# Patient Record
Sex: Female | Born: 1955 | Hispanic: No | Smoking: Never smoker
Health system: Southern US, Community
[De-identification: ages and names within clinical notes are randomized; demographics above are authoritative.]

## PROBLEM LIST (undated history)

## (undated) DIAGNOSIS — I671 Cerebral aneurysm, nonruptured: Secondary | ICD-10-CM

## (undated) DIAGNOSIS — Z86718 Personal history of other venous thrombosis and embolism: Secondary | ICD-10-CM

## (undated) DIAGNOSIS — I10 Essential (primary) hypertension: Secondary | ICD-10-CM

## (undated) DIAGNOSIS — Z7901 Long term (current) use of anticoagulants: Principal | ICD-10-CM

## (undated) DIAGNOSIS — D6859 Other primary thrombophilia: Secondary | ICD-10-CM

## (undated) DIAGNOSIS — E039 Hypothyroidism, unspecified: Secondary | ICD-10-CM

## (undated) DIAGNOSIS — G930 Cerebral cysts: Secondary | ICD-10-CM

## (undated) DIAGNOSIS — I639 Cerebral infarction, unspecified: Secondary | ICD-10-CM

## (undated) DIAGNOSIS — Z993 Dependence on wheelchair: Secondary | ICD-10-CM

## (undated) DIAGNOSIS — F32A Depression, unspecified: Secondary | ICD-10-CM

## (undated) DIAGNOSIS — F329 Major depressive disorder, single episode, unspecified: Secondary | ICD-10-CM

## (undated) HISTORY — PX: ABDOMINAL HYSTERECTOMY: SUR658

## (undated) HISTORY — DX: Other primary thrombophilia: D68.59

## (undated) HISTORY — DX: Long term (current) use of anticoagulants: Z79.01

## (undated) HISTORY — PX: BRAIN SURGERY: SHX531

## (undated) HISTORY — PX: TUBAL LIGATION: SHX77

## (undated) HISTORY — PX: OTHER SURGICAL HISTORY: SHX169

## (undated) HISTORY — DX: Hypothyroidism, unspecified: E03.9

## (undated) HISTORY — DX: Personal history of other venous thrombosis and embolism: Z86.718

## (undated) HISTORY — PX: THYROIDECTOMY: SHX17

---

## 1998-09-01 ENCOUNTER — Emergency Department (HOSPITAL_COMMUNITY): Admission: EM | Admit: 1998-09-01 | Discharge: 1998-09-01 | Payer: Self-pay | Admitting: Emergency Medicine

## 2003-09-05 ENCOUNTER — Inpatient Hospital Stay (HOSPITAL_COMMUNITY): Admission: RE | Admit: 2003-09-05 | Discharge: 2003-09-11 | Payer: Self-pay | Admitting: Family Medicine

## 2014-07-01 DIAGNOSIS — I639 Cerebral infarction, unspecified: Secondary | ICD-10-CM

## 2014-07-01 HISTORY — DX: Cerebral infarction, unspecified: I63.9

## 2014-11-28 ENCOUNTER — Emergency Department
Admit: 2014-11-28 | Payer: BLUE CROSS/BLUE SHIELD | Primary: Student in an Organized Health Care Education/Training Program

## 2014-11-28 ENCOUNTER — Inpatient Hospital Stay
Admit: 2014-11-28 | Payer: BLUE CROSS/BLUE SHIELD | Primary: Student in an Organized Health Care Education/Training Program

## 2014-11-28 ENCOUNTER — Inpatient Hospital Stay
Admit: 2014-11-28 | Discharge: 2014-12-12 | Disposition: A | Discharge status: Rehabilitation Facility | Payer: BLUE CROSS/BLUE SHIELD | Attending: Vascular Neurology | Admitting: Vascular Neurology

## 2014-11-28 ENCOUNTER — Inpatient Hospital Stay
Admit: 2014-11-28 | Discharge: 2014-11-28 | Disposition: A | Discharge status: Other Acute Inpatient Hospital | Payer: BLUE CROSS/BLUE SHIELD | Attending: Emergency Medicine

## 2014-11-28 DIAGNOSIS — I609 Nontraumatic subarachnoid hemorrhage, unspecified: Secondary | ICD-10-CM

## 2014-11-28 DIAGNOSIS — I6052 Nontraumatic subarachnoid hemorrhage from left vertebral artery: Principal | ICD-10-CM

## 2014-11-28 LAB — BLOOD GAS, ARTERIAL POC
Base deficit (POC): 1 mmol/L
Base deficit (POC): 6 mmol/L
Base deficit (POC): 6 mmol/L
FIO2 (POC): 100 %
FIO2 (POC): 100 %
FIO2 (POC): 60 %
HCO3 (POC): 24.7 MMOL/L (ref 22–26)
HCO3 (POC): 18.8 MMOL/L — ABNORMAL LOW (ref 22–26)
HCO3 (POC): 19.6 MMOL/L — ABNORMAL LOW (ref 22–26)
Inspiratory Time: 1 s
Inspiratory Time: 0.8 s
Inspiratory Time: 1 s
PEEP/CPAP (POC): 5 cmH2O
PEEP/CPAP (POC): 10 cmH2O
PEEP/CPAP (POC): 12 cmH2O
PIP (POC): 21
Patient temp.: 37.2
Patient temp.: 38.1
Pressure support: 10 cmH2O
Pressure support: 15 cmH2O
Set Rate: 12 {beats}/min
Set Rate: 13 {beats}/min
Set Rate: 30 {beats}/min
Tidal volume: 500 ml
Tidal volume: 500 ml
Tidal volume: 500 ml
Total resp. rate: 16
Total resp. rate: 28
Total resp. rate: 29
pCO2 (POC): 45.7 MMHG — ABNORMAL HIGH (ref 35.0–45.0)
pCO2 (POC): 31.7 MMHG — ABNORMAL LOW (ref 35.0–45.0)
pCO2 (POC): 34.6 MMHG — ABNORMAL LOW (ref 35.0–45.0)
pH (POC): 7.341 — ABNORMAL LOW (ref 7.35–7.45)
pH (POC): 7.362 (ref 7.35–7.45)
pH (POC): 7.385 (ref 7.35–7.45)
pO2 (POC): 139 MMHG — ABNORMAL HIGH (ref 80–100)
pO2 (POC): 204 MMHG — ABNORMAL HIGH (ref 80–100)
pO2 (POC): 67 MMHG — ABNORMAL LOW (ref 80–100)
sO2 (POC): 99 % — ABNORMAL HIGH (ref 92–97)
sO2 (POC): 100 % — ABNORMAL HIGH (ref 92–97)
sO2 (POC): 92 % (ref 92–97)

## 2014-11-28 LAB — URINE MICROSCOPIC ONLY
RBC: 0 /hpf (ref 0–5)
WBC: 21 /hpf (ref 0–4)

## 2014-11-28 LAB — CBC WITH AUTOMATED DIFF
ABS. BASOPHILS: 0 10*3/uL (ref 0.0–0.06)
ABS. BASOPHILS: 0 10*3/uL (ref 0.0–0.06)
ABS. EOSINOPHILS: 0.1 10*3/uL (ref 0.0–0.4)
ABS. EOSINOPHILS: 0 10*3/uL (ref 0.0–0.4)
ABS. LYMPHOCYTES: 3 10*3/uL (ref 0.9–3.6)
ABS. LYMPHOCYTES: 1.4 10*3/uL (ref 0.9–3.6)
ABS. MONOCYTES: 1.1 10*3/uL (ref 0.05–1.2)
ABS. MONOCYTES: 0.6 10*3/uL (ref 0.05–1.2)
ABS. NEUTROPHILS: 7.3 10*3/uL (ref 1.8–8.0)
ABS. NEUTROPHILS: 11.8 10*3/uL — ABNORMAL HIGH (ref 1.8–8.0)
BASOPHILS: 0 % (ref 0–2)
BASOPHILS: 0 % (ref 0–2)
EOSINOPHILS: 1 % (ref 0–5)
EOSINOPHILS: 0 % (ref 0–5)
HCT: 35.4 % (ref 35.0–45.0)
HCT: 34.3 % — ABNORMAL LOW (ref 35.0–45.0)
HGB: 11.5 g/dL — ABNORMAL LOW (ref 12.0–16.0)
HGB: 11 g/dL — ABNORMAL LOW (ref 12.0–16.0)
LYMPHOCYTES: 26 % (ref 21–52)
LYMPHOCYTES: 10 % — ABNORMAL LOW (ref 21–52)
MCH: 27.9 PG (ref 24.0–34.0)
MCH: 27.9 PG (ref 24.0–34.0)
MCHC: 32.5 g/dL (ref 31.0–37.0)
MCHC: 32.1 g/dL (ref 31.0–37.0)
MCV: 85.9 FL (ref 74.0–97.0)
MCV: 87.1 FL (ref 74.0–97.0)
MONOCYTES: 10 % (ref 3–10)
MONOCYTES: 4 % (ref 3–10)
MPV: 10.4 FL (ref 9.2–11.8)
MPV: 9.8 FL (ref 9.2–11.8)
NEUTROPHILS: 63 % (ref 40–73)
NEUTROPHILS: 86 % — ABNORMAL HIGH (ref 40–73)
PLATELET: 222 10*3/uL (ref 135–420)
PLATELET: 193 10*3/uL (ref 135–420)
RBC: 4.12 M/uL — ABNORMAL LOW (ref 4.20–5.30)
RBC: 3.94 M/uL — ABNORMAL LOW (ref 4.20–5.30)
RDW: 14.2 % (ref 11.6–14.5)
RDW: 14.6 % — ABNORMAL HIGH (ref 11.6–14.5)
WBC: 11.5 10*3/uL (ref 4.6–13.2)
WBC: 13.9 10*3/uL — ABNORMAL HIGH (ref 4.6–13.2)

## 2014-11-28 LAB — NT-PRO BNP: NT pro-BNP: 369 PG/ML (ref 0–900)

## 2014-11-28 LAB — RPR
RPR: NONREACTIVE
RPR: NONREACTIVE

## 2014-11-28 LAB — URINALYSIS W/ RFLX MICROSCOPIC
Bilirubin: NEGATIVE
Glucose: 250 mg/dL — AB
Ketone: NEGATIVE mg/dL
Nitrites: NEGATIVE
Protein: NEGATIVE mg/dL
Specific gravity: 1.03 (ref 1.005–1.030)
Urobilinogen: 0.2 EU/dL (ref 0.2–1.0)
pH (UA): 5 (ref 5.0–8.0)

## 2014-11-28 LAB — METABOLIC PANEL, COMPREHENSIVE
A-G Ratio: 0.7 — ABNORMAL LOW (ref 0.8–1.7)
ALT (SGPT): 26 U/L (ref 13–56)
AST (SGOT): 31 U/L (ref 15–37)
Albumin: 2.9 g/dL — ABNORMAL LOW (ref 3.4–5.0)
Alk. phosphatase: 120 U/L — ABNORMAL HIGH (ref 45–117)
Anion gap: 9 mmol/L (ref 3.0–18)
BUN/Creatinine ratio: 17 (ref 12–20)
BUN: 15 MG/DL (ref 7.0–18)
Bilirubin, total: 0.2 MG/DL (ref 0.2–1.0)
CO2: 23 mmol/L (ref 21–32)
Calcium: 7.2 MG/DL — ABNORMAL LOW (ref 8.5–10.1)
Chloride: 108 mmol/L (ref 100–108)
Creatinine: 0.89 MG/DL (ref 0.6–1.3)
GFR est AA: >60 mL/min/{1.73_m2} (ref >60)
GFR est non-AA: >60 mL/min/{1.73_m2} (ref >60)
Globulin: 4 g/dL (ref 2.0–4.0)
Glucose: 207 mg/dL — ABNORMAL HIGH (ref 74–99)
Potassium: 2.6 mmol/L — CL (ref 3.5–5.5)
Protein, total: 6.9 g/dL (ref 6.4–8.2)
Sodium: 140 mmol/L (ref 136–145)

## 2014-11-28 LAB — METABOLIC PANEL, BASIC
Anion gap: 10 mmol/L (ref 3.0–18)
BUN/Creatinine ratio: 18 (ref 12–20)
BUN: 21 MG/DL — ABNORMAL HIGH (ref 7.0–18)
CO2: 27 mmol/L (ref 21–32)
Calcium: 7.8 MG/DL — ABNORMAL LOW (ref 8.5–10.1)
Chloride: 106 mmol/L (ref 100–108)
Creatinine: 1.18 MG/DL (ref 0.6–1.3)
GFR est AA: 56 mL/min/{1.73_m2} — ABNORMAL LOW (ref >60)
GFR est non-AA: 47 mL/min/{1.73_m2} — ABNORMAL LOW (ref >60)
Glucose: 184 mg/dL — ABNORMAL HIGH (ref 74–99)
Potassium: 2.9 mmol/L — CL (ref 3.5–5.5)
Sodium: 143 mmol/L (ref 136–145)

## 2014-11-28 LAB — T3, FREE: Triiodothyronine (T3), free: 3.1 PG/ML (ref 2.3–4.2)

## 2014-11-28 LAB — CARDIAC PANEL,(CK, CKMB & TROPONIN)
CK - MB: 1.6 ng/ml (ref 0.5–3.6)
CK - MB: 5.4 ng/ml — ABNORMAL HIGH (ref 0.5–3.6)
CK - MB: 9.9 ng/ml — ABNORMAL HIGH (ref 0.5–3.6)
CK-MB Index: 0.8 % (ref 0.0–4.0)
CK-MB Index: 2.2 % (ref 0.0–4.0)
CK-MB Index: 2.7 % (ref 0.0–4.0)
CK: 191 U/L (ref 26–192)
CK: 244 U/L — ABNORMAL HIGH (ref 26–192)
CK: 368 U/L — ABNORMAL HIGH (ref 26–192)
Troponin-I, QT: <0.02 NG/ML (ref 0.00–0.06)
Troponin-I, QT: 0.1 NG/ML — ABNORMAL HIGH (ref 0.0–0.045)
Troponin-I, QT: 0.34 NG/ML — ABNORMAL HIGH (ref 0.0–0.045)

## 2014-11-28 LAB — PTT: aPTT: 39.9 s — ABNORMAL HIGH (ref 24.6–37.7)

## 2014-11-28 LAB — DRUG SCREEN, URINE
AMPHETAMINES: NEGATIVE
BARBITURATES: NEGATIVE
BENZODIAZEPINES: POSITIVE — AB
COCAINE: NEGATIVE
METHADONE: NEGATIVE
OPIATES: NEGATIVE
PCP(PHENCYCLIDINE): NEGATIVE
THC (TH-CANNABINOL): NEGATIVE

## 2014-11-28 LAB — AMYLASE: Amylase: 53 U/L (ref 25–115)

## 2014-11-28 LAB — AMMONIA: Ammonia, plasma: 31 umol/L (ref 11–32)

## 2014-11-28 LAB — LIPID PANEL
CHOL/HDL Ratio: 3.1 (ref 0–5.0)
Cholesterol, total: 110 MG/DL (ref <200)
HDL Cholesterol: 35 MG/DL — ABNORMAL LOW (ref 40–60)
LDL, calculated: 55.8 MG/DL (ref 0–100)
Triglyceride: 96 MG/DL (ref <150)
VLDL, calculated: 19.2 MG/DL

## 2014-11-28 LAB — CORTISOL
Cortisol, random: 16 ug/dL (ref 3.09–22.40)
Cortisol, random: 22.1 ug/dL (ref 3.09–22.40)
Cortisol, random: 21.6 ug/dL (ref 3.09–22.40)
Cortisol, random: 24 ug/dL — ABNORMAL HIGH (ref 3.09–22.40)

## 2014-11-28 LAB — HGB & HCT
HCT: 34.1 % — ABNORMAL LOW (ref 35.0–45.0)
HGB: 11.2 g/dL — ABNORMAL LOW (ref 12.0–16.0)

## 2014-11-28 LAB — HEPATIC FUNCTION PANEL
A-G Ratio: 0.7 — ABNORMAL LOW (ref 0.8–1.7)
ALT (SGPT): 25 U/L (ref 13–56)
AST (SGOT): 33 U/L (ref 15–37)
Albumin: 2.8 g/dL — ABNORMAL LOW (ref 3.4–5.0)
Alk. phosphatase: 122 U/L — ABNORMAL HIGH (ref 45–117)
Bilirubin, direct: 0.2 MG/DL (ref 0.0–0.2)
Bilirubin, total: 0.2 MG/DL (ref 0.2–1.0)
Globulin: 4.3 g/dL — ABNORMAL HIGH (ref 2.0–4.0)
Protein, total: 7.1 g/dL (ref 6.4–8.2)

## 2014-11-28 LAB — EKG, 12 LEAD, INITIAL
Atrial Rate: 77 {beats}/min
Calculated P Axis: 53 degrees
Calculated R Axis: 62 degrees
Calculated T Axis: 68 degrees
Diagnosis: NORMAL
P-R Interval: 208 ms
Q-T Interval: 450 ms
QRS Duration: 110 ms
QTC Calculation (Bezet): 509 ms
Ventricular Rate: 77 {beats}/min

## 2014-11-28 LAB — PROTHROMBIN TIME + INR
INR: 2.3 — ABNORMAL HIGH (ref 0.8–1.2)
INR: 1 (ref 0.8–1.2)
INR: 1.1 (ref 0.8–1.2)
Prothrombin time: 25.6 s — ABNORMAL HIGH (ref 11.5–15.2)
Prothrombin time: 13.4 s (ref 11.5–15.2)
Prothrombin time: 14.6 s (ref 11.5–15.2)

## 2014-11-28 LAB — POC ACTIVATED CLOTTING TIME: Activated Clotting Time (POC): 116 SECS (ref 79–138)

## 2014-11-28 LAB — PHOSPHORUS: Phosphorus: 2.6 MG/DL (ref 2.5–4.9)

## 2014-11-28 LAB — MAGNESIUM: Magnesium: 2.2 mg/dL (ref 1.8–2.4)

## 2014-11-28 LAB — T4, FREE: T4, Free: 1.2 NG/DL (ref 0.7–1.5)

## 2014-11-28 LAB — SED RATE (ESR): Sed rate, automated: 60 mm/hr — ABNORMAL HIGH (ref 0–30)

## 2014-11-28 LAB — HEMOGLOBIN A1C WITH EAG
Est. average glucose: 137 mg/dL
Hemoglobin A1c: 6.4 % — ABNORMAL HIGH (ref 4.2–5.6)

## 2014-11-28 LAB — LIPASE: Lipase: 154 U/L (ref 73–393)

## 2014-11-28 LAB — GLUCOSE, POC: Glucose (POC): 143 mg/dL — ABNORMAL HIGH (ref 70–110)

## 2014-11-28 LAB — ASPIRIN TEST: Aspirin test: 658 {ARU}

## 2014-11-28 LAB — TSH 3RD GENERATION: TSH: 0.04 u[IU]/mL — ABNORMAL LOW (ref 0.36–3.74)

## 2014-11-28 MED ORDER — LEVETIRACETAM 500 MG/5 ML IV SOLN
500 mg/5 mL | INTRAVENOUS | Status: DC
Start: 2014-11-28 — End: 2014-11-28
  Administered 2014-11-28: 08:00:00

## 2014-11-28 MED ORDER — ALBUTEROL SULFATE 0.083 % (0.83 MG/ML) SOLN FOR INHALATION
2.5 mg /3 mL (0.083 %) | RESPIRATORY_TRACT | Status: DC | PRN
Start: 2014-11-28 — End: 2014-12-12
  Administered 2014-11-28: 21:00:00 via RESPIRATORY_TRACT

## 2014-11-28 MED ORDER — PHYTONADIONE 10 MG/ML INJECTION
10 mg/mL | Freq: Once | INTRAMUSCULAR | Status: DC
Start: 2014-11-28 — End: 2014-11-28

## 2014-11-28 MED ORDER — SODIUM CHLORIDE 0.9 % IV
10 mg/mL | Freq: Once | INTRAVENOUS | Status: AC
Start: 2014-11-28 — End: 2014-11-28
  Administered 2014-11-28: 08:00:00 via INTRAVENOUS

## 2014-11-28 MED ORDER — SODIUM CHLORIDE 0.9 % IV
INTRAVENOUS | Status: DC | PRN
Start: 2014-11-28 — End: 2014-11-28
  Administered 2014-11-28: 09:00:00 via INTRAVENOUS

## 2014-11-28 MED ORDER — VECURONIUM BROMIDE 20 MG IV SOLUTION
20 mg | INTRAVENOUS | Status: AC
Start: 2014-11-28 — End: 2014-11-28
  Administered 2014-11-28: 07:00:00 via INTRAVENOUS

## 2014-11-28 MED ORDER — ACETAMINOPHEN 325 MG TABLET
325 mg | ORAL | Status: DC | PRN
Start: 2014-11-28 — End: 2014-11-30
  Administered 2014-11-29: 04:00:00 via ORAL

## 2014-11-28 MED ORDER — LABETALOL 5 MG/ML IV SYRINGE
20 mg/4 mL (5 mg/mL) | INTRAVENOUS | Status: DC | PRN
Start: 2014-11-28 — End: 2014-11-28

## 2014-11-28 MED ORDER — PHYTONADIONE 10 MG/ML INJECTION
10 mg/mL | INTRAMUSCULAR | Status: DC
Start: 2014-11-28 — End: 2014-11-28
  Administered 2014-11-28: 08:00:00

## 2014-11-28 MED ORDER — PHYTONADIONE 10 MG/ML INJECTION
10 mg/mL | INTRAMUSCULAR | Status: DC
Start: 2014-11-28 — End: 2014-11-28

## 2014-11-28 MED ORDER — ELECTROLYTE REPLACEMENT PROTOCOL
Freq: Three times a day (TID) | Status: DC
Start: 2014-11-28 — End: 2014-12-09
  Administered 2014-11-29 – 2014-12-08 (×26)

## 2014-11-28 MED ORDER — NICARDIPINE 2.5 MG/ML IV
25 mg/10 mL | INTRAVENOUS | Status: DC
Start: 2014-11-28 — End: 2014-11-28

## 2014-11-28 MED ORDER — MIDAZOLAM 1 MG/ML IN NS IV INFUSION
1 mg/mL | INTRAVENOUS | Status: DC
Start: 2014-11-28 — End: 2014-11-28
  Administered 2014-11-28: 06:00:00 via INTRAVENOUS

## 2014-11-28 MED ORDER — HEPARIN 3,000 UNITS IN NORMAL SALINE 1000 ML
3000 units/1000 | INTRAVENOUS | Status: DC
Start: 2014-11-28 — End: 2014-11-28

## 2014-11-28 MED ORDER — BACITRACIN 500 UNIT/G TOPICAL PACKET
500 unit/gram | CUTANEOUS | Status: DC | PRN
Start: 2014-11-28 — End: 2014-12-12

## 2014-11-28 MED ORDER — HEPARIN 3,000 UNITS IN NORMAL SALINE 1000 ML
3000 units/1000 | Freq: Once | INTRAVENOUS | Status: AC
Start: 2014-11-28 — End: 2014-11-28
  Administered 2014-11-28: 08:00:00

## 2014-11-28 MED ORDER — BISACODYL 5 MG TAB, DELAYED RELEASE
5 mg | Freq: Every day | ORAL | Status: DC | PRN
Start: 2014-11-28 — End: 2014-12-07

## 2014-11-28 MED ORDER — BACITRACIN 500 UNIT/G TOPICAL PACKET
500 unit/gram | CUTANEOUS | Status: AC
Start: 2014-11-28 — End: 2014-11-28
  Administered 2014-11-28: 15:00:00

## 2014-11-28 MED ORDER — ALBUTEROL SULFATE HFA 90 MCG/ACTUATION AEROSOL INHALER
90 mcg/actuation | RESPIRATORY_TRACT | Status: DC | PRN
Start: 2014-11-28 — End: 2014-11-28
  Administered 2014-11-28: 10:00:00 via RESPIRATORY_TRACT

## 2014-11-28 MED ORDER — ACETAMINOPHEN 650 MG RECTAL SUPPOSITORY
650 mg | RECTAL | Status: DC | PRN
Start: 2014-11-28 — End: 2014-12-12
  Administered 2014-11-28 – 2014-12-02 (×4): via RECTAL

## 2014-11-28 MED ORDER — DEXMEDETOMIDINE 100 MCG/ML IV SOLN
100 mcg/mL | INTRAVENOUS | Status: DC
Start: 2014-11-28 — End: 2014-12-02
  Administered 2014-11-28 – 2014-11-30 (×10): via INTRAVENOUS

## 2014-11-28 MED ORDER — SODIUM CHLORIDE 0.9 % IV
INTRAVENOUS | Status: DC
Start: 2014-11-28 — End: 2014-12-08
  Administered 2014-11-28: 16:00:00 via INTRAVENOUS

## 2014-11-28 MED ORDER — ONDANSETRON (PF) 4 MG/2 ML INJECTION
4 mg/2 mL | Freq: Four times a day (QID) | INTRAMUSCULAR | Status: DC | PRN
Start: 2014-11-28 — End: 2014-12-12

## 2014-11-28 MED ORDER — ALBUTEROL SULFATE 0.083 % (0.83 MG/ML) SOLN FOR INHALATION
2.5 mg /3 mL (0.083 %) | RESPIRATORY_TRACT | Status: DC
Start: 2014-11-28 — End: 2014-11-28

## 2014-11-28 MED ORDER — SENNOSIDES-DOCUSATE SODIUM 8.6 MG-50 MG TAB
Freq: Every evening | ORAL | Status: DC
Start: 2014-11-28 — End: 2014-12-07
  Administered 2014-11-29 – 2014-12-07 (×9): via ORAL

## 2014-11-28 MED ORDER — IODIXANOL 270 MG/ML IV SOLN
270 mg iodine/mL | Freq: Once | INTRAVENOUS | Status: AC
Start: 2014-11-28 — End: 2014-11-28
  Administered 2014-11-28: 08:00:00 via INTRA_ARTERIAL

## 2014-11-28 MED ORDER — LEVETIRACETAM IN SOD CHLORIDE (ISO-OSM) 500 MG/100 ML IV PIGGY BACK
500 mg/100 mL | Freq: Two times a day (BID) | INTRAVENOUS | Status: DC
Start: 2014-11-28 — End: 2014-11-28
  Administered 2014-11-28: 06:00:00 via INTRAVENOUS

## 2014-11-28 MED ORDER — SODIUM CHLORIDE 0.9 % IV
100 mg/mL | Freq: Every day | INTRAVENOUS | Status: DC
Start: 2014-11-28 — End: 2014-12-02
  Administered 2014-11-28 – 2014-12-02 (×5): via INTRAVENOUS

## 2014-11-28 MED ORDER — POTASSIUM CHLORIDE 20 MEQ/50 ML IV PIGGY BACK
20 mEq/50 mL | INTRAVENOUS | Status: DC
Start: 2014-11-28 — End: 2014-11-28
  Administered 2014-11-28: 12:00:00

## 2014-11-28 MED ORDER — MAGNESIUM SULFATE 2 GRAM/50 ML IVPB
2 gram/50 mL (4 %) | Freq: Two times a day (BID) | INTRAVENOUS | Status: DC
Start: 2014-11-28 — End: 2014-12-12
  Administered 2014-11-28 – 2014-12-12 (×18): via INTRAVENOUS

## 2014-11-28 MED ORDER — NIMODIPINE 30 MG CAP
30 mg | ORAL | Status: DC
Start: 2014-11-28 — End: 2014-11-29
  Administered 2014-11-29 (×2): via ORAL

## 2014-11-28 MED ORDER — IOPAMIDOL 61 % IV SOLN
300 mg iodine /mL (61 %) | Freq: Once | INTRAVENOUS | Status: AC
Start: 2014-11-28 — End: 2014-11-28
  Administered 2014-11-28: 08:00:00 via INTRAVENOUS

## 2014-11-28 MED ORDER — LIDOCAINE HCL 2 % (20 MG/ML) IJ SOLN
20 mg/mL (2 %) | INTRAMUSCULAR | Status: DC | PRN
Start: 2014-11-28 — End: 2014-11-28
  Administered 2014-11-28: 10:00:00 via SUBCUTANEOUS

## 2014-11-28 MED ORDER — VERAPAMIL 2.5 MG/ML IV
2.5 mg/mL | INTRAVENOUS | Status: DC | PRN
Start: 2014-11-28 — End: 2014-11-28

## 2014-11-28 MED ORDER — SODIUM CHLORIDE 0.9 % INJECTION
40 mg | Freq: Two times a day (BID) | INTRAMUSCULAR | Status: DC
Start: 2014-11-28 — End: 2014-12-02
  Administered 2014-11-28 – 2014-12-02 (×8): via INTRAVENOUS

## 2014-11-28 MED ORDER — SODIUM CHLORIDE 0.9 % IV
500 mg/5 mL | Freq: Two times a day (BID) | INTRAVENOUS | Status: DC
Start: 2014-11-28 — End: 2014-11-29
  Administered 2014-11-28 – 2014-11-29 (×3): via INTRAVENOUS

## 2014-11-28 MED ORDER — MAGNESIUM SULFATE 2 GRAM/50 ML IVPB
2 gram/50 mL (4 %) | INTRAVENOUS | Status: DC | PRN
Start: 2014-11-28 — End: 2014-11-28
  Administered 2014-11-28: 12:00:00 via INTRAVENOUS

## 2014-11-28 MED ORDER — PHARMACY INFORMATION NOTE
Freq: Every day | Status: DC
Start: 2014-11-28 — End: 2014-12-10
  Administered 2014-11-28 – 2014-12-08 (×11)

## 2014-11-28 MED ORDER — FUROSEMIDE 10 MG/ML IJ SOLN
10 mg/mL | INTRAMUSCULAR | Status: DC | PRN
Start: 2014-11-28 — End: 2014-11-28
  Administered 2014-11-28: 10:00:00 via INTRAVENOUS

## 2014-11-28 MED ORDER — LEVETIRACETAM IN SOD CHLORIDE (ISO-OSM) 500 MG/100 ML IV PIGGY BACK
500 mg/100 mL | INTRAVENOUS | Status: DC
Start: 2014-11-28 — End: 2014-11-28

## 2014-11-28 MED ORDER — ROCURONIUM 10 MG/ML IV
10 mg/mL | INTRAVENOUS | Status: DC | PRN
Start: 2014-11-28 — End: 2014-11-28
  Administered 2014-11-28 (×5): via INTRAVENOUS

## 2014-11-28 MED ORDER — IODIXANOL 270 MG/ML IV SOLN
270 mg iodine/mL | Freq: Once | INTRAVENOUS | Status: AC
Start: 2014-11-28 — End: 2014-11-28
  Administered 2014-11-28: 13:00:00 via INTRA_ARTERIAL

## 2014-11-28 MED ORDER — D5-NS WITH POTASSIUM CHLORIDE 40 MEQ/L IV
40 mEq/L | INTRAVENOUS | Status: DC
Start: 2014-11-28 — End: 2014-11-29
  Administered 2014-11-28 – 2014-11-29 (×2): via INTRAVENOUS

## 2014-11-28 MED ORDER — ELECTROLYTE REPLACEMENT PROTOCOL
Freq: Three times a day (TID) | Status: DC
Start: 2014-11-28 — End: 2014-12-09
  Administered 2014-11-29 – 2014-12-08 (×26)

## 2014-11-28 MED ORDER — MIDAZOLAM 1 MG/ML IJ SOLN
1 mg/mL | INTRAMUSCULAR | Status: DC
Start: 2014-11-28 — End: 2014-11-28

## 2014-11-28 MED ORDER — DEXTROSE 5% IN NORMAL SALINE IV
INTRAVENOUS | Status: DC
Start: 2014-11-28 — End: 2014-11-28
  Administered 2014-11-28: 16:00:00 via INTRAVENOUS

## 2014-11-28 MED ORDER — SUCCINYLCHOLINE CHLORIDE 20 MG/ML INJECTION
20 mg/mL | INTRAMUSCULAR | Status: DC
Start: 2014-11-28 — End: 2014-11-28

## 2014-11-28 MED ORDER — IOPAMIDOL 51 % IV SOLN
250 mg iodine /mL (51 %) | Freq: Once | INTRAVENOUS | Status: DC
Start: 2014-11-28 — End: 2014-11-28

## 2014-11-28 MED ORDER — HEPARIN 3,000 UNITS IN NORMAL SALINE 1000 ML
3000 units/1000 | Freq: Once | INTRAVENOUS | Status: AC
Start: 2014-11-28 — End: 2014-11-28
  Administered 2014-11-28: 08:00:00 via INTRAVENOUS

## 2014-11-28 MED ORDER — SODIUM CHLORIDE 0.9 % IJ SYRG
INTRAMUSCULAR | Status: DC | PRN
Start: 2014-11-28 — End: 2014-12-12
  Administered 2014-12-10: 15:00:00

## 2014-11-28 MED ORDER — SODIUM CHLORIDE 0.9 % IV
INTRAVENOUS | Status: DC | PRN
Start: 2014-11-28 — End: 2014-11-28
  Administered 2014-11-28: 12:00:00 via INTRAVENOUS

## 2014-11-28 MED ORDER — ELECTROLYTE REPLACEMENT PROTOCOL
Freq: Three times a day (TID) | Status: DC
Start: 2014-11-28 — End: 2014-12-09
  Administered 2014-11-29 – 2014-12-08 (×26)

## 2014-11-28 MED ORDER — HUM PROTHROMBIN CPLX(PCC)4FACT 500 UNIT (400-620 UNIT) INTRAVENOUS KIT
500 unit (400-620 unit) | Freq: Once | INTRAVENOUS | Status: AC
Start: 2014-11-28 — End: 2014-11-28
  Administered 2014-11-28: 07:00:00 via INTRAVENOUS

## 2014-11-28 MED ORDER — NICARDIPINE IN SODIUM CHLORIDE(ISO-OSMOTIC) 20 MG/200 ML IV PIGGY BACK
INTRAVENOUS | Status: DC | PRN
Start: 2014-11-28 — End: 2014-11-28
  Administered 2014-11-28 (×4): via INTRAVENOUS

## 2014-11-28 MED ORDER — HYDRALAZINE 20 MG/ML IJ SOLN
20 mg/mL | INTRAMUSCULAR | Status: DC
Start: 2014-11-28 — End: 2014-11-28

## 2014-11-28 MED ORDER — CEFAZOLIN 1 GRAM SOLUTION FOR INJECTION
1 gram | INTRAMUSCULAR | Status: DC | PRN
Start: 2014-11-28 — End: 2014-11-28
  Administered 2014-11-28 (×2): via INTRAVENOUS

## 2014-11-28 MED ORDER — CHLORHEXIDINE GLUCONATE 0.12 % MOUTHWASH
0.12 % | Freq: Two times a day (BID) | Status: DC
Start: 2014-11-28 — End: 2014-12-02
  Administered 2014-11-28 – 2014-12-01 (×7): via ORAL

## 2014-11-28 MED ORDER — MAGNESIUM SULFATE 50 % (4 MEQ/ML) INJECTION
4 mEq/mL (50 %) | INTRAMUSCULAR | Status: DC
Start: 2014-11-28 — End: 2014-11-28
  Administered 2014-11-28: 12:00:00

## 2014-11-28 MED ORDER — HYDRALAZINE 20 MG/ML IJ SOLN
20 mg/mL | Freq: Once | INTRAMUSCULAR | Status: AC
Start: 2014-11-28 — End: 2014-11-28
  Administered 2014-11-28: 05:00:00 via INTRAVENOUS

## 2014-11-28 MED ORDER — VECURONIUM BROMIDE 20 MG IV SOLUTION
20 mg | Freq: Once | INTRAVENOUS | Status: AC
Start: 2014-11-28 — End: 2014-11-28

## 2014-11-28 MED ORDER — LEVETIRACETAM IN SOD CHLORIDE (ISO-OSM) 1,000 MG/100 ML IV INFUSION
1000 mg/100 mL | Freq: Once | INTRAVENOUS | Status: AC
Start: 2014-11-28 — End: 2014-11-28
  Administered 2014-11-28: 08:00:00 via INTRAVENOUS

## 2014-11-28 MED ORDER — BISACODYL 10 MG RECTAL SUPPOSITORY
10 mg | Freq: Every day | RECTAL | Status: DC | PRN
Start: 2014-11-28 — End: 2014-12-07

## 2014-11-28 MED ORDER — ETOMIDATE 2 MG/ML IV SOLN
2 mg/mL | Freq: Once | INTRAVENOUS | Status: DC
Start: 2014-11-28 — End: 2014-11-28

## 2014-11-28 MED ORDER — POTASSIUM CHLORIDE 20 MEQ/50 ML IV PIGGY BACK
20 mEq/50 mL | INTRAVENOUS | Status: DC
Start: 2014-11-28 — End: 2014-11-28
  Administered 2014-11-28 (×4): via INTRAVENOUS

## 2014-11-28 MED ORDER — NICARDIPINE 2.5 MG/ML IV
2510 mg/10 mL | INTRAVENOUS | Status: DC
Start: 2014-11-28 — End: 2014-11-29
  Administered 2014-11-28: 16:00:00 via INTRAVENOUS

## 2014-11-28 MED ORDER — PROPOFOL INFUSION
10 mg/mL | INTRAVENOUS | Status: DC
Start: 2014-11-28 — End: 2014-11-28
  Administered 2014-11-28: 04:00:00 via INTRAVENOUS

## 2014-11-28 MED ORDER — NICARDIPINE 2.5 MG/ML IV
25 mg/10 mL | INTRAVENOUS | Status: DC
Start: 2014-11-28 — End: 2014-11-28
  Administered 2014-11-28 (×2): via INTRAVENOUS

## 2014-11-28 MED ORDER — HEPARIN 3,000 UNITS IN NORMAL SALINE 1000 ML
3000 units/1000 | INTRAVENOUS | Status: DC
Start: 2014-11-28 — End: 2014-11-28
  Administered 2014-11-28: 08:00:00 via INTRA_ARTERIAL

## 2014-11-28 MED ORDER — BACITRACIN 500 UNIT/G TOPICAL PACKET
500 unit/gram | CUTANEOUS | Status: DC
Start: 2014-11-28 — End: 2014-11-28

## 2014-11-28 MED ORDER — HYDRALAZINE 20 MG/ML IJ SOLN
20 mg/mL | Freq: Once | INTRAMUSCULAR | Status: AC
Start: 2014-11-28 — End: 2014-11-28
  Administered 2014-11-28: 08:00:00 via INTRAVENOUS

## 2014-11-28 MED ORDER — LABETALOL 5 MG/ML IV SOLN
5 mg/mL | INTRAVENOUS | Status: AC
Start: 2014-11-28 — End: 2014-11-28
  Administered 2014-11-28: 16:00:00

## 2014-11-28 MED ORDER — CALCIUM GLUCONATE 100 MG/ML (10%) IV SOLN
100 mg/mL (10%) | INTRAVENOUS | Status: DC
Start: 2014-11-28 — End: 2014-11-28

## 2014-11-28 MED ORDER — SODIUM CHLORIDE 0.9 % IJ SYRG
INTRAMUSCULAR | Status: DC | PRN
Start: 2014-11-28 — End: 2014-12-12

## 2014-11-28 MED ORDER — COSYNTROPIN 0.25 MG SOLUTION FOR INJECTION
0.25 mg | INTRAMUSCULAR | Status: AC
Start: 2014-11-28 — End: 2014-11-28
  Administered 2014-11-28: 21:00:00 via INTRAVENOUS

## 2014-11-28 MED ORDER — MIDAZOLAM 1 MG/ML IJ SOLN
1 mg/mL | INTRAMUSCULAR | Status: AC
Start: 2014-11-28 — End: 2014-11-28
  Administered 2014-11-28: 07:00:00 via INTRAVENOUS

## 2014-11-28 MED ORDER — LABETALOL 5 MG/ML IV SOLN
5 mg/mL | INTRAVENOUS | Status: DC | PRN
Start: 2014-11-28 — End: 2014-11-29

## 2014-11-28 MED ORDER — POTASSIUM & SODIUM PHOSPHATES 280 MG-160 MG-250 MG ORAL POWDER PACKET
280-160-250 mg | Freq: Once | ORAL | Status: AC
Start: 2014-11-28 — End: 2014-11-29

## 2014-11-28 MED ORDER — PROPOFOL INFUSION
10 mg/mL | INTRAVENOUS | Status: DC | PRN
Start: 2014-11-28 — End: 2014-11-28
  Administered 2014-11-28 (×2): via INTRAVENOUS

## 2014-11-28 MED ORDER — PHENYLEPHRINE 90 MG/250ML INFUSION
90 mg/ 250 ml | INTRAVENOUS | Status: DC
Start: 2014-11-28 — End: 2014-11-29
  Administered 2014-11-28 – 2014-11-29 (×21): via INTRAVENOUS

## 2014-11-28 MED ORDER — MAGNESIUM SULFATE 2 GRAM/50 ML IVPB
2 gram/50 mL (4 %) | Freq: Once | INTRAVENOUS | Status: DC
Start: 2014-11-28 — End: 2014-11-28
  Administered 2014-11-28: 12:00:00 via INTRAVENOUS

## 2014-11-28 MED ORDER — SODIUM CHLORIDE 0.9 % IJ SYRG
Freq: Three times a day (TID) | INTRAMUSCULAR | Status: DC
Start: 2014-11-28 — End: 2014-12-12
  Administered 2014-11-28 – 2014-12-12 (×41)

## 2014-11-28 MED ORDER — SODIUM CHLORIDE 0.9 % IV
10 mg/mL | INTRAVENOUS | Status: DC | PRN
Start: 2014-11-28 — End: 2014-11-28
  Administered 2014-11-28: 10:00:00 via INTRAVENOUS

## 2014-11-28 MED FILL — FOLIC ACID 5 MG/ML IJ SOLN: 5 mg/mL | INTRAMUSCULAR | Qty: 0.2

## 2014-11-28 MED FILL — LEVETIRACETAM 500 MG/5 ML IV SOLN: 500 mg/5 mL | INTRAVENOUS | Qty: 10

## 2014-11-28 MED FILL — PHARMACY INFORMATION NOTE: Qty: 1

## 2014-11-28 MED FILL — HYDRALAZINE 20 MG/ML IJ SOLN: 20 mg/mL | INTRAMUSCULAR | Qty: 1

## 2014-11-28 MED FILL — ELECTROLYTE REPLACEMENT PROTOCOL: Qty: 1

## 2014-11-28 MED FILL — CALCIUM GLUCONATE 100 MG/ML (10%) IV SOLN: 100 mg/mL (10%) | INTRAVENOUS | Qty: 20

## 2014-11-28 MED FILL — PROPOFOL 10 MG/ML IV EMUL: 10 mg/mL | INTRAVENOUS | Qty: 100

## 2014-11-28 MED FILL — ISOVUE-250  51 % INTRAVENOUS SOLUTION: 250 mg iodine /mL (51 %) | INTRAVENOUS | Qty: 50

## 2014-11-28 MED FILL — NICARDIPINE 2.5 MG/ML IV: 25 mg/10 mL | INTRAVENOUS | Qty: 10

## 2014-11-28 MED FILL — BACITRACIN 500 UNIT/G TOPICAL PACKET: 500 unit/gram | CUTANEOUS | Qty: 2

## 2014-11-28 MED FILL — MIDAZOLAM 1 MG/ML IN NS IV INFUSION: 1 mg/mL | INTRAVENOUS | Qty: 100

## 2014-11-28 MED FILL — HEPARIN 3,000 UNITS IN NORMAL SALINE 1000 ML: 3000 units/1000 | INTRAVENOUS | Qty: 1000

## 2014-11-28 MED FILL — SODIUM CHLORIDE 0.9 % IV: INTRAVENOUS | Qty: 1000

## 2014-11-28 MED FILL — ALBUTEROL SULFATE 0.083 % (0.83 MG/ML) SOLN FOR INHALATION: 2.5 mg /3 mL (0.083 %) | RESPIRATORY_TRACT | Qty: 1

## 2014-11-28 MED FILL — VITAMIN K1 10 MG/ML INJECTION SOLUTION: 10 mg/mL | INTRAMUSCULAR | Qty: 1

## 2014-11-28 MED FILL — LEVETIRACETAM 500 MG/5 ML IV SOLN: 500 mg/5 mL | INTRAVENOUS | Qty: 7.5

## 2014-11-28 MED FILL — POTASSIUM CHLORIDE 20 MEQ/50 ML IV PIGGY BACK: 20 mEq/50 mL | INTRAVENOUS | Qty: 150

## 2014-11-28 MED FILL — CORTROSYN 0.25 MG SOLUTION FOR INJECTION: 0.25 mg | INTRAMUSCULAR | Qty: 1

## 2014-11-28 MED FILL — SODIUM CHLORIDE 0.9 % INJECTION: INTRAMUSCULAR | Qty: 10

## 2014-11-28 MED FILL — ETOMIDATE 2 MG/ML IV SOLN: 2 mg/mL | INTRAVENOUS | Qty: 20

## 2014-11-28 MED FILL — D5-NS WITH POTASSIUM CHLORIDE 40 MEQ/L IV: 40 mEq/L | INTRAVENOUS | Qty: 1000

## 2014-11-28 MED FILL — BACITRACIN 500 UNIT/G TOPICAL PACKET: 500 unit/gram | CUTANEOUS | Qty: 1

## 2014-11-28 MED FILL — VISIPAQUE 270 MG IODINE/ML INTRAVENOUS SOLUTION: 270 mg iodine/mL | INTRAVENOUS | Qty: 200

## 2014-11-28 MED FILL — XYLOCAINE 20 MG/ML (2 %) INJECTION SOLUTION: 20 mg/mL (2 %) | INTRAMUSCULAR | Qty: 20

## 2014-11-28 MED FILL — CHLORHEXIDINE GLUCONATE 0.12 % MOUTHWASH: 0.12 % | Qty: 15

## 2014-11-28 MED FILL — LEVETIRACETAM IN SOD CHLORIDE (ISO-OSM) 500 MG/100 ML IV PIGGY BACK: 500 mg/100 mL | INTRAVENOUS | Qty: 100

## 2014-11-28 MED FILL — MAGNESIUM SULFATE 50 % (4 MEQ/ML) INJECTION: 4 mEq/mL (50 %) | INTRAMUSCULAR | Qty: 4

## 2014-11-28 MED FILL — ACEPHEN 650 MG RECTAL SUPPOSITORY: 650 mg | RECTAL | Qty: 1

## 2014-11-28 MED FILL — PRECEDEX 100 MCG/ML INTRAVENOUS SOLUTION: 100 mcg/mL | INTRAVENOUS | Qty: 4

## 2014-11-28 MED FILL — VISIPAQUE 270 MG IODINE/ML INTRAVENOUS SOLUTION: 270 mg iodine/mL | INTRAVENOUS | Qty: 100

## 2014-11-28 MED FILL — LABETALOL 5 MG/ML IV SOLN: 5 mg/mL | INTRAVENOUS | Qty: 20

## 2014-11-28 MED FILL — MIDAZOLAM 1 MG/ML IJ SOLN: 1 mg/mL | INTRAMUSCULAR | Qty: 2

## 2014-11-28 MED FILL — BD POSIFLUSH NORMAL SALINE 0.9 % INJECTION SYRINGE: INTRAMUSCULAR | Qty: 10

## 2014-11-28 MED FILL — VERAPAMIL 2.5 MG/ML IV: 2.5 mg/mL | INTRAVENOUS | Qty: 4

## 2014-11-28 MED FILL — MAGNESIUM SULFATE 2 GRAM/50 ML IVPB: 2 gram/50 mL (4 %) | INTRAVENOUS | Qty: 50

## 2014-11-28 MED FILL — VECURONIUM BROMIDE 20 MG IV SOLUTION: 20 mg | INTRAVENOUS | Qty: 20

## 2014-11-28 MED FILL — NIMODIPINE 30 MG CAP: 30 mg | ORAL | Qty: 2

## 2014-11-28 MED FILL — BD POSIFLUSH NORMAL SALINE 0.9 % INJECTION SYRINGE: INTRAMUSCULAR | Qty: 40

## 2014-11-28 MED FILL — LEVETIRACETAM IN SOD CHLORIDE (ISO-OSM) 1,000 MG/100 ML IV INFUSION: 1000 mg/100 mL | INTRAVENOUS | Qty: 100

## 2014-11-28 MED FILL — PHENYLEPHRINE 90 MG/250ML INFUSION: 90 mg/ 250 ml | INTRAVENOUS | Qty: 250

## 2014-11-28 MED FILL — BD POSIFLUSH NORMAL SALINE 0.9 % INJECTION SYRINGE: INTRAMUSCULAR | Qty: 30

## 2014-11-28 MED FILL — ISOVUE-300  61 % INTRAVENOUS SOLUTION: 300 mg iodine /mL (61 %) | INTRAVENOUS | Qty: 100

## 2014-11-28 MED FILL — SODIUM CHLORIDE 0.9 % IV: INTRAVENOUS | Qty: 250

## 2014-11-28 MED FILL — LABETALOL 5 MG/ML IV SYRINGE: 20 mg/4 mL (5 mg/mL) | INTRAVENOUS | Qty: 4

## 2014-11-28 MED FILL — MIDAZOLAM 1 MG/ML IJ SOLN: 1 mg/mL | INTRAMUSCULAR | Qty: 50

## 2014-11-28 MED FILL — DEXTROSE 5% IN NORMAL SALINE IV: INTRAVENOUS | Qty: 1000

## 2014-11-28 MED FILL — ANECTINE 20 MG/ML INJECTION SOLUTION: 20 mg/mL | INTRAMUSCULAR | Qty: 10

## 2014-11-28 MED FILL — PHOS-NAK 280 MG-160 MG-250 MG ORAL POWDER PACKET: 280-160-250 mg | ORAL | Qty: 2

## 2014-11-28 NOTE — Anesthesia Procedure Notes (Signed)
Arterial Line Placement    Start time: 11/28/2014 5:00 AM  End time: 11/28/2014 5:20 AM    Staffing  Performed by: anesthesiologist   Pre-procedure Checklist  7-step sterility protocol achieved  Timeout performed  Arterial Line Insertion  Attempts: 3 or more  Size: 20G  Location: right radialNo  Procedure Note  Arterial Line insertion    Operator:Simonne Boulos Mancel ParsonsM Arrianna Catala, MD    A time was out performed. The correct patient side, site, and procedure were verified. The left wrist was prepped with chlorhexidine.  The radial artery was punctured at 3 locations under ultrasound guidance and a catheter was unable to be threaded. After discussion of necessity with the surgeon, the right wrist was then prepped and a 20g catheter was placed over a wire into the radial artery on the first attempt.  A dressing was applied and the catheter was transduced.  There were no complications.      Kert Shackett Mancel ParsonsM Leia Coletti, MD

## 2014-11-28 NOTE — ED Notes (Signed)
Pt transported to neuro interventional suite.

## 2014-11-28 NOTE — Progress Notes (Signed)
OT order received and chart reviewed.  Talked with nursing and patient on vent and has LEs immobilized.  Will hold today and see patient tomorrow morning as appropriate.  Thank you for the referral.  Peggye FormAshley DaVanzo, MS OTR/L

## 2014-11-28 NOTE — ED Notes (Signed)
Pt moving bilateral arms and bilateral legs. Provider made aware and another dose of versed ordered to improve sedation.

## 2014-11-28 NOTE — Anesthesia Pre-Procedure Evaluation (Addendum)
Anesthetic History   No history of anesthetic complications            Review of Systems / Medical History  Patient summary reviewed, nursing notes reviewed and pertinent labs reviewed    Pulmonary        Sleep apnea           Neuro/Psych       CVA       Cardiovascular    Hypertension              Exercise tolerance: >4 METS  Comments: On coumadin for DVT   GI/Hepatic/Renal  Within defined limits              Endo/Other      Hypothyroidism  Morbid obesity     Other Findings            Physical Exam    Airway          Intubated   Cardiovascular  Regular rate and rhythm,  S1 and S2 normal,  no murmur, click, rub, or gallop  Rhythm: regular  Rate: normal         Dental         Pulmonary  Breath sounds clear to auscultation               Abdominal  GI exam deferred       Other Findings            Anesthetic Plan    ASA: 4, emergent  Anesthesia type: general    Monitoring Plan: Arterial line    Post procedure ventilation   Induction: Intravenous      No separate anesthesia consent obtained.  Emergency procedure.

## 2014-11-28 NOTE — ED Notes (Signed)
Pt to CT w/ RN

## 2014-11-28 NOTE — Progress Notes (Signed)
Speech Therapy Note:  SLP orders received and chart reviewed. Discussed with RN, Connye BurkittAlly. Patient currently on vent and has just returned from procedure. Patient is inappropriate for swallowing evaluation at this time. SLP will complete as appropriate following day.   Thank you,  Dana Allanebecca Zens M.S. CFY-SLP  Ph: [phone]

## 2014-11-28 NOTE — Progress Notes (Addendum)
2015  assumed care of [facility] from [facility] Calmer, RN. patient moving all four extremities, bilateral wrist restraints in place.  ICP 32. EVD zeroed. DHT placement verified via x-ray    8:57 PM  patient vomited a large amount of emesis, chunky undigested food. oral suction and mouth care performed.    2100  ICP now 20. paged Dr. Excell SeltzerBaker to notify of emesis. no new orders at this time    2145  called pharmacy to request phenylephrine    2200  phenylephrine now at max dose of 200 mcg    2300  paged Dr. Excell SeltzerBaker. patient is requiring increased stimulation to respond.    0000  patient increasingly restless, increased precedex will continue to monitor.  continuing to titrate phenylephrine as ordered. 650 mg tylenol given for temp 38.5.    0100  EKG Results     Procedure 720 Value Units Date/Time    EKG, 12 LEAD, SUBSEQUENT [[id number]][[id number]] Collected:  11/28/14 1701    Order Status:  Completed Updated:  11/28/14 1703     Ventricular Rate 103 BPM      Atrial Rate 103 BPM      P-R Interval 136 ms      QRS Duration 84 ms      Q-T Interval 416 ms      QTC Calculation (Bezet) 544 ms      Calculated P Axis 36 degrees      Calculated R Axis 37 degrees      Calculated T Axis 38 degrees      Diagnosis --      Result:        Sinus tachycardia  Otherwise normal ECG  When compared with ECG of 28-Nov-2014 01:09,  QRS duration has decreased      EKG, 12 LEAD, SUBSEQUENT [[id number]][[id number]]     Order Status:  Sent     EKG, 12 LEAD, SUBSEQUENT [[id number]][[id number]]     Order Status:  Sent           0400  patient became very combative in CT. precedex increased which did not have any effect. patient was moving her arms and legs and trying to sit up.   returned to room at 0448    Bedside and Verbal shift change report given to UkraineKara, Charity fundraiserN (Cabin crewoncoming nurse) by Leanne LovelyJudonne D Baxter, RN   (offgoing nurse). Report included the following information SBAR, Intake/Output, MAR and Recent Results.

## 2014-11-28 NOTE — Progress Notes (Signed)
Pt to NSICU at 1130, on monitor with RNs.  Pt as assessed unresponsive no corneals or gag, pupils equal and non-reactive.  At 1145 pupils were reactive sluggishly and by 1330  they were briskly reactive and equal.  Pt began moving right arm nonpurposly at 1215 and moving both arms weakly left < right by 1400.  Pt is now opening eyes to name not following commands at 1830.    Dr. Excell SeltzerBaker in to see patient shortly after arrival, I spoke with him by phone twice due to concerns of low b/p. Pt was on cardene for sbp > 150 from 1220 to `1300 at max of 10 then bp started to fall and cardene off. Dr. Excell SeltzerBaker aware of this.  I called him a second time due to continuing falling bp and was given order for neo which I did start at 1830 to keep sbp > 90.  Pt at 40 mcgs as ordered.  uop is excellent and all labs sent,     Reporting off to oncoming shift.

## 2014-11-28 NOTE — ED Provider Notes (Signed)
HPI Comments: 2:26 AM  Renee West is a 59 y.o. female who presents to the ED with complaints of unresponsiveness. As per EMS, Pt was found unresponsive in home. En route to [facility] pt noted to be biting tongue. At [facility], pt was intubated, ct head showing acute SAH. Pt on coumadin.   Pt given keppra  IV.     Pt transferred for NSGY management. Case discussed with Dr. Leanne Chang.       The history is provided by the EMS personnel. The history is limited by the condition of the patient.        Past Medical History:   Diagnosis Date   ??? Hypothyroid    ??? DVT (deep venous thrombosis) (HCC)    ??? Warfarin anticoagulation        Past Surgical History:   Procedure Laterality Date   ??? Hx hysterectomy     ??? Hx thyroidectomy           History reviewed. No pertinent family history.    History     Social History   ??? Marital Status: WIDOWED     Spouse Name: N/A   ??? Number of Children: N/A   ??? Years of Education: N/A     Occupational History   ??? Not on file.     Social History Main Topics   ??? Smoking status: Never Smoker    ??? Smokeless tobacco: Not on file   ??? Alcohol Use: No   ??? Drug Use: No   ??? Sexual Activity: Not on file     Other Topics Concern   ??? Not on file     Social History Narrative   ??? No narrative on file           ALLERGIES: Review of patient's allergies indicates no known allergies.      Review of Systems   Unable to perform ROS: Patient unresponsive       Filed Vitals:    11/28/14 0242 11/28/14 0325 11/28/14 0355 11/28/14 0430   BP:  130/87 150/89 139/89   Pulse:  57  65   Resp: 16   30   SpO2: 100% 100%  92%            Physical Exam   Constitutional: She appears well-developed and well-nourished.   HENT:   Head: Normocephalic and atraumatic.   Eyes:   Pupils bilateral 5:3   Neck: Neck supple. No JVD present.   Cardiovascular: Normal rate.    DP 2+ bilaterally.    Pulmonary/Chest: Effort normal. No respiratory distress.   Equal bilateral breath sounds.     Abdominal: Soft. She exhibits no distension. There is no tenderness. There is no rebound.   Musculoskeletal: She exhibits no edema.   Neurological: She is unresponsive.   Lower extremity withdraw to pain bilaterally.    Skin: Skin is warm and dry. No erythema.        MDM  Number of Diagnoses or Management Options  SAH (subarachnoid hemorrhage) [facility]):   Diagnosis management comments: Pt arrives on versed gtt at /hr    INR 2.3, given 5000u of kcentra.     Pt did not receive vit k at [facility] so will give here.   Had  of keppra, will give  here.     Reviewed labs from OSH, no indication for repeat  Check type and screen.     Started on nicardipene for htn, after bolus of hydralazine.  Ct head and cta head per Dr. Excell Seltzer.       Procedures      Medications ordered:   Medications   levETIRAcetam (KEPPRA) 500 mg/5 mL injection (1,000 mg  Canceled Entry 11/28/14 0356)   phytonadione (vitamin K1) (AQUA-MEPHYTON) 10 mg/mL injection (  Canceled Entry 11/28/14 0340)   heparinized saline 3 units/mL infusion 3,000 Units (not administered)   heparinized saline 3 units/mL infusion 3,000 Units (not administered)   heparinized saline 3 units/mL infusion (not administered)   heparinized saline 3 units/mL infusion (not administered)   iodixanol (VISIPAQUE) 270 mg iodine/mL contrast injection 50-100 mL (not administered)   iodixanol (VISIPAQUE) 270 mg iodine/mL contrast injection 100-150 mL (not administered)   heparinized saline 3 units/mL infusion (not administered)   heparinized saline 3 units/mL infusion (not administered)   iopamidol (ISOVUE 250) 51 % contrast injection 1-50 mL (not administered)   lidocaine (XYLOCAINE) 20 mg/mL (2 %) injection 20-1,000 mg (not administered)   verapamil (ISOPTIN) 2.5 mg/mL injection 2.5-5 mg (not administered)   niCARdipine (CARDENE) 25 mg in 0.9% sodium chloride 250 mL infusion (10 mg/hr IntraVENous New Bag 11/28/14 0420)    hum prothrombin cplx(PCC)29fact (KCENTRA) injection 5,000 Int'l Units (0 Int'l Units IntraVENous IV Completed 11/28/14 0340)   midazolam (VERSED) injection 2 mg (2 mg IntraVENous Given 11/28/14 0245)   phytonadione (vitamin K1) (AQUA-MEPHYTON) 10 mg in 0.9% sodium chloride 50 mL IVPB (10 mg IntraVENous New Bag 11/28/14 0340)   levETIRAcetam (KEPPRA) 1,000 mg in 100 ml IVPB (1,000 mg IntraVENous New Bag 11/28/14 0357)   vecuronium (NORCURON) injection 10 mg (0 mg IntraVENous IV Completed 11/28/14 0304)   iopamidol (ISOVUE 300) 61 % contrast injection 100 mL (75 mL IntraVENous Given 11/28/14 0348)   hydrALAZINE (APRESOLINE) 20 mg/mL injection 20 mg (20 mg IntraVENous Given 11/28/14 0412)         Lab findings:  Labs Reviewed   POC G3 - Abnormal; Notable for the following:     pH (POC) 7.341 (*)     pCO2 (POC) 45.7 (*)     pO2 (POC) 139 (*)     sO2 (POC) 99 (*)     All other components within normal limits   CULTURE, URINE   BLOOD GAS, ARTERIAL   ASPIRIN TEST   PROTHROMBIN TIME + INR   DRUG SCREEN, URINE   CARDIAC PANEL,(CK, CKMB & TROPONIN)   METABOLIC PANEL, COMPREHENSIVE   URINALYSIS W/ RFLX MICROSCOPIC   TYPE & SCREEN       EKG interpretation:    X-Ray, CT or other radiology findings or impressions:  CT HEAD WO CONT    (Results Pending)   CTA HEAD    (Results Pending)   IR UNLISTED PROCEDURE    (Results Pending)   XR CHEST PORT    (Results Pending)     cxr   ETT in appropriate position. NGT in esophagus.     Progress notes, Consult notes or additional Procedure notes:   Dr. Excell Seltzer at bedside, updated family on condition, plan for intervention as pt with [facility], active bleeding and aneurysm.     Reevaluation of patient:   I have reevaluated patient. Patient condition unchanged.     Dispo:  Patient was admitted.     Scribe Attestation:   I, F. Fermin Schwab, am scribing for and in the presence of Laurin Coder, DO on this day 11/28/2014 at 2:29 AM   Fermin Schwab, scribe    Provider Attestation:   I personally  performed the services described in the documentation, reviewed the documentation, as recorded by the scribe in my presence, and it accurately and completely records my words and actions.  Laurin CoderAlina M Grier Czerwinski, DO. 2:29 AM

## 2014-11-28 NOTE — Progress Notes (Signed)
Order received and chart reviewed patient just admitted and has both legs immobilized at this time will see tomorrow.

## 2014-11-28 NOTE — ED Notes (Addendum)
Patient arrived via EMS due to patient transitioning from unresponsive to combative, biting her tongue causing bleeding in her mouth, Coughing blood.  Patient arrived with a 18ga IV to Right AC.  Eyes open, patient not following commands.    00:20 - Patient arrived into [facility] ED Bed 6, Dr Batts, RN Lurlean Leyden, RN Anola Gurney and [facility].  Patient Very combative with significant bleeding in mouth, coughing blood.  Unable to perform NIH due to immediate Sedation/intubation to protect airway.  Placed on Monitor, pulse oximeter.  Suctioned oral secretions/blood.    00:20 - Patient given  Succinylcholine IV Push and  Etomidate IV Push as ordered per Dr. Alva Garnet, adequate sedation achieved.    00:21 - Patient intubated by Dr. Danne Harbor with 7.5 ETT, 23 cm at the lip, verified with ETCO2 detector and later with CXR, Secured with Holister ETT holder.    00:27 - Started patient on Propofol 14mcg/kg(125kg)/min per Dr. Danne Harbor  via Guardrails pump    00:30 - while prepping patient for transport to CT, patient's heartrate decreased to 30BPM, Propofol decreased to 41mcg/kg(125kg)/min, noting heart rate increase to WNL.    00:33 - heartrate increased to 69 BPM.  Patient Transported to CT with 2 RN's, Cardiac Monitor, Patient intubated being Ventilated with Ambubag at 16/min.    00:47 - Returned patient to [facility] ER Bed 6.    01:09 -  Hydralazine administered IVpush by Precious Gilding RN, for elevated BP.    01:20- OG Tube placed 36fr with no external markings. Placement confirmed with air bolus and secured to ETT by Anola Gurney RN.    01:25 - Patient Cleaned by Anola Gurney RN due to Bowel Movement.  Soft Brown Stool.      TRANSFER - OUT REPORT:    Verbal report given to Scherrie Merritts RN (name) on Renee West  being transferred to [facility] Emergency Department (unit) for urgent transfer       Report consisted of patient???s Situation, Background, Assessment and   Recommendations(SBAR).      Information from the following report(s) SBAR, MAR and Recent Results was reviewed with the receiving nurse.    Lines:   Peripheral IV 11/28/14 Left Forearm (Active)    18ga IV Right AC  18ga IV Left AC    Opportunity for questions and clarification was provided.      Patient transported with:  Portable Ventilator as patient is intubated with 7.5 ETT/23cm at Lip.      01:30 - 16Fr Foley Placed per Dr. Danne Harbor by Anola Gurney RN.  Midazolam  IV Push administered by Dorthula Rue RN per Dr. Alva Garnet.  01:34 - Patient placed on Midazolam /43ml infusion at /hr per Dr. Alva Garnet using guardrail pump.    01:35 -  of Keppra administered IVPB Via pump by Anola Gurney RN, unable to scan due to pharmacist being in chart.    01:45 - 16 Fr Oral Gastric tube advanced 8cm and secured per Dr Alva Garnet.  Report given to [facility]    02:00 - PatAlva Garnetnt Departs for [facility] Via ALS Transport with 7.5 ETT 23Cm at lip, 16 Fr Foley, 16 Fr OG Tube - Secured, 18ga IV to Left AC, 18ga IV to Right AC and a Midazolam Drip at /hr.    02:12 - Provided [facility] with updated on patient    02:20 - updated [facility] ED Charge Nurse Scherrie Merritts RN  with Critical Results of Potassium 2.9 and that transport was not delayed to give Vitamin K and K-Centra.

## 2014-11-28 NOTE — Anesthesia Procedure Notes (Signed)
Arterial Line Placement    Start time: 11/28/2014 5:00 AM  End time: 11/28/2014 5:20 AM    Staffing  Performed by: anesthesiologist   Pre-procedure Checklist  7-step sterility protocol achieved  Timeout performed  Arterial Line Insertion  Attempts: 3 or more  Size: 20G  Location: right radialNo  Procedure Note  Arterial Line insertion    Operator:Talmadge Ganas M Caylan Schifano, MD    A time was out performed. The correct patient side, site, and procedure were verified. The left wrist was prepped with chlorhexidine.  The radial artery was punctured at 3 locations under ultrasound guidance and a catheter was unable to be threaded. After discussion of necessity with the surgeon, the right wrist was then prepped and a 20g catheter was placed over a wire into the radial artery on the first attempt.  A dressing was applied and the catheter was transduced.  There were no complications.      Ishmael Berkovich M Shae Augello, MD

## 2014-11-28 NOTE — ED Notes (Signed)
Patient was found unresponsive by family.  [facility]  Patient was intubated and found to have a head bleed.

## 2014-11-28 NOTE — Progress Notes (Signed)
Chaplain conducted an initial consultation for Renee West, who is a 59 y.o.,female. However, patient was on ventilator support, with no family at bedside.  Patient???s primary language is: [place].   According to the patient???s EMR, her religious affiliation is: [employer].     The chaplain provided the following interventions:  Initiated a relationship of care and support, leaving patient's family a card with a personal note.   Provided information about Spiritual Care Services.  Offered silent prayer and, in the note, assurance of continued prayers on patient's behalf.   Chart reviewed.    Plan:  Chaplains will continue our attempts to connect with patient's family and then provide pastoral care on an as-needed/requested basis.    Chaplain Sadie Haberobyn Ruth  Board Certified Chaplain   [facility]   [phone]

## 2014-11-28 NOTE — Brief Op Note (Signed)
NEUROVASCULAR BRIEF OPERATIVE NOTE    Renee West    Date of Procedure:  11/28/2014    Surgeon:  Andrey CotaJOHN R [name], MD    Preoperative Diagnosis: SAH, hydrocephalus    Postoperative Diagnosis: same     Procedure: cerebral angiography, right frontal ventriculostomy, LCFV CVL     Anesthesia: geta    Specimens: none    Implants: none    Estimated Blood Loss: minimal    Findings:    LVA dissecting 3mm fusiform pseudoaneurysm.    LM3 dissecting 2mm pseudoaneurysm.    Blood tinged CSF with opening pressure > 32cm. ICP down to 20-25cm after draining small amount of CSF. Right frontal EVD catheter cross midline to frontal horn of right lateral ventricle.    Complications: none immediate

## 2014-11-28 NOTE — Progress Notes (Signed)
Received patient from [facility], intubated with 7.5 ETT, secure at 24 at the lip - BBS equal, End tidal CO2 reading 38 - ABG obtqained, results shown to ER physician - patient going to CT now

## 2014-11-28 NOTE — Consults (Signed)
NEUROSURGERY CONSULT NOTE    Patient: Renee West MRN: [id number]  SSN: [id number]    Date of Birth: 1956/01/25  Age: 59 y.o.  Sex: female      CONSULT DATE: 11/28/2014    REQUESTING PHYSICIAN: No referring provider defined for this encounter. .ref    CONSULTING PHYSICAIN: Ramiro Harvest, MD    REASON FOR CONSULT: SAH    HPI: This patient is a 59 y.o.  handed  African American female who presents visiting from [place] and in the middle of conversation became unresponsive at hotel. Family called 80 and she was taken to [facility]. I was called by Dr. Littie Deeds. Became combative and was intubated. CT showed diffuse SAH predominantly in posterior fossa on L.  bp controlled, keppra given, on coumadin INR 2.4, reversal agents not given until arrival here.       At this time Dr. 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 Anticoagulant causing adverse effect in therapeutic use T45.515A   ??? Malignant hypertension requiring acute intensive management I10   ??? Respiratory center failure requring intubation G93.89   ??? SAH (subarachnoid hemorrhage) (HCC) I60.9   ??? Cerebral edema (HCC) G93.6   ??? Pulmonary edema J81.1   ??? Hypoxemia R09.02   ??? Hydrocephalus G91.9   ??? 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 EVD right frontal Z98.89       No Known Allergies    Past Medical History   Diagnosis Date   ??? Hypothyroid    ??? DVT (deep venous thrombosis) ([place])    ??? Warfarin anticoagulation    ??? 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 Hydrocephalus 11/28/2014   ??? EVD right frontal 11/28/2014       Past Surgical History   Procedure Laterality Date   ??? Hx hysterectomy     ??? Hx thyroidectomy         History     Social History   ??? Marital Status: WIDOWED     Spouse Name: N/A   ??? Number of Children: N/A   ??? Years of Education: N/A     Occupational History   ??? Not on file.      Social History Main Topics   ??? Smoking status: Never Smoker    ??? Smokeless tobacco: Not on file   ??? Alcohol Use: No   ??? Drug Use: No   ??? Sexual Activity: Not on file     Other Topics Concern   ??? Not on file     Social History Narrative   ??? No narrative on file       History reviewed. No pertinent family history.    Current Facility-Administered Medications   Medication Dose Route Frequency   ??? bacitracin 500 unit/gram packet       ??? ELECTROLYTE REPLACEMENT PROTOCOL  1 Each Other Q8H   ??? ELECTROLYTE REPLACEMENT PROTOCOL  1 Each Other Q8H   ??? ELECTROLYTE REPLACEMENT PROTOCOL  1 Each Other Q8H   ??? PHARMACY INFORMATION NOTE  1 Each Other DAILY   ??? dextrose 5% and 0.9% NaCl infusion  0.64 mL/kg/hr IntraVENous CONTINUOUS   ??? chlorhexidine (PERIDEX) 0.12 % mouthwash 15 mL  15 mL Oral Q12H   ??? levETIRAcetam (KEPPRA) 750 mg in 0.9% sodium chloride IVPB  750 mg IntraVENous Q12H   ??? ondansetron (ZOFRAN)  injection 1 mg  1 mg IntraVENous Q6H PRN   ??? labetalol (NORMODYNE;TRANDATE) 20 mg/4 mL (5 mg/mL) injection 5 mg  5 mg IntraVENous Q15MIN PRN   ??? niMODipine (NIMOTOP) capsule 60 mg  60 mg Oral Q4H   ??? acetaminophen (TYLENOL) tablet 650 mg  650 mg Oral Q4H PRN   ??? acetaminophen (TYLENOL) suppository 650 mg  650 mg Rectal Q4H PRN   ??? senna-docusate (PERICOLACE) 8.6-50 mg per tablet 2 Tab  2 Tab Oral QHS   ??? bisacodyl (DULCOLAX) tablet 5 mg  5 mg Oral DAILY PRN   ??? bisacodyl (DULCOLAX) suppository 10 mg  10 mg Rectal DAILY PRN   ??? magnesium sulfate 2 g/50 ml IVPB (premix or compounded)  2 g IntraVENous Q12H   ??? pantoprazole (PROTONIX) 40 mg in sodium chloride 0.9 % 10 mL injection  40 mg IntraVENous Q12H   ??? albuterol (PROVENTIL VENTOLIN) nebulizer solution 2.5 mg  2.5 mg Nebulization V0J PRN   ??? folic acid (FOLVITE) 1 mg, thiamine (B-1) 100 mg in 0.9% sodium chloride 50 mL ivpb   IntraVENous DAILY   ??? 0.9% sodium chloride infusion  5 mL/hr IntraVENous CONTINUOUS   ??? dexmedetomidine (PRECEDEX) 400 mcg in 0.9% sodium chloride 100 mL  infusion  0-0.7 mcg/kg/hr IntraVENous TITRATE   ??? niCARdipine (CARDENE) 125 mg in 0.9% sodium chloride 250 mL infusion   IntraVENous CONTINUOUS       EXAM:      Visit Vitals   Item Reading   ??? BP 171/93 mmHg   ??? Pulse 78   ??? Resp 12   ??? SpO2 99%         Labs Reviewed:  Recent Results (from the past 24 hour(s))   CBC WITH AUTOMATED DIFF    Collection Time: 11/28/14 12:30 AM   Result Value Ref Range    WBC 11.5 4.6 - 13.2 K/uL    RBC 4.12 (L) 4.20 - 5.30 M/uL    HGB 11.5 (L) 12.0 - 16.0 g/dL    HCT 35.4 35.0 - 45.0 %    MCV 85.9 74.0 - 97.0 FL    MCH 27.9 24.0 - 34.0 PG    MCHC 32.5 31.0 - 37.0 g/dL    RDW 14.2 11.6 - 14.5 %    PLATELET 222 135 - 420 K/uL    MPV 10.4 9.2 - 11.8 FL    NEUTROPHILS 63 40 - 73 %    LYMPHOCYTES 26 21 - 52 %    MONOCYTES 10 3 - 10 %    EOSINOPHILS 1 0 - 5 %    BASOPHILS 0 0 - 2 %    ABS. NEUTROPHILS 7.3 1.8 - 8.0 K/UL    ABS. LYMPHOCYTES 3.0 0.9 - 3.6 K/UL    ABS. MONOCYTES 1.1 0.05 - 1.2 K/UL    ABS. EOSINOPHILS 0.1 0.0 - 0.4 K/UL    ABS. BASOPHILS 0.0 0.0 - 0.06 K/UL    DF AUTOMATED     PROTHROMBIN TIME + INR    Collection Time: 11/28/14 12:30 AM   Result Value Ref Range    Prothrombin time 25.6 (H) 11.5 - 15.2 sec    INR 2.3 (H) 0.8 - 1.2     PTT    Collection Time: 11/28/14 12:30 AM   Result Value Ref Range    aPTT 39.9 (H) 24.6 - 50.0 SEC   METABOLIC PANEL, BASIC    Collection Time: 11/28/14 12:30 AM   Result Value Ref Range  Sodium 143 136 - 145 mmol/L    Potassium 2.9 (LL) 3.5 - 5.5 mmol/L    Chloride 106 100 - 108 mmol/L    CO2 27 21 - 32 mmol/L    Anion gap 10 3.0 - 18 mmol/L    Glucose 184 (H) 74 - 99 mg/dL    BUN 21 (H) 7.0 - 18 MG/DL    Creatinine 1.18 0.6 - 1.3 MG/DL    BUN/Creatinine ratio 18 12 - 20      GFR est AA 56 (L) >60 ml/min/1.64m    GFR est non-AA 47 (L) >60 ml/min/1.771m   Calcium 7.8 (L) 8.5 - 10.1 MG/DL   CARDIAC PANEL,(CK, CKMB & TROPONIN)    Collection Time: 11/28/14 12:30 AM   Result Value Ref Range    CK 191 26 - 192 U/L    CK - MB 1.6 0.5 - 3.6 ng/ml     CK-MB Index 0.8 0.0 - 4.0 %    Troponin-I, Qt. <0.02 0.00 - 0.06 NG/ML   EKG, 12 LEAD, INITIAL    Collection Time: 11/28/14  1:09 AM   Result Value Ref Range    Ventricular Rate 77 BPM    Atrial Rate 77 BPM    P-R Interval 208 ms    QRS Duration 110 ms    Q-T Interval 450 ms    QTC Calculation (Bezet) 509 ms    Calculated P Axis 53 degrees    Calculated R Axis 62 degrees    Calculated T Axis 68 degrees    Diagnosis       Normal sinus rhythm  Prolonged QT  Abnormal ECG  No previous ECGs available     POC G3    Collection Time: 11/28/14  2:34 AM   Result Value Ref Range    Device: VENT      FIO2 (POC) 100 %    pH (POC) 7.341 (L) 7.35 - 7.45      pCO2 (POC) 45.7 (H) 35.0 - 45.0 MMHG    pO2 (POC) 139 (H) 80 - 100 MMHG    HCO3 (POC) 24.7 22 - 26 MMOL/L    sO2 (POC) 99 (H) 92 - 97 %    Base deficit (POC) 1 mmol/L    Mode ASSIST CONTROL      Tidal volume 500 ml    Set Rate 12 bpm    PEEP/CPAP (POC) 5 cmH2O    PIP (POC) 21      Allens test (POC) YES      Inspiratory Time 1 sec    Total resp. rate 16      Site RIGHT RADIAL      Specimen type (POC) ARTERIAL      Performed by MaGilford Raid   Volume control plus YES     TYPE & SCREEN    Collection Time: 11/28/14  4:15 AM   Result Value Ref Range    Crossmatch Expiration 12/01/2014     ABO/Rh(D) AB POSITIVE     Antibody screen NEG    DRUG SCREEN, URINE    Collection Time: 11/28/14  5:20 AM   Result Value Ref Range    BENZODIAZEPINE POSITIVE (A) NEG      BARBITURATES NEGATIVE  NEG      THC (TH-CANNABINOL) NEGATIVE  NEG      OPIATES NEGATIVE  NEG      PCP(PHENCYCLIDINE) NEGATIVE  NEG      COCAINE NEGATIVE  NEG  AMPHETAMINE NEGATIVE  NEG      METHADONE NEGATIVE       HDSCOM (NOTE)    URINALYSIS W/ RFLX MICROSCOPIC    Collection Time: 11/28/14  5:20 AM   Result Value Ref Range    Color YELLOW      Appearance CLEAR      Specific gravity 1.030 1.005 - 1.030      pH (UA) 5.0 5.0 - 8.0      Protein NEGATIVE  NEG mg/dL    Glucose 250 (A) NEG mg/dL    Ketone NEGATIVE  NEG mg/dL     Bilirubin NEGATIVE  NEG      Blood TRACE (A) NEG      Urobilinogen 0.2 0.2 - 1.0 EU/dL    Nitrites NEGATIVE  NEG      Leukocyte Esterase MODERATE (A) NEG     URINE MICROSCOPIC ONLY    Collection Time: 11/28/14  5:20 AM   Result Value Ref Range    WBC 21 to 35 0 - 4 /hpf    RBC 0 to 3 0 - 5 /hpf    Epithelial cells 2+ 0 - 5 /lpf    Bacteria 4+ (A) NEG /hpf   ASPIRIN TEST    Collection Time: 11/28/14  5:30 AM   Result Value Ref Range    Aspirin test 658 ARU   PROTHROMBIN TIME + INR    Collection Time: 11/28/14  5:30 AM   Result Value Ref Range    Prothrombin time 13.4 11.5 - 15.2 sec    INR 1.0 0.8 - 1.2     CARDIAC PANEL,(CK, CKMB & TROPONIN)    Collection Time: 11/28/14  5:30 AM   Result Value Ref Range    CK 244 (H) 26 - 192 U/L    CK - MB 5.4 (H) 0.5 - 3.6 ng/ml    CK-MB Index 2.2 0.0 - 4.0 %    Troponin-I, Qt. 0.10 (H) 0.0 - 3.086 NG/ML   METABOLIC PANEL, COMPREHENSIVE    Collection Time: 11/28/14  5:30 AM   Result Value Ref Range    Sodium 140 136 - 145 mmol/L    Potassium 2.6 (LL) 3.5 - 5.5 mmol/L    Chloride 108 100 - 108 mmol/L    CO2 23 21 - 32 mmol/L    Anion gap 9 3.0 - 18 mmol/L    Glucose 207 (H) 74 - 99 mg/dL    BUN 15 7.0 - 18 MG/DL    Creatinine 0.89 0.6 - 1.3 MG/DL    BUN/Creatinine ratio 17 12 - 20      GFR est AA >60 >60 ml/min/1.58m    GFR est non-AA >60 >60 ml/min/1.769m   Calcium 7.2 (L) 8.5 - 10.1 MG/DL    Bilirubin, total 0.2 0.2 - 1.0 MG/DL    ALT 26 13 - 56 U/L    AST 31 15 - 37 U/L    Alk. phosphatase 120 (H) 45 - 117 U/L    Protein, total 6.9 6.4 - 8.2 g/dL    Albumin 2.9 (L) 3.4 - 5.0 g/dL    Globulin 4.0 2.0 - 4.0 g/dL    A-G Ratio 0.7 (L) 0.8 - 1.7     CBC WITH AUTOMATED DIFF    Collection Time: 11/28/14  5:30 AM   Result Value Ref Range    WBC 13.9 (H) 4.6 - 13.2 K/uL    RBC 3.94 (L) 4.20 - 5.30 M/uL    HGB 11.0 (  L) 12.0 - 16.0 g/dL    HCT 34.3 (L) 35.0 - 45.0 %    MCV 87.1 74.0 - 97.0 FL    MCH 27.9 24.0 - 34.0 PG    MCHC 32.1 31.0 - 37.0 g/dL    RDW 14.6 (H) 11.6 - 14.5 %     PLATELET 193 135 - 420 K/uL    MPV 9.8 9.2 - 11.8 FL    NEUTROPHILS 86 (H) 40 - 73 %    LYMPHOCYTES 10 (L) 21 - 52 %    MONOCYTES 4 3 - 10 %    EOSINOPHILS 0 0 - 5 %    BASOPHILS 0 0 - 2 %    ABS. NEUTROPHILS 11.8 (H) 1.8 - 8.0 K/UL    ABS. LYMPHOCYTES 1.4 0.9 - 3.6 K/UL    ABS. MONOCYTES 0.6 0.05 - 1.2 K/UL    ABS. EOSINOPHILS 0.0 0.0 - 0.4 K/UL    ABS. BASOPHILS 0.0 0.0 - 0.06 K/UL    DF AUTOMATED     PRO-BNP    Collection Time: 11/28/14  5:30 AM   Result Value Ref Range    NT pro-BNP 369 0 - 900 PG/ML   POC ACTIVATED CLOTTING TIME    Collection Time: 11/28/14  5:38 AM   Result Value Ref Range    Activated Clotting Time (POC) 116 79 - 138 SECS   POC G3    Collection Time: 11/28/14  5:44 AM   Result Value Ref Range    Flow rate (POC) PENDING L/M    pH (POC) 7.234 (LL) 7.35 - 7.45      pCO2 (POC) 50.2 (H) 35.0 - 45.0 MMHG    pO2 (POC) 63 (L) 80 - 100 MMHG    HCO3 (POC) 21.2 (L) 22 - 26 MMOL/L    sO2 (POC) 87 (L) 92 - 97 %    Base deficit (POC) 6 mmol/L    Specimen type (POC) ARTERIAL      Performed by Sharmon Leyden    POC G3    Collection Time: 11/28/14  9:14 AM   Result Value Ref Range    Flow rate (POC) PENDING L/M    pH (POC) 7.235 (LL) 7.35 - 7.45      pCO2 (POC) 46.7 (H) 35.0 - 45.0 MMHG    pO2 (POC) 116 (H) 80 - 100 MMHG    HCO3 (POC) 19.8 (L) 22 - 26 MMOL/L    sO2 (POC) 98 (H) 92 - 97 %    Base deficit (POC) 8 mmol/L    Specimen type (POC) ARTERIAL      Performed by Arcelia Jew Amy    PROTHROMBIN TIME + INR    Collection Time: 11/28/14 10:12 AM   Result Value Ref Range    Prothrombin time 14.6 11.5 - 15.2 sec    INR 1.1 0.8 - 1.2         Mental Status and language function:  Intubated, sedated post procedure      LABS:    CBC   Lab Results   Component Value Date/Time    WBC 13.9* 11/28/2014 05:30 AM    RBC 3.94* 11/28/2014 05:30 AM    HCT 34.3* 11/28/2014 05:30 AM    MCV 87.1 11/28/2014 05:30 AM    MCH 27.9 11/28/2014 05:30 AM    MCHC 32.1 11/28/2014 05:30 AM    RDW 14.6* 11/28/2014 05:30 AM       Basic Metabolic Profile   Lab Results   Component  Value Date    NA 140 11/28/2014    CO2 23 11/28/2014    BUN 15 11/28/2014         Coagulation   Lab Results   Component Value Date    INR 1.1 11/28/2014    APTT 39.9* 11/28/2014           STUDIES:      IMPRESSION:   59 y.o.  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

## 2014-11-28 NOTE — ED Provider Notes (Signed)
HPI Comments: 12:14 AM Renee West is a 59 y.o. female with a history of DVT in the leg, Thyroidectomy, and Hysterectomy who presents to ED via EMS very combative with altered mental status.    Per EMS from the pt's family, the pt talking with family members while laying down the bed when she suddenly stopped talking and became unresponsive which prompted the family to contact EMS, to have the pt brought into the ED for further evaluation.     No reports of seizure activity     The pt currently takes Coumadin, Paxil and HTN medication.     PCP: No primary care provider on file.    PMHx and medications obtained after initial assessment and resuscitation from family members.    On arrival, patient in immediate need for advanced airway intervention.  RSI initiated.    The history is provided by the EMS personnel and a relative.        No past medical history on file.    No past surgical history on file.      No family history on file.    History     Social History   ??? Marital Status: WIDOWED     Spouse Name: N/A   ??? Number of Children: N/A   ??? Years of Education: N/A     Occupational History   ??? Not on file.     Social History Main Topics   ??? Smoking status: Not on file   ??? Smokeless tobacco: Not on file   ??? Alcohol Use: Not on file   ??? Drug Use: Not on file   ??? Sexual Activity: Not on file     Other Topics Concern   ??? Not on file     Social History Narrative   ??? No narrative on file           ALLERGIES: Review of patient's allergies indicates not on file.      Review of Systems   Unable to perform ROS: Mental status change       Filed Vitals:    11/28/14 0104 11/28/14 0109 11/28/14 0115 11/28/14 0130   BP: 152/94 152/97 113/98 123/78   Pulse: 67 78 80 63   Resp: 24  16 14    Weight:       SpO2: 98%  99% 100%            Physical Exam   Constitutional: She appears well-developed and well-nourished. No distress.   opverweight   HENT:   Head: Normocephalic.   Mouth/Throat: No oropharyngeal exudate.    Blood from mouth, bit tongue.       Eyes:   Pupils sluggish  exophthalmus   Neck: Normal range of motion. No JVD present. No tracheal deviation present.   Cardiovascular: Normal rate and regular rhythm.  Exam reveals no gallop and no friction rub.    No murmur heard.  Pulmonary/Chest: Effort normal and breath sounds normal. No respiratory distress. She has no wheezes. She has no rales. She exhibits no tenderness.   Abdominal: Soft. Bowel sounds are normal. She exhibits no distension.   Musculoskeletal:   Moving all extremities, combative, flailing   Neurological:   Combative, flailing extremities, not oriented   Skin: Skin is warm. No erythema.   Nursing note and vitals reviewed.       MDM  Number of Diagnoses or Management Options     Amount and/or Complexity of Data Reviewed  Clinical lab tests:  ordered  Tests in the radiology section of CPT??: reviewed  Tests in the medicine section of CPT??: ordered  Obtain history from someone other than the patient: yes  Review and summarize past medical records: yes  Discuss the patient with other providers: yes  Independent visualization of images, tracings, or specimens: yes    Risk of Complications, Morbidity, and/or Mortality  Presenting problems: high  Diagnostic procedures: high  Management options: high    Critical Care  Total time providing critical care: 75-105 minutes    Patient Progress  Patient progress: (Critical, improved)      Intubation  Consent: The procedure was performed in an emergent situation.  Date/Time: 11/28/2014 1:32 AM  Performed by: attendingIndications: airway protection  Pre-procedure re-eval: Immediately prior to the procedure, the patient was reevaluated and found suitable for the planned procedure and any planned medications.  Time out: Immediately prior to the procedure a time out was called to verify the correct patient, procedure, equipment, staff and marking as appropriate..  Intubation method: fiberoptic oral and video-assisted (glidescope)   Patient status: paralyzed (RSI)  Preoxygenation: BVM  Sedatives: etomidate (AMIDATE)  Paralytic: succinylcholine  Laryngoscope size: glidescope 4.  Tube size: 7.5 mm  Tube type: cuffed  Number of attempts: 1  Cricoid pressure: no  Cords visualized: yes  Post-procedure assessment: chest rise and CO2 detector  Breath sounds: equal and absent over the epigastrium  Cuff inflated: yes  ETT to lip: 23 cm  Chest x-ray interpreted by me.  Chest x-ray findings: endotracheal tube in appropriate position  Patient tolerance: Patient tolerated the procedure well with no immediate complications  My total time at bedside, performing this procedure was 1-15 minutes.        Pulse Oximetry:   Intubated 100%    Medications ordered:       Medications   levETIRAcetam (KEPPRA) 500 mg in 100 ml IVPB (not administered)   phytonadione (vitamin K1) (AQUA-MEPHYTON) 10 mg in 0.9% sodium chloride 50 mL IVPB (not administered)   propofol (DIPRIVAN) infusion (0 mcg/kg/min ?? 125 kg IntraVENous IV Completed 11/28/14 0135)   midazolam (VERSED) 1 mg/mL injection (not administered)   hydrALAZINE (APRESOLINE) 20 mg/mL injection (not administered)   levETIRAcetam in NaCl (iso-os) (KEPPRA) 500 mg/100 mL IVPB premix pgbk (not administered)   phytonadione (vitamin K1) (AQUA-MEPHYTON) 10 mg/mL injection (not administered)   midazolam in normal saline (VERSED) 1 mg/mL infusion (2 mg/hr IntraVENous New Bag 11/28/14 0134)   hydrALAZINE (APRESOLINE) 20 mg/mL injection 10 mg (10 mg IntraVENous Given 11/28/14 0109)         Lab findings:    Recent Results (from the past 12 hour(s))   CBC WITH AUTOMATED DIFF    Collection Time: 11/28/14 12:30 AM   Result Value Ref Range    WBC 11.5 4.6 - 13.2 K/uL    RBC 4.12 (L) 4.20 - 5.30 M/uL    HGB 11.5 (L) 12.0 - 16.0 g/dL    HCT 16.135.4 09.635.0 - 04.545.0 %    MCV 85.9 74.0 - 97.0 FL    MCH 27.9 24.0 - 34.0 PG    MCHC 32.5 31.0 - 37.0 g/dL    RDW 40.914.2 81.111.6 - 91.414.5 %    PLATELET 222 135 - 420 K/uL    MPV 10.4 9.2 - 11.8 FL     NEUTROPHILS 63 40 - 73 %    LYMPHOCYTES 26 21 - 52 %    MONOCYTES 10 3 - 10 %    EOSINOPHILS 1  0 - 5 %    BASOPHILS 0 0 - 2 %    ABS. NEUTROPHILS 7.3 1.8 - 8.0 K/UL    ABS. LYMPHOCYTES 3.0 0.9 - 3.6 K/UL    ABS. MONOCYTES 1.1 0.05 - 1.2 K/UL    ABS. EOSINOPHILS 0.1 0.0 - 0.4 K/UL    ABS. BASOPHILS 0.0 0.0 - 0.06 K/UL    DF AUTOMATED     PROTHROMBIN TIME + INR    Collection Time: 11/28/14 12:30 AM   Result Value Ref Range    Prothrombin time 25.6 (H) 11.5 - 15.2 sec    INR 2.3 (H) 0.8 - 1.2     PTT    Collection Time: 11/28/14 12:30 AM   Result Value Ref Range    aPTT 39.9 (H) 24.6 - 37.7 SEC   METABOLIC PANEL, BASIC    Collection Time: 11/28/14 12:30 AM   Result Value Ref Range    Sodium 143 136 - 145 mmol/L    Potassium 2.9 (LL) 3.5 - 5.5 mmol/L    Chloride 106 100 - 108 mmol/L    CO2 27 21 - 32 mmol/L    Anion gap 10 3.0 - 18 mmol/L    Glucose 184 (H) 74 - 99 mg/dL    BUN 21 (H) 7.0 - 18 MG/DL    Creatinine 2.44 0.6 - 1.3 MG/DL    BUN/Creatinine ratio 18 12 - 20      GFR est AA 56 (L) >60 ml/min/1.82m2    GFR est non-AA 47 (L) >60 ml/min/1.68m2    Calcium 7.8 (L) 8.5 - 10.1 MG/DL   CARDIAC PANEL,(CK, CKMB & TROPONIN)    Collection Time: 11/28/14 12:30 AM   Result Value Ref Range    CK 191 26 - 192 U/L    CK - MB 1.6 0.5 - 3.6 ng/ml    CK-MB Index 0.8 0.0 - 4.0 %    Troponin-I, Qt. <0.02 0.00 - 0.06 NG/ML   EKG, 12 LEAD, INITIAL    Collection Time: 11/28/14  1:09 AM   Result Value Ref Range    Ventricular Rate 77 BPM    Atrial Rate 77 BPM    P-R Interval 208 ms    QRS Duration 110 ms    Q-T Interval 450 ms    QTC Calculation (Bezet) 509 ms    Calculated P Axis 53 degrees    Calculated R Axis 62 degrees    Calculated T Axis 68 degrees    Diagnosis       Normal sinus rhythm  Prolonged QT  Abnormal ECG  No previous ECGs available         EKG interpretation:    :  NSR@77 , prolonged QT, no stemi.    X-Ray, CT or other radiology findings or impressions:        CT HEAD WO CONT    Final Result   Per radiology, IMPRESSION: 1. Diffuse acute subarachnoid hemorrhage. 2. Diffuse effacement of sulci involving both cerebral hemispheres. Suggestive of developing diffuse brain edema.      XR CHEST SNGL V    (Results Pending)   ET tube in place.  OG tube short,  Advanced  8cm.    Consult notes or additional Procedure notes:   1:02 AM.Consult: Discussed care with Dr. Su Ley, Neurosurgery at [facility] Standard discussion; including history of patient???s chief complaint, available diagnostic results, and treatment course. Transfer to ER.  BP control with systolic target <120.      1:05 AM.  Consult:  Discussed care with Dr. Roetta Sessions, ER Physician at [facility] Standard discussion; including history of patient???s chief complaint, available diagnostic results, and treatment course. Will accept the pt.  1:20 AM. Consult: Discussed care with Dr. Su Ley, Neurosurgery at [facility]. He is aware the pt is currently on Coumadin.   1:22 AM. Consult: Discussed care with Dr. Noreene Filbert, ER Physician at [facility]. Will get 2 units of FFP ready for the pt.  1:27 AM. From chest XR, tube in place, OG tube is being advanced approximately 7cm from position as seen on the chest XR.    Hydralazine IV given.    Patient experienced episodes of bradycardia down to mid 30's on propofol, changed to Versed.  Because of bradycardia, labetalol not used.      KEPPRA IVPB given.    Vitamin K ordered but NOT GIVEN because all IV lines currently in use.    This center does NOT have Kcentra available.    Reevaluation of patient:     Diagnosis:   1. Subarachnoid hemorrhage (HCC)    2. Altered mental status, unspecified altered mental status type    3. Hypokalemia    4. Elevated international normalized ratio (INR)          Discharge/Disposition:  Patient was  Emergently transferred to [facility] ER to ER transfer, Dr. Roetta Sessions accepting.  Dr. Cyndie Chime, neurosurgeon involved in tranfer and aware of patient.        Scribe Attestation:    [employer] as a Neurosurgeon for and in the presence of Dr. Rona Ravens, MD Nov 28, 2014 at 12:14 AM       Provider Attestation:   I personally performed the services described in the documentation, reviewed the documentation, as recorded by the scribe in my presence, and it accurately and completely records my words and actions.     Reviewed and signed by:  Dr. Rona Ravens, MD

## 2014-11-28 NOTE — Other (Signed)
ICU Multidisciplinary Rounds  Events noted; needs restraint for safety. Ordered.   FiO2 100 PEEP 12 -> spo2 99%. CXR probably neurogenic pulm edema.         Should be able to wean some now. I will initiate some of this.  PCCM available as needed. Please call for questions or concerns.   -dar [phone]

## 2014-11-28 NOTE — Anesthesia Post-Procedure Evaluation (Signed)
Post-Anesthesia Evaluation & Assessment    Visit Vitals   Item Reading   ??? BP 93/65 mmHg   ??? Pulse 102   ??? Temp 35.2 ??C (95.4 ??F)   ??? Resp 13   ??? SpO2 100%       Nausea/Vomiting controlled    Post-operative hydration adequate.    Mental status & Level of consciousness: opens eyes to voice    Neurological status: moves left upper extremity nonpurposefully    Pulmonary status: on vent    Complications related to anesthesia: none    Patient has met all discharge requirements.    Additional comments:        Tunisia Landgrebe Dois Davenport, CRNA

## 2014-11-28 NOTE — H&P (Signed)
Neurovascular    Inpatient History & Physical        Patient: Renee West MRN: [id number]  SSN: [id number]    Date of Birth: 03/15/1956  Age: 59 y.o.  Sex: female        ID: 59 year-old right-handed female on warfarin anticoagulation for DVT presents with  CC: unresponsive then combative with respiratory distress requiring intubation found to have SAH.    HPI:  Dr. Su Ley called me about this patient in the [facility] ED.      The patient and her family were visiting this area from [place] for the holiday weekend.  They were staying together in a suite.  Her niece was talking with the patient through a divider between the sleeping areas.  The patient suddenly stopped participating in the conversation.  When the family went to see her she was sitting on the edge of the bed double forwarded at the waist..  She did not respond to verbal or tactile stimulation.  There was blood coming from her mouth.  They called 911.  The patient was brought to the [facility] ED.    The patient became more aroused and was combative but not following commands and had respiratory distress.  She was intubated.  CT showed diffuse SAH especially in the posterior fossa. The ventricles were small with sulcal crowding consistent with cerebral edema.  Blood pressure control was initiated.  Dr. Su Ley was contacted.  INR 2.3 on warfarin for DVT.  Sedation maintained with midazolam. Levetiracetam was initiated.  Reversal agents were not available prior to her transfer to the [facility] ED.    She was given [facility] in the [facility] ED as well as vitamin k iv.  She was not following commands but moving during attempted CT.  Chemical paralytics given in addition to sedation.  Repeat CT did not reveal any definitive recurrent hemorrhage but the temporal horns of the lateral ventricles, third ventricle, aqueduct of Sylvius, and fourth ventricle were larger.  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          Past Medical History    Diagnosis Date   ??? Hypothyroid    ??? DVT (deep venous thrombosis) (HCC)    ??? Warfarin anticoagulation         Past Surgical History   Procedure Laterality Date   ??? Hx hysterectomy     ??? Hx thyroidectomy         History     Social History   ??? Marital Status: WIDOWED     Spouse Name: N/A   ??? Number of Children: N/A   ??? Years of Education: N/A     Occupational History   ??? Not on file.     Social History Main Topics   ??? Smoking status: Never Smoker    ??? Smokeless tobacco: Not on file   ??? Alcohol Use: No   ??? Drug Use: No   ??? Sexual Activity: Not on file     Other Topics Concern   ??? Not on file     Social History Narrative   ??? No narrative on file        History reviewed. No pertinent family history.     No Known Allergies     Current Facility-Administered Medications   Medication Dose Route Frequency   ??? levETIRAcetam (KEPPRA) 500 mg/5 mL injection       ??? phytonadione (vitamin K1) (AQUA-MEPHYTON) 10 mg/mL injection       ???  heparinized saline 3 units/mL infusion 3,000 Units  1,000 mL Irrigation ONCE   ??? heparinized saline 3 units/mL infusion 3,000 Units  1,000 mL IntraVENous ONCE   ??? heparinized saline 3 units/mL infusion  20 mL/hr IntraarTERial CONTINUOUS   ??? heparinized saline 3 units/mL infusion  20 mL/hr IntraarTERial CONTINUOUS   ??? iodixanol (VISIPAQUE) 270 mg iodine/mL contrast injection 50-100 mL  50-100 mL IntraarTERial ONCE   ??? iodixanol (VISIPAQUE) 270 mg iodine/mL contrast injection 100-150 mL  100-150 mL IntraarTERial ONCE   ??? heparinized saline 3 units/mL infusion  20 mL/hr IntraarTERial CONTINUOUS   ??? heparinized saline 3 units/mL infusion  20 mL/hr IntraarTERial CONTINUOUS   ??? iopamidol (ISOVUE 250) 51 % contrast injection 1-50 mL  1-50 mL IntraarTERial ONCE   ??? lidocaine (XYLOCAINE) 20 mg/mL (2 %) injection 20-1,000 mg  1-50 mL SubCUTAneous Multiple   ??? verapamil (ISOPTIN) 2.5 mg/mL injection 2.5-5 mg  2.5-5 mg IntraarTERial Multiple   ??? niCARdipine (CARDENE) 25 mg in 0.9% sodium chloride 250 mL infusion   0-15 mg/hr IntraVENous TITRATE     Current Outpatient Prescriptions   Medication Sig   ??? WARFARIN SODIUM (COUMADIN PO) Take  by mouth.   ??? PAROXETINE HCL (PAXIL PO) Take  by mouth.        Review of Systems:   Unable to obtain due to coma.    Data:  CT, CTA as above.  PCXR increased pulmonary edema compared to 4 hours prior.    Recent Results (from the past 24 hour(s))   CBC WITH AUTOMATED DIFF    Collection Time: 11/28/14 12:30 AM   Result Value Ref Range    WBC 11.5 4.6 - 13.2 K/uL    RBC 4.12 (L) 4.20 - 5.30 M/uL    HGB 11.5 (L) 12.0 - 16.0 g/dL    HCT 78.2 95.6 - 21.3 %    MCV 85.9 74.0 - 97.0 FL    MCH 27.9 24.0 - 34.0 PG    MCHC 32.5 31.0 - 37.0 g/dL    RDW 08.6 57.8 - 46.9 %    PLATELET 222 135 - 420 K/uL    MPV 10.4 9.2 - 11.8 FL    NEUTROPHILS 63 40 - 73 %    LYMPHOCYTES 26 21 - 52 %    MONOCYTES 10 3 - 10 %    EOSINOPHILS 1 0 - 5 %    BASOPHILS 0 0 - 2 %    ABS. NEUTROPHILS 7.3 1.8 - 8.0 K/UL    ABS. LYMPHOCYTES 3.0 0.9 - 3.6 K/UL    ABS. MONOCYTES 1.1 0.05 - 1.2 K/UL    ABS. EOSINOPHILS 0.1 0.0 - 0.4 K/UL    ABS. BASOPHILS 0.0 0.0 - 0.06 K/UL    DF AUTOMATED     PROTHROMBIN TIME + INR    Collection Time: 11/28/14 12:30 AM   Result Value Ref Range    Prothrombin time 25.6 (H) 11.5 - 15.2 sec    INR 2.3 (H) 0.8 - 1.2     PTT    Collection Time: 11/28/14 12:30 AM   Result Value Ref Range    aPTT 39.9 (H) 24.6 - 37.7 SEC   METABOLIC PANEL, BASIC    Collection Time: 11/28/14 12:30 AM   Result Value Ref Range    Sodium 143 136 - 145 mmol/L    Potassium 2.9 (LL) 3.5 - 5.5 mmol/L    Chloride 106 100 - 108 mmol/L    CO2 27 21 - 32  mmol/L    Anion gap 10 3.0 - 18 mmol/L    Glucose 184 (H) 74 - 99 mg/dL    BUN 21 (H) 7.0 - 18 MG/DL    Creatinine 1.091.18 0.6 - 1.3 MG/DL    BUN/Creatinine ratio 18 12 - 20      GFR est AA 56 (L) >60 ml/min/1.3473m2    GFR est non-AA 47 (L) >60 ml/min/1.4173m2    Calcium 7.8 (L) 8.5 - 10.1 MG/DL   CARDIAC PANEL,(CK, CKMB & TROPONIN)    Collection Time: 11/28/14 12:30 AM    Result Value Ref Range    CK 191 26 - 192 U/L    CK - MB 1.6 0.5 - 3.6 ng/ml    CK-MB Index 0.8 0.0 - 4.0 %    Troponin-I, Qt. <0.02 0.00 - 0.06 NG/ML   EKG, 12 LEAD, INITIAL    Collection Time: 11/28/14  1:09 AM   Result Value Ref Range    Ventricular Rate 77 BPM    Atrial Rate 77 BPM    P-R Interval 208 ms    QRS Duration 110 ms    Q-T Interval 450 ms    QTC Calculation (Bezet) 509 ms    Calculated P Axis 53 degrees    Calculated R Axis 62 degrees    Calculated T Axis 68 degrees    Diagnosis       Normal sinus rhythm  Prolonged QT  Abnormal ECG  No previous ECGs available     POC G3    Collection Time: 11/28/14  2:34 AM   Result Value Ref Range    Device: VENT      FIO2 (POC) 100 %    pH (POC) 7.341 (L) 7.35 - 7.45      pCO2 (POC) 45.7 (H) 35.0 - 45.0 MMHG    pO2 (POC) 139 (H) 80 - 100 MMHG    HCO3 (POC) 24.7 22 - 26 MMOL/L    sO2 (POC) 99 (H) 92 - 97 %    Base deficit (POC) 1 mmol/L    Mode ASSIST CONTROL      Tidal volume 500 ml    Set Rate 12 bpm    PEEP/CPAP (POC) 5 cmH2O    PIP (POC) 21      Allens test (POC) YES      Inspiratory Time 1 sec    Total resp. rate 16      Site RIGHT RADIAL      Specimen type (POC) ARTERIAL      Performed by Spero GeraldsMann Cash Leesa     Volume control plus YES          Vent:  Vent Settings  FIO2 (%): 100 %  CMV Rate Set: 12  Back-Up Rate: 12  Vt Set (ml): 500 ml  PEEP/VENT (cm H2O): 5 cm H20  Insp Time (sec): 1 sec  Insp Rise Time %: 50 %  Flow Trigger: 3      Vital Signs:  Patient Vitals for the past 24 hrs:   Pulse Resp BP SpO2   11/28/14 0430 65 30 139/89 mmHg 92 %   11/28/14 0355 - - 150/89 mmHg -   11/28/14 0325 (!) 57 - 130/87 mmHg 100 %   11/28/14 0242 - 16 - 100 %   11/28/14 0230 (!) 59 16 117/71 mmHg 99 %         Physical Exam:  Supine, HOB 30 degrees.  Eyelids closed; did not open to stimulation. No  commands. Pupils 3mm not reactive. 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  No corneal. No gag. Spontaneous respiration 24 over rate of 12  on ventilator. Initially no movement of extremities then started to have non-purposeful movement of right more than left arm / leg along bed.  NGT coiled in mouth so removed. .Abd protruberant, soft.  Distal pulses 1+.      Assessment/Plan:    59 year-old right-handed female with SAH HH5 F3.      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  Cerebral angiography will better determine the source of bleeding and potential treatment options of which endovascular would be preferred given her overall status.  She has developed hydrocephalus and CSF diversion with an right frontal EVD can be considered once anticoagulation has been reversed.      Blood pressure control.  AED given until source of bleeding secured. Nimodipine for 21 days.  Minimize sedation to maintain safety on ventilator.    The critical nature of her illness as well as the benefits, risks, and alternatives to ventriculostomy placement, and cerebral angiography and endovascular treatment of bleeding source were discussed with her sister and niece.  Written information was provided.  All immediate questions were answered.  Her sister consents to proceed.  Her son is coming from [place] and her daughter from [place].    2.)  Cardiovascular:  Normalize BP.  Check enzymes and BNP with developing CHF.  Diuresis as needed.  TTE to assess RWMA and valvular function after procedure.    3.)  Pulmonary:  Adjust vent to maintain normoxia and eucapnia.  Nebs standing and as needed.  Diuresis as above.    4.)  Endocrine:  Check TFTs; replete as needed.  Check A1c and cortisol.    5.)  Hematology:  Recheck INR; further reversal if INR > 1.4 prior to procedures.  Check ART as well; platelets if ART < 550.  Try to find old records for DVT.  No SCDs for now.  No pharmacologic DVT prophylaxis or treatment for now.    6.)  Fluids, Electrolytes, and Nutrition: Recheck serum K after  ventilation.  Normotonic hydration; may need hypertonic if cerebral edema is problematic.     7.)  Gastrointestinal:  PPI. Hemoccult stools.  Antimetics.    8.)  LTD: Temp foley. Radial a-line.  LCFV CVL until PICC verified.  DHT post procedure.    9.)  ID: Periprocedure cefazolin.  Dual antimicrobial EVD catheter.  Antipyretics to maintain euthermia.   Studies of T > 101.5.      110 minutes of neurocritical care time was provided independent of procedures.      Andrey Cota, MD

## 2014-11-28 NOTE — Progress Notes (Addendum)
Problem: Ventilator Management  Goal: *Adequate oxygenation and ventilation  Outcome: Progressing Towards Goal  Received patient intubated from neurosurgerical unit    Concerns: High FIO2 and peep to maintain SPO2    Plan: Settle patient on vent , check x-ray , check ABG    Goal : Reduce FIO2, peep to maintain PO2      1640 Progressing to goals  - ABG 7.365/34.3 PCO2 / 203 PO2 / 19.6 HCO3   - Peep to 8 / FIO2 to 60 following ABG and pressure concerns    Plan: Repeat ABG, reposition ET per x-ray , give prn albuterol ( aspiration concerns )  -

## 2014-11-29 ENCOUNTER — Inpatient Hospital Stay: Payer: BLUE CROSS/BLUE SHIELD | Primary: Student in an Organized Health Care Education/Training Program

## 2014-11-29 ENCOUNTER — Inpatient Hospital Stay
Admit: 2014-11-29 | Payer: BLUE CROSS/BLUE SHIELD | Primary: Student in an Organized Health Care Education/Training Program

## 2014-11-29 ENCOUNTER — Inpatient Hospital Stay
Admit: 2014-11-30 | Payer: BLUE CROSS/BLUE SHIELD | Primary: Student in an Organized Health Care Education/Training Program

## 2014-11-29 ENCOUNTER — Encounter: Primary: Student in an Organized Health Care Education/Training Program

## 2014-11-29 LAB — BLOOD GAS, ARTERIAL POC
Base deficit (POC): 4 mmol/L
Base deficit (POC): 6 mmol/L
Base deficit (POC): 8 mmol/L
Base deficit (POC): 5 mmol/L
FIO2 (POC): 65 %
FIO2 (POC): 65 %
HCO3 (POC): 21.8 MMOL/L — ABNORMAL LOW (ref 22–26)
HCO3 (POC): 19.8 MMOL/L — ABNORMAL LOW (ref 22–26)
HCO3 (POC): 21.2 MMOL/L — ABNORMAL LOW (ref 22–26)
HCO3 (POC): 20.1 MMOL/L — ABNORMAL LOW (ref 22–26)
Inspiratory Time: 0.8 s
Inspiratory Time: 0.8 s
PEEP/CPAP (POC): 11 cmH2O
PEEP/CPAP (POC): 11 cmH2O
Patient temp.: 99
Patient temp.: 37.5
Pressure support: 10 cmH2O
Pressure support: 10 cmH2O
Set Rate: 10 {beats}/min
Set Rate: 10 {beats}/min
Tidal volume: 500 ml
Tidal volume: 500 ml
Total resp. rate: 18
Total resp. rate: 19
pCO2 (POC): 39.5 MMHG (ref 35.0–45.0)
pCO2 (POC): 46.7 MMHG — ABNORMAL HIGH (ref 35.0–45.0)
pCO2 (POC): 50.2 MMHG — ABNORMAL HIGH (ref 35.0–45.0)
pCO2 (POC): 34.1 MMHG — ABNORMAL LOW (ref 35.0–45.0)
pH (POC): 7.351 (ref 7.35–7.45)
pH (POC): 7.234 — CL (ref 7.35–7.45)
pH (POC): 7.235 — CL (ref 7.35–7.45)
pH (POC): 7.381 (ref 7.35–7.45)
pO2 (POC): 91 MMHG (ref 80–100)
pO2 (POC): 116 MMHG — ABNORMAL HIGH (ref 80–100)
pO2 (POC): 63 MMHG — ABNORMAL LOW (ref 80–100)
pO2 (POC): 76 MMHG — ABNORMAL LOW (ref 80–100)
sO2 (POC): 97 % (ref 92–97)
sO2 (POC): 87 % — ABNORMAL LOW (ref 92–97)
sO2 (POC): 98 % — ABNORMAL HIGH (ref 92–97)
sO2 (POC): 95 % (ref 92–97)

## 2014-11-29 LAB — URINALYSIS W/ RFLX MICROSCOPIC
Bilirubin: NEGATIVE
Glucose: NEGATIVE mg/dL
Ketone: NEGATIVE mg/dL
Nitrites: NEGATIVE
Protein: 30 mg/dL — AB
Specific gravity: 1.026 (ref 1.005–1.030)
Urobilinogen: 1 EU/dL (ref 0.2–1.0)
pH (UA): 5.5 (ref 5.0–8.0)

## 2014-11-29 LAB — METABOLIC PANEL, BASIC
Anion gap: 11 mmol/L (ref 3.0–18)
BUN/Creatinine ratio: 11 — ABNORMAL LOW (ref 12–20)
BUN: 11 MG/DL (ref 7.0–18)
CO2: 21 mmol/L (ref 21–32)
Calcium: 7 MG/DL — ABNORMAL LOW (ref 8.5–10.1)
Chloride: 113 mmol/L — ABNORMAL HIGH (ref 100–108)
Creatinine: 0.99 MG/DL (ref 0.6–1.3)
GFR est AA: >60 mL/min/{1.73_m2} (ref >60)
GFR est non-AA: 57 mL/min/{1.73_m2} — ABNORMAL LOW (ref >60)
Glucose: 99 mg/dL (ref 74–99)
Potassium: 4.6 mmol/L (ref 3.5–5.5)
Sodium: 145 mmol/L (ref 136–145)

## 2014-11-29 LAB — CBC W/O DIFF
HCT: 33.6 % — ABNORMAL LOW (ref 35.0–45.0)
HGB: 10.9 g/dL — ABNORMAL LOW (ref 12.0–16.0)
MCH: 27.8 PG (ref 24.0–34.0)
MCHC: 32.4 g/dL (ref 31.0–37.0)
MCV: 85.7 FL (ref 74.0–97.0)
MPV: 10.4 FL (ref 9.2–11.8)
PLATELET: 160 10*3/uL (ref 135–420)
RBC: 3.92 M/uL — ABNORMAL LOW (ref 4.20–5.30)
RDW: 14.7 % — ABNORMAL HIGH (ref 11.6–14.5)
WBC: 12.7 10*3/uL (ref 4.6–13.2)

## 2014-11-29 LAB — EKG, 12 LEAD, SUBSEQUENT
Atrial Rate: 103 {beats}/min
Calculated P Axis: 36 degrees
Calculated R Axis: 37 degrees
Calculated T Axis: 38 degrees
P-R Interval: 136 ms
Q-T Interval: 416 ms
QRS Duration: 84 ms
QTC Calculation (Bezet): 544 ms
Ventricular Rate: 103 {beats}/min

## 2014-11-29 LAB — PTT: aPTT: 32.5 s (ref 24.6–37.7)

## 2014-11-29 LAB — LACTIC ACID
Lactic acid: 2.8 MMOL/L — CR (ref 0.4–2.0)
Lactic acid: 1.2 MMOL/L (ref 0.4–2.0)

## 2014-11-29 LAB — POC ACTIVATED CLOTTING TIME
Activated Clotting Time (POC): 147 SECS — ABNORMAL HIGH (ref 79–138)
Activated Clotting Time (POC): 214 SECS — ABNORMAL HIGH (ref 79–138)
Activated Clotting Time (POC): 233 SECS — ABNORMAL HIGH (ref 79–138)
Activated Clotting Time (POC): 239 SECS — ABNORMAL HIGH (ref 79–138)

## 2014-11-29 LAB — RETICULOCYTE COUNT: Reticulocyte count: 1.5 % (ref 0.5–2.3)

## 2014-11-29 LAB — URINE MICROSCOPIC ONLY
Hyaline cast: 0 /lpf (ref 0–2)
RBC: 11 /hpf (ref 0–5)
WBC: 0 /hpf (ref 0–4)

## 2014-11-29 LAB — PROTHROMBIN TIME + INR
INR: 1.2 (ref 0.8–1.2)
Prothrombin time: 15.7 s — ABNORMAL HIGH (ref 11.5–15.2)

## 2014-11-29 LAB — GLUCOSE, CSF: Glucose,CSF: 76 MG/DL — ABNORMAL HIGH (ref 40–70)

## 2014-11-29 LAB — GLUCOSE, POC
Glucose (POC): 125 mg/dL — ABNORMAL HIGH (ref 70–110)
Glucose (POC): 94 mg/dL (ref 70–110)
Glucose (POC): 81 mg/dL (ref 70–110)

## 2014-11-29 LAB — CARDIAC PANEL,(CK, CKMB & TROPONIN)
CK - MB: 9.5 ng/ml — ABNORMAL HIGH (ref 0.5–3.6)
CK - MB: 8.4 ng/ml — ABNORMAL HIGH (ref 0.5–3.6)
CK-MB Index: 1.5 % (ref 0.0–4.0)
CK-MB Index: 0.7 % (ref 0.0–4.0)
CK: 642 U/L — ABNORMAL HIGH (ref 26–192)
CK: 1125 U/L — ABNORMAL HIGH (ref 26–192)
Troponin-I, QT: 0.49 NG/ML — ABNORMAL HIGH (ref 0.0–0.045)
Troponin-I, QT: 0.6 NG/ML — ABNORMAL HIGH (ref 0.0–0.045)

## 2014-11-29 LAB — CELL COUNT, CSF

## 2014-11-29 LAB — MAGNESIUM: Magnesium: 3 mg/dL — ABNORMAL HIGH (ref 1.8–2.4)

## 2014-11-29 LAB — IRON PROFILE
Iron % saturation: 6 %
Iron: 15 ug/dL — ABNORMAL LOW (ref 50–175)
TIBC: 257 ug/dL (ref 250–450)

## 2014-11-29 LAB — FERRITIN: Ferritin: 155 NG/ML (ref 8–388)

## 2014-11-29 LAB — FOLATE: Folate: 5.9 ng/mL (ref 5.38–24.0)

## 2014-11-29 LAB — PHOSPHORUS
Phosphorus: 0.7 MG/DL — ABNORMAL LOW (ref 2.5–4.9)
Phosphorus: 1.4 MG/DL — ABNORMAL LOW (ref 2.5–4.9)

## 2014-11-29 LAB — POTASSIUM: Potassium: 3.8 mmol/L (ref 3.5–5.5)

## 2014-11-29 LAB — CALCIUM, IONIZED: Ionized Calcium: 1.04 MMOL/L — ABNORMAL LOW (ref 1.12–1.32)

## 2014-11-29 LAB — VITAMIN B12: Vitamin B12: 493 pg/mL (ref 211–911)

## 2014-11-29 MED ORDER — SODIUM CHLORIDE 0.9 % IV PIGGY BACK
1000 mg | Freq: Two times a day (BID) | INTRAVENOUS | Status: DC
Start: 2014-11-29 — End: 2014-12-01
  Administered 2014-11-30 – 2014-12-01 (×3): via INTRAVENOUS

## 2014-11-29 MED ORDER — PHARMACY INFORMATION NOTE
Freq: Once | Status: AC
Start: 2014-11-29 — End: 2014-11-29
  Administered 2014-11-29: 14:00:00

## 2014-11-29 MED ORDER — SODIUM CHLORIDE 0.9 % IV PIGGY BACK
1000 mg | Freq: Two times a day (BID) | INTRAVENOUS | Status: DC
Start: 2014-11-29 — End: 2014-11-29

## 2014-11-29 MED ORDER — SODIUM CHLORIDE 0.9 % IV
INTRAVENOUS | Status: DC | PRN
Start: 2014-11-29 — End: 2014-11-29
  Administered 2014-11-29: 18:00:00 via INTRAVENOUS

## 2014-11-29 MED ORDER — HEPARIN 3,000 UNITS IN NORMAL SALINE 1000 ML
3000 units/1000 | INTRAVENOUS | Status: DC
Start: 2014-11-29 — End: 2014-11-29
  Administered 2014-11-29: 17:00:00 via INTRA_ARTERIAL

## 2014-11-29 MED ORDER — ASPIRIN 300 MG RECTAL SUPPOSITORY
300 mg | Freq: Every day | RECTAL | Status: DC
Start: 2014-11-29 — End: 2014-12-11
  Administered 2014-11-29: 23:00:00 via RECTAL

## 2014-11-29 MED ORDER — FENTANYL CITRATE (PF) 50 MCG/ML IJ SOLN
50 mcg/mL | INTRAMUSCULAR | Status: DC | PRN
Start: 2014-11-29 — End: 2014-11-29
  Administered 2014-11-29: 22:00:00 via INTRAVENOUS

## 2014-11-29 MED ORDER — PHENYLEPHRINE 10 MG/ML INJECTION
10 mg/mL | INTRAMUSCULAR | Status: DC | PRN
Start: 2014-11-29 — End: 2014-11-29
  Administered 2014-11-29: 17:00:00 via INTRAVENOUS

## 2014-11-29 MED ORDER — IODIXANOL 270 MG/ML IV SOLN
270 mg iodine/mL | Freq: Once | INTRAVENOUS | Status: DC
Start: 2014-11-29 — End: 2014-11-29
  Administered 2014-11-29: 16:00:00 via INTRA_ARTERIAL

## 2014-11-29 MED ORDER — VERAPAMIL 2.5 MG/ML IV
2.5 mg/mL | INTRAVENOUS | Status: DC | PRN
Start: 2014-11-29 — End: 2014-11-29
  Administered 2014-11-29: 19:00:00 via INTRA_ARTERIAL

## 2014-11-29 MED ORDER — PHARMACY INFORMATION NOTE
Freq: Once | Status: DC
Start: 2014-11-29 — End: 2014-11-29

## 2014-11-29 MED ORDER — HEPARIN 3,000 UNITS IN NORMAL SALINE 1000 ML
3000 units/1000 | Freq: Once | INTRAVENOUS | Status: DC
Start: 2014-11-29 — End: 2014-11-29
  Administered 2014-11-29: 16:00:00 via INTRAVENOUS

## 2014-11-29 MED ORDER — METHOHEXITAL 500 MG IV SOLR
500 mg | INTRAMUSCULAR | Status: DC
Start: 2014-11-29 — End: 2014-11-29
  Administered 2014-11-29: 18:00:00

## 2014-11-29 MED ORDER — SODIUM CHLORIDE 0.9 % IV
10 mg/mL | INTRAVENOUS | Status: DC | PRN
Start: 2014-11-29 — End: 2014-11-29
  Administered 2014-11-29: 17:00:00 via INTRAVENOUS

## 2014-11-29 MED ORDER — SODIUM CHLORIDE 0.9 % IV
INTRAVENOUS | Status: DC
Start: 2014-11-29 — End: 2014-11-29
  Administered 2014-11-29: 14:00:00 via INTRAVENOUS

## 2014-11-29 MED ORDER — IOPAMIDOL 51 % IV SOLN
250 mg iodine /mL (51 %) | Freq: Once | INTRAVENOUS | Status: DC
Start: 2014-11-29 — End: 2014-11-29
  Administered 2014-11-29: 17:00:00 via INTRA_ARTERIAL

## 2014-11-29 MED ORDER — MEROPENEM 1 GRAM IV SOLR
1 gram | Freq: Three times a day (TID) | INTRAVENOUS | Status: DC
Start: 2014-11-29 — End: 2014-12-06
  Administered 2014-11-29 – 2014-12-06 (×21): via INTRAVENOUS

## 2014-11-29 MED ORDER — HEPARIN 3,000 UNITS IN NORMAL SALINE 1000 ML
3000 units/1000 | INTRAVENOUS | Status: DC
Start: 2014-11-29 — End: 2014-11-29
  Administered 2014-11-29: 16:00:00 via INTRA_ARTERIAL

## 2014-11-29 MED ORDER — HEPARIN (PORCINE) 1,000 UNIT/ML IJ SOLN
1000 unit/mL | INTRAMUSCULAR | Status: DC | PRN
Start: 2014-11-29 — End: 2014-11-29
  Administered 2014-11-29: 19:00:00 via INTRAVENOUS

## 2014-11-29 MED ORDER — HEPARIN 3,000 UNITS IN NORMAL SALINE 1000 ML
3000 units/1000 | INTRAVENOUS | Status: DC
Start: 2014-11-29 — End: 2014-11-29
  Administered 2014-11-29: 20:00:00 via INTRA_ARTERIAL

## 2014-11-29 MED ORDER — PHENYLEPHRINE 90 MG/250ML INFUSION
90 mg/ 250 ml | INTRAVENOUS | Status: DC
Start: 2014-11-29 — End: 2014-12-02
  Administered 2014-11-29 – 2014-12-01 (×32): via INTRAVENOUS

## 2014-11-29 MED ORDER — SODIUM CHLORIDE 0.9 % IV
2510 mg/10 mL | INTRAVENOUS | Status: DC
Start: 2014-11-29 — End: 2014-12-01

## 2014-11-29 MED ORDER — LIDOCAINE (PF) 10 MG/ML (1 %) IJ SOLN
10 mg/mL (1 %) | INTRAMUSCULAR | Status: AC
Start: 2014-11-29 — End: 2014-11-30
  Administered 2014-11-29: 18:00:00

## 2014-11-29 MED ORDER — POTASSIUM & SODIUM PHOSPHATES 280 MG-160 MG-250 MG ORAL POWDER PACKET
280-160-250 mg | ORAL | Status: AC
Start: 2014-11-29 — End: 2014-11-29
  Administered 2014-11-29 (×5): via ORAL

## 2014-11-29 MED ORDER — HEPARIN 3,000 UNITS IN NORMAL SALINE 1000 ML
3000 units/1000 | Freq: Once | INTRAVENOUS | Status: DC
Start: 2014-11-29 — End: 2014-11-29
  Administered 2014-11-29: 16:00:00

## 2014-11-29 MED ORDER — CALCIUM GLUCONATE 100 MG/ML (10%) IV SOLN
100 mg/mL (10%) | INTRAVENOUS | Status: DC
Start: 2014-11-29 — End: 2014-11-29
  Administered 2014-11-29: 18:00:00

## 2014-11-29 MED ORDER — NICARDIPINE IN SODIUM CHLORIDE(ISO-OSMOTIC) 20 MG/200 ML IV PIGGY BACK
INTRAVENOUS | Status: DC
Start: 2014-11-29 — End: 2014-11-29
  Administered 2014-11-29: 18:00:00

## 2014-11-29 MED ORDER — DEXTROSE 5% IN NORMAL SALINE IV
INTRAVENOUS | Status: DC
Start: 2014-11-29 — End: 2014-12-01
  Administered 2014-11-29 – 2014-12-01 (×4): via INTRAVENOUS

## 2014-11-29 MED ORDER — REMIFENTANIL 1 MG IV SOLR
1 mg | INTRAVENOUS | Status: DC | PRN
Start: 2014-11-29 — End: 2014-11-29
  Administered 2014-11-29 (×7): via INTRAVENOUS

## 2014-11-29 MED ORDER — ASPIRIN 81 MG CHEWABLE TAB
81 mg | Freq: Every day | ORAL | Status: DC
Start: 2014-11-29 — End: 2014-12-12
  Administered 2014-11-30 – 2014-12-12 (×13): via ORAL

## 2014-11-29 MED ORDER — NIMODIPINE 30 MG CAP
30 mg | ORAL | Status: DC
Start: 2014-11-29 — End: 2014-12-12
  Administered 2014-11-29 – 2014-12-12 (×148): via ORAL

## 2014-11-29 MED ORDER — POTASSIUM CHLORIDE 20 MEQ/50 ML IV PIGGY BACK
20 mEq/50 mL | Freq: Once | INTRAVENOUS | Status: AC
Start: 2014-11-29 — End: 2014-11-29
  Administered 2014-11-29: 03:00:00 via INTRAVENOUS

## 2014-11-29 MED ORDER — SODIUM CHLORIDE 0.9 % IV
10 mg/mL | INTRAVENOUS | Status: DC | PRN
Start: 2014-11-29 — End: 2014-11-29
  Administered 2014-11-29: 19:00:00 via INTRAVENOUS

## 2014-11-29 MED ORDER — SODIUM CHLORIDE 0.9 % IV
3 mmol/mL | Freq: Once | INTRAVENOUS | Status: AC
Start: 2014-11-29 — End: 2014-11-29
  Administered 2014-11-29: 12:00:00 via INTRAVENOUS

## 2014-11-29 MED ORDER — SODIUM CHLORIDE 0.9 % IV
10 gram | Freq: Once | INTRAVENOUS | Status: AC
Start: 2014-11-29 — End: 2014-11-29
  Administered 2014-11-29: 15:00:00 via INTRAVENOUS

## 2014-11-29 MED ORDER — REMIFENTANIL 1 MG IV SOLR
1 mg | INTRAVENOUS | Status: AC
Start: 2014-11-29 — End: ?

## 2014-11-29 MED ORDER — PROPOFOL INFUSION
10 mg/mL | INTRAVENOUS | Status: DC | PRN
Start: 2014-11-29 — End: 2014-11-29
  Administered 2014-11-29 (×2): via INTRAVENOUS

## 2014-11-29 MED ORDER — PHENYLEPHRINE 10 MG/ML INJECTION
10 mg/mL | INTRAMUSCULAR | Status: AC
Start: 2014-11-29 — End: 2014-11-29
  Administered 2014-11-29: 06:00:00

## 2014-11-29 MED ORDER — LABETALOL 5 MG/ML IV SOLN
5 mg/mL | INTRAVENOUS | Status: DC | PRN
Start: 2014-11-29 — End: 2014-12-02

## 2014-11-29 MED ORDER — IODIXANOL 270 MG/ML IV SOLN
270 mg iodine/mL | Freq: Once | INTRAVENOUS | Status: DC
Start: 2014-11-29 — End: 2014-11-29
  Administered 2014-11-29: 20:00:00 via INTRA_ARTERIAL

## 2014-11-29 MED ORDER — PHARMACY VANCOMYCIN NOTE
Freq: Once | Status: AC
Start: 2014-11-29 — End: 2014-11-30
  Administered 2014-12-01: 02:00:00

## 2014-11-29 MED FILL — SODIUM CHLORIDE 0.9 % INJECTION: INTRAMUSCULAR | Qty: 10

## 2014-11-29 MED FILL — PRECEDEX 100 MCG/ML INTRAVENOUS SOLUTION: 100 mcg/mL | INTRAVENOUS | Qty: 4

## 2014-11-29 MED FILL — POTASSIUM CHLORIDE 20 MEQ/50 ML IV PIGGY BACK: 20 mEq/50 mL | INTRAVENOUS | Qty: 50

## 2014-11-29 MED FILL — LEVETIRACETAM 500 MG/5 ML IV SOLN: 500 mg/5 mL | INTRAVENOUS | Qty: 7.5

## 2014-11-29 MED FILL — PHARMACY INFORMATION NOTE: Qty: 1

## 2014-11-29 MED FILL — HEPARIN 3,000 UNITS IN NORMAL SALINE 1000 ML: 3000 units/1000 | INTRAVENOUS | Qty: 1000

## 2014-11-29 MED FILL — PHOS-NAK 280 MG-160 MG-250 MG ORAL POWDER PACKET: 280-160-250 mg | ORAL | Qty: 2

## 2014-11-29 MED FILL — PHENYLEPHRINE 90 MG/250ML INFUSION: 90 mg/ 250 ml | INTRAVENOUS | Qty: 250

## 2014-11-29 MED FILL — PHENYLEPHRINE 10 MG/ML INJECTION: 10 mg/mL | INTRAMUSCULAR | Qty: 1

## 2014-11-29 MED FILL — ACEPHEN 650 MG RECTAL SUPPOSITORY: 650 mg | RECTAL | Qty: 1

## 2014-11-29 MED FILL — DEXTROSE 5% IN NORMAL SALINE IV: INTRAVENOUS | Qty: 1000

## 2014-11-29 MED FILL — VISIPAQUE 270 MG IODINE/ML INTRAVENOUS SOLUTION: 270 mg iodine/mL | INTRAVENOUS | Qty: 100

## 2014-11-29 MED FILL — BREVITAL 500 MG SOLUTION FOR INJECTION: 500 mg | INTRAMUSCULAR | Qty: 500

## 2014-11-29 MED FILL — PHARMACY VANCOMYCIN NOTE: Qty: 1

## 2014-11-29 MED FILL — VISIPAQUE 270 MG IODINE/ML INTRAVENOUS SOLUTION: 270 mg iodine/mL | INTRAVENOUS | Qty: 200

## 2014-11-29 MED FILL — SODIUM CHLORIDE 0.9 % IV PIGGY BACK: INTRAVENOUS | Qty: 50

## 2014-11-29 MED FILL — TYLENOL 325 MG TABLET: 325 mg | ORAL | Qty: 2

## 2014-11-29 MED FILL — SENNA PLUS 8.6 MG-50 MG TABLET: ORAL | Qty: 2

## 2014-11-29 MED FILL — VANCOMYCIN 10 GRAM IV SOLR: 10 gram | INTRAVENOUS | Qty: 2000

## 2014-11-29 MED FILL — MAGNESIUM SULFATE 2 GRAM/50 ML IVPB: 2 gram/50 mL (4 %) | INTRAVENOUS | Qty: 50

## 2014-11-29 MED FILL — FOLIC ACID 5 MG/ML IJ SOLN: 5 mg/mL | INTRAMUSCULAR | Qty: 0.2

## 2014-11-29 MED FILL — ULTIVA 1 MG INTRAVENOUS SOLUTION: 1 mg | INTRAVENOUS | Qty: 1

## 2014-11-29 MED FILL — NIMODIPINE 30 MG CAP: 30 mg | ORAL | Qty: 2

## 2014-11-29 MED FILL — SODIUM PHOSPHATE 3 MMOLE/ML IV: 3 mmol/mL | INTRAVENOUS | Qty: 3

## 2014-11-29 MED FILL — CALCIUM GLUCONATE 100 MG/ML (10%) IV SOLN: 100 mg/mL (10%) | INTRAVENOUS | Qty: 20

## 2014-11-29 MED FILL — SODIUM CHLORIDE 0.9 % IV: INTRAVENOUS | Qty: 250

## 2014-11-29 MED FILL — CARDENE 20 MG/200 ML(0.1 MG/ML) IN SOD CHLOR(ISO-OSM) INTRAVENOUS SOLN: 20 mg/0 mL (0.1 mg/mL) | INTRAVENOUS | Qty: 200

## 2014-11-29 MED FILL — LIDOCAINE (PF) 10 MG/ML (1 %) IJ SOLN: 10 mg/mL (1 %) | INTRAMUSCULAR | Qty: 30

## 2014-11-29 MED FILL — SODIUM CHLORIDE 0.9 % IV: INTRAVENOUS | Qty: 1000

## 2014-11-29 MED FILL — ISOVUE-250  51 % INTRAVENOUS SOLUTION: 250 mg iodine /mL (51 %) | INTRAVENOUS | Qty: 50

## 2014-11-29 MED FILL — VERAPAMIL 2.5 MG/ML IV: 2.5 mg/mL | INTRAVENOUS | Qty: 4

## 2014-11-29 MED FILL — CHLORHEXIDINE GLUCONATE 0.12 % MOUTHWASH: 0.12 % | Qty: 15

## 2014-11-29 NOTE — Procedures (Signed)
[facility]  *** FINAL REPORT ***    Name: Ellison CarwinURCELL, Nayelli  MRN: [id number]    Inpatient  DOB: 10 Nov 1955  HIS Order #: [id number]  TRAKnet Visit #: [id number]  Date: 29 Nov 2014    TYPE OF TEST: Peripheral Venous Testing    REASON FOR TEST  H/O DVT    Right Leg:-  Deep venous thrombosis:           No  Superficial venous thrombosis:    No  Deep venous insufficiency:        Not examined  Superficial venous insufficiency: Not examined    Left Leg:-  Deep venous thrombosis:           No  Superficial venous thrombosis:    No  Deep venous insufficiency:        Not examined  Superficial venous insufficiency: Not examined      INTERPRETATION/FINDINGS  Duplex images were obtained using 2-D gray scale, color flow, and  spectral Doppler analysis.  Right leg :  1. Deep vein(s) visualized include the common femoral, deep femoral,  proximal femoral, mid femoral, distal femoral, popliteal(above knee),  popliteal(fossa), popliteal(below knee), posterior tibial and peroneal   veins.  2. No evidence of deep venous thrombosis detected in the veins  visualized.  3. No evidence of superficial thrombosis detected in the proximal  greater saphenous vein.  4. Unable to clearly visualize the peroneal vein.  Left leg :  1. Deep vein(s) visualized include the common femoral, deep femoral,  proximal femoral, mid femoral, distal femoral, popliteal(above knee),  popliteal(fossa), popliteal(below knee), posterior tibial and peroneal   veins.  2. No evidence of deep venous thrombosis detected in the veins  visualized.  3. No evidence of superficial thrombosis detected in the proximal  greater saphenous vein.    ADDITIONAL COMMENTS    I have personally reviewed the data relevant to the interpretation of  this  study.    TECHNOLOGIST: Pablo LawrenceKelly L. Chin, RVT  Signed: 11/29/2014 10:40 AM    PHYSICIAN: Quitman Livingsichard S. Dareen PianoAnderson MD  Signed: 11/29/2014 01:01 PM

## 2014-11-29 NOTE — Anesthesia Procedure Notes (Signed)
Arterial Line Placement    Start time: 11/29/2014 1:00 PM  End time: 11/29/2014 1:05 PM  Indication(s): pressure monitoring    Staffing  Performed by: anesthesiologist and personally     Timeout performed, 13:00  Arterial Line Insertion  Attempts: 1  Size: 20G  Location: left radial  Procedure Note  Arterial Line Procedure Note    Patient: Renee West MRN: [id number]  SSN: [id number]   Date of Birth: 13-Oct-1955  Age: 59 y.o.  Sex: female   Cardiovascular Function/Vital Signs  There were no vitals taken for this visit.  Referring physician:   Dorian Furnaceperator:Zi Sek L Sheryl Saintil, MD  Indication: Pressure management    Risks, benefits, alternatives explained and patient agrees to proceed.    left wrist placed on armboard.    Time out performed, correct patient, side, site, and procedure verified.   7-Step Sterility Protocol followed.  (cap, mask sterile gown, sterile gloves, large sterile sheet, hand hygiene, 2% chlorhexidine for cutaneous antisepsis)  5 mL 1% Lidocaine placed at insertion site.      Left radial artery cannulated x 1 attempt(s) 20 gauge cathter  arterial cannulation confirmed with arterial waveform  Sterile Tegaderm placed.           Abelina Bacheloravid L Efstathios Sawin, MD  11/29/2014  1:19 PM

## 2014-11-29 NOTE — Progress Notes (Addendum)
Problem: Mobility Impaired (Adult and Pediatric)  Goal: *Acute Goals and Plan of Care (Insert Text)  STG for 5.31.16 to be completed by 6.7.16 (7 days).  1. Pt will complete supine therex [facility] in order to decrease risk of contractures and maintain current strength.   PHYSICAL THERAPY EVALUATION    Patient: Renee West (21(59 y.o. female)  Date: 11/29/2014  Primary Diagnosis: SAH (subarachnoid hemorrhage) (HCC)  Precautions: bedlevel, EVD         ASSESSMENT : 59 yo female seen at bed-level for PROM BLE presents with impaired functional mobility, decreased activity tolerance, and generalized weakness s/p SAH. Unable to provide pt education at this time due to pt intubated and not responding, minimally sedated, recommend continued education when awake and to family. PROM performed to BLE, occasional spontaneous DF occurring (B), likely flexor response. Pt withdrawal to noxious stimuli in L4 dermatome (B). BLE placed in prevalon boots, SCDs returned to BLE following session, Diannia RuderKara RN aware. Pt  Recommend continued therapy during acute stay.    Patient will benefit from skilled intervention to address the above impairments.  Patient???s rehabilitation potential is considered to be Guarded  Factors which may influence rehabilitation potential include:   [ ]          None noted  [ ]          Mental ability/status  [X]          Medical condition  [ ]          Home/family situation and support systems  [ ]          Safety awareness  [ ]          Pain tolerance/management  [ ]          Other:        PLAN :Recommend continued therapy during acute stay.  Recommendations and Planned Interventions:  [X]            Bed Mobility Training             [X]     Neuromuscular Re-Education  [X]            Transfer Training                   [ ]     Orthotic/Prosthetic Training  [X]            Gait Training                          [ ]     Modalities  [X]            Therapeutic Exercises          [ ]     Edema Management/Control   [X]            Therapeutic Activities            [X]     Patient and Family Training/Education  [ ]            Other (comment):    Frequency/Duration: Patient will be followed by physical therapy 3-5 times a week on Functional Maintenance Program by rehab tech, 1-2x/wk by PT to address goals.  Discharge Recommendations: Inpatient Rehab vs Skilled Nursing Facility  Further Equipment Recommendations for Discharge: TBD when OOB       SUBJECTIVE:   Patient did not verbalize.      OBJECTIVE DATA SUMMARY:       Past Medical History   Diagnosis Date   ???  Hypothyroid     ??? DVT (deep venous thrombosis) (HCC)     ??? Warfarin anticoagulation     ??? 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 Hydrocephalus 11/28/2014   ??? EVD right frontal 11/28/2014     Past Surgical History   Procedure Laterality Date   ??? Hx hysterectomy       ??? Hx thyroidectomy         Barriers to Learning/Limitations: yes;  sensory deficits-vision/hearing/speech and altered mental status (i.e.Sedation, Confusion)  Compensate with: visual, verbal, tactile, kinesthetic cues/model  GCODES(GP):Mobility E4540 Current  CN= 100%  G8979 Goal  CK= 40-59%.  The severity rating is based on the Other clinical judgment  Prior Level of Function/Home Situation: Pt is unable to verbalize at this time, recommend follow up with family for PLOF  Critical Behavior:  Neurologic State: Sleeping  Orientation Level: Unable to verbalize  Cognition: Unable to assess (comment)  Psychosocial  Patient Behaviors: Not interactive  Purposeful Interaction: No (comment)  Skin Condition/Temp: Dry;Cool  Skin Integrity: Incision (comment)  Skin Integumentary  Skin Color: Appropriate for ethnicity  Skin Condition/Temp: Dry;Cool  Skin Integrity: Incision (comment)  Turgor: Non-tenting  Hair Growth: Present  Varicosities: Absent  Strength:    Strength: Grossly decreased, non-functional  Tone & Sensation:   Sensation:  (unable to accurately assess)   Coordination: unable to accurately assess due to mental status  Range Of Motion:  AROM: Grossly decreased, non-functional  PROM: Generally decreased, functional  Functional Mobility:  Bed Mobility:  Rolling:  (did not assess)  Supine to Sit:  (did not assess)  Transfers:  Sit to Stand:  (did not assess)  Balance:   Sitting:  (did not assess)  Standing:  (did not assess)  Ambulation/Gait Training:  Gait Description (WDL):  (did not assess)  Therapeutic Exercises:   PROM (B) x 10: DF, hip/knee flexion, hip abduction  Pain:  Pre-treatment level: did not verbalize  Location: n/a  Post-treatment level: did not verbalize  Activity Tolerance:   Poor  Please refer to the flowsheet for vital signs taken during this treatment.  After treatment:            Patient left in no apparent distress sitting up in chair           Patient left in no apparent distress in bed           Call bell left within reach           Nursing notified           Caregiver present           Bed alarm activated      COMMUNICATION/EDUCATION:            Fall prevention education was provided and the patient/caregiver indicated understanding.           Patient/family have participated as able in goal setting and plan of care.           Patient/family agree to work toward stated goals and plan of care.           Patient understands intent and goals of therapy, but is neutral about his/her participation.           Patient is unable to participate in goal setting and plan of care.    Thank you for this referral.  Luz Brazen PT, DPT   Time Calculation: 12 mins

## 2014-11-29 NOTE — Anesthesia Post-Procedure Evaluation (Signed)
Post-Anesthesia Evaluation and Assessment    Patient: Renee West MRN: [id number]  SSN: [id number]    Date of Birth: 06-Aug-1955  Age: 59 y.o.  Sex: female      Data from PACU flowsheet    Cardiovascular Function/Vital Signs  Visit Vitals   Item Reading   ??? BP 109/60 mmHg   ??? Pulse 69   ??? Temp 1.9 ??C (35.5 ??F)   ??? Resp 21   ??? Ht 5\' 6"  (1.676 m)   ??? Wt 107.5 kg (236 lb 15.9 oz)   ??? BMI 38.27 kg/m2   ??? SpO2 100%       Patient is status post general anesthesia for * No procedures listed *.    Nausea/Vomiting: controlled    Postoperative hydration reviewed and adequate.    Pain:unable to access  Pain Scale 1: Adult Nonverbal Pain Scale (11/29/14 0800)  Pain Intensity 1: 0 (11/29/14 0400)   Managed      Mental Status and Level of Consciousness:intubated    Pulmonary Status:   O2 Device: Other (comment) (11/29/14 1805)   Adequate oxygenation and airway patent    Complications related to anesthesia: None    Post-anesthesia assessment completed. No concerns    Signed By: Varney BilesANNE  Derran Sear, CRNA     Nov 29, 2014

## 2014-11-29 NOTE — Progress Notes (Signed)
[facility]     Pharmacy dosing Vancomycin IV empirically for treatment of sepsis     Neurovascular Dx: LPICA aneurysm associated SAH, hydrocephalus    Started/Was on a regimen of 2 gm IV load    Day of Therapy: 0    Other Antibiotics: Meropenem IV 1 gm q8h.    Labs:   Bun/Serum Creatinine - 11 mg/dl / 0.980.99 mg/dl  CrCl - 11.972.7 ml/min  WBC - 12.7    Cultures:    pending    Neurovascular management goals:   Fluid goal (IV and TF) 80 ml/hr.  SBP goals: 20%<initial presentation, 100-130 for antihypertensives.    Vancomycin Kinetic model:  Ke 0.0647, 1/2 life 10.7 hr, Volume of Distribution 51.1 L.  Steady state equilibrium in 30-36 hours.    Plan: Vancomycin trough goal 15-20.  Maximal concentrated (for CIV) Vancomycin 1 gm IV q12h.  Ordered Vanco-trough for 6/1 PM.  Pharmacy to follow and will make changes to dose and/or frequency based on clinical status.    Thanks for the consult.

## 2014-11-29 NOTE — Procedures (Signed)
[facility]  *** FINAL REPORT ***    Name: Renee West, Renee West  MRN: [id number]    Inpatient  DOB: 10 Nov 1955  HIS Order #: [id number]  TRAKnet Visit #: [id number]  Date: 29 Nov 2014    TYPE OF TEST: Peripheral Venous Testing    REASON FOR TEST  H/O DVT    Right Leg:-  Deep venous thrombosis:           No  Superficial venous thrombosis:    No  Deep venous insufficiency:        Not examined  Superficial venous insufficiency: Not examined    Left Leg:-  Deep venous thrombosis:           No  Superficial venous thrombosis:    No  Deep venous insufficiency:        Not examined  Superficial venous insufficiency: Not examined      INTERPRETATION/FINDINGS  Duplex images were obtained using 2-D gray scale, color flow, and  spectral Doppler analysis.  Right leg :  1. Deep vein(s) visualized include the common femoral, deep femoral,  proximal femoral, mid femoral, distal femoral, popliteal(above knee),  popliteal(fossa), popliteal(below knee), posterior tibial and peroneal   veins.  2. No evidence of deep venous thrombosis detected in the veins  visualized.  3. No evidence of superficial thrombosis detected in the proximal  greater saphenous vein.  4. Unable to clearly visualize the peroneal vein.  Left leg :  1. Deep vein(s) visualized include the common femoral, deep femoral,  proximal femoral, mid femoral, distal femoral, popliteal(above knee),  popliteal(fossa), popliteal(below knee), posterior tibial and peroneal   veins.  2. No evidence of deep venous thrombosis detected in the veins  visualized.  3. No evidence of superficial thrombosis detected in the proximal  greater saphenous vein.    ADDITIONAL COMMENTS    I have personally reviewed the data relevant to the interpretation of  this  study.    TECHNOLOGIST: Kelly L. Chin, RVT  Signed: 11/29/2014 10:40 AM    PHYSICIAN: Brice Potteiger S. Levan Aloia MD  Signed: 11/29/2014 01:01 PM

## 2014-11-29 NOTE — Progress Notes (Signed)
[facility]   Discharge Planning/Social Services Assessment    Reasons for Intervention: Patient is intubated and unable to respond to questions.  Will conduct interview once condition stabilizes and family available.  Discharge assessment per chart review.  Case management to follow for discharge needs.      High Risk Criteria   Yes  No   Physician Referral   Yes  No        Date    Nursing Referral   Yes  No        Date    Patient/Family Request   Yes  No        Date       Resources:    Medicare   Yes  No   Medicaid   Yes  No   No Resources   Yes  No   Private Insurance   Yes  No   Case Manager Name/Phone Number    Other   Yes  No        (i.e. Workman's Comp)         Prior Services:    Prior Services   Yes  No   Home Health   Yes  No   Agency    Private Home Care   Yes  No        Number of Hours    Home Care Program   Yes  No   Case Manager    Meals on Wheels   Yes  No   Office on Aging   Yes  No   Transportation Services   Yes  No   Nursing Home   Yes  No        Nursing Home Name    Rehab/VA Hospital   Yes  No        Rehab/VA Name    Other       Information Source:      Information obtained from   Patient   Parent    Guardian   Child   Spouse    Significant Other/Partner    Friend       EMS     Nursing Home Chart           Other:chart   Chart Review   Yes  No     Family/Support System:    Patient lives with   Alone     Spouse    Significant Other   Children   Caretaker    Parent   Sibling      Other       Other Support System:    Is the patient responsible for care of others   Yes  No   Information of person caring for patient on  discharge    Managers financial affairs independently   Yes  No   If no, explain:      Status Prior to Admission:    Mental Status   Awake   Alert   Oriented   Quiet/Calm  Lethargic/Sedated    Disoriented   Restless/Anxious   Combative   Personal Care   Dependent   Independent Personal Care   Requires Assistance    Meal Preparation Ability   Independent    Standby Assistance    Minimal Assistance    Moderate Assistance   Maximum Assistance      Total Assistance   Chores   Independent with Chores    N/A Nursing Home Resident  Requires Assistance   Bowel/Bladder   Continent   Catheter   Incontinent   Ostomy Self-Care     Urine Diversion Self-Care   Maximum Assistance      Total Assistance   Number of Persons needed for assistance    DME at home   [place], [place], Straight    Commode     Bathroom/Grab [facility] Bed   Nebulizer   Oxygen            Raised Toilet Seat   Shower Chair   Side Rails for Bed    Tub Transfer Bench    Walker, Building surveyor, Standard       Other:   Vendor      Treatment Presently Receiving:    Current Treatments   Chemotherapy   Dialysis   Insulin   IVAB  IVF    O2   PCA    PT    RT    Tube Feedings    Wound Care     Psychosocial Evaluation:    Verbalized Knowledge of Disease Process   Patient  Family   Coping with Disease Process   Patient  Family   Requires Further Counseling Coping with Disease Process   Patient  Family     Identified Projected Needs:    Home Health Aid   Yes  No   Transportation   Yes  No   Education   Yes  No        Specific Education     Financial Counseling   Yes  No   Inability to Care for Self/Will Require 24 hour care   Yes  No   Pain Management   Yes  No   Home Infusion Therapy   Yes  No   Oxygen Therapy   Yes  No   DME   Yes  No   Long Term Care Placement   Yes  No   Rehab   Yes  No   Physical Therapy   Yes  No   Needs Anticipated At This Time   Yes  No     Intra-Hospital Referral:    [place]   Yes  No   Life Coach   Yes  No   Patient Representative   Yes  No   Staff for Teaching Needs   Yes  No   Specialty Teaching Needs     Diabetic Educator   Yes  No   Referral for Diabetic Educator Needed   Yes  No  If Yes, place order for Nutritionist or Diabetic Consult     Tentative Discharge Plan:    Home with No Services   Yes  No    Home with Home Health Follow-up   Yes  No        If Yes, specify type    Home Care Program   Yes  No        If Yes, specify type    Meals on Wheels   Yes  No   Office of Aging   Yes  No   NHP   Yes  No   Return to the Nursing Home   Yes  No   Rehab Therapy   Yes  No   Acute Rehab   Yes  No   Subacute Rehab   Yes  No   Private Care   Yes  No   Substance Abuse Referral  Yes  No   Transportation   Yes  No   Chore Service   Yes  No   Inpatient Hospice   Yes  No   OP RT   Yes   No   OP Hemo   Yes   No   OP PT   Yes  No   Support Group   Yes  No   Reach to Recovery   Yes  No   OP Oncology Clinic   Yes  No   Clinic Appointment   Yes  No   DME   Yes  No   Comments    Name of D/C Planner or Social Worker Given to Patient or Family Lurena Nida, RN   Phone Number [phone]        Extension [phone]   Date Nov 29, 2014   Time 958 am   If you are discharged home, whom do you designate to participate in your discharge plan and receive any information needed?     Enter name of designee Renee West  sister          Phone # of designee [phone]        Address of designee         Updated Nov 29, 2014        Patient refused to designate any           individual

## 2014-11-29 NOTE — Progress Notes (Signed)
Neurovascular Progress Note      Patient: Renee LeffDorothy R Diloreto MRN: [id number]  SSN: [id number]    Date of Birth: [birth year]  Age: 59 y.o.  Sex: female      Events  PCC 4 factor Theodoro ParmaKCentra was given with INR remeasured 1.0.  EVD was placed showing elevated intracranial pressure which was brought down to the upper limits of normal range with a small amount of CSF drainage.  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  There was also an LM3 pseudoaneurysm.  The patient was having some cardiopulmonary issues and aneurysm repair was deferred.    With low normal CPP she was moving her extremities purposefully trying to pull on her ETT but not following commands.  Continuous sedation was required to maintain her safety and comfort.  Repeat CT shows stable ventricular size with right frontal ventriculostomy catheter just crossing midline with tip in the frontal horn of the left lateral ventricle.  There was interval hematoma in the right middle frontal sulcus as well as the scalp adjacent to the ventriculostomy site.       Her Aa gradient improved some but still remained significant.  Blood pressure was labile requiring iv antihypertensives at times and iv antihypertensives at other to maintain low normal CPP.    Febrile.  Cultures acquired.  Broad spectrum antimicrobials initiated. Fluid bolus given when lactic acid elevated which subsequently improved.      Vital Signs  Patient Vitals for the past 24 hrs:   Temp Pulse Resp BP SpO2   11/29/14 1200 99.5 ??F (37.5 ??C) 69 21 109/60 mmHg 100 %   11/29/14 1159 - 66 21 - 100 %   11/29/14 1130 - 64 - 111/62 mmHg 100 %   11/29/14 1100 99.5 ??F (37.5 ??C) - - 108/64 mmHg -   11/29/14 1030 - (!) 57 - 118/60 mmHg 100 %   11/29/14 1000 99.5 ??F (37.5 ??C) 63 - 111/65 mmHg 99 %   11/29/14 0930 - 68 - 100/57 mmHg 95 %   11/29/14 0900 99.5 ??F (37.5 ??C) 72 - 95/55 mmHg 95 %   11/29/14 0848 - 71 21 - 96 %    11/29/14 0800 99.3 ??F (37.4 ??C) 71 18 115/71 mmHg 99 %   11/29/14 0700 - 68 - 144/69 mmHg 98 %   11/29/14 0600 98.8 ??F (37.1 ??C) 61 - 139/79 mmHg 97 %   11/29/14 0523 - 61 18 - 97 %   11/29/14 0500 - 65 - 133/68 mmHg 98 %   11/29/14 0448 99 ??F (37.2 ??C) 60 - 118/83 mmHg 96 %   11/29/14 0300 - 67 - 133/65 mmHg 99 %   11/29/14 0215 - 65 - - 98 %   11/29/14 0200 100.4 ??F (38 ??C) 66 - 131/90 mmHg 98 %   11/29/14 0130 - 67 - - 98 %   11/29/14 0115 - 66 - - 98 %   11/29/14 0100 (!) 100.6 ??F (38.1 ??C) 73 - 101/55 mmHg 98 %   11/29/14 0049 - 70 25 - 97 %   11/29/14 0022 - 79 - 117/77 mmHg 98 %   11/29/14 0001 - 68 - 109/75 mmHg 97 %   11/29/14 0000 (!) 101.3 ??F (38.5 ??C) 68 - - 97 %   11/28/14 2300 - 69 - 109/63 mmHg 96 %   11/28/14 2200 - 70 - (!) 82/59 mmHg 94 %   11/28/14 2115 - 69 - - 99 %  11/28/14 2100 - 83 - (!) 80/48 mmHg 98 %   11/28/14 2031 - 74 26 - 99 %   11/28/14 2000 (!) 101.5 ??F (38.6 ??C) 76 - 117/61 mmHg 99 %   11/28/14 1928 - (!) 107 - (!) 82/55 mmHg -   11/28/14 1900 (!) 101.5 ??F (38.6 ??C) 86 26 108/89 mmHg 98 %   11/28/14 1845 - 86 - - 98 %   11/28/14 1830 - 99 - (!) 86/52 mmHg -   11/28/14 1815 - 98 - - 97 %   11/28/14 1800 (!) 101.3 ??F (38.5 ??C) 97 26 90/51 mmHg 97 %   11/28/14 1745 - 98 - - 98 %   11/28/14 1730 - (!) 104 - - 96 %   11/28/14 1715 - (!) 103 - - 96 %   11/28/14 1714 - (!) 106 30 - 95 %   11/28/14 1702 - - - - 93 %   11/28/14 1700 (!) 100.8 ??F (38.2 ??C) 97 - 105/62 mmHg 96 %   11/28/14 1645 - 82 - - 94 %   11/28/14 1630 - 83 - - 93 %   11/28/14 1626 - 83 27 - 94 %   11/28/14 1615 - 69 - - 97 %   11/28/14 1600 100.4 ??F (38 ??C) 91 26 (!) 80/55 mmHg 100 %   11/28/14 1545 - 95 - - 100 %   11/28/14 1530 - 99 - - 100 %   11/28/14 1515 - (!) 102 - - 100 %   11/28/14 1500 97.4 ??F (36.3 ??C) (!) 102 - - 100 %   11/28/14 1445 - (!) 103 - - 100 %   11/28/14 1430 - (!) 101 - - 99 %   11/28/14 1415 - 97 - - 98 %   11/28/14 1400 97.2 ??F (36.2 ??C) 92 - 93/65 mmHg 99 %       NIHSS Flow Sheet   Total: 21 (11/29/14 1200)     Vent  Vent Settings  FIO2 (%): 65 %  CMV Rate Set: 13  SIMV Rate Set: 10  Back-Up Rate: 10  Vt Set (ml): 500 ml  Pressure Support (cm H2O): 10 cm H2O  PEEP/VENT (cm H2O): 11 cm H20  Insp Time (sec): 0.8 sec  Insp Rise Time %: 60 %  Pressure Trigger: 2  Flow Trigger: 3  Expiratory Sensitivity: 15 %      Intake and Output    Intake/Output Summary (Last 24 hours) at 11/29/14 1358  Last data filed at 11/29/14 1345   Gross per 24 hour   Intake 4347.5 ml   Output 2126.5 ml   Net   2221 ml       Data    Recent Results (from the past 24 hour(s))   POC G3    Collection Time: 11/28/14  4:10 PM   Result Value Ref Range    Device: VENT      FIO2 (POC) 100 %    pH (POC) 7.362 7.35 - 7.45      pCO2 (POC) 34.6 (L) 35.0 - 45.0 MMHG    pO2 (POC) 204 (H) 80 - 100 MMHG    HCO3 (POC) 19.6 (L) 22 - 26 MMOL/L    sO2 (POC) 100 (H) 92 - 97 %    Base deficit (POC) 6 mmol/L    Mode SIMV      Tidal volume 500 ml    Set Rate 13 bpm    PEEP/CPAP (POC) 12  cmH2O    Pressure support 15 cmH2O    Allens test (POC) N/A      Inspiratory Time 1.00 sec    Total resp. rate 28      Site DRAWN FROM ARTERIAL LINE      Patient temp. 37.2      Specimen type (POC) ARTERIAL      Performed by Glean Salen     Volume control plus YES     HGB & HCT    Collection Time: 11/28/14  4:40 PM   Result Value Ref Range    HGB 11.2 (L) 12.0 - 16.0 g/dL    HCT 16.1 (L) 09.6 - 45.0 %   CORTISOL    Collection Time: 11/28/14  4:40 PM   Result Value Ref Range    Cortisol, random 22.1 3.09 - 22.40 ug/dL   EKG, 12 LEAD, SUBSEQUENT    Collection Time: 11/28/14  5:01 PM   Result Value Ref Range    Ventricular Rate 103 BPM    Atrial Rate 103 BPM    P-R Interval 136 ms    QRS Duration 84 ms    Q-T Interval 416 ms    QTC Calculation (Bezet) 544 ms    Calculated P Axis 36 degrees    Calculated R Axis 37 degrees    Calculated T Axis 38 degrees    Diagnosis       Sinus tachycardia  Nonspecific ST abnormality  When compared with ECG of 28-Nov-2014 01:09,   QRS duration has decreased  Confirmed by Earl Lites [phone]) on 11/29/2014 12:42:58 PM     POC G3    Collection Time: 11/28/14  5:15 PM   Result Value Ref Range    Device: VENT      FIO2 (POC) 60 %    pH (POC) 7.385 7.35 - 7.45      pCO2 (POC) 31.7 (L) 35.0 - 45.0 MMHG    pO2 (POC) 67 (L) 80 - 100 MMHG    HCO3 (POC) 18.8 (L) 22 - 26 MMOL/L    sO2 (POC) 92 92 - 97 %    Base deficit (POC) 6 mmol/L    Mode SIMV      Tidal volume 500 ml    Set Rate 30 bpm    PEEP/CPAP (POC) 10 cmH2O    Pressure support 10 cmH2O    Allens test (POC) N/A      Inspiratory Time 0.8 sec    Total resp. rate 29      Site DRAWN FROM ARTERIAL LINE      Patient temp. 38.1      Specimen type (POC) ARTERIAL      Performed by Glean Salen     Volume control plus YES     CORTISOL    Collection Time: 11/28/14  5:20 PM   Result Value Ref Range    Cortisol, random 21.6 3.09 - 22.40 ug/dL   GLUCOSE, POC    Collection Time: 11/28/14  5:23 PM   Result Value Ref Range    Glucose (POC) 143 (H) 70 - 110 mg/dL   CORTISOL    Collection Time: 11/28/14  5:50 PM   Result Value Ref Range    Cortisol, random 24.0 (H) 3.09 - 22.40 ug/dL   CARDIAC PANEL,(CK, CKMB & TROPONIN)    Collection Time: 11/28/14  8:00 PM   Result Value Ref Range    CK 642 (H) 26 - 192 U/L    CK - MB 9.5 (  H) 0.5 - 3.6 ng/ml    CK-MB Index 1.5 0.0 - 4.0 %    Troponin-I, Qt. 0.49 (H) 0.0 - 0.045 NG/ML   POTASSIUM    Collection Time: 11/28/14  8:00 PM   Result Value Ref Range    Potassium 3.8 3.5 - 5.5 mmol/L   PHOSPHORUS    Collection Time: 11/28/14  8:00 PM   Result Value Ref Range    Phosphorus 0.7 (L) 2.5 - 4.9 MG/DL   GLUCOSE, POC    Collection Time: 11/29/14  1:16 AM   Result Value Ref Range    Glucose (POC) 125 (H) 70 - 110 mg/dL   CBC W/O DIFF    Collection Time: 11/29/14  3:15 AM   Result Value Ref Range    WBC 12.7 4.6 - 13.2 K/uL    RBC 3.92 (L) 4.20 - 5.30 M/uL    HGB 10.9 (L) 12.0 - 16.0 g/dL    HCT 96.0 (L) 45.4 - 45.0 %    MCV 85.7 74.0 - 97.0 FL    MCH 27.8 24.0 - 34.0 PG     MCHC 32.4 31.0 - 37.0 g/dL    RDW 09.8 (H) 11.9 - 14.5 %    PLATELET 160 135 - 420 K/uL    MPV 10.4 9.2 - 11.8 FL   PROTHROMBIN TIME + INR    Collection Time: 11/29/14  3:15 AM   Result Value Ref Range    Prothrombin time 15.7 (H) 11.5 - 15.2 sec    INR 1.2 0.8 - 1.2     PTT    Collection Time: 11/29/14  3:15 AM   Result Value Ref Range    aPTT 32.5 24.6 - 37.7 SEC   METABOLIC PANEL, BASIC    Collection Time: 11/29/14  3:15 AM   Result Value Ref Range    Sodium 145 136 - 145 mmol/L    Potassium 4.6 3.5 - 5.5 mmol/L    Chloride 113 (H) 100 - 108 mmol/L    CO2 21 21 - 32 mmol/L    Anion gap 11 3.0 - 18 mmol/L    Glucose 99 74 - 99 mg/dL    BUN 11 7.0 - 18 MG/DL    Creatinine 1.47 0.6 - 1.3 MG/DL    BUN/Creatinine ratio 11 (L) 12 - 20      GFR est AA >60 >60 ml/min/1.61m2    GFR est non-AA 57 (L) >60 ml/min/1.58m2    Calcium 7.0 (L) 8.5 - 10.1 MG/DL   CALCIUM, IONIZED    Collection Time: 11/29/14  3:15 AM   Result Value Ref Range    Ionized Calcium 1.04 (L) 1.12 - 1.32 MMOL/L   MAGNESIUM    Collection Time: 11/29/14  3:15 AM   Result Value Ref Range    Magnesium 3.0 (H) 1.8 - 2.4 mg/dL   PHOSPHORUS    Collection Time: 11/29/14  3:15 AM   Result Value Ref Range    Phosphorus 1.4 (L) 2.5 - 4.9 MG/DL   CARDIAC PANEL,(CK, CKMB & TROPONIN)    Collection Time: 11/29/14  3:15 AM   Result Value Ref Range    CK 1125 (H) 26 - 192 U/L    CK - MB 8.4 (H) 0.5 - 3.6 ng/ml    CK-MB Index 0.7 0.0 - 4.0 %    Troponin-I, Qt. 0.60 (H) 0.0 - 0.045 NG/ML   RETICULOCYTE COUNT    Collection Time: 11/29/14  3:15 AM   Result Value Ref Range  Reticulocyte count 1.5 0.5 - 2.3 %   IRON PROFILE    Collection Time: 11/29/14  3:15 AM   Result Value Ref Range    Iron 15 (L) 50 - 175 ug/dL    TIBC 284 132 - 440 ug/dL    Iron % saturation 6 %   FERRITIN    Collection Time: 11/29/14  3:15 AM   Result Value Ref Range    Ferritin 155 8 - 388 NG/ML   VITAMIN B12    Collection Time: 11/29/14  3:15 AM   Result Value Ref Range     Vitamin B12 493 211 - 911 pg/mL   FOLATE    Collection Time: 11/29/14  3:15 AM   Result Value Ref Range    Folate 5.9 5.38 - 24.0 ng/mL   LACTIC ACID, PLASMA    Collection Time: 11/29/14  3:15 AM   Result Value Ref Range    Lactic acid 2.8 (HH) 0.4 - 2.0 MMOL/L   CULTURE, BLOOD    Collection Time: 11/29/14  3:15 AM   Result Value Ref Range    Special Requests: NO SPECIAL REQUESTS      Culture result: NO GROWTH AFTER 3 HOURS     CULTURE, BLOOD    Collection Time: 11/29/14  3:15 AM   Result Value Ref Range    Special Requests: NO SPECIAL REQUESTS      Culture result: NO GROWTH AFTER 4 HOURS     POC G3    Collection Time: 11/29/14  6:01 AM   Result Value Ref Range    Device: VENT      FIO2 (POC) 65 %    pH (POC) 7.351 7.35 - 7.45      pCO2 (POC) 39.5 35.0 - 45.0 MMHG    pO2 (POC) 91 80 - 100 MMHG    HCO3 (POC) 21.8 (L) 22 - 26 MMOL/L    sO2 (POC) 97 92 - 97 %    Base deficit (POC) 4 mmol/L    Mode SIMV      Tidal volume 500 ml    Set Rate 10 bpm    PEEP/CPAP (POC) 11 cmH2O    Pressure support 10 cmH2O    Allens test (POC) YES      Inspiratory Time 0.8 sec    Total resp. rate 18      Site LEFT RADIAL      Patient temp. 99.0      Specimen type (POC) ARTERIAL      Performed by [place]     Volume control plus YES     GLUCOSE, POC    Collection Time: 11/29/14  6:16 AM   Result Value Ref Range    Glucose (POC) 94 70 - 110 mg/dL   GLUCOSE, CSF    Collection Time: 11/29/14  8:00 AM   Result Value Ref Range    Tube No. BAGGED SPECIMEN     Glucose,CSF 76 (H) 40 - 70 MG/DL   URINALYSIS W/ RFLX MICROSCOPIC    Collection Time: 11/29/14  9:18 AM   Result Value Ref Range    Color DARK YELLOW      Appearance CLEAR      Specific gravity 1.026 1.005 - 1.030      pH (UA) 5.5 5.0 - 8.0      Protein 30 (A) NEG mg/dL    Glucose NEGATIVE  NEG mg/dL    Ketone NEGATIVE  NEG mg/dL    Bilirubin NEGATIVE  NEG  Blood LARGE (A) NEG      Urobilinogen 1.0 0.2 - 1.0 EU/dL    Nitrites NEGATIVE  NEG      Leukocyte Esterase TRACE (A) NEG      URINE MICROSCOPIC ONLY    Collection Time: 11/29/14  9:18 AM   Result Value Ref Range    WBC 0 to 3 0 - 4 /hpf    RBC 11 to 20 0 - 5 /hpf    Epithelial cells FEW 0 - 5 /lpf    Bacteria FEW (A) NEG /hpf    Mucus 1+ (A) NEG /lpf    Hyaline Cast 0 to 1 0 - 2 /lpf   POC G3    Collection Time: 11/29/14  9:28 AM   Result Value Ref Range    Device: VENT      FIO2 (POC) 65 %    pH (POC) 7.381 7.35 - 7.45      pCO2 (POC) 34.1 (L) 35.0 - 45.0 MMHG    pO2 (POC) 76 (L) 80 - 100 MMHG    HCO3 (POC) 20.1 (L) 22 - 26 MMOL/L    sO2 (POC) 95 92 - 97 %    Base deficit (POC) 5 mmol/L    Mode SIMV      Tidal volume 500 ml    Set Rate 10 bpm    PEEP/CPAP (POC) 11 cmH2O    Pressure support 10 cmH2O    Allens test (POC) YES      Inspiratory Time 0.8 sec    Total resp. rate 19      Site LEFT RADIAL      Patient temp. 37.5      Specimen type (POC) ARTERIAL      Performed by Dennie Maizes     Volume control plus YES     EKG, 12 LEAD, SUBSEQUENT    Collection Time: 11/29/14  9:31 AM   Result Value Ref Range    Ventricular Rate 63 BPM    Atrial Rate 63 BPM    P-R Interval 178 ms    QRS Duration 94 ms    Q-T Interval 514 ms    QTC Calculation (Bezet) 525 ms    Calculated P Axis 16 degrees    Calculated R Axis 39 degrees    Calculated T Axis 54 degrees    Diagnosis       Normal sinus rhythm  Prolonged QT  Abnormal ECG  When compared with ECG of 28-Nov-2014 17:01,  Vent. rate has decreased BY  40 BPM     LACTIC ACID, PLASMA    Collection Time: 11/29/14 11:16 AM   Result Value Ref Range    Lactic acid 1.2 0.4 - 2.0 MMOL/L   GLUCOSE, POC    Collection Time: 11/29/14 12:30 PM   Result Value Ref Range    Glucose (POC) 81 70 - 110 mg/dL        Physical Exam  Supine, intubated. Sedated.  Pupils small reaction. Spontaneous movement on right more than left extremities.  No commands. Right groin without hematoma.   Distal pulses 1+.      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     1.) Neurologic: The high risk for recurrent hemorrhage and permanent disability and death from the ruptured LVA dissecting pseudoaneurysm was discussed with her son and sister.  We also discussed the benefits, risks, and alternatives of test occlusion  to help guide deconstruction of that LVA segment versus reconstruction if there were concerns for ischemia during test occlusion.  Written information was provided.  All immediate questions were answered.  Her son consented to proceed.    Continue mild CSF drainage until pseudoaneurysm secured then additional drainage see if exam improves.  If reconstructive technique using [employer] is needed the antiplatelet regimen will need to be carefully evaluated relative to the need for temporal and/or permanent CSF diversion.      Continue blood pressure control to maintain lowe normal CPP. AED until source of bleeding secured. Nimodipine for 21 days; dose split every 2 hours. Minimize sedation to maintain safety on ventilator.    2.) Cardiovascular:  Significant fluctuations in blood pressure may be neurogenic in nature but also concern for SIRS/sepsis.  TTE no RWMA with initially normal cardiac enzymes which have elevate slightly in pressors. BNP not elevated for age. Maintain euvolemia. Control HR first before peripheral antihypertensives.     3.) Pulmonary: PCXR improving edema.  LLL atelectasis versus aspiration pneumonitis also improved.  Adjust vent to maintain normoxia and eucapnia. Nebs standing and as needed.    4.) Endocrine: Insulin scale.     5.) Hematology: LE [place] no acute DVT.  SCDs.  No pharmacologic DVT prophylaxis or treatment for now.  Iron deficiency anemia. Maintain Hgb about 10 with cerebral ischemia and myocardial ischemia. Replete iron iv when hemodynamics more stable.    6.) Fluids, Electrolytes, and Nutrition: Recheck serum K after ventilation. Normotonic hydration; may need hypertonic if cerebral edema  is problematic.     7.) Gastrointestinal: Some vomiting overnight.  Antiemtics and PPI. Hemoccult stools as iron deficiency anemia while on warfarin.    8.) LTD: Replace temp foley. Radial a-line. PICC placed.  DHT.    9.) ID: Fever of unclear etiology. PCXR improving possible LLL aspiration pneumonitis.  UA positive but possible contaminant; replace foley and recheck.   BCx pending.  CSF studies pending. Could be central but remain vigilant for infection.  Broad spectrum antibiotics to penetrate CNS with EVD in place until culture data can guide adjustement.  Antipyretics and external cooling systems if needed to maintain euthermia.  Repeat studies       80 minutes of neurocritical care time was provided independent of procedures.      Andrey Cota, MD

## 2014-11-29 NOTE — Other (Addendum)
0700: Bedside shift change report given to Dorie Rankecilia E Bardy-Gagner, RN (oncoming nurse) (with Diannia RuderKara, Pharmacist, hospitalN precepting) by Marijean BravoBaxter, J RN (off going nurse). Report included the following information SBAR, Kardex, Intake/Output and MAR.     A-line correlating within 10 of cuff pressure. Very positional, flushing sluggish, dressing wet. Will titrate pressers and antihypertensives to cuff pressure.    0900: Radiology reported NG tube coiled around hernia, needs to be removed and replaced.    1145: Pt off unit to neuro suite.    1600: Pt returned to floor. New A-line placed.    1630: New Dobhoff placed by [employer]rystal Baker.    1900: Bedside shift change report given to Enterprise ProductsBaxter, J, RN (Cabin crewoncoming nurse) by Dorie Rankecilia E Bardy-Gagner, RN   (offgoing nurse). Report included the following information SBAR, Kardex, Intake/Output, MAR and Cardiac Rhythm NSR.

## 2014-11-29 NOTE — Anesthesia Pre-Procedure Evaluation (Signed)
Anesthetic History   No history of anesthetic complications            Review of Systems / Medical History  Patient summary reviewed and pertinent labs reviewed    Pulmonary  Within defined limits                 Neuro/Psych   Within defined limits           Cardiovascular  Within defined limits                Exercise tolerance: >4 METS     GI/Hepatic/Renal  Within defined limits              Endo/Other      Hypothyroidism       Other Findings              Physical Exam    Airway    TM Distance: 4 - 6 cm  Neck ROM: normal range of motion   Mouth opening: Normal  Intubated   Cardiovascular  Regular rate and rhythm,  S1 and S2 normal,  no murmur, click, rub, or gallop  Rhythm: regular  Rate: normal         Dental    Dentition: Lower dentition intact and Upper dentition intact  Comments: Incomplete exam   Pulmonary  Breath sounds clear to auscultation               Abdominal  GI exam deferred       Other Findings            Anesthetic Plan    ASA: 4  Anesthesia type: general    Monitoring Plan: Arterial line      Induction: Intravenous  Anesthetic plan and risks discussed with: Patient

## 2014-11-29 NOTE — Progress Notes (Signed)
Transported patient to and from CT.  Transport went ok. Patient was placed back on the vent. HR 65 RR 19  SPO2 97

## 2014-11-29 NOTE — Progress Notes (Signed)
Problem: Discharge Planning  Goal: *Discharge to safe environment  Outcome: Progressing Towards Goal  Plan is to discharge to a safe environment

## 2014-11-29 NOTE — Progress Notes (Addendum)
Problem: Ventilator Management  Goal: *Adequate oxygenation and ventilation  Outcome: Progressing Towards Goal    Received pt on ventilator. Settings: SIMV 10, 500/65%/+11. CASS tube cleared, ETT patent and secure. AMBU bag and mask at bedside.     Results for Renee West, Renee West (MRN [id number]) as of 11/29/2014 10:14   Ref. Range 11/29/2014 09:28   pH (POC) Latest Ref Range: 7.35-7.45   7.381   pCO2 (POC) Latest Ref Range: 35.0-45.0 MMHG 34.1 (L)   pO2 (POC) Latest Ref Range: 80-100 MMHG 76 (L)   HCO3 (POC) Latest Ref Range: 22-26 MMOL/L 20.1 (L)   sO2 (POC) Latest Ref Range: 92-97 % 95   Base deficit (POC) Latest Units: mmol/L 5   FIO2 (POC) Latest Units: % 65   Patient temp. Latest Units:   37.5   Specimen type (POC) Latest Units:   ARTERIAL   Set Rate Latest Units: bpm 10   Site Latest Units:   LEFT RADIAL   Device: Latest Units:   VENT   Total resp. rate Latest Units:   19   Mode Latest Units:   SIMV   Tidal volume Latest Units: ml 500   Volume control plus Latest Units:   YES   PEEP/CPAP (POC) Latest Units: cmH2O 11   Pressure support Latest Units: cmH2O 10   Allens test (POC) Latest Units:   YES   Inspiratory Time Latest Units: sec 0.8     X-ray:  Impression:  ??  1. Endotracheal tube and right peripherally inserted central catheter in  position as above.  2. Nasoenteric feeding tube looped within a large hiatal hernia, weighted tip  near the GE junction. This finding discussed with Diannia RuderKara, neuro ICU nurse caring  for the patient by Dr. Karl LukeSlat at 8:51 AM on 11/29/2014.  3. Mild persistent but improved perihilar opacities, which may reflect improved  atelectasis or pulmonary edema.    1800  Pt back from surgery. Placed on previous vent settings. Respiratory will continue to monitor.    ??

## 2014-11-29 NOTE — Progress Notes (Signed)
-  1238-Pt. Transported to OR via manual ventilation by anesthesia at this time.-DMH

## 2014-11-29 NOTE — Progress Notes (Signed)
Received pt on ventilator with 7.5 ETT and 26 at the lip. Settings: SIMV 10, 500, 65%, +11.  CASS tube cleared, ETT patent and secure. Ambu bag and mask at bedside. Pt Coarsed/diminished SpO2 100%. Will continue to monitor patient closely.

## 2014-11-29 NOTE — Brief Op Note (Addendum)
NEUROVASCULAR BRIEF OPERATIVE NOTE    Renee West    Date of Procedure:  11/29/2014    Surgeon:  Andrey CotaJOHN R Rahshawn Remo, MD    Preoperative Diagnosis: SAH, LVA ruptured dissecting pseudoaneurysm    Postoperative Diagnosis: same     Procedure: LVA pseudoaneurysm endovascular test occlusion, LVA pseudoaneurysm endovascular sacrifice deconstruction, cerebral angiography    Anesthesia: tiva    Specimens: none    Implants: MVP 5mm, 7 detachable platinum coils    Estimated Blood Loss: minimal    Findings:    No neurophysiologic monitoring changes during test occlusion.  LVA dissecting 3.156mm fusiform pseudoaneurysm with antegrade stasis after embolization. LVA fills retrograde from VBJ into LPICA during RVA injection but no pseudoaneurysm filling.    Complications: none immediate

## 2014-11-29 NOTE — Progress Notes (Signed)
Speech Therapy Note:    Attempted to see pt this am, however, pt continues on ventilator, and not appropriate for evaluation. No family at bedside for education; education ongoing with interdisciplinary staff.     Hawley Pavia A. Yates DecampCardoso, M.S., CCC-SLP  Office: [phone]  Pager: [phone]

## 2014-11-29 NOTE — Anesthesia Procedure Notes (Addendum)
Arterial Line Placement    Start time: 11/29/2014 1:00 PM  End time: 11/29/2014 1:05 PM  Indication(s): pressure monitoring    Staffing  Performed by: anesthesiologist and personally     Timeout performed, 13:00  Arterial Line Insertion  Attempts: 1  Size: 20G  Location: left radial  Procedure Note  Arterial Line Procedure Note    Patient: Renee West MRN: [id number]  SSN: [id number]   Date of Birth: 11/19/1955  Age: 59 y.o.  Sex: female   Cardiovascular Function/Vital Signs  There were no vitals taken for this visit.  Referring physician:   Operator:Zaniyah Wernette L Giavana Rooke, MD  Indication: Pressure management    Risks, benefits, alternatives explained and patient agrees to proceed.    left wrist placed on armboard.    Time out performed, correct patient, side, site, and procedure verified.   7-Step Sterility Protocol followed.  (cap, mask sterile gown, sterile gloves, large sterile sheet, hand hygiene, 2% chlorhexidine for cutaneous antisepsis)  5 mL 1% Lidocaine placed at insertion site.      Left radial artery cannulated x 1 attempt(s) 20 gauge cathter  arterial cannulation confirmed with arterial waveform  Sterile Tegaderm placed.           Kensington Rios L Jediah Horger, MD  11/29/2014  1:19 PM

## 2014-11-29 NOTE — Consults (Signed)
INFECTIOUS DISEASE CONSULT NOTE       Requested by: Dr Excell Seltzer    Reason for consult: Fever    Date of admission: 11/28/2014    Date of consult: Nov 29, 2014      ABX:     Current abx Prior abx    Vanco, Meropenem 5/30-1      ASSESSMENT: -- RECOMMENDATIONS      Fever- starting 5/30  - Source is unclear  - Blcx are negative. UA initially was neg but now with pyuria. CSF g/s neg. CXR appear more fluid overload rather than infiltrates  - Lines and EVD very new   - Agree with CNS dose Abx for now while waiting on cx results  - This can be central too with blood in ventricles but need to r/o infection  - will streamline with more cx data and clinical response   Mild leucocytosis - monitor   SAH, hydrocephalus- s/p cerebral angiography, right frontal ventriculostomy, LCFV CVL 5/30 by Dr Excell Seltzer  - findings of LVA dissecting 3mm fusiform pseudoaneurysm and LM3 dissecting 2mm pseudoaneurysm  - s/p EVD placement   - Mx per Dr Excell Seltzer and Dr Su Ley   Resp failure- s/p intubation -5/30 - per others   H/o DVT on coumadin  - currently on hold - PVL not s/o acute thrombosis  - Mx per otehrs   Hypothyroiod              MICROBIOLOGY:   5/30 ucx ntd  5/31 Blcx x2 NTD, CSF g/s neg    LINES/DRAINS:   EVD 5/30  PICC 5/30    HPI:     The patient is a 59 year WF from [place] who was visiting this area with her family. The patient suddenly stopped talking and did not respond to verbal or tactile stimulation.??There was blood coming from her mouth.??family called 911 and she was brought to the [facility]. The patient became more aroused and was combative. She didn't follow any commands and had respiratory distress.?? She was intubated.?? CT showed diffuse SAH especially in the posterior fossa. The ventricles were small with sulcal crowding consistent with cerebral edema.??Dr. Su Ley was contacted.?? She has INR of 2.3 as she was on warfarin for DVT.?? She was brought to [facility] ED.   Her anti- coagulation was reversed. Repeat CT did not reveal any definitive recurrent hemorrhage but the temporal horns of the lateral ventricles, third ventricle, aqueduct of Sylvius, and fourth ventricle were larger.?? 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 Pt also had EVD placement. She satrted spiking fever and Abx were started. We were asked for further eval and Mx.       Past medical history:     Past Medical History   Diagnosis Date   ??? Hypothyroid    ??? DVT (deep venous thrombosis) (HCC)    ??? Warfarin anticoagulation    ??? 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 Hydrocephalus 11/28/2014   ??? EVD right frontal 11/28/2014       Social History:     History     Social History   ??? Marital Status: WIDOWED     Spouse Name: N/A   ??? Number of Children: N/A   ??? Years of Education: N/A     Occupational History   ??? Not on file.     Social History Main Topics   ??? Smoking status: Never Smoker    ??? Smokeless tobacco: Not on file   ??? Alcohol Use:  No   ??? Drug Use: No   ??? Sexual Activity: Not on file     Other Topics Concern   ??? Not on file     Social History Narrative   ??? No narrative on file       Family History:   History reviewed. No pertinent family history.    Allergies:   No Known Allergies      Home Medications:     Prescriptions prior to admission   Medication Sig   ??? WARFARIN SODIUM (COUMADIN PO) Take  by mouth.   ??? PAROXETINE HCL (PAXIL PO) Take  by mouth.       Current Medications:     Current Facility-Administered Medications   Medication Dose Route Frequency   ??? dextrose 5% and 0.9% NaCl infusion  0-80 mL/hr IntraVENous CONTINUOUS   ??? 0.9% sodium chloride infusion 1,000 mL  1,000 mL IntraVENous CONTINUOUS   ??? vancomycin (VANCOCIN) 2,000 mg in 0.9% sodium chloride 500 mL IVPB  2,000 mg IntraVENous ONCE   ??? meropenem (MERREM) 1 g in 0.9% sodium chloride (MBP/ADV) 50 mL MBP  1 g IntraVENous Q8H   ??? niMODipine (NIMOTOP) capsule 30 mg  30 mg Oral Q2H    ??? heparinized saline 3 units/mL infusion 3,000 Units  1,000 mL Irrigation ONCE   ??? heparinized saline 3 units/mL infusion 3,000 Units  1,000 mL IntraVENous ONCE   ??? heparinized saline 3 units/mL infusion  20 mL/hr IntraarTERial CONTINUOUS   ??? heparinized saline 3 units/mL infusion  20 mL/hr IntraarTERial CONTINUOUS   ??? iodixanol (VISIPAQUE) 270 mg iodine/mL contrast injection 50-100 mL  50-100 mL IntraarTERial ONCE   ??? iodixanol (VISIPAQUE) 270 mg iodine/mL contrast injection 100-150 mL  100-150 mL IntraarTERial ONCE   ??? heparinized saline 3 units/mL infusion  20 mL/hr IntraarTERial CONTINUOUS   ??? heparinized saline 3 units/mL infusion  20 mL/hr IntraarTERial CONTINUOUS   ??? iopamidol (ISOVUE 250) 51 % contrast injection 1-50 mL  1-50 mL IntraarTERial ONCE   ??? verapamil (ISOPTIN) 2.5 mg/mL injection 2.5-5 mg  2.5-5 mg IntraarTERial Multiple   ??? [START ON 11/30/2014] VANCOMYCIN INFORMATION NOTE   Other ONCE   ??? vancomycin (VANCOCIN) 1,000 mg in 0.9% sodium chloride (MBP/ADV) 100 mL adv  1,000 mg IntraVENous Q12H   ??? calcium gluconate 100 mg/mL (10%) injection       ??? ELECTROLYTE REPLACEMENT PROTOCOL  1 Each Other Q8H   ??? ELECTROLYTE REPLACEMENT PROTOCOL  1 Each Other Q8H   ??? ELECTROLYTE REPLACEMENT PROTOCOL  1 Each Other Q8H   ??? PHARMACY INFORMATION NOTE  1 Each Other DAILY   ??? chlorhexidine (PERIDEX) 0.12 % mouthwash 15 mL  15 mL Oral Q12H   ??? levETIRAcetam (KEPPRA) 750 mg in 0.9% sodium chloride IVPB  750 mg IntraVENous Q12H   ??? ondansetron (ZOFRAN) injection 1 mg  1 mg IntraVENous Q6H PRN   ??? acetaminophen (TYLENOL) tablet 650 mg  650 mg Oral Q4H PRN   ??? acetaminophen (TYLENOL) suppository 650 mg  650 mg Rectal Q4H PRN   ??? senna-docusate (PERICOLACE) 8.6-50 mg per tablet 2 Tab  2 Tab Oral QHS   ??? bisacodyl (DULCOLAX) tablet 5 mg  5 mg Oral DAILY PRN   ??? bisacodyl (DULCOLAX) suppository 10 mg  10 mg Rectal DAILY PRN   ??? magnesium sulfate 2 g/50 ml IVPB (premix or compounded)  2 g IntraVENous Q12H    ??? pantoprazole (PROTONIX) 40 mg in sodium chloride 0.9 % 10 mL injection  40 mg IntraVENous Q12H   ??? albuterol (PROVENTIL VENTOLIN) nebulizer solution 2.5 mg  2.5 mg Nebulization Q4H PRN   ??? folic acid (FOLVITE) 1 mg, thiamine (B-1) 100 mg in 0.9% sodium chloride 50 mL ivpb   IntraVENous DAILY   ??? 0.9% sodium chloride infusion  5 mL/hr IntraVENous CONTINUOUS   ??? dexmedetomidine (PRECEDEX) 400 mcg in 0.9% sodium chloride 100 mL infusion  0-0.7 mcg/kg/hr IntraVENous TITRATE   ??? niCARdipine (CARDENE) 125 mg in 0.9% sodium chloride 250 mL infusion   IntraVENous CONTINUOUS   ??? labetalol (NORMODYNE;TRANDATE) injection 5 mg  5 mg IntraVENous Q15MIN PRN   ??? sodium chloride (NS) flush 10 mL  10 mL InterCATHeter PRN   ??? sodium chloride (NS) flush 10-40 mL  10-40 mL InterCATHeter Q8H   ??? sodium chloride (NS) flush 10-30 mL  10-30 mL InterCATHeter PRN   ??? bacitracin 500 unit/gram packet 1 Packet  1 Packet Topical PRN   ??? PHENYLephrine 90,000 mcg in 0.9% sodium chloride 250 ml (NEOSYNEPHRINE) (premix or compounded) infusion  0-200 mcg/min IntraVENous TITRATE       Review of Systems:   12 points ROS attempted but unable to get as pt sedated, on vent.    Physical Exam:  Vitals  Temp (24hrs), Avg:99.9 ??F (37.7 ??C), Min:97.2 ??F (36.2 ??C), Max:101.5 ??F (38.6 ??C)    BP 109/60 mmHg   Pulse 69   Temp(Src) 99.5 ??F (37.5 ??C)   Resp 21   Ht 5\' 6"  (1.676 m)   Wt 107.5 kg (236 lb 15.9 oz)   BMI 38.27 kg/m2   SpO2 100%    General: Well-developed, 59 y.o. year-old, female, in no acute distress  HEENT: Normocephalic,?proptosis, Pupils equal, no oropharyngeal lesions. No sinus tenderness.EVD+  Neck:  Supple, no lymphadenopathy, masses or thyromegaly  Chest:  Symmetrical expansion  Lungs:  Clear to auscultation bilaterally, no dullness  Heart:  Regular rhythm, no murmurs, rubs or gallops, No JVD  Abdomen: Soft, non-tender,non distended, no organomegaly, BS+  Musculoskeletal: Unable to examine, moving LE spontaneously. No edema. No  clubbing or cyanosis  CNS:               Sedated   SKIN:               No skin lesion or rash. Dry, warm, intact          Labs: Results:   Chemistry Recent Labs      11/29/14   0315  11/28/14   2000  11/28/14   1145  11/28/14   0530  11/28/14   0030   GLU  99   --    --   207*  184*   NA  145   --    --   140  143   K  4.6  3.8   --   2.6*  2.9*   CL  113*   --    --   108  106   CO2  21   --    --   23  27   BUN  11   --    --   15  21*   CREA  0.99   --    --   0.89  1.18   CA  7.0*   --    --   7.2*  7.8*   AGAP  11   --    --   9  10   BUCR  11*   --    --   17  18   AP   --    --   122*  120*   --    TP   --    --   7.1  6.9   --    ALB   --    --   2.8*  2.9*   --    GLOB   --    --   4.3*  4.0   --  AGRAT   --    --   0.7*  0.7*   --       CBC w/Diff Recent Labs      11/29/14   0315  11/28/14   1640  11/28/14   0530  11/28/14   0030   WBC  12.7   --   13.9*  11.5   RBC  3.92*   --   3.94*  4.12*   HGB  10.9*  11.2*  11.0*  11.5*   HCT  33.6*  34.1*  34.3*  35.4   PLT  160   --   193  222   GRANS   --    --   86*  63   LYMPH   --    --   10*  26   EOS   --    --   0  1      Microbiology Recent Labs      11/29/14   0315  11/28/14   0520   CULT  NO GROWTH AFTER 4 HOURS   NO GROWTH AFTER 3 HOURS  NO GROWTH 1 DAY          Imaging-      Results from Hospital Encounter encounter on 11/28/14   XR CHEST PORT   Narrative Chest, single view    Indication: Respiratory failure, intubation, follow-up examination.    Comparison: Several prior exams, most recently Nov 28, 2014.    Findings:  Portable upright AP view of the chest was obtained. There is an  endotracheal tube present approximately 4 cm above the carina. A right  peripherally inserted central venous catheter projects over the superior  cavoatrial junction. A nasoenteric feeding tube is looped within the patient's  large hiatal hernia, with the weighted tip present just below the  gastroesophageal junction.     Mild perihilar opacities are present, improved from the prior series of  examinations. No pneumothorax or large pleural effusion. Cardiac size and  mediastinal contours are stable. No acute osseous abnormality.         Impression Impression:    1. Endotracheal tube and right peripherally inserted central catheter in  position as above.  2. Nasoenteric feeding tube looped within a large hiatal hernia, weighted tip  near the GE junction. This finding discussed with Diannia Ruder, neuro ICU nurse caring  for the patient by Dr. Karl Luke at 8:51 AM on 11/29/2014.  3. Mild persistent but improved perihilar opacities, which may reflect improved  atelectasis or pulmonary edema.            Results from Hospital Encounter encounter on 11/28/14   CT HEAD WO CONT   Narrative CT Head    History: Subarachnoid hemorrhage follow-up, status post EVD placement    Comparison: CT head 11/28/2014    Technique: Multiple axial CT images of the head were obtained extending from the  skull base to the vertex. Additional coronal and sagittal reformations  were  performed. Dose reduction techniques used: Automated exposure control,  adjustment of the mAs and/or kVp according to patient's size, standardized low  dose protocol, and/or iterative reconstruction techniques.    Findings:     Motion artifact is present which limits evaluation.    During the interval, there is placement of a ventricular shunt catheter which  enters from a right frontal approach. Tip of this catheter minimally crosses  midline with its tip abutting the septum. New right frontal scalp mildly  increased attenuation, maximally 9 mm in thickness is present at the convexity,  adjacent to the catheter and extending posteriorly. The ventricles remain  prominent in size particularly the frontal horns and temporal horns in relation  to the cortical sulci which are mildly effaced diffusely.    There is new localized hyperdensity seen in the lateral aspect of the right   frontal lobe, posterior to the site of the ventricular catheter. This hemorrhage  predominantly has an elongated horizontal configuration on sagittal and coronal  images, likely representing a distended, blood filled sulcus. The adjacent  parenchyma is mildly hypodense suggesting localized edema.    There continues to be diffuse scattered hyperdense subarachnoid hemorrhage.  Intraventricular hemorrhage is again identified, slightly increased in amount  layering posteriorly in the lateral ventricles. The overall diffuse subarachnoid  hemorrhage is relatively unchanged, perhaps slightly decreased within the  perimesencephalic cisterns.    No definite CT evidence of acute cortical infarct is seen. Orbital proptosis is  present based on imaging criteria. No retro-orbital lesion is seen. Nasogastric  and endotracheal tubes are present. Paranasal sinuses and mastoid air cells are  clear.                   Impression IMPRESSION:    Motion degraded study.    1.  New right frontal external ventricular drainage catheter. Tip is located  just to the left of midline abutting the septum. No significant change in mild  ventricular dilatation, out of proportion to cerebral sulci which are effaced.  Right frontal scalp hemorrhage is seen adjacent to and posterior to the  catheter.  2. Continued diffuse subarachnoid hemorrhage is present. Intraventricular  hemorrhage is again seen, mildly increased in amount of layering in the lateral  ventricles posteriorly. Mild diffuse cerebral edema again suggested.  3. New additional localized hemorrhage in the right frontal region, likely  representing blood filled sulcus with adjacent parenchymal edema. This  hemorrhage is located posterior to the site of drainage catheter; no definite  vascular abnormality seen in this region on review of earlier CTA head.  4. No definite CT evidence of acute cortical infarct is seen.  Please note that   noncontrast head CT may be normal in early acute infarct.   5. Continued CT follow-up is recommended.    These critical results of new right frontal hemorrhage were directly  communicated to Marigene Ehlers, NP at the time of dictation.  Read back was  performed.                ---------------------------------------------------------------------------------------------------------------  I have independently examined the patient and reviewed all lab studies and imgaing as well as review of nursing notes and physican notes from the past 24 hours. The plan of care has been discussed with the patient/relative and all questions are answered.       Sherran Needs, MD  11/29/2014    [facility]  [phone]

## 2014-11-29 NOTE — Progress Notes (Addendum)
8:22 PM  radiology called that dht needs to be advanced, tip is pointing upward, hiatal hernia present    8:54 PM  dht replaced, order placed for stat x-ray. patient moving all 4 extremities during tube placement    2120  DHT placement confirmed via radiologist. will initiate tube feeding once patient able to sit up    0200  tube feeding initiated at 10 mL/hr    0530  went in to reposition patient when explaining to the patient what I was going to do she nodded her head in agreement, nodded appropriately when questioned, squeezed my hand when prompted and wiggled toes when prompted bilaterally    Bedside and Verbal shift change report given to UkraineKara, Charity fundraiserN and Cygnetecilia, Charity fundraiserN (Cabin crewoncoming nurse) by Leanne LovelyJudonne D Baxter, RN   (offgoing nurse). Report included the following information SBAR, Intake/Output, MAR and Recent Results.

## 2014-11-29 NOTE — Other (Signed)
NUTRITIONAL ASSESSMENT AND PLAN OF CARE     Shela LeffDorothy R Clingerman           59 y.o.           11/28/2014                 1. SAH (subarachnoid hemorrhage) (HCC)           No Cultural, religious or ethnic dietary need identified.     Cultural, religious and ethnic food preferences identified and addressed      Participated in discharge planning/Interdisciplinary rounds   Food allergies:   No          Yes-  ASSESSMENT:   Pt is 182% ideal wt; BMI (calculated): 38.3 kg/m2 (obese classification).  Pt appears well nourished from available data.    A1C in prediabetes range.    INTERVENTIONS/PLAN:   Will monitor feeding status, labs and weights.       SUBJECTIVE/OBJECTIVE:   Information obtained from: chart review - pt off unit at this time.  Diet: NPO   No data found.    Medications:                 Reviewed     Most Recent POC Glucose:   Recent Labs      11/29/14   0315  11/28/14   0530  11/28/14   0030   GLU  99  207*  184*         Labs:   Lab Results   Component Value Date/Time    HEMOGLOBIN A1C 6.4 11/28/2014 11:45 AM     Lab Results   Component Value Date/Time    SODIUM 145 11/29/2014 03:15 AM    POTASSIUM 4.6 11/29/2014 03:15 AM    CHLORIDE 113 11/29/2014 03:15 AM    CO2 21 11/29/2014 03:15 AM    ANION GAP 11 11/29/2014 03:15 AM    GLUCOSE 99 11/29/2014 03:15 AM    BUN 11 11/29/2014 03:15 AM    CREATININE 0.99 11/29/2014 03:15 AM    CALCIUM 7.0 11/29/2014 03:15 AM    MAGNESIUM 3.0 11/29/2014 03:15 AM    PHOSPHORUS 1.4 11/29/2014 03:15 AM    ALBUMIN 2.8 11/28/2014 11:45 AM       Anthropometrics: IBW : 58.968 kg (130 lb), % IBW (Calculated): 182.3 %, BMI (calculated): 38.3  Wt Readings from Last 1 Encounters:   11/29/14 107.5 kg (236 lb 15.9 oz)      Ht Readings from Last 1 Encounters:   11/29/14 5\' 6"  (1.676 m)       Estimated Nutrition Needs: 2198 Kcals/day, Protein (g): 71 g Fluid (ml): 1800 ml  Based on:             Actual BW              IBW               Adjusted BW        Nutrition Diagnoses:    Obesity r/t excessive energy inake AEB BMI of 38.3 kg/m2.         Nutrition Interventions:  None at this time - NPO for procedure  Goal:  Po intake meets >75% estimated energy and protein needs when diet advanced by 12/04/14.  Weight maintenance (+/- 1-2 kg) by 12/09/14.        Nutrition Monitoring and Evaluation           Monitor po intake on meal rounds  Continue inpatient monitoring and intervention       Other:      Nutrition Risk:     High       Moderate      Minimal/Uncompromised    Rhea Pink, RD   LR pager [phone]

## 2014-11-29 NOTE — Progress Notes (Signed)
OT orders received and chart reviewed. Spoke with Marigene Ehlersrystal Baker, NP, who stated to hold OT evaluation for today. OT to follow up with patient tomorrow.    Lora PaulaHeather Wolf, MS OTR/L

## 2014-11-30 ENCOUNTER — Inpatient Hospital Stay
Admit: 2014-12-01 | Payer: BLUE CROSS/BLUE SHIELD | Primary: Student in an Organized Health Care Education/Training Program

## 2014-11-30 ENCOUNTER — Inpatient Hospital Stay
Admit: 2014-11-30 | Payer: BLUE CROSS/BLUE SHIELD | Primary: Student in an Organized Health Care Education/Training Program

## 2014-11-30 LAB — BLOOD GAS, ARTERIAL POC
Base deficit (POC): 7 mmol/L
Base deficit (POC): 7 mmol/L
Base deficit (POC): 5 mmol/L
FIO2 (POC): 0.55 %
FIO2 (POC): 0.8 %
FIO2 (POC): 80 %
HCO3 (POC): 17.7 MMOL/L — ABNORMAL LOW (ref 22–26)
HCO3 (POC): 18.9 MMOL/L — ABNORMAL LOW (ref 22–26)
HCO3 (POC): 19.8 MMOL/L — ABNORMAL LOW (ref 22–26)
Inspiratory Time: 0.8 s
Inspiratory Time: 0.8 s
Inspiratory Time: 0.8 s
PEEP/CPAP (POC): 11 cmH2O
PEEP/CPAP (POC): 11 cmH2O
PEEP/CPAP (POC): 11 cmH2O
Patient temp.: 95.9
Patient temp.: 97
Pressure support: 10 cmH2O
Pressure support: 10 cmH2O
Pressure support: 10 cmH2O
Set Rate: 10 {beats}/min
Set Rate: 10 {beats}/min
Set Rate: 10 {beats}/min
Tidal volume: 500 ml
Tidal volume: 500 ml
Tidal volume: 500 ml
Total resp. rate: 19
Total resp. rate: 17
Total resp. rate: 17
pCO2 (POC): 28.2 MMHG — ABNORMAL LOW (ref 35.0–45.0)
pCO2 (POC): 34.4 MMHG — ABNORMAL LOW (ref 35.0–45.0)
pCO2 (POC): 31.1 MMHG — ABNORMAL LOW (ref 35.0–45.0)
pH (POC): 7.399 (ref 7.35–7.45)
pH (POC): 7.347 — ABNORMAL LOW (ref 7.35–7.45)
pH (POC): 7.408 (ref 7.35–7.45)
pO2 (POC): 52 MMHG — ABNORMAL LOW (ref 80–100)
pO2 (POC): 95 MMHG (ref 80–100)
pO2 (POC): 129 MMHG — ABNORMAL HIGH (ref 80–100)
sO2 (POC): 89 % — ABNORMAL LOW (ref 92–97)
sO2 (POC): 97 % (ref 92–97)
sO2 (POC): 99 % — ABNORMAL HIGH (ref 92–97)

## 2014-11-30 LAB — CBC W/O DIFF
HCT: 25.9 % — ABNORMAL LOW (ref 35.0–45.0)
HGB: 8.3 g/dL — ABNORMAL LOW (ref 12.0–16.0)
MCH: 27.7 PG (ref 24.0–34.0)
MCHC: 32 g/dL (ref 31.0–37.0)
MCV: 86.3 FL (ref 74.0–97.0)
MPV: 10.7 FL (ref 9.2–11.8)
PLATELET: 124 10*3/uL — ABNORMAL LOW (ref 135–420)
RBC: 3 M/uL — ABNORMAL LOW (ref 4.20–5.30)
RDW: 15.6 % — ABNORMAL HIGH (ref 11.6–14.5)
WBC: 8 10*3/uL (ref 4.6–13.2)

## 2014-11-30 LAB — METABOLIC PANEL, BASIC
Anion gap: 9 mmol/L (ref 3.0–18)
BUN/Creatinine ratio: 16 (ref 12–20)
BUN: 10 MG/DL (ref 7.0–18)
CO2: 20 mmol/L — ABNORMAL LOW (ref 21–32)
Calcium: 6.2 MG/DL — ABNORMAL LOW (ref 8.5–10.1)
Chloride: 121 mmol/L — ABNORMAL HIGH (ref 100–108)
Creatinine: 0.61 MG/DL (ref 0.6–1.3)
GFR est AA: >60 mL/min/{1.73_m2} (ref >60)
GFR est non-AA: >60 mL/min/{1.73_m2} (ref >60)
Glucose: 122 mg/dL — ABNORMAL HIGH (ref 74–99)
Potassium: 3.7 mmol/L (ref 3.5–5.5)
Sodium: 150 mmol/L — ABNORMAL HIGH (ref 136–145)

## 2014-11-30 LAB — GLUCOSE, POC
Glucose (POC): 106 mg/dL (ref 70–110)
Glucose (POC): 151 mg/dL — ABNORMAL HIGH (ref 70–110)
Glucose (POC): 123 mg/dL — ABNORMAL HIGH (ref 70–110)

## 2014-11-30 LAB — PHOSPHORUS
Phosphorus: 2.1 MG/DL — ABNORMAL LOW (ref 2.5–4.9)
Phosphorus: 1.3 MG/DL — ABNORMAL LOW (ref 2.5–4.9)

## 2014-11-30 LAB — PTT: aPTT: 40 s — ABNORMAL HIGH (ref 24.6–37.7)

## 2014-11-30 LAB — VDRL, CSF: VDRL, CSF: NONREACTIVE

## 2014-11-30 LAB — MAGNESIUM
Magnesium: 2.4 mg/dL (ref 1.8–2.4)
Magnesium: 2.6 mg/dL — ABNORMAL HIGH (ref 1.8–2.4)

## 2014-11-30 LAB — CRP, HIGH SENSITIVITY: C-Reactive Protein, Cardiac: 41.07 mg/L — ABNORMAL HIGH (ref 0.00–3.00)

## 2014-11-30 LAB — HGB & HCT
HCT: 25.2 % — ABNORMAL LOW (ref 35.0–45.0)
HGB: 8.2 g/dL — ABNORMAL LOW (ref 12.0–16.0)

## 2014-11-30 LAB — POC ACTIVATED CLOTTING TIME
Activated Clotting Time (POC): 159 SECS — ABNORMAL HIGH (ref 79–138)
Activated Clotting Time (POC): 171 SECS — ABNORMAL HIGH (ref 79–138)
Activated Clotting Time (POC): 177 SECS — ABNORMAL HIGH (ref 79–138)

## 2014-11-30 LAB — POTASSIUM: Potassium: 3.3 mmol/L — ABNORMAL LOW (ref 3.5–5.5)

## 2014-11-30 LAB — HOMOCYSTEINE, PLASMA: Homocysteine, plasma: 8.4 umol/L (ref 0.0–15.0)

## 2014-11-30 LAB — CULTURE, URINE
Culture result:: NO GROWTH
Culture: NO GROWTH

## 2014-11-30 LAB — CARDIAC PANEL,(CK, CKMB & TROPONIN)
CK - MB: 5.7 ng/ml — ABNORMAL HIGH (ref 0.5–3.6)
CK-MB Index: 0.6 % (ref 0.0–4.0)
CK: 930 U/L — ABNORMAL HIGH (ref 26–192)
Troponin-I, QT: 0.17 NG/ML — ABNORMAL HIGH (ref 0.0–0.045)

## 2014-11-30 LAB — PROTHROMBIN TIME + INR
INR: 1.4 — ABNORMAL HIGH (ref 0.8–1.2)
Prothrombin time: 16.3 s — ABNORMAL HIGH (ref 11.5–15.2)

## 2014-11-30 LAB — CALCIUM, IONIZED: Ionized Calcium: 0.99 MMOL/L — ABNORMAL LOW (ref 1.12–1.32)

## 2014-11-30 MED ORDER — POTASSIUM CHLORIDE 20 MEQ/50 ML IV PIGGY BACK
20 mEq/50 mL | Freq: Once | INTRAVENOUS | Status: AC
Start: 2014-11-30 — End: 2014-11-30
  Administered 2014-11-30: 12:00:00 via INTRAVENOUS

## 2014-11-30 MED ORDER — POTASSIUM CHLORIDE 10 MEQ/50 ML IV PIGGY BACK
10 mEq/50 mL | Freq: Once | INTRAVENOUS | Status: DC
Start: 2014-11-30 — End: 2014-11-30
  Administered 2014-11-30: 20:00:00 via INTRAVENOUS

## 2014-11-30 MED ORDER — IPRATROPIUM-ALBUTEROL 2.5 MG-0.5 MG/3 ML NEB SOLUTION
2.5 mg-0.5 mg/3 ml | RESPIRATORY_TRACT | Status: DC
Start: 2014-11-30 — End: 2014-12-04
  Administered 2014-11-30 – 2014-12-04 (×22): via RESPIRATORY_TRACT

## 2014-11-30 MED ORDER — POTASSIUM CHLORIDE 20 MEQ/50 ML IV PIGGY BACK
20 mEq/50 mL | INTRAVENOUS | Status: AC
Start: 2014-11-30 — End: 2014-12-01
  Administered 2014-11-30 – 2014-12-01 (×2): via INTRAVENOUS

## 2014-11-30 MED ORDER — ACETAMINOPHEN 325 MG TABLET
325 mg | ORAL | Status: DC | PRN
Start: 2014-11-30 — End: 2014-11-30

## 2014-11-30 MED ORDER — SODIUM CHLORIDE 0.9 % IV PIGGY BACK
INTRAVENOUS | Status: AC
Start: 2014-11-30 — End: 2014-11-30
  Administered 2014-11-30: 16:00:00

## 2014-11-30 MED ORDER — SODIUM CHLORIDE 0.9 % IV PIGGY BACK
INTRAVENOUS | Status: AC
Start: 2014-11-30 — End: 2014-11-30

## 2014-11-30 MED ORDER — SODIUM CHLORIDE 0.9 % IV
INTRAVENOUS | Status: DC | PRN
Start: 2014-11-30 — End: 2014-12-05

## 2014-11-30 MED ORDER — FUROSEMIDE 10 MG/ML IJ SOLN
10 mg/mL | Freq: Once | INTRAMUSCULAR | Status: AC
Start: 2014-11-30 — End: 2014-11-30
  Administered 2014-11-30: 14:00:00 via INTRAVENOUS

## 2014-11-30 MED ORDER — ACETAMINOPHEN 325 MG TABLET
325 mg | ORAL | Status: DC | PRN
Start: 2014-11-30 — End: 2014-12-12
  Administered 2014-11-30 – 2014-12-11 (×25): via ORAL

## 2014-11-30 MED ORDER — POTASSIUM CHLORIDE 10 MEQ/50 ML IV PIGGY BACK
10 mEq/50 mL | Freq: Once | INTRAVENOUS | Status: AC
Start: 2014-11-30 — End: 2014-11-30
  Administered 2014-11-30: 22:00:00 via INTRAVENOUS

## 2014-11-30 MED ORDER — SODIUM PHOSPHATE 3 MMOLE/ML IV
3 mmol/mL | Freq: Once | INTRAVENOUS | Status: AC
Start: 2014-11-30 — End: 2014-11-30
  Administered 2014-11-30: 22:00:00 via INTRAVENOUS

## 2014-11-30 MED ORDER — POTASSIUM CHLORIDE 20 MEQ/50 ML IV PIGGY BACK
20 mEq/50 mL | INTRAVENOUS | Status: DC
Start: 2014-11-30 — End: 2014-11-30
  Administered 2014-11-30: 20:00:00 via INTRAVENOUS

## 2014-11-30 MED ORDER — PHYTONADIONE 5 MG TAB
5 mg | ORAL | Status: AC
Start: 2014-11-30 — End: 2014-11-30
  Administered 2014-11-30: 16:00:00 via ORAL

## 2014-11-30 MED ORDER — IPRATROPIUM-ALBUTEROL 2.5 MG-0.5 MG/3 ML NEB SOLUTION
2.5 mg-0.5 mg/3 ml | Freq: Four times a day (QID) | RESPIRATORY_TRACT | Status: DC
Start: 2014-11-30 — End: 2014-11-30
  Administered 2014-11-30 (×2): via RESPIRATORY_TRACT

## 2014-11-30 MED FILL — DIPRIVAN 10 MG/ML INTRAVENOUS EMULSION: 10 mg/mL | INTRAVENOUS | Qty: 140

## 2014-11-30 MED FILL — SODIUM CHLORIDE 0.9 % IV PIGGY BACK: INTRAVENOUS | Qty: 50

## 2014-11-30 MED FILL — NIMODIPINE 30 MG CAP: 30 mg | ORAL | Qty: 1

## 2014-11-30 MED FILL — SODIUM CHLORIDE 0.9 % INJECTION: INTRAMUSCULAR | Qty: 10

## 2014-11-30 MED FILL — CHLORHEXIDINE GLUCONATE 0.12 % MOUTHWASH: 0.12 % | Qty: 15

## 2014-11-30 MED FILL — POTASSIUM CHLORIDE 20 MEQ/50 ML IV PIGGY BACK: 20 mEq/50 mL | INTRAVENOUS | Qty: 50

## 2014-11-30 MED FILL — IPRATROPIUM-ALBUTEROL 2.5 MG-0.5 MG/3 ML NEB SOLUTION: 2.5 mg-0.5 mg/3 ml | RESPIRATORY_TRACT | Qty: 3

## 2014-11-30 MED FILL — SODIUM CHLORIDE 0.9 % IV PIGGY BACK: INTRAVENOUS | Qty: 100

## 2014-11-30 MED FILL — FOLIC ACID 5 MG/ML IJ SOLN: 5 mg/mL | INTRAMUSCULAR | Qty: 0.2

## 2014-11-30 MED FILL — TYLENOL 325 MG TABLET: 325 mg | ORAL | Qty: 2

## 2014-11-30 MED FILL — LIDOCAINE (PF) 20 MG/ML (2 %) IJ SOLN: 20 mg/mL (2 %) | INTRAMUSCULAR | Qty: 100

## 2014-11-30 MED FILL — SODIUM PHOSPHATE 3 MMOLE/ML IV: 3 mmol/mL | INTRAVENOUS | Qty: 5

## 2014-11-30 MED FILL — MAGNESIUM SULFATE 2 GRAM/50 ML IVPB: 2 gram/50 mL (4 %) | INTRAVENOUS | Qty: 50

## 2014-11-30 MED FILL — POTASSIUM CHLORIDE 10 MEQ/50 ML IV PIGGY BACK: 10 mEq/50 mL | INTRAVENOUS | Qty: 50

## 2014-11-30 MED FILL — PRECEDEX 100 MCG/ML INTRAVENOUS SOLUTION: 100 mcg/mL | INTRAVENOUS | Qty: 4

## 2014-11-30 MED FILL — SENNA PLUS 8.6 MG-50 MG TABLET: ORAL | Qty: 2

## 2014-11-30 MED FILL — FUROSEMIDE 10 MG/ML IJ SOLN: 10 mg/mL | INTRAMUSCULAR | Qty: 2

## 2014-11-30 MED FILL — SODIUM CHLORIDE 0.9 % IV: INTRAVENOUS | Qty: 250

## 2014-11-30 MED FILL — ONDANSETRON (PF) 4 MG/2 ML INJECTION: 4 mg/2 mL | INTRAMUSCULAR | Qty: 4

## 2014-11-30 MED FILL — ASPIRIN 81 MG CHEWABLE TAB: 81 mg | ORAL | Qty: 1

## 2014-11-30 MED FILL — LACTATED RINGERS IV: INTRAVENOUS | Qty: 500

## 2014-11-30 MED FILL — MEPHYTON 5 MG TABLET: 5 mg | ORAL | Qty: 1

## 2014-11-30 MED FILL — VANCOMYCIN 1,000 MG IV SOLR: 1000 mg | INTRAVENOUS | Qty: 1000

## 2014-11-30 NOTE — Other (Addendum)
0700: Bedside shift change report given to Dorie Rankecilia E Bardy-Gagner, RN with Diannia RuderKara, RN as preceptor (oncoming nurse) by Marijean BravoBaxter, J, RN (offgoing nurse). Report included the following information SBAR, Kardex, Intake/Output, MAR and Recent Results.     0800: Sedation paused. Pt able to nod appropriately. Based on nodding, pt seems aware that she is in the hospital and knows her name. Able to follow commands with all four limbs. Reoriented pt to situation. Pt is calm and nodded after explanation.    Soon after Nimodepine is given, SBP drops to low 80's. This seems to be consistent with every dose. [facility] about 10 minutes of dropping. NEO increased briefly during these periods.    1000: Children at bedside, pt able to write with a pen and paper. Not very legible, but helps with communication.    1100: BP continues to drop, even as low as SBP in the 70s when Nimodepine is given. Continue to adjust Neo frequently to acomodate.     1400: Temperature creeping up, applied ice pack to back of neck, temp remains < 100.5    1800: Pt complained of pain in her head. Indicated that it was not a "bad" pain but a "medium" pain by nodding when asked. Marigene Ehlersrystal Baker, NP notified. Verbal order for tylenol for headache.

## 2014-11-30 NOTE — Progress Notes (Addendum)
2000> Patient assessed. Patient is lying in bed, resting. Patient is alert, opens eyes spontaneously, follows commands, nods approprietly on questions. EVD is 15 above and open, draining bright-red CSF. Restraints in place. Right and left groin area WNL.  Patient does not show any signs of pain or discomfort. No further changes or complaints. Call bell within reach. Bed is locked in low position. Side rails up x 3. Will continue to monitor. Required documentation is not completed because patient is intubated and no family at bedside.   2035> NP Baker called asked to put order to recheck H+ H  4 hours after infusion. Notified NP Baker of color of CSF and neuro exam. Received order for STAT CT. Order placed.  0000> No changes in patient's condition. Patient is sleeping. No signs of pain or distress. Will continue to monitor.  0400> No changes.  0700> Bedside and Verbal shift change report given to Linford ArnoldJerilynn, Charity fundraiserN (oncoming nurse) by Donald Sivalena Yermak, RN (offgoing nurse). Report included the following information SBAR, Kardex, Intake/Output, MAR and Recent Results.

## 2014-11-30 NOTE — Progress Notes (Addendum)
Neurovascular Progress Note      Patient: Renee West MRN: [id number]  SSN: [id number]    Date of Birth: Jul 26, 1955  Age: 59 y.o.  Sex: female      Events  Patient underwent embolization of LVA pseudoaneurysm on yesterday. No neurophysiologic monitoring changes during test occlusion.?? LVA dissecting 3.17mm fusiform pseudoaneurysm with antegrade stasis after embolization. LVA fills retrograde from VBJ into LPICA during RVA injection but no pseudoaneurysm filling.    EVD decreased from 25 cm to 15 cm.    She remains ventilated. Sedative weaned off.     Vital Signs  Patient Vitals for the past 24 hrs:   Temp Pulse Resp BP SpO2   11/30/14 1222 - 70 22 - 97 %   11/30/14 1200 99.1 ??F (37.3 ??C) 72 19 99/51 mmHg 99 %   11/30/14 1100 99 ??F (37.2 ??C) 79 18 90/52 mmHg 100 %   11/30/14 1000 98.6 ??F (37 ??C) 75 17 108/62 mmHg 100 %   11/30/14 0943 - - - - 100 %   11/30/14 0942 - - - - 99 %   11/30/14 0900 98.6 ??F (37 ??C) 70 16 102/62 mmHg 100 %   11/30/14 0853 - 69 16 - 100 %   11/30/14 0800 98.4 ??F (36.9 ??C) 67 20 123/70 mmHg 100 %   11/30/14 0700 - 68 - 117/68 mmHg 100 %   11/30/14 0600 - 68 - 104/61 mmHg 100 %   11/30/14 0500 - 67 - 110/66 mmHg 100 %   11/30/14 0400 97.3 ??F (36.3 ??C) 67 - 100/60 mmHg 100 %   11/30/14 0324 - 64 19 - 100 %   11/30/14 0300 - 62 - 109/70 mmHg 100 %   11/30/14 0200 - 65 - 108/65 mmHg 100 %   11/30/14 0100 - 63 - 109/68 mmHg 100 %   11/30/14 0001 - 63 - 97/63 mmHg 98 %   11/30/14 0000 96.6 ??F (35.9 ??C) 62 - - 98 %   11/29/14 2358 - 62 19 - 98 %   11/29/14 2300 96.3 ??F (35.7 ??C) 63 - 105/65 mmHg 100 %   11/29/14 2200 - 65 - 91/59 mmHg 95 %   11/29/14 2130 - 65 - 122/72 mmHg 100 %   11/29/14 2100 - 63 - 120/70 mmHg 100 %   11/29/14 2030 - 62 16 123/75 mmHg 100 %   11/29/14 2015 - 63 - - 100 %   11/29/14 2000 95.9 ??F (35.5 ??C) 64 - 122/71 mmHg 100 %   11/29/14 1945 - 64 - - 100 %   11/29/14 1930 - 65 - 115/69 mmHg 100 %   11/29/14 1915 - 66 - - 100 %    11/29/14 1900 95.9 ??F (35.5 ??C) 71 - (!) 87/56 mmHg 100 %   11/29/14 1845 - 65 - 106/65 mmHg 99 %   11/29/14 1830 - 64 - 116/66 mmHg 98 %   11/29/14 1815 95.9 ??F (35.5 ??C) 63 18 116/66 mmHg -   11/29/14 1805 (!) 35.5 ??F (1.9 ??C) - - - -   11/29/14 1800 95.9 ??F (35.5 ??C) 63 18 115/68 mmHg -       NIHSS Flow Sheet  Total: 12 (11/30/14 1200)     ICP:  9-12    Vent  Vent Settings  FIO2 (%): 60 %  CMV Rate Set: 13  SIMV Rate Set: 10  Back-Up Rate: 10  Vt Set (ml): 500 ml  Pressure Support (cm H2O):  7 cm H2O  PEEP/VENT (cm H2O): 8 cm H20  Insp Time (sec): 0.8 sec  Insp Rise Time %: 60 %  Pressure Trigger: 2  Flow Trigger: 3  Expiratory Sensitivity: 15 %      Intake and Output    Intake/Output Summary (Last 24 hours) at 11/30/14 1326  Last data filed at 11/30/14 1300   Gross per 24 hour   Intake 2374.34 ml   Output   3241 ml   Net -866.66 ml     CSF drainage:  156 ml over 24 hours    Data    Recent Results (from the past 24 hour(s))   POC ACTIVATED CLOTTING TIME    Collection Time: 11/29/14  1:47 PM   Result Value Ref Range    Activated Clotting Time (POC) 147 (H) 79 - 138 SECS   POC ACTIVATED CLOTTING TIME    Collection Time: 11/29/14  3:12 PM   Result Value Ref Range    Activated Clotting Time (POC) 214 (H) 79 - 138 SECS   POC ACTIVATED CLOTTING TIME    Collection Time: 11/29/14  3:45 PM   Result Value Ref Range    Activated Clotting Time (POC) 233 (H) 79 - 138 SECS   POC ACTIVATED CLOTTING TIME    Collection Time: 11/29/14  4:24 PM   Result Value Ref Range    Activated Clotting Time (POC) 239 (H) 79 - 138 SECS   POC G3    Collection Time: 11/29/14 10:20 PM   Result Value Ref Range    Device: VENT      FIO2 (POC) 0.55 %    pH (POC) 7.399 7.35 - 7.45      pCO2 (POC) 28.2 (L) 35.0 - 45.0 MMHG    pO2 (POC) 52 (L) 80 - 100 MMHG    HCO3 (POC) 17.7 (L) 22 - 26 MMOL/L    sO2 (POC) 89 (L) 92 - 97 %    Base deficit (POC) 7 mmol/L    Mode SIMV      Tidal volume 500 ml    Set Rate 10 bpm    PEEP/CPAP (POC) 11 cmH2O     Pressure support 10 cmH2O    Allens test (POC) N/A      Inspiratory Time 0.8 sec    Total resp. rate 19      Site DRAWN FROM ARTERIAL LINE      Patient temp. 95.9      Specimen type (POC) ARTERIAL      Performed by De Nurse     Volume control plus YES     POC G3    Collection Time: 11/29/14 11:03 PM   Result Value Ref Range    Device: VENT      FIO2 (POC) 0.80 %    pH (POC) 7.347 (L) 7.35 - 7.45      pCO2 (POC) 34.4 (L) 35.0 - 45.0 MMHG    pO2 (POC) 95 80 - 100 MMHG    HCO3 (POC) 18.9 (L) 22 - 26 MMOL/L    sO2 (POC) 97 92 - 97 %    Base deficit (POC) 7 mmol/L    Mode SIMV      Tidal volume 500 ml    Set Rate 10 bpm    PEEP/CPAP (POC) 11 cmH2O    Pressure support 10 cmH2O    Allens test (POC) N/A      Inspiratory Time 0.8 sec    Total resp. rate 17  Site DRAWN FROM ARTERIAL LINE      Specimen type (POC) ARTERIAL      Performed by De Nurse     Volume control plus YES     GLUCOSE, POC    Collection Time: 11/30/14 12:47 AM   Result Value Ref Range    Glucose (POC) 106 70 - 110 mg/dL   CBC W/O DIFF    Collection Time: 11/30/14  3:55 AM   Result Value Ref Range    WBC 8.0 4.6 - 13.2 K/uL    RBC 3.00 (L) 4.20 - 5.30 M/uL    HGB 8.3 (L) 12.0 - 16.0 g/dL    HCT 14.7 (L) 82.9 - 45.0 %    MCV 86.3 74.0 - 97.0 FL    MCH 27.7 24.0 - 34.0 PG    MCHC 32.0 31.0 - 37.0 g/dL    RDW 56.2 (H) 13.0 - 14.5 %    PLATELET 124 (L) 135 - 420 K/uL    MPV 10.7 9.2 - 11.8 FL   PROTHROMBIN TIME + INR    Collection Time: 11/30/14  3:55 AM   Result Value Ref Range    Prothrombin time 16.3 (H) 11.5 - 15.2 sec    INR 1.4 (H) 0.8 - 1.2     PTT    Collection Time: 11/30/14  3:55 AM   Result Value Ref Range    aPTT 40.0 (H) 24.6 - 37.7 SEC   METABOLIC PANEL, BASIC    Collection Time: 11/30/14  3:55 AM   Result Value Ref Range    Sodium 150 (H) 136 - 145 mmol/L    Potassium 3.7 3.5 - 5.5 mmol/L    Chloride 121 (H) 100 - 108 mmol/L    CO2 20 (L) 21 - 32 mmol/L    Anion gap 9 3.0 - 18 mmol/L    Glucose 122 (H) 74 - 99 mg/dL     BUN 10 7.0 - 18 MG/DL    Creatinine 8.65 0.6 - 1.3 MG/DL    BUN/Creatinine ratio 16 12 - 20      GFR est AA >60 >60 ml/min/1.54m2    GFR est non-AA >60 >60 ml/min/1.22m2    Calcium 6.2 (L) 8.5 - 10.1 MG/DL   CALCIUM, IONIZED    Collection Time: 11/30/14  3:55 AM   Result Value Ref Range    Ionized Calcium 0.99 (L) 1.12 - 1.32 MMOL/L   MAGNESIUM    Collection Time: 11/30/14  3:55 AM   Result Value Ref Range    Magnesium 2.4 1.8 - 2.4 mg/dL   PHOSPHORUS    Collection Time: 11/30/14  3:55 AM   Result Value Ref Range    Phosphorus 2.1 (L) 2.5 - 4.9 MG/DL   CARDIAC PANEL,(CK, CKMB & TROPONIN)    Collection Time: 11/30/14  3:55 AM   Result Value Ref Range    CK 930 (H) 26 - 192 U/L    CK - MB 5.7 (H) 0.5 - 3.6 ng/ml    CK-MB Index 0.6 0.0 - 4.0 %    Troponin-I, Qt. 0.17 (H) 0.0 - 0.045 NG/ML   POC G3    Collection Time: 11/30/14  4:02 AM   Result Value Ref Range    Device: VENT      FIO2 (POC) 80 %    pH (POC) 7.408 7.35 - 7.45      pCO2 (POC) 31.1 (L) 35.0 - 45.0 MMHG    pO2 (POC) 129 (H) 80 - 100 MMHG  HCO3 (POC) 19.8 (L) 22 - 26 MMOL/L    sO2 (POC) 99 (H) 92 - 97 %    Base deficit (POC) 5 mmol/L    Mode SIMV      Tidal volume 500 ml    Set Rate 10 bpm    PEEP/CPAP (POC) 11 cmH2O    Pressure support 10 cmH2O    Allens test (POC) N/A      Inspiratory Time 0.8 sec    Total resp. rate 17      Site DRAWN FROM ARTERIAL LINE      Patient temp. 97.0      Specimen type (POC) ARTERIAL      Performed by [place]     Volume control plus YES     POC ACTIVATED CLOTTING TIME    Collection Time: 11/30/14 11:09 AM   Result Value Ref Range    Activated Clotting Time (POC) 171 (H) 79 - 138 SECS   POC ACTIVATED CLOTTING TIME    Collection Time: 11/30/14 11:34 AM   Result Value Ref Range    Activated Clotting Time (POC) 159 (H) 79 - 138 SECS   POC ACTIVATED CLOTTING TIME    Collection Time: 11/30/14 11:57 AM   Result Value Ref Range    Activated Clotting Time (POC) 177 (H) 79 - 138 SECS   GLUCOSE, POC     Collection Time: 11/30/14 12:08 PM   Result Value Ref Range    Glucose (POC) 151 (H) 70 - 110 mg/dL        Physical Exam  General: Supine, HOB; 30 degrees.  Neuro: Patient is opens eyes to verbal stimulation. Bilateral proptosis. Pupils 2 and reactive. She does follow simple fingers by holding fingers up when asked to do so. She lifts all extremities off bed spontaneously.    Wound: Right frontal EVD in place with serosanguinous.   Pulmonary: Decrease breath sounds throughout.  CV: S1 and S2 heard.  Abd: Obese, NT, ND, NG. DHT in place.  Extremities: Right groin without hematoma. Distal pulses 1+.      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     1.) Neurologic: Neuro exam improved after draining some CSF when EVD was decrease to 15 cm in the setting of LVA fusiform pseudoaneurysm being secured. Aspirin therapy started to help prevent embolic formation along the coils. SBP goal for pressors 90-120 mmHg and 120-150 mmHg for antihypertensives.    AED discontinue. Nimodipine for 21 days; dose split every 2 hours. Minimize sedation to maintain safety on ventilator.    PT/OT at bed level.     2.) Cardiovascular: Patient remains on pressors to keep SBP within goal parameters. Significant fluctuations in blood pressure may be neurogenic in nature but also concern for SIRS/sepsis. TTE no RWMA with initially normal cardiac enzymes which have elevate slightly in pressors. Patient diuresed with furosemide. Control HR first before peripheral antihypertensives.     3.) Pulmonary: PCXR increase in edema.  LLL atelectasis versus aspiration pneumonitis also improved.  Adjust vent to maintain normoxia and eucapnia. Nebs standing and as needed.    4.) Endocrine: Insulin scale.     5.) Hematology: Hgb lower than ideal goal of 10 for cerebral ischemia and  myocardial ischemia. LE US no acute DVT. SCDs. No pharmacologic DVT prophylaxis or treatment for now.  Iron deficiency anemia. Replete iron  iv when hemodynamics more stable. Phytonadione given for increased INR.     6.) Fluids, Electrolytes, and Nutrition: Normotonic hydration; may need hypertonic if cerebral edema is problematic. Vitamins given.     7.) Gastrointestinal: Antiemtics and PPI. Hemoccult stools as iron deficiency anemia while on warfarin.    8.) LTD: Foley in place. A-line in place. PICC placed.  DHT.    9.) ID: Fever of unclear etiology. PCXR with increase in edema possible LLL aspiration pneumonitis.  Mini BAL revealed rare gram - rods and rare candida albicans. BCx and CSF studies no growth thus far. Could be central but remain vigilant for infection.  Broad spectrum antibiotics to penetrate CNS with EVD in place until culture data can guide adjustment.  Antipyretics and external cooling systems if needed to maintain euthermia. Repeat studies     40 minutes of neurocritical care time was provided independent of procedures.      Bing Plume, NP

## 2014-11-30 NOTE — Progress Notes (Signed)
Patient completed ROM as follows.  RLE X 5 (dorsi/plantarflexion,  knee flex/extension, hip ab/adduction, hip flex/extension)   LLE X 5 (dorsi/plantarflexion,  knee flex/extension, hip ab/adduction, hip flex/extension)      Pt participation was:  (X) AROM/ AAROM      Pain Level Before FMP: Unable to verbalize  Pain Level After FMP: Unable to verbalize   Non Verbal Pain Scale : None    (X) Alerted Nursing.  (X) Call bell within patient reach.  (X) Pt resting in no apparent distress in bed.    NOTE:   Pt presented supine with HOB at 30 degrees, EVD placed, and on vent. Despite being intubated, pt was able to verify name and DOB by nodding head "yes or no" appropriately.   Pt was agreeable to bedlevel exercises and demonstrated bilateral AROM. Prevlons, SCDs and wrist restraints were placed after FMP. HOB did not change and EVD remained stationary.   OT educated the appropriate exercises for FMP during evaluation.      Curnel Cassie FreerHall Jr, Rehab [employer]

## 2014-11-30 NOTE — Progress Notes (Signed)
2115 Transported patient to CT and back with transport vent. No problems transporting patient. Patient back on ventilator. HR 86 and SpO2 100%. Will continue to monitor patient closely.

## 2014-11-30 NOTE — Progress Notes (Signed)
Problem: Self Care Deficits Care Plan (Adult)  Goal: *Acute Goals and Plan of Care (Insert Text)  Occupational Therapy Goals  Initiated 11/30/2014 within 7 day(s).    1. Patient will perform AAROM to BUEs for maintenance of ROM and strength.  OCCUPATIONAL THERAPY EVALUATION    Patient: Renee West (59 y.o. female)  Date: 11/30/2014  Primary Diagnosis: SAH (subarachnoid hemorrhage) (HCC)        Precautions:  Fall (bedrest)      ASSESSMENT :  Based on the objective data described below, the patient presents with decreased functional strength, impaired coordination, decreased overall activity tolerance limiting independence with ADLs.  Pt currently with EVD, therefore pt seen at bed level. Per Marigene Ehlers, NP, ok to see patient at bed level. Pt demonstrates with  UE AROM and strength WFL in bed. At this time, pt would benefit from functional maintenance program to maintain/increase UE ROM and strength for ADL tasks. Rehab tech educated on and verbalized appropriate UE exercises for patient.    Education: Role of OT in acute care and plan of care; importance of continued exercises    Patient will benefit from skilled intervention to address the above impairments.  Patient???s rehabilitation potential is considered to be Good  Factors which may influence rehabilitation potential include:                None noted               Mental ability/status               Medical condition               Home/family situation and support systems               Safety awareness               Pain tolerance/management               Other:        PLAN :  Recommendations and Planned Interventions:                 Self Care Training                          Therapeutic Activities                 Functional Mobility Training            Cognitive Retraining                 Therapeutic Exercises                   Endurance Activities                 Balance Training                            Neuromuscular Re-Education                 Visual/Perceptual Training        Home Safety Training                 Patient Education                   Family Training/Education  [ ]                Other (comment):    Frequency/Duration: Patient will be followed by rehab tech for FMP 3-5 times a week to address goals, weekly by OT  Discharge Recommendations: To Be Determined when further OOB transfers and ADLs are assessed  Further Equipment Recommendations for Discharge: To Be Determined when further OOB transfers and ADLs are assessed       SUBJECTIVE:   Patient stated ???.???      OBJECTIVE DATA SUMMARY:       Past Medical History   Diagnosis Date   ??? Hypothyroid     ??? DVT (deep venous thrombosis) (HCC)     ??? Warfarin anticoagulation     ??? 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 Hydrocephalus 11/28/2014   ??? EVD right frontal 11/28/2014     Past Surgical History   Procedure Laterality Date   ??? Hx hysterectomy       ??? Hx thyroidectomy         Barriers to Learning/Limitations: None  Compensate with: visual, verbal, tactile, kinesthetic cues/model  GCODES (GO)n/a  Prior Level of Function/Home Situation: I with ADLs   Home Situation  Home Environment: Private residence  One/Two Story Residence: Two story  Living Alone: Yes  Support Systems: Family member(s)  Current DME Used/Available at Home: None    Cognitive/Behavioral Status:  Neurologic State: Eyes open spontaneously;Alert  Orientation Level: Oriented to person;Oriented to place  Cognition: Follows commands  Safety/Judgement: Fall prevention     Coordination:  Coordination: Generally decreased, functional  Fine Motor Skills-Upper: Left Intact;Right Intact    Gross Motor Skills-Upper: Left Intact;Right Intact  Strength:  Strength: Generally decreased, functional     Range of Motion:  AROM: Within functional limits  PROM: Within functional limits     Functional Mobility and Transfers for ADLs:  Bed Mobility:    Not assessed due to EVD     Transfers:                Not assessed due to EVD    ADL Intervention:  Cognitive Retraining  Safety/Judgement: Fall prevention    Therapeutic Exercise:  AROM x5 reps (shoulder flexion, elbow flexion/extension, wrist flexion, extension)    Pain:  Pre-treatment: no pain reported  Post-treatment: no pain reported  Activity Tolerance:   fair  Please refer to the flowsheet for vital signs taken during this treatment.  After treatment:   [ ]  Patient left in no apparent distress sitting up in chair  [X]  Patient left in no apparent distress in bed  [X]  Call bell left within reach  [X]  Nursing notified  [ ]  Caregiver present  [ ]  Bed alarm activated      COMMUNICATION/EDUCATION:   [ ]  Home safety education was provided and the patient/caregiver indicated understanding.  [X]  Patient/family have participated as able in goal setting and plan of care.  [X]  Patient/family agree to work toward stated goals and plan of care.  [ ]  Patient understands intent and goals of therapy, but is neutral about his/her participation.  [ ]  Patient is unable to participate in goal setting and plan of care.    Thank you for this referral.  Lora PaulaHeather Wolf, MS OTR/L  Time Calculation: 18 mins

## 2014-11-30 NOTE — Progress Notes (Addendum)
Problem: Ventilator Management  Goal: *Adequate oxygenation and ventilation    Received pt on ventilator. Settings: SIMV 10, 500/80%/+11. CASS tube cleared, ETT patent and secure. AMBU bag and mask at bedside.     ABG:  Results for Renee West, Renee West (MRN [id number]) as of 11/30/2014 11:22    Ref. Range 11/30/2014 04:02   pH (POC) Latest Ref Range: 7.35-7.45   7.408   pCO2 (POC) Latest Ref Range: 35.0-45.0 MMHG 31.1 (L)   pO2 (POC) Latest Ref Range: 80-100 MMHG 129 (H)   HCO3 (POC) Latest Ref Range: 22-26 MMOL/L 19.8 (L)   sO2 (POC) Latest Ref Range: 92-97 % 99 (H)   Base deficit (POC) Latest Units: mmol/L 5   FIO2 (POC) Latest Units: % 80   Patient temp. Latest Units:   97.0   Specimen type (POC) Latest Units:   ARTERIAL   Set Rate Latest Units: bpm 10   Site Latest Units:   DRAWN FROM ARTERI...   Device: Latest Units:   VENT   Total resp. rate Latest Units:   17   Mode Latest Units:   SIMV   Tidal volume Latest Units: ml 500   Volume control plus Latest Units:   YES   PEEP/CPAP (POC) Latest Units: cmH2O 11   Pressure support Latest Units: cmH2O 10   Allens test (POC) Latest Units:   N/A   Inspiratory Time Latest Units: sec 0.8     X-ray:  Indication: Respiratory failure, intubation, follow-up examination.     Comparison: Several prior exams, most recently Nov 28, 2014.     Findings:  Portable upright AP view of the chest was obtained. There is an  endotracheal tube present approximately 4 cm above the carina. A right  peripherally inserted central venous catheter projects over the superior  cavoatrial junction. A nasoenteric feeding tube is looped within the patient's  large hiatal hernia, with the weighted tip present just below the  gastroesophageal junction.     Mild perihilar opacities are present, improved from the prior series of  examinations. No pneumothorax or large pleural effusion. Cardiac size and  mediastinal contours are stable. No acute osseous abnormality.     Impression:      1. Endotracheal tube and right peripherally inserted central catheter in  position as above.  2. Nasoenteric feeding tube looped within a large hiatal hernia, weighted tip  near the GE junction. This finding discussed with Diannia RuderKara, neuro ICU nurse caring  for the patient by Dr. Karl LukeSlat at 8:51 AM on 11/29/2014.  3. Mild persistent but improved perihilar opacities, which may reflect improved  atelectasis or pulmonary edema.      0853 FiO2 decreased to 70%, PEEP decreased to 10.    0942 Aerosol rx given, pt tolerated passively.

## 2014-11-30 NOTE — Progress Notes (Signed)
INFECTIOUS DISEASE FOLLOW UP NOTE :-     Admit Date: 11/28/2014    ABX;      Current  Prior    Vanco, Meropenem 5/30-2      ASSESSMENT: -> RECS     SIRS starting 5/30 ? Infectious vs non infectious like central fever  - Source is unclear  - Blcx are negative. UA initially was neg but now with pyuria. CSF g/s neg. CXR appear more fluid overload rather than infiltrates  - Lines and EVD very new  - on pressors   ?? - cont broad spectrum CNS dosed abx for now   - will streamline with more cx data and clinical response??  - check procalcitonin in am  Mild leucocytosis?? - monitor??   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 - Mx per Dr Excell Seltzer and Dr Su Ley??   Resp failure- s/p intubation -5/30?? - per others??   H/o DVT on coumadin  - currently on hold?? 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   Hypothyroiod?? ??           MICROBIOLOGY:   5/30 ucx ntd  5/31 Blcx x2 NTD,           CSF g/s neg          Ucx neg           resp cx -IP     LINES AND CATHETERS:   EVD 5/30  PICC 5/30    SUBJECTIVE :     Interval notes reviewed. Pt fever coming down, remains intubated. On pressors     OBJECTIVE     BP 123/70 mmHg   Pulse 69   Temp(Src) 98.4 ??F (36.9 ??C)   Resp 16   Ht  (1.676 m)   Wt 107.5 kg (236 lb 15.9 oz)   BMI 38.27 kg/m2   SpO2 100%    Temp (24hrs), Avg:91.5 ??F (33.1 ??C), Min:35.5 ??F (1.9 ??C), Max:99.5 ??F (37.5 ??C)    GEN - 59yr old female not in any distress, intubated, not sedated currently  RESP- ctab   CVS- s1s2 normal, no murmur   ABD- soft, NT, BS present  EXT- no edema    SKIN - no rash   NEURO - EVD present, b/l upper ext moving on command , working with PT    Labs: Results:   Chemistry Recent Labs      11/30/14   0355  11/29/14   0315  11/28/14   2000  11/28/14   1145  11/28/14   0530   GLU  122*  99   --    --   207*   NA  150*  145   --    --   140   K  3.7  4.6  3.8   --   2.6*   CL  121*  113*   --    --   108   CO2  20*  21   --    --   23   BUN  10  11   --    --   15   CREA  0.61  0.99   --    --  0.89   CA  6.2*  7.0*   --    --   7.2*   AGAP  9  11   --    --   9   BUCR  16  11*   --    --   17   AP   --    --    --   122*  120*   TP   --    --    --   7.1  6.9   ALB   --    --    --   2.8*  2.9*   GLOB   --    --    --   4.3*  4.0   AGRAT   --    --    --   0.7*  0.7*      CBC w/Diff Recent Labs      11/30/14   0355  11/29/14   0315  11/28/14   1640  11/28/14   0530  11/28/14   0030   WBC  8.0  12.7   --   13.9*  11.5 RBC  3.00*  3.92*   --   3.94*  4.12*   HGB  8.3*  10.9*  11.2*  11.0*  11.5*   HCT  25.9*  33.6*  34.1*  34.3*  35.4   PLT  124*  160   --   193  222   GRANS   --    --    --   86*  63   LYMPH   --    --    --   10*  26   EOS   --    --    --   0  1          RADIOLOGY :    CXR 6/1 - pending      Radford PaxSushma Taysen Bushart, MD  November 30, 2014  [facility]  [phone]

## 2014-11-30 NOTE — Progress Notes (Signed)
NEUROSURGERY PROGRESS NOTE     SURGERY DATE: 11/28/2014              SAH# 3  Dx:  SAH (subarachnoid hemorrhage) (HCC)        SUBJECTIVE:  S/p LVA sacrifice yesterday after passing BTO  EVD lowered to 15 with improvement in exam    EXAM:    Filed Vitals:    11/30/14 0600 11/30/14 0700 11/30/14 0800 11/30/14 0853   BP: 104/61 117/68 123/70    Pulse: 68 68 67 69   Temp:   98.4 ??F (36.9 ??C)    Resp:   20 16   Height:       Weight:       SpO2: 100% 100% 100% 100%       Intubated, opens eyes, PERRL, FC now bilat    LABS:   Recent Results (from the past 24 hour(s))   EKG, 12 LEAD, SUBSEQUENT    Collection Time: 11/29/14  9:31 AM   Result Value Ref Range    Ventricular Rate 63 BPM    Atrial Rate 63 BPM    P-R Interval 178 ms    QRS Duration 94 ms    Q-T Interval 514 ms    QTC Calculation (Bezet) 525 ms    Calculated P Axis 16 degrees    Calculated R Axis 39 degrees    Calculated T Axis 54 degrees    Diagnosis       Normal sinus rhythm  Prolonged QT  Abnormal ECG  When compared with ECG of 28-Nov-2014 17:01,  Vent. rate has decreased BY  40 BPM     LACTIC ACID, PLASMA    Collection Time: 11/29/14 11:16 AM   Result Value Ref Range    Lactic acid 1.2 0.4 - 2.0 MMOL/L   CULTURE, RESPIRATORY/SPUTUM/BRONCH W GRAM STAIN    Collection Time: 11/29/14 12:00 PM   Result Value Ref Range    Special Requests: NO SPECIAL REQUESTS      GRAM STAIN MANY  WBC'S        GRAM STAIN NO ORGANISMS SEEN      Culture result: PENDING    GLUCOSE, POC    Collection Time: 11/29/14 12:30 PM   Result Value Ref Range    Glucose (POC) 81 70 - 110 mg/dL   POC ACTIVATED CLOTTING TIME    Collection Time: 11/29/14  1:47 PM   Result Value Ref Range    Activated Clotting Time (POC) 147 (H) 79 - 138 SECS   POC ACTIVATED CLOTTING TIME    Collection Time: 11/29/14  3:12 PM   Result Value Ref Range    Activated Clotting Time (POC) 214 (H) 79 - 138 SECS   POC ACTIVATED CLOTTING TIME    Collection Time: 11/29/14  3:45 PM   Result Value Ref Range     Activated Clotting Time (POC) 233 (H) 79 - 138 SECS   POC ACTIVATED CLOTTING TIME    Collection Time: 11/29/14  4:24 PM   Result Value Ref Range    Activated Clotting Time (POC) 239 (H) 79 - 138 SECS   POC G3    Collection Time: 11/29/14 10:20 PM   Result Value Ref Range    Device: VENT      FIO2 (POC) 0.55 %    pH (POC) 7.399 7.35 - 7.45      pCO2 (POC) 28.2 (L) 35.0 - 45.0 MMHG    pO2 (POC) 52 (L) 80 - 100 MMHG  HCO3 (POC) 17.7 (L) 22 - 26 MMOL/L    sO2 (POC) 89 (L) 92 - 97 %    Base deficit (POC) 7 mmol/L    Mode SIMV      Tidal volume 500 ml    Set Rate 10 bpm    PEEP/CPAP (POC) 11 cmH2O    Pressure support 10 cmH2O    Allens test (POC) N/A      Inspiratory Time 0.8 sec    Total resp. rate 19      Site DRAWN FROM ARTERIAL LINE      Patient temp. 95.9      Specimen type (POC) ARTERIAL      Performed by De Nurse     Volume control plus YES     POC G3    Collection Time: 11/29/14 11:03 PM   Result Value Ref Range    Device: VENT      FIO2 (POC) 0.80 %    pH (POC) 7.347 (L) 7.35 - 7.45      pCO2 (POC) 34.4 (L) 35.0 - 45.0 MMHG    pO2 (POC) 95 80 - 100 MMHG    HCO3 (POC) 18.9 (L) 22 - 26 MMOL/L    sO2 (POC) 97 92 - 97 %    Base deficit (POC) 7 mmol/L    Mode SIMV      Tidal volume 500 ml    Set Rate 10 bpm    PEEP/CPAP (POC) 11 cmH2O    Pressure support 10 cmH2O    Allens test (POC) N/A      Inspiratory Time 0.8 sec    Total resp. rate 17      Site DRAWN FROM ARTERIAL LINE      Specimen type (POC) ARTERIAL      Performed by De Nurse     Volume control plus YES     GLUCOSE, POC    Collection Time: 11/30/14 12:47 AM   Result Value Ref Range    Glucose (POC) 106 70 - 110 mg/dL   CBC W/O DIFF    Collection Time: 11/30/14  3:55 AM   Result Value Ref Range    WBC 8.0 4.6 - 13.2 K/uL    RBC 3.00 (L) 4.20 - 5.30 M/uL    HGB 8.3 (L) 12.0 - 16.0 g/dL    HCT 16.1 (L) 09.6 - 45.0 %    MCV 86.3 74.0 - 97.0 FL    MCH 27.7 24.0 - 34.0 PG    MCHC 32.0 31.0 - 37.0 g/dL    RDW 04.5 (H) 40.9 - 14.5 %     PLATELET 124 (L) 135 - 420 K/uL    MPV 10.7 9.2 - 11.8 FL   PROTHROMBIN TIME + INR    Collection Time: 11/30/14  3:55 AM   Result Value Ref Range    Prothrombin time 16.3 (H) 11.5 - 15.2 sec    INR 1.4 (H) 0.8 - 1.2     PTT    Collection Time: 11/30/14  3:55 AM   Result Value Ref Range    aPTT 40.0 (H) 24.6 - 37.7 SEC   METABOLIC PANEL, BASIC    Collection Time: 11/30/14  3:55 AM   Result Value Ref Range    Sodium 150 (H) 136 - 145 mmol/L    Potassium 3.7 3.5 - 5.5 mmol/L    Chloride 121 (H) 100 - 108 mmol/L    CO2 20 (L) 21 - 32 mmol/L    Anion gap  9 3.0 - 18 mmol/L    Glucose 122 (H) 74 - 99 mg/dL    BUN 10 7.0 - 18 MG/DL    Creatinine 0.980.61 0.6 - 1.3 MG/DL    BUN/Creatinine ratio 16 12 - 20      GFR est AA >60 >60 ml/min/1.2073m2    GFR est non-AA >60 >60 ml/min/1.6873m2    Calcium 6.2 (L) 8.5 - 10.1 MG/DL   CALCIUM, IONIZED    Collection Time: 11/30/14  3:55 AM   Result Value Ref Range    Ionized Calcium 0.99 (L) 1.12 - 1.32 MMOL/L   MAGNESIUM    Collection Time: 11/30/14  3:55 AM   Result Value Ref Range    Magnesium 2.4 1.8 - 2.4 mg/dL   PHOSPHORUS    Collection Time: 11/30/14  3:55 AM   Result Value Ref Range    Phosphorus 2.1 (L) 2.5 - 4.9 MG/DL   CARDIAC PANEL,(CK, CKMB & TROPONIN)    Collection Time: 11/30/14  3:55 AM   Result Value Ref Range    CK 930 (H) 26 - 192 U/L    CK - MB 5.7 (H) 0.5 - 3.6 ng/ml    CK-MB Index 0.6 0.0 - 4.0 %    Troponin-I, Qt. 0.17 (H) 0.0 - 0.045 NG/ML   POC G3    Collection Time: 11/30/14  4:02 AM   Result Value Ref Range    Device: VENT      FIO2 (POC) 80 %    pH (POC) 7.408 7.35 - 7.45      pCO2 (POC) 31.1 (L) 35.0 - 45.0 MMHG    pO2 (POC) 129 (H) 80 - 100 MMHG    HCO3 (POC) 19.8 (L) 22 - 26 MMOL/L    sO2 (POC) 99 (H) 92 - 97 %    Base deficit (POC) 5 mmol/L    Mode SIMV      Tidal volume 500 ml    Set Rate 10 bpm    PEEP/CPAP (POC) 11 cmH2O    Pressure support 10 cmH2O    Allens test (POC) N/A      Inspiratory Time 0.8 sec    Total resp. rate 17       Site DRAWN FROM ARTERIAL LINE      Patient temp. 97.0      Specimen type (POC) ARTERIAL      Performed by [place]     Volume control plus YES              IMPRESSION: SAH, s/p L VA sacrifice for dissecting aneurysm    PLAN:    EVD at 15  Nimodipine  bp management      Bethann GooJOSEPH L Nikkol Pai, MD  November 30, 2014  9:28 AM

## 2014-12-01 ENCOUNTER — Inpatient Hospital Stay
Admit: 2014-12-01 | Payer: BLUE CROSS/BLUE SHIELD | Primary: Student in an Organized Health Care Education/Training Program

## 2014-12-01 LAB — EKG, 12 LEAD, SUBSEQUENT
Atrial Rate: 63 {beats}/min
Calculated P Axis: 16 degrees
Calculated R Axis: 39 degrees
Calculated T Axis: 54 degrees
Diagnosis: NORMAL
P-R Interval: 178 ms
Q-T Interval: 514 ms
QRS Duration: 94 ms
QTC Calculation (Bezet): 525 ms
Ventricular Rate: 63 {beats}/min

## 2014-12-01 LAB — TYPE & SCREEN
ABO/Rh(D): AB POS
Antibody screen: NEGATIVE
Status of unit: TRANSFUSED
Unit division: 0

## 2014-12-01 LAB — BLOOD GAS, ARTERIAL POC
Base excess (POC): 1 mmol/L
FIO2 (POC): 60 %
HCO3 (POC): 24.8 MMOL/L (ref 22–26)
Inspiratory Time: 0.8 s
PEEP/CPAP (POC): 8 cmH2O
Patient temp.: 99.9
Pressure support: 7 cmH2O
Set Rate: 10 {beats}/min
Tidal volume: 500 ml
Total resp. rate: 15
pCO2 (POC): 35.8 MMHG (ref 35.0–45.0)
pH (POC): 7.452 — ABNORMAL HIGH (ref 7.35–7.45)
pO2 (POC): 128 MMHG — ABNORMAL HIGH (ref 80–100)
sO2 (POC): 99 % — ABNORMAL HIGH (ref 92–97)

## 2014-12-01 LAB — PHOSPHORUS
Phosphorus: 2.4 MG/DL — ABNORMAL LOW (ref 2.5–4.9)
Phosphorus: 2.4 MG/DL — ABNORMAL LOW (ref 2.5–4.9)
Phosphorus: 2.8 MG/DL (ref 2.5–4.9)

## 2014-12-01 LAB — METABOLIC PANEL, BASIC
Anion gap: 9 mmol/L (ref 3.0–18)
BUN/Creatinine ratio: 15 (ref 12–20)
BUN: 10 MG/DL (ref 7.0–18)
CO2: 22 mmol/L (ref 21–32)
Calcium: 6.6 MG/DL — ABNORMAL LOW (ref 8.5–10.1)
Chloride: 121 mmol/L — ABNORMAL HIGH (ref 100–108)
Creatinine: 0.67 MG/DL (ref 0.6–1.3)
GFR est AA: >60 mL/min/{1.73_m2} (ref >60)
GFR est non-AA: >60 mL/min/{1.73_m2} (ref >60)
Glucose: 113 mg/dL — ABNORMAL HIGH (ref 74–99)
Potassium: 3.6 mmol/L (ref 3.5–5.5)
Sodium: 152 mmol/L — ABNORMAL HIGH (ref 136–145)

## 2014-12-01 LAB — CELL COUNT, CSF
CSF EOS: 2 %
CSF LYMPH: 47 %
CSF MONO: 29 %
CSF Neutrophils: 22 % — ABNORMAL HIGH (ref 0–6)
CSF WBCs: 160 /mm3 — ABNORMAL HIGH (ref <5)

## 2014-12-01 LAB — CBC W/O DIFF
HCT: 24.6 % — ABNORMAL LOW (ref 35.0–45.0)
HGB: 8.1 g/dL — ABNORMAL LOW (ref 12.0–16.0)
MCH: 27.7 PG (ref 24.0–34.0)
MCHC: 32.9 g/dL (ref 31.0–37.0)
MCV: 84.2 FL (ref 74.0–97.0)
MPV: 10.7 FL (ref 9.2–11.8)
PLATELET: 116 10*3/uL — ABNORMAL LOW (ref 135–420)
RBC: 2.92 M/uL — ABNORMAL LOW (ref 4.20–5.30)
RDW: 15 % — ABNORMAL HIGH (ref 11.6–14.5)
WBC: 7.9 10*3/uL (ref 4.6–13.2)

## 2014-12-01 LAB — GLUCOSE, POC
Glucose (POC): 114 mg/dL — ABNORMAL HIGH (ref 70–110)
Glucose (POC): 101 mg/dL (ref 70–110)

## 2014-12-01 LAB — CARDIAC PANEL,(CK, CKMB & TROPONIN)
CK - MB: 3.6 ng/ml (ref 0.5–3.6)
CK-MB Index: 0.4 % (ref 0.0–4.0)
CK: 826 U/L — ABNORMAL HIGH (ref 26–192)
Troponin-I, QT: 0.06 NG/ML — ABNORMAL HIGH (ref 0.0–0.045)

## 2014-12-01 LAB — VANCOMYCIN, TROUGH
Reported dose date: 20160601
Reported dose time:: 1100
Vancomycin,trough: 8.9 ug/mL — ABNORMAL LOW (ref 10.0–20.0)

## 2014-12-01 LAB — HEPATIC FUNCTION PANEL
A-G Ratio: 0.6 — ABNORMAL LOW (ref 0.8–1.7)
ALT (SGPT): 22 U/L (ref 13–56)
AST (SGOT): 37 U/L (ref 15–37)
Albumin: 2.2 g/dL — ABNORMAL LOW (ref 3.4–5.0)
Alk. phosphatase: 87 U/L (ref 45–117)
Bilirubin, direct: 0.1 MG/DL (ref 0.0–0.2)
Bilirubin, total: 0.7 MG/DL (ref 0.2–1.0)
Globulin: 4 g/dL (ref 2.0–4.0)
Protein, total: 6.2 g/dL — ABNORMAL LOW (ref 6.4–8.2)

## 2014-12-01 LAB — HGB & HCT
HCT: 24.7 % — ABNORMAL LOW (ref 35.0–45.0)
HCT: 28.9 % — ABNORMAL LOW (ref 35.0–45.0)
HGB: 8.1 g/dL — ABNORMAL LOW (ref 12.0–16.0)
HGB: 9.7 g/dL — ABNORMAL LOW (ref 12.0–16.0)

## 2014-12-01 LAB — PROTHROMBIN TIME + INR
INR: 1.2 (ref 0.8–1.2)
Prothrombin time: 15 s (ref 11.5–15.2)

## 2014-12-01 LAB — NT-PRO BNP: NT pro-BNP: 1197 PG/ML — ABNORMAL HIGH (ref 0–900)

## 2014-12-01 LAB — CULTURE, URINE
Culture result:: NO GROWTH
Culture: NO GROWTH

## 2014-12-01 LAB — CALCIUM, IONIZED: Ionized Calcium: 1.05 MMOL/L — ABNORMAL LOW (ref 1.12–1.32)

## 2014-12-01 LAB — POTASSIUM
Potassium: 3.9 mmol/L (ref 3.5–5.5)
Potassium: 3.7 mmol/L (ref 3.5–5.5)

## 2014-12-01 LAB — MAGNESIUM
Magnesium: 2.5 mg/dL — ABNORMAL HIGH (ref 1.8–2.4)
Magnesium: 2.5 mg/dL — ABNORMAL HIGH (ref 1.8–2.4)

## 2014-12-01 LAB — GLUCOSE, CSF: Glucose,CSF: 82 MG/DL — ABNORMAL HIGH (ref 40–70)

## 2014-12-01 LAB — PTT: aPTT: 32.8 s (ref 24.6–37.7)

## 2014-12-01 LAB — AMMONIA: Ammonia, plasma: 31 umol/L (ref 11–32)

## 2014-12-01 LAB — LIPASE: Lipase: 999 U/L — ABNORMAL HIGH (ref 73–393)

## 2014-12-01 LAB — LACTIC ACID: Lactic acid: 0.9 MMOL/L (ref 0.4–2.0)

## 2014-12-01 LAB — AMYLASE: Amylase: 219 U/L — ABNORMAL HIGH (ref 25–115)

## 2014-12-01 LAB — TYPE AND SCREEN
ABO/Rh: AB POS
Antibody Screen: NEGATIVE
Status: TRANSFUSED
Unit Divison: 0

## 2014-12-01 MED ORDER — POTASSIUM CHLORIDE 10 % ORAL LIQUID
20 mEq/15 mL | ORAL | Status: AC
Start: 2014-12-01 — End: 2014-12-01
  Administered 2014-12-01: 10:00:00 via ORAL

## 2014-12-01 MED ORDER — FUROSEMIDE 10 MG/ML IJ SOLN
10 mg/mL | Freq: Once | INTRAMUSCULAR | Status: AC
Start: 2014-12-01 — End: 2014-12-01
  Administered 2014-12-01: 14:00:00 via INTRAVENOUS

## 2014-12-01 MED ORDER — D5-NS WITH POTASSIUM CHLORIDE 40 MEQ/L IV
40 mEq/L | INTRAVENOUS | Status: DC
Start: 2014-12-01 — End: 2014-12-02
  Administered 2014-12-01: 10:00:00 via INTRAVENOUS

## 2014-12-01 MED ORDER — SODIUM CHLORIDE 0.9 % IV
INTRAVENOUS | Status: DC | PRN
Start: 2014-12-01 — End: 2014-12-05

## 2014-12-01 MED ORDER — POTASSIUM CHLORIDE SR 10 MEQ TAB, PARTICLES/CRYSTALS
10 mEq | Freq: Once | ORAL | Status: AC
Start: 2014-12-01 — End: 2014-12-01
  Administered 2014-12-01: 18:00:00 via ORAL

## 2014-12-01 MED ORDER — POTASSIUM CHLORIDE SR 20 MEQ TAB, PARTICLES/CRYSTALS
20 mEq | Freq: Once | ORAL | Status: DC
Start: 2014-12-01 — End: 2014-12-01
  Administered 2014-12-02: via ORAL

## 2014-12-01 MED ORDER — POTASSIUM & SODIUM PHOSPHATES 280 MG-160 MG-250 MG ORAL POWDER PACKET
280-160-250 mg | ORAL | Status: AC
Start: 2014-12-01 — End: 2014-12-01
  Administered 2014-12-01 (×2): via ORAL

## 2014-12-01 MED ORDER — SODIUM CHLORIDE 0.9 % IV
10 gram | Freq: Three times a day (TID) | INTRAVENOUS | Status: DC
Start: 2014-12-01 — End: 2014-12-04
  Administered 2014-12-01 – 2014-12-04 (×10): via INTRAVENOUS

## 2014-12-01 MED ORDER — PHARMACY VANCOMYCIN NOTE
Freq: Once | Status: AC
Start: 2014-12-01 — End: 2014-12-02
  Administered 2014-12-02: 12:00:00

## 2014-12-01 MED ORDER — SODIUM CHLORIDE 0.9 % IV PIGGY BACK
INTRAVENOUS | Status: AC
Start: 2014-12-01 — End: 2014-11-30
  Administered 2014-12-01: 04:00:00

## 2014-12-01 MED FILL — ACEPHEN 650 MG RECTAL SUPPOSITORY: 650 mg | RECTAL | Qty: 1

## 2014-12-01 MED FILL — IPRATROPIUM-ALBUTEROL 2.5 MG-0.5 MG/3 ML NEB SOLUTION: 2.5 mg-0.5 mg/3 ml | RESPIRATORY_TRACT | Qty: 3

## 2014-12-01 MED FILL — PHOS-NAK 280 MG-160 MG-250 MG ORAL POWDER PACKET: 280-160-250 mg | ORAL | Qty: 2

## 2014-12-01 MED FILL — SODIUM CHLORIDE 0.9 % IV PIGGY BACK: INTRAVENOUS | Qty: 50

## 2014-12-01 MED FILL — SODIUM CHLORIDE 0.9 % INJECTION: INTRAMUSCULAR | Qty: 10

## 2014-12-01 MED FILL — POTASSIUM CHLORIDE SR 10 MEQ TAB, PARTICLES/CRYSTALS: 10 mEq | ORAL | Qty: 1

## 2014-12-01 MED FILL — POTASSIUM CHLORIDE 20 MEQ/50 ML IV PIGGY BACK: 20 mEq/50 mL | INTRAVENOUS | Qty: 50

## 2014-12-01 MED FILL — NIMODIPINE 30 MG CAP: 30 mg | ORAL | Qty: 1

## 2014-12-01 MED FILL — CHLORHEXIDINE GLUCONATE 0.12 % MOUTHWASH: 0.12 % | Qty: 15

## 2014-12-01 MED FILL — VANCOMYCIN 10 GRAM IV SOLR: 10 gram | INTRAVENOUS | Qty: 1500

## 2014-12-01 MED FILL — PRECEDEX 100 MCG/ML INTRAVENOUS SOLUTION: 100 mcg/mL | INTRAVENOUS | Qty: 4

## 2014-12-01 MED FILL — PHENYLEPHRINE 90 MG/250ML INFUSION: 90 mg/ 250 ml | INTRAVENOUS | Qty: 250

## 2014-12-01 MED FILL — ASPIRIN 81 MG CHEWABLE TAB: 81 mg | ORAL | Qty: 1

## 2014-12-01 MED FILL — VAZCULEP 10 MG/ML INJECTION SOLUTION: 10 mg/mL | INTRAMUSCULAR | Qty: 200

## 2014-12-01 MED FILL — MAGNESIUM SULFATE 2 GRAM/50 ML IVPB: 2 gram/50 mL (4 %) | INTRAVENOUS | Qty: 50

## 2014-12-01 MED FILL — FOLIC ACID 5 MG/ML IJ SOLN: 5 mg/mL | INTRAMUSCULAR | Qty: 0.2

## 2014-12-01 MED FILL — POTASSIUM CHLORIDE 10 % ORAL LIQUID: 20 mEq/15 mL | ORAL | Qty: 30

## 2014-12-01 MED FILL — CEFAZOLIN 1 GRAM SOLUTION FOR INJECTION: 1 gram | INTRAMUSCULAR | Qty: 4

## 2014-12-01 MED FILL — TYLENOL 325 MG TABLET: 325 mg | ORAL | Qty: 2

## 2014-12-01 MED FILL — D5-NS WITH POTASSIUM CHLORIDE 40 MEQ/L IV: 40 mEq/L | INTRAVENOUS | Qty: 1000

## 2014-12-01 MED FILL — VENTOLIN HFA 90 MCG/ACTUATION AEROSOL INHALER: 90 mcg/actuation | RESPIRATORY_TRACT | Qty: 3

## 2014-12-01 MED FILL — VANCOMYCIN 1,000 MG IV SOLR: 1000 mg | INTRAVENOUS | Qty: 1000

## 2014-12-01 MED FILL — PHARMACY VANCOMYCIN NOTE: Qty: 1

## 2014-12-01 MED FILL — SODIUM CHLORIDE 0.9 % IV: INTRAVENOUS | Qty: 250

## 2014-12-01 MED FILL — SENNA PLUS 8.6 MG-50 MG TABLET: ORAL | Qty: 2

## 2014-12-01 MED FILL — CARDENE 20 MG/200 ML(0.1 MG/ML) IN SOD CHLOR(ISO-OSM) INTRAVENOUS SOLN: 20 mg/0 mL (0.1 mg/mL) | INTRAVENOUS | Qty: 13.83

## 2014-12-01 MED FILL — ROCURONIUM 10 MG/ML IV: 10 mg/mL | INTRAVENOUS | Qty: 150

## 2014-12-01 MED FILL — DIPRIVAN 10 MG/ML INTRAVENOUS EMULSION: 10 mg/mL | INTRAVENOUS | Qty: 2293.75

## 2014-12-01 MED FILL — FUROSEMIDE 10 MG/ML IJ SOLN: 10 mg/mL | INTRAMUSCULAR | Qty: 4

## 2014-12-01 MED FILL — MAGNESIUM SULFATE 2 GRAM/50 ML IVPB: 2 gram/50 mL (4 %) | INTRAVENOUS | Qty: 2

## 2014-12-01 MED FILL — FUROSEMIDE 10 MG/ML IJ SOLN: 10 mg/mL | INTRAMUSCULAR | Qty: 10

## 2014-12-01 MED FILL — SODIUM CHLORIDE 0.9 % IV PIGGY BACK: INTRAVENOUS | Qty: 100

## 2014-12-01 NOTE — Progress Notes (Addendum)
Neurovascular Progress Note      Patient: Renee West MRN: [id number]  SSN: [id number]    Date of Birth: 01-28-1956  Age: 59 y.o.  Sex: female      Events  Wean vent setting as patient tolerates.    EVD 15 cm.    Hgb remains low 8.1. Patient transfused another unit of PRBC    Vital Signs  Patient Vitals for the past 24 hrs:   Temp Pulse Resp BP SpO2   12/01/14 1650 - - - - 95 %   12/01/14 1630 - 80 - 123/63 mmHg 96 %   12/01/14 1615 - 81 - - 96 %   12/01/14 1600 (!) 100.8 ??F (38.2 ??C) 78 20 119/62 mmHg 96 %   12/01/14 1545 - 75 - - 95 %   12/01/14 1530 - 89 - 127/67 mmHg 100 %   12/01/14 1515 - 77 - - 96 %   12/01/14 1510 - 80 18 118/78 mmHg 96 %   12/01/14 1500 - 81 - 117/64 mmHg 98 %   12/01/14 1445 - 77 - - 98 %   12/01/14 1430 - 79 - 118/62 mmHg 98 %   12/01/14 1415 - 83 - - 98 %   12/01/14 1400 - 81 - 116/64 mmHg 98 %   12/01/14 1345 - 77 - - 98 %   12/01/14 1330 - 79 - 114/60 mmHg 97 %   12/01/14 1315 - 81 - - 98 %   12/01/14 1300 - 82 - 113/59 mmHg 98 %   12/01/14 1257 - 83 - 117/59 mmHg -   12/01/14 1245 - 82 - - 97 %   12/01/14 1230 - 84 - 117/59 mmHg 100 %   12/01/14 1215 - 86 - - 99 %   12/01/14 1200 100 ??F (37.8 ??C) 86 18 112/63 mmHg 99 %   12/01/14 1130 - 79 - 105/57 mmHg 99 %   12/01/14 1127 - 83 18 - 99 %   12/01/14 1115 - 80 - 112/58 mmHg -   12/01/14 1100 100 ??F (37.8 ??C) 90 - 112/58 mmHg 98 %   12/01/14 1030 - 90 - 113/66 mmHg 98 %   12/01/14 1000 99 ??F (37.2 ??C) 87 - 118/68 mmHg 98 %   12/01/14 0930 - 89 - 106/60 mmHg 99 %   12/01/14 0915 - 92 - - 99 %   12/01/14 0900 99.3 ??F (37.4 ??C) 89 - 102/56 mmHg 100 %   12/01/14 0845 - 82 - 100/54 mmHg 99 %   12/01/14 0843 - 82 15 - 100 %   12/01/14 0840 - - - - 100 %   12/01/14 0830 - 88 - 103/60 mmHg 100 %   12/01/14 0815 - 78 - - 99 %   12/01/14 0800 99.9 ??F (37.7 ??C) 77 16 106/58 mmHg 100 %   12/01/14 0745 - 79 - - 100 %   12/01/14 0730 - 82 - 98/59 mmHg 100 %   12/01/14 0700 - 87 14 95/58 mmHg 100 %   12/01/14 0630 - 89 - 95/61 mmHg 100 %    12/01/14 0600 - 84 13 101/56 mmHg 100 %   12/01/14 0500 - 81 20 98/53 mmHg 100 %   12/01/14 0430 - 86 - 105/57 mmHg 100 %   12/01/14 0400 99.7 ??F (37.6 ??C) 82 21 94/54 mmHg 100 %   12/01/14 0345 - 78 17 - 100 %   12/01/14 0330 - 85 -  94/51 mmHg 99 %   12/01/14 0300 - 85 13 107/60 mmHg 100 %   12/01/14 0230 - 86 - 107/56 mmHg 100 %   12/01/14 0200 - 85 13 105/54 mmHg 100 %   12/01/14 0130 - 85 - 99/53 mmHg 100 %   12/01/14 0100 - 92 14 108/59 mmHg 100 %   12/01/14 0030 - 95 - 106/59 mmHg 100 %   12/01/14 0000 99.9 ??F (37.7 ??C) 81 17 111/64 mmHg 100 %   11/30/14 2355 - 81 17 - 100 %   11/30/14 2349 - - - - 100 %   11/30/14 2330 - 81 - 99/56 mmHg 100 %   11/30/14 2300 - 73 20 107/62 mmHg 100 %   11/30/14 2230 - 81 - 124/69 mmHg 100 %   11/30/14 2215 - 77 - 110/62 mmHg 99 %   11/30/14 2200 - 84 22 106/57 mmHg 99 %   11/30/14 2145 - 73 - 134/71 mmHg 100 %   11/30/14 2030 - 90 - 141/87 mmHg 100 %   11/30/14 2000 99.9 ??F (37.7 ??C) 74 22 138/71 mmHg 100 %   11/30/14 1943 - 83 16 - 100 %   11/30/14 1900 99.9 ??F (37.7 ??C) 88 - 106/67 mmHg 100 %   11/30/14 1848 99.9 ??F (37.7 ??C) - - 104/70 mmHg -   11/30/14 1800 - 90 - 129/67 mmHg 100 %   11/30/14 1700 99.9 ??F (37.7 ??C) 83 - 107/57 mmHg 100 %       NIHSS Flow Sheet  Total: 0 (12/01/14 1600)     ICP:  9-12    Vent  Vent Settings  FIO2 (%): 35 %  CMV Rate Set: 13  SIMV Rate Set: 10  Back-Up Rate: 10  Vt Set (ml): 500 ml (back up Vt)  Pressure Support (cm H2O): 7 cm H2O  PEEP/VENT (cm H2O): 5 cm H20  Insp Time (sec): 0.8 sec  Insp Flow (l/min): 68 l/min  Insp Rise Time %: 30 %  Pressure Trigger: 2  Flow Trigger: 3  Expiratory Sensitivity: 15 %      Intake and Output    Intake/Output Summary (Last 24 hours) at 12/01/14 1657  Last data filed at 12/01/14 1500   Gross per 24 hour   Intake 1862.99 ml   Output   3144 ml   Net -1281.01 ml     CSF drainage:  241 ml over 24 hours    Data    Recent Results (from the past 24 hour(s))   VANCOMYCIN, TROUGH     Collection Time: 11/30/14 10:20 PM   Result Value Ref Range    Vancomycin,trough 8.9 (L) 10.0 - 20.0 ug/mL    Reported dose date: 25852778      Reported dose time: 1100      Reported dose: 1G UNITS   HGB & HCT    Collection Time: 12/01/14  1:00 AM   Result Value Ref Range    HGB 8.1 (L) 12.0 - 16.0 g/dL    HCT 24.7 (L) 35.0 - 45.0 %   PHOSPHORUS    Collection Time: 12/01/14  1:00 AM   Result Value Ref Range    Phosphorus 2.4 (L) 2.5 - 4.9 MG/DL   MAGNESIUM    Collection Time: 12/01/14  1:00 AM   Result Value Ref Range    Magnesium 2.5 (H) 1.8 - 2.4 mg/dL   POC G3    Collection Time: 12/01/14  3:47 AM  Result Value Ref Range    Device: VENT      FIO2 (POC) 60 %    pH (POC) 7.452 (H) 7.35 - 7.45      pCO2 (POC) 35.8 35.0 - 45.0 MMHG    pO2 (POC) 128 (H) 80 - 100 MMHG    HCO3 (POC) 24.8 22 - 26 MMOL/L    sO2 (POC) 99 (H) 92 - 97 %    Base excess (POC) 1 mmol/L    Mode SIMV      Tidal volume 500 ml    Set Rate 10 bpm    PEEP/CPAP (POC) 8.0 cmH2O    Pressure support 7 cmH2O    Allens test (POC) N/A      Inspiratory Time 0.8 sec    Total resp. rate 15      Site DRAWN FROM ARTERIAL LINE      Patient temp. 99.9      Specimen type (POC) ARTERIAL      Performed by Royal Piedra     Volume control plus YES     CBC W/O DIFF    Collection Time: 12/01/14  4:00 AM   Result Value Ref Range    WBC 7.9 4.6 - 13.2 K/uL    RBC 2.92 (L) 4.20 - 5.30 M/uL    HGB 8.1 (L) 12.0 - 16.0 g/dL    HCT 24.6 (L) 35.0 - 45.0 %    MCV 84.2 74.0 - 97.0 FL    MCH 27.7 24.0 - 34.0 PG    MCHC 32.9 31.0 - 37.0 g/dL    RDW 15.0 (H) 11.6 - 14.5 %    PLATELET 116 (L) 135 - 420 K/uL    MPV 10.7 9.2 - 11.8 FL   PROTHROMBIN TIME + INR    Collection Time: 12/01/14  4:00 AM   Result Value Ref Range    Prothrombin time 15.0 11.5 - 15.2 sec    INR 1.2 0.8 - 1.2     PTT    Collection Time: 12/01/14  4:00 AM   Result Value Ref Range    aPTT 32.8 24.6 - 23.7 SEC   METABOLIC PANEL, BASIC    Collection Time: 12/01/14  4:00 AM   Result Value Ref Range     Sodium 152 (H) 136 - 145 mmol/L    Potassium 3.6 3.5 - 5.5 mmol/L    Chloride 121 (H) 100 - 108 mmol/L    CO2 22 21 - 32 mmol/L    Anion gap 9 3.0 - 18 mmol/L    Glucose 113 (H) 74 - 99 mg/dL    BUN 10 7.0 - 18 MG/DL    Creatinine 0.67 0.6 - 1.3 MG/DL    BUN/Creatinine ratio 15 12 - 20      GFR est AA >60 >60 ml/min/1.63m    GFR est non-AA >60 >60 ml/min/1.729m   Calcium 6.6 (L) 8.5 - 10.1 MG/DL   CALCIUM, IONIZED    Collection Time: 12/01/14  4:00 AM   Result Value Ref Range    Ionized Calcium 1.05 (L) 1.12 - 1.32 MMOL/L   MAGNESIUM    Collection Time: 12/01/14  4:00 AM   Result Value Ref Range    Magnesium 2.5 (H) 1.8 - 2.4 mg/dL   PHOSPHORUS    Collection Time: 12/01/14  4:00 AM   Result Value Ref Range    Phosphorus 2.4 (L) 2.5 - 4.9 MG/DL   CARDIAC PANEL,(CK, CKMB & TROPONIN)    Collection  Time: 12/01/14  4:00 AM   Result Value Ref Range    CK 826 (H) 26 - 192 U/L    CK - MB 3.6 0.5 - 3.6 ng/ml    CK-MB Index 0.4 0.0 - 4.0 %    Troponin-I, Qt. 0.06 (H) 0.0 - 0.045 NG/ML   CELL COUNT, CSF    Collection Time: 12/01/14  4:00 AM   Result Value Ref Range    CSF TUBE NO. BAGGED SPECIMEN     CSF COLOR XANTHOCHROMIA      CSF APPEARANCE BLOODY      CSF RBCS CANNOT BE CALCULATED 0 /cu mm    CSF WBCS 160 (H) <5 /cu mm    CSF SEGS 22 (H) 0 - 6 %    CSF LYMPH 47 %    CSF MONO 29 %    CSF EOS 2 %   GLUCOSE, CSF    Collection Time: 12/01/14  4:00 AM   Result Value Ref Range    Tube No. BAGGED SPECIMEN     Glucose,CSF 82 (H) 40 - 70 MG/DL   LACTIC ACID, PLASMA    Collection Time: 12/01/14  4:00 AM   Result Value Ref Range    Lactic acid 0.9 0.4 - 2.0 MMOL/L   TYPE & SCREEN    Collection Time: 12/01/14  4:00 AM   Result Value Ref Range    Crossmatch Expiration 12/04/2014     ABO/Rh(D) AB POSITIVE     Antibody screen NEG     CALLED TO: JACKIE, 2600, ON 12/01/2014 AT 0931 BY TLG     Unit number [id number]     Blood component type RC LR AS1     Unit division 00     Status of unit ISSUED     Crossmatch result Compatible     CULTURE, CSF W GRAM STAIN    Collection Time: 12/01/14  4:00 AM   Result Value Ref Range    Special Requests: BAGGED SPECIMEN     GRAM STAIN FEW  WBC'S        GRAM STAIN NO ORGANISMS SEEN      Culture result: PENDING    GLUCOSE, POC    Collection Time: 12/01/14  6:37 AM   Result Value Ref Range    Glucose (POC) 114 (H) 70 - 110 mg/dL   PRO-BNP    Collection Time: 12/01/14  7:00 AM   Result Value Ref Range    NT pro-BNP 1197 (H) 0 - 900 PG/ML   HEPATIC FUNCTION PANEL    Collection Time: 12/01/14 12:20 PM   Result Value Ref Range    Protein, total 6.2 (L) 6.4 - 8.2 g/dL    Albumin 2.2 (L) 3.4 - 5.0 g/dL    Globulin 4.0 2.0 - 4.0 g/dL    A-G Ratio 0.6 (L) 0.8 - 1.7      Bilirubin, total 0.7 0.2 - 1.0 MG/DL    Bilirubin, direct 0.1 0.0 - 0.2 MG/DL    Alk. phosphatase 87 45 - 117 U/L    AST 37 15 - 37 U/L    ALT 22 13 - 56 U/L   AMYLASE    Collection Time: 12/01/14 12:20 PM   Result Value Ref Range    Amylase 219 (H) 25 - 115 U/L   LIPASE    Collection Time: 12/01/14 12:20 PM   Result Value Ref Range    Lipase 999 (H) 73 - 393 U/L   AMMONIA  Collection Time: 12/01/14 12:20 PM   Result Value Ref Range    Ammonia 31 11 - 32 UMOL/L   POTASSIUM    Collection Time: 12/01/14 12:20 PM   Result Value Ref Range    Potassium 3.9 3.5 - 5.5 mmol/L   PHOSPHORUS    Collection Time: 12/01/14 12:20 PM   Result Value Ref Range    Phosphorus 2.8 2.5 - 4.9 MG/DL   GLUCOSE, POC    Collection Time: 12/01/14 12:41 PM   Result Value Ref Range    Glucose (POC) 101 70 - 110 mg/dL        Physical Exam  General: Supine, HOB; 30 degrees.  Neuro: Patient is opens eyes to verbal stimulation. Bilateral proptosis. Pupils 2 and reactive. She does follow simple fingers by holding fingers up when asked to do so. She lifts all extremities off bed spontaneously.    Wound: Right frontal EVD in place with serosanguinous.   Pulmonary: Decrease breath sounds throughout.  CV: S1 and S2 heard.  Abd: Obese, NT, ND, NG. DHT in place.   Extremities: Distal pulses 1+.      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     1.) Neurologic: Neuro exam stable. EVD 15 cm. Aspirin therapy started to help prevent embolic formation along coils. Repeat cerebral angiography likely Monday.  CT for neuro changes.SBP goal for pressors 90-120 mmHg and 120-150 mmHg for antihypertensives.    Nimodipine for 21 days; dose split every 2 hours. Minimize sedation to maintain safety on ventilator.    PT/OT at bed level.     2.) Cardiovascular: Pressors weaned off. TTE no RWMA. Troponin trending downward. Patient diuresed with furosemide again today. BNP: 1197. Control HR first before peripheral antihypertensives.     3.) Pulmonary: PCXR with improvement in perihilar opacities.  LLL atelectasis versus aspiration pneumonitis also improved.  Adjust vent to maintain normoxia and eucapnia. Nebs standing and as needed.    4.) Endocrine: Insulin scale.     5.) Hematology: Hgb lower than ideal goal of 10 for cerebral ischemia and myocardial ischemia. Transfused 1 unit of PRBC on yesterday without much improvement in hgb. Patient transfused today 1 unit of PRBC. Repeat Hgb for later this evening. LE [place] no acute DVT. SCDs. No pharmacologic DVT prophylaxis or treatment for now.  Iron deficiency anemia. Replete iron iv when hemodynamics more stable.     6.) Fluids, Electrolytes, and Nutrition: Normotonic hydration; may need hypertonic if cerebral edema is problematic. Vitamins given.     7.) Gastrointestinal: Lipase: 999 and amylase: 219. RUQ ultrasound to assess gallbladder. Antiemtics and PPI. Hemoccult stools as iron deficiency anemia while on warfarin.    8.) LTD: Foley in place. A-line in place. PICC placed.  DHT.    9.) ID: Fever of unclear etiology. PCXR with improvement in perihilar  opacities.  Mini BAL revealed rare gram - rods and rare candida albicans. BCx and CSF studies no growth thus far. Could be central but remain vigilant for infection.  Broad spectrum antibiotics to penetrate CNS with EVD in place until culture data can guide adjustment.  Antipyretics and external cooling systems if needed to maintain euthermia. Repeat studies if temp > 100.5 every 24 hours.     40 minutes of neurocritical care time was provided independent of procedures.      Vira Blanco, MD

## 2014-12-01 NOTE — Progress Notes (Addendum)
Neurological exam reveals alert, oriented, normal speech, no focal findings or movement disorder noted.      2315  dht not flushing, repositioned as it had pulled out a few centimeters, still will not flush. unable to administer nimotop and tylenol    0100  dht now flushing. will continue to monitor. tylenol suppository given    protein packet administered.     0300  patient had a 7 beat run of v-tach    arterial line removed from L arm, hemostasis achieved after applying manual pressure for 10 minutes. will continue to monitor    Bedside and Verbal shift change report given to Spero CurbJerilyn C, RN (oncoming nurse) by Leanne LovelyJudonne D Baxter, RN   (offgoing nurse). Report included the following information SBAR, Intake/Output, MAR and Recent Results.

## 2014-12-01 NOTE — Progress Notes (Signed)
Pt as assessed, VSS No pressors or antihypertensives on at present.    Ventric remains in place at 15. S/S drainage.    Weaning patient all shift and extubated per Dr. Bernette RedbirdBaker's order at 1827. Doing well.     Pt with NIHSS 0, Speech is clear and succinct after extubation though somewhat horse.     Report given to Renee West. RN

## 2014-12-01 NOTE — Progress Notes (Signed)
Patient completed ROM as follows.  RUE X 10 (Wrist flex/extension, elbow flex/extension, shoulder int/external rotation)  LUE X 10 (Wrist flex/extension, elbow flex/extension,  shoulder int/external rotation)          Pt participation was:  (X) AROM/ AAROM  () PROM    Pain Level Before FMP: 0  Pain Level After FMP: 0  Non Verbal Pain Scale :     (X) Alerted Nursing.  (X) Call bell within patient reach.  (X) Pt resting in no apparent distress in bed.    NOTE:     Pt presented supine with HOB around 30 degrees, EVD placed, and intubated.?? ??  Pt was agreeable to perform ROM exercises in bed. ??  Pt actively executed exercises with minimal verbal cueing. ??  Pt was able to write with pen and paper?? - reported to have a "dry throat". Notified RN. ??  Pillows were placed bilaterally to support arms and shoulders - mild edema localized around dorsal region of hands.  Wrist restraints were replaced after FMP.     Renee West, [facility] [employer]

## 2014-12-01 NOTE — Progress Notes (Addendum)
Patient completed ROM as follows.    RLE X 10 (dorsi/plantarflexion, knee flex/extension, hip ab/adduction, hip flex/extension)  LLE X 10 (dorsi/plantarflexion, knee flex/extension, hip ab/adduction, hip flex/extension)    Pt participation was:  (X) AROM/ AAROM  () PROM    Pain Level Before FMP: 0  Pain Level After FMP: 0  Non Verbal Pain Scale :     (X) Alerted Nursing.  (X) Call bell within patient reach.  (X) Pt resting in no apparent distress in bed.    NOTE:     Pt presented supine with HOB around 30 degrees, EVD placed, and intubated.    Pt was agreeable to perform ROM exercises in bed.   Pt actively executed exercises with minimal verbal cueing.   Pt was able to write with pen and paper  - reported to have a "dry throat". Notified RN.   Pillows were placed bilaterally to support arms and shoulders - mild edema localized around dorsal region of hands.   Wrist restraints were replaced after FMP.     Renee West, Rehab [employer]

## 2014-12-01 NOTE — Progress Notes (Signed)
[facility]     Pharmacy dosing Vancomycin IV empirically for treatment of sepsis     Neurovascular Dx: [facility]    Was on a regimen of 1 gm iv q12h    Day of Therapy: 2    Other Antibiotics: Meropenem    Labs:   Level: 8.9 mcg/dl (8.5 hours post dose)  Bun/Serum Creatinine - 10 mg/dl / 5.640.67 mg/dl  CrCl - 332.9110.5 ml/min  WBC - 7.9  Tmax - 100  I/Os (liter) - 2.4/3.1    Cultures:    negative    Neurovascular management goals:   Fluid goal (IV and TF) 40 ml/hr.  SBP goals: 90-120, 120-150 for antihypertensives.    Vancomycin Kinetic model:  Doesn't fit standard model.  Adjusted Ke 0.137, 1/2 life 5.1 hr, Volume of Distribution 54.8 L.     Plan: Vancomycin trough goal 15-20.  Increase to 1.5 gm iv q8h and maximally concentrated (for CIV).  Ordered Vanco-trough for 6/3.  Pharmacy to follow and will make changes to dose and/or frequency based on clinical status.    Thanks for the consult.

## 2014-12-01 NOTE — Other (Addendum)
NUTRITIONAL ASSESSMENT AND PLAN OF CARE     Renee LeffDorothy R West           59 y.o.           11/28/2014                 1. SAH (subarachnoid hemorrhage) (HCC)           No Cultural, religious or ethnic dietary need identified.     Cultural, religious and ethnic food preferences identified and addressed      Participated in discharge planning/Interdisciplinary rounds   Food allergies:   No          Yes-  ASSESSMENT:   Pt is 182% ideal wt; BMI (calculated): 38.3 kg/m2 (obese classification).  Pt appears well nourished from available data.  A1C in prediabetes range.  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   Tube feeding is meeting ~ 70% estimated energy needs.  Pt appears to be tolerating tube feeding.    INTERVENTIONS/PLAN:   Will monitor tube feeding,  labs and weights.       SUBJECTIVE/OBJECTIVE:   Information obtained from: chart review.    Diet: NPO with Two Cal HN tube feeding currently infusing (via NGT) at 30 ml/hr with Beneprotein, 2 packets BID providing 1540 calories, 84 grams protein, 504 ml free water/d (from tube feeding)  Residual: none per I and O's  Last BM - none recorded.    No data found.    Medications:                 Reviewed     Most Recent POC Glucose:   Recent Labs      12/01/14   0400  11/30/14   0355  11/29/14   0315   GLU  113*  122*  99         Labs:   Lab Results   Component Value Date/Time    HEMOGLOBIN A1C 6.4 11/28/2014 11:45 AM     Lab Results   Component Value Date/Time    SODIUM 152 12/01/2014 04:00 AM    POTASSIUM 3.6 12/01/2014 04:00 AM    CHLORIDE 121 12/01/2014 04:00 AM    CO2 22 12/01/2014 04:00 AM    ANION GAP 9 12/01/2014 04:00 AM    GLUCOSE 113 12/01/2014 04:00 AM    BUN 10 12/01/2014 04:00 AM    CREATININE 0.67 12/01/2014 04:00 AM    CALCIUM 6.6 12/01/2014 04:00 AM    MAGNESIUM 2.5 12/01/2014 04:00 AM    PHOSPHORUS 2.4 12/01/2014 04:00 AM    ALBUMIN 2.8 11/28/2014 11:45 AM        Anthropometrics: IBW : 58.968 kg (130 lb), % IBW (Calculated): 182.3 %, BMI (calculated): 38.3  Wt Readings from Last 1 Encounters:   12/01/14 109.5 kg (241 lb 6.5 oz)      Ht Readings from Last 1 Encounters:   11/29/14 5\' 6"  (1.676 m)       Estimated Nutrition Needs: 2198 Kcals/day, Protein (g): 71 g Fluid (ml): 1800 ml  Based on:             Actual BW              IBW               Adjusted BW        Nutrition Diagnoses:   Obesity r/t excessive energy inake AEB BMI  of 38.3 kg/m2.         Nutrition Interventions:  Tube feeding  Goal:  Tube feeding meets >75% estimated energy and protein needs.  Weight maintenance (+/- 1-2 kg) by 12/09/14.        Nutrition Monitoring and Evaluation           Monitor po intake on meal rounds       Continue inpatient monitoring and intervention       Other:      Nutrition Risk:     High       Moderate      Minimal/Uncompromised    Shyrl Numbers, RD   LR pager [phone]

## 2014-12-01 NOTE — Progress Notes (Signed)
Patient is unable to communicate at this time due to the fact that she is till on total life support.   Chaplain offered prayer and left Spiritual Care brochure. No family seen at time of this visit.  Chaplains will continue to follow and will provide pastoral care on an as needed/requested basis.    Dewaine Oatshaplain Ernie Etheridge   Board Certified Chaplain   Spiritual Care   [phone]

## 2014-12-01 NOTE — Progress Notes (Addendum)
1800-Rec'd verbal order from Dr. Excell SeltzerBaker to extubate pt  -Extubate to 6 lpm NC- coached pt on slow, deep breathing & coughing to bring up secretions    1500-Rec'd pt on vent-Discussed with Dr. Excell SeltzerBaker & he gave ok for wean & SBT  -Placed pt on SBT with PSV=7, peep=5, fio2=30%  -On SBT pt has Vt=500-600 range, RR 18, HR 80, Sat 96%

## 2014-12-01 NOTE — Progress Notes (Addendum)
Problem: Ventilator Management  Goal: *Adequate oxygenation and ventilation  Problem: Ventilator Management  Goal: *Adequate oxygenation and ventilation    Received pt on ventilator. Settings: SIMV 10, 500/60%/+8. CASS tube cleared, ETT patent and secure. Aerosol rx given, pt tolerated passively.  AMBU bag and mask at bedside.     ABG results:  Results for Renee West, Renee R (MRN [id number]) as of 12/01/2014 09:18    Ref. Range 12/01/2014 03:47   pH (POC) Latest Ref Range: 7.35-7.45   7.452 (H)   pCO2 (POC) Latest Ref Range: 35.0-45.0 MMHG 35.8   pO2 (POC) Latest Ref Range: 80-100 MMHG 128 (H)   HCO3 (POC) Latest Ref Range: 22-26 MMOL/L 24.8   sO2 (POC) Latest Ref Range: 92-97 % 99 (H)   Base excess (POC) Latest Units: mmol/L 1   FIO2 (POC) Latest Units: % 60   Patient temp. Latest Units:   99.9   Specimen type (POC) Latest Units:   ARTERIAL   Set Rate Latest Units: bpm 10   Site Latest Units:   DRAWN FROM ARTERI...   Device: Latest Units:   VENT   Total resp. rate Latest Units:   15   Mode Latest Units:   SIMV   Tidal volume Latest Units: ml 500   Volume control plus Latest Units:   YES   PEEP/CPAP (POC) Latest Units: cmH2O 8.0   Pressure support Latest Units: cmH2O 7   Allens test (POC) Latest Units:   N/A   Inspiratory Time Latest Units: sec 0.8     X-ray:  IMPRESSION:     1. Satisfactory position endotracheal tube, feeding tube now extending below the  diaphragm tip not included.     2. Likely hiatal hernia.     3. Suggestion of perihilar edema with interval increased basilar atelectasis  edema or infiltrates. Cannot exclude small effusions.    Plan: Maintain current vent settings for now.     Goal: Maintain optimal oxygenation and ventilation    0843 Vent check done. FiO2 decreased to 50%, PEEP-7.

## 2014-12-01 NOTE — Progress Notes (Signed)
INFECTIOUS DISEASE FOLLOW UP NOTE :-     Admit Date: 11/28/2014    ABX;      Current  Prior    Vanco, Meropenem 5/30-3      ASSESSMENT: -> RECS     SIRS starting 5/30 ? Infectious vs non infectious like central fever  - Source is unclear  - Blcx are negative. UA initially was neg but now with pyuria. CSF g/s neg. CXR appear more fluid overload rather than infiltrates  - Lines and EVD very new  - on pressors   ?? - cont broad spectrum CNS dosed abx for now   - concern for LLL aspiration PNA - sputum cx with GNR and candida [colonizer]  - will streamline with more cx data and clinical response??  - f/u procalcitonin from today    Mild leucocytosis?? - monitor??   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 - Mx per Dr Excell Seltzer and Dr Su Ley??   Thrombocytopenia  ? Drug related vs infection, monitor for now   Resp failure- s/p intubation -5/30?? - per others??   H/o DVT on coumadin  - currently on hold?? 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   Hypothyroiod?? ??           MICROBIOLOGY:   5/30 ucx ntd  5/31 Blcx x2 NTD,           CSF g/s neg          Ucx neg           resp cx - GNR, candida   6/2   CSF cx - IP     LINES AND CATHETERS:   EVD 5/30  PICC 5/30    SUBJECTIVE :     Interval notes reviewed. Low grade fever, remains intubated. Not on pressors anymore, following commands.     OBJECTIVE     BP 106/60 mmHg   Pulse 89   Temp(Src) 99.9 ??F (37.7 ??C)   Resp 15   Ht  (1.676 m)   Wt 109.5 kg (241 lb 6.5 oz)   BMI 38.98 kg/m2   SpO2 99%     Temp (24hrs), Avg:99.7 ??F (37.6 ??C), Min:99 ??F (37.2 ??C), Max:100 ??F (37.8 ??C)    GEN - 59yr old female not in any distress, intubated, not sedated currently  RESP- ctab   CVS- s1s2 normal, no murmur   ABD- soft, NT, BS present  EXT- no edema   SKIN - no rash   NEURO - EVD present with bloody CSF, b/l upper ext and lower ext moving on command , not sedated    Labs: Results:   Chemistry Recent Labs      12/01/14   0400  11/30/14   1421  11/30/14   0355  11/29/14   0315   11/28/14   1145   GLU  113*   --   122*  99   --    --    NA  152*   --   150*  145   --    --    K  3.6  3.3*  3.7  4.6   < >   --    CL  121*   --   121*  113*   --    --    CO2  22   --   20*  21   --    --    BUN  10   --   10  11   --    --    CREA  0.67   --   0.61  0.99   --    --    CA  6.6*   --   6.2*  7.0*   --    --    AGAP  9   --   9  11   --    --    BUCR  15   --   16  11*   --    --    AP   --    --    --    --    --   122*   TP   --    --    --    --    --   7.1   ALB   --    --    --    --    --   2.8*   GLOB   --    --    --    --    --   4.3*   AGRAT   --    --    --    --    --   0.7*    < > = values in this interval not displayed.      CBC w/Diff Recent Labs      12/01/14   0400  12/01/14   0100  11/30/14   1524  11/30/14   0355  11/29/14   0315   WBC  7.9   --    --   8.0  12.7   RBC  2.92*   --    --   3.00*  3.92*   HGB  8.1*  8.1*  8.2*  8.3*  10.9*   HCT  24.6*  24.7*  25.2*  25.9*  33.6*   PLT  116*   --    --   124*  160          RADIOLOGY :    CXR 6/1 - . Satisfactory position endotracheal tube, feeding tube now extending below the  diaphragm tip not included.  2. Likely hiatal hernia.  3. Suggestion of perihilar edema with interval increased basilar atelectasis  edema or infiltrates. Cannot exclude small effusions.    CT head 6/1 - 1. Artifact from interval embolization intradural left vertebral artery.  ??  2. Stable and satisfactory position ventriculostomy catheter with slight   interval decreased size of ventricular system, no hydrocephalus.  ??  3. Stable degree of supratentorial and infratentorial subarachnoid hemorrhage  and intraventricular hemorrhage.  ??  4. Stable serpiginous band of likely mostly subarachnoid hemorrhage but with  possible adjacent cortical hemorrhage involving superior right frontal lobe with  adjacent local edema and mass effect.  ??  5. Interval development of likely new small acute infarct superior right  cerebellum and possible new additional tiny foci of acute infarct left  cerebellum.  ??  6. Small fluid level sphenoid sinus likely related to intubation.    Radford Pax, MD  December 01, 2014  [facility]  [phone]

## 2014-12-02 ENCOUNTER — Inpatient Hospital Stay: Payer: BLUE CROSS/BLUE SHIELD | Primary: Student in an Organized Health Care Education/Training Program

## 2014-12-02 ENCOUNTER — Inpatient Hospital Stay
Admit: 2014-12-02 | Payer: BLUE CROSS/BLUE SHIELD | Primary: Student in an Organized Health Care Education/Training Program

## 2014-12-02 LAB — PROTHROMBIN TIME + INR
INR: 1.2 (ref 0.8–1.2)
Prothrombin time: 14.7 s (ref 11.5–15.2)

## 2014-12-02 LAB — VANCOMYCIN, TROUGH
Reported dose date: 20160603
Reported dose time:: 100
Vancomycin,trough: 18.8 ug/mL (ref 10.0–20.0)

## 2014-12-02 LAB — URINALYSIS W/ RFLX MICROSCOPIC
Bilirubin: NEGATIVE
Glucose: NEGATIVE mg/dL
Ketone: 15 mg/dL — AB
Leukocyte Esterase: NEGATIVE
Nitrites: NEGATIVE
Protein: 30 mg/dL — AB
Specific gravity: 1.016 (ref 1.005–1.030)
Urobilinogen: 1 EU/dL (ref 0.2–1.0)
pH (UA): 7.5 (ref 5.0–8.0)

## 2014-12-02 LAB — PHOSPHORUS: Phosphorus: 2.2 MG/DL — ABNORMAL LOW (ref 2.5–4.9)

## 2014-12-02 LAB — HEPATIC FUNCTION PANEL
A-G Ratio: 0.6 — ABNORMAL LOW (ref 0.8–1.7)
ALT (SGPT): 24 U/L (ref 13–56)
AST (SGOT): 31 U/L (ref 15–37)
Albumin: 2.5 g/dL — ABNORMAL LOW (ref 3.4–5.0)
Alk. phosphatase: 107 U/L (ref 45–117)
Bilirubin, direct: 0.3 MG/DL — ABNORMAL HIGH (ref 0.0–0.2)
Bilirubin, total: 0.8 MG/DL (ref 0.2–1.0)
Globulin: 4.4 g/dL — ABNORMAL HIGH (ref 2.0–4.0)
Protein, total: 6.9 g/dL (ref 6.4–8.2)

## 2014-12-02 LAB — METABOLIC PANEL, BASIC
Anion gap: 10 mmol/L (ref 3.0–18)
BUN/Creatinine ratio: 14 (ref 12–20)
BUN: 10 MG/DL (ref 7.0–18)
CO2: 23 mmol/L (ref 21–32)
Calcium: 8.1 MG/DL — ABNORMAL LOW (ref 8.5–10.1)
Chloride: 115 mmol/L — ABNORMAL HIGH (ref 100–108)
Creatinine: 0.69 MG/DL (ref 0.6–1.3)
GFR est AA: >60 mL/min/{1.73_m2} (ref >60)
GFR est non-AA: >60 mL/min/{1.73_m2} (ref >60)
Glucose: 93 mg/dL (ref 74–99)
Potassium: 4.3 mmol/L (ref 3.5–5.5)
Sodium: 148 mmol/L — ABNORMAL HIGH (ref 136–145)

## 2014-12-02 LAB — TYPE & SCREEN
ABO/Rh(D): AB POS
Antibody screen: NEGATIVE
Status of unit: TRANSFUSED
Unit division: 0

## 2014-12-02 LAB — CELL COUNT, CSF
CSF LYMPH: 13 %
CSF MONO: 4 %
CSF Neutrophils: 83 % — ABNORMAL HIGH (ref 0–6)
CSF WBCs: 5 /mm3 — ABNORMAL HIGH (ref <5)

## 2014-12-02 LAB — MAGNESIUM: Magnesium: 2.3 mg/dL (ref 1.8–2.4)

## 2014-12-02 LAB — CBC W/O DIFF
HCT: 30.7 % — ABNORMAL LOW (ref 35.0–45.0)
HGB: 10.3 g/dL — ABNORMAL LOW (ref 12.0–16.0)
MCH: 28.5 PG (ref 24.0–34.0)
MCHC: 33.6 g/dL (ref 31.0–37.0)
MCV: 85 FL (ref 74.0–97.0)
MPV: 10.6 FL (ref 9.2–11.8)
PLATELET: 128 10*3/uL — ABNORMAL LOW (ref 135–420)
RBC: 3.61 M/uL — ABNORMAL LOW (ref 4.20–5.30)
RDW: 14.7 % — ABNORMAL HIGH (ref 11.6–14.5)
WBC: 12.6 10*3/uL (ref 4.6–13.2)

## 2014-12-02 LAB — GLUCOSE, CSF: Glucose,CSF: 74 MG/DL — ABNORMAL HIGH (ref 40–70)

## 2014-12-02 LAB — AMYLASE: Amylase: 289 U/L — ABNORMAL HIGH (ref 25–115)

## 2014-12-02 LAB — LIPASE: Lipase: 2008 U/L — ABNORMAL HIGH (ref 73–393)

## 2014-12-02 LAB — URINE MICROSCOPIC ONLY
Bacteria: NEGATIVE /hpf
Epithelial cells: NEGATIVE /lpf (ref 0–5)
RBC: 4 /hpf (ref 0–5)
WBC: 0 /hpf (ref 0–4)

## 2014-12-02 LAB — CULTURE, RESPIRATORY/SPUTUM/BRONCH W GRAM STAIN: GRAM STAIN: NONE SEEN

## 2014-12-02 LAB — GLUCOSE, POC
Glucose (POC): 95 mg/dL (ref 70–110)
Glucose (POC): 124 mg/dL — ABNORMAL HIGH (ref 70–110)

## 2014-12-02 LAB — PROCALCITONIN: Procalcitonin: 0.96 ng/mL — ABNORMAL HIGH (ref 0.00–0.50)

## 2014-12-02 LAB — AMMONIA: Ammonia, plasma: 32 umol/L (ref 11–32)

## 2014-12-02 LAB — PTT: aPTT: 31.3 s (ref 24.6–37.7)

## 2014-12-02 LAB — CALCIUM, IONIZED: Ionized Calcium: 1.12 MMOL/L (ref 1.12–1.32)

## 2014-12-02 LAB — TYPE AND SCREEN
ABO/Rh: AB POS
Antibody Screen: NEGATIVE
Status: TRANSFUSED
Unit Divison: 0

## 2014-12-02 MED ORDER — SODIUM CHLORIDE 0.9 % INJECTION
20 mg/2 mL | Freq: Two times a day (BID) | INTRAMUSCULAR | Status: DC
Start: 2014-12-02 — End: 2014-12-02
  Administered 2014-12-02: 13:00:00 via INTRAVENOUS

## 2014-12-02 MED ORDER — LABETALOL 5 MG/ML IV SOLN
5 mg/mL | INTRAVENOUS | Status: AC
Start: 2014-12-02 — End: 2014-12-02
  Administered 2014-12-02: 13:00:00

## 2014-12-02 MED ORDER — D5-NS WITH POTASSIUM CHLORIDE 20 MEQ/L IV
20 mEq/L | INTRAVENOUS | Status: DC
Start: 2014-12-02 — End: 2014-12-03
  Administered 2014-12-02 (×2): via INTRAVENOUS

## 2014-12-02 MED ORDER — SODIUM CHLORIDE 0.9 % IV
100 mg iron/5 mL | INTRAVENOUS | Status: DC
Start: 2014-12-02 — End: 2014-12-06
  Administered 2014-12-02 – 2014-12-05 (×4): via INTRAVENOUS

## 2014-12-02 MED ORDER — POTASSIUM CHLORIDE 20 MEQ/50 ML IV PIGGY BACK
20 mEq/50 mL | INTRAVENOUS | Status: AC
Start: 2014-12-02 — End: 2014-12-01
  Administered 2014-12-02: 01:00:00 via INTRAVENOUS

## 2014-12-02 MED ORDER — NICARDIPINE 2.5 MG/ML IV
2510 mg/10 mL | INTRAVENOUS | Status: DC
Start: 2014-12-02 — End: 2014-12-02

## 2014-12-02 MED ORDER — PHARMACY VANCOMYCIN NOTE
Freq: Once | Status: AC
Start: 2014-12-02 — End: 2014-12-03
  Administered 2014-12-03: 12:00:00

## 2014-12-02 MED ORDER — LABETALOL 5 MG/ML IV SOLN
5 mg/mL | INTRAVENOUS | Status: DC | PRN
Start: 2014-12-02 — End: 2014-12-02
  Administered 2014-12-02: 13:00:00 via INTRAVENOUS

## 2014-12-02 MED ORDER — POTASSIUM CHLORIDE 20 MEQ/50 ML IV PIGGY BACK
20 mEq/50 mL | Freq: Once | INTRAVENOUS | Status: AC
Start: 2014-12-02 — End: 2014-12-02

## 2014-12-02 MED FILL — VAZCULEP 10 MG/ML INJECTION SOLUTION: 10 mg/mL | INTRAMUSCULAR | Qty: 9750

## 2014-12-02 MED FILL — IPRATROPIUM-ALBUTEROL 2.5 MG-0.5 MG/3 ML NEB SOLUTION: 2.5 mg-0.5 mg/3 ml | RESPIRATORY_TRACT | Qty: 3

## 2014-12-02 MED FILL — TYLENOL 325 MG TABLET: 325 mg | ORAL | Qty: 2

## 2014-12-02 MED FILL — SODIUM CHLORIDE 0.9 % IV: INTRAVENOUS | Qty: 250

## 2014-12-02 MED FILL — VENOFER 100 MG IRON/5 ML INTRAVENOUS SOLUTION: 100 mg iron/5 mL | INTRAVENOUS | Qty: 10

## 2014-12-02 MED FILL — FOLIC ACID 5 MG/ML IJ SOLN: 5 mg/mL | INTRAMUSCULAR | Qty: 0.2

## 2014-12-02 MED FILL — NIMODIPINE 30 MG CAP: 30 mg | ORAL | Qty: 1

## 2014-12-02 MED FILL — SODIUM CHLORIDE 0.9 % IV: INTRAVENOUS | Qty: 650

## 2014-12-02 MED FILL — D5-NS WITH POTASSIUM CHLORIDE 20 MEQ/L IV: 20 mEq/L | INTRAVENOUS | Qty: 1000

## 2014-12-02 MED FILL — SODIUM CHLORIDE 0.9 % IV PIGGY BACK: INTRAVENOUS | Qty: 50

## 2014-12-02 MED FILL — ASPIRIN 81 MG CHEWABLE TAB: 81 mg | ORAL | Qty: 1

## 2014-12-02 MED FILL — SODIUM CHLORIDE 0.9 % INJECTION: INTRAMUSCULAR | Qty: 10

## 2014-12-02 MED FILL — PHARMACY VANCOMYCIN NOTE: Qty: 1

## 2014-12-02 MED FILL — SENNA PLUS 8.6 MG-50 MG TABLET: ORAL | Qty: 2

## 2014-12-02 MED FILL — DIPRIVAN 10 MG/ML INTRAVENOUS EMULSION: 10 mg/mL | INTRAVENOUS | Qty: 3635.65

## 2014-12-02 MED FILL — ACEPHEN 650 MG RECTAL SUPPOSITORY: 650 mg | RECTAL | Qty: 1

## 2014-12-02 MED FILL — VANCOMYCIN 10 GRAM IV SOLR: 10 gram | INTRAVENOUS | Qty: 1500

## 2014-12-02 MED FILL — POTASSIUM CHLORIDE 20 MEQ/50 ML IV PIGGY BACK: 20 mEq/50 mL | INTRAVENOUS | Qty: 50

## 2014-12-02 MED FILL — LABETALOL 5 MG/ML IV SOLN: 5 mg/mL | INTRAVENOUS | Qty: 20

## 2014-12-02 MED FILL — MAGNESIUM SULFATE 2 GRAM/50 ML IVPB: 2 gram/50 mL (4 %) | INTRAVENOUS | Qty: 50

## 2014-12-02 MED FILL — HEPARIN (PORCINE) 1,000 UNIT/ML IJ SOLN: 1000 unit/mL | INTRAMUSCULAR | Qty: 4

## 2014-12-02 NOTE — Progress Notes (Addendum)
2000> Patient assessed. Patient is lying in bed, resting. Patient is A&O x 4. Patient denies having any pain or discomfort at this time. EVD at 15 above and open. No further changes or complaints. Call bell within reach. Bed is locked in low position. Side rails up x 3. Will continue to monitor.  2120> Patient temperature 38.9 bladder. Tylenol given. Cooling blanket applied. Ice packs applied. Will continue to monitor.   0000> No changes.  0400> No changes in patient's condition. Patient is sleeping. No signs of pain or distress. Will continue to monitor.  0700> Bedside and Verbal shift change report given to Karie MainlandAli, Charity fundraiserN (oncoming nurse) by Donald Sivalena Yermak, RN (offgoing nurse). Report included the following information SBAR, Kardex, Intake/Output, MAR and Recent Results.

## 2014-12-02 NOTE — Progress Notes (Signed)
Patient completed ROM as follows.  RLE X 10 (dorsi/plantarflexion, knee int/external rotation, knee flex/extension, hip ab/adduction, hip flex/extension)   LLE X 10 (dorsi/plantarflexion, knee int/external rotation, knee flex/extension, hip ab/adduction, hip flex/extension)      Pt participation was:  (X) AROM/ AAROM  () PROM    Pain Level Before FMP: 0  Pain Level After FMP: 0  Non Verbal Pain Scale :     (X) Alerted Nursing.  (X) Call bell within patient reach.  (X) Pt resting in no apparent distress in bed.    NOTE:   Pt presented awake and supine in bed with EVD placed. Pt has been extubated since last FMP. ??  AROM exercises were performed with minimal verbal cueing.?? ??  Pt requested ice chips - RN made aware.  Curnel Cassie FreerHall Jr, Rehab [employer]

## 2014-12-02 NOTE — Other (Signed)
NUTRITIONAL ASSESSMENT AND PLAN OF CARE     Renee West           59 y.o.           11/28/2014                 1. SAH (subarachnoid hemorrhage) (HCC)           No Cultural, religious or ethnic dietary need identified.     Cultural, religious and ethnic food preferences identified and addressed      Participated in discharge planning/Interdisciplinary rounds   Food allergies:   No          Yes-  ASSESSMENT:   Pt is 182% ideal wt; BMI (calculated): 38.3 kg/m2 (obese classification).  Pt appears well nourished from available data.  A1C in prediabetes range.  Pt is  s/p embolization of LVA pseudoaneurysm (11/30/14) and was previously intubated with tube feeding via NGT.    Pt now extubated, SLP note reviewed and orders written for pureed diet, cardiac with honey thick liquids for dysphagia.    INTERVENTIONS/PLAN:   Will monitor po intake, labs and weights.       SUBJECTIVE/OBJECTIVE:   Information obtained from: chart review, RN.    Diet: Orders written for pureed diet, honey thick liquids, cardiac per SLP assessment;  (pt was previously NPO with Two Cal HN tube feeding via NGT at 30 ml/hr with Beneprotein, 2 packets BID)       No data found.    Medications:                 Reviewed     Most Recent POC Glucose:   Recent Labs      12/02/14   0405  12/01/14   0400  11/30/14   0355   GLU  93  113*  122*         Labs:   Lab Results   Component Value Date/Time    HEMOGLOBIN A1C 6.4 11/28/2014 11:45 AM     Lab Results   Component Value Date/Time    SODIUM 148 12/02/2014 04:05 AM    POTASSIUM 4.3 12/02/2014 04:05 AM    CHLORIDE 115 12/02/2014 04:05 AM    CO2 23 12/02/2014 04:05 AM    ANION GAP 10 12/02/2014 04:05 AM    GLUCOSE 93 12/02/2014 04:05 AM    BUN 10 12/02/2014 04:05 AM    CREATININE 0.69 12/02/2014 04:05 AM    CALCIUM 8.1 12/02/2014 04:05 AM    MAGNESIUM 2.3 12/02/2014 04:05 AM    PHOSPHORUS 2.2 12/02/2014 04:05 AM    ALBUMIN 2.5 12/02/2014 04:05 AM        Anthropometrics: IBW : 58.968 kg (130 lb), % IBW (Calculated): 182.3 %, BMI (calculated): 38.3  Wt Readings from Last 1 Encounters:   12/01/14 109.5 kg (241 lb 6.5 oz)      Ht Readings from Last 1 Encounters:   11/29/14  (1.676 m)       Estimated Nutrition Needs: 2198 Kcals/day, Protein (g): 71 g Fluid (ml): 1800 ml  Based on:             Actual BW              IBW               Adjusted BW        Nutrition Diagnoses:   Obesity r/t excessive energy inake AEB BMI of 38.3 kg/m2.  Difficulty swallowing  r/t dysphagia AEB pureed diet/HTL.         Nutrition Interventions:  Texture alteration - pureed with honey thick liquids  Goal:  Po intake meets >75% estimated energy and protein needs.  Weight maintenance (+/- 1-2 kg) by 12/09/14.        Nutrition Monitoring and Evaluation           Monitor po intake on meal rounds       Continue inpatient monitoring and intervention       Other:      Nutrition Risk:     High       Moderate      Minimal/Uncompromised    Renee West, RD   LR pager [phone]

## 2014-12-02 NOTE — Progress Notes (Signed)
INFECTIOUS DISEASE FOLLOW UP NOTE :-     Admit Date: 11/28/2014     Will plan to check back on Monday 12/05/2014. Dr.Schwab is on call for ID this weekend and can be  reached at [phone] if needed.    ABX;      Current  Prior    Vanco, Meropenem 5/30-2      ASSESSMENT: -> RECS     Fever - since admission 5/30  - ?etiology: ?SIRS suspect from [facility], w/ elevated lipase 2008 ?pancreatitis  - blcx 5/31, urcx, CSF cx NGTD  - pyuria but urcx 5/30 NG, no pneumonia  -  CXR appear more fluid overload rather than infiltrates  - Lines and EVD very new  - on pressors  -> cont broad spectrum CNS dosed abx for now   -> will streamline with more cx data and clinical response??  - check procalcitonin in am    Mild leukocytosis??  - stable 12.6 today 6/3 -> monitor??   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   Resp failure- s/p intubation -5/30?? - per others??   H/o DVT on coumadin  - currently on hold?? 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   Hypothyroiod?? ??           MICROBIOLOGY:   5/30 ucx ntd  5/31 Blcx x2 NTD,           CSF g/s neg          Ucx neg           resp cx -IP     LINES AND CATHETERS:   EVD 5/30  PICC 5/30    SUBJECTIVE :     Interval notes reviewed.Fever persists but mental status good.  Awake, alert, conversing appropriately.  Denies headache.  Says she has cough.  No throat pain.     OBJECTIVE     BP 181/83 mmHg   Pulse 88   Temp(Src) 102.4 ??F (39.1 ??C)   Resp 24   Ht  (1.676 m)   Wt 109.5 kg (241 lb 6.5 oz)   BMI 38.98 kg/m2   SpO2 95%     Temp (24hrs), Avg:101.5 ??F (38.6 ??C), Min:100 ??F (37.8 ??C), Max:102.4 ??F (39.1 ??C)    GEN - pleasant 59yr old [place] American female awake and conversing, no tin respiratory distress (extubated yesterday)  HEENT: bilateral conjunctival injection  RESP- no crackles or wheezes  CVS- s1s2 normal, no murmur   ABD- soft, NT, BS present  EXT- no edema   SKIN - no rash   NEURO - EVD w/ sero sanguinous drainage, awake, answers appropriately   Labs: Results:   Chemistry Recent Labs      12/02/14   0405  12/01/14   1740  12/01/14   1220  12/01/14   0400   11/30/14   0355   GLU  93   --    --   113*   --   122*   NA  148*   --    --   152*   --   150*   K  4.3  3.7  3.9  3.6   < >  3.7   CL  115*   --    --   121*   --   121*   CO2  23   --    --   22   --   20*   BUN  10   --    --   10   --   10   CREA  0.69   --    --   0.67   --   0.61   CA  8.1*   --    --   6.6*   --   6.2*   AGAP  10   --    --   9   --   9   BUCR  14   --    --   15   --   16   AP  107   --   87   --    --    --    TP  6.9   --   6.2*   --    --    --    ALB  2.5*   --   2.2*   --    --    --    GLOB  4.4*   --   4.0   --    --    --    AGRAT  0.6*   --   0.6*   --    --    --     < > = values in this interval not displayed.      CBC w/Diff Recent Labs      12/02/14   0405  12/01/14   1740  12/01/14   0400   11/30/14   0355   WBC  12.6   --   7.9   --   8.0   RBC  3.61*   --   2.92*   --   3.00*   HGB  10.3*  9.7*  8.1*   < >  8.3*   HCT  30.7*  28.9*  24.6*   < >  25.9*   PLT  128*   --   116*   --   124*    < > = values in this interval not displayed.          RADIOLOGY :    CXR 6/1 - pending      Nolon LennertODRIGO L Sareena Odeh, MD  December 02, 2014  [facility]  [phone]

## 2014-12-02 NOTE — Progress Notes (Signed)
Patient completed ROM as follows.  RUE X 10 (Wrist flex/extension, elbow flex/extension, shoulder int/external rotation, shoulder ab/adduction, counter/clockwise arm circles)  LUE X 10 (Wrist flex/extension, elbow flex/extension,  shoulder int/external rotation, shoulder ab/adduction, counter/clockwise arm circles)    Pt participation was:  (X) AROM/ AAROM  () PROM    Pain Level Before FMP: 0  Pain Level After FMP: 0  Non Verbal Pain Scale :     (X) Alerted Nursing.  (X) Call bell within patient reach.  (X) Pt resting in no apparent distress in bed.    NOTE:   Pt presented awake and supine in bed with EVD placed. Pt has been extubated since last FMP.   AROM exercises were performed with minimal verbal cueing.    Pt requested ice chips - RN made aware.      Curnel Cassie FreerHall Jr, Rehab [employer]

## 2014-12-02 NOTE — Progress Notes (Addendum)
Neurovascular Progress Note      Patient: HADJA HARRAL MRN: [id number]  SSN: [id number]    Date of Birth: Mar 29, 1956  Age: 59 y.o.  Sex: female      Events  Patient tolerating being extubated.    EVD 15 cm.    SBP parameters increased.     Febrile. Cultures repeated.     Vital Signs  Patient Vitals for the past 24 hrs:   Temp Pulse Resp BP SpO2   12/02/14 1056 (!) 102.4 ??F (39.1 ??C) - - - -   12/02/14 1053 - 79 - 182/88 mmHg -   12/02/14 0945 - 89 25 - 94 %   12/02/14 0935 - - - - 93 %   12/02/14 0930 - 89 23 182/87 mmHg 92 %   12/02/14 0929 - 88 - 181/88 mmHg -   12/02/14 0915 - 84 22 188/90 mmHg 92 %   12/02/14 0900 - 85 24 169/82 mmHg 91 %   12/02/14 0845 - 94 24 - 93 %   12/02/14 0830 - 81 23 (!) 190/110 mmHg 96 %   12/02/14 0815 - 86 27 - 94 %   12/02/14 0800 (!) 101.8 ??F (38.8 ??C) 79 26 166/78 mmHg 93 %   12/02/14 0745 - 88 25 - 96 %   12/02/14 0740 - 79 - 175/84 mmHg -   12/02/14 0730 - 84 23 158/74 mmHg 94 %   12/02/14 0720 - 81 25 - 95 %   12/02/14 0715 - 80 24 - 96 %   12/02/14 0700 - 89 23 158/75 mmHg 94 %   12/02/14 0630 - - - 152/76 mmHg -   12/02/14 0614 - - - - 96 %   12/02/14 0600 (!) 101.5 ??F (38.6 ??C) 92 21 150/74 mmHg 95 %   12/02/14 0530 - - - 151/69 mmHg -   12/02/14 0510 - 78 23 - 95 %   12/02/14 0500 - 86 21 160/75 mmHg 95 %   12/02/14 0430 - - - 151/76 mmHg -   12/02/14 0400 (!) 101.5 ??F (38.6 ??C) 86 23 148/76 mmHg 93 %   12/02/14 0330 - - - 135/61 mmHg -   12/02/14 0300 (!) 101.5 ??F (38.6 ??C) 76 27 151/72 mmHg 95 %   12/02/14 0230 - - - 145/68 mmHg -   12/02/14 0200 (!) 101.7 ??F (38.7 ??C) 86 22 148/69 mmHg 95 %   12/02/14 0130 - 98 21 148/73 mmHg 92 %   12/02/14 0100 (!) 101.8 ??F (38.8 ??C) (!) 106 19 (!) 156/104 mmHg 95 %   12/02/14 0035 - - - - 95 %   12/02/14 0000 (!) 102 ??F (38.9 ??C) 86 23 - 94 %   12/01/14 2300 - 83 22 143/65 mmHg 93 %   12/01/14 2200 - 83 22 149/71 mmHg 93 %   12/01/14 2100 - 74 22 148/71 mmHg 96 %   12/01/14 2022 - - - - 93 %    12/01/14 2000 (!) 101.7 ??F (38.7 ??C) 81 22 146/66 mmHg 96 %   12/01/14 1908 - 86 - 131/69 mmHg -   12/01/14 1900 (!) 101.5 ??F (38.6 ??C) 84 20 131/69 mmHg 96 %   12/01/14 1845 - 91 19 - 96 %   12/01/14 1837 - - - - 95 %   12/01/14 1834 - 81 - - 95 %   12/01/14 1830 - 83 19 129/67 mmHg 95 %   12/01/14 1815 -  98 20 - 93 %   12/01/14 1800 - 74 - 131/75 mmHg 94 %   12/01/14 1745 - 93 - - 95 %   12/01/14 1730 - 75 - 132/66 mmHg 97 %   12/01/14 1727 - 75 - 130/65 mmHg -   12/01/14 1715 - 94 - - 96 %   12/01/14 1700 (!) 101.1 ??F (38.4 ??C) 83 18 130/65 mmHg 96 %   12/01/14 1650 - - - - 95 %   12/01/14 1645 - 81 - - 96 %   12/01/14 1630 - 80 - 123/63 mmHg 96 %   12/01/14 1615 - 81 - - 96 %   12/01/14 1600 (!) 100.8 ??F (38.2 ??C) 78 20 119/62 mmHg 96 %   12/01/14 1545 - 75 - - 95 %   12/01/14 1530 - 89 - 127/67 mmHg 100 %   12/01/14 1515 - 77 - - 96 %   12/01/14 1510 - 80 18 118/78 mmHg 96 %   12/01/14 1500 - 81 - 117/64 mmHg 98 %   12/01/14 1445 - 77 - - 98 %   12/01/14 1430 - 79 - 118/62 mmHg 98 %   12/01/14 1415 - 83 - - 98 %   12/01/14 1400 - 81 - 116/64 mmHg 98 %   12/01/14 1345 - 77 - - 98 %   12/01/14 1330 - 79 - 114/60 mmHg 97 %   12/01/14 1315 - 81 - - 98 %   12/01/14 1300 - 82 - 113/59 mmHg 98 %   12/01/14 1257 - 83 - 117/59 mmHg -   12/01/14 1245 - 82 - - 97 %   12/01/14 1230 - 84 - 117/59 mmHg 100 %   12/01/14 1215 - 86 - - 99 %   12/01/14 1200 100 ??F (37.8 ??C) 86 18 112/63 mmHg 99 %   12/01/14 1130 - 79 - 105/57 mmHg 99 %   12/01/14 1127 - 83 18 - 99 %   12/01/14 1115 - 80 - 112/58 mmHg -       NIHSS Flow Sheet  Total: 0 (12/02/14 1000)     ICP:  10-11    Intake and Output    Intake/Output Summary (Last 24 hours) at 12/02/14 1100  Last data filed at 12/02/14 1006   Gross per 24 hour   Intake 1339.67 ml   Output   4100 ml   Net -2760.33 ml     CSF drainage:  205 ml over 24 hours    Data    Recent Results (from the past 24 hour(s))   HEPATIC FUNCTION PANEL    Collection Time: 12/01/14 12:20 PM    Result Value Ref Range    Protein, total 6.2 (L) 6.4 - 8.2 g/dL    Albumin 2.2 (L) 3.4 - 5.0 g/dL    Globulin 4.0 2.0 - 4.0 g/dL    A-G Ratio 0.6 (L) 0.8 - 1.7      Bilirubin, total 0.7 0.2 - 1.0 MG/DL    Bilirubin, direct 0.1 0.0 - 0.2 MG/DL    Alk. phosphatase 87 45 - 117 U/L    AST 37 15 - 37 U/L    ALT 22 13 - 56 U/L   AMYLASE    Collection Time: 12/01/14 12:20 PM   Result Value Ref Range    Amylase 219 (H) 25 - 115 U/L   LIPASE    Collection Time: 12/01/14 12:20 PM   Result Value Ref Range  Lipase 999 (H) 73 - 393 U/L   AMMONIA    Collection Time: 12/01/14 12:20 PM   Result Value Ref Range    Ammonia 31 11 - 32 UMOL/L   POTASSIUM    Collection Time: 12/01/14 12:20 PM   Result Value Ref Range    Potassium 3.9 3.5 - 5.5 mmol/L   PHOSPHORUS    Collection Time: 12/01/14 12:20 PM   Result Value Ref Range    Phosphorus 2.8 2.5 - 4.9 MG/DL   GLUCOSE, POC    Collection Time: 12/01/14 12:41 PM   Result Value Ref Range    Glucose (POC) 101 70 - 110 mg/dL   HGB & HCT    Collection Time: 12/01/14  5:40 PM   Result Value Ref Range    HGB 9.7 (L) 12.0 - 16.0 g/dL    HCT 28.9 (L) 35.0 - 45.0 %   POTASSIUM    Collection Time: 12/01/14  5:40 PM   Result Value Ref Range    Potassium 3.7 3.5 - 5.5 mmol/L   CELL COUNT, CSF    Collection Time: 12/02/14  4:00 AM   Result Value Ref Range    CSF TUBE NO. BAGGED SPECIMEN     CSF COLOR YELLOW     CSF APPEARANCE BLOODY      CSF RBCS  0 /cu mm     SPECIMEN GROSSLY BLOODY. RBC'S TOO NUMEROUS TO COUNT.    CSF WBCS 5 (H) <5 /cu mm    CSF SEGS 83 (H) 0 - 6 %    CSF LYMPH 13 %    CSF MONO 4 %   GLUCOSE, CSF    Collection Time: 12/02/14  4:00 AM   Result Value Ref Range    Tube No. BAGGED SPECIMEN     Glucose,CSF 74 (H) 40 - 70 MG/DL   CULTURE, CSF W GRAM STAIN    Collection Time: 12/02/14  4:00 AM   Result Value Ref Range    Special Requests: BAGGED SPECIMEN     GRAM STAIN FEW  WBC'S        GRAM STAIN NO ORGANISMS SEEN      Culture result: PENDING    CBC W/O DIFF     Collection Time: 12/02/14  4:05 AM   Result Value Ref Range    WBC 12.6 4.6 - 13.2 K/uL    RBC 3.61 (L) 4.20 - 5.30 M/uL    HGB 10.3 (L) 12.0 - 16.0 g/dL    HCT 30.7 (L) 35.0 - 45.0 %    MCV 85.0 74.0 - 97.0 FL    MCH 28.5 24.0 - 34.0 PG    MCHC 33.6 31.0 - 37.0 g/dL    RDW 14.7 (H) 11.6 - 14.5 %    PLATELET 128 (L) 135 - 420 K/uL    MPV 10.6 9.2 - 11.8 FL   PROTHROMBIN TIME + INR    Collection Time: 12/02/14  4:05 AM   Result Value Ref Range    Prothrombin time 14.7 11.5 - 15.2 sec    INR 1.2 0.8 - 1.2     PTT    Collection Time: 12/02/14  4:05 AM   Result Value Ref Range    aPTT 31.3 24.6 - 51.7 SEC   METABOLIC PANEL, BASIC    Collection Time: 12/02/14  4:05 AM   Result Value Ref Range    Sodium 148 (H) 136 - 145 mmol/L    Potassium 4.3 3.5 - 5.5 mmol/L  Chloride 115 (H) 100 - 108 mmol/L    CO2 23 21 - 32 mmol/L    Anion gap 10 3.0 - 18 mmol/L    Glucose 93 74 - 99 mg/dL    BUN 10 7.0 - 18 MG/DL    Creatinine 0.69 0.6 - 1.3 MG/DL    BUN/Creatinine ratio 14 12 - 20      GFR est AA >60 >60 ml/min/1.33m    GFR est non-AA >60 >60 ml/min/1.735m   Calcium 8.1 (L) 8.5 - 10.1 MG/DL   CALCIUM, IONIZED    Collection Time: 12/02/14  4:05 AM   Result Value Ref Range    Ionized Calcium 1.12 1.12 - 1.32 MMOL/L   MAGNESIUM    Collection Time: 12/02/14  4:05 AM   Result Value Ref Range    Magnesium 2.3 1.8 - 2.4 mg/dL   PHOSPHORUS    Collection Time: 12/02/14  4:05 AM   Result Value Ref Range    Phosphorus 2.2 (L) 2.5 - 4.9 MG/DL   AMMONIA    Collection Time: 12/02/14  4:05 AM   Result Value Ref Range    Ammonia 32 11 - 32 UMOL/L   AMYLASE    Collection Time: 12/02/14  4:05 AM   Result Value Ref Range    Amylase 289 (H) 25 - 115 U/L   LIPASE    Collection Time: 12/02/14  4:05 AM   Result Value Ref Range    Lipase 2008 (H) 73 - 393 U/L   HEPATIC FUNCTION PANEL    Collection Time: 12/02/14  4:05 AM   Result Value Ref Range    Protein, total 6.9 6.4 - 8.2 g/dL    Albumin 2.5 (L) 3.4 - 5.0 g/dL     Globulin 4.4 (H) 2.0 - 4.0 g/dL    A-G Ratio 0.6 (L) 0.8 - 1.7      Bilirubin, total 0.8 0.2 - 1.0 MG/DL    Bilirubin, direct 0.3 (H) 0.0 - 0.2 MG/DL    Alk. phosphatase 107 45 - 117 U/L    AST 31 15 - 37 U/L    ALT 24 13 - 56 U/L   URINALYSIS W/ RFLX MICROSCOPIC    Collection Time: 12/02/14  8:20 AM   Result Value Ref Range    Color YELLOW      Appearance CLEAR      Specific gravity 1.016 1.005 - 1.030      pH (UA) 7.5 5.0 - 8.0      Protein 30 (A) NEG mg/dL    Glucose NEGATIVE  NEG mg/dL    Ketone 15 (A) NEG mg/dL    Bilirubin NEGATIVE  NEG      Blood SMALL (A) NEG      Urobilinogen 1.0 0.2 - 1.0 EU/dL    Nitrites NEGATIVE  NEG      Leukocyte Esterase NEGATIVE  NEG     CULTURE, BLOOD    Collection Time: 12/02/14  8:20 AM   Result Value Ref Range    Special Requests: PICC     Culture result: PENDING    CULTURE, BLOOD    Collection Time: 12/02/14  8:20 AM   Result Value Ref Range    Special Requests: PERIPHERAL      Culture result: PENDING    VANCOMYCIN, TROUGH    Collection Time: 12/02/14  8:20 AM   Result Value Ref Range    Vancomycin,trough 18.8 10.0 - 20.0 ug/mL    Reported dose date: [id number]  Reported dose time: 100      Reported dose: 1.5G UNITS   URINE MICROSCOPIC ONLY    Collection Time: 12/02/14  8:20 AM   Result Value Ref Range    WBC 0 to 3 0 - 4 /hpf    RBC 4 to 6 0 - 5 /hpf    Epithelial cells NEGATIVE  0 - 5 /lpf    Bacteria NEGATIVE  NEG /hpf        Physical Exam  General: Supine, HOB; 30 degrees.  Neuro: Patient opens eyes to verbal stimulation. Patient is not oriented to place and time. She does know that she had bleeding in the brain. Bilateral proptosis. Pupils 2 and reactive. She does follow simple fingers by holding fingers up when asked to do so. She lifts all extremities off bed spontaneously.    Wound: Right frontal EVD in place with serosanguinous.   Pulmonary: Decrease breath sounds throughout.  CV: S1 and S2 heard.  Abd: Obese, NT, ND, NG. DHT in place.   Extremities: Distal pulses 1+.      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     1.) Neurologic: Neuro exam stable. EVD 15 cm. Aspirin therapy started to help prevent embolic formation along coils. Repeat cerebral angiography likely Monday. CT for neuro changes. SBP goal parameters increased to < 180 mmHg sustained to notify neurovascular team.    Nimodipine for 21 days; dose split every 2 hours. PT/OT at bed level. ST to assess swallow. Patient may need a peg tube placement. This has been discussed with patient's family. Patient is unable to go for MBS. Patient will need to have peg tube placed the early part of next week in relation to patient may need ventriculoperitoneal shunt.      2.) Cardiovascular: SBP goal parameters increased to < 180 mmHg sustained to notify neurovascular team. TTE no RWMA. Control HR first before peripheral antihypertensives.     3.) Pulmonary: Patient tolerating being off vent. Nebs standing and as needed.    4.) Endocrine: Insulin scale.     5.) Hematology: Hgb at goal of 10 after transfusion. Keep greater than 10 for risk of cerebral ischemia and myocardial ischemia. LE [place] no acute DVT. SCDs. No pharmacologic DVT prophylaxis or treatment for now.  Iron deficiency anemia. Replete iron iv when hemodynamics more stable.     6.) Fluids, Electrolytes, and Nutrition: Normotonic hydration; may need hypertonic if cerebral edema is problematic. Vitamins given.     7.) Gastrointestinal: Lipase: 999, 06/02 and today 2008 and amylase: 219, 06/02 and today 289. RUQ ultrasound to assess gallbladder about noontime today. Antiemtics and PPI. Hemoccult stools as iron deficiency anemia while on warfarin.    8.) LTD: Foley in place. PICC placed.  DHT.    9.) ID: Fever of unclear etiology. Mini BAL revealed rare gram - rods and  rare candida albicans. Patient re cultured. Could be central but remain vigilant for infection.  Broad spectrum antibiotics to penetrate CNS with EVD in place until culture data can guide adjustment.  ID adjusting antibiotic as cultures are resulted. Antipyretics and external cooling systems if needed to maintain euthermia. Repeat studies if temp > 100.5 every 24 hours.     30 minutes of neurocritical care time was provided independent of procedures.      Creta Levin, NP

## 2014-12-02 NOTE — Progress Notes (Addendum)
Patient is extubated.  Spoke to her brother, Dereck LeepJohn (Roy) Purudd [phone]. He stated the patient was visiting from [place] when she became ill. He states the patient was independent prior to admission. She lived alone and has a PCP, her brother does not know her PCP's name. The patient lives in [place], [place], near her brother.  Her daughter and Danna HeftyOK, Grace  lives in [place] , [place], [place] [id number] [phone].  The patients brother has stated he would like for his sister to attend rehab in [place]. I spoke to the patient daughter, she agrees with her uncle.  The daughter agreed to matching the patient to rehab facilities in the [place] area [place].  Placed in e discharge and matched. The daughter stated the patients PCP is aware of the patients condition, she see Dr Gaylene BrooksYvonne Son [facility] in [place], [place].  I explained that the patient insurance may not cover the cost of [place] [facility] [place]. The patient will need  PASR prior to transport. Case management following.  Lurena NidaAngela L Baker, RN

## 2014-12-02 NOTE — Progress Notes (Signed)
Pt as assessed, VS with SBP higher, Dr. Excell SeltzerBaker has changed parameters to call for > 200.  SR.  Ventric intact and at 15 cm h20.  Drainage still S/S.  More  serous.      Pt on 4l NC and sats wnl no sob noted,     temp is high, >102 Dr. Excell SeltzerBaker aware, Dr. Brayton Marsomulo (id) IN TO SEE PATIENT, bcX2 pICC AND pERIPHERAL STICK SENT ALONG WITH URINE FOR c&s.  TYLENOL GIVEN AND COOL BATH GIVEN.      NIHSS 0 this am by 12 noon I noted there is a minor facial droop to the right (you need to count the teeth to see the difference.)    Foley remains in and uop is QS    Speech in for swallow eval, see orders.    Pt much more animated, engaging with visitors and staff, ate about 50% of pureed meal, no problems, Dr. Excell SeltzerBaker in.     Report given to [facility] Palena RN

## 2014-12-02 NOTE — Progress Notes (Addendum)
[facility] Dosing Services     Pharmacy dosing Vancomycin IV empirically for treatment of sepsis     Neurovascular Dx: 3 days post embolization of LVA pseudoaneurysm for SAH (4 days)    Currently on a regimen of 1.5 gm iv q8h    Day of Therapy: 3    Other Antibiotics: Meropenem    Labs:   Level: 18.8 mcg/dl (7 hours post dose)  Bun/Serum Creatinine - 10 mg/dl / 1.610.69 mg/dl  CrCl - 096.0107.3 ml/min  WBC - 12.6  Tmax - 101.7  I/Os (liter) - 1.3/4.4    Cultures:    Repeat culture for temp.    Neurovascular:   Tolerating extubation. Neurovascular status stable, continue fluid goal (IV and TF) 40 ml/hr.      Other considerations that may impact the kinetic model:  Increased SBP parameter to <180.  Off IV vasoactives, on nimodipine 30 mg q2h, furosemide prn.  Febrile.  Polyuria (UO 4.4 L), not hyponatremic, BG under control.     Vancomycin Kinetic model:  Doesn't fit standard model.  Adjusted Ke 0.137 (estimated CrCl as high as 160 ml/min), 1/2 life 5.1 hr, Volume of Distribution 54.8 L.     Plan:  Vancomycin trough within targeted range of 15-20 today. Continue same dose of 1.5 gm iv q8h and maximally concentrated (for CIV).  Close monitoring and recheck Vanco-trough tomorrow.  Pharmacy to follow and will make changes to dose and/or frequency based on clinical status.    Thanks for the consult.

## 2014-12-02 NOTE — Progress Notes (Signed)
Problem: Dysphagia (Adult)  Goal: *Acute Goals and Plan of Care (Insert Text)  Dysphagia Present: Yes     Ratings of Dysphagia: Mild (pharyngeal) Moderate (oral)    Aspiration: Yes (on thin)    Recommendations:  Diet: puree/HTL  Meds: whole one at a time, in pudding  Aspiration Precautions  Oral Care TID  Other: MBS when medically appropriate    Goals: Patient will:  1. Tolerate PO trials with 0 s/s overt distress in 4/5 trials  2. Utilize compensatory swallow strategies/maneuvers (decrease bite/sip, size/rate, alt. liq/sol) with min cues in 4/5 trials  3. Perform oral-motor/laryngeal exercises to increase oropharyngeal swallow function with min cues  4. Complete an objective swallow study (i.e., MBSS) to assess swallow integrity, r/o aspiration, and determine of safest LRD, min A  Outcome: Progressing Towards Goal  SPEECH LANGUAGE PATHOLOGY BEDSIDE SWALLOW EVALUATION    Patient: Renee West (59 y.o. female)  Date: 12/02/2014  Primary Diagnosis: SAH (subarachnoid hemorrhage) (HCC)        Precautions: aspiration  Fall (bedrest)      ASSESSMENT :  Based on the objective data described below, the patient presents with mild pharyngeal/moderate oral phase in the setting of SAH. Pt alert Ox4; basic cognitive-linguistic function appears intact. Pt accepted tsp and straw sips of thin liquids with immediate s/s aspiration (wet vocal quality, audible swallow, throat clears). Nectar-thick liquids with throat clear x 1. Honey-thick WNL. Pudding accepted WNL. Cracker accepted with prolonged mastication with significant lingual spread; required liquid wash. Secondary to [facility] restriction and decreased endurance, SLP recommending initiation of puree/HTL diet with SLP to follow for diet tolerance and advancement.     Educated pt on aspiration precautions and importance of compensatory swallow techniques to decrease aspiration risk (decrease rate of intake & sip/bite size, upright  for all po intake and ~30 minutes after po);  verbalized comprehension. Results d/w RN, Janyth Contes.    Patient will benefit from skilled intervention to address the above impairments.  Patient???s rehabilitation potential is considered to be Good  Factors which may influence rehabilitation potential include:               None noted              Mental ability/status              Medical condition              Home/family situation and support systems              Safety awareness              Pain tolerance/management              Other:        PLAN :  Recommendations and Planned Interventions:  Puree/HTL  Frequency/Duration: Patient will be followed by speech-language pathology 1-2 times per day/4-7 days per week to address goals.  Discharge Recommendations: In-Patient acute       SUBJECTIVE:   Patient stated ???thank you???.      OBJECTIVE:       Past Medical History   Diagnosis Date   ??? Hypothyroid     ??? DVT (deep venous thrombosis) (HCC)     ??? Warfarin anticoagulation     ??? 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 Hydrocephalus 11/28/2014   ??? 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 LVA intracranial ruptured dissecting pseudoaneurysm endovascular sacrifice 11/29/2014  Past Surgical History   Procedure Laterality Date   ??? Hx hysterectomy       ??? Hx thyroidectomy       ??? 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 Hx csf shunt   11/28/2014       Right frontal ventriculostomy     Prior Level of Function/Home Situation: lives alone   Home Situation  Home Environment: Private residence  One/Two Story Residence: Two story  Living Alone: Yes  Support Systems: Family member(s)  Current DME Used/Available at Home: None  Diet prior to admission: regular  Current Diet:  Puree/HTL   Cognitive and Communication Status:  Neurologic State: Alert  Orientation Level: Oriented X4  Cognition: Appropriate for age attention/concentration  Perception: Appears intact   Perseveration: No perseveration noted  Safety/Judgement: Fall prevention  Oral Assessment:  Oral Assessment  Labial: Left droop (minimal)  Dentition: Natural  Oral Hygiene: good  Lingual: No impairment  Velum: No impairment  Mandible: No impairment  P.O. Trials:  Patient Position: HOB 40  Vocal quality prior to P.O.: Low volume  Consistency Presented: Thin liquid  How Presented: SLP-fed/presented;Cup/sip;Spoon;Straw     Bolus Acceptance: No impairment  Bolus Formation/Control: Impaired  Type of Impairment: Mastication  Propulsion: Delayed (# of seconds)  Oral Residue: 10-50% of bolus;Lingual  Initiation of Swallow: Delayed (# of seconds)  Laryngeal Elevation: Decreased  Aspiration Signs/Symptoms: Watery eyes;Clear throat;Change vocal quality  Pharyngeal Phase Characteristics: Poor endurance;Audible swallow;Altered vocal quality  Effective Modifications: Small sips and bites  Cues for Modifications: Minimal-moderate       Oral Phase Severity: Moderate  Pharyngeal Phase Severity : Mild    PAIN:  Start of Eval: 0  End of Eval: 0     After treatment:   [ ]             Patient left in no apparent distress sitting up in chair  [X]             Patient left in no apparent distress in bed  [X]             Call bell left within reach  [X]             Nursing notified  [ ]             Caregiver present  [ ]             Bed alarm activated      COMMUNICATION/EDUCATION:   [ ]             Posted safety precautions in patient's room.  [X]             Patient/family have participated as able in goal setting and plan of care.  [ ]             Patient/family agree to work toward stated goals and plan of care.  [ ]             Patient understands intent and goals of therapy; neutral about participation.  [ ]             Patient is unable to participate in goal setting and plan of care.    Thank you for this referral.  Felecia Janossana A Cardoso, SLP  Time Calculation: 45 mins

## 2014-12-03 ENCOUNTER — Inpatient Hospital Stay
Admit: 2014-12-03 | Payer: BLUE CROSS/BLUE SHIELD | Primary: Student in an Organized Health Care Education/Training Program

## 2014-12-03 LAB — CBC WITH AUTOMATED DIFF
ABS. BASOPHILS: 0 10*3/uL (ref 0.0–0.06)
ABS. EOSINOPHILS: 0.1 10*3/uL (ref 0.0–0.4)
ABS. LYMPHOCYTES: 1.5 10*3/uL (ref 0.9–3.6)
ABS. MONOCYTES: 1.6 10*3/uL — ABNORMAL HIGH (ref 0.05–1.2)
ABS. NEUTROPHILS: 12.2 10*3/uL — ABNORMAL HIGH (ref 1.8–8.0)
BASOPHILS: 0 % (ref 0–2)
EOSINOPHILS: 1 % (ref 0–5)
HCT: 33.1 % — ABNORMAL LOW (ref 35.0–45.0)
HGB: 11 g/dL — ABNORMAL LOW (ref 12.0–16.0)
LYMPHOCYTES: 10 % — ABNORMAL LOW (ref 21–52)
MCH: 28.5 PG (ref 24.0–34.0)
MCHC: 33.2 g/dL (ref 31.0–37.0)
MCV: 85.8 FL (ref 74.0–97.0)
MONOCYTES: 10 % (ref 3–10)
MPV: 10.6 FL (ref 9.2–11.8)
NEUTROPHILS: 79 % — ABNORMAL HIGH (ref 40–73)
PLATELET: 137 10*3/uL (ref 135–420)
RBC: 3.86 M/uL — ABNORMAL LOW (ref 4.20–5.30)
RDW: 14.6 % — ABNORMAL HIGH (ref 11.6–14.5)
WBC: 15.4 10*3/uL — ABNORMAL HIGH (ref 4.6–13.2)

## 2014-12-03 LAB — CELL COUNT, CSF
CSF LYMPH: 25 %
CSF MONO: 1 %
CSF Neutrophils: 74 % — ABNORMAL HIGH (ref 0–6)
CSF WBCs: 15 /mm3 — ABNORMAL HIGH (ref <5)

## 2014-12-03 LAB — VANCOMYCIN, TROUGH
Reported dose date: 20160604
Reported dose time:: 100
Vancomycin,trough: 17.5 ug/mL (ref 10.0–20.0)

## 2014-12-03 LAB — HEPATIC FUNCTION PANEL
A-G Ratio: 0.5 — ABNORMAL LOW (ref 0.8–1.7)
ALT (SGPT): 26 U/L (ref 13–56)
AST (SGOT): 25 U/L (ref 15–37)
Albumin: 2.6 g/dL — ABNORMAL LOW (ref 3.4–5.0)
Alk. phosphatase: 104 U/L (ref 45–117)
Bilirubin, direct: 0.3 MG/DL — ABNORMAL HIGH (ref 0.0–0.2)
Bilirubin, total: 0.9 MG/DL (ref 0.2–1.0)
Globulin: 4.8 g/dL — ABNORMAL HIGH (ref 2.0–4.0)
Protein, total: 7.4 g/dL (ref 6.4–8.2)

## 2014-12-03 LAB — METABOLIC PANEL, BASIC
Anion gap: 10 mmol/L (ref 3.0–18)
BUN/Creatinine ratio: 18 (ref 12–20)
BUN: 12 MG/DL (ref 7.0–18)
CO2: 25 mmol/L (ref 21–32)
Calcium: 8.1 MG/DL — ABNORMAL LOW (ref 8.5–10.1)
Chloride: 108 mmol/L (ref 100–108)
Creatinine: 0.67 MG/DL (ref 0.6–1.3)
GFR est AA: >60 mL/min/{1.73_m2} (ref >60)
GFR est non-AA: >60 mL/min/{1.73_m2} (ref >60)
Glucose: 112 mg/dL — ABNORMAL HIGH (ref 74–99)
Potassium: 3.7 mmol/L (ref 3.5–5.5)
Sodium: 143 mmol/L (ref 136–145)

## 2014-12-03 LAB — FIBRINOGEN: Fibrinogen: 592 mg/dL — ABNORMAL HIGH (ref 210–451)

## 2014-12-03 LAB — GLUCOSE, CSF: Glucose,CSF: 78 MG/DL — ABNORMAL HIGH (ref 40–70)

## 2014-12-03 LAB — CALCIUM, IONIZED: Ionized Calcium: 1.15 MMOL/L (ref 1.12–1.32)

## 2014-12-03 LAB — AMYLASE: Amylase: 188 U/L — ABNORMAL HIGH (ref 25–115)

## 2014-12-03 LAB — PROTHROMBIN TIME + INR
INR: 1.2 (ref 0.8–1.2)
Prothrombin time: 15.1 s (ref 11.5–15.2)

## 2014-12-03 LAB — EOSINOPHILS, URINE

## 2014-12-03 LAB — MAGNESIUM: Magnesium: 2.6 mg/dL — ABNORMAL HIGH (ref 1.8–2.4)

## 2014-12-03 LAB — TSH 3RD GENERATION: TSH: 0.19 u[IU]/mL — ABNORMAL LOW (ref 0.36–3.74)

## 2014-12-03 LAB — POTASSIUM: Potassium: 3.9 mmol/L (ref 3.5–5.5)

## 2014-12-03 LAB — PTT: aPTT: 33.1 s (ref 24.6–37.7)

## 2014-12-03 LAB — PHOSPHORUS: Phosphorus: 2.3 MG/DL — ABNORMAL LOW (ref 2.5–4.9)

## 2014-12-03 LAB — T4, FREE: T4, Free: 0.9 NG/DL (ref 0.7–1.5)

## 2014-12-03 LAB — LIPASE: Lipase: 2067 U/L — ABNORMAL HIGH (ref 73–393)

## 2014-12-03 LAB — T3, FREE: Triiodothyronine (T3), free: 2.7 PG/ML (ref 2.3–4.2)

## 2014-12-03 LAB — AMMONIA: Ammonia, plasma: 44 umol/L — ABNORMAL HIGH (ref 11–32)

## 2014-12-03 LAB — D DIMER: D DIMER: >20 ug/ml(FEU) — ABNORMAL HIGH (ref <0.46)

## 2014-12-03 LAB — D-DIMER, QUANTITATIVE: D-Dimer, Quant: >20 ug/ml(FEU) — ABNORMAL HIGH (ref <0.46)

## 2014-12-03 MED ORDER — SODIUM CHLORIDE 0.9 % INJECTION
20 mg/2 mL | Freq: Two times a day (BID) | INTRAMUSCULAR | Status: DC
Start: 2014-12-03 — End: 2014-12-06
  Administered 2014-12-03 – 2014-12-06 (×3): via INTRAVENOUS

## 2014-12-03 MED ORDER — POTASSIUM CHLORIDE 20 MEQ/50 ML IV PIGGY BACK
20 mEq/50 mL | Freq: Once | INTRAVENOUS | Status: AC
Start: 2014-12-03 — End: 2014-12-03
  Administered 2014-12-03: 10:00:00 via INTRAVENOUS

## 2014-12-03 MED ORDER — FAMOTIDINE 20 MG TAB
20 mg | Freq: Two times a day (BID) | ORAL | Status: DC
Start: 2014-12-03 — End: 2014-12-06
  Administered 2014-12-03 – 2014-12-06 (×7): via ORAL

## 2014-12-03 MED ORDER — SODIUM CHLORIDE 0.9 % INJECTION
202 mg/2 mL | Freq: Two times a day (BID) | INTRAMUSCULAR | Status: DC
Start: 2014-12-03 — End: 2014-12-02

## 2014-12-03 MED ORDER — IBUPROFEN 600 MG TAB
600 mg | Freq: Four times a day (QID) | ORAL | Status: DC
Start: 2014-12-03 — End: 2014-12-06
  Administered 2014-12-03 – 2014-12-06 (×13): via ORAL

## 2014-12-03 MED ORDER — FOLIC ACID 1 MG TAB
1 mg | Freq: Every day | ORAL | Status: DC
Start: 2014-12-03 — End: 2014-12-12
  Administered 2014-12-03 – 2014-12-12 (×10): via ORAL

## 2014-12-03 MED ORDER — PHARMACY VANCOMYCIN NOTE
Status: DC | PRN
Start: 2014-12-03 — End: 2014-12-04

## 2014-12-03 MED ORDER — THIAMINE MONONITRATE 100 MG TABLET
100 mg | Freq: Every day | ORAL | Status: DC
Start: 2014-12-03 — End: 2014-12-12
  Administered 2014-12-03 – 2014-12-12 (×9): via ORAL

## 2014-12-03 MED ORDER — IOPAMIDOL 61 % IV SOLN
300 mg iodine /mL (61 %) | Freq: Once | INTRAVENOUS | Status: AC
Start: 2014-12-03 — End: 2014-12-03
  Administered 2014-12-03: 20:00:00 via INTRAVENOUS

## 2014-12-03 MED ORDER — THERAPEUTIC MULTIVITAMIN TAB
Freq: Every day | ORAL | Status: DC
Start: 2014-12-03 — End: 2014-12-12
  Administered 2014-12-03 – 2014-12-12 (×10): via ORAL

## 2014-12-03 MED ORDER — POTASSIUM CHLORIDE 10 MEQ/50 ML IV PIGGY BACK
10 mEq/50 mL | Freq: Once | INTRAVENOUS | Status: AC
Start: 2014-12-03 — End: 2014-12-03
  Administered 2014-12-03: 20:00:00 via INTRAVENOUS

## 2014-12-03 MED ORDER — POTASSIUM & SODIUM PHOSPHATES 280 MG-160 MG-250 MG ORAL POWDER PACKET
280-160-250 mg | ORAL | Status: AC
Start: 2014-12-03 — End: 2014-12-03
  Administered 2014-12-03 (×2): via ORAL

## 2014-12-03 MED ORDER — D5-NS WITH POTASSIUM CHLORIDE 40 MEQ/L IV
40 mEq/L | INTRAVENOUS | Status: DC
Start: 2014-12-03 — End: 2014-12-04
  Administered 2014-12-03 (×2): via INTRAVENOUS

## 2014-12-03 MED ORDER — THIAMINE 50 MG TAB
50 mg | Freq: Every day | ORAL | Status: DC
Start: 2014-12-03 — End: 2014-12-03

## 2014-12-03 MED FILL — TYLENOL 325 MG TABLET: 325 mg | ORAL | Qty: 2

## 2014-12-03 MED FILL — PHOS-NAK 280 MG-160 MG-250 MG ORAL POWDER PACKET: 280-160-250 mg | ORAL | Qty: 2

## 2014-12-03 MED FILL — THIAMINE MONONITRATE 100 MG TABLET: 100 mg | ORAL | Qty: 1

## 2014-12-03 MED FILL — IPRATROPIUM-ALBUTEROL 2.5 MG-0.5 MG/3 ML NEB SOLUTION: 2.5 mg-0.5 mg/3 ml | RESPIRATORY_TRACT | Qty: 3

## 2014-12-03 MED FILL — MAGNESIUM SULFATE 2 GRAM/50 ML IVPB: 2 gram/50 mL (4 %) | INTRAVENOUS | Qty: 50

## 2014-12-03 MED FILL — NIMODIPINE 30 MG CAP: 30 mg | ORAL | Qty: 1

## 2014-12-03 MED FILL — SODIUM CHLORIDE 0.9 % IV PIGGY BACK: INTRAVENOUS | Qty: 50

## 2014-12-03 MED FILL — IBUPROFEN 600 MG TAB: 600 mg | ORAL | Qty: 1

## 2014-12-03 MED FILL — THERA 400 MCG TABLET: 400 mcg | ORAL | Qty: 1

## 2014-12-03 MED FILL — VENOFER 100 MG IRON/5 ML INTRAVENOUS SOLUTION: 100 mg iron/5 mL | INTRAVENOUS | Qty: 10

## 2014-12-03 MED FILL — FOLIC ACID 1 MG TAB: 1 mg | ORAL | Qty: 1

## 2014-12-03 MED FILL — VANCOMYCIN 10 GRAM IV SOLR: 10 gram | INTRAVENOUS | Qty: 1500

## 2014-12-03 MED FILL — SENNA PLUS 8.6 MG-50 MG TABLET: ORAL | Qty: 2

## 2014-12-03 MED FILL — PHARMACY VANCOMYCIN NOTE: Qty: 1

## 2014-12-03 MED FILL — ASPIRIN 81 MG CHEWABLE TAB: 81 mg | ORAL | Qty: 1

## 2014-12-03 MED FILL — FAMOTIDINE 20 MG TAB: 20 mg | ORAL | Qty: 1

## 2014-12-03 MED FILL — D5-NS WITH POTASSIUM CHLORIDE 40 MEQ/L IV: 40 mEq/L | INTRAVENOUS | Qty: 1000

## 2014-12-03 MED FILL — POTASSIUM CHLORIDE 20 MEQ/50 ML IV PIGGY BACK: 20 mEq/50 mL | INTRAVENOUS | Qty: 50

## 2014-12-03 MED FILL — ISOVUE-300  61 % INTRAVENOUS SOLUTION: 300 mg iodine /mL (61 %) | INTRAVENOUS | Qty: 97

## 2014-12-03 MED FILL — POTASSIUM CHLORIDE 10 MEQ/50 ML IV PIGGY BACK: 10 mEq/50 mL | INTRAVENOUS | Qty: 50

## 2014-12-03 MED FILL — VITAMIN B-1 50 MG TABLET: 50 mg | ORAL | Qty: 2

## 2014-12-03 NOTE — Progress Notes (Signed)
Neurovascular Progress Note      Patient: Renee West MRN: [id number]  SSN: [id number]    Date of Birth: Nov 01, 1955  Age: 59 y.o.  Sex: female      Events  Neurologically continued improvement with ability to pass swallow evaluation.    Blood pressure autoregulating upward going into peak cerebral vasospasm period.    Febrile for prolonged period requiring two antipyretics and external cooling systems.  Blood culture from PICC distal port has GPR; already on broad spectrum antimicrobials.    Vital Signs  Patient Vitals for the past 24 hrs:   Temp Pulse Resp BP SpO2   12/03/14 0900 99.7 ??F (37.6 ??C) 86 29 150/85 mmHg 96 %   12/03/14 0830 99.5 ??F (37.5 ??C) 85 26 (!) 165/98 mmHg 97 %   12/03/14 0800 99.5 ??F (37.5 ??C) 78 27 152/86 mmHg 99 %   12/03/14 0730 99.7 ??F (37.6 ??C) 86 23 160/86 mmHg 97 %   12/03/14 0700 99.9 ??F (37.7 ??C) 77 26 158/82 mmHg 98 %   12/03/14 0630 - 87 22 153/88 mmHg 96 %   12/03/14 0600 100 ??F (37.8 ??C) 86 24 (!) 151/93 mmHg 95 %   12/03/14 0530 - 84 29 153/83 mmHg 97 %   12/03/14 0500 (!) 100.6 ??F (38.1 ??C) 78 29 (!) 162/96 mmHg 97 %   12/03/14 0430 - 80 22 153/77 mmHg 96 %   12/03/14 0428 - - - - 96 %   12/03/14 0400 (!) 101.3 ??F (38.5 ??C) 76 28 (!) 133/113 mmHg 95 %   12/03/14 0330 - 84 29 158/72 mmHg 93 %   12/03/14 0300 (!) 101.8 ??F (38.8 ??C) 96 28 174/86 mmHg 94 %   12/03/14 0230 - 97 (!) 31 (!) 184/94 mmHg 92 %   12/03/14 0200 (!) 102 ??F (38.9 ??C) 93 28 (!) 180/94 mmHg 93 %   12/03/14 0130 - 89 30 168/84 mmHg 93 %   12/03/14 0100 (!) 101.7 ??F (38.7 ??C) 89 27 (!) 195/100 mmHg 95 %   12/03/14 0032 - - - - 100 %   12/03/14 0030 - 85 (!) 31 (!) 191/94 mmHg 95 %   12/03/14 0000 (!) 101.5 ??F (38.6 ??C) (!) 105 23 - 94 %   12/02/14 2330 - 91 27 166/83 mmHg 96 %   12/02/14 2300 (!) 101.8 ??F (38.8 ??C) 96 25 171/82 mmHg 98 %   12/02/14 2200 (!) 102.2 ??F (39 ??C) 96 27 159/79 mmHg 94 %   12/02/14 2130 - (!) 104 26 (!) 189/97 mmHg 95 %   12/02/14 2100 (!) 102 ??F (38.9 ??C) 93 29 (!) 178/91 mmHg 95 %    12/02/14 2030 - 85 27 (!) 181/99 mmHg 94 %   12/02/14 2000 (!) 101.5 ??F (38.6 ??C) 88 30 (!) 175/93 mmHg 94 %   12/02/14 1930 - 96 30 164/85 mmHg 93 %   12/02/14 1900 - 83 (!) 34 159/88 mmHg 94 %   12/02/14 1848 - 85 - 159/82 mmHg -   12/02/14 1845 - 80 (!) 34 - 93 %   12/02/14 1830 - 88 (!) 34 159/82 mmHg 93 %   12/02/14 1815 - 82 (!) 35 - 92 %   12/02/14 1800 (!) 101.5 ??F (38.6 ??C) 86 (!) 34 (!) 177/94 mmHg 94 %   12/02/14 1756 - - - (!) 178/105 mmHg -   12/02/14 1745 - 99 29 - 94 %   12/02/14 1730 - 82 28 (!)  189/124 mmHg 96 %   12/02/14 1715 - 82 29 - 94 %   12/02/14 1700 - 86 (!) 32 (!) 183/102 mmHg 95 %   12/02/14 1645 - 87 28 - 94 %   12/02/14 1642 - - - - 95 %   12/02/14 1641 - (!) 102 19 - 95 %   12/02/14 1630 - 93 27 (!) 177/98 mmHg 94 %   12/02/14 1615 - 89 (!) 31 - 94 %   12/02/14 1600 (!) 101.8 ??F (38.8 ??C) 94 25 (!) 172/92 mmHg 94 %   12/02/14 1545 - 86 29 - 93 %   12/02/14 1530 - 93 29 (!) 173/113 mmHg 93 %   12/02/14 1515 - 92 (!) 31 - 95 %   12/02/14 1504 - 83 - (!) 183/93 mmHg -   12/02/14 1500 (!) 101.7 ??F (38.7 ??C) 91 25 (!) 183/93 mmHg 95 %   12/02/14 1445 - 79 27 - 94 %   12/02/14 1430 - 81 29 171/86 mmHg 94 %   12/02/14 1415 - 90 29 - 95 %   12/02/14 1400 (!) 101.1 ??F (38.4 ??C) 83 23 167/85 mmHg 95 %   12/02/14 1345 - 89 26 - 96 %   12/02/14 1330 - 74 25 167/81 mmHg 96 %   12/02/14 1319 - - - - 96 %   12/02/14 1318 - 65 - 165/85 mmHg -   12/02/14 1315 - 79 24 - 96 %   12/02/14 1300 (!) 100.9 ??F (38.3 ??C) 71 24 165/85 mmHg 96 %   12/02/14 1245 - 75 29 - 95 %   12/02/14 1230 - 79 25 164/77 mmHg 95 %   12/02/14 1215 - 86 25 - 96 %   12/02/14 1200 (!) 101.8 ??F (38.8 ??C) 88 22 159/77 mmHg (!) 89 %   12/02/14 1145 - 80 24 - 94 %   12/02/14 1130 - 82 24 159/87 mmHg 93 %   12/02/14 1115 - 86 26 - 93 %   12/02/14 1100 - 88 24 181/83 mmHg 95 %   12/02/14 1056 (!) 102.4 ??F (39.1 ??C) - - - -   12/02/14 1053 - 79 - 182/88 mmHg -   12/02/14 1045 - 84 24 - 95 %   12/02/14 1030 - 81 25 182/81 mmHg 94 %    12/02/14 1015 - 80 25 - 94 %     ICP  10-15 with EVD 15cm open    NIHSS Flow Sheet  Total: 1 (12/03/14 0900)       Intake and Output    Intake/Output Summary (Last 24 hours) at 12/03/14 1004  Last data filed at 12/03/14 0900   Gross per 24 hour   Intake 1856.17 ml   Output   2729 ml   Net -872.83 ml     CSF drainage:  211 ml over 24 hours    Data  PCXR resolving LLL process. PICC in good position.  Recent Results (from the past 24 hour(s))   GLUCOSE, POC    Collection Time: 12/02/14 11:55 AM   Result Value Ref Range    Glucose (POC) 95 70 - 110 mg/dL   GLUCOSE, POC    Collection Time: 12/02/14  4:12 PM   Result Value Ref Range    Glucose (POC) 124 (H) 70 - 110 mg/dL   PROTHROMBIN TIME + INR    Collection Time: 12/03/14  3:30 AM   Result Value Ref Range  Prothrombin time 15.1 11.5 - 15.2 sec    INR 1.2 0.8 - 1.2     PTT    Collection Time: 12/03/14  3:30 AM   Result Value Ref Range    aPTT 33.1 24.6 - 54.0 SEC   METABOLIC PANEL, BASIC    Collection Time: 12/03/14  3:30 AM   Result Value Ref Range    Sodium 143 136 - 145 mmol/L    Potassium 3.7 3.5 - 5.5 mmol/L    Chloride 108 100 - 108 mmol/L    CO2 25 21 - 32 mmol/L    Anion gap 10 3.0 - 18 mmol/L    Glucose 112 (H) 74 - 99 mg/dL    BUN 12 7.0 - 18 MG/DL    Creatinine 0.67 0.6 - 1.3 MG/DL    BUN/Creatinine ratio 18 12 - 20      GFR est AA >60 >60 ml/min/1.49m    GFR est non-AA >60 >60 ml/min/1.72m   Calcium 8.1 (L) 8.5 - 10.1 MG/DL   CALCIUM, IONIZED    Collection Time: 12/03/14  3:30 AM   Result Value Ref Range    Ionized Calcium 1.15 1.12 - 1.32 MMOL/L   MAGNESIUM    Collection Time: 12/03/14  3:30 AM   Result Value Ref Range    Magnesium 2.6 (H) 1.8 - 2.4 mg/dL   PHOSPHORUS    Collection Time: 12/03/14  3:30 AM   Result Value Ref Range    Phosphorus 2.3 (L) 2.5 - 4.9 MG/DL   AMMONIA    Collection Time: 12/03/14  3:30 AM   Result Value Ref Range    Ammonia 44 (H) 11 - 32 UMOL/L   AMYLASE    Collection Time: 12/03/14  3:30 AM   Result Value Ref Range     Amylase 188 (H) 25 - 115 U/L   LIPASE    Collection Time: 12/03/14  3:30 AM   Result Value Ref Range    Lipase 2067 (H) 73 - 393 U/L   HEPATIC FUNCTION PANEL    Collection Time: 12/03/14  3:30 AM   Result Value Ref Range    Protein, total 7.4 6.4 - 8.2 g/dL    Albumin 2.6 (L) 3.4 - 5.0 g/dL    Globulin 4.8 (H) 2.0 - 4.0 g/dL    A-G Ratio 0.5 (L) 0.8 - 1.7      Bilirubin, total 0.9 0.2 - 1.0 MG/DL    Bilirubin, direct 0.3 (H) 0.0 - 0.2 MG/DL    Alk. phosphatase 104 45 - 117 U/L    AST 25 15 - 37 U/L    ALT 26 13 - 56 U/L   D DIMER    Collection Time: 12/03/14  3:30 AM   Result Value Ref Range    D DIMER >20.00 (H) <0.46 ug/ml(FEU)   FIBRINOGEN    Collection Time: 12/03/14  3:30 AM   Result Value Ref Range    Fibrinogen 592 (H) 210 - 451 mg/dL   TSH 3RD GENERATION    Collection Time: 12/03/14  3:30 AM   Result Value Ref Range    TSH 0.19 (L) 0.36 - 3.74 uIU/mL   T4, FREE    Collection Time: 12/03/14  3:30 AM   Result Value Ref Range    T4, Free 0.9 0.7 - 1.5 NG/DL   T3, FREE    Collection Time: 12/03/14  3:30 AM   Result Value Ref Range    Triiodothyronine (T3), free 2.7 2.3 -  4.2 PG/ML   CBC WITH AUTOMATED DIFF    Collection Time: 12/03/14  3:30 AM   Result Value Ref Range    WBC 15.4 (H) 4.6 - 13.2 K/uL    RBC 3.86 (L) 4.20 - 5.30 M/uL    HGB 11.0 (L) 12.0 - 16.0 g/dL    HCT 33.1 (L) 35.0 - 45.0 %    MCV 85.8 74.0 - 97.0 FL    MCH 28.5 24.0 - 34.0 PG    MCHC 33.2 31.0 - 37.0 g/dL    RDW 14.6 (H) 11.6 - 14.5 %    PLATELET 137 135 - 420 K/uL    MPV 10.6 9.2 - 11.8 FL    NEUTROPHILS 79 (H) 40 - 73 %    LYMPHOCYTES 10 (L) 21 - 52 %    MONOCYTES 10 3 - 10 %    EOSINOPHILS 1 0 - 5 %    BASOPHILS 0 0 - 2 %    ABS. NEUTROPHILS 12.2 (H) 1.8 - 8.0 K/UL    ABS. LYMPHOCYTES 1.5 0.9 - 3.6 K/UL    ABS. MONOCYTES 1.6 (H) 0.05 - 1.2 K/UL    ABS. EOSINOPHILS 0.1 0.0 - 0.4 K/UL    ABS. BASOPHILS 0.0 0.0 - 0.06 K/UL    DF AUTOMATED     EOSINOPHILS, URINE    Collection Time: 12/03/14  4:25 AM   Result Value Ref Range     Eosinophils,urine PRESENT. 0.5%    CELL COUNT, CSF    Collection Time: 12/03/14  4:40 AM   Result Value Ref Range    CSF TUBE NO. BAGGED SPECIMEN     CSF COLOR XANTHOCHROMIC     CSF APPEARANCE BLOODY     CSF RBCS  0 /cu mm     SPECIMEN GROSSLY BLOODY. RBC'S TOO NUMEROUS TO COUNT.    CSF WBCS 15 (H) <5 /cu mm    CSF SEGS 74 (H) 0 - 6 %    CSF LYMPH 25 %    CSF MONO 1 %   GLUCOSE, CSF    Collection Time: 12/03/14  4:40 AM   Result Value Ref Range    Tube No. 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     1.) Neurologic: Neuro exam steady improvement as hydrocephalus decreased by additional CSF diversion after pseudoaneurysm secured.  CSF diversion continues going into peak cerebral vasospasm period then will wean after.  Maintain euthermia in vasospasm period as outcomes worse when febrile.   Aspirin to decrease risk of thromboemboli from devices used to sacrifice LVA.  There may have already been some emboli to [facility] territory on CT.  Repeat cerebral angiography to assess durability of LVA sacrifice and assess/treat vasospasm on Monday or sooner if symptoms.  CT preprocedure or for neuro changes.    SBP autoregulate.  Nimodipine for 21 days; dose split every 2 hours.  Maintin euvolemia. Watch for cerebral salt wasting.    PT/OT at bed level.      2.) Cardiovascular: SBP autoregulate and maintain euvolemia from HHE.     3.) Pulmonary: Fair oxygenation on nasal canula.  Nebs standing and as needed.    4.) Endocrine: Insulin scale none given; so stopped.  Hypothyroidism therapy held as initial TFT suggested hyperthyroidism.  Repeat TFT also low TSH with adequate fT3 and fT4.  Recheck TFTs in 3 days and restart levothyroxine as indicated.    5.) Hematology: Hgb now at goal after transfuse and iv iron.  D-dimer elevated above assay.  Platelets increasing after nadir two days ago; fibrinogen elevated. INR normalized after additional enteral vitamin k three days ago  Previous LE [place] no DVT but will recheck on Monday given fever and high risk.  SCDs. No pharmacologic DVT prophylaxis or treatment for now as may need manipulation of CSF diversion hardware.      6.) Fluids, Electrolytes, and Nutrition: Normotonic hydration; may need hypertonic if cerebral edema is problematic. Vitamins given.     7.) Gastrointestinal: Lipase above 2000 again with LFT changes.  RUQ [place] unrevealing. Discussed with Dr. Lu Duffel.  Consider CT.  PPI switched to H2 blocker as some reports of pancreatitis from PPI.  Minimize other medications that might be problematic as indicated.  Bowel regimen. Hemoccult stools as iron deficiency anemia while on warfarin.    8.) LTD: Foley continues for close I/O.  Art line out. PICC continues but may need to be replaced as below.    9.) ID: Significant and prolonged fever overnight even with multiple antipyretics and external cooling system.  Discussed with Dr. Kellie Moor.  The timing is consistent with central fever but must be vigilant for infection and possible drug reaction.  Procalcitonin less than 1 also consistent with non-infectious source.        BCx from PICC distal port has GPR; repeat sent.  If positive and fever persistsj on antimicrobials may need to replace PICC.  CSF studies unrevealing so far. U/A UCx unrevealing so far.    PCXR improving while  mini BAL had Enterobacter aerogenes and Candida albicans. Broad spectrum antibiotics to penetrate CNS with EVD in place until culture data can guide adjustment.      Lipase elevated.  Workup for pancreatitis discussed with GI. Consider CT.  Follow enzymes.    Urine EOS positive consistent with drug reaction or cholesterol emboli syndrome.  No changes in eGFR, urine sediment, or peripheral stigmata of emboli. Consider possible penicillin sensitivity on meropenem.    D-dimer greatly elevated.  Recheck for DVT.    Antipyretics and external cooling systems to maintain euthermia. Consider endovascular cooling system if refractory.  Repeat studies if temp > 100.5 every 48-72 hours.     Vira Blanco, MD

## 2014-12-03 NOTE — Progress Notes (Signed)
Respiratory Care Note:    Decreased FIO2 to 3 LPM. Sats 98%.

## 2014-12-03 NOTE — Progress Notes (Signed)
INFECTIOUS DISEASE FOLLOW UP NOTE :-     Admit Date: 11/28/2014     Current  Prior    Vanco, Meropenem 5/30-5      ASSESSMENT: -> RECS     Fever - since admission 5/30  - ?etiology: ?SIRS suspect from [facility], w/ elevated lipase 2008 ?pancreatitis  - blcx 5/31, urcx, CSF cx NGTD  - pyuria but urcx 5/30 NG, no pneumonia  -  CXR appear more fluid overload rather than infiltrates  - Lines and EVD very new, GPR periph bctx poss contam  -ua 0-3 wbc 0.5% eos, significance unclear without rash or aki   -6/1 CTH sphenoid sinus AFL likely d/t intubation now extubated and on bsabx   -rare enterobacter in sputum cxr neg and on meropenem  -pvl neg dvt LE 5/31  -complicated, d/w Dr. Excell Seltzer this am - RARE urine eos, single periph bctx with GPR not from picc may not be significant; lipase remains up biochem pancreatitis but pancreas grossly nl on CT  w/T 102.4 yesterday, wbc sl higher, Central fever probably dx of exclusion but possible here   ->monitor on current abx- streamline or stop in 1-2 days based on cultures/ response (or lack of)   Mild leukocytosis??  - 15k today  -> monitor??   NEW (+) BCTX GPR 1 of 2 6/3- ? contam (bacillus) ->continue abx pending ID and further cultures   SAH, hydrocephalus- s/p cerebral angiography, right frontal ventriculostomy, LCFV CVL 5/30 by Dr Excell Seltzer  - findings of LVA dissecting 3mm fusiform pseudoaneurysm and LM3 dissecting 2mm pseudoaneurysm  - s/p EVD placement  - s/p LVA pseudoaneurysm endovascular test occlusion, LVA pseudoaneurysm endovascular sacrifice deconstruction, cerebral angiography on 5/31 by Dr Excell Seltzer - Mx per Dr Excell Seltzer and Dr Su Ley??   Resp failure- s/p intubation -5/30?? - per others??   H/o DVT on coumadin  - currently on hold?? 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   Hypothyroiod?? ??           MICROBIOLOGY:   CSF 5/31, 6/2-5 ngtd  5/30  ucx ntd   rpr nr  5/31  Blcx x2 NTD,            Ucx neg           resp cx rare Enterobacter, CF and C albicans  6/3 bctx 1 of 2 GPR   uctx ngtd  6/4 bctx x 2 ngtd    LINES AND CATHETERS:   EVD 5/30  PICC 5/30    SUBJECTIVE :     Interval notes reviewed. D/w Dr. Excell Seltzer in detail this am, T up yesterday, periph bctx with GPR may be contam, rare urine eos but no rash or aki to suggest AIN, lipase up and GI to CT, pancreas grossly normal by CT report.      Pt says feels hot, denied h/a sinus drainage, cough sob n/v/d or abd pain or rash.  D/w nurse at bedside tonight.    Current Facility-Administered Medications   Medication Dose Route Frequency   ??? ibuprofen (MOTRIN) tablet 600 mg  600 mg Oral Q6H   ??? dextrose 5% - 0.9% NaCl with KCl 40 mEq/L infusion   IntraVENous CONTINUOUS   ??? Thiamine Mononitrate (B-1) tablet 100 mg  100 mg Oral DAILY   ??? VANCOMYCIN INFORMATION NOTE   Other PRN   ??? iron sucrose (VENOFER) 200 mg in 0.9% sodium chloride 100 mL IVPB  200 mg IntraVENous Q24H   ??? therapeutic  multivitamin (THERAGRAN) tablet 1 Tab  1 Tab Oral DAILY   ??? folic acid (FOLVITE) tablet 1 mg  1 mg Oral DAILY   ??? famotidine (PEPCID) tablet 20 mg  20 mg Oral Q12H   ??? famotidine (PF) (PEPCID) 20 mg in sodium chloride 0.9 % 10 mL injection  20 mg IntraVENous Q12H   ??? vancomycin (VANCOCIN) 1,500 mg in 0.9% sodium chloride 150 mL IVPB  1,500 mg IntraVENous Q8H   ??? 0.9% sodium chloride infusion 250 mL  250 mL IntraVENous PRN   ??? albuterol-ipratropium (DUO-NEB) 2.5 MG-0.5 MG/3 ML  3 mL Nebulization Q4H RT   ??? 0.9% sodium chloride infusion 250 mL  250 mL IntraVENous PRN   ??? acetaminophen (TYLENOL) tablet 650 mg  650 mg Oral Q4H PRN   ??? meropenem (MERREM) 1 g in 0.9% sodium chloride (MBP/ADV) 50 mL MBP  1 g IntraVENous Q8H   ??? niMODipine (NIMOTOP) capsule 30 mg  30 mg Oral Q2H   ??? aspirin chewable tablet 81 mg  81 mg Oral DAILY    Or   ??? aspirin (ASA) suppository 300 mg  300 mg Rectal DAILY   ??? ELECTROLYTE REPLACEMENT PROTOCOL  1 Each Other Q8H    ??? ELECTROLYTE REPLACEMENT PROTOCOL  1 Each Other Q8H   ??? ELECTROLYTE REPLACEMENT PROTOCOL  1 Each Other Q8H   ??? PHARMACY INFORMATION NOTE  1 Each Other DAILY   ??? ondansetron (ZOFRAN) injection 1 mg  1 mg IntraVENous Q6H PRN   ??? acetaminophen (TYLENOL) suppository 650 mg  650 mg Rectal Q4H PRN   ??? senna-docusate (PERICOLACE) 8.6-50 mg per tablet 2 Tab  2 Tab Oral QHS   ??? bisacodyl (DULCOLAX) tablet 5 mg  5 mg Oral DAILY PRN   ??? bisacodyl (DULCOLAX) suppository 10 mg  10 mg Rectal DAILY PRN   ??? magnesium sulfate 2 g/50 ml IVPB (premix or compounded)  2 g IntraVENous Q12H   ??? albuterol (PROVENTIL VENTOLIN) nebulizer solution 2.5 mg  2.5 mg Nebulization Q4H PRN   ??? 0.9% sodium chloride infusion  5 mL/hr IntraVENous CONTINUOUS   ??? sodium chloride (NS) flush 10 mL  10 mL InterCATHeter PRN   ??? sodium chloride (NS) flush 10-40 mL  10-40 mL InterCATHeter Q8H   ??? sodium chloride (NS) flush 10-30 mL  10-30 mL InterCATHeter PRN   ??? bacitracin 500 unit/gram packet 1 Packet  1 Packet Topical PRN          OBJECTIVE     BP 139/86 mmHg   Pulse 99   Temp(Src) 100 ??F (37.8 ??C)   Resp 22   Ht 5\' 6"  (1.676 m)   Wt 104 kg (229 lb 4.5 oz)   BMI 37.02 kg/m2   SpO2 96%    Temp (24hrs), Avg:100.5 ??F (38.1 ??C), Min:99.1 ??F (37.3 ??C), Max:102.2 ??F (39 ??C)    GEN - pleasant 59yr old [place] American female awake and conversing in nicu a bit shaky on cooling blanket  HEENT: bilateral conjunctival injection.  R ventric site dry serosang fluid in drain, no thrush  RESP- no crackles or wheezes  CVS- s1s2 normal, no murmur   ABD- soft, NT, BS present  EXT- no edema   SKIN - no rash   NEURO - , awake, answers appropriately   Labs: Results:   Chemistry Recent Labs      12/03/14   1230  12/03/14   0330  12/02/14   0405   12/01/14   1220  12/01/14  0400   GLU   --   112*  93   --    --   113*   NA   --   143  148*   --    --   152*   K  3.9  3.7  4.3   < >  3.9  3.6   CL   --   108  115*   --    --   121*   CO2   --   25  23   --    --   22    BUN   --   12  10   --    --   10   CREA   --   0.67  0.69   --    --   0.67   CA   --   8.1*  8.1*   --    --   6.6*   AGAP   --   10  10   --    --   9   BUCR   --   18  14   --    --   15   AP   --   104  107   --   87   --    TP   --   7.4  6.9   --   6.2*   --    ALB   --   2.6*  2.5*   --   2.2*   --    GLOB   --   4.8*  4.4*   --   4.0   --    AGRAT   --   0.5*  0.6*   --   0.6*   --     < > = values in this interval not displayed.      CBC w/Diff Recent Labs      12/03/14   0330  12/02/14   0405  12/01/14   1740  12/01/14   0400   WBC  15.4*  12.6   --   7.9   RBC  3.86*  3.61*   --   2.92*   HGB  11.0*  10.3*  9.7*  8.1*   HCT  33.1*  30.7*  28.9*  24.6*   PLT  137  128*   --   116*   GRANS  79*   --    --    --    LYMPH  10*   --    --    --    EOS  1   --    --    --       5/30 echo IMPRESSIONS:  There was no significant valvular heart disease.    RADIOLOGY :    CXR 6/1 -    US 6/3 IMPRESSION:  ??  ??  1. Increased echogenicity of the liver which can be seen with hepatic steatosis  or hepatocellular disease. Exam is otherwise unremarkable.    6/4 CT IMPRESSION:  PANCREAS: Grossly normal  ??  1.?? Streak artifact degrades images. No definite evidence of pancreatitis.  2. Large hiatal hernia with bilateral lower lobe areas of scarring versus  atelectasis.  3. Probable gallbladder sludge without obvious stones. No evidence of acute  cholecystitis.  4. Nonobstructing left nephrolithiasis.  5. Fibroid uterus.  6. No evidence of bowel obstruction. Normal appendix.  7. Induration of the subcutaneous fat bilateral inguinal regions. This could  represent scarring however hematomas from recent vascular procedures could cause  similar appearance. No large hematoma.      Berenice Bouton, MD  December 03, 2014  [facility]  [phone]

## 2014-12-03 NOTE — Other (Signed)
Bedside and Verbal shift change report given to Olena Yermak, RN (oncoming nurse) by Grace A Dawang, RN   (offgoing nurse). Report included the following information SBAR, Kardex, Intake/Output, MAR and Recent Results.

## 2014-12-03 NOTE — Progress Notes (Addendum)
1016 Spoke to RedmondKeith from CT who asked for patient to be NPO for the next 6H in preparation for CT of abdomen with contrast. Patient was ordered oral contrast which Mellody DanceKeith confirmed with Dr. Frederik PearMendu that it would be all right for nursing to thicken contrast for patient due to patient's swallowing status (on honey thickened fluids). CT tech stated ok to mix contrast with NS. Will call Dr. Excell SeltzerBaker to ask about volume of contrast.    1329 Spoke to Dr. Excell SeltzerBaker and Dr. Frederik PearMendu about PO contrast for CT. CT of pelvis to be done with IV contrast only. CT notified.

## 2014-12-03 NOTE — Progress Notes (Addendum)
Assumed care. Initial assessment done.Pt a/o x 4,  Denies any pain or discomfort. T = 99.5, cooling blanket on pt. EVD at 15, open. Foley, PICC clean, dry and intact. Foley care done. Bed locked and in low position, side rails up x3.   1000> Kept pt NPO at this time pending CT  1200> Foley care done. Tolerated well.   1300> Turned off cooling blanket at this time. Will continue to monitor.  1441> T = 100, motrin given, cooling blanket turned on.  1518> T = 100.6, tylenol PRN given  1530> Unhooked from cooling blanket, pt transported to CT at this time.   1611> Pt back from CT, T = 38.0C, connected back to cooling pad.  1630> T = 37.9C, will monitor.  1645> CT wet read called in by Dr. Aloha GellLazarow, Resident radiologist. Re: Mellody DrownWet read suggests pancreatitis. There is inflammatory stranding but no necrosis or anything to suggest immediate complication.   1700> Called and talked to Dr. Frederik PearMendu informing of the result. MD verbalized to just continue what we are doing.

## 2014-12-03 NOTE — Progress Notes (Addendum)
2000> Patient assessed. Patient is lying in bed, resting, family at bedside. Patient is A&O x 4. Patient denies having any pain or discomfort at this time. EVD at 15 above and open. Patient on cooling blanket. Foley and PICC in place. NC at 3 L. No further changes or complaints. Call bell within reach. Bed is locked in low position. Side rails up x 3. Will continue to monitor.  0000> No changes in patient's condition. Patient is sleeping. No signs of pain or distress. Will continue to monitor.  0400> No changes.  0700> Bedside and Verbal shift change report given to Pascal LuxLaura Garner, RN (oncoming nurse) by Donald Sivalena Yermak, RN (offgoing nurse). Report included the following information SBAR, Kardex, Intake/Output, MAR and Recent Results.

## 2014-12-04 LAB — METABOLIC PANEL, BASIC
Anion gap: 10 mmol/L (ref 3.0–18)
BUN/Creatinine ratio: 21 — ABNORMAL HIGH (ref 12–20)
BUN: 12 MG/DL (ref 7.0–18)
CO2: 24 mmol/L (ref 21–32)
Calcium: 8.1 MG/DL — ABNORMAL LOW (ref 8.5–10.1)
Chloride: 112 mmol/L — ABNORMAL HIGH (ref 100–108)
Creatinine: 0.58 MG/DL — ABNORMAL LOW (ref 0.6–1.3)
GFR est AA: >60 mL/min/{1.73_m2} (ref >60)
GFR est non-AA: >60 mL/min/{1.73_m2} (ref >60)
Glucose: 106 mg/dL — ABNORMAL HIGH (ref 74–99)
Potassium: 4.2 mmol/L (ref 3.5–5.5)
Sodium: 146 mmol/L — ABNORMAL HIGH (ref 136–145)

## 2014-12-04 LAB — CULTURE, BLOOD

## 2014-12-04 LAB — HEPATIC FUNCTION PANEL
A-G Ratio: 0.6 — ABNORMAL LOW (ref 0.8–1.7)
ALT (SGPT): 24 U/L (ref 13–56)
AST (SGOT): 23 U/L (ref 15–37)
Albumin: 2.7 g/dL — ABNORMAL LOW (ref 3.4–5.0)
Alk. phosphatase: 105 U/L (ref 45–117)
Bilirubin, direct: 0.3 MG/DL — ABNORMAL HIGH (ref 0.0–0.2)
Bilirubin, total: 0.8 MG/DL (ref 0.2–1.0)
Globulin: 4.6 g/dL — ABNORMAL HIGH (ref 2.0–4.0)
Protein, total: 7.3 g/dL (ref 6.4–8.2)

## 2014-12-04 LAB — VANCOMYCIN, TROUGH
Reported dose:: 1.5 UNITS
Vancomycin,trough: 24.2 ug/mL — CR (ref 10.0–20.0)

## 2014-12-04 LAB — CBC W/O DIFF
HCT: 33.9 % — ABNORMAL LOW (ref 35.0–45.0)
HGB: 11.2 g/dL — ABNORMAL LOW (ref 12.0–16.0)
MCH: 28.5 PG (ref 24.0–34.0)
MCHC: 33 g/dL (ref 31.0–37.0)
MCV: 86.3 FL (ref 74.0–97.0)
MPV: 10.9 FL (ref 9.2–11.8)
PLATELET: 157 10*3/uL (ref 135–420)
RBC: 3.93 M/uL — ABNORMAL LOW (ref 4.20–5.30)
RDW: 14.7 % — ABNORMAL HIGH (ref 11.6–14.5)
WBC: 14 10*3/uL — ABNORMAL HIGH (ref 4.6–13.2)

## 2014-12-04 LAB — CULTURE, CSF W GRAM STAIN
Culture result:: NO GROWTH
GRAM STAIN: NONE SEEN

## 2014-12-04 LAB — CELL COUNT, CSF
CSF EOS: 1 %
CSF LYMPH: 26 %
CSF MONO: 3 %
CSF Neutrophils: 70 % — ABNORMAL HIGH (ref 0–6)
CSF WBCs: 18 /mm3 — ABNORMAL HIGH (ref <5)

## 2014-12-04 LAB — CULTURE, URINE
Culture result:: NO GROWTH
Culture: NO GROWTH

## 2014-12-04 LAB — MAGNESIUM: Magnesium: 2.2 mg/dL (ref 1.8–2.4)

## 2014-12-04 LAB — PHOSPHORUS
Phosphorus: 2.6 MG/DL (ref 2.5–4.9)
Phosphorus: 2.5 MG/DL (ref 2.5–4.9)
Phosphorus: 2.8 MG/DL (ref 2.5–4.9)

## 2014-12-04 LAB — AMMONIA: Ammonia, plasma: 29 umol/L (ref 11–32)

## 2014-12-04 LAB — PTT: aPTT: 31.2 s (ref 24.6–37.7)

## 2014-12-04 LAB — TYPE & SCREEN
ABO/Rh(D): AB POS
Antibody screen: NEGATIVE

## 2014-12-04 LAB — POTASSIUM: Potassium: 3.6 mmol/L (ref 3.5–5.5)

## 2014-12-04 LAB — PROTHROMBIN TIME + INR
INR: 1.1 (ref 0.8–1.2)
Prothrombin time: 14.1 s (ref 11.5–15.2)

## 2014-12-04 LAB — OCCULT BLOOD, STOOL: Occult blood, stool: NEGATIVE

## 2014-12-04 LAB — GLUCOSE, CSF: Glucose,CSF: 78 MG/DL — ABNORMAL HIGH (ref 40–70)

## 2014-12-04 LAB — AMYLASE: Amylase: 145 U/L — ABNORMAL HIGH (ref 25–115)

## 2014-12-04 LAB — CALCIUM, IONIZED: Ionized Calcium: 1.14 MMOL/L (ref 1.12–1.32)

## 2014-12-04 LAB — LIPASE: Lipase: 1475 U/L — ABNORMAL HIGH (ref 73–393)

## 2014-12-04 LAB — TYPE AND SCREEN
ABO/Rh: AB POS
Antibody Screen: NEGATIVE

## 2014-12-04 MED ORDER — IPRATROPIUM-ALBUTEROL 2.5 MG-0.5 MG/3 ML NEB SOLUTION
2.5 mg-0.5 mg/3 ml | Freq: Three times a day (TID) | RESPIRATORY_TRACT | Status: DC
Start: 2014-12-04 — End: 2014-12-08
  Administered 2014-12-04 – 2014-12-08 (×14): via RESPIRATORY_TRACT

## 2014-12-04 MED ORDER — POTASSIUM & SODIUM PHOSPHATES 280 MG-160 MG-250 MG ORAL POWDER PACKET
280-160-250 mg | Freq: Once | ORAL | Status: AC
Start: 2014-12-04 — End: 2014-12-04
  Administered 2014-12-04: 10:00:00 via ORAL

## 2014-12-04 MED ORDER — POTASSIUM CHLORIDE 20 MEQ/50 ML IV PIGGY BACK
20 mEq/50 mL | INTRAVENOUS | Status: AC
Start: 2014-12-04 — End: 2014-12-04
  Administered 2014-12-04 (×2): via INTRAVENOUS

## 2014-12-04 MED ORDER — POTASSIUM & SODIUM PHOSPHATES 280 MG-160 MG-250 MG ORAL POWDER PACKET
280-160-250 mg | Freq: Once | ORAL | Status: AC
Start: 2014-12-04 — End: 2014-12-03
  Administered 2014-12-04: 04:00:00 via ORAL

## 2014-12-04 MED ORDER — PHARMACY VANCOMYCIN NOTE
Freq: Once | Status: DC
Start: 2014-12-04 — End: 2014-12-05

## 2014-12-04 MED ORDER — D5-NS WITH POTASSIUM CHLORIDE 20 MEQ/L IV
20 mEq/L | INTRAVENOUS | Status: DC
Start: 2014-12-04 — End: 2014-12-05
  Administered 2014-12-04: 10:00:00 via INTRAVENOUS

## 2014-12-04 MED ORDER — PHARMACY VANCOMYCIN NOTE
Freq: Once | Status: DC
Start: 2014-12-04 — End: 2014-12-04
  Administered 2014-12-04: 20:00:00

## 2014-12-04 MED ORDER — SODIUM CHLORIDE 0.9 % IV
10 gram | Freq: Two times a day (BID) | INTRAVENOUS | Status: DC
Start: 2014-12-04 — End: 2014-12-06
  Administered 2014-12-05 – 2014-12-06 (×3): via INTRAVENOUS

## 2014-12-04 MED FILL — TYLENOL 325 MG TABLET: 325 mg | ORAL | Qty: 2

## 2014-12-04 MED FILL — NIMODIPINE 30 MG CAP: 30 mg | ORAL | Qty: 1

## 2014-12-04 MED FILL — MAGNESIUM SULFATE 2 GRAM/50 ML IVPB: 2 gram/50 mL (4 %) | INTRAVENOUS | Qty: 50

## 2014-12-04 MED FILL — IBUPROFEN 600 MG TAB: 600 mg | ORAL | Qty: 1

## 2014-12-04 MED FILL — FAMOTIDINE 20 MG TAB: 20 mg | ORAL | Qty: 1

## 2014-12-04 MED FILL — VANCOMYCIN 10 GRAM IV SOLR: 10 gram | INTRAVENOUS | Qty: 1500

## 2014-12-04 MED FILL — IPRATROPIUM-ALBUTEROL 2.5 MG-0.5 MG/3 ML NEB SOLUTION: 2.5 mg-0.5 mg/3 ml | RESPIRATORY_TRACT | Qty: 3

## 2014-12-04 MED FILL — PHOS-NAK 280 MG-160 MG-250 MG ORAL POWDER PACKET: 280-160-250 mg | ORAL | Qty: 2

## 2014-12-04 MED FILL — POTASSIUM CHLORIDE 20 MEQ/50 ML IV PIGGY BACK: 20 mEq/50 mL | INTRAVENOUS | Qty: 50

## 2014-12-04 MED FILL — ASPIRIN 81 MG CHEWABLE TAB: 81 mg | ORAL | Qty: 1

## 2014-12-04 MED FILL — PHARMACY VANCOMYCIN NOTE: Qty: 1

## 2014-12-04 MED FILL — SODIUM CHLORIDE 0.9 % IV PIGGY BACK: INTRAVENOUS | Qty: 50

## 2014-12-04 MED FILL — D5-NS WITH POTASSIUM CHLORIDE 20 MEQ/L IV: 20 mEq/L | INTRAVENOUS | Qty: 1000

## 2014-12-04 MED FILL — FOLIC ACID 1 MG TAB: 1 mg | ORAL | Qty: 1

## 2014-12-04 MED FILL — VENOFER 100 MG IRON/5 ML INTRAVENOUS SOLUTION: 100 mg iron/5 mL | INTRAVENOUS | Qty: 10

## 2014-12-04 MED FILL — SENNA PLUS 8.6 MG-50 MG TABLET: ORAL | Qty: 2

## 2014-12-04 MED FILL — THIAMINE MONONITRATE 100 MG TABLET: 100 mg | ORAL | Qty: 1

## 2014-12-04 MED FILL — THERA 400 MCG TABLET: 400 mcg | ORAL | Qty: 1

## 2014-12-04 NOTE — Progress Notes (Signed)
PROGRESS NOTE   PATIENT:  Renee West           MRN: [id number]           [facility], 2604/01           12/04/2014, 9:36 AM      I  Renee West is a 59 y.o. female who is being seen for pancreatitis     SUBJECTIVE:  Pt denies abdominal pain. Tolerating soft diet. Normal BM. No complaints.     OBJECTIVE:  Patient Vitals for the past 24 hrs:   Temp Pulse Resp BP SpO2   12/04/14 0800 (!) 101.2 ??F (38.4 ??C) (!) 107 (!) 31 (!) 190/103 mmHg 98 %   12/04/14 0700 (!) 100.9 ??F (38.3 ??C) (!) 112 29 (!) 181/94 mmHg 95 %   12/04/14 0600 (!) 100.7 ??F (38.2 ??C) (!) 107 29 (!) 176/91 mmHg 99 %   12/04/14 0500 100.1 ??F (37.8 ??C) 100 25 (!) 172/108 mmHg 98 %   12/04/14 0450 - - - - 99 %   12/04/14 0400 99.8 ??F (37.7 ??C) 90 29 (!) 165/92 mmHg 99 %   12/04/14 0300 99.2 ??F (37.3 ??C) 100 25 (!) 156/94 mmHg 99 %   12/04/14 0200 99.6 ??F (37.6 ??C) 92 27 168/81 mmHg 95 %   12/04/14 0100 99.9 ??F (37.7 ??C) 92 27 (!) 173/93 mmHg 98 %   12/04/14 0018 98.2 ??F (36.8 ??C) - - - 98 %   12/04/14 0000 99.8 ??F (37.7 ??C) 93 25 162/81 mmHg 99 %   12/03/14 2300 99.6 ??F (37.6 ??C) 95 24 157/81 mmHg 100 %   12/03/14 2200 99.6 ??F (37.6 ??C) 88 25 148/88 mmHg 95 %   12/03/14 2105 - - - - 95 %   12/03/14 2100 99.8 ??F (37.7 ??C) 96 24 134/59 mmHg 97 %   12/03/14 2030 - (!) 102 23 (!) 126/97 mmHg 98 %   12/03/14 2001 98.4 ??F (36.9 ??C) - - - -   12/03/14 2000 100 ??F (37.8 ??C) 95 26 148/90 mmHg 94 %   12/03/14 1930 - 99 30 147/84 mmHg 93 %   12/03/14 1900 100 ??F (37.8 ??C) 99 22 139/86 mmHg 96 %   12/03/14 1830 - 93 29 150/85 mmHg 95 %   12/03/14 1800 100.4 ??F (38 ??C) 95 22 (!) 134/97 mmHg 96 %   12/03/14 1730 100 ??F (37.8 ??C) 90 (!) 31 155/87 mmHg 97 %   12/03/14 1700 100.2 ??F (37.9 ??C) 96 24 152/83 mmHg 96 %   12/03/14 1630 100.2 ??F (37.9 ??C) 100 20 143/84 mmHg 96 %   12/03/14 1518 (!) 100.6 ??F (38.1 ??C) - - - -   12/03/14 1500 - 90 24 (!) 149/117 mmHg 96 %   12/03/14 1446 100 ??F (37.8 ??C) - - - -   12/03/14 1400 - 91 25 148/86 mmHg 96 %    12/03/14 1330 99.7 ??F (37.6 ??C) 84 24 161/82 mmHg 96 %   12/03/14 1300 99.3 ??F (37.4 ??C) 82 24 154/83 mmHg 97 %   12/03/14 1230 - 83 26 142/79 mmHg 96 %   12/03/14 1200 99.1 ??F (37.3 ??C) 96 23 (!) 142/98 mmHg 98 %   12/03/14 1130 - 78 23 143/85 mmHg 98 %   12/03/14 1100 99.3 ??F (37.4 ??C) (!) 101 26 132/86 mmHg 96 %   12/03/14 1000 99.5 ??F (37.5 ??C) 86 (!) 34 143/87 mmHg 94 %  Intake/Output Summary (Last 24 hours) at 12/04/14 0936  Last data filed at 12/04/14 0800   Gross per 24 hour   Intake 1568.5 ml   Output   2656 ml   Net -1087.5 ml       Gen: NAD, ventriculostomy on the right side   Heent: No pallor, icterus  Abd  : Soft, non tender, BS +, No masses felt.       Labs: Results:   Chemistry Recent Labs      12/04/14   0345  12/03/14   2200  12/03/14   1230  12/03/14   0330  12/02/14   0405   GLU  106*   --    --   112*  93   NA  146*   --    --   143  148*   K  4.2  3.6  3.9  3.7  4.3   CL  112*   --    --   108  115* CO2  24   --    --   25  23   BUN  12   --    --   12  10   CREA  0.58*   --    --   0.67  0.69   CA  8.1*   --    --   8.1*  8.1*   AGAP  10   --    --   10  10   BUCR  21*   --    --   18  14   AP  105   --    --   104  107   TP  7.3   --    --   7.4  6.9   ALB  2.7*   --    --   2.6*  2.5*   GLOB  4.6*   --    --   4.8*  4.4*   AGRAT  0.6*   --    --   0.5*  0.6*    Estimated Creatinine Clearance: 124.5 mL/min (based on Cr of 0.58).   CBC w/Diff Recent Labs      12/04/14   0345  12/03/14   0330  12/02/14   0405   WBC  14.0*  15.4*  12.6   RBC  3.93*  3.86*  3.61*   HGB  11.2*  11.0*  10.3*   HCT  33.9*  33.1*  30.7*   PLT  157  137  128*   GRANS   --   79*   --    LYMPH   --   10*   --    EOS   --   1   --       Cardiac Enzymes No results for input(s): CPK, CKND1, MYO in the last 72 hours.    Invalid input(s): CKRMB, TROIP   Coagulation Recent Labs      12/04/14   0345  12/03/14   0330   PTP  14.1  15.1   INR  1.1  1.2   APTT  31.2  33.1        Hepatitis Panel No results found for: HAMAT, HAAB, HABT, HAAT, XHBAGT, HBSAG, HBAGT, HBSB, HBSAT, HBABN, HBCM, HBCAB, HBCAT, XBCABS, HBEAB, HBEAG, HCAB, XHEPCS, [id number], HBEGLT, HBCMLT, HBCLT, HBEBLT, [id number], [id number], HAVMLT, [id number], HBCMLT, [id number], HCGAT   Amylase Lipase    Liver Enzymes  Recent Labs      12/04/14   0345  12/03/14   0330  12/02/14   0405   TP  7.3  7.4  6.9   ALB  2.7*  2.6*  2.5*   TBILI  0.8  0.9  0.8   AP  105  104  107   SGOT  23  25  31    ALT  24  26  24       Thyroid Studies Recent Labs      12/03/14   0330   TSH  0.19*        Pathology pathology         No Known Allergies    Current Facility-Administered Medications   Medication Dose Route Frequency   ??? dextrose 5% - 0.9% NaCl with KCl 20 mEq/L infusion  0-40 mL/hr IntraVENous CONTINUOUS   ??? albuterol-ipratropium (DUO-NEB) 2.5 MG-0.5 MG/3 ML  3 mL Nebulization Q8H RT   ??? ibuprofen (MOTRIN) tablet 600 mg  600 mg Oral Q6H   ??? Thiamine Mononitrate (B-1) tablet 100 mg  100 mg Oral DAILY   ??? VANCOMYCIN INFORMATION NOTE   Other PRN   ??? iron sucrose (VENOFER) 200 mg in 0.9% sodium chloride 100 mL IVPB  200 mg IntraVENous Q24H   ??? therapeutic multivitamin (THERAGRAN) tablet 1 Tab  1 Tab Oral DAILY   ??? folic acid (FOLVITE) tablet 1 mg  1 mg Oral DAILY   ??? famotidine (PEPCID) tablet 20 mg  20 mg Oral Q12H   ??? famotidine (PF) (PEPCID) 20 mg in sodium chloride 0.9 % 10 mL injection  20 mg IntraVENous Q12H   ??? vancomycin (VANCOCIN) 1,500 mg in 0.9% sodium chloride 150 mL IVPB  1,500 mg IntraVENous Q8H   ??? 0.9% sodium chloride infusion 250 mL  250 mL IntraVENous PRN   ??? 0.9% sodium chloride infusion 250 mL  250 mL IntraVENous PRN   ??? acetaminophen (TYLENOL) tablet 650 mg  650 mg Oral Q4H PRN   ??? meropenem (MERREM) 1 g in 0.9% sodium chloride (MBP/ADV) 50 mL MBP  1 g IntraVENous Q8H   ??? niMODipine (NIMOTOP) capsule 30 mg  30 mg Oral Q2H   ??? aspirin chewable tablet 81 mg  81 mg Oral DAILY    Or    ??? aspirin (ASA) suppository 300 mg  300 mg Rectal DAILY   ??? ELECTROLYTE REPLACEMENT PROTOCOL  1 Each Other Q8H   ??? ELECTROLYTE REPLACEMENT PROTOCOL  1 Each Other Q8H   ??? ELECTROLYTE REPLACEMENT PROTOCOL  1 Each Other Q8H   ??? PHARMACY INFORMATION NOTE  1 Each Other DAILY   ??? ondansetron (ZOFRAN) injection 1 mg  1 mg IntraVENous Q6H PRN   ??? acetaminophen (TYLENOL) suppository 650 mg  650 mg Rectal Q4H PRN   ??? senna-docusate (PERICOLACE) 8.6-50 mg per tablet 2 Tab  2 Tab Oral QHS   ??? bisacodyl (DULCOLAX) tablet 5 mg  5 mg Oral DAILY PRN   ??? bisacodyl (DULCOLAX) suppository 10 mg  10 mg Rectal DAILY PRN   ??? magnesium sulfate 2 g/50 ml IVPB (premix or compounded)  2 g IntraVENous Q12H   ??? albuterol (PROVENTIL VENTOLIN) nebulizer solution 2.5 mg  2.5 mg Nebulization Q4H PRN   ??? 0.9% sodium chloride infusion  5 mL/hr IntraVENous CONTINUOUS   ??? sodium chloride (NS) flush 10 mL  10 mL InterCATHeter PRN   ??? sodium chloride (NS) flush 10-40 mL  10-40 mL InterCATHeter Q8H   ??? sodium  chloride (NS) flush 10-30 mL  10-30 mL InterCATHeter PRN   ??? bacitracin 500 unit/gram packet 1 Packet  1 Packet Topical PRN       ASSESSMENT:  1. Pancreatitis : Suspect this is medication induced or secondary to mild ischemia. CT shows no evidence of pancreatitis, Small amount of GB sludge. LFT's normal. Pt is asymptomatic. Tolerating soft diet.  2. SAH s/p embolization   3. Fever On broad spectrum antibiotics. ? secondary to [facility] versus drug induced fever  RECOMMENDATIONS:   1. Continue current diet   2. Avoid NSAID's if possible   3. Cont antibiotics per ID   4. Monitor Lipase       Elberta Spaniel, MD

## 2014-12-04 NOTE — Progress Notes (Signed)
INFECTIOUS DISEASE FOLLOW UP NOTE :-     Admit Date: 11/28/2014     Current  Prior    Vanco, Meropenem 5/30-6      ASSESSMENT: -> RECS     Fever - since admission 5/30  - ?etiology: ?SIRS suspect from [facility], w/ elevated lipase 2008 ?pancreatitis, less likely drug fever  - blcx 5/31, urcx, CSF cx NGTD  - pyuria but urcx 5/30 NG, no pneumonia  -  CXR appear more fluid overload rather than infiltrates  - Lines and EVD very new, GPR periph bctx likely contam  -ua 0-3 wbc 0.5% eos, significance unclear without rash or aki   -6/1 CTH sphenoid sinus AFL likely d/t intubation now extubated and on bsabx   -rare enterobacter in sputum cxr neg and on meropenem  -pvl neg dvt LE 5/31 and NEW 6/5 also neg -complicated  ->monitor on current abx- streamline or stop in 1-2 days based on cultures/ response (or lack of)   biochem pancreatitis pancreas grossly nl on CT   -lipase sl better 2000->1500  -T and wbc sl lower today also ->appreciate GI input   Mild leukocytosis??  - 15->14k  today  -> monitor??   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   Resp failure- s/p intubation -5/30?? - per others??   H/o DVT on coumadin  - currently on hold?? 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   Hypothyroiod?? ??           MICROBIOLOGY:   CSF 5/31 ng, 6/2-5 ngtd  5/30  ucx ntd   rpr nr   5/31  Blcx x2 NTD,           Ucx neg           resp cx rare Enterobacter, CF and C albicans  6/3 bctx 1 of 2 bacillus sp    uctx ngtd  6/4 bctx x 2 ngtd    LINES AND CATHETERS:   EVD 5/30  PICC 5/30    SUBJECTIVE :     Interval notes reviewed. Appreciate GI input, pancreatitis but lipase better; bctx was bacillus sp as suspected, consider not significant unless multiple other cultures positive, not the case so far       Pt denied h/a says T better on cooling blanket no rash sob n/v/d reported. D/w her monitor on current abx pending repeat cultures.  - csf remains neg.     Current Facility-Administered Medications   Medication Dose Route Frequency   ??? dextrose 5% - 0.9% NaCl with KCl 20 mEq/L infusion  0-40 mL/hr IntraVENous CONTINUOUS   ??? albuterol-ipratropium (DUO-NEB) 2.5 MG-0.5 MG/3 ML  3 mL Nebulization Q8H RT   ??? VANCOMYCIN INFORMATION NOTE   Other ONCE   ??? ibuprofen (MOTRIN) tablet 600 mg  600 mg Oral Q6H   ??? Thiamine Mononitrate (B-1) tablet 100 mg  100 mg Oral DAILY   ??? iron sucrose (VENOFER) 200 mg in 0.9% sodium chloride 100  mL IVPB  200 mg IntraVENous Q24H   ??? therapeutic multivitamin (THERAGRAN) tablet 1 Tab  1 Tab Oral DAILY   ??? folic acid (FOLVITE) tablet 1 mg  1 mg Oral DAILY   ??? famotidine (PEPCID) tablet 20 mg  20 mg Oral Q12H   ??? famotidine (PF) (PEPCID) 20 mg in sodium chloride 0.9 % 10 mL injection  20 mg IntraVENous Q12H   ??? vancomycin (VANCOCIN) 1,500 mg in 0.9% sodium chloride 150 mL IVPB  1,500 mg IntraVENous Q8H   ??? 0.9% sodium chloride infusion 250 mL  250 mL IntraVENous PRN   ??? 0.9% sodium chloride infusion 250 mL  250 mL IntraVENous PRN   ??? acetaminophen (TYLENOL) tablet 650 mg  650 mg Oral Q4H PRN   ??? meropenem (MERREM) 1 g in 0.9% sodium chloride (MBP/ADV) 50 mL MBP  1 g IntraVENous Q8H   ??? niMODipine (NIMOTOP) capsule 30 mg  30 mg Oral Q2H   ??? aspirin chewable tablet 81 mg  81 mg Oral DAILY    Or   ??? aspirin (ASA) suppository 300 mg  300 mg Rectal DAILY    ??? ELECTROLYTE REPLACEMENT PROTOCOL  1 Each Other Q8H   ??? ELECTROLYTE REPLACEMENT PROTOCOL  1 Each Other Q8H   ??? ELECTROLYTE REPLACEMENT PROTOCOL  1 Each Other Q8H   ??? PHARMACY INFORMATION NOTE  1 Each Other DAILY   ??? ondansetron (ZOFRAN) injection 1 mg  1 mg IntraVENous Q6H PRN   ??? acetaminophen (TYLENOL) suppository 650 mg  650 mg Rectal Q4H PRN   ??? senna-docusate (PERICOLACE) 8.6-50 mg per tablet 2 Tab  2 Tab Oral QHS   ??? bisacodyl (DULCOLAX) tablet 5 mg  5 mg Oral DAILY PRN   ??? bisacodyl (DULCOLAX) suppository 10 mg  10 mg Rectal DAILY PRN   ??? magnesium sulfate 2 g/50 ml IVPB (premix or compounded)  2 g IntraVENous Q12H   ??? albuterol (PROVENTIL VENTOLIN) nebulizer solution 2.5 mg  2.5 mg Nebulization Q4H PRN   ??? 0.9% sodium chloride infusion  5 mL/hr IntraVENous CONTINUOUS   ??? sodium chloride (NS) flush 10 mL  10 mL InterCATHeter PRN   ??? sodium chloride (NS) flush 10-40 mL  10-40 mL InterCATHeter Q8H   ??? sodium chloride (NS) flush 10-30 mL  10-30 mL InterCATHeter PRN   ??? bacitracin 500 unit/gram packet 1 Packet  1 Packet Topical PRN          OBJECTIVE     BP 162/100 mmHg   Pulse 111   Temp(Src) 99.3 ??F (37.4 ??C)   Resp 27   Ht 5\' 6"  (1.676 m)   Wt 104.5 kg (230 lb 6.1 oz)   BMI 37.20 kg/m2   SpO2 99%    Temp (24hrs), Avg:100 ??F (37.8 ??C), Min:98.2 ??F (36.8 ??C), Max:101.2 ??F (38.4 ??C)    GEN - pleasant [age]yr old [place] female awake and conversing in nicu a bit shaky on cooling blanket  HEENT: R ventric site dry serosang fluid in drain, no thrush  RESP- no crackles or wheezes  CVS- s1s2 normal, no murmur   ABD- soft, NT, BS present  EXT- no edema   SKIN - no rash   NEURO - , awake, answers appropriately     Labs: Results:   Chemistry Recent Labs      12/04/14   0345  12/03/14   2200  12/03/14   1230  12/03/14   0330  12/02/14   0405   GLU  106*   --    --   112*  93   NA  146*   --    --   143  148*   K  4.2  3.6  3.9  3.7  4.3   CL  112*   --    --   108  115*   CO2  24   --    --   25  23    BUN  12   --    --   12  10   CREA  0.58*   --    --   0.67  0.69   CA  8.1*   --    --   8.1*  8.1*   AGAP  10   --    --   10  10   BUCR  21*   --    --   18  14   AP  105   --    --   104  107   TP  7.3   --    --   7.4  6.9   ALB  2.7*   --    --   2.6*  2.5*   GLOB  4.6*   --    --   4.8*  4.4*   AGRAT  0.6*   --    --   0.5*  0.6*      CBC w/Diff Recent Labs      12/04/14   0345  12/03/14   0330  12/02/14   0405   WBC  14.0*  15.4*  12.6   RBC  3.93*  3.86*  3.61*   HGB  11.2*  11.0*  10.3*   HCT  33.9*  33.1*  30.7*   PLT  157  137  128*   GRANS   --   79*   --    LYMPH   --   10*   --    EOS   --   1   --       5/30 echo IMPRESSIONS:  There was no significant valvular heart disease.    6/5 pvl Right Leg:-  Deep venous thrombosis:???????????????????? No  Superficial venous thrombosis:?????? No  Deep venous insufficiency:?????????????? No  Superficial venous insufficiency: No  ??  Left Leg:-  Deep venous thrombosis:???????????????????? No  Superficial venous thrombosis:?????? No  Deep venous insufficiency:?????????????? No  Superficial venous insufficiency: No    RADIOLOGY :    CXR 6/1 -    [place] 6/3 IMPRESSION:  ??  ??  1. Increased echogenicity of the liver which can be seen with hepatic steatosis  or hepatocellular disease. Exam is otherwise unremarkable.    6/4 CT IMPRESSION:  PANCREAS: Grossly normal  ??  1.?? Streak artifact degrades images. No definite evidence of pancreatitis.  2. Large hiatal hernia with bilateral lower lobe areas of scarring versus  atelectasis.  3. Probable gallbladder sludge without obvious stones. No evidence of acute  cholecystitis.  4. Nonobstructing left nephrolithiasis.  5. Fibroid uterus.  6. No evidence of bowel obstruction. Normal appendix.  7. Induration of the subcutaneous fat bilateral inguinal regions. This could  represent scarring however hematomas from recent vascular procedures could cause  similar appearance. No large hematoma.      Berenice Bouton, MD  December 04, 2014  [facility]   [phone]

## 2014-12-04 NOTE — Progress Notes (Signed)
Neurovascular Progress Note      Patient: Renee West MRN: [id number]  SSN: [id number]    Date of Birth: 02/25/1956  Age: 59 y.o.  Sex: female      Events  Neurologically stable.    Blood pressure autoregulating.    Fever lower while on external cooling system and two antipyretics.    Vital Signs  Patient Vitals for the past 24 hrs:   Temp Pulse Resp BP SpO2   12/04/14 0600 (!) 100.7 ??F (38.2 ??C) (!) 107 29 (!) 176/91 mmHg 99 %   12/04/14 0500 100.1 ??F (37.8 ??C) 100 25 (!) 172/108 mmHg 98 %   12/04/14 0450 - - - - 99 %   12/04/14 0400 99.8 ??F (37.7 ??C) 90 29 (!) 165/92 mmHg 99 %   12/04/14 0300 99.2 ??F (37.3 ??C) 100 25 (!) 156/94 mmHg 99 %   12/04/14 0200 99.6 ??F (37.6 ??C) 92 27 168/81 mmHg 95 %   12/04/14 0100 99.9 ??F (37.7 ??C) 92 27 (!) 173/93 mmHg 98 %   12/04/14 0018 98.2 ??F (36.8 ??C) - - - 98 %   12/04/14 0000 99.8 ??F (37.7 ??C) 93 25 162/81 mmHg 99 %   12/03/14 2300 99.6 ??F (37.6 ??C) 95 24 157/81 mmHg 100 %   12/03/14 2200 99.6 ??F (37.6 ??C) 88 25 148/88 mmHg 95 %   12/03/14 2105 - - - - 95 %   12/03/14 2100 99.8 ??F (37.7 ??C) 96 24 134/59 mmHg 97 %   12/03/14 2030 - (!) 102 23 (!) 126/97 mmHg 98 %   12/03/14 2001 98.4 ??F (36.9 ??C) - - - -   12/03/14 2000 100 ??F (37.8 ??C) 95 26 148/90 mmHg 94 %   12/03/14 1930 - 99 30 147/84 mmHg 93 %   12/03/14 1900 100 ??F (37.8 ??C) 99 22 139/86 mmHg 96 %   12/03/14 1830 - 93 29 150/85 mmHg 95 %   12/03/14 1800 100.4 ??F (38 ??C) 95 22 (!) 134/97 mmHg 96 %   12/03/14 1730 100 ??F (37.8 ??C) 90 (!) 31 155/87 mmHg 97 %   12/03/14 1700 100.2 ??F (37.9 ??C) 96 24 152/83 mmHg 96 %   12/03/14 1630 100.2 ??F (37.9 ??C) 100 20 143/84 mmHg 96 %   12/03/14 1518 (!) 100.6 ??F (38.1 ??C) - - - -   12/03/14 1500 - 90 24 (!) 149/117 mmHg 96 %   12/03/14 1446 100 ??F (37.8 ??C) - - - -   12/03/14 1400 - 91 25 148/86 mmHg 96 %   12/03/14 1330 99.7 ??F (37.6 ??C) 84 24 161/82 mmHg 96 %   12/03/14 1300 99.3 ??F (37.4 ??C) 82 24 154/83 mmHg 97 %   12/03/14 1230 - 83 26 142/79 mmHg 96 %    12/03/14 1200 99.1 ??F (37.3 ??C) 96 23 (!) 142/98 mmHg 98 %   12/03/14 1130 - 78 23 143/85 mmHg 98 %   12/03/14 1100 99.3 ??F (37.4 ??C) (!) 101 26 132/86 mmHg 96 %   12/03/14 1000 99.5 ??F (37.5 ??C) 86 (!) 34 143/87 mmHg 94 %   12/03/14 0900 99.7 ??F (37.6 ??C) 86 29 150/85 mmHg 96 %   12/03/14 0830 99.5 ??F (37.5 ??C) 85 26 (!) 165/98 mmHg 97 %   12/03/14 0800 99.5 ??F (37.5 ??C) 78 27 152/86 mmHg 99 %   12/03/14 0730 99.7 ??F (37.6 ??C) 86 23 160/86 mmHg 97 %     ICP  10-15  with EVD 15cm open    NIHSS Flow Sheet  Total: 1 (12/04/14 0600)       Intake and Output    Intake/Output Summary (Last 24 hours) at 12/04/14 0704  Last data filed at 12/04/14 0620   Gross per 24 hour   Intake   1840 ml   Output   2522 ml   Net   -682 ml     CSF drainage:  232 ml over 24 hours    Data  CT abd no evidence of pancreatitis.   Left non-obstructive uroloithiasis.  Recent Results (from the past 24 hour(s))   VANCOMYCIN, TROUGH    Collection Time: 12/03/14  7:55 AM   Result Value Ref Range    Vancomycin,trough 17.5 10.0 - 20.0 ug/mL    Reported dose date: [id number]      Reported dose time: 100      Reported dose: 1.5G UNITS   POTASSIUM    Collection Time: 12/03/14 12:30 PM   Result Value Ref Range    Potassium 3.9 3.5 - 5.5 mmol/L   OCCULT BLOOD, STOOL    Collection Time: 12/03/14  7:10 PM   Result Value Ref Range    Occult blood, stool NEGATIVE  NEG     POTASSIUM    Collection Time: 12/03/14 10:00 PM   Result Value Ref Range    Potassium 3.6 3.5 - 5.5 mmol/L   PHOSPHORUS    Collection Time: 12/03/14 10:00 PM   Result Value Ref Range    Phosphorus 2.6 2.5 - 4.9 MG/DL   CBC W/O DIFF    Collection Time: 12/04/14  3:45 AM   Result Value Ref Range    WBC 14.0 (H) 4.6 - 13.2 K/uL    RBC 3.93 (L) 4.20 - 5.30 M/uL    HGB 11.2 (L) 12.0 - 16.0 g/dL    HCT 33.9 (L) 35.0 - 45.0 %    MCV 86.3 74.0 - 97.0 FL    MCH 28.5 24.0 - 34.0 PG    MCHC 33.0 31.0 - 37.0 g/dL    RDW 14.7 (H) 11.6 - 14.5 %    PLATELET 157 135 - 420 K/uL    MPV 10.9 9.2 - 11.8 FL    PROTHROMBIN TIME + INR    Collection Time: 12/04/14  3:45 AM   Result Value Ref Range    Prothrombin time 14.1 11.5 - 15.2 sec    INR 1.1 0.8 - 1.2     PTT    Collection Time: 12/04/14  3:45 AM   Result Value Ref Range    aPTT 31.2 24.6 - 47.4 SEC   METABOLIC PANEL, BASIC    Collection Time: 12/04/14  3:45 AM   Result Value Ref Range    Sodium 146 (H) 136 - 145 mmol/L    Potassium 4.2 3.5 - 5.5 mmol/L    Chloride 112 (H) 100 - 108 mmol/L    CO2 24 21 - 32 mmol/L    Anion gap 10 3.0 - 18 mmol/L    Glucose 106 (H) 74 - 99 mg/dL    BUN 12 7.0 - 18 MG/DL    Creatinine 0.58 (L) 0.6 - 1.3 MG/DL    BUN/Creatinine ratio 21 (H) 12 - 20      GFR est AA >60 >60 ml/min/1.10m    GFR est non-AA >60 >60 ml/min/1.737m   Calcium 8.1 (L) 8.5 - 10.1 MG/DL   CALCIUM, IONIZED    Collection Time: 12/04/14  3:45  AM   Result Value Ref Range    Ionized Calcium 1.14 1.12 - 1.32 MMOL/L   MAGNESIUM    Collection Time: 12/04/14  3:45 AM   Result Value Ref Range    Magnesium 2.2 1.8 - 2.4 mg/dL   PHOSPHORUS    Collection Time: 12/04/14  3:45 AM   Result Value Ref Range    Phosphorus 2.5 2.5 - 4.9 MG/DL   AMMONIA    Collection Time: 12/04/14  3:45 AM   Result Value Ref Range    Ammonia 29 11 - 32 UMOL/L   AMYLASE    Collection Time: 12/04/14  3:45 AM   Result Value Ref Range    Amylase 145 (H) 25 - 115 U/L   LIPASE    Collection Time: 12/04/14  3:45 AM   Result Value Ref Range    Lipase 1475 (H) 73 - 393 U/L   HEPATIC FUNCTION PANEL    Collection Time: 12/04/14  3:45 AM   Result Value Ref Range    Protein, total 7.3 6.4 - 8.2 g/dL    Albumin 2.7 (L) 3.4 - 5.0 g/dL    Globulin 4.6 (H) 2.0 - 4.0 g/dL    A-G Ratio 0.6 (L) 0.8 - 1.7      Bilirubin, total 0.8 0.2 - 1.0 MG/DL    Bilirubin, direct 0.3 (H) 0.0 - 0.2 MG/DL    Alk. phosphatase 105 45 - 117 U/L    AST 23 15 - 37 U/L    ALT 24 13 - 56 U/L        Physical Exam  Sitting comfortably in bed. Alerts to voice. Conversant. VFFTC. Full EOM  without nystagmus. Left face slightly slower than right. Normal tone. Near full strength. Mild shivering but no drift or gross dysmetria.  Anicteric. Mild meningismus.  CV regular. Abd soft, NT. Right groin mild tenderness but no hematoma. SCDs. Right arm PICC. Distal pulses 1+.     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     1.) Neurologic: Neuro exam stable without clinical signs of symptomatic vasospasm. CSF diversion continues going into peak cerebral vasospasm period then will wean after.  Maintain euthermia in vasospasm period as outcomes worse when febrile.       Aspirin to decrease risk of thromboemboli from devices used to sacrifice LVA.  There may have already been some emboli to [facility] territory on CT.      Repeat cerebral angiography to assess durability of LVA sacrifice and assess/treat vasospasm on Monday or sooner if symptoms.  CT preprocedure or for neuro changes.    SBP autoregulate.  Nimodipine for 21 days; dose split every 2 hours.  Maintin euvolemia. Watch for cerebral salt wasting.    PT/OT at bed level.     2.) Cardiovascular: SBP autoregulate and maintain euvolemia from HHE.     3.) Pulmonary: Fair oxygenation on nasal cannula.  Decrease neb frequency and as needed.    4.) Endocrine: Insulin scale none given; so stopped.  Hypothyroidism therapy held as initial TFT suggested hyperthyroidism.  Repeat TFT also low TSH with adequate fT3 and fT4.  Recheck TFTs in 2 days and restart levothyroxine as indicated.    5.) Hematology: Hgb now at goal after transfuse and iv iron.  D-dimer elevated above assay.  Platelets continue to improve after nadir 3 days ago; fibrinogen elevated.  INR normalized.   Previous LE [place] no DVT but will recheck on Monday given fever and high risk.  SCDs. No pharmacologic DVT  prophylaxis or treatment for now as may need manipulation of CSF diversion hardware.      6.) Fluids, Electrolytes, and Nutrition: Normotonic hydration; may need hypertonic if cerebral edema is problematic. Vitamins given.     7.) Gastrointestinal: Lipase decreasing but still elevatedn without LFT changes.  RUQ [place] and CT unrevealing. Appreciate input from Dr. Lu Duffel.   PPI switched to H2 blocker as some reports of pancreatitis from PPI.  Minimize other medications that might be problematic as indicated.  Bowel regimen. Stool hemoccult negative so far; continue to check as iron deficiency anemia while on warfarin.    8.) LTD: Foley continues for close I/O.  PICC continues but may need to be replaced as below.    9.) ID: Fever better on two antipyretics and external cooling system.  Input from Dr. Kellie Moor appreciated.          The BCx with GPR is labeled peripheral under special requests but also labeled PICC distal port under comments; thus, it is unclear from where it was acquired.  It is more concerning if from PICC. Likewise, the concomitant sample drawn at the same time is labeled PICC under special requests and peripheral under comments but it has remained no growth as have repeat BCx sent 24 hours later. If other BCx positive and fever persists on antimicrobials may need to replace PICC.  CSF studies unrevealing so far. U/A UCx unrevealing so far.    PCXR improving while mini BAL had Enterobacter aerogenes and Candida albicans. Broad spectrum antibiotics to penetrate CNS with EVD in place until culture data and response to therapy can guide adjustment.      Lipase improving without CT evidence of pancreatitis.  If infectious meropenem would be appropriate.  Chemical causes trying to be minimized.    Urine EOS positive consistent with drug reaction or cholesterol emboli syndrome.  No changes in eGFR, urine sediment, or peripheral stigmata of emboli. Consider possible penicillin sensitivity on meropenem.     D-dimer greatly elevated.  Recheck for DVT.    Antipyretics and external cooling systems to maintain euthermia. Consider endovascular cooling system if refractory.  Repeat studies if temp > 100.5 every 48-72 hours.     35 minutes of neurocritical care provided.    Vira Blanco, MD

## 2014-12-04 NOTE — Progress Notes (Addendum)
2000> Patient assessed. Patient is lying in bed, resting. Patient is A&O x 4. EVD at 15 and open. Patient denies having any pain or discomfort at this time.  No further changes or complaints. Call bell within reach. Bed is locked in low position. Side rails up x 3. Will continue to monitor.  0000> Patient is having episodes of Sick Sinus Syndrome ( tachy-brady syndrome). Rhythm recorded and placed to chart. Will notify AM nurse.  0157> Patient off floor to CT. Had episode of VT (19 beats) during transportation. Patient was alert, feeling well. Returned to ST.   0400> No changes in patient's condition. Patient is sleeping. No signs of pain or distress. Will continue to monitor.  0700> Bedside and Verbal shift change report given to [place]boroJeryl[place], Charity fundraiserN (oncoming nurse) by Donald Sivalena Yermak, RN (offgoing nurse). Report included the following information SBAR, Kardex, Intake/Output, MAR and Recent Results.

## 2014-12-04 NOTE — Progress Notes (Signed)
1800 shift note- pt has been pain free, in good spirits, alert. Tylenol and ibuprofen given on schedule for temperature mgmt, goal < 99.5. Cooling blanket on intermittently.

## 2014-12-04 NOTE — Progress Notes (Signed)
[facility]     Pharmacy dosing Vancomycin for treatment of sepsis     Day of Therapy: 5    Other Antibiotics: Meropenem    Labs:   Last Level - 24.2 mcg/ml - drawn @ 7.5 hours after last dose  Bun/Serum Creatinine - 12/0.58  CrCl - > 120 ml/min  WBC - 14  Tmax - 102  I/Os - 1866/2627      Plan: Change Vancomycin to 1500mg  IV q12h for goal trough of 15-20 mcg/ml.  Trough ordered for 12/05/14 at 2000.  Pharmacy to follow and will make changes to dose and/or frequency based on clinical status.    Additional recommendations regarding antibiotic therapy: None

## 2014-12-04 NOTE — Consults (Deleted)
[facility]                  [address], [place], [place]  [id number]                                CONSULTATION REPORT    PATIENT:       Renee West, Renee R  MRN:               [id number]     ADMITTED:   11/28/2014  CSN:               [id number]    CONSULTED:  12/03/2014  LOCATION:  ATTENDING:     Orlena SheldonJOHN ROBERT BAKER, M.D.  CONSULTING:    Elberta SpanielSHOBA P Xiomara Sevillano, MD        GASTROENTEROLOGY CONSULTATION    REQUESTING PHYSICIAN: Dr. Valrie HartJohn Baker.    CONSULTING PHYSICIAN: Elberta SpanielShoba P. Demetri Goshert, MD.    REASON FOR CONSULTATION: Pancreatitis.    HISTORY OF PRESENT ILLNESS: The patient is a pleasant 59 year old African  American female with past medical history of hypothyroidism, DVT,  hypercoagulable state, maintained on warfarin. Was admitted on 11/28/2014  the patient was found unresponsive and combative and was admitted for  subarachnoid hemorrhage.    CT of the head showed diffuse subarachnoid hemorrhage, especially in the  posterior fossa. She had an INR of 2.3 at the time of admission. 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                      Elberta SpanielSHOBA P Rayfield Beem, MD      SPM:wmx  D: 12/04/2014 08:53 A T: 12/04/2014 09:43 A  Job #:  [id number]  CScriptDoc #:  [id number]  cc:   Orlena SheldonJOHN ROBERT BAKER, M.D.        [facility] Nolene EbbsP Xiara Knisley, MD

## 2014-12-05 ENCOUNTER — Inpatient Hospital Stay
Admit: 2014-12-05 | Payer: BLUE CROSS/BLUE SHIELD | Primary: Student in an Organized Health Care Education/Training Program

## 2014-12-05 LAB — METABOLIC PANEL, BASIC
Anion gap: 9 mmol/L (ref 3.0–18)
BUN/Creatinine ratio: 22 — ABNORMAL HIGH (ref 12–20)
BUN: 14 MG/DL (ref 7.0–18)
CO2: 24 mmol/L (ref 21–32)
Calcium: 8.2 MG/DL — ABNORMAL LOW (ref 8.5–10.1)
Chloride: 110 mmol/L — ABNORMAL HIGH (ref 100–108)
Creatinine: 0.64 MG/DL (ref 0.6–1.3)
GFR est AA: >60 mL/min/{1.73_m2} (ref >60)
GFR est non-AA: >60 mL/min/{1.73_m2} (ref >60)
Glucose: 113 mg/dL — ABNORMAL HIGH (ref 74–99)
Potassium: 3.8 mmol/L (ref 3.5–5.5)
Sodium: 143 mmol/L (ref 136–145)

## 2014-12-05 LAB — CELL COUNT, CSF
CSF LYMPH: 19 %
CSF MONO: 8 %
CSF Neutrophils: 73 % — ABNORMAL HIGH (ref 0–6)
CSF WBCs: 90 /mm3 — ABNORMAL HIGH (ref <5)

## 2014-12-05 LAB — CBC W/O DIFF
HCT: 33.5 % — ABNORMAL LOW (ref 35.0–45.0)
HGB: 11.2 g/dL — ABNORMAL LOW (ref 12.0–16.0)
MCH: 28.9 PG (ref 24.0–34.0)
MCHC: 33.4 g/dL (ref 31.0–37.0)
MCV: 86.6 FL (ref 74.0–97.0)
MPV: 10.9 FL (ref 9.2–11.8)
PLATELET: 194 10*3/uL (ref 135–420)
RBC: 3.87 M/uL — ABNORMAL LOW (ref 4.20–5.30)
RDW: 14.5 % (ref 11.6–14.5)
WBC: 12.8 10*3/uL (ref 4.6–13.2)

## 2014-12-05 LAB — HEPATIC FUNCTION PANEL
A-G Ratio: 0.6 — ABNORMAL LOW (ref 0.8–1.7)
ALT (SGPT): 26 U/L (ref 13–56)
AST (SGOT): 28 U/L (ref 15–37)
Albumin: 2.6 g/dL — ABNORMAL LOW (ref 3.4–5.0)
Alk. phosphatase: 104 U/L (ref 45–117)
Bilirubin, direct: 0.3 MG/DL — ABNORMAL HIGH (ref 0.0–0.2)
Bilirubin, total: 0.7 MG/DL (ref 0.2–1.0)
Globulin: 4.5 g/dL — ABNORMAL HIGH (ref 2.0–4.0)
Protein, total: 7.1 g/dL (ref 6.4–8.2)

## 2014-12-05 LAB — CULTURE, BLOOD
Culture result:: NO GROWTH
Culture result:: NO GROWTH

## 2014-12-05 LAB — PHOSPHORUS: Phosphorus: 2.3 MG/DL — ABNORMAL LOW (ref 2.5–4.9)

## 2014-12-05 LAB — PROTHROMBIN TIME + INR
INR: 1.1 (ref 0.8–1.2)
Prothrombin time: 13.8 s (ref 11.5–15.2)

## 2014-12-05 LAB — ANA BY MULTIPLEX FLOW IA, QL
ANA, Direct: POSITIVE — AB
ANA: POSITIVE — AB

## 2014-12-05 LAB — AMYLASE: Amylase: 172 U/L — ABNORMAL HIGH (ref 25–115)

## 2014-12-05 LAB — VANCOMYCIN, TROUGH
Reported dose date: 20160606
Reported dose time:: 900
Vancomycin,trough: 20.9 ug/mL — CR (ref 10.0–20.0)

## 2014-12-05 LAB — GLUCOSE, CSF: Glucose,CSF: 76 MG/DL — ABNORMAL HIGH (ref 40–70)

## 2014-12-05 LAB — GLUCOSE, POC: Glucose (POC): 99 mg/dL (ref 70–110)

## 2014-12-05 LAB — LIPASE: Lipase: 2021 U/L — ABNORMAL HIGH (ref 73–393)

## 2014-12-05 LAB — MAGNESIUM: Magnesium: 2.6 mg/dL — ABNORMAL HIGH (ref 1.8–2.4)

## 2014-12-05 LAB — PTT: aPTT: 33.2 s (ref 24.6–37.7)

## 2014-12-05 LAB — AMMONIA: Ammonia, plasma: 26 umol/L (ref 11–32)

## 2014-12-05 LAB — CALCIUM, IONIZED: Ionized Calcium: 1.13 MMOL/L (ref 1.12–1.32)

## 2014-12-05 MED ORDER — HEPARIN 3,000 UNITS IN NORMAL SALINE 1000 ML
3000 units/1000 | Freq: Once | INTRAVENOUS | Status: AC
Start: 2014-12-05 — End: 2014-12-05
  Administered 2014-12-05: 12:00:00

## 2014-12-05 MED ORDER — VERAPAMIL 2.5 MG/ML IV
2.5 mg/mL | INTRAVENOUS | Status: DC | PRN
Start: 2014-12-05 — End: 2014-12-05
  Administered 2014-12-05 (×2): via INTRA_ARTERIAL

## 2014-12-05 MED ORDER — IODIXANOL 270 MG/ML IV SOLN
270 mg iodine/mL | Freq: Once | INTRAVENOUS | Status: AC
Start: 2014-12-05 — End: 2014-12-05
  Administered 2014-12-05: 12:00:00 via INTRA_ARTERIAL

## 2014-12-05 MED ORDER — D5-NS WITH POTASSIUM CHLORIDE 40 MEQ/L IV
40 mEq/L | INTRAVENOUS | Status: DC
Start: 2014-12-05 — End: 2014-12-05
  Administered 2014-12-05: 10:00:00 via INTRAVENOUS

## 2014-12-05 MED ORDER — POTASSIUM CHLORIDE 20 MEQ/50 ML IV PIGGY BACK
20 mEq/50 mL | Freq: Once | INTRAVENOUS | Status: AC
Start: 2014-12-05 — End: 2014-12-05
  Administered 2014-12-05: 09:00:00 via INTRAVENOUS

## 2014-12-05 MED ORDER — MIDAZOLAM 1 MG/ML IJ SOLN
1 mg/mL | INTRAMUSCULAR | Status: DC | PRN
Start: 2014-12-05 — End: 2014-12-05
  Administered 2014-12-05 (×4): via INTRAVENOUS

## 2014-12-05 MED ORDER — LIDOCAINE HCL 2 % (20 MG/ML) IJ SOLN
20 mg/mL (2 %) | INTRAMUSCULAR | Status: DC | PRN
Start: 2014-12-05 — End: 2014-12-05
  Administered 2014-12-05: 16:00:00 via SUBCUTANEOUS

## 2014-12-05 MED ORDER — D5-NS WITH POTASSIUM CHLORIDE 40 MEQ/L IV
40 mEq/L | INTRAVENOUS | Status: DC
Start: 2014-12-05 — End: 2014-12-08
  Administered 2014-12-05 – 2014-12-08 (×7): via INTRAVENOUS

## 2014-12-05 MED ORDER — POTASSIUM & SODIUM PHOSPHATES 280 MG-160 MG-250 MG ORAL POWDER PACKET
280-160-250 mg | ORAL | Status: AC
Start: 2014-12-05 — End: 2014-12-05
  Administered 2014-12-05 (×2): via ORAL

## 2014-12-05 MED ORDER — PHARMACY VANCOMYCIN NOTE
Freq: Once | Status: DC
Start: 2014-12-05 — End: 2014-12-06

## 2014-12-05 MED ORDER — FENTANYL CITRATE (PF) 50 MCG/ML IJ SOLN
50 mcg/mL | INTRAMUSCULAR | Status: DC | PRN
Start: 2014-12-05 — End: 2014-12-05
  Administered 2014-12-05 (×4): via INTRAVENOUS

## 2014-12-05 MED ORDER — HEPARIN 3,000 UNITS IN NORMAL SALINE 1000 ML
3000 units/1000 | INTRAVENOUS | Status: DC
Start: 2014-12-05 — End: 2014-12-05
  Administered 2014-12-05: 12:00:00 via INTRA_ARTERIAL

## 2014-12-05 MED ORDER — MEROPENEM 1 GRAM IV SOLR
1 gram | INTRAVENOUS | Status: AC
Start: 2014-12-05 — End: 2014-12-05
  Administered 2014-12-05: 15:00:00

## 2014-12-05 MED ORDER — HEPARIN 3,000 UNITS IN NORMAL SALINE 1000 ML
3000 units/1000 | Freq: Once | INTRAVENOUS | Status: AC
Start: 2014-12-05 — End: 2014-12-05
  Administered 2014-12-05: 12:00:00 via INTRAVENOUS

## 2014-12-05 MED ORDER — CALCIUM GLUCONATE 100 MG/ML (10%) IV SOLN
100 mg/mL (10%) | INTRAVENOUS | Status: AC
Start: 2014-12-05 — End: 2014-12-05
  Administered 2014-12-05: 13:00:00

## 2014-12-05 MED FILL — PHOS-NAK 280 MG-160 MG-250 MG ORAL POWDER PACKET: 280-160-250 mg | ORAL | Qty: 2

## 2014-12-05 MED FILL — NIMODIPINE 30 MG CAP: 30 mg | ORAL | Qty: 1

## 2014-12-05 MED FILL — VANCOMYCIN 10 GRAM IV SOLR: 10 gram | INTRAVENOUS | Qty: 1500

## 2014-12-05 MED FILL — MEROPENEM 1 GRAM IV SOLR: 1 gram | INTRAVENOUS | Qty: 1

## 2014-12-05 MED FILL — VISIPAQUE 270 MG IODINE/ML INTRAVENOUS SOLUTION: 270 mg iodine/mL | INTRAVENOUS | Qty: 100

## 2014-12-05 MED FILL — D5-NS WITH POTASSIUM CHLORIDE 40 MEQ/L IV: 40 mEq/L | INTRAVENOUS | Qty: 1000

## 2014-12-05 MED FILL — VERAPAMIL 2.5 MG/ML IV: 2.5 mg/mL | INTRAVENOUS | Qty: 4

## 2014-12-05 MED FILL — IPRATROPIUM-ALBUTEROL 2.5 MG-0.5 MG/3 ML NEB SOLUTION: 2.5 mg-0.5 mg/3 ml | RESPIRATORY_TRACT | Qty: 3

## 2014-12-05 MED FILL — FENTANYL CITRATE (PF) 50 MCG/ML IJ SOLN: 50 mcg/mL | INTRAMUSCULAR | Qty: 4

## 2014-12-05 MED FILL — HEPARIN 3,000 UNITS IN NORMAL SALINE 1000 ML: 3000 units/1000 | INTRAVENOUS | Qty: 1000

## 2014-12-05 MED FILL — THERA 400 MCG TABLET: 400 mcg | ORAL | Qty: 1

## 2014-12-05 MED FILL — MIDAZOLAM 1 MG/ML IJ SOLN: 1 mg/mL | INTRAMUSCULAR | Qty: 2

## 2014-12-05 MED FILL — ASPIRIN 81 MG CHEWABLE TAB: 81 mg | ORAL | Qty: 1

## 2014-12-05 MED FILL — FAMOTIDINE 20 MG TAB: 20 mg | ORAL | Qty: 1

## 2014-12-05 MED FILL — SODIUM CHLORIDE 0.9 % IV PIGGY BACK: INTRAVENOUS | Qty: 50

## 2014-12-05 MED FILL — IBUPROFEN 600 MG TAB: 600 mg | ORAL | Qty: 1

## 2014-12-05 MED FILL — XYLOCAINE 20 MG/ML (2 %) INJECTION SOLUTION: 20 mg/mL (2 %) | INTRAMUSCULAR | Qty: 20

## 2014-12-05 MED FILL — VENOFER 100 MG IRON/5 ML INTRAVENOUS SOLUTION: 100 mg iron/5 mL | INTRAVENOUS | Qty: 10

## 2014-12-05 MED FILL — THIAMINE MONONITRATE 100 MG TABLET: 100 mg | ORAL | Qty: 1

## 2014-12-05 MED FILL — MAGNESIUM SULFATE 2 GRAM/50 ML IVPB: 2 gram/50 mL (4 %) | INTRAVENOUS | Qty: 50

## 2014-12-05 MED FILL — CALCIUM GLUCONATE 100 MG/ML (10%) IV SOLN: 100 mg/mL (10%) | INTRAVENOUS | Qty: 20

## 2014-12-05 MED FILL — VISIPAQUE 270 MG IODINE/ML INTRAVENOUS SOLUTION: 270 mg iodine/mL | INTRAVENOUS | Qty: 200

## 2014-12-05 MED FILL — POTASSIUM CHLORIDE 20 MEQ/50 ML IV PIGGY BACK: 20 mEq/50 mL | INTRAVENOUS | Qty: 50

## 2014-12-05 MED FILL — PHARMACY VANCOMYCIN NOTE: Qty: 1

## 2014-12-05 MED FILL — SENNA PLUS 8.6 MG-50 MG TABLET: ORAL | Qty: 2

## 2014-12-05 MED FILL — TYLENOL 325 MG TABLET: 325 mg | ORAL | Qty: 2

## 2014-12-05 MED FILL — FOLIC ACID 1 MG TAB: 1 mg | ORAL | Qty: 1

## 2014-12-05 NOTE — Procedures (Signed)
[facility]  *** FINAL REPORT ***    Name: Renee West, Renee West  MRN: [id number]    Inpatient  DOB: 10 Nov 1955  HIS Order #: [id number]  TRAKnet Visit #: [id number]  Date: 04 Dec 2014    TYPE OF TEST: Peripheral Venous Testing    REASON FOR TEST  Elevated D-Dimer    Right Leg:-  Deep venous thrombosis:           No  Superficial venous thrombosis:    No  Deep venous insufficiency:        No  Superficial venous insufficiency: No    Left Leg:-  Deep venous thrombosis:           No  Superficial venous thrombosis:    No  Deep venous insufficiency:        No  Superficial venous insufficiency: No      INTERPRETATION/FINDINGS  Duplex images were obtained using 2-D gray scale, color flow, and  spectral Doppler analysis.  Right leg :  1. Deep vein(s) visualized include the common femoral, deep femoral,  proximal femoral, mid femoral, distal femoral, popliteal(above knee),  popliteal(fossa), popliteal(below knee) and posterior tibial veins.  2. No evidence of deep venous thrombosis detected in the veins  visualized.  3. No evidence of superficial thrombosis detected.  4. No evidence of reflux detected in the deep veins visualized.  5. No evidence of reflux detected in the superficial veins visualized.  Left leg :  1. Deep vein(s) visualized include the common femoral, deep femoral,  proximal femoral, mid femoral, distal femoral, popliteal(above knee),  popliteal(fossa), popliteal(below knee) and posterior tibial veins.  2. No evidence of deep venous thrombosis detected in the veins  visualized.  3. No evidence of superficial thrombosis detected.  4. No evidence of reflux detected in the deep veins visualized.  5. No evidence of reflux detected in the superficial veins visualized.    ADDITIONAL COMMENTS    I have personally reviewed the data relevant to the interpretation of  this  study.    TECHNOLOGIST: Francee Piccoloonna Blanchard, RVS  Signed: 12/04/2014 08:39 AM    PHYSICIAN: Modena SlaterFrederick N. Xavier Fournier, MD  Signed: 12/05/2014 01:15  AM

## 2014-12-05 NOTE — Progress Notes (Signed)
Pt as assessed, no problems with swallowing including the large nimotopp, will talk with speech for re-evaluation of diet.    Pt with NIHSS of 1 which has not changed, Pt to Neuro suite at 0845 with 3 RNs on monitor,     I relayed information to Robin and Angie Banker(RN) to share with Dr. Excell SeltzerBaker,  about patients cardiac status, Pt having tachy brady runs as evidenced by strips included in chart and shown to aforementioned RN's.    Pt back from Neuro Suite at 1245, Awake alert with EVD remaining at 15.  Right groin access point without any hematoma or bleeding and pedal pulses +      Pt with short run <20 sec of svt 160s, b/p stable but pt c/o of being dizzy, I spoke with Dr. Excell SeltzerBaker and told him. He wanted me to check cxr for picc placement and I did.      Pt with two more episodes of rapid H/R 130-150's, I spoke with Dr. Excell SeltzerBaker and he ordered a stat cxr with stat read for PiCC placement. Done at 1850    Report given to next shift.

## 2014-12-05 NOTE — Progress Notes (Addendum)
Problem: Dysphagia (Adult)  Goal: *Acute Goals and Plan of Care (Insert Text)  Dysphagia Present: No     Aspiration: No    Recommendations:  Diet: mech soft, chopped/thin liquid  Meds: whole one at a time, with liquid  Aspiration Precautions  Oral Care TID  Other: MBS when medically appropriate    Goals: Patient will:  1. Tolerate PO trials with 0 s/s overt distress in 4/5 trials  2. Utilize compensatory swallow strategies/maneuvers (decrease bite/sip, size/rate, alt. liq/sol) with min cues in 4/5 trials  3. Perform oral-motor/laryngeal exercises to increase oropharyngeal swallow function with min cues  4. Complete an objective swallow study (i.e., MBSS) to assess swallow integrity, r/o aspiration, and determine of safest LRD, min A  Outcome: Progressing Towards Goal  SPEECH LANGUAGE PATHOLOGY DYSPHAGIA TREATMENT    Patient: Renee West ([age] y.o. female)  Date: 12/05/2014  Diagnosis: SAH (subarachnoid hemorrhage) (HCC) SAH (subarachnoid hemorrhage) (HCC)       Precautions: aspiration risk,  Fall (bedrest)      ASSESSMENT:  Pt seen at bedside this pm for [facility]phagia tx[facility] orders. Pt accepted thin liquid trials (+) straw and solid cracker trials with 0 s/s of aspiration. No s/s of aspiration when liquid used as solid wash. Pt required use of straw and SLP feeding due to restricted positioning in bed. SLP recommending diet advancement to mech-soft, chopped with thin liquids. Educated pt on aspiration precautions and importance of compensatory swallow techniques to decrease aspiration risk (decrease rate of intake & sip/bite size, upright @HOB  for all po intake and ~30 minutes after po); verbalized comprehension. Results of tx shared with RN, Linford ArnoldJerilynn    Progression toward goals:  [ ]          Improving appropriately and progressing toward goals  [X]          Improving slowly and progressing toward goals  [ ]          Not making progress toward goals and plan of care will be  adjusted       PLAN:  Recommendations and Planned Interventions:  Mech-soft, chopped/thin liquid diet  Patient continues to benefit from skilled intervention to address the above impairments. Continue treatment per established plan of care.  Discharge Recommendations:  Rehab       SUBJECTIVE:   Patient stated ???That's so good???.      OBJECTIVE:   Cognitive and Communication Status:  Neurologic State: Alert  Orientation Level: Oriented X4  Cognition: Follows commands  Perception: Appears intact  Perseveration: No perseveration noted  Safety/Judgement: Awareness of environment  Dysphagia Treatment:  Oral Assessment:  Oral Assessment  Labial: No impairment  Dentition: Natural  Oral Hygiene: good  Lingual: No impairment  Velum: No impairment  Mandible: No impairment  P.O. Trials:              Patient Position: HOB 30 restriction              Vocal quality prior to P.O.: Low volume              Consistency Presented: Thin liquid, Solid              How Presented: Straw, Successive swallows, SLP-fed/presented                             Bolus Acceptance: No impairment  Bolus Formation/Control: Impaired              Type of Impairment: Spillage, Anterior (most likely due to pt restricted position in bed (only 1 x))              Propulsion: No impairment              Oral Residue: None              Initiation of Swallow: No impairment              Laryngeal Elevation: Decreased              Aspiration Signs/Symptoms: None              Pharyngeal Phase Characteristics: Easily fatigued , Poor endurance              Effective Modifications: Small sips and bites              Cues for Modifications: Minimal                               Oral Phase Severity: No impairment              Pharyngeal Phase Severity : No impairment                PAIN:  Start of Tx: 0  End of Tx: 0     After treatment:                 Patient left in no apparent distress sitting up in chair                 Patient left in no apparent distress in bed                Call bell left within reach                Nursing notified                Caregiver present                Bed alarm activated        COMMUNICATION/EDUCATION:                 Aspiration precautions; compensatory swallow techniques    Courtney Heys  Time Calculation: 30 mins

## 2014-12-05 NOTE — Other (Signed)
Patient Name HAR Sex DOB SSN Address Phone     Renee West, [name] [id number] Female 23-Jan-1956 [phone] [address]  [place] [phone] (Home)        CSN:     [id number]        Admit Date: Admit Time Room Bed     Nov 28, 2014 2:25 AM NL [[id number]][[id number]] PL [[id number]]        Attending Providers      Provider Pager From To     Laurin CoderAlina M Schmidt, DO  11/28/14 11/28/14     Andrey CotaJohn R Baker, MD  11/28/14         Emergency Contact(s)      Name Relation Home Work PalmerMobile     Scales,Mary R Sister [phone]         Utilization Review          Neurology [facility] - Care Day 8 (12/05/2014) by Stan Headarole Rountree      Review Entered Review Status     12/05/2014 Completed     Details          Care Day: 8 Care Date: 12/05/2014 Level of Care:      Guideline Day 2      Clinical Status     ( ) * No ICU or intermediate care needs          Interventions     (X) Inpatient interventions continue          12/05/2014 11:10 AM EDT by Stan Headountree, Carole     Subject: Additional Clinical Information     Remains in ICU    99.5-109-20, 161/91, sats 93% ra    H&H 11.2/33.5, Cl 110, Gluc 113, Bun/Cr ratio 22, Ca 8.2, Phos 2.3, Mg 2.6, D Bili 0.3, Alb 2.6, Glob 4.5, Amylase 172, Lipase 2021, CSF cx no growth in 2 days, Blood cx no growth in 2 days          Pureed diet w/honey thick liquids, daily labs, Pulse ox continuous, OT/PT/SLP, cooling blanket for temp >99.5, Elevate HOB, Neuro checks q1h, SCD'S, Ventric drain 15cm        D5NS w.40 Kcl 40/hr, DuoNeb q8, ASA, Ca Gluc IV x1, Pepcid, Folvite, Motrin po q6, Venofer 200mg  IV q24h, MgSO4 2gms IV q12, Merrem IV q8, Nimotop po q2, Pericolace, B-1, Vancomycin IV q12, Kcl 20meq IV x1, Na Phos 2 packets x2, Tylenol 650mg  po q4 prn (x1 so far)      - repeat cerebral angiography today to assess durability of LVA sacrifice and assess vasospasm                         * Milestone

## 2014-12-05 NOTE — Progress Notes (Addendum)
2000  assumed care of patient EVD at 15 and open.  alert and oriented, SBP elevated but less than 220 will continue to monitor.  patient is restless and asking to get out of bed    2200  patient had an episode of SVT    2330  cooling blanket turned off. will continue to monitor    0000  skin tear present at top of gluteal fold.  patient had a small soft bowel movement. specimen sent to lab for occult blood. patient is becoming increasingly restless has removed her blood pressure cuff and pulse ox multiple times    0200  patient continues to make frequent position changes without calling first for assistance. bed controls locked will continue to monitor. no output from EVD for the shift so far.    0400  temp 99.3 cooling blanket turned back on.  patient continues to have episodes of SVT, asymptomatic states she does not feel any different during the episodes    stool specimen positive for occult blood.  patient had three consecutive SBP readings above 200 (224/110, 218/106, 206/108)   6:22 AM  paged Dr. Excell SeltzerBaker to notify of blood pressure  6:34 AM  blood pressure now 168/93 will continue to monitor    6:43 AM  called lab to inquire about cardiac enzymes, T3, T4, TSH    EKG Results     Procedure 720 Value Units Date/Time    EKG, 12 LEAD, SUBSEQUENT [[id number]][[id number]] Collected:  12/06/14 0656    Order Status:  Completed Updated:  12/06/14 0658     Ventricular Rate 118 BPM      Atrial Rate 118 BPM      P-R Interval 126 ms      QRS Duration 82 ms      Q-T Interval 362 ms      QTC Calculation (Bezet) 507 ms      Calculated P Axis 40 degrees      Calculated R Axis 28 degrees      Calculated T Axis 39 degrees      Diagnosis --      Result:        Poor data quality, interpretation may be adversely affected  Sinus tachycardia  Possible Left atrial enlargement  Left ventricular hypertrophy  Abnormal ECG  When compared with ECG of 29-Nov-2014 09:31,  Vent. rate has increased BY  55 BPM   Nonspecific T wave abnormality now evident in Inferior leads  Nonspecific T wave abnormality now evident in Anterolateral leads      SCANNED CARDIAC RHYTHM STRIP [[id number]] Collected:  11/28/14 1635    Order Status:  Completed Updated:  12/04/14 1635    SCANNED CARDIAC RHYTHM STRIP [[id number]] Collected:  11/28/14 1003    Order Status:  Completed Updated:  12/02/14 1003    SCANNED CARDIAC RHYTHM STRIP [[id number]] Collected:  11/28/14 1023    Order Status:  Completed Updated:  12/01/14 1023    EKG, 12 LEAD, SUBSEQUENT [[id number]][[id number]] Collected:  11/29/14 0931    Order Status:  Completed Updated:  12/01/14 0844     Ventricular Rate 63 BPM      Atrial Rate 63 BPM      P-R Interval 178 ms      QRS Duration 94 ms      Q-T Interval 514 ms      QTC Calculation (Bezet) 525 ms      Calculated P Axis 16 degrees      Calculated R Axis 39 degrees  Calculated T Axis 54 degrees      Diagnosis --      Result:        Normal sinus rhythm  Prolonged QT  Abnormal ECG  When compared with ECG of 28-Nov-2014 17:01,  Vent. rate has decreased BY  40 BPM  Confirmed by Merdis Delay ([id number]) on 12/01/2014 8:44:21 AM      SCANNED CARDIAC RHYTHM STRIP [[id number]] Collected:  11/28/14 1117    Order Status:  Completed Updated:  11/30/14 1117    EKG, 12 LEAD, SUBSEQUENT [[id number]] Collected:  11/28/14 1701    Order Status:  Completed Updated:  11/29/14 1243     Ventricular Rate 103 BPM      Atrial Rate 103 BPM      P-R Interval 136 ms      QRS Duration 84 ms      Q-T Interval 416 ms      QTC Calculation (Bezet) 544 ms      Calculated P Axis 36 degrees      Calculated R Axis 37 degrees      Calculated T Axis 38 degrees      Diagnosis --      Result:        Sinus tachycardia  Nonspecific ST abnormality  When compared with ECG of 28-Nov-2014 01:09,  QRS duration has decreased  Confirmed by Earl Lites [id number]) on 11/29/2014 12:42:58 PM      SCANNED CARDIAC RHYTHM STRIP [[id number]] Collected:  11/28/14 0945     Order Status:  Completed Updated:  11/29/14 0945    EKG, 12 LEAD, SUBSEQUENT [[id number]]     Order Status:  Canceled           Bedside and Verbal shift change report given to Marcie Bal, RN (oncoming nurse) by Leanne Lovely, RN   (offgoing nurse). Report included the following information SBAR, Intake/Output, MAR and Recent Results.

## 2014-12-05 NOTE — Progress Notes (Signed)
Problem: Discharge Planning  Goal: *Discharge to safe environment  Outcome: Progressing Towards Goal  No plans for discharge at this juncture, Case manager involved.

## 2014-12-05 NOTE — Progress Notes (Signed)
Holding FMP today due to procedure. Will notify PT and OT if reassessment is appropriate.     [facility] [employer]   Rehab Tech

## 2014-12-05 NOTE — Brief Op Note (Signed)
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    Multifocal moderate vasospasm improved after the intra-arterial administration of vasodilator.    Complications: none immediate

## 2014-12-05 NOTE — Consults (Signed)
[facility]                  [address], [place], [place]  [id number]                                CONSULTATION REPORT    PATIENT:       Renee West, Renee West  MRN:               [id number]     ADMITTED:   11/28/2014  CSN:               [id number]    CONSULTED:  12/03/2014  LOCATION:  ATTENDING:     Orlena SheldonJOHN ROBERT BAKER, M.D.  CONSULTING:    Elberta SpanielSHOBA P Zaley Talley, MD        GASTROENTEROLOGY CONSULTATION    REQUESTING PHYSICIAN: Dr. Valrie HartJohn Baker.    CONSULTING PHYSICIAN: Elberta SpanielShoba P. Jennilee Demarco, MD.    REASON FOR CONSULTATION: Pancreatitis.    HISTORY OF PRESENT ILLNESS: The patient is a pleasant 59 year old African  American female with past medical history of hypothyroidism, DVT,  hypercoagulable state, maintained on warfarin. Was admitted on 11/28/2014  the patient was found unresponsive and combative and was admitted for  subarachnoid hemorrhage.    CT of the head showed diffuse subarachnoid hemorrhage, especially in the  posterior fossa. She had an INR of 2.3 at the time of admission. 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    The patient had an embolization of the aneurysm performed by Dr. Excell SeltzerBaker, and  Dr. Su LeyKoen and then had a ventriculostomy placed. She was noted to have  increased lipase of 2000, and we are consulted because of that. The patient  is currently alert and awake, and is able to provide some history. She  denies any abdominal pain, nausea, vomiting, constipation or diarrhea. She  never had any prior episodes of pancreatitis that she knows about. Does not  have any history of gallstones, never had any peptic ulcer disease. Never  had an endoscopy evaluation before. Currently, she is on a soft diet and is  tolerating it well without any obvious symptoms.    REVIEW OF SYSTEMS  As above, all systems are negative.    PAST MEDICAL HISTORY: Significant for hypothyroidism, DVT, and  hypercoagulable state.     PAST SURGICAL HISTORY: Hysterectomy, thyroidectomy.    ALLERGIES: NO KNOWN DRUG ALLERGIES.    MEDICATIONS  In hospital included:  1. Iron supplements.  2. Folic acid.  3. Vancomycin.  4. Meropenem.  5. Ibuprofen.  6. Famotidine.    She was on PPI, which was discontinued because of the pancreatitis.    FAMILY HISTORY: Noncontributory.    SOCIAL HISTORY: Negative for drinking, smoking or alcohol. She is a retired  Runner, broadcasting/film/videoteacher, [place].    PHYSICAL EXAMINATION  GENERAL: The patient is well-built, well-nourished, appears to be in no  acute distress at the time.  VITALS: On 12/03/2014 at the time of the consultation, she was spiking  low-grade temperatures from 100.9-101.2. Blood pressure was stable,  saturating 99% on 2 liters of nasal cannula, pulse rate was between  90-100.  HEENT: Exam reveals no pallor, no icterus.  NECK: No JVD.  LUNGS: Clear to auscultation anteriorly bilaterally.  HEART: Exam revealed S1, S2 heard normally, regular rate and rhythm.  ABDOMEN: Soft, obese, nontender, nondistended. Bowel sounds are present  in  all the quadrants. No hepatosplenomegaly. No masses palpated.  EXTREMITIES: Reveal no pedal edema.    LABORATORY DATA: On 12/03/2014 included WBC 15.4, hemoglobin is 11.0, MCV  is 85.8, platelets 137. Urinalysis on 12/02/2014 was unremarkable. Sodium  143, potassium 3.7, chloride is 108, bicarbonate is 25, BUN is 12,  creatinine 0.67. ALT 26, AST 25, alkaline phosphatase is 188, lipase 2067.  Ammonia is 44. TSH was 0.19. Free T4 0.9, PT was 2.7. CSF cultures and  blood cultures were negative at the time. Stool occult blood was negative  on 12/03/2014.    IMPRESSION  1. Pancreatitis which is most likely acute. This could be secondary to  medication induced pancreatitis secondary to gallbladder sludge, secondary  to infection or hypotension. The patient is currently asymptomatic. We will  have to rule out any intrinsic pancreatic lesions, as well. Her LFTs are   normal. Ultrasound showed no gallstones or gallbladder abnormalities. No  biliary ductal dilatation.  2. 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  3. History of hypothyroidism.    RECOMMENDATIONS  1. Monitor lipase and amylase.  2. Continue current diet.  3. Recommend CT of the abdomen and pelvis.  4. Recommend to check fasting triglyceride levels.  5. Further recommendations will be made pending the CT results and the  patient's clinical status.  6. Avoid any medications that could potentially cause pancreatitis and  avoid NSAID use if possible.                Added job# [id number]                      Elberta Spaniel, MD      SPM:wmx  D: 12/05/2014 12:36 P T: 12/05/2014 01:12 P  Job #:  [id number]  CScriptDoc #:  [id number]  cc:   Orlena Sheldon, M.D.        [facility] Nolene Ebbs, MD

## 2014-12-05 NOTE — Progress Notes (Signed)
INFECTIOUS DISEASE FOLLOW UP NOTE :-     Admit Date: 11/28/2014     Current  Prior    Vanco/Meropenem 5/30-7      ASSESSMENT: -> RECS     Fever - since admission 5/30  - ?etiology: ?SIRS suspect from [facility], w/ elevated lipase 2008 ?pancreatitis, less likely drug fever  --Lines and EVD new, 1 of mult bctx's bacillus sp, likely contam, serial csf cultures neg so far  -some UA pyuria but uctx 5/30  -CXR 6/4 NAD, rare enterobacter in sputum   -ua 0-3 wbc 0.5% eos, significance unclear without rash or aki   -lipase remains 2000, GI seeing  -6/1 CTH sphenoid sinus AFL likely d/t intubation now extubated and on bsabx  -pvl neg dvt LE 5/31 and 6/5 -no new pos cultures, T better and remain suspicious of central fever, cannot exclude drug fever on same abx now 7 days   ->suggest stop iv abx and monitor, or narrow coverage to NSGY routine if Dr. Excell Seltzer feels systemic abx prophylaxis needed with ventriculostomy        biochem pancreatitis pancreas grossly nl on CT   -lipase little-changed 2000 today  -T and wbc sl lower today  ->appreciate GI input   Mild leukocytosis??  - 15->14k->13k today  -> monitor??   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  -from peripheral, picc same day, prior and subsequent bctx's remain neg ->doubt significant unless multiple positive isolates- typically a skin contaminent  ->monitor repeat bctx's - ngtd   SAH, hydrocephalus- s/p cerebral angiography, right frontal ventriculostomy, LCFV CVL 5/30 by Dr Excell Seltzer  - findings of LVA dissecting 3mm fusiform pseudoaneurysm and LM3 dissecting 2mm pseudoaneurysm  - s/p EVD placement  - s/p LVA pseudoaneurysm endovascular test occlusion, LVA pseudoaneurysm endovascular sacrifice deconstruction, cerebral angiography on 5/31 by Dr Excell Seltzer  -NEW sp 6/6 intra-arterial vasodilator, cerebral angiography - Mx per Dr Excell Seltzer and Dr Su Ley??   Resp failure-resolved   s/p intubation 5/30, extubated 6/3 - per others??   H/o DVT on coumadin- currently on hold  - PVL's neg s/o acute thrombosis??   - Mx per others??   Hypothyroid?? ??     MICROBIOLOGY:   CSF 5/31 ng, 6/2-6 ngtd  5/30  ucx ng   rpr nr  5/31  Blcx x2 ng           Ucx neg           resp cx rare Enterobacter, CF and C albicans  6/3 bctx 1 of 2 bacillus sp    uctx ngtd  6/4 bctx x 2 ngtd    LINES AND CATHETERS:   EVD 5/30  PICC 5/30    SUBJECTIVE :     Interval notes reviewed. Just back from angio and intraarterial therapy for vasospasm.  She says feels better, off cooling blanket and d/w her likely streamline or stop abx depending on routine ventric management of Dr. Excell Seltzer.  She's asking for a coke and her dinner, denied h/a sob has rare cough no abd pain and denied rash.       Current Facility-Administered Medications   Medication Dose Route Frequency   ??? meropenem (MERREM) 1 gram injection       ??? calcium gluconate 100 mg/mL (10%) injection       ??? [START ON 12/06/2014] VANCOMYCIN INFORMATION NOTE   Other ONCE   ??? dextrose 5% - 0.9% NaCl with KCl 40 mEq/L infusion   IntraVENous CONTINUOUS   ??? albuterol-ipratropium (  DUO-NEB) 2.5 MG-0.5 MG/3 ML  3 mL Nebulization Q8H RT   ??? vancomycin (VANCOCIN) 1,500 mg in 0.9% sodium chloride 150 mL IVPB  1,500 mg IntraVENous Q12H   ??? ibuprofen (MOTRIN) tablet 600 mg  600 mg Oral Q6H   ??? Thiamine Mononitrate (B-1) tablet 100 mg  100 mg Oral DAILY   ??? iron sucrose (VENOFER) 200 mg in 0.9% sodium chloride 100 mL IVPB  200 mg IntraVENous Q24H   ??? therapeutic multivitamin (THERAGRAN) tablet 1 Tab  1 Tab Oral DAILY   ??? folic acid (FOLVITE) tablet 1 mg  1 mg Oral DAILY   ??? famotidine (PEPCID) tablet 20 mg  20 mg Oral Q12H   ??? famotidine (PF) (PEPCID) 20 mg in sodium chloride 0.9 % 10 mL injection  20 mg IntraVENous Q12H   ??? acetaminophen (TYLENOL) tablet 650 mg  650 mg Oral Q4H PRN   ??? meropenem (MERREM) 1 g in 0.9% sodium chloride (MBP/ADV) 50 mL MBP  1 g IntraVENous Q8H    ??? niMODipine (NIMOTOP) capsule 30 mg  30 mg Oral Q2H   ??? aspirin chewable tablet 81 mg  81 mg Oral DAILY    Or   ??? aspirin (ASA) suppository 300 mg  300 mg Rectal DAILY   ??? ELECTROLYTE REPLACEMENT PROTOCOL  1 Each Other Q8H   ??? ELECTROLYTE REPLACEMENT PROTOCOL  1 Each Other Q8H   ??? ELECTROLYTE REPLACEMENT PROTOCOL  1 Each Other Q8H   ??? PHARMACY INFORMATION NOTE  1 Each Other DAILY   ??? ondansetron (ZOFRAN) injection 1 mg  1 mg IntraVENous Q6H PRN   ??? acetaminophen (TYLENOL) suppository 650 mg  650 mg Rectal Q4H PRN   ??? senna-docusate (PERICOLACE) 8.6-50 mg per tablet 2 Tab  2 Tab Oral QHS   ??? bisacodyl (DULCOLAX) tablet 5 mg  5 mg Oral DAILY PRN   ??? bisacodyl (DULCOLAX) suppository 10 mg  10 mg Rectal DAILY PRN   ??? magnesium sulfate 2 g/50 ml IVPB (premix or compounded)  2 g IntraVENous Q12H   ??? albuterol (PROVENTIL VENTOLIN) nebulizer solution 2.5 mg  2.5 mg Nebulization Q4H PRN   ??? 0.9% sodium chloride infusion  5 mL/hr IntraVENous CONTINUOUS   ??? sodium chloride (NS) flush 10 mL  10 mL InterCATHeter PRN   ??? sodium chloride (NS) flush 10-40 mL  10-40 mL InterCATHeter Q8H   ??? sodium chloride (NS) flush 10-30 mL  10-30 mL InterCATHeter PRN   ??? bacitracin 500 unit/gram packet 1 Packet  1 Packet Topical PRN          OBJECTIVE     BP 153/99 mmHg   Pulse 114   Temp(Src) 99.7 ??F (37.6 ??C)   Resp 27   Ht  (1.676 m)   Wt 100.9 kg (222 lb 7.1 oz)   BMI 35.92 kg/m2   SpO2 97%    Temp (24hrs), Avg:99.7 ??F (37.6 ??C), Min:98.3 ??F (36.8 ??C), Max:100.2 ??F (37.9 ??C)    GEN - pleasant BF awake and conversing in nicu off cooling blanket currently, less shaky  HEENT: R ventric site dry serosang fluid in drain, no thrush  RESP- no crackles or wheezes  CVS- s1s2 normal, no murmur   ABD- soft, NT, BS present  EXT- no edema   SKIN - no rash   NEURO - awake, answers appropriately, sl euphoric and ~sl confused.      Labs: Results:   Chemistry Recent Labs      12/05/14   0330  12/04/14   0345  12/03/14   2200   12/03/14   0330    GLU  113*  106*   --    --   112*   NA  143  146*   --    --   143   K  3.8  4.2  3.6   < >  3.7   CL  110*  112*   --    --   108   CO2  24  24   --    --   25   BUN  14  12   --    --   12   CREA  0.64  0.58*   --    --   0.67   CA  8.2*  8.1*   --    --   8.1*   AGAP  9  10   --    --   10   BUCR  22*  21*   --    --   18   AP  104  105   --    --   104   TP  7.1  7.3   --    --   7.4   ALB  2.6*  2.7*   --    --   2.6*   GLOB  4.5*  4.6*   --    --   4.8*   AGRAT  0.6*  0.6*   --    --   0.5*    < > = values in this interval not displayed.      CBC w/Diff Recent Labs      12/05/14   0330  12/04/14   0345  12/03/14   0330   WBC  12.8  14.0*  15.4*   RBC  3.87*  3.93*  3.86*   HGB  11.2*  11.2*  11.0*   HCT  33.5*  33.9*  33.1*   PLT  194  157  137   GRANS   --    --   79*   LYMPH   --    --   10*   EOS   --    --   1        cxr 6/4 IMPRESSION:  ??  ??  1. No focal infiltrates.  ??  2. Borderline heart size without CHF.  ??  ??  3. Stable large hiatal hernia.      6/6 CTH IMPRESSION:  ??  1. Decreasing subarachnoid hemorrhage and near interval resolution of  intraventricular hemorrhage with stable ventricular size and configuration. ??  ??  2. Probable intraparenchymal hemorrhage in the superior aspect of the right  frontal lobe without significant interval change.  ??  3. 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  ??  4. No new intracranial abnormalities are identified.    [place] 6/3 IMPRESSION:  ??  ??  1. Increased echogenicity of the liver which can be seen with hepatic steatosis  or hepatocellular disease. Exam is otherwise unremarkable.    6/4 CT IMPRESSION:  PANCREAS: Grossly normal  ??  1.?? Streak artifact degrades images. No definite evidence of pancreatitis.  2. Large hiatal hernia with bilateral lower lobe areas of scarring versus  atelectasis.  3. Probable gallbladder sludge without obvious stones. No evidence of acute  cholecystitis.  4. Nonobstructing left nephrolithiasis.  5. Fibroid uterus.   6.  No evidence of bowel obstruction. Normal appendix.  7. Induration of the subcutaneous fat bilateral inguinal regions. This could  represent scarring however hematomas from recent vascular procedures could cause  similar appearance. No large hematoma.      Berenice BoutonJ. CONRAD Daytona Retana, MD  December 05, 2014  [facility]  [phone]

## 2014-12-05 NOTE — Progress Notes (Signed)
Speech Therapy Note:    Attempted follow-up dysphagia tx this day; however, pt NPO and off floor for procedure. Will follow-up later this day as time permits.     Jeshawn Melucci A. Yates DecampCardoso, M.S., CCC-SLP  Office: [phone]  Pager: [phone]

## 2014-12-05 NOTE — Procedures (Signed)
[facility]  *** FINAL REPORT ***    Name: Renee West, Renee West  MRN: [id number]    Inpatient  DOB: 10 Nov 1955  HIS Order #: [id number]  TRAKnet Visit #: [id number]  Date: 04 Dec 2014    TYPE OF TEST: Peripheral Venous Testing    REASON FOR TEST  Elevated D-Dimer    Right Leg:-  Deep venous thrombosis:           No  Superficial venous thrombosis:    No  Deep venous insufficiency:        No  Superficial venous insufficiency: No    Left Leg:-  Deep venous thrombosis:           No  Superficial venous thrombosis:    No  Deep venous insufficiency:        No  Superficial venous insufficiency: No      INTERPRETATION/FINDINGS  Duplex images were obtained using 2-D gray scale, color flow, and  spectral Doppler analysis.  Right leg :  1. Deep vein(s) visualized include the common femoral, deep femoral,  proximal femoral, mid femoral, distal femoral, popliteal(above knee),  popliteal(fossa), popliteal(below knee) and posterior tibial veins.  2. No evidence of deep venous thrombosis detected in the veins  visualized.  3. No evidence of superficial thrombosis detected.  4. No evidence of reflux detected in the deep veins visualized.  5. No evidence of reflux detected in the superficial veins visualized.  Left leg :  1. Deep vein(s) visualized include the common femoral, deep femoral,  proximal femoral, mid femoral, distal femoral, popliteal(above knee),  popliteal(fossa), popliteal(below knee) and posterior tibial veins.  2. No evidence of deep venous thrombosis detected in the veins  visualized.  3. No evidence of superficial thrombosis detected.  4. No evidence of reflux detected in the deep veins visualized.  5. No evidence of reflux detected in the superficial veins visualized.    ADDITIONAL COMMENTS    I have personally reviewed the data relevant to the interpretation of  this  study.    TECHNOLOGIST: Francee Piccoloonna Blanchard, RVS  Signed: 12/04/2014 08:39 AM    PHYSICIAN: Modena SlaterFrederick N. Isaic Syler, MD   Signed: 12/05/2014 01:15 AM

## 2014-12-06 ENCOUNTER — Inpatient Hospital Stay
Admit: 2014-12-06 | Payer: BLUE CROSS/BLUE SHIELD | Primary: Student in an Organized Health Care Education/Training Program

## 2014-12-06 LAB — LIPASE: Lipase: 1616 U/L — ABNORMAL HIGH (ref 73–393)

## 2014-12-06 LAB — CBC W/O DIFF
HCT: 33.3 % — ABNORMAL LOW (ref 35.0–45.0)
HGB: 11 g/dL — ABNORMAL LOW (ref 12.0–16.0)
MCH: 28.6 PG (ref 24.0–34.0)
MCHC: 33 g/dL (ref 31.0–37.0)
MCV: 86.7 FL (ref 74.0–97.0)
MPV: 10.8 FL (ref 9.2–11.8)
PLATELET: 239 10*3/uL (ref 135–420)
RBC: 3.84 M/uL — ABNORMAL LOW (ref 4.20–5.30)
RDW: 14.7 % — ABNORMAL HIGH (ref 11.6–14.5)
WBC: 18.1 10*3/uL — ABNORMAL HIGH (ref 4.6–13.2)

## 2014-12-06 LAB — METABOLIC PANEL, BASIC
Anion gap: 12 mmol/L (ref 3.0–18)
BUN/Creatinine ratio: 17 (ref 12–20)
BUN: 10 MG/DL (ref 7.0–18)
CO2: 21 mmol/L (ref 21–32)
Calcium: 8.5 MG/DL (ref 8.5–10.1)
Chloride: 105 mmol/L (ref 100–108)
Creatinine: 0.6 MG/DL (ref 0.6–1.3)
GFR est AA: >60 mL/min/{1.73_m2} (ref >60)
GFR est non-AA: >60 mL/min/{1.73_m2} (ref >60)
Glucose: 130 mg/dL — ABNORMAL HIGH (ref 74–99)
Potassium: 3.7 mmol/L (ref 3.5–5.5)
Sodium: 138 mmol/L (ref 136–145)

## 2014-12-06 LAB — HEPATIC FUNCTION PANEL
A-G Ratio: 0.6 — ABNORMAL LOW (ref 0.8–1.7)
ALT (SGPT): 39 U/L (ref 13–56)
AST (SGOT): 43 U/L — ABNORMAL HIGH (ref 15–37)
Albumin: 2.8 g/dL — ABNORMAL LOW (ref 3.4–5.0)
Alk. phosphatase: 120 U/L — ABNORMAL HIGH (ref 45–117)
Bilirubin, direct: 0.4 MG/DL — ABNORMAL HIGH (ref 0.0–0.2)
Bilirubin, total: 0.8 MG/DL (ref 0.2–1.0)
Globulin: 4.9 g/dL — ABNORMAL HIGH (ref 2.0–4.0)
Protein, total: 7.7 g/dL (ref 6.4–8.2)

## 2014-12-06 LAB — AMYLASE: Amylase: 164 U/L — ABNORMAL HIGH (ref 25–115)

## 2014-12-06 LAB — CARDIAC PANEL,(CK, CKMB & TROPONIN)
CK - MB: 2.4 ng/ml (ref 0.5–3.6)
CK-MB Index: 1.7 % (ref 0.0–4.0)
CK: 138 U/L (ref 26–192)
Troponin-I, QT: <0.02 NG/ML (ref 0.0–0.045)

## 2014-12-06 LAB — CALCIUM, IONIZED: Ionized Calcium: 1.14 MMOL/L (ref 1.12–1.32)

## 2014-12-06 LAB — OCCULT BLOOD, STOOL: Occult blood, stool: POSITIVE — AB

## 2014-12-06 LAB — PTT: aPTT: 31.8 s (ref 24.6–37.7)

## 2014-12-06 LAB — SODIUM
Sodium: 140 mmol/L (ref 136–145)
Sodium: 141 mmol/L (ref 136–145)
Sodium: 140 mmol/L (ref 136–145)

## 2014-12-06 LAB — PROTHROMBIN TIME + INR
INR: 1.1 (ref 0.8–1.2)
Prothrombin time: 13.7 s (ref 11.5–15.2)

## 2014-12-06 LAB — PHOSPHORUS
Phosphorus: 1.8 MG/DL — ABNORMAL LOW (ref 2.5–4.9)
Phosphorus: 2.4 MG/DL — ABNORMAL LOW (ref 2.5–4.9)

## 2014-12-06 LAB — CULTURE, CSF W GRAM STAIN
Culture result:: NO GROWTH
GRAM STAIN: NONE SEEN

## 2014-12-06 LAB — POTASSIUM
Potassium: 3.7 mmol/L (ref 3.5–5.5)
Potassium: 3.9 mmol/L (ref 3.5–5.5)

## 2014-12-06 LAB — AMMONIA: Ammonia, plasma: 39 umol/L — ABNORMAL HIGH (ref 11–32)

## 2014-12-06 LAB — MAGNESIUM: Magnesium: 2.1 mg/dL (ref 1.8–2.4)

## 2014-12-06 MED ORDER — SODIUM CHLORIDE 0.9 % IV
INTRAVENOUS | Status: DC | PRN
Start: 2014-12-06 — End: 2014-12-08

## 2014-12-06 MED ORDER — HYPERTONIC SALINE 3 % (NEUROSCIENCE USE ONLY) INFUSION
3 % | INTRAVENOUS | Status: DC
Start: 2014-12-06 — End: 2014-12-08
  Administered 2014-12-06 – 2014-12-07 (×4): via INTRAVENOUS

## 2014-12-06 MED ORDER — PANTOPRAZOLE 40 MG TAB, DELAYED RELEASE
40 mg | Freq: Two times a day (BID) | ORAL | Status: DC
Start: 2014-12-06 — End: 2014-12-12
  Administered 2014-12-06 – 2014-12-12 (×13): via ORAL

## 2014-12-06 MED ORDER — SODIUM CHLORIDE 0.9 % IV
3 mmol/mL | Freq: Once | INTRAVENOUS | Status: AC
Start: 2014-12-06 — End: 2014-12-06
  Administered 2014-12-06: 03:00:00 via INTRAVENOUS

## 2014-12-06 MED ORDER — SODIUM CHLORIDE 0.65 % NASAL SPRAY AEROSOL
0.65 % | Freq: Two times a day (BID) | NASAL | Status: DC
Start: 2014-12-06 — End: 2014-12-12
  Administered 2014-12-06 – 2014-12-12 (×11): via NASAL

## 2014-12-06 MED ORDER — POTASSIUM CHLORIDE SR 10 MEQ TAB, PARTICLES/CRYSTALS
10 mEq | Freq: Once | ORAL | Status: AC
Start: 2014-12-06 — End: 2014-12-06
  Administered 2014-12-06: 19:00:00 via ORAL

## 2014-12-06 MED ORDER — POTASSIUM CHLORIDE SR 20 MEQ TAB, PARTICLES/CRYSTALS
20 mEq | Freq: Once | ORAL | Status: AC
Start: 2014-12-06 — End: 2014-12-06
  Administered 2014-12-06: 12:00:00 via ORAL

## 2014-12-06 MED ORDER — HYPERTONIC SALINE 3 % (NEUROSCIENCE USE ONLY) INFUSION
3 % | INTRAVENOUS | Status: DC
Start: 2014-12-06 — End: 2014-12-06
  Administered 2014-12-06: 13:00:00 via INTRAVENOUS

## 2014-12-06 MED ORDER — POTASSIUM CHLORIDE SR 20 MEQ TAB, PARTICLES/CRYSTALS
20 mEq | Freq: Once | ORAL | Status: AC
Start: 2014-12-06 — End: 2014-12-05
  Administered 2014-12-06: 03:00:00 via ORAL

## 2014-12-06 MED ORDER — LIDOCAINE HCL 2 % (20 MG/ML) IJ SOLN
20 mg/mL (2 %) | Freq: Once | INTRAMUSCULAR | Status: AC
Start: 2014-12-06 — End: 2014-12-07
  Administered 2014-12-06: 18:00:00 via INTRADERMAL

## 2014-12-06 MED ORDER — FENTANYL CITRATE (PF) 50 MCG/ML IJ SOLN
50 mcg/mL | Freq: Once | INTRAMUSCULAR | Status: AC
Start: 2014-12-06 — End: 2014-12-06
  Administered 2014-12-06: 18:00:00 via INTRAVENOUS

## 2014-12-06 MED ORDER — LIDOCAINE HCL 1 % (10 MG/ML) IJ SOLN
10 mg/mL (1 %) | INTRAMUSCULAR | Status: AC
Start: 2014-12-06 — End: 2014-12-07
  Administered 2014-12-06: 18:00:00

## 2014-12-06 MED ORDER — LIDOCAINE (PF) 20 MG/ML (2 %) IJ SOLN
20 mg/mL (2 %) | INTRAMUSCULAR | Status: DC
Start: 2014-12-06 — End: 2014-12-06
  Administered 2014-12-06: 18:00:00

## 2014-12-06 MED ORDER — SODIUM CHLORIDE 0.9 % INJECTION
40 mg | Freq: Two times a day (BID) | INTRAMUSCULAR | Status: DC
Start: 2014-12-06 — End: 2014-12-09

## 2014-12-06 MED FILL — IBUPROFEN 600 MG TAB: 600 mg | ORAL | Qty: 1

## 2014-12-06 MED FILL — NIMODIPINE 30 MG CAP: 30 mg | ORAL | Qty: 1

## 2014-12-06 MED FILL — THIAMINE MONONITRATE 100 MG TABLET: 100 mg | ORAL | Qty: 1

## 2014-12-06 MED FILL — SODIUM PHOSPHATE 3 MMOLE/ML IV: 3 mmol/mL | INTRAVENOUS | Qty: 1

## 2014-12-06 MED FILL — FAMOTIDINE 20 MG TAB: 20 mg | ORAL | Qty: 1

## 2014-12-06 MED FILL — SODIUM CHLORIDE 3 % HYPERTONIC INTRAVENOUS INJECTION SOLUTION: 3 % | INTRAVENOUS | Qty: 500

## 2014-12-06 MED FILL — IPRATROPIUM-ALBUTEROL 2.5 MG-0.5 MG/3 ML NEB SOLUTION: 2.5 mg-0.5 mg/3 ml | RESPIRATORY_TRACT | Qty: 3

## 2014-12-06 MED FILL — PANTOPRAZOLE 40 MG TAB, DELAYED RELEASE: 40 mg | ORAL | Qty: 1

## 2014-12-06 MED FILL — ASPIRIN 81 MG CHEWABLE TAB: 81 mg | ORAL | Qty: 1

## 2014-12-06 MED FILL — SODIUM CHLORIDE 0.9 % IV: INTRAVENOUS | Qty: 250

## 2014-12-06 MED FILL — KLOR-CON M20 MEQ TABLET,EXTENDED RELEASE: 20 mEq | ORAL | Qty: 1

## 2014-12-06 MED FILL — LIDOCAINE (PF) 20 MG/ML (2 %) IJ SOLN: 20 mg/mL (2 %) | INTRAMUSCULAR | Qty: 5

## 2014-12-06 MED FILL — FENTANYL CITRATE (PF) 50 MCG/ML IJ SOLN: 50 mcg/mL | INTRAMUSCULAR | Qty: 2

## 2014-12-06 MED FILL — FOLIC ACID 1 MG TAB: 1 mg | ORAL | Qty: 1

## 2014-12-06 MED FILL — MAGNESIUM SULFATE 2 GRAM/50 ML IVPB: 2 gram/50 mL (4 %) | INTRAVENOUS | Qty: 50

## 2014-12-06 MED FILL — THERA 400 MCG TABLET: 400 mcg | ORAL | Qty: 1

## 2014-12-06 MED FILL — XYLOCAINE 20 MG/ML (2 %) INJECTION SOLUTION: 20 mg/mL (2 %) | INTRAMUSCULAR | Qty: 0.1

## 2014-12-06 MED FILL — DEEP SEA NASAL 0.65 % SPRAY AEROSOL: 0.65 % | NASAL | Qty: 44

## 2014-12-06 MED FILL — LIDOCAINE HCL 1 % (10 MG/ML) IJ SOLN: 10 mg/mL (1 %) | INTRAMUSCULAR | Qty: 20

## 2014-12-06 MED FILL — SODIUM CHLORIDE 0.9 % IV PIGGY BACK: INTRAVENOUS | Qty: 50

## 2014-12-06 MED FILL — SENNA PLUS 8.6 MG-50 MG TABLET: ORAL | Qty: 2

## 2014-12-06 MED FILL — POTASSIUM CHLORIDE SR 10 MEQ TAB, PARTICLES/CRYSTALS: 10 mEq | ORAL | Qty: 1

## 2014-12-06 MED FILL — D5-NS WITH POTASSIUM CHLORIDE 40 MEQ/L IV: 40 mEq/L | INTRAVENOUS | Qty: 1000

## 2014-12-06 NOTE — Other (Signed)
NUTRITION       Pt currently NPO for procedure.    Will follow up 12/07/14 to check feeding status.      Shyrl NumbersMARILOU  COLGAN, RD   LR pager [phone]

## 2014-12-06 NOTE — Progress Notes (Signed)
PT attempted x 2 for re-evaluation. At first time of attempt, pt off floor for testing. At second attempt, pt having EVD removed. Pt to follow up with re-assessment later today pending time or first thing in the AM.    Please clarify orders and d/c bedrest when appropriate.    Thank you.  Philemon KingdomJennifer Strack PT, DPT  [phone]

## 2014-12-06 NOTE — Progress Notes (Addendum)
Speech Therapy Note:    Attempted to see pt this am for dysphagia tx, however after d/w RN, Renee West, pt is NPO for scheduled procedure this afternoon. Will follow up next day as indicated.      Thank you,   Darletta MollAlexandra West, SLP Intern for  Renee West,SLP       2nd attempt made to see pt; however, pt continues NPO for possible procedure. SLP will follow-up as indicated next day. D/w RN, Renee West.  Renee West, M.S., CCC-SLP  Office: [phone]  Pager: [phone]

## 2014-12-06 NOTE — Progress Notes (Signed)
2000 - Assumed care of patient. Assessment in progress. NIH of 1. Pupils equal reactive.   2100 - Bedside and Verbal shift change report given to Rayburn GoJess Bethea (oncoming nurse) by Maury DusL Kenny (offgoing nurse). Report included the following information SBAR and Kardex.

## 2014-12-06 NOTE — Progress Notes (Signed)
Pt as assessed, According to RN on night shift she was quite restless, She is more calm and restful now. Pt NPO for possible procedure today.  '  At 12noon Dr. Excell SeltzerBaker and his team here, Dr. Excell SeltzerBaker changed the  ventric and re-zero'd it, no drainage at this point.    Crystal  Baker in and discontinued Ventriculostomy.  Site clean with opsite  on it. At 1400    At 1600 reassessed, no changes in Neuro status pt oriented and cooperative.    1800 pt again reasessed hourly, no changes, pt very happy to be able to lie on her side and is sleeping when undisturbed, but wakes easily to voice.

## 2014-12-06 NOTE — Progress Notes (Addendum)
Patient was off unit yesterday and was unable to discuss discharge plan.   She is extubated and much moe alert as compared to her initial presentation. Her family has requested she attend rehab in the [place] area.  The [place] when [place] became ill. I have explained to the family that the patients insurance may not cover transportation to an [place] rehab facility. If she is accepted to an [place] rehab facility she will require PASR.  [place]. Lurena NidaAngela L Baker, RN

## 2014-12-06 NOTE — Progress Notes (Signed)
Two attempts made to complete OT re-evaluation. At time of first attempt, patient off floor for testing. At time of second attempt, pt have EVD removed. OT to follow up with patient tomorrow.    Lora PaulaHeather Wolf, MS OTR/L

## 2014-12-06 NOTE — Progress Notes (Signed)
INFECTIOUS DISEASE FOLLOW UP NOTE :-     Admit Date: 11/28/2014     Current  Prior    Off abx 6/6-1 Vanco/Meropenem 5/30-7     ASSESSMENT: -> RECS     Fever - since admission 5/30  - ?etiology: ?[facility], w/ elevated lipase 2008 ?pancreatitis, less likely drug fever  --Lines and EVD new, 1 of mult bctx's bacillus sp, likely contam, serial csf cultures neg so far  -some UA pyuria but uctx 5/30  -CXR 6/4 NAD, rare enterobacter in sputum   -ua 0-3 wbc 0.5% eos, significance unclear without rash or aki   -lipase remains 2000, GI seeing  -6/1 CTH sphenoid sinus AFL likely d/t intubation now extubated and on bsabx  -pvl neg dvt LE 5/31 and 6/5, NEW ANA positive -no new pos cultures, T better (on cooling blanket) and remain suspicious of central fever, cannot exclude drug fever - abx stopped at 7 days   ->monitor off abx         biochem pancreatitis pancreas grossly nl on CT   -lipase little-changed today ->appreciate GI input   Mild leukocytosis??  - 15->14k->13->18k today  -> monitor??   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 EVD out today??   Resp failure-resolved  s/p intubation 5/30, extubated 6/3 - per others??   H/o DVT on coumadin- currently on hold   - PVL's neg for acute thrombosis??   - Mx per others??   H/o hypothryoidism, history of thyroid surgery  -exophthalmos, free T4 nl, TSH low- ?on thryoid replacement PTA, ?? Euthyroid Graves  -NEW ANA positive, ? Underlying autoimmune disorder ->check for TSH-receptor Ab   ->await ANA titer/reflex     MICROBIOLOGY:   CSF 5/31, 6/2 ng, 6/3-6 ngtd  5/30  ucx ng   rpr nr  5/31  Blcx x2 ng           Ucx neg           resp cx rare Enterobacter, CF and C albicans  6/3 bctx 1 of 2 bacillus sp    uctx ngtd  6/4 bctx x 2 ngtd    LINES AND CATHETERS:   EVD 5/30  PICC 6/6    SUBJECTIVE :     Interval notes reviewed. Pt remains pleasantly confused, says going for CT  (done this am), and asking if I would be the one going in her groin again.  Re-explained role and d/w nurse understand concern for vasospasm poss repeat angio today but EVD may be removed this afternoon.  D/w patient no new pos cultures and will monitor now off bsabx.  .       Current Facility-Administered Medications   Medication Dose Route Frequency   ??? sodium chloride (OCEAN) 0.65 % nasal spray 2 Spray  2 Spray Both Nostrils Q12H   ??? pantoprazole (PROTONIX) tablet 40 mg  40 mg Oral Q12H  Or   ??? pantoprazole (PROTONIX) 40 mg in sodium chloride 0.9 % 10 mL injection  40 mg IntraVENous Q12H   ??? hypertonic saline 3 % (NEURO SCIENCE USE ONLY) infusion  0-1 mL/kg/hr IntraVENous TITRATE   ??? 0.9% sodium chloride infusion 250 mL  250 mL IntraVENous PRN   ??? potassium chloride (K-DUR, KLOR-CON) tablet 10 mEq  10 mEq Oral ONCE   ??? lidocaine (XYLOCAINE) 20 mg/mL (2 %) injection 2 mg  0.1 mL IntraDERMal ONCE   ??? fentaNYL citrate (PF) injection 25 mcg  25 mcg IntraVENous ONCE   ??? dextrose 5% - 0.9% NaCl with KCl 40 mEq/L infusion   IntraVENous CONTINUOUS   ??? albuterol-ipratropium (DUO-NEB) 2.5 MG-0.5 MG/3 ML  3 mL Nebulization Q8H RT   ??? Thiamine Mononitrate (B-1) tablet 100 mg  100 mg Oral DAILY   ??? therapeutic multivitamin (THERAGRAN) tablet 1 Tab  1 Tab Oral DAILY    ??? folic acid (FOLVITE) tablet 1 mg  1 mg Oral DAILY   ??? acetaminophen (TYLENOL) tablet 650 mg  650 mg Oral Q4H PRN   ??? niMODipine (NIMOTOP) capsule 30 mg  30 mg Oral Q2H   ??? aspirin chewable tablet 81 mg  81 mg Oral DAILY    Or   ??? aspirin (ASA) suppository 300 mg  300 mg Rectal DAILY   ??? ELECTROLYTE REPLACEMENT PROTOCOL  1 Each Other Q8H   ??? ELECTROLYTE REPLACEMENT PROTOCOL  1 Each Other Q8H   ??? ELECTROLYTE REPLACEMENT PROTOCOL  1 Each Other Q8H   ??? PHARMACY INFORMATION NOTE  1 Each Other DAILY   ??? ondansetron (ZOFRAN) injection 1 mg  1 mg IntraVENous Q6H PRN   ??? acetaminophen (TYLENOL) suppository 650 mg  650 mg Rectal Q4H PRN   ??? senna-docusate (PERICOLACE) 8.6-50 mg per tablet 2 Tab  2 Tab Oral QHS   ??? bisacodyl (DULCOLAX) tablet 5 mg  5 mg Oral DAILY PRN   ??? bisacodyl (DULCOLAX) suppository 10 mg  10 mg Rectal DAILY PRN   ??? magnesium sulfate 2 g/50 ml IVPB (premix or compounded)  2 g IntraVENous Q12H   ??? albuterol (PROVENTIL VENTOLIN) nebulizer solution 2.5 mg  2.5 mg Nebulization Q4H PRN   ??? 0.9% sodium chloride infusion  5 mL/hr IntraVENous CONTINUOUS   ??? sodium chloride (NS) flush 10 mL  10 mL InterCATHeter PRN   ??? sodium chloride (NS) flush 10-40 mL  10-40 mL InterCATHeter Q8H   ??? sodium chloride (NS) flush 10-30 mL  10-30 mL InterCATHeter PRN   ??? bacitracin 500 unit/gram packet 1 Packet  1 Packet Topical PRN          OBJECTIVE     BP 180/112 mmHg   Pulse 88   Temp(Src) 99.4 ??F (37.4 ??C)   Resp 19   Ht 5\' 6"  (1.676 m)   Wt 100.9 kg (222 lb 7.1 oz)   BMI 35.92 kg/m2   SpO2 96%    Temp (24hrs), Avg:99 ??F (37.2 ??C), Min:98.4 ??F (36.9 ??C), Max:100.4 ??F (38 ??C)    GEN - pleasant BF awake and mildly confused in NAD in nicu on cooling blanket, less shaky  HEENT: R ventric site dry serosang fluid in drain, no thrush.  OU exophthalmos  RESP- no crackles or wheezes  CVS- s1s2 normal, no murmur   ABD- soft, NT, BS present  EXT- no edema   SKIN - no rash   NEURO - awake, answers mostly appropriate, sl confused.  Labs: Results:   Chemistry Recent Labs      12/06/14   1135  12/06/14   0925  12/06/14   0405  12/05/14   1815  12/05/14   0330  12/04/14   0345   GLU   --    --   130*   --   113*  106*   NA   --   140  138   --   143  146*   K  3.9   --   3.7  3.7  3.8  4.2   CL   --    --   105   --   110*  112*   CO2   --    --   21   --   24  24   BUN   --    --   10   --   14  12   CREA   --    --   0.60   --   0.64  0.58*   CA   --    --   8.5   --   8.2*  8.1*   AGAP   --    --   12   --   9  10   BUCR   --    --   17   --   22*  21*   AP   --    --   120*   --   104  105   TP   --    --   7.7   --   7.1  7.3   ALB   --    --   2.8*   --   2.6*  2.7*   GLOB   --    --   4.9*   --   4.5*  4.6*   AGRAT   --    --   0.6*   --   0.6*  0.6*      CBC w/Diff Recent Labs      12/06/14   0405  12/05/14   0330  12/04/14   0345   WBC  18.1*  12.8  14.0*   RBC  3.84*  3.87*  3.93*   HGB  11.0*  11.2*  11.2*   HCT  33.3*  33.5*  33.9*   PLT  239  194  157        cxr 6/6 IMPRESSION:  1. Right peripherally inserted central venous catheter present at the level of  the distal superior vena cava.  2. Moderate pulmonary hypoinflation with associated bronchovascular crowding.      6/7 CTH Stable appearance of the right frontal parenchymal hemorrhage and areas of  subarachnoid hemorrhage.  ??  The midportion of the lateral ventricles measure 9 mm. This area measured  between 8 and 9 mm on the prior study. The temporal horns are minimally more  prominent on the current study. The third ventricle measures 3 mm, prior 2 mm. ??  Mild increase in size of the ventricles (about 1 mm) is suggested in comparison  to the prior study.  ??  A small amount of intraventricular blood is present layering in the posterior  horn of the left lateral ventricle.    [place] 6/3 IMPRESSION:  ??  ??  1. Increased echogenicity of the liver which can be seen with hepatic steatosis  or hepatocellular disease. Exam  is otherwise unremarkable.     6/4 CT IMPRESSION:  PANCREAS: Grossly normal  ??  1.?? Streak artifact degrades images. No definite evidence of pancreatitis.  2. Large hiatal hernia with bilateral lower lobe areas of scarring versus  atelectasis.  3. Probable gallbladder sludge without obvious stones. No evidence of acute  cholecystitis.  4. Nonobstructing left nephrolithiasis.  5. Fibroid uterus.  6. No evidence of bowel obstruction. Normal appendix.  7. Induration of the subcutaneous fat bilateral inguinal regions. This could  represent scarring however hematomas from recent vascular procedures could cause  similar appearance. No large hematoma.      Berenice Bouton, MD  December 06, 2014  [facility]  [phone]

## 2014-12-06 NOTE — Progress Notes (Signed)
Chaplain completed follow up visit with patient  And a Spiritual assessment of patient was done  and Pastoral care was restated.  Patient says she feels some better.  Chaplains will continue to follow and will provide pastoral care on an as needed/requested basis    Chaplain Environmental managerrnie Etheridge   Board Certified Chaplain   Spiritual Care   [phone]

## 2014-12-06 NOTE — Progress Notes (Signed)
Neurovascular Progress Note      Patient: Renee West MRN: [id number]  SSN: [id number]    Date of Birth: 1955-07-08  Age: 60 y.o.  Sex: female        Events  Patient more fidgety overnight. SBP autoregulated to 180-220 mmHg. EVD did not drain. Patient restarted on hypertonic saline.     Patient underwent CT of head this morning which revealed stable appearance of the right frontal hemorrhage and areas of  subarachnoid hemorrhage.?? The ventricular system is stable in size and midline. The subacute infarction in the right cerebral hemisphere is unchanged.    Attempted to manipulate drain to promote drainage and it was unsuccessful. EVD removed.??    Stool heme positive.     Vital Signs  Patient Vitals for the past 24 hrs:   Temp Pulse Resp BP SpO2   12/06/14 1314 - 88 - (!) 180/112 mmHg -   12/06/14 1215 - (!) 105 19 - -   12/06/14 1200 99.4 ??F (37.4 ??C) (!) 101 28 175/83 mmHg 96 %   12/06/14 1145 - 96 21 - (!) 80 %   12/06/14 1130 - 100 27 - (!) 76 %   12/06/14 1115 - (!) 101 30 - 96 %   12/06/14 1100 99 ??F (37.2 ??C) 98 23 173/90 mmHg 96 %   12/06/14 1054 - 89 - 180/79 mmHg -   12/06/14 1045 - (!) 102 23 - -   12/06/14 1030 - (!) 103 (!) 32 - 96 %   12/06/14 1000 98.8 ??F (37.1 ??C) - - - -   12/06/14 0945 - (!) 108 26 - 98 %   12/06/14 0930 - (!) 104 27 - 98 %   12/06/14 0915 - (!) 105 26 - 98 %   12/06/14 0911 - (!) 110 - (!) 185/109 mmHg -   12/06/14 0900 98.8 ??F (37.1 ??C) (!) 114 26 (!) 185/109 mmHg 96 %   12/06/14 0845 - (!) 106 30 - 97 %   12/06/14 0830 - (!) 110 29 - 95 %   12/06/14 0815 - (!) 109 25 - 96 %   12/06/14 0800 98.4 ??F (36.9 ??C) (!) 101 (!) 35 (!) 186/92 mmHg 99 %   12/06/14 0745 - 99 30 - 98 %   12/06/14 0736 - 85 - (!) 186/96 mmHg 98 %   12/06/14 0730 - (!) 112 21 - 90 %   12/06/14 0715 - 100 17 - 98 %   12/06/14 0700 - 96 19 (!) 183/96 mmHg -   12/06/14 0631 - - - (!) 168/93 mmHg -   12/06/14 0602 - - - (!) 218/106 mmHg -   12/06/14 0600 - (!) 101 26 (!) 224/110 mmHg 95 %    12/06/14 0500 - 96 29 (!) 185/94 mmHg -   12/06/14 0423 - - - (!) 181/92 mmHg -   12/06/14 0400 99.3 ??F (37.4 ??C) (!) 135 30 (!) 194/108 mmHg -   12/06/14 0300 99 ??F (37.2 ??C) (!) 103 24 184/89 mmHg -   12/06/14 0200 98.6 ??F (37 ??C) 95 27 (!) 191/97 mmHg -   12/06/14 0100 98.6 ??F (37 ??C) 99 (!) 31 - 98 %   12/06/14 0005 - (!) 124 26 - 91 %   12/06/14 0000 98.7 ??F (37.1 ??C) (!) 121 19 - (!) 85 %   12/05/14 2316 - - - - 94 %   12/05/14 2300 - 95 25 172/89 mmHg -  12/05/14 2200 98.8 ??F (37.1 ??C) 95 (!) 33 166/84 mmHg -   12/05/14 2100 99.1 ??F (37.3 ??C) (!) 104 26 173/80 mmHg -   12/05/14 2005 - 99 28 (!) 190/150 mmHg -   12/05/14 2000 99.4 ??F (37.4 ??C) 99 29 - -   12/05/14 1900 - 99 (!) 32 (!) 185/91 mmHg -   12/05/14 1845 - (!) 104 28 - 96 %   12/05/14 1830 - (!) 105 27 - 96 %   12/05/14 1815 - (!) 107 29 - -   12/05/14 1800 - (!) 129 27 (!) 157/133 mmHg 98 %   12/05/14 1745 - (!) 103 (!) 31 - 96 %   12/05/14 1730 - (!) 114 25 - 95 %   12/05/14 1715 - (!) 109 28 - 96 %   12/05/14 1700 - (!) 115 29 (!) 214/92 mmHg 95 %   12/05/14 1645 - (!) 117 26 - 93 %   12/05/14 1630 - (!) 110 25 - 95 %   12/05/14 1622 - - - - 93 %   12/05/14 1615 - (!) 110 28 - 94 %   12/05/14 1600 100.4 ??F (38 ??C) (!) 105 30 (!) 173/107 mmHg 91 %   12/05/14 1545 - 97 24 - 96 %   12/05/14 1530 - 99 26 - 98 %   12/05/14 1515 - 95 23 - 94 %   12/05/14 1501 - 90 - (!) 199/110 mmHg -   12/05/14 1500 - 97 24 (!) 197/110 mmHg 97 %   12/05/14 1445 - 97 24 - 97 %       NIHSS Flow Sheet  Total: 1 (12/06/14 1300)       Intake and Output    Intake/Output Summary (Last 24 hours) at 12/06/14 1436  Last data filed at 12/06/14 1300   Gross per 24 hour   Intake 2786.27 ml   Output   3624 ml   Net -837.73 ml       Data    Recent Results (from the past 24 hour(s))   GLUCOSE, POC    Collection Time: 12/05/14  4:43 PM   Result Value Ref Range    Glucose (POC) 99 70 - 110 mg/dL   POTASSIUM    Collection Time: 12/05/14  6:15 PM   Result Value Ref Range     Potassium 3.7 3.5 - 5.5 mmol/L   PHOSPHORUS    Collection Time: 12/05/14  6:15 PM   Result Value Ref Range    Phosphorus 1.8 (L) 2.5 - 4.9 MG/DL   OCCULT BLOOD, STOOL    Collection Time: 12/06/14  1:00 AM   Result Value Ref Range    Occult blood, stool POSITIVE (A) NEG     CBC W/O DIFF    Collection Time: 12/06/14  4:05 AM   Result Value Ref Range    WBC 18.1 (H) 4.6 - 13.2 K/uL    RBC 3.84 (L) 4.20 - 5.30 M/uL    HGB 11.0 (L) 12.0 - 16.0 g/dL    HCT 33.3 (L) 35.0 - 45.0 %    MCV 86.7 74.0 - 97.0 FL    MCH 28.6 24.0 - 34.0 PG    MCHC 33.0 31.0 - 37.0 g/dL    RDW 14.7 (H) 11.6 - 14.5 %    PLATELET 239 135 - 420 K/uL    MPV 10.8 9.2 - 11.8 FL   PROTHROMBIN TIME + INR    Collection Time: 12/06/14  4:05 AM   Result Value Ref Range    Prothrombin time 13.7 11.5 - 15.2 sec    INR 1.1 0.8 - 1.2     PTT    Collection Time: 12/06/14  4:05 AM   Result Value Ref Range    aPTT 31.8 24.6 - 41.3 SEC   METABOLIC PANEL, BASIC    Collection Time: 12/06/14  4:05 AM   Result Value Ref Range    Sodium 138 136 - 145 mmol/L    Potassium 3.7 3.5 - 5.5 mmol/L    Chloride 105 100 - 108 mmol/L    CO2 21 21 - 32 mmol/L    Anion gap 12 3.0 - 18 mmol/L    Glucose 130 (H) 74 - 99 mg/dL    BUN 10 7.0 - 18 MG/DL    Creatinine 0.60 0.6 - 1.3 MG/DL    BUN/Creatinine ratio 17 12 - 20      GFR est AA >60 >60 ml/min/1.51m    GFR est non-AA >60 >60 ml/min/1.793m   Calcium 8.5 8.5 - 10.1 MG/DL   CALCIUM, IONIZED    Collection Time: 12/06/14  4:05 AM   Result Value Ref Range    Ionized Calcium 1.14 1.12 - 1.32 MMOL/L   MAGNESIUM    Collection Time: 12/06/14  4:05 AM   Result Value Ref Range    Magnesium 2.1 1.8 - 2.4 mg/dL   PHOSPHORUS    Collection Time: 12/06/14  4:05 AM   Result Value Ref Range    Phosphorus 2.4 (L) 2.5 - 4.9 MG/DL   AMMONIA    Collection Time: 12/06/14  4:05 AM   Result Value Ref Range    Ammonia 39 (H) 11 - 32 UMOL/L   AMYLASE    Collection Time: 12/06/14  4:05 AM   Result Value Ref Range    Amylase 164 (H) 25 - 115 U/L   LIPASE     Collection Time: 12/06/14  4:05 AM   Result Value Ref Range    Lipase 1616 (H) 73 - 393 U/L   HEPATIC FUNCTION PANEL    Collection Time: 12/06/14  4:05 AM   Result Value Ref Range    Protein, total 7.7 6.4 - 8.2 g/dL    Albumin 2.8 (L) 3.4 - 5.0 g/dL    Globulin 4.9 (H) 2.0 - 4.0 g/dL    A-G Ratio 0.6 (L) 0.8 - 1.7      Bilirubin, total 0.8 0.2 - 1.0 MG/DL    Bilirubin, direct 0.4 (H) 0.0 - 0.2 MG/DL    Alk. phosphatase 120 (H) 45 - 117 U/L    AST 43 (H) 15 - 37 U/L    ALT 39 13 - 56 U/L   CARDIAC PANEL,(CK, CKMB & TROPONIN)    Collection Time: 12/06/14  4:05 AM   Result Value Ref Range    CK 138 26 - 192 U/L    CK - MB 2.4 0.5 - 3.6 ng/ml    CK-MB Index 1.7 0.0 - 4.0 %    Troponin-I, Qt. <0.02 0.0 - 0.045 NG/ML   EKG, 12 LEAD, SUBSEQUENT    Collection Time: 12/06/14  6:56 AM   Result Value Ref Range    Ventricular Rate 118 BPM    Atrial Rate 118 BPM    P-R Interval 126 ms    QRS Duration 82 ms    Q-T Interval 362 ms    QTC Calculation (Bezet) 507 ms    Calculated P  Axis 40 degrees    Calculated R Axis 28 degrees    Calculated T Axis 39 degrees    Diagnosis       Poor data quality, interpretation may be adversely affected  Sinus tachycardia  Possible Left atrial enlargement  Left ventricular hypertrophy  Abnormal ECG  When compared with ECG of 29-Nov-2014 09:31,  Vent. rate has increased BY  55 BPM  Nonspecific T wave abnormality now evident in Inferior leads  Nonspecific T wave abnormality now evident in Anterolateral leads     SODIUM    Collection Time: 12/06/14  9:25 AM   Result Value Ref Range    Sodium 140 136 - 145 mmol/L   PLATELETS, ALLOCATE    Collection Time: 12/06/14 10:00 AM   Result Value Ref Range    Unit number [id number]     Blood component type PLTPH LR,1     Unit division 00     Status of unit ALLOCATED    POTASSIUM    Collection Time: 12/06/14 11:35 AM   Result Value Ref Range    Potassium 3.9 3.5 - 5.5 mmol/L   SODIUM    Collection Time: 12/06/14  1:00 PM   Result Value Ref Range     Sodium 141 136 - 145 mmol/L      Physical Exam  General: Supine, HOB; 30 degrees.  Neuro: Patient opens eyes to verbal stimulation. Patient is not oriented to place and time. Bilateral proptosis. Pupils 2 and reactive. She does follow simple fingers by holding fingers up when asked to do so. She lifts all extremities off bed spontaneously. Right lower extremity more rigidity than left.  Wound: Right drain removed with suture placed.   Pulmonary: Decrease breath sounds throughout.  CV: S1 and S2 heard.  Abd: Obese, NT, ND, NG.   Extremities: Distal pulses 1+.    Assessment/Plan  59 year-old right-handed female who is 8 days s/p SAH HH5 F3 from a ruptured LVA dissecting pseudoaneurysm and 7 days s/p embolization of LVA dissecting fusiform pseudoaneurysm.     1.) Neurologic: Patient underwent cerebral angiography with vasospasm treatment on yesterday. It revealed LVA dissecting fusiform pseudoaneurysm no residual filling. Multifocal moderate vasospasm improved after the intra-arterial administration of vasodilator. Patient more fidgety overnight. SBP autoregulated to 180-220 mmHg. EVD did not drain. Patient restarted on hypertonic saline.     Patient underwent CT of head this morning which revealed stable appearance of the right frontal hemorrhage and areas of subarachnoid hemorrhage.??The ventricular system is stable in size and midline. The subacute infarction in the right cerebral hemisphere is unchanged. Maintain euthermia in vasospasm period as outcomes worse when febrile.       Aspirin to decrease risk of thromboemboli from devices used to sacrifice LVA.      SBP autoregulate. Nimodipine for 21 days; dose split every 2 hours. Maintin euvolemia. Watch for cerebral salt wasting.    PT/OT at bed level.     2.) Cardiovascular: SBP autoregulate and maintain euvolemia from HHE.     3.) Pulmonary: Fair oxygenation on nasal cannula.  Decrease neb frequency and as needed.     4.) Endocrine: Hypothyroidism therapy held as initial TFT suggested hyperthyroidism.  Repeat TFT also low TSH with adequate fT3 and fT4.  Recheck TFTs June 9 and restart levothyroxine as indicated.    5.) Hematology: Hgb stable. LE [place] with no DVT noted.  SCDs. No pharmacologic DVT prophylaxis or treatment for now as may need manipulation of CSF diversion hardware/replacement.  6.) Fluids, Electrolytes, and Nutrition: NPO except meds for potential CSF diversion. Normotonic hydration. Hypertonic saline started. Vitamins given.     7.) Gastrointestinal: GI input appreciated for increase in lipase and amylase.  Amylase and Lipase remain elevated but are trending downward. RUQ [place] and CT unrevealing. PPI switched to intravenous for + hemoccult.  Minimize other medications that might be problematic as indicated. Bowel regimen.     8.) LTD: Foley continues for close I/O.  PICC continues but may need to be replaced as below.     9.) ID: Febrile with increase in WBC count. Input from Dr. Kellie Moor appreciated. Not on antibiotic therapy. Stopped after 7 days. Monitor while off. Antipyretics and external cooling systems to maintain euthermia. Consider endovascular cooling system if refractory.  Repeat studies if temp > 100.5 every 48-72 hours.    Total time spent on neurocritical care was 30 minutes.     Creta Levin, NP

## 2014-12-06 NOTE — Progress Notes (Signed)
Gastrointestinal Progress Note    Patient Name: Renee West    [id number] Date: 12/06/2014    Admit Date: 11/28/2014    Subjective:     Renee West is a 59 y.o. Female who we are seeing b/o elevated serum lipase. The patient continues to be asymptomatic from a GI standpoint. She denies nausea, vomiting, abdominal pain. She is thirsty and hungry. She had two formed BMs yesterday per patient and nurse. She is NPO right now for neurovascular procedure later today.      Current Facility-Administered Medications   Medication Dose Route Frequency   ??? sodium chloride (OCEAN) 0.65 % nasal spray 2 Spray  2 Spray Both Nostrils Q12H   ??? pantoprazole (PROTONIX) tablet 40 mg  40 mg Oral Q12H    Or   ??? pantoprazole (PROTONIX) 40 mg in sodium chloride 0.9 % 10 mL injection  40 mg IntraVENous Q12H   ??? hypertonic saline 3 % (NEURO SCIENCE USE ONLY) infusion  0-1 mL/kg/hr IntraVENous TITRATE   ??? hypertonic saline 3 % (NEURO SCIENCE USE ONLY) infusion  0.25-1 mL/kg/hr IntraVENous TITRATE   ??? dextrose 5% - 0.9% NaCl with KCl 40 mEq/L infusion   IntraVENous CONTINUOUS   ??? albuterol-ipratropium (DUO-NEB) 2.5 MG-0.5 MG/3 ML  3 mL Nebulization Q8H RT   ??? Thiamine Mononitrate (B-1) tablet 100 mg  100 mg Oral DAILY   ??? therapeutic multivitamin (THERAGRAN) tablet 1 Tab  1 Tab Oral DAILY   ??? folic acid (FOLVITE) tablet 1 mg  1 mg Oral DAILY   ??? acetaminophen (TYLENOL) tablet 650 mg  650 mg Oral Q4H PRN   ??? niMODipine (NIMOTOP) capsule 30 mg  30 mg Oral Q2H   ??? aspirin chewable tablet 81 mg  81 mg Oral DAILY    Or   ??? aspirin (ASA) suppository 300 mg  300 mg Rectal DAILY   ??? ELECTROLYTE REPLACEMENT PROTOCOL  1 Each Other Q8H   ??? ELECTROLYTE REPLACEMENT PROTOCOL  1 Each Other Q8H   ??? ELECTROLYTE REPLACEMENT PROTOCOL  1 Each Other Q8H   ??? PHARMACY INFORMATION NOTE  1 Each Other DAILY   ??? ondansetron (ZOFRAN) injection 1 mg  1 mg IntraVENous Q6H PRN   ??? acetaminophen (TYLENOL) suppository 650 mg  650 mg Rectal Q4H PRN    ??? senna-docusate (PERICOLACE) 8.6-50 mg per tablet 2 Tab  2 Tab Oral QHS   ??? bisacodyl (DULCOLAX) tablet 5 mg  5 mg Oral DAILY PRN   ??? bisacodyl (DULCOLAX) suppository 10 mg  10 mg Rectal DAILY PRN   ??? magnesium sulfate 2 g/50 ml IVPB (premix or compounded)  2 g IntraVENous Q12H   ??? albuterol (PROVENTIL VENTOLIN) nebulizer solution 2.5 mg  2.5 mg Nebulization Q4H PRN   ??? 0.9% sodium chloride infusion  5 mL/hr IntraVENous CONTINUOUS   ??? sodium chloride (NS) flush 10 mL  10 mL InterCATHeter PRN   ??? sodium chloride (NS) flush 10-40 mL  10-40 mL InterCATHeter Q8H   ??? sodium chloride (NS) flush 10-30 mL  10-30 mL InterCATHeter PRN   ??? bacitracin 500 unit/gram packet 1 Packet  1 Packet Topical PRN          Objective:     Visit Vitals   Item Reading   ??? BP 185/109 mmHg   ??? Pulse 110   ??? Temp 99.3 ??F (37.4 ??C)   ??? Resp 30   ??? Ht _0  (1.676 m)   ??? Wt 100.9 kg (222 lb  7.1 oz)   ??? BMI 35.92 kg/m2   ??? SpO2 97%       General appearance: alert, cooperative, no distress, appears stated age  Abdomen: soft, non-tender. Bowel sounds normal. No masses,  no organomegaly    Data Review:      Labs: Results:       Chemistry Recent Labs      12/06/14   0405  12/05/14   1815  12/05/14   0330  12/04/14   0345   GLU  130*   --   113*  106*   NA  138   --   143  146*   K  3.7  3.7  3.8  4.2   CL  105   --   110*  112*   CO2  21   --   24  24   BUN  10   --   14  12   CREA  0.60   --   0.64  0.58* CA  8.5   --   8.2*  8.1*   AGAP  12   --   9  10   BUCR  17   --   22*  21*      CBC w/Diff Recent Labs      12/06/14   0405  12/05/14   0330  12/04/14   0345   WBC  18.1*  12.8  14.0*   RBC  3.84*  3.87*  3.93*   HGB  11.0*  11.2*  11.2*   HCT  33.3*  33.5*  33.9*   PLT  239  194  157      Coagulation Recent Labs      12/06/14   0405  12/05/14   0330   PTP  13.7  13.8   INR  1.1  1.1   APTT  31.8  33.2       Liver Enzymes Recent Labs      12/06/14   0405  12/05/14   0330  12/04/14   0345   TP  7.7  7.1  7.3   ALB  2.8*  2.6*  2.7*    TBILI  0.8  0.7  0.8   CBIL  0.4*  0.3*  0.3*   AP  120*  104  105   SGOT  43*  28  23   ALT  39  26  24      Lipase Recent Labs      12/06/14   0405  12/05/14   0330  12/04/14   0345   LPSE  1616*  2021*  1475*               Assessment:   1. Biochemical pancreatitis-----imaging of pancreas normal and patient is without GI symptoms.  ?? significance  2. Elevated Alk Phos and AST today----?? Biliary sludge in the setting of normal biliary tree on imaging, vs meds, IM injections, bone trauma from ventriculostomy    Recommendation:   1. Continue to monitor GI symptoms and exam  2. Monitor LFTS and Lipase  3.  Low fat, regular diet after today's Neuro procedure  4. Further plans depend on clinical course          Wenda Low. Tollie Eth, MD, Quentin Ore, AGAF  December 06, 2014  [facility]ell Level

## 2014-12-07 ENCOUNTER — Inpatient Hospital Stay
Admit: 2014-12-07 | Payer: BLUE CROSS/BLUE SHIELD | Primary: Student in an Organized Health Care Education/Training Program

## 2014-12-07 LAB — POC CHEM8
Anion gap, POC: 20 (ref 10–20)
BUN, POC: 8 MG/DL (ref 7–18)
CO2, POC: 19 MMOL/L (ref 19–24)
Calcium, ionized (POC): 1.15 MMOL/L (ref 1.12–1.32)
Chloride, POC: 109 MMOL/L — ABNORMAL HIGH (ref 100–108)
Creatinine, POC: 0.6 MG/DL (ref 0.6–1.3)
GFRAA, POC: >60 mL/min/{1.73_m2} (ref >60)
GFRNA, POC: >60 mL/min/{1.73_m2} (ref >60)
Glucose, POC: 150 MG/DL — ABNORMAL HIGH (ref 74–106)
Hematocrit, POC: 33 % — ABNORMAL LOW (ref 36–49)
Hemoglobin, POC: 11.2 G/DL — ABNORMAL LOW (ref 12–16)
Potassium, POC: 3.9 MMOL/L (ref 3.5–5.5)
Sodium, POC: 143 MMOL/L (ref 136–145)

## 2014-12-07 LAB — HEPATIC FUNCTION PANEL
A-G Ratio: 0.6 — ABNORMAL LOW (ref 0.8–1.7)
ALT (SGPT): 59 U/L — ABNORMAL HIGH (ref 13–56)
AST (SGOT): 69 U/L — ABNORMAL HIGH (ref 15–37)
Albumin: 2.7 g/dL — ABNORMAL LOW (ref 3.4–5.0)
Alk. phosphatase: 116 U/L (ref 45–117)
Bilirubin, direct: 0.5 MG/DL — ABNORMAL HIGH (ref 0.0–0.2)
Bilirubin, total: 0.9 MG/DL (ref 0.2–1.0)
Globulin: 4.7 g/dL — ABNORMAL HIGH (ref 2.0–4.0)
Protein, total: 7.4 g/dL (ref 6.4–8.2)

## 2014-12-07 LAB — METABOLIC PANEL, BASIC
Anion gap: 10 mmol/L (ref 3.0–18)
BUN/Creatinine ratio: 18 (ref 12–20)
BUN: 11 MG/DL (ref 7.0–18)
CO2: 22 mmol/L (ref 21–32)
Calcium: 8.5 MG/DL (ref 8.5–10.1)
Chloride: 107 mmol/L (ref 100–108)
Creatinine: 0.61 MG/DL (ref 0.6–1.3)
GFR est AA: >60 mL/min/{1.73_m2} (ref >60)
GFR est non-AA: >60 mL/min/{1.73_m2} (ref >60)
Glucose: 118 mg/dL — ABNORMAL HIGH (ref 74–99)
Potassium: 3.8 mmol/L (ref 3.5–5.5)
Sodium: 139 mmol/L (ref 136–145)

## 2014-12-07 LAB — GLUCOSE, POC
Glucose (POC): 129 mg/dL — ABNORMAL HIGH (ref 70–110)
Glucose (POC): 133 mg/dL — ABNORMAL HIGH (ref 70–110)
Glucose (POC): 133 mg/dL — ABNORMAL HIGH (ref 70–110)

## 2014-12-07 LAB — MAGNESIUM
Magnesium: 2.6 mg/dL — ABNORMAL HIGH (ref 1.8–2.4)
Magnesium: 2.2 mg/dL (ref 1.8–2.4)

## 2014-12-07 LAB — CBC W/O DIFF
HCT: 31.5 % — ABNORMAL LOW (ref 35.0–45.0)
HGB: 10.5 g/dL — ABNORMAL LOW (ref 12.0–16.0)
MCH: 28.8 PG (ref 24.0–34.0)
MCHC: 33.3 g/dL (ref 31.0–37.0)
MCV: 86.5 FL (ref 74.0–97.0)
MPV: 10.6 FL (ref 9.2–11.8)
PLATELET: 279 10*3/uL (ref 135–420)
RBC: 3.64 M/uL — ABNORMAL LOW (ref 4.20–5.30)
RDW: 15.2 % — ABNORMAL HIGH (ref 11.6–14.5)
WBC: 20.5 10*3/uL — ABNORMAL HIGH (ref 4.6–13.2)

## 2014-12-07 LAB — PROTHROMBIN TIME + INR
INR: 1.1 (ref 0.8–1.2)
Prothrombin time: 14.1 s (ref 11.5–15.2)

## 2014-12-07 LAB — PTT: aPTT: 23.4 s — ABNORMAL LOW (ref 24.6–37.7)

## 2014-12-07 LAB — CULTURE, CSF W GRAM STAIN
Culture result:: NO GROWTH
GRAM STAIN: NONE SEEN

## 2014-12-07 LAB — POTASSIUM
Potassium: 4.2 mmol/L (ref 3.5–5.5)
Potassium: 4.1 mmol/L (ref 3.5–5.5)

## 2014-12-07 LAB — CALCIUM, IONIZED: Ionized Calcium: 1.1 MMOL/L — ABNORMAL LOW (ref 1.12–1.32)

## 2014-12-07 LAB — PHOSPHORUS
Phosphorus: 1.6 MG/DL — ABNORMAL LOW (ref 2.5–4.9)
Phosphorus: 2.1 MG/DL — ABNORMAL LOW (ref 2.5–4.9)
Phosphorus: 2 MG/DL — ABNORMAL LOW (ref 2.5–4.9)

## 2014-12-07 LAB — LIPASE: Lipase: 1343 U/L — ABNORMAL HIGH (ref 73–393)

## 2014-12-07 LAB — SODIUM
Sodium: 143 mmol/L (ref 136–145)
Sodium: 140 mmol/L (ref 136–145)

## 2014-12-07 LAB — AMMONIA: Ammonia, plasma: 25 umol/L (ref 11–32)

## 2014-12-07 LAB — AMYLASE: Amylase: 154 U/L — ABNORMAL HIGH (ref 25–115)

## 2014-12-07 LAB — OCCULT BLOOD, STOOL: Occult blood, stool: NEGATIVE

## 2014-12-07 MED ORDER — POTASSIUM & SODIUM PHOSPHATES 280 MG-160 MG-250 MG ORAL POWDER PACKET
280-160-250 mg | ORAL | Status: AC
Start: 2014-12-07 — End: 2014-12-07
  Administered 2014-12-07 (×3): via ORAL

## 2014-12-07 MED ORDER — POTASSIUM CHLORIDE SR 20 MEQ TAB, PARTICLES/CRYSTALS
20 mEq | Freq: Once | ORAL | Status: AC
Start: 2014-12-07 — End: 2014-12-07
  Administered 2014-12-07: 12:00:00 via ORAL

## 2014-12-07 MED ORDER — POTASSIUM & SODIUM PHOSPHATES 280 MG-160 MG-250 MG ORAL POWDER PACKET
280-160-250 mg | ORAL | Status: AC
Start: 2014-12-07 — End: 2014-12-07
  Administered 2014-12-07 – 2014-12-08 (×4): via ORAL

## 2014-12-07 MED ORDER — MAGNESIUM HYDROXIDE 400 MG/5 ML ORAL SUSP
400 mg/5 mL | Freq: Every day | ORAL | Status: DC
Start: 2014-12-07 — End: 2014-12-11
  Administered 2014-12-07 – 2014-12-10 (×4): via ORAL

## 2014-12-07 MED ORDER — LIDOCAINE HCL 1 % (10 MG/ML) IJ SOLN
10 mg/mL (1 %) | Freq: Once | INTRAMUSCULAR | Status: AC
Start: 2014-12-07 — End: 2014-12-07
  Administered 2014-12-07: 15:00:00 via SUBCUTANEOUS

## 2014-12-07 MED FILL — MILK OF MAGNESIA 400 MG/5 ML ORAL SUSPENSION: 400 mg/5 mL | ORAL | Qty: 30

## 2014-12-07 MED FILL — PANTOPRAZOLE 40 MG TAB, DELAYED RELEASE: 40 mg | ORAL | Qty: 1

## 2014-12-07 MED FILL — PHOS-NAK 280 MG-160 MG-250 MG ORAL POWDER PACKET: 280-160-250 mg | ORAL | Qty: 2

## 2014-12-07 MED FILL — SENNA PLUS 8.6 MG-50 MG TABLET: ORAL | Qty: 2

## 2014-12-07 MED FILL — NIMODIPINE 30 MG CAP: 30 mg | ORAL | Qty: 1

## 2014-12-07 MED FILL — TYLENOL 325 MG TABLET: 325 mg | ORAL | Qty: 2

## 2014-12-07 MED FILL — THIAMINE MONONITRATE 100 MG TABLET: 100 mg | ORAL | Qty: 1

## 2014-12-07 MED FILL — IPRATROPIUM-ALBUTEROL 2.5 MG-0.5 MG/3 ML NEB SOLUTION: 2.5 mg-0.5 mg/3 ml | RESPIRATORY_TRACT | Qty: 3

## 2014-12-07 MED FILL — FOLIC ACID 1 MG TAB: 1 mg | ORAL | Qty: 1

## 2014-12-07 MED FILL — MAGNESIUM SULFATE 2 GRAM/50 ML IVPB: 2 gram/50 mL (4 %) | INTRAVENOUS | Qty: 50

## 2014-12-07 MED FILL — KLOR-CON M20 MEQ TABLET,EXTENDED RELEASE: 20 mEq | ORAL | Qty: 1

## 2014-12-07 MED FILL — D5-NS WITH POTASSIUM CHLORIDE 40 MEQ/L IV: 40 mEq/L | INTRAVENOUS | Qty: 1000

## 2014-12-07 MED FILL — THERA 400 MCG TABLET: 400 mcg | ORAL | Qty: 1

## 2014-12-07 MED FILL — ASPIRIN 81 MG CHEWABLE TAB: 81 mg | ORAL | Qty: 1

## 2014-12-07 MED FILL — SODIUM CHLORIDE 3 % HYPERTONIC INTRAVENOUS INJECTION SOLUTION: 3 % | INTRAVENOUS | Qty: 500

## 2014-12-07 NOTE — Progress Notes (Signed)
Matched to rehab facilities in the [place] area of [place] per family request. The patients insurance carrier will require an authorization for admission to a rehab facility. Called and left a message for the admission director for [facility] : [facility] [phone])  per family request. The patient has also been matched to [facility] in the event she is not accepted to the rehab facilities in [place]. Lurena Nida, RN

## 2014-12-07 NOTE — Progress Notes (Signed)
INFECTIOUS DISEASE FOLLOW UP NOTE :    Admit Date: 11/28/2014     Current  Prior    Off abx 6/6-2 Vanco/Meropenem 5/30-7     ASSESSMENT: -> RECS     Fever - since admission 5/30  - ?etiology: ?SIRS suspect from [facility], w/ elevated lipase 2008 ?pancreatitis, less likely drug fever  --Lines and EVD new, 1 of mult bctx's bacillus sp, likely contam, serial csf cultures neg so far  -some UA pyuria but uctx 5/30  -CXR 6/4 NAD, rare enterobacter in sputum   -ua 0-3 wbc 0.5% eos, significance unclear without rash or aki   -lipase remains 2000, GI seeing  -6/1 CTH sphenoid sinus AFL likely d/t intubation now extubated and on bsabx  -pvl neg dvt LE 5/31 and 6/5, NEW ANA positive  - EVD out 6/7, PICC dislodged 6/8  - CSF cx's 6/3, 6/4, 6/5, 6/6 NGTD, blcx 6/3 x 2 NGTD  - fever pattern unchanged on cooling blanket -> continue to monitor off abx         biochem pancreatitis pancreas grossly nl on CT   -lipase little-changed today ->appreciate GI input   Mild leukocytosis??  - 15->14k->13->18k today  -> monitor??   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 EVD out today??   Resp failure-resolved  s/p intubation 5/30, extubated 6/3 - per others??   H/o DVT on coumadin- currently on hold  - PVL's neg for acute thrombosis??   - Mx per others??   H/o hypothryoidism, history of thyroid surgery   -exophthalmos, free T4 nl, TSH low- ?on thryoid replacement PTA, ?? Euthyroid Graves  -NEW ANA positive, ? Underlying autoimmune disorder ->check for TSH-receptor Ab   ->await ANA titer/reflex     MICROBIOLOGY:   CSF 5/31, 6/2 ng, 6/3-6 ngtd  5/30  ucx ng   rpr nr  5/31  Blcx x2 ng           Ucx neg           resp cx rare Enterobacter, CF and C albicans  6/3 bctx 1 of 2 bacillus sp    uctx ngtd  6/4 bctx x 2 ngtd    LINES AND CATHETERS:   EVD 5/30  PICC 6/6    SUBJECTIVE :     Interval notes reviewed. Awake, conversing, asking for coffee.  Shivering with cooling blanket.  Low grade fever pattern with cooling blanket unchanged.  Denies tooth aches, cough or joint pains.      Current Facility-Administered Medications   Medication Dose Route Frequency   ??? potassium, sodium phosphates (NEUTRA-PHOS) packet 2 Packet  2 Packet Oral Q2H   ??? potassium chloride (K-DUR, KLOR-CON) SR tablet 20 mEq  20 mEq Oral ONCE   ??? sodium chloride (OCEAN) 0.65 % nasal spray 2 Spray  2 Spray Both Nostrils Q12H   ??? pantoprazole (PROTONIX) tablet 40 mg  40 mg Oral Q12H  Or   ??? pantoprazole (PROTONIX) 40 mg in sodium chloride 0.9 % 10 mL injection  40 mg IntraVENous Q12H   ??? hypertonic saline 3 % (NEURO SCIENCE USE ONLY) infusion  0-1 mL/kg/hr IntraVENous TITRATE   ??? 0.9% sodium chloride infusion 250 mL  250 mL IntraVENous PRN   ??? dextrose 5% - 0.9% NaCl with KCl 40 mEq/L infusion   IntraVENous CONTINUOUS   ??? albuterol-ipratropium (DUO-NEB) 2.5 MG-0.5 MG/3 ML  3 mL Nebulization Q8H RT   ??? Thiamine Mononitrate (B-1) tablet 100 mg  100 mg Oral DAILY   ??? therapeutic multivitamin (THERAGRAN) tablet 1 Tab  1 Tab Oral DAILY   ??? folic acid (FOLVITE) tablet 1 mg  1 mg Oral DAILY   ??? acetaminophen (TYLENOL) tablet 650 mg  650 mg Oral Q4H PRN   ??? niMODipine (NIMOTOP) capsule 30 mg  30 mg Oral Q2H   ??? aspirin chewable tablet 81 mg  81 mg Oral DAILY    Or   ??? aspirin (ASA) suppository 300 mg  300 mg Rectal DAILY    ??? ELECTROLYTE REPLACEMENT PROTOCOL  1 Each Other Q8H   ??? ELECTROLYTE REPLACEMENT PROTOCOL  1 Each Other Q8H   ??? ELECTROLYTE REPLACEMENT PROTOCOL  1 Each Other Q8H   ??? PHARMACY INFORMATION NOTE  1 Each Other DAILY   ??? ondansetron (ZOFRAN) injection 1 mg  1 mg IntraVENous Q6H PRN   ??? acetaminophen (TYLENOL) suppository 650 mg  650 mg Rectal Q4H PRN   ??? senna-docusate (PERICOLACE) 8.6-50 mg per tablet 2 Tab  2 Tab Oral QHS   ??? bisacodyl (DULCOLAX) tablet 5 mg  5 mg Oral DAILY PRN   ??? bisacodyl (DULCOLAX) suppository 10 mg  10 mg Rectal DAILY PRN   ??? magnesium sulfate 2 g/50 ml IVPB (premix or compounded)  2 g IntraVENous Q12H   ??? albuterol (PROVENTIL VENTOLIN) nebulizer solution 2.5 mg  2.5 mg Nebulization Q4H PRN   ??? 0.9% sodium chloride infusion  5 mL/hr IntraVENous CONTINUOUS   ??? sodium chloride (NS) flush 10 mL  10 mL InterCATHeter PRN   ??? sodium chloride (NS) flush 10-40 mL  10-40 mL InterCATHeter Q8H   ??? sodium chloride (NS) flush 10-30 mL  10-30 mL InterCATHeter PRN   ??? bacitracin 500 unit/gram packet 1 Packet  1 Packet Topical PRN          OBJECTIVE     BP 160/88 mmHg   Pulse 107   Temp(Src) 99.9 ??F (37.7 ??C)   Resp 28   Ht 5\' 6"  (1.676 m)   Wt 100 kg (220 lb 7.4 oz)   BMI 35.60 kg/m2   SpO2 92%    Temp (24hrs), Avg:100 ??F (37.8 ??C), Min:98.4 ??F (36.9 ??C), Max:101.6 ??F (38.7 ??C)    GEN - pleasant BF awake and mildly confused in NAD in nicu on cooling blanket, shivering  HEENT: R ventric out (6/7), no thrush.  OU exophthalmos  RESP- no crackles or wheezes  CVS- s1s2 normal, no murmur   ABD- soft, NT, BS present  EXT- no edema , PICC pulled out by pt  SKIN - no rash   NEURO - awake, answers mostly appropriate, sl confused.      Labs: Results:   Chemistry Recent Labs      12/07/14   0348  12/06/14   2215  12/06/14   1540   12/06/14   1135   12/06/14   0405   12/05/14   0330  GLU  118*   --    --    --    --    --   130*   --   113*   NA  139  143  140   < >   --    < >  138   --   143    K  3.8  4.2   --    --   3.9   --   3.7   < >  3.8   CL  107   --    --    --    --    --   105   --   110*   CO2  22   --    --    --    --    --   21   --   24   BUN  11   --    --    --    --    --   10   --   14   CREA  0.61   --    --    --    --    --   0.60   --   0.64 CA  8.5   --    --    --    --    --   8.5   --   8.2*   AGAP  10   --    --    --    --    --   12   --   9   BUCR  18   --    --    --    --    --   17   --   22*   AP  116   --    --    --    --    --   120*   --   104   TP  7.4   --    --    --    --    --   7.7   --   7.1   ALB  2.7*   --    --    --    --    --   2.8*   --   2.6*   GLOB  4.7*   --    --    --    --    --   4.9*   --   4.5*   AGRAT  0.6*   --    --    --    --    --   0.6*   --   0.6*    < > = values in this interval not displayed.      CBC w/Diff Recent Labs      12/07/14   0348  12/06/14   0405  12/05/14   0330   WBC  20.5*  18.1*  12.8   RBC  3.64*  3.84*  3.87*   HGB  10.5*  11.0*  11.2*   HCT  31.5*  33.3*  33.5*   PLT  279  239  194        cxr 6/6 IMPRESSION:  1. Right peripherally inserted central venous catheter present at the level of  the distal superior vena cava.  2.  Moderate pulmonary hypoinflation with associated bronchovascular crowding.      6/7 CTH Stable appearance of the right frontal parenchymal hemorrhage and areas of  subarachnoid hemorrhage.  ??  The midportion of the lateral ventricles measure 9 mm. This area measured  between 8 and 9 mm on the prior study. The temporal horns are minimally more  prominent on the current study. The third ventricle measures 3 mm, prior 2 mm. ??  Mild increase in size of the ventricles (about 1 mm) is suggested in comparison  to the prior study.  ??  A small amount of intraventricular blood is present layering in the posterior  horn of the left lateral ventricle.    [place] 6/3 IMPRESSION:  ??  ??  1. Increased echogenicity of the liver which can be seen with hepatic steatosis   or hepatocellular disease. Exam is otherwise unremarkable.    6/4 CT IMPRESSION:  PANCREAS: Grossly normal  ??  1.?? Streak artifact degrades images. No definite evidence of pancreatitis.  2. Large hiatal hernia with bilateral lower lobe areas of scarring versus  atelectasis.  3. Probable gallbladder sludge without obvious stones. No evidence of acute  cholecystitis.  4. Nonobstructing left nephrolithiasis.  5. Fibroid uterus.  6. No evidence of bowel obstruction. Normal appendix.  7. Induration of the subcutaneous fat bilateral inguinal regions. This could  represent scarring however hematomas from recent vascular procedures could cause  similar appearance. No large hematoma.      Nolon Lennert, MD  December 07, 2014  [facility]  [phone]

## 2014-12-07 NOTE — Progress Notes (Addendum)
Problem: Dysphagia (Adult)  Goal: *Acute Goals and Plan of Care (Insert Text)  Dysphagia Present: Minimal oropharygneal dysphagia     Aspiration: No    Recommendations:  Diet: regular/thin  Meds: whole one at a time, with liquid  Aspiration Precautions  Oral Care TID    Goals: Patient will:  1. Tolerate PO trials with 0 s/s overt distress in 4/5 trials-goal met  2. Utilize compensatory swallow strategies/maneuvers (decrease bite/sip, size/rate, alt. liq/sol) with min cues in 4/5 trials-goal met  3. Perform oral-motor/laryngeal exercises to increase oropharyngeal swallow function with min cues-not indicated  4. Complete an objective swallow study (i.e., MBSS) to assess swallow integrity, r/o aspiration, and determine of safest LRD, min A-not indicated  Outcome: Met/resolved 12/07/14  SPEECH LANGUAGE PATHOLOGY DYSPHAGIA TREATMENT and Discharge    Patient: Renee West (59 y.o. female)  Date: 12/07/2014  Diagnosis: SAH (subarachnoid hemorrhage) (HCC) SAH (subarachnoid hemorrhage) (HCC)       Precautions: aspiration,  Fall (bedrest)      ASSESSMENT:  Pt seen this pm for dysphagia tx with lunch tray. Repositioned at [facility] for adequate po intake prior to tx. Pt req min cues to self-feed, preferring to be fed by SLP.  Accepted mech-soft and thin liquids with no s/s aspiration, both in insolation and with thin used as wash for solids.  Min cues req for pt to decrease rate of po intake &  swallow each bite before next. It should be noted that pt fatigues quickly, however, she remains safe for regular diet.  Re-educated pt on aspiration precautions and importance of compensatory swallow techniques to decrease aspiration risk (decrease rate of intake & sip/bite size, upright @HOB  for all po intake and ~30 minutes after po); verbalized comprehension. Maximum therapeutic gains met; safest, least restrictive diet achieved in current in-patient/acute setting. Accordingly, SLP to d/c intervention at this  time. Results discussed with RN, Janett Billow.     Progression toward goals:  [X]          Improving appropriately and progressing toward goals  [ ]          Improving slowly and progressing toward goals  [ ]          Not making progress toward goals and plan of care will be adjusted       PLAN:  Recommendations and Planned Interventions:  Maximum therapeutic gains met; safest, least restrictive diet achieved. D/C ST intervention at this time.   Discharge Recommendations:  Inpatient Rehab       SUBJECTIVE:   Patient stated ???See you later???.      OBJECTIVE:   Cognitive and Communication Status:  Neurologic State: Alert  Orientation Level: Oriented to place, Oriented to person, Oriented to situation  Cognition: Follows commands  Perception: Appears intact  Perseveration: No perseveration noted  Safety/Judgement: Fall prevention  Dysphagia Treatment:  Oral Assessment:  Oral Assessment  Labial: Decreased seal  Dentition: Natural  Oral Hygiene: good  Lingual: No impairment  Velum: Unable to visualize  Mandible: No impairment  P.O. Trials:              Patient Position: hob 90              Vocal quality prior to P.O.: No impairment              Consistency Presented: Mechanical soft, Thin liquid              How Presented: Self-fed/presented, SLP-fed/presented, Spoon, Straw  Bolus Acceptance: No impairment              Bolus Formation/Control: Impaired              Type of Impairment: Mastication              Propulsion: No impairment              Oral Residue: None              Initiation of Swallow: No impairment              Laryngeal Elevation: Decreased              Aspiration Signs/Symptoms: None              Pharyngeal Phase Characteristics: Poor endurance, Easily fatigued               Effective Modifications: Small sips and bites              Cues for Modifications: Minimal                               Oral Phase Severity: Mild              Pharyngeal Phase Severity : No impairment                  PAIN:  Start of Tx: 0  End of Tx: 0     After treatment:   [ ]               Patient left in no apparent distress sitting up in chair  [X]               Patient left in no apparent distress in bed  [ ]               Call bell left within reach  [X]               Nursing notified  [ ]               Caregiver present  [ ]               Bed alarm activated        COMMUNICATION/EDUCATION:   [X]               Aspiration precautions; compensatory swallow techniques    Eligah East  Time Calculation: 30 mins

## 2014-12-07 NOTE — Other (Addendum)
NUTRITIONAL ASSESSMENT AND PLAN OF CARE     Renee West           59 y.o.           11/28/2014                 1. SAH (subarachnoid hemorrhage) (HCC)           No Cultural, religious or ethnic dietary need identified.     Cultural, religious and ethnic food preferences identified and addressed      Participated in discharge planning/Interdisciplinary rounds   Food allergies:   No          Yes- shrimp  ASSESSMENT:   Pt is 182% ideal wt; BMI (calculated): 38.3 kg/m2 (obese classification).  Pt appears well nourished from available data.    A1C in prediabetes range.  Pt is s/p embolization of LVA pseudoaneurysm (11/30/14), was previously intubated with tube feeding via NGT and following extubation was receiving pureed diet with HTL.  Today's SLP note reviewed with pt now receiving regular food texture and thin liquids. Noted 10 kg weight loss since admission (may be partly related to fluid fluctuations) and pt initially had irregular po intake after tube feeding was discontinued.  Po intake improving and today's po intake appears adequate.  Per chart review, pt with skin tear, inner gluteal fold.    INTERVENTIONS/PLAN:   Will monitor po intake, labs and weights.   Note shrimp allergy.      SUBJECTIVE/OBJECTIVE:   Information obtained from: chart review,pt  Pt reports her weight was stable PTA.  Denies problems chewing or swallowing PTA.  States she is allergic to shrimp ("I break out").    Diet:Regular, 50 gram fat, liberalize salt; no free water' may have Gatorade.  pureed diet, honey thick liquids, cardiac per SLP assessment.      Patient Vitals for the past 100 hrs:   % Diet Eaten   12/07/14 1200 100 %   12/05/14 1800 50 %   12/04/14 1800 25 %   12/04/14 0900 30 %     Medications:                 Reviewed     Most Recent POC Glucose:   Recent Labs      12/07/14   0348  12/06/14   0405  12/05/14   0330   GLU  118*  130*  113*         Labs:   Lab Results   Component Value Date/Time     HEMOGLOBIN A1C 6.4 11/28/2014 11:45 AM     Lab Results   Component Value Date/Time    SODIUM 140 12/07/2014 03:30 PM    POTASSIUM 4.1 12/07/2014 03:30 PM    CHLORIDE 107 12/07/2014 03:48 AM    CO2 22 12/07/2014 03:48 AM    ANION GAP 10 12/07/2014 03:48 AM    GLUCOSE 118 12/07/2014 03:48 AM    BUN 11 12/07/2014 03:48 AM    CREATININE 0.61 12/07/2014 03:48 AM    CALCIUM 8.5 12/07/2014 03:48 AM    MAGNESIUM 2.2 12/07/2014 03:48 AM    PHOSPHORUS 2.0 12/07/2014 03:30 PM    ALBUMIN 2.7 12/07/2014 03:48 AM       Anthropometrics: IBW : 58.968 kg (130 lb), % IBW (Calculated): 182.3 %, BMI (calculated): 38.3  Wt Readings from Last 1 Encounters:   12/07/14 100 kg (220 lb 7.4 oz)     12/01/14 weight - 109.5 kg  Ht Readings from Last 1 Encounters:   11/29/14 5\' 6"  (1.676 m)       Estimated Nutrition Needs: 2198 Kcals/day, Protein (g): 71 g Fluid (ml): 1800 ml  Based on:             Actual BW              IBW               Adjusted BW        Nutrition Diagnoses:   Obesity r/t excessive energy inake AEB BMI of 38.3 kg/m2.  Difficulty swallowing r/t dysphagia AEB pureed diet/HTL.         Nutrition Interventions:  Regular, 50 gram fat, liberalize salt; no free water - may have Gatorade.  Goal:  Po intake meets >75% estimated energy and protein needs.  Weight maintenance (+/- 1-2 kg) by 12/09/14.        Nutrition Monitoring and Evaluation           Monitor po intake on meal rounds       Continue inpatient monitoring and intervention       Other:      Nutrition Risk:     High       Moderate      Minimal/Uncompromised    Renee West, RD   LR pager [phone]

## 2014-12-07 NOTE — Progress Notes (Signed)
Gastrointestinal Progress Note    Patient Name: Renee West    INOMV'E Date: 12/07/2014    Admit Date: 11/28/2014    Subjective:     Renee West is a 59 y.o. Female home we are seeing because of elevated serum lipase.  The patient continues to manifest no active GI symptoms.  She does have mild constipation, nausea, vomiting, or trouble swallowing.  She continues to be NPO for unclear reasons.  She is requesting a laxative.  She denies abdominal pain and states that she is hungry.      Current Facility-Administered Medications   Medication Dose Route Frequency   ??? magnesium hydroxide (MILK OF MAGNESIA) oral suspension 30 mL  30 mL Oral DAILY   ??? potassium, sodium phosphates (NEUTRA-PHOS) packet 2 Packet  2 Packet Oral Q2H   ??? sodium chloride (OCEAN) 0.65 % nasal spray 2 Spray  2 Spray Both Nostrils Q12H   ??? pantoprazole (PROTONIX) tablet 40 mg  40 mg Oral Q12H    Or   ??? pantoprazole (PROTONIX) 40 mg in sodium chloride 0.9 % 10 mL injection  40 mg IntraVENous Q12H   ??? hypertonic saline 3 % (NEURO SCIENCE USE ONLY) infusion  0-1 mL/kg/hr IntraVENous TITRATE   ??? 0.9% sodium chloride infusion 250 mL  250 mL IntraVENous PRN   ??? dextrose 5% - 0.9% NaCl with KCl 40 mEq/L infusion   IntraVENous CONTINUOUS   ??? albuterol-ipratropium (DUO-NEB) 2.5 MG-0.5 MG/3 ML  3 mL Nebulization Q8H RT   ??? Thiamine Mononitrate (B-1) tablet 100 mg  100 mg Oral DAILY   ??? therapeutic multivitamin (THERAGRAN) tablet 1 Tab  1 Tab Oral DAILY   ??? folic acid (FOLVITE) tablet 1 mg  1 mg Oral DAILY   ??? acetaminophen (TYLENOL) tablet 650 mg  650 mg Oral Q4H PRN   ??? niMODipine (NIMOTOP) capsule 30 mg  30 mg Oral Q2H   ??? aspirin chewable tablet 81 mg  81 mg Oral DAILY    Or   ??? aspirin (ASA) suppository 300 mg  300 mg Rectal DAILY   ??? ELECTROLYTE REPLACEMENT PROTOCOL  1 Each Other Q8H   ??? ELECTROLYTE REPLACEMENT PROTOCOL  1 Each Other Q8H   ??? ELECTROLYTE REPLACEMENT PROTOCOL  1 Each Other Q8H    ??? PHARMACY INFORMATION NOTE  1 Each Other DAILY   ??? ondansetron (ZOFRAN) injection 1 mg  1 mg IntraVENous Q6H PRN   ??? acetaminophen (TYLENOL) suppository 650 mg  650 mg Rectal Q4H PRN   ??? magnesium sulfate 2 g/50 ml IVPB (premix or compounded)  2 g IntraVENous Q12H   ??? albuterol (PROVENTIL VENTOLIN) nebulizer solution 2.5 mg  2.5 mg Nebulization Q4H PRN   ??? 0.9% sodium chloride infusion  5 mL/hr IntraVENous CONTINUOUS   ??? sodium chloride (NS) flush 10 mL  10 mL InterCATHeter PRN   ??? sodium chloride (NS) flush 10-40 mL  10-40 mL InterCATHeter Q8H   ??? sodium chloride (NS) flush 10-30 mL  10-30 mL InterCATHeter PRN   ??? bacitracin 500 unit/gram packet 1 Packet  1 Packet Topical PRN          Objective:     Visit Vitals   Item Reading   ??? BP 179/104 mmHg   ??? Pulse 117   ??? Temp 100 ??F (37.8 ??C)   ??? Resp 28   ??? Ht 5\' 6"  (1.676 m)   ??? Wt 100 kg (220 lb 7.4 oz)   ??? BMI 35.60 kg/m2   ???  SpO2 96%       General appearance: alert, cooperative, no distress, appears stated age  Abdomen: soft, non-tender. Bowel sounds normal. No masses,  no organomegaly    Data Review:      Labs: Results:       Chemistry Recent Labs      12/07/14   1530  12/07/14   0348  12/06/14   2215   12/06/14   0405   12/05/14   0330   GLU   --   118*   --    --   130*   --   113*   NA  140  139  143   < >  138   --   143   K  4.1  3.8  4.2   < >  3.7   < >  3.8   CL   --   107   --    --   105   --   110*   CO2   --   22   --    --   21   --   24   BUN   --   11   --    --   10   --   14   CREA   --   0.61   --    --   0.60   --   0.64   CA   --   8.5   --    --   8.5   --   8.2*   AGAP   --   10   --    --   12   --   9   BUCR   --   18   --    --   17   --   22*    < > = values in this interval not displayed.      CBC w/Diff Recent Labs      12/07/14   0348  12/06/14   0405  12/05/14   0330   WBC  20.5*  18.1*  12.8   RBC  3.64*  3.84*  3.87*   HGB  10.5*  11.0*  11.2*   HCT  31.5*  33.3*  33.5*   PLT  279  239  194      Coagulation Recent Labs      12/07/14    0348  12/06/14   0405   PTP  14.1  13.7   INR  1.1  1.1   APTT  23.4*  31.8       Liver Enzymes Recent Labs      12/07/14   0348  12/06/14   0405  12/05/14   0330   TP  7.4  7.7  7.1   ALB  2.7*  2.8*  2.6*   TBILI  0.9  0.8  0.7   CBIL  0.5*  0.4*  0.3*   AP  116  120*  104   SGOT  69*  43*  28   ALT  59*  39  26      Lipase Recent Labs      12/07/14   0348  12/06/14   0405  12/05/14   0330   LPSE  1343*  1616*  2021*               Assessment:   1.  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 The patient should be fed and monitored for symptoms.  ?? Low fat diet necessary but there is no downside at this time  2. Constipation secondary to meds, NPO and inactivity. She needs a gentle oral laxative with dose adjusted to effectiveness  3.  flucutating LFTS----?? Related to meds.  No liver lesions or bile duct lesions on imaging    Recommendation:   1. Low fat diet  2. MOM and adjust the dose to give a soft, solid BM daily  3. Monitor symptoms, exam  4.  Monitor LFTs and avoid potentially hepatotoxic meds  5.  She will need outpatient monitoring of her liver        Jari Favre. Precious Gilding, MD, [facility], AGAF  December 07, 2014  Reece Agar

## 2014-12-07 NOTE — Progress Notes (Addendum)
Neurovascular Progress Note      Patient: Renee West MRN: [id number]  SSN: [id number]    Date of Birth: 19-Sep-1955  Age: 59 y.o.  Sex: female        Event:  Patient's EVD removed yesterday after malfunctioning.     She underwent follow up CT of head this morning. CT of head revealed interval removal right frontal ventriculostomy catheter with no evidence of new hydrocephalus. No new acute intracranial finding or significant interval change. Stable intraparenchymal hematoma and edema right superior frontal lobe, small areas of hypodensity possible areas of infarct right cerebellum, stable small intraventricular hemorrhage and bilateral subarachnoid hemorrhage.    Agitated this morning resulting in PICC line being dislodged.     Vital Signs  Patient Vitals for the past 24 hrs:   Temp Pulse Resp BP SpO2   12/07/14 1200 97.5 ??F (36.4 ??C) - - - -   12/07/14 1100 99.4 ??F (37.4 ??C) - - - -   12/07/14 1000 100 ??F (37.8 ??C) (!) 105 (!) 33 (!) 164/91 mmHg -   12/07/14 0900 (!) 100.8 ??F (38.2 ??C) (!) 117 28 158/89 mmHg -   12/07/14 0801 - - - - 100 %   12/07/14 0800 100.4 ??F (38 ??C) (!) 128 26 173/80 mmHg -   12/07/14 0700 99.9 ??F (37.7 ??C) (!) 107 28 160/88 mmHg -   12/07/14 0600 99 ??F (37.2 ??C) 100 - 178/86 mmHg 92 %   12/07/14 0500 100 ??F (37.8 ??C) (!) 103 23 (!) 182/99 mmHg 95 %   12/07/14 0401 (!) 100.8 ??F (38.2 ??C) - - - -   12/07/14 0400 99.9 ??F (37.7 ??C) (!) 104 24 (!) 172/95 mmHg 95 %   12/07/14 0342 - (!) 120 18 169/72 mmHg 96 %   12/07/14 0300 (!) 101.1 ??F (38.4 ??C) (!) 115 27 (!) 171/95 mmHg -   12/07/14 0200 - (!) 121 24 (!) 177/139 mmHg 99 %   12/07/14 0100 100.1 ??F (37.8 ??C) (!) 106 21 163/85 mmHg 94 %   12/07/14 0001 100.3 ??F (37.9 ??C) - - - -   12/07/14 0000 98.9 ??F (37.2 ??C) (!) 102 28 166/89 mmHg 98 %   12/06/14 2300 (!) 101.2 ??F (38.4 ??C) (!) 114 25 162/73 mmHg 96 %   12/06/14 2205 99.6 ??F (37.6 ??C) - - - -   12/06/14 2204 99 ??F (37.2 ??C) - - - -    12/06/14 2200 (!) 101.6 ??F (38.7 ??C) (!) 116 25 165/87 mmHg 98 %   12/06/14 2100 (!) 101.6 ??F (38.7 ??C) (!) 127 21 (!) 167/94 mmHg 98 %   12/06/14 2030 - (!) 137 21 - 99 %   12/06/14 2015 - (!) 110 26 - 98 %   12/06/14 2000 100.2 ??F (37.9 ??C) (!) 120 23 (!) 171/111 mmHg -   12/06/14 1945 - (!) 116 19 - 95 %   12/06/14 1930 - (!) 121 24 - -   12/06/14 1915 - (!) 110 28 - -   12/06/14 1900 - (!) 107 27 161/86 mmHg -   12/06/14 1830 - (!) 119 22 - 94 %   12/06/14 1800 100.4 ??F (38 ??C) (!) 113 25 (!) 127/104 mmHg 95 %   12/06/14 1745 - (!) 120 22 - 96 %   12/06/14 1730 - (!) 138 29 - -   12/06/14 1727 - 99 - (!) 180/112 mmHg -   12/06/14 1715 - (!) 113 25 - -  12/06/14 1700 (!) 100.8 ??F (38.2 ??C) (!) 114 27 (!) 173/100 mmHg -   12/06/14 1645 - (!) 115 26 - 98 %   12/06/14 1630 (!) 101 ??F (38.3 ??C) (!) 122 23 - 99 %   12/06/14 1615 - (!) 119 26 - 99 %   12/06/14 1600 99.4 ??F (37.4 ??C) (!) 108 (!) 33 171/83 mmHg 98 %   12/06/14 1545 - (!) 101 21 - -   12/06/14 1530 - (!) 110 (!) 32 - -   12/06/14 1515 - (!) 107 26 - -   12/06/14 1500 - (!) 102 (!) 35 (!) 164/98 mmHg -   12/06/14 1445 - 99 (!) 32 - -   12/06/14 1444 - 90 - 146/75 mmHg -   12/06/14 1430 - (!) 111 27 - -   12/06/14 1400 - 94 (!) 37 146/82 mmHg -   12/06/14 1330 - (!) 110 (!) 31 - -   12/06/14 1314 - 88 - (!) 180/112 mmHg -   12/06/14 1300 - (!) 108 23 (!) 172/93 mmHg -       NIHSS Flow Sheet  Total: 0 (12/07/14 1100)     Intake and Output    Intake/Output Summary (Last 24 hours) at 12/07/14 1240  Last data filed at 12/07/14 1000   Gross per 24 hour   Intake 2594.27 ml   Output   3250 ml   Net -655.73 ml     Data    Recent Results (from the past 24 hour(s))   SODIUM    Collection Time: 12/06/14  1:00 PM   Result Value Ref Range    Sodium 141 136 - 145 mmol/L   SODIUM    Collection Time: 12/06/14  3:40 PM   Result Value Ref Range    Sodium 140 136 - 145 mmol/L   SODIUM    Collection Time: 12/06/14 10:15 PM   Result Value Ref Range     Sodium 143 136 - 145 mmol/L   POTASSIUM    Collection Time: 12/06/14 10:15 PM   Result Value Ref Range    Potassium 4.2 3.5 - 5.5 mmol/L   MAGNESIUM    Collection Time: 12/06/14 10:15 PM   Result Value Ref Range    Magnesium 2.6 (H) 1.8 - 2.4 mg/dL   PHOSPHORUS    Collection Time: 12/06/14 10:15 PM   Result Value Ref Range    Phosphorus 1.6 (L) 2.5 - 4.9 MG/DL   GLUCOSE, POC    Collection Time: 12/06/14 11:45 PM   Result Value Ref Range    Glucose (POC) 129 (H) 70 - 110 mg/dL   POC CHEM8    Collection Time: 12/07/14  1:14 AM   Result Value Ref Range    CO2 (POC) 19 19 - 24 MMOL/L    Glucose (POC) 150 (H) 74 - 106 MG/DL    BUN (POC) 8 7 - 18 MG/DL    Creatinine (POC) 0.6 0.6 - 1.3 MG/DL    GFR-AA (POC) >60 >60 ml/min/1.62m    GFR, non-AA (POC) >60 >60 ml/min/1.734m   Sodium (POC) 143 136 - 145 MMOL/L    Potassium (POC) 3.9 3.5 - 5.5 MMOL/L    Calcium, ionized (POC) 1.15 1.12 - 1.32 MMOL/L    Chloride (POC) 109 (H) 100 - 108 MMOL/L    Anion gap (POC) 20 10 - 20      Hematocrit (POC) 33 (L) 36 - 49 %    Hemoglobin (POC)  11.2 (L) 12 - 16 G/DL   CBC W/O DIFF    Collection Time: 12/07/14  3:48 AM   Result Value Ref Range    WBC 20.5 (H) 4.6 - 13.2 K/uL    RBC 3.64 (L) 4.20 - 5.30 M/uL    HGB 10.5 (L) 12.0 - 16.0 g/dL    HCT 31.5 (L) 35.0 - 45.0 %    MCV 86.5 74.0 - 97.0 FL    MCH 28.8 24.0 - 34.0 PG    MCHC 33.3 31.0 - 37.0 g/dL    RDW 15.2 (H) 11.6 - 14.5 %    PLATELET 279 135 - 420 K/uL    MPV 10.6 9.2 - 11.8 FL   PROTHROMBIN TIME + INR    Collection Time: 12/07/14  3:48 AM   Result Value Ref Range    Prothrombin time 14.1 11.5 - 15.2 sec    INR 1.1 0.8 - 1.2     PTT    Collection Time: 12/07/14  3:48 AM   Result Value Ref Range    aPTT 23.4 (L) 24.6 - 62.6 SEC   METABOLIC PANEL, BASIC    Collection Time: 12/07/14  3:48 AM   Result Value Ref Range    Sodium 139 136 - 145 mmol/L    Potassium 3.8 3.5 - 5.5 mmol/L    Chloride 107 100 - 108 mmol/L    CO2 22 21 - 32 mmol/L    Anion gap 10 3.0 - 18 mmol/L     Glucose 118 (H) 74 - 99 mg/dL    BUN 11 7.0 - 18 MG/DL    Creatinine 0.61 0.6 - 1.3 MG/DL    BUN/Creatinine ratio 18 12 - 20      GFR est AA >60 >60 ml/min/1.11m    GFR est non-AA >60 >60 ml/min/1.76m   Calcium 8.5 8.5 - 10.1 MG/DL   CALCIUM, IONIZED    Collection Time: 12/07/14  3:48 AM   Result Value Ref Range    Ionized Calcium 1.10 (L) 1.12 - 1.32 MMOL/L   MAGNESIUM    Collection Time: 12/07/14  3:48 AM   Result Value Ref Range    Magnesium 2.2 1.8 - 2.4 mg/dL   PHOSPHORUS    Collection Time: 12/07/14  3:48 AM   Result Value Ref Range    Phosphorus 2.1 (L) 2.5 - 4.9 MG/DL   AMMONIA    Collection Time: 12/07/14  3:48 AM   Result Value Ref Range    Ammonia 25 11 - 32 UMOL/L   AMYLASE    Collection Time: 12/07/14  3:48 AM   Result Value Ref Range    Amylase 154 (H) 25 - 115 U/L   LIPASE    Collection Time: 12/07/14  3:48 AM   Result Value Ref Range    Lipase 1343 (H) 73 - 393 U/L   HEPATIC FUNCTION PANEL    Collection Time: 12/07/14  3:48 AM   Result Value Ref Range    Protein, total 7.4 6.4 - 8.2 g/dL    Albumin 2.7 (L) 3.4 - 5.0 g/dL    Globulin 4.7 (H) 2.0 - 4.0 g/dL    A-G Ratio 0.6 (L) 0.8 - 1.7      Bilirubin, total 0.9 0.2 - 1.0 MG/DL    Bilirubin, direct 0.5 (H) 0.0 - 0.2 MG/DL    Alk. phosphatase 116 45 - 117 U/L    AST 69 (H) 15 - 37 U/L    ALT 59 (H)  13 - 56 U/L   GLUCOSE, POC    Collection Time: 12/07/14 11:59 AM   Result Value Ref Range    Glucose (POC) 133 (H) 70 - 110 mg/dL      Physical Exam  General: Supine, HOB; 65 degrees.  Neuro: Patient eyelids open. Bilateral proptosis. Pupils 2 and reactive. She is able to count fingers in all four visual fields. She lifts all extremities off bed spontaneously. Left lower extremity more rigidity than right.  Wound: Suture at old EVD entrance site.   Pulmonary: Decrease breath sounds throughout.  CV: S1 and S2 heard.  Abd: Obese, NT, ND, NG.   Extremities: Distal pulses 1+.    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     1.) Neurologic: Patient agitated this morning resulting in PICC dislodged. Patient's EVD removed yesterday after malfunctioning.   She underwent follow up CT of head this morning. CT of head revealed interval removal right frontal ventriculostomy catheter with no evidence of new hydrocephalus. No new acute intracranial finding or significant interval change. Stable intraparenchymal hematoma and edema right superior frontal lobe, small areas of hypodensity possible areas of infarct right cerebellum, stable small intraventricular hemorrhage and bilateral subarachnoid hemorrhage.    Multifocal moderate vasospasm improved after the intra-arterial administration of vasodilator two days ago. SBP autoregulated. EVD did not drain. Patient restarted on hypertonic saline after PICC replaced.     Aspirin to decrease risk of thromboemboli from devices used to sacrifice LVA.      Nimodipine for 21 days; dose split every 2 hours. Maintin euvolemia. Watch for cerebral salt wasting.    PT/OT at bed level.     2.) Cardiovascular: SBP autoregulate and maintain euvolemia from HHE.     3.) Pulmonary: Fair oxygenation on nasal cannula.  Decrease neb frequency and as needed.    4.) Endocrine: Hypothyroidism therapy held as initial TFT suggested hyperthyroidism.  Repeat TFT also low TSH with adequate fT3 and fT4.  Recheck TFTs tomorrow and restart levothyroxine as indicated.    5.) Hematology: Hgb stable. LE [place] with no DVT noted.  SCDs. No pharmacologic DVT prophylaxis or treatment for now as may need manipulation of CSF diversion hardware/replacement.      6.) Fluids, Electrolytes, and Nutrition: Patient restarted on PO intake. Normotonic hydration. Hypertonic saline started. Vitamins given.      7.) Gastrointestinal: GI input appreciated for increase in lipase and amylase.  Amylase and Lipase remain elevated but are trending downward. RUQ [place] and CT unrevealing. PPI switched to intravenous for + hemoccult.  Minimize other medications that might be problematic as indicated. Bowel regimen.     8.) LTD: Foley continues for close I/O.  PICC replaced this morning.    9.) ID: Febrile with increase in WBC count. Input from ID appreciated. Not on antibiotic therapy. Stopped after 7 days. Monitor while off. Antipyretics and external cooling systems to maintain euthermia. Consider endovascular cooling system if refractory.  Repeat studies if temp > 100.5 every 48-72 hours.    Total time spent on neurocritical care was 30 minutes.     Creta Levin, NP

## 2014-12-07 NOTE — Progress Notes (Signed)
Problem: Mobility Impaired (Adult and Pediatric)  Goal: *Acute Goals and Plan of Care (Insert Text)  PHYSICAL THERAPY SHORT TERM GOALS : while on bedrest   1. Patient will perform/completeroll to both sides with supervision/set-up within 1 week(s)12-14-14  2. Patient will perform/completescoot up in bed with supervision/set-up within 1 week(s).12-14-14  3. Patient will participate in lower extremity therapeutic exercise/activities with supervision/set-up for 10 minutes within 1 week(s)12-14-14  PHYSICAL THERAPY SHORT TERM GOALS :when off bedrest   1. Patient will perform/complete supine to sitting with minimal assistance/contact guard assist within 1 week(s6-15-16  2. Patient will perform/complete sit to stand with moderate assistance within 1 week(s).12-14-14  3. Patient will perform/complete bed to chair with moderate assistance within 1 week(s).12-14-14  4. Patient will perform/completeambulate for 25 feet and least restrictive device with moderate assistance within 1 week(s).12-14-14  5. Patient will participate in lower extremity therapeutic exercise/activities with modified independence for 15 minutes within 1 week(s).12-14-14  Therapist Burnett HarryJanice K Trista Ciocca, PT 12/07/2014  Time Calculation: 13 mins  STG for 5.31.16 to be completed by 6.7.16 (7 days).  1. Pt will complete supine therex PROM[facility]AROM [facility] in order to decrease risk of contractures and maintain current strength.   Outcome: Progressing Towards Goal  PHYSICAL THERAPY RE-EVALUATION AND TREATMENT    Patient: Renee West (59 y.o. female)  Date: 12/07/2014  Diagnosis: SAH (subarachnoid hemorrhage) (HCC) SAH (subarachnoid hemorrhage) (HCC)       Precautions: Fall      ASSESSMENT:  Patient re-evaluated following one week of treatment and evd removal  Patient is to stay on bed rest per [employer]rystal Baker note..    During re-evaluation patient  With  Active movement of all extremities. Rolling bed with cga to supervision.  Left in bed with call light in reach and no  pain reported during session she did report earlier pain was in  right hip  And is gone now.  Education on goals of therapy while in hospital verbalized understanding.   .  Patient demonstrates fair rehab potential due to higher previous functional level.Marland Kitchen.            PLAN:  Goals have been updated based on progression since last assessment.  Patient continues to benefit from skilled intervention to address the above impairments.  Continue to follow the patient 1-2 times per day/4-7 days per week to address goals.  Planned Interventions:  [X]      Bed Mobility Training          [X]      Neuromuscular Re-Education  [X]      Transfer Training                [ ]     Orthotic/Prosthetic Training  [X]      Gait Training                       [ ]      Modalities  [X]      Therapeutic Exercises       [ ]      Edema Management/Control  [X]      Therapeutic Activities         [X]      Patient and Family Training/Education  [ ]      Other (comment):  Discharge Recommendations: Rehab and Skilled Nursing Facility  Further Equipment Recommendations for Discharge: rolling walker and tbd prior to going home.        SUBJECTIVE:   Patient stated ???.???      OBJECTIVE  DATA SUMMARY:   GCODES(GP):n/a  Critical Behavior:  Neurologic State: Alert  Orientation Level: Oriented X4  Cognition: Follows commands  Safety/Judgement: Fall prevention  Tone & Sensation:   Tone: Normal  Sensation: Intact  Range Of Motion:  AROM: Within functional limits  PROM: Within functional limits  Strength:    Strength: Generally decreased, functional  Functional Mobility Training:  Bed Mobility:  Rolling: Contact guard assistance;Stand-by asssistance;Additional time;Assist x1  Supine to Sit:  (bedrest)  Neuro Re-Education and Therapeutic Exercises:   Arom/aarom to all extremities.   Pain:  Pain Scale 1: Numeric (0 - 10)  Pain Intensity 1: 0  Activity Tolerance:   Fair   Please refer to the flowsheet for vital signs taken during this treatment.  After treatment:      Patient left in no apparent distress sitting up in chair    Patient left in no apparent distress in bed    Call bell left within reach    Nursing notified    Caregiver present    Bed alarm activated    Patient education:  Continuing to provide education to increase safety awareness and increased mobility verbalized understanding. Freddie Breech Kezia Benevides, PT   Time Calculation: 13 mins

## 2014-12-07 NOTE — Other (Addendum)
0700: Assumed care of patient. AOx3, NIH 0, Restless and talkative, Left leg rigidity. Skin tear on inner gluteal fold with mepilex in place.   0830: Temperature elevated >100.5. Tylenol administered. Patient remains on cooling blanket. Will continue to monitor.  1000: PICC nurse at the bedside.   1400: BMx1, occult stool sample sent to lab  1600: Sodium is 140. Hypertonic Saline rate remains unchanged.   1800: BMx1  Bedside shift change report given to Shanda Bumps, Charity fundraiser (oncoming nurse) by Shanda Bumps, RN (offgoing nurse). Report included the following information SBAR, Kardex, ED Summary, Procedure Summary, Intake/Output, MAR, Accordion, Recent Results, Med Rec Status and Cardiac Rhythm Sinus Tach.

## 2014-12-07 NOTE — Progress Notes (Signed)
[facility] [place] [place].  She requested additional note which were faxed to here via e discharge.  She stated the [facility].  Lurena NidaAngela  L Baker, RN

## 2014-12-07 NOTE — Other (Signed)
Patient Name [place] Sex DOB SSN Address Phone     Renee West, Renee West [id number] Female 1956/06/14 [id number] [address]  [place] [address] [phone] (Home)        CSN:     [id number]        [place] Date: Admit Time Room Bed     Nov 28, 2014 2:25 AM 2604 [[id number]] [phone] [[id number]]        Attending Providers      Provider Pager From To     Lamonte Sakai, DO  11/28/14 11/28/14     Vira Blanco, MD  11/28/14         Emergency Contact(s)      Name Relation Home Work [place] R Sister [phone]         Utilization Review          Neurology [place] - Care Day 10 (12/07/2014) by Camelia Eng, RN      Review Entered Review Status     12/07/2014 Completed     Details          Care Day: 10 Care Date: 12/07/2014 Level of Care:      Guideline Day 2      Clinical Status     ( ) * No ICU or intermediate care needs          Interventions     (X) Inpatient interventions continue          12/07/2014 9:55 AM EDT by Janean Sark     Subject: Additional Clinical Information     T 101.1 P 115 R 27 BP 171/95 sat 96 RA    Awake, conversing, asking for coffee. Shivering with cooling blanket. Low grade fever pattern with cooling blanket unchanged. Denies tooth aches, cough or joint pains.  Blcx x2 ngUcx negresp cx rare Enterobacter, CF and C albicans    glucose 150 chl 109 hgb 10.5 hct 31.5 wbc 20.5 ALT 59 AST 69 lipase 1343     ICU, regular 50gm fat diet, sodium q6hrs, continuous pulse oximetry, ST//PT/OT, VS continuous, neurochecks continuous, Duonebs q8hrs, D5NS with 36mq KCL @ 80cc/hr, Hypertonic saline 3% IV, mag sulfate 2g IV q12hrs,                          * Milestone             Neurology [facility] - Care Day 9 (12/06/2014) by HCamelia Eng RN      Review Entered Review Status     12/06/2014 Completed     Details          Care Day: 9 Care Date: 12/06/2014 Level of Care:      Guideline Day 2      Clinical Status     ( ) * No ICU or intermediate care needs          Interventions     (X) Inpatient interventions continue           12/06/2014 2:46 PM EDT by MJanean Sark    Subject: Additional Clinical Information     T 99.4 P 101 R 28 BP 175/83 sat 96 RA    ICU, NPO, sodium q6hrs, continuous pulse oximetry, PT/OT/OT, VS continuous, neurochecks hourly, bedrest, Duonebs q8hrs, D5NS with 453m KCL @ 80cc/hr, Hypertonic saline 3% IV, mag sulfate 2g IV q12hrs, Fentanyl 2536mIV     wbc 18.1 hgb  11.0 hct 33.3 glucose 130 AST 43 alk phos 120 amylase 164 lipase 1616 ammonia 39     CT head-Stable appearance of the right frontal rectal hemorrhage and areas ofsubarachnoid hemorrhage. The ventricular system is stable in size and midline.The subacute infarction in the right cerebral hemisphere is unchangedpt with episodes of SVT over last 12 hrs  stool specimen blood this am  awaiting MD rounds                           * Milestone

## 2014-12-07 NOTE — Other (Signed)
PICC tip seen in lower SVC, released for use, RN notified.

## 2014-12-07 NOTE — Progress Notes (Addendum)
1900- Assumed care of pt. Report received at bedside by Gwenlyn Found RN. Dual NIHSS completed at this time.    2000- Assessment complete. Pt awake alert able to state name/place/time/situation, MAE, FC, +3 PERRL, NIHSS 0, ST on the monitor, pulses palpable, SCDs to BLE, lungs clear to ascultation RA, ab obese soft non tender, + bowel sounds, foley draining amber urine, pt has no complaints of pain or discomfort. Skin tear in gluteal folds- mepilex replaced. Old EVD site to R skull OTA.    T 100.7, PRN tylenol given.    2200- T 99, Cooling blanket off at this time.    2322- Na 143, HTS held at this time    0000- Reassessment done. No neuro changes noted. No complaints of pain or discomfort. T 99.9, cooling blanket turned back on at this time.    0030-  Pt found in bed with PICC line at side. PIV placed to R Ac 22G    0032- Spoke to Dr. Excell Seltzer in regards to pt condition as PICC line has been removed again. Neuro status remains intact.     0400- Reassessment done. No changes noted. No complaints of pain or discomfort.     0535- T 101.5, PRN tylenol given    0700- Bedside and Verbal shift change report given to Vernona Rieger RN (oncoming nurse) by Marcy Siren, RN  (offgoing nurse). Report included the following information  SBAR, Kardex, Intake/Output, MAR, Recent Results and Cardiac Rhythm ST.   Dual NIHSS completed at this time.    Bland Span at beside assessing pt as well.

## 2014-12-07 NOTE — Progress Notes (Signed)
Problem: Self Care Deficits Care Plan (Adult)  Goal: *Acute Goals and Plan of Care (Insert Text)  Occupational Therapy Goals  Initiated 11/30/2014 within 7 day(s), re-evaluated 12/07/14 goals updated.    While bed level:  1. Patient will perform grooming with supervision/set-up   2. Patient will perform upper body dressing with supervision/set-up.  3. Patient will participate in upper extremity therapeutic exercise/activities with supervision/set-up for 10 minutes.   4. Patient will utilize energy conservation techniques during functional activities with verbal cues.    Once bedrest orders removed:    5. Patient will perform lower body dressing with minimal assistance/contact guard assist.  6. Patient will perform toilet transfers with minimal assistance/contact guard assist.  7. Patient will perform all aspects of toileting with minimal assistance/contact guard assist.  Outcome: Progressing Towards Goal  OCCUPATIONAL THERAPY REEVALUATION    Patient: Renee West (59 y.o. female)  Date: 12/07/2014  Diagnosis: SAH (subarachnoid hemorrhage) (HCC) SAH (subarachnoid hemorrhage) (HCC)       Precautions: Fall (bedrest)      ASSESSMENT :  Based on the objective data described below, the patient presents with good upper extremity ROM, coordination, and strength at bed level. Pt now with EVD removed, however per Marigene Ehlers, NP, notes continue to see patient at bed level. Pt performed UE exercises at bed level. Pt would benefit from skilled OT services to increase independence and safety with ADL tasks/transfers.     Education: Reviewed plan of care and importance of exercises.      Patient will benefit from skilled intervention to address the above impairments.  Patient???s rehabilitation potential is considered to be Good  Factors which may influence rehabilitation potential include:                   None noted                  Mental ability/status                  Medical condition                   Home/family situation and support systems                  Safety awareness                  Pain tolerance/management                  Other:        PLAN :  Recommendations and Planned Interventions:                    Self Care Training                             Therapeutic Activities                    Functional Mobility Training               Cognitive Retraining                    Therapeutic Exercises                      Endurance Activities    Balance Training                   [X]            Neuromuscular Re-Education  [ ]                   Visual/Perceptual Training     [X]       Home Safety Training  [X]                   Patient Education                 [X]            Family Training/Education  [ ]                   Other (comment):    Frequency/Duration: Patient will be followed by occupational therapy 1-2 times per day/4-7 days per week to address goals.  Discharge Recommendations: TBD when OOB ADLs and transfers are assessed  Further Equipment Recommendations for Discharge: TBD when OOB ADLs and transfers are assessed       SUBJECTIVE:   Patient stated ???.???      OBJECTIVE DATA SUMMARY:   Hospital course since last seen and reason for reevaluation: Patient has been on OT caseload x1 week now requiring weekly re-evaluation. Since initial evaluation, pt has had EVD removed and has been extubated.   GCODES(GO):n/a  Cognitive/Behavioral Status:  Neurologic State: Alert  Orientation Level: Oriented to person, Oriented to place, Oriented to situation  Cognition: Follows commands  Safety/Judgement: Fall prevention  Coordination:  Coordination: Within functional limits  Fine Motor Skills-Upper: Left Intact;Right Intact    Gross Motor Skills-Upper: Left Intact;Right Intact  Strength:  Strength: Generally decreased, functional     Range of Motion:  AROM: Within functional limits  PROM: Within functional limits  ADL Assessment:   Oral Facial Hygiene/Grooming: Maximum assistance (for chapstick)        ADL Intervention:  Cognitive Retraining  Safety/Judgement: Fall prevention    Therapeutic Exercise:  BUE AROM x10 reps (shoulder flexion, elbow flexion/extension, wrist flexion/extension, grip)  Pain:    Pre treatment: 0/10  Post treatment: 0/10  Activity Tolerance:   fair  Please refer to the flowsheet for vital signs taken during this treatment.  After treatment:   [ ]  Patient left in no apparent distress sitting up in chair  [X]  Patient left in no apparent distress in bed  [X]  Call bell left within reach  [X]  Nursing notified  [ ]  Caregiver present  [ ]  Bed alarm activated      COMMUNICATION/EDUCATION:   [ ]     Home safety education was provided and the patient/caregiver indicated understanding.  [X]     Patient/family have participated as able in goal setting and plan of care.  [X]     Patient/family agree to work toward stated goals and plan of care.  [ ]     Patient understands intent and goals of therapy, but is neutral about his/her participation.  [ ]     Patient is unable to participate in goal setting and plan of care.  This patient???s plan of care is appropriate for delegation to OTA.    Thank you for this referral.  Lora Paula, MS OTR/L  Time Calculation: 12 mins

## 2014-12-07 NOTE — Progress Notes (Addendum)
2100- Assumed care of pt. Report received at bedside by Lauren RN. Dual NIHSS completed at this time.     Assessment complete. Pt awake alert able to state name/place/time/situation, MAE, FC, +3 PERRL, NIHSS 0, ST on the monitor, pulses palpable, SCDs to BLE, lungs clear to ascultation RA, ab obese soft non tender, + bowel sounds, foley draining amber urine, pt states back pain currently- repositioned for comfort. Skin tear in gluteal folds- mepilex replaced. Old EVD site to R skull dressed with tegaderm.     2158- Bladder T 101.6, Oral T 99, Axillary T 99.6. PRN tylenol given     0000- Lab has notified RNs that system is down in regards to chemistry results, 2200 labs have not been resulted due to the system being down. iSTAT Chem8 ordered for Na result for HTS.    Reassessment done. No changes noted. Pt has no complaints of pain or discomfort.     0015- Cartridge expired on iSTAT.     0116- iSTAT Na 143, HTS held at this time    0306- T 101.1, PRN tylenol given.     1607-3710 Pt taken to CT and back with RNx2 in stable condition     0400- Reassessment done. No changes noted. No complaints of pain or discomfort. Pt has not slept all night.     0545- Pt pulled out R upper PICC, all leads removed as well. Pt heard tossing and turning in bed as well as mumbling. When asked about PICC removal "I dont remember pulling it out. I was dreaming" Catheter tip still intact. NIHSS 0. Neuro remains unchanged. Pupils +3 PERRL bil. Successful placement of 22G to R Ac after RNx3 with 8 attempts total     Dr. Excell Seltzer paged    [phone]- Na 139, HTS unable to be restarted with 22G PIV    0653- Spoke to Dr. Excell Seltzer in regards to pt not sleeping/restlessness, pt removing PICC line, Na level, T - pt remains on cooling blanket throughout shift, WBC increasing   Orders received: STAT PICC line insertion     0700- Bedside and Verbal shift change report given to Fisher C. (oncoming  nurse) by Marcy Siren, RN  (offgoing nurse). Report included the following information SBAR, Kardex, Procedure Summary, Intake/Output, MAR, Recent Results and Cardiac Rhythm ST.   Dual NIHSS completed at this time.    Marigene Ehlers NP at bedside.

## 2014-12-08 LAB — CBC W/O DIFF
HCT: 31.9 % — ABNORMAL LOW (ref 35.0–45.0)
HGB: 10.5 g/dL — ABNORMAL LOW (ref 12.0–16.0)
MCH: 28.6 PG (ref 24.0–34.0)
MCHC: 32.9 g/dL (ref 31.0–37.0)
MCV: 86.9 FL (ref 74.0–97.0)
MPV: 10.8 FL (ref 9.2–11.8)
PLATELET: 375 10*3/uL (ref 135–420)
RBC: 3.67 M/uL — ABNORMAL LOW (ref 4.20–5.30)
RDW: 15.5 % — ABNORMAL HIGH (ref 11.6–14.5)
WBC: 21.6 10*3/uL — ABNORMAL HIGH (ref 4.6–13.2)

## 2014-12-08 LAB — TSH 3RD GENERATION: TSH: 0.49 u[IU]/mL (ref 0.36–3.74)

## 2014-12-08 LAB — HEPATIC FUNCTION PANEL
A-G Ratio: 0.6 — ABNORMAL LOW (ref 0.8–1.7)
ALT (SGPT): 129 U/L — ABNORMAL HIGH (ref 13–56)
AST (SGOT): 135 U/L — ABNORMAL HIGH (ref 15–37)
Albumin: 2.8 g/dL — ABNORMAL LOW (ref 3.4–5.0)
Alk. phosphatase: 134 U/L — ABNORMAL HIGH (ref 45–117)
Bilirubin, direct: 0.4 MG/DL — ABNORMAL HIGH (ref 0.0–0.2)
Bilirubin, total: 0.8 MG/DL (ref 0.2–1.0)
Globulin: 5 g/dL — ABNORMAL HIGH (ref 2.0–4.0)
Protein, total: 7.8 g/dL (ref 6.4–8.2)

## 2014-12-08 LAB — METABOLIC PANEL, BASIC
Anion gap: 10 mmol/L (ref 3.0–18)
BUN/Creatinine ratio: 17 (ref 12–20)
BUN: 11 MG/DL (ref 7.0–18)
CO2: 24 mmol/L (ref 21–32)
Calcium: 7.7 MG/DL — ABNORMAL LOW (ref 8.5–10.1)
Chloride: 108 mmol/L (ref 100–108)
Creatinine: 0.66 MG/DL (ref 0.6–1.3)
GFR est AA: >60 mL/min/{1.73_m2} (ref >60)
GFR est non-AA: >60 mL/min/{1.73_m2} (ref >60)
Glucose: 111 mg/dL — ABNORMAL HIGH (ref 74–99)
Potassium: 4.1 mmol/L (ref 3.5–5.5)
Sodium: 142 mmol/L (ref 136–145)

## 2014-12-08 LAB — TYPE & SCREEN
ABO/Rh(D): AB POS
Antibody screen: NEGATIVE

## 2014-12-08 LAB — CULTURE, CSF W GRAM STAIN
Culture result:: NO GROWTH
GRAM STAIN: NONE SEEN

## 2014-12-08 LAB — CULTURE, BLOOD: Culture result:: NO GROWTH

## 2014-12-08 LAB — PLATELETS, ALLOCATE: Unit division: 0

## 2014-12-08 LAB — AMYLASE: Amylase: 141 U/L — ABNORMAL HIGH (ref 25–115)

## 2014-12-08 LAB — PROTHROMBIN TIME + INR
INR: 1.1 (ref 0.8–1.2)
Prothrombin time: 14 s (ref 11.5–15.2)

## 2014-12-08 LAB — T4, FREE: T4, Free: 1 NG/DL (ref 0.7–1.5)

## 2014-12-08 LAB — LIPASE: Lipase: 1121 U/L — ABNORMAL HIGH (ref 73–393)

## 2014-12-08 LAB — SODIUM: Sodium: 143 mmol/L (ref 136–145)

## 2014-12-08 LAB — AMMONIA: Ammonia, plasma: 19 umol/L (ref 11–32)

## 2014-12-08 LAB — TSH RECEPTOR AB: Thyrotropin Receptor Ab, serum: 4.13 IU/L — ABNORMAL HIGH (ref 0.00–1.75)

## 2014-12-08 LAB — EKG, 12 LEAD, SUBSEQUENT
Atrial Rate: 118 {beats}/min
Calculated P Axis: 40 degrees
Calculated R Axis: 28 degrees
Calculated T Axis: 39 degrees
P-R Interval: 126 ms
Q-T Interval: 362 ms
QRS Duration: 82 ms
QTC Calculation (Bezet): 507 ms
Ventricular Rate: 118 {beats}/min

## 2014-12-08 LAB — T3, FREE: Triiodothyronine (T3), free: 2.4 PG/ML (ref 2.3–4.2)

## 2014-12-08 LAB — PHOSPHORUS: Phosphorus: 2.9 MG/DL (ref 2.5–4.9)

## 2014-12-08 LAB — GLUCOSE, POC: Glucose (POC): 107 mg/dL (ref 70–110)

## 2014-12-08 LAB — CALCIUM, IONIZED: Ionized Calcium: 1.1 MMOL/L — ABNORMAL LOW (ref 1.12–1.32)

## 2014-12-08 LAB — MAGNESIUM: Magnesium: 2.6 mg/dL — ABNORMAL HIGH (ref 1.8–2.4)

## 2014-12-08 LAB — PTT: aPTT: 28.1 s (ref 24.6–37.7)

## 2014-12-08 LAB — TYPE AND SCREEN
ABO/Rh: AB POS
Antibody Screen: NEGATIVE

## 2014-12-08 MED ORDER — LEVOTHYROXINE 50 MCG TAB
50 mcg | Freq: Every day | ORAL | Status: DC
Start: 2014-12-08 — End: 2014-12-12
  Administered 2014-12-09 – 2014-12-12 (×4): via ORAL

## 2014-12-08 MED FILL — PHOS-NAK 280 MG-160 MG-250 MG ORAL POWDER PACKET: 280-160-250 mg | ORAL | Qty: 2

## 2014-12-08 MED FILL — NIMODIPINE 30 MG CAP: 30 mg | ORAL | Qty: 1

## 2014-12-08 MED FILL — TYLENOL 325 MG TABLET: 325 mg | ORAL | Qty: 2

## 2014-12-08 MED FILL — IPRATROPIUM-ALBUTEROL 2.5 MG-0.5 MG/3 ML NEB SOLUTION: 2.5 mg-0.5 mg/3 ml | RESPIRATORY_TRACT | Qty: 3

## 2014-12-08 MED FILL — MILK OF MAGNESIA 400 MG/5 ML ORAL SUSPENSION: 400 mg/5 mL | ORAL | Qty: 30

## 2014-12-08 MED FILL — THIAMINE MONONITRATE 100 MG TABLET: 100 mg | ORAL | Qty: 1

## 2014-12-08 MED FILL — MAGNESIUM SULFATE 2 GRAM/50 ML IVPB: 2 gram/50 mL (4 %) | INTRAVENOUS | Qty: 50

## 2014-12-08 MED FILL — ASPIRIN 81 MG CHEWABLE TAB: 81 mg | ORAL | Qty: 1

## 2014-12-08 MED FILL — PANTOPRAZOLE 40 MG TAB, DELAYED RELEASE: 40 mg | ORAL | Qty: 1

## 2014-12-08 MED FILL — THERA 400 MCG TABLET: 400 mcg | ORAL | Qty: 1

## 2014-12-08 MED FILL — FOLIC ACID 1 MG TAB: 1 mg | ORAL | Qty: 1

## 2014-12-08 MED FILL — NIMODIPINE 30 MG CAP: 30 mg | ORAL | Qty: 2

## 2014-12-08 MED FILL — D5-NS WITH POTASSIUM CHLORIDE 40 MEQ/L IV: 40 mEq/L | INTRAVENOUS | Qty: 1000

## 2014-12-08 NOTE — Other (Signed)
Patient Name Philo Sex DOB SSN Address Phone     Cella, Cappello [id number] Female Jan 10, 1956 [id number] [address]  [place] [address] [phone] (Home)        CSN:     [id number]        [place] Date: Admit Time Room Bed     Nov 28, 2014 2:25 AM 2604 [[id number]] [phone] [[id number]]        Attending Providers      Provider Pager From To     Lamonte Sakai, DO  11/28/14 11/28/14     Vira Blanco, MD  11/28/14         Emergency Contact(s)      Name Relation Home Work McCall R Sister [phone]         Utilization Review          [facility] [place] - Care Day 11 (12/08/2014) by Camelia Eng, RN      Review Entered Review Status     12/08/2014 Completed     Details          Care Day: 11 Care Date: 12/08/2014 Level of Care:      Guideline Day 2      Clinical Status     ( ) * No ICU or intermediate care needs          Interventions     (X) Inpatient interventions continue          12/08/2014 1:49 PM EDT by Janean Sark     Subject: Additional Clinical Information     T 101.5 P 130 R 31 BP 153/102 sat 100 RA    Agitated overnight. Patient again dislodged her PICC for the second time. Febrile. Lower extremities less rigid today    wbc 21.6 hgb 10.5 hct 31.9 glucose 111 calcium 7.7 mag 2.6 ALT 129 AST 135 alk phos 134 amylase 141 lipase 1121     ICU, regular 50gm NA diet, sodium q6hrs, continuous pulse oximetry, spot oximetry prn, PT/OT, sodium q6hrs, neurochecks hourly, VS continuous, DC Foley, Duonebs q8hrs, Hypertonic saline 3% IV, mag sulfate 2g IV q12hrs                         * Milestone

## 2014-12-08 NOTE — Progress Notes (Signed)
INFECTIOUS DISEASE FOLLOW UP NOTE :    Admit Date: 11/28/2014     Current  Prior    Off abx 6/7-2 Vanco/Meropenem 5/30-7     ASSESSMENT: -> RECS     Fever - since admission 5/30  - ?etiology: ?SIRS suspect from [facility], w/ elevated lipase 2008 ?pancreatitis, less likely drug fever  --picc and EVD out, 1 of mult bctx's bacillus sp, likely contam, serial csf cultures remain neg  -some UA pyuria but uctx 5/30  -CXR NAD, rare enterobacter in sputum   -ua 0-3 wbc 0.5% eos, significance unclear without rash or aki   -GI doubt clinical signifance of biochemical pancreatitis  -6/1 CTH sphenoid sinus AFL likely d/t intubation now extubated and sp 7 day bsabx  -pvl neg dvt LE 5/31 and 6/5, ANA positive  - EVD out 6/7, PICC dislodged 6/8, replaced LUE dislodged again -> continue to monitor for infectious etiol off abx - off cooling blanket today     ->reculture if septic appearing     biochem pancreatitis pancreas grossly nl on CT   -lipase little-changed today ->appreciate GI input   Mild leukocytosis??  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   H/o DVT on coumadin- currently on hold   - PVL's neg for acute thrombosis??   - Mx per others??   H/o hypothryoidism, history of thyroid surgery  -exophthalmos, free T4 nl, TSH low- ?on thryoid replacement PTA, ?? Euthyroid Graves  -ANA positive, ? 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 Awake, off cooling blanket, EVD, picc out (replaced again dislodged) denied chills, denied arthritis, denied cough sob or cp, denied diarrhea and GI indicates constipation.  D/w nurse at bedside, foley has been removed. Pt confirms h/o prior thyroid surgery for goiter, says on some med 2.5/5 daily, d/w her pos TSH R-AB suspect Graves.      Current Facility-Administered Medications   Medication Dose Route Frequency   ??? [START ON 12/09/2014] levothyroxine (SYNTHROID) tablet 50 mcg  50 mcg Oral ACB   ??? magnesium hydroxide (MILK OF  MAGNESIA) oral suspension 30 mL  30 mL Oral DAILY   ??? sodium chloride (OCEAN) 0.65 % nasal spray 2 Spray  2 Spray Both Nostrils Q12H   ??? pantoprazole (PROTONIX) tablet 40 mg  40 mg Oral Q12H    Or   ??? pantoprazole (PROTONIX) 40 mg in sodium chloride 0.9 % 10 mL injection  40 mg IntraVENous Q12H   ??? hypertonic saline 3 % (NEURO SCIENCE USE ONLY) infusion  0-1 mL/kg/hr IntraVENous TITRATE   ??? albuterol-ipratropium (DUO-NEB) 2.5 MG-0.5 MG/3 ML  3 mL Nebulization Q8H RT   ??? Thiamine Mononitrate (B-1) tablet 100 mg  100 mg Oral DAILY   ??? therapeutic multivitamin (THERAGRAN) tablet 1 Tab  1 Tab Oral DAILY   ??? folic acid (FOLVITE) tablet 1 mg  1 mg Oral DAILY   ??? acetaminophen (TYLENOL) tablet 650 mg  650 mg Oral Q4H PRN    ??? niMODipine (NIMOTOP) capsule 30 mg  30 mg Oral Q2H   ??? aspirin chewable tablet 81 mg  81 mg Oral DAILY    Or   ??? aspirin (ASA) suppository 300 mg  300 mg Rectal DAILY   ??? ELECTROLYTE REPLACEMENT PROTOCOL  1 Each Other Q8H   ??? ELECTROLYTE REPLACEMENT PROTOCOL  1 Each Other Q8H   ??? ELECTROLYTE REPLACEMENT PROTOCOL  1 Each Other Q8H   ??? PHARMACY INFORMATION NOTE  1 Each Other DAILY   ??? ondansetron (ZOFRAN) injection 1 mg  1 mg IntraVENous Q6H PRN   ??? acetaminophen (TYLENOL) suppository 650 mg  650 mg Rectal Q4H PRN   ??? magnesium sulfate 2 g/50 ml IVPB (premix or compounded)  2 g IntraVENous Q12H   ??? albuterol (PROVENTIL VENTOLIN) nebulizer solution 2.5 mg  2.5 mg Nebulization Q4H PRN   ??? sodium chloride (NS) flush 10 mL  10 mL InterCATHeter PRN   ??? sodium chloride (NS) flush 10-40 mL  10-40 mL InterCATHeter Q8H   ??? sodium chloride (NS) flush 10-30 mL  10-30 mL InterCATHeter PRN   ??? bacitracin 500 unit/gram packet 1 Packet  1 Packet Topical PRN          OBJECTIVE     BP 134/85 mmHg   Pulse 124   Temp(Src) 97 ??F (36.1 ??C)   Resp 25   Ht  (1.676 m)   Wt 100 kg (220 lb 7.4 oz)   BMI 35.60 kg/m2   SpO2 99%    Temp (24hrs), Avg:99.6 ??F (37.6 ??C), Min:97 ??F (36.1 ??C), Max:101.5 ??F (38.6 ??C)    GEN - pleasant BF awake and mildly confused in NAD in NICU  HEENT: R ventric out, no thrush.  OU exophthalmos  RESP- no crackles or wheezes  CVS-tachycardic S1'S2' no murmur appreciated  ABD- soft, NT, BS present  EXT- no edema , PIV RUE  SKIN - no rash   NEURO - awake, answers mostly appropriate, sl confused.      Labs: Results:   Chemistry Recent Labs      12/08/14   0315  12/07/14   2239  12/07/14   1530  12/07/14   0348   12/06/14   0405   GLU  111*   --    --   118*   --   130*   NA  142  143  140  139   < >  138   K  4.1   --   4.1  3.8   < >  3.7   CL  108   --    --   107   --   105   CO2  24   --    --   22   --   21   BUN  11   --    --   11   --   10   CREA  0.66   --    --   0.61   --   0.60    CA  7.7*   --    --   8.5   --   8.5   AGAP  10   --    --   10   --   12   BUCR  17   --    --   18   --   17   AP  134*   --    --   116   --   120*   TP  7.8   --    --   7.4   --   7.7   ALB  2.8*   --    --   2.7*   --   2.8*   GLOB  5.0*   --    --   4.7*   --   4.9*   AGRAT  0.6*   --    --   0.6*   --   0.6*    < > = values in this interval not displayed.      CBC w/Diff Recent Labs      12/08/14   0315  12/07/14   0348  12/06/14   0405   WBC  21.6*  20.5*  18.1*   RBC  3.67*  3.64*  3.84*   HGB  10.5*  10.5*  11.0*   HCT  31.9*  31.5*  33.3*   PLT  375  279  239        Thyrotropin Receptor Ab, serum 4.13 (H) 0.00 - 1.75  IU/L Final       cxr 6/8 IMPRESSION:  ??  CTH 6/8 1. Interval removal right frontal ventriculostomy catheter with no evidence of  new hydrocephalus.  ??  2. No new acute intracranial finding or significant interval change.  ??  3. Stable intraparenchymal hematoma and edema right superior frontal lobe, small  areas of hypodensity possible areas of infarct right cerebellum, stable small  intraventricular hemorrhage and bilateral subarachnoid hemorrhage.    Berenice Bouton, MD  December 08, 2014  [facility]  [phone]

## 2014-12-08 NOTE — Progress Notes (Signed)
Gastrointestinal Progress Note    Patient Name: Renee West    ZOXWR'U Date: 12/08/2014    Admit Date: 11/28/2014    Subjective:     Renee West is a 59 y.o. Female with subarachnoid hemorrhage.  She was noted to have elevated lipase and liver enzymes.  She had normal liver enzymes on admission.  She has had no abdominal pain or vomiting.  Labs have mild decrease with lipase to 1100 today.  CT and ultrasound noted no biliary abnormality and no gall stones.  Fatty liver noted.  No new problems.           Current Facility-Administered Medications   Medication Dose Route Frequency   ??? [START ON 12/09/2014] levothyroxine (SYNTHROID) tablet 50 mcg  50 mcg Oral ACB   ??? magnesium hydroxide (MILK OF MAGNESIA) oral suspension 30 mL  30 mL Oral DAILY   ??? sodium chloride (OCEAN) 0.65 % nasal spray 2 Spray  2 Spray Both Nostrils Q12H   ??? pantoprazole (PROTONIX) tablet 40 mg  40 mg Oral Q12H    Or   ??? pantoprazole (PROTONIX) 40 mg in sodium chloride 0.9 % 10 mL injection  40 mg IntraVENous Q12H   ??? hypertonic saline 3 % (NEURO SCIENCE USE ONLY) infusion  0-1 mL/kg/hr IntraVENous TITRATE   ??? albuterol-ipratropium (DUO-NEB) 2.5 MG-0.5 MG/3 ML  3 mL Nebulization Q8H RT   ??? Thiamine Mononitrate (B-1) tablet 100 mg  100 mg Oral DAILY   ??? therapeutic multivitamin (THERAGRAN) tablet 1 Tab  1 Tab Oral DAILY   ??? folic acid (FOLVITE) tablet 1 mg  1 mg Oral DAILY   ??? acetaminophen (TYLENOL) tablet 650 mg  650 mg Oral Q4H PRN   ??? niMODipine (NIMOTOP) capsule 30 mg  30 mg Oral Q2H   ??? aspirin chewable tablet 81 mg  81 mg Oral DAILY    Or   ??? aspirin (ASA) suppository 300 mg  300 mg Rectal DAILY   ??? ELECTROLYTE REPLACEMENT PROTOCOL  1 Each Other Q8H   ??? ELECTROLYTE REPLACEMENT PROTOCOL  1 Each Other Q8H   ??? ELECTROLYTE REPLACEMENT PROTOCOL  1 Each Other Q8H   ??? PHARMACY INFORMATION NOTE  1 Each Other DAILY   ??? ondansetron (ZOFRAN) injection 1 mg  1 mg IntraVENous Q6H PRN    ??? acetaminophen (TYLENOL) suppository 650 mg  650 mg Rectal Q4H PRN   ??? magnesium sulfate 2 g/50 ml IVPB (premix or compounded)  2 g IntraVENous Q12H   ??? albuterol (PROVENTIL VENTOLIN) nebulizer solution 2.5 mg  2.5 mg Nebulization Q4H PRN   ??? sodium chloride (NS) flush 10 mL  10 mL InterCATHeter PRN   ??? sodium chloride (NS) flush 10-40 mL  10-40 mL InterCATHeter Q8H   ??? sodium chloride (NS) flush 10-30 mL  10-30 mL InterCATHeter PRN   ??? bacitracin 500 unit/gram packet 1 Packet  1 Packet Topical PRN          Objective:     Physical Exam:    BP 138/81 mmHg   Pulse 113   Temp(Src) 97.6 ??F (36.4 ??C)   Resp 23   Ht  (1.676 m)   Wt 100 kg (220 lb 7.4 oz)   BMI 35.60 kg/m2   SpO2 100%  General appearance: alert, cooperative, no distress, appears stated age  Abdomen: soft, non-tender. Bowel sounds normal. No masses,  no organomegaly    Data Review:    Labs: Results:  Chemistry Recent Labs      12/08/14   0315  12/07/14   2239  12/07/14   1530  12/07/14   0348   12/06/14   0405   GLU  111*   --    --   118*   --   130*   NA  142  143  140  139   < >  138   K  4.1   --   4.1  3.8   < >  3.7   CL  108   --    --   107   --   105   CO2  24   --    --   22   --   21   BUN  11   --    --   11   --   10   CREA  0.66   --    --   0.61   --   0.60   CA  7.7*   --    --   8.5   --   8.5   AGAP  10   --    --   10   --   12   BUCR  17   --    --   18   --   17   AP  134*   --    --   116   --   120*   TP  7.8   --    --   7.4   --   7.7   ALB  2.8*   --    --   2.7*   --   2.8*   GLOB  5.0*   --    --   4.7*   --   4.9*   AGRAT  0.6*   --    --   0.6*   --   0.6*    < > = values in this interval not displayed.      CBC w/Diff Recent Labs      12/08/14   0315  12/07/14   0348  12/06/14   0405   WBC  21.6*  20.5*  18.1*   RBC  3.67*  3.64*  3.84*   HGB  10.5*  10.5*  11.0*   HCT  31.9*  31.5*  33.3*   PLT  375  279  239      Coagulation Recent Labs      12/08/14   0315  12/07/14   0348   PTP  14.0  14.1   INR  1.1  1.1    APTT  28.1  23.4*       Liver Enzymes Recent Labs      12/08/14   0315   TP  7.8   ALB  2.8*   AP  134*   SGOT  135*   ALT  129*          Assessment:   1. Abnormal lipase: She has elevated lipase but no pancreatitis on imaging and no symptoms to suggest ongoing pancreatitis.  No other source noted including no obvious medication as a source.  Will continue to follow lipase and observe clinically.   2. Abnormal liver enzymes: She has elevated liver enzymes in a hepatocellular pattern.  Normal liver enzymes on admission.  Most likely due to underlying fatty liver and due to there  other ongoing medical problems.  Will follow LFTS.  Will order hepatitis panel to be complete.     Recommendation:   1. Follow Lipase. Continue diet as tolerated.   2. Follow Liver enzymes.  Will order hepatitis panel.         Juliane Lack, MD  December 08, 2014

## 2014-12-08 NOTE — Progress Notes (Signed)
2000 Assumed care of the Pt, assessments completed and charted. Pt resting in the bed, restless and moving all over the bed, very difficult to get comfortable. Pt states that she doesn't know why she is restless.    0700 Pt continued to be restless throughout the evening, very little sleep. Pt does complain of some lower back/sacral discomfort but does not call it pain. Bedside and Verbal shift change report given to Fara Chute RN (oncoming nurse) by Talbert Nan, RN (offgoing nurse). Report included the following information SBAR, Kardex, Intake/Output, MAR, Recent Results and Cardiac Rhythm NSR.

## 2014-12-08 NOTE — Progress Notes (Signed)
Chart review for [place] PASRR [place] process    Karl Ito [facility] for [facility] of [place]  [phone]  [phone]

## 2014-12-08 NOTE — Progress Notes (Signed)
PASRR Number: [id number]. Notified A.Gearlean Alf [facility] for [employer] of [place]  [phone]  [phone]

## 2014-12-08 NOTE — Progress Notes (Signed)
Problem: Self Care Deficits Care Plan (Adult)  Goal: *Acute Goals and Plan of Care (Insert Text)  Occupational Therapy Goals  Initiated 11/30/2014 within 7 day(s), re-evaluated 12/07/14 goals updated.    While bed level:  1. Patient will perform grooming with supervision/set-up   2. Patient will perform upper body dressing with supervision/set-up.  3. Patient will participate in upper extremity therapeutic exercise/activities with supervision/set-up for 10 minutes.   4. Patient will utilize energy conservation techniques during functional activities with verbal cues.    Once bedrest orders removed:    5. Patient will perform lower body dressing with minimal assistance/contact guard assist.  6. Patient will perform toilet transfers with minimal assistance/contact guard assist.  7. Patient will perform all aspects of toileting with minimal assistance/contact guard assist.   Outcome: Progressing Towards Goal  OCCUPATIONAL THERAPY TREATMENT    Patient: Renee West (59 y.o. female)  Date: 12/08/2014  Diagnosis: SAH (subarachnoid hemorrhage) (HCC) SAH (subarachnoid hemorrhage) (HCC)       Precautions: Fall  Chart, occupational therapy assessment, plan of care, and goals were reviewed.      ASSESSMENT:  Pt now w/OOB orders, seen w/PT to maximize safety and activity tolerance. Pt impulsivity requires max verbal/tactile cues for safety w/EOB activity. Pt tolerates ~ 20 minutes sitting EOB performing ADL grooming tasks w/SBA. Pt stands for peri-care w/support of RW and side steps along EOB w/Min Assist. BP 168/111 at rest, 120/82 standing, 134/85 after activity HR 140s w/activity/standing  EDUCATION Pt educated on pacing ADLs and energy conservation techniques.  Progression toward goals:            Improving appropriately and progressing toward goals            Improving slowly and progressing toward goals            Not making progress toward goals and plan of care will be adjusted       PLAN:   Patient continues to benefit from skilled intervention to address the above impairments. Continue treatment per established plan of care.  Discharge Recommendations:  Inpatient Rehab  Further Equipment Recommendations for Discharge:   TBD by next level of care       SUBJECTIVE:   Patient stated ???I was suppose to go on a cruise.???      OBJECTIVE DATA SUMMARY:        Cognitive/Behavioral Status:  Neurologic State: Alert  Orientation Level: Oriented X4  Cognition: Follows commands  Safety/Judgement: Fall prevention  Functional Mobility and Transfers for ADLs:              Bed Mobility:  Rolling: Contact guard assistance  Supine to Sit: Contact guard assistance  Sit to Supine: Minimum assistance              Transfers:  Sit to Stand: Contact guard assistance  Balance:  Sitting: Intact  Sitting - Static: Good (unsupported)  Sitting - Dynamic: Good (unsupported)  Standing: Impaired;With support  Standing - Static: Fair  Standing - Dynamic : Fair  ADL Intervention:  Grooming  Washing Face: Stand-by assistance  Washing Hands: Stand-by assistance  Brushing Teeth: Stand-by assistance    Lower Body Dressing Assistance  Socks: Total assistance (dependent)    Pain:  Pre Treatment:0  Post Treatment:0  Pain Scale 1: Numeric (0 - 10)  Pain Intensity 1: 0    Activity Tolerance:    Good    Please refer to the flowsheet for vital signs taken during this  treatment.  After treatment:   [ ]   Patient left in no apparent distress sitting up in chair  [X]   Patient left in no apparent distress in bed  [ ]   Call bell left within reach  [X]   Nursing notified  [ ]   Caregiver present  [ ]   Bed alarm activated    Leann  Dextradeur, COTA  Time Calculation: 40 mins

## 2014-12-08 NOTE — Progress Notes (Addendum)
Neurovascular Progress Note      Patient: Renee West MRN: [id number]  SSN: [id number]    Date of Birth: 11/06/1955  Age: 59 y.o.  Sex: female        Event:  Agitated overnight. Patient again dislodged her PICC for the second time.     Febrile.     Lower extremities less rigid today.    Vital Signs  Patient Vitals for the past 24 hrs:   Temp Pulse Resp BP SpO2   12/08/14 0800 98.3 ??F (36.8 ??C) (!) 115 28 (!) 173/117 mmHg 100 %   12/08/14 0700 99.9 ??F (37.7 ??C) (!) 119 23 (!) 152/102 mmHg 99 %   12/08/14 0600 (!) 100.9 ??F (38.3 ??C) (!) 133 26 (!) 171/100 mmHg 99 %   12/08/14 0500 (!) 101.5 ??F (38.6 ??C) (!) 130 27 (!) 159/101 mmHg 99 %   12/08/14 0400 100.3 ??F (37.9 ??C) (!) 122 (!) 31 (!) 154/95 mmHg 95 %   12/08/14 0300 99.2 ??F (37.3 ??C) (!) 131 26 113/88 mmHg 95 %   12/08/14 0200 100.2 ??F (37.9 ??C) (!) 117 27 (!) 174/96 mmHg 95 %   12/08/14 0100 100.3 ??F (37.9 ??C) (!) 119 30 (!) 180/117 mmHg 97 %   12/08/14 0000 99.9 ??F (37.7 ??C) (!) 110 26 (!) 146/127 mmHg 97 %   12/07/14 2300 98.8 ??F (37.1 ??C) (!) 111 28 (!) 169/98 mmHg 96 %   12/07/14 2200 99.2 ??F (37.3 ??C) (!) 107 25 166/86 mmHg 96 %   12/07/14 2107 98.2 ??F (36.8 ??C) - - - -   12/07/14 2106 98.1 ??F (36.7 ??C) - - - -   12/07/14 2100 100.3 ??F (37.9 ??C) (!) 115 22 177/85 mmHg 98 %   12/07/14 2000 (!) 100.7 ??F (38.2 ??C) (!) 123 30 (!) 176/113 mmHg 93 %   12/07/14 1900 100.2 ??F (37.9 ??C) (!) 119 30 158/69 mmHg 98 %   12/07/14 1800 100 ??F (37.8 ??C) (!) 127 26 (!) 155/99 mmHg 99 %   12/07/14 1730 - (!) 123 22 - 94 %   12/07/14 1700 100 ??F (37.8 ??C) (!) 116 29 180/55 mmHg -   12/07/14 1600 98.6 ??F (37 ??C) - - - -   12/07/14 1524 - - - - 96 %   12/07/14 1500 (!) 100.8 ??F (38.2 ??C) (!) 117 28 (!) 179/104 mmHg -   12/07/14 1400 100 ??F (37.8 ??C) (!) 119 21 175/87 mmHg -   12/07/14 1300 99.2 ??F (37.3 ??C) (!) 110 (!) 31 (!) 143/103 mmHg -   12/07/14 1200 97.5 ??F (36.4 ??C) - - - -   12/07/14 1100 99.4 ??F (37.4 ??C) - - - -    12/07/14 1000 100 ??F (37.8 ??C) (!) 105 (!) 33 (!) 164/91 mmHg -       NIHSS Flow Sheet  Total: 0 (12/08/14 0800)     Intake and Output    Intake/Output Summary (Last 24 hours) at 12/08/14 0944  Last data filed at 12/08/14 0800   Gross per 24 hour   Intake 3074.5 ml   Output   2925 ml   Net  149.5 ml     Data    Recent Results (from the past 24 hour(s))   GLUCOSE, POC    Collection Time: 12/07/14 11:59 AM   Result Value Ref Range    Glucose (POC) 133 (H) 70 - 110 mg/dL   OCCULT BLOOD, STOOL    Collection Time:  12/07/14  1:34 PM   Result Value Ref Range    Occult blood, stool NEGATIVE  NEG     SODIUM    Collection Time: 12/07/14  3:30 PM   Result Value Ref Range    Sodium 140 136 - 145 mmol/L   POTASSIUM    Collection Time: 12/07/14  3:30 PM   Result Value Ref Range    Potassium 4.1 3.5 - 5.5 mmol/L   PHOSPHORUS    Collection Time: 12/07/14  3:30 PM   Result Value Ref Range    Phosphorus 2.0 (L) 2.5 - 4.9 MG/DL   GLUCOSE, POC    Collection Time: 12/07/14  5:08 PM   Result Value Ref Range    Glucose (POC) 133 (H) 70 - 110 mg/dL   TYPE & SCREEN    Collection Time: 12/07/14 10:39 PM   Result Value Ref Range    Crossmatch Expiration 12/10/2014     ABO/Rh(D) AB POSITIVE     Antibody screen NEG    SODIUM    Collection Time: 12/07/14 10:39 PM   Result Value Ref Range    Sodium 143 136 - 145 mmol/L   CBC W/O DIFF    Collection Time: 12/08/14  3:15 AM   Result Value Ref Range    WBC 21.6 (H) 4.6 - 13.2 K/uL    RBC 3.67 (L) 4.20 - 5.30 M/uL    HGB 10.5 (L) 12.0 - 16.0 g/dL    HCT 31.9 (L) 35.0 - 45.0 %    MCV 86.9 74.0 - 97.0 FL    MCH 28.6 24.0 - 34.0 PG    MCHC 32.9 31.0 - 37.0 g/dL    RDW 15.5 (H) 11.6 - 14.5 %    PLATELET 375 135 - 420 K/uL    MPV 10.8 9.2 - 11.8 FL   PROTHROMBIN TIME + INR    Collection Time: 12/08/14  3:15 AM   Result Value Ref Range    Prothrombin time 14.0 11.5 - 15.2 sec    INR 1.1 0.8 - 1.2     PTT    Collection Time: 12/08/14  3:15 AM   Result Value Ref Range    aPTT 28.1 24.6 - 32.9 SEC    METABOLIC PANEL, BASIC    Collection Time: 12/08/14  3:15 AM   Result Value Ref Range    Sodium 142 136 - 145 mmol/L    Potassium 4.1 3.5 - 5.5 mmol/L    Chloride 108 100 - 108 mmol/L    CO2 24 21 - 32 mmol/L    Anion gap 10 3.0 - 18 mmol/L    Glucose 111 (H) 74 - 99 mg/dL    BUN 11 7.0 - 18 MG/DL    Creatinine 0.66 0.6 - 1.3 MG/DL    BUN/Creatinine ratio 17 12 - 20      GFR est AA >60 >60 ml/min/1.90m    GFR est non-AA >60 >60 ml/min/1.720m   Calcium 7.7 (L) 8.5 - 10.1 MG/DL   CALCIUM, IONIZED    Collection Time: 12/08/14  3:15 AM   Result Value Ref Range    Ionized Calcium 1.10 (L) 1.12 - 1.32 MMOL/L   MAGNESIUM    Collection Time: 12/08/14  3:15 AM   Result Value Ref Range    Magnesium 2.6 (H) 1.8 - 2.4 mg/dL   PHOSPHORUS    Collection Time: 12/08/14  3:15 AM   Result Value Ref Range    Phosphorus 2.9 2.5 - 4.9  MG/DL   AMMONIA    Collection Time: 12/08/14  3:15 AM   Result Value Ref Range    Ammonia 19 11 - 32 UMOL/L   AMYLASE    Collection Time: 12/08/14  3:15 AM   Result Value Ref Range    Amylase 141 (H) 25 - 115 U/L   LIPASE    Collection Time: 12/08/14  3:15 AM   Result Value Ref Range    Lipase 1121 (H) 73 - 393 U/L   HEPATIC FUNCTION PANEL    Collection Time: 12/08/14  3:15 AM   Result Value Ref Range    Protein, total 7.8 6.4 - 8.2 g/dL    Albumin 2.8 (L) 3.4 - 5.0 g/dL    Globulin 5.0 (H) 2.0 - 4.0 g/dL    A-G Ratio 0.6 (L) 0.8 - 1.7      Bilirubin, total 0.8 0.2 - 1.0 MG/DL    Bilirubin, direct 0.4 (H) 0.0 - 0.2 MG/DL    Alk. phosphatase 134 (H) 45 - 117 U/L    AST 135 (H) 15 - 37 U/L    ALT 129 (H) 13 - 56 U/L   TSH 3RD GENERATION    Collection Time: 12/08/14  3:15 AM   Result Value Ref Range    TSH 0.49 0.36 - 3.74 uIU/mL   T4, FREE    Collection Time: 12/08/14  3:15 AM   Result Value Ref Range    T4, Free 1.0 0.7 - 1.5 NG/DL   T3, FREE    Collection Time: 12/08/14  3:15 AM   Result Value Ref Range    Triiodothyronine (T3), free 2.4 2.3 - 4.2 PG/ML   GLUCOSE, POC     Collection Time: 12/08/14  7:49 AM   Result Value Ref Range    Glucose (POC) 107 70 - 110 mg/dL      Physical Exam  General: Supine, HOB; 65 degrees.  Neuro: Patient eyelids open. Bilateral proptosis. Pupils 2 and reactive. She is able to count fingers in all four visual fields. She lifts all extremities off bed spontaneously. Lower extremities less rigid today.  Wound: Suture at old EVD entrance site.   Pulmonary: Decrease breath sounds throughout.  CV: S1 and S2 heard.  Abd: Obese, NT, ND, NG.   Extremities: Distal pulses 1+.    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     1.) Neurologic: Patient more agitated at night time it seems. Multifocal moderate vasospasm improved after the intra-arterial administration of vasodilator treated on Monday. EVD removed the following day. SBP autoregulated. Let see how patient does without being on hypertonic saline.     Aspirin to decrease risk of thromboemboli from devices used to sacrifice LVA.  Nimodipine for 21 days; dose split every 2 hours. Maintin euvolemia. Watch for cerebral salt wasting.    Patient up ad lib today. If changes occur in neuro place patient back in bed. If patient tolerating increase activity level hopefully patient may be transferred to acute rehab tomorrow or Saturday.    2.) Cardiovascular: SBP autoregulate.    3.) Pulmonary: Fair oxygenation on nasal cannula.  Decrease neb frequency and as needed.    4.) Endocrine: Hypothyroidism therapy held as initial TFT suggested hyperthyroidism. Repeat TFT in normal limits without therapy. Restarted patient on low dose levothyroxine. Recheck TFT levels at 1 month.  5.) Hematology: Hgb stable. LE [place] with no DVT noted.  SCDs. No pharmacologic DVT prophylaxis or treatment for now.      6.) Fluids, Electrolytes, and Nutrition: Patient restarted on PO intake.  Patient heplock. Vitamins given.     7.) Gastrointestinal: GI input appreciated for increase in lipase and amylase.  Amylase and Lipase remain elevated but are trending downward. RUQ [place] and CT unrevealing. PPI switched to intravenous for + hemoccult. Most recent hemoccult negative.  Minimize other medications that might be problematic as indicated. Bowel regimen.     8.) LTD: D/C foley. PIV.     9.) ID: Febrile with increase in WBC count. Input from ID appreciated. Not on antibiotic therapy. Stopped after 7 days. Monitor while off. Antipyretics and external cooling systems to maintain euthermia. Repeat studies if temp > 100.5 every 48-72 hours.    Total time spent on neurocritical care was 25 minutes.     Creta Levin, NP

## 2014-12-08 NOTE — Progress Notes (Signed)
Spoke to [place] with admission at [facility] and rehab in [place] , [place].  She stated she has submitted therapy notes for pre certification to the patients insurance carrier.  She will need OOB note from physical therapy note.  Jasmine December will need to submit this information to [facility] before they will begin authorization it is  at this point [facility] will evaluate for admission. Jasmine December stated she would not be able to obtain authorization by  Friday and they do not admit over the weekends. Jasmine December sated they can accept the patient on Monday December 12, 2014 if [facility] authorizes admission.  I have also call transportation to cost out the price of transport. [facility] has quoted me a price $ 2010.76 for one way.  There is a base price of $ 222.48 and then $ 7.24 per mile.  The patient [employer] covers all but 30%. Medical transport stated that the patient will be billed and she did not need to have the 30% upfront. Lurena Nida, RN

## 2014-12-08 NOTE — Other (Signed)
0700: Bedside shift change report given to Dorie Rank, RN (oncoming nurse) with Lanae Boast, RN as preceptor by Eduard Clos (offgoing nurse). Report included the following information SBAR, Kardex, Intake/Output, MAR and Recent Results.     0800: IVPB Mag held d/t am. lab 2.6    Na 142, unable to start hypertonic d/t PICC line pulled out overnight. Informed Marigene Ehlers, NP.

## 2014-12-08 NOTE — Progress Notes (Signed)
Problem: Mobility Impaired (Adult and Pediatric)  Goal: *Acute Goals and Plan of Care (Insert Text)  PHYSICAL THERAPY SHORT TERM GOALS : while on bedrest   1. Patient will perform/completeroll to both sides with supervision/set-up within 1 week(s)12-14-14  2. Patient will perform/completescoot up in bed with supervision/set-up within 1 week(s).12-14-14  3. Patient will participate in lower extremity therapeutic exercise/activities with supervision/set-up for 10 minutes within 1 week(s)12-14-14  PHYSICAL THERAPY SHORT TERM GOALS :when off bedrest   1. Patient will perform/complete supine to sitting with minimal assistance/contact guard assist within 1 week(s6-15-16  2. Patient will perform/complete sit to stand with moderate assistance within 1 week(s).12-14-14  3. Patient will perform/complete bed to chair with moderate assistance within 1 week(s).12-14-14  4. Patient will perform/completeambulate for 25 feet and least restrictive device with moderate assistance within 1 week(s).12-14-14  5. Patient will participate in lower extremity therapeutic exercise/activities with modified independence for 15 minutes within 1 week(s).12-14-14  Therapist Burnett Harry, PT 12/07/2014  Time Calculation: 13 mins  STG for 5.31.16 to be completed by 6.7.16 (7 days).  1. Pt will complete supine therex PROM/AAROM to [facility] (B) in order to decrease risk of contractures and maintain current strength.   Outcome: Progressing Towards Goal  PHYSICAL THERAPY TREATMENT    Patient: Renee West (59 y.o. female)  Date: 12/08/2014  Diagnosis: SAH (subarachnoid hemorrhage) (HCC) SAH (subarachnoid hemorrhage) (HCC)  Precautions: Fall   Chart, physical therapy assessment, plan of care and goals were reviewed.      ASSESSMENT:  Co-treated with OT for safety due to pt is now off bed rest.  SBP 168 upon beginning session.  Elevated HOB slowly in increments to prevent orthostatic hypotension.  Pt sat at EOB without assistance and use of bed  rail.  Pt with good sitting balance without UE support.  C/o buttock pain in sitting, pt very squirmy at EOB and unable to obtain seated BP.  No difficulty with sit to stand, knees buckling mildly in standing.  SBP 120 in standing.  Pt performed side steps up to [facility], sliding R foot, picking L foot up.  Pt sat EOB and performed ADLs with OT.  Assisted back to supine after sitting up for 20 minutes.  Supine SBP 134.  Deferred leaving in a chair due to impulsivity and fatigue.  Notified nurse.    Education:moving slowly to prevent dizziness  Progression toward goals:        Improving appropriately and progressing toward goals        Improving slowly and progressing toward goals        Not making progress toward goals and plan of care will be adjusted       PLAN:  Patient continues to benefit from skilled intervention to address the above impairments. Continue treatment per established plan of care.  Discharge Recommendations:  Inpatient Acute Rehab vs Skilled Nursing Facility, TBD  Further Equipment Recommendations for Discharge:  rolling walker, TBD       SUBJECTIVE:   Patient stated ???I was going on a cruise when this happened.???      OBJECTIVE DATA SUMMARY:   Critical Behavior:  Neurologic State: Alert  Orientation Level: Oriented X4  Cognition: Follows commands  Safety/Judgement: Fall prevention  Functional Mobility Training:  Bed Mobility:  Rolling: Contact guard assistance  Supine to Sit: Contact guard assistance  Sit to Supine: Minimum assistance  Transfers:  Sit to Stand: Contact guard assistance  Stand to Sit: Contact guard assistance  Balance:  Sitting: Intact  Sitting - Static: Good (unsupported)  Sitting - Dynamic: Good (unsupported)  Standing: Impaired;With support  Standing - Static: Fair  Standing - Dynamic : Fair  Ambulation/Gait Training:  Distance (ft): 2 Feet (ft)  Assistive Device: Gait belt;Walker, rolling  Ambulation - Level of Assistance: Minimal assistance   Gait Abnormalities: Decreased step clearance  Pain:  Pre 0  Post 0  Pain Scale 1: Numeric (0 - 10)  Activity Tolerance:   Good.  Falling asleep upon ending session.  Please refer to the flowsheet for vital signs taken during this treatment.  After treatment:   [ ]  Patient left in no apparent distress sitting up in chair  [X]  Patient left in no apparent distress in bed  [X]  Call bell left within reach  [X]  Nursing notified  [ ]  Caregiver present  [ ]  Bed alarm activated      Doreen Beam, PTA   Time Calculation: 40 mins

## 2014-12-09 LAB — METABOLIC PANEL, BASIC
Anion gap: 10 mmol/L (ref 3.0–18)
BUN/Creatinine ratio: 16 (ref 12–20)
BUN: 11 MG/DL (ref 7.0–18)
CO2: 24 mmol/L (ref 21–32)
Calcium: 8.4 MG/DL — ABNORMAL LOW (ref 8.5–10.1)
Chloride: 102 mmol/L (ref 100–108)
Creatinine: 0.67 MG/DL (ref 0.6–1.3)
GFR est AA: >60 mL/min/{1.73_m2} (ref >60)
GFR est non-AA: >60 mL/min/{1.73_m2} (ref >60)
Glucose: 97 mg/dL (ref 74–99)
Potassium: 3.9 mmol/L (ref 3.5–5.5)
Sodium: 136 mmol/L (ref 136–145)

## 2014-12-09 LAB — HEPATITIS PANEL, ACUTE
Hep B surface Ag Interp.: NEGATIVE
Hep C virus Ab Interp.: NEGATIVE
Hepatitis A, IgM: NEGATIVE
Hepatitis B core, IgM: NEGATIVE
Hepatitis B surface Ag: <0.1 Index (ref <1.00)
Hepatitis C virus Ab: 0.42 Index (ref <0.80)

## 2014-12-09 LAB — HEPATIC FUNCTION PANEL
A-G Ratio: 0.6 — ABNORMAL LOW (ref 0.8–1.7)
ALT (SGPT): 112 U/L — ABNORMAL HIGH (ref 13–56)
AST (SGOT): 76 U/L — ABNORMAL HIGH (ref 15–37)
Albumin: 2.8 g/dL — ABNORMAL LOW (ref 3.4–5.0)
Alk. phosphatase: 129 U/L — ABNORMAL HIGH (ref 45–117)
Bilirubin, direct: 0.4 MG/DL — ABNORMAL HIGH (ref 0.0–0.2)
Bilirubin, total: 0.8 MG/DL (ref 0.2–1.0)
Globulin: 4.8 g/dL — ABNORMAL HIGH (ref 2.0–4.0)
Protein, total: 7.6 g/dL (ref 6.4–8.2)

## 2014-12-09 LAB — AMYLASE: Amylase: 122 U/L — ABNORMAL HIGH (ref 25–115)

## 2014-12-09 LAB — PTT: aPTT: 26.9 s (ref 23.0–36.4)

## 2014-12-09 LAB — CULTURE, BLOOD
Culture result:: NO GROWTH
Culture result:: NO GROWTH

## 2014-12-09 LAB — AMMONIA: Ammonia, plasma: 46 umol/L — ABNORMAL HIGH (ref 11–32)

## 2014-12-09 LAB — CULTURE, CSF W GRAM STAIN
Culture result:: NO GROWTH
GRAM STAIN: NONE SEEN

## 2014-12-09 LAB — CBC W/O DIFF
HCT: 29.9 % — ABNORMAL LOW (ref 35.0–45.0)
HGB: 10 g/dL — ABNORMAL LOW (ref 12.0–16.0)
MCH: 29.1 PG (ref 24.0–34.0)
MCHC: 33.4 g/dL (ref 31.0–37.0)
MCV: 86.9 FL (ref 74.0–97.0)
MPV: 10.5 FL (ref 9.2–11.8)
PLATELET: 410 10*3/uL (ref 135–420)
RBC: 3.44 M/uL — ABNORMAL LOW (ref 4.20–5.30)
RDW: 15.7 % — ABNORMAL HIGH (ref 11.6–14.5)
WBC: 20.5 10*3/uL — ABNORMAL HIGH (ref 4.6–13.2)

## 2014-12-09 LAB — LIPASE: Lipase: 973 U/L — ABNORMAL HIGH (ref 73–393)

## 2014-12-09 LAB — CALCIUM, IONIZED: Ionized Calcium: 1.1 MMOL/L — ABNORMAL LOW (ref 1.12–1.32)

## 2014-12-09 LAB — PROTHROMBIN TIME + INR
INR: 1.1 (ref 0.8–1.2)
Prothrombin time: 13.6 s (ref 11.5–15.2)

## 2014-12-09 LAB — POTASSIUM: Potassium: 4.2 mmol/L (ref 3.5–5.5)

## 2014-12-09 LAB — MAGNESIUM: Magnesium: 2.1 mg/dL (ref 1.8–2.4)

## 2014-12-09 LAB — OCCULT BLOOD, STOOL: Occult blood, stool: POSITIVE — AB

## 2014-12-09 LAB — GLUCOSE, POC: Glucose (POC): 112 mg/dL — ABNORMAL HIGH (ref 70–110)

## 2014-12-09 LAB — PHOSPHORUS: Phosphorus: 3.1 MG/DL (ref 2.5–4.9)

## 2014-12-09 MED ORDER — POTASSIUM CHLORIDE SR 10 MEQ TAB, PARTICLES/CRYSTALS
10 mEq | Freq: Once | ORAL | Status: AC
Start: 2014-12-09 — End: 2014-12-09
  Administered 2014-12-09: 13:00:00 via ORAL

## 2014-12-09 MED FILL — THIAMINE MONONITRATE 100 MG TABLET: 100 mg | ORAL | Qty: 1

## 2014-12-09 MED FILL — NIMODIPINE 30 MG CAP: 30 mg | ORAL | Qty: 1

## 2014-12-09 MED FILL — MILK OF MAGNESIA 400 MG/5 ML ORAL SUSPENSION: 400 mg/5 mL | ORAL | Qty: 30

## 2014-12-09 MED FILL — THERA 400 MCG TABLET: 400 mcg | ORAL | Qty: 1

## 2014-12-09 MED FILL — SYNTHROID 25 MCG TABLET: 25 mcg | ORAL | Qty: 2

## 2014-12-09 MED FILL — TYLENOL 325 MG TABLET: 325 mg | ORAL | Qty: 2

## 2014-12-09 MED FILL — NIMODIPINE 30 MG CAP: 30 mg | ORAL | Qty: 2

## 2014-12-09 MED FILL — PANTOPRAZOLE 40 MG TAB, DELAYED RELEASE: 40 mg | ORAL | Qty: 1

## 2014-12-09 MED FILL — MAGNESIUM SULFATE 2 GRAM/50 ML IVPB: 2 gram/50 mL (4 %) | INTRAVENOUS | Qty: 50

## 2014-12-09 MED FILL — ASPIRIN 81 MG CHEWABLE TAB: 81 mg | ORAL | Qty: 1

## 2014-12-09 MED FILL — POTASSIUM CHLORIDE SR 10 MEQ TAB, PARTICLES/CRYSTALS: 10 mEq | ORAL | Qty: 1

## 2014-12-09 MED FILL — FOLIC ACID 1 MG TAB: 1 mg | ORAL | Qty: 1

## 2014-12-09 NOTE — Other (Signed)
Bedside and Verbal shift change report given to [place] Benzeneb RN by Robert Koon RN. Report included the following information SBAR, Kardex, Intake/Output and MAR.

## 2014-12-09 NOTE — Progress Notes (Signed)
1150  Pt arrived on floor from ICU.  Pt AOx4.  Pt oriented to room, pt verbalizes understanding.  Call light in reach, bed in low position.    1408  Pt up in chair.  Pt removed clothing.  Pt redressed and reoriented to room. Tabs bed alarm set.     1648  Pt reoriented to room; call light in reach

## 2014-12-09 NOTE — Progress Notes (Signed)
Problem: Self Care Deficits Care Plan (Adult)  Goal: *Acute Goals and Plan of Care (Insert Text)  Occupational Therapy Goals  Initiated 11/30/2014 within 7 day(s), re-evaluated 12/07/14 goals updated.    While bed level:  1. Patient will perform grooming with supervision/set-up   2. Patient will perform upper body dressing with supervision/set-up.  3. Patient will participate in upper extremity therapeutic exercise/activities with supervision/set-up for 10 minutes.   4. Patient will utilize energy conservation techniques during functional activities with verbal cues.    Once bedrest orders removed:    5. Patient will perform lower body dressing with minimal assistance/contact guard assist.  6. Patient will perform toilet transfers with minimal assistance/contact guard assist.  7. Patient will perform all aspects of toileting with minimal assistance/contact guard assist.   Outcome: Progressing Towards Goal  OCCUPATIONAL THERAPY TREATMENT    Patient: Renee West (59 y.o. female)  Date: 12/09/2014  Diagnosis: SAH (subarachnoid hemorrhage) (HCC) SAH (subarachnoid hemorrhage) (HCC)       Precautions: Fall  Chart, occupational therapy assessment, plan of care, and goals were reviewed.      ASSESSMENT:  Pt requesting use bathroom upon entry. Pt cont w/impulsivity and requires mod verbal/tactile cues for safety w/functional transfer to [facility] w/RW and Min Assist. Pt decrease standing balance requires Max Assist w/toileting hygiene and clothing mgt. Pt returns to supine w/CGA. Pt cont w/tachycardia 140s w/standing activity. BP 174/88 after activity  EDUCATION Pt educated on importance OOB for increase activity tolerance w/ADLs  Progression toward goals:            Improving appropriately and progressing toward goals            Improving slowly and progressing toward goals            Not making progress toward goals and plan of care will be adjusted       PLAN:   Patient continues to benefit from skilled intervention to address the above impairments. Continue treatment per established plan of care.  Discharge Recommendations:  Inpatient Rehab vs Skilled Nursing Facility  Further Equipment Recommendations for Discharge:   TBD by next level of care       SUBJECTIVE:   Patient stated ???I'm going to a rehab center on my same street.???      OBJECTIVE DATA SUMMARY:        Cognitive/Behavioral Status:  Neurologic State: Alert  Orientation Level: Oriented X4  Cognition: Follows commands  Safety/Judgement: Fall prevention  Functional Mobility and Transfers for ADLs:              Bed Mobility:  Supine to Sit: Minimum assistance  Sit to Supine: Contact guard assistance              Transfers:  Sit to Stand: Minimum assistance              Toilet Transfer : Minimum assistance (EOB <--> BSC xfer w/RW)              Balance:  Sitting: Intact  Sitting - Static: Good (unsupported)  Sitting - Dynamic: Fair (occasional)  Standing: Impaired;With support  Standing - Static: Fair  Standing - Dynamic : Fair  ADL Intervention:  Grooming  Washing Face: Stand-by assistance  Washing Hands: Stand-by assistance    Toileting  Toileting Assistance: Maximum assistance    Pain:  Pre Treatment:0  Post Treatment:0    Activity Tolerance:    Fair, pt fatigues easily    Please refer  to the flowsheet for vital signs taken during this treatment.  After treatment:   [ ]   Patient left in no apparent distress sitting up in chair  [X]   Patient left in no apparent distress in bed  [X]   Call bell left within reach  [X]   Nursing notified  [ ]   Caregiver present  [ ]   Bed alarm activated    Leann  Dextradeur, COTA  Time Calculation: 25 mins

## 2014-12-09 NOTE — Progress Notes (Signed)
Problem: Mobility Impaired (Adult and Pediatric)  Goal: *Acute Goals and Plan of Care (Insert Text)  PHYSICAL THERAPY SHORT TERM GOALS : while on bedrest   1. Patient will perform/completeroll to both sides with supervision/set-up within 1 week(s)12-14-14  2. Patient will perform/completescoot up in bed with supervision/set-up within 1 week(s).12-14-14  3. Patient will participate in lower extremity therapeutic exercise/activities with supervision/set-up for 10 minutes within 1 week(s)12-14-14  PHYSICAL THERAPY SHORT TERM GOALS :when off bedrest   1. Patient will perform/complete supine to sitting with minimal assistance/contact guard assist within 1 week(s6-15-16  2. Patient will perform/complete sit to stand with moderate assistance within 1 week(s).12-14-14  3. Patient will perform/complete bed to chair with moderate assistance within 1 week(s).12-14-14  4. Patient will perform/completeambulate for 25 feet and least restrictive device with moderate assistance within 1 week(s).12-14-14  5. Patient will participate in lower extremity therapeutic exercise/activities with modified independence for 15 minutes within 1 week(s).12-14-14  Therapist Burnett Harry, PT 12/07/2014  Time Calculation: 13 mins  STG for 5.31.16 to be completed by 6.7.16 (7 days).  1. Pt will complete supine therex PROM/AAROM to [facility] (B) in order to decrease risk of contractures and maintain current strength.   Outcome: Progressing Towards Goal  PHYSICAL THERAPY TREATMENT    Patient: Renee West (59 y.o. female)  Date: 12/09/2014  Diagnosis: SAH (subarachnoid hemorrhage) (HCC) SAH (subarachnoid hemorrhage) (HCC)  Precautions: Fall   Chart, physical therapy assessment, plan of care and goals were reviewed.      ASSESSMENT:  Pt with gown half off in bed upon entering room.  Pt performed log roll to the R with minA and VC, modA to sit up.  Pt c/o buttock pain when sitting up.  Pt stood and stepped over to chair.  Placed pillow on seat and pt  left sitting in recliner comfortable, 2 visitors arrived.  Notified nurse.    Education:Instructed pt to call for assistance and not to get up without staff.  Progression toward goals:        Improving appropriately and progressing toward goals        Improving slowly and progressing toward goals        Not making progress toward goals and plan of care will be adjusted       PLAN:  Patient continues to benefit from skilled intervention to address the above impairments. Continue treatment per established plan of care.  Discharge Recommendations:  Skilled Nursing Facility  Further Equipment Recommendations for Discharge:  rolling walker possibly       SUBJECTIVE:   Patient stated ???I was hot.???      OBJECTIVE DATA SUMMARY:   Critical Behavior:  Neurologic State: Alert  Orientation Level: Oriented X4  Cognition: Follows commands  Safety/Judgement: Fall prevention  Functional Mobility Training:  Bed Mobility:    Supine to Sit: Minimum assistance  Sit to Supine: Contact guard assistance  Scooting: Minimum assistance  Transfers:  Sit to Stand: Contact guard assistance  Stand to Sit: Contact guard assistance  Bed to Chair: Contact guard assistance  Balance:  Sitting: Intact  Sitting - Static: Good (unsupported)  Sitting - Dynamic: Fair (occasional)  Standing: Intact;With support  Standing - Static: Fair  Standing - Dynamic : Fair  Ambulation/Gait Training:  Distance (ft): 3 Feet (ft)  Assistive Device: Gait belt;Walker, rolling  Ambulation - Level of Assistance: Contact guard assistance  Pain:  Pre 0  Post 10 sitting EOB  Pain Scale 1: Numeric (0 -  10)  Activity Tolerance:   Fair  Please refer to the flowsheet for vital signs taken during this treatment.  After treatment:   [X]  Patient left in no apparent distress sitting up in chair  [ ]  Patient left in no apparent distress in bed  [X]  Call bell left within reach  [X]  Nursing notified  [ ]  Caregiver present  [ ]  Bed alarm activated      Doreen Beam, PTA    Time Calculation: 17 mins

## 2014-12-09 NOTE — Progress Notes (Signed)
2992- assumed care of patient.  0800- assessment complete, NIH at 0. Pt alert x4. No distress noted or pain stated. See doc flow sheets for complete assessment  1050- report called to 2W RN

## 2014-12-09 NOTE — Progress Notes (Signed)
I spoke to Carolan Clines the [employer], [phone], she stated the patient was ok for admission to [facility] in [place], [place].  I spoke to [place],  [phone] or [phone],  admission director with [facility] and she stated she will be ready to accept this patient Monday.  Lurena Nida, RN

## 2014-12-09 NOTE — Progress Notes (Signed)
Problem: Falls - Risk of  Goal: *Absence of falls  Outcome: Progressing Towards Goal  Pt calls for help appropriately.  Tabs unit on for periodic confusion    Problem: Pain  Goal: *Control of Pain  Outcome: Progressing Towards Goal  Pt denies pain    Problem: Discharge Planning  Goal: *Discharge to safe environment  Outcome: Progressing Towards Goal  Pt to discharge on Monday

## 2014-12-09 NOTE — Other (Signed)
TRANSFER - IN REPORT:    Verbal report received from Montez Hageman RN on ARBELLA KROLIK  being received from PACU for routine post - op      Report consisted of patient???s Situation, Background, Assessment and   Recommendations(SBAR).     Information from the following report(s) SBAR, Kardex, Intake/Output and MAR was reviewed with the receiving nurse.    Opportunity for questions and clarification was provided.      Assessment completed upon patient???s arrival to unit and care assumed.

## 2014-12-09 NOTE — Progress Notes (Signed)
Gastrointestinal Progress Note    Patient Name: Renee West    SMOLM'B Date: 12/09/2014    Admit Date: 11/28/2014    Subjective:     Renee West is a 59 y.o. Female with subarachnoid hemorrhage.  She was noted to have elevated lipase and liver enzymes.  She had normal liver enzymes on admission.  She has had no abdominal pain or vomiting.  CT and ultrasound noted no biliary abnormality and no gall stones.  Fatty liver noted.  Liver enzymes continue to improve with AST 76 ALT 112.  Lipase continues to improve to 973.  Hepatitis panel pending. No new problems.           Current Facility-Administered Medications   Medication Dose Route Frequency   ??? levothyroxine (SYNTHROID) tablet 50 mcg  50 mcg Oral ACB   ??? magnesium hydroxide (MILK OF MAGNESIA) oral suspension 30 mL  30 mL Oral DAILY   ??? sodium chloride (OCEAN) 0.65 % nasal spray 2 Spray  2 Spray Both Nostrils Q12H   ??? pantoprazole (PROTONIX) tablet 40 mg  40 mg Oral Q12H    Or   ??? pantoprazole (PROTONIX) 40 mg in sodium chloride 0.9 % 10 mL injection  40 mg IntraVENous Q12H   ??? Thiamine Mononitrate (B-1) tablet 100 mg  100 mg Oral DAILY   ??? therapeutic multivitamin (THERAGRAN) tablet 1 Tab  1 Tab Oral DAILY   ??? folic acid (FOLVITE) tablet 1 mg  1 mg Oral DAILY   ??? acetaminophen (TYLENOL) tablet 650 mg  650 mg Oral Q4H PRN   ??? niMODipine (NIMOTOP) capsule 30 mg  30 mg Oral Q2H   ??? aspirin chewable tablet 81 mg  81 mg Oral DAILY    Or   ??? aspirin (ASA) suppository 300 mg  300 mg Rectal DAILY   ??? ELECTROLYTE REPLACEMENT PROTOCOL  1 Each Other Q8H   ??? ELECTROLYTE REPLACEMENT PROTOCOL  1 Each Other Q8H   ??? ELECTROLYTE REPLACEMENT PROTOCOL  1 Each Other Q8H   ??? PHARMACY INFORMATION NOTE  1 Each Other DAILY   ??? ondansetron (ZOFRAN) injection 1 mg  1 mg IntraVENous Q6H PRN   ??? acetaminophen (TYLENOL) suppository 650 mg  650 mg Rectal Q4H PRN   ??? magnesium sulfate 2 g/50 ml IVPB (premix or compounded)  2 g IntraVENous Q12H    ??? albuterol (PROVENTIL VENTOLIN) nebulizer solution 2.5 mg  2.5 mg Nebulization Q4H PRN   ??? sodium chloride (NS) flush 10 mL  10 mL InterCATHeter PRN   ??? sodium chloride (NS) flush 10-40 mL  10-40 mL InterCATHeter Q8H   ??? sodium chloride (NS) flush 10-30 mL  10-30 mL InterCATHeter PRN   ??? bacitracin 500 unit/gram packet 1 Packet  1 Packet Topical PRN          Objective:     Physical Exam:    BP 174/88 mmHg   Pulse 132   Temp(Src) 97.9 ??F (36.6 ??C)   Resp 20   Ht 5\' 6"  (1.676 m)   Wt 100 kg (220 lb 7.4 oz)   BMI 35.60 kg/m2   SpO2 100%  General appearance: alert, cooperative, no distress, appears stated age  Abdomen: soft, non-tender. Bowel sounds normal. No masses,  no organomegaly, no rebound or guarding, no ascites    Data Review:    Labs: Results:       Chemistry Recent Labs      12/09/14   0446  12/08/14   0315  12/07/14   2239  12/07/14   1530  12/07/14   0348   GLU  97  111*   --    --   118*   NA  136  142  143  140  139   K  3.9  4.1   --   4.1  3.8   CL  102  108   --    --   107   CO2  24  24   --    --   22   BUN  11  11   --    --   11   CREA  0.67  0.66   --    --   0.61   CA  8.4*  7.7*   --    --   8.5   AGAP  10  10   --    --   10   BUCR  16  17   --    --   18   AP  129*  134*   --    --   116   TP  7.6  7.8   --    --   7.4   ALB  2.8*  2.8*   --    --   2.7*   GLOB  4.8*  5.0*   --    --   4.7*   AGRAT  0.6*  0.6*   --    --   0.6*      CBC w/Diff Recent Labs      12/09/14   0446  12/08/14   0315  12/07/14   0348   WBC  20.5*  21.6*  20.5*   RBC  3.44*  3.67*  3.64*   HGB  10.0*  10.5*  10.5*   HCT  29.9*  31.9*  31.5*   PLT  410  375  279      Coagulation Recent Labs      12/09/14   0446  12/08/14   0315   PTP  13.6  14.0   INR  1.1  1.1   APTT  26.9  28.1       Liver Enzymes Recent Labs      12/09/14   0446   TP  7.6   ALB  2.8*   AP  129*   SGOT  76*   ALT  112*          Assessment:   1. Abnormal lipase: She has elevated lipase but no pancreatitis on imaging  and no symptoms to suggest ongoing pancreatitis.  No other source noted including no obvious medication as a source.  Lipase improving nicely with her improved clinical status.  Will continue to follow lipase and observe clinically.   2. Abnormal liver enzymes: She has elevated liver enzymes in a hepatocellular pattern.  Normal liver enzymes on admission.  Most likely due to underlying fatty liver and due to her other ongoing medical problems.  Liver enzymes improving with her overall clinical status. Will follow LFTS.  Hepatitis panel pending.     Recommendation:   1. Follow Lipase. Continue diet as tolerated.   2. Follow Liver enzymes.  Await hepatitis panel.         Juliane Lack, MD  December 09, 2014

## 2014-12-09 NOTE — Progress Notes (Addendum)
1930- Pt received in bed awake. She is alert and oriented x 4 with periods of confusion. No s/s of distress noted. No complaints voiced at this time. Tabs unit in place for fall prevention. Bed locked in low position. Call bell in reach.    1945- Noted telemetry and NIH orders. Orders initiated. Pt placed on telemetry box 56.    2315- Incontinence care provided. Pt bathed and repositioned. Pt tolerated well. Resting quietly in bed.     0130- Pt has removed gown and telemetry box. Both replaced. Pt repositioned. Offered bedpan. 400cc yellow urine.     36- Given bedpan. 300cc yellow urine.     0510- Pt awake in bed. Has taken off gown, removed TABS unit and IV site. Pt states, "I didn't do it. It was ya'll." Attempt to restart IV, unsuccessful. Call to nsg supervisor for assistance.     2595- Return call from nsg supervisor, Kriste Basque. States to give pt's arms a rest and reattempt IV.

## 2014-12-09 NOTE — Progress Notes (Addendum)
Medical transportation has been arranged for 10 am pickup, she will travel BLS with Medical Transport to [facility] in [place], [place]. She will need a discharge summary prior to her discharge and any prescription for Narcotics will need to be faxed to the facility prior to her departure. I have spoke to the patients sister who is the spokes person for the family and she is aware of the discharge arrangenments.  The patient has agreed to these arrangement. PSAR has been completed and has been entered into E discharge and faxed to the facility. Lurena Nida, RN

## 2014-12-09 NOTE — Progress Notes (Signed)
Neurovascular Progress Note      Patient: Renee West MRN: [id number]  SSN: [id number]    Date of Birth: 04-30-1956  Age: 59 y.o.  Sex: female        Event:  Tachycardiac.    Afebrile.    Transfer to neuro/stroke.     Vital Signs  Patient Vitals for the past 24 hrs:   Temp Pulse Resp BP SpO2   12/09/14 1000 - (!) 132 20 174/88 mmHg 100 %   12/09/14 0800 97.9 ??F (36.6 ??C) (!) 115 26 (!) 173/97 mmHg 100 %   12/09/14 0700 - (!) 104 (!) 33 - -   12/09/14 0600 - (!) 113 27 (!) 158/101 mmHg -   12/09/14 0500 - (!) 110 29 - 100 %   12/09/14 0400 98.3 ??F (36.8 ??C) (!) 109 19 149/88 mmHg 100 %   12/09/14 0300 - (!) 117 23 - 100 %   12/09/14 0200 - (!) 104 (!) 35 (!) 134/97 mmHg 100 %   12/09/14 0100 - (!) 114 23 - 100 %   12/09/14 0000 98.4 ??F (36.9 ??C) (!) 111 22 (!) 150/91 mmHg 100 %   12/08/14 2300 - (!) 118 24 - 100 %   12/08/14 2200 - (!) 114 20 (!) 146/106 mmHg 100 %   12/08/14 2100 - (!) 120 19 - 100 %   12/08/14 2000 99.2 ??F (37.3 ??C) (!) 118 24 (!) 158/99 mmHg 100 %   12/08/14 1900 - (!) 117 28 151/79 mmHg 100 %   12/08/14 1800 - (!) 124 27 (!) 174/106 mmHg 98 %   12/08/14 1700 - (!) 124 24 (!) 180/98 mmHg 100 %   12/08/14 1600 98.1 ??F (36.7 ??C) (!) 112 25 (!) 174/98 mmHg 99 %   12/08/14 1500 98.4 ??F (36.9 ??C) (!) 118 27 (!) 156/95 mmHg (!) 10 %   12/08/14 1400 98.2 ??F (36.8 ??C) (!) 113 23 138/81 mmHg 100 %   12/08/14 1300 97.6 ??F (36.4 ??C) (!) 117 26 (!) 137/108 mmHg 97 %   12/08/14 1230 - - - 134/85 mmHg -   12/08/14 1200 97 ??F (36.1 ??C) (!) 124 25 134/85 mmHg 99 %   12/08/14 1100 98.6 ??F (37 ??C) (!) 119 30 158/76 mmHg -       NIHSS Flow Sheet  Total: 0 (12/09/14 1000)     Intake and Output    Intake/Output Summary (Last 24 hours) at 12/09/14 1043  Last data filed at 12/09/14 1000   Gross per 24 hour   Intake   2510 ml   Output   1125 ml   Net   1385 ml     Data    Recent Results (from the past 24 hour(s))   OCCULT BLOOD, STOOL    Collection Time: 12/08/14  4:08 PM   Result Value Ref Range     Occult blood, stool POSITIVE (A) NEG     AMMONIA    Collection Time: 12/09/14  4:40 AM   Result Value Ref Range    Ammonia 46 (H) 11 - 32 UMOL/L   HEPATITIS PANEL, ACUTE    Collection Time: 12/09/14  4:40 AM   Result Value Ref Range    Hepatitis A, IgM PENDING     __        ---------------------------------------------------    Hepatitis B surface Ag <0.10 <1.00 Index    Hep B surface Ag Interp. NEGATIVE  NEG  __        ---------------------------------------------------    Hepatitis B core, IgM PENDING     __        ---------------------------------------------------    Hepatitis C virus Ab PENDING Index    Hep C  virus Ab Interp. PENDING     Hep C  virus Ab comment         CBC W/O DIFF    Collection Time: 12/09/14  4:46 AM   Result Value Ref Range    WBC 20.5 (H) 4.6 - 13.2 K/uL    RBC 3.44 (L) 4.20 - 5.30 M/uL    HGB 10.0 (L) 12.0 - 16.0 g/dL    HCT 29.9 (L) 35.0 - 45.0 %    MCV 86.9 74.0 - 97.0 FL    MCH 29.1 24.0 - 34.0 PG    MCHC 33.4 31.0 - 37.0 g/dL    RDW 15.7 (H) 11.6 - 14.5 %    PLATELET 410 135 - 420 K/uL    MPV 10.5 9.2 - 11.8 FL   PROTHROMBIN TIME + INR    Collection Time: 12/09/14  4:46 AM   Result Value Ref Range    Prothrombin time 13.6 11.5 - 15.2 sec    INR 1.1 0.8 - 1.2     PTT    Collection Time: 12/09/14  4:46 AM   Result Value Ref Range    aPTT 26.9 23.0 - 95.2 SEC   METABOLIC PANEL, BASIC    Collection Time: 12/09/14  4:46 AM   Result Value Ref Range    Sodium 136 136 - 145 mmol/L    Potassium 3.9 3.5 - 5.5 mmol/L    Chloride 102 100 - 108 mmol/L    CO2 24 21 - 32 mmol/L    Anion gap 10 3.0 - 18 mmol/L    Glucose 97 74 - 99 mg/dL    BUN 11 7.0 - 18 MG/DL    Creatinine 0.67 0.6 - 1.3 MG/DL    BUN/Creatinine ratio 16 12 - 20      GFR est AA >60 >60 ml/min/1.25m    GFR est non-AA >60 >60 ml/min/1.752m   Calcium 8.4 (L) 8.5 - 10.1 MG/DL   CALCIUM, IONIZED    Collection Time: 12/09/14  4:46 AM   Result Value Ref Range    Ionized Calcium 1.10 (L) 1.12 - 1.32 MMOL/L   MAGNESIUM     Collection Time: 12/09/14  4:46 AM   Result Value Ref Range    Magnesium 2.1 1.8 - 2.4 mg/dL   PHOSPHORUS    Collection Time: 12/09/14  4:46 AM   Result Value Ref Range    Phosphorus 3.1 2.5 - 4.9 MG/DL   AMYLASE    Collection Time: 12/09/14  4:46 AM   Result Value Ref Range    Amylase 122 (H) 25 - 115 U/L   LIPASE    Collection Time: 12/09/14  4:46 AM   Result Value Ref Range    Lipase 973 (H) 73 - 393 U/L   HEPATIC FUNCTION PANEL    Collection Time: 12/09/14  4:46 AM   Result Value Ref Range    Protein, total 7.6 6.4 - 8.2 g/dL    Albumin 2.8 (L) 3.4 - 5.0 g/dL    Globulin 4.8 (H) 2.0 - 4.0 g/dL    A-G Ratio 0.6 (L) 0.8 - 1.7      Bilirubin, total 0.8 0.2 - 1.0 MG/DL    Bilirubin, direct 0.4 (H)  0.0 - 0.2 MG/DL    Alk. phosphatase 129 (H) 45 - 117 U/L    AST 76 (H) 15 - 37 U/L    ALT 112 (H) 13 - 56 U/L   GLUCOSE, POC    Collection Time: 12/09/14  6:03 AM   Result Value Ref Range    Glucose (POC) 112 (H) 70 - 110 mg/dL      Physical Exam  General: Supine, HOB: 65 degrees.  Neuro: Patient eyelids open. Bilateral proptosis. Pupils 2 and reactive. She is able to count fingers in all four visual fields. She lifts all extremities off bed spontaneously.   Wound: Suture at old EVD entrance site.   Pulmonary: Decrease breath sounds throughout.  CV: S1 and S2 heard.  Abd: Obese, NT, ND, NG.   Extremities: Distal pulses 1+.    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     1.) Neurologic: Patient less agitated today. SBP autoregulated. Aspirin to decrease risk of thromboemboli from devices used to sacrifice LVA.  Nimodipine for 21 days; dose split every 2 hours. Maintin euvolemia.     Up ad lib today. Waiting on authorization for patient to go to rehab in [place]. Patient to be transferred to neuro/stroke floor.     2.) Cardiovascular: SBP autoregulate.     3.) Pulmonary: Fair oxygenation on nasal cannula.  Decrease neb frequency and as needed.    4.) Endocrine: Hypothyroidism therapy held as initial TFT suggested hyperthyroidism. Repeat TFT in normal limits without therapy. Restarted patient on low dose levothyroxine. Recheck TFT levels at 1 month.     5.) Hematology: Hgb stable. LE [place] with no DVT noted.  SCDs. No pharmacologic DVT prophylaxis or treatment for now.      6.) Fluids, Electrolytes, and Nutrition: Tolerating PO intake. Patient heplock. Vitamins given.     7.) Gastrointestinal: GI input appreciated for increase in lipase and amylase.  Amylase and Lipase remain elevated but are trending downward. RUQ [place] and CT unrevealing. PPI switched to intravenous for + hemoccult. Most recent hemoccult negative.  Minimize other medications that might be problematic as indicated. Bowel regimen.     8.) LTD: PIV.     9.) ID: Afebrile with increase in WBC count. Input from ID appreciated. Not on antibiotic therapy. Stopped after 7 days. Monitor while off. Antipyretic usage to keep patient euthermic. Repeat studies if temp > 100.5 every 48-72 hours.    Total time spent on neuro care was 25 minutes.     Creta Levin, NP

## 2014-12-09 NOTE — Progress Notes (Signed)
INFECTIOUS DISEASE FOLLOW UP NOTE :    Admit Date: 11/28/2014     N.B.  We will plan to be available over weekend and to f/u Monday, 6/13    ->please call Dr. Brayton Mars (on-call) at [phone] if any problem or question for ID prior.    Current  Prior    Off abx 6/7-3 Vanco/Meropenem 5/30-7     ASSESSMENT: -> RECS     Fever - since admission 5/30, improving  - ?etiology: ?SIRS suspect from [facility], w/ elevated lipase 2008 ?pancreatitis, less likely drug fever  --picc and EVD out, 1 of mult bctx's bacillus sp, likely contam, serial csf cultures remain neg  -some UA pyuria but uctx 5/30  -CXR NAD, rare enterobacter in sputum   -ua 0-3 wbc 0.5% eos, significance unclear without rash or aki   -GI doubt clinical signifance of biochemical pancreatitis  -6/1 CTH sphenoid sinus AFL likely d/t intubation now extubated and sp 7 day bsabx  -pvl neg dvt LE 5/31 and 6/5, ANA positive  - EVD out 6/7, PICC dislodged 6/8, replaced LUE dislodged again -> continue to monitor for infectious etiol off abx - T and  wbc sl lower, remains tachycardic  ->reculture if septic appearing     Leukocytosis- sl better 20.5K today  ->monitor with others off abx    biochem pancreatitis pancreas grossly nl on CT   -lipase lower 970 today ->appreciate GI input   Mild leukocytosis??  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   H/o DVT on coumadin- currently on hold  - PVL's neg for acute thrombosis??   - Mx per others??   H/o hypothryoidism, history of thyroid surgery  -exophthalmos, free T4 nl, TSH low  -d/w Dr. Excell Seltzer, understand on thyroid replacement PTA  -Pos TSH receptor AB - c/w Graves, residual Ophthalmopathy  -ANA positive, ? Underlying autoimmune disorder ->need for thryoid replacement deferred to others  ->await ANA titer/reflex  ->per others      MICROBIOLOGY:   CSF 5/31, 6/2-5 ng, 6/6 ngtd  5/30  ucx ng   rpr nr  5/31  Blcx x2 ng           Ucx neg           resp cx rare Enterobacter, CF and C albicans  6/3 bctx 1 of 2 bacillus sp    uctx ngtd  6/4 bctx x 2 ng    LINES AND CATHETERS:   PIV    SUBJECTIVE :     Interval notes reviewed. Transferred out of NICU this am, thyroid bloodwork d/w Dr. Excell Seltzer yesterday, understand on replacement PTA.  Pt denied fever chill sob cough n/v/d or abd pain.  D/w her off abx and recent  cultures neg.       Current Facility-Administered Medications   Medication Dose Route Frequency   ??? levothyroxine (SYNTHROID) tablet 50 mcg  50 mcg Oral ACB   ??? magnesium hydroxide (MILK OF MAGNESIA) oral suspension 30 mL  30 mL Oral DAILY   ??? sodium chloride (OCEAN) 0.65 % nasal spray 2 Spray  2 Spray Both Nostrils Q12H   ??? pantoprazole (PROTONIX) tablet 40 mg  40 mg Oral Q12H    Or   ??? pantoprazole (PROTONIX) 40 mg in sodium chloride 0.9 % 10 mL injection  40 mg IntraVENous Q12H   ??? Thiamine Mononitrate (B-1) tablet 100 mg  100 mg Oral DAILY   ??? therapeutic multivitamin (THERAGRAN) tablet 1 Tab  1 Tab Oral DAILY   ??? folic acid (FOLVITE) tablet 1 mg  1 mg Oral DAILY   ??? acetaminophen (TYLENOL) tablet 650 mg  650 mg Oral Q4H PRN   ??? niMODipine (NIMOTOP) capsule 30 mg  30 mg Oral Q2H   ??? aspirin chewable tablet 81 mg  81 mg Oral DAILY    Or    ??? aspirin (ASA) suppository 300 mg  300 mg Rectal DAILY   ??? ELECTROLYTE REPLACEMENT PROTOCOL  1 Each Other Q8H   ??? ELECTROLYTE REPLACEMENT PROTOCOL  1 Each Other Q8H   ??? ELECTROLYTE REPLACEMENT PROTOCOL  1 Each Other Q8H   ??? PHARMACY INFORMATION NOTE  1 Each Other DAILY   ??? ondansetron (ZOFRAN) injection 1 mg  1 mg IntraVENous Q6H PRN   ??? acetaminophen (TYLENOL) suppository 650 mg  650 mg Rectal Q4H PRN   ??? magnesium sulfate 2 g/50 ml IVPB (premix or compounded)  2 g IntraVENous Q12H   ??? albuterol (PROVENTIL VENTOLIN) nebulizer solution 2.5 mg  2.5 mg Nebulization Q4H PRN   ??? sodium chloride (NS) flush 10 mL  10 mL InterCATHeter PRN   ??? sodium chloride (NS) flush 10-40 mL  10-40 mL InterCATHeter Q8H   ??? sodium chloride (NS) flush 10-30 mL  10-30 mL InterCATHeter PRN   ??? bacitracin 500 unit/gram packet 1 Packet  1 Packet Topical PRN          OBJECTIVE     BP 174/88 mmHg   Pulse 132   Temp(Src) 97.9 ??F (36.6 ??C)   Resp 20   Ht  (1.676 m)   Wt 100 kg (220 lb 7.4 oz)   BMI 35.60 kg/m2   SpO2 100%    Temp (24hrs), Avg:98.1 ??F (36.7 ??C), Min:97 ??F (36.1 ??C), Max:99.2 ??F (37.3 ??C)    GEN - pleasant BF out of icu  HEENT: R ventric site closed dry, no thrush.  OU exophthalmos  RESP- no crackles or wheezes  CVS-tachycardic S1'S2' no murmur appreciated  ABD- soft, NT, BS present  EXT- no edema , PIV RUE  SKIN - no rash   NEURO - awake, answers mostly appropriate      Labs: Results:   Chemistry Recent Labs      12/09/14   0446  12/08/14   0315  12/07/14   2239  12/07/14   1530  12/07/14   0348   GLU  97  111*   --    --   118*   NA  136  142  143  140  139   K  3.9  4.1   --   4.1  3.8   CL  102  108   --    --   107  CO2  24  24   --    --   22   BUN  11  11   --    --   11   CREA  0.67  0.66   --    --   0.61   CA  8.4*  7.7*   --    --   8.5   AGAP  10  10   --    --   10   BUCR  16  17   --    --   18   AP  129*  134*   --    --   116 TP  7.6  7.8   --    --   7.4   ALB  2.8*  2.8*   --    --   2.7*    GLOB  4.8*  5.0*   --    --   4.7*   AGRAT  0.6*  0.6*   --    --   0.6*      CBC w/Diff Recent Labs      12/09/14   0446  12/08/14   0315  12/07/14   0348   WBC  20.5*  21.6*  20.5*   RBC  3.44*  3.67*  3.64*   HGB  10.0*  10.5*  10.5*   HCT  29.9*  31.9*  31.5*   PLT  410  375  279        Thyrotropin Receptor Ab, serum 4.13 (H) 0.00 - 1.75  IU/L Final       cxr 6/8 IMPRESSION:  ??  CTH 6/8 1. Interval removal right frontal ventriculostomy catheter with no evidence of  new hydrocephalus.  ??  2. No new acute intracranial finding or significant interval change.  ??  3. Stable intraparenchymal hematoma and edema right superior frontal lobe, small  areas of hypodensity possible areas of infarct right cerebellum, stable small  intraventricular hemorrhage and bilateral subarachnoid hemorrhage.    Berenice Bouton, MD  December 09, 2014  [facility]  [phone]

## 2014-12-10 LAB — METABOLIC PANEL, BASIC
Anion gap: 11 mmol/L (ref 3.0–18)
BUN/Creatinine ratio: 18 (ref 12–20)
BUN: 10 MG/DL (ref 7.0–18)
CO2: 23 mmol/L (ref 21–32)
Calcium: 8 MG/DL — ABNORMAL LOW (ref 8.5–10.1)
Chloride: 103 mmol/L (ref 100–108)
Creatinine: 0.55 MG/DL — ABNORMAL LOW (ref 0.6–1.3)
GFR est AA: >60 mL/min/{1.73_m2} (ref >60)
GFR est non-AA: >60 mL/min/{1.73_m2} (ref >60)
Glucose: 113 mg/dL — ABNORMAL HIGH (ref 74–99)
Potassium: 3.9 mmol/L (ref 3.5–5.5)
Sodium: 137 mmol/L (ref 136–145)

## 2014-12-10 LAB — CULTURE, CSF W GRAM STAIN
Culture result:: NO GROWTH
GRAM STAIN: NONE SEEN

## 2014-12-10 LAB — CARDIAC PANEL,(CK, CKMB & TROPONIN)
CK - MB: 2.9 ng/ml (ref 0.5–3.6)
CK-MB Index: 2.5 % (ref 0.0–4.0)
CK: 118 U/L (ref 26–192)
Troponin-I, QT: <0.02 NG/ML (ref 0.0–0.045)

## 2014-12-10 LAB — AMYLASE: Amylase: 97 U/L (ref 25–115)

## 2014-12-10 LAB — AMMONIA: Ammonia, plasma: 31 umol/L (ref 11–32)

## 2014-12-10 LAB — LIPASE: Lipase: 792 U/L — ABNORMAL HIGH (ref 73–393)

## 2014-12-10 MED FILL — MAGNESIUM SULFATE 2 GRAM/50 ML IVPB: 2 gram/50 mL (4 %) | INTRAVENOUS | Qty: 50

## 2014-12-10 MED FILL — NIMODIPINE 30 MG CAP: 30 mg | ORAL | Qty: 1

## 2014-12-10 MED FILL — ASPIRIN 81 MG CHEWABLE TAB: 81 mg | ORAL | Qty: 1

## 2014-12-10 MED FILL — MILK OF MAGNESIA 400 MG/5 ML ORAL SUSPENSION: 400 mg/5 mL | ORAL | Qty: 30

## 2014-12-10 MED FILL — SYNTHROID 50 MCG TABLET: 50 mcg | ORAL | Qty: 1

## 2014-12-10 MED FILL — TYLENOL 325 MG TABLET: 325 mg | ORAL | Qty: 2

## 2014-12-10 MED FILL — THERA 400 MCG TABLET: 400 mcg | ORAL | Qty: 1

## 2014-12-10 MED FILL — THIAMINE MONONITRATE 100 MG TABLET: 100 mg | ORAL | Qty: 1

## 2014-12-10 MED FILL — FOLIC ACID 1 MG TAB: 1 mg | ORAL | Qty: 1

## 2014-12-10 MED FILL — PANTOPRAZOLE 40 MG TAB, DELAYED RELEASE: 40 mg | ORAL | Qty: 1

## 2014-12-10 NOTE — Progress Notes (Addendum)
Assumed pt care; pt resting in bed; no distress noted; Dual NIH with off going nurse; Incontinence care completed; Bed in low position; Side rails up x 3; Personal belonging within reach.Will continue to monitor.    12/11/14    0010: Dr Excell Seltzer at bedside. Inspected Rash, to be seen by ID Monday.    0700: Bedside and Verbal shift change report given to April (oncoming nurse) by Rudean Haskell, RN   (offgoing nurse). Report included the following information SBAR, Kardex, MAR and Recent Results.

## 2014-12-10 NOTE — Other (Signed)
Bedside shift change report given to April Elam, RN (oncoming nurse) by Ponshewaing A Benzineb, RN   (offgoing nurse). Report included the following information SBAR and Kardex.

## 2014-12-10 NOTE — Progress Notes (Signed)
Neurovascular Progress Note      Patient: Renee West MRN: [id number]  SSN: [id number]    Date of Birth: [birth year]  Age: 59 y.o.  Sex: female        Event:  Tachycardia increases with fever with slight decrease in BP.        Vital Signs  Patient Vitals for the past 24 hrs:   Temp Pulse Resp BP SpO2   12/11/14 0000 97.2 ??F (36.2 ??C) (!) 110 20 128/65 mmHg 100 %   12/10/14 2306 - (!) 112 - 126/67 mmHg -   12/10/14 2100 - (!) 106 - 123/78 mmHg -   12/10/14 2000 98.3 ??F (36.8 ??C) (!) 107 20 116/79 mmHg 96 %   12/10/14 1823 - (!) 118 - 116/69 mmHg -   12/10/14 1628 98.4 ??F (36.9 ??C) (!) 126 20 134/87 mmHg 97 %   12/10/14 1500 - (!) 124 - 108/79 mmHg -   12/10/14 1315 100.1 ??F (37.8 ??C) (!) 120 20 122/86 mmHg 97 %   12/10/14 1300 - (!) 120 - 122/86 mmHg -   12/10/14 1100 - (!) 133 - 109/80 mmHg -   12/10/14 1018 - (!) 123 - 103/78 mmHg -   12/10/14 0915 - 98 - (!) 166/98 mmHg -   12/10/14 0740 99 ??F (37.2 ??C) 98 18 164/90 mmHg 98 %   12/10/14 0424 98.7 ??F (37.1 ??C) (!) 114 20 (!) 169/100 mmHg -       NIHSS Flow Sheet  Total: 0 (12/11/14 0000)     Intake and Output    Intake/Output Summary (Last 24 hours) at 12/11/14 0140  Last data filed at 12/11/14 0110   Gross per 24 hour   Intake    450 ml   Output    800 ml   Net   -350 ml     Data    Recent Results (from the past 24 hour(s))   AMMONIA    Collection Time: 12/10/14  5:25 AM   Result Value Ref Range    Ammonia 31 11 - 32 UMOL/L   AMYLASE    Collection Time: 12/10/14  5:25 AM   Result Value Ref Range    Amylase 97 25 - 115 U/L   LIPASE    Collection Time: 12/10/14  5:25 AM   Result Value Ref Range    Lipase 792 (H) 73 - 393 U/L   METABOLIC PANEL, BASIC    Collection Time: 12/10/14  5:25 AM   Result Value Ref Range    Sodium 137 136 - 145 mmol/L    Potassium 3.9 3.5 - 5.5 mmol/L    Chloride 103 100 - 108 mmol/L    CO2 23 21 - 32 mmol/L    Anion gap 11 3.0 - 18 mmol/L    Glucose 113 (H) 74 - 99 mg/dL    BUN 10 7.0 - 18 MG/DL    Creatinine 7.82 (L) 0.6 - 1.3 MG/DL     BUN/Creatinine ratio 18 12 - 20      GFR est AA >60 >60 ml/min/1.8m2    GFR est non-AA >60 >60 ml/min/1.66m2    Calcium 8.0 (L) 8.5 - 10.1 MG/DL   EKG, 12 LEAD, SUBSEQUENT    Collection Time: 12/10/14 12:09 PM   Result Value Ref Range    Ventricular Rate 131 BPM    Atrial Rate 131 BPM    P-R Interval 122 ms    QRS Duration 76 ms  Q-T Interval 326 ms    QTC Calculation (Bezet) 481 ms    Calculated P Axis 8 degrees    Calculated R Axis 24 degrees    Calculated T Axis 43 degrees    Diagnosis       Sinus tachycardia  Minimal voltage criteria for LVH, may be normal variant  Borderline ECG  When compared with ECG of 06-Dec-2014 06:56,  No significant change was found     CARDIAC PANEL,(CK, CKMB & TROPONIN)    Collection Time: 12/10/14  2:22 PM   Result Value Ref Range    CK 118 26 - 192 U/L    CK - MB 2.9 0.5 - 3.6 ng/ml    CK-MB Index 2.5 0.0 - 4.0 %    Troponin-I, Qt. <0.02 0.0 - 0.045 NG/ML      Physical Exam  Supine, HOB low.   Alert.  Jovial and deflects questions about details.  VFFTC. Full versions. Tongue midline. Left arm 4+/5 others 5/5. LT symmetric. Proptosis.  CV regular, tachycardic. Abs soft, NT. No edema.    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     1.) Neurologic:  Continues to do well compared to less than 2 weeks ago when in coma.  Preparing for rehab on Monday.  Allow SBP to autoregulate as cardiopulmonary status tolerates.  Aspirin continues.  Continue nimodipine for 21 days.  No AED.    Follow up cerebral angiography in 6 months.  CT for changes.    2.) Cardiovascular: Tachycardic continues with increases during mildly febrile periods.  Start low dose beta blocker if SBP will tolerate.    3.) Pulmonary:  Good oxygenation on air.  Saline spray for air sinuses.      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 Repeat TFT in normal limits without therapy. Restarted patient on low dose levothyroxine. Recheck TFT levels at 1 month.     5.) Hematology: Hgb stable. LE [place] with no DVT noted.  SCDs.  Can start LMWH DVT prophylaxis.    6.) Fluids, Electrolytes, and Nutrition: Tolerating PO intake. Patient heplock. Vitamins given.     7.) Gastrointestinal: GI input appreciated for increase in lipase and amylase which are trending down.  Suspect this may have been from iron sucrose administration as well a stools becoming heme positive. Nonetheless continue PPI for now.  Minimize other medications that might be problematic as indicated. Bowel regimen.     8.) LTD: PIV.     9.) ID: Afebrile with increase in WBC count. Input from ID appreciated. Not on antibiotic therapy. Stopped after 7 days. Monitor while off. Antipyretic usage to keep patient euthermic. Repeat studies if temp > 100.5 every 48-72 hours.    15 minutes of neurologic care provided.    Andrey Cota, MD

## 2014-12-10 NOTE — Progress Notes (Addendum)
Received care of pt from [place]. Pt restless and talking nonsensical when report being giving, but when assessment questions asked pt answers appropriately .    0930 pt inc then voided 150 on bedpan. oob with assist of 1 and walker to recliner. Had co of itchy rash and burning on buttock, when I went to cleanse with wipe and apply lotion noted pt has several (6) clusters of small vesicular rash(?shingles like  from top of buttock crease  Wrapping around rt buttock to hip contact precautions.     1010 notified by tele hr elevated to 130. Aware pt had just been active and had just finished eating. Pulse 120-133. bp 103/78.    1030 pt asleep but arouses easily bp 101/76/ hr remains unchanged.paged  C Baker NP  1110 pt still asleep temp 100.9 pulse goes as high as 140. bp 109/80. Pulse 135. Dr Excell Seltzer aware and orders recieved    1210 Ekg completed . Pt getting drowsier. Will return to bed after eating lunch.    1330 in bed .

## 2014-12-10 NOTE — Progress Notes (Signed)
Renee West is a 59 y.o. Female with subarachnoid hemorrhage.?? She was noted to have elevated lipase and liver enzymes.?? She had normal liver enzymes on admission.?? She has had no abdominal pain or vomiting.?? CT and ultrasound noted no biliary abnormality and no gall stones.?? Fatty liver noted.?? Liver enzymes continue to improve with AST 76 ALT 112.?? Lipase continues to improve to 973.?? Hepatitis panel pending. No new problems.     Plan:1. Follow Lipase. Continue diet as tolerated. ??  2. Follow Liver enzymes.?? Await hepatitis panel.

## 2014-12-10 NOTE — Progress Notes (Signed)
Bedside shift change report given to Temi (oncoming nurse) by April (offgoing nurse). Report included the following information SBAR, Kardex, Intake/Output, MAR, Recent Results, Med Rec Status and Cardiac Rhythm ST.

## 2014-12-10 NOTE — Progress Notes (Signed)
1430:  Pt falling asleep mid sentence.  Will allow pt to rest.  Will attempt PT again later as schedule permits.

## 2014-12-11 LAB — METABOLIC PANEL, BASIC
Anion gap: 11 mmol/L (ref 3.0–18)
BUN/Creatinine ratio: 17 (ref 12–20)
BUN: 12 MG/DL (ref 7.0–18)
CO2: 24 mmol/L (ref 21–32)
Calcium: 7.8 MG/DL — ABNORMAL LOW (ref 8.5–10.1)
Chloride: 103 mmol/L (ref 100–108)
Creatinine: 0.71 MG/DL (ref 0.6–1.3)
GFR est AA: >60 mL/min/{1.73_m2} (ref >60)
GFR est non-AA: >60 mL/min/{1.73_m2} (ref >60)
Glucose: 111 mg/dL — ABNORMAL HIGH (ref 74–99)
Potassium: 4.1 mmol/L (ref 3.5–5.5)
Sodium: 138 mmol/L (ref 136–145)

## 2014-12-11 LAB — TYPE & SCREEN
ABO/Rh(D): AB POS
Antibody screen: NEGATIVE

## 2014-12-11 LAB — LIPASE: Lipase: 938 U/L — ABNORMAL HIGH (ref 73–393)

## 2014-12-11 LAB — EKG, 12 LEAD, SUBSEQUENT
Atrial Rate: 131 {beats}/min
Calculated P Axis: 8 degrees
Calculated R Axis: 24 degrees
Calculated T Axis: 43 degrees
P-R Interval: 122 ms
Q-T Interval: 326 ms
QRS Duration: 76 ms
QTC Calculation (Bezet): 481 ms
Ventricular Rate: 131 {beats}/min

## 2014-12-11 LAB — AMYLASE: Amylase: 102 U/L (ref 25–115)

## 2014-12-11 LAB — AMMONIA: Ammonia, plasma: 42 umol/L — ABNORMAL HIGH (ref 11–32)

## 2014-12-11 LAB — TYPE AND SCREEN
ABO/Rh: AB POS
Antibody Screen: NEGATIVE

## 2014-12-11 MED ORDER — ENOXAPARIN 40 MG/0.4 ML SUB-Q SYRINGE
40 mg/0.4 mL | SUBCUTANEOUS | Status: DC
Start: 2014-12-11 — End: 2014-12-12
  Administered 2014-12-11 – 2014-12-12 (×2): via SUBCUTANEOUS

## 2014-12-11 MED ORDER — METOPROLOL TARTRATE 25 MG TAB
25 mg | Freq: Once | ORAL | Status: AC
Start: 2014-12-11 — End: 2014-12-11

## 2014-12-11 MED ORDER — METOPROLOL TARTRATE 25 MG TAB
25 mg | Freq: Four times a day (QID) | ORAL | Status: DC
Start: 2014-12-11 — End: 2014-12-11
  Administered 2014-12-11 (×2): via ORAL

## 2014-12-11 MED ORDER — METOPROLOL TARTRATE 25 MG TAB
25 mg | Freq: Four times a day (QID) | ORAL | Status: DC
Start: 2014-12-11 — End: 2014-12-12
  Administered 2014-12-11 – 2014-12-12 (×3): via ORAL

## 2014-12-11 MED FILL — PANTOPRAZOLE 40 MG TAB, DELAYED RELEASE: 40 mg | ORAL | Qty: 1

## 2014-12-11 MED FILL — NIMODIPINE 30 MG CAP: 30 mg | ORAL | Qty: 1

## 2014-12-11 MED FILL — FOLIC ACID 1 MG TAB: 1 mg | ORAL | Qty: 1

## 2014-12-11 MED FILL — THERA 400 MCG TABLET: 400 mcg | ORAL | Qty: 1

## 2014-12-11 MED FILL — MAGNESIUM SULFATE 2 GRAM/50 ML IVPB: 2 gram/50 mL (4 %) | INTRAVENOUS | Qty: 50

## 2014-12-11 MED FILL — METOPROLOL TARTRATE 25 MG TAB: 25 mg | ORAL | Qty: 1

## 2014-12-11 MED FILL — LOVENOX 40 MG/0.4 ML SUBCUTANEOUS SYRINGE: 40 mg/0.4 mL | SUBCUTANEOUS | Qty: 0.4

## 2014-12-11 MED FILL — TYLENOL 325 MG TABLET: 325 mg | ORAL | Qty: 2

## 2014-12-11 MED FILL — SYNTHROID 50 MCG TABLET: 50 mcg | ORAL | Qty: 1

## 2014-12-11 MED FILL — NIMODIPINE 30 MG CAP: 30 mg | ORAL | Qty: 2

## 2014-12-11 MED FILL — ASPIRIN 81 MG CHEWABLE TAB: 81 mg | ORAL | Qty: 1

## 2014-12-11 MED FILL — DEEP SEA NASAL 0.65 % SPRAY AEROSOL: 0.65 % | NASAL | Qty: 44

## 2014-12-11 NOTE — Progress Notes (Addendum)
Received care of pt from [facility]. Pt restess and removing clothes.    0830 inc care given then up to recliner for bkft.    0930. Pt states she needs to void walked to BR with 1 assist and walker needs verbal cues to get up and use walker correctly then needs less assistance. BM and void in BR then to recliner . Significant increase in buttock rash since last night.Clearence Cheek at bedside.    1032 PT/OT worked with pt and returned her to recliner as I asked . Pt aware I plan to keep her OOb as much as posssible to day. Appears to stay awake for longer periods and on topic longer.    1200 will return to bed after lunch and bath.     1330 pt assisted with partial bath in recliner would start to wash then fall asleep .  To br to try to void before bed and only a few drops . Walked well with less cues. Rash is increasing to lt side now has 13 clusters that are getting redder and vessicles are larger and redder.    1530 sleeps intermittantly not pulling clothes off as much.when i was putting her to bed earlier I noted  rash has gotten worse since am so placed on full shingles precautions (contact with airborne- after verifying with nursing supervisor)    1645 awake to void to BR with walker and minimal cues to void. To recliner for dinner. Talking with sister on phone.    1930 Bedside shift change report given to Temi (oncoming nurse) byAPril (offgoing nurse). Report included the following information SBAR, Kardex, Intake/Output, MAR, Recent Results, Med Rec Status and Cardiac Rhythm ST.

## 2014-12-11 NOTE — Progress Notes (Signed)
Problem: Self Care Deficits Care Plan (Adult)  Goal: *Acute Goals and Plan of Care (Insert Text)  Occupational Therapy Goals  Initiated 11/30/2014 within 7 day(s), re-evaluated 12/07/14 goals updated.    While bed level:  1. Patient will perform grooming with supervision/set-up   2. Patient will perform upper body dressing with supervision/set-up.  3. Patient will participate in upper extremity therapeutic exercise/activities with supervision/set-up for 10 minutes.   4. Patient will utilize energy conservation techniques during functional activities with verbal cues.    Once bedrest orders removed:    5. Patient will perform lower body dressing with minimal assistance/contact guard assist.  6. Patient will perform toilet transfers with minimal assistance/contact guard assist.  7. Patient will perform all aspects of toileting with minimal assistance/contact guard assist.   OCCUPATIONAL THERAPY TREATMENT    Patient: Renee West (59 y.o. female)  Date: 12/11/2014  Diagnosis: SAH (subarachnoid hemorrhage) (HCC) SAH (subarachnoid hemorrhage) (HCC)       Precautions: Fall  Chart, occupational therapy assessment, plan of care, and goals were reviewed.      ASSESSMENT:  Pt demo toileting, UB dressing and grooming. See functional levels below. Functional mobility from toilet to chair with min A/ CGA for RW mgmt. Pt is seated in chair during grooming task dozing off in midst of task and needs verbal cues for reorientation to task. Pt seems she is awake with eyes open during task, however verbalizes inappropriate events.  Notified nursing.  Progression toward goals:            Improving appropriately and progressing toward goals            Improving slowly and progressing toward goals            Not making progress toward goals and plan of care will be adjusted       PLAN:  Patient continues to benefit from skilled intervention to address the above impairments. Continue treatment per established plan of care.   Discharge Recommendations:  Skilled Nursing Facility  Further Equipment Recommendations for Discharge:TBD       SUBJECTIVE:   Patient stated ???In the movie theater, I'm dreaming.??? (pt's response to "Where do you think you are?")      OBJECTIVE DATA SUMMARY:   Cognitive/Behavioral Status:  Neurologic State: Restless  Orientation Level: Oriented to person, Oriented to situation, Oriented to place  Cognition: Follows commands, Decreased attention/concentration  Safety/Judgement: Fall prevention  Functional Mobility and Transfers for ADLs:              Transfers:  Sit to Stand: Contact guard assistance                             Toilet Transfer : Contact guard assistance (using RW and grab bar)              Balance:  Sitting - Static: Good (unsupported)  Sitting - Dynamic: Fair (occasional)  Standing: With support  Standing - Static: Fair  Standing - Dynamic : Fair  ADL Intervention:  Grooming  Washing Hands: Stand-by assistance (2/2 pt dozing off during task) and washing face    Upper Body Dressing Assistance  Hospital Gown: Minimum  assistance (verbal and visual cues for donning / doffing)    Toileting  Toileting Assistance: Minimum assistance (For thoroughness after BM and vc's for sequencing)    Pain:  No c/o p!  Activity Tolerance:    Fair-  Please refer to the flowsheet for vital signs taken during this treatment.  After treatment:   [X]   Patient left in no apparent distress sitting up in chair  [ ]   Patient left in no apparent distress in bed  [X]   Call bell left within reach  [X]   Nursing notified  [ ]   Caregiver present  [X]   Bed alarm activated (Tabs)      Hershal Coria, COTA  Time Calculation: 23 mins

## 2014-12-11 NOTE — Progress Notes (Signed)
Neurovascular Progress Note      Patient: Renee West MRN: [id number]  SSN: [id number]    Date of Birth: [birth year]  Age: 59 y.o.  Sex: female        Event:  Tachycardia continues.  Beta blocker increasing gradually.    Low grade fever.       Vital Signs  Patient Vitals for the past 24 hrs:   Temp Pulse Resp BP SpO2   12/11/14 0638 - 97 - 127/74 mmHg -   12/11/14 0528 - (!) 101 - 128/79 mmHg -   12/11/14 0400 99.7 ??F (37.6 ??C) (!) 118 20 146/81 mmHg 100 %   12/11/14 0257 - (!) 116 - 143/81 mmHg -   12/11/14 0000 97.2 ??F (36.2 ??C) (!) 110 20 128/65 mmHg 100 %   12/10/14 2306 - (!) 112 - 126/67 mmHg -   12/10/14 2100 - (!) 106 - 123/78 mmHg -   12/10/14 2000 98.3 ??F (36.8 ??C) (!) 107 20 116/79 mmHg 96 %   12/10/14 1823 - (!) 118 - 116/69 mmHg -   12/10/14 1628 98.4 ??F (36.9 ??C) (!) 126 20 134/87 mmHg 97 %   12/10/14 1500 - (!) 124 - 108/79 mmHg -   12/10/14 1315 100.1 ??F (37.8 ??C) (!) 120 20 122/86 mmHg 97 %   12/10/14 1300 - (!) 120 - 122/86 mmHg -   12/10/14 1100 - (!) 133 - 109/80 mmHg -   12/10/14 1018 - (!) 123 - 103/78 mmHg -   12/10/14 0915 - 98 - (!) 166/98 mmHg -       NIHSS Flow Sheet  Total: 0 (12/11/14 0400)     Intake and Output    Intake/Output Summary (Last 24 hours) at 12/11/14 0846  Last data filed at 12/11/14 0110   Gross per 24 hour   Intake    450 ml   Output    800 ml   Net   -350 ml     Data    Recent Results (from the past 24 hour(s))   EKG, 12 LEAD, SUBSEQUENT    Collection Time: 12/10/14 12:09 PM   Result Value Ref Range    Ventricular Rate 131 BPM    Atrial Rate 131 BPM    P-R Interval 122 ms    QRS Duration 76 ms    Q-T Interval 326 ms    QTC Calculation (Bezet) 481 ms    Calculated P Axis 8 degrees    Calculated R Axis 24 degrees    Calculated T Axis 43 degrees    Diagnosis       Sinus tachycardia  Minimal voltage criteria for LVH, may be normal variant  Borderline ECG  When compared with ECG of 06-Dec-2014 06:56,  No significant change was found      CARDIAC PANEL,(CK, CKMB & TROPONIN)    Collection Time: 12/10/14  2:22 PM   Result Value Ref Range    CK 118 26 - 192 U/L    CK - MB 2.9 0.5 - 3.6 ng/ml    CK-MB Index 2.5 0.0 - 4.0 %    Troponin-I, Qt. <0.02 0.0 - 0.045 NG/ML   TYPE & SCREEN    Collection Time: 12/11/14  5:59 AM   Result Value Ref Range    Crossmatch Expiration 12/14/2014     ABO/Rh(D) AB POSITIVE     Antibody screen NEG    AMMONIA    Collection Time: 12/11/14  5:59 AM  Result Value Ref Range    Ammonia 42 (H) 11 - 32 UMOL/L   AMYLASE    Collection Time: 12/11/14  5:59 AM   Result Value Ref Range    Amylase 102 25 - 115 U/L   LIPASE    Collection Time: 12/11/14  5:59 AM   Result Value Ref Range    Lipase 938 (H) 73 - 393 U/L   METABOLIC PANEL, BASIC    Collection Time: 12/11/14  5:59 AM   Result Value Ref Range    Sodium 138 136 - 145 mmol/L    Potassium 4.1 3.5 - 5.5 mmol/L    Chloride 103 100 - 108 mmol/L    CO2 24 21 - 32 mmol/L    Anion gap 11 3.0 - 18 mmol/L    Glucose 111 (H) 74 - 99 mg/dL    BUN 12 7.0 - 18 MG/DL    Creatinine 1.61 0.6 - 1.3 MG/DL    BUN/Creatinine ratio 17 12 - 20      GFR est AA >60 >60 ml/min/1.52m2    GFR est non-AA >60 >60 ml/min/1.86m2    Calcium 7.8 (L) 8.5 - 10.1 MG/DL      Physical Exam  Lying in bed. Examined with nurse.  Crusty dry lesions on right buttock with about 2cm with irregular border. These cross typical upper S1 and S2 dermatome without going down back of leg.  No discomfort when touched but she says these do itch.  She remains jovial and prefers not to have clothes or sheets touching her body.  EVD site intact with sutures in place.       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     1.) Neurologic:  Continues to do well compared to less than 2 weeks ago when in coma.  Preparing for rehab on Monday.  So far tolerating BP  lowering slightly as BB given to help control HR.  Aspirin continues.  Continue nimodipine for 21 days.  No AED.    Follow up cerebral angiography in 6 months.  CT for changes.    2.) Cardiovascular:  Increase beta blocker slowly as SBP will tolerate.    3.) Pulmonary:  Good oxygenation on air.  Saline spray for air sinuses.      4.) Endocrine: Continue levothyroxine.    5.) Hematology: SCDs, LMWH.    6.) Fluids, Electrolytes, and Nutrition: Her fluid balance is negative but she describes eating significant amounts of food from her family that I suspect is not being included in her intake total.  Please follow intake and output carefully.      7.) Gastrointestinal: Lipase slowly decreasing. Suspect this may have been from iron sucrose administration as well a stools becoming heme positive. Nonetheless continue PPI for now. Bowel regimen.     8.) LTD: PIV.     9.) ID: Discussed rash with ID.  Will observe for now.  If progression with dermatomal pattern then consider antiviral therapy. Antipyretics as needed.  Reculture if consider for sepsis. Recheck WBC tomorrow.    10 minutes of neurologic care provided.    Andrey Cota, MD

## 2014-12-11 NOTE — Progress Notes (Signed)
Renee West is a 59 y.o. Female with subarachnoid hemorrhage.?? She was noted to have elevated lipase and liver enzymes.?? She had normal liver enzymes on admission.?? She has had no abdominal pain or vomiting.?? CT and ultrasound noted no biliary abnormality and no gall stones.?? Fatty liver noted.?? Liver enzymes continue to improve with AST 76 ALT 112.?? Lipase continues to improve to 938.?? Hepatitis panel negative. No new problems.     Plan:1. Follow Lipase. Continue diet as tolerated. ??          2. Follow Liver enzymes.??

## 2014-12-11 NOTE — Progress Notes (Addendum)
Assumed pt care; pt awake; no distress noted; Dual NIH with off going nurse; Airborne isolation maintained; Bed in low position; Side rails up x 3; Personal belonging within reach.Will continue to monitor.    1945: Bedside and Verbal shift change report given to Megan (Cabin crew) by Rudean Haskell, RN  ??(offgoing nurse). Report included the following information SBAR, Kardex, MAR and Recent Results.

## 2014-12-12 LAB — CBC W/O DIFF
HCT: 28.3 % — ABNORMAL LOW (ref 35.0–45.0)
HGB: 9.4 g/dL — ABNORMAL LOW (ref 12.0–16.0)
MCH: 29.2 PG (ref 24.0–34.0)
MCHC: 33.2 g/dL (ref 31.0–37.0)
MCV: 87.9 FL (ref 74.0–97.0)
MPV: 10 FL (ref 9.2–11.8)
PLATELET: 512 10*3/uL — ABNORMAL HIGH (ref 135–420)
RBC: 3.22 M/uL — ABNORMAL LOW (ref 4.20–5.30)
RDW: 15.9 % — ABNORMAL HIGH (ref 11.6–14.5)
WBC: 9.7 10*3/uL (ref 4.6–13.2)

## 2014-12-12 LAB — METABOLIC PANEL, BASIC
Anion gap: 10 mmol/L (ref 3.0–18)
BUN/Creatinine ratio: 14 (ref 12–20)
BUN: 10 MG/DL (ref 7.0–18)
CO2: 25 mmol/L (ref 21–32)
Calcium: 8 MG/DL — ABNORMAL LOW (ref 8.5–10.1)
Chloride: 102 mmol/L (ref 100–108)
Creatinine: 0.73 MG/DL (ref 0.6–1.3)
GFR est AA: >60 mL/min/{1.73_m2} (ref >60)
GFR est non-AA: >60 mL/min/{1.73_m2} (ref >60)
Glucose: 99 mg/dL (ref 74–99)
Potassium: 4.2 mmol/L (ref 3.5–5.5)
Sodium: 137 mmol/L (ref 136–145)

## 2014-12-12 LAB — PHOSPHORUS: Phosphorus: 2.9 MG/DL (ref 2.5–4.9)

## 2014-12-12 LAB — PROTHROMBIN TIME + INR
INR: 1.1 (ref 0.8–1.2)
Prothrombin time: 13.6 s (ref 11.5–15.2)

## 2014-12-12 LAB — CALCIUM, IONIZED: Ionized Calcium: 1.02 MMOL/L — ABNORMAL LOW (ref 1.12–1.32)

## 2014-12-12 LAB — HEPATIC FUNCTION PANEL
A-G Ratio: 0.5 — ABNORMAL LOW (ref 0.8–1.7)
ALT (SGPT): 85 U/L — ABNORMAL HIGH (ref 13–56)
AST (SGOT): 48 U/L — ABNORMAL HIGH (ref 15–37)
Albumin: 2.5 g/dL — ABNORMAL LOW (ref 3.4–5.0)
Alk. phosphatase: 128 U/L — ABNORMAL HIGH (ref 45–117)
Bilirubin, direct: 0.2 MG/DL (ref 0.0–0.2)
Bilirubin, total: 0.4 MG/DL (ref 0.2–1.0)
Globulin: 4.7 g/dL — ABNORMAL HIGH (ref 2.0–4.0)
Protein, total: 7.2 g/dL (ref 6.4–8.2)

## 2014-12-12 LAB — PTT: aPTT: 28.7 s (ref 23.0–36.4)

## 2014-12-12 LAB — LIPASE: Lipase: 922 U/L — ABNORMAL HIGH (ref 73–393)

## 2014-12-12 LAB — AMMONIA: Ammonia, plasma: 51 umol/L — ABNORMAL HIGH (ref 11–32)

## 2014-12-12 LAB — AMYLASE: Amylase: 109 U/L (ref 25–115)

## 2014-12-12 LAB — MAGNESIUM: Magnesium: 2.8 mg/dL — ABNORMAL HIGH (ref 1.8–2.4)

## 2014-12-12 MED ORDER — ACETAMINOPHEN 650 MG RECTAL SUPPOSITORY
650 mg | RECTAL | Status: AC | PRN
Start: 2014-12-12 — End: ?

## 2014-12-12 MED ORDER — THIAMINE MONONITRATE 100 MG TABLET
100 mg | ORAL_TABLET | Freq: Every day | ORAL | Status: AC
Start: 2014-12-12 — End: ?

## 2014-12-12 MED ORDER — METOPROLOL TARTRATE 25 MG TAB
25 mg | ORAL_TABLET | Freq: Two times a day (BID) | ORAL | Status: AC
Start: 2014-12-12 — End: ?

## 2014-12-12 MED ORDER — FOLIC ACID 1 MG TAB
1 mg | ORAL_TABLET | Freq: Every day | ORAL | Status: AC
Start: 2014-12-12 — End: ?

## 2014-12-12 MED ORDER — LEVOTHYROXINE 50 MCG TAB
50 mcg | ORAL_TABLET | Freq: Every day | ORAL | Status: AC
Start: 2014-12-12 — End: ?

## 2014-12-12 MED ORDER — ALBUTEROL SULFATE 0.083 % (0.83 MG/ML) SOLN FOR INHALATION
2.5 mg /3 mL (0.083 %) | RESPIRATORY_TRACT | Status: AC | PRN
Start: 2014-12-12 — End: ?

## 2014-12-12 MED ORDER — ENOXAPARIN 40 MG/0.4 ML SUB-Q SYRINGE
40 mg/0.4 mL | INJECTION | SUBCUTANEOUS | Status: AC
Start: 2014-12-12 — End: ?

## 2014-12-12 MED ORDER — ASPIRIN 81 MG CHEWABLE TAB
81 mg | ORAL_TABLET | Freq: Every day | ORAL | Status: AC
Start: 2014-12-12 — End: ?

## 2014-12-12 MED ORDER — ACETAMINOPHEN 325 MG TABLET
325 mg | ORAL_TABLET | ORAL | Status: AC | PRN
Start: 2014-12-12 — End: ?

## 2014-12-12 MED ORDER — THERAPEUTIC MULTIVITAMIN TAB
ORAL_TABLET | Freq: Every day | ORAL | Status: AC
Start: 2014-12-12 — End: ?

## 2014-12-12 MED FILL — THERA 400 MCG TABLET: 400 mcg | ORAL | Qty: 1

## 2014-12-12 MED FILL — NIMODIPINE 30 MG CAP: 30 mg | ORAL | Qty: 1

## 2014-12-12 MED FILL — PANTOPRAZOLE 40 MG TAB, DELAYED RELEASE: 40 mg | ORAL | Qty: 1

## 2014-12-12 MED FILL — LOVENOX 40 MG/0.4 ML SUBCUTANEOUS SYRINGE: 40 mg/0.4 mL | SUBCUTANEOUS | Qty: 0.4

## 2014-12-12 MED FILL — METOPROLOL TARTRATE 25 MG TAB: 25 mg | ORAL | Qty: 1

## 2014-12-12 MED FILL — ASPIRIN 81 MG CHEWABLE TAB: 81 mg | ORAL | Qty: 1

## 2014-12-12 MED FILL — THIAMINE MONONITRATE 100 MG TABLET: 100 mg | ORAL | Qty: 1

## 2014-12-12 MED FILL — SYNTHROID 50 MCG TABLET: 50 mcg | ORAL | Qty: 1

## 2014-12-12 MED FILL — MAGNESIUM SULFATE 2 GRAM/50 ML IVPB: 2 gram/50 mL (4 %) | INTRAVENOUS | Qty: 50

## 2014-12-12 MED FILL — FOLIC ACID 1 MG TAB: 1 mg | ORAL | Qty: 1

## 2014-12-12 NOTE — Progress Notes (Signed)
Gastrointestinal Progress Note    Patient Name: Renee West    ZOXWR'U Date: 12/12/2014    Admit Date: 11/28/2014    Subjective:     Renee West is a 59 y.o. Female with subarachnoid hemorrhage.  She was noted to have elevated lipase and liver enzymes.  She had normal liver enzymes on admission.  She has had no abdominal pain or vomiting.  CT and ultrasound noted no biliary abnormality and no gall stones.  Fatty liver noted.  Liver enzymes continue to improve with AST 48  ALT 85.  Lipase continues to improve to 922.  Hepatitis panel negative. No new problems.           Current Facility-Administered Medications   Medication Dose Route Frequency   ??? enoxaparin (LOVENOX) injection 40 mg  40 mg SubCUTAneous Q24H   ??? metoprolol tartrate (LOPRESSOR) tablet 25 mg  25 mg Oral Q6H   ??? levothyroxine (SYNTHROID) tablet 50 mcg  50 mcg Oral ACB   ??? sodium chloride (OCEAN) 0.65 % nasal spray 2 Spray  2 Spray Both Nostrils Q12H   ??? pantoprazole (PROTONIX) tablet 40 mg  40 mg Oral Q12H   ??? Thiamine Mononitrate (B-1) tablet 100 mg  100 mg Oral DAILY   ??? therapeutic multivitamin (THERAGRAN) tablet 1 Tab  1 Tab Oral DAILY   ??? folic acid (FOLVITE) tablet 1 mg  1 mg Oral DAILY   ??? acetaminophen (TYLENOL) tablet 650 mg  650 mg Oral Q4H PRN   ??? niMODipine (NIMOTOP) capsule 30 mg  30 mg Oral Q2H   ??? aspirin chewable tablet 81 mg  81 mg Oral DAILY   ??? ondansetron (ZOFRAN) injection 1 mg  1 mg IntraVENous Q6H PRN   ??? acetaminophen (TYLENOL) suppository 650 mg  650 mg Rectal Q4H PRN   ??? magnesium sulfate 2 g/50 ml IVPB (premix or compounded)  2 g IntraVENous Q12H   ??? albuterol (PROVENTIL VENTOLIN) nebulizer solution 2.5 mg  2.5 mg Nebulization Q4H PRN   ??? sodium chloride (NS) flush 10 mL  10 mL InterCATHeter PRN   ??? sodium chloride (NS) flush 10-40 mL  10-40 mL InterCATHeter Q8H   ??? sodium chloride (NS) flush 10-30 mL  10-30 mL InterCATHeter PRN   ??? bacitracin 500 unit/gram packet 1 Packet  1 Packet Topical PRN           Objective:     Physical Exam:    BP 129/62 mmHg   Pulse 87   Temp(Src) 98.1 ??F (36.7 ??C)   Resp 18   Ht  (1.676 m)   Wt 100 kg (220 lb 7.4 oz)   BMI 35.60 kg/m2   SpO2 100%  General appearance: alert, cooperative, no distress, appears stated age  Abdomen: soft, non-tender. Bowel sounds normal. No masses,  no organomegaly, no rebound or guarding, no ascites    Data Review:    Labs: Results:       Chemistry Recent Labs      12/12/14   0448  12/11/14   0559  12/10/14   0525   GLU  99  111*  113*   NA  137  138  137   K  4.2  4.1  3.9   CL  102  103  103   CO2  BUN  CREA  0.73  0.71  0.55*   CA  8.0*  7.8*  8.0*   AGAP  10  11  11    BUCR  14  17  18    AP  128*   --    --    TP  7.2   --    --    ALB  2.5*   --    --    GLOB  4.7*   --    --    AGRAT  0.5*   --    --       CBC w/Diff Recent Labs      12/12/14   0448   WBC  9.7   RBC  3.22*   HGB  9.4*   HCT  28.3*   PLT  512*      Coagulation Recent Labs      12/12/14   0448   PTP  13.6   INR  1.1   APTT  28.7       Liver Enzymes Recent Labs      12/12/14   0448   TP  7.2   ALB  2.5*   AP  128*   SGOT  48*   ALT  85*          Assessment:   1. Abnormal lipase: She has elevated lipase but no pancreatitis on imaging and no symptoms to suggest ongoing pancreatitis.  No other source noted including no obvious medication as a source.  Lipase improving nicely with her improved clinical status.    2. Abnormal liver enzymes: She has elevated liver enzymes in a hepatocellular pattern.  Normal liver enzymes on admission.  Most likely due to underlying fatty liver and due to her other ongoing medical problems.  Liver enzymes improving with her overall clinical status. Negative hepatitis panel.    Recommendation:   1. Continue diet as tolerated.   2. No further recommendations at this time. Will sign off. Please call with any questions.         Juliane Lack, MD  December 12, 2014

## 2014-12-12 NOTE — Progress Notes (Addendum)
Hospital to SNF SBAR Handoff - Renee West                                                                        59 y.o.   female    [facility]   Room: 2114/01    [facility] 2W NEURO MED  Unit Phone# :  [phone]      [facility]  [facility] 2W NEURO MED  [address]  [place] [address]  Dept: [phone]  Loc: [phone]                    SITUATION     Admitted:  11/28/2014         Attending Provider:  Andrey Cota, MD       Consultations:  None    PCP:  PROVIDER UNKNOWN   None    Treatment Team: Attending Provider: Andrey Cota, MD; Scribe: Isla Pence; Consulting Provider: Andrey Cota, MD; Consulting Provider: Bethann Goo, MD; Care Manager: Lurena Nida; Consulting Provider: Sherran Needs, MD; Care Manager: Stan Head; Care Manager: Barbarann Ehlers, RN; Occupational Therapy Assistant: Meyer Russel, COTA; Physical Therapy Assistant: Joseph Berkshire, PTA    Admitting Dx:  [facility] (subarachnoid hemorrhage) [facility])       Principal Problem: SAH (subarachnoid hemorrhage) (HCC)    * No surgery found * of      BY: * Surgery not found *             ON: * No surgery found *                  Code Status: Full Code                Advance Directives:   Advance Care Planning 12/02/2014   Patient's Healthcare Decision Maker is: Legal Next of Kin   Confirm Advance Directive None    (Send w/patient)   No Doesnt Have       Isolation:  There are currently no Active Isolations       MDRO: No current active infections    Special Equipment needed: no  Type of equipment:         BACKGROUND     Allergies:  Allergies   Allergen Reactions   ??? Other Food Other (comments)     shrimp       Past Medical History   Diagnosis Date   ??? Hypothyroid    ??? DVT (deep venous thrombosis) (HCC)    ??? Warfarin anticoagulation    ??? 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 Hydrocephalus 11/28/2014   ??? 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 Hx hysterectomy     ??? Hx thyroidectomy   ??? 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 Hx csf shunt  11/28/2014     Right frontal ventriculostomy       Prescriptions prior to admission   Medication Sig   ??? WARFARIN  SODIUM (COUMADIN PO) Take  by mouth.   ??? PAROXETINE HCL (PAXIL PO) Take  by mouth.       Hard scripts included in transfer packet no    Vaccinations:    There is no immunization history on file for this patient.    Readmission Risks:    Known Risks:             The [employer] Index tool is an evidenced based tool that has more automatic generated information. The tool looks at many different items such as the age of the patient, how many times they were admitted in the last calendar year, current length of stay in the hospital and their diagnosis. All of these items are pulled automatically from information documented in the chart from various places and will generate a score that predicts whether a patient is at low (less than 13), medium (13-20) or high (21 or greater) risk of being readmitted.        ASSESSMENT                Temp: 98.1 ??F (36.7 ??C) (12/12/14 0725) Pulse (Heart Rate): 87 (12/12/14 0725)     Resp Rate: 18 (12/12/14 0725)           BP: 129/62 mmHg (12/12/14 0725)     O2 Sat (%): 100 % (12/12/14 0725)     Weight: 100 kg (220 lb 7.4 oz)    Height: 5\' 6"  (167.6 cm) (11/29/14 1225)       If above not within 1 hour of discharge:    BP:_____  P:____  R:____ T:_____ O2 Sat: ___%  O2: ______    Active Orders   Diet    DIET REGULAR 50GM Fat         Orientation: oriented to time, place, person and situation     Active Behaviors: None                                   Active Lines/Drains:  (Peg Tube / Foley / CL or S/L?): no    Urinary Status: Voiding     Last BM: Last Bowel Movement Date: 12/11/14     Skin Integrity: Incision (comment)    Wound Sacral/coccyx Posterior-DRESSING STATUS: Clean, dry, and intact    Wound Sacral/coccyx Posterior-DRESSING TYPE:  (Mepilex)    Mobility: Slightly limited   Weight Bearing Status: WBAT (Weight Bearing as Tolerated)      Gait Training  Assistive Device: Gait belt, Walker, rolling  Ambulation - Level of Assistance: Contact guard assistance  Distance (ft): 3 Feet (ft)         Lab Results   Component Value Date/Time    GLUCOSE 99 12/12/2014 04:48 AM    HEMOGLOBIN A1C 6.4 11/28/2014 11:45 AM    INR 1.1 12/12/2014 04:48 AM    INR 1.1 12/09/2014 04:46 AM    HGB 9.4 12/12/2014 04:48 AM    HGB 10.0 12/09/2014 04:46 AM        RECOMMENDATION     See After Visit Summary (AVS) for:  ?? Discharge instructions  ?? After Hospital Care Plan   ?? Special equipment needed (entered pre-discharge by Care Management)  ?? Medication Reconciliation    ?? Follow up Appointment(s)         Report given/sent by:  Darrel Hoover, RN  Verbal report given to: Wende Mott  FAXED to:  [phone]         Estimated discharge time:  12/12/2014 at 1015

## 2014-12-12 NOTE — Discharge Summary (Addendum)
Neurovascular Discharge Summary     Patient ID:  Renee West  [id number]  59 y.o.  August 25, 1955    Admit Date: 11/28/2014    Discharge Date: 12/12/2014      Principal Diagnosis: SAH, LVA ruptured dissecting pseudoaneurysm    Principal Procedure:   1. Cerebral angiography    2.) Right frontal ventriculostomy  3.) LCFV CVL  4.) LVA pseudoaneurysm endovascular test occlusion, LVA pseudoaneurysm endovascular sacrifice deconstruction, cerebral angiography    Secondary Diagnoses:   Patient Active Problem List   Diagnosis Code   ??? Anticoagulant causing adverse effect in therapeutic use T45.515A   ??? Malignant hypertension requiring acute intensive management I10   ??? Respiratory center failure requring intubation G93.89   ??? SAH (subarachnoid hemorrhage) (HCC) I60.9   ??? Cerebral edema (HCC) G93.6   ??? Pulmonary edema J81.1   ??? Hypoxemia R09.02   ??? Hydrocephalus G91.9   ??? 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 EVD right frontal Z98.89   ??? Prediabetes R73.09   ??? Hypothyroid E03.9   ??? Anemia D64.9   ??? Iron deficiency E61.1   ??? SIRS (systemic inflammatory response syndrome) (HCC) R65.10   ??? LVA intracranial ruptured dissecting pseudoaneurysm endovascular sacrifice Z98.89, Z86.79   ??? 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 Elevated pancreatic enzyme R74.8   ??? Hyperpyrexia R50.9   ??? Cerebral vasospasm I67.848     Hospital Course:  This is a 59 year-old right-handed female on warfarin anticoagulation for DVT presented with unresponsive then combative with respiratory distress requiring intubation found to have SAH. Dr. Su Ley called Dr. Excell Seltzer about this patient in the [facility] ED.  The patient suddenly stopped participating in the conversation.  When the family went to see her she was sitting on the edge of the bed double forwarded at the waist. She did not respond to verbal or tactile stimulation.  She was bleeding from her mouth.  They called 911.  The patient was brought to the [facility] ED.     The patient became more aroused and was combative but not following commands and had respiratory distress.  She was intubated.  CT showed diffuse SAH especially in the posterior fossa. The ventricles were small with sulcal crowding consistent with cerebral edema.  Blood pressure control was initiated.  Dr. Su Ley was contacted.  INR 2.3 on warfarin for DVT.  Sedation maintained with midazolam. Levetiracetam was initiated.  Reversal agents were not available prior to her transfer to the [facility] ED.    She was given [name] in the [facility] ED as well as vitamin k iv. She was not following commands but moving during attempted CT.  Chemical paralytics given in addition to sedation.  Repeat CT did not reveal any definitive recurrent hemorrhage but the temporal horns of the lateral ventricles, third ventricle, aqueduct of Sylvius, and fourth ventricle were larger.  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      PCC 4 factor KCentra was given with INR remeasured 1.0.  EVD was placed showing elevated intracranial pressure which was brought down to the upper limits of normal range with a small amount of CSF drainage.  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  There was also an LM3 pseudoaneurysm.  The patient was having some cardiopulmonary issues and aneurysm repair was deferred.    With low normal CPP she was moving her extremities purposefully trying to pull on  her ETT but not following commands.  Continuous sedation was required to maintain her safety and comfort.  Repeat CT showed stable ventricular size with right frontal ventriculostomy catheter just crossing midline with tip in the frontal horn of the left lateral ventricle.  There was interval hematoma in the right middle frontal sulcus as well as the scalp adjacent to the ventriculostomy site.      1.) Neurologic: Patient underwent embolization of LVA pseudoaneurysm  hospital day 2. No neurophysiologic monitoring changes during test occlusion.  LVA dissecting 3.40mm fusiform pseudoaneurysm with antegrade stasis after embolization. LVA fills retrograde from VBJ into LPICA during RVA injection but no pseudoaneurysm filling. EVD decreased from 25 cm to 15 cm. Neuro exam improved after draining some CSF when EVD was decrease to 15 cm in the setting of LVA fusiform pseudoaneurysm being secured. Aspirin therapy started to help prevent embolic formation along the coils. SBP goal for pressors 90-120 mmHg and 120-150 mmHg for antihypertensives.     On day 8 patient underwent cerebral angiography with vasospasm treatment. It revealed LVA dissecting fusiform pseudoaneurysm no residual filling. Multifocal moderate vasospasm improved after the intra-arterial administration of vasodilator. Patient more fidgety that night. SBP autoregulated to 180-220 mmHg. EVD did not drain and removed on day 8.    Continues to do well compared to less than 2 weeks ago when in coma.  So far tolerating BP lowering slightly as BB given to help control HR.  Aspirin continued. No AED.    Follow up cerebral angiography in 6 months. CT for changes.    2.) Cardiovascular:  Patient was started on pressors to keep SBP within goal parameters initially. Significant fluctuations in blood pressure was concerning for SIRS/sepsis. TTE no RWMA with initially normal cardiac enzymes which have elevate slightly in pressors. Patient diuresed with furosemide during hospitalization. Currently increasing beta blocker slowly as SBP will tolerate.    3.) Pulmonary:  Patient was intubated upon arrival. LLL atelectasis versus aspiration pneumonitis was thought of. Vent was maintained normoxia and eucapnia. She was extubated on day 4. Nebs standing and as needed was used. Good oxygenation on air now. Saline spray for air sinuses.      4.) Endocrine: Continue levothyroxine as it was restarted during  hospitalization. TFT upon admission revealed hyperthyroidism. Repeated revealed decrease in TSH. Levothyroxine restarted at lower amount.      5.) Hematology: Hgb lower than ideal goal of 10 for cerebral ischemia and myocardial ischemia. She was transfused 1 unit of PRBC on the second day without much improvement in hgb. Patient transfused again 1 unit of PRBC. Repeat Hgb stablized LE [place] no acute DVT. SCDs. Iron deficiency anemia. Replete iron iv given during hospitalization. LMWH continued.    6.) Fluids, Electrolytes, and Nutrition: Tolerating PO intake.     7.) Gastrointestinal: Lipase slowly decreasing after being elevated. Suspect this may have been from iron sucrose administration as well a stools becoming heme positive. Nonetheless continue PPI for now. Bowel regimen.     8.) ID: Discussed rash with ID.  Will observe for now.  If progression with dermatomal pattern then consider antiviral therapy. WBC: 9.7.    Diet: Cardiac Diet    Disposition: To rehab facility. Patient is to follow up with PCP and Dr. Excell Seltzer 2 weeks after being discharge from rehab facility.     Discharged Condition: Stable.    Patient Instructions:  Current Discharge Medication List      START taking these medications    Details  acetaminophen (TYLENOL) 650 mg suppository Insert 1 Suppository into rectum every four (4) hours as needed (Temperature greater than 100.5 degrees Fahrenheit,  if NPO).  Qty: 1 Suppository, Refills: 0      acetaminophen (TYLENOL) 325 mg tablet Take 2 Tabs by mouth every four (4) hours as needed (Temperature greater than 100.5 degrees Fahrenheit,  if taking PO).  Qty: 1 Tab, Refills: 0      albuterol (PROVENTIL VENTOLIN) 2.5 mg /3 mL (0.083 %) nebulizer solution 3 mL by Nebulization route every four (4) hours as needed for Wheezing.  Qty: 24 Each, Refills: 0      aspirin 81 mg chewable tablet Take 1 Tab by mouth daily.  Qty: 1 Tab, Refills: 0      enoxaparin (LOVENOX) 40 mg/0.4 mL 0.4 mL by SubCUTAneous route every  twenty-four (24) hours.  Qty: 1 Syringe, Refills: 0      folic acid (FOLVITE) 1 mg tablet Take 1 Tab by mouth daily.  Qty: 1 Tab, Refills: 0      levothyroxine (SYNTHROID) 50 mcg tablet Take 1 Tab by mouth Daily (before breakfast).  Qty: 1 Tab, Refills: 0      metoprolol tartrate (LOPRESSOR) 25 mg tablet Take 2 Tabs by mouth every twelve (12) hours.  Qty: 1 Tab, Refills: 0      therapeutic multivitamin (THERAGRAN) tablet Take 1 Tab by mouth daily.  Qty: 1 Tab, Refills: 0      Thiamine Mononitrate (B-1) 100 mg tablet Take 1 Tab by mouth daily.  Qty: 1 Tab, Refills: 0      Lansoprazole 30 mg                                  Take 1 Tab by mouth daily. Qty: 1 Tab, Refills: 0   STOP taking these medications       WARFARIN SODIUM (COUMADIN PO) Comments:   Reason for Stopping:         PAROXETINE HCL (PAXIL PO) Comments:   Reason for Stopping:               Vital Signs:  Patient Vitals for the past 4 hrs:   Temp Pulse Resp BP SpO2   12/12/14 0725 98.1 ??F (36.7 ??C) 87 18 129/62 mmHg 100 %   12/12/14 0615 - (!) 102 - 135/76 mmHg -       Physical Examination:  General: Sitting on commode.  Neuro: Patient eyelids open. Bilateral proptosis. Pupils 2 and reactive. She is able to count fingers in all four visual fields. She lifts all extremities spontaneously.    Wound: Suture at old EVD entrance site.   Pulmonary: Decrease breath sounds throughout.  CV: S1 and S2 heard.  Abd: Obese, NT, ND, NG.    Extremities: Distal pulses 1+.  Skin: Lower back area with wheals with blisters and drainage noted.     Signed:  Bing Plume, NP  12/12/2014  8:25 AM

## 2014-12-12 NOTE — Other (Signed)
Patient Name [place] Sex DOB SSN Address Phone     Renee West, Renee West [id number] Female August 27, 1955 [id number] [place] [phone] (Home)        CSN:     [id number]        [place] Date: Admit Time Room Bed     Nov 28, 2014 2:25 AM 2114 [[id number]] 01 [[id number]]        Attending Providers      Provider Pager From To     Lamonte Sakai, DO  11/28/14 11/28/14     Vira Blanco, MD  11/28/14 12/12/14        Emergency Contact(s)      Name Relation Home Work Mobile     Edroy R Sister [phone]         Utilization Review          Neurology [facility] - Care Day 14 (12/11/2014) by Cy Blamer      Review Entered Review Status     12/12/2014 Completed     Details          Care Day: 14 Care Date: 12/11/2014 Level of Care: Telemetry     Guideline Day 3      Level Of Care     (X) * Activity level acceptable     ( ) * Discharge     ( ) * Complete discharge planning          Clinical Status     (X) * No infection, or status acceptable     (X) * Isolation not needed, or status acceptable     (X) * Respiratory status acceptable     (X) * Pain and nausea absent or adequately managed     (X) * Ventilatory status acceptable     (X) * Neurologic problems absent or stabilized     (X) * Muscle or nerve damage absent or stable     ( ) * General Discharge Criteria met          Interventions     (X) * Intake acceptable     ( ) * No inpatient interventions needed          12/12/2014 3:05 PM EDT by Harriett Sine     Subject: Additional Clinical Information     June 12; vs--98.8-101-20, bp 131/76, sat 100 on RA.     labs--gluc 111, Ca 7.8 , Lipase 938, amylase 102. Ammonia 42.     Tachycardia continues. Beta blocker increasing gradually.    Physical ExamLying in bed.. Crusty dry lesions onR buttock w about 2cm with irregular border. These cross typical upper S1 and S2 dermatome without going down back of leg. No discomfort when touched but she says  these do itch. remains jovial and prefers not to have clothes or sheets touching body. EVD site intact w sutures in place.    ID: Discussed rash w ID. observe for now. If progres with dermatomal pattern then cons antiviral tx. Antipyretics PRN. Reculture if consider for sepsis. Recheck WBC tom.    Meds; Tylenol 650 mg po x 1, asa 81 mg po daily, Lovenox 40 mg sc q 27P,   Folic acid 1 mg po daily, synthroid 50 mcg po daily, Mag Sulfate 2 g iv q 12h, Lopressor 12.5 mg po q 6h, lopressor 25 mg po q 6h, Nimotop 30 mg po q 2h, Protonix 40 mg po q 12h, MVI w thiamine po daily.                            *  Milestone          Additional Notes     Neuro exam - Oriented x 4 at 2015 w decreased attention /concentration. Follows commands.           Orders; Reg diet, PT, OT. Tele, neuro checks with NIH stroke scale assessment per Neuro unit routine.           Per Nursing ;      When i was putting her to bed earlier I noted rash has gotten worse since am so placed on full shingles precautions (contact with airborne- after verifying with nursing supervisor).                       Neu[facility] - Care Day 13 (12/10/2014) by Cy Blamer      Review Entered Review Status     12/12/2014 Completed     Details          Care Day: 13 Care Date: 12/10/2014 Level of Care: Telemetry     Guideline Day 3      Level Of Care     (X) * Activity level acceptable     12/12/2014 2:40 PM EDT by Harriett Sine     AMBULATED 3 FEET ON JUNE 10 WITH PT.          ( ) * Discharge     ( ) * Complete discharge planning          Clinical Status     (X) * No infection, or status acceptable     (X) * Isolation not needed, or status acceptable     (X) * Respiratory status acceptable     (X) * Pain and nausea absent or adequately managed     (X) * Ventilatory status acceptable     ( ) * Neurologic problems absent or stabilized     (X) * Muscle or nerve damage absent or stable     ( ) * General Discharge Criteria met          Interventions      (X) * Intake acceptable     ( ) * No inpatient interventions needed          12/12/2014 2:49 PM EDT by Harriett Sine     Subject: Additional Clinical Information     Per Neurology; Tachycardia increases with fever with slight decrease in BP.PER Neuro;   12 days s/p [facility] HH5 F3 from a ruptured LVA dissecting pseudoaneurysm and 11 days s/p embolization of LVA dissecting fusiform pseudoaneurysm. 1.) Neurologic: Continues to do well compared to less than 2 weeks ago when in coma. Preparing for rehab on Monday. Allow SBP to autoregulate as cardiopulmonary status tolerates. Aspirin continues. Continue nimodipine for 21 days. No AED.Follow up cerebral angiography in 6 months. CT for changes.2.) Cardiovascular: Tachycardic continues with increases during mildly febrile periods. Start low dose beta blocker if SBP will tolerate.3) Pulm ; Saline spray for air sinuses          12/12/2014 2:49 PM EDT by Harriett Sine     Subject: Additional Clinical Information     4.) Endocrine: Hypothyroidism therapy held as initial TFT suggested hyperthyroidism from exogenous hormone use. Repeat TFT in normal limits without therapy. Restarted patient on low dose levothyroxine. Recheck TFT levels at 1 month. 5.) Hematology: Hgb stable. LE [place] with no DVT noted. SCDs. Can start LMWH DVT prophylaxis.6.) Fluids, Electrolytes, and Nutrition: Tolerating PO intake. Patient heplock.  Vitamins given. 7.) Gastrointestinal: GI input appreciated for increase in lipase and amylase which are trending down. Suspect this may have been from iron sucrose administration as well a stools becoming heme positive. Nonetheless continue PPI for now. Minimize other medications that might be problematic as indicated. Bowel regimen. .          12/12/2014 2:40 PM EDT by Harriett Sine     Subject: Additional Clinical Information     June 11;     vs--100.1-120-20, 122/86, sat 97 on RA.     No new labs or imaging .      Per GI; SAH is Dx. Marland Kitchen She was noted to have elevated lipase and liver enzymes. She had normal liver enzymes on admission. She has had no abdominal pain or vomiting. CT and ultrasound noted no biliary abnormality and no gall stones. Fatty liver noted. Liver enzymes continue to improve with AST 76 ALT 112. Lipase continues to improve to 973. Hepatitis panel pending. No new problems. Plan:1. Follow Lipase. Continue diet as tolerated. 2. Follow Liver enzymes. Await hepatitis panel.     Meds; asa 81 mg po daily, folvite 1 mg po daily, tylenol 650 mg po x 2, synthroid 50 mcg po daily, MOM po 30 ml, Mag sulfate 2 g iv x 2 runs today. Nimotop 30 mg po q 2h. , protonix 40 mg po q 12h, MVI and thiamine po daily.     Reg diet, PT, OT. Tele, neuro checks with NIH stroke scale assessment per Neuro unit routine.                            * Milestone

## 2014-12-12 NOTE — Other (Addendum)
0830: Notified by Marijo File, RN Crae manager pt transporting to [facility] in [place], [place] today at 10am. Given transport form with PCS filled out in entirety. Awaiting discharge summary  0845: Public affairs consultant Np stated she was completing discharge summary  1000: Faxed discharge summary to [facility]      Orson Gear RN BSN  Outcomes Manager  Pager # [phone]

## 2014-12-12 NOTE — Progress Notes (Addendum)
Bedside shift report received from [facility], [place]. Patient resting, airborne and contact isolation in place, dual NIH completed. No signs of distress,  Call bell in reach. Side rails X 3  1004 Report given to Wende Mott at [facility] facility.  1026 Patient discharged.

## 2014-12-13 ENCOUNTER — Non-Acute Institutional Stay (SKILLED_NURSING_FACILITY): Payer: BC Managed Care – PPO | Admitting: Adult Health

## 2014-12-13 ENCOUNTER — Encounter: Payer: Self-pay | Admitting: Adult Health

## 2014-12-13 DIAGNOSIS — D509 Iron deficiency anemia, unspecified: Secondary | ICD-10-CM

## 2014-12-13 DIAGNOSIS — I609 Nontraumatic subarachnoid hemorrhage, unspecified: Secondary | ICD-10-CM

## 2014-12-13 DIAGNOSIS — I10 Essential (primary) hypertension: Secondary | ICD-10-CM | POA: Diagnosis not present

## 2014-12-13 DIAGNOSIS — R7303 Prediabetes: Secondary | ICD-10-CM

## 2014-12-13 DIAGNOSIS — E43 Unspecified severe protein-calorie malnutrition: Secondary | ICD-10-CM

## 2014-12-13 DIAGNOSIS — E039 Hypothyroidism, unspecified: Secondary | ICD-10-CM | POA: Diagnosis not present

## 2014-12-13 DIAGNOSIS — B029 Zoster without complications: Secondary | ICD-10-CM | POA: Diagnosis not present

## 2014-12-13 DIAGNOSIS — R7309 Other abnormal glucose: Secondary | ICD-10-CM

## 2014-12-13 NOTE — Progress Notes (Signed)
Patient ID: Katrina Donaldson, female   DOB: 1955/08/27, 59 y.o.   MRN: [id number]   12/13/2014  Facility:  Nursing Home Location:  [place] Room Number: 336-1 LEVEL OF CARE:  SNF (31)   Chief Complaint  Patient presents with  . Hospitalization Follow-up    SAH, LVA ruptured dissecting pseudoaneurysm, hypertension, hypothyroidism, anemia, prediabetes and protein-calorie malnutrition    HISTORY OF PRESENT ILLNESS:  This is a 59 year old female who has been admitted to [facility] on 12/12/14 from [facility]. She was visiting out of town with her family. She suddenly stopped talking and was unresponsive. There was blood coming from her mouth so her family called 43 and was brought to hospital. In the hospital she was more aroused and was combative. She did not follow any commands and had respiratory distress. She was intubated. CT showed diffuse SAH especially in the posterior fossa. The ventricles were small with sulcal crowding consistent with cerebral edema. She has INR of 2.3 as she was on warfarin for DVT. Her anticoagulation was reversed. Repeat CT did not reveal any definitive recurrent hemorrhage but the temporal horns of the lateral ventricles , third ventricle, aqueduct of Sylvius, and fourth ventricle were larger. 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 She had EVD placement.  She has been admitted for a short-term rehabilitation.  PAST MEDICAL HISTORY:   Hypertension, hypothyroidism, anemia and prediabetes   CURRENT MEDICATIONS: Reviewed per MAR/see medication list  Allergies  Allergen Reactions  . Shrimp [Shellfish Allergy]     REVIEW OF SYSTEMS: unable to obtain due to confusion  PHYSICAL EXAMINATION  GENERAL: no acute distress  SKIN:  Multiple pustules noted on right buttocks; noted a single suture on right scalp EYES: conjunctivae normal, sclerae normal, normal eye lids, looks to her right NECK: supple, trachea midline, no neck  masses, no thyroid tenderness, no thyromegaly LYMPHATICS: no LAN in the neck, no supraclavicular LAN RESPIRATORY: breathing is even & unlabored, BS CTAB CARDIAC: RRR, no murmur,no extra heart sounds, no edema GI: abdomen soft, normal BS, no masses, no tenderness, no hepatomegaly, no splenomegaly EXTREMITIES: able to move X 4 extrmities PSYCHIATRIC: the patient is alert & oriented to person, affect & behavior appropriate   LABS/RADIOLOGY: 12/07/14  CO2 19 glucose 150 BUN 8 creatinine 0.6 potassium 3.9 hemoglobin 11.2 hematocrit 33 12/06/14  total protein 7.7 albumin 2.8 total bilirubin 0.8 SGOT 43 ALT 39 lipase 1616 AP 120  ASSESSMENT/PLAN:  SAH, LVA ruptured dissecting pseudoaneurysm - for rehabilitation; continue Lovenox 40 mg subcutaneous daily for DVT prophylaxis Anemia of iron deficiency - hemoglobin 11.0;  S/P transfusion of 1 unit packed RBC;  will monitor Protein calorie malnutrition, severe - albumin 2.8; RD consultation Hypertension - continue metoprolol tartrate 25 mg 2 tabs = 50 mg by mouth every 12 hours; BP/heart rate every shift 1 week Hypothyroidism - continue levothyroxine 50 g 1 tab by mouth daily Prediabetes - CBG daily 1 week Shingles - start Acyclovir 800 mg 1 tab by mouth 5 times/day 7 days   Goals of care:  Short-term rehabilitation   Labs/test ordered:  CMP, CBC, tsh and hgbA1c  Spent 50 minutes in patient care.    [facility], NP [employer] [phone]

## 2014-12-14 LAB — HEPATIC FUNCTION PANEL
ALT: 53 U/L — AB (ref 7–35)
AST: 37 U/L — AB (ref 13–35)
Alkaline Phosphatase: 111 U/L (ref 25–125)
Bilirubin, Total: 0.5 mg/dL

## 2014-12-14 LAB — CBC AND DIFFERENTIAL
HEMATOCRIT: 32 % — AB (ref 36–46)
Hemoglobin: 11.2 g/dL — AB (ref 12.0–16.0)
Platelets: 569 10*3/uL — AB (ref 150–399)
WBC: 10.3 10^3/mL

## 2014-12-14 LAB — BASIC METABOLIC PANEL
BUN: 8 mg/dL (ref 4–21)
CREATININE: 0.7 mg/dL (ref 0.5–1.1)
Glucose: 94 mg/dL
POTASSIUM: 4.4 mmol/L (ref 3.4–5.3)
Sodium: 133 mmol/L — AB (ref 137–147)

## 2014-12-14 LAB — HEMOGLOBIN A1C: Hgb A1c MFr Bld: 5.8 % (ref 4.0–6.0)

## 2014-12-16 ENCOUNTER — Non-Acute Institutional Stay (SKILLED_NURSING_FACILITY): Payer: BC Managed Care – PPO | Admitting: Internal Medicine

## 2014-12-16 ENCOUNTER — Encounter: Payer: Self-pay | Admitting: Internal Medicine

## 2014-12-16 DIAGNOSIS — E46 Unspecified protein-calorie malnutrition: Secondary | ICD-10-CM

## 2014-12-16 DIAGNOSIS — I609 Nontraumatic subarachnoid hemorrhage, unspecified: Secondary | ICD-10-CM

## 2014-12-16 DIAGNOSIS — R5381 Other malaise: Secondary | ICD-10-CM

## 2014-12-16 DIAGNOSIS — B029 Zoster without complications: Secondary | ICD-10-CM

## 2014-12-16 DIAGNOSIS — I10 Essential (primary) hypertension: Secondary | ICD-10-CM

## 2014-12-16 DIAGNOSIS — D509 Iron deficiency anemia, unspecified: Secondary | ICD-10-CM | POA: Diagnosis not present

## 2014-12-16 DIAGNOSIS — R410 Disorientation, unspecified: Secondary | ICD-10-CM

## 2014-12-16 DIAGNOSIS — E039 Hypothyroidism, unspecified: Secondary | ICD-10-CM

## 2014-12-16 NOTE — Progress Notes (Signed)
Patient ID: Katrina Donaldson, female   DOB: Aug 09, 1955, 59 y.o.   MRN: [id number]     [place]  PCP: No primary care provider on file.  Code Status: Full Code   Allergies  Allergen Reactions  . Shrimp [Shellfish Allergy]     Chief Complaint  Patient presents with  . New Admit To SNF    New Admission      HPI:  59 year old patient is here for short term rehabilitation post hospital admission from 11/28/14-12/12/14 at [facility] unresponsive. She was arousable and combative by the time she reached the hospital and was also in respiratory distress. She required intubation. CT scan of the head showed diffuse SAH mainly in the posterior fossa. The ventricles were small with sulcal crowding consistent with cerebral edema. She was on coumadin and required kccentra and iv vitamin k for anticoagulation reversal. 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 She had EVD placement showing increased intracranial pressure. She required csf drainage. She then underwent cerebral angiography showing LVA dissecting pseudoaneurysm. She underwent embolization of LVA aneurysm and coiling. She was started on aspirin. She received 2 u prbc in hospital and was put on lovenox for her dvt.  She is seen in her room today. She is oriented to person, place and time but has flights of thoughts and some of her conversation is not making sense.  As per staff she has been removing her undergarments, commenting that people are after her body, tells me she does not want to wear undergarments as they are touching her at wrong places.   Review of Systems:  Constitutional: Negative for fever, chills, diaphoresis.  HENT: Negative for headache, congestion, sore throat, difficulty swallowing.   Eyes: Negative for eye pain, blurred vision, double vision and discharge.  Respiratory: Negative for cough, shortness of breath and wheezing.   Cardiovascular: Negative for chest pain, palpitations, leg swelling.    Gastrointestinal: Negative for heartburn, nausea, vomiting, abdominal pain Genitourinary: Negative for dysuria Musculoskeletal: Negative for back pain, falls Skin: Negative for itching, rash.  Neurological: Negative for dizziness Psychiatric/Behavioral: Negative for depression.    Past Medical History  Diagnosis Date  . Hypothyroidism   . History of DVT (deep vein thrombosis)   . Hypercoagulable state    Past Surgical History  Procedure Laterality Date  . Abdominal hysterectomy    . Thyroidectomy     Social History:   reports that she has never smoked. She does not have any smokeless tobacco history on file. She reports that she does not drink alcohol or use illicit drugs.  No family history on file.  Medications: Patient's Medications  New Prescriptions   No medications on file  Previous Medications   ACETAMINOPHEN (TYLENOL) 325 MG TABLET    Take 650 mg by mouth every 4 (four) hours as needed.   ACETAMINOPHEN (TYLENOL) 650 MG SUPPOSITORY    Place 650 mg rectally every 4 (four) hours as needed.   ACYCLOVIR (ZOVIRAX) 800 MG TABLET    Take 800 mg by mouth 5 (five) times daily. X 7 days as of 12/13/14   ALBUTEROL (PROVENTIL) (2.5 MG/3ML) 0.083% NEBULIZER SOLUTION    Take 2.5 mg by nebulization every 4 (four) hours as needed for wheezing or shortness of breath.   ASPIRIN EC 81 MG TABLET    Take 81 mg by mouth daily.   ENOXAPARIN (LOVENOX) 40 MG/0.4ML INJECTION    Inject 40 mg into the skin daily.   FOLIC ACID (FOLVITE)  1 MG TABLET    Take 1 mg by mouth daily.   LANSOPRAZOLE (PREVACID SOLUTAB) 30 MG DISINTEGRATING TABLET    Take 30 mg by mouth daily.   LEVOTHYROXINE (SYNTHROID, LEVOTHROID) 50 MCG TABLET    Take 50 mcg by mouth daily before breakfast.   METOPROLOL TARTRATE (LOPRESSOR) 25 MG TABLET    Take 25 mg by mouth 2 (two) times daily. Take 2 tabs of 25 mg = 50 mg PO Q 12 hours   MULTIPLE VITAMINS-MINERALS (MULTIVITAMIN ADULT PO)    Take 1 tablet by mouth daily.   PROTEIN  (PROCEL) POWD    Take 2 scoop by mouth 2 (two) times daily. For Nutritional Support   THIAMINE MONONITRATE 100 MG TABS    Take 1 tablet by mouth daily.  Modified Medications   No medications on file  Discontinued Medications   No medications on file     Physical Exam: Filed Vitals:   12/16/14 1016  BP: 101/61  Pulse: 95  Temp: 98.7 F (37.1 C)  TempSrc: Oral  Resp: 16  Weight: 210 lb (95.255 kg)  SpO2: 94%    General- 59 elderly female, in no acute distress Head- normocephalic, atraumatic Nose- no maxillary or frontal sinus tenderness, no nasal discharge Throat- moist mucus membrane Neck- no cervical lymphadenopathy, no jugular vein distension Cardiovascular- normal s1,s2, no murmurs, palpable dorsalis pedis and radial pulses,, no leg edema Respiratory- bilateral clear to auscultation, no wheeze, no rhonchi, no crackles, no use of accessory muscles Abdomen- bowel sounds present, soft, non tender Musculoskeletal- able to move all 4 extremities, generalized weakness Neurological- alert and oriented to person only Skin- warm and dry, buttock area has few active and few crusting pustules, no signs of infection   Labs reviewed: Basic Metabolic Panel:  Recent Labs  12/14/14  NA 133*  K 4.4  BUN 8  CREATININE 0.7   Liver Function Tests:  Recent Labs  12/14/14  AST 37*  ALT 53*  ALKPHOS 111   No results for input(s): LIPASE, AMYLASE in the last 8760 hours. No results for input(s): AMMONIA in the last 8760 hours. CBC:  Recent Labs  12/14/14  WBC 10.3  HGB 11.2*  HCT 32*  PLT 569*     Assessment/Plan  Generalized weakness Will have her work with physical therapy and occupational therapy team to help with gait training and muscle strengthening exercises.fall precautions. Skin care. Encourage to be out of bed. 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 Will need follow  up with neurosurgery  Shingles Continue and complete a week course of acyclovir 800 mg five times a day, continue skin care and droplet precautions  Intermittent confusion Likely post intracranial bleed. Monitor clinically, close neurochecks, will need follow up with neurosurgery locally. Check cbc with diff, cmp and u/a with c/s to rule out infection. Assistance with ADLs for now  Iron deficiency anemia Likely from the bleed. S/p blood transfusion in hospital. Monitor h&h  HTN Stable bp, continue metoprolol tartrate 50 mg bid  Protein calorie malnutrition Monitor po intake, weight, assist with feed if needed, continue procel supplement  Hypothyroidism continue levothyroxine 50 mcg daily   Goals of care: short term rehabilitation   Labs/tests ordered: cbc, cmp, u/a with c/s  Family/ staff Communication: reviewed care plan with patient and nursing supervisor    Blanchie Serve, MD  [facility] [phone] (Monday-Friday 8 am - 5 pm) [phone] (afterhours)

## 2014-12-21 DIAGNOSIS — E039 Hypothyroidism, unspecified: Secondary | ICD-10-CM | POA: Insufficient documentation

## 2014-12-21 DIAGNOSIS — E46 Unspecified protein-calorie malnutrition: Secondary | ICD-10-CM | POA: Insufficient documentation

## 2014-12-30 ENCOUNTER — Non-Acute Institutional Stay (SKILLED_NURSING_FACILITY): Payer: BC Managed Care – PPO | Admitting: Adult Health

## 2014-12-30 ENCOUNTER — Encounter: Payer: Self-pay | Admitting: Adult Health

## 2014-12-30 DIAGNOSIS — E43 Unspecified severe protein-calorie malnutrition: Secondary | ICD-10-CM

## 2014-12-30 DIAGNOSIS — R7309 Other abnormal glucose: Secondary | ICD-10-CM

## 2014-12-30 DIAGNOSIS — R7303 Prediabetes: Secondary | ICD-10-CM

## 2014-12-30 DIAGNOSIS — E039 Hypothyroidism, unspecified: Secondary | ICD-10-CM | POA: Diagnosis not present

## 2014-12-30 DIAGNOSIS — R5381 Other malaise: Secondary | ICD-10-CM

## 2014-12-30 DIAGNOSIS — D509 Iron deficiency anemia, unspecified: Secondary | ICD-10-CM | POA: Diagnosis not present

## 2014-12-30 DIAGNOSIS — I10 Essential (primary) hypertension: Secondary | ICD-10-CM | POA: Diagnosis not present

## 2014-12-30 DIAGNOSIS — I609 Nontraumatic subarachnoid hemorrhage, unspecified: Secondary | ICD-10-CM | POA: Diagnosis not present

## 2014-12-30 DIAGNOSIS — E46 Unspecified protein-calorie malnutrition: Secondary | ICD-10-CM | POA: Diagnosis not present

## 2015-01-02 NOTE — Progress Notes (Signed)
Patient ID: Katrina Donaldson, female   DOB: 1956-02-16, 59 y.o.   MRN: [id number]   12/30/14  Facility:  Nursing Home Location:  [place] Room Number: 703-P LEVEL OF CARE:  SNF (31)   Chief Complaint  Patient presents with  . Discharge Note    SAH, LVA ruptured dissecting pseudoaneurysm, hypertension, hypothyroidism, anemia, prediabetes, neuropathy, allergic rhinitis and protein-calorie malnutrition    HISTORY OF PRESENT ILLNESS:  This is a 59 year old female who has been admitted to [facility] on 12/12/14 from [facility]. She was visiting out of town with her family. She suddenly stopped talking and was unresponsive. There was blood coming from her mouth so her family called 35 and was brought to hospital. In the hospital she was more aroused and was combative. She did not follow any commands and had respiratory distress. She was intubated. CT showed diffuse SAH especially in the posterior fossa. The ventricles were small with sulcal crowding consistent with cerebral edema. She has INR of 2.3 as she was on warfarin for DVT. Her anticoagulation was reversed. Repeat CT did not reveal any definitive recurrent hemorrhage but the temporal horns of the lateral ventricles , third ventricle, aqueduct of Sylvius, and fourth ventricle were larger. 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 She had EVD placement showing increased intracranial pressure. She had CSF drainage. She then had cerebral angiography showing LVA dissecting pseudoaneurysm. She then had embolization of LVA aneurysm and coiling. She had transfusion of 2 units PRBC.  Patient was admitted to this facility for short-term rehabilitation after the patient's recent hospitalization.  Patient has completed SNF rehabilitation and therapy has cleared the patient for discharge.  PAST MEDICAL HISTORY:   Hypertension, hypothyroidism, anemia and prediabetes   CURRENT MEDICATIONS: Reviewed per MAR/see  medication list  Allergies  Allergen Reactions  . Shrimp [Shellfish Allergy]     REVIEW OF SYSTEMS: unable to obtain due to confusion  PHYSICAL EXAMINATION  GENERAL: no acute distress  EYES: conjunctivae normal, sclerae normal, normal eye lids, looks to her right NECK: supple, trachea midline, no neck masses, no thyroid tenderness, no thyromegaly LYMPHATICS: no LAN in the neck, no supraclavicular LAN RESPIRATORY: breathing is even & unlabored, BS CTAB CARDIAC: RRR, no murmur,no extra heart sounds, no edema GI: abdomen soft, normal BS, no masses, no tenderness, no hepatomegaly, no splenomegaly EXTREMITIES: able to move X 4 extrmities PSYCHIATRIC: the patient is alert & oriented to person, affect & behavior appropriate   LABS/RADIOLOGY: Labs reviewed: 12/14/14  WBC 10.3 hemoglobin 11.2 hematocrit 32.3 MCV 86.1 platelet 569 sodium 133 potassium 4.4 glucose 94 BUN 8 creatinine 0.67 total bilirubin 0.5 alkaline phosphatase 111 SGOT 37 SGPT 53 total protein 7.4 albumin 2.9 calcium 8.4 TSH 0.123 hemoglobin A1c 5.8 12/07/14  CO2 19 glucose 150 BUN 8 creatinine 0.6 potassium 3.9 hemoglobin 11.2 hematocrit 33 12/06/14  total protein 7.7 albumin 2.8 total bilirubin 0.8 SGOT 43 ALT 39 lipase 1616 AP 120  ASSESSMENT/PLAN:  SAH, LVA ruptured dissecting pseudoaneurysm S/P embolization and coiling - for Home health PT and OT; discontinue Lovenox upon discharge since patient is fully ambulatory; follow-up with neuro-surgeon Anemia of iron deficiency - hemoglobin 11.2;  S/P transfusion of 2 unit packed RBC;  stable Protein calorie malnutrition, severe - albumin 2.8; RD continue supplementation Hypertension - continue metoprolol tartrate 25 mg 2 tabs = 50 mg by mouth every 12 hours Hypothyroidism - tsh 0.123 decrease levothyroxine 25 g 1 tab by mouth daily and tsh in 1  month Prediabetes - hgbA1c 5.8; diet-controlled Neuropathy - recently started on Neurontin 100 mg PO daily Allergic rhinitis -  recently started on Flonase 50 mcg/ACT 1 nasal spray into each nostril daily   I have filled out patient's discharge paperwork and written prescriptions.  Patient will receive home health PT and OT.     DME provided:  3-in-1 bedside commode  Total discharge time: Greater than 30 minutes  Discharge time involved coordination of the discharge process with social worker, nursing staff and therapy department. Medical justification for home health services/DME verified.   [facility], NP [employer] [phone]

## 2015-01-26 ENCOUNTER — Other Ambulatory Visit: Payer: Self-pay | Admitting: Adult Health

## 2015-02-26 ENCOUNTER — Other Ambulatory Visit: Payer: Self-pay | Admitting: Adult Health

## 2015-03-10 ENCOUNTER — Other Ambulatory Visit: Payer: Self-pay | Admitting: Adult Health

## 2015-03-14 ENCOUNTER — Other Ambulatory Visit: Payer: Self-pay | Admitting: Adult Health

## 2015-05-16 ENCOUNTER — Other Ambulatory Visit: Payer: Self-pay | Admitting: Neurosurgery

## 2015-05-16 ENCOUNTER — Other Ambulatory Visit (HOSPITAL_COMMUNITY): Payer: Self-pay | Admitting: Neurosurgery

## 2015-05-16 DIAGNOSIS — I609 Nontraumatic subarachnoid hemorrhage, unspecified: Secondary | ICD-10-CM

## 2015-06-09 ENCOUNTER — Ambulatory Visit (HOSPITAL_COMMUNITY): Admission: RE | Admit: 2015-06-09 | Payer: Self-pay | Source: Ambulatory Visit

## 2015-06-12 ENCOUNTER — Other Ambulatory Visit: Payer: Self-pay | Admitting: Neurosurgery

## 2015-07-21 ENCOUNTER — Other Ambulatory Visit (HOSPITAL_COMMUNITY): Payer: Self-pay | Admitting: Neurosurgery

## 2015-07-21 ENCOUNTER — Ambulatory Visit (HOSPITAL_COMMUNITY)
Admission: RE | Admit: 2015-07-21 | Discharge: 2015-07-21 | Disposition: A | Discharge status: Home / Self Care | Payer: BC Managed Care – PPO | Source: Ambulatory Visit | Attending: Neurosurgery | Admitting: Neurosurgery

## 2015-07-21 DIAGNOSIS — Z86718 Personal history of other venous thrombosis and embolism: Secondary | ICD-10-CM | POA: Diagnosis not present

## 2015-07-21 DIAGNOSIS — I609 Nontraumatic subarachnoid hemorrhage, unspecified: Secondary | ICD-10-CM

## 2015-07-21 DIAGNOSIS — I6502 Occlusion and stenosis of left vertebral artery: Secondary | ICD-10-CM | POA: Diagnosis not present

## 2015-07-21 DIAGNOSIS — Z91013 Allergy to seafood: Secondary | ICD-10-CM | POA: Diagnosis not present

## 2015-07-21 DIAGNOSIS — E039 Hypothyroidism, unspecified: Secondary | ICD-10-CM | POA: Insufficient documentation

## 2015-07-21 DIAGNOSIS — Z48812 Encounter for surgical aftercare following surgery on the circulatory system: Secondary | ICD-10-CM | POA: Insufficient documentation

## 2015-07-21 DIAGNOSIS — Z7982 Long term (current) use of aspirin: Secondary | ICD-10-CM | POA: Insufficient documentation

## 2015-07-21 DIAGNOSIS — I10 Essential (primary) hypertension: Secondary | ICD-10-CM | POA: Diagnosis not present

## 2015-07-21 DIAGNOSIS — Z823 Family history of stroke: Secondary | ICD-10-CM | POA: Diagnosis not present

## 2015-07-21 DIAGNOSIS — D6859 Other primary thrombophilia: Secondary | ICD-10-CM | POA: Diagnosis not present

## 2015-07-21 LAB — CBC WITH DIFFERENTIAL/PLATELET
BASOS ABS: 0 10*3/uL (ref 0.0–0.1)
Basophils Relative: 0 %
Eosinophils Absolute: 0.1 10*3/uL (ref 0.0–0.7)
Eosinophils Relative: 1 %
HEMATOCRIT: 41.4 % (ref 36.0–46.0)
HEMOGLOBIN: 13.6 g/dL (ref 12.0–15.0)
LYMPHS PCT: 18 %
Lymphs Abs: 1 10*3/uL (ref 0.7–4.0)
MCH: 28.8 pg (ref 26.0–34.0)
MCHC: 32.9 g/dL (ref 30.0–36.0)
MCV: 87.7 fL (ref 78.0–100.0)
MONO ABS: 0.7 10*3/uL (ref 0.1–1.0)
MONOS PCT: 12 %
NEUTROS ABS: 3.7 10*3/uL (ref 1.7–7.7)
NEUTROS PCT: 69 %
Platelets: 183 10*3/uL (ref 150–400)
RBC: 4.72 MIL/uL (ref 3.87–5.11)
RDW: 14.3 % (ref 11.5–15.5)
WBC: 5.4 10*3/uL (ref 4.0–10.5)

## 2015-07-21 LAB — BASIC METABOLIC PANEL
ANION GAP: 11 (ref 5–15)
BUN: 11 mg/dL (ref 6–20)
CO2: 26 mmol/L (ref 22–32)
Calcium: 9 mg/dL (ref 8.9–10.3)
Chloride: 104 mmol/L (ref 101–111)
Creatinine, Ser: 0.93 mg/dL (ref 0.44–1.00)
GLUCOSE: 98 mg/dL (ref 65–99)
POTASSIUM: 4.7 mmol/L (ref 3.5–5.1)
Sodium: 141 mmol/L (ref 135–145)

## 2015-07-21 LAB — PROTIME-INR
INR: 1.09 (ref 0.00–1.49)
Prothrombin Time: 14.3 seconds (ref 11.6–15.2)

## 2015-07-21 LAB — URINALYSIS, ROUTINE W REFLEX MICROSCOPIC
Bilirubin Urine: NEGATIVE
Glucose, UA: NEGATIVE mg/dL
HGB URINE DIPSTICK: NEGATIVE
Ketones, ur: NEGATIVE mg/dL
LEUKOCYTES UA: NEGATIVE
Nitrite: NEGATIVE
PROTEIN: NEGATIVE mg/dL
SPECIFIC GRAVITY, URINE: 1.017 (ref 1.005–1.030)
pH: 6.5 (ref 5.0–8.0)

## 2015-07-21 LAB — APTT: aPTT: 28 seconds (ref 24–37)

## 2015-07-21 MED ORDER — SODIUM CHLORIDE 0.9 % IV SOLN: INTRAVENOUS | Status: DC

## 2015-07-21 MED ORDER — FENTANYL CITRATE (PF) 100 MCG/2ML IJ SOLN
INTRAMUSCULAR | Status: AC | PRN
  Administered 2015-07-21: 25 ug via INTRAVENOUS

## 2015-07-21 MED ORDER — MIDAZOLAM HCL 2 MG/2ML IJ SOLN
INTRAMUSCULAR | Status: AC | PRN
  Administered 2015-07-21: 1 mg via INTRAVENOUS

## 2015-07-21 MED ORDER — HYDROCODONE-ACETAMINOPHEN 5-325 MG PO TABS: 1.0000 | ORAL_TABLET | ORAL | Status: DC | PRN

## 2015-07-21 MED ORDER — LIDOCAINE HCL 1 % IJ SOLN
INTRAMUSCULAR | Status: AC
  Filled 2015-07-21: qty 20

## 2015-07-21 MED ORDER — HEPARIN SOD (PORK) LOCK FLUSH 100 UNIT/ML IV SOLN
INTRAVENOUS | Status: AC
  Filled 2015-07-21: qty 20

## 2015-07-21 MED ORDER — HEPARIN SOD (PORK) LOCK FLUSH 100 UNIT/ML IV SOLN
INTRAVENOUS | Status: AC | PRN
  Administered 2015-07-21: 2000 [IU] via INTRAVENOUS

## 2015-07-21 MED ORDER — IOHEXOL 300 MG/ML  SOLN
100.0000 mL | Freq: Once | INTRAMUSCULAR | Status: AC | PRN
  Administered 2015-07-21: 60 mL via INTRAVENOUS

## 2015-07-21 MED ORDER — FENTANYL CITRATE (PF) 100 MCG/2ML IJ SOLN
INTRAMUSCULAR | Status: AC
  Filled 2015-07-21: qty 4

## 2015-07-21 MED ORDER — SODIUM CHLORIDE 0.9 % IV SOLN
INTRAVENOUS | Status: AC | PRN
  Administered 2015-07-21: 10 mL/h via INTRAVENOUS

## 2015-07-21 MED ORDER — MIDAZOLAM HCL 2 MG/2ML IJ SOLN
INTRAMUSCULAR | Status: AC
  Filled 2015-07-21: qty 4

## 2015-07-21 NOTE — Progress Notes (Signed)
Patients tongue "feels funny" on the side when she eats shellfish.  Dr Kathyrn Sheriff advised.  No further orders

## 2015-07-21 NOTE — Sedation Documentation (Signed)
Patient is resting comfortably. 

## 2015-07-21 NOTE — Sedation Documentation (Signed)
Patient denies pain and is resting comfortably.  

## 2015-07-21 NOTE — H&P (Signed)
CC:  Aneurysms  HPI: Katrina Donaldson is a 60 y.o. female I'm seeing for follow-up after she was on vacation at the end of May and into June in [place], when she suffered a subarachnoid hemorrhage. Per review of outside medical records, she initially presented as a Hunt Hess grade 5, Fisher 3. She was found to have a ruptured left vertebral artery dissecting pseudoaneurysm which was treated with coil sacrifice of the distal left vertebral artery. She also did require ventriculostomy. During her hospital stay, she made a remarkable recovery, was discharged to rehabilitation, and ultimately back home. She comes in today stating that she feels very well, and is essentially back to normal. She has no significant headaches, visual changes, numbness tingling or weakness.   Of note, the patient does have a history of hypertension, although she says this is controlled with medications. She has a very strong family history of intracranial aneurysms in her mother, 3 of her aunts and at least one or 2 uncles, as well as multiple cousins.   PMH: Past Medical History  Diagnosis Date  . Hypothyroidism   . History of DVT (deep vein thrombosis)   . Hypercoagulable state     PSH: Past Surgical History  Procedure Laterality Date  . Abdominal hysterectomy    . Thyroidectomy      SH: Social History  Substance Use Topics  . Smoking status: Never Smoker   . Smokeless tobacco: Not on file  . Alcohol Use: No    MEDS: Prior to Admission medications   Medication Sig Start Date End Date Taking? Authorizing Provider  aspirin EC 81 MG tablet Take 81 mg by mouth daily.   Yes Historical Provider, MD  fluticasone (FLONASE) 50 MCG/ACT nasal spray Place 1 spray into both nostrils daily.   Yes Historical Provider, MD  levothyroxine (SYNTHROID, LEVOTHROID) 25 MCG tablet Take 25 mcg by mouth daily before breakfast.   Yes Historical Provider, MD  metoprolol (LOPRESSOR) 50 MG tablet Take 50 mg by mouth 2  (two) times daily.   Yes Historical Provider, MD  omeprazole (PRILOSEC) 20 MG capsule Take 20 mg by mouth daily.   Yes Historical Provider, MD  PARoxetine (PAXIL) 20 MG tablet Take 20 mg by mouth daily.   Yes Historical Provider, MD  sodium chloride (MURO 128) 5 % ophthalmic solution Place 1 drop into both eyes daily as needed for irritation.   Yes Historical Provider, MD  thiamine 250 MG tablet Take 125 mg by mouth daily.   Yes Historical Provider, MD  acetaminophen (TYLENOL) 325 MG tablet Take 650 mg by mouth every 4 (four) hours as needed.    Historical Provider, MD  acetaminophen (TYLENOL) 650 MG suppository Place 650 mg rectally every 4 (four) hours as needed.    Historical Provider, MD  acyclovir (ZOVIRAX) 800 MG tablet Take 800 mg by mouth 5 (five) times daily. X 7 days as of 12/13/14    Historical Provider, MD  albuterol (PROVENTIL) (2.5 MG/3ML) 0.083% nebulizer solution Take 2.5 mg by nebulization every 4 (four) hours as needed for wheezing or shortness of breath.    Historical Provider, MD  enoxaparin (LOVENOX) 40 MG/0.4ML injection Inject 40 mg into the skin daily.    Historical Provider, MD  folic acid (FOLVITE) 1 MG tablet Take 1 mg by mouth daily.    Historical Provider, MD  lansoprazole (PREVACID SOLUTAB) 30 MG disintegrating tablet Take 30 mg by mouth daily.    Historical Provider, MD  levothyroxine (SYNTHROID, LEVOTHROID) 50  MCG tablet Take 50 mcg by mouth daily before breakfast.    Historical Provider, MD  metoprolol tartrate (LOPRESSOR) 25 MG tablet Take 25 mg by mouth 2 (two) times daily. Take 2 tabs of 25 mg = 50 mg PO Q 12 hours    Historical Provider, MD  Multiple Vitamins-Minerals (MULTIVITAMIN ADULT PO) Take 1 tablet by mouth daily.    Historical Provider, MD  Protein (PROCEL) POWD Take 2 scoop by mouth 2 (two) times daily. For Nutritional Support    Historical Provider, MD  Thiamine Mononitrate 100 MG TABS Take 1 tablet by mouth daily.    Historical Provider, MD     ALLERGY: Allergies  Allergen Reactions  . Shrimp [Shellfish Allergy]     ROS: ROS  NEUROLOGIC EXAM: Awake, alert, oriented Memory and concentration grossly intact Speech fluent, appropriate CN grossly intact Motor exam: Upper Extremities Deltoid Bicep Tricep Grip  Right 5/5 5/5 5/5 5/5  Left 5/5 5/5 5/5 5/5   Lower Extremity IP Quad PF DF EHL  Right 5/5 5/5 5/5 5/5 5/5  Left 5/5 5/5 5/5 5/5 5/5   Sensation grossly intact to LT  [facility]: Outside diagnostic cerebral angiogram was reviewed. This demonstrates occlusion of the left vertebral artery in the V4 segment. There is filling of the distal V4 segment from the contralateral vertebral artery across the vertebrobasilar junction. 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  IMPRESSION: 60 year old woman 2 months status post subarachnoid hemorrhage and coil sacrifice of a dissecting left vertebral artery pseudoaneurysm. She has made an excellent recovery.   PLAN: We will plan on follow-up diagnostic cerebral angiogram  We have discussed the rationale, risks, benefits, and alternatives to angiogram with the patient in the office. All questions were answered.

## 2015-07-21 NOTE — Discharge Instructions (Signed)

## 2015-07-21 NOTE — Op Note (Signed)
DIAGNOSTIC CEREBRAL ANGIOGRAM    OPERATOR:   Dr. Consuella Lose, MD  HISTORY:   The patient is a 60 y.o. yo female with a history of SAH and left vertebral artery sacrifice at another institution in May of last year. She presents today for angiographic f/u.  APPROACH:   The technical aspects of the procedure as well as its potential risks and benefits were reviewed with the patient. These risks included but were not limited bleeding, infection, allergic reaction, damage to organs/vital structures, stroke, non-diagnostic procedure, and the catastrophic outcomes of heart attack, coma, and death. With an understanding of these risks, informed consent was obtained and witnessed.    The patient was placed in the supine position on the angiography table and the skin of right groin prepped in the usual sterile fashion. The procedure was performed under local anesthesia (1%-solution of bicarbonate-bufferred Lidoacaine) and conscious sedation with Versed and fentanyl monitored by the in-suite nurse.    A 5- French sheath was introduced in the right common femoral artery using Seldinger technique.  A fluorophase sequence was used to document the sheath position.    HEPARIN: 2000 Units total.   CONTRAST AGENT: 80cc, Omnipaque 300   FLUOROSCOPY TIME: See IR records    CATHETER(S) AND WIRE(S):    5-French JB-1 glidecatheter   0.035" glidewire    VESSELS CATHETERIZED:   Right internal carotid   Left common carotid   Right subclavian Left vertebral   Right common femoral  VESSELS STUDIED:   Right internal carotid, head Right vertebral Left common carotid, head Left vertebral Right femoral  PROCEDURAL NARRATIVE:   A 5-Fr JB-1 terumo glide catheter was advanced over a 0.035 glidewire into the aortic arch. The above vessels were then sequentially catheterized and cervical/cerebral angiograms taken. After review of images, the catheter was removed without incident.    INTERPRETATION:   Right  internal carotid: head:   Injection reveals the presence of a widely patent ICA, M1, and A1 segments and their branches. Note is made of a fetal-type PCA. There is no significant stenosis, occlusion, aneurysm or high flow vascular malformation visualized.  The parenchymal and venous phases are normal. The venous sinuses are widely patent.    Left common carotid: head:   Injection reveals the presence of a widely patent ICA, A1, and M1 segments and their branches. Note is made of a fetal-type PCA.  There is no significant stenosis, occlusion, aneurysm, or high flow vascular malformation visualized. The parenchymal and venous phases are normal. The venous sinuses are widely patent.    Right vertebral:   Injection reveals the presence of a widely patent vertebral artery. This leads to a widely patent basilar artery that terminates in bilateral SCA, while the PCAs are likely hypoplastic and not well visualized. The basilar apex is normal. The left PICA is seen filling from this injection. There is no significant stenosis, occlusion, aneurysm, or vascular malformation visualized. The parenchymal and venous phases are normal, including the left inferior cerebellum. The venous sinuses are widely patent.    Right vertebral:    The right vertebral artery terminates at the level of  C3-4. No filling of the distal V2 or intracranial segments is seen. There is coil mass seen distal to the occlusion.  Right femoral:    Normal vessel. No significant atherosclerotic disease. Arterial sheath in adequate position.   DISPOSITION:  Upon completion of the study, the femoral sheath was removed and hemostasis obtained using a 5-Fr ExoSeal closure device. Good  proximal and distal lower extremity pulses were documented upon achievement of hemostasis.    The procedure was well tolerated and no early complications were observed.       The patient was transferred back to the holding area to be positioned flat in bed for 3  hours of observation.    IMPRESSION:  1. 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 No aneurysms are identified.  The preliminary results of this procedure were shared with the patient and the patient's family.

## 2015-07-24 ENCOUNTER — Other Ambulatory Visit (HOSPITAL_COMMUNITY): Payer: Self-pay | Admitting: Neurosurgery

## 2015-07-24 DIAGNOSIS — I609 Nontraumatic subarachnoid hemorrhage, unspecified: Secondary | ICD-10-CM

## 2016-05-20 ENCOUNTER — Other Ambulatory Visit (HOSPITAL_COMMUNITY): Payer: Self-pay | Admitting: Endocrinology

## 2016-05-20 DIAGNOSIS — E0590 Thyrotoxicosis, unspecified without thyrotoxic crisis or storm: Secondary | ICD-10-CM

## 2016-05-28 ENCOUNTER — Encounter (HOSPITAL_COMMUNITY)
Admission: RE | Admit: 2016-05-28 | Discharge: 2016-05-28 | Disposition: A | Discharge status: Home / Self Care | Payer: BC Managed Care – PPO | Source: Ambulatory Visit | Attending: Endocrinology | Admitting: Endocrinology

## 2016-05-28 DIAGNOSIS — E0590 Thyrotoxicosis, unspecified without thyrotoxic crisis or storm: Secondary | ICD-10-CM | POA: Insufficient documentation

## 2016-05-28 MED ORDER — SODIUM IODIDE I 131 CAPSULE
7.5000 | Freq: Once | INTRAVENOUS | Status: AC | PRN
  Administered 2016-05-28: 7.5 via ORAL

## 2016-05-29 ENCOUNTER — Encounter (HOSPITAL_COMMUNITY)
Admission: RE | Admit: 2016-05-29 | Discharge: 2016-05-29 | Disposition: A | Discharge status: Home / Self Care | Payer: BC Managed Care – PPO | Source: Ambulatory Visit | Attending: Endocrinology | Admitting: Endocrinology

## 2016-05-29 DIAGNOSIS — E0590 Thyrotoxicosis, unspecified without thyrotoxic crisis or storm: Secondary | ICD-10-CM | POA: Diagnosis present

## 2016-05-29 MED ORDER — SODIUM PERTECHNETATE TC 99M INJECTION
10.0000 | Freq: Once | INTRAVENOUS | Status: AC | PRN
  Administered 2016-05-29: 10 via INTRAVENOUS

## 2016-06-11 ENCOUNTER — Other Ambulatory Visit (HOSPITAL_COMMUNITY): Payer: Self-pay | Admitting: Endocrinology

## 2016-06-11 DIAGNOSIS — E0590 Thyrotoxicosis, unspecified without thyrotoxic crisis or storm: Secondary | ICD-10-CM

## 2016-06-14 ENCOUNTER — Encounter (HOSPITAL_COMMUNITY)
Admission: RE | Admit: 2016-06-14 | Discharge: 2016-06-14 | Disposition: A | Discharge status: Home / Self Care | Payer: BC Managed Care – PPO | Source: Ambulatory Visit | Attending: Endocrinology | Admitting: Endocrinology

## 2016-06-14 DIAGNOSIS — E0590 Thyrotoxicosis, unspecified without thyrotoxic crisis or storm: Secondary | ICD-10-CM | POA: Diagnosis present

## 2016-06-14 MED ORDER — SODIUM IODIDE I 131 CAPSULE
17.5700 | Freq: Once | INTRAVENOUS | Status: AC | PRN
  Administered 2016-06-14: 17.57 via ORAL

## 2016-06-17 ENCOUNTER — Other Ambulatory Visit (HOSPITAL_COMMUNITY): Payer: Self-pay | Admitting: Neurosurgery

## 2016-06-17 DIAGNOSIS — G9520 Unspecified cord compression: Secondary | ICD-10-CM

## 2016-07-09 ENCOUNTER — Other Ambulatory Visit: Payer: Self-pay | Admitting: Neurosurgery

## 2016-07-10 NOTE — Pre-Procedure Instructions (Signed)
Katrina Donaldson  07/10/2016      [place], [address] [place] [address] Phone: [phone] Fax: [phone]    Your procedure is scheduled on January 19  Report to [place] at 0830 A.M.  Call this number if you have problems the morning of surgery:  [phone]   Remember:  Do not eat food or drink liquids after midnight.   Take these medicines the morning of surgery with A SIP OF WATER acetaminophen (TYLENOL) if needed, amLODipine (NORVASC), fluticasone (FLONASE), methocarbamol (ROBAXIN) if needed, metoprolol (LOPRESSOR), omeprazole (PRILOSEC), PARoxetine (PAXIL)   7 days prior to surgery STOP taking any Aspirin, Aleve, Naproxen, Ibuprofen, Motrin, Advil, Goody's, BC's, all herbal medications, fish oil, and all vitamins    Do not wear jewelry, make-up or nail polish.  Do not wear lotions, powders, or perfumes, or deoderant.  Do not shave 48 hours prior to surgery.   Do not bring valuables to the hospital.  [facility] is not responsible for any belongings or valuables.  Contacts, dentures or bridgework may not be worn into surgery.  Leave your suitcase in the car.  After surgery it may be brought to your room.  For patients admitted to the hospital, discharge time will be determined by your treatment team.  Patients discharged the day of surgery will not be allowed to drive home.    Special instructions:   [facility]- Preparing For Surgery  Before surgery, you can play an important role. Because skin is not sterile, your skin needs to be as free of germs as possible. You can reduce the number of germs on your skin by washing with CHG (chlorahexidine gluconate) Soap before surgery.  CHG is an antiseptic cleaner which kills germs and bonds with the skin to continue killing germs even after washing.  Please do not use if you have an allergy to CHG or antibacterial soaps. If  your skin becomes reddened/irritated stop using the CHG.  Do not shave (including legs and underarms) for at least 48 hours prior to first CHG shower. It is OK to shave your face.  Please follow these instructions carefully.   1. Shower the NIGHT BEFORE SURGERY and the MORNING OF SURGERY with CHG.   2. If you chose to wash your hair, wash your hair first as usual with your normal shampoo.  3. After you shampoo, rinse your hair and body thoroughly to remove the shampoo.  4. Use CHG as you would any other liquid soap. You can apply CHG directly to the skin and wash gently with a scrungie or a clean washcloth.   5. Apply the CHG Soap to your body ONLY FROM THE NECK DOWN.  Do not use on open wounds or open sores. Avoid contact with your eyes, ears, mouth and genitals (private parts). Wash genitals (private parts) with your normal soap.  6. Wash thoroughly, paying special attention to the area where your surgery will be performed.  7. Thoroughly rinse your body with warm water from the neck down.  8. DO NOT shower/wash with your normal soap after using and rinsing off the CHG Soap.  9. Pat yourself dry with a CLEAN TOWEL.   10. Wear CLEAN PAJAMAS   11. Place CLEAN SHEETS on your bed the night of your first shower and DO NOT SLEEP WITH PETS.    Day of Surgery: Do not apply any deodorants/lotions. Please wear clean clothes  to the hospital/surgery center.      Please read over the following fact sheets that you were given.

## 2016-07-11 ENCOUNTER — Encounter (HOSPITAL_COMMUNITY): Payer: Self-pay

## 2016-07-11 ENCOUNTER — Encounter (HOSPITAL_COMMUNITY)
Admission: RE | Admit: 2016-07-11 | Discharge: 2016-07-11 | Disposition: A | Discharge status: Home / Self Care | Payer: BC Managed Care – PPO | Source: Ambulatory Visit | Attending: Neurosurgery | Admitting: Neurosurgery

## 2016-07-11 DIAGNOSIS — Z79899 Other long term (current) drug therapy: Secondary | ICD-10-CM | POA: Diagnosis not present

## 2016-07-11 DIAGNOSIS — Z8673 Personal history of transient ischemic attack (TIA), and cerebral infarction without residual deficits: Secondary | ICD-10-CM | POA: Diagnosis not present

## 2016-07-11 DIAGNOSIS — E890 Postprocedural hypothyroidism: Secondary | ICD-10-CM | POA: Diagnosis not present

## 2016-07-11 DIAGNOSIS — Z01812 Encounter for preprocedural laboratory examination: Secondary | ICD-10-CM | POA: Insufficient documentation

## 2016-07-11 DIAGNOSIS — Z86718 Personal history of other venous thrombosis and embolism: Secondary | ICD-10-CM | POA: Diagnosis not present

## 2016-07-11 DIAGNOSIS — G930 Cerebral cysts: Secondary | ICD-10-CM | POA: Diagnosis not present

## 2016-07-11 DIAGNOSIS — I10 Essential (primary) hypertension: Secondary | ICD-10-CM | POA: Insufficient documentation

## 2016-07-11 DIAGNOSIS — Z01818 Encounter for other preprocedural examination: Secondary | ICD-10-CM | POA: Diagnosis present

## 2016-07-11 DIAGNOSIS — Z7982 Long term (current) use of aspirin: Secondary | ICD-10-CM | POA: Insufficient documentation

## 2016-07-11 DIAGNOSIS — R001 Bradycardia, unspecified: Secondary | ICD-10-CM | POA: Insufficient documentation

## 2016-07-11 HISTORY — DX: Cerebral infarction, unspecified: I63.9

## 2016-07-11 HISTORY — DX: Cerebral aneurysm, nonruptured: I67.1

## 2016-07-11 HISTORY — DX: Major depressive disorder, single episode, unspecified: F32.9

## 2016-07-11 HISTORY — DX: Depression, unspecified: F32.A

## 2016-07-11 HISTORY — DX: Essential (primary) hypertension: I10

## 2016-07-11 LAB — BASIC METABOLIC PANEL
ANION GAP: 6 (ref 5–15)
BUN: 11 mg/dL (ref 6–20)
CO2: 25 mmol/L (ref 22–32)
Calcium: 8.8 mg/dL — ABNORMAL LOW (ref 8.9–10.3)
Chloride: 109 mmol/L (ref 101–111)
Creatinine, Ser: 0.8 mg/dL (ref 0.44–1.00)
GFR calc Af Amer: >60 mL/min (ref >60)
GFR calc non Af Amer: >60 mL/min (ref >60)
GLUCOSE: 86 mg/dL (ref 65–99)
POTASSIUM: 3.8 mmol/L (ref 3.5–5.1)
Sodium: 140 mmol/L (ref 135–145)

## 2016-07-11 LAB — CBC
HEMATOCRIT: 40.5 % (ref 36.0–46.0)
HEMOGLOBIN: 12.8 g/dL (ref 12.0–15.0)
MCH: 26.7 pg (ref 26.0–34.0)
MCHC: 31.6 g/dL (ref 30.0–36.0)
MCV: 84.6 fL (ref 78.0–100.0)
Platelets: 234 10*3/uL (ref 150–400)
RBC: 4.79 MIL/uL (ref 3.87–5.11)
RDW: 14.3 % (ref 11.5–15.5)
WBC: 5.3 10*3/uL (ref 4.0–10.5)

## 2016-07-11 LAB — SURGICAL PCR SCREEN
MRSA, PCR: NEGATIVE
Staphylococcus aureus: NEGATIVE

## 2016-07-11 NOTE — Progress Notes (Signed)
PCP - Vivvian Son - [place] Cardiologist - denies  Chest x-ray - not needed EKG - 07/11/16 Stress Test - denies ECHO - 11/28/14 Cardiac Cath - denies Sleep Study - denies   Fasting Blood Sugar - n/a Checks Blood Sugar ___n/a__ times a day  CPAP - n/a  Patient had a stroke in May 2016 in [place], [place] ([place]) - records are in care everywhere.  She had a brain anyursm that she had surgery on - anesthesia records in care everywhere.  Dr Era Bumpers also has records from the stroke.  She currently is not on any blood thinners but does take a baby aspirin and Dr. Era Bumpers has instructed her to stop it 5-7 days prior to surgery.    Release has been signed in case anything needs to be requested  Patient denies shortness of breath, fever, cough and chest pain at PAT appointment   Patient verbalized understanding of instructions that was given to them at the PAT appointment. Patient expressed that there were no further questions.  Patient was also instructed that they will need to review over the PAT instructions again at home before the surgery.

## 2016-07-12 ENCOUNTER — Encounter (HOSPITAL_COMMUNITY): Payer: Self-pay

## 2016-07-12 NOTE — Progress Notes (Addendum)
Anesthesia Chart Review: Patient is a 61 year old female scheduled for laminectomy T4-5, T6-8, T10-L1, fenestration of arachnoid cysts on 07/19/16 by Dr. Kathyrn Sheriff.  History includes non-smoker, goiter s/p partial thyroidectomy '77 with subsequent hypothyroidism, hyperthyroidism (Graves' disease) s/p radioactive iodine therapy 05/2016, hypercoagulable state, RLE DVT, [facility] 11/28/14 s/p embolization of left vertebral artery pseudoaneurysm, HTN, depression, hysterectomy. In May 2016, she was out of town visiting family. On 11/28/14 she presented to [facility] ED via EMS unresponsive with respiratory distress requiring intubation. INR 2.3 (on warfarin then for history of DVT). CT showed SAH. She was transferred to [facility]. Four factor [place] and vitamin K were given. Ventriculostomy drain placed. CTA showed left vertebral artery small dissecting pseudoaneurysm rupture consistent with a pattern of SAH, left MCA M3 small dissecting pseudoaneurysm, right frontal ventriculostomy in adequate position revealing intracranial hypertension. On 11/29/14 she underwent embolization of left intradural vertebral artery pseudoaneurysm (had very small residual antegrade filling after arterial sacrifice; right vertebral artery provides retrograde filling of the distal left vertebral artery into the left posterior cerebellar artery but not to the pseudoaneurysm). She was extubated on day 4. On day 8, she underwent cerebral angiography with vasospasm treatment that revealed LVA dissecting fusiform pseudoaneurysm no residual filling. Multifocal moderate vasospasm improved after the intra-arterial administration of vasodilator. EVD discontinued. BLE venous duplex was negative for DVT. She was discharged to a rehab facility [facility]) on 12/12/14. Follow-up cerebral angiogram recommended in 6 months (see below).  - PCP is Dr. Donald Prose, records requested but are pending.  - Endocrinologist  is Dr. Jacelyn Pi. Will request records.  Meds include amlodipine, aspirin 81 mg, Flonase, Robaxin, Lopressor, Prilosec, Paxil, thiamine. She is no longer on warfarin due to [facility]. Dr. Kathyrn Sheriff has advised that she hold ASA 5-7 days prior to surgery.   BP (!) (P) 148/91   Pulse (P) 66   Temp (P) 36.7 C   Resp (P) 20   Ht (P) 5' 5.5" (1.664 m)   SpO2 (P) 97%   EKG 07/11/16: SB at 59 bpm, minimal voltage criteria for LVH, may be normal variant.  Echo 11/28/14 ([place]; Care Everywhere): SUMMARY: - Procedure information: Image quality was fair. - Left ventricle: Systolic function was normal by visual assessment. Ejection fraction was estimated in the range of 60 % to 65 %. There were no regional wall motion abnormalities. - Right ventricle: Systolic pressure was mildly increased. Estimated peak pressure was 40 mmHg.   BLE venous duplex 12/05/14 ([place]; Care Everywhere): Right Leg:- Deep venous thrombosis:           No Superficial venous thrombosis:    No Deep venous insufficiency:        No Superficial venous insufficiency: No Left Leg:- Deep venous thrombosis:           No Superficial venous thrombosis:    No Deep venous insufficiency:        No Superficial venous insufficiency: No  Diagnostic cerebral angiogram 07/21/15: IMPRESSION:  1. 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 No aneurysms are identified.  Preoperative labs noted.   If any additional records received then I've asked nursing staff to bring to me for review, but based on currently available records I would anticipate that she can proceed as planned if no acute changes. (Update 07/17/16 9:45 AM: Dr. Nancy Fetter and Dr. Chalmers Cater records reviewed. As above, patient  received prednisone followed by radioactive iodine therapy 05/2016 for treatment of hyperthyroidism. She is scheduled for endocrinology follow-up on 08/07/16.)  George Hugh [facility] Short Stay Center/Anesthesiology Phone [phone] 07/12/2016 6:06 PM

## 2016-07-19 ENCOUNTER — Inpatient Hospital Stay (HOSPITAL_COMMUNITY)
Admission: RE | Admit: 2016-07-19 | Discharge: 2016-07-24 | DRG: 029 | Disposition: A | Discharge status: Rehabilitation Facility | Payer: BC Managed Care – PPO | Source: Ambulatory Visit | Attending: Neurosurgery | Admitting: Neurosurgery

## 2016-07-19 ENCOUNTER — Ambulatory Visit (HOSPITAL_COMMUNITY): Payer: BC Managed Care – PPO | Admitting: Certified Registered Nurse Anesthetist

## 2016-07-19 ENCOUNTER — Ambulatory Visit (HOSPITAL_COMMUNITY): Payer: BC Managed Care – PPO

## 2016-07-19 ENCOUNTER — Ambulatory Visit (HOSPITAL_COMMUNITY): Payer: BC Managed Care – PPO | Admitting: Vascular Surgery

## 2016-07-19 ENCOUNTER — Encounter (HOSPITAL_COMMUNITY): Payer: Self-pay | Admitting: *Deleted

## 2016-07-19 ENCOUNTER — Encounter (HOSPITAL_COMMUNITY)
Admission: RE | Disposition: A | Discharge status: Rehabilitation Facility | Payer: Self-pay | Source: Ambulatory Visit | Attending: Neurosurgery

## 2016-07-19 ENCOUNTER — Ambulatory Visit (HOSPITAL_COMMUNITY)
Admission: RE | Admit: 2016-07-19 | Discharge: 2016-07-19 | Disposition: A | Discharge status: Home / Self Care | Payer: BC Managed Care – PPO | Source: Ambulatory Visit | Attending: Neurosurgery | Admitting: Neurosurgery

## 2016-07-19 DIAGNOSIS — K5903 Drug induced constipation: Secondary | ICD-10-CM | POA: Diagnosis not present

## 2016-07-19 DIAGNOSIS — E876 Hypokalemia: Secondary | ICD-10-CM | POA: Diagnosis not present

## 2016-07-19 DIAGNOSIS — Z8679 Personal history of other diseases of the circulatory system: Secondary | ICD-10-CM

## 2016-07-19 DIAGNOSIS — Z91013 Allergy to seafood: Secondary | ICD-10-CM

## 2016-07-19 DIAGNOSIS — M7989 Other specified soft tissue disorders: Secondary | ICD-10-CM | POA: Diagnosis not present

## 2016-07-19 DIAGNOSIS — I10 Essential (primary) hypertension: Secondary | ICD-10-CM | POA: Diagnosis not present

## 2016-07-19 DIAGNOSIS — M25561 Pain in right knee: Secondary | ICD-10-CM | POA: Diagnosis not present

## 2016-07-19 DIAGNOSIS — G8220 Paraplegia, unspecified: Secondary | ICD-10-CM | POA: Diagnosis not present

## 2016-07-19 DIAGNOSIS — G9529 Other cord compression: Secondary | ICD-10-CM | POA: Diagnosis present

## 2016-07-19 DIAGNOSIS — T50995A Adverse effect of other drugs, medicaments and biological substances, initial encounter: Secondary | ICD-10-CM | POA: Diagnosis not present

## 2016-07-19 DIAGNOSIS — F329 Major depressive disorder, single episode, unspecified: Secondary | ICD-10-CM | POA: Diagnosis present

## 2016-07-19 DIAGNOSIS — Y92230 Patient room in hospital as the place of occurrence of the external cause: Secondary | ICD-10-CM | POA: Diagnosis not present

## 2016-07-19 DIAGNOSIS — R269 Unspecified abnormalities of gait and mobility: Secondary | ICD-10-CM | POA: Diagnosis not present

## 2016-07-19 DIAGNOSIS — G919 Hydrocephalus, unspecified: Secondary | ICD-10-CM | POA: Diagnosis not present

## 2016-07-19 DIAGNOSIS — I82412 Acute embolism and thrombosis of left femoral vein: Secondary | ICD-10-CM | POA: Diagnosis not present

## 2016-07-19 DIAGNOSIS — G96198 Other disorders of meninges, not elsewhere classified: Secondary | ICD-10-CM | POA: Diagnosis present

## 2016-07-19 DIAGNOSIS — Z8673 Personal history of transient ischemic attack (TIA), and cerebral infarction without residual deficits: Secondary | ICD-10-CM

## 2016-07-19 DIAGNOSIS — D6859 Other primary thrombophilia: Secondary | ICD-10-CM | POA: Diagnosis present

## 2016-07-19 DIAGNOSIS — I739 Peripheral vascular disease, unspecified: Secondary | ICD-10-CM | POA: Diagnosis present

## 2016-07-19 DIAGNOSIS — G959 Disease of spinal cord, unspecified: Secondary | ICD-10-CM | POA: Diagnosis not present

## 2016-07-19 DIAGNOSIS — G9520 Unspecified cord compression: Secondary | ICD-10-CM

## 2016-07-19 DIAGNOSIS — R29898 Other symptoms and signs involving the musculoskeletal system: Secondary | ICD-10-CM | POA: Diagnosis not present

## 2016-07-19 DIAGNOSIS — D62 Acute posthemorrhagic anemia: Secondary | ICD-10-CM | POA: Diagnosis not present

## 2016-07-19 DIAGNOSIS — G9619 Other disorders of meninges, not elsewhere classified: Secondary | ICD-10-CM | POA: Diagnosis not present

## 2016-07-19 DIAGNOSIS — R630 Anorexia: Secondary | ICD-10-CM | POA: Diagnosis not present

## 2016-07-19 DIAGNOSIS — Z86718 Personal history of other venous thrombosis and embolism: Secondary | ICD-10-CM

## 2016-07-19 DIAGNOSIS — N390 Urinary tract infection, site not specified: Secondary | ICD-10-CM | POA: Diagnosis not present

## 2016-07-19 DIAGNOSIS — Z419 Encounter for procedure for purposes other than remedying health state, unspecified: Secondary | ICD-10-CM

## 2016-07-19 DIAGNOSIS — M4714 Other spondylosis with myelopathy, thoracic region: Secondary | ICD-10-CM

## 2016-07-19 DIAGNOSIS — R339 Retention of urine, unspecified: Secondary | ICD-10-CM | POA: Diagnosis not present

## 2016-07-19 DIAGNOSIS — F32A Depression, unspecified: Secondary | ICD-10-CM

## 2016-07-19 DIAGNOSIS — R1110 Vomiting, unspecified: Secondary | ICD-10-CM

## 2016-07-19 DIAGNOSIS — G8918 Other acute postprocedural pain: Secondary | ICD-10-CM | POA: Diagnosis not present

## 2016-07-19 DIAGNOSIS — E890 Postprocedural hypothyroidism: Secondary | ICD-10-CM | POA: Diagnosis present

## 2016-07-19 DIAGNOSIS — D72829 Elevated white blood cell count, unspecified: Secondary | ICD-10-CM | POA: Diagnosis not present

## 2016-07-19 DIAGNOSIS — R410 Disorientation, unspecified: Secondary | ICD-10-CM | POA: Diagnosis not present

## 2016-07-19 HISTORY — PX: LAMINECTOMY: SHX219

## 2016-07-19 SURGERY — THORACIC LAMINECTOMY FOR TUMOR
Anesthesia: General

## 2016-07-19 MED ORDER — CHLORHEXIDINE GLUCONATE CLOTH 2 % EX PADS: 6.0000 | MEDICATED_PAD | Freq: Once | CUTANEOUS | Status: DC

## 2016-07-19 MED ORDER — METHOCARBAMOL 500 MG PO TABS
500.0000 mg | ORAL_TABLET | Freq: Four times a day (QID) | ORAL | Status: DC | PRN
  Administered 2016-07-19 – 2016-07-23 (×5): 500 mg via ORAL
  Filled 2016-07-19 (×5): qty 1

## 2016-07-19 MED ORDER — DOCUSATE SODIUM 100 MG PO CAPS
100.0000 mg | ORAL_CAPSULE | Freq: Two times a day (BID) | ORAL | Status: DC
  Administered 2016-07-19 – 2016-07-24 (×10): 100 mg via ORAL
  Filled 2016-07-19 (×10): qty 1

## 2016-07-19 MED ORDER — BUPIVACAINE HCL (PF) 0.25 % IJ SOLN
INTRAMUSCULAR | Status: AC
  Filled 2016-07-19: qty 30

## 2016-07-19 MED ORDER — AMLODIPINE BESYLATE 10 MG PO TABS
10.0000 mg | ORAL_TABLET | Freq: Every day | ORAL | Status: DC
  Administered 2016-07-20 – 2016-07-24 (×5): 10 mg via ORAL
  Filled 2016-07-19 (×5): qty 1

## 2016-07-19 MED ORDER — ACETAMINOPHEN 325 MG PO TABS
650.0000 mg | ORAL_TABLET | ORAL | Status: DC | PRN
  Administered 2016-07-20 (×2): 650 mg via ORAL
  Filled 2016-07-19 (×2): qty 2

## 2016-07-19 MED ORDER — ACETAMINOPHEN 650 MG RE SUPP
650.0000 mg | RECTAL | Status: DC | PRN
  Administered 2016-07-21: 650 mg via RECTAL
  Filled 2016-07-19: qty 1

## 2016-07-19 MED ORDER — ONDANSETRON HCL 4 MG/2ML IJ SOLN
4.0000 mg | INTRAMUSCULAR | Status: DC | PRN
  Administered 2016-07-21: 4 mg via INTRAVENOUS
  Filled 2016-07-19 (×2): qty 2

## 2016-07-19 MED ORDER — LIDOCAINE-EPINEPHRINE (PF) 2 %-1:200000 IJ SOLN
INTRAMUSCULAR | Status: AC
  Filled 2016-07-19: qty 20

## 2016-07-19 MED ORDER — HYDROMORPHONE HCL 1 MG/ML IJ SOLN: 0.2500 mg | INTRAMUSCULAR | Status: DC | PRN

## 2016-07-19 MED ORDER — SENNA 8.6 MG PO TABS
1.0000 | ORAL_TABLET | Freq: Two times a day (BID) | ORAL | Status: DC
  Administered 2016-07-20 – 2016-07-24 (×9): 8.6 mg via ORAL
  Filled 2016-07-19 (×9): qty 1

## 2016-07-19 MED ORDER — MEPERIDINE HCL 25 MG/ML IJ SOLN: 6.2500 mg | INTRAMUSCULAR | Status: DC | PRN

## 2016-07-19 MED ORDER — FLUTICASONE PROPIONATE 50 MCG/ACT NA SUSP
1.0000 | Freq: Every day | NASAL | Status: DC
  Administered 2016-07-20 – 2016-07-23 (×3): 1 via NASAL
  Filled 2016-07-19: qty 16

## 2016-07-19 MED ORDER — PHENOL 1.4 % MT LIQD: 1.0000 | OROMUCOSAL | Status: DC | PRN

## 2016-07-19 MED ORDER — HYDROMORPHONE HCL 1 MG/ML IJ SOLN
INTRAMUSCULAR | Status: AC
  Administered 2016-07-19: 0.5 mg via INTRAVENOUS
  Filled 2016-07-19: qty 0.5

## 2016-07-19 MED ORDER — FENTANYL CITRATE (PF) 100 MCG/2ML IJ SOLN
INTRAMUSCULAR | Status: DC | PRN
  Administered 2016-07-19 (×2): 50 ug via INTRAVENOUS
  Administered 2016-07-19 (×3): 100 ug via INTRAVENOUS

## 2016-07-19 MED ORDER — MIDAZOLAM HCL 2 MG/2ML IJ SOLN
INTRAMUSCULAR | Status: AC
  Filled 2016-07-19: qty 2

## 2016-07-19 MED ORDER — SODIUM CHLORIDE 0.9% FLUSH: 3.0000 mL | INTRAVENOUS | Status: DC | PRN

## 2016-07-19 MED ORDER — LACTATED RINGERS IV SOLN
Freq: Once | INTRAVENOUS | Status: AC
  Administered 2016-07-19: 10:00:00 via INTRAVENOUS

## 2016-07-19 MED ORDER — FENTANYL CITRATE (PF) 100 MCG/2ML IJ SOLN
INTRAMUSCULAR | Status: AC
  Filled 2016-07-19: qty 2

## 2016-07-19 MED ORDER — HEMOSTATIC AGENTS (NO CHARGE) OPTIME
TOPICAL | Status: DC | PRN
  Administered 2016-07-19: 1 via TOPICAL

## 2016-07-19 MED ORDER — DEXTROSE 5 % IV SOLN
INTRAVENOUS | Status: DC | PRN
  Administered 2016-07-19: 20 ug/min via INTRAVENOUS

## 2016-07-19 MED ORDER — EPHEDRINE SULFATE-NACL 50-0.9 MG/10ML-% IV SOSY
PREFILLED_SYRINGE | INTRAVENOUS | Status: DC | PRN
  Administered 2016-07-19: 5 mg via INTRAVENOUS

## 2016-07-19 MED ORDER — LACTATED RINGERS IV SOLN
INTRAVENOUS | Status: DC | PRN
  Administered 2016-07-19 (×2): via INTRAVENOUS

## 2016-07-19 MED ORDER — CEFAZOLIN SODIUM-DEXTROSE 2-4 GM/100ML-% IV SOLN
2.0000 g | INTRAVENOUS | Status: AC
  Administered 2016-07-19 (×2): 2 g via INTRAVENOUS
  Filled 2016-07-19: qty 100

## 2016-07-19 MED ORDER — 0.9 % SODIUM CHLORIDE (POUR BTL) OPTIME
TOPICAL | Status: DC | PRN
  Administered 2016-07-19: 1000 mL

## 2016-07-19 MED ORDER — ROCURONIUM BROMIDE 50 MG/5ML IV SOSY
PREFILLED_SYRINGE | INTRAVENOUS | Status: AC
  Filled 2016-07-19: qty 15

## 2016-07-19 MED ORDER — BUPIVACAINE HCL (PF) 0.25 % IJ SOLN
INTRAMUSCULAR | Status: DC | PRN
  Administered 2016-07-19: 9 mL

## 2016-07-19 MED ORDER — SODIUM CHLORIDE 0.9 % IR SOLN
Status: DC | PRN
  Administered 2016-07-19: 500 mL
  Administered 2016-07-19: 13:00:00

## 2016-07-19 MED ORDER — SODIUM CHLORIDE 0.9% FLUSH
3.0000 mL | Freq: Two times a day (BID) | INTRAVENOUS | Status: DC
  Administered 2016-07-19 – 2016-07-24 (×8): 3 mL via INTRAVENOUS

## 2016-07-19 MED ORDER — MENTHOL 3 MG MT LOZG: 1.0000 | LOZENGE | OROMUCOSAL | Status: DC | PRN

## 2016-07-19 MED ORDER — THROMBIN 20000 UNITS EX SOLR
CUTANEOUS | Status: DC | PRN
  Administered 2016-07-19: 13:00:00 via TOPICAL

## 2016-07-19 MED ORDER — BACITRACIN ZINC 500 UNIT/GM EX OINT
TOPICAL_OINTMENT | CUTANEOUS | Status: AC
  Filled 2016-07-19: qty 28.35

## 2016-07-19 MED ORDER — LACTATED RINGERS IV SOLN
INTRAVENOUS | Status: DC | PRN
  Administered 2016-07-19 (×2): via INTRAVENOUS

## 2016-07-19 MED ORDER — THROMBIN 5000 UNITS EX SOLR
CUTANEOUS | Status: AC
  Filled 2016-07-19: qty 5000

## 2016-07-19 MED ORDER — OXYCODONE-ACETAMINOPHEN 5-325 MG PO TABS
1.0000 | ORAL_TABLET | ORAL | Status: DC | PRN
  Administered 2016-07-20 – 2016-07-24 (×6): 2 via ORAL
  Filled 2016-07-19 (×6): qty 2

## 2016-07-19 MED ORDER — PHENYLEPHRINE 40 MCG/ML (10ML) SYRINGE FOR IV PUSH (FOR BLOOD PRESSURE SUPPORT)
PREFILLED_SYRINGE | INTRAVENOUS | Status: DC | PRN
  Administered 2016-07-19: 120 ug via INTRAVENOUS

## 2016-07-19 MED ORDER — SIMETHICONE 80 MG PO CHEW
80.0000 mg | CHEWABLE_TABLET | Freq: Four times a day (QID) | ORAL | Status: DC | PRN
  Administered 2016-07-20: 160 mg via ORAL
  Filled 2016-07-19: qty 2

## 2016-07-19 MED ORDER — PROMETHAZINE HCL 25 MG/ML IJ SOLN: 6.2500 mg | INTRAMUSCULAR | Status: DC | PRN

## 2016-07-19 MED ORDER — CEFAZOLIN SODIUM-DEXTROSE 2-4 GM/100ML-% IV SOLN
2.0000 g | Freq: Three times a day (TID) | INTRAVENOUS | Status: AC
  Administered 2016-07-19 – 2016-07-20 (×2): 2 g via INTRAVENOUS
  Filled 2016-07-19 (×2): qty 100

## 2016-07-19 MED ORDER — CEFAZOLIN SODIUM 1 G IJ SOLR
INTRAMUSCULAR | Status: AC
  Filled 2016-07-19: qty 20

## 2016-07-19 MED ORDER — METOPROLOL TARTRATE 50 MG PO TABS
100.0000 mg | ORAL_TABLET | Freq: Two times a day (BID) | ORAL | Status: DC
Start: 2016-07-19 — End: 2016-07-24
  Administered 2016-07-19 – 2016-07-24 (×9): 100 mg via ORAL
  Filled 2016-07-19 (×9): qty 2

## 2016-07-19 MED ORDER — ONDANSETRON HCL 4 MG/2ML IJ SOLN: 4.0000 mg | Freq: Once | INTRAMUSCULAR | Status: DC | PRN

## 2016-07-19 MED ORDER — PANTOPRAZOLE SODIUM 40 MG PO TBEC: 40.0000 mg | DELAYED_RELEASE_TABLET | Freq: Every day | ORAL | Status: DC

## 2016-07-19 MED ORDER — LABETALOL HCL 5 MG/ML IV SOLN
INTRAVENOUS | Status: DC | PRN
  Administered 2016-07-19 (×2): 5 mg via INTRAVENOUS

## 2016-07-19 MED ORDER — PANTOPRAZOLE SODIUM 40 MG IV SOLR
40.0000 mg | Freq: Every day | INTRAVENOUS | Status: DC
  Administered 2016-07-19: 40 mg via INTRAVENOUS
  Filled 2016-07-19: qty 40

## 2016-07-19 MED ORDER — BISACODYL 10 MG RE SUPP: 10.0000 mg | Freq: Every day | RECTAL | Status: DC | PRN

## 2016-07-19 MED ORDER — VITAMIN B-1 50 MG PO TABS
125.0000 mg | ORAL_TABLET | Freq: Every day | ORAL | Status: DC
  Administered 2016-07-20: 08:00:00 125 mg via ORAL
  Filled 2016-07-19 (×2): qty 1

## 2016-07-19 MED ORDER — ROCURONIUM BROMIDE 100 MG/10ML IV SOLN
INTRAVENOUS | Status: DC | PRN
  Administered 2016-07-19 (×2): 20 mg via INTRAVENOUS
  Administered 2016-07-19: 10 mg via INTRAVENOUS
  Administered 2016-07-19 (×2): 40 mg via INTRAVENOUS
  Administered 2016-07-19: 10 mg via INTRAVENOUS

## 2016-07-19 MED ORDER — METHOCARBAMOL 1000 MG/10ML IJ SOLN
500.0000 mg | Freq: Four times a day (QID) | INTRAVENOUS | Status: DC | PRN
  Filled 2016-07-19: qty 5

## 2016-07-19 MED ORDER — LIDOCAINE-EPINEPHRINE (PF) 2 %-1:200000 IJ SOLN
INTRAMUSCULAR | Status: DC | PRN
  Administered 2016-07-19: 9 mL via INTRADERMAL

## 2016-07-19 MED ORDER — BACITRACIN ZINC 500 UNIT/GM EX OINT
TOPICAL_OINTMENT | CUTANEOUS | Status: DC | PRN
  Administered 2016-07-19: 1 via TOPICAL

## 2016-07-19 MED ORDER — LIDOCAINE 2% (20 MG/ML) 5 ML SYRINGE
INTRAMUSCULAR | Status: DC | PRN
  Administered 2016-07-19: 60 mg via INTRAVENOUS

## 2016-07-19 MED ORDER — SODIUM CHLORIDE 0.9 % IV SOLN
INTRAVENOUS | Status: DC
  Administered 2016-07-19 – 2016-07-21 (×3): via INTRAVENOUS

## 2016-07-19 MED ORDER — FENTANYL CITRATE (PF) 100 MCG/2ML IJ SOLN
INTRAMUSCULAR | Status: AC
Start: 2016-07-19 — End: 2016-07-19
  Filled 2016-07-19: qty 4

## 2016-07-19 MED ORDER — THROMBIN 5000 UNITS EX SOLR
OROMUCOSAL | Status: DC | PRN
  Administered 2016-07-19: 5 mL via TOPICAL
  Administered 2016-07-19: 13:00:00 via TOPICAL

## 2016-07-19 MED ORDER — HYDROMORPHONE HCL 1 MG/ML IJ SOLN
0.5000 mg | INTRAMUSCULAR | Status: DC | PRN
  Administered 2016-07-19: 1 mg via INTRAVENOUS
  Administered 2016-07-19: 0.5 mg via INTRAVENOUS
  Administered 2016-07-21: 1 mg via INTRAVENOUS
  Filled 2016-07-19 (×2): qty 1

## 2016-07-19 MED ORDER — ARTIFICIAL TEARS OP OINT
TOPICAL_OINTMENT | OPHTHALMIC | Status: DC | PRN
  Administered 2016-07-19: 1 via OPHTHALMIC

## 2016-07-19 MED ORDER — PAROXETINE HCL 20 MG PO TABS
20.0000 mg | ORAL_TABLET | Freq: Every day | ORAL | Status: DC
  Administered 2016-07-20 – 2016-07-24 (×5): 20 mg via ORAL
  Filled 2016-07-19 (×5): qty 1

## 2016-07-19 MED ORDER — PROPOFOL 10 MG/ML IV BOLUS
INTRAVENOUS | Status: DC | PRN
Start: 2016-07-19 — End: 2016-07-19
  Administered 2016-07-19: 130 mg via INTRAVENOUS

## 2016-07-19 MED ORDER — SODIUM CHLORIDE 0.9 % IV SOLN: 250.0000 mL | INTRAVENOUS | Status: DC

## 2016-07-19 MED ORDER — THROMBIN 20000 UNITS EX SOLR
CUTANEOUS | Status: AC
  Filled 2016-07-19: qty 20000

## 2016-07-19 SURGICAL SUPPLY — 72 items
BAG DECANTER FOR FLEXI CONT (MISCELLANEOUS) ×4 IMPLANT
BENZOIN TINCTURE PRP APPL 2/3 (GAUZE/BANDAGES/DRESSINGS) ×2 IMPLANT
BLADE SURG 11 STRL SS (BLADE) ×4 IMPLANT
BLADE ULTRA TIP 2M (BLADE) IMPLANT
BUR ACRON 5.0MM COATED (BURR) IMPLANT
BUR MATCHSTICK NEURO 3.0 LAGG (BURR) ×2 IMPLANT
BUR PRECISION FLUTE 5.0 (BURR) ×2 IMPLANT
CANISTER SUCT 3000ML PPV (MISCELLANEOUS) ×2 IMPLANT
CARTRIDGE OIL MAESTRO DRILL (MISCELLANEOUS) ×1 IMPLANT
CLIP TI MEDIUM 6 (CLIP) IMPLANT
COVER MAYO STAND STRL (DRAPES) IMPLANT
DIFFUSER DRILL AIR PNEUMATIC (MISCELLANEOUS) ×2 IMPLANT
DRAPE LAPAROTOMY 100X72 PEDS (DRAPES) IMPLANT
DRAPE LAPAROTOMY 100X72X124 (DRAPES) IMPLANT
DRAPE MICROSCOPE LEICA (MISCELLANEOUS) ×2 IMPLANT
DRAPE POUCH INSTRU U-SHP 10X18 (DRAPES) ×2 IMPLANT
DRSG OPSITE POSTOP 4X6 (GAUZE/BANDAGES/DRESSINGS) ×4 IMPLANT
ELECT REM PT RETURN 9FT ADLT (ELECTROSURGICAL) ×2
ELECTRODE REM PT RTRN 9FT ADLT (ELECTROSURGICAL) ×1 IMPLANT
GAUZE SPONGE 4X4 12PLY STRL (GAUZE/BANDAGES/DRESSINGS) IMPLANT
GAUZE SPONGE 4X4 16PLY XRAY LF (GAUZE/BANDAGES/DRESSINGS) ×2 IMPLANT
GLOVE BIO SURGEON STRL SZ8 (GLOVE) ×2 IMPLANT
GLOVE BIOGEL PI IND STRL 7.5 (GLOVE) ×1 IMPLANT
GLOVE BIOGEL PI INDICATOR 7.5 (GLOVE) ×1
GLOVE ECLIPSE 7.0 STRL STRAW (GLOVE) ×2 IMPLANT
GLOVE EXAM NITRILE LRG STRL (GLOVE) IMPLANT
GLOVE EXAM NITRILE XL STR (GLOVE) IMPLANT
GLOVE EXAM NITRILE XS STR PU (GLOVE) IMPLANT
GLOVE INDICATOR 7.5 STRL GRN (GLOVE) ×4 IMPLANT
GLOVE INDICATOR 8.5 STRL (GLOVE) ×2 IMPLANT
GLOVE SURG SS PI 7.0 STRL IVOR (GLOVE) ×8 IMPLANT
GOWN STRL REUS W/ TWL LRG LVL3 (GOWN DISPOSABLE) ×2 IMPLANT
GOWN STRL REUS W/ TWL XL LVL3 (GOWN DISPOSABLE) ×1 IMPLANT
GOWN STRL REUS W/TWL 2XL LVL3 (GOWN DISPOSABLE) IMPLANT
GOWN STRL REUS W/TWL LRG LVL3 (GOWN DISPOSABLE) ×2
GOWN STRL REUS W/TWL XL LVL3 (GOWN DISPOSABLE) ×1
HEMOSTAT POWDER KIT SURGIFOAM (HEMOSTASIS) ×4 IMPLANT
HEMOSTAT SURGICEL 2X14 (HEMOSTASIS) IMPLANT
KIT BASIN OR (CUSTOM PROCEDURE TRAY) ×2 IMPLANT
KIT ROOM TURNOVER OR (KITS) ×2 IMPLANT
NEEDLE HYPO 22GX1.5 SAFETY (NEEDLE) ×2 IMPLANT
NEEDLE SPNL 18GX3.5 QUINCKE PK (NEEDLE) ×6 IMPLANT
NS IRRIG 1000ML POUR BTL (IV SOLUTION) ×2 IMPLANT
OIL CARTRIDGE MAESTRO DRILL (MISCELLANEOUS) ×2
PACK LAMINECTOMY NEURO (CUSTOM PROCEDURE TRAY) ×2 IMPLANT
PACK UNIVERSAL I (CUSTOM PROCEDURE TRAY) ×2 IMPLANT
PAD ARMBOARD 7.5X6 YLW CONV (MISCELLANEOUS) ×6 IMPLANT
PATTIES SURGICAL .25X.25 (GAUZE/BANDAGES/DRESSINGS) IMPLANT
PATTIES SURGICAL .5 X.5 (GAUZE/BANDAGES/DRESSINGS) ×2 IMPLANT
PATTIES SURGICAL .5 X3 (DISPOSABLE) ×2 IMPLANT
PATTIES SURGICAL 1/4 X 3 (GAUZE/BANDAGES/DRESSINGS) ×2 IMPLANT
RUBBERBAND STERILE (MISCELLANEOUS) ×4 IMPLANT
SEALANT ADHERUS EXTEND TIP (MISCELLANEOUS) ×4 IMPLANT
SPECIMEN JAR SMALL (MISCELLANEOUS) ×2 IMPLANT
SPONGE LAP 4X18 X RAY DECT (DISPOSABLE) IMPLANT
SPONGE NEURO XRAY DETECT 1X3 (DISPOSABLE) IMPLANT
SPONGE SURGIFOAM ABS GEL 100 (HEMOSTASIS) ×2 IMPLANT
STAPLER VISISTAT 35W (STAPLE) ×4 IMPLANT
STRIP CLOSURE SKIN 1/4X4 (GAUZE/BANDAGES/DRESSINGS) ×2 IMPLANT
SUT BONE WAX W31G (SUTURE) ×2 IMPLANT
SUT NURALON 4 0 TF (SUTURE) ×2 IMPLANT
SUT NURALON 4 0 TR CR/8 (SUTURE) ×6 IMPLANT
SUT PROLENE 6 0 BV (SUTURE) IMPLANT
SUT VIC AB 0 CT1 18XCR BRD8 (SUTURE) ×6 IMPLANT
SUT VIC AB 0 CT1 8-18 (SUTURE) ×6
SUT VIC AB 2-0 CP2 18 (SUTURE) ×2 IMPLANT
TIP NONSTICK .5MMX23CM (INSTRUMENTS) ×1
TIP NONSTICK .5X23 (INSTRUMENTS) ×1 IMPLANT
TOWEL OR 17X24 6PK STRL BLUE (TOWEL DISPOSABLE) ×2 IMPLANT
TOWEL OR 17X26 10 PK STRL BLUE (TOWEL DISPOSABLE) ×2 IMPLANT
TRAY FOLEY W/METER SILVER 16FR (SET/KITS/TRAYS/PACK) ×2 IMPLANT
WATER STERILE IRR 1000ML POUR (IV SOLUTION) ×2 IMPLANT

## 2016-07-19 NOTE — H&P (Signed)
CC:  Imbalance, difficulty walking  HPI: Katrina Donaldson is a 61 year old woman I'm seeing in follow-up. Our last visit, we reviewed her spine MRIs which demonstrated multiple loculated arachnoid cysts. I did order repeat spinal imaging with contrast at that time to assess for any secondary cause for large arachnoid cyst such as tumor or other pathology. These MRIs have been completed and did not demonstrate any primary pathology to explain the loculated cysts. She continues to have a significant amount of imbalance and lower extremity weakness. She therefore presents today for fenestration of the cysts.   PMH: Past Medical History:  Diagnosis Date  . Brain aneurysm   . Depression   . History of DVT (deep vein thrombosis)   . Hypercoagulable state ([place])   . Hypertension   . Hypothyroidism   . Stroke [facility]) 2016   [facility] 11/29/14    PSH: Past Surgical History:  Procedure Laterality Date  . ABDOMINAL HYSTERECTOMY     patient denies  . BRAIN SURGERY     aneurysm  . goiter    . THYROIDECTOMY      SH: Social History  Substance Use Topics  . Smoking status: Never Smoker  . Smokeless tobacco: Never Used  . Alcohol use No    MEDS: Prior to Admission medications   Medication Sig Start Date End Date Taking? Authorizing Provider  acetaminophen (TYLENOL) 500 MG tablet Take 500-1,000 mg by mouth every 6 (six) hours as needed (for pain).   Yes Historical Provider, MD  amLODipine (NORVASC) 10 MG tablet Take 10 mg by mouth daily. 04/18/16  Yes Historical Provider, MD  aspirin EC 81 MG tablet Take 81 mg by mouth daily.   Yes Historical Provider, MD  fluticasone (FLONASE) 50 MCG/ACT nasal spray Place 1 spray into both nostrils daily.   Yes Historical Provider, MD  metoprolol (LOPRESSOR) 100 MG tablet Take 100 mg by mouth 2 (two) times daily. 04/18/16  Yes Historical Provider, MD  omeprazole (PRILOSEC) 20 MG capsule Take 20 mg by mouth daily.   Yes Historical Provider, MD  PARoxetine (PAXIL) 20  MG tablet Take 20 mg by mouth daily.   Yes Historical Provider, MD  simethicone (MYLICON) 80 MG chewable tablet Chew 80-160 mg by mouth every 6 (six) hours as needed for flatulence.   Yes Historical Provider, MD  thiamine 250 MG tablet Take 125 mg by mouth daily.   Yes Historical Provider, MD  methocarbamol (ROBAXIN) 750 MG tablet Take 750 mg by mouth 3 (three) times daily as needed. For muscle spasms/back pain. 05/28/16   Historical Provider, MD    ALLERGY: Allergies  Allergen Reactions  . Shrimp [Shellfish Allergy] Other (See Comments)    "tongue feels strange"    ROS: ROS  NEUROLOGIC EXAM: Awake, alert, oriented Memory and concentration grossly intact Speech fluent, appropriate CN grossly intact Motor exam: Upper Extremities Deltoid Bicep Tricep Grip  Right 5/5 5/5 5/5 5/5  Left 5/5 5/5 5/5 5/5   Lower Extremity IP Quad PF DF EHL  Right 4/5 -->      Left 4/5 -->       Sensation grossly intact to LT  IMGAING: MRI of the cervical thoracic and lumbar spine with and without contrast was again reviewed. There are no enhancing lesions to suggest spinal tumor. Again demonstrated is a multiloculated thoracolumbar arachnoid cysts with significant amount of spinal cord compression as a result.   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 There are no enhancing lesions to suggest tumor. I suspect that the arachnoiditis is a result of her previous subarachnoid hemorrhage.  PLAN: We will plan on decompression of the spinal cord by fenestration of the arachnoid cysts. This will require laminectomy at around T4 T5, T6 to T8, and T10 to T12 or L1.   I have reviewed the MRI findings again with the patient and her family. The need for spinal cord decompression was discussed, as this is the reason for her symptoms, and decompression offers the best chance for neurologic recovery. We  discussed alternatives including expectant observation, however I do believe that this will only lead to worsening symptoms. We did also discuss fenestration of the cyst and placement of a subarachnoid pleural shunt, however I would prefer not to place a shunt as a first line treatment, but rather try fenestration. Certainly if there were recurrence of the cyst, we could then consider placement of a diversion tube. The surgery and expected recovery was discussed, including the possible need for therapy after surgery. Risks of surgery were discussed including spinal cord injury leading to worsening weakness numbness paralysis, bleeding, infection, CSF leak, and need for future surgeries. We also discussed the general risks of anesthesia including heart attack, stroke, and DVT/PE. The patient understood our discussion and all questions were answered. She is willing to proceed as above.

## 2016-07-19 NOTE — Op Note (Signed)
PREOP DIAGNOSIS:  1. Spinal arachnoid cyst  POSTOP DIAGNOSIS: Same  PROCEDURE: 1. T11-L1 laminectomy, fenestration of arachnoid cyst 2. T4-T7 laminectomy, fenestration of arachnoid cyst 3. Use of intraoperative microscope for microdissection  SURGEON: Dr. Consuella Lose, MD  ASSISTANT: Dr. Kary Kos, MD  ANESTHESIA: General Endotracheal  EBL: 200cc  SPECIMENS: Arachnoid cyst wall  DRAINS: None  COMPLICATIONS: None immediate  CONDITION: Stable to PCAU  HISTORY: Katrina Donaldson is a 61 y.o. female who initially presented to the outpatient clinic after previous subarachnoid hemorrhage. She began to complain of significant difficulty walking, imbalance, and some falls. MRI of the cervical thoracic and lumbar spine demonstrated a large loculated arachnoid cyst with spinal cord compression extending from the top of the thoracic spine to the upper lumbar spine. With her symptoms and MRI findings, surgical fenestration of the cyst was indicated. The risks and benefits of the surgery were explained in detail to the patient and her family. After all questions were answered informed consent was obtained and witnessed.  PROCEDURE IN DETAIL: After informed consent was obtained and witnessed, the patient was brought to the operating room. After induction of general anesthesia, the patient was positioned on the operative table in the prone position with all pressure points meticulously padded. The skin of the back was then prepped and draped in the usual sterile fashion.  Under fluoroscopy, the T12 level was identified and marked out on the skin, and after timeout was conducted, the skin was infiltrated with local anesthetic. Skin incision was then made sharply and Bovie electrocautery was used to dissect the subcutaneous tissue until the lumbodorsal fascia was identified. The fascia was then incised using Bovie electrocautery and the lamina at T11-L1 levels was identified and dissection was  carried out in the subperiosteal plane. Self-retaining retractor was then placed, and intraoperative x-ray was taken to confirm we were at the correct levels.  Using a high-speed drill, laminectomies were completed from T11-L1. The ligamentum flavum was then identified and removed. Hemostasis was achieved on the epidural surface primarily with morcellized Gelfoam and thrombin. At this point the microscope was draped sterilely and brought into the field, and the remainder of this portion was done under the microscope using microdissection.  Linear dural opening was made with the knife, and tack up stitches were placed with 4 Nurolon. The underlying arachnoid was noted to be significantly thickened, and opaque. The arachnoid was incised sharply. The large dorsally located arachnoid cyst was drained, and portions of the cyst wall were removed. A portion of this arachnoid was also dissected away from the spinal cord which was displaced ventrally.  Having fenestrated the dorsally located cyst at this level, hemostasis was secured. The dura was then closed in a watertight fashion with a running 4-0 Nurolon stitch. Suture line was covered with dural sealant. Self-retaining retractors were then removed, and the wound was closed in multiple layers using a combination of interrupted 0 and 3-0 Vicryl stitches.  Attention was then turned to the upper thoracic levels. Using fluoroscopy, the T5 and T6 levels were identified. Skin incision was then made sharply, and Bovie electrocautery was used to dissect the subcutaneous tissue until the thoracodorsal fascia was identified and incised. Subperiosteal dissection was carried out to identify the lamina at T4, T5, T6, and T7. Self-retaining retractors were then placed. Intraoperative fluoroscopy was used to confirm our location at the correct levels. Complete laminectomy was then carried out from T4-T7 using rongeurs and high-speed drill. The ligamentum flavum was identified  and  removed with Kerrison [place].  Once hemostasis was secured with morcellized Gelfoam and thrombin, the microscope was again brought into the field. Dura was then opened in a linear fashion and tack up stitches were placed. At the lower portion of the dural opening around the area of T6 and T7, the arachnoid cyst appeared to be dorsal lateral on the right side, displacing the spinal cord towards the left. Using microdissection, the arachnoid cyst was fenestrated in multiple locations. Dissection was then carried superiorly, where the arachnoid cyst appeared to be located further laterally to the right, and somewhat ventral to the spinal cord. In a similar fashion, using microdissection, the more superior portion of the arachnoid cyst was fenestrated in multiple locations.  At this point, the wound was irrigated with copious amounts of normal saline irrigation. The dura was again closed in watertight fashion with a running 4 Nurolon stitch, and covered with dural sealant. Self-retaining retractors were then removed, and the wound was again closed in layers with a combination of interrupted 0 and 3-0 Vicryl stitches. Skin was then closed for both incisions with staples. Bacitracin ointment and sterile dressings were then applied. The patient was then transferred to the stretcher, extubated, and taken to the postanesthesia care unit in stable hemodynamic condition. At the end of the case all sponge, needle, instrument, and cottonoid counts were correct.

## 2016-07-19 NOTE — Anesthesia Procedure Notes (Signed)
Procedure Name: Intubation Date/Time: 07/19/2016 11:53 AM Performed by: Trixie Deis A Pre-anesthesia Checklist: Patient identified, Emergency Drugs available, Suction available and Patient being monitored Patient Re-evaluated:Patient Re-evaluated prior to inductionOxygen Delivery Method: Circle System Utilized Preoxygenation: Pre-oxygenation with 100% oxygen Intubation Type: IV induction Ventilation: Mask ventilation without difficulty Laryngoscope Size: Mac and 3 Grade View: Grade I Tube type: Oral Tube size: 7.5 mm Number of attempts: 1 Airway Equipment and Method: Stylet and Oral airway Placement Confirmation: ETT inserted through vocal cords under direct vision,  positive ETCO2 and breath sounds checked- equal and bilateral Secured at: 21 cm Tube secured with: Tape Dental Injury: Teeth and Oropharynx as per pre-operative assessment

## 2016-07-19 NOTE — Anesthesia Preprocedure Evaluation (Addendum)
Anesthesia Evaluation  Patient identified by MRN, date of birth, ID band Patient awake    Reviewed: Allergy & Precautions, NPO status , Patient's Chart, lab work & pertinent test results, reviewed documented beta blocker date and time   Airway Mallampati: I  TM Distance: >3 FB Neck ROM: Full    Dental  (+) Dental Advisory Given   Pulmonary neg pulmonary ROS,    breath sounds clear to auscultation       Cardiovascular hypertension, Pt. on medications and Pt. on home beta blockers + Peripheral Vascular Disease   Rhythm:Regular Rate:Normal     Neuro/Psych Depression CVA    GI/Hepatic negative GI ROS, Neg liver ROS,   Endo/Other  Hypothyroidism   Renal/GU negative Renal ROS     Musculoskeletal   Abdominal   Peds  Hematology negative hematology ROS (+)   Anesthesia Other Findings   Reproductive/Obstetrics                            Lab Results  Component Value Date   WBC 5.3 07/11/2016   HGB 12.8 07/11/2016   HCT 40.5 07/11/2016   MCV 84.6 07/11/2016   PLT 234 07/11/2016   Lab Results  Component Value Date   CREATININE 0.80 07/11/2016   BUN 11 07/11/2016   NA 140 07/11/2016   K 3.8 07/11/2016   CL 109 07/11/2016   CO2 25 07/11/2016    Anesthesia Physical Anesthesia Plan  ASA: III  Anesthesia Plan: General   Post-op Pain Management:    Induction: Intravenous  Airway Management Planned: Oral ETT  Additional Equipment: Arterial line  Intra-op Plan:   Post-operative Plan: Extubation in OR  Informed Consent: I have reviewed the patients History and Physical, chart, labs and discussed the procedure including the risks, benefits and alternatives for the proposed anesthesia with the patient or authorized representative who has indicated his/her understanding and acceptance.   Dental advisory given  Plan Discussed with: CRNA  Anesthesia Plan Comments:         Anesthesia Quick Evaluation

## 2016-07-19 NOTE — Anesthesia Postprocedure Evaluation (Signed)
Anesthesia Post Note  Patient: Vergil Gosch  Procedure(s) Performed: Procedure(s) (LRB): LAMINECTOMY THORACIC FOUR THORACIC FIVE, THORACIC SIX TO THORACIC EIGHT,THORACIC TEN TO LUMBAR ONE, FENESTRATION OF ARACHNOID CYSTS (N/A)  Patient location during evaluation: PACU Anesthesia Type: General Level of consciousness: sedated and patient cooperative Pain management: pain level controlled Vital Signs Assessment: post-procedure vital signs reviewed and stable Respiratory status: spontaneous breathing Cardiovascular status: stable Anesthetic complications: no       Last Vitals:  Vitals:   07/19/16 2000 07/19/16 2025  BP: 116/76 122/78  Pulse: 81 80  Resp: 16 16  Temp: 36.5 C 36.7 C    Last Pain:  Vitals:   07/19/16 2317  TempSrc:   PainSc: [place]

## 2016-07-19 NOTE — Transfer of Care (Signed)
Immediate Anesthesia Transfer of Care Note  Patient: Katrina Donaldson  Procedure(s) Performed: Procedure(s): LAMINECTOMY THORACIC FOUR THORACIC FIVE, THORACIC SIX TO THORACIC EIGHT,THORACIC TEN TO LUMBAR ONE, FENESTRATION OF ARACHNOID CYSTS (N/A)  Patient Location: PACU  Anesthesia Type:General  Level of Consciousness: awake, alert  and oriented  Airway & Oxygen Therapy: Patient Spontanous Breathing and Patient connected to face mask oxygen  Post-op Assessment: Report given to RN, Post -op Vital signs reviewed and stable and Patient moving all extremities  Post vital signs: Reviewed and stable  Last Vitals:  Vitals:   07/19/16 0851 07/19/16 0929  BP: (!) 182/92 (!) 176/94  Pulse: 64   Resp: 18   Temp: 36.7 C     Last Pain:  Vitals:   07/19/16 0851  TempSrc: Oral         Complications: No apparent anesthesia complications

## 2016-07-20 ENCOUNTER — Encounter (HOSPITAL_COMMUNITY): Payer: Self-pay | Admitting: Neurosurgery

## 2016-07-20 MED ORDER — PANTOPRAZOLE SODIUM 40 MG IV SOLR: 40.0000 mg | Freq: Every day | INTRAVENOUS | Status: DC

## 2016-07-20 MED ORDER — PANTOPRAZOLE SODIUM 40 MG PO TBEC
40.0000 mg | DELAYED_RELEASE_TABLET | Freq: Every day | ORAL | Status: DC
  Administered 2016-07-20: 40 mg via ORAL
  Filled 2016-07-20: qty 1

## 2016-07-20 NOTE — Progress Notes (Signed)
Pt admitted from PACU post op back surgery, drowsy but easy to arouse, oriented x4, pt settled in room with call bell and family at bedside, was however reassured, v/s stable, will continue to monitor. [place]-Asidi, Juliene Kirsh Efe

## 2016-07-20 NOTE — Progress Notes (Signed)
Subjective: Patient reports sore in back  Objective: Vital signs in last 24 hours: Temp:  [97.7 F (36.5 C)-99.6 F (37.6 C)] 99.2 F (37.3 C) (01/20 0517) Pulse Rate:  [64-102] 102 (01/20 0517) Resp:  [10-18] 16 (01/20 0517) BP: (102-182)/(67-94) 107/71 (01/20 0517) SpO2:  [95 %-100 %] 95 % (01/20 0517) Arterial Line BP: (123)/(77) 123/77 (01/19 1815) Weight:  [84.4 kg (186 lb)-90.8 kg (200 lb 2.8 oz)] 90.8 kg (200 lb 2.8 oz) (01/19 2025)  Intake/Output from previous day: 01/19 0701 - 01/20 0700 In: 2150 [P.O.:50; I.V.:2000; IV Piggyback:100] Out: 2940 [Urine:2740; Blood:200] Intake/Output this shift: No intake/output data recorded.  Physical Exam: Strength good in lower extremities.  Dressing CDI  Lab Results: No results for input(s): WBC, HGB, HCT, PLT in the last 72 hours. BMET No results for input(s): NA, K, CL, CO2, GLUCOSE, BUN, CREATININE, CALCIUM in the last 72 hours.  Studies/Results: [place] Intraoperative  Result Date: 07/19/2016 CLINICAL DATA:  Ultrasound was provided for use by the ordering physician, and a technical charge was applied by the performing facility.  No radiologist interpretation/professional services rendered.    Assessment/Plan: Doing well.  Mobilize with PT today.    LOS: 1 day    Peggyann Shoals, MD 07/20/2016, 7:06 AM

## 2016-07-21 ENCOUNTER — Inpatient Hospital Stay (HOSPITAL_COMMUNITY): Payer: BC Managed Care – PPO

## 2016-07-21 LAB — CBC
HCT: 35.4 % — ABNORMAL LOW (ref 36.0–46.0)
HEMOGLOBIN: 11.4 g/dL — AB (ref 12.0–15.0)
MCH: 27.5 pg (ref 26.0–34.0)
MCHC: 32.2 g/dL (ref 30.0–36.0)
MCV: 85.3 fL (ref 78.0–100.0)
Platelets: 146 10*3/uL — ABNORMAL LOW (ref 150–400)
RBC: 4.15 MIL/uL (ref 3.87–5.11)
RDW: 15.1 % (ref 11.5–15.5)
WBC: 18.9 10*3/uL — ABNORMAL HIGH (ref 4.0–10.5)

## 2016-07-21 MED ORDER — PANTOPRAZOLE SODIUM 40 MG IV SOLR
40.0000 mg | Freq: Two times a day (BID) | INTRAVENOUS | Status: DC
  Administered 2016-07-21 (×2): 40 mg via INTRAVENOUS
  Filled 2016-07-21 (×3): qty 40

## 2016-07-21 MED ORDER — VITAMIN B-1 100 MG PO TABS
100.0000 mg | ORAL_TABLET | Freq: Every day | ORAL | Status: DC
  Administered 2016-07-21 – 2016-07-24 (×4): 100 mg via ORAL
  Filled 2016-07-21 (×4): qty 1

## 2016-07-21 NOTE — Progress Notes (Signed)
Subjective: Patient reports Patient states her legs feel better back is still very sore no headache  Objective: Vital signs in last 24 hours: Temp:  [98.8 F (37.1 C)-100.6 F (38.1 C)] 99.2 F (37.3 C) (01/21 0940) Pulse Rate:  [88-103] 97 (01/21 0940) Resp:  [18-20] 20 (01/21 0940) BP: (101-154)/(56-89) 154/89 (01/21 0940) SpO2:  [93 %-97 %] 93 % (01/21 0940)  Intake/Output from previous day: 01/20 0701 - 01/21 0700 In: 240 [P.O.:240] Out: 800 [Urine:800] Intake/Output this shift: No intake/output data recorded.  Strength seems to be 4-4+ out of 5 incision clean dry and intact neurologically stable  Lab Results: No results for input(s): WBC, HGB, HCT, PLT in the last 72 hours. BMET No results for input(s): NA, K, CL, CO2, GLUCOSE, BUN, CREATININE, CALCIUM in the last 72 hours.  Studies/Results: [place] Intraoperative  Result Date: 07/19/2016 CLINICAL DATA:  Ultrasound was provided for use by the ordering physician, and a technical charge was applied by the performing facility.  No radiologist interpretation/professional services rendered.    Assessment/Plan: Slow mobilization today we'll slowly raise the head of her bed possibly out of bed this afternoon.  LOS: 2 days     Vashawn Ekstein P 07/21/2016, 11:03 AM

## 2016-07-21 NOTE — Progress Notes (Signed)
Results of labs, xray called in to MD. No new orders at this time.

## 2016-07-21 NOTE — Progress Notes (Signed)
Right hand with noted swelling without redness or pain.  IV fluids stopped, catheter removed.   Clear fluid leaking from insertion site.  Hand elevated.  Will continue to monitor.

## 2016-07-21 NOTE — Progress Notes (Signed)
Patient vomited x'1 brown, "grainy" emesis approximately 100cc.  Vital signs obtained, prn medication for nausea/vomiting given per IV.  Call to on call MD for new orders.

## 2016-07-22 MED ORDER — ADULT MULTIVITAMIN W/MINERALS CH
1.0000 | ORAL_TABLET | Freq: Every day | ORAL | Status: DC
  Administered 2016-07-22 – 2016-07-24 (×3): 1 via ORAL
  Filled 2016-07-22 (×3): qty 1

## 2016-07-22 MED ORDER — PANTOPRAZOLE SODIUM 40 MG PO TBEC
40.0000 mg | DELAYED_RELEASE_TABLET | Freq: Two times a day (BID) | ORAL | Status: DC
  Administered 2016-07-22 – 2016-07-24 (×4): 40 mg via ORAL
  Filled 2016-07-22 (×4): qty 1

## 2016-07-22 MED ORDER — ENSURE ENLIVE PO LIQD
237.0000 mL | Freq: Three times a day (TID) | ORAL | Status: DC
  Administered 2016-07-22 – 2016-07-24 (×5): 237 mL via ORAL
  Filled 2016-07-22 (×9): qty 237

## 2016-07-22 MED FILL — Thrombin For Soln 5000 Unit: CUTANEOUS | Qty: 5000 | Status: AC

## 2016-07-22 NOTE — Progress Notes (Signed)
Initial Nutrition Assessment  DOCUMENTATION CODES:   Obesity unspecified  INTERVENTION:  Ensure Enlive po TID, each supplement provides 350 kcal and 20 grams of protein Multivitamin with minerals daily  NUTRITION DIAGNOSIS:   Inadequate oral intake related to poor appetite, other (see comment) (food preferences) as evidenced by meal completion < 25%, per patient/family report.   GOAL:   Patient will meet greater than or equal to 90% of their needs   MONITOR:   PO intake, Supplement acceptance, Skin, Weight trends, Labs  REASON FOR ASSESSMENT:   Malnutrition Screening Tool    ASSESSMENT:   61 year old female with history of HTN, hypothroidism, and anxiety presents to hospital for T4-L1 laminectomy and fenestration of arachnoid cysts.  Pt states that she was eating well PTA, but has not been eating since admission due to disliking the food here in the hospital. Pt's daughter at bedside states that pt was not allowed to eat Friday due to surgery, Saturday pt slept all day and barely ate, Sunday pt ate some toast and oatmeal but vomited, and today pt ate about 25% of breakfast and a few bites of salad and crackers for lunch. Pt denies any nausea today. RD discussed the importance of healthful PO intake and adequate protein intake s/p surgery. Offered pt a variety of nutrition interventions and pt chose to try Ensure Enlive.   Labs: low hemoglobin  Diet Order:  Diet regular Room service appropriate? Yes; Fluid consistency: Thin  Skin:  Wound (see comment) (closed incision on back)  Last BM:  1/18  Height:   Ht Readings from Last 1 Encounters:  07/19/16 5\' 5"  (1.651 m)    Weight:   Wt Readings from Last 1 Encounters:  07/19/16 200 lb 2.8 oz (90.8 kg)    Ideal Body Weight:  56.8 kg  BMI:  Body mass index is 33.31 kg/m.  Estimated Nutritional Needs:   Kcal:  1600-1800  Protein:  80-95 grams  Fluid:  2.5 L/day  EDUCATION NEEDS:   No education needs  identified at this time  [place], CSP, LDN Inpatient Clinical Dietitian Pager: [phone] After Hours Pager: [phone]

## 2016-07-22 NOTE — Care Management Note (Signed)
Case Management Note  Patient Details  Name: Katrina Donaldson MRN: [id number] Date of Birth: Sep 28, 1955  Subjective/Objective:                  Patient was admitted for a T4-L1 laminectomy and fenestration of arachnoid cysts. Lives at home alone. CM will follow for discharge needs pending PT/OT evals and physician orders.   Action/Plan:   Expected Discharge Date:                  Expected Discharge Plan:     In-House Referral:     Discharge planning Services     Post Acute Care Choice:    Choice offered to:     DME Arranged:    DME Agency:     HH Arranged:    HH Agency:     Status of Service:     If discussed at [employer] of Stay Meetings, dates discussed:    Additional Comments:  Rolm Baptise, RN 07/22/2016, 12:14 PM

## 2016-07-23 DIAGNOSIS — G9619 Other disorders of meninges, not elsewhere classified: Principal | ICD-10-CM

## 2016-07-23 DIAGNOSIS — R269 Unspecified abnormalities of gait and mobility: Secondary | ICD-10-CM

## 2016-07-23 NOTE — Progress Notes (Signed)
No issues overnight. Pt able to get OOB with PT today.  EXAM:  BP 114/77 (BP Location: Left Arm)   Pulse 82   Temp 98.1 F (36.7 C) (Oral)   Resp 16   Ht 5\' 5"  (1.651 m)   Wt 90.8 kg (200 lb 2.8 oz)   SpO2 92%   BMI 33.31 kg/m   Awake, alert, oriented  Speech fluent, appropriate  CN grossly intact  5/5 BUE  4 - 4+/5 BLE Wound c/d/i  IMPRESSION:  61 y.o. female s/p fenestration of spinal arachnoid cysts. Recovering as expected.  PLAN: - Will get PMR consult for possible CIR - Cont to mobilize with PT

## 2016-07-23 NOTE — Progress Notes (Signed)
No issues overnight. Pt has no current complaints. Nausea/vomiting has resolved. Does not report any drainage from back wound.  EXAM:  BP 136/75 (BP Location: Right Arm)   Pulse 79   Temp 98.3 F (36.8 C) (Oral)   Resp 16   Ht 5\' 5"  (1.651 m)   Wt 90.8 kg (200 lb 2.8 oz)   SpO2 96%   BMI 33.31 kg/m   Awake, alert, oriented  Speech fluent, appropriate  CN grossly intact  5/5 BUE, 4/5 BLE Wound c/d/i  IMPRESSION:  61 y.o. female s/p fenestration of spinal arachnoid cysts, doing well  PLAN: - Mobilize today with PT/OT

## 2016-07-23 NOTE — Progress Notes (Signed)
Rehab Admissions Coordinator Note:  Patient was screened by Retta Diones for appropriateness for an Inpatient Acute Rehab Consult.  At this time, we are recommending Inpatient Rehab consult.  Retta Diones 07/23/2016, 11:57 AM  I can be reached at [phone].

## 2016-07-23 NOTE — Consult Note (Signed)
Physical Medicine and Rehabilitation Consult Reason for Consult: Spinal arachnoid cyst Referring Physician: Dr. Kathyrn Sheriff   HPI: Katrina Donaldson is a 61 y.o. right handed female with history of hypertension, hypercoagulable state, [facility] 11/29/2014 requiring surgery in [facility]. Per chart review patient independent with a cane prior to admission. Multilevel with 5 steps to entry. She has a sister and brother-in-law all and daughter recently moved into the downstairs of the home. Family in and out and cannot provide 24-hour care. Presented 07/19/2016 with gait disorder with decreased balance and some intermittent falls. 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 Underwent T11-L1 laminectomy, T4-T7 laminectomy, fenestration of arachnoid cyst 07/19/2016 per Dr. Kathyrn Sheriff. Hospital course pain management. No back brace required.   Review of Systems  Constitutional: Negative for chills and fever.  HENT: Negative for ear pain, hearing loss and tinnitus.   Eyes: Negative for blurred vision and double vision.  Respiratory: Negative for cough and shortness of breath.   Cardiovascular: Positive for leg swelling. Negative for chest pain and palpitations.  Gastrointestinal: Positive for constipation. Negative for nausea and vomiting.       GERD  Genitourinary: Positive for urgency. Negative for dysuria and hematuria.  Musculoskeletal: Positive for falls and myalgias.  Skin: Negative for rash.  Neurological: Positive for tingling and weakness. Negative for seizures and loss of consciousness.  Psychiatric/Behavioral: Positive for depression.  All other systems reviewed and are negative.  Past Medical History:  Diagnosis Date  . Brain aneurysm   . Depression   . History of DVT (deep vein thrombosis)   . Hypercoagulable state ([place])   . Hypertension     . Hypothyroidism   . Stroke [facility]) 2016   [facility] 11/29/14   Past Surgical History:  Procedure Laterality Date  . ABDOMINAL HYSTERECTOMY     patient denies  . BRAIN SURGERY     aneurysm  . goiter    . LAMINECTOMY N/A 07/19/2016   Procedure: LAMINECTOMY THORACIC FOUR THORACIC FIVE, THORACIC SIX TO THORACIC EIGHT,THORACIC TEN TO LUMBAR ONE, FENESTRATION OF ARACHNOID CYSTS;  Surgeon: Consuella Lose, MD;  Location: [place];  Service: Neurosurgery;  Laterality: N/A;  . THYROIDECTOMY     History reviewed. No pertinent family history. Social History:  reports that she has never smoked. She has never used smokeless tobacco. She reports that she does not drink alcohol or use drugs. Allergies:  Allergies  Allergen Reactions  . Shrimp [Shellfish Allergy] Other (See Comments)    "tongue feels strange"   Medications Prior to Admission  Medication Sig Dispense Refill  . acetaminophen (TYLENOL) 500 MG tablet Take 500-1,000 mg by mouth every 6 (six) hours as needed (for pain).    Marland Kitchen amLODipine (NORVASC) 10 MG tablet Take 10 mg by mouth daily.    Marland Kitchen aspirin EC 81 MG tablet Take 81 mg by mouth daily.    . fluticasone (FLONASE) 50 MCG/ACT nasal spray Place 1 spray into both nostrils daily.    . metoprolol (LOPRESSOR) 100 MG tablet Take 100 mg by mouth 2 (two) times daily.    Marland Kitchen omeprazole (PRILOSEC) 20 MG capsule Take 20 mg by mouth daily.    Marland Kitchen PARoxetine (PAXIL) 20 MG tablet Take 20 mg by mouth daily.    . simethicone (MYLICON) 80 MG chewable tablet Chew 80-160 mg by mouth every 6 (six) hours as needed for flatulence.    Marland Kitchen  thiamine 250 MG tablet Take 125 mg by mouth daily.    . methocarbamol (ROBAXIN) 750 MG tablet Take 750 mg by mouth 3 (three) times daily as needed. For muscle spasms/back pain.      Home: Home Living Family/patient expects to be discharged to:: Inpatient rehab Living Arrangements: Alone, Other relatives (sister and brother in law and their daughter moved in downstairs) Available  Help at Discharge: Family Home Access: Stairs to enter Entrance Stairs-Number of Steps: 5 Entrance Stairs-Rails: Right, Left Home Layout: Multi-level (split level) Alternate Level Stairs-Number of Steps: has bedroom on entry level Bathroom Shower/Tub: Tub/shower unit Home Equipment: Cane - single point, Grab bars - tub/shower  Functional History: Prior Function Level of Independence: Independent with assistive device(s) Comments: drove and cooked and cleaned,  some falls a few months ago Functional Status:  Mobility: Bed Mobility General bed mobility comments: up in recliner Transfers Overall transfer level: Needs assistance Equipment used: Rolling walker (2 wheeled) Transfers: Sit to/from Stand Sit to Stand: Max assist, +2 physical assistance General transfer comment: cues for anterior weight shift, hand placement, pushing up through UE's and LE's, increased time, use of momentum, heavy posterior bias with lifting and lowering assist needed Ambulation/Gait Ambulation/Gait assistance: Mod assist, +2 safety/equipment Ambulation Distance (Feet): 8 Feet Assistive device: Rolling walker (2 wheeled) Gait Pattern/deviations: Step-to pattern, Shuffle, Decreased stride length, Wide base of support, Trunk flexed General Gait Details: assist to keep hips forward to prevent posterior LOB, assist for weight shift, cues for stepping, assist for walker management    ADL:    Cognition: Cognition Overall Cognitive Status: Impaired/Different from baseline Orientation Level: Oriented X4 Cognition Arousal/Alertness: Awake/alert Behavior During Therapy: Flat affect Overall Cognitive Status: Impaired/Different from baseline Area of Impairment: Following commands, Problem solving Following Commands: Follows one step commands with increased time Problem Solving: Slow processing, Decreased initiation  Blood pressure 114/77, pulse 82, temperature 98.1 F (36.7 C), temperature source Oral, resp.  rate 16, height 5\' 5"  (1.651 m), weight 90.8 kg (200 lb 2.8 oz), SpO2 92 %. Physical Exam  Constitutional: She is oriented to person, place, and time.  HENT:  Head: Normocephalic.  Eyes:  Pupils round and reactive to light  Neck: Normal range of motion. Neck supple. No thyromegaly present.  Cardiovascular: Normal rate, regular rhythm and normal heart sounds.   Respiratory: Effort normal and breath sounds normal. No respiratory distress.  GI: Soft. Bowel sounds are normal. She exhibits no distension.  Neurological: She is alert and oriented to person, place, and time.  Follows full commands. UE 3-4/5 deltoids, 4/5 biceps, triceps, wrist and hands--?pain component. LE: 3/5 HF and KE and 4/5 ADF/PF. Senses pain in both LE's and trunk. DTR's 1+  Skin:  Patient sitting up in chair back incision not examined  Psychiatric: She has a normal mood and affect. Her behavior is normal. Judgment and thought content normal.    No results found for this or any previous visit (from the past 24 hour(s)). Dg Abd Portable 1v  Result Date: 07/21/2016 CLINICAL DATA:  Vomiting.  Postop from thoracic laminectomy. EXAM: PORTABLE ABDOMEN - 1 VIEW COMPARISON:  None. FINDINGS: Generalized gaseous distention of small bowel and colon seen, consistent with mild ileus. No evidence of bowel obstruction. Lower thoracic laminectomies and skin staples noted . IMPRESSION: Mild postop ileus . Electronically Signed   By: Earle Gell M.D.   On: 07/21/2016 14:33    Assessment/Plan: Diagnosis: large thoraco-lumbar spinal cyst with myelopathy 1. Does the need for close, 24  hr/day medical supervision in concert with the patient's rehab needs make it unreasonable for this patient to be served in a less intensive setting? Yes 2. Co-Morbidities requiring supervision/potential complications: hypertension and blood pressure control while mobilizing, wound care, pain mgt, anemia 3. Due to bladder management, bowel management, safety,  skin/wound care, disease management, medication administration, pain management and patient education, does the patient require 24 hr/day rehab nursing? Yes 4. Does the patient require coordinated care of a physician, rehab nurse, PT (1-2 hrs/day, 5 days/week) and OT (1-2 hrs/day, 5 days/week) to address physical and functional deficits in the context of the above medical diagnosis(es)? Yes Addressing deficits in the following areas: balance, endurance, locomotion, strength, transferring, bowel/bladder control, bathing, dressing, feeding, grooming, toileting and psychosocial support 5. Can the patient actively participate in an intensive therapy program of at least 3 hrs of therapy per day at least 5 days per week? Yes 6. The potential for patient to make measurable gains while on inpatient rehab is excellent 7. Anticipated functional outcomes upon discharge from inpatient rehab are modified independent and supervision  with PT, modified independent and supervision with OT, n/a with SLP. 8. Estimated rehab length of stay to reach the above functional goals is: 12-16 days 9. Does the patient have adequate social supports and living environment to accommodate these discharge functional goals? Yes 10. Anticipated D/C setting: Home 11. Anticipated post D/C treatments: HH therapy and Outpatient therapy 12. Overall Rehab/Functional Prognosis: excellent  RECOMMENDATIONS: This patient's condition is appropriate for continued rehabilitative care in the following setting: CIR Patient has agreed to participate in recommended program. Yes Note that insurance prior authorization may be required for reimbursement for recommended care.  Comment: Rehab Admissions Coordinator to follow up.  Thanks,  Meredith Staggers, MD, Mellody Drown    Cathlyn Parsons., PA-C 07/23/2016

## 2016-07-23 NOTE — Evaluation (Signed)
Physical Therapy Evaluation Patient Details Name: Katrina Donaldson MRN: [id number] DOB: 1956-03-26 Today's Date: 07/23/2016   History of Present Illness  Patient is a 61 y/o female admitted with LE weakness and imbalance due to arachnoid spinal cysts, now s/p T11-L1 laminectomy, fenestration of arachnoid cyst and T4-T7 laminectomy, fenestration of arachnoid cyst.  PMH positive for brain aneurysm, stroke [facility] 10/2014), HTN, DVT, hypothyroid  Clinical Impression  Patient presents with decreased independence and safety with mobility due to deficits listed in PT problem list.  Was independent with cane at home, now requiring +2 A for transfers and short distance ambulation in room.  Limited with ambulation due to burning R shoulder and reports feeling hot.  Will benefit from continued skilled PT in the acute setting to address deficits and assist with mobility prior to d/c to CIR level rehab before d/c home.     Follow Up Recommendations CIR;Supervision/Assistance - 24 hour    Equipment Recommendations  Rolling walker with 5" wheels    Recommendations for Other Services Rehab consult     Precautions / Restrictions Precautions Precautions: Fall;Back Required Braces or Orthoses:  (no brace)      Mobility  Bed Mobility               General bed mobility comments: up in recliner  Transfers Overall transfer level: Needs assistance Equipment used: Rolling walker (2 wheeled) Transfers: Sit to/from Stand Sit to Stand: Max assist;+2 physical assistance         General transfer comment: cues for anterior weight shift, hand placement, pushing up through UE's and LE's, increased time, use of momentum, heavy posterior bias with lifting and lowering assist needed  Ambulation/Gait Ambulation/Gait assistance: Mod assist;+2 safety/equipment Ambulation Distance (Feet): 8 Feet Assistive device: Rolling walker (2 wheeled) Gait Pattern/deviations: Step-to pattern;Shuffle;Decreased stride  length;Wide base of support;Trunk flexed     General Gait Details: assist to keep hips forward to prevent posterior LOB, assist for weight shift, cues for stepping, assist for walker management  Stairs            Wheelchair Mobility    Modified Rankin (Stroke Patients Only)       Balance Overall balance assessment: History of Falls;Needs assistance   Sitting balance-Leahy Scale: Poor Sitting balance - Comments: leaning back sitting on chair with UE support for preventing LOB posteriorly Postural control: Posterior lean Standing balance support: Bilateral upper extremity supported Standing balance-Leahy Scale: Poor Standing balance comment: mod support with walker for balance again with posterior bias                             Pertinent Vitals/Pain Pain Assessment: 0-10 Pain Score: 1  Pain Location: back at surgery site Pain Descriptors / Indicators: Discomfort;Operative site guarding Pain Intervention(s): Monitored during session    Home Living Family/patient expects to be discharged to:: Inpatient rehab Living Arrangements: Alone;Other relatives (sister and brother in law and their daughter moved in downstairs) Available Help at Discharge: Family   Home Access: Stairs to enter Entrance Stairs-Rails: Psychiatric nurse of Steps: 5 Home Layout: Multi-level (split level) Home Equipment: Cane - single point;Grab bars - tub/shower      Prior Function Level of Independence: Independent with assistive device(s)         Comments: drove and cooked and cleaned,  some falls a few months ago     Hand Dominance        Extremity/Trunk Assessment   Upper  Extremity Assessment Upper Extremity Assessment: Defer to OT evaluation    Lower Extremity Assessment Lower Extremity Assessment: RLE deficits/detail;LLE deficits/detail RLE Deficits / Details: AAROM WFL, strength hip flexion 2/5, knee extension 4-/5, ankle DF 4+/5, reports  sensation intact to light touch LLE Deficits / Details: AAROM WFL, strength hip flexion 2-/5, knee extension 3+/5, ankle DF 4+/5, reports sensation intact to light touch       Communication   Communication: No difficulties  Cognition Arousal/Alertness: Awake/alert Behavior During Therapy: Flat affect Overall Cognitive Status: Impaired/Different from baseline Area of Impairment: Following commands;Problem solving       Following Commands: Follows one step commands with increased time     Problem Solving: Slow processing;Decreased initiation      General Comments      Exercises     Assessment/Plan    PT Assessment Patient needs continued PT services  PT Problem List            PT Treatment Interventions DME instruction;Therapeutic activities;Therapeutic exercise;Gait training;Patient/family education;Balance training;Functional mobility training    PT Goals (Current goals can be found in the Care Plan section)  Acute Rehab PT Goals Patient Stated Goal: To go to rehab then home PT Goal Formulation: With patient Time For Goal Achievement: 07/30/16 Potential to Achieve Goals: Fair    Frequency Min 5X/week   Barriers to discharge        Co-evaluation               End of Session Equipment Utilized During Treatment: Gait belt Activity Tolerance: Patient limited by pain;Patient limited by fatigue Patient left: in chair;with call bell/phone within reach           Time: 1040-1112 PT Time Calculation (min) (ACUTE ONLY): 32 min   Charges:   PT Evaluation $PT Eval Moderate Complexity: 1 Procedure PT Treatments $Gait Training: 8-22 mins   PT G Codes:        Reginia Naas July 25, 2016, 11:35 AM  Magda Kiel, [place] Jul 25, 2016

## 2016-07-23 NOTE — Evaluation (Signed)
Occupational Therapy Evaluation Patient Details Name: Katrina Donaldson MRN: [id number] DOB: 04/01/1956 Today's Date: 07/23/2016    History of Present Illness Patient is a 61 y/o female admitted with LE weakness and imbalance due to arachnoid spinal cysts, now s/p T11-L1 laminectomy, fenestration of arachnoid cyst and T4-T7 laminectomy, fenestration of arachnoid cyst.  PMH positive for brain aneurysm, stroke [facility] 10/2014), HTN, DVT, hypothyroid   Clinical Impression   Patient presenting with decreased ADL and functional mobility independence. Patient independent to mod I PTA. Patient currently functioning at an overall min to total assist level. Patient will benefit from acute OT to increase overall independence in the areas of ADLs, functional mobility, and overall safety in order to safely discharge to venue listed below.     Follow Up Recommendations  CIR;Supervision/Assistance - 24 hour    Equipment Recommendations  3 in 1 bedside commode;Tub/shower bench    Recommendations for Other Services Rehab consult   Precautions / Restrictions Precautions Precautions: Fall;Back Required Braces or Orthoses:  (no brace per orders) Restrictions Weight Bearing Restrictions: No      Mobility Bed Mobility General bed mobility comments: up in recliner  Transfers Did not occur during OT eval, see PT eval for more information.    Balance Overall balance assessment: History of Falls;Needs assistance Sitting-balance support: Feet supported;Bilateral upper extremity supported Sitting balance-Leahy Scale: Poor Sitting balance - Comments: leaning back sitting on chair with UE support for preventing LOB posteriorly    ADL Overall ADL's : Needs assistance/impaired Eating/Feeding: Set up;Sitting to min assist.   Grooming: Minimal assistance;Sitting   Upper Body Bathing: Maximal assistance;Sitting   Lower Body Bathing: Total assistance;Sit to/from stand;+2 for physical assistance - according  to PT eval, pt is max assist +2 for standing.   Upper Body Dressing : Moderate assistance;Sitting   Lower Body Dressing: Total assistance;Sit to/from stand;+2 for physical assistance - according to PT eval, pt is max assist +2 for standing     Toilet Transfer Details (indicate cue type and reason): did not occur, anticipate pt will require max assist +2 for toilet transfers [place] and Hygiene: Total assistance;Sit to/from stand Toileting - Clothing Manipulation Details (indicate cue type and reason): did not occur, anticipate pt will require max assist +2 for toileting   Tub/Shower Transfer Details (indicate cue type and reason): did not attempt, not appropriate at this time   General ADL Comments: Overall pt takes increased time. According to pt she was "slow" PTA.     Pertinent Vitals/Pain Pain Assessment: No/denies pain     Hand Dominance Right   Extremity/Trunk Assessment Upper Extremity Assessment Upper Extremity Assessment: RUE deficits/detail;LUE deficits/detail RUE Deficits / Details: Shoulder strength=3/5, elbow to distal strength=4/5. Pt with overall decreased coordination in RUE LUE Deficits / Details: Shoulder strength=2/5, elbow to distal strength=3+/5. Pt with overall decreased coordination in LUE   Lower Extremity Assessment Lower Extremity Assessment: Defer to PT evaluation   Cervical / Trunk Assessment Cervical / Trunk Assessment: Normal   Communication Communication Communication: No difficulties   Cognition Arousal/Alertness: Awake/alert Behavior During Therapy: Flat affect Overall Cognitive Status: Impaired/Different from baseline Area of Impairment: Following commands;Problem solving;Awareness       Following Commands: Follows one step commands with increased time   Awareness: Emergent Problem Solving: Slow processing;Decreased initiation General Comments: Pt made commend towards end of session, "is this my room".                Home Living Family/patient expects to be discharged  to:: Inpatient rehab Living Arrangements: Alone;Other relatives (sister, BIL, and their daughter moved in downstairs) Available Help at Discharge: Family   Home Access: Stairs to enter Technical brewer of Steps: 5 Entrance Stairs-Rails: Right;Left Home Layout: Multi-level (split level) Alternate Level Stairs-Number of Steps: has bedroom on entry level   Bathroom Shower/Tub: Tub/shower unit Shower/tub characteristics: Curtain Biochemist, clinical: Handicapped height     Home Equipment: [place] - single point;Grab bars - tub/shower          Prior Functioning/Environment Level of Independence: Independent with assistive device(s)        Comments: drove and cooked and cleaned,  some falls a few months ago        OT Problem List: Decreased strength;Decreased range of motion;Decreased activity tolerance;Impaired balance (sitting and/or standing);Decreased coordination;Decreased safety awareness;Decreased knowledge of use of DME or AE;Decreased knowledge of precautions;Decreased cognition;Pain;Impaired sensation;Impaired UE functional use   OT Treatment/Interventions: Self-care/ADL training;Therapeutic exercise;Energy conservation;DME and/or AE instruction;Therapeutic activities;Patient/family education;Balance training    OT Goals(Current goals can be found in the care plan section) Acute Rehab OT Goals Patient Stated Goal: To go to rehab then home OT Goal Formulation: With patient Time For Goal Achievement: 08/06/16 Potential to Achieve Goals: Good ADL Goals Pt Will Perform Grooming: with set-up;sitting Pt Will Perform Upper Body Bathing: with min assist;sitting Pt Will Perform Lower Body Bathing: with mod assist;sit to/from stand;with adaptive equipment Pt Will Perform Upper Body Dressing: with min assist;sitting Pt Will Perform Lower Body Dressing: with mod assist;sit to/from stand;with adaptive  equipment Pt/caregiver will Perform Home Exercise Program: Increased strength;Both right and left upper extremity;With Supervision;With written HEP provided  OT Frequency: Min 3X/week   Barriers to D/C: None known at this time    End of Session Activity Tolerance: Patient tolerated treatment well Patient left: in chair;with call bell/phone within reach   Time: [id number] OT Time Calculation (min): 26 min Charges:  OT General Charges $OT Visit: 1 Procedure OT Evaluation $OT Eval Moderate Complexity: 1 Procedure OT Treatments $Self Care/Home Management : 8-22 mins  Chrys Racer , MS, OTR/L, CLT Pager: [phone] 07/23/2016, 4:30 PM

## 2016-07-24 ENCOUNTER — Inpatient Hospital Stay (HOSPITAL_COMMUNITY)
Admission: RE | Admit: 2016-07-24 | Discharge: 2016-08-24 | DRG: 560 | Disposition: A | Discharge status: Home Health | Payer: BC Managed Care – PPO | Source: Intra-hospital | Attending: Physical Medicine & Rehabilitation | Admitting: Physical Medicine & Rehabilitation

## 2016-07-24 DIAGNOSIS — Z90710 Acquired absence of both cervix and uterus: Secondary | ICD-10-CM

## 2016-07-24 DIAGNOSIS — Z7982 Long term (current) use of aspirin: Secondary | ICD-10-CM | POA: Diagnosis not present

## 2016-07-24 DIAGNOSIS — B9620 Unspecified Escherichia coli [E. coli] as the cause of diseases classified elsewhere: Secondary | ICD-10-CM | POA: Diagnosis present

## 2016-07-24 DIAGNOSIS — N390 Urinary tract infection, site not specified: Secondary | ICD-10-CM | POA: Diagnosis present

## 2016-07-24 DIAGNOSIS — D62 Acute posthemorrhagic anemia: Secondary | ICD-10-CM | POA: Diagnosis present

## 2016-07-24 DIAGNOSIS — Z8679 Personal history of other diseases of the circulatory system: Secondary | ICD-10-CM

## 2016-07-24 DIAGNOSIS — G8220 Paraplegia, unspecified: Secondary | ICD-10-CM | POA: Diagnosis not present

## 2016-07-24 DIAGNOSIS — Z4789 Encounter for other orthopedic aftercare: Secondary | ICD-10-CM | POA: Diagnosis present

## 2016-07-24 DIAGNOSIS — D6859 Other primary thrombophilia: Secondary | ICD-10-CM

## 2016-07-24 DIAGNOSIS — G8918 Other acute postprocedural pain: Secondary | ICD-10-CM | POA: Diagnosis not present

## 2016-07-24 DIAGNOSIS — R630 Anorexia: Secondary | ICD-10-CM | POA: Diagnosis not present

## 2016-07-24 DIAGNOSIS — I10 Essential (primary) hypertension: Secondary | ICD-10-CM | POA: Diagnosis present

## 2016-07-24 DIAGNOSIS — M25561 Pain in right knee: Secondary | ICD-10-CM | POA: Diagnosis not present

## 2016-07-24 DIAGNOSIS — D696 Thrombocytopenia, unspecified: Secondary | ICD-10-CM

## 2016-07-24 DIAGNOSIS — G919 Hydrocephalus, unspecified: Secondary | ICD-10-CM | POA: Diagnosis not present

## 2016-07-24 DIAGNOSIS — Z86718 Personal history of other venous thrombosis and embolism: Secondary | ICD-10-CM

## 2016-07-24 DIAGNOSIS — F32A Depression, unspecified: Secondary | ICD-10-CM

## 2016-07-24 DIAGNOSIS — I82412 Acute embolism and thrombosis of left femoral vein: Secondary | ICD-10-CM

## 2016-07-24 DIAGNOSIS — Z8673 Personal history of transient ischemic attack (TIA), and cerebral infarction without residual deficits: Secondary | ICD-10-CM | POA: Diagnosis not present

## 2016-07-24 DIAGNOSIS — Z79899 Other long term (current) drug therapy: Secondary | ICD-10-CM | POA: Diagnosis not present

## 2016-07-24 DIAGNOSIS — F329 Major depressive disorder, single episode, unspecified: Secondary | ICD-10-CM | POA: Diagnosis present

## 2016-07-24 DIAGNOSIS — M4714 Other spondylosis with myelopathy, thoracic region: Secondary | ICD-10-CM

## 2016-07-24 DIAGNOSIS — R339 Retention of urine, unspecified: Secondary | ICD-10-CM | POA: Diagnosis not present

## 2016-07-24 DIAGNOSIS — Z91013 Allergy to seafood: Secondary | ICD-10-CM

## 2016-07-24 DIAGNOSIS — G959 Disease of spinal cord, unspecified: Secondary | ICD-10-CM | POA: Diagnosis not present

## 2016-07-24 DIAGNOSIS — D72829 Elevated white blood cell count, unspecified: Secondary | ICD-10-CM

## 2016-07-24 DIAGNOSIS — K5900 Constipation, unspecified: Secondary | ICD-10-CM | POA: Diagnosis present

## 2016-07-24 DIAGNOSIS — M7989 Other specified soft tissue disorders: Secondary | ICD-10-CM | POA: Diagnosis not present

## 2016-07-24 DIAGNOSIS — R410 Disorientation, unspecified: Secondary | ICD-10-CM | POA: Diagnosis not present

## 2016-07-24 DIAGNOSIS — E876 Hypokalemia: Secondary | ICD-10-CM | POA: Diagnosis present

## 2016-07-24 DIAGNOSIS — R29898 Other symptoms and signs involving the musculoskeletal system: Secondary | ICD-10-CM

## 2016-07-24 DIAGNOSIS — K5903 Drug induced constipation: Secondary | ICD-10-CM

## 2016-07-24 MED ORDER — OXYCODONE-ACETAMINOPHEN 5-325 MG PO TABS: 1.0000 | ORAL_TABLET | ORAL | 0 refills | Status: DC | PRN

## 2016-07-24 MED ORDER — NAPHAZOLINE-GLYCERIN 0.012-0.2 % OP SOLN
1.0000 [drp] | Freq: Four times a day (QID) | OPHTHALMIC | Status: DC | PRN
  Filled 2016-07-24: qty 15

## 2016-07-24 MED ORDER — ACETAMINOPHEN 325 MG PO TABS
650.0000 mg | ORAL_TABLET | ORAL | Status: DC | PRN
  Administered 2016-07-25 – 2016-07-31 (×8): 650 mg via ORAL
  Filled 2016-07-24 (×6): qty 2

## 2016-07-24 MED ORDER — METHOCARBAMOL 1000 MG/10ML IJ SOLN
500.0000 mg | Freq: Four times a day (QID) | INTRAVENOUS | Status: DC | PRN
  Filled 2016-07-24: qty 5

## 2016-07-24 MED ORDER — VITAMIN B-1 100 MG PO TABS
100.0000 mg | ORAL_TABLET | Freq: Every day | ORAL | Status: DC
  Administered 2016-07-25 – 2016-08-24 (×30): 100 mg via ORAL
  Filled 2016-07-24 (×30): qty 1

## 2016-07-24 MED ORDER — ONDANSETRON HCL 4 MG/2ML IJ SOLN: 4.0000 mg | Freq: Four times a day (QID) | INTRAMUSCULAR | Status: DC | PRN

## 2016-07-24 MED ORDER — ACETAMINOPHEN 650 MG RE SUPP: 650.0000 mg | RECTAL | Status: DC | PRN

## 2016-07-24 MED ORDER — OXYCODONE-ACETAMINOPHEN 5-325 MG PO TABS
1.0000 | ORAL_TABLET | ORAL | Status: DC | PRN
  Administered 2016-07-25 – 2016-07-28 (×8): 2 via ORAL
  Administered 2016-07-30 – 2016-08-21 (×14): 1 via ORAL
  Filled 2016-07-24 (×2): qty 1
  Filled 2016-07-24 (×3): qty 2
  Filled 2016-07-24 (×3): qty 1
  Filled 2016-07-24: qty 2
  Filled 2016-07-24 (×3): qty 1
  Filled 2016-07-24: qty 2
  Filled 2016-07-24 (×7): qty 1
  Filled 2016-07-24 (×3): qty 2

## 2016-07-24 MED ORDER — NAPHAZOLINE-GLYCERIN 0.012-0.2 % OP SOLN
1.0000 [drp] | Freq: Four times a day (QID) | OPHTHALMIC | Status: DC | PRN
  Administered 2016-07-24: 2 [drp] via OPHTHALMIC
  Filled 2016-07-24: qty 15

## 2016-07-24 MED ORDER — ENSURE ENLIVE PO LIQD
237.0000 mL | Freq: Three times a day (TID) | ORAL | Status: DC
  Administered 2016-07-24 – 2016-08-07 (×19): 237 mL via ORAL

## 2016-07-24 MED ORDER — SIMETHICONE 80 MG PO CHEW
80.0000 mg | CHEWABLE_TABLET | Freq: Four times a day (QID) | ORAL | Status: DC | PRN
  Administered 2016-08-05: 160 mg via ORAL
  Filled 2016-07-24: qty 2

## 2016-07-24 MED ORDER — FLUTICASONE PROPIONATE 50 MCG/ACT NA SUSP
1.0000 | Freq: Every day | NASAL | Status: DC
  Administered 2016-07-29 – 2016-08-24 (×22): 1 via NASAL
  Filled 2016-07-24: qty 16

## 2016-07-24 MED ORDER — AMLODIPINE BESYLATE 10 MG PO TABS
10.0000 mg | ORAL_TABLET | Freq: Every day | ORAL | Status: DC
  Administered 2016-07-25 – 2016-08-24 (×31): 10 mg via ORAL
  Filled 2016-07-24 (×31): qty 1

## 2016-07-24 MED ORDER — BISACODYL 10 MG RE SUPP: 10.0000 mg | Freq: Every day | RECTAL | Status: DC | PRN

## 2016-07-24 MED ORDER — ONDANSETRON HCL 4 MG PO TABS: 4.0000 mg | ORAL_TABLET | Freq: Four times a day (QID) | ORAL | Status: DC | PRN

## 2016-07-24 MED ORDER — SENNA 8.6 MG PO TABS
1.0000 | ORAL_TABLET | Freq: Two times a day (BID) | ORAL | Status: DC
  Administered 2016-07-24 – 2016-08-24 (×56): 8.6 mg via ORAL
  Filled 2016-07-24 (×56): qty 1

## 2016-07-24 MED ORDER — DOCUSATE SODIUM 100 MG PO CAPS
100.0000 mg | ORAL_CAPSULE | Freq: Two times a day (BID) | ORAL | Status: DC
  Administered 2016-07-24 – 2016-08-24 (×57): 100 mg via ORAL
  Filled 2016-07-24 (×59): qty 1

## 2016-07-24 MED ORDER — PAROXETINE HCL 10 MG PO TABS
20.0000 mg | ORAL_TABLET | Freq: Every day | ORAL | Status: DC
  Administered 2016-07-25 – 2016-08-24 (×31): 20 mg via ORAL
  Filled 2016-07-24 (×32): qty 2

## 2016-07-24 MED ORDER — ADULT MULTIVITAMIN W/MINERALS CH
1.0000 | ORAL_TABLET | Freq: Every day | ORAL | Status: DC
  Administered 2016-07-25 – 2016-08-24 (×30): 1 via ORAL
  Filled 2016-07-24 (×30): qty 1

## 2016-07-24 MED ORDER — PANTOPRAZOLE SODIUM 40 MG PO TBEC
40.0000 mg | DELAYED_RELEASE_TABLET | Freq: Two times a day (BID) | ORAL | Status: DC
  Administered 2016-07-24 – 2016-08-24 (×61): 40 mg via ORAL
  Filled 2016-07-24 (×38): qty 1
  Filled 2016-07-24: qty 2
  Filled 2016-07-24 (×22): qty 1

## 2016-07-24 MED ORDER — SORBITOL 70 % SOLN
30.0000 mL | Freq: Every day | Status: DC | PRN
  Administered 2016-07-25 – 2016-08-18 (×4): 30 mL via ORAL
  Filled 2016-07-24 (×4): qty 30

## 2016-07-24 MED ORDER — METHOCARBAMOL 500 MG PO TABS
500.0000 mg | ORAL_TABLET | Freq: Four times a day (QID) | ORAL | Status: DC | PRN
  Administered 2016-07-25 – 2016-08-09 (×10): 500 mg via ORAL
  Filled 2016-07-24 (×10): qty 1

## 2016-07-24 MED ORDER — METOPROLOL TARTRATE 50 MG PO TABS
100.0000 mg | ORAL_TABLET | Freq: Two times a day (BID) | ORAL | Status: DC
  Administered 2016-07-24 – 2016-08-24 (×61): 100 mg via ORAL
  Filled 2016-07-24 (×30): qty 2
  Filled 2016-07-24: qty 4
  Filled 2016-07-24 (×16): qty 2
  Filled 2016-07-24: qty 4
  Filled 2016-07-24 (×16): qty 2

## 2016-07-24 NOTE — Progress Notes (Signed)
Report was called to [facility]. Pt given pain medicine before going. Family updated.

## 2016-07-24 NOTE — H&P (Signed)
Physical Medicine and Rehabilitation Admission H&P    Chief complaint: Weakness  HPI: Katrina Donaldson is a 61 y.o. right handed female with history of hypertension, hypercoagulable state, [facility] 11/29/2014 requiring surgery in [facility]. Per chart review patient independent with a cane prior to admission. Multilevel with 5 steps to entry. She has a sister and brother-in-law all and daughter recently moved into the downstairs of the home. Family in and out and cannot provide 24-hour care. Presented 07/19/2016 with gait disorder with decreased balance and some intermittent falls. MRI cervical thoracic and lumbar spine reviewed, showing large loculated arachnoid cyst with spinal cord compression extending from the top of the thoracic spine to the upper lumbar spine. Underwent T11-L1 laminectomy, T4-T7 laminectomy, fenestration of arachnoid cyst 07/19/2016 per Dr. Kathyrn Sheriff. Hospital course pain management. No back brace required. Physical occupational therapy evaluations completed with recommendations of physical medicine rehabilitation consult. Patient was admitted for a comprehensive rehabilitation program  Review of Systems  Constitutional: Negative for chills and fever.  HENT: Negative for hearing loss and tinnitus.   Eyes: Negative for blurred vision and double vision.  Respiratory: Negative for cough and shortness of breath.   Cardiovascular: Negative for chest pain and palpitations.  Gastrointestinal: Positive for constipation. Negative for nausea and vomiting.       GERD  Genitourinary: Positive for urgency.  Musculoskeletal: Positive for joint pain and myalgias.  Skin: Negative for rash.  Neurological: Positive for tingling, sensory change and weakness. Negative for seizures and loss of consciousness.  All other systems reviewed and are negative.  Past Medical History:  Diagnosis Date  . Brain aneurysm   . Depression   . History of DVT (deep vein thrombosis)   .  Hypercoagulable state ([place])   . Hypertension   . Hypothyroidism   . Stroke [facility]) 2016   [facility] 11/29/14   Past Surgical History:  Procedure Laterality Date  . ABDOMINAL HYSTERECTOMY     patient denies  . BRAIN SURGERY     aneurysm  . goiter    . LAMINECTOMY N/A 07/19/2016   Procedure: LAMINECTOMY THORACIC FOUR THORACIC FIVE, THORACIC SIX TO THORACIC EIGHT,THORACIC TEN TO LUMBAR ONE, FENESTRATION OF ARACHNOID CYSTS;  Surgeon: Consuella Lose, MD;  Location: [place];  Service: Neurosurgery;  Laterality: N/A;  . THYROIDECTOMY     History reviewed. No pertinent family history. Social History:  reports that she has never smoked. She has never used smokeless tobacco. She reports that she does not drink alcohol or use drugs. Allergies:  Allergies  Allergen Reactions  . Shrimp [Shellfish Allergy] Other (See Comments)    "tongue feels strange"   Medications Prior to Admission  Medication Sig Dispense Refill  . acetaminophen (TYLENOL) 500 MG tablet Take 500-1,000 mg by mouth every 6 (six) hours as needed (for pain).    Marland Kitchen amLODipine (NORVASC) 10 MG tablet Take 10 mg by mouth daily.    Marland Kitchen aspirin EC 81 MG tablet Take 81 mg by mouth daily.    . fluticasone (FLONASE) 50 MCG/ACT nasal spray Place 1 spray into both nostrils daily.    . metoprolol (LOPRESSOR) 100 MG tablet Take 100 mg by mouth 2 (two) times daily.    Marland Kitchen omeprazole (PRILOSEC) 20 MG capsule Take 20 mg by mouth daily.    Marland Kitchen PARoxetine (PAXIL) 20 MG tablet Take 20 mg by mouth daily.    . simethicone (MYLICON) 80 MG chewable tablet Chew 80-160 mg by mouth every 6 (six) hours as needed for flatulence.    Marland Kitchen  thiamine 250 MG tablet Take 125 mg by mouth daily.    . methocarbamol (ROBAXIN) 750 MG tablet Take 750 mg by mouth 3 (three) times daily as needed. For muscle spasms/back pain.      Home: Home Living Family/patient expects to be discharged to:: Inpatient rehab Living Arrangements: Alone, Other relatives (sister, BIL, and their daughter  moved in downstairs) Available Help at Discharge: Family Home Access: Stairs to enter Entrance Stairs-Number of Steps: 5 Entrance Stairs-Rails: Right, Left Home Layout: Multi-level (split level) Alternate Level Stairs-Number of Steps: has bedroom on entry level Bathroom Shower/Tub: Chiropodist: Handicapped height Home Equipment: [place] - single point, Grab bars - tub/shower   Functional History: Prior Function Level of Independence: Independent with assistive device(s) Comments: drove and cooked and cleaned,  some falls a few months ago  Functional Status:  Mobility: Bed Mobility Overal bed mobility: Needs Assistance Bed Mobility: Rolling, Sidelying to Sit Rolling: Mod assist Sidelying to sit: Max assist General bed mobility comments:  assist with rail to roll max cues and assist to bend opposite knee; assist for legs off bed and to lift trunk to come up to sitting Transfers Overall transfer level: Needs assistance Equipment used: Rolling walker (2 wheeled) Transfers: Sit to/from Stand Sit to Stand: Max assist, +2 physical assistance General transfer comment: assist and cues for anterior weight shift, cues for hand placement, assist for bringing hips forward and time to reach to walker, leaning posteriorly Ambulation/Gait Ambulation/Gait assistance: Mod assist, +2 safety/equipment Ambulation Distance (Feet): 10 Feet Assistive device: Rolling walker (2 wheeled) Gait Pattern/deviations: Step-to pattern, Shuffle, Decreased stride length, Wide base of support, Trunk flexed General Gait Details: assist for hips forward to prevent LOB posterior, assist for moving walker, weight shift and cues for stepping, c/o UE fatigue and bringing chair closer, pt sat on corner of chair (controlled lowering to prevent falling)    ADL: ADL Overall ADL's : Needs assistance/impaired Eating/Feeding: Set up, Sitting Grooming: Minimal assistance, Sitting Upper Body Bathing: Maximal  assistance, Sitting Lower Body Bathing: Total assistance, Sit to/from stand, +2 for physical assistance Upper Body Dressing : Moderate assistance, Sitting Lower Body Dressing: Total assistance, Sit to/from stand, +2 for physical assistance Toilet Transfer Details (indicate cue type and reason): did not occur, anticipate pt will require max assist +2 for toilet transfers [place] and Hygiene: Total assistance, Sit to/from stand Toileting - Clothing Manipulation Details (indicate cue type and reason): did not occur, anticipate pt will require max assist +2 for toileting Tub/Shower Transfer Details (indicate cue type and reason): did not attempt, not appropriate at this time General ADL Comments: Overall pt takes increased time. According to pt she was "slow" PTA.   Cognition: Cognition Overall Cognitive Status: Impaired/Different from baseline Orientation Level: Oriented X4 Cognition Arousal/Alertness: Awake/alert Behavior During Therapy: Flat affect Overall Cognitive Status: Impaired/Different from baseline Area of Impairment: Following commands, Problem solving Following Commands: Follows one step commands with increased time Awareness: Emergent Problem Solving: Slow processing, Decreased initiation, Requires verbal cues, Requires tactile cues General Comments: slightly more engaging this session making comments and smiling  Physical Exam: Blood pressure 119/81, pulse (!) 108, temperature 98.9 F (37.2 C), temperature source Oral, resp. rate 18, height 5\' 5"  (1.651 m), weight 90.8 kg (200 lb 2.8 oz), SpO2 93 %. Physical Exam  Vitals reviewed. Constitutional: She is oriented to person, place, and time. She appears well-developed.  Obese  HENT:  Head: Normocephalic and atraumatic.  Eyes: Conjunctivae and EOM are normal. Left  eye exhibits no discharge.  Neck: Normal range of motion. Neck supple. No thyromegaly present.  Cardiovascular: Normal rate, regular  rhythm and normal heart sounds.   Respiratory: Effort normal and breath sounds normal. No respiratory distress.  GI: Soft. Bowel sounds are normal. She exhibits no distension. There is no tenderness.  Musculoskeletal: She exhibits no edema or tenderness.  Neurological: She is alert and oriented to person, place, and time.  Follows full commands.  Motor: LUE:4+/5 proximal to distal LLE: 4/5 HF,KE, 4+/5 ADF/PF RUE: 4-/5 proximal to distal RLE: HF, KE 4-/5, ADF/PF 4+/5  DTRs 3+ RUE/RLE  Skin: Skin is warm and dry.  Back dressing intact  Psychiatric: She has a normal mood and affect. Her behavior is normal.    Medical Problem List and Plan: 1.  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 No brace required. Discussed with neurosurgery when to resume aspirin 81 mg daily as prior to admission 2.  DVT Prophylaxis/Anticoagulation: SCDs. Check vascular study. 3. Pain Management: Robaxin and oxycodone as needed. Monitor with increased mobility 4. Mood: Paxil 20 mg daily, Provide emotional support 5. Neuropsych: This patient is capable of making decisions on her own behalf. 6. Skin/Wound Care: Routine skin checks 7. Fluids/Electrolytes/Nutrition: Routine I&O with follow-up chemistries 8. Hypertension. Norvasc 10 mg daily, Lopressor 100 mg twice a day. Monitor with increased mobility 9. SAH requiring surgery 11/29/2014 and also [place]. 10. History hypercoagulable state. INR 1.09. 11. Constipation. Laxative assistive  Post Admission Physician Evaluation: 1. Preadmission assessment reviewed and changes made below. 2. Functional deficits secondary  to large thoracolumbar spinal arachnoid cyst s/p laminectomy. 3. Patient is admitted to receive collaborative, interdisciplinary care between the physiatrist, rehab nursing staff, and therapy team. 4. Patient's level of medical complexity and  substantial therapy needs in context of that medical necessity cannot be provided at a lesser intensity of care such as a SNF. 5. Patient has experienced substantial functional loss from his/her baseline which was documented above under the "Functional History" and "Functional Status" headings.  Judging by the patient's diagnosis, physical exam, and functional history, the patient has potential for functional progress which will result in measurable gains while on inpatient rehab.  These gains will be of substantial and practical use upon discharge  in facilitating mobility and self-care at the household level. 6. Physiatrist will provide 24 hour management of medical needs as well as oversight of the therapy plan/treatment and provide guidance as appropriate regarding the interaction of the two. 7. The Preadmission Screening has been reviewed and patient status is unchanged unless otherwise stated above. 8. 24 hour rehab nursing will assist with bladder management, safety, skin/wound care, disease management, pain management and patient education  and help integrate therapy concepts, techniques,education, etc. 9. PT will assess and treat for/with: Lower extremity strength, range of motion, stamina, balance, functional mobility, safety, adaptive techniques and equipment, woundcare, coping skills, pain control, education.   Goals are: Supervision/Min A. 10. OT will assess and treat for/with: ADL's, functional mobility, safety, upper extremity strength, adaptive techniques and equipment, wound mgt, ego support, and community reintegration.   Goals are: supervision/Min A. Therapy may not proceed with showering this patient. 11. Case Management and Social Worker will assess and treat for psychological issues and discharge planning. 12. Team conference will be held weekly to assess progress toward goals and to determine barriers to discharge. 13. Patient will receive at least 3 hours of therapy per day  at least  5 days per week. 14. ELOS: 14-18 days. 15. Prognosis:  good  Delice Lesch, MD, Mellody Drown Cathlyn Parsons., PA-C 07/24/2016

## 2016-07-24 NOTE — Progress Notes (Signed)
Physical Therapy Treatment Patient Details Name: Katrina Donaldson MRN: [id number] DOB: [birth year] Today's Date: 07/24/2016    History of Present Illness Patient is a 61 y/o female admitted with LE weakness and imbalance due to arachnoid spinal cysts, now s/p T11-L1 laminectomy, fenestration of arachnoid cyst and T4-T7 laminectomy, fenestration of arachnoid cyst.  PMH positive for brain aneurysm, stroke [facility] 10/2014), HTN, DVT, hypothyroid    PT Comments    Patient progressing slowly due to pain, weakness, (noted did not eat anything on breakfast tray.)  Patient fatigued after one bout of ambulation, attempted to engage again, but too weak, fatigued.  Will continue skilled PT during acute stay.  Continue to recommend CIR rehab at d/c.   Follow Up Recommendations  CIR;Supervision/Assistance - 24 hour     Equipment Recommendations  Rolling walker with 5" wheels    Recommendations for Other Services       Precautions / Restrictions Precautions Precautions: Fall;Back    Mobility  Bed Mobility Overal bed mobility: Needs Assistance Bed Mobility: Rolling;Sidelying to Sit Rolling: Mod assist Sidelying to sit: Max assist       General bed mobility comments:  assist with rail to roll max cues and assist to bend opposite knee; assist for legs off bed and to lift trunk to come up to sitting  Transfers Overall transfer level: Needs assistance Equipment used: Rolling walker (2 wheeled) Transfers: Sit to/from Stand Sit to Stand: Max assist;+2 physical assistance         General transfer comment: assist and cues for anterior weight shift, cues for hand placement, assist for bringing hips forward and time to reach to walker, leaning posteriorly  Ambulation/Gait Ambulation/Gait assistance: Mod assist;+2 safety/equipment Ambulation Distance (Feet): 10 Feet Assistive device: Rolling walker (2 wheeled) Gait Pattern/deviations: Step-to pattern;Shuffle;Decreased stride length;Wide base of  support;Trunk flexed     General Gait Details: assist for hips forward to prevent LOB posterior, assist for moving walker, weight shift and cues for stepping, c/o UE fatigue and bringing chair closer, pt sat on corner of chair (controlled lowering to prevent falling)   Stairs            Wheelchair Mobility    Modified Rankin (Stroke Patients Only)       Balance Overall balance assessment: Needs assistance Sitting-balance support: Feet supported;Bilateral upper extremity supported Sitting balance-Leahy Scale: Poor Sitting balance - Comments: minguard with UE support for sitting balance   Standing balance support: Bilateral upper extremity supported Standing balance-Leahy Scale: Poor Standing balance comment: assist for keeping hips forward to prevent postrerior LOB                    Cognition Arousal/Alertness: Awake/alert Behavior During Therapy: Flat affect   Area of Impairment: Following commands;Problem solving       Following Commands: Follows one step commands with increased time     Problem Solving: Slow processing;Decreased initiation;Requires verbal cues;Requires tactile cues General Comments: slightly more engaging this session making comments and smiling    Exercises      General Comments        Pertinent Vitals/Pain Pain Assessment: Faces Faces Pain Scale: Hurts little more Pain Location: R shoulder burning with standing, gait Pain Descriptors / Indicators: Burning Pain Intervention(s): Repositioned;Monitored during session    Home Living                      Prior Function            PT  Goals (current goals can now be found in the care plan section) Progress towards PT goals: Progressing toward goals    Frequency    Min 5X/week      PT Plan Current plan remains appropriate    Co-evaluation             End of Session Equipment Utilized During Treatment: Gait belt Activity Tolerance: Patient limited by  fatigue Patient left: in chair;with call bell/phone within reach     Time: 0942-1010 PT Time Calculation (min) (ACUTE ONLY): 28 min  Charges:  $Gait Training: 8-22 mins $Therapeutic Activity: 8-22 mins                    G Codes:      Reginia Naas 08-06-16, [place], [place] Aug 06, 2016

## 2016-07-24 NOTE — Progress Notes (Signed)
Meredith Staggers, MD Physician Signed Physical Medicine and Rehabilitation  Consult Note Date of Service: 07/23/2016 12:45 PM  Related encounter: Admission (Discharged) from 07/19/2016 in [place] All Collapse All   [] Hide copied text [] Hover for attribution information      Physical Medicine and Rehabilitation Consult Reason for Consult: Spinal arachnoid cyst Referring Physician: Dr. Kathyrn Sheriff   HPI: Katrina Donaldson is a 61 y.o. right handed female with history of hypertension, hypercoagulable state, [facility] 11/29/2014 requiring surgery in [facility]. Per chart review patient independent with a cane prior to admission. Multilevel with 5 steps to entry. She has a sister and brother-in-law all and daughter recently moved into the downstairs of the home. Family in and out and cannot provide 24-hour care. Presented 07/19/2016 with gait disorder with decreased balance and some intermittent falls. 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 Underwent T11-L1 laminectomy, T4-T7 laminectomy, fenestration of arachnoid cyst 07/19/2016 per Dr. Kathyrn Sheriff. Hospital course pain management. No back brace required.   Review of Systems  Constitutional: Negative for chills and fever.  HENT: Negative for ear pain, hearing loss and tinnitus.   Eyes: Negative for blurred vision and double vision.  Respiratory: Negative for cough and shortness of breath.   Cardiovascular: Positive for leg swelling. Negative for chest pain and palpitations.  Gastrointestinal: Positive for constipation. Negative for nausea and vomiting.       GERD  Genitourinary: Positive for urgency. Negative for dysuria and hematuria.  Musculoskeletal: Positive for falls and myalgias.  Skin: Negative for rash.  Neurological: Positive for  tingling and weakness. Negative for seizures and loss of consciousness.  Psychiatric/Behavioral: Positive for depression.  All other systems reviewed and are negative.      Past Medical History:  Diagnosis Date  . Brain aneurysm   . Depression   . History of DVT (deep vein thrombosis)   . Hypercoagulable state ([place])   . Hypertension   . Hypothyroidism   . Stroke [facility] 2016   [facility] 11/29/14        Past Surgical History:  Procedure Laterality Date  . ABDOMINAL HYSTERECTOMY     patient denies  . BRAIN SURGERY     aneurysm  . goiter    . LAMINECTOMY N/A 07/19/2016   Procedure: LAMINECTOMY THORACIC FOUR THORACIC FIVE, THORACIC SIX TO THORACIC EIGHT,THORACIC TEN TO LUMBAR ONE, FENESTRATION OF ARACHNOID CYSTS;  Surgeon: Consuella Lose, MD;  Location: [facility];  Service: Neurosurgery;  Laterality: N/A;  . THYROIDECTOMY     History reviewed. No pertinent family history. Social History:  reports that she has never smoked. She has never used smokeless tobacco. She reports that she does not drink alcohol or use drugs. Allergies:       Allergies  Allergen Reactions  . Shrimp [Shellfish Allergy] Other (See Comments)    "tongue feels strange"         Medications Prior to Admission  Medication Sig Dispense Refill  . acetaminophen (TYLENOL) 500 MG tablet Take 500-1,000 mg by mouth every 6 (six) hours as needed (for pain).    Marland Kitchen amLODipine (NORVASC) 10 MG tablet Take 10 mg by mouth daily.    Marland Kitchen aspirin EC 81 MG tablet Take 81 mg by mouth daily.    . fluticasone (FLONASE) 50 MCG/ACT nasal spray Place 1 spray into both nostrils  daily.    . metoprolol (LOPRESSOR) 100 MG tablet Take 100 mg by mouth 2 (two) times daily.    Marland Kitchen omeprazole (PRILOSEC) 20 MG capsule Take 20 mg by mouth daily.    Marland Kitchen PARoxetine (PAXIL) 20 MG tablet Take 20 mg by mouth daily.    . simethicone (MYLICON) 80 MG chewable tablet Chew 80-160 mg by mouth every 6 (six) hours as needed for  flatulence.    . thiamine 250 MG tablet Take 125 mg by mouth daily.    . methocarbamol (ROBAXIN) 750 MG tablet Take 750 mg by mouth 3 (three) times daily as needed. For muscle spasms/back pain.      Home: Home Living Family/patient expects to be discharged to:: Inpatient rehab Living Arrangements: Alone, Other relatives (sister and brother in law and their daughter moved in downstairs) Available Help at Discharge: Family Home Access: Stairs to enter Entrance Stairs-Number of Steps: 5 Entrance Stairs-Rails: Right, Left Home Layout: Multi-level (split level) Alternate Level Stairs-Number of Steps: has bedroom on entry level Bathroom Shower/Tub: Tub/shower unit Home Equipment: Cane - single point, Grab bars - tub/shower  Functional History: Prior Function Level of Independence: Independent with assistive device(s) Comments: drove and cooked and cleaned,  some falls a few months ago Functional Status:  Mobility: Bed Mobility General bed mobility comments: up in recliner Transfers Overall transfer level: Needs assistance Equipment used: Rolling walker (2 wheeled) Transfers: Sit to/from Stand Sit to Stand: Max assist, +2 physical assistance General transfer comment: cues for anterior weight shift, hand placement, pushing up through UE's and LE's, increased time, use of momentum, heavy posterior bias with lifting and lowering assist needed Ambulation/Gait Ambulation/Gait assistance: Mod assist, +2 safety/equipment Ambulation Distance (Feet): 8 Feet Assistive device: Rolling walker (2 wheeled) Gait Pattern/deviations: Step-to pattern, Shuffle, Decreased stride length, Wide base of support, Trunk flexed General Gait Details: assist to keep hips forward to prevent posterior LOB, assist for weight shift, cues for stepping, assist for walker management  ADL:  Cognition: Cognition Overall Cognitive Status: Impaired/Different from baseline Orientation Level: Oriented  X4 Cognition Arousal/Alertness: Awake/alert Behavior During Therapy: Flat affect Overall Cognitive Status: Impaired/Different from baseline Area of Impairment: Following commands, Problem solving Following Commands: Follows one step commands with increased time Problem Solving: Slow processing, Decreased initiation  Blood pressure 114/77, pulse 82, temperature 98.1 F (36.7 C), temperature source Oral, resp. rate 16, height 5\' 5"  (1.651 m), weight 90.8 kg (200 lb 2.8 oz), SpO2 92 %. Physical Exam  Constitutional: She is oriented to person, place, and time.  HENT:  Head: Normocephalic.  Eyes:  Pupils round and reactive to light  Neck: Normal range of motion. Neck supple. No thyromegaly present.  Cardiovascular: Normal rate, regular rhythm and normal heart sounds.   Respiratory: Effort normal and breath sounds normal. No respiratory distress.  GI: Soft. Bowel sounds are normal. She exhibits no distension.  Neurological: She is alert and oriented to person, place, and time.  Follows full commands. UE 3-4/5 deltoids, 4/5 biceps, triceps, wrist and hands--?pain component. LE: 3/5 HF and KE and 4/5 ADF/PF. Senses pain in both LE's and trunk. DTR's 1+  Skin:  Patient sitting up in chair back incision not examined  Psychiatric: She has a normal mood and affect. Her behavior is normal. Judgment and thought content normal.    Lab Results Last 24 Hours  No results found for this or any previous visit (from the past 24 hour(s)).    Imaging Results (Last 48 hours)  Dg Abd Portable  1v  Result Date: 07/21/2016 CLINICAL DATA:  Vomiting.  Postop from thoracic laminectomy. EXAM: PORTABLE ABDOMEN - 1 VIEW COMPARISON:  None. FINDINGS: Generalized gaseous distention of small bowel and colon seen, consistent with mild ileus. No evidence of bowel obstruction. Lower thoracic laminectomies and skin staples noted . IMPRESSION: Mild postop ileus . Electronically Signed   By: Earle Gell M.D.   On:  07/21/2016 14:33     Assessment/Plan: Diagnosis: large thoraco-lumbar spinal cyst with myelopathy 1. Does the need for close, 24 hr/day medical supervision in concert with the patient's rehab needs make it unreasonable for this patient to be served in a less intensive setting? Yes 2. Co-Morbidities requiring supervision/potential complications: hypertension and blood pressure control while mobilizing, wound care, pain mgt, anemia 3. Due to bladder management, bowel management, safety, skin/wound care, disease management, medication administration, pain management and patient education, does the patient require 24 hr/day rehab nursing? Yes 4. Does the patient require coordinated care of a physician, rehab nurse, PT (1-2 hrs/day, 5 days/week) and OT (1-2 hrs/day, 5 days/week) to address physical and functional deficits in the context of the above medical diagnosis(es)? Yes Addressing deficits in the following areas: balance, endurance, locomotion, strength, transferring, bowel/bladder control, bathing, dressing, feeding, grooming, toileting and psychosocial support 5. Can the patient actively participate in an intensive therapy program of at least 3 hrs of therapy per day at least 5 days per week? Yes 6. The potential for patient to make measurable gains while on inpatient rehab is excellent 7. Anticipated functional outcomes upon discharge from inpatient rehab are modified independent and supervision  with PT, modified independent and supervision with OT, n/a with SLP. 8. Estimated rehab length of stay to reach the above functional goals is: 12-16 days 9. Does the patient have adequate social supports and living environment to accommodate these discharge functional goals? Yes 10. Anticipated D/C setting: Home 11. Anticipated post D/C treatments: HH therapy and Outpatient therapy 12. Overall Rehab/Functional Prognosis: excellent  RECOMMENDATIONS: This patient's condition is appropriate for  continued rehabilitative care in the following setting: CIR Patient has agreed to participate in recommended program. Yes Note that insurance prior authorization may be required for reimbursement for recommended care.  Comment: Rehab Admissions Coordinator to follow up.  Thanks,  Meredith Staggers, MD, Mellody Drown    Cathlyn Parsons., PA-C 07/23/2016    Revision History

## 2016-07-24 NOTE — Discharge Summary (Signed)
Physician Discharge Summary  Patient ID: Katrina Donaldson MRN: [id number] DOB/AGE: [id number] 77 y.o.  Admit date: 07/19/2016 Discharge date: 07/24/2016  Admission Diagnoses:  Spinal arachnoid cyst  Discharge Diagnoses:  Same Active Problems:   Spinal arachnoid cyst   Thoracic myelopathy   Depression   Benign essential HTN   History of subarachnoid hemorrhage   Hypercoagulable state ([place])   Constipation due to pain medication   Discharged Condition: Stable  Hospital Course:  Katrina Donaldson is a 61 y.o. female electively admitted after multilevel laminectomy for fenestration of arachnoid cysts. She was flat for 48 hrs postop and was then mobilized. She had appropriate back pain. No evidence of CSF leak. Eval by PT and OT found her to be a good candidate for CIR. She was therefore transferred in stable condition.  Treatments: Surgery - Multilevel laminectomy T4-7, T11-L1 for fenestration of arachnoid cyst  Discharge Exam: Blood pressure 119/81, pulse (!) 108, temperature 98.9 F (37.2 C), temperature source Oral, resp. rate 18, height 5\' 5"  (1.651 m), weight 90.8 kg (200 lb 2.8 oz), SpO2 93 %. Awake, alert, oriented Speech fluent, appropriate CN grossly intact 5/5 BUE 4/5 BLE Wound c/d/i  Disposition: CIR  Discharge Instructions    Call MD for:  redness, tenderness, or signs of infection (pain, swelling, redness, odor or green/yellow discharge around incision site)    Complete by:  As directed    Call MD for:  temperature >100.4    Complete by:  As directed    Diet - low sodium heart healthy    Complete by:  As directed    Discharge instructions    Complete by:  As directed    Walk at home as much as possible, at least 4 times / day   Increase activity slowly    Complete by:  As directed    Lifting restrictions    Complete by:  As directed    No lifting > 10 lbs   May shower / Bathe    Complete by:  As directed    48 hours after surgery   May walk up steps     Complete by:  As directed    No dressing needed    Complete by:  As directed    Other Restrictions    Complete by:  As directed    No bending/twisting at waist     Allergies as of 07/24/2016      Reactions   Shrimp [shellfish Allergy] Other (See Comments)   "tongue feels strange"      Medication List    TAKE these medications   acetaminophen 500 MG tablet Commonly known as:  TYLENOL Take 500-1,000 mg by mouth every 6 (six) hours as needed (for pain).   amLODipine 10 MG tablet Commonly known as:  NORVASC Take 10 mg by mouth daily.   aspirin EC 81 MG tablet Take 81 mg by mouth daily.   fluticasone 50 MCG/ACT nasal spray Commonly known as:  FLONASE Place 1 spray into both nostrils daily.   methocarbamol 750 MG tablet Commonly known as:  ROBAXIN Take 750 mg by mouth 3 (three) times daily as needed. For muscle spasms/back pain.   metoprolol 100 MG tablet Commonly known as:  LOPRESSOR Take 100 mg by mouth 2 (two) times daily.   omeprazole 20 MG capsule Commonly known as:  PRILOSEC Take 20 mg by mouth daily.   oxyCODONE-acetaminophen 5-325 MG tablet Commonly known as:  PERCOCET/ROXICET Take 1-2 tablets by mouth every 4 (four)  hours as needed for moderate pain.   PARoxetine 20 MG tablet Commonly known as:  PAXIL Take 20 mg by mouth daily.   simethicone 80 MG chewable tablet Commonly known as:  MYLICON Chew 99991111 mg by mouth every 6 (six) hours as needed for flatulence.   thiamine 250 MG tablet Take 125 mg by mouth daily.      Follow-up Information    Dierdre Mccalip, C, MD Follow up in 3 week(s).   Specialty:  Neurosurgery Contact information: [address] [place] [address] [place] [address] [phone]           Signed: Jairo Ben 07/24/2016, 5:26 PM

## 2016-07-24 NOTE — Progress Notes (Signed)
I met with pt at bedside to discuss her options for rehab venues. She prefers an inpt rehab admission rather than SNF if insurance approves. I will initiate [employer] approval process. Rehab venue will be dependent on insurance approval and bed availability. I will follow up with pt and daughter when daughter arrives today. [phone]

## 2016-07-24 NOTE — Progress Notes (Signed)
Pt arrived to unit with family at bedside. Pt alert and oriented and comfortable. Report given to night shift nurse .

## 2016-07-24 NOTE — Progress Notes (Signed)
No issues overnight. No significant complaints this am.  EXAM:  BP 122/79 (BP Location: Right Arm)   Pulse 85   Temp 98.9 F (37.2 C) (Oral)   Resp 18   Ht 5\' 5"  (1.651 m)   Wt 90.8 kg (200 lb 2.8 oz)   SpO2 93%   BMI 33.31 kg/m   Awake, alert, oriented  Speech fluent, appropriate  CN grossly intact  5/5 BUE Mild BLE weakness 4/5 Wound c/d/i  IMPRESSION:  61 y.o. female s/p fenestration of spinal arachnoid cysts. Recovering as expected.  PLAN: - Can transfer to CIR when bed available - Cont to mobilize

## 2016-07-24 NOTE — Progress Notes (Signed)
Cristina Gong, RN Rehab Admission Coordinator Signed Physical Medicine and Rehabilitation  PMR Pre-admission Date of Service: 07/24/2016 3:51 PM  Related encounter: Admission (Discharged) from 07/19/2016 in [place]       [] Hide copied text PMR Admission Coordinator Pre-Admission Assessment  Patient: Katrina Donaldson is an 61 y.o., female MRN: [id number] DOB: 1956-02-06 Height: 5\' 5"  (165.1 cm) Weight: 90.8 kg (200 lb 2.8 oz)                                                                                                                                                  Insurance Information HMO:     PPO:      PCP:      IPA:      80/20:      OTHER:  PRIMARY: [place] [employer] [place]      Policy#: [id number]      Subscriber: pt CM Name: Philis Fendt    Phone#: [phone]     Fax#: [phone] Pre-Cert#: [id number]      Employer: retired updated due 07/31/16 Benefits:  Phone #: [phone]     Name: 07/24/16 Eff. Date: 07/01/16     Deduct: $1080      Out of Pocket Max: $5468      Life Max: none CIR: $337 co pay per admit then covers 70%      SNF: 70% 100 days Outpatient: 70%     Co-Pay: visits per medical neccesity Home Health: 70%      Co-Pay: visits per medical neccesity DME: 70%     Co-Pay: 30% Providers: in network  SECONDARY: none        Medicaid Application Date:       Case Manager:  Disability Application Date:       Case Worker:   Emergency Contact Information        Contact Information    Name Relation Home Work [place] Daughter   [phone]   Robinson,Betty Sister   [phone]     Current Medical History  Patient Admitting Diagnosis: large thoraco-lumbar spinal cyst with myelopathy  History of Present Illness: HPI: Katrina Donaldson a 61 y.o.right handed femalewith history of hypertension, hypercoagulable state, [facility] 11/29/2014 requiring [place]. Presented 07/19/2016 with  gait disorder with decreased balance and some intermittent falls. MRI cervical thoracic and lumbar spine demonstrated large loculated arachnoid cyst with spinal cord compressionextending from the top of the thoracic spine to the upper lumbar spine.Underwent T11-L1 laminectomy, T4-T7 laminectomy, fenestration of arachnoid cyst 07/19/2016 per Dr. Kathyrn Sheriff. Hospital course pain management. No back brace required.   Past Medical History      Past Medical History:  Diagnosis Date  . Brain aneurysm   . Depression   . History of DVT (deep vein thrombosis)   . Hypercoagulable state ([place])   .  Hypertension   . Hypothyroidism   . Stroke [facility]) 2016   [facility] 11/29/14    Family History  family history is not on file.  Prior Rehab/Hospitalizations:  Has the patient had major surgery during 100 days prior to admission? No Was at [facility] for about 3 weeks after CVA 2016  Current Medications   Current Facility-Administered Medications:  .  0.9 %  sodium chloride infusion, , Intravenous, Continuous, Consuella Lose, MD, Stopped at 07/21/16 2003 .  0.9 %  sodium chloride infusion, 250 mL, Intravenous, Continuous, Consuella Lose, MD .  acetaminophen (TYLENOL) tablet 650 mg, 650 mg, Oral, Q4H PRN, 650 mg at 07/20/16 1837 **OR** acetaminophen (TYLENOL) suppository 650 mg, 650 mg, Rectal, Q4H PRN, Consuella Lose, MD, 650 mg at 07/21/16 1214 .  amLODipine (NORVASC) tablet 10 mg, 10 mg, Oral, Daily, Consuella Lose, MD, 10 mg at 07/24/16 1113 .  bisacodyl (DULCOLAX) suppository 10 mg, 10 mg, Rectal, Daily PRN, Consuella Lose, MD .  docusate sodium (COLACE) capsule 100 mg, 100 mg, Oral, BID, Consuella Lose, MD, 100 mg at 07/24/16 1105 .  feeding supplement (ENSURE ENLIVE) (ENSURE ENLIVE) liquid 237 mL, 237 mL, Oral, TID BM, Consuella Lose, MD, 237 mL at 07/24/16 1102 .  fluticasone (FLONASE) 50 MCG/ACT nasal spray 1 spray, 1 spray, Each Nare, Daily, Consuella Lose, MD, 1  spray at 07/23/16 1125 .  HYDROmorphone (DILAUDID) injection 0.5-1 mg, 0.5-1 mg, Intravenous, Q2H PRN, Consuella Lose, MD, 1 mg at 07/21/16 2138 .  menthol-cetylpyridinium (CEPACOL) lozenge 3 mg, 1 lozenge, Oral, PRN **OR** phenol (CHLORASEPTIC) mouth spray 1 spray, 1 spray, Mouth/Throat, PRN, Consuella Lose, MD .  methocarbamol (ROBAXIN) tablet 500 mg, 500 mg, Oral, Q6H PRN, 500 mg at 07/23/16 2136 **OR** methocarbamol (ROBAXIN) 500 mg in dextrose 5 % 50 mL IVPB, 500 mg, Intravenous, Q6H PRN, Consuella Lose, MD .  metoprolol (LOPRESSOR) tablet 100 mg, 100 mg, Oral, BID, Consuella Lose, MD, 100 mg at 07/24/16 1106 .  multivitamin with minerals tablet 1 tablet, 1 tablet, Oral, Daily, Consuella Lose, MD, 1 tablet at 07/24/16 1105 .  naphazoline-glycerin (CLEAR EYES) ophth solution 1-2 drop, 1-2 drop, Both Eyes, QID PRN, Consuella Lose, MD, 2 drop at 07/24/16 1115 .  ondansetron (ZOFRAN) injection 4 mg, 4 mg, Intravenous, Q4H PRN, Consuella Lose, MD, 4 mg at 07/21/16 1157 .  oxyCODONE-acetaminophen (PERCOCET/ROXICET) 5-325 MG per tablet 1-2 tablet, 1-2 tablet, Oral, Q4H PRN, Consuella Lose, MD, 2 tablet at 07/23/16 2136 .  pantoprazole (PROTONIX) EC tablet 40 mg, 40 mg, Oral, BID, Rebecka Apley, RPH, 40 mg at 07/24/16 1113 .  PARoxetine (PAXIL) tablet 20 mg, 20 mg, Oral, Daily, Consuella Lose, MD, 20 mg at 07/24/16 1105 .  senna (SENOKOT) tablet 8.6 mg, 1 tablet, Oral, BID, Consuella Lose, MD, 8.6 mg at 07/24/16 1112 .  simethicone (MYLICON) chewable tablet 80-160 mg, 80-160 mg, Oral, Q6H PRN, Consuella Lose, MD, 160 mg at 07/20/16 2013 .  sodium chloride flush (NS) 0.9 % injection 3 mL, 3 mL, Intravenous, Q12H, Consuella Lose, MD, 3 mL at 07/24/16 1115 .  sodium chloride flush (NS) 0.9 % injection 3 mL, 3 mL, Intravenous, PRN, Consuella Lose, MD .  thiamine (VITAMIN B-1) tablet 100 mg, 100 mg, Oral, Daily, Consuella Lose, MD, 100 mg at 07/24/16 1104  Patients  Current Diet: Diet regular Room service appropriate? Yes; Fluid consistency: Thin  Precautions / Restrictions Precautions Precautions: Fall, Back Restrictions Weight Bearing Restrictions: No   Has the patient had 2 or more falls  or a fall with injury in the past year?No pt reports no falls in last 6 months; reports near misses where she sat down in her chair to prevent a fall  Prior Activity Level Community (5-7x/wk): Mod I with straight cane  Home Assistive Devices / Equipment Home Assistive Devices/Equipment: Cane (specify quad or straight), Walker (specify type) Home Equipment: Cane - single point, Grab bars - tub/shower  Prior Device Use: Indicate devices/aids used by the patient prior to current illness, exacerbation or injury? cane  Prior Functional Level Prior Function Level of Independence: Independent with assistive device(s) Comments: drove and cooked and cleaned,  some falls a few months ago  Self Care: Did the patient need help bathing, dressing, using the toilet or eating?  Independent  Indoor Mobility: Did the patient need assistance with walking from room to room (with or without device)? Independent  Stairs: Did the patient need assistance with internal or external stairs (with or without device)? Independent  Functional Cognition: Did the patient need help planning regular tasks such as shopping or remembering to take medications? Independent  Current Functional Level Cognition  Overall Cognitive Status: Impaired/Different from baseline Orientation Level: Oriented X4 Following Commands: Follows one step commands with increased time General Comments: slightly more engaging this session making comments and smiling    Extremity Assessment (includes Sensation/Coordination)  Upper Extremity Assessment: RUE deficits/detail, LUE deficits/detail RUE Deficits / Details: Shoulder strength=3/5, elbow to distal strength=4/5. Pt with overall decreased  coordination in RUE LUE Deficits / Details: Shoulder strength=2/5, elbow to distal strength=3+/5. Pt with overall decreased coordination in LUE  Lower Extremity Assessment: Defer to PT evaluation RLE Deficits / Details: AAROM WFL, strength hip flexion 2/5, knee extension 4-/5, ankle DF 4+/5, reports sensation intact to light touch LLE Deficits / Details: AAROM WFL, strength hip flexion 2-/5, knee extension 3+/5, ankle DF 4+/5, reports sensation intact to light touch    ADLs  Overall ADL's : Needs assistance/impaired Eating/Feeding: Set up, Sitting Grooming: Minimal assistance, Sitting Upper Body Bathing: Maximal assistance, Sitting Lower Body Bathing: Total assistance, Sit to/from stand, +2 for physical assistance Upper Body Dressing : Moderate assistance, Sitting Lower Body Dressing: Total assistance, Sit to/from stand, +2 for physical assistance Toilet Transfer Details (indicate cue type and reason): did not occur, anticipate pt will require max assist +2 for toilet transfers Gerber and Hygiene: Total assistance, Sit to/from stand Toileting - Clothing Manipulation Details (indicate cue type and reason): did not occur, anticipate pt will require max assist +2 for toileting Tub/Shower Transfer Details (indicate cue type and reason): did not attempt, not appropriate at this time General ADL Comments: Overall pt takes increased time. According to pt she was "slow" PTA.     Mobility  Overal bed mobility: Needs Assistance Bed Mobility: Rolling, Sidelying to Sit Rolling: Mod assist Sidelying to sit: Max assist General bed mobility comments:  assist with rail to roll max cues and assist to bend opposite knee; assist for legs off bed and to lift trunk to come up to sitting    Transfers  Overall transfer level: Needs assistance Equipment used: Rolling walker (2 wheeled) Transfers: Sit to/from Stand Sit to Stand: Max assist, +2 physical assistance General  transfer comment: assist and cues for anterior weight shift, cues for hand placement, assist for bringing hips forward and time to reach to walker, leaning posteriorly    Ambulation / Gait / Stairs / Wheelchair Mobility  Ambulation/Gait Ambulation/Gait assistance: Mod assist, +2 safety/equipment Ambulation Distance (Feet): 10 Feet  Assistive device: Rolling walker (2 wheeled) Gait Pattern/deviations: Step-to pattern, Shuffle, Decreased stride length, Wide base of support, Trunk flexed General Gait Details: assist for hips forward to prevent LOB posterior, assist for moving walker, weight shift and cues for stepping, c/o UE fatigue and bringing chair closer, pt sat on corner of chair (controlled lowering to prevent falling)    Posture / Balance Dynamic Sitting Balance Sitting balance - Comments: minguard with UE support for sitting balance Balance Overall balance assessment: Needs assistance Sitting-balance support: Feet supported, Bilateral upper extremity supported Sitting balance-Leahy Scale: Poor Sitting balance - Comments: minguard with UE support for sitting balance Postural control: Posterior lean Standing balance support: Bilateral upper extremity supported Standing balance-Leahy Scale: Poor Standing balance comment: assist for keeping hips forward to prevent postrerior LOB    Special needs/care consideration BiPAP/CPAP  N/a CPM  N/a  Continuous Drip IV  N/a Dialysis n/a Life Vest n/a Oxygen n/a Special Bed n/a Trach Size n/a Wound Vac (area) n/a Skin intact Bowel mgmt: continent Bladder mgmt: continent; foul odored Diabetic mgmt  N/a   Previous Home Environment Living Arrangements: Other relatives (Pt's sister and brother in law moved in with pt in July)  Lives With: Family (sister and brother in law moved in with pt in July) Available Help at Discharge: Family, Available 24 hours/day (brother in law with pt when sister is at work 1130 am until ) Type of Home:  House Home Layout: Two level, Full bath on main level, Other (Comment) (one level with basement. Sister lives in basement) Alternate Level Stairs-Number of Steps: has bedrrom and full bath on main level Home Access: Stairs to enter Entrance Stairs-Rails: Right, Left Entrance Stairs-Number of Steps: 5 Bathroom Shower/Tub: Tub/shower unit, Architectural technologist: Handicapped height Bathroom Accessibility: Yes How Accessible: Accessible via walker [place]: No  Discharge Living Setting Plans for Discharge Living Setting: Patient's home, Other (Comment) (pt's sister and brother in law moved in with pt in [place]) Type of Home at Discharge: House Discharge Home Layout: Two level, Full bath on main level, Able to live on main level with bedroom/bathroom (basement is where sister and brother in law live) Discharge Home Access: Stairs to enter Entrance Stairs-Rails: Right, Left, Can reach both Entrance Stairs-Number of Steps: 5 Discharge Bathroom Shower/Tub: Tub/shower unit, Curtain Discharge Bathroom Toilet: Handicapped height Discharge Bathroom Accessibility: Yes How Accessible: Accessible via walker Does the patient have any problems obtaining your medications?: No  Social/Family/Support Systems Patient Roles: Parent Anticipated Caregiver: sister and other family members Anticipated Caregiver's Contact Information: see above Ability/Limitations of Caregiver: sister works 86 am until 37 pm. Brother in Sports coach in the home with the pt otherwise Caregiver Availability: 24/7 Discharge Plan Discussed with Primary Caregiver: Yes Is Caregiver In Agreement with Plan?: Yes Does Caregiver/Family have Issues with Lodging/Transportation while Pt is in Rehab?: No  Daughter states she is worried about sister, Inez Catalina, in pt's home. Not reliable. Pt has 11 living siblings. Daughter is in [place] but local during pt's admission. She requests that we keep her updated on pt needs so she can assist  with planning if pt will need supervision in the home.   Goals/Additional Needs Patient/Family Goal for Rehab: Mod I to supervision with PT and OT Expected length of stay: [facility] 12-16 days Pt/Family Agrees to Admission and willing to participate: Yes Program Orientation Provided & Reviewed with Pt/Caregiver Including Roles  & Responsibilities: Yes  Decrease burden of Care through IP rehab admission: n/a  Possible need for  SNF placement upon discharge: I discussed with pt that [facility] will not likely pay for SNF after a CIR admit.She feels family can assist as needed in the home.  Patient Condition: This patient's condition remains as documented in the consult dated 07/23/2016, in which the Rehabilitation Physician determined and documented that the patient's condition is appropriate for intensive rehabilitative care in an inpatient rehabilitation facility. Will admit to inpatient rehab today.  Preadmission Screen Completed By:  Cleatrice Burke, 07/24/2016 4:42 PM ______________________________________________________________________   Discussed status with Dr. Posey Pronto on 07/24/2016 at  1645 and received telephone approval for admission today.  Admission Coordinator:  Cleatrice Burke, time [id number] Date 07/24/2016.       Cosigned by: Ankit Lorie Phenix, MD at 07/24/2016 4:51 PM  Revision History

## 2016-07-24 NOTE — PMR Pre-admission (Signed)
PMR Admission Coordinator Pre-Admission Assessment  Patient: Katrina Donaldson is an 61 y.o., female MRN: [id number] DOB: [birth year] Height: 5\' 5"  (165.1 cm) Weight: 90.8 kg (200 lb 2.8 oz)              Insurance Information HMO:     PPO:      PCP:      IPA:      80/20:      OTHER:  PRIMARY: [place] [employer] [place]      Policy#: [id number]      Subscriber: pt CM Name: Katrina Donaldson    Phone#: [phone]     Fax#: [phone] Pre-Cert#: [id number]      Employer: retired updated due 07/31/16 Benefits:  Phone #: [phone]     Name: 07/24/16 Eff. Date: 07/01/16     Deduct: $1080      Out of Pocket Max: $5468      Life Max: none CIR: $337 co pay per admit then covers 70%      SNF: 70% 100 days Outpatient: 70%     Co-Pay: visits per medical neccesity Home Health: 70%      Co-Pay: visits per medical neccesity DME: 70%     Co-Pay: 30% Providers: in network  SECONDARY: none        Medicaid Application Date:       Case Manager:  Disability Application Date:       Case Worker:   Emergency Contact Information Contact Information    Name Relation Home Work Katrina Donaldson Daughter   [phone]   Katrina Donaldson Sister   [phone]     Current Medical History  Patient Admitting Diagnosis: large thoraco-lumbar spinal cyst with myelopathy  History of Present Illness: HPI: Katrina Donaldson a 61 y.o.right handed femalewith history of hypertension, hypercoagulable state, [facility] 11/29/2014 requiring [place]. Presented 07/19/2016 with gait disorder with decreased balance and some intermittent falls. MRI cervical thoracic and lumbar spine demonstrated large loculated arachnoid cyst with spinal cord compressionextending from the top of the thoracic spine to the upper lumbar spine.Underwent T11-L1 laminectomy, T4-T7 laminectomy, fenestration of arachnoid cyst 07/19/2016 per Dr. Kathyrn Donaldson. Hospital course pain management. No back brace required.    Past Medical History   Past Medical History:  Diagnosis Date  . Brain aneurysm   . Depression   . History of DVT (deep vein thrombosis)   . Hypercoagulable state ([place])   . Hypertension   . Hypothyroidism   . Stroke [facility] 2016   [facility] 11/29/14    Family History  family history is not on file.  Prior Rehab/Hospitalizations:  Has the patient had major surgery during 100 days prior to admission? No Was at [facility] for about 3 weeks after CVA 2016  Current Medications   Current Facility-Administered Medications:  .  0.9 %  sodium chloride infusion, , Intravenous, Continuous, Katrina Lose, MD, Stopped at 07/21/16 2003 .  0.9 %  sodium chloride infusion, 250 mL, Intravenous, Continuous, Katrina Lose, MD .  acetaminophen (TYLENOL) tablet 650 mg, 650 mg, Oral, Q4H PRN, 650 mg at 07/20/16 1837 **OR** acetaminophen (TYLENOL) suppository 650 mg, 650 mg, Rectal, Q4H PRN, Katrina Lose, MD, 650 mg at 07/21/16 1214 .  amLODipine (NORVASC) tablet 10 mg, 10 mg, Oral, Daily, Katrina Lose, MD, 10 mg at 07/24/16 1113 .  bisacodyl (DULCOLAX) suppository 10 mg, 10 mg, Rectal, Daily PRN, Katrina Lose, MD .  docusate sodium (COLACE) capsule 100 mg, 100 mg, Oral, BID, Katrina Lose, MD, 100 mg at  07/24/16 1105 .  feeding supplement (ENSURE ENLIVE) (ENSURE ENLIVE) liquid 237 mL, 237 mL, Oral, TID BM, Katrina Lose, MD, 237 mL at 07/24/16 1102 .  fluticasone (FLONASE) 50 MCG/ACT nasal spray 1 spray, 1 spray, Each Nare, Daily, Katrina Lose, MD, 1 spray at 07/23/16 1125 .  HYDROmorphone (DILAUDID) injection 0.5-1 mg, 0.5-1 mg, Intravenous, Q2H PRN, Katrina Lose, MD, 1 mg at 07/21/16 2138 .  menthol-cetylpyridinium (CEPACOL) lozenge 3 mg, 1 lozenge, Oral, PRN **OR** phenol (CHLORASEPTIC) mouth spray 1 spray, 1 spray, Mouth/Throat, PRN, Katrina Lose, MD .  methocarbamol (ROBAXIN) tablet 500 mg, 500 mg, Oral, Q6H PRN, 500 mg at 07/23/16 2136 **OR** methocarbamol (ROBAXIN) 500 mg in dextrose  5 % 50 mL IVPB, 500 mg, Intravenous, Q6H PRN, Katrina Lose, MD .  metoprolol (LOPRESSOR) tablet 100 mg, 100 mg, Oral, BID, Katrina Lose, MD, 100 mg at 07/24/16 1106 .  multivitamin with minerals tablet 1 tablet, 1 tablet, Oral, Daily, Katrina Lose, MD, 1 tablet at 07/24/16 1105 .  naphazoline-glycerin (CLEAR EYES) ophth solution 1-2 drop, 1-2 drop, Both Eyes, QID PRN, Katrina Lose, MD, 2 drop at 07/24/16 1115 .  ondansetron (ZOFRAN) injection 4 mg, 4 mg, Intravenous, Q4H PRN, Katrina Lose, MD, 4 mg at 07/21/16 1157 .  oxyCODONE-acetaminophen (PERCOCET/ROXICET) 5-325 MG per tablet 1-2 tablet, 1-2 tablet, Oral, Q4H PRN, Katrina Lose, MD, 2 tablet at 07/23/16 2136 .  pantoprazole (PROTONIX) EC tablet 40 mg, 40 mg, Oral, BID, Katrina Donaldson, Katrina Donaldson, 40 mg at 07/24/16 1113 .  PARoxetine (PAXIL) tablet 20 mg, 20 mg, Oral, Daily, Katrina Lose, MD, 20 mg at 07/24/16 1105 .  senna (SENOKOT) tablet 8.6 mg, 1 tablet, Oral, BID, Katrina Lose, MD, 8.6 mg at 07/24/16 1112 .  simethicone (MYLICON) chewable tablet 80-160 mg, 80-160 mg, Oral, Q6H PRN, Katrina Lose, MD, 160 mg at 07/20/16 2013 .  sodium chloride flush (NS) 0.9 % injection 3 mL, 3 mL, Intravenous, Q12H, Katrina Lose, MD, 3 mL at 07/24/16 1115 .  sodium chloride flush (NS) 0.9 % injection 3 mL, 3 mL, Intravenous, PRN, Katrina Lose, MD .  thiamine (VITAMIN B-1) tablet 100 mg, 100 mg, Oral, Daily, Katrina Lose, MD, 100 mg at 07/24/16 1104  Patients Current Diet: Diet regular Room service appropriate? Yes; Fluid consistency: Thin  Precautions / Restrictions Precautions Precautions: Fall, Back Restrictions Weight Bearing Restrictions: No   Has the patient had 2 or more falls or a fall with injury in the past year?No pt reports no falls in last 6 months; reports near misses where she sat down in her chair to prevent a fall  Prior Activity Level Community (5-7x/wk): Mod I with straight  cane  Home Assistive Devices / Equipment Home Assistive Devices/Equipment: Cane (specify quad or straight), Walker (specify type) Home Equipment: Cane - single point, Grab bars - tub/shower  Prior Device Use: Indicate devices/aids used by the patient prior to current illness, exacerbation or injury? cane  Prior Functional Level Prior Function Level of Independence: Independent with assistive device(s) Comments: drove and cooked and cleaned,  some falls a few months ago  Self Care: Did the patient need help bathing, dressing, using the toilet or eating?  Independent  Indoor Mobility: Did the patient need assistance with walking from room to room (with or without device)? Independent  Stairs: Did the patient need assistance with internal or external stairs (with or without device)? Independent  Functional Cognition: Did the patient need help planning regular tasks such as shopping or remembering to take medications?  Independent  Current Functional Level Cognition  Overall Cognitive Status: Impaired/Different from baseline Orientation Level: Oriented X4 Following Commands: Follows one step commands with increased time General Comments: slightly more engaging this session making comments and smiling    Extremity Assessment (includes Sensation/Coordination)  Upper Extremity Assessment: RUE deficits/detail, LUE deficits/detail RUE Deficits / Details: Shoulder strength=3/5, elbow to distal strength=4/5. Pt with overall decreased coordination in RUE LUE Deficits / Details: Shoulder strength=2/5, elbow to distal strength=3+/5. Pt with overall decreased coordination in LUE  Lower Extremity Assessment: Defer to PT evaluation RLE Deficits / Details: AAROM WFL, strength hip flexion 2/5, knee extension 4-/5, ankle DF 4+/5, reports sensation intact to light touch LLE Deficits / Details: AAROM WFL, strength hip flexion 2-/5, knee extension 3+/5, ankle DF 4+/5, reports sensation intact to light  touch    ADLs  Overall ADL's : Needs assistance/impaired Eating/Feeding: Set up, Sitting Grooming: Minimal assistance, Sitting Upper Body Bathing: Maximal assistance, Sitting Lower Body Bathing: Total assistance, Sit to/from stand, +2 for physical assistance Upper Body Dressing : Moderate assistance, Sitting Lower Body Dressing: Total assistance, Sit to/from stand, +2 for physical assistance Toilet Transfer Details (indicate cue type and reason): did not occur, anticipate pt will require max assist +2 for toilet transfers [place] and Hygiene: Total assistance, Sit to/from stand Toileting - Clothing Manipulation Details (indicate cue type and reason): did not occur, anticipate pt will require max assist +2 for toileting Tub/Shower Transfer Details (indicate cue type and reason): did not attempt, not appropriate at this time General ADL Comments: Overall pt takes increased time. According to pt she was "slow" PTA.     Mobility  Overal bed mobility: Needs Assistance Bed Mobility: Rolling, Sidelying to Sit Rolling: Mod assist Sidelying to sit: Max assist General bed mobility comments:  assist with rail to roll max cues and assist to bend opposite knee; assist for legs off bed and to lift trunk to come up to sitting    Transfers  Overall transfer level: Needs assistance Equipment used: Rolling walker (2 wheeled) Transfers: Sit to/from Stand Sit to Stand: Max assist, +2 physical assistance General transfer comment: assist and cues for anterior weight shift, cues for hand placement, assist for bringing hips forward and time to reach to walker, leaning posteriorly    Ambulation / Gait / Stairs / Wheelchair Mobility  Ambulation/Gait Ambulation/Gait assistance: Mod assist, +2 safety/equipment Ambulation Distance (Feet): 10 Feet Assistive device: Rolling walker (2 wheeled) Gait Pattern/deviations: Step-to pattern, Shuffle, Decreased stride length, Wide base of  support, Trunk flexed General Gait Details: assist for hips forward to prevent LOB posterior, assist for moving walker, weight shift and cues for stepping, c/o UE fatigue and bringing chair closer, pt sat on corner of chair (controlled lowering to prevent falling)    Posture / Balance Dynamic Sitting Balance Sitting balance - Comments: minguard with UE support for sitting balance Balance Overall balance assessment: Needs assistance Sitting-balance support: Feet supported, Bilateral upper extremity supported Sitting balance-Leahy Scale: Poor Sitting balance - Comments: minguard with UE support for sitting balance Postural control: Posterior lean Standing balance support: Bilateral upper extremity supported Standing balance-Leahy Scale: Poor Standing balance comment: assist for keeping hips forward to prevent postrerior LOB    Special needs/care consideration BiPAP/CPAP  N/a CPM  N/a  Continuous Drip IV  N/a Dialysis n/a Life Vest n/a Oxygen n/a Special Bed n/a Trach Size n/a Wound Vac (area) n/a Skin intact Bowel mgmt: continent Bladder mgmt: continent; foul odored Diabetic mgmt  N/a  Previous Home Environment Living Arrangements: Other relatives (Pt's sister and brother in law moved in with pt in July)  Lives With: Family (sister and brother in law moved in with pt in July) Available Help at Discharge: Family, Available 24 hours/day (brother in law with pt when sister is at work 1130 am until ) Type of Home: [place]: Two level, Full bath on main level, Other (Comment) (one level with basement. Sister lives in basement) Alternate Level Stairs-Number of Steps: has bedrrom and full bath on main level Home Access: Stairs to enter Entrance Stairs-Rails: Right, Left Entrance Stairs-Number of Steps: 5 Bathroom Shower/Tub: Tub/shower unit, Architectural technologist: Handicapped height Bathroom Accessibility: Yes How Accessible: Accessible via walker Steen:  No  Discharge Living Setting Plans for Discharge Living Setting: Patient's home, Other (Comment) (pt's sister and brother in law moved in with pt in [place]) Type of Home at Discharge: House Discharge Home Layout: Two level, Full bath on main level, Able to live on main level with bedroom/bathroom (basement is where sister and brother in law live) Discharge Home Access: Stairs to enter Entrance Stairs-Rails: Right, Left, Can reach both Entrance Stairs-Number of Steps: 5 Discharge Bathroom Shower/Tub: Tub/shower unit, Curtain Discharge Bathroom Toilet: Handicapped height Discharge Bathroom Accessibility: Yes How Accessible: Accessible via walker Does the patient have any problems obtaining your medications?: No  Social/Family/Support Systems Patient Roles: Parent Anticipated Caregiver: sister and other family members Anticipated Caregiver's Contact Information: see above Ability/Limitations of Caregiver: sister works 1 am until 82 pm. Brother in Sports coach in the home with the pt otherwise Caregiver Availability: 24/7 Discharge Plan Discussed with Primary Caregiver: Yes Is Caregiver In Agreement with Plan?: Yes Does Caregiver/Family have Issues with Lodging/Transportation while Pt is in Rehab?: No  Daughter states she is worried about sister, Katrina Donaldson, in pt's home. Not reliable. Pt has 11 living siblings. Daughter is in [place] but local during pt's admission. She requests that we keep her updated on pt needs so she can assist with planning if pt will need supervision in the home.   Goals/Additional Needs Patient/Family Goal for Rehab: Mod I to supervision with PT and OT Expected length of stay: [facility]OS 12-16 days Pt/Family Agrees to Admission and willing to participate: Yes Program Orientation Provided & Reviewed with Pt/Caregiver Including Roles  & Responsibilities: Yes  Decrease burden of Care through IP rehab admission: n/a  Possible need for SNF placement upon discharge: I discussed with  pt that [facility] will not likely pay for SNF after a CIR admit.She feels family can assist as needed in the home.  Patient Condition: This patient's condition remains as documented in the consult dated 07/23/2016, in which the Rehabilitation Physician determined and documented that the patient's condition is appropriate for intensive rehabilitative care in an inpatient rehabilitation facility. Will admit to inpatient rehab today.  Preadmission Screen Completed By:  Cleatrice Burke, 07/24/2016 4:42 PM ______________________________________________________________________   Discussed status with Dr. Posey Pronto on 07/24/2016 at  1645 and received telephone approval for admission today.  Admission Coordinator:  Cleatrice Burke, time [id number] Date 07/24/2016.

## 2016-07-24 NOTE — NC FL2 (Signed)
[place] LEVEL OF CARE SCREENING TOOL     IDENTIFICATION  Patient Name: Katrina Donaldson Birthdate: [birth year] Sex: female Admission Date (Current Location): 07/19/2016  [facility] and [place] Number:  Herbalist and Address:  [facility] [place], [place] [address] [place], [place] [address]      Provider Number: [id number]  Attending Physician Name and Address:  Consuella Lose, MD  Relative Name and Phone Number:       Current Level of Care: Hospital Recommended Level of Care: [place] Prior Approval Number:    Date Approved/Denied:   PASRR Number: [id number]  Discharge Plan: SNF    Current Diagnoses: Patient Active Problem List   Diagnosis Date Noted  . 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 Protein-calorie malnutrition ([place]) 12/21/2014  . Thyroid activity decreased 12/21/2014  . 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 Essential hypertension 12/13/2014  . Anemia, iron deficiency 12/13/2014  . Hypothyroidism 12/13/2014  . Prediabetes 12/13/2014    Orientation RESPIRATION BLADDER Height & Weight     Self, Time, Situation, Place  Normal Continent Weight: 200 lb 2.8 oz (90.8 kg) Height:  5\' 5"  (165.1 cm)  BEHAVIORAL SYMPTOMS/MOOD NEUROLOGICAL BOWEL NUTRITION STATUS      Continent Diet (Regular Diet, Thin Liquids)  AMBULATORY STATUS COMMUNICATION OF NEEDS Skin   Extensive Assist Verbally Normal                       Personal Care Assistance Level of Assistance  Bathing, Dressing, Feeding Bathing Assistance: Limited assistance Feeding assistance: Independent Dressing Assistance: Limited assistance     Functional Limitations Info  Sight, Hearing, Speech Sight Info: Adequate Hearing Info: Adequate Speech Info: Adequate    SPECIAL CARE FACTORS FREQUENCY  PT (By licensed PT), OT (By licensed OT)     PT Frequency: 5 OT Frequency: 5             Contractures Contractures Info: Not present    Additional Factors Info  Code Status, Allergies, Psychotropic Code Status Info: Full Code Allergies Info: Shrimp (Shellfish Allergy) Psychotropic Info: Meds:  Paxil         Current Medications (07/24/2016):  This is the current hospital active medication list Current Facility-Administered Medications  Medication Dose Route Frequency Provider Last Rate Last Dose  . 0.9 %  sodium chloride infusion   Intravenous Continuous Consuella Lose, MD   Stopped at 07/21/16 2003  . 0.9 %  sodium chloride infusion  250 mL Intravenous Continuous Consuella Lose, MD      . acetaminophen (TYLENOL) tablet 650 mg  650 mg Oral Q4H PRN Consuella Lose, MD   650 mg at 07/20/16 1837   Or  . acetaminophen (TYLENOL) suppository 650 mg  650 mg Rectal Q4H PRN Consuella Lose, MD   650 mg at 07/21/16 1214  . amLODipine (NORVASC) tablet 10 mg  10 mg Oral Daily Consuella Lose, MD   10 mg at 07/23/16 1128  . bisacodyl (DULCOLAX) suppository 10 mg  10 mg Rectal Daily PRN Consuella Lose, MD      . docusate sodium (COLACE) capsule 100 mg  100 mg Oral BID Consuella Lose, MD   100 mg at 07/23/16 2136  . feeding supplement (ENSURE ENLIVE) (ENSURE ENLIVE) liquid 237 mL  237 mL Oral TID BM Consuella Lose, MD   237 mL at 07/23/16 2137  . fluticasone (FLONASE) 50 MCG/ACT nasal spray 1 spray  1 spray Each Nare  Daily Consuella Lose, MD   1 spray at 07/23/16 1125  . HYDROmorphone (DILAUDID) injection 0.5-1 mg  0.5-1 mg Intravenous Q2H PRN Consuella Lose, MD   1 mg at 07/21/16 2138  . menthol-cetylpyridinium (CEPACOL) lozenge 3 mg  1 lozenge Oral PRN Consuella Lose, MD       Or  . phenol (CHLORASEPTIC) mouth spray 1 spray  1 spray Mouth/Throat PRN Consuella Lose, MD      . methocarbamol (ROBAXIN) tablet 500 mg  500 mg Oral Q6H PRN Consuella Lose, MD   500 mg at 07/23/16 2136   Or  . methocarbamol (ROBAXIN) 500 mg in dextrose 5 % 50 mL IVPB  500  mg Intravenous Q6H PRN Consuella Lose, MD      . metoprolol (LOPRESSOR) tablet 100 mg  100 mg Oral BID Consuella Lose, MD   100 mg at 07/23/16 1127  . multivitamin with minerals tablet 1 tablet  1 tablet Oral Daily Consuella Lose, MD   1 tablet at 07/23/16 1127  . naphazoline-glycerin (CLEAR EYES) ophth solution 1-2 drop  1-2 drop Both Eyes QID PRN Consuella Lose, MD      . ondansetron (ZOFRAN) injection 4 mg  4 mg Intravenous Q4H PRN Consuella Lose, MD   4 mg at 07/21/16 1157  . oxyCODONE-acetaminophen (PERCOCET/ROXICET) 5-325 MG per tablet 1-2 tablet  1-2 tablet Oral Q4H PRN Consuella Lose, MD   2 tablet at 07/23/16 2136  . pantoprazole (PROTONIX) EC tablet 40 mg  40 mg Oral BID Rebecka Apley, RPH   40 mg at 07/23/16 2136  . PARoxetine (PAXIL) tablet 20 mg  20 mg Oral Daily Consuella Lose, MD   20 mg at 07/23/16 1126  . senna (SENOKOT) tablet 8.6 mg  1 tablet Oral BID Consuella Lose, MD   8.6 mg at 07/23/16 2136  . simethicone (MYLICON) chewable tablet 80-160 mg  80-160 mg Oral Q6H PRN Consuella Lose, MD   160 mg at 07/20/16 2013  . sodium chloride flush (NS) 0.9 % injection 3 mL  3 mL Intravenous Q12H Consuella Lose, MD   3 mL at 07/23/16 1130  . sodium chloride flush (NS) 0.9 % injection 3 mL  3 mL Intravenous PRN Consuella Lose, MD      . thiamine (VITAMIN B-1) tablet 100 mg  100 mg Oral Daily Consuella Lose, MD   100 mg at 07/23/16 1127     Discharge Medications: Please see discharge summary for a list of discharge medications.  Relevant Imaging Results:  Relevant Lab Results:   Additional Information SSN:  [id number]  Darden Dates, LCSW

## 2016-07-25 ENCOUNTER — Inpatient Hospital Stay (HOSPITAL_COMMUNITY): Payer: BC Managed Care – PPO | Admitting: Occupational Therapy

## 2016-07-25 ENCOUNTER — Inpatient Hospital Stay (HOSPITAL_COMMUNITY): Payer: BC Managed Care – PPO

## 2016-07-25 ENCOUNTER — Inpatient Hospital Stay (HOSPITAL_COMMUNITY): Payer: BC Managed Care – PPO | Admitting: Physical Therapy

## 2016-07-25 DIAGNOSIS — I10 Essential (primary) hypertension: Secondary | ICD-10-CM

## 2016-07-25 DIAGNOSIS — E876 Hypokalemia: Secondary | ICD-10-CM

## 2016-07-25 DIAGNOSIS — G8918 Other acute postprocedural pain: Secondary | ICD-10-CM

## 2016-07-25 DIAGNOSIS — I82412 Acute embolism and thrombosis of left femoral vein: Secondary | ICD-10-CM

## 2016-07-25 DIAGNOSIS — M7989 Other specified soft tissue disorders: Secondary | ICD-10-CM

## 2016-07-25 DIAGNOSIS — G959 Disease of spinal cord, unspecified: Secondary | ICD-10-CM

## 2016-07-25 DIAGNOSIS — D696 Thrombocytopenia, unspecified: Secondary | ICD-10-CM

## 2016-07-25 DIAGNOSIS — D72829 Elevated white blood cell count, unspecified: Secondary | ICD-10-CM

## 2016-07-25 LAB — COMPREHENSIVE METABOLIC PANEL
ALBUMIN: 2.4 g/dL — AB (ref 3.5–5.0)
ALT: 16 U/L (ref 14–54)
AST: 22 U/L (ref 15–41)
Alkaline Phosphatase: 87 U/L (ref 38–126)
Anion gap: 9 (ref 5–15)
BUN: 16 mg/dL (ref 6–20)
CHLORIDE: 106 mmol/L (ref 101–111)
CO2: 28 mmol/L (ref 22–32)
CREATININE: 0.71 mg/dL (ref 0.44–1.00)
Calcium: 8.7 mg/dL — ABNORMAL LOW (ref 8.9–10.3)
GFR calc Af Amer: >60 mL/min (ref >60)
GFR calc non Af Amer: >60 mL/min (ref >60)
GLUCOSE: 145 mg/dL — AB (ref 65–99)
POTASSIUM: 3.3 mmol/L — AB (ref 3.5–5.1)
Sodium: 143 mmol/L (ref 135–145)
Total Bilirubin: 0.7 mg/dL (ref 0.3–1.2)
Total Protein: 6.7 g/dL (ref 6.5–8.1)

## 2016-07-25 LAB — CBC WITH DIFFERENTIAL/PLATELET
BASOS ABS: 0 10*3/uL (ref 0.0–0.1)
BASOS PCT: 0 %
Eosinophils Absolute: 0.1 10*3/uL (ref 0.0–0.7)
Eosinophils Relative: 1 %
HCT: 32.4 % — ABNORMAL LOW (ref 36.0–46.0)
Hemoglobin: 10.5 g/dL — ABNORMAL LOW (ref 12.0–15.0)
Lymphocytes Relative: 9 %
Lymphs Abs: 1.1 10*3/uL (ref 0.7–4.0)
MCH: 27.2 pg (ref 26.0–34.0)
MCHC: 32.4 g/dL (ref 30.0–36.0)
MCV: 83.9 fL (ref 78.0–100.0)
MONO ABS: 1.4 10*3/uL — AB (ref 0.1–1.0)
Monocytes Relative: 12 %
NEUTROS ABS: 9.2 10*3/uL — AB (ref 1.7–7.7)
Neutrophils Relative %: 78 %
PLATELETS: 123 10*3/uL — AB (ref 150–400)
RBC: 3.86 MIL/uL — AB (ref 3.87–5.11)
RDW: 15.5 % (ref 11.5–15.5)
WBC: 11.8 10*3/uL — AB (ref 4.0–10.5)

## 2016-07-25 MED ORDER — POTASSIUM CHLORIDE 20 MEQ PO PACK
20.0000 meq | PACK | Freq: Two times a day (BID) | ORAL | Status: AC
  Administered 2016-07-25 – 2016-07-27 (×4): 20 meq via ORAL
  Filled 2016-07-25 (×5): qty 1

## 2016-07-25 NOTE — Progress Notes (Signed)
**  Preliminary report by tech**  Bilateral lower extremity venous duplex complete. There is evidence of acute deep vein thrombosis involving the common femoral, femoral, profunda femoral, popliteal, posterior tibial, and peroneal veins of the right lower extremity. There is also evidence of acute superficial vein thrombosis involving the lesser saphenous vein of the right lower extremity. There is no evidence of deep or superficial vein thrombosis involving the left lower extremity. There is no evidence of a Baker's cyst bilaterally. Unable to visualize the iliac veins due to shadowing and bowel gas. Patient will need to be NPO at least 8 hours prior to study if IVC and iliac veins are to be observed. Results given to the patient's nurse, Olin Hauser.  07/25/16 4:56 PM Carlos Levering RVT

## 2016-07-25 NOTE — IPOC Note (Signed)
Overall Plan of Care [facility]) Patient Details Name: Katrina Donaldson MRN: [id number] DOB: 1955/11/09  Admitting Diagnosis: Spinal Cyst  Hospital Problems: Active Problems:   Myelopathy ([place])   Post-operative pain   Hypokalemia   Leukocytosis   Thrombocytopenia (HCC)   Acute deep vein thrombosis (DVT) of femoral vein of left lower extremity ([place])     Functional Problem List: Nursing Bowel, Medication Management, Endurance, Motor, Nutrition, Pain, Safety, Skin Integrity, Sensory  PT Balance, Endurance, Motor, Safety, Pain  OT Balance, Cognition, Pain, Motor, Endurance  SLP    TR         Basic ADL's: OT Bathing, Dressing, Toileting     Advanced  ADL's: OT       Transfers: PT Bed Mobility, Bed to Chair, Car, Manufacturing systems engineer, Metallurgist: PT Ambulation, Emergency planning/management officer, Stairs     Additional Impairments: OT None  SLP        TR      Anticipated Outcomes Item Anticipated Outcome  Self Feeding I  Swallowing      Basic self-care  min A LB, set up UB  Toileting  min A   Bathroom Transfers min A  Bowel/Bladder  Pt will manage bowel and bladder with min assist at discharge   Transfers  min A  Locomotion  supervision w/c level  Communication     Cognition     Pain  Pt will rate pain at 5 or less on a scale of 0-10 with min assist   Safety/Judgment  Pt will remain free of falls and injury with min assist    Therapy Plan: PT Intensity: Minimum of 1-2 x/day ,45 to 90 minutes PT Frequency: 5 out of 7 days PT Duration Estimated Length of Stay: 21 days OT Intensity: Minimum of 1-2 x/day, 45 to 90 minutes OT Frequency: 5 out of 7 days OT Duration/Estimated Length of Stay: 21-22 days         Team Interventions: Nursing Interventions Patient/Family Education, Bowel Management, Disease Management/Prevention, Pain Management, Medication Management, Skin Care/Wound Management, Cognitive Remediation/Compensation, Discharge Planning  PT  interventions Ambulation/gait training, Cognitive remediation/compensation, Discharge planning, DME/adaptive equipment instruction, Functional mobility training, Pain management, Splinting/orthotics, Therapeutic Activities, UE/LE Strength taining/ROM, Wheelchair propulsion/positioning, UE/LE Coordination activities, Therapeutic Exercise, Stair training, Patient/family education, Neuromuscular re-education, Functional electrical stimulation, Disease management/prevention, Academic librarian, Training and development officer  OT Interventions Training and development officer, Cognitive remediation/compensation, Discharge planning, DME/adaptive equipment instruction, Functional mobility training, Neuromuscular re-education, Pain management, Patient/family education, Self Care/advanced ADL retraining, Therapeutic Activities, Therapeutic Exercise, UE/LE Strength taining/ROM  SLP Interventions    TR Interventions    SW/CM Interventions Discharge Planning, Psychosocial Support, Patient/Family Education    Team Discharge Planning: Destination: PT-Home ,OT- Home , SLP-  Projected Follow-up: PT-Home health PT, OT-  Home health OT, SLP-  Projected Equipment Needs: PT-To be determined, OT- 3 in 1 bedside comode, Tub/shower bench, SLP-  Equipment Details: PT- , OT-  Patient/family involved in discharge planning: PT- Patient, Family member/caregiver,  OT-Patient unable/family or caregiver not available, Patient (pt very lethargic and able to comprehend very basics of goals), SLP-   MD ELOS: 19-22 days. Medical Rehab Prognosis:  Good Assessment: 61 y.o.right handed femalewith history of hypertension, hypercoagulable state, [facility] 11/29/2014 requiring [place]. Patient independent with a caneprior to admission.  Family in and out and cannot provide 24-hour care. Presented 07/19/2016 with gait disorder with decreased balance and some intermittent falls. MRI cervical thoracic and lumbar spine reviewed,  showing large loculated arachnoid  cyst with spinal cord compressionextending from the top of the thoracic spine to the upper lumbar spine.Underwent T11-L1 laminectomy, T4-T7 laminectomy, fenestration of arachnoid cyst 07/19/2016 per Dr. Kathyrn Sheriff. Hospital course pain management. No back brace required. Now found with extensive DVT.  Pt with resulting functional deficits with weakness, mobility, self care.  Will set goals for Min A with PT/OT.  See Team Conference Notes for weekly updates to the plan of care

## 2016-07-25 NOTE — Evaluation (Signed)
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 Myelopathy ([employer]) 07/24/2016  . Thoracic myelopathy   . Depression   . Benign essential HTN   . History of subarachnoid hemorrhage   . Hypercoagulable state ([place])   . Constipation due to pain medication   . 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 Protein-calorie malnutrition ([place]) 12/21/2014  . Thyroid activity decreased 12/21/2014  . 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 Essential hypertension 12/13/2014  . Anemia, iron deficiency 12/13/2014  . Hypothyroidism 12/13/2014  . Prediabetes 12/13/2014    Past Medical History:  Past Medical History:  Diagnosis Date  . Brain aneurysm   . Depression   . History of DVT (deep vein thrombosis)   . Hypercoagulable state ([place])   . Hypertension   . Hypothyroidism   . Stroke [facility]) 2016   [facility] 11/29/14   Past Surgical History:  Past Surgical History:  Procedure Laterality Date  . ABDOMINAL HYSTERECTOMY     patient denies  . BRAIN SURGERY     aneurysm  . goiter    . LAMINECTOMY N/A 07/19/2016   Procedure: LAMINECTOMY THORACIC FOUR THORACIC FIVE, THORACIC SIX TO THORACIC EIGHT,THORACIC TEN TO LUMBAR ONE, FENESTRATION OF ARACHNOID CYSTS;  Surgeon: Consuella Lose, MD;  Location: [place];  Service: Neurosurgery;  Laterality: N/A;  . THYROIDECTOMY      Assessment & Plan Clinical Impression: Katrina Donaldson is a 61 y.o. right handed female with history of hypertension, hypercoagulable state, [facility] 11/29/2014 requiring  surgery in [facility]. Per chart review patient independent with a cane prior to admission. Multilevel with 5 steps to entry. She has a sister and brother-in-law all and daughter recently moved into the downstairs of the home. Family in and out and cannot provide 24-hour care. Presented 07/19/2016 with gait disorder with decreased balance and some intermittent falls. MRI cervical thoracic and lumbar spine reviewed, showing large loculated arachnoid cyst with spinal cord compression extending from the top of the thoracic spine to the upper lumbar spine. Underwent T11-L1 laminectomy, T4-T7 laminectomy, fenestration of arachnoid cyst 07/19/2016 per Dr. Kathyrn Sheriff. Hospital course pain management. No back brace required. Physical occupational therapy evaluations completed with recommendations of physical medicine rehabilitation consult. Patient was admitted for a comprehensive rehabilitation program Patient transferred to CIR on 07/24/2016 .    Patient currently requires total with lower body basic self-care skills secondary to muscle weakness, decreased cardiorespiratoy endurance, motor apraxia, decreased memory and delayed processing and decreased sitting balance, decreased standing balance and decreased postural control.  Prior to hospitalization, patient was fully independent, ambulated with cane short distances, drove.  Patient will benefit from skilled intervention to increase independence with basic self-care skills prior to discharge home with care partner.  Anticipate patient will require minimal physical assistance and follow up home health.  OT - End of Session Activity Tolerance: Tolerates < 10 min activity, no significant change in vital signs Endurance Deficit: Yes Endurance Deficit Description: pt frequently falling asleep OT Assessment Rehab Potential (ACUTE ONLY): Good OT Patient demonstrates impairments in the following area(s): Balance;Cognition;Pain;Motor;Endurance OT Basic ADL's  Functional Problem(s): Bathing;Dressing;Toileting OT Transfers Functional Problem(s): Toilet;Tub/Shower OT Additional Impairment(s): None OT Plan OT Intensity: Minimum of 1-2 x/day, 45 to 90 minutes OT Frequency: 5 out of 7 days OT Duration/Estimated Length of Stay: 14-16 days OT Treatment/Interventions: Balance/vestibular training;Cognitive remediation/compensation;Discharge planning;DME/adaptive equipment instruction;Functional mobility training;Neuromuscular re-education;Pain management;Patient/family education;Self Care/advanced ADL retraining;Therapeutic Activities;Therapeutic Exercise;UE/LE Strength taining/ROM OT Self Feeding Anticipated Outcome(s): I OT Basic Self-Care Anticipated Outcome(s): min A LB, set up UB OT Toileting Anticipated Outcome(s): min A OT Bathroom Transfers Anticipated Outcome(s): min A OT Recommendation Patient destination: Home Follow Up Recommendations: Home health OT Equipment Recommended: 3 in 1 bedside comode;Tub/shower bench   Skilled Therapeutic Intervention Pt seen for initial evaluation and ADL training. Initially pt was alert, but 10 min into session she was continually nodding off. She was presenting with cognitive deficits of mild confusion (calling for her daughter who was not present), motor planning delays, memory.  Questionable if this was related to fatigue, medications, post anesthesia.  May benefit from SLP eval if cognition does not improve in a few days.  Pt stated she could stand to a walker. With several attempts from EOB, pt was unable to wt shift or elevate hips off bed even with total A.  Sit to stands from bed not safe at this time and pt unable to elevate hips enough to scoot safely to w/c. Used stedy lift to transfer bed to w/c and for sit to stand 2x for LB bathing and dressing with 80-90% assist to stand. By 2nd time pt so fatigued that RN needed to assist with clothing management while therapist assisted pt to stand in stedy. Pt needed  max cues at times to fully bathe UB or with donning shirt. Again this could be attributed to pain meds and/or fatigue.   Reviewed OT POC and goals and pt seemed to be following 25% of the conversation. Pt adjusted in wc with safety belt and all needs met.  OT Evaluation Precautions/Restrictions  Precautions Precautions: Fall;Back Precaution Comments: recalled 2/3 back precautions    Vital Signs Therapy Vitals Pulse Rate: 86 BP: 129/70 Pain Pain Assessment Pain Assessment:  (no c/o pain at rest, stated she has some burning in back with movement) Home Living/Prior [place] expects to be discharged to:: Private residence Living Arrangements: Other relatives Available Help at Discharge: Family, Available 24 hours/day Type of Home: House Home Access: Stairs to enter [employer] of Steps: 5 Entrance Stairs-Rails: Right, Left Home Layout: Two level, Full bath on main level, Other (Comment) Alternate Level Stairs-Number of Steps: has bedrrom and full bath on main level Bathroom Shower/Tub: Tub/shower unit, Architectural technologist: Handicapped height  Lives With: Family Prior Function Level of Independence: Independent with basic ADLs, Requires assistive device for independence, Independent with gait, Independent with transfers, Independent with homemaking with ambulation  Able to Take Stairs?: Yes Driving: Yes Vocation: Retired Comments: drove and cooked and cleaned,  some falls a few months ago ADL ADL ADL Comments: refer to functional navigator Vision/Perception  Vision- History Baseline Vision/History: Wears glasses Wears Glasses: Distance only Patient Visual Report: No change from baseline Vision- Assessment Vision Assessment?: No apparent visual deficits  Cognition Overall Cognitive Status: Impaired/Different from baseline Arousal/Alertness: Suspect due to medications Orientation Level: Place;Situation;Person Person:  Oriented Place: Oriented Situation: Oriented Year: 2018 Month: January Day of Week: Incorrect Memory: Impaired Memory Impairment: Retrieval deficit Immediate Memory Recall: Sock;Blue;Bed Memory Recall: Sock;Blue;Bed Memory Recall Sock: Without Cue Memory Recall Blue: Without Cue Memory Recall Bed: Without Cue  Awareness: Impaired Awareness Impairment: Intellectual impairment (decreased awareness of current deficits (ie lower body strength)) Problem Solving: Impaired Problem Solving Impairment: Functional basic Comments: very slow processing  Sensation Sensation Light Touch: Appears Intact Stereognosis: Not tested Hot/Cold: Not tested Proprioception: Impaired by gross assessment Coordination Gross Motor Movements are Fluid and Coordinated: No Fine Motor Movements are Fluid and Coordinated: Yes Coordination and Movement Description: delayed movement in LE Finger Nose Finger Test: not tested Motor  Motor Motor - Skilled Clinical Observations: AROM in LEs, but severe weakness Mobility    refer to functional navigator Trunk/Postural Assessment  Cervical Assessment Cervical Assessment: Within Functional Limits Lumbar Assessment Lumbar Assessment:  (back precautions) Postural Control Postural Control:  (forward tilt at hips in standing)  Balance Static Sitting Balance Static Sitting - Level of Assistance: 4: Min assist (min guard) Dynamic Sitting Balance Dynamic Sitting - Level of Assistance: 3: Mod assist Sitting balance - Comments: back precautions Static Standing Balance Static Standing - Level of Assistance: 1: +2 Total assist;Patient percentage (comment) (less than 10%) Extremity/Trunk Assessment RUE Assessment RUE Assessment: Within Functional Limits LUE Assessment LUE Assessment: Within Functional Limits   See Function Navigator for Current Functional Status.   Refer to Care Plan for Long Term Goals  Recommendations for other services: None    Discharge  Criteria: Patient will be discharged from OT if patient refuses treatment 3 consecutive times without medical reason, if treatment goals not met, if there is a change in medical status, if patient makes no progress towards goals or if patient is discharged from hospital.  The above assessment, treatment plan, treatment alternatives and goals were discussed and mutually agreed upon: by patient (needs reinforcement, not fully comprehending)  [place] 07/25/2016, 12:56 PM

## 2016-07-25 NOTE — Progress Notes (Signed)
Patient information reviewed and entered into eRehab system by Zackariah Vanderpol, RN, CRRN, PPS Coordinator.  Information including medical coding and functional independence measure will be reviewed and updated through discharge.    

## 2016-07-25 NOTE — Evaluation (Signed)
Physical Therapy Assessment and Plan  Patient Details  Name: Katrina Donaldson MRN: [id number] Date of Birth: Feb 27, 1956  PT Diagnosis: Abnormal posture, Abnormality of gait, Cognitive deficits, Difficulty walking, Impaired cognition and Muscle weakness Rehab Potential: Good ELOS: 21 days   Today's Date: 07/25/2016 PT Individual Time: 1400-1500 PT Individual Time Calculation (min): 60 min    Problem List:  Patient Active Problem List   Diagnosis Date Noted  . Myelopathy ([place]) 07/24/2016  . Thoracic myelopathy   . Depression   . Benign essential HTN   . History of subarachnoid hemorrhage   . Hypercoagulable state ([place])   . Constipation due to pain medication   . 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 Protein-calorie malnutrition ([place]) 12/21/2014  . Thyroid activity decreased 12/21/2014  . 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 Essential hypertension 12/13/2014  . Anemia, iron deficiency 12/13/2014  . Hypothyroidism 12/13/2014  . Prediabetes 12/13/2014    Past Medical History:  Past Medical History:  Diagnosis Date  . Brain aneurysm   . Depression   . History of DVT (deep vein thrombosis)   . Hypercoagulable state ([place])   . Hypertension   . Hypothyroidism   . Stroke [facility]) 2016   [facility] 11/29/14   Past Surgical History:  Past Surgical History:  Procedure Laterality Date  . ABDOMINAL HYSTERECTOMY     patient denies  . BRAIN SURGERY     aneurysm  . goiter    . LAMINECTOMY N/A 07/19/2016   Procedure: LAMINECTOMY THORACIC FOUR THORACIC FIVE, THORACIC SIX TO THORACIC EIGHT,THORACIC TEN TO LUMBAR ONE, FENESTRATION OF ARACHNOID CYSTS;  Surgeon: Consuella Lose, MD;  Location: [place];  Service: Neurosurgery;  Laterality: N/A;  . THYROIDECTOMY      Assessment & Plan Clinical Impression: Patient is a 61 y.o. year old female with recent admission to the hospital on 07/19/2016 with gait disorder with decreased balance and some  intermittent falls. MRI cervical thoracic and lumbar spine reviewed, showing large loculated arachnoid cyst with spinal cord compressionextending from the top of the thoracic spine to the upper lumbar spine.Underwent T11-L1 laminectomy, T4-T7 laminectomy, fenestration of arachnoid cyst 07/19/2016.  Patient transferred to CIR on 07/24/2016 .   Patient currently requires total with mobility secondary to muscle weakness, decreased initiation, decreased problem solving and delayed processing and decreased sitting balance, decreased standing balance, decreased postural control and decreased balance strategies.  Prior to hospitalization, patient was modified independent  with mobility and lived with Family in a House home.  Home access is 10Stairs to enter.  Patient will benefit from skilled PT intervention to maximize safe functional mobility, minimize fall risk and decrease caregiver burden for planned discharge home with 24 hour assist.  Anticipate patient will benefit from follow up [facility] at discharge.  PT - End of Session Activity Tolerance: Tolerates 30+ min activity with multiple rests Endurance Deficit: Yes Endurance Deficit Description: pt frequently falling asleep PT Assessment Rehab Potential (ACUTE/IP ONLY): Good Barriers to Discharge: Inaccessible home environment;Decreased caregiver support PT Patient demonstrates impairments in the following area(s): Balance;Endurance;Motor;Safety;Pain PT Transfers Functional Problem(s): Bed Mobility;Bed to Chair;Car;Furniture PT Locomotion Functional Problem(s): Ambulation;Wheelchair Mobility;Stairs PT Plan PT Intensity: Minimum of 1-2 x/day ,45 to 90 minutes PT Frequency: 5 out of 7 days PT Duration Estimated Length of Stay: 21 days PT Treatment/Interventions: Ambulation/gait training;Cognitive remediation/compensation;Discharge planning;DME/adaptive equipment instruction;Functional mobility training;Pain management;Splinting/orthotics;Therapeutic  Activities;UE/LE Strength taining/ROM;Wheelchair propulsion/positioning;UE/LE Coordination activities;Therapeutic Exercise;Stair training;Patient/family education;Neuromuscular re-education;Functional electrical stimulation;Disease management/prevention;Community reintegration;Balance/vestibular training PT Transfers Anticipated Outcome(s): min A PT  Locomotion Anticipated Outcome(s): supervision w/c level PT Recommendation Follow Up Recommendations: Home health PT Patient destination: Home Equipment Recommended: To be determined  Skilled Therapeutic Intervention Pt rec'd in w/c agreeable to therapy.  Pt performed scooting transfer with max A to mat.  Sitting balance with min A, total A to correct LOB posteriorly. Sit to stand multiple reps for elevated mat with total/max A to stand. Min A for standing balance with bilat UE support. Pt able to stand x 30 seconds before fatigue.  Pt then with need to use bathroom. Used [name] for toilet transfers with max cuing for pt to assist with pulling to stand.  Pt requires increased time for processing all commands.  Total A for bed mobility due to fatigue.  Discussed PT POC with pt and daughter and sister.  PT Evaluation Precautions/Restrictions Precautions Precautions: Fall;Back Precaution Comments: recalled 2/3 back precautions Restrictions Weight Bearing Restrictions: No Pain Pain Assessment Pain Assessment: No/denies pain Home Living/Prior Functioning Home Living Available Help at Discharge: Family;Available 24 hours/day Type of Home: House Home Access: Stairs to enter [employer] of Steps: 10 Entrance Stairs-Rails: Right;Left Home Layout: Two level;Bed/bath upstairs Alternate Level Stairs-Number of Steps: has bedrrom and full bath on main level Bathroom Shower/Tub: Tub/shower unit;Curtain Bathroom Toilet: Handicapped height  Lives With: Family Prior Function Level of Independence: Independent with basic ADLs;Requires assistive  device for independence;Independent with transfers;Independent with homemaking with ambulation  Able to Take Stairs?: Yes Driving: Yes Vocation: Retired Comments: drove and cooked and cleaned,  some falls a few months ago  Cognition Overall Cognitive Status: Impaired/Different from baseline Arousal/Alertness: Suspect due to medications Problem Solving: Impaired Problem Solving Impairment: Functional basic Comments: very slow processing  Sensation Sensation Light Touch: Appears Intact Stereognosis: Not tested Hot/Cold: Not tested Proprioception: Impaired by gross assessment Coordination Gross Motor Movements are Fluid and Coordinated: No Fine Motor Movements are Fluid and Coordinated: Yes Coordination and Movement Description: delayed movement in LE Finger Nose Finger Test: not tested Motor  Motor Motor - Skilled Clinical Observations: generalized weakness   Trunk/Postural Assessment  Cervical Assessment Cervical Assessment: Within Functional Limits Thoracic Assessment Thoracic Assessment:  (back precautions) Lumbar Assessment Lumbar Assessment:  (back precautions) Postural Control Postural Control: Deficits on evaluation Righting Reactions: delayed Protective Responses: delayed  Balance Balance Balance Assessed: Yes Static Sitting Balance Static Sitting - Level of Assistance: 4: Min assist (min guard) Dynamic Sitting Balance Dynamic Sitting - Level of Assistance: 3: Mod assist Sitting balance - Comments: back precautions Static Standing Balance Static Standing - Level of Assistance: 1: +2 Total assist Extremity Assessment  RUE Assessment RUE Assessment: Within Functional Limits LUE Assessment LUE Assessment: Within Functional Limits RLE Assessment RLE Assessment:  (3-/5) LLE Assessment LLE Assessment:  (3-/5)   See Function Navigator for Current Functional Status.   Refer to Care Plan for Long Term Goals  Recommendations for other services: None    Discharge Criteria: Patient will be discharged from PT if patient refuses treatment 3 consecutive times without medical reason, if treatment goals not met, if there is a change in medical status, if patient makes no progress towards goals or if patient is discharged from hospital.  The above assessment, treatment plan, treatment alternatives and goals were discussed and mutually agreed upon: by patient and by family  Tomara Youngberg 07/25/2016, 3:05 PM

## 2016-07-25 NOTE — Progress Notes (Signed)
[place] PHYSICAL MEDICINE & REHABILITATION     PROGRESS NOTE  Subjective/Complaints:  Pt seen laying in bed this AM.  She slept well overnight.  She is worried because she knows she will have to work hard today.   ROS: Denies CP, SOB, N/V/D.  Objective: Vital Signs: Blood pressure 92/65, pulse 87, temperature 98.2 F (36.8 C), temperature source Oral, resp. rate 18, height 5\' 5"  (1.651 m), weight 86 kg (189 lb 9.5 oz), SpO2 96 %. No results found.  Recent Labs  07/25/16 0738  WBC 11.8*  HGB 10.5*  HCT 32.4*  PLT 123*    Recent Labs  07/25/16 0738  NA 143  K 3.3*  CL 106  GLUCOSE 145*  BUN 16  CREATININE 0.71  CALCIUM 8.7*   CBG (last 3)  No results for input(s): GLUCAP in the last 72 hours.  Wt Readings from Last 3 Encounters:  07/24/16 86 kg (189 lb 9.5 oz)  07/19/16 90.8 kg (200 lb 2.8 oz)  07/21/15 85.7 kg (189 lb)    Physical Exam:  BP 92/65 (BP Location: Left Arm)   Pulse 87   Temp 98.2 F (36.8 C) (Oral)   Resp 18   Ht 5\' 5"  (1.651 m)   Wt 86 kg (189 lb 9.5 oz)   SpO2 96%   BMI 31.55 kg/m  Constitutional: She appears well-developed. Obese  HENT: Normocephalic and atraumatic.  Eyes: EOM are normal. No discharge. Cardiovascular: Normal rate, regular rhythm. No JVD. Respiratory: Effort normal and breath sounds normal.  GI: Soft. Bowel sounds are normal.   Musculoskeletal: She exhibits no edema or tenderness.  Neurological: She is alert and oriented.  Follows full commands.  Motor: LUE: 4+/5 proximal to distal LLE: 4/5 HF,KE, 4+/5 ADF/PF RUE: 4-/5 proximal to distal RLE: HF, KE 4-/5, ADF/PF 4+/5  Skin: Skin is warm and dry.  Back dressing intact  Psychiatric: She has a normal mood and affect.   Assessment/Plan: 1. Functional deficits secondary to myelopathy which require 3+ hours per day of interdisciplinary therapy in a comprehensive inpatient rehab setting. Physiatrist is providing close team supervision and 24 hour management of active  medical problems listed below. Physiatrist and rehab team continue to assess barriers to discharge/monitor patient progress toward functional and medical goals.  Function:  Bathing Bathing position   Position: Wheelchair/chair at sink  Bathing parts Body parts bathed by patient: Right arm, Left arm, Chest, Abdomen, Front perineal area (max cues ) Body parts bathed by helper: Buttocks, Right upper leg, Left upper leg, Right lower leg, Left lower leg, Back  Bathing assist Assist Level: Supervision or verbal cues      Upper Body Dressing/Undressing Upper body dressing   What is the patient wearing?: Pull over shirt/dress     Pull over shirt/dress - Perfomed by patient: Thread/unthread right sleeve, Thread/unthread left sleeve, Pull shirt over trunk (max cues) Pull over shirt/dress - Perfomed by helper: Put head through opening        Upper body assist Assist Level: Supervision or verbal cues      Lower Body Dressing/Undressing Lower body dressing   What is the patient wearing?: Pants, Non-skid slipper socks       Pants- Performed by helper: Thread/unthread right pants leg, Thread/unthread left pants leg, Pull pants up/down   Non-skid slipper socks- Performed by helper: Don/doff right sock, Don/doff left sock                  Lower body assist  Toileting Toileting     Toileting steps completed by helper: Adjust clothing prior to toileting, Performs perineal hygiene, Adjust clothing after toileting    Toileting assist Assist level: Touching or steadying assistance (Pt.75%)   Transfers Chair/bed transfer   Chair/bed transfer method: Other Chair/bed transfer assist level: 2 helpers Chair/bed transfer assistive device: Mechanical lift, Other Mechanical lift: Stedy   Locomotion Ambulation Ambulation activity did not occur: Safety/medical concerns         Wheelchair   Type: Manual Max wheelchair distance: 10 Assist Level: Touching or steadying  assistance (Pt > 75%)  Cognition Comprehension Comprehension assist level: Understands basic 75 - 89% of the time/ requires cueing 10 - 24% of the time  Expression Expression assist level: Expresses basic 90% of the time/requires cueing < 10% of the time.  Social Interaction Social Interaction assist level: Interacts appropriately 75 - 89% of the time - Needs redirection for appropriate language or to initiate interaction.  Problem Solving Problem solving assist level: Solves basic 75 - 89% of the time/requires cueing 10 - 24% of the time  Memory Memory assist level: Recognizes or recalls 75 - 89% of the time/requires cueing 10 - 24% of the time    Medical Problem List and Plan: 1.  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 No brace required.   Neurosurgery when to resume aspirin 81 mg daily as prior to admission  Begin CIR. 2.  DVT Prophylaxis/Anticoagulation: SCDs.   Vascular study pending. 3. Pain Management: Robaxin and oxycodone as needed. Monitor with increased mobility 4. Mood: Paxil 20 mg daily, Provide emotional support 5. Neuropsych: This patient is capable of making decisions on her own behalf. 6. Skin/Wound Care: Routine skin checks 7. Fluids/Electrolytes/Nutrition: Routine I&Os 8. Hypertension. Norvasc 10 mg daily, Lopressor 100 mg twice a day.   Monitor with increased mobility 9. 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 10. History hypercoagulable state. INR 1.09. 11. Constipation. Laxative assistive 12. Hypokalemia  K+ 3.3 on 1/25  Supplemented x2 days 13. Leukocytosis  WBCs 11.8 on 1/25  Improving 14. ABLA  Hb 10.5 on 1/25   Cont to monitor  Labs ordered for tomorrow 15. Thrombocytopenia  Plts 123 on 1/25, trending down  Labs ordered for tomorrow  Cont to monitor     LOS (Days) 1 A FACE TO FACE EVALUATION WAS PERFORMED  Katrina Donaldson Lorie Phenix 07/25/2016 3:16 PM

## 2016-07-25 NOTE — Progress Notes (Signed)
Brief Note (please see previous note): DVT report reviewed, will make NPO for repeat study.  Will contact Neurosurg for anticoagulation and Heme/Onc for hypercoagulable state.  Will determine further plans at that point. Bed rest for now.

## 2016-07-25 NOTE — Progress Notes (Signed)
Occupational Therapy Session Note  Patient Details  Name: Katrina Donaldson MRN: [id number] Date of Birth: 1956/06/11  Today's Date: 07/25/2016 OT Individual Time: 1300-1400 OT Individual Time Calculation (min): 60 min    Short Term Goals: Week 1:  OT Short Term Goal 1 (Week 1): To increase I with LB dressing, pt will sit to stand with mod A. OT Short Term Goal 2 (Week 1): Pt will use reacher to don pants over feet.  OT Short Term Goal 3 (Week 1): Pt will complete squat pivot transfer to toilet with mod A. OT Short Term Goal 4 (Week 1): Pt will tolerate standing with mod A for at least 1 min during toileting tasks,  Skilled Therapeutic Interventions/Progress Updates:  Balance/vestibular training;Cognitive remediation/compensation;Discharge planning;DME/adaptive equipment instruction;Functional mobility training;Neuromuscular re-education;Pain management;Patient/family education;Self Care/advanced ADL retraining;Therapeutic Activities;Therapeutic Exercise;UE/LE Strength taining/ROM   1:1 Pt on toilet when arrived and had transferred there via the [place]. Pt required total A for hygiene and clothing management in standing position in the [place]. Focus on anterior weight shift / pelvic tilt. Pt fatigues quickly and requires frequent rest breaks. Pt washed hands at sink with assistance to lean forward and reach with left UE to be able to effectively wash hands with more than reasonable amt of time. Pt declined to brush teeth or eat lunch. Pt self propelled w/c with max A ~30 feet. Pass off to next therapist.   Therapy Documentation Precautions:  Precautions Precautions: Fall, Back Precaution Comments: recalled 2/3 back precautions Restrictions Weight Bearing Restrictions: No Pain: Pain Assessment Pain Assessment:  (no c/o pain at rest, stated she has some burning in back with movement)  See Function Navigator for Current Functional Status.   Therapy/Group: Individual Therapy  Willeen Cass [facility] 07/25/2016, 2:12 PM

## 2016-07-25 NOTE — Progress Notes (Signed)
Initial Nutrition Assessment  DOCUMENTATION CODES:   Obesity unspecified  INTERVENTION:  Continue Ensure Enlive po TID, each supplement provides 350 kcal and 20 grams of protien.  Encourage adequate PO intake.   NUTRITION DIAGNOSIS:   Inadequate oral intake related to  (dislike of food at meals) as evidenced by per patient/family report, meal completion < 50%.  GOAL:   Patient will meet greater than or equal to 90% of their needs  MONITOR:   PO intake, Supplement acceptance, Labs, Weight trends, Skin, I & O's  REASON FOR ASSESSMENT:   Malnutrition Screening Tool    ASSESSMENT:   61 y.o. right handed female with history of hypertension, hypercoagulable state, [facility] 11/29/2014 requiring surgery in [facility].  Presented 07/19/2016 with gait disorder with decreased balance and some intermittent falls. 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 Underwent T11-L1 laminectomy, T4-T7 laminectomy, fenestration of arachnoid cyst 07/19/2016   Meal completion 25% this AM. Pt reports appetite is fine however dislikes most of her food at meals. Food choices were discussed and encouraged pt to ask family to bring in foods she prefers to eat. Pt reports eating well PTA with usual consumption of at least 3 meals a day. Pt currently has Ensure ordered and has been consuming them. RD to continue with current orders to aid in adequate nutrition. Weight history reviewed. Question accuracy of recent recorded weight as pt weight recorded 200 lbs 5 days ago. RD to continue to monitor.   Nutrition-Focused physical exam completed. Findings are no fat depletion, mild to moderate muscle depletion, and no edema.   Labs and medications reviewed.   Diet Order:  Diet regular Room service appropriate? Yes; Fluid consistency: Thin  Skin:   (Incision on back)  Last  BM:  1/25  Height:   Ht Readings from Last 1 Encounters:  07/24/16 5\' 5"  (1.651 m)    Weight:   Wt Readings from Last 1 Encounters:  07/24/16 189 lb 9.5 oz (86 kg)    Ideal Body Weight:  56.8 kg  BMI:  Body mass index is 31.55 kg/m.  Estimated Nutritional Needs:   Kcal:  1700-1900  Protein:  80-95 grams  Fluid:  1.7 - 1.9 L/day  EDUCATION NEEDS:   No education needs identified at this time  Corrin Parker, MS, RD, LDN Pager # [phone] After hours/ weekend pager # [phone]

## 2016-07-26 ENCOUNTER — Inpatient Hospital Stay (HOSPITAL_COMMUNITY): Payer: BC Managed Care – PPO

## 2016-07-26 ENCOUNTER — Inpatient Hospital Stay (HOSPITAL_COMMUNITY): Payer: BC Managed Care – PPO | Admitting: Occupational Therapy

## 2016-07-26 DIAGNOSIS — M7989 Other specified soft tissue disorders: Secondary | ICD-10-CM

## 2016-07-26 DIAGNOSIS — D62 Acute posthemorrhagic anemia: Secondary | ICD-10-CM

## 2016-07-26 LAB — CBC WITH DIFFERENTIAL/PLATELET
BASOS ABS: 0 10*3/uL (ref 0.0–0.1)
BASOS PCT: 0 %
EOS ABS: 0.1 10*3/uL (ref 0.0–0.7)
Eosinophils Relative: 1 %
HCT: 29.7 % — ABNORMAL LOW (ref 36.0–46.0)
HEMOGLOBIN: 9.6 g/dL — AB (ref 12.0–15.0)
LYMPHS PCT: 15 %
Lymphs Abs: 2.1 10*3/uL (ref 0.7–4.0)
MCH: 27.1 pg (ref 26.0–34.0)
MCHC: 32.3 g/dL (ref 30.0–36.0)
MCV: 83.9 fL (ref 78.0–100.0)
MONO ABS: 1.5 10*3/uL — AB (ref 0.1–1.0)
Monocytes Relative: 11 %
NEUTROS ABS: 10.3 10*3/uL — AB (ref 1.7–7.7)
NEUTROS PCT: 73 %
PLATELETS: 142 10*3/uL — AB (ref 150–400)
RBC: 3.54 MIL/uL — ABNORMAL LOW (ref 3.87–5.11)
RDW: 15.3 % (ref 11.5–15.5)
WBC: 14 10*3/uL — ABNORMAL HIGH (ref 4.0–10.5)

## 2016-07-26 MED ORDER — RIVAROXABAN 20 MG PO TABS
20.0000 mg | ORAL_TABLET | Freq: Every day | ORAL | Status: DC
  Administered 2016-08-16 – 2016-08-23 (×7): 20 mg via ORAL
  Filled 2016-07-26 (×9): qty 1

## 2016-07-26 MED ORDER — RIVAROXABAN 15 MG PO TABS
15.0000 mg | ORAL_TABLET | Freq: Two times a day (BID) | ORAL | Status: AC
  Administered 2016-07-26 – 2016-08-15 (×41): 15 mg via ORAL
  Filled 2016-07-26 (×43): qty 1

## 2016-07-26 MED ORDER — RIVAROXABAN 15 MG PO TABS
15.0000 mg | ORAL_TABLET | Freq: Two times a day (BID) | ORAL | Status: DC
  Administered 2016-07-26: 15 mg via ORAL
  Filled 2016-07-26: qty 1

## 2016-07-26 NOTE — Progress Notes (Signed)
Patient with extensive right lower extremity DVT after back surgery with history of hypercoagulable state. Spoke to neurosurgery Dr. Kathyrn Sheriff who agrees with anticoagulation questions Xarelto but request hematology follow-up in regards to hypercoagulable state for appropriate anticoagulation versus IVC filter

## 2016-07-26 NOTE — Progress Notes (Signed)
Physical Therapy Note  Patient Details  Name: Katrina Donaldson MRN: [id number] Date of Birth: 06-Sep-1955 Today's Date: 07/26/2016    Pt missed 60 min of skilled PT due to orders for bedrest until scan can be completed. Pt with +DVT. Will follow up as able and resume therapies once ordered.   Canary Brim Ivory Broad, PT, DPT  07/26/2016, 9:15 AM

## 2016-07-26 NOTE — Discharge Instructions (Addendum)
Inpatient Rehab Discharge Instructions  Katrina Donaldson Discharge date and time: No discharge date for patient encounter.   Activities/Precautions/ Functional Status: Activity: activity as tolerated Diet: regular diet Wound Care: keep wound clean and dry Functional status:  ___ No restrictions     ___ Walk up steps independently ___ 24/7 supervision/assistance   ___ Walk up steps with assistance ___ Intermittent supervision/assistance  ___ Bathe/dress independently ___ Walk with walker     _x__ Bathe/dress with assistance ___ Walk Independently    ___ Shower independently ___ Walk with assistance    ___ Shower with assistance ___ No alcohol     ___ Return to work/school ________    COMMUNITY REFERRALS UPON DISCHARGE:    Home Health:   PT     OT    RN                     Agency:  [facility] Phone: [phone]   Medical Equipment/Items Ordered: wheelchair, cushion, walker, drop arm and transfer board                                                     Agency/Supplier:  [facility] @ [phone]      Special Instructions:  Hold aspirin until cleared by neurosurgery Dr. Kathyrn Sheriff  My questions have been answered and I understand these instructions. I will adhere to these goals and the provided educational materials after my discharge from the hospital.  Patient/Caregiver Signature _______________________________ Date __________  Clinician Signature _______________________________________ Date __________  Please bring this form and your medication list with you to all your follow-up doctor's appointments. Information on my medicine - XARELTO (rivaroxaban)  This medication education was reviewed with me or my healthcare representative as part of my discharge preparation.    WHY WAS XARELTO PRESCRIBED FOR YOU? Xarelto was prescribed to treat blood clots that may have been found in the veins of your legs (deep vein thrombosis) or in your lungs (pulmonary  embolism) and to reduce the risk of them occurring again.  What do you need to know about Xarelto? The starting dose is one 15 mg tablet taken TWICE daily with food for the FIRST 21 DAYS then on 08/16/2016 the dose is changed to one 20 mg tablet taken ONCE A DAY with your evening meal.  DO NOT stop taking Xarelto without talking to the health care provider who prescribed the medication.  Refill your prescription for 20 mg tablets before you run out.  After discharge, you should have regular check-up appointments with your healthcare provider that is prescribing your Xarelto.  In the future your dose may need to be changed if your kidney function changes by a significant amount.  What do you do if you miss a dose? If you are taking Xarelto TWICE DAILY and you miss a dose, take it as soon as you remember. You may take two 15 mg tablets (total 30 mg) at the same time then resume your regularly scheduled 15 mg twice daily the next day.  If you are taking Xarelto ONCE DAILY and you miss a dose, take it as soon as you remember on the same day then continue your regularly scheduled once daily regimen the next day. Do not take two doses of Xarelto at the same time.   Important  Safety Information Xarelto is a blood thinner medicine that can cause bleeding. You should call your healthcare provider right away if you experience any of the following: ? Bleeding from an injury or your nose that does not stop. ? Unusual colored urine (red or dark brown) or unusual colored stools (red or black). ? Unusual bruising for unknown reasons. ? A serious fall or if you hit your head (even if there is no bleeding).  Some medicines may interact with Xarelto and might increase your risk of bleeding while on Xarelto. To help avoid this, consult your healthcare provider or pharmacist prior to using any new prescription or non-prescription medications, including herbals, vitamins, non-steroidal anti-inflammatory  drugs (NSAIDs) and supplements.  This website has more information on Xarelto: [email or web address]

## 2016-07-26 NOTE — Progress Notes (Signed)
[place] PHYSICAL MEDICINE & REHABILITATION     PROGRESS NOTE  Subjective/Complaints:  Pt seen laying in bed this AM.  She slept well overnight, but she states she doesn't remember how therapies were yesterday.  She was found to have extensive DVT and history of hypercoaguability, but does not remember ever seeing Heme/Onc.  ROS: Denies CP, SOB, N/V/D.  Objective: Vital Signs: Blood pressure 132/66, pulse 100, temperature 98 F (36.7 C), temperature source Oral, resp. rate 18, height 5\' 5"  (1.651 m), weight 86 kg (189 lb 9.5 oz), SpO2 97 %. No results found.  Recent Labs  07/25/16 0738 07/26/16 0607  WBC 11.8* 14.0*  HGB 10.5* 9.6*  HCT 32.4* 29.7*  PLT 123* 142*    Recent Labs  07/25/16 0738  NA 143  K 3.3*  CL 106  GLUCOSE 145*  BUN 16  CREATININE 0.71  CALCIUM 8.7*   CBG (last 3)  No results for input(s): GLUCAP in the last 72 hours.  Wt Readings from Last 3 Encounters:  07/24/16 86 kg (189 lb 9.5 oz)  07/19/16 90.8 kg (200 lb 2.8 oz)  07/21/15 85.7 kg (189 lb)    Physical Exam:  BP 132/66 (BP Location: Left Arm)   Pulse 100   Temp 98 F (36.7 C) (Oral)   Resp 18   Ht 5\' 5"  (1.651 m)   Wt 86 kg (189 lb 9.5 oz)   SpO2 97%   BMI 31.55 kg/m  Constitutional: She appears well-developed. Obese  HENT: Normocephalic and atraumatic.  Eyes: EOM are normal. No discharge. Cardiovascular: RRR. No JVD. Respiratory: Effort normal and breath sounds normal.  GI: Soft. Bowel sounds are normal.   Musculoskeletal: She exhibits no edema or tenderness.  Neurological: She is alert and oriented.  Follows full commands.  Motor: LUE: 4+/5 proximal to distal LLE: 4/5 HF,KE, 4+/5 ADF/PF RUE: 4-/5 proximal to distal RLE: HF, KE 4-/5, ADF/PF 4+/5  Skin: Skin is warm and dry.  Back dressing intact  Psychiatric: She has a normal mood and affect.   Assessment/Plan: 1. Functional deficits secondary to myelopathy which require 3+ hours per day of interdisciplinary therapy in  a comprehensive inpatient rehab setting. Physiatrist is providing close team supervision and 24 hour management of active medical problems listed below. Physiatrist and rehab team continue to assess barriers to discharge/monitor patient progress toward functional and medical goals.  Function:  Bathing Bathing position   Position: Wheelchair/chair at sink  Bathing parts Body parts bathed by patient: Right arm, Left arm, Chest, Abdomen, Front perineal area (max cues ) Body parts bathed by helper: Buttocks, Right upper leg, Left upper leg, Right lower leg, Left lower leg, Back  Bathing assist Assist Level: Supervision or verbal cues      Upper Body Dressing/Undressing Upper body dressing   What is the patient wearing?: Pull over shirt/dress     Pull over shirt/dress - Perfomed by patient: Thread/unthread right sleeve, Thread/unthread left sleeve, Pull shirt over trunk (max cues) Pull over shirt/dress - Perfomed by helper: Put head through opening        Upper body assist Assist Level: Supervision or verbal cues      Lower Body Dressing/Undressing Lower body dressing   What is the patient wearing?: Pants, Non-skid slipper socks       Pants- Performed by helper: Thread/unthread right pants leg, Thread/unthread left pants leg, Pull pants up/down   Non-skid slipper socks- Performed by helper: Don/doff right sock, Don/doff left sock  Lower body assist        Toileting Toileting     Toileting steps completed by helper: Adjust clothing prior to toileting, Performs perineal hygiene, Adjust clothing after toileting    Toileting assist Assist level: Touching or steadying assistance (Pt.75%)   Transfers Chair/bed transfer   Chair/bed transfer method: Other Chair/bed transfer assist level: 2 helpers Chair/bed transfer assistive device: Mechanical lift, Other Mechanical lift: Stedy   Locomotion Ambulation Ambulation activity did not occur: Safety/medical  concerns         Wheelchair   Type: Manual Max wheelchair distance: 10 Assist Level: Touching or steadying assistance (Pt > 75%)  Cognition Comprehension Comprehension assist level: Understands basic 75 - 89% of the time/ requires cueing 10 - 24% of the time  Expression Expression assist level: Expresses basic 90% of the time/requires cueing < 10% of the time.  Social Interaction Social Interaction assist level: Interacts appropriately 75 - 89% of the time - Needs redirection for appropriate language or to initiate interaction.  Problem Solving Problem solving assist level: Solves basic 75 - 89% of the time/requires cueing 10 - 24% of the time  Memory Memory assist level: Recognizes or recalls 75 - 89% of the time/requires cueing 10 - 24% of the time    Medical Problem List and Plan: 1.  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 No brace required.   Cont CIR after starting anticoagulation and study complete 2.  DVT Prophylaxis/Anticoagulation:   Vascular study showing extensive DVT.  Pt made NPO and repeat scan order to determine extent of DVT  Spoke with Neurosurg and Heme/Onc Xarelto started 3. Pain Management: Robaxin and oxycodone as needed. Monitor with increased mobility 4. Mood: Paxil 20 mg daily, Provide emotional support 5. Neuropsych: This patient is capable of making decisions on her own behalf. 6. Skin/Wound Care: Routine skin checks 7. Fluids/Electrolytes/Nutrition: Routine I&Os 8. Hypertension. Norvasc 10 mg daily, Lopressor 100 mg twice a day.   Elevated BP, likely erroneous, otherwise relatively controlled 9. 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 10. History hypercoagulable state. INR 1.09. 11. Constipation. Laxative assistive 12. Hypokalemia  K+ 3.3 on 1/25  Supplemented x2 days  Labs ordered for Monday 13. Leukocytosis  WBCs 14.0 on  1/26  Afebrile  Improving 14. ABLA  Hb 9.6 on 1/26   Cont to monitor  Labs ordered for Monday 15. Thrombocytopenia  Plts 142 on 1/26  Labs ordered for Monday  Cont to monitor   LOS (Days) 2 A FACE TO FACE EVALUATION WAS PERFORMED  Jamair Cato Lorie Phenix 07/26/2016 1:27 PM

## 2016-07-26 NOTE — Progress Notes (Signed)
*  PRELIMINARY RESULTS* Vascular Ultrasound IVC/Iliac duplex has been completed.  Preliminary findings: Limited evaluation of IVC and Iliac veins due to large amounts of bowel gas even though patient was NPO prior to exam.  The visualized portions of the IVC are patent.  The right Iliac appears to contain nonocclusive thrombus. The left common iliac vein appears negative for deep vein thrombosis  Katrina Donaldson 07/26/2016, 3:36 PM

## 2016-07-26 NOTE — Progress Notes (Signed)
Occupational Therapy Note  Patient Details  Name: Katrina Donaldson MRN: [id number] Date of Birth: 03/16/1956  Today's Date: 07/26/2016 OT Missed Time: 81 Minutes Missed Time Reason: Patient on bedrest   Pt missed 60 min of skilled OT due to orders for bedrest until scan can be completed. Pt with +DVT. Will follow up as able and resume therapies once ordered.      Chrys Racer , MS, OTR/L, CLT  07/26/2016, 10:47 AM

## 2016-07-26 NOTE — Progress Notes (Signed)
Spoke with Dr. Marin Olp of hematology services who recommends to begin Xarelto for DVT

## 2016-07-26 NOTE — Progress Notes (Signed)
Pt refusing to eat all meals. Very poor intake with food and liquid.

## 2016-07-26 NOTE — Progress Notes (Addendum)
Occupational Therapy Session Note  Patient Details  Name: Katrina Donaldson MRN: [id number] Date of Birth: 01-23-1956  Today's Date: 07/26/2016 OT Individual Time: [id number] OT Individual Time Calculation (min): 90 min    Short Term Goals: Week 1:  OT Short Term Goal 1 (Week 1): To increase I with LB dressing, pt will sit to stand with mod A. OT Short Term Goal 2 (Week 1): Pt will use reacher to don pants over feet.  OT Short Term Goal 3 (Week 1): Pt will complete squat pivot transfer to toilet with mod A. OT Short Term Goal 4 (Week 1): Pt will tolerate standing with mod A for at least 1 min during toileting tasks,:     Skilled Therapeutic Interventions/Progress Updates: Bed rest orders lifted prior to session, RN also consulted with report of no therapy restrictions. Tx focus on ADL retraining, functional transfers, and cognition/attention. Pt was lying in bed at time of arrival and family present. Her lunch was still sitting in front of her, appeared to be untouched. Per RN, lunch had been in front of her for 2 hours. Pants were donned bedlevel with Total Ax2 and max cues for logroll technique and hand placement. Supine<sit completed with Max A, and transfer using [name] completed with 2 helpers to w/c. Pt then engaged in self feeding, requiring max cues for attention due to frequent episodes of staring off. Pt took 5 bites in total in 8 minutes. Afterwards she was taken to dayroom, engaged in cognitive task of flipping over upside down puzzle pieces. Pt completed 50% of task with max A and max cues for attention with bilateral UEs. Afterwards she was returned to room, provided with reacher and practiced simulated LB dressing of threading LEs into pants. Pt unable to coordinate lifting one LE while using reacher to thread leg into pants (though she could do each component separately). Pt required Max A to complete with extra time and max cues on adaptive strategies. Discussed transfer safety with RN,  concerns regarding [name] pad causing increased pressure/discomfort on back incision. Therefore safety plan updated for maximove transfers with nursing. At end of tx pt was left in w/c with all needs within reach and safety belt donned. 2/3 recall of back precautions today.      Therapy Documentation Precautions:  Precautions Precautions: Fall, Back Precaution Comments: recalled 2/3 back precautions Restrictions Weight Bearing Restrictions: No   Vital Signs: Therapy Vitals Temp: 99.1 F (37.3 C) Temp Source: Oral Pulse Rate: 90 Resp: 17 BP: (!) 233/74 Patient Position (if appropriate): Lying Oxygen Therapy SpO2: 98 % O2 Device: Not Delivered Pain: No c/o pain during session  Pain Assessment Faces Pain Scale: Hurts little more Pain Type: Acute pain Pain Location: Head Pain Descriptors / Indicators: Aching Pain Intervention(s): Medication (See eMAR) ADL: ADL ADL Comments: refer to functional navigator    See Function Navigator for Current Functional Status.   Therapy/Group: Individual Therapy  [name] A Kedar Sedano 07/26/2016, 4:27 PM

## 2016-07-27 ENCOUNTER — Inpatient Hospital Stay (HOSPITAL_COMMUNITY): Payer: BC Managed Care – PPO | Admitting: *Deleted

## 2016-07-27 ENCOUNTER — Inpatient Hospital Stay (HOSPITAL_COMMUNITY): Payer: BC Managed Care – PPO | Admitting: Occupational Therapy

## 2016-07-27 ENCOUNTER — Inpatient Hospital Stay (HOSPITAL_COMMUNITY): Payer: BC Managed Care – PPO | Admitting: Physical Therapy

## 2016-07-27 ENCOUNTER — Inpatient Hospital Stay (HOSPITAL_COMMUNITY): Payer: BC Managed Care – PPO

## 2016-07-27 NOTE — Progress Notes (Signed)
Patient ID: Katrina Donaldson, female   DOB: 1956/02/26, 61 y.o.   MRN: [id number]   07/27/16.  Katrina Donaldson is a 61 y.o. 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  Subjective: No new complaints. No new problems. Slept well. Feeling OK.  Past Medical History:  Diagnosis Date  . Brain aneurysm   . Depression   . History of DVT (deep vein thrombosis)   . Hypercoagulable state ([place])   . Hypertension   . Hypothyroidism   . Stroke [facility]) 2016   [facility] 11/29/14     Objective: Vital signs in last 24 hours: Temp:  [97.9 F (36.6 C)-99.1 F (37.3 C)] 97.9 F (36.6 C) (01/27 0508) Pulse Rate:  [90-96] 95 (01/27 0508) Resp:  [17-18] 18 (01/27 0508) BP: (128-233)/(67-81) 134/81 (01/27 0508) SpO2:  [98 %-100 %] 100 % (01/27 0508) Weight change:  Last BM Date: 07/25/16  Intake/Output from previous day: 01/26 0701 - 01/27 0700 In: 180 [P.O.:180] Out: -  Last cbgs: CBG (last 3)  No results for input(s): GLUCAP in the last 72 hours.   Physical Exam General: No apparent distress  obese HEENT: not dry neg Lungs: Normal effort. Lungs clear to auscultation, no crackles or wheezes. Cardiovascular: Regular rate and rhythm, no edema Abdomen: S/NT/ND; BS(+) Musculoskeletal:  unchanged Neurological: No new neurological deficits Skin: clear  Back incision healing  Mental state: Alert, oriented, distracted  BP Readings from Last 3 Encounters:  07/27/16 134/81  07/24/16 116/71  07/11/16 (!) (P) 148/91    Lab Results: BMET    Component Value Date/Time   NA 143 07/25/2016 0738   NA 133 (A) 12/14/2014   K 3.3 (L) 07/25/2016 0738   CL 106 07/25/2016 0738   CO2 28 07/25/2016 0738   GLUCOSE 145 (H) 07/25/2016 0738   BUN 16 07/25/2016 0738   BUN 8 12/14/2014   CREATININE 0.71 07/25/2016 0738   CALCIUM 8.7 (L) 07/25/2016 0738   GFRNONAA >60 07/25/2016  0738   GFRAA >60 07/25/2016 0738   CBC    Component Value Date/Time   WBC 14.0 (H) 07/26/2016 0607   RBC 3.54 (L) 07/26/2016 0607   HGB 9.6 (L) 07/26/2016 0607   HCT 29.7 (L) 07/26/2016 0607   PLT 142 (L) 07/26/2016 0607   MCV 83.9 07/26/2016 0607   MCH 27.1 07/26/2016 0607   MCHC 32.3 07/26/2016 0607   RDW 15.3 07/26/2016 0607   LYMPHSABS 2.1 07/26/2016 0607   MONOABS 1.5 (H) 07/26/2016 0607   EOSABS 0.1 07/26/2016 0607   BASOSABS 0.0 07/26/2016 0607     Medications: I have reviewed the patient's current medications.  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 DVT- on xarelto HTN- controlled ABLA -  Hgh 9.6 on 1/26  Length of stay, days: [place] , MD 07/27/2016, 10:56 AM

## 2016-07-27 NOTE — Progress Notes (Signed)
Occupational Therapy Session Note  Patient Details  Name: Katrina Donaldson MRN: [id number] Date of Birth: 11-Nov-1955  Today's Date: 07/27/2016 OT Individual Time: 1435-1506 OT Individual Time Calculation (min): 31 min    Short Term Goals: Week 1:  OT Short Term Goal 1 (Week 1): To increase I with LB dressing, pt will sit to stand with mod A. OT Short Term Goal 2 (Week 1): Pt will use reacher to don pants over feet.  OT Short Term Goal 3 (Week 1): Pt will complete squat pivot transfer to toilet with mod A. OT Short Term Goal 4 (Week 1): Pt will tolerate standing with mod A for at least 1 min during toileting tasks,    Skilled Therapeutic Interventions/Progress Updates: Pt seen for skilled OT session after PT handoff in dayroom. Tx focus on w/c mobility and parts identification, as well as reacher use. Pt required max cues for initiating w/c mobility, including HOH assistance for placing hands on w/c or brakes as needed. Pt continued to place arms on armrests when asked to locate brakes. Afterwards reacher training completed while seated in room, pt practiced opening/closing claw of reacher and practiced LB dressing with use of theraband for downgrading task demands. Pt still unable to thread 1 LE into pants. Max cues for attention and alertness, with pt often closing eyes during session. She was left in w/c with all needs within reach and safety belt donned at time of departure.      Therapy Documentation Precautions:  Precautions Precautions: Fall, Back Precaution Comments: recalled 2/3 back precautions Restrictions Weight Bearing Restrictions: No General:   Vital Signs: Therapy Vitals Temp: 98 F (36.7 C) Temp Source: Oral Pulse Rate: 86 Resp: 18 BP: 129/75 Patient Position (if appropriate): Sitting Oxygen Therapy SpO2: 95 % O2 Device: Not Delivered Pain: No c/o pain during session  Pain Assessment Pain Assessment: 0-10 Pain Score: 0-No pain Pain Type: Acute pain Pain  Location: Jaw Pain Orientation: Lower Pain Descriptors / Indicators: Aching Pain Frequency: Constant Pain Onset: On-going Patients Stated Pain Goal: 2 Pain Intervention(s): Medication (See eMAR) Multiple Pain Sites: No ADL: ADL ADL Comments: refer to functional navigator Exercises:   Other Treatments:    See Function Navigator for Current Functional Status.   Therapy/Group: Individual Therapy  Katrina Donaldson A [name] 07/27/2016, 4:26 PM

## 2016-07-27 NOTE — Progress Notes (Signed)
Physical Therapy Session Note  Patient Details  Name: Katrina Donaldson MRN: [id number] Date of Birth: [birth year]  Today's Date: 07/27/2016 PT Individual Time: 1120-1205 1345-1430 PT Individual Time Calculation (min): 45 min & 45 min  Short Term Goals: Week 1:  PT Short Term Goal 1 (Week 1): pt will perform functional transfers with max A PT Short Term Goal 2 (Week 1): pt will propel w/c with min A 25' PT Short Term Goal 3 (Week 1): pt will maintain sitting balance for functional task with min A  Skilled Therapeutic Interventions/Progress Updates: Tx1: Pt presented in bed after nsg performed toileting somnolent and orientated to place but not situation or time. Performed rolling L/R with maxA for pad placement. Performed bed to w/c transfer with [facility] for safety. Pt re-orientated to time and situation. Pt propelled to rehab gym for time management. Pt with poor positioning in w/c with an increased sacral positioning. Mirror placed in front of pt to provide visual feedback to re-orient to neutral. Pt with difficulty following commands requiring additional time for processing. PTA provide tactile cues for repositioning. Pt requesting return to room for void. Pt transported back to room instructed in w/c management attempting to lock/unlock breaks while awaiting for assistance.  Pt left in w/c awaiting NT with QRB on and visitor present.  Tx2: Pt presented in w/c with dgt present agreeable to therapy. Transported to rehab gym.  Pt c/o increased pain in R shoulder and jaw, nsg notified and pain med administered during session. Pt attempted LE seated therex  requiring AAROM for LAQ, hip flexion x 10 and tactile cues for move through range for pillow squeezes, and ankle pumps. Pt continued instruction in w/c management with hand over hand instruction for reaching for breaks and hand over hand for w/c propulsion. Pt would demonstrate minimal initiation of LUE for reaching for wheel rim or propulsion.  Session ended with handoff to OT for next session.      Therapy Documentation Precautions:  Precautions Precautions: Fall, Back Precaution Comments: recalled 2/3 back precautions Restrictions Weight Bearing Restrictions: No General: PT Amount of Missed Time (min): 15 Minutes PT Missed Treatment Reason: Toileting   See Function Navigator for Current Functional Status.   Therapy/Group: Individual Therapy  Annise Boran  Monque Haggar, PTA  07/27/2016, 4:33 PM

## 2016-07-27 NOTE — Progress Notes (Signed)
Occupational Therapy Session Note  Patient Details  Name: Katrina Donaldson MRN: [id number] Date of Birth: 06/23/1956  Today's Date: 07/27/2016 OT Individual Time: 0800-0900 OT Individual Time Calculation (min): 60 min   Short Term Goals: Week 1:  OT Short Term Goal 1 (Week 1): To increase I with LB dressing, pt will sit to stand with mod A. OT Short Term Goal 2 (Week 1): Pt will use reacher to don pants over feet.  OT Short Term Goal 3 (Week 1): Pt will complete squat pivot transfer to toilet with mod A. OT Short Term Goal 4 (Week 1): Pt will tolerate standing with mod A for at least 1 min during toileting tasks,  Skilled Therapeutic Interventions/Progress Updates: ADL-retraining at bed level with focus on improved initiation, sequencing, problem-solving, bed mobility, transfers and adapted bathing/dressing skills.   Pt received supine in bed alert and cooperative but disoriented to place and time and requiring continuous vc to progress through session due to delayed processing of instructions.   Pt required extra time and max vc to complete self-feeding, bed mobility, transfer, bathing and dressing with tactile cues to initiate each task .  Pt unable to lift either leg during bed mobility and required max assist to bathe and dress lower body at bed level.   Pt then required mod assist to roll to her left with max assist to lift trunk to rise to sit at EOB.   Pt able to sustain static sitting balance att EOB using bed rail as directed and completed sit-stand and stand-pivot transfer to w/c using RW with bed height elevated to 28."   Transfer required manual facilitation to shift-weight anterior, vc to sequence, and vc to weight-shift to left leg and side step to her right durinerg stand-pivot.  Pt bathed and dressed upper body at sink with max assist d/t delayed processing.   Pt assisted back to bed at end of session d/t fatigue.   All needs placed within reach.     Therapy  Documentation Precautions:  Precautions Precautions: Fall, Back Precaution Comments: recalled 2/3 back precautions Restrictions Weight Bearing Restrictions: No   Pain: Pain Assessment Pain Assessment: 0-10 Pain Score: 0-No pain Pain Type: Acute pain Pain Location: Back Pain Orientation: Lower Pain Descriptors / Indicators: Aching Pain Frequency: Constant Pain Onset: On-going Patients Stated Pain Goal: 2 Pain Intervention(s): Medication (See eMAR) Multiple Pain Sites: No   ADL: ADL ADL Comments: refer to functional navigator   See Function Navigator for Current Functional Status.   Therapy/Group: Individual Therapy  [place] 07/27/2016, 12:31 PM

## 2016-07-28 LAB — URINALYSIS, ROUTINE W REFLEX MICROSCOPIC
Bilirubin Urine: NEGATIVE
Glucose, UA: NEGATIVE mg/dL
HGB URINE DIPSTICK: NEGATIVE
Ketones, ur: 5 mg/dL — AB
Leukocytes, UA: NEGATIVE
NITRITE: NEGATIVE
PROTEIN: NEGATIVE mg/dL
SPECIFIC GRAVITY, URINE: 1.023 (ref 1.005–1.030)
pH: 6 (ref 5.0–8.0)

## 2016-07-28 NOTE — Progress Notes (Signed)
Social Work  Social Work Assessment and Plan  Patient Details  Name: Katrina Donaldson MRN: [id number] Date of Birth: 02/12/1956  Today's Date: 07/26/2016  Problem List:  Patient Active Problem List   Diagnosis Date Noted  . Poor appetite   . Acute blood loss anemia   . Post-operative pain   . Hypokalemia   . Leukocytosis   . Thrombocytopenia ([place])   . Acute deep vein thrombosis (DVT) of femoral vein of left lower extremity ([place])   . Myelopathy ([place]) 07/24/2016  . Thoracic myelopathy   . Depression   . Benign essential HTN   . History of subarachnoid hemorrhage   . Hypercoagulable state ([place])   . Constipation due to pain medication   . 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 Protein-calorie malnutrition ([place]) 12/21/2014  . Thyroid activity decreased 12/21/2014  . 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 Essential hypertension 12/13/2014  . Anemia, iron deficiency 12/13/2014  . Hypothyroidism 12/13/2014  . Prediabetes 12/13/2014   Past Medical History:  Past Medical History:  Diagnosis Date  . Brain aneurysm   . Depression   . History of DVT (deep vein thrombosis)   . Hypercoagulable state ([place])   . Hypertension   . Hypothyroidism   . Stroke [facility]) 2016   [facility] 11/29/14   Past Surgical History:  Past Surgical History:  Procedure Laterality Date  . ABDOMINAL HYSTERECTOMY     patient denies  . BRAIN SURGERY     aneurysm  . goiter    . LAMINECTOMY N/A 07/19/2016   Procedure: LAMINECTOMY THORACIC FOUR THORACIC FIVE, THORACIC SIX TO THORACIC EIGHT,THORACIC TEN TO LUMBAR ONE, FENESTRATION OF ARACHNOID CYSTS;  Surgeon: Consuella Lose, MD;  Location: [place];  Service: Neurosurgery;  Laterality: N/A;  . THYROIDECTOMY     Social History:  reports that she has never smoked. She has never used smokeless tobacco. She reports that she does not drink alcohol or use drugs.  Family / Support Systems Marital Status: Widow/Widower How Long?:  1996 Patient Roles: Parent, Other (Comment) (sister) Children: daughter, [facility]) [phone]; son, Katrina Donaldson) @ [phone] Other Supports: sister, Katrina Donaldson (lives with pt) @ [phone] Anticipated Caregiver: sister and other family members Ability/Limitations of Caregiver: sister works 53 am until 42 pm.  Brother-in-law in the home with the pt otherwise, however, pt reports that she does not feel they are as reliable as she had expected they would be when they moved in. Caregiver Availability: 24/7 Family Dynamics: As noted, pt reports that her sister and brother-in-law have not offered as much assistance as she had expected.  She prefers that her daughter be her primary contact.  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  Occasionally appears to "zone out" mid response and states, "sometimes I need help with this stuff...".  She expresses frustration with her living situation.  Denies any significant emotional distress but could benefit from neuropsychology consult to assess further. Recent Psychosocial Issues: brain aneurysm 2016 and sent to SNF;  sister and brother-in-law moved in with her  in July 2017 Pyschiatric History: Chart notes h/o depression - she reports this began following her husband and mother's deaths which were very close to one another.  Medication but no formal  counseling. Substance Abuse History: None  Patient / Family Perceptions, Expectations & Goals Pt/Family understanding of illness & functional limitations: Pt with basic understanding of the spinal cyst and surgery performed.  Basic understanding of her functional limtations /need for CIR. Premorbid pt/family roles/activities: Pt was independent overall PTA Anticipated changes in roles/activities/participation: Pt with min assist goals overall which indicate family will need to (reliably) assume caregiver roles. Pt/family expectations/goals: "I want to get home but I know I need this (rehab)."  [employer]: Other (Comment) ([place] after aneurysm) Premorbid Home Care/DME Agencies: None Transportation available at discharge: yes Resource referrals recommended: Neuropsychology  Discharge Planning Living Arrangements: Other relatives Support Systems: Children, Other relatives, Friends/neighbors Type of Residence: Private residence Insurance Resources: Multimedia programmer (specify) Printmaker) Financial Screen Referred: No Living Expenses: Motgage Does the patient have any problems obtaining your medications?: No Home Management: Pt notes minimal assistance with this from sister Patient/Family Preliminary Plans: Pt expects to return home with sister and brother-in-law Barriers to Discharge: Family Support (concern they will provide needed assistance) Social Work Anticipated Follow Up Needs: HH/OP Expected length of stay: 21 days  Clinical Impression Unfortunate woman here following finding of spinal cyst and undergoing surgery and now with functional limitations.  Known h/o aneurysm rupture in 2016 with SNF placement following.  Sister and brother-in-law in the home with her, however, pt notes they do not provide as much support as she had expected they would.  Still need to speak with pt's daughter who lives in [place] to discuss d/c planning.  Pt with min  assist goals, therefore, will need a reliable support system at home.  Pt denies any significant emotional distress, however, will monitor and refer for neuropsych as indicated.    Katrina Donaldson 07/26/2016, 12:09 PM

## 2016-07-28 NOTE — Progress Notes (Signed)
Patient ID: Katrina Donaldson, female   DOB: 05/30/1956, 61 y.o.   MRN: [id number]   07/28/2016.  Katrina Donaldson is a 61 y.o. 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   Subjective: No new complaints. No new problems.  Feeling well.  Past Medical History:  Diagnosis Date  . Brain aneurysm   . Depression   . History of DVT (deep vein thrombosis)   . Hypercoagulable state ([place])   . Hypertension   . Hypothyroidism   . Stroke [facility]) 2016   [facility] 11/29/14     Objective: Vital signs in last 24 hours: Temp:  [98 F (36.7 C)-98.4 F (36.9 C)] 98.4 F (36.9 C) (01/28 0606) Pulse Rate:  [86-90] 90 (01/28 0606) Resp:  [16-18] 16 (01/28 0606) BP: (114-129)/(66-75) 114/66 (01/28 0606) SpO2:  [95 %] 95 % (01/27 1559) Weight change:  Last BM Date: 07/25/16  Intake/Output from previous day: 01/27 0701 - 01/28 0700 In: 180 [P.O.:180] Out: 200 [Urine:200] Last cbgs: CBG (last 3)  No results for input(s): GLUCAP in the last 72 hours.  BP Readings from Last 3 Encounters:  07/28/16 114/66  07/24/16 116/71  07/11/16 (!) (P) 148/91    Physical Exam General: No apparent distress  overweight HEENTneg Lungs: Normal effort. Lungs clear to auscultation, no crackles or wheezes. Cardiovascular: Regular rate and rhythm, no edema Abdomen: S/NT/ND; BS(+) Musculoskeletal:  unchanged Neurological: No new neurological deficits Wounds: back incision healed Skin: clear  Mental state: Alert, oriented, cooperative    Lab Results: BMET    Component Value Date/Time   NA 143 07/25/2016 0738   NA 133 (A) 12/14/2014   K 3.3 (L) 07/25/2016 0738   CL 106 07/25/2016 0738   CO2 28 07/25/2016 0738   GLUCOSE 145 (H) 07/25/2016 0738   BUN 16 07/25/2016 0738   BUN 8 12/14/2014   CREATININE 0.71 07/25/2016 0738   CALCIUM 8.7 (L) 07/25/2016 0738   GFRNONAA >60 07/25/2016 0738   GFRAA >60 07/25/2016 0738   CBC    Component Value Date/Time   WBC 14.0 (H) 07/26/2016 0607   RBC 3.54 (L) 07/26/2016 0607   HGB 9.6 (L) 07/26/2016 0607   HCT 29.7 (L) 07/26/2016 0607   PLT 142 (L) 07/26/2016 0607   MCV 83.9 07/26/2016 0607   MCH 27.1 07/26/2016 0607   MCHC 32.3 07/26/2016 0607   RDW 15.3 07/26/2016 0607   LYMPHSABS 2.1 07/26/2016 0607   MONOABS 1.5 (H) 07/26/2016 0607   EOSABS 0.1 07/26/2016 0607   BASOSABS 0.0 07/26/2016 0607    Studies/Results: No results found.  Medications: I have reviewed the patient's current medications.  Assessment/Plan:  Myelopathy secondary to spinal arachnoid cyst, s/p lamenectomy. Continue CIR HTN- stable DVT- on Xarelto ABLA-  Hgh 9.6 on 1/26    Length of stay, days: [place] , MD 07/28/2016, 11:28 AM

## 2016-07-28 NOTE — Plan of Care (Signed)
Problem: RH PAIN MANAGEMENT Goal: RH STG PAIN MANAGED AT OR BELOW PT'S PAIN GOAL Pain < or =3 with min assistance  C/o of pain at levels above 3

## 2016-07-29 ENCOUNTER — Inpatient Hospital Stay (HOSPITAL_COMMUNITY): Payer: BC Managed Care – PPO

## 2016-07-29 ENCOUNTER — Inpatient Hospital Stay (HOSPITAL_COMMUNITY): Payer: BC Managed Care – PPO | Admitting: Physical Therapy

## 2016-07-29 ENCOUNTER — Inpatient Hospital Stay (HOSPITAL_COMMUNITY): Payer: BC Managed Care – PPO | Admitting: Occupational Therapy

## 2016-07-29 DIAGNOSIS — R630 Anorexia: Secondary | ICD-10-CM

## 2016-07-29 LAB — CBC WITH DIFFERENTIAL/PLATELET
BASOS ABS: 0 10*3/uL (ref 0.0–0.1)
BASOS PCT: 0 %
EOS ABS: 0.1 10*3/uL (ref 0.0–0.7)
Eosinophils Relative: 1 %
HCT: 30.8 % — ABNORMAL LOW (ref 36.0–46.0)
Hemoglobin: 10.4 g/dL — ABNORMAL LOW (ref 12.0–15.0)
LYMPHS PCT: 14 %
Lymphs Abs: 1.6 10*3/uL (ref 0.7–4.0)
MCH: 27.7 pg (ref 26.0–34.0)
MCHC: 33.8 g/dL (ref 30.0–36.0)
MCV: 82.1 fL (ref 78.0–100.0)
MONO ABS: 1 10*3/uL (ref 0.1–1.0)
Monocytes Relative: 9 %
NEUTROS ABS: 8.9 10*3/uL — AB (ref 1.7–7.7)
NEUTROS PCT: 76 %
PLATELETS: 380 10*3/uL (ref 150–400)
RBC: 3.75 MIL/uL — ABNORMAL LOW (ref 3.87–5.11)
RDW: 15.4 % (ref 11.5–15.5)
WBC: 11.6 10*3/uL — ABNORMAL HIGH (ref 4.0–10.5)

## 2016-07-29 LAB — BASIC METABOLIC PANEL
ANION GAP: 11 (ref 5–15)
BUN: 13 mg/dL (ref 6–20)
CO2: 23 mmol/L (ref 22–32)
CREATININE: 0.91 mg/dL (ref 0.44–1.00)
Calcium: 8.1 mg/dL — ABNORMAL LOW (ref 8.9–10.3)
Chloride: 102 mmol/L (ref 101–111)
GLUCOSE: 109 mg/dL — AB (ref 65–99)
Potassium: 3.7 mmol/L (ref 3.5–5.1)
Sodium: 136 mmol/L (ref 135–145)

## 2016-07-29 LAB — URINE CULTURE: CULTURE: NO GROWTH

## 2016-07-29 MED ORDER — MIRTAZAPINE 15 MG PO TABS
15.0000 mg | ORAL_TABLET | Freq: Every day | ORAL | Status: DC
  Administered 2016-07-29 – 2016-08-23 (×26): 15 mg via ORAL
  Filled 2016-07-29 (×27): qty 1

## 2016-07-29 NOTE — Progress Notes (Signed)
Occupational Therapy Session Note  Patient Details  Name: Katrina Donaldson MRN: [id number] Date of Birth: [birth year]  Today's Date: 07/29/2016 OT Individual Time: [id number] Individual Time Calculation (min): 77 min    Short Term Goals: Week 1:  OT Short Term Goal 1 (Week 1): To increase I with LB dressing, pt will sit to stand with mod A. OT Short Term Goal 2 (Week 1): Pt will use reacher to don pants over feet.  OT Short Term Goal 3 (Week 1): Pt will complete squat pivot transfer to toilet with mod A. OT Short Term Goal 4 (Week 1): Pt will tolerate standing with mod A for at least 1 min during toileting tasks,  Skilled Therapeutic Interventions/Progress Updates: Pt was lying in bed at time of arrival. Tx focus on ADL retraining, cognitive remediation, initiation, and following 1 step instructions. Pants donned with Total A, pt able to momentarily lift each leg when instructed to do so. Max A for rolling with max cues on logroll technique to lift pants over hips. Pts hands needed to be manually placed on bedrails due to pt hovering hands above rails when directed to assist with bed mobility. Afterwards pt completed supine<sit with Max A. Lateral scoot transfer completed with 2 helpers and Total A. Pts hands manually placed on mattress to assist with transfer instead of holding onto therapist. Afterwards she completed UB dressing at sink with Max A and max cues for attention to task. Lotion and deodorant application utilized for attention and sequencing basic self care tasks. Pt began applying deoderent with cap on and required cues to acknowledge errror. Pt able to dispense lotion into her hands but required cues for maintaining attention instead of staring off or closing her eyes. At end of tx pt was left in w/c with safety belt donned and all needs within reach at time of departure.      Therapy Documentation Precautions:  Precautions Precautions: Fall, Back Precaution Comments: recalled  2/3 back precautions Restrictions Weight Bearing Restrictions: No  Pain: No c/o pain during session    ADL: ADL ADL Comments: refer to functional navigator    See Function Navigator for Current Functional Status.   Therapy/Group: Individual Therapy  Herberth Deharo A Pauline Pegues 07/29/2016, 7:10 PM

## 2016-07-29 NOTE — Progress Notes (Signed)
Occupational Therapy Session Note  Patient Details  Name: Katrina Donaldson MRN: [id number] Date of Birth: 03/20/1956  Today's Date: 07/29/2016 OT Individual Time: 1130-1200 OT Individual Time Calculation (min): 30 min    Short Term Goals: Week 1:  OT Short Term Goal 1 (Week 1): To increase I with LB dressing, pt will sit to stand with mod A. OT Short Term Goal 2 (Week 1): Pt will use reacher to don pants over feet.  OT Short Term Goal 3 (Week 1): Pt will complete squat pivot transfer to toilet with mod A. OT Short Term Goal 4 (Week 1): Pt will tolerate standing with mod A for at least 1 min during toileting tasks,  Skilled Therapeutic Interventions/Progress Updates:    Upon entering the room, pt supine in bed with no c/o pain and reports, "i'm not feeling this today." Pt declined OT intervention and OT providing pt with education for importance of participation. During this conversation, pt incontinent of BM and soiled clothing and bed linen. Pt needing max A for rolling L <> R for hygiene and max multimodal cues for technique as pt continues to close eyes throughout this session. Pt required total A for hygiene and clothing management. Pt remained in wheelchair with call bell and all needed items within reach.   Therapy Documentation Precautions:  Precautions Precautions: Fall, Back Precaution Comments: recalled 2/3 back precautions Restrictions Weight Bearing Restrictions: No General:   Vital Signs: Therapy Vitals Pulse Rate: 98 BP: 130/72 Pain:   ADL: ADL ADL Comments: refer to functional navigator Exercises:   Other Treatments:    See Function Navigator for Current Functional Status.   Therapy/Group: Individual Therapy  Gypsy Decant 07/29/2016, 12:35 PM

## 2016-07-29 NOTE — Progress Notes (Signed)
Occupational Therapy Session Note  Patient Details  Name: Althia Egolf MRN: [id number] Date of Birth: 1955/09/20  Today's Date: 07/29/2016 OT Individual Time: 4232-0094 OT Individual Time Calculation (min): 40 min    Short Term Goals: Week 1:  OT Short Term Goal 1 (Week 1): To increase I with LB dressing, pt will sit to stand with mod A. OT Short Term Goal 2 (Week 1): Pt will use reacher to don pants over feet.  OT Short Term Goal 3 (Week 1): Pt will complete squat pivot transfer to toilet with mod A. OT Short Term Goal 4 (Week 1): Pt will tolerate standing with mod A for at least 1 min during toileting tasks,  Skilled Therapeutic Interventions/Progress Updates:    1:1 OT session focused on LB dressing and bed mobility. Pt asleep upon OT arrival, awoke easily, but lethargic throughout session w/ max multimodal cues + increased time to initiate self-care activity and to maintain alertness.. Pt incontinent of bladder requiring total A for peri-care and brief change.  Total A for bed level LB dressing today and max/total A for rolling side to side 6x. Pt left semi-reclined asleep in bed at end of session with needs met.   Therapy Documentation Precautions:  Precautions Precautions: Fall, Back Precaution Comments: recalled 2/3 back precautions Restrictions Weight Bearing Restrictions: No Pain:  no complaints of pain ADL: ADL ADL Comments: refer to functional navigator  See Function Navigator for Current Functional Status.   Therapy/Group: Individual Therapy  Valma Cava 07/29/2016, 11:32 AM

## 2016-07-29 NOTE — Progress Notes (Signed)
Katrina Phenix, NP to look at results of Ct. She is aware and will talk with Jeannene Patella, MD in the morning.

## 2016-07-29 NOTE — Progress Notes (Signed)
Physical Therapy Session Note  Patient Details  Name: Katrina Donaldson MRN: [id number] Date of Birth: 11/12/1955  Today's Date: 07/29/2016 PT Individual Time: 0800-0900 PT Individual Time Calculation (min): 60 min   Short Term Goals: Week 1:  PT Short Term Goal 1 (Week 1): pt will perform functional transfers with max A PT Short Term Goal 2 (Week 1): pt will propel w/c with min A 25' PT Short Term Goal 3 (Week 1): pt will maintain sitting balance for functional task with min A  Skilled Therapeutic Interventions/Progress Updates: Pt presented in bed finishing breakfast. Pt requesting coffee which PTA provided, encouraged pt to use BUE to prepare coffee. Pt stating thought she was going home yesterday. Provided pt education on role of therapy to facilitate return to home. Encouraged pt to sit at EOB, performed supine to sit at EOB with maxA x1 and max cues for sequencing of hand placement and LE placement. Pt with delayed processing with all commands. Pt able to tolerate sitting at EOB x 6 min and able to perform LAQ x 10 bilaterally with mod cues. Pt required max multimodal cues for engaging abdominals to maintain neutral. Pt required maxA to return to supine. Required x2 for boosting to [facility]. Pt left in bed with alarm on and all current needs met.   Therapy Documentation Precautions:  Precautions Precautions: Fall, Back Precaution Comments: recalled 2/3 back precautions Restrictions Weight Bearing Restrictions: No General:   Vital Signs: Therapy Vitals Pulse Rate: 98 BP: 130/72   See Function Navigator for Current Functional Status.   Therapy/Group: Individual Therapy  Shams Fill  Chazlyn Cude, PTA  07/29/2016, 10:23 AM

## 2016-07-29 NOTE — Progress Notes (Signed)
[facility] PHYSICAL MEDICINE & REHABILITATION     PROGRESS NOTE  Subjective/Complaints:  Pt seen sitting up in bed this AM.  She states she is "not feeling it this morning".  She slept well overnight.  Weekend notes reviewed, no issues.   ROS: Denies CP, SOB, N/V/D.  Objective: Vital Signs: Blood pressure 130/72, pulse 98, temperature 99.3 F (37.4 C), temperature source Oral, resp. rate 18, height 5\' 5"  (1.651 m), weight 86 kg (189 lb 9.5 oz), SpO2 95 %. No results found. No results for input(s): WBC, HGB, HCT, PLT in the last 72 hours. No results for input(s): NA, K, CL, GLUCOSE, BUN, CREATININE, CALCIUM in the last 72 hours.  Invalid input(s): CO CBG (last 3)  No results for input(s): GLUCAP in the last 72 hours.  Wt Readings from Last 3 Encounters:  07/24/16 86 kg (189 lb 9.5 oz)  07/19/16 90.8 kg (200 lb 2.8 oz)  07/21/15 85.7 kg (189 lb)    Physical Exam:  BP 130/72   Pulse 98   Temp 99.3 F (37.4 C) (Oral)   Resp 18   Ht 5\' 5"  (1.651 m)   Wt 86 kg (189 lb 9.5 oz)   SpO2 95%   BMI 31.55 kg/m  Constitutional: She appears well-developed. Obese  HENT: Normocephalic and atraumatic.  Eyes: EOM are normal. No discharge. Cardiovascular: RRR. No JVD. Respiratory: Effort normal and breath sounds normal.  GI: Soft. Bowel sounds are normal.   Musculoskeletal: She exhibits no edema or tenderness.  Neurological: She is alert and oriented x1.  Follows full commands.  Motor: LUE: 4+/5 proximal to distal LLE: 4/5 HF,KE, 4+/5 ADF/PF, previously, today with minimal movement (?participation) RUE: 4+/5 proximal to distal RLE: HF, KE 4-/5, ADF/PF 4+/5 previously, today with minimal movement (?participation) Skin: Skin is warm and dry.  Back dressing intact  Psychiatric: She has a normal mood and affect.   Assessment/Plan: 1. Functional deficits secondary to myelopathy which require 3+ hours per day of interdisciplinary therapy in a comprehensive inpatient rehab  setting. Physiatrist is providing close team supervision and 24 hour management of active medical problems listed below. Physiatrist and rehab team continue to assess barriers to discharge/monitor patient progress toward functional and medical goals.  Function:  Bathing Bathing position   Position: Bed  Bathing parts Body parts bathed by patient: Right upper leg, Left upper leg, Chest, Abdomen Body parts bathed by helper: Left upper leg, Right lower leg, Back, Buttocks, Front perineal area, Right arm, Left arm  Bathing assist Assist Level:  (Max assist)      Upper Body Dressing/Undressing Upper body dressing   What is the patient wearing?: Pull over shirt/dress     Pull over shirt/dress - Perfomed by patient: Thread/unthread right sleeve, Thread/unthread left sleeve Pull over shirt/dress - Perfomed by helper: Put head through opening, Pull shirt over trunk        Upper body assist Assist Level:  (Moderate assist)      Lower Body Dressing/Undressing Lower body dressing   What is the patient wearing?: Underwear, Pants, Non-skid slipper socks   Underwear - Performed by helper: Thread/unthread right underwear leg, Thread/unthread left underwear leg, Pull underwear up/down   Pants- Performed by helper: Thread/unthread right pants leg, Thread/unthread left pants leg, Pull pants up/down, Fasten/unfasten pants   Non-skid slipper socks- Performed by helper: Don/doff right sock, Don/doff left sock                  Lower body assist Assist  for lower body dressing:  (total assist)      Toileting Toileting Toileting activity did not occur: N/A   Toileting steps completed by helper: Adjust clothing prior to toileting, Performs perineal hygiene, Adjust clothing after toileting    Toileting assist Assist level: Touching or steadying assistance (Pt.75%)   Transfers Chair/bed transfer   Chair/bed transfer method: Stand pivot Chair/bed transfer assist level: Moderate assist (Pt  50 - 74%/lift or lower) Chair/bed transfer assistive device: Mechanical lift Mechanical lift: Maximove   Locomotion Ambulation Ambulation activity did not occur: Safety/medical concerns         Wheelchair   Type: Manual Max wheelchair distance: 10 Assist Level: Touching or steadying assistance (Pt > 75%)  Cognition Comprehension Comprehension assist level: Understands basic 75 - 89% of the time/ requires cueing 10 - 24% of the time  Expression Expression assist level: Expresses basic 90% of the time/requires cueing < 10% of the time.  Social Interaction Social Interaction assist level: Interacts appropriately 75 - 89% of the time - Needs redirection for appropriate language or to initiate interaction.  Problem Solving Problem solving assist level: Solves basic 75 - 89% of the time/requires cueing 10 - 24% of the time  Memory Memory assist level: Recognizes or recalls 75 - 89% of the time/requires cueing 10 - 24% of the time    Medical Problem List and Plan: 1.  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 No brace required.   Cont CIR  Will speak with team regarding pt's cognitive and functional status 2.  DVT Prophylaxis/Anticoagulation:   Vascular study showing extensive DVT, including right iliac, but IVC appears to be clean  Xarelto started 3. Pain Management: Robaxin and oxycodone as needed. Monitor with increased mobility 4. Mood: Paxil 20 mg daily, Provide emotional support 5. Neuropsych: This patient is capable of making decisions on her own behalf. 6. Skin/Wound Care: Routine skin checks 7. Fluids/Electrolytes/Nutrition: Routine I&Os  Poor intake, remeron started 1/29 8. Hypertension. Norvasc 10 mg daily, Lopressor 100 mg twice a day.   Controlled 1/29 9. 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 10. History hypercoagulable state. INR 1.09. 11. Constipation.  Laxative assistive 12. Hypokalemia  K+ 3.3 on 1/25  Supplemented x2 days  Labs pending 13. Leukocytosis  WBCs 14.0 on 1/26  Labs pending  Afebrile  Improving 14. ABLA  Hb 9.6 on 1/26   Cont to monitor  Labs pending 15. Thrombocytopenia  Plts 142 on 1/26  Labs pending  Cont to monitor   LOS (Days) 5 A FACE TO FACE EVALUATION WAS PERFORMED  Sentoria Brent Lorie Phenix 07/29/2016 9:55 AM

## 2016-07-29 NOTE — Progress Notes (Signed)
Physical Therapy Session Note  Patient Details  Name: Katrina Donaldson MRN: [id number] Date of Birth: 09-16-1955  Today's Date: 07/29/2016 PT Individual Time: 1420-1450 PT Individual Time Calculation (min): 30 min   Short Term Goals: Week 1:  PT Short Term Goal 1 (Week 1): pt will perform functional transfers with max A PT Short Term Goal 2 (Week 1): pt will propel w/c with min A 25' PT Short Term Goal 3 (Week 1): pt will maintain sitting balance for functional task with min A  Skilled Therapeutic Interventions/Progress Updates:   Session focused on addressing functional tasks to engage in mobility and cognitive remediation to address decreased processing, problem solving, awareness, initiation, and attention. Pt states she won't be here long and is leaving to go home soon. Educated on reason for being here on CIR and reoriented but pt just giggled stating, "I know." pt demonstrated decreased awareness and functional use of LUE during functional task to throw away tissue after blowing her nose (would not turn to the L to locate relationship of tissue and trashcan location) and during attempts at w/c propulsion. Despite hand over hand assist to place LUE on wheel, pt unable to sequence w/c propulsion and would not use LUE functionally to attempt this. Pt continues to giggle throughout session. Attempted sit <> stand in [place] to address strength and postural control in standing, but despite significant amount of extra time and 2 attempts, pt unable to complete full sit <> stand (partially lifted up bottom with max assist). Upon return to room, pt reports needing to have BM. NT and RN present for handoff to attempt [facility] transfer. Recommending maxi move at this time.  Notified MD and PA of increasing difficulty with cognition and mobility today.   Therapy Documentation Precautions:  Precautions Precautions: Fall, Back Precaution Comments: recalled 2/3 back precautions Restrictions Weight Bearing  Restrictions: No   Pain:  No complaints of pain. Reports being cold .   See Function Navigator for Current Functional Status.   Therapy/Group: Individual Therapy  Canary Brim Ivory Broad, PT, DPT  07/29/2016, 3:00 PM

## 2016-07-29 NOTE — Progress Notes (Signed)
Patient participated in therapy but cognition remained un attentive and required multiple questions for patient to answer.Patient reports staff is asking her questions that is "too hard". Staff questions ex: Are you finished using the bedpan? Patient reports "i don't know". Therapy also reported patient is less attentive today. DBurman Riis, PA notified and stat CT ordered. Continue with plan of care. Katrina Donaldson

## 2016-07-29 NOTE — Care Management Note (Signed)
[facility] Individual Statement of Services  Patient Name:  Katrina Donaldson  Date:  07/26/2016  Welcome to the [facility].  Our goal is to provide you with an individualized program based on your diagnosis and situation, designed to meet your specific needs.  With this comprehensive rehabilitation program, you will be expected to participate in at least 3 hours of rehabilitation therapies Monday-Friday, with modified therapy programming on the weekends.  Your rehabilitation program will include the following services:  Physical Therapy (PT), Occupational Therapy (OT), Speech Therapy (ST), 24 hour per day rehabilitation nursing, Therapeutic Recreaction (TR), Neuropsychology, Case Management (Social Worker), Rehabilitation Medicine, Nutrition Services and Pharmacy Services  Weekly team conferences will be held on Wednesdays to discuss your progress.  Your Social Worker will talk with you frequently to get your input and to update you on team discussions.  Team conferences with you and your family in attendance may also be held.  Expected length of stay: 21 days  Overall anticipated outcome: minimal assist  Depending on your progress and recovery, your program may change. Your Social Worker will coordinate services and will keep you informed of any changes. Your Social Worker's name and contact numbers are listed  below.  The following services may also be recommended but are not provided by the [facility] will be made to provide these services after discharge if needed.  Arrangements include referral to agencies that provide these services.  Your insurance has been verified to be:  [employer] Your primary doctor is:  Dr. Nancy Fetter  Pertinent information will be shared with your doctor and your insurance company.  Social  Worker:  [place], [place] or ([phone]   Information discussed with and copy given to patient by: Lennart Pall, 07/26/2016, 12:12 PM

## 2016-07-30 ENCOUNTER — Inpatient Hospital Stay (HOSPITAL_COMMUNITY): Payer: BC Managed Care – PPO | Admitting: Physical Therapy

## 2016-07-30 ENCOUNTER — Inpatient Hospital Stay (HOSPITAL_COMMUNITY): Payer: BC Managed Care – PPO | Admitting: Occupational Therapy

## 2016-07-30 ENCOUNTER — Inpatient Hospital Stay (HOSPITAL_COMMUNITY): Payer: BC Managed Care – PPO | Admitting: Speech Pathology

## 2016-07-30 DIAGNOSIS — G919 Hydrocephalus, unspecified: Secondary | ICD-10-CM

## 2016-07-30 DIAGNOSIS — R410 Disorientation, unspecified: Secondary | ICD-10-CM

## 2016-07-30 NOTE — Progress Notes (Signed)
Occupational Therapy Session Note  Patient Details  Name: Katrina Donaldson MRN: [id number] Date of Birth: [birth year]  Today's Date: 07/30/2016 OT Individual Time: 11:00-12:00    60 min      Short Term Goals: Week 1:  OT Short Term Goal 1 (Week 1): To increase I with LB dressing, pt will sit to stand with mod A. OT Short Term Goal 2 (Week 1): Pt will use reacher to don pants over feet.  OT Short Term Goal 3 (Week 1): Pt will complete squat pivot transfer to toilet with mod A. OT Short Term Goal 4 (Week 1): Pt will tolerate standing with mod A for at least 1 min during toileting tasks,  Skilled Therapeutic Interventions/Progress Updates:    Upon entering room, Pt seated in w/c with eyes closed. Pt demo decreased arousal throughout session with repeated cues and increased time to attend to task. Focus on session on functional mobility and self care retraining.   Functional mobility: Pt propels w/c 10 feet in hallways with VC cues to locate w/c wheels and HOH of LUE to propel w/c. Pt unable to attend to L side despite physical or verbal cues. Pt completes slide board transfer w/c to mat with MAX A x2 with knee blocking, facilitation of trunk flexion. During transfer, OT provided direct VC for head-hip relationship, initiation of slide, and hand placement. Pt sits in front of mirror on edge of mat with MOD A and VC for trunk alignment and righting after LOB. Pt stands with MAX A x2 with B knee block in standing walker for ~15 seconds before requiring rest break. Pt reports feeling like she will have a BM and pt completes squat pivot transfer mat>w/c with similar cues as written previously.  Pt completes squat pivot transfer from w/c<>padded commode chair over the toilet with MAX A x2 for knee blocking and facilitation of trunk flexion with direct VC for hand/foot placement during transfer. Pt stands with RW and grab bars with MAX A x2 for knee block and hip extension while secondary OT facilitates  pants up/down hips and completes peri/posterior hygiene after BM. OT provides simple step by step cueing to open/close reacher and thread legs into pull up pants. Pt demo difficulty sequencing opening/closing reacher onto pant leg to thread over foot. Pt transfers to w/c as written above and left in w/c with call light in reach and safety belt secure.  Therapy Documentation Precautions:  Precautions Precautions: Fall, Back Precaution Comments: recalled 2/3 back precautions Restrictions Weight Bearing Restrictions: No General: General PT Missed Treatment Reason: Patient fatigue;Other (Comment) (lethargy) Vital Signs:   Pain:   ADL: ADL ADL Comments: refer to functional navigator Exercises:   Other Treatments:    See Function Navigator for Current Functional Status.   Therapy/Group: Individual Therapy  Tonny Branch 07/30/2016, 12:29 PM

## 2016-07-30 NOTE — Progress Notes (Signed)
Physical Therapy Session Note  Patient Details  Name: Katrina Donaldson MRN: [id number] Date of Birth: Aug 01, 1955  Today's Date: 07/30/2016 PT Individual Time: 0845-0950 PT Individual Time Calculation (min): 65 min   Short Term Goals: Week 1:  PT Short Term Goal 1 (Week 1): pt will perform functional transfers with max A PT Short Term Goal 2 (Week 1): pt will propel w/c with min A 25' PT Short Term Goal 3 (Week 1): pt will maintain sitting balance for functional task with min A  Skilled Therapeutic Interventions/Progress Updates: Pt presented in bed alert and orientated to time, place, and situation. Pt requesting to use toilet and agreeable to attempt BSC. Pt required maxA for supine to sit transfer with HOB elevated and use of bed rails. Pt able to sit unsupported at EOB x5 min with modA. Pt requiring tactile cues to maintain neutral.  Trial sit to/from stand to [facility] with [name] lift. Pt able to maintain upright with mod verbal cues for engaging LE and glutes for improved upright posture. Pt able to tolerate standing with [name] x1 min donning brief and pants. After continued rest on [employer] pt performed third sit to/from stand with [name] to w/c requiring +2 for safety/guarding. Pt denies increased pain only fatigue and increased lethargy throughout session.  Once pt transferred to w/c PTA providing multimodal cues for achieving neutral in w/c.  Pt encouraged to stay in w/c after therapy session and left with QRB in place, call bell within reach and all current needs met.      Therapy Documentation Precautions:  Precautions Precautions: Fall, Back Precaution Comments: recalled 2/3 back precautions Restrictions Weight Bearing Restrictions: No General: PT Amount of Missed Time (min): 10 Minutes PT Missed Treatment Reason: Patient fatigue;Other (Comment) (lethargy)   See Function Navigator for Current Functional Status.   Therapy/Group: Individual Therapy  Evans Levee  Corvette Orser,  PTA  07/30/2016, 10:39 AM

## 2016-07-30 NOTE — Evaluation (Addendum)
Speech Language Pathology Assessment and Plan  Patient Details  Name: Katrina Donaldson MRN: [id number] Date of Birth: [birth year]  SLP Diagnosis: Cognitive Impairments;Speech and Language deficits  Rehab Potential: Good ELOS: about 20 days     Today's Date: 07/30/2016 SLP Individual Time: 1400-1500 SLP Individual Time Calculation (min): 60 min   Problem List:  Patient Active Problem List   Diagnosis Date Noted  . Confusion   . Hydrocephalus   . Poor appetite   . Acute blood loss anemia   . Post-operative pain   . Hypokalemia   . Leukocytosis   . Thrombocytopenia ([place])   . Acute deep vein thrombosis (DVT) of femoral vein of left lower extremity ([place])   . Myelopathy ([place]) 07/24/2016  . Thoracic myelopathy   . Depression   . Benign essential HTN   . History of subarachnoid hemorrhage   . Hypercoagulable state ([place])   . Constipation due to pain medication   . 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 Protein-calorie malnutrition ([place]) 12/21/2014  . Thyroid activity decreased 12/21/2014  . 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 Essential hypertension 12/13/2014  . Anemia, iron deficiency 12/13/2014  . Hypothyroidism 12/13/2014  . Prediabetes 12/13/2014   Past Medical History:  Past Medical History:  Diagnosis Date  . Brain aneurysm   . Depression   . History of DVT (deep vein thrombosis)   . Hypercoagulable state ([place])   . Hypertension   . Hypothyroidism   . Stroke [facility]) 2016   [facility] 11/29/14   Past Surgical History:  Past Surgical History:  Procedure Laterality Date  . ABDOMINAL HYSTERECTOMY     patient denies  . BRAIN SURGERY     aneurysm  . goiter    . LAMINECTOMY N/A 07/19/2016   Procedure: LAMINECTOMY THORACIC FOUR THORACIC FIVE, THORACIC SIX TO THORACIC EIGHT,THORACIC TEN TO LUMBAR ONE, FENESTRATION OF ARACHNOID CYSTS;  Surgeon: Consuella Lose, MD;  Location: [place];  Service: Neurosurgery;  Laterality: N/A;  .  THYROIDECTOMY      Assessment / Plan / Recommendation Clinical Impression Shakerria Parran a 61 y.o.right handed femalewith history of hypertension, hypercoagulable state, [facility] 11/29/2014 requiring [place]. Per chart review patient independent with a caneprior to admission. Multilevel with 5 steps to entry. She has a sister and brother-in-law and their daughter recently moved into the downstairs of the home.Family in and out and cannot provide 24-hour care.Presented 07/19/2016 with gait disorder with decreased balance and some intermittent falls. MRI cervical thoracic and lumbar spine reviewed, showing large loculated arachnoid cyst with spinal cord compressionextending from the top of the thoracic spine to the upper lumbar spine.Underwent T11-L1 laminectomy, T4-T7 laminectomy, fenestration of arachnoid cyst 07/19/2016 per Dr. Kathyrn Sheriff. Hospital course complicated by pain management. No back brace required post op. Physical occupational therapy evaluations completed with recommendations of physical medicine rehabilitation consult. Patient was admitted for a comprehensive rehabilitation program 07/24/16. Patient was evaluated by OT and PT with note of cognitive deficits as a result, SLE orders were also received.   Cognitive-linguistic evaluation complete.  Patient completed the [facility] Cognitive Assessment, version 8.1 and demonstrates skills consistent with a score of 10/30 with 26 or greater considered to be [facility].  Cognitive impairments characterized by inconsistent orientation, impaired initiation and timely cessation of basic verbal and functional tasks.  Perseveration and impaired selective attention, as well as storage and retrieval of new information are also imapired areas.  These deficits impact the patient's overall safety with basic self-care tasks as  well as her ability to communicate basic needs and wants. Patient would benefit from skilled SLP intervention in order to  maximize their functional independence prior to discharge. Anticipate patient will require 24 hour supervision at home and follow up SLP services.    Skilled Therapeutic Interventions          Skilled treatment initiated with focus on addressing cognition goals. SLP facilitated session by providing Max assist verbal cues for redirection following mild environmental distractions (from hallway with the door closed) during the assessment.  Continue with current plan of care.    SLP Assessment  Patient will need skilled [facility] during CIR admission    Recommendations  Recommendations for Other Services: Neuropsych consult Patient destination: Home Follow up Recommendations: 24 hour supervision/assistance;Outpatient SLP;Home Health SLP Equipment Recommended: None recommended by SLP    SLP Frequency 3 to 5 out of 7 days   SLP Duration  SLP Intensity  SLP Treatment/Interventions about 20 days   Minumum of 1-2 x/day, 30 to 90 minutes  Cognitive remediation/compensation;Cueing hierarchy;Environmental controls;Functional tasks;Internal/external aids;Medication managment;Patient/family education;Speech/Language facilitation;Therapeutic Activities    Pain Pain Assessment Pain Assessment: No/denies pain Faces Pain Scale: Hurts whole lot Pain Type: Acute pain Pain Location: Back Pain Intervention(s): Medication (See eMAR);Repositioned  Prior Functioning Cognitive/Linguistic Baseline: Within functional limits Type of Home: House  Lives With: Family Available Help at Discharge: Family;Available PRN/intermittently (per patient report ) Education: 12th grade, retired Optometrist with [employer] Vocation: Retired  Function:   Cognition Comprehension Comprehension assist level: Understands basic 50 - 74% of the time/ requires cueing 25 - 49% of the time  Expression   Expression assist level: Expresses basic 25 - 49% of the time/requires cueing 50 - 75% of the  time. Uses single words/gestures.  Social Interaction Social Interaction assist level: Interacts appropriately 50 - 74% of the time - May be physically or verbally inappropriate.  Problem Solving Problem solving assist level: Solves basic 25 - 49% of the time - needs direction more than half the time to initiate, plan or complete simple activities  Memory Memory assist level: Recognizes or recalls less than 25% of the time/requires cueing greater than 75% of the time   Short Term Goals: Week 1: SLP Short Term Goal 1 (Week 1): Patient will utilize external aids to reorient herself in 3 consecutive sessions with Mod assist question cues  SLP Short Term Goal 2 (Week 1): Patient will verbally respond to questions with no more than 2 reps in 90% of opportunities.  SLP Short Term Goal 3 (Week 1): Patient will demonstrate initiation and cessation of basic tasks with increased wait time and Mod assist verbal cues to self-monitor errors.  SLP Short Term Goal 4 (Week 1): Patient will select attention to task given mild environmental distractions for 2 minute increments with Mod assist verbal cues for redirection.  SLP Short Term Goal 5 (Week 1): Patient will demonstrate basic problem solving during funcitonal tasks with Mod assist question cues.   SLP Short Term Goal 6 (Week 1): Patient will verbally identify 2 physical and 1 cognitive deficits with Mod question cues.   Refer to Care Plan for Long Term Goals  Recommendations for other services: Neuropsych  Discharge Criteria: Patient will be discharged from SLP if patient refuses treatment 3 consecutive times without medical reason, if treatment goals not met, if there is a change in medical status, if patient makes no progress towards goals or if patient is discharged from hospital.  The above  assessment, treatment plan, treatment alternatives and goals were discussed and mutually agreed upon: by patient  Carmelia Roller.,  CCC-SLP [phone]  [place] 07/30/2016, 4:58 PM

## 2016-07-30 NOTE — Progress Notes (Signed)
Pt unable to void within 8 hrs. Bladder scan 350cc with I&O cath 500cc. Will continue to monitor urine output and need for I&O cath.

## 2016-07-30 NOTE — Progress Notes (Signed)
[facility] PHYSICAL MEDICINE & REHABILITATION     PROGRESS NOTE  Subjective/Complaints:  Pt seen sitting up in bed this AM.  She states she slept well overnight.  CT performed yesterday after discussion with therapies, showing hydrocephalus.   ROS: Denies CP, SOB, N/V/D.  Objective: Vital Signs: Blood pressure 128/83, pulse 79, temperature 98.4 F (36.9 C), temperature source Oral, resp. rate 16, height 5\' 5"  (1.651 m), weight 86 kg (189 lb 9.5 oz), SpO2 95 %. Ct Head Wo Contrast  Result Date: 07/29/2016 CLINICAL DATA:  61 year old hypertensive female with confusion. History of brain aneurysm. Initial encounter. EXAM: CT HEAD WITHOUT CONTRAST TECHNIQUE: Contiguous axial images were obtained from the base of the skull through the vertex without intravenous contrast. COMPARISON:  12/07/2015 head CT. FINDINGS: Brain: Post placement of right frontal shunt catheter which has been removed. Interval development of moderate right-sided hydrocephalus. Aqueduct appears patent. Encephalomalacia right frontal lobe where patient had a prior parenchymal hemorrhage. 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 No acute intracranial hemorrhage. No intracranial mass lesion noted on this unenhanced exam. 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 Skull: No acute abnormality. Sinuses/Orbits: No acute orbital abnormality.  Mild exophthalmos. Visualized sinuses, mastoid air cells and middle ear cavities are clear. Other: Negative. IMPRESSION: Interval development of moderate right-sided hydrocephalus. Aqueduct appears patent. Encephalomalacia right frontal lobe where patient had a prior parenchymal hemorrhage. 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 No acute intracranial hemorrhage. 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 Electronically Signed   By:  Genia Del M.D.   On: 07/29/2016 17:18    Recent Labs  07/29/16 1519  WBC 11.6*  HGB 10.4*  HCT 30.8*  PLT 380    Recent Labs  07/29/16 1519  NA 136  K 3.7  CL 102  GLUCOSE 109*  BUN 13  CREATININE 0.91  CALCIUM 8.1*   CBG (last 3)  No results for input(s): GLUCAP in the last 72 hours.  Wt Readings from Last 3 Encounters:  07/24/16 86 kg (189 lb 9.5 oz)  07/19/16 90.8 kg (200 lb 2.8 oz)  07/21/15 85.7 kg (189 lb)    Physical Exam:  BP 128/83 (BP Location: Right Arm)   Pulse 79   Temp 98.4 F (36.9 C) (Oral)   Resp 16   Ht 5\' 5"  (1.651 m)   Wt 86 kg (189 lb 9.5 oz)   SpO2 95%   BMI 31.55 kg/m  Constitutional: She appears well-developed. Obese  HENT: Normocephalic and atraumatic.  Eyes: EOM are normal. No discharge. Cardiovascular: RRR. No JVD. Respiratory: Effort normal and breath sounds normal.  GI: Soft. Bowel sounds are normal.   Musculoskeletal: She exhibits no edema or tenderness.  Neurological: She is alert and oriented x1.  Follows full commands.  Motor: LUE: 4+/5 proximal to distal LLE: 4/5 HF,KE, 4+/5 ADF/PF, previously, today HF, KE 2/5, ADF/PF 4/5 (?participation) RUE: 4+/5 proximal to distal RLE: HF, KE 4-/5, ADF/PF 4+/5 previously, today HF, KE 2/5, ADF/PF 4/5 (?participation) Skin: Skin is warm and dry.  Back dressing intact  Psychiatric: She has a normal mood and affect.   Assessment/Plan: 1. Functional deficits secondary to myelopathy which require 3+ hours per day of interdisciplinary therapy in a comprehensive inpatient rehab setting. Physiatrist is providing close team supervision and 24 hour management of active medical problems listed below. Physiatrist and rehab team continue to assess barriers  to discharge/monitor patient progress toward functional and medical goals.  Function:  Bathing Bathing position   Position: Bed  Bathing parts Body parts bathed by patient: Right upper leg, Left upper leg, Chest, Abdomen Body parts  bathed by helper: Left upper leg, Right lower leg, Back, Buttocks, Front perineal area, Right arm, Left arm  Bathing assist Assist Level:  (Max assist)      Upper Body Dressing/Undressing Upper body dressing   What is the patient wearing?: Pull over shirt/dress     Pull over shirt/dress - Perfomed by patient: Put head through opening Pull over shirt/dress - Perfomed by helper: Thread/unthread right sleeve, Thread/unthread left sleeve, Pull shirt over trunk        Upper body assist Assist Level:  (Max A)      Lower Body Dressing/Undressing Lower body dressing   What is the patient wearing?: Non-skid slipper socks, Pants   Underwear - Performed by helper: Thread/unthread right underwear leg, Thread/unthread left underwear leg, Pull underwear up/down   Pants- Performed by helper: Thread/unthread right pants leg, Thread/unthread left pants leg, Pull pants up/down   Non-skid slipper socks- Performed by helper: Don/doff right sock, Don/doff left sock                  Lower body assist Assist for lower body dressing:  (Total A)      Toileting Toileting Toileting activity did not occur: N/A   Toileting steps completed by helper: Adjust clothing prior to toileting, Performs perineal hygiene, Adjust clothing after toileting (per Celedonio Miyamoto, NT)    Toileting assist Assist level: Touching or steadying assistance (Pt.75%)   Transfers Chair/bed transfer   Chair/bed transfer method: Stand pivot Chair/bed transfer assist level: Moderate assist (Pt 50 - 74%/lift or lower) Chair/bed transfer assistive device: Mechanical lift Mechanical lift: Maximove   Locomotion Ambulation Ambulation activity did not occur: Safety/medical concerns         Wheelchair   Type: Manual Max wheelchair distance: 5' Assist Level: Total assistance (Pt < 25%)  Cognition Comprehension Comprehension assist level: Understands basic 25 - 49% of the time/ requires cueing 50 - 75% of the time   Expression Expression assist level: Expresses basic 50 - 74% of the time/requires cueing 25 - 49% of the time. Needs to repeat parts of sentences.  Social Interaction Social Interaction assist level: Interacts appropriately 50 - 74% of the time - May be physically or verbally inappropriate.  Problem Solving Problem solving assist level: Solves basic 25 - 49% of the time - needs direction more than half the time to initiate, plan or complete simple activities  Memory Memory assist level: Recognizes or recalls less than 25% of the time/requires cueing greater than 75% of the time    Medical Problem List and Plan: 1.  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 No brace required.   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  Also recommended repeat CT angio in 6 months, unclear if this was performed.  Will speak with Neurosurg. 2.  DVT Prophylaxis/Anticoagulation:   Vascular study showing extensive DVT, including right iliac, but IVC appears to be clean  Xarelto started 3. Pain Management: Robaxin and oxycodone as needed. Monitor with increased mobility 4. Mood: Paxil 20 mg daily, Provide emotional support 5. Neuropsych:  This patient is capable of making decisions on her own behalf. 6. Skin/Wound Care: Routine skin checks 7. Fluids/Electrolytes/Nutrition: Routine I&Os  Poor intake, remeron started 1/29 8. Hypertension. Norvasc 10 mg daily, Lopressor 100 mg twice a day.   Controlled 1/30 9. 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 10. History hypercoagulable state. INR 1.09. 11. Constipation. Laxative assistive 12. Hypokalemia  K+ 3.7 on 1/29  Supplemented x2 days 13. Leukocytosis: Improving  WBCs 11.6 on  1/29  Afebrile  Improving 14. ABLA  Hb 10.4 on 1/29   Cont to monitor 15. Thrombocytopenia: Resolved  Plts 380 on 1/29  Cont to monitor   LOS (Days) 6 A FACE TO FACE EVALUATION WAS PERFORMED  Ankit Lorie Phenix 07/30/2016 9:55 AM

## 2016-07-31 ENCOUNTER — Inpatient Hospital Stay (HOSPITAL_COMMUNITY): Payer: BC Managed Care – PPO | Admitting: Physical Therapy

## 2016-07-31 ENCOUNTER — Inpatient Hospital Stay (HOSPITAL_COMMUNITY): Payer: BC Managed Care – PPO | Admitting: Speech Pathology

## 2016-07-31 ENCOUNTER — Inpatient Hospital Stay (HOSPITAL_COMMUNITY): Payer: BC Managed Care – PPO | Admitting: Occupational Therapy

## 2016-07-31 MED ORDER — ACETAMINOPHEN 650 MG RE SUPP: 650.0000 mg | Freq: Four times a day (QID) | RECTAL | Status: DC

## 2016-07-31 MED ORDER — ACETAMINOPHEN 650 MG RE SUPP
650.0000 mg | Freq: Four times a day (QID) | RECTAL | Status: DC
  Filled 2016-07-31 (×2): qty 1

## 2016-07-31 MED ORDER — ACETAMINOPHEN 325 MG PO TABS: 650.0000 mg | ORAL_TABLET | Freq: Four times a day (QID) | ORAL | Status: DC

## 2016-07-31 MED ORDER — PRO-STAT SUGAR FREE PO LIQD
30.0000 mL | Freq: Every day | ORAL | Status: DC
  Administered 2016-08-01 – 2016-08-07 (×7): 30 mL via ORAL
  Filled 2016-07-31 (×7): qty 30

## 2016-07-31 MED ORDER — SODIUM CHLORIDE 0.45 % IV SOLN
INTRAVENOUS | Status: AC
  Administered 2016-07-31: 16:00:00 via INTRAVENOUS

## 2016-07-31 MED ORDER — SODIUM CHLORIDE 0.45 % IV SOLN: Freq: Once | INTRAVENOUS | Status: DC

## 2016-07-31 MED ORDER — ACETAMINOPHEN 325 MG PO TABS
650.0000 mg | ORAL_TABLET | Freq: Four times a day (QID) | ORAL | Status: DC
  Administered 2016-07-31 – 2016-08-07 (×26): 650 mg via ORAL
  Filled 2016-07-31 (×27): qty 2

## 2016-07-31 NOTE — Progress Notes (Signed)
Pt unable to void. I&O cath 1545 for 300cc dark, foul smell present. BP 89/55 with 99.1 temp. Very poor intake and low output. Marlowe Sh[place], PA notified with new orders to start 1/2 NS 20ml/hr until 7am. BMET ordered for 5am.

## 2016-07-31 NOTE — Progress Notes (Signed)
Spoke with neurosurgery Dr. Kathyrn Sheriff in regards to latest CT showing right sided hydrocephalus. At this time recommendations are to repeat scan in 3 months and patient to follow-up 2 weeks after discharge regarding recent surgery for thoracolumbar spinal arachnoid cyst

## 2016-07-31 NOTE — Progress Notes (Signed)
Occupational Therapy Session Note  Patient Details  Name: Katrina Donaldson MRN: [id number] Date of Birth: [birth year]  Today's Date: 07/31/2016 OT Individual Time: [id number] OT Individual Time Calculation (min): 27 min    Short Term Goals: Week 1:  OT Short Term Goal 1 (Week 1): To increase I with LB dressing, pt will sit to stand with mod A. OT Short Term Goal 2 (Week 1): Pt will use reacher to don pants over feet.  OT Short Term Goal 3 (Week 1): Pt will complete squat pivot transfer to toilet with mod A. OT Short Term Goal 4 (Week 1): Pt will tolerate standing with mod A for at least 1 min during toileting tasks,  Skilled Therapeutic Interventions/Progress Updates:    Upon entering the room, pt seated in wheelchair awaiting therapist. Pt agreeable to grooming tasks at sink. Pt needing mod multimodal cues and increased time to initiate and sequence tasks to brush teeth, wash face, and comb hair this session. Pt did have spontaneous conversation that was appropriate to current situation this session. Pt remained seated in wheelchair with call bell and quick release belt donned.   Therapy Documentation Precautions:  Precautions Precautions: Fall, Back Precaution Comments: recalled 2/3 back precautions Restrictions Weight Bearing Restrictions: No General:   Vital Signs: Therapy Vitals Temp: 99.1 F (37.3 C) Temp Source: Oral Pulse Rate: 81 Resp: 18 BP: (!) 89/55 (RN notified) Patient Position (if appropriate): Sitting Oxygen Therapy SpO2: 97 % O2 Device: Not Delivered Pain: Pain Assessment Faces Pain Scale: Hurts a little bit Pain Type: Acute pain Pain Location: Back Pain Intervention(s): Medication (See eMAR) ADL: ADL ADL Comments: refer to functional navigator Exercises:   Other Treatments:    See Function Navigator for Current Functional Status.   Therapy/Group: Individual Therapy  Gypsy Decant 07/31/2016, 4:52 PM

## 2016-07-31 NOTE — Progress Notes (Signed)
Nutrition Follow-up  DOCUMENTATION CODES:   Obesity unspecified  INTERVENTION:  Continue Ensure Enlive po TID, each supplement provides 350 kcal and 20 grams of protein.  Provide 30 ml Prostat po once daily, each supplement provides 100 kcal and 15 grams of protein.   Encourage adequate PO intake.   NUTRITION DIAGNOSIS:   Inadequate oral intake related to  (dislike of food at meals) as evidenced by per patient/family report, meal completion < 50%; improving  GOAL:   Patient will meet greater than or equal to 90% of their needs; progressing  MONITOR:   PO intake, Supplement acceptance, Labs, Weight trends, Skin, I & O's  REASON FOR ASSESSMENT:   Malnutrition Screening Tool    ASSESSMENT:   61 y.o. right handed female with history of hypertension, hypercoagulable state, [facility] 11/29/2014 requiring surgery in [facility].  Presented 07/19/2016 with gait disorder with decreased balance and some intermittent falls. 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 Underwent T11-L1 laminectomy, T4-T7 laminectomy, fenestration of arachnoid cyst 07/19/2016   Meal completion has been 10-50% with 50% at breakfast this AM. Pt reports her appetite is starting to improving and she has been eating more at her meals. Pt currently has Ensure ordered with varied consuming. Pt does report liking the taste of them. RD to continue with current orders. RD to additionally order Prostat to aid in adequate protein needs. Pt encouraged to eat her foods at meals. Labs and medications reviewed.   Diet Order:  Diet regular Room service appropriate? Yes; Fluid consistency: Thin  Skin:   (Incision on back)  Last BM:  1/30  Height:   Ht Readings from Last 1 Encounters:  07/24/16 5\' 5"  (1.651 m)    Weight:   Wt Readings from Last 1 Encounters:  07/24/16 189 lb 9.5  oz (86 kg)    Ideal Body Weight:  56.8 kg  BMI:  Body mass index is 31.55 kg/m.  Estimated Nutritional Needs:   Kcal:  1700-1900  Protein:  80-95 grams  Fluid:  1.7 - 1.9 L/day  EDUCATION NEEDS:   No education needs identified at this time  Corrin Parker, MS, RD, LDN Pager # [phone] After hours/ weekend pager # [phone]

## 2016-07-31 NOTE — Progress Notes (Signed)
Occupational Therapy Session Note  Patient Details  Name: Katrina Donaldson MRN: [id number] Date of Birth: Jun 11, 1956  Today's Date: 07/31/2016 OT Individual Time:8:00  - 9:00 and 1:45-2:12    60 min and 42 min   Short Term Goals: Week 1:  OT Short Term Goal 1 (Week 1): To increase I with LB dressing, pt will sit to stand with mod A. OT Short Term Goal 2 (Week 1): Pt will use reacher to don pants over feet.  OT Short Term Goal 3 (Week 1): Pt will complete squat pivot transfer to toilet with mod A. OT Short Term Goal 4 (Week 1): Pt will tolerate standing with mod A for at least 1 min during toileting tasks,  Skilled Therapeutic Interventions/Progress Updates:    Session 1: Upon entering room, Pt supine in bed with head elevated and demo improved arousal and orientation.  Pt able to state the day of the week and maintain eye contact during entire conversation. Focus of session on activity tolerance, self care retraining, and bed mobility. With MAX A to facilitate legs in adherance with back precatutions, Pt comes seated to EOB. Pt req MIN A, encouragement and VC to maintain trunk alignment when sitting EOB to self-feed with 1 rest break. Pt reports not liking the food and only takes 2 bites. Educated pt on importance of eating and nutrition to maintain energy req throughout therapy. With washcloth and clothes placed forward and to L to facilitate trunk flexion, Pt searches for items to bathe and dress with VC to attend to L side. Pt req extra time and encouragement to wash UB thoroughly on L side. Pt refuses to bathe LB. Pt reports increased pain with activity and nurse is notified. Pt puts on deodorant and dons pull over shirt seated EOB with MIN A to pull trunk down back and encouragement to complete task as pt reports fatigue. Overall pt seated EOB ~30 min during session with intermittent rest breaks. With MAX A x2 to facilitate trunk and legs in adherance with back precautions pt sitting>supine in  bed. Pt able to state 2/3 back precautions prior to rolling. Pt req TOTAL A to don pants and MOD A to roll R/L to advance pants over hips. OT assists pt to assume figure 4 position in supine, and threads sock over toes and pt completes pulling sock over heel. Pt left in supine with all needs met and call light in reach.   Session 2: Upon arrival pt supine in bed with HOB elevated and food tray in front of her with ~5 bites of food consumed. Pt agreeable to tx this session and awake. Pt req MAX A supine>sitting EOB to move legs over edge and facilitate trunk alignment. Using sarah steady, pt stands with TOTAL A and VC to maintain hand placement on sara arm rests to help pull up to stand. OT advances pants down and transfers onto [facility]. Pt sits on BSC and attempt to void, but is unable. Pt TOTAL A with sara steady with VC for hand placement on arm rests of steady and sits in w/c. Pt educated on importance to eat meals for nutrition and energy to participate in therapy. Pt requests lunch tray and proceeds to eat cake on lunch tray. Pt left in w/c with call light in reach, safety belt in place and all needs met.   Therapy Documentation Precautions:  Precautions Precautions: Fall, Back Precaution Comments: recalled 2/3 back precautions Restrictions Weight Bearing Restrictions: No General:   Vital Signs:  Pain:   ADL: ADL ADL Comments: refer to functional navigator Exercises:   Other Treatments:    See Function Navigator for Current Functional Status.   Therapy/Group: Individual Therapy  Tonny Branch 07/31/2016, 9:13 AM

## 2016-07-31 NOTE — Progress Notes (Signed)
[place] PHYSICAL MEDICINE & REHABILITATION     PROGRESS NOTE  Subjective/Complaints:  Pt seen laying in bed this AM.  She slept well overnight. She is more alert and oriented today.  She was given a task by nursing, which she recalled.  Confirmed with nursing.    ROS: Denies CP, SOB, N/V/D.  Objective: Vital Signs: Blood pressure 113/71, pulse 74, temperature 98.3 F (36.8 C), temperature source Oral, resp. rate 17, height 5\' 5"  (1.651 m), weight 86 kg (189 lb 9.5 oz), SpO2 95 %. Ct Head Wo Contrast  Result Date: 07/29/2016 CLINICAL DATA:  61 year old hypertensive female with confusion. History of brain aneurysm. Initial encounter. EXAM: CT HEAD WITHOUT CONTRAST TECHNIQUE: Contiguous axial images were obtained from the base of the skull through the vertex without intravenous contrast. COMPARISON:  12/07/2015 head CT. FINDINGS: Brain: Post placement of right frontal shunt catheter which has been removed. Interval development of moderate right-sided hydrocephalus. Aqueduct appears patent. Encephalomalacia right frontal lobe where patient had a prior parenchymal hemorrhage. 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 No acute intracranial hemorrhage. No intracranial mass lesion noted on this unenhanced exam. 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 Skull: No acute abnormality. Sinuses/Orbits: No acute orbital abnormality.  Mild exophthalmos. Visualized sinuses, mastoid air cells and middle ear cavities are clear. Other: Negative. IMPRESSION: Interval development of moderate right-sided hydrocephalus. Aqueduct appears patent. Encephalomalacia right frontal lobe where patient had a prior parenchymal hemorrhage. 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 No acute intracranial hemorrhage. 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  Electronically Signed   By: Genia Del M.D.   On: 07/29/2016 17:18    Recent Labs  07/29/16 1519  WBC 11.6*  HGB 10.4*  HCT 30.8*  PLT 380    Recent Labs  07/29/16 1519  NA 136  K 3.7  CL 102  GLUCOSE 109*  BUN 13  CREATININE 0.91  CALCIUM 8.1*   CBG (last 3)  No results for input(s): GLUCAP in the last 72 hours.  Wt Readings from Last 3 Encounters:  07/24/16 86 kg (189 lb 9.5 oz)  07/19/16 90.8 kg (200 lb 2.8 oz)  07/21/15 85.7 kg (189 lb)    Physical Exam:  BP 113/71 (BP Location: Right Arm)   Pulse 74   Temp 98.3 F (36.8 C) (Oral)   Resp 17   Ht 5\' 5"  (1.651 m)   Wt 86 kg (189 lb 9.5 oz)   SpO2 95%   BMI 31.55 kg/m  Constitutional: She appears well-developed. Obese  HENT: Normocephalic and atraumatic.  Eyes: EOM are normal. No discharge. Cardiovascular: RRR. No JVD. Respiratory: Effort normal and breath sounds normal.  GI: Soft. Bowel sounds are normal.   Musculoskeletal: She exhibits tenderness. RLE edema. Neurological: She is alert and oriented x3 with cues for date.  Follows full commands.  Motor: LUE: 4+/5 proximal to distal LLE: 4/5 HF,KE, 4+/5 ADF/PF RUE: 4+/5 proximal to distal RLE: HF, KE 4-/5, ADF/PF 4+/5 previously, today HF, KE 2/5, ADF/PF 4/5 (?participation) Skin: Skin is warm and dry.  Back dressing intact  Psychiatric: She has a normal mood and affect.   Assessment/Plan: 1. Functional deficits secondary to myelopathy which require 3+ hours per day of interdisciplinary therapy in a comprehensive inpatient rehab setting. Physiatrist is providing close team supervision and 24 hour management of active medical problems listed below. Physiatrist and rehab team  continue to assess barriers to discharge/monitor patient progress toward functional and medical goals.  Function:  Bathing Bathing position   Position: Sitting EOB  Bathing parts Body parts bathed by patient: Right upper leg, Left upper leg, Chest, Abdomen Body parts bathed  by helper: Left upper leg, Right lower leg, Back, Buttocks, Front perineal area, Right arm, Left arm  Bathing assist Assist Level: Touching or steadying assistance(Pt > 75%)      Upper Body Dressing/Undressing Upper body dressing   What is the patient wearing?: Pull over shirt/dress     Pull over shirt/dress - Perfomed by patient: Thread/unthread right sleeve, Thread/unthread left sleeve, Put head through opening Pull over shirt/dress - Perfomed by helper: Pull shirt over trunk        Upper body assist Assist Level:  (Max A)      Lower Body Dressing/Undressing Lower body dressing   What is the patient wearing?: Pants   Underwear - Performed by helper: Thread/unthread right underwear leg, Thread/unthread left underwear leg, Pull underwear up/down Pants- Performed by patient: Thread/unthread right pants leg, Thread/unthread left pants leg (using reacher) Pants- Performed by helper: Thread/unthread right pants leg, Thread/unthread left pants leg, Pull pants up/down   Non-skid slipper socks- Performed by helper: Don/doff right sock, Don/doff left sock                  Lower body assist Assist for lower body dressing: 2 Helpers      Toileting Toileting Toileting activity did not occur: N/A   Toileting steps completed by helper: Adjust clothing prior to toileting, Performs perineal hygiene, Adjust clothing after toileting    Toileting assist Assist level: Two helpers   Transfers Chair/bed transfer   Chair/bed transfer method: Stand pivot Chair/bed transfer assist level: 2 helpers Chair/bed transfer assistive device: Mechanical lift Mechanical lift: Teacher, music activity did not occur: Safety/medical concerns         Wheelchair   Type: Manual Max wheelchair distance: 5' Assist Level: Total assistance (Pt < 25%)  Cognition Comprehension Comprehension assist level: Understands basic 50 - 74% of the time/ requires cueing 25 - 49% of the  time  Expression Expression assist level: Expresses basic 25 - 49% of the time/requires cueing 50 - 75% of the time. Uses single words/gestures.  Social Interaction Social Interaction assist level: Interacts appropriately 50 - 74% of the time - May be physically or verbally inappropriate.  Problem Solving Problem solving assist level: Solves basic 25 - 49% of the time - needs direction more than half the time to initiate, plan or complete simple activities  Memory Memory assist level: Recognizes or recalls less than 25% of the time/requires cueing greater than 75% of the time    Medical Problem List and Plan: 1.  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 No brace required.   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  Also recommended repeat CT angio in 6 months, unclear if this was performed.    Per Neurosurg (please see PA note), repeat scan in 3 months.   Appears cognitively improved today 2.  DVT Prophylaxis/Anticoagulation:   Vascular study showing extensive DVT, including right iliac, but IVC appears to be clean  Xarelto started 3. Pain Management: Robaxin and oxycodone as needed.  Monitor with increased mobility 4. Mood: Paxil 20 mg daily, Provide emotional support 5. Neuropsych: This patient is capable of making decisions on her own behalf. 6. Skin/Wound Care: Routine skin checks 7. Fluids/Electrolytes/Nutrition: Routine I&Os  Poor intake, remeron started 1/29 8. Hypertension. Norvasc 10 mg daily, Lopressor 100 mg twice a day.   Controlled 1/31 9. 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 10. History hypercoagulable state. INR 1.09. 11. Constipation. Laxative assistive 12. Hypokalemia  K+ 3.7 on  1/29  Supplemented x2 days  Labs ordered for tomorrow 13. Leukocytosis: Improving  WBCs 11.6 on 1/29  Afebrile  Labs ordered for tomorrow  Improving 14. ABLA  Hb 10.4 on 1/29   Labs ordered for tomorrow  Cont to monitor 15. Thrombocytopenia: Resolved  Plts 380 on 1/29  Cont to monitor   LOS (Days) 7 A FACE TO FACE EVALUATION WAS PERFORMED  Jaquawn Saffran Lorie Phenix 07/31/2016 1:45 PM

## 2016-07-31 NOTE — Progress Notes (Signed)
Speech Language Pathology Daily Session Note  Patient Details  Name: Katrina Donaldson MRN: [id number] Date of Birth: 04-04-1956  Today's Date: 07/31/2016 SLP Individual Time: 1300-1345 SLP Individual Time Calculation (min): 45 min  Short Term Goals: Week 1: SLP Short Term Goal 1 (Week 1): Patient will utilize external aids to reorient herself in 3 consecutive sessions with Mod assist question cues  SLP Short Term Goal 2 (Week 1): Patient will verbally respond to questions with no more than 2 reps in 90% of opportunities.  SLP Short Term Goal 3 (Week 1): Patient will demonstrate initiation and cessation of basic tasks with increased wait time and Mod assist verbal cues to self-monitor errors.  SLP Short Term Goal 4 (Week 1): Patient will select attention to task given mild environmental distractions for 2 minute increments with Mod assist verbal cues for redirection.  SLP Short Term Goal 5 (Week 1): Patient will demonstrate basic problem solving during funcitonal tasks with Mod assist question cues.   SLP Short Term Goal 6 (Week 1): Patient will verbally identify 2 physical and 1 cognitive deficits with Mod question cues.   Skilled Therapeutic Interventions:Skilled treatment session focused on cognition goals. SLP facilitated session by providing Max A multimodal cues to initiate and continuation of simple task (eating lunch). Pt required Max verbal cues to sustain attention to task for ~ 1 minute intervals. With Max A verbal cues to identify 2 physical deficits. Pt left upright in bed with sister present and all needs within reach. Continue per current plan of care.      Function:   Cognition Comprehension Comprehension assist level: Understands basic 50 - 74% of the time/ requires cueing 25 - 49% of the time  Expression   Expression assist level: Expresses basic 75 - 89% of the time/requires cueing 10 - 24% of the time. Needs helper to occlude trach/needs to repeat words.  Social  Interaction Social Interaction assist level: Interacts appropriately 50 - 74% of the time - May be physically or verbally inappropriate.  Problem Solving Problem solving assist level: Solves basic 25 - 49% of the time - needs direction more than half the time to initiate, plan or complete simple activities  Memory Memory assist level: Recognizes or recalls less than 25% of the time/requires cueing greater than 75% of the time    Pain    Therapy/Group: Individual Therapy  Katrina Donaldson 07/31/2016, 2:06 PM

## 2016-07-31 NOTE — Progress Notes (Signed)
Physical Therapy Session Note  Patient Details  Name: Jacee Enerson MRN: [id number] Date of Birth: 03-08-1956  Today's Date: 07/31/2016 PT Individual Time: 1100-1155 PT Individual Time Calculation (min): 55 min   Short Term Goals: Week 1:  PT Short Term Goal 1 (Week 1): pt will perform functional transfers with max A PT Short Term Goal 2 (Week 1): pt will propel w/c with min A 25' PT Short Term Goal 3 (Week 1): pt will maintain sitting balance for functional task with min A  Skilled Therapeutic Interventions/Progress Updates: Pt presented in bed requiring increased encouragement to participate in therapy. Pt required multiple cues and time to respond to PTA re: time/place/situation.  Pt able to appropriately answer time and place, non-responsive to situation despite cues. Pt unable to recall if any previous therapy sessions today however able to recall sitting at EOB. Pt denies pain at time during session and with encouragement agreeable to sitting at EOB. Pt able to follow one step commands to roll to L and maxA supine to sit. Performed LAQ, pillow squeezes, and hip flexion x 10 bilaterally. Pt stating stomach feels like it's "rolling" but denies nausea, nsg aware. Pt tolerated sitting at EOB x 10 min with mod cues for maintaining neutral.  Pt returned to supine via maxA for LE placement and +2 for boosting to [facility]. Pt left in bed with call bell within reach and all current needs met.      Therapy Documentation Precautions:  Precautions Precautions: Fall, Back Precaution Comments: recalled 2/3 back precautions Restrictions Weight Bearing Restrictions: No   See Function Navigator for Current Functional Status.   Therapy/Group: Individual Therapy  Averill Pons  Devontay Celaya, PTA  07/31/2016, 3:55 PM

## 2016-08-01 ENCOUNTER — Inpatient Hospital Stay (HOSPITAL_COMMUNITY): Payer: BC Managed Care – PPO | Admitting: Occupational Therapy

## 2016-08-01 ENCOUNTER — Inpatient Hospital Stay (HOSPITAL_COMMUNITY): Payer: BC Managed Care – PPO | Admitting: Speech Pathology

## 2016-08-01 ENCOUNTER — Inpatient Hospital Stay (HOSPITAL_COMMUNITY): Payer: BC Managed Care – PPO

## 2016-08-01 LAB — BASIC METABOLIC PANEL
ANION GAP: 5 (ref 5–15)
BUN: 9 mg/dL (ref 6–20)
CALCIUM: 7.8 mg/dL — AB (ref 8.9–10.3)
CO2: 25 mmol/L (ref 22–32)
CREATININE: 0.73 mg/dL (ref 0.44–1.00)
Chloride: 108 mmol/L (ref 101–111)
GFR calc non Af Amer: >60 mL/min (ref >60)
Glucose, Bld: 102 mg/dL — ABNORMAL HIGH (ref 65–99)
Potassium: 3.8 mmol/L (ref 3.5–5.1)
SODIUM: 138 mmol/L (ref 135–145)

## 2016-08-01 NOTE — Progress Notes (Signed)
[place] PHYSICAL MEDICINE & REHABILITATION     PROGRESS NOTE  Subjective/Complaints:  Pt laying in bed this AM working with OT.  She slept well overnight. She knows that it is almost groundhog's day.    ROS: Denies CP, SOB, N/V/D.  Objective: Vital Signs: Blood pressure 123/79, pulse 80, temperature 98.6 F (37 C), temperature source Oral, resp. rate 18, height 5\' 5"  (1.651 m), weight 86 kg (189 lb 9.5 oz), SpO2 98 %. No results found. No results for input(s): WBC, HGB, HCT, PLT in the last 72 hours.  Recent Labs  08/01/16 0508  NA 138  K 3.8  CL 108  GLUCOSE 102*  BUN 9  CREATININE 0.73  CALCIUM 7.8*   CBG (last 3)  No results for input(s): GLUCAP in the last 72 hours.  Wt Readings from Last 3 Encounters:  07/24/16 86 kg (189 lb 9.5 oz)  07/19/16 90.8 kg (200 lb 2.8 oz)  07/21/15 85.7 kg (189 lb)    Physical Exam:  BP 123/79 (BP Location: Left Arm)   Pulse 80   Temp 98.6 F (37 C) (Oral)   Resp 18   Ht 5\' 5"  (1.651 m)   Wt 86 kg (189 lb 9.5 oz)   SpO2 98%   BMI 31.55 kg/m  Constitutional: She appears well-developed. Obese  HENT: Normocephalic and atraumatic.  Eyes: EOM are normal. No discharge. Cardiovascular: RRR. No JVD. Respiratory: Effort normal and breath sounds normal.  GI: Soft. Bowel sounds are normal.   Musculoskeletal: She exhibits tenderness. RLE edema. Neurological: She is alert and oriented x3 with cues for date, knows it is almost groundhog's day.  Follows full commands.  Motor: LUE: 4+/5 proximal to distal LLE: 4/5 HF,KE, 4+/5 ADF/PF RUE: 4+/5 proximal to distal RLE: HF, KE 2/5, ADF/PF 4/5 Skin: Skin is warm and dry.  Back dressing intact  Psychiatric: She has a normal mood and affect.   Assessment/Plan: 1. Functional deficits secondary to myelopathy which require 3+ hours per day of interdisciplinary therapy in a comprehensive inpatient rehab setting. Physiatrist is providing close team supervision and 24 hour management of active  medical problems listed below. Physiatrist and rehab team continue to assess barriers to discharge/monitor patient progress toward functional and medical goals.  Function:  Bathing Bathing position   Position: Sitting EOB  Bathing parts Body parts bathed by patient: Right upper leg, Left upper leg, Chest, Abdomen Body parts bathed by helper: Left upper leg, Right lower leg, Back, Buttocks, Front perineal area, Right arm, Left arm  Bathing assist Assist Level: Touching or steadying assistance(Pt > 75%)      Upper Body Dressing/Undressing Upper body dressing   What is the patient wearing?: Pull over shirt/dress     Pull over shirt/dress - Perfomed by patient: Thread/unthread right sleeve, Thread/unthread left sleeve, Put head through opening Pull over shirt/dress - Perfomed by helper: Pull shirt over trunk        Upper body assist Assist Level: Supervision or verbal cues      Lower Body Dressing/Undressing Lower body dressing   What is the patient wearing?: Pants   Underwear - Performed by helper: Thread/unthread right underwear leg, Thread/unthread left underwear leg, Pull underwear up/down Pants- Performed by patient: Thread/unthread right pants leg, Thread/unthread left pants leg (using reacher) Pants- Performed by helper: Thread/unthread right pants leg, Thread/unthread left pants leg, Pull pants up/down   Non-skid slipper socks- Performed by helper: Don/doff right sock, Don/doff left sock  Lower body assist Assist for lower body dressing: 2 Helpers      Toileting Toileting Toileting activity did not occur: N/A   Toileting steps completed by helper: Adjust clothing prior to toileting, Performs perineal hygiene, Adjust clothing after toileting    Toileting assist Assist level: Two helpers   Transfers Chair/bed transfer   Chair/bed transfer method: Squat pivot Chair/bed transfer assist level: Maximal assist (Pt 25 - 49%/lift and  lower) Chair/bed transfer assistive device: Mechanical lift Mechanical lift: Primary school teacher Ambulation activity did not occur: Safety/medical concerns         Wheelchair   Type: Manual Max wheelchair distance: 15' Assist Level: Maximal assistance (Pt 25 - 49%)  Cognition Comprehension Comprehension assist level: Understands basic 50 - 74% of the time/ requires cueing 25 - 49% of the time  Expression Expression assist level: Expresses basic 75 - 89% of the time/requires cueing 10 - 24% of the time. Needs helper to occlude trach/needs to repeat words.  Social Interaction Social Interaction assist level: Interacts appropriately 50 - 74% of the time - May be physically or verbally inappropriate.  Problem Solving Problem solving assist level: Solves basic 25 - 49% of the time - needs direction more than half the time to initiate, plan or complete simple activities  Memory Memory assist level: Recognizes or recalls less than 25% of the time/requires cueing greater than 75% of the time    Medical Problem List and Plan: 1.  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 No brace required.   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  Also recommended repeat CT angio in 6 months, unclear if this was performed.    Per Neurosurg, repeat scan in 3 months.   Will speak with family to gain better understanding of baseline level of cognition 2.  DVT Prophylaxis/Anticoagulation:   Vascular study showing extensive DVT, including right iliac, but IVC appears to be clean  Xarelto started 3. Pain Management: Robaxin and oxycodone as needed. Monitor with increased mobility 4. Mood: Paxil 20 mg daily, Provide emotional  support 5. Neuropsych: This patient is capable of making decisions on her own behalf. 6. Skin/Wound Care: Routine skin checks 7. Fluids/Electrolytes/Nutrition: Routine I&Os  Poor intake, remeron started 1/29 8. Hypertension. Norvasc 10 mg daily, Lopressor 100 mg twice a day.   Controlled 2/1 9. 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 10. History hypercoagulable state. INR 1.09. 11. Constipation. Laxative assistive 12. Hypokalemia: Improved  K+ 3.8 on 2/1 13. Leukocytosis: Improving  WBCs 11.6 on 1/29  Afebrile  Improving 14. ABLA  Hb 10.4 on 1/29   Cont to monitor 15. Thrombocytopenia: Resolved  Plts 380 on 1/29  Cont to monitor   LOS (Days) 8 A FACE TO FACE EVALUATION WAS PERFORMED  Ankit Lorie Phenix 08/01/2016 4:00 PM

## 2016-08-01 NOTE — Patient Care Conference (Signed)
Inpatient RehabilitationTeam Conference and Plan of Care Update Date: 07/31/2016   Time: 2:40 PM    Patient Name: Katrina Donaldson      Medical Record Number: [id number]  Date of Birth: 08-20-1955 Sex: Female         Room/Bed: 4W25C/4W25C-01 Payor Info: Payor: [place] / Plan: [facility] PPO / Product Type: *No Product type* /    Admitting Diagnosis: Spinal Cyst  Admit Date/Time:  07/24/2016  6:34 PM Admission Comments: No comment available   Primary Diagnosis:  <principal problem not specified> Principal Problem: <principal problem not specified>  Patient Active Problem List   Diagnosis Date Noted  . Confusion   . Hydrocephalus   . Poor appetite   . Acute blood loss anemia   . Post-operative pain   . Hypokalemia   . Leukocytosis   . Thrombocytopenia ([place])   . Acute deep vein thrombosis (DVT) of femoral vein of left lower extremity ([place])   . Myelopathy ([place]) 07/24/2016  . Thoracic myelopathy   . Depression   . Benign essential HTN   . History of subarachnoid hemorrhage   . Hypercoagulable state ([place])   . Constipation due to pain medication   . 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 Protein-calorie malnutrition ([place]) 12/21/2014  . Thyroid activity decreased 12/21/2014  . 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 Essential hypertension 12/13/2014  . Anemia, iron deficiency 12/13/2014  . Hypothyroidism 12/13/2014  . Prediabetes 12/13/2014    Expected Discharge Date: Expected Discharge Date: 08/15/16  Team Members Present: Physician leading conference: Dr. Delice Lesch Social Worker Present: Lennart Pall, LCSW Nurse Present: Dorthula Nettles, RN PT Present: Jorge Mandril, PT;Other (comment) (Rosita Dechalus, PTA) OT Present: Benay Pillow, OT SLP Present: Other (comment) Stormy Fabian, SLP) [place] Coordinator present : Daiva Nakayama, RN, CRRN     Current Status/Progress Goal Weekly Team Focus  Medical   Myelopathy  secondary to large thoracolumbar spinal arachnoid cyst status post T11-L1, T4-T7 laminectomy and fenestration of arachnoid cyst 07/19/2016.   Improve cognition, mobility, safety, poor oral intake, leukocytosis  See above   Bowel/Bladder   incontinent of bowel & bladder, LBM 07/30/16  less incontinence episodes with max assist  timed toileting   Swallow/Nutrition/ Hydration             ADL's   max A - total A +2 for functional tasks depending on cognition and level of fatigue  min A overall, supervision/set up for UB dressing  self care retraining, balance, functional transfers, precautions, pt safety   Mobility   MaxA to total dependent bed mobity, transfers, total dependent w/c mobilty   MinA functional mobilty   bed mobility, transfers, sitting tolerance, w/c mobilty    Communication   Mod assist   Sueprvision   increase initiation, self-monitoring and correcting    Safety/Cognition/ Behavioral Observations  Max assist   Min assist   increase orientation, use of external aids and basic problem solving    Pain   patient sometimes cannot tell she's in pain, faces pain scale, has percocet, robaxin, tylenol prn, using atleast once a day  less signs of pain  continue to assess & treat as needed   Skin   honeycomb, staples to back incision, abrasion to left posterior thigh  no new areas of skin breakdown  assess skin q shift    Rehab Goals Patient on target to meet rehab goals: No Rehab Goals Revised: slowed progress over the past couple of  days *See Care Plan and progress notes for long and short-term goals.  Barriers to Discharge: Cogntiion, mobility, extensive DVT, ABLA, DM, poor oral intake, hypokalemia    Possible Resolutions to Barriers:  Therapies, CT ordered- hydrocephalus, Per Neurosurg repeat scan 3 months, DVT treatment, supplement K+    Discharge Planning/Teaching Needs:  Home with family to provide 2/47 assistance vs possible SNF - need to follow up with daughter to  determine plan.  TBD   Team Discussion:  Hydrocephalus on CT - NS says no intervention but to repeat CT in 3 months.  incont of b/b and not aware at all.  Team feels original goals of min assist are likely optimistic.  Pt's ability to participate fluctuates between sessions.  ?behavior ?cognition ?medical cause.  SW still trying to connect with daughter to confirm d/c plan.  Revisions to Treatment Plan:  None   Continued Need for Acute Rehabilitation Level of Care: The patient requires daily medical management by a physician with specialized training in physical medicine and rehabilitation for the following conditions: Daily direction of a multidisciplinary physical rehabilitation program to ensure safe treatment while eliciting the highest outcome that is of practical value to the patient.: Yes Daily medical management of patient stability for increased activity during participation in an intensive rehabilitation regime.: Yes Daily analysis of laboratory values and/or radiology reports with any subsequent need for medication adjustment of medical intervention for : Neurological problems;Diabetes problems;Mood/behavior problems;Urological problems  [name] 08/01/2016, 11:18 AM

## 2016-08-01 NOTE — Progress Notes (Signed)
Physical Therapy Weekly Progress Note  Patient Details  Name: Katrina Donaldson MRN: [id number] Date of Birth: [birth year]  Beginning of progress report period: July 25, 2016 End of progress report period: August 01, 2016  Today's Date: 08/01/2016 PT Individual Time: 1120-1200 PT Individual Time Calculation (min): 40 min  Pt on toilet with nursing staff (used Vertis Kelch)...once pt ready, using Stedy for sit <> stands, total assist for hygiene and donning brief/pants. Pt requires mod vebral cues for weigthshift to get over BOS to maintain upright posture as pt wanting to sink back into the sling and cued throughout to maintain upright posture with 3 attempts due to rest breaks needed in seated position on toilet due to fatigue. Pt able to scoot back in w/c with min assist and verbal cues for sequencing and technique. W/c mobility training with focus on attention and technique with max assist needed due to pt non-use of LUE during this task (demonstrates functional when washing face with washcloth earlier in session). Pt set up on [place] in seated position from w/c to work on reciprocal movement pattern of BLE and strengthening on resistance of 80 cm/sec and cues to maintain attention. PT added basic back for improved upright posture while seated in w/c and increase sitting tolerance. End of session set up in w/c with safety belt donned.   Patient has met 1 of 3 short term goals. Pt is making slow and minimal progress with overall mobility. Pt continues to require max to +2 assist for transfers (either squat/scoot pivot or using lift equipment) due to inconsistencies with ability to participate due to fatigue and impaired cognition. Gait has not been able to be assess yet at this point and standing remains limited. At this time, discharged stair goal due to slow progress and safety risk. Recommending supervision at minimum with w/c level goals set at min assist. Continue to monitor if pt can reach this  pending consistent progress. May benefit from SNF.  Patient continues to demonstrate the following deficits muscle weakness, muscle joint tightness and muscle paralysis, decreased cardiorespiratoy endurance, impaired timing and sequencing, decreased coordination and decreased motor planning, decreased attention to left, decreased initiation, decreased attention, decreased awareness, decreased problem solving, decreased safety awareness, decreased memory and delayed processing and decreased sitting balance, decreased standing balance, decreased postural control, decreased balance strategies and difficulty maintaining precautions and therefore will continue to benefit from skilled PT intervention to increase functional independence with mobility.  Patient progressing toward long term goals..  Plan of care revisions: discharged stair goal..  PT Short Term Goals Week 1:  PT Short Term Goal 1 (Week 1): pt will perform functional transfers with max A PT Short Term Goal 1 - Progress (Week 1): Not met PT Short Term Goal 2 (Week 1): pt will propel w/c with min A 25' PT Short Term Goal 2 - Progress (Week 1): Not met PT Short Term Goal 3 (Week 1): pt will maintain sitting balance for functional task with min A PT Short Term Goal 3 - Progress (Week 1): Met Week 2:  PT Short Term Goal 1 (Week 2): Pt will be able to perform basic transfers with consistent max assist PT Short Term Goal 2 (Week 2): Pt will be able to propel w/c x 25' with min assist PT Short Term Goal 3 (Week 2): Pt will be able to progress sit <> stand with max assist PT Short Term Goal 4 (Week 2): Pt will be able to perform supine <> sit with mod  assist  Skilled Therapeutic Interventions/Progress Updates:  Ambulation/gait training;Cognitive remediation/compensation;Discharge planning;DME/adaptive equipment instruction;Functional mobility training;Pain management;Splinting/orthotics;Therapeutic Activities;UE/LE Strength taining/ROM;Wheelchair  propulsion/positioning;UE/LE Coordination activities;Therapeutic Exercise;Stair training;Patient/family education;Neuromuscular re-education;Functional electrical stimulation;Disease management/prevention;Community reintegration;Balance/vestibular training;Psychosocial support;Visual/perceptual remediation/compensation;Skin care/wound management   Therapy Documentation Precautions:  Precautions Precautions: Fall, Back Precaution Comments: recalled 2/3 back precautions Restrictions Weight Bearing Restrictions: No General: PT Amount of Missed Time (min): 5 Minutes PT Missed Treatment Reason: Toileting Pain: No complaints of pain voiced.   See Function Navigator for Current Functional Status.  Therapy/Group: Individual Therapy  Canary Brim Ivory Broad, PT, DPT  08/01/2016, 12:04 PM

## 2016-08-01 NOTE — Plan of Care (Signed)
Problem: RH Stairs Goal: LTG Patient will ambulate up and down stairs w/assist (PT) LTG: Patient will ambulate up and down # of stairs with assistance (PT)  Outcome: Not Applicable Date Met: 03/21/03 Discharged due to lack of progress at this time for safety ABG

## 2016-08-01 NOTE — Progress Notes (Addendum)
Speech Language Pathology Daily Session Note  Patient Details  Name: Katrina Donaldson MRN: [id number] Date of Birth: [birth year]  Today's Date: 08/01/2016 SLP Individual Time: 1030-1110 SLP Individual Time Calculation (min): 40 min  Short Term Goals: Week 1: SLP Short Term Goal 1 (Week 1): Patient will utilize external aids to reorient herself in 3 consecutive sessions with Mod assist question cues  SLP Short Term Goal 2 (Week 1): Patient will verbally respond to questions with no more than 2 reps in 90% of opportunities.  SLP Short Term Goal 3 (Week 1): Patient will demonstrate initiation and cessation of basic tasks with increased wait time and Mod assist verbal cues to self-monitor errors.  SLP Short Term Goal 4 (Week 1): Patient will select attention to task given mild environmental distractions for 2 minute increments with Mod assist verbal cues for redirection.  SLP Short Term Goal 5 (Week 1): Patient will demonstrate basic problem solving during funcitonal tasks with Mod assist question cues.   SLP Short Term Goal 6 (Week 1): Patient will verbally identify 2 physical and 1 cognitive deficits with Mod question cues.   Skilled Therapeutic Interventions: Skilled treatment session focused on cognitive goals. SLP facilitated session by providing more than a reasonable amount of time and Max A verbal cues for problem solving and sustained attention to task for ~30 second increments. Patient reported difficulty with concentration due to needing to use the commode. Patient transferred to the commode with Max A verbal cues for sequencing. Patient left on commode with RN/NT present. Continue with current plan of care.    Function:  Cognition Comprehension Comprehension assist level: Understands basic 50 - 74% of the time/ requires cueing 25 - 49% of the time  Expression   Expression assist level: Expresses basic 75 - 89% of the time/requires cueing 10 - 24% of the time. Needs helper to occlude  trach/needs to repeat words.  Social Interaction Social Interaction assist level: Interacts appropriately 50 - 74% of the time - May be physically or verbally inappropriate.  Problem Solving Problem solving assist level: Solves basic 25 - 49% of the time - needs direction more than half the time to initiate, plan or complete simple activities  Memory Memory assist level: Recognizes or recalls less than 25% of the time/requires cueing greater than 75% of the time    Pain No/Denies Pain   Therapy/Group: Individual Therapy  Callie Bunyard 08/01/2016, 4:53 PM

## 2016-08-01 NOTE — Progress Notes (Signed)
Occupational Therapy Session Note  Patient Details  Name: Katrina Donaldson MRN: [id number] Date of Birth: [birth year]  Today's Date: 08/01/2016 OT Individual Time:  8:00- 9:00 and 13:30-14:30 60 min and 60 min      Short Term Goals: Week 1:  OT Short Term Goal 1 (Week 1): To increase I with LB dressing, pt will sit to stand with mod A. OT Short Term Goal 2 (Week 1): Pt will use reacher to don pants over feet.  OT Short Term Goal 3 (Week 1): Pt will complete squat pivot transfer to toilet with mod A. OT Short Term Goal 4 (Week 1): Pt will tolerate standing with mod A for at least 1 min during toileting tasks,  Skilled Therapeutic Interventions/Progress Updates:    Session 1: Pt supine in bed upon entering, no pain noted during session and req VC to arouse throughout sessions. Pt unable to recall what she did in sessions yesterday when asked. Pt demo ability to realign trunk in bed while using bedrails after VC to straighten up. Pt attempts to thread pants in figure 4, but unable to reach toes. Pt able to advance pants from ankles to hips after OT threads over feet. Pt rolls MOD A with VC for sequencing and back precautions. Pt MOD-MAX A for squat pivot transfer with VC for sequencing and hand placement. Pt demo improved foot placement during transfer this date. In w/c, nurse distributes meds and pt picks up pea sized pill one at a time to work on fine motor control and coordination. Pt drops 2 pills. Continued encouragement to eat this date and set up food tray for pt to eat. Pt left in room seated in w/c with safety belt secure and call light in reach.   Session 2: Pt seated in w/c upon arrival and no pain noted this session. Pt agreeable to tx with encouragement. Pushed pt w/c to refrigerator and pt reaches for desired flavor of nutrition drink with VC for weight shift and trunk flexion. Pt retrieves cup from R side with cues to scan to identify where needed items are located. Pt fills cup with ice  and hold shoulder in isometric contraction while filling. Pt eats pie and drink at high-low table in day room with encouragement to finish plate and protein drink. Prompted pt to scan L to identify laundry on floor to fold and place on elevated surface to encourage trunk flexion in prep for functional transfers. Pt unable to attend to activity of folding 1 shirt for ~15 min despite verbal and tactile redirection. Pt notably distracted by environmental and internal stimuli. Pt redirected to return clothes folded by OT from table to bag on floor. Pt places folded clothes into dresser with VC to attend to task. Pt requires MIN A to doff shirt for initiation of task and dons shirt with supervision and significantly increased time d/t inattention. Pt squat pivot transfer w/c > bed with MOD A and verbal/tactile cues for hand placement. Pt left supine in bed with call light in reach.  Therapy Documentation Precautions:  Precautions Precautions: Fall, Back Precaution Comments: recalled 2/3 back precautions Restrictions Weight Bearing Restrictions: No General:   Vital Signs: Therapy Vitals Temp: 98.6 F (37 C) Temp Source: Oral Pulse Rate: 80 Resp: 18 BP: 123/79 Patient Position (if appropriate): Lying Oxygen Therapy SpO2: 98 % O2 Device: Not Delivered Pain:   ADL: ADL ADL Comments: refer to functional navigator Exercises:   Other Treatments:    See Function Navigator for Current Functional  Status.   Therapy/Group: Individual Therapy  Tonny Branch 08/01/2016, 9:09 AM

## 2016-08-02 ENCOUNTER — Inpatient Hospital Stay (HOSPITAL_COMMUNITY): Payer: BC Managed Care – PPO | Admitting: Speech Pathology

## 2016-08-02 ENCOUNTER — Inpatient Hospital Stay (HOSPITAL_COMMUNITY): Payer: BC Managed Care – PPO | Admitting: Occupational Therapy

## 2016-08-02 ENCOUNTER — Inpatient Hospital Stay (HOSPITAL_COMMUNITY): Payer: BC Managed Care – PPO

## 2016-08-02 NOTE — Progress Notes (Signed)
Occupational Therapy Weekly Progress Note  Patient Details  Name: Katrina Donaldson MRN: [id number] Date of Birth: 09/17/1955  Beginning of progress report period: July 25, 2016 End of progress report period: August 02, 2016   Patient has met 1 of 4 short term goals.  Pt has made slow progress since time of eval. Pt is able to perform squat pivot transfers to w/c, bed, tub bench/ BSC etc with min to mod A with max VC and more than reasonable amt of time. Pt can perform UB bathing and dressing with extra time with min A to supervision, again with max VC for sequencing and attention to task. Pt currently requires max to total A for LB bathing and dressing at sink level. Pt with low activity tolerance, pain and internal distractions can and are a limiting factor in her progress in ADLs. Pt conitnues to be incontient at time and now requires cathing. Pt can perform sit to stand with max A +2 for safety with RW for clothing management but is not able to come fully erect and maintain position longer than ~10 seconds. Pt d/c plan is now SNF due to family unable to manage her needs. Pt does require max VC for attention, awareness, functional problem solving, etc. LTG have been downgraded to mod A for LB dressing and toileting.   Patient continues to demonstrate the following deficits: muscle weakness and acute pain, decreased cardiorespiratoy endurance, impaired timing and sequencing, unbalanced muscle activation, decreased coordination and decreased motor planning, decreased initiation, decreased attention, decreased awareness, decreased problem solving, decreased safety awareness, decreased memory and delayed processing and decreased standing balance, decreased postural control, decreased balance strategies and difficulty maintaining precautions and therefore will continue to benefit from skilled OT intervention to enhance overall performance with BADL and Reduce care partner burden.  Patient not  progressing toward long term goals.  See goal revision..  Continue plan of care.  OT Short Term Goals Week 1:  OT Short Term Goal 1 (Week 1): To increase I with LB dressing, pt will sit to stand with mod A. OT Short Term Goal 1 - Progress (Week 1): Progressing toward goal OT Short Term Goal 2 (Week 1): Pt will use reacher to don pants over feet.  OT Short Term Goal 2 - Progress (Week 1): Discontinued (comment) OT Short Term Goal 3 (Week 1): Pt will complete squat pivot transfer to toilet with mod A. OT Short Term Goal 3 - Progress (Week 1): Met OT Short Term Goal 4 (Week 1): Pt will tolerate standing with mod A for at least 1 min during toileting tasks, OT Short Term Goal 4 - Progress (Week 1): Not met Week 2:  OT Short Term Goal 1 (Week 2): Pt will sit to stand with MOD A in prep for clothing management. OT Short Term Goal 2 (Week 2): Pt will thread pants over feet with SET UP. OT Short Term Goal 3 (Week 2): Pt will bathe seated to was 10/10 body parts with MIN A and AE PRN.   Skilled Therapeutic Interventions/Progress Updates:    continue with POC  Therapy Documentation Precautions:  Precautions Precautions: Fall, Back Precaution Comments: recalled 2/3 back precautions Restrictions Weight Bearing Restrictions: No Pain: Pain Assessment Pain Assessment: No/denies pain ADL: ADL ADL Comments: refer to functional navigator Exercises:   Other Treatments:    See Function Navigator for Current Functional Status.   Therapy/Group: Individual Therapy  Tonny Branch 08/02/2016, 3:58 PM

## 2016-08-02 NOTE — Progress Notes (Signed)
Physical Therapy Session Note  Patient Details  Name: Katrina Donaldson MRN: [id number] Date of Birth: Nov 03, 1955  Today's Date: 08/02/2016 PT Individual Time: 0800-0900 PT Individual Time Calculation (min): 60 min   Short Term Goals: Week 2:  PT Short Term Goal 1 (Week 2): Pt will be able to perform basic transfers with consistent max assist PT Short Term Goal 2 (Week 2): Pt will be able to propel w/c x 25' with min assist PT Short Term Goal 3 (Week 2): Pt will be able to progress sit <> stand with max assist PT Short Term Goal 4 (Week 2): Pt will be able to perform supine <> sit with mod assist  Skilled Therapeutic Interventions/Progress Updates:   Rolling with mod assist and verbal cues for technique to don and pull up pants to prepare for OOB. Max assist for supine to sit using log roll technique and extra time for sequencing. Pt able to maintain dynamic sitting balance EOB with supervision to start breakfast but then reports discomfort in back in this position and request to have back support. Performed squat pivot transfer with max assist and cues for hand placement from bed -> w/c and pt able to adjust and scoot back in w/c with supervision for cues for technique. While finishing breakfast, engaged in d/c discussion in regards to awareness of deficits, ability to manage at home (ex. 10 stairs for home entry) and reality of current mobility level. Pt demonstrates improved overall awareness this AM into her deficits and able to identify that she would not be able to get into her home and expresses desire to find a one level apartment even before this injury occurred. Neuro re-ed to address sit <> stands and standing tolerance in parallel bars. Required max assist and difficulty achieving and maintaining full upright position. Repeated x 5 reps. End of session set up with rest of breakfast in w/c and safety belt donned.   Therapy Documentation Precautions:  Precautions Precautions: Fall,  Back Precaution Comments: recalled 2/3 back precautions Restrictions Weight Bearing Restrictions: No  Pain: c/o RLE pain due to positioning in bed. Reports rough night last night    See Function Navigator for Current Functional Status.   Therapy/Group: Individual Therapy  Canary Brim Ivory Broad, PT, DPT  08/02/2016, 11:01 AM

## 2016-08-02 NOTE — Plan of Care (Signed)
Problem: RH Balance Goal: LTG: Patient will maintain dynamic sitting balance (OT) LTG:  Patient will maintain dynamic sitting balance with assistance during activities of daily living (OT)  Downgraded due to cognition/ safety  Problem: RH Grooming Goal: LTG Patient will perform grooming w/assist,cues/equip (OT) LTG: Patient will perform grooming with assist, with/without cues using equipment (OT)  Downgraded due to cognition  Problem: RH Dressing Goal: LTG Patient will perform lower body dressing w/assist (OT) LTG: Patient will perform lower body dressing with assist, with/without cues in positioning using equipment (OT)  Downgraded due to progress  Problem: RH Toileting Goal: LTG Patient will perform toileting w/assist, cues/equip (OT) LTG: Patient will perform toiletiing (clothes management/hygiene) with assist, with/without cues using equipment (OT)  Downgraded due to progress and difficulty with prolonged standing  Problem: RH Memory Goal: LTG Patient will demonstrate ability for day to day (OT) LTG:  Patient will demonstrate ability for day to day recall/carryover during activities of daily living with assist  (OT)  Downgraded JLS  Problem: RH Awareness Goal: LTG: Patient will demonstrate intellectual/emergent (OT) LTG: Patient will demonstrate intellectual/emergent/anticipatory awareness with assist during a functional activity  (OT)  Downgraded due to progress and still requiring help with awareness and planning ahead

## 2016-08-02 NOTE — Progress Notes (Signed)
[place] PHYSICAL MEDICINE & REHABILITATION     PROGRESS NOTE  Subjective/Complaints:  Up at EOB . No new complaints. Denies pain.   ROS: pt denies nausea, vomiting, diarrhea, cough, shortness of breath or chest pain  Objective: Vital Signs: Blood pressure 138/77, pulse 72, temperature 98.7 F (37.1 C), temperature source Oral, resp. rate 18, height 5\' 5"  (1.651 m), weight 86 kg (189 lb 9.5 oz), SpO2 99 %. No results found. No results for input(s): WBC, HGB, HCT, PLT in the last 72 hours.  Recent Labs  08/01/16 0508  NA 138  K 3.8  CL 108  GLUCOSE 102*  BUN 9  CREATININE 0.73  CALCIUM 7.8*   CBG (last 3)  No results for input(s): GLUCAP in the last 72 hours.  Wt Readings from Last 3 Encounters:  07/24/16 86 kg (189 lb 9.5 oz)  07/19/16 90.8 kg (200 lb 2.8 oz)  07/21/15 85.7 kg (189 lb)    Physical Exam:  BP 138/77 (BP Location: Right Arm)   Pulse 72   Temp 98.7 F (37.1 C) (Oral)   Resp 18   Ht 5\' 5"  (1.651 m)   Wt 86 kg (189 lb 9.5 oz)   SpO2 99%   BMI 31.55 kg/m  Constitutional: She appears well-developed. Obese  HENT: Normocephalic and atraumatic.  Eyes: EOM are normal. No discharge. Cardiovascular: RRR without JVD. Respiratory: clear bilaterally.  GI: Soft. Bowel sounds are normal.   Musculoskeletal: She exhibits tenderness. RLE edema. Neurological: She is alert and oriented x3 with cues for date, knows it is almost groundhog's day.  Follows full commands.  Motor: LUE: 4+/5 proximal to distal LLE: 4/5 HF,KE, 4+/5 ADF/PF RUE: 4+/5 proximal to distal RLE: HF, KE 2/5, ADF/PF 4/5. Reasonable sitting balance at EOB Skin: Skin is warm and dry.  Back wound dry/intact Psychiatric: She has a normal mood and affect.   Assessment/Plan: 1. Functional deficits secondary to myelopathy which require 3+ hours per day of interdisciplinary therapy in a comprehensive inpatient rehab setting. Physiatrist is providing close team supervision and 24 hour management of  active medical problems listed below. Physiatrist and rehab team continue to assess barriers to discharge/monitor patient progress toward functional and medical goals.  Function:  Bathing Bathing position   Position: Sitting EOB  Bathing parts Body parts bathed by patient: Right upper leg, Left upper leg, Chest, Abdomen Body parts bathed by helper: Left upper leg, Right lower leg, Back, Buttocks, Front perineal area, Right arm, Left arm  Bathing assist Assist Level: Touching or steadying assistance(Pt > 75%)      Upper Body Dressing/Undressing Upper body dressing   What is the patient wearing?: Pull over shirt/dress     Pull over shirt/dress - Perfomed by patient: Thread/unthread right sleeve, Thread/unthread left sleeve, Put head through opening Pull over shirt/dress - Perfomed by helper: Pull shirt over trunk        Upper body assist Assist Level: Supervision or verbal cues      Lower Body Dressing/Undressing Lower body dressing   What is the patient wearing?: Pants   Underwear - Performed by helper: Thread/unthread right underwear leg, Thread/unthread left underwear leg, Pull underwear up/down Pants- Performed by patient: Thread/unthread right pants leg, Thread/unthread left pants leg (using reacher) Pants- Performed by helper: Thread/unthread right pants leg, Thread/unthread left pants leg, Pull pants up/down   Non-skid slipper socks- Performed by helper: Don/doff right sock, Don/doff left sock  Lower body assist Assist for lower body dressing: 2 Helpers      Toileting Toileting Toileting activity did not occur: N/A   Toileting steps completed by helper: Adjust clothing prior to toileting, Performs perineal hygiene, Adjust clothing after toileting    Toileting assist Assist level: Two helpers   Transfers Chair/bed transfer   Chair/bed transfer method: Squat pivot Chair/bed transfer assist level: Maximal assist (Pt 25 - 49%/lift and  lower) Chair/bed transfer assistive device: Armrests Mechanical lift: Primary school teacher Ambulation activity did not occur: Safety/medical concerns         Wheelchair   Type: Manual Max wheelchair distance: 15' Assist Level: Maximal assistance (Pt 25 - 49%)  Cognition Comprehension Comprehension assist level: Understands basic 50 - 74% of the time/ requires cueing 25 - 49% of the time  Expression Expression assist level: Expresses basic 75 - 89% of the time/requires cueing 10 - 24% of the time. Needs helper to occlude trach/needs to repeat words.  Social Interaction Social Interaction assist level: Interacts appropriately 50 - 74% of the time - May be physically or verbally inappropriate.  Problem Solving Problem solving assist level: Solves basic 25 - 49% of the time - needs direction more than half the time to initiate, plan or complete simple activities  Memory Memory assist level: Recognizes or recalls less than 25% of the time/requires cueing greater than 75% of the time    Medical Problem List and Plan: 1.  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 No brace required.   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  Also recommended repeat CT angio in 6 months, unclear if this was performed.    Per Neurosurg, repeat scan in 3 months.   Will speak with family to gain better understanding of baseline level of cognition 2.  DVT Prophylaxis/Anticoagulation:   Vascular study showing extensive DVT, including right iliac, but IVC appears to be clean  Continue Xarelto  3. Pain Management: Robaxin and oxycodone as needed. Monitor with increased mobility 4. Mood: Paxil 20 mg daily, Provide emotional  support 5. Neuropsych: This patient is capable of making decisions on her own behalf. 6. Skin/Wound Care: Routine skin checks 7. Fluids/Electrolytes/Nutrition: Routine I&Os  -remeron started 1/29---still eating little  -appreciate RD assistance 8. Hypertension. Norvasc 10 mg daily, Lopressor 100 mg twice a day.   Controlled 2/1 9. 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 10. History hypercoagulable state. INR 1.09. 11. Constipation. Laxative assistive 12. Hypokalemia: Improved  K+ 3.8 on 2/1 13. Leukocytosis: Improving  WBCs 11.6 on 1/29  Afebrile  Improving 14. ABLA  Hb 10.4 on 1/29   Cont to monitor 15. Thrombocytopenia: Resolved  Plts 380 on 1/29  Cont to monitor   LOS (Days) 9 A FACE TO FACE EVALUATION WAS PERFORMED  SWARTZ,ZACHARY T 08/02/2016 11:01 AM

## 2016-08-02 NOTE — Progress Notes (Signed)
Speech Language Pathology Daily Session Note  Patient Details  Name: Katrina Donaldson MRN: [id number] Date of Birth: 1955-09-03  Today's Date: 08/02/2016 SLP Individual Time: 1105-1130 SLP Individual Time Calculation (min): 25 min  Short Term Goals: Week 1: SLP Short Term Goal 1 (Week 1): Patient will utilize external aids to reorient herself in 3 consecutive sessions with Mod assist question cues  SLP Short Term Goal 2 (Week 1): Patient will verbally respond to questions with no more than 2 reps in 90% of opportunities.  SLP Short Term Goal 3 (Week 1): Patient will demonstrate initiation and cessation of basic tasks with increased wait time and Mod assist verbal cues to self-monitor errors.  SLP Short Term Goal 4 (Week 1): Patient will select attention to task given mild environmental distractions for 2 minute increments with Mod assist verbal cues for redirection.  SLP Short Term Goal 5 (Week 1): Patient will demonstrate basic problem solving during funcitonal tasks with Mod assist question cues.   SLP Short Term Goal 6 (Week 1): Patient will verbally identify 2 physical and 1 cognitive deficits with Mod question cues.   Skilled Therapeutic Interventions: Skilled treatment session focused on cognitive goals. Patient completed basic money management tasks with extra time and Min A verbal cues to self-monitor and correct errors. Patient demonstrated sustained attention to task for 10 minutes with Min A verbal ces for redirection. Patient left upright in wheelchair with family present. Continue with current plan of care.      Function:  Cognition Comprehension Comprehension assist level: Understands basic 50 - 74% of the time/ requires cueing 25 - 49% of the time  Expression   Expression assist level: Expresses basic 75 - 89% of the time/requires cueing 10 - 24% of the time. Needs helper to occlude trach/needs to repeat words.  Social Interaction Social Interaction assist level: Interacts  appropriately 50 - 74% of the time - May be physically or verbally inappropriate.  Problem Solving Problem solving assist level: Solves basic 25 - 49% of the time - needs direction more than half the time to initiate, plan or complete simple activities  Memory Memory assist level: Recognizes or recalls less than 25% of the time/requires cueing greater than 75% of the time    Pain No/Denies Pain   Therapy/Group: Individual Therapy  Franshesca Chipman 08/02/2016, 4:12 PM

## 2016-08-02 NOTE — Progress Notes (Signed)
Occupational Therapy Session Note  Patient Details  Name: Katrina Donaldson MRN: [id number] Date of Birth: Jan 29, 1956  Today's Date: 08/02/2016 OT Individual Time:  9:30- 10:30 and 14:15-15:00   60 min and 45 min   Short Term Goals: Week 1:  OT Short Term Goal 1 (Week 1): To increase I with LB dressing, pt will sit to stand with mod A. OT Short Term Goal 2 (Week 1): Pt will use reacher to don pants over feet.  OT Short Term Goal 3 (Week 1): Pt will complete squat pivot transfer to toilet with mod A. OT Short Term Goal 4 (Week 1): Pt will tolerate standing with mod A for at least 1 min during toileting tasks,  Skilled Therapeutic Interventions/Progress Updates:   Session 1: upon entering room, Pt seated in w/c with breakfast tray. Encouraged pt to continue eating and pt took 3 more bites. Pt unable to recall previous session when asked, and remains easily distracted from external/internal stimuli. Pt reports no pain at beginning of session. Pt doffs shirt with supervision and VC to finish task to completion. At sink, pt washes UB and face with rag while seated in w/c with VC to attend to task. Pt attempts to stand at sink with MAX A +2 to pull down pants, but unable to sequence pushing from armrests and then grabbing onto walker. Pt transfers from w/c<>BSC with MIN A, VC for leg management, and tactile cues for hand placement. Pt demo improved butt clearance this date. Pt sit to stand from [facility] with MAX A and knee blocking while pulling from grab bar as +2 advances pants up/down hips and cleans buttocks. Pt washes hands at sink with VC to attend to task. Pt left in room with all needs met, safety belt secured and call light in place.   Session 2: Upon entering room, pt seated in w/c with food tray. Pt reports pain as pressure in cervical/thoracic spine with activity, but minimal pain at rest. Focus of session on self care retraining, transitional movement and strengthening LE in prep for functional  transfers. In dayroom while seated in w/c, pt assumes seated figure 4 with A from therapist to hold leg in position. Pt doffs/dons bilateral shoes with cues for attention and redirection to tasks. Pt squat pivot transfers w/c<>mat MIN A-CGA and VC to manage feet and hand placement throughout transfer. Pt lies on back and rolls bilaterally on mat with MIN A and VC to log roll to abide by back precautions. While lying on back pt completes 10 bridges with feet secure by OT with ~2" clearance off mat. After log rolling, Pt completes 1x10 rep hip abduction bilaterally to strengthen buttocks in prep for sit to stand. Pt returned to room in w/c with call light in reach, all needs met and safety belt secure.    Therapy Documentation Precautions:  Precautions Precautions: Fall, Back Precaution Comments: recalled 2/3 back precautions Restrictions Weight Bearing Restrictions: No General:   Vital Signs: Therapy Vitals Temp: 98.7 F (37.1 C) Temp Source: Oral Pulse Rate: 72 Resp: 18 BP: 138/77 Patient Position (if appropriate): Lying Oxygen Therapy SpO2: 99 % O2 Device: Not Delivered Pain:   ADL: ADL ADL Comments: refer to functional navigator Exercises:   Other Treatments:    See Function Navigator for Current Functional Status.   Therapy/Group: Individual Therapy  Tonny Branch 08/02/2016, 7:49 AM

## 2016-08-03 ENCOUNTER — Inpatient Hospital Stay (HOSPITAL_COMMUNITY): Payer: BC Managed Care – PPO | Admitting: Occupational Therapy

## 2016-08-03 ENCOUNTER — Inpatient Hospital Stay (HOSPITAL_COMMUNITY): Payer: BC Managed Care – PPO | Admitting: Speech Pathology

## 2016-08-03 NOTE — Progress Notes (Signed)
[place] PHYSICAL MEDICINE & REHABILITATION     PROGRESS NOTE  Subjective/Complaints:   No issues overnite ROS: pt denies nausea, vomiting, diarrhea, cough, shortness of breath or chest pain  Objective: Vital Signs: Blood pressure 120/73, pulse 69, temperature 98.5 F (36.9 C), temperature source Oral, resp. rate 18, height 5\' 5"  (1.651 m), weight 86 kg (189 lb 9.5 oz), SpO2 100 %. No results found. No results for input(s): WBC, HGB, HCT, PLT in the last 72 hours.  Recent Labs  08/01/16 0508  NA 138  K 3.8  CL 108  GLUCOSE 102*  BUN 9  CREATININE 0.73  CALCIUM 7.8*   CBG (last 3)  No results for input(s): GLUCAP in the last 72 hours.  Wt Readings from Last 3 Encounters:  07/24/16 86 kg (189 lb 9.5 oz)  07/19/16 90.8 kg (200 lb 2.8 oz)  07/21/15 85.7 kg (189 lb)    Physical Exam:  BP 120/73 (BP Location: Right Arm)   Pulse 69   Temp 98.5 F (36.9 C) (Oral)   Resp 18   Ht 5\' 5"  (1.651 m)   Wt 86 kg (189 lb 9.5 oz)   SpO2 100%   BMI 31.55 kg/m  Constitutional: She appears well-developed. Obese  HENT: Normocephalic and atraumatic.  Eyes: EOM are normal. No discharge. Cardiovascular: RRR without JVD. Respiratory: clear bilaterally.  GI: Soft. Bowel sounds are normal.   Musculoskeletal: She exhibits tenderness. RLE edema.no pain along joint line or patellar tendon no knee effusion Neurological: She is alert and oriented x3 with cues for date,  Follows full commands.  Motor: LUE: 4+/5 proximal to distal LLE: 4/5 HF,KE, 4+/5 ADF/PF RUE: 4+/5 proximal to distal RLE: HF, KE 2/5, ADF/PF 4/5. Reasonable sitting balance at EOB Skin: Skin is warm and dry.  Back wound dry/intact Psychiatric: She has a normal mood and affect.   Assessment/Plan: 1. Functional deficits secondary to myelopathy which require 3+ hours per day of interdisciplinary therapy in a comprehensive inpatient rehab setting. Physiatrist is providing close team supervision and 24 hour management of  active medical problems listed below. Physiatrist and rehab team continue to assess barriers to discharge/monitor patient progress toward functional and medical goals.  Function:  Bathing Bathing position   Position: Wheelchair/chair at sink  Bathing parts Body parts bathed by patient: Right arm, Left arm, Chest, Abdomen, Right upper leg, Left upper leg Body parts bathed by helper: Buttocks  Bathing assist Assist Level: More than reasonable time, Touching or steadying assistance(Pt > 75%)      Upper Body Dressing/Undressing Upper body dressing   What is the patient wearing?: Pull over shirt/dress     Pull over shirt/dress - Perfomed by patient: Thread/unthread right sleeve, Thread/unthread left sleeve, Put head through opening Pull over shirt/dress - Perfomed by helper: Pull shirt over trunk        Upper body assist Assist Level: Supervision or verbal cues      Lower Body Dressing/Undressing Lower body dressing   What is the patient wearing?: Pants, Socks, Shoes   Underwear - Performed by helper: Thread/unthread right underwear leg, Thread/unthread left underwear leg, Pull underwear up/down Pants- Performed by patient: Thread/unthread right pants leg, Thread/unthread left pants leg (using reacher) Pants- Performed by helper: Thread/unthread right pants leg, Thread/unthread left pants leg, Pull pants up/down   Non-skid slipper socks- Performed by helper: Don/doff right sock, Don/doff left sock Socks - Performed by patient: Don/doff right sock, Don/doff left sock Socks - Performed by helper: Don/doff left sock,  Don/doff right sock Shoes - Performed by patient: Don/doff right shoe, Don/doff left shoe Shoes - Performed by helper: Don/doff right shoe, Don/doff left shoe          Lower body assist Assist for lower body dressing: 2 Helpers      Toileting Toileting Toileting activity did not occur: N/A   Toileting steps completed by helper: Adjust clothing prior to toileting,  Performs perineal hygiene, Adjust clothing after toileting Toileting Assistive Devices: Grab bar or rail  Toileting assist Assist level: Two helpers   Transfers Chair/bed transfer   Chair/bed transfer method: Squat pivot Chair/bed transfer assist level: Maximal assist (Pt 25 - 49%/lift and lower) Chair/bed transfer assistive device: Armrests Mechanical lift: Teacher, music activity did not occur: Safety/medical concerns         Wheelchair   Type: Manual Max wheelchair distance: 15' Assist Level: Maximal assistance (Pt 25 - 49%)  Cognition Comprehension Comprehension assist level: Understands basic 50 - 74% of the time/ requires cueing 25 - 49% of the time  Expression Expression assist level: Expresses basic 75 - 89% of the time/requires cueing 10 - 24% of the time. Needs helper to occlude trach/needs to repeat words.  Social Interaction Social Interaction assist level: Interacts appropriately 50 - 74% of the time - May be physically or verbally inappropriate.  Problem Solving Problem solving assist level: Solves basic 25 - 49% of the time - needs direction more than half the time to initiate, plan or complete simple activities  Memory Memory assist level: Recognizes or recalls less than 25% of the time/requires cueing greater than 75% of the time    Medical Problem List and Plan: 1.  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 No brace required.   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  Per Neurosurg, repeat scan in 3 months.    2.  DVT Prophylaxis/Anticoagulation:   Vascular study showing extensive DVT, including right iliac, but IVC appears to be clean  Continue  Xarelto  3. Pain Management: Robaxin and oxycodone as needed. Right knee pain may be DVT related didn't have PTA 4. Mood: Paxil 20 mg daily, Provide emotional support 5. Neuropsych: This patient is capable of making decisions on her own behalf. 6. Skin/Wound Care: Routine skin checks 7. Fluids/Electrolytes/Nutrition: Routine I&Os  -remeron started 1/29---still eating little  -appreciate RD assistance 8. Hypertension. Norvasc 10 mg daily, Lopressor 100 mg twice a day.   Controlled 2/1 9. 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 10. History hypercoagulable state. INR 1.09. 11. Constipation. Laxative assistive 12. Hypokalemia: Improved  K+ 3.8 on 2/1, will recheck off supplementation on 2/5 13. Leukocytosis: Improving  WBCs 11.6 on 1/29  Afebrile  Improving 14. ABLA  Hb 10.4 on 1/29   Recheck 2/5    LOS (Days) 10 A FACE TO FACE EVALUATION WAS PERFORMED  Charlett Blake 08/03/2016 8:58 AM

## 2016-08-03 NOTE — Progress Notes (Signed)
Speech Language Pathology Daily Session Note  Patient Details  Name: Katrina Donaldson MRN: [id number] Date of Birth: [birth year]  Today's Date: 08/03/2016 SLP Individual Time: 1130-1200 SLP Individual Time Calculation (min): 30 min  Short Term Goals: Week 1: SLP Short Term Goal 1 (Week 1): Patient will utilize external aids to reorient herself in 3 consecutive sessions with Mod assist question cues  SLP Short Term Goal 2 (Week 1): Patient will verbally respond to questions with no more than 2 reps in 90% of opportunities.  SLP Short Term Goal 3 (Week 1): Patient will demonstrate initiation and cessation of basic tasks with increased wait time and Mod assist verbal cues to self-monitor errors.  SLP Short Term Goal 4 (Week 1): Patient will select attention to task given mild environmental distractions for 2 minute increments with Mod assist verbal cues for redirection.  SLP Short Term Goal 5 (Week 1): Patient will demonstrate basic problem solving during funcitonal tasks with Mod assist question cues.   SLP Short Term Goal 6 (Week 1): Patient will verbally identify 2 physical and 1 cognitive deficits with Mod question cues.   Skilled Therapeutic Interventions: Skilled treatment session focused on addressing cognitive-linguistic goals. Patient verbally responded to questions about biographical information and more complex questions about her experiences with extra time and Min verbal cues/repetitions in 90% of opportunities. Patient required Max assist question cues to identify 1 cognitive and 2 physical deficits.  Continue with current plan of care.    Function:  Cognition Comprehension Comprehension assist level: Understands basic 50 - 74% of the time/ requires cueing 25 - 49% of the time  Expression   Expression assist level: Expresses basic 75 - 89% of the time/requires cueing 10 - 24% of the time. Needs helper to occlude trach/needs to repeat words.  Social Interaction Social Interaction  assist level: Interacts appropriately 50 - 74% of the time - May be physically or verbally inappropriate.  Problem Solving Problem solving assist level: Solves basic 25 - 49% of the time - needs direction more than half the time to initiate, plan or complete simple activities  Memory Memory assist level: Recognizes or recalls less than 25% of the time/requires cueing greater than 75% of the time    Pain Pain Assessment Pain Assessment: No/denies pain Pain Score: 0-No pain  Therapy/Group: Individual Therapy  Carmelia Roller., CCC-SLP [id number]  [place] 08/03/2016, 12:20 PM

## 2016-08-03 NOTE — Progress Notes (Signed)
Occupational Therapy Session Note  Patient Details  Name: Katrina Donaldson MRN: [id number] Date of Birth: [birth year]  Today's Date: 08/03/2016 OT Individual Time: [id number] OT Individual Time Calculation (min): 26 min  and Today's Date: 08/03/2016 OT Missed Time: 14 Minutes Missed Time Reason: Patient unwilling/refused to participate without medical reason   Short Term Goals: Week 2:  OT Short Term Goal 1 (Week 2): Pt will sit to stand with MOD A in prep for clothing management. OT Short Term Goal 2 (Week 2): Pt will thread pants over feet with SET UP. OT Short Term Goal 3 (Week 2): Pt will bathe seated to was 10/10 body parts with MIN A and AE PRN.   Skilled Therapeutic Interventions/Progress Updates:  Upon entering the room, pt supine in bed with no c/o pain this session. Pt reports she slept well last night. Pt refused OT intervention. OT providing educating for pt to participate and she closed eyes and turned head away from therapist. Pt handed washcloth to wash face with set up A. Pt is oriented to self, location, situation, and this therapist name. She was unable to state correct month and day of the week. Pt continues to declined OOB tasks or sitting EOB activities. Pt states she is not leaving room today and she is , "not feeling it." Pt remained supine in bed with call bell and all needed items within reach upon exiting the room.      Therapy Documentation Precautions:  Precautions Precautions: Fall, Back Precaution Comments: recalled 2/3 back precautions Restrictions Weight Bearing Restrictions: No General: General OT Amount of Missed Time: 14 Minutes Vital Signs:   Pain:   ADL: ADL ADL Comments: refer to functional navigator Exercises:   Other Treatments:    See Function Navigator for Current Functional Status.   Therapy/Group: Individual Therapy  Gypsy Decant 08/03/2016, 9:43 AM

## 2016-08-04 ENCOUNTER — Inpatient Hospital Stay (HOSPITAL_COMMUNITY): Payer: BC Managed Care – PPO | Admitting: Physical Therapy

## 2016-08-04 DIAGNOSIS — G8220 Paraplegia, unspecified: Secondary | ICD-10-CM

## 2016-08-04 NOTE — Progress Notes (Signed)
Physical Therapy Session Note  Patient Details  Name: Celeste Gong MRN: [id number] Date of Birth: 10-20-1955  Today's Date: 08/04/2016 PT Individual Time: [id number] PT Individual Time Calculation (min): 35 min   Short Term Goals: Week 2:  PT Short Term Goal 1 (Week 2): Pt will be able to perform basic transfers with consistent max assist PT Short Term Goal 2 (Week 2): Pt will be able to propel w/c x 25' with min assist PT Short Term Goal 3 (Week 2): Pt will be able to progress sit <> stand with max assist PT Short Term Goal 4 (Week 2): Pt will be able to perform supine <> sit with mod assist  Skilled Therapeutic Interventions/Progress Updates:  Pt received in bed, noting pain in RLE with movement and reporting need to use bathroom. Pt rolled in bed & found to be incontinent of bowels; pt performed rolling L/R with min assist and max verbal cuing for technique to maintain back precautions. Therapist provided total assist for peri-hygiene and donning new brief. Pt reports she feels she still needs to use bathroom and required mod assist for L sidelying>sitting EOB. Pt able to recall 2/3 back precautions with therapist educating pt on 3/3 and pt requiring max cuing to incorporate precautions into functional mobility. Educated pt on use of Stedy lift & pt completed sit<>stand with mod assist +2  by pulling up on Stedy. Transported pt into bathroom where she required extensive time on toilet. NT in room and notified of pt's location. Pt missed 25 minutes of skilled PT treatment session 2/2 toileting.     Therapy Documentation Precautions:  Precautions Precautions: Fall, Back Precaution Comments: recalled 2/3 back precautions Restrictions Weight Bearing Restrictions: No   General: PT Amount of Missed Time (min): 25 Minutes PT Missed Treatment Reason:  (toileting)   See Function Navigator for Current Functional Status.   Therapy/Group: Individual Therapy  Waunita Schooner 08/04/2016,  2:46 PM

## 2016-08-04 NOTE — Progress Notes (Signed)
[facility] PHYSICAL MEDICINE & REHABILITATION     PROGRESS NOTE  Subjective/Complaints:   Some right knee pain last noc, mild Tolerated therapy doesn't like sara plus lift "hurts my bottom" ROS: pt denies nausea, vomiting, diarrhea, cough, shortness of breath or chest pain  Objective: Vital Signs: Blood pressure (!) 153/76, pulse 63, temperature 97.5 F (36.4 C), temperature source Oral, resp. rate 16, height 5\' 5"  (1.651 m), weight 86 kg (189 lb 9.5 oz), SpO2 100 %. No results found. No results for input(s): WBC, HGB, HCT, PLT in the last 72 hours. No results for input(s): NA, K, CL, GLUCOSE, BUN, CREATININE, CALCIUM in the last 72 hours.  Invalid input(s): CO CBG (last 3)  No results for input(s): GLUCAP in the last 72 hours.  Wt Readings from Last 3 Encounters:  07/24/16 86 kg (189 lb 9.5 oz)  07/19/16 90.8 kg (200 lb 2.8 oz)  07/21/15 85.7 kg (189 lb)    Physical Exam:  BP (!) 153/76 (BP Location: Left Arm)   Pulse 63   Temp 97.5 F (36.4 C) (Oral)   Resp 16   Ht 5\' 5"  (1.651 m)   Wt 86 kg (189 lb 9.5 oz)   SpO2 100%   BMI 31.55 kg/m  Constitutional: She appears well-developed. Obese  HENT: Normocephalic and atraumatic.  Eyes: EOM are normal. No discharge. Cardiovascular: RRR without JVD. Respiratory: clear bilaterally.  GI: Soft. Bowel sounds are normal.   Musculoskeletal: She exhibits tenderness. RLE edema.no pain along joint line or patellar tendon no knee effusion Neurological: She is alert and oriented x3 with cues for date,  Follows full commands.  Motor: LUE: 4+/5 proximal to distal LLE: 4-/5 HF,KE, 4+/5 ADF/PF RUE: 4+/5 proximal to distal RLE: HF, KE 2/5, ADF/PF 4/5. Reasonable sitting balance at EOB Skin: Skin is warm and dry.  Back wound dry/intact Psychiatric: She has a normal mood and affect.   Assessment/Plan: 1. Functional deficits secondary to myelopathy which require 3+ hours per day of interdisciplinary therapy in a comprehensive inpatient  rehab setting. Physiatrist is providing close team supervision and 24 hour management of active medical problems listed below. Physiatrist and rehab team continue to assess barriers to discharge/monitor patient progress toward functional and medical goals.  Function:  Bathing Bathing position   Position: Wheelchair/chair at sink  Bathing parts Body parts bathed by patient: Right arm, Left arm, Chest, Abdomen, Right upper leg, Left upper leg Body parts bathed by helper: Buttocks  Bathing assist Assist Level: More than reasonable time, Touching or steadying assistance(Pt > 75%)      Upper Body Dressing/Undressing Upper body dressing   What is the patient wearing?: Pull over shirt/dress     Pull over shirt/dress - Perfomed by patient: Thread/unthread right sleeve, Thread/unthread left sleeve, Put head through opening Pull over shirt/dress - Perfomed by helper: Pull shirt over trunk        Upper body assist Assist Level: Supervision or verbal cues      Lower Body Dressing/Undressing Lower body dressing   What is the patient wearing?: Pants, Socks, Shoes   Underwear - Performed by helper: Thread/unthread right underwear leg, Thread/unthread left underwear leg, Pull underwear up/down Pants- Performed by patient: Thread/unthread right pants leg, Thread/unthread left pants leg (using reacher) Pants- Performed by helper: Thread/unthread right pants leg, Thread/unthread left pants leg, Pull pants up/down   Non-skid slipper socks- Performed by helper: Don/doff right sock, Don/doff left sock Socks - Performed by patient: Don/doff right sock, Don/doff left sock Socks -  Performed by helper: Don/doff left sock, Don/doff right sock Shoes - Performed by patient: Don/doff right shoe, Don/doff left shoe Shoes - Performed by helper: Don/doff right shoe, Don/doff left shoe          Lower body assist Assist for lower body dressing: 2 Helpers      Toileting Toileting Toileting activity did  not occur: N/A   Toileting steps completed by helper: Adjust clothing prior to toileting, Performs perineal hygiene, Adjust clothing after toileting Toileting Assistive Devices: Grab bar or rail  Toileting assist Assist level: Two helpers   Transfers Chair/bed transfer   Chair/bed transfer method: Squat pivot Chair/bed transfer assist level: Maximal assist (Pt 25 - 49%/lift and lower) Chair/bed transfer assistive device: Armrests Mechanical lift: Teacher, music activity did not occur: Safety/medical concerns         Wheelchair   Type: Manual Max wheelchair distance: 15' Assist Level: Maximal assistance (Pt 25 - 49%)  Cognition Comprehension Comprehension assist level: Understands basic 50 - 74% of the time/ requires cueing 25 - 49% of the time  Expression Expression assist level: Expresses basic 75 - 89% of the time/requires cueing 10 - 24% of the time. Needs helper to occlude trach/needs to repeat words.  Social Interaction Social Interaction assist level: Interacts appropriately 50 - 74% of the time - May be physically or verbally inappropriate.  Problem Solving Problem solving assist level: Solves basic 25 - 49% of the time - needs direction more than half the time to initiate, plan or complete simple activities  Memory Memory assist level: Recognizes or recalls less than 25% of the time/requires cueing greater than 75% of the time    Medical Problem List and Plan: 1.  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 No brace required.   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  Per Neurosurg, repeat scan in 3 months.    2.  DVT  Prophylaxis/Anticoagulation:   Vascular study showing extensive DVT, including right iliac, but IVC appears to be clean  Continue Xarelto , no longer needs IV 3. Pain Management: Robaxin and oxycodone as needed. Right knee pain may be DVT related, mild cont analgesics 4. Mood: Paxil 20 mg daily, Provide emotional support 5. Neuropsych: This patient is capable of making decisions on her own behalf. 6. Skin/Wound Care: Routine skin checks 7. Fluids/Electrolytes/Nutrition: Routine I&Os  -remeron started 1/29---still eating little  -appreciate RD assistance 8. Hypertension. Norvasc 10 mg daily, Lopressor 100 mg twice a day.   Controlled 2/1 9. 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 10. History hypercoagulable state. INR 1.09. 11. Constipation. Laxative assistive 12. Hypokalemia: Improved  K+ 3.8 on 2/1, will recheck off supplementation on 2/5 13. Leukocytosis: Improving  WBCs 11.6 on 1/29  Afebrile  Improving 14. ABLA  Hb 10.4 on 1/29   Recheck 2/5    LOS (Days) 11 A FACE TO FACE EVALUATION WAS PERFORMED  Charlett Blake 08/04/2016 9:26 AM

## 2016-08-05 ENCOUNTER — Inpatient Hospital Stay (HOSPITAL_COMMUNITY): Payer: BC Managed Care – PPO | Admitting: Speech Pathology

## 2016-08-05 ENCOUNTER — Inpatient Hospital Stay (HOSPITAL_COMMUNITY): Payer: BC Managed Care – PPO | Admitting: Physical Therapy

## 2016-08-05 ENCOUNTER — Inpatient Hospital Stay (HOSPITAL_COMMUNITY): Payer: BC Managed Care – PPO | Admitting: *Deleted

## 2016-08-05 ENCOUNTER — Inpatient Hospital Stay (HOSPITAL_COMMUNITY): Payer: BC Managed Care – PPO | Admitting: Occupational Therapy

## 2016-08-05 LAB — BASIC METABOLIC PANEL
Anion gap: 4 — ABNORMAL LOW (ref 5–15)
BUN: 10 mg/dL (ref 6–20)
CO2: 25 mmol/L (ref 22–32)
CREATININE: 0.72 mg/dL (ref 0.44–1.00)
Calcium: 8.3 mg/dL — ABNORMAL LOW (ref 8.9–10.3)
Chloride: 111 mmol/L (ref 101–111)
Glucose, Bld: 95 mg/dL (ref 65–99)
POTASSIUM: 4.2 mmol/L (ref 3.5–5.1)
SODIUM: 140 mmol/L (ref 135–145)

## 2016-08-05 LAB — CBC
HCT: 31.6 % — ABNORMAL LOW (ref 36.0–46.0)
HEMOGLOBIN: 10.1 g/dL — AB (ref 12.0–15.0)
MCH: 26.9 pg (ref 26.0–34.0)
MCHC: 32 g/dL (ref 30.0–36.0)
MCV: 84 fL (ref 78.0–100.0)
PLATELETS: 528 10*3/uL — AB (ref 150–400)
RBC: 3.76 MIL/uL — AB (ref 3.87–5.11)
RDW: 15.5 % (ref 11.5–15.5)
WBC: 6.1 10*3/uL (ref 4.0–10.5)

## 2016-08-05 NOTE — Progress Notes (Signed)
Speech Language Pathology Daily Session Note  Patient Details  Name: Katrina Donaldson MRN: [id number] Date of Birth: [birth year]  Today's Date: 08/05/2016 SLP Individual Time: [id number] SLP Individual Time Calculation (min): 30 min  Short Term Goals: Week 1: SLP Short Term Goal 1 (Week 1): Patient will utilize external aids to reorient herself in 3 consecutive sessions with Mod assist question cues  SLP Short Term Goal 2 (Week 1): Patient will verbally respond to questions with no more than 2 reps in 90% of opportunities.  SLP Short Term Goal 3 (Week 1): Patient will demonstrate initiation and cessation of basic tasks with increased wait time and Mod assist verbal cues to self-monitor errors.  SLP Short Term Goal 4 (Week 1): Patient will select attention to task given mild environmental distractions for 2 minute increments with Mod assist verbal cues for redirection.  SLP Short Term Goal 5 (Week 1): Patient will demonstrate basic problem solving during funcitonal tasks with Mod assist question cues.   SLP Short Term Goal 6 (Week 1): Patient will verbally identify 2 physical and 1 cognitive deficits with Mod question cues.   Skilled Therapeutic Interventions: Skilled treatment session focused on cognitive goals. Upon arrival, patient appeared lethargic. Patient reported she smelled "gas" but was unable to determine if she had been incontinent of bowel. Therefore, SLP facilitated session by standing the patient while utilizing the [facility] with Max A verbal cues needed for sequencing and problem solving with task. Patient had a clean brief and was transferred back to the wheelchair. However, once sitting, patient reported that she did need to use the bathroom. RN made aware. Patient left upright in wheelchair with all needs within reach. Continue with current plan of care.      Function:  Cognition Comprehension Comprehension assist level: Understands basic 50 - 74% of the time/ requires cueing 25 -  49% of the time  Expression   Expression assist level: Expresses basic 75 - 89% of the time/requires cueing 10 - 24% of the time. Needs helper to occlude trach/needs to repeat words.  Social Interaction Social Interaction assist level: Interacts appropriately 50 - 74% of the time - May be physically or verbally inappropriate.  Problem Solving Problem solving assist level: Solves basic 25 - 49% of the time - needs direction more than half the time to initiate, plan or complete simple activities  Memory Memory assist level: Recognizes or recalls less than 25% of the time/requires cueing greater than 75% of the time    Pain No/Denies Pain   Therapy/Group: Individual Therapy  Pasquale Matters 08/05/2016, 3:55 PM

## 2016-08-05 NOTE — Progress Notes (Signed)
Occupational Therapy Session Note  Patient Details  Name: Katrina Donaldson MRN: [id number] Date of Birth: January 28, 1956  Today's Date: 08/05/2016 OT Individual Time: 1001-1059 OT Individual Time Calculation (min): 58 min    Skilled Therapeutic Interventions/Progress Updates: Pt participated in skilled OT session focusing on attention, organization skills, AE use, UB strengthening, and adherence to back precautions. Pt was received via PT handoff, reported that she wanted to redo hair style. Pt unbraided/braided hair and adjusted weave w/c level at sink with mod questioning cues for sequencing and maintaining attention to task. Pt did very well with overall engagement and finishing a task she had initiated herself. Afterwards pt self propelled down hallway, was propelled for remainder of way to dayroom due to time. Once in dayroom reacher training continued with pt navigating around obstacles to retrieve wash cloths off of floor for problem solving, strengthening, and increasing familiarity with AE due to back precautions. Pt 3/3 recall of her precautions today. Afterwards pt ambulated back to room, mod cues for pathfinding with education on visual strategies to locate room during future tx session.      Therapy Documentation Precautions:  Precautions Precautions: Fall, Back Precaution Comments: recalled 2/3 back precautions Restrictions Weight Bearing Restrictions: No General:   Vital Signs: Therapy Vitals Temp: 98.5 F (36.9 C) Temp Source: Oral Pulse Rate: 69 Resp: 17 BP: 92/60 (RN NOTIFIED) Patient Position (if appropriate): Sitting Oxygen Therapy SpO2: 99 % O2 Device: Not Delivered Pain: No c/o pain during session  Pain Assessment Pain Assessment: 0-10 Pain Score: 0-No pain Pain Type: Acute pain Pain Location: Back Pain Orientation: Upper Pain Descriptors / Indicators: Discomfort Pain Frequency: Intermittent Pain Onset: Gradual Patients Stated Pain Goal: 0 Pain  Intervention(s): Medication (See eMAR) ADL: ADL ADL Comments: refer to functional navigator    See Function Navigator for Current Functional Status.   Therapy/Group: Individual Therapy  Fredonia Casalino A Shantee Hayne 08/05/2016, 12:52 PM

## 2016-08-05 NOTE — Progress Notes (Signed)
Physical Therapy Session Note  Patient Details  Name: Katrina Donaldson MRN: [id number] Date of Birth: Jul 17, 1955  Today's Date: 08/05/2016 PT Individual Time: 0835-1000, 1430-1500 PT Individual Time Calculation (min): 85 min and 30 min  Short Term Goals: Week 2:  PT Short Term Goal 1 (Week 2): Pt will be able to perform basic transfers with consistent max assist PT Short Term Goal 2 (Week 2): Pt will be able to propel w/c x 25' with min assist PT Short Term Goal 3 (Week 2): Pt will be able to progress sit <> stand with max assist PT Short Term Goal 4 (Week 2): Pt will be able to perform supine <> sit with mod assist  Skilled Therapeutic Interventions/Progress Updates: Tx1:  Pt presented in bed completing breakfast, agreeable to therapy with encouragement. Pt performed supine to sit with modA with use of log rolling technique to maintain precautions. Pt able to sit at EOB while washing upper body (per pt request). Total assist for donning socks/pants due to pt's increased c/o back pain and time management. Pt able to donn shirt with minA, total unsupported sitting x 62mn. Performed transfer to w/c with use of SClarise Cruz PTA able to pull up pants once pt in standing. Transferred to rehab gym and performed sit to/from stand in parallel bars. Pt required mod/max cues for sequencing, hand placement, and technique. Pt able to perform x 4 sit to/from stands with 10 sec holds. Mod cues for increased glute activation and for erect posture.  Continued w/c mobility training with pt able to propel approx 10 ft then stopping. Pt encouraged increased use of LUE due to frequent drift to L. Pt propelled 131f 15 ft, in straight path requiring maxA for turns. Pt returned to room and hand off to OT for next session.   Tx2: Pt presented in w/c with c/o discomfort and feeling bloated. Nsg notified and provided medication during session.  Pt agreeable to attempt gait activities. Pt able to safely scoot to edge of chair and  perform sit to stand with RW with modA x 1. Pt able to maintain precautions while performing transfer. Pt attempted to initiate one step then c/o increased upper back pain.  Pt returned to seated and stated pain resolved. Pt attempted sit to stand again with same outcome. Continued edu on w/c mobility, pt was able to propel up to [id number] negotiate x 1 turn to L. Pt required moderate cues to stay to task and was able to complete another [id number] returned to room and performed squat pivot transfer with maxA with max cues for sequencing. Log roll sit to supine with HOB elevated maintaining spinal precautions. Pt left in bed with call bell within reach and all current needs met.      Therapy Documentation Precautions:  Precautions Precautions: Fall, Back Precaution Comments: recalled 2/3 back precautions Restrictions Weight Bearing Restrictions: No General:   Vital Signs: Therapy Vitals Temp: 98.5 F (36.9 C) Temp Source: Oral Pulse Rate: 69 Resp: 17 BP: 92/60 (RN NOTIFIED) Patient Position (if appropriate): Sitting Oxygen Therapy SpO2: 99 % O2 Device: Not Delivered Pain: Pain Assessment Pain Assessment: 0-10 Pain Score: 0-No pain Pain Type: Acute pain Pain Location: Back Pain Orientation: Upper Pain Descriptors / Indicators: Discomfort Pain Frequency: Intermittent Pain Onset: Gradual Patients Stated Pain Goal: 0 Pain Intervention(s): Medication (See eMAR)   See Function Navigator for Current Functional Status.   Therapy/Group: Individual Therapy  Gleb Mcguire 08/05/2016, 12:23 PM

## 2016-08-05 NOTE — Progress Notes (Signed)
[facility] PHYSICAL MEDICINE & REHABILITATION     PROGRESS NOTE  Subjective/Complaints:  Pt seen laying in bed this AM.  She states she slept well overnight and had a good weekend.  She intially says she does not know the date, but when prompted she is oriented x3.  ROS: Denies nausea, vomiting, diarrhea, shortness of breath or chest pain  Objective: Vital Signs: Blood pressure 133/83, pulse 67, temperature 98.5 F (36.9 C), temperature source Oral, resp. rate 18, height 5\' 5"  (1.651 m), weight 86 kg (189 lb 9.5 oz), SpO2 100 %. No results found.  Recent Labs  08/05/16 0709  WBC 6.1  HGB 10.1*  HCT 31.6*  PLT 528*    Recent Labs  08/05/16 0709  NA 140  K 4.2  CL 111  GLUCOSE 95  BUN 10  CREATININE 0.72  CALCIUM 8.3*   CBG (last 3)  No results for input(s): GLUCAP in the last 72 hours.  Wt Readings from Last 3 Encounters:  07/24/16 86 kg (189 lb 9.5 oz)  07/19/16 90.8 kg (200 lb 2.8 oz)  07/21/15 85.7 kg (189 lb)    Physical Exam:  BP 133/83 (BP Location: Right Arm)   Pulse 67   Temp 98.5 F (36.9 C) (Oral)   Resp 18   Ht 5\' 5"  (1.651 m)   Wt 86 kg (189 lb 9.5 oz)   SpO2 100%   BMI 31.55 kg/m  Constitutional: She appears well-developed. Obese  HENT: Normocephalic and atraumatic.  Eyes: EOM are normal. No discharge. Cardiovascular: RRR without JVD. Respiratory: clear bilaterally. Unlabored.  GI: Soft. Bowel sounds are normal.   Musculoskeletal: She exhibits no tenderness. RLE edema. Neurological: She is alert and oriented x3 with prompting  Follows full commands.  Motor: LUE: 4+/5 proximal to distal LLE: 4-/5 HF,KE, 4+/5 ADF/PF RUE: 4+/5 proximal to distal RLE: HF, KE 2+/5, ADF/PF 4/5.  Skin: Skin is warm and dry.  Back wound dry/intact Psychiatric: She has a normal mood and affect.   Assessment/Plan: 1. Functional deficits secondary to myelopathy which require 3+ hours per day of interdisciplinary therapy in a comprehensive inpatient rehab  setting. Physiatrist is providing close team supervision and 24 hour management of active medical problems listed below. Physiatrist and rehab team continue to assess barriers to discharge/monitor patient progress toward functional and medical goals.  Function:  Bathing Bathing position   Position: Wheelchair/chair at sink  Bathing parts Body parts bathed by patient: Right arm, Left arm, Chest, Abdomen, Right upper leg, Left upper leg Body parts bathed by helper: Buttocks  Bathing assist Assist Level: More than reasonable time, Touching or steadying assistance(Pt > 75%)      Upper Body Dressing/Undressing Upper body dressing   What is the patient wearing?: Pull over shirt/dress     Pull over shirt/dress - Perfomed by patient: Thread/unthread right sleeve, Thread/unthread left sleeve, Put head through opening Pull over shirt/dress - Perfomed by helper: Pull shirt over trunk        Upper body assist Assist Level: Supervision or verbal cues      Lower Body Dressing/Undressing Lower body dressing   What is the patient wearing?: Pants, Socks, Shoes   Underwear - Performed by helper: Thread/unthread right underwear leg, Thread/unthread left underwear leg, Pull underwear up/down Pants- Performed by patient: Thread/unthread right pants leg, Thread/unthread left pants leg (using reacher) Pants- Performed by helper: Thread/unthread right pants leg, Thread/unthread left pants leg, Pull pants up/down   Non-skid slipper socks- Performed by helper: Don/doff  right sock, Don/doff left sock Socks - Performed by patient: Don/doff right sock, Don/doff left sock Socks - Performed by helper: Don/doff left sock, Don/doff right sock Shoes - Performed by patient: Don/doff right shoe, Don/doff left shoe Shoes - Performed by helper: Don/doff right shoe, Don/doff left shoe          Lower body assist Assist for lower body dressing: 2 Helpers      Toileting Toileting Toileting activity did not  occur: N/A   Toileting steps completed by helper: Adjust clothing prior to toileting, Performs perineal hygiene, Adjust clothing after toileting Toileting Assistive Devices: Grab bar or rail  Toileting assist Assist level: Two helpers   Transfers Chair/bed transfer   Chair/bed transfer method: Squat pivot Chair/bed transfer assist level: Maximal assist (Pt 25 - 49%/lift and lower) Chair/bed transfer assistive device: Armrests Mechanical lift: Teacher, music activity did not occur: Safety/medical concerns         Wheelchair   Type: Manual Max wheelchair distance: 15' Assist Level: Maximal assistance (Pt 25 - 49%)  Cognition Comprehension Comprehension assist level: Understands basic 50 - 74% of the time/ requires cueing 25 - 49% of the time  Expression Expression assist level: Expresses basic 75 - 89% of the time/requires cueing 10 - 24% of the time. Needs helper to occlude trach/needs to repeat words.  Social Interaction Social Interaction assist level: Interacts appropriately 50 - 74% of the time - May be physically or verbally inappropriate.  Problem Solving Problem solving assist level: Solves basic 25 - 49% of the time - needs direction more than half the time to initiate, plan or complete simple activities  Memory Memory assist level: Recognizes or recalls less than 25% of the time/requires cueing greater than 75% of the time    Medical Problem List and Plan: 1.  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 No brace required.   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    Per Neurosurg, repeat scan in 3 months.   Weekend and last week's notes reviewed  No  issues per on-call physician.  2.  DVT Prophylaxis/Anticoagulation:   Vascular study showing extensive DVT, including right iliac, but IVC appears to be clean  Continue Xarelto  3. Pain Management: Robaxin and oxycodone as needed.  4. Mood: Paxil 20 mg daily, Provide emotional support 5. Neuropsych: This patient is capable of making decisions on her own behalf. 6. Skin/Wound Care: Routine skin checks 7. Fluids/Electrolytes/Nutrition: Routine I&Os  -remeron started 1/29---still eating little  -appreciate RD assistance 8. Hypertension. Norvasc 10 mg daily, Lopressor 100 mg twice a day.   Controlled 2/5 9. 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 10. History hypercoagulable state. INR 1.09. 11. Constipation. Laxative assistive 12. Hypokalemia: Improved  K+ 4.2 on 2/5 13. Leukocytosis: Resolved  Afebrile  Improving 14. ABLA  Hb 10.1 on 2/2   Cont to monitor   LOS (Days) 12 A FACE TO FACE EVALUATION WAS PERFORMED  Jessica Checketts Lorie Phenix 08/05/2016 10:06 AM

## 2016-08-06 ENCOUNTER — Inpatient Hospital Stay (HOSPITAL_COMMUNITY): Payer: BC Managed Care – PPO | Admitting: Occupational Therapy

## 2016-08-06 ENCOUNTER — Inpatient Hospital Stay (HOSPITAL_COMMUNITY): Payer: BC Managed Care – PPO | Admitting: Physical Therapy

## 2016-08-06 ENCOUNTER — Inpatient Hospital Stay (HOSPITAL_COMMUNITY): Payer: BC Managed Care – PPO | Admitting: Speech Pathology

## 2016-08-06 NOTE — Progress Notes (Addendum)
Occupational Therapy Session Note  Patient Details  Name: Katrina Donaldson MRN: [id number] Date of Birth: April 21, 1956  Today's Date: 08/06/2016 OT Individual Time: 0705-0800 and [id number] OT Individual Time Calculation (min): 55 min and 38 min    Short Term Goals: Week 2:  OT Short Term Goal 1 (Week 2): Pt will sit to stand with MOD A in prep for clothing management. OT Short Term Goal 2 (Week 2): Pt will thread pants over feet with SET UP. OT Short Term Goal 3 (Week 2): Pt will bathe seated to was 10/10 body parts with MIN A and AE PRN.   Skilled Therapeutic Interventions/Progress Updates:    Session 1: Upon entering the room, pt supine in bed with 6/10 c/o back pain described as stiffness. Pt required coaxing for participation this session. Pt seated on EOB with log roll and assist for trunk and R LE. Pt seated on EOB for 30 minutes with close supervision for sitting balance while eating meal and taking medications. Pt needing a rest break and leaning back against therapist for support for 2 minutes before sitting back upright for 4 additional minutes to finish meal. Pt declined transfer into wheelchair, exercises, and self care tasks at this time. Pt stating, " I just want to get back into bed and sleep." Pt returned to supine with max A for trunk and B LEs with mod verbal cues for technique to protect back. Pt supine in bed with call bell and all needed items within reach upon exiting the room.   Session 2: Upon entering the room, pt supine in bed with no c/o pain. Pt requesting more covers and stating she was cold. Pt refused to get out of bed because she was "cold and just returned to bed". OT provided having discussion with pt in regards to therapy purpose, her goals , and her participation at the moment. Pt verbalizing her goals as, "To do things for myself again." When asked if she felt like she had been putting forth her best effort she stated, "No." OT and pt discussed ways to increase  participation. Pt was active participant in discussion and she admits to feeling "more depressed than before." OT notified SW in order to have neuropsych see pt when available. OT and pt discussed plan for participation starting tomorrow. Pt remained supine in bed with call bell and all needed items within reach.   Therapy Documentation Precautions:  Precautions Precautions: Fall, Back Precaution Comments: recalled 2/3 back precautions Restrictions Weight Bearing Restrictions: No General:   Vital Signs:   Pain: Pain Assessment Pain Assessment: 0-10 Pain Score: 0-No pain Pain Type: Acute pain Pain Location: Back Pain Orientation: Upper Pain Descriptors / Indicators: Aching Pain Frequency: Constant Pain Onset: On-going Patients Stated Pain Goal: 0 Pain Intervention(s): Medication (See eMAR) Multiple Pain Sites: No ADL: ADL ADL Comments: refer to functional navigator Exercises:   Other Treatments:    See Function Navigator for Current Functional Status.   Therapy/Group: Individual Therapy  Gypsy Decant 08/06/2016, 10:11 AM

## 2016-08-06 NOTE — Progress Notes (Signed)
Physical Therapy Session Note  Patient Details  Name: Katrina Donaldson MRN: [id number] Date of Birth: 07-Apr-1956  Today's Date: 08/06/2016 PT Individual Time: 1000-1100 PT Individual Time Calculation (min): 60 min   Short Term Goals: Week 2:  PT Short Term Goal 1 (Week 2): Pt will be able to perform basic transfers with consistent max assist PT Short Term Goal 2 (Week 2): Pt will be able to propel w/c x 25' with min assist PT Short Term Goal 3 (Week 2): Pt will be able to progress sit <> stand with max assist PT Short Term Goal 4 (Week 2): Pt will be able to perform supine <> sit with mod assist  Skilled Therapeutic Interventions/Progress Updates: Pt presented in bed agreeable to therapy. With encouragement pt participated in log rolling supine to sit with cues for LE sequencing and maintaining back precautions. Pt modA donning shirt, total assist for donning pants, shoes for time management. Use of Stedy for sit to/from stand x3 with cues for increased glute activation and erect posture. Pt transferred to w/c and transported to rehab gym.  Use of Kinetron for LE strengthening 70cm/sec 30sec x 5. Pt with mod cues for complete task without distraction. Pt able to propel 12f in w/c with minA however additional time required. Pt encouraged to stay in w/c for lunch Pt remained in w/c with call bell within reach and all current needs met.      Therapy Documentation Precautions:  Precautions Precautions: Fall, Back Precaution Comments: recalled 2/3 back precautions Restrictions Weight Bearing Restrictions: No   See Function Navigator for Current Functional Status.   Therapy/Group: Individual Therapy  Morrissa Shein  Beyounce Dickens, PTA  08/06/2016, 4:12 PM

## 2016-08-06 NOTE — Progress Notes (Signed)
Social Work Patient ID: Katrina Donaldson, female   DOB: 08-22-1955, 61 y.o.   MRN: [id number]   LATE ENTRY:  Met with pt and spoke with daughter (via phone) at the end of last week to review team conference information.  Explainig that goals have been set for min/ mod assist and d/c date targeted for 2/15.  Both report that they do not feel that pt's sister and brother-in-law can provide 24/7 physical assistance and that d/c plan needs to be changed to SNF.  Pt in agreement with this.  I am alerting insurance of change in plan as well.  Bed search begun.  Ciin Brazzel, LCSW

## 2016-08-06 NOTE — Progress Notes (Signed)
Speech Language Pathology Weekly Progress and Session Note  Patient Details  Name: Katrina Donaldson MRN: [id number] Date of Birth: May 08, 1956  Beginning of progress report period: July 30, 2016 End of progress report period: August 06, 2016  Today's Date: 08/06/2016 SLP Individual Time: 1330-1400 SLP Individual Time Calculation (min): 30 min  Short Term Goals: Week 1: SLP Short Term Goal 1 (Week 1): Patient will utilize external aids to reorient herself in 3 consecutive sessions with Mod assist question cues  SLP Short Term Goal 1 - Progress (Week 1): Not met SLP Short Term Goal 2 (Week 1): Patient will verbally respond to questions with no more than 2 reps in 90% of opportunities.  SLP Short Term Goal 2 - Progress (Week 1): Not met SLP Short Term Goal 3 (Week 1): Patient will demonstrate initiation and cessation of basic tasks with increased wait time and Mod assist verbal cues to self-monitor errors.  SLP Short Term Goal 3 - Progress (Week 1): Not met SLP Short Term Goal 4 (Week 1): Patient will demonstrate selective attention in 2 minute increments with Mod assist verbal cues for redirection.  SLP Short Term Goal 4 - Progress (Week 1): Not met SLP Short Term Goal 5 (Week 1): Patient will demonstrate basic problem solving during funcitonal tasks with Mod assist question cues.   SLP Short Term Goal 5 - Progress (Week 1): Not met SLP Short Term Goal 6 (Week 1): Patient will verbally identify 2 physical and 1 cognitive deficits with Mod question cues.  SLP Short Term Goal 6 - Progress (Week 1): Not met    New Short Term Goals: Week 2: SLP Short Term Goal 1 (Week 2): Patient will utilize external aids to reorient herself in 3 consecutive sessions with Mod assist question cues  SLP Short Term Goal 2 (Week 2): Patient will verbally respond to questions with no more than 2 reps in 90% of opportunities.  SLP Short Term Goal 3 (Week 2): Patient will demonstrate initiation and cessation of  basic tasks with increased wait time and Mod assist verbal cues to self-monitor errors.  SLP Short Term Goal 4 (Week 2): Patient will sustained attention to task 5 minute increments with Mod assist verbal cues for redirection.  SLP Short Term Goal 5 (Week 2): Patient will demonstrate basic problem solving during funcitonal tasks with Mod assist question cues.   SLP Short Term Goal 6 (Week 2): Patient will verbally identify 2 physical and 1 cognitive deficits with Mod question cues.   Weekly Progress Updates: Pt has made slow intermittent progress but as a result she has not met any of her STG's. Pt requires Max A verbal support for the above listed goals and continues to required skilled ST to target significant deficits in initiation, awareness, sustained attention and memory to increase functional independence prior to discharge.    Intensity: Minumum of 1-2 x/day, 30 to 90 minutes Frequency: 3 to 5 out of 7 days Duration/Length of Stay: about 20 days  Treatment/Interventions: Cognitive remediation/compensation;Cueing hierarchy;Environmental controls;Functional tasks;Internal/external aids;Medication managment;Patient/family education;Speech/Language facilitation;Therapeutic Activities   Daily Session  Skilled Therapeutic Interventions: Skilled treatment focused on cognition goals. SLP facilitated session by providing Max verbal cues for intellectual awareness of deficits, reviewing current ST plan of care and goals as well as setting new ST goals to address deficits in memory, awareness, attention, and initiation. Pt with poor frustration tolerance when reviewing deficits. Pt was left upright in wheelchair with all needs within reach.  Function:   Eating Eating   Modified Consistency Diet: No Eating Assist Level: Set up assist for   Eating Set Up Assist For: Opening containers       Cognition Comprehension Comprehension assist level: Understands basic 50 - 74% of the time/  requires cueing 25 - 49% of the time  Expression   Expression assist level: Expresses basic 75 - 89% of the time/requires cueing 10 - 24% of the time. Needs helper to occlude trach/needs to repeat words.  Social Interaction Social Interaction assist level: Interacts appropriately 50 - 74% of the time - May be physically or verbally inappropriate.  Problem Solving Problem solving assist level: Solves basic 25 - 49% of the time - needs direction more than half the time to initiate, plan or complete simple activities  Memory Memory assist level: Recognizes or recalls less than 25% of the time/requires cueing greater than 75% of the time   General    Pain    Therapy/Group: Individual Therapy  Magda Muise B. Rutherford Nail, M.S., [place] 08/06/2016, 1:53 PM

## 2016-08-06 NOTE — Progress Notes (Signed)
[facility] PHYSICAL MEDICINE & REHABILITATION     PROGRESS NOTE  Subjective/Complaints:  Pt seen laying in bed this AM.  She states she slept well overnight.  She appears more alert.    ROS: Denies nausea, vomiting, diarrhea, shortness of breath or chest pain  Objective: Vital Signs: Blood pressure 126/78, pulse 63, temperature 98.6 F (37 C), temperature source Oral, resp. rate 18, height 5\' 5"  (1.651 m), weight 86 kg (189 lb 9.5 oz), SpO2 100 %. No results found.  Recent Labs  08/05/16 0709  WBC 6.1  HGB 10.1*  HCT 31.6*  PLT 528*    Recent Labs  08/05/16 0709  NA 140  K 4.2  CL 111  GLUCOSE 95  BUN 10  CREATININE 0.72  CALCIUM 8.3*   CBG (last 3)  No results for input(s): GLUCAP in the last 72 hours.  Wt Readings from Last 3 Encounters:  07/24/16 86 kg (189 lb 9.5 oz)  07/19/16 90.8 kg (200 lb 2.8 oz)  07/21/15 85.7 kg (189 lb)    Physical Exam:  BP 126/78   Pulse 63   Temp 98.6 F (37 C) (Oral)   Resp 18   Ht 5\' 5"  (1.651 m)   Wt 86 kg (189 lb 9.5 oz)   SpO2 100%   BMI 31.55 kg/m  Constitutional: She appears well-developed. Obese  HENT: Normocephalic and atraumatic.  Eyes: EOM are normal. No discharge. Cardiovascular: RRR without JVD. Respiratory: Clear bilaterally. Unlabored.  GI: Soft. Bowel sounds are normal.   Musculoskeletal: She exhibits no tenderness. RLE edema. Neurological: She is alert and oriented x3 with cues for date of month. Follows full commands.  Motor: LUE: 4+/5 proximal to distal LLE: 4-/5 HF,KE, 4+/5 ADF/PF RUE: 4+/5 proximal to distal RLE: HF, KE 3-/5, ADF/PF 4/5 (improved).  Skin: Skin is warm and dry.   Psychiatric: She has a normal mood and affect.   Assessment/Plan: 1. Functional deficits secondary to myelopathy which require 3+ hours per day of interdisciplinary therapy in a comprehensive inpatient rehab setting. Physiatrist is providing close team supervision and 24 hour management of active medical problems listed  below. Physiatrist and rehab team continue to assess barriers to discharge/monitor patient progress toward functional and medical goals.  Function:  Bathing Bathing position   Position: Wheelchair/chair at sink  Bathing parts Body parts bathed by patient: Right arm, Left arm, Chest, Abdomen, Right upper leg, Left upper leg Body parts bathed by helper: Buttocks  Bathing assist Assist Level: More than reasonable time, Touching or steadying assistance(Pt > 75%)      Upper Body Dressing/Undressing Upper body dressing   What is the patient wearing?: Pull over shirt/dress     Pull over shirt/dress - Perfomed by patient: Thread/unthread right sleeve, Thread/unthread left sleeve, Put head through opening Pull over shirt/dress - Perfomed by helper: Pull shirt over trunk        Upper body assist Assist Level: Supervision or verbal cues      Lower Body Dressing/Undressing Lower body dressing   What is the patient wearing?: Pants, Socks, Shoes   Underwear - Performed by helper: Thread/unthread right underwear leg, Thread/unthread left underwear leg, Pull underwear up/down Pants- Performed by patient: Thread/unthread right pants leg, Thread/unthread left pants leg (using reacher) Pants- Performed by helper: Thread/unthread right pants leg, Thread/unthread left pants leg, Pull pants up/down   Non-skid slipper socks- Performed by helper: Don/doff right sock, Don/doff left sock Socks - Performed by patient: Don/doff right sock, Don/doff left sock Socks -  Performed by helper: Don/doff left sock, Don/doff right sock Shoes - Performed by patient: Don/doff right shoe, Don/doff left shoe Shoes - Performed by helper: Don/doff right shoe, Don/doff left shoe          Lower body assist Assist for lower body dressing: 2 Helpers      Toileting Toileting Toileting activity did not occur: N/A   Toileting steps completed by helper: Adjust clothing prior to toileting, Performs perineal hygiene,  Adjust clothing after toileting Toileting Assistive Devices: Grab bar or rail  Toileting assist Assist level: Two helpers   Transfers Chair/bed transfer   Chair/bed transfer method: Squat pivot Chair/bed transfer assist level: Maximal assist (Pt 25 - 49%/lift and lower) Chair/bed transfer assistive device: Armrests Mechanical lift: Stedy   Locomotion Ambulation Ambulation activity did not occur: Safety/medical concerns         Wheelchair   Type: Manual Max wheelchair distance: 15' Assist Level: Maximal assistance (Pt 25 - 49%)  Cognition Comprehension Comprehension assist level: Understands basic 50 - 74% of the time/ requires cueing 25 - 49% of the time  Expression Expression assist level: Expresses basic 75 - 89% of the time/requires cueing 10 - 24% of the time. Needs helper to occlude trach/needs to repeat words.  Social Interaction Social Interaction assist level: Interacts appropriately 50 - 74% of the time - May be physically or verbally inappropriate.  Problem Solving Problem solving assist level: Solves basic 25 - 49% of the time - needs direction more than half the time to initiate, plan or complete simple activities  Memory Memory assist level: Recognizes or recalls less than 25% of the time/requires cueing greater than 75% of the time    Medical Problem List and Plan: 1.  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 No brace required.   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    Per Neurosurg, repeat scan in 3 months.   Weekend and last week's notes reviewed  No issues per on-call physician.  2.  DVT Prophylaxis/Anticoagulation:   Vascular study showing extensive DVT,  including right iliac, but IVC appears to be clean  Continue Xarelto  3. Pain Management: Robaxin and oxycodone as needed.  4. Mood: Paxil 20 mg daily, Provide emotional support 5. Neuropsych: This patient is capable of making decisions on her own behalf. 6. Skin/Wound Care: Routine skin checks 7. Fluids/Electrolytes/Nutrition: Routine I&Os  remeron started 1/29, improving  appreciate RD assistance 8. Hypertension. Norvasc 10 mg daily, Lopressor 100 mg twice a day.   Controlled 2/6 9. 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 10. History hypercoagulable state. INR 1.09. 11. Constipation. Laxative assistive 12. Hypokalemia: Improved  K+ 4.2 on 2/5 13. Leukocytosis: Resolved  Afebrile  Improving 14. ABLA  Hb 10.1 on 2/5   Cont to monitor   LOS (Days) 13 A FACE TO FACE EVALUATION WAS PERFORMED  Ankit Lorie Phenix 08/06/2016 9:22 AM

## 2016-08-07 ENCOUNTER — Inpatient Hospital Stay (HOSPITAL_COMMUNITY): Payer: BC Managed Care – PPO | Admitting: Physical Therapy

## 2016-08-07 ENCOUNTER — Inpatient Hospital Stay (HOSPITAL_COMMUNITY): Payer: BC Managed Care – PPO | Admitting: Speech Pathology

## 2016-08-07 ENCOUNTER — Inpatient Hospital Stay (HOSPITAL_COMMUNITY): Payer: BC Managed Care – PPO | Admitting: Occupational Therapy

## 2016-08-07 MED ORDER — ACETAMINOPHEN 325 MG PO TABS
650.0000 mg | ORAL_TABLET | Freq: Four times a day (QID) | ORAL | Status: DC | PRN
  Administered 2016-08-08 – 2016-08-23 (×10): 650 mg via ORAL
  Filled 2016-08-07 (×10): qty 2

## 2016-08-07 MED ORDER — ACETAMINOPHEN 650 MG RE SUPP: 650.0000 mg | Freq: Four times a day (QID) | RECTAL | Status: DC | PRN

## 2016-08-07 MED ORDER — ENSURE ENLIVE PO LIQD
237.0000 mL | Freq: Two times a day (BID) | ORAL | Status: DC
Start: 2016-08-08 — End: 2016-08-20
  Administered 2016-08-08 – 2016-08-20 (×8): 237 mL via ORAL

## 2016-08-07 NOTE — Progress Notes (Addendum)
[facility] PHYSICAL MEDICINE & REHABILITATION     PROGRESS NOTE  Subjective/Complaints:  Pt seen sitting up in bed, eating breakfast.  She states she slept well overnight.  She denies complaints.   ROS: Denies nausea, vomiting, diarrhea, shortness of breath or chest pain  Objective: Vital Signs: Blood pressure 137/75, pulse 70, temperature 98 F (36.7 C), temperature source Oral, resp. rate 17, height 5\' 5"  (1.651 m), weight 86 kg (189 lb 9.5 oz), SpO2 97 %. No results found.  Recent Labs  08/05/16 0709  WBC 6.1  HGB 10.1*  HCT 31.6*  PLT 528*    Recent Labs  08/05/16 0709  NA 140  K 4.2  CL 111  GLUCOSE 95  BUN 10  CREATININE 0.72  CALCIUM 8.3*   CBG (last 3)  No results for input(s): GLUCAP in the last 72 hours.  Wt Readings from Last 3 Encounters:  07/24/16 86 kg (189 lb 9.5 oz)  07/19/16 90.8 kg (200 lb 2.8 oz)  07/21/15 85.7 kg (189 lb)    Physical Exam:  BP 137/75 (BP Location: Left Arm)   Pulse 70   Temp 98 F (36.7 C) (Oral)   Resp 17   Ht 5\' 5"  (1.651 m)   Wt 86 kg (189 lb 9.5 oz)   SpO2 97%   BMI 31.55 kg/m  Constitutional: She appears well-developed. Obese  HENT: Normocephalic and atraumatic.  Eyes: EOM are normal. No discharge. Cardiovascular: RRR without JVD. Respiratory: Clear bilaterally. Unlabored.  GI: Soft. Bowel sounds are normal.   Musculoskeletal: She exhibits no tenderness. RLE edema. Neurological: She is alert and oriented x3 with cues for date of month. Follows full commands.  Motor: LUE: 4+/5 proximal to distal LLE: 4-/5 HF,KE, 4+/5 ADF/PF RUE: 4+/5 proximal to distal RLE: HF, KE 3-/5, ADF/PF 4/5 (stable).  Skin: Skin is warm and dry.   Psychiatric: She has a normal mood and affect.   Assessment/Plan: 1. Functional deficits secondary to myelopathy which require 3+ hours per day of interdisciplinary therapy in a comprehensive inpatient rehab setting. Physiatrist is providing close team supervision and 24 hour management  of active medical problems listed below. Physiatrist and rehab team continue to assess barriers to discharge/monitor patient progress toward functional and medical goals.  Function:  Bathing Bathing position   Position: Wheelchair/chair at sink  Bathing parts Body parts bathed by patient: Right arm, Left arm, Chest, Abdomen, Right upper leg, Left upper leg Body parts bathed by helper: Buttocks  Bathing assist Assist Level: More than reasonable time, Touching or steadying assistance(Pt > 75%)      Upper Body Dressing/Undressing Upper body dressing   What is the patient wearing?: Pull over shirt/dress     Pull over shirt/dress - Perfomed by patient: Thread/unthread right sleeve, Thread/unthread left sleeve, Put head through opening Pull over shirt/dress - Perfomed by helper: Pull shirt over trunk        Upper body assist Assist Level: Supervision or verbal cues      Lower Body Dressing/Undressing Lower body dressing   What is the patient wearing?: Pants, Socks, Shoes   Underwear - Performed by helper: Thread/unthread right underwear leg, Thread/unthread left underwear leg, Pull underwear up/down Pants- Performed by patient: Thread/unthread right pants leg, Thread/unthread left pants leg (using reacher) Pants- Performed by helper: Thread/unthread right pants leg, Thread/unthread left pants leg, Pull pants up/down   Non-skid slipper socks- Performed by helper: Don/doff right sock, Don/doff left sock Socks - Performed by patient: Don/doff right sock, Don/doff  left sock Socks - Performed by helper: Don/doff left sock, Don/doff right sock Shoes - Performed by patient: Don/doff right shoe, Don/doff left shoe Shoes - Performed by helper: Don/doff right shoe, Don/doff left shoe          Lower body assist Assist for lower body dressing: 2 Helpers      Toileting Toileting Toileting activity did not occur: N/A   Toileting steps completed by helper: Adjust clothing prior to  toileting, Performs perineal hygiene, Adjust clothing after toileting Toileting Assistive Devices: Grab bar or rail  Toileting assist Assist level: Two helpers   Transfers Chair/bed transfer   Chair/bed transfer method: Squat pivot Chair/bed transfer assist level: Maximal assist (Pt 25 - 49%/lift and lower) Chair/bed transfer assistive device: Armrests Mechanical lift: Stedy   Locomotion Ambulation Ambulation activity did not occur: Safety/medical concerns         Wheelchair   Type: Manual Max wheelchair distance: 15' Assist Level: Maximal assistance (Pt 25 - 49%)  Cognition Comprehension Comprehension assist level: Understands basic 50 - 74% of the time/ requires cueing 25 - 49% of the time  Expression Expression assist level: Expresses basic 75 - 89% of the time/requires cueing 10 - 24% of the time. Needs helper to occlude trach/needs to repeat words.  Social Interaction Social Interaction assist level: Interacts appropriately 50 - 74% of the time - May be physically or verbally inappropriate.  Problem Solving Problem solving assist level: Solves basic 25 - 49% of the time - needs direction more than half the time to initiate, plan or complete simple activities  Memory Memory assist level: Recognizes or recalls less than 25% of the time/requires cueing greater than 75% of the time    Medical Problem List and Plan: 1.  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 No brace required.   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    Per Neurosurg, repeat scan in 3 months.  2.  DVT Prophylaxis/Anticoagulation:   Vascular study showing extensive DVT, including right iliac, but IVC appears to  be clean  Continue Xarelto  3. Pain Management: Robaxin and oxycodone as needed.  4. Mood: Paxil 20 mg daily, Provide emotional support 5. Neuropsych: This patient is capable of making decisions on her own behalf. 6. Skin/Wound Care: Routine skin checks 7. Fluids/Electrolytes/Nutrition: Routine I&Os  Remeron started 1/29, improving  appreciate RD assistance 8. Hypertension. Norvasc 10 mg daily, Lopressor 100 mg twice a day.   Controlled 2/7 9. 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 10. History hypercoagulable state. INR 1.09. 11. Constipation. Laxative assistive 12. Hypokalemia: Improved  K+ 4.2 on 2/5 13. Leukocytosis: Resolved  Afebrile  Improving 14. ABLA  Hb 10.1 on 2/5   Cont to monitor   LOS (Days) 14 A FACE TO FACE EVALUATION WAS PERFORMED  Ankit Lorie Phenix 08/07/2016 11:10 AM

## 2016-08-07 NOTE — Progress Notes (Signed)
Occupational Therapy Session Note  Patient Details  Name: Katrina Donaldson MRN: [id number] Date of Birth: 25-Oct-1955  Today's Date: 08/07/2016 OT Individual Time: 1100-1200 OT Individual Time Calculation (min): 60 min    Short Term Goals: Week 2:  OT Short Term Goal 1 (Week 2): Pt will sit to stand with MOD A in prep for clothing management. OT Short Term Goal 2 (Week 2): Pt will thread pants over feet with SET UP. OT Short Term Goal 3 (Week 2): Pt will bathe seated to was 10/10 body parts with MIN A and AE PRN.   Skilled Therapeutic Interventions/Progress Updates:    Upon entering the room, pt seated in wheelchair with c/o 4/0 pain in lower back described as stiffness. Pt agreeable to OT intervention this session. Skilled OT session with focus on back precautions and use of AE for LB dressing. Pt able to verbally report 3/3 back precautions correctly. OT providing functional examples of when precautions are broken and pt able to provide solution for task using AE or modifcations for safety. Pt doffed B socks with use of reacher. OT educating and demonstrating use of sock aide, reacher, and shoe funnel to don B socks and shoes. OT placed elastic laces in shoes in order to increase I with task. Pt unable to don R shoe without assistance this session. Pt returned to room at end of session and remains seated in wheelchair at end of session. Call bell and all needed items within reach upon exiting the room.   Therapy Documentation Precautions:  Precautions Precautions: Fall, Back Precaution Comments: recalled 2/3 back precautions Restrictions Weight Bearing Restrictions: No General:   Vital Signs:   Pain:   ADL: ADL ADL Comments: refer to functional navigator Exercises:   Other Treatments:    See Function Navigator for Current Functional Status.   Therapy/Group: Individual Therapy  Gypsy Decant 08/07/2016, 12:09 PM

## 2016-08-07 NOTE — Progress Notes (Signed)
Nutrition Follow-up  DOCUMENTATION CODES:   Obesity unspecified  INTERVENTION:  Provide Ensure Enlive po BID, each supplement provides 350 kcal and 20 grams of protein.  Discontinue Prostat.  Encourage adequate PO intake.   NUTRITION DIAGNOSIS:   Inadequate oral intake related to  (dislike of food at meals) as evidenced by per patient/family report, meal completion < 50%; improved  GOAL:   Patient will meet greater than or equal to 90% of their needs; met  MONITOR:   PO intake, Supplement acceptance, Labs, Weight trends, Skin, I & O's  REASON FOR ASSESSMENT:   Malnutrition Screening Tool    ASSESSMENT:   61 y.o. right handed female with history of hypertension, hypercoagulable state, [facility] 11/29/2014 requiring surgery in [facility].  Presented 07/19/2016 with gait disorder with decreased balance and some intermittent falls. 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 Underwent T11-L1 laminectomy, T4-T7 laminectomy, fenestration of arachnoid cyst 07/19/2016   Meal completion has been 75-100%. Intake and appetite has improved. Pt currently has Prostat and Ensure ordered and has been consuming them. RD to modify current orders as intake has been adequate.   Diet Order:  Diet regular Room service appropriate? Yes; Fluid consistency: Thin  Skin:  Wound (see comment) (Stg II to coccyx)  Last BM:  2/7  Height:   Ht Readings from Last 1 Encounters:  07/24/16 _0  (1.651 m)    Weight:   Wt Readings from Last 1 Encounters:  07/24/16 189 lb 9.5 oz (86 kg)    Ideal Body Weight:  56.8 kg  BMI:  Body mass index is 31.55 kg/m.  Estimated Nutritional Needs:   Kcal:  1700-1900  Protein:  85-100 grams  Fluid:  1.7 - 1.9 L/day  EDUCATION NEEDS:   No education needs identified at this time  Corrin Parker, MS, RD,  LDN Pager # [phone] After hours/ weekend pager # [phone]

## 2016-08-07 NOTE — Plan of Care (Signed)
Problem: RH SKIN INTEGRITY Goal: RH STG ABLE TO PERFORM INCISION/WOUND CARE W/ASSISTANCE STG Able To Perform Incision/Wound Care With Assistance. min  Outcome: Progressing Pt will be able to perform incision/wound care w/assitance.

## 2016-08-07 NOTE — Progress Notes (Signed)
Physical Therapy Session Note  Patient Details  Name: Katrina Donaldson MRN: [id number] Date of Birth: 26-Jan-1956  Today's Date: 08/07/2016 PT Individual Time: 800-900 and 1510-1535 PT Individual Time Calculation (min): 60 min and 25 min   Short Term Goals: Week 2:  PT Short Term Goal 1 (Week 2): Pt will be able to perform basic transfers with consistent max assist PT Short Term Goal 2 (Week 2): Pt will be able to propel w/c x 25' with min assist PT Short Term Goal 3 (Week 2): Pt will be able to progress sit <> stand with max assist PT Short Term Goal 4 (Week 2): Pt will be able to perform supine <> sit with mod assist  Skilled Therapeutic Interventions/Progress Updates: Tx1:  Pt presented in bed motivated to participate in therapy. Pt performed supine to sit with minA with additional time and no significant increase in pain. Performed lateral scoot transfer to w/c and w/c to mat with modA with cues for hand placement. Performed LAQ and hip flexion x 15 bilaterally with AAROM for complete range on RLE. Performed sit to stand from 23in elevated mat with modA with standing tolerance between 30-45 sec. Pt able to squat pivot transfer back to w/c with minA. Pt returned to room and remained in w/c with call bell within reach and all current needs met.   Tx2: Pt presented in w/c agreeable to therapy. Transported to rehab gym, use of Kinetron for LE stregthening 50cm/min x1 min and 30cm/sec x4 min. With 30-45 breaks between.  Pt able to propel 75f with minA for directional changes. Pt performed squat pivot transfer w/c to bed with minA. Pt performed sit to supine with HOB elevated with modA for LE placement and able to maintain back precautions. Pt [facility] with minA.  Pt left in bed with NT present for I/O cath.      Therapy Documentation Precautions:  Precautions Precautions: Fall, Back Precaution Comments: recalled 2/3 back precautions Restrictions Weight  Bearing Restrictions: No   See Function Navigator for Current Functional Status.   Therapy/Group: Individual Therapy  Kirsti Mcalpine  Eiza Canniff, PTA  08/07/2016, 3:58 PM

## 2016-08-07 NOTE — Plan of Care (Signed)
Problem: SCI BOWEL ELIMINATION Goal: RH STG MANAGE BOWEL WITH ASSISTANCE STG Manage Bowel with Assistance. min  Outcome: Progressing Pt will learn to anticipate her bowel care needs w/modified independence.

## 2016-08-07 NOTE — Progress Notes (Signed)
Speech Language Pathology Daily Session Note  Patient Details  Name: Katrina Donaldson MRN: [id number] Date of Birth: 10-26-1955  Today's Date: 08/07/2016 SLP Individual Time: 1000-1045 SLP Individual Time Calculation (min): 45 min  Short Term Goals: Week 2: SLP Short Term Goal 1 (Week 2): Patient will utilize external aids to reorient herself in 3 consecutive sessions with Mod assist question cues  SLP Short Term Goal 2 (Week 2): Patient will verbally respond to questions with no more than 2 reps in 90% of opportunities.  SLP Short Term Goal 3 (Week 2): Patient will demonstrate initiation and cessation of basic tasks with increased wait time and Mod assist verbal cues to self-monitor errors.  SLP Short Term Goal 4 (Week 2): Patient will sustained attention to task 5 minute increments with Mod assist verbal cues for redirection.  SLP Short Term Goal 5 (Week 2): Patient will demonstrate basic problem solving during funcitonal tasks with Mod assist question cues.   SLP Short Term Goal 6 (Week 2): Patient will verbally identify 2 physical and 1 cognitive deficits with Mod question cues.   Skilled Therapeutic Interventions: Skilled treatment session focused on cognition goals. SLP facilitated session by providing Min A verbal cues to complete basic problem solving task (simple level of Blink). Pt able to initiate tasks with Mod I and responded to questions with Mod I. She independently navigated newspaper for location of orientation information. Pt was returned to room, left upright in room and all needs within reach.      Function:    Cognition Comprehension Comprehension assist level: Understands basic 75 - 89% of the time/ requires cueing 10 - 24% of the time  Expression   Expression assist level: Expresses basic 75 - 89% of the time/requires cueing 10 - 24% of the time. Needs helper to occlude trach/needs to repeat words.  Social Interaction Social Interaction assist level: Interacts  appropriately 50 - 74% of the time - May be physically or verbally inappropriate.  Problem Solving Problem solving assist level: Solves basic 50 - 74% of the time/requires cueing 25 - 49% of the time  Memory Memory assist level: Recognizes or recalls 25 - 49% of the time/requires cueing 50 - 75% of the time    Pain    Therapy/Group: Individual Therapy  [name] B. Rutherford Nail, M.S., CCC-SLP Speech-Language Pathologist  [name] 08/07/2016, 12:07 PM

## 2016-08-07 NOTE — Patient Care Conference (Signed)
Inpatient RehabilitationTeam Conference and Plan of Care Update Date: 08/07/2016   Time: 2:30 PM    Patient Name: Katrina Donaldson      Medical Record Number: [id number]  Date of Birth: 1956/01/12 Sex: Female         Room/Bed: 4W25C/4W25C-01 Payor Info: Payor: [place] / Plan: [facility] PPO / Product Type: *No Product type* /    Admitting Diagnosis: Spinal Cyst  Admit Date/Time:  07/24/2016  6:34 PM Admission Comments: No comment available   Primary Diagnosis:  <principal problem not specified> Principal Problem: <principal problem not specified>  Patient Active Problem List   Diagnosis Date Noted  . Confusion   . Hydrocephalus   . Poor appetite   . Acute blood loss anemia   . Post-operative pain   . Hypokalemia   . Leukocytosis   . Thrombocytopenia ([place])   . Acute deep vein thrombosis (DVT) of femoral vein of left lower extremity ([place])   . Myelopathy ([place]) 07/24/2016  . Thoracic myelopathy   . Depression   . Benign essential HTN   . History of subarachnoid hemorrhage   . Hypercoagulable state ([place])   . Constipation due to pain medication   . 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 Protein-calorie malnutrition ([place]) 12/21/2014  . Thyroid activity decreased 12/21/2014  . 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 Essential hypertension 12/13/2014  . Anemia, iron deficiency 12/13/2014  . Hypothyroidism 12/13/2014  . Prediabetes 12/13/2014    Expected Discharge Date: Expected Discharge Date: 08/21/16  Team Members Present: Physician leading conference: Dr. 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  Improve cognition, mobility, oral intake  See above   Bowel/Bladder   Incontinent of bowel & bladder.Last bm 08/06/16.  Pt unable to void, doing bladder scans w/in&out caths q 8 hours.  Emptied 425 last night.  Regain bladder function and have less incontinence episodes w/bowel.  Timed toileting, monitoring output.   Swallow/Nutrition/ Hydration             ADL's   mod A bed mobility, max A sit <>stand, max A squat pivot functional transfers, UB with min A, LB with max A and use of AE  min A overall, supervision/set up for UB dressing  self care retraining, balance, functional transfers, precautions, pt education   Mobility   mod/maxA bed mobility, maxA sit to/from stand with RW, maxA squat pivot trf, 55ft w/c propulsion  MinA functional mobilty   bed mobilty, transfers, w/c mobilty, standing tolerance   Communication   Mod assist  Supervision  increase initiation, self-monitoring and correcting   Safety/Cognition/ Behavioral Observations  Max assist  Min assist  increase orientation, use of external aids and basic problem solving   Pain             Skin   [place]comb, staples to back incision, abrasion to left posterior thigh beginning to heal.  No new areas of skin breakdown, surgical incision to give evidence of healing, no fever.  Assess skin q shift.      *See Care Plan  and progress notes for long and short-term goals.  Barriers to Discharge: Cogntiion, mobility, DVT, poor oral intake, hydrocephalus    Possible Resolutions to Barriers:  Therapies, DVT treatment, supplement K+    Discharge Planning/Teaching Needs:  Have confirmed with insurance that they will not cover SNF;  Daughter working with family to arrange 24/7 assistance either with daughter in [place] or local family.  TBD   Team Discussion:   Medically improved and  appetite better.  Still with I/O caths;  Can dsh on back be removed? - MD to f/u.  Currently @ mod - max assist with PT/OT.  Beginning to introduce AE for LB management.  MUCH better participation today.  OT had lengthy discussion with pt yesterday.  At current, will keep min assist w/c level goals.  SNF not an option per SW and family to coordinate d/c plan which may be pt moving to [place] with daughter.  Revisions to Treatment Plan:  None   Continued Need for Acute Rehabilitation Level of Care: The patient requires daily medical management by a physician with specialized training in physical medicine and rehabilitation for the following conditions: Daily direction of a multidisciplinary physical rehabilitation program to ensure safe treatment while eliciting the highest outcome that is of practical value to the patient.: Yes Daily medical management of patient stability for increased activity during participation in an intensive rehabilitation regime.: Yes Daily analysis of laboratory values and/or radiology reports with any subsequent need for medication adjustment of medical intervention for : Neurological problems;Diabetes problems;Mood/behavior problems  Katrina Donaldson 08/07/2016, 3:40 PM

## 2016-08-08 ENCOUNTER — Inpatient Hospital Stay (HOSPITAL_COMMUNITY): Payer: BC Managed Care – PPO | Admitting: Occupational Therapy

## 2016-08-08 ENCOUNTER — Inpatient Hospital Stay (HOSPITAL_COMMUNITY): Payer: BC Managed Care – PPO

## 2016-08-08 ENCOUNTER — Inpatient Hospital Stay (HOSPITAL_COMMUNITY): Payer: BC Managed Care – PPO | Admitting: Speech Pathology

## 2016-08-08 NOTE — Progress Notes (Signed)
Social Work Patient ID: Katrina Donaldson, female   DOB: Aug 04, 1955, 61 y.o.   MRN: [id number]   Have reviewed team conference with pt and daughter, Shirlee Limerick (via phone).  Have alerted both that I have been in contact with her insurance CM about pt/fam request to change plan to SNF.  Insurance states that they will not consider covering SNF since pt had come to [facility].  CM states that, if we insisted on pursuing SNF, then she would send information to their medical director and that, "at most we might authorize 7 days to complete family training..."  Daughter understands this and reports that she is going to talk further with ALL family to see if they can coordinate the needed care for pt either locally or at daughter's home in [place].   Have made both aware of targeted d/c date 2/21 and min assist w/c level goals.  Stressing to both that she will need 24/7 physical assistance.  Daughter plans to talk with family over the weekend and to follow up with me a beginning of next week.  Karmine Kauer, LCSW

## 2016-08-08 NOTE — Progress Notes (Signed)
[facility] PHYSICAL MEDICINE & REHABILITATION     PROGRESS NOTE  Subjective/Complaints:  Pt laying in bed this AM.  She slept well overnight.  Encouraged pt to participate in therapies to the best of her abilities, she is in agreement.   ROS: Denies nausea, vomiting, diarrhea, shortness of breath or chest pain  Objective: Vital Signs: Blood pressure 138/86, pulse 67, temperature 98.2 F (36.8 C), temperature source Oral, resp. rate 16, height 5\' 5"  (1.651 m), weight 86 kg (189 lb 9.5 oz), SpO2 99 %. No results found. No results for input(s): WBC, HGB, HCT, PLT in the last 72 hours. No results for input(s): NA, K, CL, GLUCOSE, BUN, CREATININE, CALCIUM in the last 72 hours.  Invalid input(s): CO CBG (last 3)  No results for input(s): GLUCAP in the last 72 hours.  Wt Readings from Last 3 Encounters:  07/24/16 86 kg (189 lb 9.5 oz)  07/19/16 90.8 kg (200 lb 2.8 oz)  07/21/15 85.7 kg (189 lb)    Physical Exam:  BP 138/86 (BP Location: Right Arm)   Pulse 67   Temp 98.2 F (36.8 C) (Oral)   Resp 16   Ht 5\' 5"  (1.651 m)   Wt 86 kg (189 lb 9.5 oz)   SpO2 99%   BMI 31.55 kg/m  Constitutional: She appears well-developed. Obese  HENT: Normocephalic and atraumatic.  Eyes: EOM are normal. No discharge. Cardiovascular: RRR without JVD. Respiratory: Clear bilaterally. Unlabored.  GI: Soft. Bowel sounds are normal.   Musculoskeletal: She exhibits no tenderness. RLE edema. Neurological: She is alert and oriented x3 with cues for date of month. Follows full commands.  Motor: LUE: 4+/5 proximal to distal LLE: 4-/5 HF,KE, 4+/5 ADF/PF RUE: 4+/5 proximal to distal RLE: HF, KE 3-/5, ADF/PF 4/5 (unchanged).  Skin: Skin is warm and dry.   Psychiatric: She has a normal mood and affect.   Assessment/Plan: 1. Functional deficits secondary to myelopathy which require 3+ hours per day of interdisciplinary therapy in a comprehensive inpatient rehab setting. Physiatrist is providing close team  supervision and 24 hour management of active medical problems listed below. Physiatrist and rehab team continue to assess barriers to discharge/monitor patient progress toward functional and medical goals.  Function:  Bathing Bathing position   Position: Wheelchair/chair at sink  Bathing parts Body parts bathed by patient: Right arm, Left arm, Chest, Abdomen, Right upper leg, Left upper leg Body parts bathed by helper: Buttocks  Bathing assist Assist Level: More than reasonable time, Touching or steadying assistance(Pt > 75%)      Upper Body Dressing/Undressing Upper body dressing   What is the patient wearing?: Pull over shirt/dress     Pull over shirt/dress - Perfomed by patient: Thread/unthread right sleeve, Thread/unthread left sleeve, Put head through opening Pull over shirt/dress - Perfomed by helper: Pull shirt over trunk        Upper body assist Assist Level: Supervision or verbal cues      Lower Body Dressing/Undressing Lower body dressing   What is the patient wearing?: Socks, Shoes   Underwear - Performed by helper: Thread/unthread right underwear leg, Thread/unthread left underwear leg, Pull underwear up/down Pants- Performed by patient: Thread/unthread right pants leg, Thread/unthread left pants leg (using reacher) Pants- Performed by helper: Thread/unthread right pants leg, Thread/unthread left pants leg, Pull pants up/down   Non-skid slipper socks- Performed by helper: Don/doff right sock, Don/doff left sock Socks - Performed by patient: Don/doff right sock, Don/doff left sock Socks - Performed by helper: Don/doff  left sock, Don/doff right sock Shoes - Performed by patient: Don/doff left shoe Shoes - Performed by helper: Don/doff right shoe          Lower body assist Assist for lower body dressing: Assistive device (min A) Assistive Device Comment: shoe funnel, reacher, elastic laces, sock aid    Toileting Toileting Toileting activity did not occur: N/A    Toileting steps completed by helper: Adjust clothing prior to toileting, Performs perineal hygiene, Adjust clothing after toileting Toileting Assistive Devices: Grab bar or rail  Toileting assist Assist level: Two helpers   Transfers Chair/bed transfer   Chair/bed transfer method: Squat pivot Chair/bed transfer assist level: Moderate assist (Pt 50 - 74%/lift or lower) Chair/bed transfer assistive device: Armrests Mechanical lift: Stedy   Locomotion Ambulation Ambulation activity did not occur: Safety/medical concerns         Wheelchair   Type: Manual Max wheelchair distance: 15' Assist Level: Maximal assistance (Pt 25 - 49%)  Cognition Comprehension Comprehension assist level: Understands basic 75 - 89% of the time/ requires cueing 10 - 24% of the time  Expression Expression assist level: Expresses basic 75 - 89% of the time/requires cueing 10 - 24% of the time. Needs helper to occlude trach/needs to repeat words.  Social Interaction Social Interaction assist level: Interacts appropriately 50 - 74% of the time - May be physically or verbally inappropriate.  Problem Solving Problem solving assist level: Solves basic 50 - 74% of the time/requires cueing 25 - 49% of the time  Memory Memory assist level: Recognizes or recalls 25 - 49% of the time/requires cueing 50 - 75% of the time    Medical Problem List and Plan: 1.  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 No brace required.   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    Per Neurosurg, repeat scan in 3 months.  2.  DVT Prophylaxis/Anticoagulation:   Vascular study showing extensive DVT, including right iliac, but IVC appears to be  clean  Continue Xarelto  3. Pain Management: Robaxin and oxycodone as needed.  4. Mood: Paxil 20 mg daily, Provide emotional support 5. Neuropsych: This patient is capable of making decisions on her own behalf. 6. Skin/Wound Care: Routine skin checks 7. Fluids/Electrolytes/Nutrition: Routine I&Os  Remeron started 1/29, improving  appreciate RD assistance 8. Hypertension. Norvasc 10 mg daily, Lopressor 100 mg twice a day.   Controlled 2/8 9. 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 10. History hypercoagulable state. INR 1.09. 11. Constipation. Laxative assistive 12. Hypokalemia: Improved  K+ 4.2 on 2/5  Labs ordered for tomorrow 13. Leukocytosis: Resolved  Afebrile  Improving 14. ABLA  Hb 10.1 on 2/5   Labs ordered for tomorrow  Cont to monitor   LOS (Days) 15 A FACE TO FACE EVALUATION WAS PERFORMED  Ankit Lorie Phenix 08/08/2016 9:10 AM

## 2016-08-08 NOTE — Progress Notes (Signed)
Speech Language Pathology Daily Session Note  Patient Details  Name: Katrina Donaldson MRN: [id number] Date of Birth: Feb 04, 1956  Today's Date: 08/08/2016 SLP Individual Time: 0930-1015 SLP Individual Time Calculation (min): 45 min  Short Term Goals: Week 2: SLP Short Term Goal 1 (Week 2): Patient will utilize external aids to reorient herself in 3 consecutive sessions with Mod assist question cues  SLP Short Term Goal 2 (Week 2): Patient will verbally respond to questions with no more than 2 reps in 90% of opportunities.  SLP Short Term Goal 3 (Week 2): Patient will demonstrate initiation and cessation of basic tasks with increased wait time and Mod assist verbal cues to self-monitor errors.  SLP Short Term Goal 4 (Week 2): Patient will sustained attention to task 5 minute increments with Mod assist verbal cues for redirection.  SLP Short Term Goal 5 (Week 2): Patient will demonstrate basic problem solving during funcitonal tasks with Mod assist question cues.   SLP Short Term Goal 6 (Week 2): Patient will verbally identify 2 physical and 1 cognitive deficits with Mod question cues.   Skilled Therapeutic Interventions: Skilled treatment session focused on cognition goals. SLP facilitated session by providing several environment al distractions. PT able to maintain attention to tasks 10 minutes with Min A verbal cues and some increase in self-monitoring evident. Pt demonstrated semi-complex problem solving (creating grocery list from store ad with budgeted amount of money). Pt required Min A verbal cues for completion of task. Pt with decreased initiation times, was Mod I with initiation of tasks and no cessation of basic difficulties. Pt was left upright in wheelchair with all needs within reach. Continue per current plan of care.      Function:    Cognition Comprehension Comprehension assist level: Understands basic 75 - 89% of the time/ requires cueing 10 - 24% of the time  Expression    Expression assist level: Expresses basic 75 - 89% of the time/requires cueing 10 - 24% of the time. Needs helper to occlude trach/needs to repeat words.  Social Interaction Social Interaction assist level: Interacts appropriately 50 - 74% of the time - May be physically or verbally inappropriate.  Problem Solving Problem solving assist level: Solves basic 75 - 89% of the time/requires cueing 10 - 24% of the time  Memory Memory assist level: Recognizes or recalls 50 - 74% of the time/requires cueing 25 - 49% of the time    Pain    Therapy/Group: Individual Therapy   Ellamarie Naeve B. Rutherford Nail, M.S., CCC-SLP Speech-Language Pathologist   Jedrick Hutcherson 08/08/2016, 11:25 AM

## 2016-08-08 NOTE — Progress Notes (Signed)
Occupational Therapy Session Note  Patient Details  Name: Katrina Donaldson MRN: [id number] Date of Birth: [birth year]  Today's Date: 08/08/2016 OT Individual Time:  8:15- 9:30   75 min   Short Term Goals:  week 2  Skilled Therapeutic Interventions/Progress Updates:    Pt seen supine in bed and agreeable to shower this date. Pt supine>sit with A to adjust BLE only. Pt completes STS in stedy with MOD A to lift into standing. For all stands elevated in steady pt requires MIN-CGA with verbal and tactile cues for posture and hip extension. While seated in shower chair Pt bathes 9/10 body parts and uses long handled sponge with A only for buttocks. Pt requires VC to attend to task and increased time to complete shower. While seated, pt attempts to thread BLE into pant leg with reacher, but requires TOTAL A to don pants, VC for functional problem solving and orienting pants, and visual cues for placement of reacher on waistband. Pt demo  understanding of concept of reacher. Pt stands in stedy as OT advances pants over hips. Pt dons shirt seated in w/c and is set up with breakfast tray. Pt left in room with all needs met and call light in reach.   Therapy Documentation Precautions:  Precautions Precautions: Fall, Back Precaution Comments: recalled 2/3 back precautions Restrictions Weight Bearing Restrictions: No General:   Vital Signs:   Pain:   ADL: ADL ADL Comments: refer to functional navigator Exercises:   Other Treatments:    See Function Navigator for Current Functional Status.   Therapy/Group: Individual Therapy  Tonny Branch 08/08/2016, 10:39 AM

## 2016-08-08 NOTE — Progress Notes (Signed)
Staples removed, pt tolerated well 

## 2016-08-08 NOTE — Progress Notes (Addendum)
Physical Therapy Session Note  Patient Details  Name: Katrina Donaldson MRN: [id number] Date of Birth: 19-Jul-1955  Today's Date: 08/08/2016 PT Individual Time: [id number] PT Individual Time Calculation (min): 70 min   Short Term Goals: Week 2:  PT Short Term Goal 1 (Week 2): Pt will be able to perform basic transfers with consistent max assist PT Short Term Goal 2 (Week 2): Pt will be able to propel w/c x 25' with min assist PT Short Term Goal 3 (Week 2): Pt will be able to progress sit <> stand with max assist PT Short Term Goal 4 (Week 2): Pt will be able to perform supine <> sit with mod assist  Skilled Therapeutic Interventions/Progress Updates:    Session focused on addressing sit <> stands, squat pivot transfers, neuro re-ed during gait in [facility], w/c mobility and overall cognition during functional mobility. Seated in w/c while PT adjusted legrests for proper fit and alignment, pt engaged in [place] and marches for functional strengthening. Pt requires cues for sequencing and attention with min assist during functional mobility. Pt required mod assist for scoot pivot transfer with cues for hand placement. Mod assist for sit <> stands with RW with cues and facilitation for anterior weightshift and hip/trunk extension in standing. Pt able to gait x 7' x 2 repetitions (with seated rest break) in Maxi sky with +2 for w/c follow with PT facilitating weightshift and providing assist for postural control. End of session transferred via Stedy back into recliner with pillow positioned for improved back support and posture.   Therapy Documentation Precautions:  Precautions Precautions: Fall, Back Precaution Comments: recalled 2/3 back precautions Restrictions Weight Bearing Restrictions: No   Pain:  Denies pain   See Function Navigator for Current Functional Status.   Therapy/Group: Individual Therapy  Canary Brim Ivory Broad, PT, DPT  08/08/2016, 3:16 PM

## 2016-08-08 NOTE — Progress Notes (Signed)
Pt is still not voiding on her own.  Pt was bladder-scanned three times last night.  After the first scan at approximately 10:45 pm, we did an in-an-out cath and then a post void bladder scan which revealed over 200 mL additional urine which did not drain on catheterization.  This seemed strange, so we repeated only the bladder scan at approximately 0200.  The amount was less than 200 mL at this time.  We did not cath since order was only q 8 hours.  Later at about 0500 we again bladder scanned the pt and found over 200 mL per scan and on catheterizing pt drained close to that amount of urine.  The post cath scan showed 55 mL.  Pt denies any feelings of fullness or need to go to the bathroom.  Rayann Heman

## 2016-08-09 ENCOUNTER — Inpatient Hospital Stay (HOSPITAL_COMMUNITY): Payer: BC Managed Care – PPO | Admitting: Speech Pathology

## 2016-08-09 ENCOUNTER — Inpatient Hospital Stay (HOSPITAL_COMMUNITY): Payer: BC Managed Care – PPO

## 2016-08-09 ENCOUNTER — Inpatient Hospital Stay (HOSPITAL_COMMUNITY): Payer: BC Managed Care – PPO | Admitting: Occupational Therapy

## 2016-08-09 ENCOUNTER — Inpatient Hospital Stay (HOSPITAL_COMMUNITY): Payer: BC Managed Care – PPO | Admitting: Physical Therapy

## 2016-08-09 LAB — CBC WITH DIFFERENTIAL/PLATELET
Basophils Absolute: 0 10*3/uL (ref 0.0–0.1)
Basophils Relative: 0 %
EOS ABS: 0.2 10*3/uL (ref 0.0–0.7)
EOS PCT: 3 %
HCT: 31.2 % — ABNORMAL LOW (ref 36.0–46.0)
Hemoglobin: 9.7 g/dL — ABNORMAL LOW (ref 12.0–15.0)
LYMPHS ABS: 1.7 10*3/uL (ref 0.7–4.0)
Lymphocytes Relative: 33 %
MCH: 26.7 pg (ref 26.0–34.0)
MCHC: 31.1 g/dL (ref 30.0–36.0)
MCV: 86 fL (ref 78.0–100.0)
MONO ABS: 0.6 10*3/uL (ref 0.1–1.0)
Monocytes Relative: 11 %
Neutro Abs: 2.7 10*3/uL (ref 1.7–7.7)
Neutrophils Relative %: 53 %
PLATELETS: 463 10*3/uL — AB (ref 150–400)
RBC: 3.63 MIL/uL — AB (ref 3.87–5.11)
RDW: 16.4 % — ABNORMAL HIGH (ref 11.5–15.5)
WBC: 5.1 10*3/uL (ref 4.0–10.5)

## 2016-08-09 LAB — BASIC METABOLIC PANEL
Anion gap: 8 (ref 5–15)
BUN: 12 mg/dL (ref 6–20)
CHLORIDE: 106 mmol/L (ref 101–111)
CO2: 26 mmol/L (ref 22–32)
CREATININE: 0.89 mg/dL (ref 0.44–1.00)
Calcium: 8.4 mg/dL — ABNORMAL LOW (ref 8.9–10.3)
GFR calc Af Amer: >60 mL/min (ref >60)
GFR calc non Af Amer: >60 mL/min (ref >60)
GLUCOSE: 92 mg/dL (ref 65–99)
Potassium: 4.4 mmol/L (ref 3.5–5.1)
SODIUM: 140 mmol/L (ref 135–145)

## 2016-08-09 MED ORDER — FERROUS SULFATE 325 (65 FE) MG PO TABS
325.0000 mg | ORAL_TABLET | Freq: Every day | ORAL | Status: DC
  Administered 2016-08-09 – 2016-08-24 (×16): 325 mg via ORAL
  Filled 2016-08-09 (×16): qty 1

## 2016-08-09 MED ORDER — VITAMIN C 500 MG PO TABS
250.0000 mg | ORAL_TABLET | Freq: Every day | ORAL | Status: DC
  Administered 2016-08-09 – 2016-08-24 (×16): 250 mg via ORAL
  Filled 2016-08-09 (×16): qty 1

## 2016-08-09 NOTE — Progress Notes (Signed)
Pt had a bladder scan at 0200, charted in flowsheets.  Pt's scans q 8 hours over the past 3 days have averaged about 150 - 300 mL each time.  Pt's brief has stayed dry, pt states she has no sensation of needing to void.   Pt's thoracic/lumbar incision is open to air, steri strips are still intact.  Pt denies pain or needs. No redness or swelling.    BP 129/73 (BP Location: Right Arm)   Pulse 77   Temp 98.2 F (36.8 C) (Oral)   Resp 17   Ht 5\' 5"  (1.651 m)   Wt 86 kg (189 lb 9.5 oz)   SpO2 98%   BMI 31.55 kg/m   Rayann Heman

## 2016-08-09 NOTE — Progress Notes (Signed)
Occupational Therapy Session Note  Patient Details  Name: Katrina Donaldson MRN: [id number] Date of Birth: [birth year]  Today's Date: 08/09/2016 OT Individual Time:  9:00- 10:00   60 min   Short Term Goals: Week 2:  OT Short Term Goal 1 (Week 2): Pt will sit to stand with MOD A in prep for clothing management. OT Short Term Goal 2 (Week 2): Pt will thread pants over feet with SET UP. OT Short Term Goal 3 (Week 2): Pt will bathe seated to was 10/10 body parts with MIN A and AE PRN.   Skilled Therapeutic Interventions/Progress Updates:    Pt supine in bed upon arrival. Pt reports no pain and agreeable to tx. Focus of session on self care retraining.  Pt refuse to take shower but prefer to wash upper body at sink. Pt requires MIN A for supine>EOB to A R leg over edge and VC to maintain back precautions throughout transition. With CGA, pt squat pivot transfer EOB>w/c with VC for trunk flexion. Pt gathers clothes from dresser at w/c level and washes upper body and face at sink. Pt dons shirt with supervision with VC to attend to task as pt is distracted by external and internal stimuli. Pt threads BLE in seated figure 4 with A from OT to lift leg into position. With sock aide and shoe funnel, pt able to don B socks and shoes. Pt require VC for functional problem solving to don shoes. Pt demo difficulty managing reacher and shoe funnel to don shoes and require A to assume/maintain figure 4 to place shoes over toes.Tightness noted in B hipexternal rotation/abduction. Pt STS x2  in stedy with MIN A for lifting, tactile cues for hip extension and VC for posture.  In standing, OT advances pants over hip. After seated rest break Pt STS off stedy to brush teeth. Pt returns to seated in w/c with all needs met and call light in place.   Therapy Documentation Precautions:  Precautions Precautions: Fall, Back Precaution Comments: recalled 2/3 back precautions Restrictions Weight Bearing Restrictions:  No General:   Vital Signs: Therapy Vitals Temp: 98.2 F (36.8 C) Temp Source: Oral Pulse Rate: 77 Resp: 17 BP: 129/73 Patient Position (if appropriate): Lying Oxygen Therapy SpO2: 98 % O2 Device: Not Delivered Pain:   ADL: ADL ADL Comments: refer to functional navigator Exercises:   Other Treatments:    See Function Navigator for Current Functional Status.   Therapy/Group: Individual Therapy  Tonny Branch 08/09/2016, 7:46 AM

## 2016-08-09 NOTE — Plan of Care (Signed)
Problem: RH Ambulation Goal: LTG Patient will ambulate in controlled environment (PT) LTG: Patient will ambulate in a controlled environment, # of feet with assistance (PT).  Downgraded due to slow progress. ABG

## 2016-08-09 NOTE — Progress Notes (Signed)
[facility] PHYSICAL MEDICINE & REHABILITATION     PROGRESS NOTE  Subjective/Complaints:  Pt seen laying in bed this AM.  She slept well overnight.  She states she is trying harder in therapies.   ROS: Denies nausea, vomiting, diarrhea, shortness of breath or chest pain  Objective: Vital Signs: Blood pressure 129/73, pulse 77, temperature 98.2 F (36.8 C), temperature source Oral, resp. rate 17, height 5\' 5"  (1.651 m), weight 86 kg (189 lb 9.5 oz), SpO2 98 %. No results found.  Recent Labs  08/09/16 0553  WBC 5.1  HGB 9.7*  HCT 31.2*  PLT 463*    Recent Labs  08/09/16 0553  NA 140  K 4.4  CL 106  GLUCOSE 92  BUN 12  CREATININE 0.89  CALCIUM 8.4*   CBG (last 3)  No results for input(s): GLUCAP in the last 72 hours.  Wt Readings from Last 3 Encounters:  07/24/16 86 kg (189 lb 9.5 oz)  07/19/16 90.8 kg (200 lb 2.8 oz)  07/21/15 85.7 kg (189 lb)    Physical Exam:  BP 129/73 (BP Location: Right Arm)   Pulse 77   Temp 98.2 F (36.8 C) (Oral)   Resp 17   Ht 5\' 5"  (1.651 m)   Wt 86 kg (189 lb 9.5 oz)   SpO2 98%   BMI 31.55 kg/m  Constitutional: She appears well-developed. Obese  HENT: Normocephalic and atraumatic.  Eyes: EOM are normal. No discharge. Cardiovascular: RRR. No JVD. Respiratory: Clear bilaterally. Unlabored.  GI: Soft. Bowel sounds are normal.   Musculoskeletal: She exhibits no tenderness. RLE edema. Neurological: She is alert and oriented x3 with cues for date of month. Follows full commands.  Motor: LUE: 4+/5 proximal to distal LLE: 4-/5 HF,KE, 4+/5 ADF/PF RUE: 4+/5 proximal to distal RLE: HF, KE 3-/5, ADF/PF 4/5 (stable).  Skin: Skin is warm and dry.  Back incision c/d/i. Psychiatric: She has a normal mood and affect.   Assessment/Plan: 1. Functional deficits secondary to myelopathy which require 3+ hours per day of interdisciplinary therapy in a comprehensive inpatient rehab setting. Physiatrist is providing close team supervision and 24  hour management of active medical problems listed below. Physiatrist and rehab team continue to assess barriers to discharge/monitor patient progress toward functional and medical goals.  Function:  Bathing Bathing position   Position: Shower  Bathing parts Body parts bathed by patient: Right arm, Left arm, Chest, Abdomen, Front perineal area, Right upper leg, Left upper leg, Right lower leg, Left lower leg, Back Body parts bathed by helper: Buttocks  Bathing assist Assist Level: Touching or steadying assistance(Pt > 75%)      Upper Body Dressing/Undressing Upper body dressing   What is the patient wearing?: Pull over shirt/dress     Pull over shirt/dress - Perfomed by patient: Thread/unthread right sleeve, Thread/unthread left sleeve, Put head through opening Pull over shirt/dress - Perfomed by helper: Pull shirt over trunk        Upper body assist Assist Level: Supervision or verbal cues      Lower Body Dressing/Undressing Lower body dressing   What is the patient wearing?: Pants   Underwear - Performed by helper: Thread/unthread right underwear leg, Thread/unthread left underwear leg, Pull underwear up/down Pants- Performed by patient: Thread/unthread right pants leg, Thread/unthread left pants leg (using reacher) Pants- Performed by helper: Thread/unthread right pants leg, Thread/unthread left pants leg, Pull pants up/down   Non-skid slipper socks- Performed by helper: Don/doff right sock, Don/doff left sock Socks - Performed  by patient: Don/doff right sock, Don/doff left sock Socks - Performed by helper: Don/doff left sock, Don/doff right sock Shoes - Performed by patient: Don/doff left shoe Shoes - Performed by helper: Don/doff right shoe          Lower body assist Assist for lower body dressing: Assistive device (TOTAL A; reacher) Assistive Device Comment: shoe funnel, reacher, elastic laces, sock aid    Toileting Toileting Toileting activity did not occur:  (pt  not voiding, doing scans & caths q 8 hours)   Toileting steps completed by helper: Adjust clothing prior to toileting, Performs perineal hygiene, Adjust clothing after toileting Toileting Assistive Devices: Grab bar or rail  Toileting assist Assist level: Two helpers   Transfers Chair/bed transfer   Chair/bed transfer method: Squat pivot Chair/bed transfer assist level: Moderate assist (Pt 50 - 74%/lift or lower) Chair/bed transfer assistive device: Armrests Mechanical lift: Stedy   Locomotion Ambulation Ambulation activity did not occur: Safety/medical concerns   Max distance: 7' (x2 ) Assist level: 2 helpers   Wheelchair   Type: Manual Max wheelchair distance: 50' Assist Level: Touching or steadying assistance (Pt > 75%)  Cognition Comprehension Comprehension assist level: Understands basic 75 - 89% of the time/ requires cueing 10 - 24% of the time  Expression Expression assist level: Expresses basic 75 - 89% of the time/requires cueing 10 - 24% of the time. Needs helper to occlude trach/needs to repeat words.  Social Interaction Social Interaction assist level: Interacts appropriately 90% of the time - Needs monitoring or encouragement for participation or interaction.  Problem Solving Problem solving assist level: Solves basic 75 - 89% of the time/requires cueing 10 - 24% of the time  Memory Memory assist level: Recognizes or recalls 90% of the time/requires cueing < 10% of the time    Medical Problem List and Plan: 1.  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 No brace required.   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    Per Neurosurg, repeat scan in 3 months.  2.  DVT  Prophylaxis/Anticoagulation:   Vascular study showing extensive DVT, including right iliac, but IVC appears to be clean  Continue Xarelto  3. Pain Management: Robaxin and oxycodone as needed.  4. Mood: Paxil 20 mg daily, Provide emotional support 5. Neuropsych: This patient is capable of making decisions on her own behalf. 6. Skin/Wound Care: Routine skin checks 7. Fluids/Electrolytes/Nutrition: Routine I&Os  Remeron started 1/29, improved  appreciate RD assistance 8. Hypertension. Norvasc 10 mg daily, Lopressor 100 mg twice a day.   Controlled 2/9 9. 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 10. History hypercoagulable state. INR 1.09. 11. Constipation. Laxative assistive 12. Hypokalemia: Improved  K+ 4.4 on 2/9 13. Leukocytosis: Resolved  Afebrile  Improving 14. ABLA  Hb 9.7 on 2/9   FeSO4 and Vit C started 2/9  Cont to monitor   LOS (Days) 16 A FACE TO FACE EVALUATION WAS PERFORMED  Mescal Flinchbaugh Lorie Phenix 08/09/2016 9:09 AM

## 2016-08-09 NOTE — Plan of Care (Signed)
Problem: RH SKIN INTEGRITY Goal: RH STG ABLE TO PERFORM INCISION/WOUND CARE W/ASSISTANCE STG Able To Perform Incision/Wound Care With Assistance. min  Outcome: Progressing Pt will be able to keep surgical site dry and clean and monitor for problems with final healing process.

## 2016-08-09 NOTE — Progress Notes (Signed)
Speech Language Pathology Daily Session Note  Patient Details  Name: Tay Clinesmith MRN: [id number] Date of Birth: 28-Jul-1955  Today's Date: 08/09/2016 SLP Individual Time: 1430-1500 SLP Individual Time Calculation (min): 30 min  Short Term Goals: Week 2: SLP Short Term Goal 1 (Week 2): Patient will utilize external aids to reorient herself in 3 consecutive sessions with Mod assist question cues  SLP Short Term Goal 2 (Week 2): Patient will verbally respond to questions with no more than 2 reps in 90% of opportunities.  SLP Short Term Goal 3 (Week 2): Patient will demonstrate initiation and cessation of basic tasks with increased wait time and Mod assist verbal cues to self-monitor errors.  SLP Short Term Goal 4 (Week 2): Patient will sustained attention to task 5 minute increments with Mod assist verbal cues for redirection.  SLP Short Term Goal 5 (Week 2): Patient will demonstrate basic problem solving during funcitonal tasks with Mod assist question cues.   SLP Short Term Goal 6 (Week 2): Patient will verbally identify 2 physical and 1 cognitive deficits with Mod question cues.   Skilled Therapeutic Interventions: Skilled treatment session focused on cognitive goals. SLP facilitated session by providing Min A for recall of her current medications and their functions and supervision verbal cues for organization of a BID pill box. Patient left upright in bed with all needs within reach. Continue with current plan of care.      Function:    Cognition Comprehension Comprehension assist level: Understands basic 75 - 89% of the time/ requires cueing 10 - 24% of the time  Expression   Expression assist level: Expresses basic 75 - 89% of the time/requires cueing 10 - 24% of the time. Needs helper to occlude trach/needs to repeat words.  Social Interaction Social Interaction assist level: Interacts appropriately 90% of the time - Needs monitoring or encouragement for participation or  interaction.  Problem Solving Problem solving assist level: Solves basic 75 - 89% of the time/requires cueing 10 - 24% of the time  Memory Memory assist level: Recognizes or recalls 75 - 89% of the time/requires cueing 10 - 24% of the time    Pain No/Denies Pain   Therapy/Group: Individual Therapy  Madaleine Simmon 08/09/2016, 3:47 PM

## 2016-08-09 NOTE — Progress Notes (Signed)
Physical Therapy Session Note  Patient Details  Name: Katrina Donaldson MRN: [id number] Date of Birth: 1956-04-26  Today's Date: 08/09/2016 PT Individual Time: 1103-1200 PT Individual Time Calculation (min): 57 min   Short Term Goals: Week 3:  PT Short Term Goal 1 (Week 3): Pt will be able to complete squat pivot transfer with mod assist (with full clearance of bottom) PT Short Term Goal 2 (Week 3): Pt will be able to tolerate dynamic standing balance for functional task x 1 min with mod assist PT Short Term Goal 3 (Week 3): Pt will be able to perform car transfer with mod assist  Skilled Therapeutic Interventions/Progress Updates:   Pt received sitting in WC and agreeable to PT PT treatment focused on transfers, WC mobility, and BLE neuromotor control.  WC mobility completed with supervision assist once WC set up for 125 and 142ft with min cues for improved use of R UE to navigate obstacles on the R.  Lateral scoot transfers completed x 8 throughout treatment with mod assist to L and R with min cues for proper LE positioning, improved use of BLE to clear seat, and improved head/hips relationship.  Nustep reciprocal movement pattern training competed x 8 minutes with BUE and BLE. Pt provided min cues for proper speed and improved use of BLE as tolerated. Sit<>supine transfer completed with mod assist and max cues to maintain back precautions. Supine NMR including SAQ, heel slides, hip add/abd with manual resistance, bridges. Following treatment patient returned to room and left sitting in [facility] with call bell in reach.   Therapy Documentation Precautions:  Precautions Precautions: Fall, Back Precaution Comments: recalled 3/3 back precautions Restrictions Weight Bearing Restrictions: No  Pain: 0/10 at rest.    See Function Navigator for Current Functional Status.   Therapy/Group: Individual Therapy  Lorie Phenix 08/09/2016, 1:56 PM

## 2016-08-09 NOTE — Progress Notes (Signed)
Occupational Therapy Session Note  Patient Details  Name: Katrina Donaldson MRN: [id number] Date of Birth: 07-20-1955  Today's Date: 08/09/2016 OT Individual Time: 1230-1300 OT Individual Time Calculation (min): 30 min    Short Term Goals: Week 2:  OT Short Term Goal 1 (Week 2): Pt will sit to stand with MOD A in prep for clothing management. OT Short Term Goal 2 (Week 2): Pt will thread pants over feet with SET UP. OT Short Term Goal 3 (Week 2): Pt will bathe seated to was 10/10 body parts with MIN A and AE PRN.   Skilled Therapeutic Interventions/Progress Updates: Patient was eating lunch upon approach and ate a few bites here and there as she participatedi in light cognitive activities while eating.  She required extra time to process this clinician's statements and questions.  As well, she worked on light endurance activities to increase stamina and ability to increase self care and c/o of "tight upper back"     This clinician applied very light pressure on the tight muscles very lateral to surgical site.  As well, patient was able to recall all BAT back precautions but stated, "No arching whatever that means."  This clinician demonstrated and educated the patient on arching the back.  Patient was handed off to PT who came in at end of this session.     Therapy Documentation Precautions:  Precautions Precautions: Fall, Back Precaution Comments: recalled 2/3 back precautions Restrictions Weight Bearing Restrictions: No    Pain:denied but c/o "upper back is tight"   See Function Navigator for Current Functional Status.   Therapy/Group: Individual Therapy  Alfredia Ferguson [facility] 08/09/2016, 1:43 PM

## 2016-08-09 NOTE — Plan of Care (Signed)
Problem: SCI BOWEL ELIMINATION Goal: RH STG SCI MANAGE BOWEL WITH MEDICATION WITH ASSISTANCE STG SCI Manage bowel with medication with assistance. min  Outcome: Progressing Pt will anticipate toileting needs and communicate in timely manner to staff, pt will realize and verbalize need for medication assistance w/bowel movements.

## 2016-08-09 NOTE — Progress Notes (Signed)
Physical Therapy Weekly Progress Note  Patient Details  Name: Katrina Donaldson MRN: [id number] Date of Birth: 1955/08/18  Beginning of progress report period: August 02, 2016 End of progress report period: August 09, 2016  Today's Date: 08/09/2016 PT Individual Time: 1300-1330 PT Individual Time Calculation (min): 30 min  Focused on w/c mobility training for functional mobility and to address attention and problem solving in distracting environments with pt demonstrating overall improvement at supervision level and extra time. Cues for technique and efficiency. Simulated car transfer training with education on differences in car transfer vs. Bed/mat and discussing available options at d/c. Would recommend a sedan at this time for safety and pt may benefit from use of slideboard if she is unable to safely progress clearing her bottom during transfer. Pt completed a scoot/squat with max assist in and out of car and assist by PT to manage BLE.  End of session left in room with sisters present and safety belt donned.    Patient has met 4 of 4 short term goals.  Pt is making functional but slow progress with mobility. Pt currently requires mod assist for basic transfers (scoot pivot) and supervision to min assist for w/c propulsion. Mod to max assist for limited standing activities and lift equipment for gait trials. Cognition appears to be improving though poor awareness, attention, sequencing, problem solving, awareness, and memory continues to impact mobility. Ultimately continue to recommend 24/7 min assist level from family. Per CSW, insurance will not approve SNF which was the plan, so continue to await family clarification on who will provide assist and to where pt will d/c (her home is not accessible via w/c as it has 10+ steps to enter). Will need extensive hands-on family education prior to discharge.   Patient continues to demonstrate the following deficits muscle weakness and muscle joint  tightness, decreased cardiorespiratoy endurance, impaired timing and sequencing, decreased coordination and decreased motor planning, decreased attention to left, decreased initiation, decreased attention, decreased awareness, decreased problem solving, decreased safety awareness, decreased memory and delayed processing and decreased sitting balance, decreased standing balance, decreased postural control and decreased balance strategies and therefore will continue to benefit from skilled PT intervention to increase functional independence with mobility.  Patient progressing toward long term goals..  Continue plan of care. Gait goal downgraded to mod assist due to slow progress.   PT Short Term Goals Week 2:  PT Short Term Goal 1 (Week 2): Pt will be able to perform basic transfers with consistent max assist PT Short Term Goal 1 - Progress (Week 2): Met PT Short Term Goal 2 (Week 2): Pt will be able to propel w/c x 25' with min assist PT Short Term Goal 2 - Progress (Week 2): Met PT Short Term Goal 3 (Week 2): Pt will be able to progress sit <> stand with max assist PT Short Term Goal 3 - Progress (Week 2): Met PT Short Term Goal 4 (Week 2): Pt will be able to perform supine <> sit with mod assist PT Short Term Goal 4 - Progress (Week 2): Met Week 3:  PT Short Term Goal 1 (Week 3): Pt will be able to complete squat pivot transfer with mod assist (with full clearance of bottom) PT Short Term Goal 2 (Week 3): Pt will be able to tolerate dynamic standing balance for functional task x 1 min with mod assist PT Short Term Goal 3 (Week 3): Pt will be able to perform car transfer with mod assist  Skilled Therapeutic  Interventions/Progress Updates:  Ambulation/gait training;Cognitive remediation/compensation;Discharge planning;DME/adaptive equipment instruction;Functional mobility training;Pain management;Splinting/orthotics;Therapeutic Activities;UE/LE Strength taining/ROM;Wheelchair  propulsion/positioning;UE/LE Coordination activities;Therapeutic Exercise;Stair training;Patient/family education;Neuromuscular re-education;Disease management/prevention;Community reintegration;Balance/vestibular training;Psychosocial support;Visual/perceptual remediation/compensation;Skin care/wound management;Functional electrical stimulation   Therapy Documentation Precautions:  Precautions Precautions: Fall, Back Precaution Comments: recalled 2/3 back precautions Restrictions Weight Bearing Restrictions: No   Pain:  Reports soreness in shoulder blades from sitting up in w/c. OT had addressed in prior session.  See Function Navigator for Current Functional Status.  Therapy/Group: Individual Therapy  Canary Brim Ivory Broad, PT, DPT  08/09/2016, 1:48 PM

## 2016-08-10 ENCOUNTER — Inpatient Hospital Stay (HOSPITAL_COMMUNITY): Payer: BC Managed Care – PPO | Admitting: Physical Therapy

## 2016-08-10 DIAGNOSIS — R29898 Other symptoms and signs involving the musculoskeletal system: Secondary | ICD-10-CM

## 2016-08-10 NOTE — Progress Notes (Signed)
[facility]ICINE & REHABILITATION     PROGRESS NOTE  Subjective/Complaints:  Pt seen laying in bed this AM.  She slept well overnight.  She has questions about improvement of her blood clot.    ROS: Denies nausea, vomiting, diarrhea, shortness of breath or chest pain  Objective: Vital Signs: Blood pressure (!) 146/83, pulse 64, temperature 98.7 F (37.1 C), temperature source Oral, resp. rate 18, height 5\' 5"  (1.651 m), weight 86 kg (189 lb 9.5 oz), SpO2 99 %. No results found.  Recent Labs  08/09/16 0553  WBC 5.1  HGB 9.7*  HCT 31.2*  PLT 463*    Recent Labs  08/09/16 0553  NA 140  K 4.4  CL 106  GLUCOSE 92  BUN 12  CREATININE 0.89  CALCIUM 8.4*   CBG (last 3)  No results for input(s): GLUCAP in the last 72 hours.  Wt Readings from Last 3 Encounters:  07/24/16 86 kg (189 lb 9.5 oz)  07/19/16 90.8 kg (200 lb 2.8 oz)  07/21/15 85.7 kg (189 lb)    Physical Exam:  BP (!) 146/83 (BP Location: Right Arm)   Pulse 64   Temp 98.7 F (37.1 C) (Oral)   Resp 18   Ht 5\' 5"  (1.651 m)   Wt 86 kg (189 lb 9.5 oz)   SpO2 99%   BMI 31.55 kg/m  Constitutional: She appears well-developed. Obese  HENT: Normocephalic and atraumatic.  Eyes: EOM are normal. No discharge. Cardiovascular: RRR. No JVD. Respiratory: Clear bilaterally. Unlabored.  GI: Soft. Bowel sounds are normal.   Musculoskeletal: She exhibits no tenderness or edema.  Neurological: She is alert and oriented x3 with cues for date of month. Follows full commands.  Motor: LUE: 4+/5 proximal to distal LLE: 4-/5 HF,KE, 4+/5 ADF/PF RUE: 4+/5 proximal to distal RLE: HF, KE 2+-3-/5, ADF/PF 4/5.  Skin: Skin is warm and dry.  Back incision c/d/i. Psychiatric: She has a normal mood and affect.   Assessment/Plan: 1. Functional deficits secondary to myelopathy which require 3+ hours per day of interdisciplinary therapy in a comprehensive inpatient rehab setting. Physiatrist is providing close team  supervision and 24 hour management of active medical problems listed below. Physiatrist and rehab team continue to assess barriers to discharge/monitor patient progress toward functional and medical goals.  Function:  Bathing Bathing position   Position: Wheelchair/chair at sink  Bathing parts Body parts bathed by patient: Right arm, Left arm, Chest, Abdomen Body parts bathed by helper: Buttocks  Bathing assist Assist Level: Supervision or verbal cues      Upper Body Dressing/Undressing Upper body dressing   What is the patient wearing?: Pull over shirt/dress     Pull over shirt/dress - Perfomed by patient: Thread/unthread right sleeve, Thread/unthread left sleeve, Put head through opening Pull over shirt/dress - Perfomed by helper: Pull shirt over trunk        Upper body assist Assist Level: Supervision or verbal cues      Lower Body Dressing/Undressing Lower body dressing   What is the patient wearing?: Pants, Socks, Shoes   Underwear - Performed by helper: Thread/unthread right underwear leg, Thread/unthread left underwear leg, Pull underwear up/down Pants- Performed by patient: Thread/unthread right pants leg, Thread/unthread left pants leg Pants- Performed by helper: Pull pants up/down   Non-skid slipper socks- Performed by helper: Don/doff right sock, Don/doff left sock Socks - Performed by patient: Don/doff right sock, Don/doff left sock Socks - Performed by helper: Don/doff left sock, Don/doff right sock Shoes -  Performed by patient: Don/doff right shoe, Don/doff left shoe (elastic laces, shoe funnel) Shoes - Performed by helper: Don/doff right shoe          Lower body assist Assist for lower body dressing: Assistive device, Touching or steadying assistance (Pt > 75%) Assistive Device Comment: shoe funnel, reacher, elastic laces, sock aid    Toileting Toileting Toileting activity did not occur:  (pt not voiding, doing scans & caths q 8 hours)   Toileting steps  completed by helper: Adjust clothing prior to toileting, Performs perineal hygiene, Adjust clothing after toileting Toileting Assistive Devices: Grab bar or rail  Toileting assist Assist level: Two helpers (stedy)   Transfers Chair/bed transfer   Chair/bed transfer method: Lateral scoot Chair/bed transfer assist level: Touching or steadying assistance (Pt > 75%) Chair/bed transfer assistive device: Armrests Mechanical lift: Stedy   Locomotion Ambulation Ambulation activity did not occur: Safety/medical concerns   Max distance: 12 Assist level: Moderate assist (Pt 50 - 74%)   Wheelchair   Type: Manual Max wheelchair distance: 160 Assist Level: Supervision or verbal cues  Cognition Comprehension Comprehension assist level: Understands basic 75 - 89% of the time/ requires cueing 10 - 24% of the time  Expression Expression assist level: Expresses basic 75 - 89% of the time/requires cueing 10 - 24% of the time. Needs helper to occlude trach/needs to repeat words.  Social Interaction Social Interaction assist level: Interacts appropriately 90% of the time - Needs monitoring or encouragement for participation or interaction.  Problem Solving Problem solving assist level: Solves basic 75 - 89% of the time/requires cueing 10 - 24% of the time  Memory Memory assist level: Recognizes or recalls 75 - 89% of the time/requires cueing 10 - 24% of the time    Medical Problem List and Plan: 1.  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 No brace required.   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    Per Neurosurg, repeat scan in 3 months.  2.  DVT Prophylaxis/Anticoagulation:    Vascular study showing extensive DVT, including right iliac, but IVC appears to be clean  Continue Xarelto  3. Pain Management: Robaxin and oxycodone as needed.  4. Mood: Paxil 20 mg daily, Provide emotional support 5. Neuropsych: This patient is capable of making decisions on her own behalf. 6. Skin/Wound Care: Routine skin checks 7. Fluids/Electrolytes/Nutrition: Routine I&Os  Remeron started 1/29, improved  appreciate RD assistance 8. Hypertension. Norvasc 10 mg daily, Lopressor 100 mg twice a day.   Slightly elevated this AM, otherwise controlled 9. 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 10. History hypercoagulable state. INR 1.09. 11. Constipation. Laxative assistive 12. Hypokalemia: Improved  K+ 4.4 on 2/9 13. Leukocytosis: Resolved  Afebrile  Improving 14. ABLA  Hb 9.7 on 2/9   FeSO4 and Vit C started 2/9  Cont to monitor   LOS (Days) 17 A FACE TO FACE EVALUATION WAS PERFORMED  Ankit Lorie Phenix 08/10/2016 12:01 PM

## 2016-08-10 NOTE — Progress Notes (Signed)
Physical Therapy Session Note  Patient Details  Name: Katrina Donaldson MRN: [id number] Date of Birth: April 11, 1956  Today's Date: 08/10/2016 PT Individual Time: 0900-0945 PT Individual Time Calculation (min): 45 min   Short Term Goals: Week 3:  PT Short Term Goal 1 (Week 3): Pt will be able to complete squat pivot transfer with mod assist (with full clearance of bottom) PT Short Term Goal 2 (Week 3): Pt will be able to tolerate dynamic standing balance for functional task x 1 min with mod assist PT Short Term Goal 3 (Week 3): Pt will be able to perform car transfer with mod assist Week 4:     Skilled Therapeutic Interventions/Progress Updates:   Pt received supine in bed and agreeable to PT. Supine>sit transfer with min  Assist to maintain back precautions.  and min cues for safety  Lateal scoot transfer to [facility] with mod assist from PT with min cues for head/hips relationship.  WC mobility to rehab gym x 185f with supervision assist from PT with min cues for improved use of LUE to turn WC to R, .   Sit<>stand in parallel bars with max assist from PT. Gait in parallel bars x 183fforward and backward with PT to provide mod assist to block R knee to prevent buckling PT fit patient for RLE AFO with lateral Upright to prevent pressure on navicular doe to excessive pronation.   Sit<>stand with RW and max assist+2 x 1. and Max assist of 1 x1. Moderate cues for proper UE placement   Gait with RW for 1269fith mod assist x 2 bouts with RAFO. Mod cues for AD management and tactile cues to improved LLE knee stability.   Pt returned to room and [facility] and all needs met.         Therapy Documentation Precautions:  Precautions Precautions: Fall, Back Precaution Comments: recalled 2/3 back precautions Restrictions Weight Bearing Restrictions: No Vital Signs: Therapy Vitals Temp: 98.7 F (37.1 C) Temp Source: Oral Pulse Rate: 64 Resp: 18 BP: (!)  146/83 Patient Position (if appropriate): Lying Oxygen Therapy SpO2: 99 % O2 Device: Not Delivered Pain: 0/10   See Function Navigator for Current Functional Status.   Therapy/Group: Individual Therapy  AusLorie Phenix10/2018, 9:48 AM

## 2016-08-11 ENCOUNTER — Inpatient Hospital Stay (HOSPITAL_COMMUNITY): Payer: BC Managed Care – PPO

## 2016-08-11 NOTE — Progress Notes (Signed)
[facility] PHYSICAL MEDICINE & REHABILITATION     PROGRESS NOTE  Subjective/Complaints:  Pt seen laying in bed this AM.  She slept well overnight. She does not have any complaints this AM.  ROS: Denies nausea, vomiting, diarrhea, shortness of breath or chest pain  Objective: Vital Signs: Blood pressure 120/60, pulse 60, temperature 97.9 F (36.6 C), temperature source Oral, resp. rate 18, height 5\' 5"  (1.651 m), weight 86 kg (189 lb 9.5 oz), SpO2 99 %. No results found.  Recent Labs  08/09/16 0553  WBC 5.1  HGB 9.7*  HCT 31.2*  PLT 463*    Recent Labs  08/09/16 0553  NA 140  K 4.4  CL 106  GLUCOSE 92  BUN 12  CREATININE 0.89  CALCIUM 8.4*   CBG (last 3)  No results for input(s): GLUCAP in the last 72 hours.  Wt Readings from Last 3 Encounters:  07/24/16 86 kg (189 lb 9.5 oz)  07/19/16 90.8 kg (200 lb 2.8 oz)  07/21/15 85.7 kg (189 lb)    Physical Exam:  BP 120/60 (BP Location: Right Arm)   Pulse 60   Temp 97.9 F (36.6 C) (Oral)   Resp 18   Ht 5\' 5"  (1.651 m)   Wt 86 kg (189 lb 9.5 oz)   SpO2 99%   BMI 31.55 kg/m  Constitutional: She appears well-developed. Obese  HENT: Normocephalic and atraumatic.  Eyes: EOM are normal. No discharge. Cardiovascular: RRR. No JVD. Respiratory: Clear bilaterally. Unlabored.  GI: Soft. Bowel sounds are normal.   Musculoskeletal: She exhibits no tenderness or edema.  Neurological: She is alert and oriented x3. Follows full commands.  Motor: LUE: 4+/5 proximal to distal LLE: 4-/5 HF,KE, 4+/5 ADF/PF RUE: 4+/5 proximal to distal RLE: HF 3/5, KE 2+/5, ADF/PF 4/5.  Skin: Skin is warm and dry.  Back incision c/d/i. Psychiatric: She has a normal mood and affect.   Assessment/Plan: 1. Functional deficits secondary to myelopathy which require 3+ hours per day of interdisciplinary therapy in a comprehensive inpatient rehab setting. Physiatrist is providing close team supervision and 24 hour management of active medical  problems listed below. Physiatrist and rehab team continue to assess barriers to discharge/monitor patient progress toward functional and medical goals.  Function:  Bathing Bathing position   Position: Wheelchair/chair at sink  Bathing parts Body parts bathed by patient: Right arm, Left arm, Chest, Abdomen Body parts bathed by helper: Buttocks  Bathing assist Assist Level: Supervision or verbal cues      Upper Body Dressing/Undressing Upper body dressing   What is the patient wearing?: Pull over shirt/dress     Pull over shirt/dress - Perfomed by patient: Thread/unthread right sleeve, Thread/unthread left sleeve, Put head through opening, Pull shirt over trunk Pull over shirt/dress - Perfomed by helper: Pull shirt over trunk        Upper body assist Assist Level: Supervision or verbal cues, Set up   Set up : To obtain clothing/put away  Lower Body Dressing/Undressing Lower body dressing   What is the patient wearing?: Pants, Shoes   Underwear - Performed by helper: Thread/unthread right underwear leg, Thread/unthread left underwear leg, Pull underwear up/down Pants- Performed by patient: Thread/unthread right pants leg, Thread/unthread left pants leg Pants- Performed by helper: Thread/unthread right pants leg, Thread/unthread left pants leg, Pull pants up/down   Non-skid slipper socks- Performed by helper: Don/doff right sock, Don/doff left sock Socks - Performed by patient: Don/doff right sock, Don/doff left sock Socks - Performed by helper:  Don/doff left sock, Don/doff right sock Shoes - Performed by patient: Don/doff right shoe, Don/doff left shoe (elastic laces, shoe funnel) Shoes - Performed by helper: Don/doff right shoe, Don/doff left shoe          Lower body assist Assist for lower body dressing: 2 Helpers Assistive Device Comment: shoe funnel, reacher, elastic laces, sock aid    Toileting Toileting Toileting activity did not occur:  (pt not voiding, doing scans  & caths q 8 hours)   Toileting steps completed by helper: Adjust clothing prior to toileting, Performs perineal hygiene, Adjust clothing after toileting Toileting Assistive Devices: Grab bar or rail  Toileting assist Assist level: Two helpers (stedy)   Transfers Chair/bed transfer   Chair/bed transfer method: Stand pivot Chair/bed transfer assist level: 2 helpers Chair/bed transfer assistive device: Armrests, Environmental manager lift: Architectural technologist activity did not occur: Safety/medical concerns   Max distance: 7' Assist level: 2 helpers (mod/max of 1 with +2 for w/c follow)   Wheelchair   Type: Manual Max wheelchair distance: 100' Assist Level: Supervision or verbal cues  Cognition Comprehension Comprehension assist level: Understands basic 75 - 89% of the time/ requires cueing 10 - 24% of the time  Expression Expression assist level: Expresses basic 75 - 89% of the time/requires cueing 10 - 24% of the time. Needs helper to occlude trach/needs to repeat words.  Social Interaction Social Interaction assist level: Interacts appropriately 90% of the time - Needs monitoring or encouragement for participation or interaction.  Problem Solving Problem solving assist level: Solves basic 75 - 89% of the time/requires cueing 10 - 24% of the time  Memory Memory assist level: Recognizes or recalls 75 - 89% of the time/requires cueing 10 - 24% of the time    Medical Problem List and Plan: 1.  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 No brace required.   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    Per Neurosurg, repeat scan in 3 months.  2.  DVT  Prophylaxis/Anticoagulation:   Vascular study showing extensive DVT, including right iliac, but IVC appears to be clean  Continue Xarelto  3. Pain Management: Robaxin and oxycodone as needed.  4. Mood: Paxil 20 mg daily, Provide emotional support 5. Neuropsych: This patient is capable of making decisions on her own behalf. 6. Skin/Wound Care: Routine skin checks 7. Fluids/Electrolytes/Nutrition: Routine I&Os  Remeron started 1/29,  Eating baseline  appreciate RD assistance 8. Hypertension. Norvasc 10 mg daily, Lopressor 100 mg twice a day.   Controlled 2/11 9. 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 10. History hypercoagulable state. INR 1.09. 11. Constipation. Laxative assistive 12. Hypokalemia: Improved  K+ 4.4 on 2/9 13. Leukocytosis: Resolved  Afebrile  Improving 14. ABLA  Hb 9.7 on 2/9   FeSO4 and Vit C started 2/9  Cont to monitor   LOS (Days) 18 A FACE TO FACE EVALUATION WAS PERFORMED  Dmari Schubring Lorie Phenix 08/11/2016 3:55 PM

## 2016-08-11 NOTE — Progress Notes (Signed)
Physical Therapy Session Note  Patient Details  Name: Katrina Donaldson MRN: [id number] Date of Birth: 21-Sep-1955  Today's Date: 08/11/2016 PT Individual Time: 1100-1200 PT Individual Time Calculation (min): 60 min   Short Term Goals: Week 3:  PT Short Term Goal 1 (Week 3): Pt will be able to complete squat pivot transfer with mod assist (with full clearance of bottom) PT Short Term Goal 2 (Week 3): Pt will be able to tolerate dynamic standing balance for functional task x 1 min with mod assist PT Short Term Goal 3 (Week 3): Pt will be able to perform car transfer with mod assist  Skilled Therapeutic Interventions/Progress Updates:   Dressing EOB to don shirt and pants. Pt able to complete shirt with supervision and maintained balance with 1 episode of LOB posterior with pt able to correct. Mod assist with +2 for safety and assist for pulling up pants in standing. Required cues for technique and hand placement and maintaining upright posture. +2 stand step transfer with RW to w/c with assist for advancing RW and cues for sequencing and foot placement. 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 She states as a child she wore arch support but had stopped wearing any inserts long ago due to discomfort. At this time, she declines addressing any inserts in her shoes. Completed session without AFO but did trial R hinged knee brace (too small) to allow for feedback for R knee instability. During sit <> stands and gait, best results achieved with manual facilitation at R knee. Pt denies notable difference with brace. Pt able to progress sit <> stands from mod to min assist with cues for hand placement. During gait, required mod to max assist for advancement of RW and to facilitate R knee for stability with cues for upright posture and sequencing x 6' before fatigued. Addressed blocked practice squat pivot transfers with emphasis on clearance of bottom and technique  with mod assist demonstrating improvement in carryover. Instructed in w/c parts management set-up for transfer sequencing. Pt making good progress this session.   Therapy Documentation Precautions:  Precautions Precautions: Fall, Back Precaution Comments: recalled 2/3 back precautions Restrictions Weight Bearing Restrictions: No   Pain: No pain.    See Function Navigator for Current Functional Status.   Therapy/Group: Individual Therapy  Canary Brim Ivory Broad, PT, DPT  08/11/2016, 12:16 PM

## 2016-08-12 ENCOUNTER — Inpatient Hospital Stay (HOSPITAL_COMMUNITY): Payer: BC Managed Care – PPO | Admitting: Speech Pathology

## 2016-08-12 ENCOUNTER — Inpatient Hospital Stay (HOSPITAL_COMMUNITY): Payer: BC Managed Care – PPO | Admitting: Occupational Therapy

## 2016-08-12 ENCOUNTER — Ambulatory Visit (HOSPITAL_COMMUNITY): Payer: BC Managed Care – PPO | Admitting: Psychology

## 2016-08-12 ENCOUNTER — Inpatient Hospital Stay (HOSPITAL_COMMUNITY): Payer: BC Managed Care – PPO | Admitting: *Deleted

## 2016-08-12 NOTE — Progress Notes (Signed)
[facility]     PROGRESS NOTE  Subjective/Complaints:  Pt seen laying in bed this AM eating breakfast.  She jokingly asks me, if I would like some.  She slept well overnight and appears to be in better spirits.   ROS: Denies nausea, vomiting, diarrhea, shortness of breath or chest pain  Objective: Vital Signs: Blood pressure (!) 143/80, pulse 67, temperature 98.8 F (37.1 C), temperature source Oral, resp. rate 18, height 5\' 5"  (1.651 m), weight 86 kg (189 lb 9.5 oz), SpO2 98 %. No results found. No results for input(s): WBC, HGB, HCT, PLT in the last 72 hours. No results for input(s): NA, K, CL, GLUCOSE, BUN, CREATININE, CALCIUM in the last 72 hours.  Invalid input(s): CO CBG (last 3)  No results for input(s): GLUCAP in the last 72 hours.  Wt Readings from Last 3 Encounters:  07/24/16 86 kg (189 lb 9.5 oz)  07/19/16 90.8 kg (200 lb 2.8 oz)  07/21/15 85.7 kg (189 lb)    Physical Exam:  BP (!) 143/80   Pulse 67   Temp 98.8 F (37.1 C) (Oral)   Resp 18   Ht 5\' 5"  (1.651 m)   Wt 86 kg (189 lb 9.5 oz)   SpO2 98%   BMI 31.55 kg/m  Constitutional: She appears well-developed. Obese  HENT: Normocephalic and atraumatic.  Eyes: EOM are normal. No discharge. Cardiovascular: RRR. No JVD. Respiratory: Clear bilaterally. Unlabored.  GI: Soft. Bowel sounds are normal.   Musculoskeletal: She exhibits no tenderness or edema.  Neurological: She is alert and oriented x3. Follows full commands.  Motor: LUE: 4+/5 proximal to distal LLE: 4/5 HF,KE, 4+/5 ADF/PF RUE: 4+/5 proximal to distal RLE: HF 3/5, KE 2+/5, ADF/PF 4/5 (stable).  Skin: Skin is warm and dry.  Back incision c/d/i. Psychiatric: She has a normal mood and affect.   Assessment/Plan: 1. Functional deficits secondary to myelopathy which require 3+ hours per day of interdisciplinary therapy in a comprehensive inpatient rehab setting. Physiatrist is providing close team supervision and 24  hour management of active medical problems listed below. Physiatrist and rehab team continue to assess barriers to discharge/monitor patient progress toward functional and medical goals.  Function:  Bathing Bathing position   Position: Wheelchair/chair at sink  Bathing parts Body parts bathed by patient: Right arm, Left arm, Chest, Abdomen Body parts bathed by helper: Buttocks  Bathing assist Assist Level: Supervision or verbal cues      Upper Body Dressing/Undressing Upper body dressing   What is the patient wearing?: Pull over shirt/dress     Pull over shirt/dress - Perfomed by patient: Thread/unthread right sleeve, Thread/unthread left sleeve, Put head through opening, Pull shirt over trunk Pull over shirt/dress - Perfomed by helper: Pull shirt over trunk        Upper body assist Assist Level: Supervision or verbal cues, Set up   Set up : To obtain clothing/put away  Lower Body Dressing/Undressing Lower body dressing   What is the patient wearing?: Pants, Shoes   Underwear - Performed by helper: Thread/unthread right underwear leg, Thread/unthread left underwear leg, Pull underwear up/down Pants- Performed by patient: Thread/unthread right pants leg, Thread/unthread left pants leg Pants- Performed by helper: Thread/unthread right pants leg, Thread/unthread left pants leg, Pull pants up/down   Non-skid slipper socks- Performed by helper: Don/doff right sock, Don/doff left sock Socks - Performed by patient: Don/doff right sock, Don/doff left sock Socks - Performed by helper: Don/doff left sock, Don/doff right  sock Shoes - Performed by patient: Don/doff right shoe, Don/doff left shoe (elastic laces, shoe funnel) Shoes - Performed by helper: Don/doff right shoe, Don/doff left shoe          Lower body assist Assist for lower body dressing: 2 Helpers Assistive Device Comment: shoe funnel, reacher, elastic laces, sock aid    Toileting Toileting Toileting activity did not  occur:  (pt not voiding, doing scans & caths q 8 hours)   Toileting steps completed by helper: Adjust clothing prior to toileting, Performs perineal hygiene, Adjust clothing after toileting Toileting Assistive Devices: Grab bar or rail  Toileting assist Assist level: Two helpers (stedy)   Transfers Chair/bed transfer Chair/bed transfer activity did not occur: Safety/medical concerns Chair/bed transfer method: Stand pivot Chair/bed transfer assist level: 2 helpers Chair/bed transfer assistive device: Armrests, Environmental manager lift: Architectural technologist activity did not occur: Safety/medical concerns   Max distance: 7' Assist level: 2 helpers (mod/max of 1 with +2 for w/c follow)   Wheelchair   Type: Manual Max wheelchair distance: 100' Assist Level: Supervision or verbal cues  Cognition Comprehension Comprehension assist level: Follows complex conversation/direction with extra time/assistive device  Expression Expression assist level: Expresses complex ideas: With extra time/assistive device  Social Interaction Social Interaction assist level: Interacts appropriately 90% of the time - Needs monitoring or encouragement for participation or interaction.  Problem Solving Problem solving assist level: Solves complex problems: With extra time  Memory Memory assist level: Recognizes or recalls 90% of the time/requires cueing < 10% of the time    Medical Problem List and Plan: 1.  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 No brace required.   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    Per Neurosurg, repeat scan in 3 months.  2.  DVT  Prophylaxis/Anticoagulation:   Vascular study showing extensive DVT, including right iliac, but IVC appears to be clean  Continue Xarelto  3. Pain Management: Robaxin and oxycodone as needed.  4. Mood: Paxil 20 mg daily, Provide emotional support 5. Neuropsych: This patient is capable of making decisions on her own behalf. 6. Skin/Wound Care: Routine skin checks 7. Fluids/Electrolytes/Nutrition: Routine I&Os  Remeron started 1/29,  Eating baseline  appreciate RD assistance 8. Hypertension. Norvasc 10 mg daily, Lopressor 100 mg twice a day.   Overall controlled 2/12 9. 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 10. History hypercoagulable state. INR 1.09. 11. Constipation. Laxative assistive 12. Hypokalemia: Improved  K+ 4.4 on 2/9 13. Leukocytosis: Resolved  Afebrile  Improving 14. ABLA  Hb 9.7 on 2/9   FeSO4 and Vit C started 2/9  Labs ordered for tomorrow  Cont to monitor   LOS (Days) 19 A FACE TO FACE EVALUATION WAS PERFORMED  Sonya Gunnoe Lorie Phenix 08/12/2016 10:07 AM

## 2016-08-12 NOTE — Progress Notes (Signed)
Occupational Therapy Session Note  Patient Details  Name: Katrina Donaldson MRN: [id number] Date of Birth: 1956-05-05  Today's Date: 08/12/2016 OT Individual Time: [id number] OT Individual Time Calculation (min): 73 min    Short Term Goals: Week 2:  OT Short Term Goal 1 (Week 2): Pt will sit to stand with MOD A in prep for clothing management. OT Short Term Goal 2 (Week 2): Pt will thread pants over feet with SET UP. OT Short Term Goal 3 (Week 2): Pt will bathe seated to was 10/10 body parts with MIN A and AE PRN.   Skilled Therapeutic Interventions/Progress Updates:     Upon entering the room, pt supine in bed with no c/o pain this session. Pt agreeable to OT intervention and remembers back precautions for supine >sit and pt performs log roll with min A to EOB. Pt declines bathing but requests dressing on EOB. Pt performs UB self care with set up A. LB dressing performed with use of AE for increased independence . Pt utilized reacher to thread pants over B feet. Pt standing with mod A and use of RW while therapist pulled pants over B hips. Pt utilized show funnel and reacher to don B shoes with assistance for L LE. Pt performed stand and step transfer by taking 6 steps to R with use of RW and max A to wheelchair. Pt unable to come to full stand and knees bent when taking steps. Pt propelled wheelchair for cardiovascular endurance and UB strengthening. Pt returned to room at end of session with call bell within reach.    Therapy Documentation Precautions:  Precautions Precautions: Fall, Back Precaution Comments: recalled 2/3 back precautions Restrictions Weight Bearing Restrictions: No General:   Vital Signs: Therapy Vitals Pulse Rate: 67 BP: (!) 143/80 Pain: Pain Assessment Pain Assessment: No/denies pain ADL: ADL ADL Comments: refer to functional navigator Exercises:   Other Treatments:    See Function Navigator for Current Functional Status.   Therapy/Group: Individual  Therapy  Gypsy Decant 08/12/2016, 12:49 PM

## 2016-08-12 NOTE — Progress Notes (Signed)
Physical Therapy Session Note  Patient Details  Name: Katrina Donaldson MRN: [id number] Date of Birth: [birth year]  Today's Date: 08/12/2016 PT Individual Time: [id number] PT Individual Time Calculation (min): 58 min   Short Term Goals: Week 3:  PT Short Term Goal 1 (Week 3): Pt will be able to complete squat pivot transfer with mod assist (with full clearance of bottom) PT Short Term Goal 2 (Week 3): Pt will be able to tolerate dynamic standing balance for functional task x 1 min with mod assist PT Short Term Goal 3 (Week 3): Pt will be able to perform car transfer with mod assist  Skilled Therapeutic Interventions/Progress Updates: Pt presented in w/c completing speech session. Performed sit to/from stand from w/c x4 attempts. Required maxA x1 + 1 for chair follow. Multimodal cues for hand placement and increasing forward wt shift (with maintaining spinal precautions). Gait training 45ft with RW with max progressing to modA +1 chair follow. Pt performed squat pivot transfer to bed with modA and cues for sequencing. Sit to supine with modA for LE placement. Participated in supine LE therex for increased tolerance to standing and gait. Performed SAQ, hip abd/add, AASLR, heel slides, AA HS pulls with yellow therapy ball x 10 bilaterally. Session ended with NT present to perform in/out cath.      Therapy Documentation Precautions:  Precautions Precautions: Fall, Back Precaution Comments: recalled 2/3 back precautions Restrictions Weight Bearing Restrictions: No   See Function Navigator for Current Functional Status.   Therapy/Group: Individual Therapy  Shalina Norfolk  Jorje Vanatta, PTA  08/12/2016, 4:08 PM

## 2016-08-12 NOTE — Progress Notes (Signed)
Patient:   Katrina Donaldson   DOB:   1955-12-21  MR Number:  [id number]  Location:  [facility] [id number] [place] [address] Dept: [phone] Loc: [id number]           Date of Service:   08/12/2016  Start Time:   8:02 AM End Time:   9 AM  Provider/Observer:  Edgardo Roys PSYD       Billing Code/Service: [phone]  Chief Complaint:     Chief Complaint  Patient presents with  . Memory Loss  . Anxiety  . Depression    Reason for Service:  Patient was referred for neuropsychological/psychological consultation. The patient recently had surgery due to a spinal arachnoid cyst removal. And is recovering from that and working on physical therapy and recovering from the surgery. However, prior to this event the patient had a subarachnoid hemorrhage a couple of years ago.  The patient reportrs that she had a short rehab stay after that event.  She reports that she returned to baseline and went back home.  She reports that she started having balance issues and saw her primary physician who had her do some physical therapy. However, after she was not getting better from this intervention the patient's daughter had her go see a neurologist. Concerns about work because of her problems led to an MRI which identified the spinal arachnoid cyst. The patient had surgery to remove this cyst and was transferred here for rehabilitation and recovery.  The patient reports that she is feeling well and that she has not been having cognitive issues.  She reports that she is recovering well.  However, review with staff suggest that the patient has not always been fully willing to due all of the therapeutic interventions needed.  Some providers have seen variations in cognitive functioning and concerns about neuropsychological functioning lead to referral.  Curent Status:  The patient appeared in good spirits during my  visit. However, she did tend to downplay some of the things that have happened and attributed much of the recovery to external factors that she was not in control over.  Reliability of Information: Information was provided by the patient as well as consultation with staff members and review of available medical records.  Behavioral Observation: Katrina Donaldson  presents as a 61 y.o.-year-old Right African American Female who appeared her stated age. her dress was Appropriate and she was Well Groomed and her manners were Appropriate to the situation.  her participation was indicative of Appropriate behaviors.  There were physical limitations noted.  she displayed an appropriate level of cooperation and motivation.     Interactions:    Active Appropriate and Sharing  Attention:   The patient appeared to be somewhat distracted by internal preoccupations but otherwise attentive. and attention span appeared shorter than expected for age  Memory:   within normal limits; There have been concerns about variability in memory and cognitive functioning.  Visuo-spatial:  within normal limits  Speech (Volume):  normal  Speech:   normal;   Thought Process:  Coherent and Relevant  Though Content:  WNL;   Orientation:   person, place, time/date and situation  Judgment:   Fair  Planning:   Fair  Affect:    Depressed  Mood:    Depressed  Insight:   Fair  Intelligence:   normal  Current Employment: The patient is not working and is retired.  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  She retired in 2005 after a blood clot in her leg.  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    Substance Use:  No concerns of substance abuse are reported.    Education:   Engineering geologist History:   Past Medical History:  Diagnosis Date  . Brain aneurysm   . Depression   . History of DVT (deep vein thrombosis)   .  Hypercoagulable state ([place])   . Hypertension   . Hypothyroidism   . Stroke [facility]) 2016   [facility] 11/29/14        Facility-Administered Encounter Medications as of 08/12/2016  Medication  . acetaminophen (TYLENOL) tablet 650 mg   Or  . acetaminophen (TYLENOL) suppository 650 mg  . amLODipine (NORVASC) tablet 10 mg  . bisacodyl (DULCOLAX) suppository 10 mg  . docusate sodium (COLACE) capsule 100 mg  . feeding supplement (ENSURE ENLIVE) (ENSURE ENLIVE) liquid 237 mL  . ferrous sulfate tablet 325 mg  . fluticasone (FLONASE) 50 MCG/ACT nasal spray 1 spray  . methocarbamol (ROBAXIN) tablet 500 mg   Or  . methocarbamol (ROBAXIN) 500 mg in dextrose 5 % 50 mL IVPB  . metoprolol (LOPRESSOR) tablet 100 mg  . mirtazapine (REMERON) tablet 15 mg  . multivitamin with minerals tablet 1 tablet  . naphazoline-glycerin (CLEAR EYES) ophth solution 1-2 drop  . ondansetron (ZOFRAN) tablet 4 mg   Or  . ondansetron (ZOFRAN) injection 4 mg  . oxyCODONE-acetaminophen (PERCOCET/ROXICET) 5-325 MG per tablet 1-2 tablet  . pantoprazole (PROTONIX) EC tablet 40 mg  . PARoxetine (PAXIL) tablet 20 mg  . Rivaroxaban (XARELTO) tablet 15 mg   Followed by  . [START ON 08/16/2016] rivaroxaban (XARELTO) tablet 20 mg  . senna (SENOKOT) tablet 8.6 mg  . simethicone (MYLICON) chewable tablet 80-160 mg  . sorbitol 70 % solution 30 mL  . thiamine (VITAMIN B-1) tablet 100 mg  . vitamin C (ASCORBIC ACID) tablet 250 mg   Outpatient Encounter Prescriptions as of 08/12/2016  Medication Sig  . acetaminophen (TYLENOL) 500 MG tablet Take 500-1,000 mg by mouth every 6 (six) hours as needed (for pain).  Marland Kitchen amLODipine (NORVASC) 10 MG tablet Take 10 mg by mouth daily.  Marland Kitchen aspirin EC 81 MG tablet Take 81 mg by mouth daily.  . fluticasone (FLONASE) 50 MCG/ACT nasal spray Place 1 spray into both nostrils daily.  . methocarbamol (ROBAXIN) 750 MG tablet Take 750 mg by mouth 3 (three) times daily as needed. For muscle spasms/back pain.  .  metoprolol (LOPRESSOR) 100 MG tablet Take 100 mg by mouth 2 (two) times daily.  Marland Kitchen omeprazole (PRILOSEC) 20 MG capsule Take 20 mg by mouth daily.  Marland Kitchen oxyCODONE-acetaminophen (PERCOCET/ROXICET) 5-325 MG tablet Take 1-2 tablets by mouth every 4 (four) hours as needed for moderate pain.  Marland Kitchen PARoxetine (PAXIL) 20 MG tablet Take 20 mg by mouth daily.  . simethicone (MYLICON) 80 MG chewable tablet Chew 80-160 mg by mouth every 6 (six) hours as needed for flatulence.  . thiamine 250 MG tablet Take 125 mg by mouth daily.          Sexual History:   History  Sexual Activity  . Sexual activity: Not on file    Abuse/Trauma History: Patient denies any significant history of abuse or trauma.  Psychiatric History:  No significant prior psych history.  Family Med/Psych History: No family history on file.  Risk of Suicide/Violence:  virtually non-existent Pt denies SI or HI ideations  Impression/DX:  The patient has had a past subarachnoid hemorrhage that she reports she recovered well from.  However, some indications of cognitive disorder at times and some variation cognitive functioning noted.    Disposition/Plan:  Will do some formal testing either Tuesday afternoon or Wed 2/14.    Diagnosis:    Cognitive disorder  Spinal arachnoid cyst  Moderate episode of recurrent major depressive disorder (HCC)  SAH (subarachnoid hemorrhage) ([place])         Electronically Signed   _______________________ Ilean Skill, Psy.D.

## 2016-08-12 NOTE — Progress Notes (Signed)
Speech Language Pathology Daily Session Note  Patient Details  Name: Katrina Donaldson MRN: [id number] Date of Birth: Jun 21, 1956  Today's Date: 08/12/2016 SLP Individual Time: [id number] SLP Individual Time Calculation (min): 37 min  Short Term Goals: Week 2: SLP Short Term Goal 1 (Week 2): Patient will utilize external aids to reorient herself in 3 consecutive sessions with Mod assist question cues  SLP Short Term Goal 2 (Week 2): Patient will verbally respond to questions with no more than 2 reps in 90% of opportunities.  SLP Short Term Goal 3 (Week 2): Patient will demonstrate initiation and cessation of basic tasks with increased wait time and Mod assist verbal cues to self-monitor errors.  SLP Short Term Goal 4 (Week 2): Patient will sustained attention to task 5 minute increments with Mod assist verbal cues for redirection.  SLP Short Term Goal 5 (Week 2): Patient will demonstrate basic problem solving during funcitonal tasks with Mod assist question cues.   SLP Short Term Goal 6 (Week 2): Patient will verbally identify 2 physical and 1 cognitive deficits with Mod question cues.   Skilled Therapeutic Interventions: Skilled treatment session focused on cognitive goals. SLP facilitated session by re-administering the MoCA (version 7.3). Patient scored 21/30 points with a score of 26 or above considered normal. Patient continues to demonstrate deficits in the areas of attention, short-term recall and executive functioning. Patient also recalled events from morning therapy sessions with supervision verbal cues. Overall, patient is making significant cognitive gains but continues to demonstrate mild impairments that can impact her safety with functional tasks. Patient handed off to PT. Continue with current plan of care.     Function:   Cognition Comprehension Comprehension assist level: Understands complex 90% of the time/cues 10% of the time  Expression   Expression assist level: Expresses  complex ideas: With extra time/assistive device  Social Interaction Social Interaction assist level: Interacts appropriately with others with medication or extra time (anti-anxiety, antidepressant).  Problem Solving Problem solving assist level: Solves complex 90% of the time/cues < 10% of the time  Memory Memory assist level: Recognizes or recalls 90% of the time/requires cueing < 10% of the time    Pain Pain Assessment Pain Assessment: No/denies pain  Therapy/Group: Individual Therapy  Correy Weidner 08/12/2016, 2:41 PM

## 2016-08-12 NOTE — Progress Notes (Signed)
Speech Language Pathology Daily Session Note  Patient Details  Name: Katrina Donaldson MRN: [id number] Date of Birth: 1956-06-08  Today's Date: 08/12/2016 SLP Individual Time: 1100-1130 SLP Individual Time Calculation (min): 30 min  Short Term Goals: Week 2: SLP Short Term Goal 1 (Week 2): Patient will utilize external aids to reorient herself in 3 consecutive sessions with Mod assist question cues  SLP Short Term Goal 2 (Week 2): Patient will verbally respond to questions with no more than 2 reps in 90% of opportunities.  SLP Short Term Goal 3 (Week 2): Patient will demonstrate initiation and cessation of basic tasks with increased wait time and Mod assist verbal cues to self-monitor errors.  SLP Short Term Goal 4 (Week 2): Patient will sustained attention to task 5 minute increments with Mod assist verbal cues for redirection.  SLP Short Term Goal 5 (Week 2): Patient will demonstrate basic problem solving during funcitonal tasks with Mod assist question cues.   SLP Short Term Goal 6 (Week 2): Patient will verbally identify 2 physical and 1 cognitive deficits with Mod question cues.   Skilled Therapeutic Intervention:  Skilled treatment session focused on addressing cognition goals. Patient upright in chair and eager to participate today.  She re-oriented self to minor date error with Supervision, question cues to utilize external aid.  Patient with timely verbal responses throughout session and required Supervision level question cues to initiate functional problem solving task by doing not just talking SLP through it.  Additionally, patient selected attention to task for ~20 minutes with no cues for redirection in a mildly distracting environment.  Patient required Supervision level question cues to recognize and correct errors during a deductive reasoning task.  Continue with current plan of care.       Function:  Cognition Comprehension Comprehension assist level: Understands complex 90%  of the time/cues 10% of the time  Expression   Expression assist level: Expresses complex ideas: With extra time/assistive device  Social Interaction Social Interaction assist level: Interacts appropriately with others with medication or extra time (anti-anxiety, antidepressant).  Problem Solving Problem solving assist level: Solves complex 90% of the time/cues < 10% of the time  Memory Memory assist level: Recognizes or recalls 90% of the time/requires cueing < 10% of the time    Pain Pain Assessment Pain Assessment: No/denies pain  Therapy/Group: Individual Therapy  Shantae Vantol 08/12/2016, 11:42 AM

## 2016-08-13 ENCOUNTER — Inpatient Hospital Stay (HOSPITAL_COMMUNITY): Payer: BC Managed Care – PPO | Admitting: Speech Pathology

## 2016-08-13 ENCOUNTER — Inpatient Hospital Stay (HOSPITAL_COMMUNITY): Payer: BC Managed Care – PPO

## 2016-08-13 ENCOUNTER — Inpatient Hospital Stay (HOSPITAL_COMMUNITY): Payer: BC Managed Care – PPO | Admitting: Physical Therapy

## 2016-08-13 LAB — URINALYSIS, ROUTINE W REFLEX MICROSCOPIC
Bilirubin Urine: NEGATIVE
Glucose, UA: NEGATIVE mg/dL
Hgb urine dipstick: NEGATIVE
Ketones, ur: NEGATIVE mg/dL
Leukocytes, UA: NEGATIVE
Nitrite: NEGATIVE
Protein, ur: NEGATIVE mg/dL
Specific Gravity, Urine: 1.015 (ref 1.005–1.030)
pH: 5 (ref 5.0–8.0)

## 2016-08-13 NOTE — Progress Notes (Signed)
Speech Language Pathology Weekly Progress and Session Note  Patient Details  Name: Katrina Donaldson MRN: [id number] Date of Birth: 07-30-1955  Beginning of progress report period: August 06, 2016 End of progress report period: August 13, 2016  Today's Date: 08/13/2016 SLP Individual Time: 0935-1000 SLP Individual Time Calculation (min): 25 min  Short Term Goals: Week 2: SLP Short Term Goal 1 (Week 2): Patient will utilize external aids to reorient herself in 3 consecutive sessions with Mod assist question cues  SLP Short Term Goal 1 - Progress (Week 2): Met SLP Short Term Goal 2 (Week 2): Patient will verbally respond to questions with no more than 2 reps in 90% of opportunities.  SLP Short Term Goal 2 - Progress (Week 2): Met SLP Short Term Goal 3 (Week 2): Patient will demonstrate initiation and cessation of basic tasks with increased wait time and Mod assist verbal cues to self-monitor errors.  SLP Short Term Goal 3 - Progress (Week 2): Met SLP Short Term Goal 4 (Week 2): Patient will sustained attention to task 5 minute increments with Mod assist verbal cues for redirection.  SLP Short Term Goal 4 - Progress (Week 2): Met SLP Short Term Goal 5 (Week 2): Patient will demonstrate basic problem solving during funcitonal tasks with Mod assist question cues.   SLP Short Term Goal 5 - Progress (Week 2): Met SLP Short Term Goal 6 (Week 2): Patient will verbally identify 2 physical and 1 cognitive deficits with Mod question cues.  SLP Short Term Goal 6 - Progress (Week 2): Met    New Short Term Goals: Week 3: SLP Short Term Goal 1 (Week 3): Patient will recall new, daily information with supervision verbal cues.  SLP Short Term Goal 2 (Week 3): Patient will demonstrate functional problem solving for mildly complex tasks with supervision verbal cues.  SLP Short Term Goal 3 (Week 3): Patient will demosntrate selective attention to a functional task in a moderately distracting enviornment for  30 minutes with supervision verbal cues.   Weekly Progress Updates: Patient has made excellent gains and has met 6 of 6 STG's this reporting period. Currently, patient requires overall Min A to complete functional and familiar tasks safely in regards to problem solving, recall and selective attention. Patient and family education is ongoing. Patient would benefit from continued skilled SLP intervention to maximize her cognitive function and overall functional independence prior to discharge home.      Intensity: Minumum of 1-2 x/day, 30 to 90 minutes Frequency: 3 to 5 out of 7 days Duration/Length of Stay: 2/21 Treatment/Interventions: Cognitive remediation/compensation;Cueing hierarchy;Environmental controls;Functional tasks;Internal/external aids;Patient/family education;Therapeutic Activities   Daily Session  Skilled Therapeutic Interventions:  Skilled treatment session focused on cognitive goals. SLP facilitated session by providing Min A verbal cues for patient to utilize the memory compensatory strategy of association to maximize recall of information during a novel memory task. Patient demonstrated selective attention to task in a mildly distracting environment for 20 minutes with Min A verbal cues for redirection. Patient also recalled events from previous therapy sessions with supervision verbal cues. Patient left upright in wheelchair with all needs within reach. Continue with current plan of care.        Function:   Cognition Comprehension Comprehension assist level: Understands complex 90% of the time/cues 10% of the time  Expression   Expression assist level: Expresses complex ideas: With extra time/assistive device  Social Interaction Social Interaction assist level: Interacts appropriately with others with medication or extra time (anti-anxiety, antidepressant).  Problem Solving Problem solving assist level: Solves complex 90% of the time/cues < 10% of the time  Memory Memory  assist level: Recognizes or recalls 90% of the time/requires cueing < 10% of the time   Pain No/Denies Pain   Therapy/Group: Individual Therapy  Makale Pindell 08/13/2016, 3:44 PM

## 2016-08-13 NOTE — Progress Notes (Signed)
Physical Therapy Session Note  Patient Details  Name: Katrina Donaldson MRN: [id number] Date of Birth: 1955-11-18  Today's Date: 08/13/2016 PT Individual Time: 1330-1430 PT Individual Time Calculation (min): 60 min   Short Term Goals: Week 3:  PT Short Term Goal 1 (Week 3): Pt will be able to complete squat pivot transfer with mod assist (with full clearance of bottom) PT Short Term Goal 2 (Week 3): Pt will be able to tolerate dynamic standing balance for functional task x 1 min with mod assist PT Short Term Goal 3 (Week 3): Pt will be able to perform car transfer with mod assist  Skilled Therapeutic Interventions/Progress Updates: Pt presented in w/c agreeable to therapy.  Propelled room to rehab gym requiring minA for negotiating objects and turns, required x2 rest breaks due to fatigue. Performed squat pivot transfer to NuStep with min guard, performed NuStep L2 x 10 minutes for LE strengthening and endurance. Trial standing tolerance at parallel bars x 3 trials. Cues for pushing through legs to achieve full extension. Pt requiring modA then maxA sit to stand due to fatigue. Pt returned to room at end of session and left in w/c with call bell within reach and all current needs met.      Therapy Documentation Precautions:  Precautions Precautions: Fall, Back Precaution Comments: recalled 2/3 back precautions Restrictions Weight Bearing Restrictions: No General:   Vital Signs: Therapy Vitals Temp: 98.6 F (37 C) Temp Source: Oral Pulse Rate: 60 Resp: 18 BP: 139/82 Patient Position (if appropriate): Sitting Oxygen Therapy SpO2: 100 % O2 Device: Not Delivered  See Function Navigator for Current Functional Status.   Therapy/Group: Individual Therapy  Kerstie Agent  Gudelia Eugene, PTA  08/13/2016, 4:01 PM

## 2016-08-13 NOTE — Progress Notes (Signed)
Occupational Therapy Session Note  Patient Details  Name: Katrina Donaldson MRN: [id number] Date of Birth: 08/10/1955  Today's Date: 08/13/2016 OT Individual Time:  10:45- 11:55   70 min   Short Term Goals: Week 2:  OT Short Term Goal 1 (Week 2): Pt will sit to stand with MOD A in prep for clothing management. OT Short Term Goal 2 (Week 2): Pt will thread pants over feet with SET UP. OT Short Term Goal 3 (Week 2): Pt will bathe seated to was 10/10 body parts with MIN A and AE PRN.   Skilled Therapeutic Interventions/Progress Updates:    Upon arrival, Pt seated in w/c with pain reported after activity at 3/10, and pt agreeable to tx. Pt refused showering, but wanted to groom at sink level. Discussed d/c planning and showering options at tub level and pt request to practice tub transfer at another date. Pt req increased time to groom at sink with SET UP. Pt reports voiding urine ealier in the day and agreeable to practice transfer to [facility]. Pt squat pivot transfer w/c>BSC with CGA and VC for safety awareness and trunk flexion. Pt stands in steady and advances pants down hips and completes 2 STS from [facility] surface with CGA and VC for upright posture/hip extension. Pt req increased time and rest breaks throughout toileting simulation. Pt req A to advance pants up hips while standing in stedy. Pt dons shoes with shoe funnel and reacher requiring A to steady shoe while placing toes into shoe. Pt propels w/c with BUE/LE to day room with increased time to increase functional endurance with VC to avoid obstacles on R side. While in dayroom pt completes 2 STS from w/c with walker with MIN A-CGA for standing 75 seconds per trial to improve standing endurance in prep for ADLs. Pt returned to room in w/c wth call light in place and all needs met.  Therapy Documentation Precautions:  Precautions Precautions: Fall, Back Precaution Comments: recalled 2/3 back precautions Restrictions Weight Bearing Restrictions:  No General: General PT Missed Treatment Reason: Other (Comment) (breakfast) Vital Signs:   Pain:   with movement, pt reports pain at 3/10. Pt denies wanting medication from nurse ADL: ADL ADL Comments: refer to functional navigator Exercises:   Other Treatments:    See Function Navigator for Current Functional Status.   Therapy/Group: Individual Therapy  Tonny Branch 08/13/2016, 12:16 PM

## 2016-08-13 NOTE — Progress Notes (Signed)
[facility] PHYSICAL MEDICINE & REHABILITATION     PROGRESS NOTE  Subjective/Complaints:  Pt seen laying in bed this AM.  She is excited about the fact that she felt bladder fullness and was able to urinate.  She is quicker to respond this AM.   ROS: Denies nausea, vomiting, diarrhea, shortness of breath or chest pain  Objective: Vital Signs: Blood pressure 140/78, pulse 65, temperature 98.5 F (36.9 C), temperature source Oral, resp. rate 18, height 5\' 5"  (1.651 m), weight 86 kg (189 lb 9.5 oz), SpO2 100 %. No results found. No results for input(s): WBC, HGB, HCT, PLT in the last 72 hours. No results for input(s): NA, K, CL, GLUCOSE, BUN, CREATININE, CALCIUM in the last 72 hours.  Invalid input(s): CO CBG (last 3)  No results for input(s): GLUCAP in the last 72 hours.  Wt Readings from Last 3 Encounters:  07/24/16 86 kg (189 lb 9.5 oz)  07/19/16 90.8 kg (200 lb 2.8 oz)  07/21/15 85.7 kg (189 lb)    Physical Exam:  BP 140/78 (BP Location: Right Arm)   Pulse 65   Temp 98.5 F (36.9 C) (Oral)   Resp 18   Ht 5\' 5"  (1.651 m)   Wt 86 kg (189 lb 9.5 oz)   SpO2 100%   BMI 31.55 kg/m  Constitutional: She appears well-developed. NAD. Obese  HENT: Normocephalic and atraumatic.  Eyes: EOM are normal. No discharge. Cardiovascular: RRR. No JVD. Respiratory: Clear bilaterally. Unlabored.  GI: Soft. Bowel sounds are normal.   Musculoskeletal: She exhibits no tenderness or edema.  Neurological: She is alert and oriented x3, quicker today. Follows full commands.  Motor: LUE: 4+/5 proximal to distal LLE: 4/5 HF,KE, 4+/5 ADF/PF RUE: 4+/5 proximal to distal RLE: HF 3/5, KE 2+/5, ADF/PF 4/5 (unchanged).  Skin: Skin is warm and dry.  Back incision c/d/i. Psychiatric: She has a normal mood and affect.   Assessment/Plan: 1. Functional deficits secondary to myelopathy which require 3+ hours per day of interdisciplinary therapy in a comprehensive inpatient rehab setting. Physiatrist is  providing close team supervision and 24 hour management of active medical problems listed below. Physiatrist and rehab team continue to assess barriers to discharge/monitor patient progress toward functional and medical goals.  Function:  Bathing Bathing position Bathing activity did not occur: Refused Position: Wheelchair/chair at sink  Bathing parts Body parts bathed by patient: Right arm, Left arm, Chest, Abdomen Body parts bathed by helper: Buttocks  Bathing assist Assist Level: Supervision or verbal cues      Upper Body Dressing/Undressing Upper body dressing   What is the patient wearing?: Pull over shirt/dress     Pull over shirt/dress - Perfomed by patient: Thread/unthread right sleeve, Thread/unthread left sleeve, Put head through opening, Pull shirt over trunk Pull over shirt/dress - Perfomed by helper: Pull shirt over trunk        Upper body assist Assist Level: Supervision or verbal cues, Set up   Set up : To obtain clothing/put away  Lower Body Dressing/Undressing Lower body dressing   What is the patient wearing?: Pants, [employer], Shoes   Underwear - Performed by helper: Thread/unthread right underwear leg, Thread/unthread left underwear leg, Pull underwear up/down Pants- Performed by patient: Thread/unthread right pants leg, Thread/unthread left pants leg Pants- Performed by helper: Pull pants up/down   Non-skid slipper socks- Performed by helper: Don/doff right sock, Don/doff left sock Socks - Performed by patient: Don/doff right sock, Don/doff left sock Socks - Performed by helper: Don/doff  left sock, Don/doff right sock Shoes - Performed by patient: Don/doff right shoe Shoes - Performed by helper: Don/doff left shoe, Fasten right, Fasten left       TED Hose - Performed by helper: Don/doff left TED hose, Don/doff right TED hose  Lower body assist Assist for lower body dressing:  (modA) Assistive Device Comment: shoe funnel, reacher, sock aid     Toileting Toileting Toileting activity did not occur:  (pt not voiding, doing scans & caths q 8 hours)   Toileting steps completed by helper: Adjust clothing prior to toileting, Performs perineal hygiene, Adjust clothing after toileting Toileting Assistive Devices: Grab bar or rail  Toileting assist Assist level: Two helpers (stedy)   Transfers Chair/bed transfer Chair/bed transfer activity did not occur: Safety/medical concerns Chair/bed transfer method: Squat pivot Chair/bed transfer assist level: Touching or steadying assistance (Pt > 75%) Chair/bed transfer assistive device: Armrests Mechanical lift: Stedy   Locomotion Ambulation Ambulation activity did not occur: Safety/medical concerns   Max distance: 12 Assist level: 2 helpers (+1 for chair follow )   Wheelchair   Type: Manual Max wheelchair distance: 200 Assist Level: Supervision or verbal cues  Cognition Comprehension Comprehension assist level: Understands complex 90% of the time/cues 10% of the time  Expression Expression assist level: Expresses complex ideas: With extra time/assistive device  Social Interaction Social Interaction assist level: Interacts appropriately with others with medication or extra time (anti-anxiety, antidepressant).  Problem Solving Problem solving assist level: Solves complex 90% of the time/cues < 10% of the time  Memory Memory assist level: Recognizes or recalls 90% of the time/requires cueing < 10% of the time    Medical Problem List and Plan: 1.  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 No brace required.   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    Per Neurosurg, repeat scan in 3 months.    Improving 2.  DVT Prophylaxis/Anticoagulation:   Vascular study showing extensive DVT, including right iliac, but IVC appears to be clean  Continue Xarelto  3. Pain Management: Robaxin and oxycodone as needed.  4. Mood: Paxil 20 mg daily, Provide emotional support 5. Neuropsych: This patient is capable of making decisions on her own behalf. 6. Skin/Wound Care: Routine skin checks 7. Fluids/Electrolytes/Nutrition: Routine I&Os  Remeron started 1/29,  Eating baseline  appreciate RD assistance 8. Hypertension. Norvasc 10 mg daily, Lopressor 100 mg twice a day.   Overall controlled 2/13 9. 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 10. History hypercoagulable state. INR 1.09. 11. Constipation. Laxative assistive 12. Hypokalemia: Improved  K+ 4.4 on 2/9 13. Leukocytosis: Resolved  Afebrile  Improving 14. ABLA  Hb 9.7 on 2/9   FeSO4 and Vit C started 2/9  Cont to monitor   LOS (Days) 20 A FACE TO FACE EVALUATION WAS PERFORMED  Macenzie Burford Lorie Phenix 08/13/2016 11:04 AM

## 2016-08-13 NOTE — Progress Notes (Signed)
Physical Therapy Session Note  Patient Details  Name: Katrina Donaldson MRN: [id number] Date of Birth: [birth year]  Today's Date: 08/13/2016 PT Individual Time: 0815-0900 PT Individual Time Calculation (min): 45 min   Short Term Goals: Week 3:  PT Short Term Goal 1 (Week 3): Pt will be able to complete squat pivot transfer with mod assist (with full clearance of bottom) PT Short Term Goal 2 (Week 3): Pt will be able to tolerate dynamic standing balance for functional task x 1 min with mod assist PT Short Term Goal 3 (Week 3): Pt will be able to perform car transfer with mod assist  Skilled Therapeutic Interventions/Progress Updates: Pt received seated in bed, denies pain and agreeable to treatment. Pt c/o swelling in RLE, states she wears compression stockings at home. Cleared with RN to don TEDs; donned totalA. Supine>sit on EOB with HOB elevated and bedrails. Pt threads legs of pants on EOB with reacher and increased time, shoes donned with minA for pushing heel down and use of AE. Pt attempted to thread both pants and first shoe into wrong foot; required cues to correct. Sit <>stand x2 trials from EOB with maxA, facilitation at R knee to prevent buckling. Standing balance with min guard at R knee and heavy reliance of LEs on bed while therapist pulled up pants totalA. Squat pivot transfer bed>w/c minA. Pt setup with breakfast as she reports she would like to eat her food while it is still warm; missed 15 min of therapy. W/c propulsion x200' on level surface, tile and carpet with BUE and supervision for UE strengthening and endurance. Pt additionally navigated through doors with increased time and min cues for problem solving. Remained seated in w/c at end of session, all needs in reach.      Therapy Documentation Precautions:  Precautions Precautions: Fall, Back Precaution Comments: recalled 2/3 back precautions Restrictions Weight Bearing Restrictions: No General: PT Amount of Missed Time  (min): 15 Minutes PT Missed Treatment Reason: Other (Comment) (breakfast)   See Function Navigator for Current Functional Status.   Therapy/Group: Individual Therapy  Luberta Mutter 08/13/2016, 8:52 AM

## 2016-08-14 ENCOUNTER — Inpatient Hospital Stay (HOSPITAL_COMMUNITY): Payer: BC Managed Care – PPO

## 2016-08-14 ENCOUNTER — Inpatient Hospital Stay (HOSPITAL_COMMUNITY): Payer: BC Managed Care – PPO | Admitting: Occupational Therapy

## 2016-08-14 ENCOUNTER — Inpatient Hospital Stay (HOSPITAL_COMMUNITY): Payer: BC Managed Care – PPO | Admitting: Physical Therapy

## 2016-08-14 ENCOUNTER — Inpatient Hospital Stay (HOSPITAL_COMMUNITY): Payer: BC Managed Care – PPO | Admitting: Speech Pathology

## 2016-08-14 DIAGNOSIS — N390 Urinary tract infection, site not specified: Secondary | ICD-10-CM

## 2016-08-14 MED ORDER — NITROFURANTOIN MONOHYD MACRO 100 MG PO CAPS
100.0000 mg | ORAL_CAPSULE | Freq: Two times a day (BID) | ORAL | Status: AC
Start: 2016-08-14 — End: 2016-08-20
  Administered 2016-08-14 – 2016-08-20 (×14): 100 mg via ORAL
  Filled 2016-08-14 (×14): qty 1

## 2016-08-14 NOTE — Progress Notes (Signed)
[place] PHYSICAL MEDICINE & REHABILITATION     PROGRESS NOTE  Subjective/Complaints:  Pt seen laying in bed this AM. She states she slept well overnight. She complains of achy right knee.   ROS: +Right knee pain. Denies nausea, vomiting, diarrhea, shortness of breath or chest pain  Objective: Vital Signs: Blood pressure 116/62, pulse 62, temperature 98.2 F (36.8 C), temperature source Oral, resp. rate 18, height 5\' 5"  (1.651 m), weight 86 kg (189 lb 9.5 oz), SpO2 99 %. No results found. No results for input(s): WBC, HGB, HCT, PLT in the last 72 hours. No results for input(s): NA, K, CL, GLUCOSE, BUN, CREATININE, CALCIUM in the last 72 hours.  Invalid input(s): CO CBG (last 3)  No results for input(s): GLUCAP in the last 72 hours.  Wt Readings from Last 3 Encounters:  07/24/16 86 kg (189 lb 9.5 oz)  07/19/16 90.8 kg (200 lb 2.8 oz)  07/21/15 85.7 kg (189 lb)    Physical Exam:  BP 116/62 (BP Location: Right Arm)   Pulse 62   Temp 98.2 F (36.8 C) (Oral)   Resp 18   Ht 5\' 5"  (1.651 m)   Wt 86 kg (189 lb 9.5 oz)   SpO2 99%   BMI 31.55 kg/m  Constitutional: She appears well-developed. NAD. Obese  HENT: Normocephalic and atraumatic.  Eyes: EOM are normal. No discharge. Cardiovascular: RRR. No JVD. Respiratory: Clear bilaterally. Unlabored.  GI: Soft. Bowel sounds are normal.   Musculoskeletal: She exhibits no tenderness or edema.  Neurological: She is alert and oriented x3. Follows full commands.  Motor: LUE: 4+/5 proximal to distal LLE: 4/5 HF,KE, 4+/5 ADF/PF RUE: 4+/5 proximal to distal RLE: HF 4/5, KE 2+/5, ADF/PF 3/5 (unchanged).  Skin: Skin is warm and dry.  Back incision c/d/i. Psychiatric: She has a normal mood and affect.   Assessment/Plan: 1. Functional deficits secondary to myelopathy which require 3+ hours per day of interdisciplinary therapy in a comprehensive inpatient rehab setting. Physiatrist is providing close team supervision and 24 hour  management of active medical problems listed below. Physiatrist and rehab team continue to assess barriers to discharge/monitor patient progress toward functional and medical goals.  Function:  Bathing Bathing position Bathing activity did not occur: Refused Position: Wheelchair/chair at sink  Bathing parts Body parts bathed by patient: Right arm, Left arm, Chest, Abdomen Body parts bathed by helper: Buttocks  Bathing assist Assist Level: Supervision or verbal cues      Upper Body Dressing/Undressing Upper body dressing   What is the patient wearing?: Pull over shirt/dress     Pull over shirt/dress - Perfomed by patient: Thread/unthread right sleeve, Thread/unthread left sleeve, Put head through opening, Pull shirt over trunk Pull over shirt/dress - Perfomed by helper: Pull shirt over trunk        Upper body assist Assist Level: Supervision or verbal cues, Set up   Set up : To obtain clothing/put away  Lower Body Dressing/Undressing Lower body dressing   What is the patient wearing?: Pants, [employer], Shoes   Underwear - Performed by helper: Thread/unthread right underwear leg, Thread/unthread left underwear leg, Pull underwear up/down Pants- Performed by patient: Thread/unthread right pants leg, Thread/unthread left pants leg Pants- Performed by helper: Pull pants up/down   Non-skid slipper socks- Performed by helper: Don/doff right sock, Don/doff left sock Socks - Performed by patient: Don/doff right sock, Don/doff left sock Socks - Performed by helper: Don/doff left sock, Don/doff right sock Shoes - Performed by patient: Don/doff right  shoe Shoes - Performed by helper: Don/doff left shoe       TED Hose - Performed by helper: Don/doff left TED hose, Don/doff right TED hose  Lower body assist Assist for lower body dressing:  (mod A) Assistive Device Comment: Tour manager activity did not occur:  (pt not voiding, doing scans & caths q 8  hours)   Toileting steps completed by helper: Adjust clothing prior to toileting, Performs perineal hygiene, Adjust clothing after toileting Toileting Assistive Devices: Grab bar or rail  Toileting assist Assist level: Two helpers (stedy)   Transfers Chair/bed transfer Chair/bed transfer activity did not occur: Safety/medical concerns Chair/bed transfer method: Squat pivot Chair/bed transfer assist level: Moderate assist (Pt 50 - 74%/lift or lower) Chair/bed transfer assistive device: Armrests Mechanical lift: Stedy   Locomotion Ambulation Ambulation activity did not occur: Safety/medical concerns   Max distance: 12 Assist level: 2 helpers (+1 for chair follow )   Wheelchair   Type: Manual Max wheelchair distance: 200 Assist Level: Supervision or verbal cues  Cognition Comprehension Comprehension assist level: Understands complex 90% of the time/cues 10% of the time  Expression Expression assist level: Expresses complex ideas: With extra time/assistive device  Social Interaction Social Interaction assist level: Interacts appropriately with others with medication or extra time (anti-anxiety, antidepressant).  Problem Solving Problem solving assist level: Solves complex 90% of the time/cues < 10% of the time  Memory Memory assist level: Recognizes or recalls 90% of the time/requires cueing < 10% of the time    Medical Problem List and Plan: 1.  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 No brace required.   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    Per Neurosurg, repeat scan in 3 months.   Making progress 2.  DVT Prophylaxis/Anticoagulation:   Vascular study showing extensive DVT,  including right iliac, but IVC appears to be clean  Continue Xarelto  3. Pain Management: Robaxin and oxycodone as needed.  4. Mood: Paxil 20 mg daily, Provide emotional support 5. Neuropsych: This patient is capable of making decisions on her own behalf. 6. Skin/Wound Care: Routine skin checks 7. Fluids/Electrolytes/Nutrition: Routine I&Os  Remeron started 1/29,  Eating baseline  appreciate RD assistance 8. Hypertension. Norvasc 10 mg daily, Lopressor 100 mg twice a day.   Overall controlled 2/14 9. 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 10. History hypercoagulable state. INR 1.09. 11. Constipation. Laxative assistive 12. Hypokalemia: Improved  K+ 4.4 on 2/9 13. Leukocytosis: Resolved  Afebrile  Improving 14. ABLA  Hb 9.7 on 2/9   FeSO4 and Vit C started 2/9  Cont to monitor 15. Acute lower UTI, E.Coli  Sensitivities pending  Empiric macrobid started 2/14-2/20   LOS (Days) 21 A FACE TO FACE EVALUATION WAS PERFORMED  Calab Sachse Lorie Phenix 08/14/2016 11:21 AM

## 2016-08-14 NOTE — Progress Notes (Signed)
Occupational Therapy Session Note  Patient Details  Name: Katrina Donaldson MRN: [id number] Date of Birth: 1955/07/26  Today's Date: 08/14/2016 OT Individual Time: 9022-8406 OT Individual Time Calculation (min): 62 min    Short Term Goals: Week 2:  OT Short Term Goal 1 (Week 2): Pt will sit to stand with MOD A in prep for clothing management. OT Short Term Goal 2 (Week 2): Pt will thread pants over feet with SET UP. OT Short Term Goal 3 (Week 2): Pt will bathe seated to was 10/10 body parts with MIN A and AE PRN.   Skilled Therapeutic Interventions/Progress Updates:    Upon arrival, pt seen seated in w/c, reporting no pain and agreeable to tx. Throughout session pt squat ivot transfer w/c to mat/bed with CGA and VC for trunk flexion. While seated unsupported on mat and looking into mirror for visual feedback, Pt don/doff socks with reacher with supervision and VC for tool use. Pt takes seated rest break in w/c to rest back. Pt dons shoes with reacher and shoe funnel with A to stabilize L shoe. Pt internally rotates heel while attempting to push heel into funnel. OT provides manual facilitation of heel into shoe funnel. In unsupported EOMt, Pt addresses, assembles and seals valentines with VC for sequencing activity. Pt STS x2  EOM with RW with MAX A for lift and lower and VC for upright posture. Standing trials lasted ~25 seconds. Pt returned to bed at end of session with call light in place and all needs met.      Therapy Documentation Precautions:  Precautions Precautions: Fall, Back Precaution Comments: recalled 2/3 back precautions Restrictions Weight Bearing Restrictions: No General:   Vital Signs: Therapy Vitals Temp: 98.3 F (36.8 C) Temp Source: Oral Pulse Rate: (!) 55 (RN notified) Resp: 18 BP: 108/67 Patient Position (if appropriate): Sitting Oxygen Therapy SpO2: 100 % O2 Device: Not Delivered Pain:   ADL: ADL ADL Comments: refer to functional navigator Exercises:   Other Treatments:    See Function Navigator for Current Functional Status.   Therapy/Group: Individual Therapy  Tonny Branch 08/14/2016, 4:06 PM

## 2016-08-14 NOTE — Patient Care Conference (Signed)
Inpatient RehabilitationTeam Conference and Plan of Care Update Date: 08/14/2016   Time: 11:30 AM    Patient Name: Katrina Donaldson      Medical Record Number: [id number]  Date of Birth: May 04, 1956 Sex: Female         Room/Bed: 4W25C/4W25C-01 Payor Info: Payor: [place] / Plan: [employer] [facility] PPO / Product Type: *No Product type* /    Admitting Diagnosis: Spinal Cyst  Admit Date/Time:  07/24/2016  6:34 PM Admission Comments: No comment available   Primary Diagnosis:  <principal problem not specified> Principal Problem: <principal problem not specified>  Patient Active Problem List   Diagnosis Date Noted  . Acute lower UTI   . Weakness of right lower extremity   . Confusion   . Hydrocephalus   . Poor appetite   . Acute blood loss anemia   . Post-operative pain   . Hypokalemia   . Leukocytosis   . Thrombocytopenia ([place])   . Acute deep vein thrombosis (DVT) of femoral vein of left lower extremity ([place])   . Myelopathy ([place]) 07/24/2016  . Thoracic myelopathy   . Depression   . Benign essential HTN   . History of subarachnoid hemorrhage   . Hypercoagulable state ([place])   . Constipation due to pain medication   . 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 Protein-calorie malnutrition ([place]) 12/21/2014  . Thyroid activity decreased 12/21/2014  . 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 Essential hypertension 12/13/2014  . Anemia, iron deficiency 12/13/2014  . Hypothyroidism 12/13/2014  . Prediabetes 12/13/2014    Expected Discharge Date: Expected Discharge Date: 08/21/16  Team Members Present: Physician leading conference: Dr. 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   Improve mobility, trasfers, UTI  See above   Bowel/Bladder   Continent of bowel, LBM 08/11/16, In & out cath q 8hrs w/ no void, was incontinent of bladder times 1 yesterday  continue catheterizations until able to void, max assist  continued trials of voiding, PVR, catheterizatiins   Swallow/Nutrition/ Hydration             ADL's   min A bed mobility, mod A squat pivot transfer, min - mod standing balance, UB self care set up A, LB self care mod A with use of AE  min A overall, supervision/set up for UB dressing  self care retraining with AE, balance, functional transfers, precautions, pt education   Mobility   minA bed mobility, maxA sit/stand, minA squat pivot transfer, gait 25f maxA  MinA functional mobilty   functional transfers, standing tolerance, LE strengthening    Communication   Supervision-Mod I   Supervision  Goals Met   Safety/Cognition/ Behavioral Observations  Min A  Min A  recall with use of strategies, complex problem solving    Pain   no c/o pain since 2/11, has tylenol, percocet & robaxin ordered prn  pain scale less than 4  continue to assess & treat as needed   Skin   steristrips to back incision, approximated & dry, itchy, sacral drsg on, areas underneath healed  no new  areas of skin breakdown  assess skin q shift      *See Care Plan and progress notes for long and short-term goals.  Barriers to Discharge: mobility, DVT, UTI    Possible Resolutions to Barriers:  Therapies, DVT treatment, abx    Discharge Planning/Teaching Needs:  Need to follow up with daughter about d/c plans either locally or in [place]  TBD   Team Discussion:  Medically improved;  UA sent 2/12;  Stage 2 on bottom is healing.  MUCH BETTER this week and more engaged!  Min - mod assist w/c  level overall but actually took a few steps today (12 steps with max assist).  On track to meet min assist goals.  Good cognitive gains this week as well.  SW following up with daughter to confirm plans.  Revisions to Treatment Plan:  None   Continued Need for Acute Rehabilitation Level of Care: The patient requires daily medical management by a physician with specialized training in physical medicine and rehabilitation for the following conditions: Daily direction of a multidisciplinary physical rehabilitation program to ensure safe treatment while eliciting the highest outcome that is of practical value to the patient.: Yes Daily medical management of patient stability for increased activity during participation in an intensive rehabilitation regime.: Yes Daily analysis of laboratory values and/or radiology reports with any subsequent need for medication adjustment of medical intervention for : Neurological problems;Diabetes problems;Urological problems  Katrina Donaldson 08/14/2016, 2:27 PM

## 2016-08-14 NOTE — Progress Notes (Signed)
Occupational Therapy Session Note  Patient Details  Name: Katrina Donaldson MRN: [id number] Date of Birth: [birth year]  Today's Date: 08/14/2016 OT Individual Time: [id number] OT Individual Time Calculation (min): 44 min    Short Term Goals: Week 2:  OT Short Term Goal 1 (Week 2): Pt will sit to stand with MOD A in prep for clothing management. OT Short Term Goal 2 (Week 2): Pt will thread pants over feet with SET UP. OT Short Term Goal 3 (Week 2): Pt will bathe seated to was 10/10 body parts with MIN A and AE PRN.    Skilled Therapeutic Interventions/Progress Updates:    Upon entering the room, pt supine in bed with no c/o pain this session and motivated for OT intervention. Pt performing log roll to sit onto EOB with steady assistance. Pt declined bathing but requesting to change clothing. UB self care with set up A. Pt donned pants with use of reacher to thread clothing and leaning laterally with assistance from therapist to pull over B hips. Pt attempted to stand for stand pivot transfer but unable to stand with RW this session. Pt did stand from bed with therapist in front and max A. Squat pivot transfer from bed >wheelchair with mod A. Pt set up for breakfast tray with call bell and all needed items within reach upon exiting the room.   Therapy Documentation Precautions:  Precautions Precautions: Fall, Back Precaution Comments: recalled 2/3 back precautions Restrictions Weight Bearing Restrictions: No General:   Vital Signs:   Pain:   ADL: ADL ADL Comments: refer to functional navigator Exercises:   Other Treatments:    See Function Navigator for Current Functional Status.   Therapy/Group: Individual Therapy  Gypsy Decant 08/14/2016, 9:28 AM

## 2016-08-14 NOTE — Progress Notes (Signed)
Physical Therapy Session Note  Patient Details  Name: Katrina Donaldson MRN: [id number] Date of Birth: [birth year]  Today's Date: 08/14/2016 PT Individual Time: 1045-1130 PT Individual Time Calculation (min): 45 min   Short Term Goals: Week 3:  PT Short Term Goal 1 (Week 3): Pt will be able to complete squat pivot transfer with mod assist (with full clearance of bottom) PT Short Term Goal 2 (Week 3): Pt will be able to tolerate dynamic standing balance for functional task x 1 min with mod assist PT Short Term Goal 3 (Week 3): Pt will be able to perform car transfer with mod assist  Skilled Therapeutic Interventions/Progress Updates: Pt presented in w/c agreeable to therapy. Performed squat pivot transfer w/c to mat with min guard requiring multiple scoots to complete transfer. Sit to/from stand x 4 with mod/maxA cues for increased quad activation to achieve full ext. Performed gait training with w/c follow +1, maxA with cues for knee extension and erect posture. Pt ambulated 60f before fatigue. Performed squat pivot transfer with min guard to NuStep. Performed L3 LE only x8 min L2 x 2 for LE strengthening and endurance. Cues to avoid R hip ER with activitiy. Pt with improved response with tactile feedback by use of resistance band around upper legs. Return squat pivot transfer to w/c and wheeled back for time management. Pt remained in chair at end of session with call bell within reach and needs met.      Therapy Documentation Precautions:  Precautions Precautions: Fall, Back Precaution Comments: recalled 2/3 back precautions Restrictions Weight Bearing Restrictions: No     See Function Navigator for Current Functional Status.   Therapy/Group: Individual Therapy  Colm Lyford  Joda Braatz, PTA  08/14/2016, 4:16 PM

## 2016-08-14 NOTE — Progress Notes (Signed)
Nutrition Follow-up  DOCUMENTATION CODES:   Obesity unspecified  INTERVENTION:  Continue Ensure Enlive po BID, each supplement provides 350 kcal and 20 grams of protein.  Encourage adequate PO intake.   NUTRITION DIAGNOSIS:   Inadequate oral intake related to  (dislike of food at meals) as evidenced by per patient/family report, meal completion < 50%; improved  GOAL:   Patient will meet greater than or equal to 90% of their needs; met  MONITOR:   PO intake, Supplement acceptance, Labs, Weight trends, Skin, I & O's  REASON FOR ASSESSMENT:   Malnutrition Screening Tool    ASSESSMENT:   61 y.o. right handed female with history of hypertension, hypercoagulable state, [facility] 11/29/2014 requiring surgery in [facility].  Presented 07/19/2016 with gait disorder with decreased balance and some intermittent falls. 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 Underwent T11-L1 laminectomy, T4-T7 laminectomy, fenestration of arachnoid cyst 07/19/2016   Pt reports appetite has improved and she has been getting food choices she likes at meals. Meal completion has been 40-100% with most intake at 100%. Pt currently has Ensure ordered. RD to continue with current orders to aid in adequate nutrition.   Diet Order:  Diet regular Room service appropriate? Yes; Fluid consistency: Thin  Skin:  Reviewed, no issues  Last BM:  2/14  Height:   Ht Readings from Last 1 Encounters:  07/24/16 _0  (1.651 m)    Weight:   Wt Readings from Last 1 Encounters:  07/24/16 189 lb 9.5 oz (86 kg)    Ideal Body Weight:  56.8 kg  BMI:  Body mass index is 31.55 kg/m.  Estimated Nutritional Needs:   Kcal:  1700-1900  Protein:  85-100 grams  Fluid:  1.7 - 1.9 L/day  EDUCATION NEEDS:   No education needs identified at this time  Corrin Parker, MS, RD,  LDN Pager # [phone] After hours/ weekend pager # [phone]

## 2016-08-14 NOTE — Progress Notes (Addendum)
Speech Language Pathology Daily Session Note  Patient Details  Name: Katrina Donaldson MRN: [id number] Date of Birth: 12-27-1955  Today's Date: 08/14/2016 SLP Individual Time: 1000-1030 SLP Individual Time Calculation (min): 30 min  Missed Time: 15 minutes, Toileting, RN care  Short Term Goals: Week 3: SLP Short Term Goal 1 (Week 3): Patient will recall new, daily information with supervision verbal cues.  SLP Short Term Goal 2 (Week 3): Patient will demonstrate functional problem solving for mildly complex tasks with supervision verbal cues.  SLP Short Term Goal 3 (Week 3): Patient will demosntrate selective attention to a functional task in a moderately distracting enviornment for 30 minutes with supervision verbal cues.   Skilled Therapeutic Interventions: Skilled treatment session focused on cognitive goals. SLP facilitated session by providing supervision-Min A verbal cues for utilization of external memory aids to maximize recall of new and functional information. Patient utilized external aid in a mildly distracting environment for ~20 minutes with supervision verbal cues for redirection. Patient left upright in wheelchair with all needs within reach. Continue with current plan of care.      Function:  Cognition Comprehension Comprehension assist level: Understands complex 90% of the time/cues 10% of the time  Expression   Expression assist level: Expresses complex ideas: With extra time/assistive device  Social Interaction Social Interaction assist level: Interacts appropriately with others with medication or extra time (anti-anxiety, antidepressant).  Problem Solving Problem solving assist level: Solves complex 90% of the time/cues < 10% of the time  Memory Memory assist level: Recognizes or recalls 90% of the time/requires cueing < 10% of the time    Pain No/Denies Pain   Therapy/Group: Individual Therapy  Bhavana Kady 08/14/2016, 11:50 AM

## 2016-08-15 ENCOUNTER — Inpatient Hospital Stay (HOSPITAL_COMMUNITY): Payer: BC Managed Care – PPO

## 2016-08-15 ENCOUNTER — Inpatient Hospital Stay (HOSPITAL_COMMUNITY): Payer: BC Managed Care – PPO | Admitting: Speech Pathology

## 2016-08-15 DIAGNOSIS — M25561 Pain in right knee: Secondary | ICD-10-CM

## 2016-08-15 LAB — URINE CULTURE

## 2016-08-15 MED ORDER — LIDOCAINE 5 % EX PTCH
1.0000 | MEDICATED_PATCH | CUTANEOUS | Status: DC
  Administered 2016-08-15 – 2016-08-19 (×5): 1 via TRANSDERMAL
  Filled 2016-08-15 (×4): qty 1

## 2016-08-15 MED ORDER — DICLOFENAC SODIUM 1 % TD GEL
2.0000 g | Freq: Four times a day (QID) | TRANSDERMAL | Status: DC
  Filled 2016-08-15: qty 100

## 2016-08-15 NOTE — Progress Notes (Signed)
Occupational Therapy Session Note  Patient Details  Name: Katrina Donaldson MRN: [id number] Date of Birth: Apr 23, 1956  Today's Date: 08/15/2016 OT Individual Time:  8:58- 9:43 and 14:00-14:30   45 min and 30 min   Short Term Goals: Week 3:     Skilled Therapeutic Interventions/Progress Updates:    Session 1: Pt seen in room seated EOB with PT hand off. No pain reported and focus of session on self care retraining and functional mobility. Pt completes STS from elevated bed level with MAX A for lift and lower in order to have . Pt required VC for safety awareness and blocking of L foot. Pt completes SPT with MAX A from elevated bed >w/c. Pt breakfast try delivered, and pt requests to eat part of meal and is able to manage all containers with SET Up. Pt washes face, puts on deodorant and brushes teeth with w/c set up at sink to facilitate forward trunk flexion. Pt  Squat pivot transfers from w/c>BSC with CGA and stands in the steady with MIN A for balance as OT advances pants up/down hips. Pt unable to void urine during session and returns to w/c with all needs met and call light in place.   Session 2: Direct hand off from PT in day room. Pt requests to continue participating in recreational therapy activities. Pt participates in winter olympic bingo activity with supervision for  [facility] for scanning/identifying correct spaces to cover with tiles. Pt propels w/c around obstacles to obtain snacks and drink from snack table and participates in trivia game, by reading questions to be answered by other participants. Pt returned to room seated in w/c with call light in place and all needs met.   Therapy Documentation Precautions:  Precautions Precautions: Fall, Back Precaution Comments: recalled 2/3 back precautions Restrictions Weight Bearing Restrictions: No General:   Vital Signs: Therapy Vitals Temp: 98.4 F (36.9 C) Temp Source: Oral Pulse Rate: (!) 58 Resp: 16 BP: 118/68 Patient Position  (if appropriate): Lying Oxygen Therapy SpO2: 100 % O2 Device: Not Delivered Pain:   ADL: ADL ADL Comments: refer to functional navigator Exercises:   Other Treatments:    See Function Navigator for Current Functional Status.   Therapy/Group: Individual Therapy  Tonny Branch 08/15/2016, 7:43 AM

## 2016-08-15 NOTE — Progress Notes (Signed)
PHYSICAL MEDICINE & REHABILITATION     PROGRESS NOTE  Subjective/Complaints:  Pt seen laying in bed this AM.  Pt states she had a restless night. Per nursing, pt with persistent right knee pain.     ROS: +Right knee pain. Denies nausea, vomiting, diarrhea, shortness of breath or chest pain  Objective: Vital Signs: Blood pressure 118/68, pulse (!) 58, temperature 98.4 F (36.9 C), temperature source Oral, resp. rate 16, height 5\' 5"  (1.651 m), weight 86 kg (189 lb 9.5 oz), SpO2 100 %. No results found. No results for input(s): WBC, HGB, HCT, PLT in the last 72 hours. No results for input(s): NA, K, CL, GLUCOSE, BUN, CREATININE, CALCIUM in the last 72 hours.  Invalid input(s): CO CBG (last 3)  No results for input(s): GLUCAP in the last 72 hours.  Wt Readings from Last 3 Encounters:  07/24/16 86 kg (189 lb 9.5 oz)  07/19/16 90.8 kg (200 lb 2.8 oz)  07/21/15 85.7 kg (189 lb)    Physical Exam:  BP 118/68 (BP Location: Right Arm)   Pulse (!) 58   Temp 98.4 F (36.9 C) (Oral)   Resp 16   Ht 5\' 5"  (1.651 m)   Wt 86 kg (189 lb 9.5 oz)   SpO2 100%   BMI 31.55 kg/m  Constitutional: She appears well-developed. NAD. Obese  HENT: Normocephalic and atraumatic.  Eyes: EOM are normal. No discharge. Cardiovascular: RRR. No JVD. Respiratory: Clear bilaterally. Unlabored.  GI: Soft. Bowel sounds are normal.   Musculoskeletal: She exhibits no tenderness or edema, including right knee.  Neurological: She is alert and oriented x3. Follows full commands.  Motor: LUE: 4+/5 proximal to distal LLE: 4/5 HF,KE, 4+/5 ADF/PF RUE: 4+/5 proximal to distal RLE: HF 4/5, KE 2+/5, ADF/PF 3/5 (stable).  Skin: Skin is warm and dry.  Back incision c/d/i. Psychiatric: She has a normal mood and affect.   Assessment/Plan: 1. Functional deficits secondary to myelopathy which require 3+ hours per day of interdisciplinary therapy in a comprehensive inpatient rehab setting. Physiatrist is  providing close team supervision and 24 hour management of active medical problems listed below. Physiatrist and rehab team continue to assess barriers to discharge/monitor patient progress toward functional and medical goals.  Function:  Bathing Bathing position Bathing activity did not occur: Refused Position: Wheelchair/chair at sink  Bathing parts Body parts bathed by patient: Right arm, Left arm, Chest, Abdomen Body parts bathed by helper: Buttocks  Bathing assist Assist Level: Supervision or verbal cues      Upper Body Dressing/Undressing Upper body dressing   What is the patient wearing?: Pull over shirt/dress     Pull over shirt/dress - Perfomed by patient: Thread/unthread right sleeve, Thread/unthread left sleeve, Put head through opening, Pull shirt over trunk Pull over shirt/dress - Perfomed by helper: Pull shirt over trunk        Upper body assist Assist Level: Supervision or verbal cues, Set up   Set up : To obtain clothing/put away  Lower Body Dressing/Undressing Lower body dressing   What is the patient wearing?: Pants, [employer], Shoes   Underwear - Performed by helper: Thread/unthread right underwear leg, Thread/unthread left underwear leg, Pull underwear up/down Pants- Performed by patient: Thread/unthread right pants leg, Thread/unthread left pants leg Pants- Performed by helper: Pull pants up/down   Non-skid slipper socks- Performed by helper: Don/doff right sock, Don/doff left sock Socks - Performed by patient: Don/doff right sock, Don/doff left sock Socks - Performed by helper: Don/doff left  sock, Don/doff right sock Shoes - Performed by patient: Don/doff right shoe Shoes - Performed by helper: Don/doff left shoe       TED Hose - Performed by helper: Don/doff left TED hose, Don/doff right TED hose  Lower body assist Assist for lower body dressing:  (mod A) Assistive Device Comment: Tour manager activity did not occur:   (pt not voiding, doing scans & caths q 8 hours)   Toileting steps completed by helper: Adjust clothing prior to toileting, Performs perineal hygiene, Adjust clothing after toileting Toileting Assistive Devices: Grab bar or rail, Other (comment) (Stedy)  Toileting assist Assist level: Two helpers (stedy)   Transfers Chair/bed transfer Chair/bed transfer activity did not occur: Safety/medical concerns Chair/bed transfer method: Squat pivot Chair/bed transfer assist level: Touching or steadying assistance (Pt > 75%) Chair/bed transfer assistive device: Armrests Mechanical lift: Stedy   Locomotion Ambulation Ambulation activity did not occur: Safety/medical concerns   Max distance: 12 Assist level: 2 helpers (+1 for chair follow )   Wheelchair   Type: Manual Max wheelchair distance: 200 Assist Level: Supervision or verbal cues  Cognition Comprehension Comprehension assist level: Understands complex 90% of the time/cues 10% of the time  Expression Expression assist level: Expresses complex 90% of the time/cues < 10% of the time  Social Interaction Social Interaction assist level: Interacts appropriately with others with medication or extra time (anti-anxiety, antidepressant).  Problem Solving Problem solving assist level: Solves complex 90% of the time/cues < 10% of the time  Memory Memory assist level: Recognizes or recalls 90% of the time/requires cueing < 10% of the time    Medical Problem List and Plan: 1.  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 No brace required.   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    Per Neurosurg, repeat scan in 3 months.   Making progress 2.  DVT  Prophylaxis/Anticoagulation:   Vascular study showing extensive DVT, including right iliac, but IVC appears to be clean  Continue Xarelto  3. Pain Management: Robaxin and oxycodone as needed.   Will add lidoderm patch to right knee 4. Mood: Paxil 20 mg daily, Provide emotional support 5. Neuropsych: This patient is capable of making decisions on her own behalf. 6. Skin/Wound Care: Routine skin checks 7. Fluids/Electrolytes/Nutrition: Routine I&Os  Remeron started 1/29,  Eating baseline  appreciate RD assistance 8. Hypertension. Norvasc 10 mg daily, Lopressor 100 mg twice a day.   Controlled 2/15 9. 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 10. History hypercoagulable state. INR 1.09. 11. Constipation. Laxative assistive 12. Hypokalemia: Resolved  K+ 4.4 on 2/9 13. Leukocytosis: Resolved  Afebrile  Improving 14. ABLA  Hb 9.7 on 2/9   FeSO4 and Vit C started 2/9  Labs ordered for tomorrow  Cont to monitor 15. Acute lower UTI, E.Coli  Macrobid started 2/14-2/20   LOS (Days) 22 A FACE TO FACE EVALUATION WAS PERFORMED  Ankit Lorie Phenix 08/15/2016 12:22 PM

## 2016-08-15 NOTE — Progress Notes (Signed)
Physical Therapy Session Note  Patient Details  Name: Katrina Donaldson MRN: [id number] Date of Birth: [birth year]  Today's Date: 08/15/2016 PT Individual Time: 0830-0900 PT Individual Time Calculation (min): 30 min   Short Term Goals: Week 3:  PT Short Term Goal 1 (Week 3): Pt will be able to complete squat pivot transfer with mod assist (with full clearance of bottom) PT Short Term Goal 2 (Week 3): Pt will be able to tolerate dynamic standing balance for functional task x 1 min with mod assist PT Short Term Goal 3 (Week 3): Pt will be able to perform car transfer with mod assist  Skilled Therapeutic Interventions/Progress Updates:   D/c planning and discussion in regards to home set-up at daughters home in [place], if this is where she ends up discharging too. Discussed options for sleeping arrangements and if hospital bed vs regular bed would be beneficial for positioning as pt having difficulty maintaining flat position. Pt able to identify that all other family members homes are a barrier their accessibility via w/c level without cues or prompting by PT demonstrating insight and awareness of deficits. Practiced bed mobility from flat surface but difficulty tolerating this position and pt ended up using rails to get to EOB due to back discomfort to maintain precautions. Initiated donning pants and attempted sit <> stand to pull up pants but unable on first 2 attempts. OT arrived for 9am session and hand off EOB.  Therapy Documentation Precautions:  Precautions Precautions: Fall, Back Precaution Comments: recalled 2/3 back precautions Restrictions Weight Bearing Restrictions: No  Pain:  Denies pain.    See Function Navigator for Current Functional Status.   Therapy/Group: Individual Therapy  Canary Brim Ivory Broad, PT, DPT  08/15/2016, 10:02 AM

## 2016-08-15 NOTE — Progress Notes (Signed)
Physical Therapy Weekly Progress Note  Patient Details  Name: Katrina Donaldson MRN: [id number] Date of Birth: 02-Sep-1955  Beginning of progress report period: August 09, 2016 End of progress report period: August 16, 2016  Today's Date: 08/16/2016  Session #1: PT Individual Time: 9381-0175 PT Individual Time Calculation (min): 25 min  Denies pain just reports legs are heavy today. Pt with flat affect this session. Focused on sit <> stands with RW with cues for technique and assist for anterior weightshift. Pt requires several attempts and max assist to complete this session. Final trial pt able to gait x 14' with mod progressing to min assist with RW with cues for forward gaze and hip/trunk extension with +2 needed for close w/c follow. Decreased need for PT to block RLE in stance.   Session #2: Time: 1415-1445 (30 min) Denies pain. Session focused on w/c propulsion for functional strengthening and endurance and simulated car transfer. Pt continues to demonstrate decreased clearance of bottom during transfer in and out of car. Required min assist into the car but lifting assist for uphill out of the car. Recommend trial with slideboard transfer in future session especially if patient continues to demonstrate inconsistent ability to clear bottom.     Patient has met 2 of 3 short term goals. Pt is continuing to make progress with mobility as cognition is improving as well. Pt continues to require overall mod assist for transfers and supervision for w/c mobility. Pt continues to require mod to max assist for sit <> stands and inconsistent with progression of gait. Pt has been able to do max of 12' with RW with max assist for facilitation of weightshift and blocking of R knee. Plan is to potentially d/c to daughters home in [place] but this is still ongoing to determine who caregivers will be.   Patient continues to demonstrate the following deficits muscle weakness, muscle joint tightness and  muscle paralysis, decreased cardiorespiratoy endurance, impaired timing and sequencing, decreased coordination and decreased motor planning, decreased attention to left, decreased initiation, decreased attention, decreased awareness, decreased problem solving, decreased memory and delayed processing and decreased sitting balance, decreased standing balance, decreased postural control, decreased balance strategies and difficulty maintaining precautions and therefore will continue to benefit from skilled PT intervention to increase functional independence with mobility.  Patient progressing toward long term goals..  Continue plan of care.  PT Short Term Goals Week 3:  PT Short Term Goal 1 (Week 3): Pt will be able to complete squat pivot transfer with mod assist (with full clearance of bottom) PT Short Term Goal 1 - Progress (Week 3): Met PT Short Term Goal 2 (Week 3): Pt will be able to tolerate dynamic standing balance for functional task x 1 min with mod assist PT Short Term Goal 2 - Progress (Week 3): Progressing toward goal PT Short Term Goal 3 (Week 3): Pt will be able to perform car transfer with mod assist PT Short Term Goal 3 - Progress (Week 3): Met Week 4:  PT Short Term Goal 1 (Week 4): = LTGS   Skilled Therapeutic Interventions/Progress Updates:  Ambulation/gait training;Cognitive remediation/compensation;Discharge planning;DME/adaptive equipment instruction;Functional mobility training;Pain management;Splinting/orthotics;Therapeutic Activities;UE/LE Strength taining/ROM;Wheelchair propulsion/positioning;UE/LE Coordination activities;Therapeutic Exercise;Stair training;Patient/family education;Neuromuscular re-education;Disease management/prevention;Community reintegration;Balance/vestibular training;Psychosocial support;Visual/perceptual remediation/compensation;Skin care/wound management;Functional electrical stimulation   Therapy Documentation Precautions:  Precautions Precautions:  Fall, Back Precaution Comments: recalled 2/3 back precautions Restrictions Weight Bearing Restrictions: No   See Function Navigator for Current Functional Status.  Therapy/Group: Individual Therapy  Canary Brim Skeet Simmer  Atlee Abide, PT, DPT  08/16/2016, 2:52 PM

## 2016-08-15 NOTE — Progress Notes (Signed)
Speech Language Pathology Daily Session Note  Patient Details  Name: Katrina Donaldson MRN: [id number] Date of Birth: [birth year]  Today's Date: 08/15/2016 SLP Individual Time: 0950-1030 SLP Individual Time Calculation (min): 40 min  Short Term Goals: Week 3: SLP Short Term Goal 1 (Week 3): Patient will recall new, daily information with supervision verbal cues.  SLP Short Term Goal 2 (Week 3): Patient will demonstrate functional problem solving for mildly complex tasks with supervision verbal cues.  SLP Short Term Goal 3 (Week 3): Patient will demosntrate selective attention to a functional task in a moderately distracting enviornment for 30 minutes with supervision verbal cues.   Skilled Therapeutic Interventions: Skilled treatment session focused on cognitive goals. SLP facilitated session by providing supervision verbal cues for problem solving during a mildly complex calendar organization task. Patient participated in task for ~20 minutes in a moderately distracting environment with Mod I. Patient requested to use the bathroom at end of session and was transferred to the commode via the [place] with +2 assist and Min A verbal cues for safety with task. Patient left on commode and NT aware. Continue with current plan of care.      Function:  Cognition Comprehension Comprehension assist level: Understands complex 90% of the time/cues 10% of the time  Expression   Expression assist level: Expresses complex 90% of the time/cues < 10% of the time  Social Interaction Social Interaction assist level: Interacts appropriately with others with medication or extra time (anti-anxiety, antidepressant).  Problem Solving Problem solving assist level: Solves complex 90% of the time/cues < 10% of the time  Memory Memory assist level: Recognizes or recalls 90% of the time/requires cueing < 10% of the time    Pain No/Denies Pain   Therapy/Group: Individual Therapy  [name] 08/15/2016, 12:47  PM

## 2016-08-15 NOTE — Progress Notes (Signed)
Physical Therapy Session Note  Patient Details  Name: [name] MRN: [id number] Date of Birth: 1956-05-30  Today's Date: 08/15/2016 PT Individual Time: [id number] PT Individual Time Calculation (min): 45 min   Short Term Goals: Week 3:  PT Short Term Goal 1 (Week 3): Pt will be able to complete squat pivot transfer with mod assist (with full clearance of bottom) PT Short Term Goal 2 (Week 3): Pt will be able to tolerate dynamic standing balance for functional task x 1 min with mod assist PT Short Term Goal 3 (Week 3): Pt will be able to perform car transfer with mod assist  Skilled Therapeutic Interventions/Progress Updates:    Session focused on functional bed mobility, scoot pivot transfers with min assist, w/c propulsion on unit and through obstacle course during Olympic Party with supervision, and dynamic sitting balance activity to play shuffledboard against another player from w/c at supervision level. Pt left at party awaiting next OT.  Therapy Documentation Precautions:  Precautions Precautions: Fall, Back Precaution Comments: recalled 2/3 back precautions Restrictions Weight Bearing Restrictions: No  Pain:  Denies pain.   See Function Navigator for Current Functional Status.   Therapy/Group: Individual Therapy  Canary Brim Ivory Broad, PT, DPT  08/15/2016, 2:41 PM

## 2016-08-15 NOTE — Progress Notes (Signed)
Social Work Patient ID: Mardene Sayer, female   DOB: Nov 22, 1955, 61 y.o.   MRN: [id number]  Have reviewed team conference with both pt and daughter, Shirlee Limerick (via phone - living in [place]).  Both understand that we need to confirm d/c location and plan.  Pt making excellent progress this week, however, still expected to need 24/7 physical assist at d/c.  At this point, daughter plans to come to [facility] when mother discharges and to provide/ arrange needed assistance.  She is talking and coordinating this with her brother.  Have discussed with tx team, MD and insurance company about a slight extension of LOS with d/c date changed to 2/24 to allow for family education.  All agreed. Will talk further with daughter to confirm a day that she will be in for education prior to d/c.  Continue to follow.  Yuriana Gaal, LCSW

## 2016-08-16 ENCOUNTER — Inpatient Hospital Stay (HOSPITAL_COMMUNITY): Payer: BC Managed Care – PPO | Admitting: Speech Pathology

## 2016-08-16 ENCOUNTER — Inpatient Hospital Stay (HOSPITAL_COMMUNITY): Payer: BC Managed Care – PPO

## 2016-08-16 LAB — CBC WITH DIFFERENTIAL/PLATELET
Basophils Absolute: 0 10*3/uL (ref 0.0–0.1)
Basophils Relative: 0 %
EOS ABS: 0.2 10*3/uL (ref 0.0–0.7)
EOS PCT: 5 %
HCT: 32.9 % — ABNORMAL LOW (ref 36.0–46.0)
Hemoglobin: 10.3 g/dL — ABNORMAL LOW (ref 12.0–15.0)
LYMPHS ABS: 1.7 10*3/uL (ref 0.7–4.0)
Lymphocytes Relative: 46 %
MCH: 27.3 pg (ref 26.0–34.0)
MCHC: 31.3 g/dL (ref 30.0–36.0)
MCV: 87.3 fL (ref 78.0–100.0)
MONO ABS: 0.4 10*3/uL (ref 0.1–1.0)
MONOS PCT: 9 %
Neutro Abs: 1.6 10*3/uL — ABNORMAL LOW (ref 1.7–7.7)
Neutrophils Relative %: 40 %
PLATELETS: 260 10*3/uL (ref 150–400)
RBC: 3.77 MIL/uL — ABNORMAL LOW (ref 3.87–5.11)
RDW: 17.6 % — ABNORMAL HIGH (ref 11.5–15.5)
WBC: 3.9 10*3/uL — ABNORMAL LOW (ref 4.0–10.5)

## 2016-08-16 LAB — BASIC METABOLIC PANEL
Anion gap: 7 (ref 5–15)
BUN: 10 mg/dL (ref 6–20)
CHLORIDE: 106 mmol/L (ref 101–111)
CO2: 26 mmol/L (ref 22–32)
CREATININE: 0.89 mg/dL (ref 0.44–1.00)
Calcium: 8.4 mg/dL — ABNORMAL LOW (ref 8.9–10.3)
GFR calc Af Amer: >60 mL/min (ref >60)
GFR calc non Af Amer: >60 mL/min (ref >60)
Glucose, Bld: 84 mg/dL (ref 65–99)
Potassium: 4.4 mmol/L (ref 3.5–5.1)
Sodium: 139 mmol/L (ref 135–145)

## 2016-08-16 NOTE — Progress Notes (Signed)
Speech Language Pathology Daily Session Note  Patient Details  Name: Katrina Donaldson MRN: [id number] Date of Birth: 1956/06/14  Today's Date: 08/16/2016 SLP Individual Time: [id number] SLP Individual Time Calculation (min): 30 min  Short Term Goals: Week 3: SLP Short Term Goal 1 (Week 3): Patient will recall new, daily information with supervision verbal cues.  SLP Short Term Goal 2 (Week 3): Patient will demonstrate functional problem solving for mildly complex tasks with supervision verbal cues.  SLP Short Term Goal 3 (Week 3): Patient will demosntrate selective attention to a functional task in a moderately distracting enviornment for 30 minutes with supervision verbal cues.   Skilled Therapeutic Interventions: Skilled treatment session focused on cognitive goals. Patient recalled and completed mildly complex calendar making task that was initiated during yesterday's session with Mod I for problem solving and organization. Patient also demonstrated selective attention to task for 30 minutes in a mildly distracting environment with Mod I. Patient left upright in wheelchair with all needs within reach. Continue with current plan of care.      Function:   Cognition Comprehension Comprehension assist level: Understands complex 90% of the time/cues 10% of the time  Expression   Expression assist level: Expresses complex 90% of the time/cues < 10% of the time  Social Interaction Social Interaction assist level: Interacts appropriately with others with medication or extra time (anti-anxiety, antidepressant).  Problem Solving Problem solving assist level: Solves complex 90% of the time/cues < 10% of the time  Memory Memory assist level: Recognizes or recalls 90% of the time/requires cueing < 10% of the time    Pain Pain in upper back, RN aware and administered medications   Therapy/Group: Individual Therapy  Katrina Donaldson 08/16/2016, 3:16 PM

## 2016-08-16 NOTE — Progress Notes (Signed)
[facility] PHYSICAL MEDICINE & REHABILITATION     PROGRESS NOTE  Subjective/Complaints:  Pt seen laying in bed this AM.  She slept well overnight, but is still sleepy this AM.   ROS: Denies nausea, vomiting, diarrhea, shortness of breath or chest pain  Objective: Vital Signs: Blood pressure 134/81, pulse 60, temperature 98.2 F (36.8 C), temperature source Oral, resp. rate 16, height 5\' 5"  (1.651 m), weight 86 kg (189 lb 9.5 oz), SpO2 97 %. No results found.  Recent Labs  08/16/16 0516  WBC 3.9*  HGB 10.3*  HCT 32.9*  PLT 260    Recent Labs  08/16/16 0516  NA 139  K 4.4  CL 106  GLUCOSE 84  BUN 10  CREATININE 0.89  CALCIUM 8.4*   CBG (last 3)  No results for input(s): GLUCAP in the last 72 hours.  Wt Readings from Last 3 Encounters:  07/24/16 86 kg (189 lb 9.5 oz)  07/19/16 90.8 kg (200 lb 2.8 oz)  07/21/15 85.7 kg (189 lb)    Physical Exam:  BP 134/81 (BP Location: Right Arm)   Pulse 60   Temp 98.2 F (36.8 C) (Oral)   Resp 16   Ht 5\' 5"  (1.651 m)   Wt 86 kg (189 lb 9.5 oz)   SpO2 97%   BMI 31.55 kg/m  Constitutional: She appears well-developed. NAD. Obese  HENT: Normocephalic and atraumatic.  Eyes: EOM are normal. No discharge. Cardiovascular: RRR. No JVD. Respiratory: Clear bilaterally. Unlabored.  GI: Soft. Bowel sounds are normal.   Musculoskeletal: She exhibits no tenderness or edema, including right knee.  Neurological: She is alert and oriented x3. Follows full commands.  Motor: LUE: 4+/5 proximal to distal LLE: 4/5 HF,KE, 4+/5 ADF/PF RUE: 4+/5 proximal to distal RLE: HF 4/5, KE 2+/5, ADF/PF 3/5 (unchanged).  Skin: Skin is warm and dry.  Back incision c/d/i. Psychiatric: She has a normal mood and affect.   Assessment/Plan: 1. Functional deficits secondary to myelopathy which require 3+ hours per day of interdisciplinary therapy in a comprehensive inpatient rehab setting. Physiatrist is providing close team supervision and 24 hour  management of active medical problems listed below. Physiatrist and rehab team continue to assess barriers to discharge/monitor patient progress toward functional and medical goals.  Function:  Bathing Bathing position Bathing activity did not occur: Refused Position: Wheelchair/chair at sink  Bathing parts Body parts bathed by patient: Right arm, Left arm, Chest, Abdomen Body parts bathed by helper: Buttocks  Bathing assist Assist Level: Supervision or verbal cues      Upper Body Dressing/Undressing Upper body dressing   What is the patient wearing?: Pull over shirt/dress     Pull over shirt/dress - Perfomed by patient: Thread/unthread right sleeve, Thread/unthread left sleeve, Put head through opening, Pull shirt over trunk Pull over shirt/dress - Perfomed by helper: Pull shirt over trunk        Upper body assist Assist Level: Supervision or verbal cues, Set up   Set up : To obtain clothing/put away  Lower Body Dressing/Undressing Lower body dressing   What is the patient wearing?: Pants, [employer], Shoes   Underwear - Performed by helper: Thread/unthread right underwear leg, Thread/unthread left underwear leg, Pull underwear up/down Pants- Performed by patient: Thread/unthread right pants leg, Thread/unthread left pants leg Pants- Performed by helper: Pull pants up/down   Non-skid slipper socks- Performed by helper: Don/doff right sock, Don/doff left sock Socks - Performed by patient: Don/doff right sock, Don/doff left sock Socks - Performed  by helper: Don/doff left sock, Don/doff right sock Shoes - Performed by patient: Don/doff right shoe Shoes - Performed by helper: Don/doff left shoe       TED Hose - Performed by helper: Don/doff left TED hose, Don/doff right TED hose  Lower body assist Assist for lower body dressing:  (mod A) Assistive Device Comment: Tour manager activity did not occur:  (pt not voiding, doing scans & caths q 8  hours)   Toileting steps completed by helper: Adjust clothing prior to toileting, Performs perineal hygiene, Adjust clothing after toileting Toileting Assistive Devices: Grab bar or rail, Other (comment) (Stedy)  Toileting assist Assist level: Two helpers (stedy)   Transfers Chair/bed transfer Chair/bed transfer activity did not occur: Safety/medical concerns Chair/bed transfer method: Stand pivot Chair/bed transfer assist level: Maximal assist (Pt 25 - 49%/lift and lower) Chair/bed transfer assistive device: Armrests Mechanical lift: Stedy   Locomotion Ambulation Ambulation activity did not occur: Safety/medical concerns   Max distance: 12 Assist level: 2 helpers (+1 for chair follow )   Wheelchair   Type: Manual Max wheelchair distance: 200' Assist Level: Supervision or verbal cues  Cognition Comprehension Comprehension assist level: Understands complex 90% of the time/cues 10% of the time  Expression Expression assist level: Expresses complex 90% of the time/cues < 10% of the time  Social Interaction Social Interaction assist level: Interacts appropriately with others with medication or extra time (anti-anxiety, antidepressant).  Problem Solving Problem solving assist level: Solves complex 90% of the time/cues < 10% of the time  Memory Memory assist level: Recognizes or recalls 90% of the time/requires cueing < 10% of the time    Medical Problem List and Plan: 1.  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 No brace required.   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    Per Neurosurg, repeat scan in 3 months.   Making progress 2.  DVT Prophylaxis/Anticoagulation:   Vascular study  showing extensive DVT, including right iliac, but IVC appears to be clean  Continue Xarelto  3. Pain Management: Robaxin and oxycodone as needed.   Added lidoderm patch to right knee with benefit 4. Mood: Paxil 20 mg daily, Provide emotional support 5. Neuropsych: This patient is capable of making decisions on her own behalf. 6. Skin/Wound Care: Routine skin checks 7. Fluids/Electrolytes/Nutrition: Routine I&Os  Remeron started 1/29,  Eating baseline  appreciate RD assistance 8. Hypertension. Norvasc 10 mg daily, Lopressor 100 mg twice a day.   Controlled 2/16 9. 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 10. History hypercoagulable state. INR 1.09. 11. Constipation. Laxative assistive 12. Hypokalemia: Resolved  K+ 4.4 on 2/16 13. Leukocytosis: Resolved  Afebrile  Improving 14. ABLA  Hb 10.3 on 2/16   FeSO4 and Vit C started 2/9  Cont to monitor 15. Acute lower UTI, E.Coli  Macrobid started 2/14-2/20   LOS (Days) 23 A FACE TO FACE EVALUATION WAS PERFORMED  Kalena Mander Lorie Phenix 08/16/2016 9:28 AM

## 2016-08-16 NOTE — Progress Notes (Signed)
Occupational Therapy Session Note  Patient Details  Name: Katrina Donaldson MRN: [id number] Date of Birth: 11-30-1955  Today's Date: 08/16/2016 OT Individual Time:  9:00- 10:00 and 1:00-1;45   60 min and 45 min   Short Term Goals: Week 2:  OT Short Term Goal 1 (Week 2): Pt will sit to stand with MOD A in prep for clothing management. OT Short Term Goal 2 (Week 2): Pt will thread pants over feet with SET UP. OT Short Term Goal 3 (Week 2): Pt will bathe seated to was 10/10 body parts with MIN A and AE PRN.   Skilled Therapeutic Interventions/Progress Updates:   Session 1: Upon arrival pt supine in bed reporting no pain and agreeable to tx. Pt supine with HOB elevated>sit EOB with CGA for balance and Vc for back precautions. For all STS in stedy pt req MOD A to lift with VC for posture and hip extension. Pt transferred to TTB with stedy with A to advance pants past hips in prep for bathing. Pt showers on TTB with long handled shower sponge and A to wash bottom. Pt able to thread BLE into pants with reacher with VC for dressing technique and requires A to advance pants past hips while standing on stedy. Pt trf with stedy to w/c to don B socks using sock aide. Pt requires VC to orient bottom of sock on sock aide. Pt left in room with call light in place and all needs met.   Session 2: Upon entering room, pt seated in w/c having just received lunch tray. Pt agreeable to tx, however requests to eat first and refuses to eat EOB d/t 5/10 back pain. RN notified and asked for pain medication. Focus of session on transitional movement and functional mobility in prep for use for ADLs. Pt squat pivot trf w/c>EOM with supervision and Vc for forward trunk flexion. Pt laterally scoots with VC to clear buttocks to end of mat. Pt completes 2 STS trials lasts ~45 seconds with MOD A for lifting and VC for safety awareness. Pt scoots back to w/c and squat pivot trf as stated before. Pt propels w/c to high/low table and  deals out solitaire game with VC for set up. Pt STS x2 from w/c with MAX A, VC for posture and L knee blocking. Pt refuses use unilateral support on table to play card game in standing. Pt stands ~75 seconds per trial. Exited session with direct hand off to SLP.   Therapy Documentation Precautions:  Precautions Precautions: Fall, Back Precaution Comments: recalled 2/3 back precautions Restrictions Weight Bearing Restrictions: No General:     Other Treatments:    See Function Navigator for Current Functional Status.   Therapy/Group: Individual Therapy  Tonny Branch 08/16/2016, 12:21 PM

## 2016-08-17 ENCOUNTER — Inpatient Hospital Stay (HOSPITAL_COMMUNITY): Payer: BC Managed Care – PPO | Admitting: Occupational Therapy

## 2016-08-17 NOTE — Progress Notes (Signed)
[place] PHYSICAL MEDICINE & REHABILITATION     PROGRESS NOTE  Subjective/Complaints:  No complaints. Slept well. Denies pain.  ROS: pt denies nausea, vomiting, diarrhea, cough, shortness of breath or chest pain  Objective: Vital Signs: Blood pressure 102/65, pulse 62, temperature 98.6 F (37 C), temperature source Oral, resp. rate 16, height 5\' 5"  (1.651 m), weight 86 kg (189 lb 9.5 oz), SpO2 100 %. No results found.  Recent Labs  08/16/16 0516  WBC 3.9*  HGB 10.3*  HCT 32.9*  PLT 260    Recent Labs  08/16/16 0516  NA 139  K 4.4  CL 106  GLUCOSE 84  BUN 10  CREATININE 0.89  CALCIUM 8.4*   CBG (last 3)  No results for input(s): GLUCAP in the last 72 hours.  Wt Readings from Last 3 Encounters:  07/24/16 86 kg (189 lb 9.5 oz)  07/19/16 90.8 kg (200 lb 2.8 oz)  07/21/15 85.7 kg (189 lb)    Physical Exam:  BP 102/65 (BP Location: Right Arm)   Pulse 62   Temp 98.6 F (37 C) (Oral)   Resp 16   Ht 5\' 5"  (1.651 m)   Wt 86 kg (189 lb 9.5 oz)   SpO2 100%   BMI 31.55 kg/m  Constitutional: She appears well-developed. NAD. Obese  HENT: Normocephalic and atraumatic.  Eyes: EOM are normal. No discharge. Cardiovascular: RRR. Respiratory: Clear bilaterally. Unlabored.  GI: Soft. Bowel sounds are normal.   Musculoskeletal: She exhibits no tenderness or edema, including right knee.  Neurological: She is alert and oriented x3. Follows full commands.  Motor: LUE: 4+/5 proximal to distal LLE: 4/5 HF,KE, 4+/5 ADF/PF RUE: 4+/5 proximal to distal RLE: HF 4/5, KE 2+/5, ADF/PF 3/5 (unchanged).  Skin: Skin is warm and dry.  Back incision c/d/i. Psychiatric: She has a normal mood and affect.   Assessment/Plan: 1. Functional deficits secondary to myelopathy which require 3+ hours per day of interdisciplinary therapy in a comprehensive inpatient rehab setting. Physiatrist is providing close team supervision and 24 hour management of active medical problems listed  below. Physiatrist and rehab team continue to assess barriers to discharge/monitor patient progress toward functional and medical goals.  Function:  Bathing Bathing position Bathing activity did not occur: Refused Position: Production manager parts bathed by patient: Right arm, Left arm, Chest, Front perineal area, Abdomen, Right upper leg, Left upper leg, Right lower leg, Left lower leg, Back Body parts bathed by helper: Buttocks  Bathing assist Assist Level: Supervision or verbal cues      Upper Body Dressing/Undressing Upper body dressing   What is the patient wearing?: Pull over shirt/dress     Pull over shirt/dress - Perfomed by patient: Thread/unthread right sleeve, Thread/unthread left sleeve, Put head through opening, Pull shirt over trunk Pull over shirt/dress - Perfomed by helper: Pull shirt over trunk        Upper body assist Assist Level: Supervision or verbal cues, Set up   Set up : To obtain clothing/put away  Lower Body Dressing/Undressing Lower body dressing   What is the patient wearing?: Pants, Socks, Shoes   Underwear - Performed by helper: Thread/unthread right underwear leg, Thread/unthread left underwear leg, Pull underwear up/down Pants- Performed by patient: Thread/unthread right pants leg, Thread/unthread left pants leg Pants- Performed by helper: Pull pants up/down   Non-skid slipper socks- Performed by helper: Don/doff right sock, Don/doff left sock Socks - Performed by patient: Don/doff right sock, Don/doff left sock Socks - Performed  by helper: Don/doff left sock, Don/doff right sock Shoes - Performed by patient: Don/doff right shoe Shoes - Performed by helper: Don/doff right shoe, Don/doff left shoe       TED Hose - Performed by helper: Don/doff left TED hose, Don/doff right TED hose  Lower body assist Assist for lower body dressing:  (mod A) Assistive Device Comment: Tour manager activity did not occur:   (pt not voiding, doing scans & caths q 8 hours)   Toileting steps completed by helper: Adjust clothing prior to toileting, Performs perineal hygiene, Adjust clothing after toileting Toileting Assistive Devices: Grab bar or rail, Other (comment) (Stedy)  Toileting assist Assist level: Two helpers (stedy)   Transfers Chair/bed transfer Chair/bed transfer activity did not occur: Safety/medical concerns Chair/bed transfer method: Squat pivot Chair/bed transfer assist level: Supervision or verbal cues Chair/bed transfer assistive device: Armrests Mechanical lift: Stedy   Locomotion Ambulation Ambulation activity did not occur: Safety/medical concerns   Max distance: 10' Assist level: 2 helpers (min to mod assist with +2 for w/c follow)   Wheelchair   Type: Manual Max wheelchair distance: 200' Assist Level: Supervision or verbal cues  Cognition Comprehension Comprehension assist level: Understands complex 90% of the time/cues 10% of the time  Expression Expression assist level: Expresses complex 90% of the time/cues < 10% of the time  Social Interaction Social Interaction assist level: Interacts appropriately with others with medication or extra time (anti-anxiety, antidepressant).  Problem Solving Problem solving assist level: Solves complex 90% of the time/cues < 10% of the time  Memory Memory assist level: Recognizes or recalls 90% of the time/requires cueing < 10% of the time    Medical Problem List and Plan: 1.  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 No brace required.   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    Per Neurosurg, repeat scan in 3 months.   Making progress 2.  DVT  Prophylaxis/Anticoagulation:   Vascular study showing extensive DVT, including right iliac, but IVC appears to be clean  Continue Xarelto  3. Pain Management: Robaxin and oxycodone as needed.   Added lidoderm patch to right knee with benefit--she likes this a lot  -ice prn also 4. Mood: Paxil 20 mg daily, Provide emotional support 5. Neuropsych: This patient is capable of making decisions on her own behalf. 6. Skin/Wound Care: Routine skin checks 7. Fluids/Electrolytes/Nutrition: Routine I&Os  Remeron started 1/29,  Eating baseline  appreciate RD assistance 8. Hypertension. Norvasc 10 mg daily, Lopressor 100 mg twice a day.   Controlled 2/16 9. 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 10. History hypercoagulable state. INR 1.09. 11. Constipation. Laxative assistive 12. Hypokalemia: Resolved  K+ 4.4 on 2/16 13. Leukocytosis: Resolved  Afebrile  Improving 14. ABLA  Hb 10.3 on 2/16   FeSO4 and Vit C started 2/9  Cont to monitor 15. Acute lower UTI, E.Coli  Macrobid started 2/14-2/20   LOS (Days) 24 A FACE TO FACE EVALUATION WAS PERFORMED  SWARTZ,ZACHARY T 08/17/2016 10:20 AM

## 2016-08-17 NOTE — Progress Notes (Signed)
Occupational Therapy Weekly Progress Note  Patient Details  Name: Katrina Donaldson MRN: [id number] Date of Birth: [birth year]  Beginning of progress report period: August 02, 2016 End of progress report period: August 17, 2016  Today's Date: 08/17/2016 OT Individual Time: 0930-1030 OT Individual Time Calculation (min): 60 min    Patient has met 2 of 3 short term goals.  Pt is making slow progress towards goals.  Pt currently requires max assist sit > stand and continues to require increased time, cues, and assistance for self-care tasks.  Pt continues to report stiffness and pain in back with all mobility.  Pt will require physical assistance upon d/c.  Extensive hands on family education will need to be completed prior to d/c.  Patient continues to demonstrate the following deficits: muscle weakness and acute pain, decreased cardiorespiratoy endurance, impaired timing and sequencing, unbalanced muscle activation, decreased coordination and decreased motor planning, decreased initiation, decreased attention, decreased awareness, decreased problem solving, decreased safety awareness, decreased memory and delayed processing and decreased standing balance, decreased postural control, decreased balance strategies and difficulty maintaining precautions  and therefore will continue to benefit from skilled OT intervention to enhance overall performance with BADL and Reduce care partner burden.  Patient progressing toward long term goals..  Continue plan of care.  OT Short Term Goals Week 2:  OT Short Term Goal 1 (Week 2): Pt will sit to stand with MOD A in prep for clothing management. OT Short Term Goal 1 - Progress (Week 2): Progressing toward goal OT Short Term Goal 2 (Week 2): Pt will thread pants over feet with SET UP. OT Short Term Goal 2 - Progress (Week 2): Met OT Short Term Goal 3 (Week 2): Pt will bathe seated to was 10/10 body parts with MIN A and AE PRN.  OT Short Term Goal 3 - Progress  (Week 2): Met Week 3:  OT Short Term Goal 1 (Week 3): STG = LTGs due to remaining LOS  Skilled Therapeutic Interventions/Progress Updates:    Treatment session with focus on LB dressing.  Pt received supine in bed willing to participate in treatment session.  Performed bed mobility with supervision.  Pt able to thread pants with increased time and use of reacher.  Provided small plastic bag to decrease friction when donning TEDS, therapist still donning secondary to back precautions.  Pt required max assist for sit > stand to pull up pants, requiring assist to pull pants fully over hips.  Educated on possible lateral leans to complete LB dressing with pt willing to attempt next session.  Pt donned Rt shoe with increased time and use of shoe funnel.  Pt attempted Lt shoe with shoe funnel and sock aid, however still required assistance.  Completed squat pivot transfers min assist Rt and Lt bed > w/c <>therapy mat.  Engaged in lateral leans on mat with focus on maintaining back precautions while pulling theraband over hips to simulate LB dressing, pt will benefit from additional practice.  Therapy Documentation Precautions:  Precautions Precautions: Fall, Back Precaution Comments: recalled 2/3 back precautions Restrictions Weight Bearing Restrictions: No General:   Vital Signs:   Pain: Pain Assessment Pain Assessment: 0-10 Pain Score: 0-No pain Pain Type: Acute pain Pain Location: Back Pain Orientation: Lower Pain Descriptors / Indicators: Aching Pain Frequency: Constant Pain Onset: On-going Patients Stated Pain Goal: 0 Pain Intervention(s): Medication (See eMAR) Multiple Pain Sites: No ADL: ADL ADL Comments: refer to functional navigator Exercises:   Other Treatments:    See Function  Navigator for Current Functional Status.   Therapy/Group: Individual Therapy  Simonne Come 08/17/2016, 12:26 PM

## 2016-08-18 MED ORDER — SORBITOL 70 % SOLN: 60.0000 mL | Status: AC

## 2016-08-18 NOTE — Plan of Care (Signed)
Problem: SCI BOWEL ELIMINATION Goal: RH STG MANAGE BOWEL WITH ASSISTANCE STG Manage Bowel with Assistance. min  Outcome: Not Progressing No BM in 4 days. Goal: RH STG SCI MANAGE BOWEL WITH MEDICATION WITH ASSISTANCE STG SCI Manage bowel with medication with assistance. min  Outcome: Not Progressing No BM 4 days, declining PRN laxatives

## 2016-08-18 NOTE — Progress Notes (Signed)
[place] PHYSICAL MEDICINE & REHABILITATION     PROGRESS NOTE  Subjective/Complaints:  No new complaints. Hasn't had bm for a few days.   ROS: pt denies nausea, vomiting, diarrhea, cough, shortness of breath or chest pain   Objective: Vital Signs: Blood pressure 118/68, pulse (!) 58, temperature 98.7 F (37.1 C), temperature source Oral, resp. rate 16, height 5\' 5"  (1.651 m), weight 86 kg (189 lb 9.5 oz), SpO2 98 %. No results found.  Recent Labs  08/16/16 0516  WBC 3.9*  HGB 10.3*  HCT 32.9*  PLT 260    Recent Labs  08/16/16 0516  NA 139  K 4.4  CL 106  GLUCOSE 84  BUN 10  CREATININE 0.89  CALCIUM 8.4*   CBG (last 3)  No results for input(s): GLUCAP in the last 72 hours.  Wt Readings from Last 3 Encounters:  07/24/16 86 kg (189 lb 9.5 oz)  07/19/16 90.8 kg (200 lb 2.8 oz)  07/21/15 85.7 kg (189 lb)    Physical Exam:  BP 118/68 (BP Location: Left Arm)   Pulse (!) 58   Temp 98.7 F (37.1 C) (Oral)   Resp 16   Ht 5\' 5"  (1.651 m)   Wt 86 kg (189 lb 9.5 oz)   SpO2 98%   BMI 31.55 kg/m  Constitutional: She appears well-developed. NAD. Obese  HENT: Normocephalic and atraumatic.  Eyes: EOM are normal. No discharge. Cardiovascular: RRR. Respiratory: Clear bilaterally. Unlabored.  GI: Soft. Bowel sounds are normal.   Musculoskeletal: She exhibits no tenderness or edema, including right knee.  Neurological: She is alert and oriented x3. Follows full commands.  Motor: LUE: 4+/5 proximal to distal LLE: 4/5 HF,KE, 4+/5 ADF/PF RUE: 4+/5 proximal to distal RLE: HF 4/5, KE 2+/5, ADF/PF 3/5 (unchanged).  Skin: Skin is warm and dry.  Back incision intact Psychiatric: She has a normal mood and affect.   Assessment/Plan: 1. Functional deficits secondary to myelopathy which require 3+ hours per day of interdisciplinary therapy in a comprehensive inpatient rehab setting. Physiatrist is providing close team supervision and 24 hour management of active medical  problems listed below. Physiatrist and rehab team continue to assess barriers to discharge/monitor patient progress toward functional and medical goals.  Function:  Bathing Bathing position Bathing activity did not occur: Refused Position: Production manager parts bathed by patient: Right arm, Left arm, Chest, Front perineal area, Abdomen, Right upper leg, Left upper leg, Right lower leg, Left lower leg, Back Body parts bathed by helper: Buttocks  Bathing assist Assist Level: Supervision or verbal cues      Upper Body Dressing/Undressing Upper body dressing   What is the patient wearing?: Pull over shirt/dress     Pull over shirt/dress - Perfomed by patient: Thread/unthread right sleeve, Thread/unthread left sleeve, Put head through opening, Pull shirt over trunk Pull over shirt/dress - Perfomed by helper: Pull shirt over trunk        Upper body assist Assist Level: Supervision or verbal cues, Set up   Set up : To obtain clothing/put away  Lower Body Dressing/Undressing Lower body dressing   What is the patient wearing?: Pants, Shoes, [employer] - Performed by helper: Thread/unthread right underwear leg, Thread/unthread left underwear leg, Pull underwear up/down Pants- Performed by patient: Thread/unthread right pants leg, Thread/unthread left pants leg Pants- Performed by helper: Pull pants up/down   Non-skid slipper socks- Performed by helper: Don/doff right sock, Don/doff left sock Socks - Performed by patient:  Don/doff right sock, Don/doff left sock Socks - Performed by helper: Don/doff left sock, Don/doff right sock Shoes - Performed by patient: Don/doff right shoe Shoes - Performed by helper: Don/doff left shoe       TED Hose - Performed by helper: Don/doff left TED hose, Don/doff right TED hose  Lower body assist Assist for lower body dressing:  (Mod assist) Assistive Device Comment: reacher and shoe funnel    Toileting Toileting Toileting  activity did not occur: No continent bowel/bladder event   Toileting steps completed by helper: Adjust clothing prior to toileting, Performs perineal hygiene, Adjust clothing after toileting Toileting Assistive Devices: Grab bar or rail, Other (comment) (Stedy)  Toileting assist Assist level: Two helpers (stedy)   Transfers Chair/bed transfer Chair/bed transfer activity did not occur: Safety/medical concerns Chair/bed transfer method: Squat pivot Chair/bed transfer assist level: Touching or steadying assistance (Pt > 75%) Chair/bed transfer assistive device: Armrests Mechanical lift: Stedy   Locomotion Ambulation Ambulation activity did not occur: Safety/medical concerns   Max distance: 25' Assist level: 2 helpers (min to mod assist with +2 for w/c follow)   Wheelchair   Type: Manual Max wheelchair distance: 200' Assist Level: Supervision or verbal cues  Cognition Comprehension Comprehension assist level: Understands complex 90% of the time/cues 10% of the time  Expression Expression assist level: Expresses complex 90% of the time/cues < 10% of the time  Social Interaction Social Interaction assist level: Interacts appropriately with others with medication or extra time (anti-anxiety, antidepressant).  Problem Solving Problem solving assist level: Solves complex 90% of the time/cues < 10% of the time  Memory Memory assist level: Recognizes or recalls 90% of the time/requires cueing < 10% of the time    Medical Problem List and Plan: 1.  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 No brace required.   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    Per Neurosurg, repeat scan in 3 months.    Making progress toward goals 2.  DVT Prophylaxis/Anticoagulation:   Vascular study showing extensive DVT, including right iliac, but IVC appears to be clean  Continue Xarelto  3. Pain Management: Robaxin and oxycodone as needed.   Added lidoderm patch to right knee with benefit--she likes this a lot  -ice prn also is helpful 4. Mood: Paxil 20 mg daily, Provide emotional support 5. Neuropsych: This patient is capable of making decisions on her own behalf. 6. Skin/Wound Care: Routine skin checks 7. Fluids/Electrolytes/Nutrition: Routine I&Os  Remeron started 1/29,  Eating baseline  appreciate RD assistance 8. Hypertension. Norvasc 10 mg daily, Lopressor 100 mg twice a day.   Controlled 2/16 9. 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 10. History hypercoagulable state. INR 1.09. 11. Constipation. Laxative assistive  -sorbitol/supp/enema today 12. Hypokalemia: Resolved  K+ 4.4 on 2/16 13. Leukocytosis: Resolved  Afebrile  Improving 14. ABLA  Hb 10.3 on 2/16   FeSO4 and Vit C started 2/9  Cont to monitor 15. Acute lower UTI, E.Coli  Macrobid started 2/14-2/20   LOS (Days) 25 A FACE TO FACE EVALUATION WAS PERFORMED  SWARTZ,ZACHARY T 08/18/2016 9:55 AM

## 2016-08-18 NOTE — Plan of Care (Signed)
Problem: SCI BOWEL ELIMINATION Goal: RH STG MANAGE BOWEL WITH ASSISTANCE STG Manage Bowel with Assistance. min  Outcome: Not Progressing No BM in four days. Did consume sorbitol this am, however, refused ordered soaps suds enema.

## 2016-08-18 NOTE — Plan of Care (Signed)
Problem: SCI BOWEL ELIMINATION Goal: RH STG SCI MANAGE BOWEL WITH MEDICATION WITH ASSISTANCE STG SCI Manage bowel with medication with assistance. min  Outcome: Not Progressing No bm in 4 days. Took Sorbitol this am but refused soap suds enema.

## 2016-08-19 ENCOUNTER — Inpatient Hospital Stay (HOSPITAL_COMMUNITY): Payer: BC Managed Care – PPO | Admitting: *Deleted

## 2016-08-19 ENCOUNTER — Inpatient Hospital Stay (HOSPITAL_COMMUNITY): Payer: BC Managed Care – PPO

## 2016-08-19 ENCOUNTER — Inpatient Hospital Stay (HOSPITAL_COMMUNITY): Payer: BC Managed Care – PPO | Admitting: Speech Pathology

## 2016-08-19 MED ORDER — LIDOCAINE 5 % EX PTCH
1.0000 | MEDICATED_PATCH | CUTANEOUS | Status: DC
  Administered 2016-08-20 – 2016-08-24 (×5): 1 via TRANSDERMAL
  Filled 2016-08-19 (×6): qty 1

## 2016-08-19 NOTE — Progress Notes (Signed)
Speech Language Pathology Daily Session Note  Patient Details  Name: Katrina Donaldson MRN: [id number] Date of Birth: 10-01-1955  Today's Date: 08/19/2016 SLP Individual Time: 1445-1515 SLP Individual Time Calculation (min): 30 min  Short Term Goals: Week 3: SLP Short Term Goal 1 (Week 3): Patient will recall new, daily information with supervision verbal cues.  SLP Short Term Goal 2 (Week 3): Patient will demonstrate functional problem solving for mildly complex tasks with supervision verbal cues.  SLP Short Term Goal 3 (Week 3): Patient will demosntrate selective attention to a functional task in a moderately distracting enviornment for 30 minutes with supervision verbal cues.   Skilled Therapeutic Interventions: Skilled treatment session focused on addressing cognition goals. SLP facilitated session by providing a new learning task with 2 task rules to recall how to clear cards to complete task.  SLP facilciated session with Min assist faded to Supervision recall and carryout procedures with Min assist verbal cues to recognize and correct errors.  Continue with current plan of care.    Function:  Cognition Comprehension Comprehension assist level: Understands complex 90% of the time/cues 10% of the time  Expression   Expression assist level: Expresses complex 90% of the time/cues < 10% of the time  Social Interaction Social Interaction assist level: Interacts appropriately with others with medication or extra time (anti-anxiety, antidepressant).  Problem Solving Problem solving assist level: Solves complex 90% of the time/cues < 10% of the time  Memory Memory assist level: Recognizes or recalls 90% of the time/requires cueing < 10% of the time    Pain Pain Assessment Pain Assessment: No/denies pain  Therapy/Group: Individual Therapy  Carmelia Roller., CCC-SLP [id number]  [place] 08/19/2016, 2:33 PM

## 2016-08-19 NOTE — Progress Notes (Signed)
2/19. Pt has not required I/O caths this shift; has been incontinent (no continent voids). Scans low volume.  Timed toileting attempts ineffective. Pt reports urgency.

## 2016-08-19 NOTE — Progress Notes (Signed)
Occupational Therapy Session Note  Patient Details  Name: Katrina Donaldson MRN: [id number] Date of Birth: 30-Sep-1955  Today's Date: 08/19/2016 OT Individual Time:  9:59- 10:45 and 15:30-16:28   46 min and 58 min   Short Term Goals: Week 2:  OT Short Term Goal 1 (Week 2): Pt will sit to stand with MOD A in prep for clothing management. OT Short Term Goal 1 - Progress (Week 2): Progressing toward goal OT Short Term Goal 2 (Week 2): Pt will thread pants over feet with SET UP. OT Short Term Goal 2 - Progress (Week 2): Met OT Short Term Goal 3 (Week 2): Pt will bathe seated to was 10/10 body parts with MIN A and AE PRN.  OT Short Term Goal 3 - Progress (Week 2): Met  Skilled Therapeutic Interventions/Progress Updates:    Pt seen supine in bed upon arrival, not reporting pain, and agreeable to tx. Pt supine>EOB>w/c with supervision for safety awareness. Pt squat pivot transfer from w/c to [facility] over toilet and has BM with significantly increased time to void. Pt STS in steady and OT cleans buttocks. Pt dons shirt and pants with reacher while seated in w/c with VC for dressing techniques for reacher use. Pt STS in stedy and advances pants over hips with VC for upright posture. OT dons B ted hose and shoes d/t time constraints and Pt brushes teeth at sink with SET UP Pt left in room seated in w/c, with all needs met and call light in reach.  Session 2: Pt seen in w/c upon entering room, with no c/o pain and agreeable to tx. Focus of session on ADL retraining and functional mobility in prep for clothing management for toileting. Pt STS from w/c with RW with MIN A and VC for posture and safety awareness. Pt squat pivot trf w/c<>BSC to practice toileting with supervision and VC for safety awareness. Pt STS with CGA-MIN A from [facility] with RW and advances pants up/down hips with CGA for balance, VC for posture and cues to not pull on Rw. Pt propels w/c to/from therapuetic destinations with increased time. With Vc,  Pt assumes seated figure 4 to don/doff shoes with A to bring leg into postition. Pt reports tightness in figure 4 and remains in position ~3 min per leg to stretch hamstring and hip. Pt returns to room in w/c with all needs met and call light in reach.   Therapy Documentation Precautions:  Precautions Precautions: Fall, Back Precaution Comments: recalled 2/3 back precautions Restrictions Weight Bearing Restrictions: No General:   Vital Signs:   Pain:   ADL: ADL ADL Comments: refer to functional navigator Exercises:   Other Treatments:    See Function Navigator for Current Functional Status.   Therapy/Group: Individual Therapy  Tonny Branch 08/19/2016, 11:09 AM

## 2016-08-19 NOTE — Progress Notes (Signed)
PHYSICAL MEDICINE & REHABILITATION     PROGRESS NOTE  Subjective/Complaints:  Pt seen laying in bed this AM.  Katrina Donaldson slept well overnight.  Katrina Donaldson notes improvement in strength.   ROS: Denies nausea, vomiting, diarrhea, shortness of breath or chest pain   Objective: Vital Signs: Blood pressure 120/76, pulse 67, temperature 98.3 F (36.8 C), temperature source Oral, resp. rate 16, height 5\' 5"  (1.651 m), weight 86 kg (189 lb 9.5 oz), SpO2 99 %. No results found. No results for input(s): WBC, HGB, HCT, PLT in the last 72 hours. No results for input(s): NA, K, CL, GLUCOSE, BUN, CREATININE, CALCIUM in the last 72 hours.  Invalid input(s): CO CBG (last 3)  No results for input(s): GLUCAP in the last 72 hours.  Wt Readings from Last 3 Encounters:  07/24/16 86 kg (189 lb 9.5 oz)  07/19/16 90.8 kg (200 lb 2.8 oz)  07/21/15 85.7 kg (189 lb)    Physical Exam:  BP 120/76 (BP Location: Left Arm)   Pulse 67   Temp 98.3 F (36.8 C) (Oral)   Resp 16   Ht 5\' 5"  (1.651 m)   Wt 86 kg (189 lb 9.5 oz)   SpO2 99%   BMI 31.55 kg/m  Constitutional: Katrina Donaldson appears well-developed. NAD. Obese  HENT: Normocephalic and atraumatic.  Eyes: EOM are normal. No discharge. Cardiovascular: RRR.No JVD. Respiratory: Clear bilaterally. unlabored.  GI: Soft. Bowel sounds are normal.   Musculoskeletal: Katrina Donaldson exhibits no tenderness or edema, including right knee.  Neurological: Katrina Donaldson is alert and oriented x3. Follows full commands.  Motor: LUE: 4+/5 proximal to distal LLE: 4+/5 HF,KE, 4+/5 ADF/PF RUE: 4+/5 proximal to distal RLE: HF 4/5, KE 4-/5, ADF/PF 4+/5.  Skin: Skin is warm and dry.  Back incision intact Psychiatric: Katrina Donaldson has a normal mood and affect.   Assessment/Plan: 1. Functional deficits secondary to myelopathy which require 3+ hours per day of interdisciplinary therapy in a comprehensive inpatient rehab setting. Physiatrist is providing close team supervision and 24 hour management of active  medical problems listed below. Physiatrist and rehab team continue to assess barriers to discharge/monitor patient progress toward functional and medical goals.  Function:  Bathing Bathing position Bathing activity did not occur: Refused Position: Production manager parts bathed by patient: Right arm, Left arm, Chest, Front perineal area, Abdomen, Right upper leg, Left upper leg, Right lower leg, Left lower leg, Back Body parts bathed by helper: Buttocks  Bathing assist Assist Level: Supervision or verbal cues      Upper Body Dressing/Undressing Upper body dressing   What is the patient wearing?: Pull over shirt/dress     Pull over shirt/dress - Perfomed by patient: Thread/unthread right sleeve, Thread/unthread left sleeve, Put head through opening, Pull shirt over trunk Pull over shirt/dress - Perfomed by helper: Pull shirt over trunk        Upper body assist Assist Level: Supervision or verbal cues, Set up   Set up : To obtain clothing/put away  Lower Body Dressing/Undressing Lower body dressing   What is the patient wearing?: Pants, Shoes, [employer] - Performed by helper: Thread/unthread right underwear leg, Thread/unthread left underwear leg, Pull underwear up/down Pants- Performed by patient: Thread/unthread right pants leg, Thread/unthread left pants leg Pants- Performed by helper: Pull pants up/down   Non-skid slipper socks- Performed by helper: Don/doff right sock, Don/doff left sock Socks - Performed by patient: Don/doff right sock, Don/doff left sock Socks - Performed by helper: Don/doff  left sock, Don/doff right sock Shoes - Performed by patient: Don/doff right shoe Shoes - Performed by helper: Don/doff left shoe       TED Hose - Performed by helper: Don/doff left TED hose, Don/doff right TED hose  Lower body assist Assist for lower body dressing:  (Mod assist) Assistive Device Comment: reacher and shoe funnel    Toileting Toileting  Toileting activity did not occur: No continent bowel/bladder event   Toileting steps completed by helper: Adjust clothing prior to toileting, Performs perineal hygiene, Adjust clothing after toileting Toileting Assistive Devices: Grab bar or rail, Other (comment) (Stedy)  Toileting assist Assist level: Two helpers (stedy)   Transfers Chair/bed transfer Chair/bed transfer activity did not occur: Safety/medical concerns Chair/bed transfer method: Squat pivot Chair/bed transfer assist level: Touching or steadying assistance (Pt > 75%) Chair/bed transfer assistive device: Armrests Mechanical lift: Stedy   Locomotion Ambulation Ambulation activity did not occur: Safety/medical concerns   Max distance: 39' Assist level: 2 helpers (min to mod assist with +2 for w/c follow)   Wheelchair   Type: Manual Max wheelchair distance: 200' Assist Level: Supervision or verbal cues  Cognition Comprehension Comprehension assist level: Understands complex 90% of the time/cues 10% of the time  Expression Expression assist level: Expresses complex 90% of the time/cues < 10% of the time  Social Interaction Social Interaction assist level: Interacts appropriately with others with medication or extra time (anti-anxiety, antidepressant).  Problem Solving Problem solving assist level: Solves complex 90% of the time/cues < 10% of the time  Memory Memory assist level: Recognizes or recalls 90% of the time/requires cueing < 10% of the time    Medical Problem List and Plan: 1.  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 No brace required.   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    Per Neurosurg, repeat scan in 3 months.  2.  DVT Prophylaxis/Anticoagulation  Vascular study showing extensive DVT, including right iliac, but IVC appears to be clean  Continue Xarelto  3. Pain Management: Robaxin and oxycodone as needed.   Added lidoderm patch to right knee with benefit  -ice prn also is helpful 4. Mood: Paxil 20 mg daily, Provide emotional support 5. Neuropsych: This patient is capable of making decisions on her own behalf. 6. Skin/Wound Care: Routine skin checks 7. Fluids/Electrolytes/Nutrition: Routine I&Os  Remeron started 1/29,  Eating baseline  appreciate RD assistance 8. Hypertension. Norvasc 10 mg daily, Lopressor 100 mg twice a day.   Controlled 2/19 9. 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 10. History hypercoagulable state. INR 1.09. 11. Constipation. Laxative assistive 12. Hypokalemia: Resolved  K+ 4.4 on 2/16 13. Leukocytosis: Resolved  Afebrile 14. ABLA  Hb 10.3 on 2/16   FeSO4 and Vit C started 2/9  Cont to monitor 15. Acute lower UTI, E.Coli  Macrobid 2/14-2/20   LOS (Days) 26 A FACE TO FACE EVALUATION WAS PERFORMED  Katrina Donaldson 08/19/2016 10:43 AM

## 2016-08-19 NOTE — Progress Notes (Signed)
Physical Therapy Session Note  Patient Details  Name: Katrina Donaldson MRN: [id number] Date of Birth: Jul 13, 1955  Today's Date: 08/19/2016 PT Individual Time: 1300-1400 PT Individual Time Calculation (min): 60 min   Short Term Goals: Week 4:  PT Short Term Goal 1 (Week 4): = LTGS   Skilled Therapeutic Interventions/Progress Updates: Pt presented in w/c. Transported to ortho gym performed car transfer w/c to elevated car seat as pt may be transported home in SUV. Pt required modA for w/c to car transfer with mod cues for hand placement and sequencing. MinA car to w/c "downhill". Gait training distance 68f and 16 ft with blocking of RLE and verbal cues for activation of R quad to achieve full extension. Pt able to improve quad activation with verbal cues during gait training session. Pt performed w/c propulsion around unit for endurance and UE strengthening. Pt returned to room and remained in w/c after session with needs met.      Therapy Documentation Precautions:  Precautions Precautions: Fall, Back Precaution Comments: recalled 2/3 back precautions Restrictions Weight Bearing Restrictions: No  See Function Navigator for Current Functional Status.   Therapy/Group: Individual Therapy  Katrina Donaldson  Katrina Donaldson, PTA  08/19/2016, 4:49 PM

## 2016-08-20 ENCOUNTER — Inpatient Hospital Stay (HOSPITAL_COMMUNITY): Payer: BC Managed Care – PPO

## 2016-08-20 ENCOUNTER — Inpatient Hospital Stay (HOSPITAL_COMMUNITY): Payer: BC Managed Care – PPO | Admitting: Speech Pathology

## 2016-08-20 DIAGNOSIS — R339 Retention of urine, unspecified: Secondary | ICD-10-CM

## 2016-08-20 MED ORDER — ENSURE ENLIVE PO LIQD
237.0000 mL | Freq: Every day | ORAL | Status: DC
  Administered 2016-08-22 – 2016-08-24 (×2): 237 mL via ORAL

## 2016-08-20 MED ORDER — BETHANECHOL CHLORIDE 10 MG PO TABS
10.0000 mg | ORAL_TABLET | Freq: Three times a day (TID) | ORAL | Status: DC
  Administered 2016-08-20 – 2016-08-21 (×4): 10 mg via ORAL
  Filled 2016-08-20 (×4): qty 1

## 2016-08-20 NOTE — Progress Notes (Signed)
[facility] PHYSICAL MEDICINE & REHABILITATION     PROGRESS NOTE  Subjective/Complaints:  Pt seen laying in bed this AM.  She slept well overnight and states the only thing she needs this AM is orange juice.    ROS: Denies nausea, vomiting, diarrhea, shortness of breath or chest pain   Objective: Vital Signs: Blood pressure 118/78, pulse 61, temperature 98.2 F (36.8 C), temperature source Oral, resp. rate 18, height 5\' 5"  (1.651 m), weight 86 kg (189 lb 9.5 oz), SpO2 97 %. No results found. No results for input(s): WBC, HGB, HCT, PLT in the last 72 hours. No results for input(s): NA, K, CL, GLUCOSE, BUN, CREATININE, CALCIUM in the last 72 hours.  Invalid input(s): CO CBG (last 3)  No results for input(s): GLUCAP in the last 72 hours.  Wt Readings from Last 3 Encounters:  07/24/16 86 kg (189 lb 9.5 oz)  07/19/16 90.8 kg (200 lb 2.8 oz)  07/21/15 85.7 kg (189 lb)    Physical Exam:  BP 118/78 (BP Location: Right Arm)   Pulse 61   Temp 98.2 F (36.8 C) (Oral)   Resp 18   Ht 5\' 5"  (1.651 m)   Wt 86 kg (189 lb 9.5 oz)   SpO2 97%   BMI 31.55 kg/m  Constitutional: She appears well-developed. NAD. Obese  HENT: Normocephalic and atraumatic.  Eyes: EOM are normal. No discharge. Cardiovascular: RRR.No JVD. Respiratory: Clear bilaterally. unlabored.  GI: Soft. Bowel sounds are normal.   Musculoskeletal: She exhibits no tenderness. +Mild Edema, right foot.  Neurological: She is alert and oriented x3. Follows full commands.  Motor: LUE: 4+/5 proximal to distal LLE: 4+/5 HF,KE, 4+/5 ADF/PF RUE: 4+/5 proximal to distal RLE: HF 4-/5, KE 3+/5, ADF/PF 4-/5.  Skin: Skin is warm and dry.  Back incision intact Psychiatric: She has a normal mood and affect.   Assessment/Plan: 1. Functional deficits secondary to myelopathy which require 3+ hours per day of interdisciplinary therapy in a comprehensive inpatient rehab setting. Physiatrist is providing close team supervision and 24 hour  management of active medical problems listed below. Physiatrist and rehab team continue to assess barriers to discharge/monitor patient progress toward functional and medical goals.  Function:  Bathing Bathing position Bathing activity did not occur: Refused Position: Production manager parts bathed by patient: Right arm, Left arm, Chest, Front perineal area, Abdomen, Right upper leg, Left upper leg, Right lower leg, Left lower leg, Back Body parts bathed by helper: Buttocks  Bathing assist Assist Level: Supervision or verbal cues      Upper Body Dressing/Undressing Upper body dressing   What is the patient wearing?: Pull over shirt/dress     Pull over shirt/dress - Perfomed by patient: Thread/unthread right sleeve, Thread/unthread left sleeve, Put head through opening, Pull shirt over trunk Pull over shirt/dress - Perfomed by helper: Pull shirt over trunk        Upper body assist Assist Level: Supervision or verbal cues, Set up   Set up : To obtain clothing/put away  Lower Body Dressing/Undressing Lower body dressing   What is the patient wearing?: Pants, Shoes, [employer] - Performed by helper: Thread/unthread right underwear leg, Thread/unthread left underwear leg, Pull underwear up/down Pants- Performed by patient: Thread/unthread right pants leg, Thread/unthread left pants leg Pants- Performed by helper: Pull pants up/down   Non-skid slipper socks- Performed by helper: Don/doff right sock, Don/doff left sock Socks - Performed by patient: Don/doff right sock, Don/doff left sock  Socks - Performed by helper: Don/doff left sock, Don/doff right sock Shoes - Performed by patient: Don/doff right shoe Shoes - Performed by helper: Don/doff left shoe       TED Hose - Performed by helper: Don/doff left TED hose, Don/doff right TED hose  Lower body assist Assist for lower body dressing:  (Mod assist) Assistive Device Comment: reacher and shoe funnel     Toileting Toileting Toileting activity did not occur: No continent bowel/bladder event Toileting steps completed by patient: Adjust clothing prior to toileting, Adjust clothing after toileting (toileting simulation activity) Toileting steps completed by helper: Adjust clothing prior to toileting, Performs perineal hygiene, Adjust clothing after toileting Toileting Assistive Devices:  (RW; BSC)  Toileting assist Assist level: Touching or steadying assistance (Pt.75%)   Transfers Chair/bed transfer Chair/bed transfer activity did not occur: Safety/medical concerns Chair/bed transfer method: Squat pivot Chair/bed transfer assist level: Supervision or verbal cues Chair/bed transfer assistive device: Armrests Mechanical lift: Stedy   Locomotion Ambulation Ambulation activity did not occur: Safety/medical concerns   Max distance: 20' Assist level: 2 helpers (min to mod assist with +2 for w/c follow)   Wheelchair   Type: Manual Max wheelchair distance: 200' Assist Level: Supervision or verbal cues  Cognition Comprehension Comprehension assist level: Understands complex 90% of the time/cues 10% of the time  Expression Expression assist level: Expresses complex 90% of the time/cues < 10% of the time  Social Interaction Social Interaction assist level: Interacts appropriately with others with medication or extra time (anti-anxiety, antidepressant).  Problem Solving Problem solving assist level: Solves complex 90% of the time/cues < 10% of the time  Memory Memory assist level: Recognizes or recalls 90% of the time/requires cueing < 10% of the time    Medical Problem List and Plan: 1.  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 No brace required.   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    Per Neurosurg, repeat scan in 3 months.  2.  DVT Prophylaxis/Anticoagulation  Vascular study showing extensive DVT, including right iliac, but IVC appears to be clean  Continue Xarelto  3. Pain Management: Robaxin and oxycodone as needed.   Added lidoderm patch to right knee with benefit  -ice prn also is helpful 4. Mood: Paxil 20 mg daily, Provide emotional support 5. Neuropsych: This patient is capable of making decisions on her own behalf. 6. Skin/Wound Care: Routine skin checks 7. Fluids/Electrolytes/Nutrition: Routine I&Os  Remeron started 1/29,  Eating baseline, plan to wean as outpt  appreciate RD assistance 8. Hypertension. Norvasc 10 mg daily, Lopressor 100 mg twice a day.   Controlled 2/20 9. 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 10. History hypercoagulable state. INR 1.09. 11. Constipation. Laxative assistive 12. Hypokalemia: Resolved  K+ 4.4 on 2/16 13. Leukocytosis: Resolved  Afebrile 14. ABLA  Hb 10.3 on 2/16   FeSO4 and Vit C started 2/9  Cont to monitor 15. Acute lower UTI, E.Coli  Macrobid 2/14-2/20 16. Urinary retention  Trial Bethanechol 10 TID   LOS (Days) 27 A FACE TO FACE EVALUATION WAS PERFORMED  Ankit Lorie Phenix 08/20/2016 9:29 AM

## 2016-08-20 NOTE — Progress Notes (Signed)
Physical Therapy Session Note  Patient Details  Name: Katrina Donaldson MRN: [id number] Date of Birth: May 06, 1956  Today's Date: 08/20/2016 PT Individual Time: 1300-1415 PT Individual Time Calculation (min): 75 min   Short Term Goals: Week 4:  PT Short Term Goal 1 (Week 4): = LTGS   Skilled Therapeutic Interventions/Progress Updates: Pt presented in w/c agreeable to therapy. Pt stating feel urgency for void performed squat pivot transfer with modA, modA for doffing pants (- void). Pt attempted squat pivot transfer to w/c with maxA for clothes management. Pt with difficulty maintaining knee ext with total assist x2 transfer to w/c.  Attempted gait training with pt performed sit to stand from w/c with modA x2 and max cues for knee extension.  Pt required mod tactile max verbal cues for erect posture and decreased push through BUE. Pt able to performed sit to stand x3 however unable to initiate advancement of LE despite cues. Performed squat pivot transfer w/c to NuStep for LE strengthening and general conditioning. Performed NuStep L2 x 6 min 2 UE/2LE and x 6 min UE only. Pt propelled back to room with supervision and performed squat pivot transfer returning to bed with modA. Sit to supine with modA for LE placement. Pt left in bed with call bell within reach and needs met.      Therapy Documentation Precautions:  Precautions Precautions: Fall, Back Precaution Comments: recalled 2/3 back precautions Restrictions Weight Bearing Restrictions: No   See Function Navigator for Current Functional Status.   Therapy/Group: Individual Therapy  Harlee Pursifull  Aviyah Swetz, PTA  08/20/2016, 4:09 PM

## 2016-08-20 NOTE — Progress Notes (Signed)
Nutrition Follow-up  DOCUMENTATION CODES:   Obesity unspecified  INTERVENTION:  Provide Ensure Enlive po once daily, each supplement provides 350 kcal and 20 grams of protein.  Encourage adequate PO intake.   NUTRITION DIAGNOSIS:   Inadequate oral intake related to  (dislike of food at meals) as evidenced by per patient/family report, meal completion < 50%; improving  GOAL:   Patient will meet greater than or equal to 90% of their needs; met  MONITOR:   PO intake, Supplement acceptance, Labs, Weight trends, Skin, I & O's  REASON FOR ASSESSMENT:   Malnutrition Screening Tool    ASSESSMENT:   61 y.o. right handed female with history of hypertension, hypercoagulable state, [facility] 11/29/2014 requiring surgery in [facility].  Presented 07/19/2016 with gait disorder with decreased balance and some intermittent falls. 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 Underwent T11-L1 laminectomy, T4-T7 laminectomy, fenestration of arachnoid cyst 07/19/2016   Meal completion has been 75-100% with most intake recently at 100%. Intake has been adequate. Pt currently has Ensure ordered. RD to modify orders to once daily as intake has improved.   Diet Order:  Diet regular Room service appropriate? Yes; Fluid consistency: Thin  Skin:  Reviewed, no issues  Last BM:  2/19  Height:   Ht Readings from Last 1 Encounters:  07/24/16 _0  (1.651 m)    Weight:   Wt Readings from Last 1 Encounters:  07/24/16 189 lb 9.5 oz (86 kg)    Ideal Body Weight:  56.8 kg  BMI:  Body mass index is 31.55 kg/m.  Estimated Nutritional Needs:   Kcal:  1700-1900  Protein:  85-100 grams  Fluid:  1.7 - 1.9 L/day  EDUCATION NEEDS:   No education needs identified at this time  Katrina Parker, MS, RD, LDN Pager # [phone] After hours/ weekend pager #  [phone]

## 2016-08-20 NOTE — Progress Notes (Signed)
Speech Language Pathology Discharge Summary  Patient Details  Name: Katrina Donaldson MRN: [id number] Date of Birth: [birth year]  Today's Date: 08/20/2016 SLP Individual Time: 0174-9449 SLP Individual Time Calculation (min): 30 min   Skilled Therapeutic Interventions:   Skilled treatment session focused on addressing diagnostic treatment of cognition goals. Patient completed portions of the ALFA, Assessment of Language-Related Functional Activities, that she identified as activities that she did prior to admission.  Patient required Supervision level assist via question cues to accurately complete counting money, solving daily math problems, balancing a checkbook, and medication management tasks.  Patient able to identify that she will discharge home with one of her children, who will be assisting her as needed and that will include medication and money management.  Goals met at a Supervision level for complex tasks and education complete with patient.     Patient has met 6 of 6 long term goals.  Patient to discharge at overall Supervision level.  Reasons goals not met: n/a patient exceeded goals set and is overall Supervision    Clinical Impression/Discharge Summary:    Patient has made functional gains during this rehab admission and has met 6 out of 6 long term goals due to improved functional abilities.  Patient is currently an overall Supervision assist for complex  cognitive-linguistic tasks.  Patient education has been completed and patient will discharge home with assist with complex problem solving via son or daughter per her report.  If patient would like to return to managing her own finances and medications then outpatient SLP services are warranted for follow up.    Care Partner:  Caregiver Able to Provide Assistance: Yes  Type of Caregiver Assistance: Cognitive  Recommendation:  24 hour supervision/assistance;Outpatient SLP;Home Health SLP  Rationale for SLP Follow Up: Maximize  cognitive function and independence;Reduce caregiver burden   Equipment: none   Reasons for discharge: Treatment goals met   Patient/Family Agrees with Progress Made and Goals Achieved: Yes   Function:  Cognition Comprehension Comprehension assist level: Understands complex 90% of the time/cues 10% of the time  Expression   Expression assist level: Expresses complex 90% of the time/cues < 10% of the time  Social Interaction Social Interaction assist level: Interacts appropriately with others with medication or extra time (anti-anxiety, antidepressant).  Problem Solving Problem solving assist level: Solves complex 90% of the time/cues < 10% of the time  Memory Memory assist level: Recognizes or recalls 90% of the time/requires cueing < 10% of the time   Carmelia Roller., CCC-SLP [phone]  Duvall Comes 08/20/2016, 4:36 PM

## 2016-08-20 NOTE — Progress Notes (Signed)
Occupational Therapy Session Note  Patient Details  Name: Katrina Donaldson MRN: [id number] Date of Birth: 01/25/1956  Today's Date: 08/20/2016 OT Individual Time: 0900-1001 OT Individual Time Calculation (min): 61 min    Short Term Goals: Week 3:  OT Short Term Goal 1 (Week 3): STG = LTGs due to remaining LOS  Skilled Therapeutic Interventions/Progress Updates:    Pt supine in bed upon arrival and agreeable to tx. Pt reporting no pain this session. From flat surface, Pt supine> sitting EOB with supervision and VC to maintain back precations. Pt don pull over shirt at EOB with set up and thread BLE into pants with reacher with VC for technique. Pt don B non slip socks with sock aide with supervision and VC to persist pulling to advance sock over heel. Pt squat pivot transfer EOB>w/c with supervision and VC for hand placement throughout transfer. Pt STS from w/c with RW x8 with MOD A for lifting and VC for posture, pushing from armrests and anterior weight shifting. Pt attempts to use unilateral support on walker to advance pants over hips, however unable to maintain standing long enough. In steady pt STS with supervision and OT advances pants over hips. Pt returns to w/c and has all needs met and call light in reach upon exiting session.  Therapy Documentation Precautions:  Precautions Precautions: Fall, Back Precaution Comments: recalled 2/3 back precautions Restrictions Weight Bearing Restrictions: No General:   Vital Signs:   Pain:   ADL: ADL ADL Comments: refer to functional navigator Exercises:   Other Treatments:    See Function Navigator for Current Functional Status.   Therapy/Group: Individual Therapy  Tonny Branch 08/20/2016, 2:17 PM

## 2016-08-20 NOTE — Progress Notes (Signed)
Speech Language Pathology Daily Session Note  Patient Details  Name: Nyeasha Volkert MRN: [id number] Date of Birth: February 12, 1956  Today's Date: 08/20/2016 SLP Individual Time: 1100-1130 SLP Individual Time Calculation (min): 30 min  Short Term Goals: Week 3: SLP Short Term Goal 1 (Week 3): Patient will recall new, daily information with supervision verbal cues.  SLP Short Term Goal 2 (Week 3): Patient will demonstrate functional problem solving for mildly complex tasks with supervision verbal cues.  SLP Short Term Goal 3 (Week 3): Patient will demosntrate selective attention to a functional task in a moderately distracting enviornment for 30 minutes with supervision verbal cues.   Skilled Therapeutic Interventions: Skilled treatment session focused on cognitive goals. Patient was Mod I for selective attention in a mildly distracting environment for 30 minutes and for recall of functional information. Patient also required supervision verbal cues for complex problem solving in regards to d/c planning. Patient left upright in wheelchair with all needs within reach. Continue with current plan of care.      Function:  Cognition Comprehension Comprehension assist level: Understands complex 90% of the time/cues 10% of the time  Expression   Expression assist level: Expresses complex 90% of the time/cues < 10% of the time  Social Interaction Social Interaction assist level: Interacts appropriately with others with medication or extra time (anti-anxiety, antidepressant).  Problem Solving Problem solving assist level: Solves complex 90% of the time/cues < 10% of the time  Memory Memory assist level: Recognizes or recalls 90% of the time/requires cueing < 10% of the time    Pain No/Denies Pain   Therapy/Group: Individual Therapy  Kylil Swopes 08/20/2016, 3:45 PM

## 2016-08-20 NOTE — Progress Notes (Signed)
Unable to void. Up twice since shift to attempt to void at approximately 2030 and at 2330 with no success. Bladder scan and I/O cath performed.

## 2016-08-21 ENCOUNTER — Inpatient Hospital Stay (HOSPITAL_COMMUNITY): Payer: BC Managed Care – PPO | Admitting: Physical Therapy

## 2016-08-21 ENCOUNTER — Inpatient Hospital Stay (HOSPITAL_COMMUNITY): Payer: BC Managed Care – PPO

## 2016-08-21 MED ORDER — BETHANECHOL CHLORIDE 25 MG PO TABS
25.0000 mg | ORAL_TABLET | Freq: Three times a day (TID) | ORAL | Status: DC
  Administered 2016-08-21 – 2016-08-22 (×3): 25 mg via ORAL
  Filled 2016-08-21 (×3): qty 1

## 2016-08-21 NOTE — Progress Notes (Signed)
Physical Therapy Session Note  Patient Details  Name: Katrina Donaldson MRN: [id number] Date of Birth: 1956/05/06  Today's Date: 08/21/2016 PT Individual Time: 1605-1701 PT Individual Time Calculation (min): 56 min   Skilled Therapeutic Interventions/Progress Updates:    Pt received in bed & agreeable to tx. Pt reports ache in L shoulder but already has hot pack applied & reports being premedicated. Pt transferred supine>sitting EOB with supervision assist, use of bed rails & HOB elevated. Therapist retrieved shoes & reacher & pt then able to don shoes. Pt attempts to scoot bed>w/c but unable to clear buttocks and pushes w/c farther away from her; with therapist instruction pt transferred bed>w/c with mod assist via squat pivot. Pt propelled w/c room<>gym with BUE & supervision assist for endurance training. In gym activities focused on BLE strengthening and overall activity tolerance. Pt transferred sit<>stand with mod assist and able to stand 1 minute 40 seconds + 30 seconds with BUE support. Pt fearful to attempt standing without BUE support. Pt then performed mini squats with cuing for technique; pt experienced BLE buckling and returned to sitting in w/c but after resting pt able to continue activity. Pt then performed BLE long arc quads with 3 second hold with 5 pound weights on BLE; therapist provided cuing for technique. Back in room pt attempting to transfer w/c>toilet with use of grab bars but unable to clear buttocks from w/c seat 2/2 BLE fatigue therefore stedy lift used to transfer pt w/c>elevated toilet. At end of session pt left sitting on toilet with call bell in reach & instructions to call for nursing when finished; RN made aware.  Pt requires cuing for head/hips relationship to increase ease of sit>stand transfer as pt tends to extend posteriorly. Pt able to recall 3/3 back precautions without cuing during session.  Therapy Documentation Precautions:  Precautions Precautions: Fall,  Back Precaution Comments: recalled 2/3 back precautions Restrictions Weight Bearing Restrictions: No   See Function Navigator for Current Functional Status.   Therapy/Group: Individual Therapy  Waunita Schooner 08/21/2016, 5:08 PM

## 2016-08-21 NOTE — Progress Notes (Signed)
[facility] PHYSICAL MEDICINE & REHABILITATION     PROGRESS NOTE  Subjective/Complaints:  Pt seen laying in bed this AM.  She slept well overnight.  She has concerns about her urinary retention.   ROS: +Urinary retention. Denies nausea, vomiting, diarrhea, shortness of breath or chest pain   Objective: Vital Signs: Blood pressure 128/68, pulse 62, temperature 98.2 F (36.8 C), temperature source Oral, resp. rate 18, height 5\' 5"  (1.651 m), weight 86 kg (189 lb 9.5 oz), SpO2 99 %. No results found. No results for input(s): WBC, HGB, HCT, PLT in the last 72 hours. No results for input(s): NA, K, CL, GLUCOSE, BUN, CREATININE, CALCIUM in the last 72 hours.  Invalid input(s): CO CBG (last 3)  No results for input(s): GLUCAP in the last 72 hours.  Wt Readings from Last 3 Encounters:  07/24/16 86 kg (189 lb 9.5 oz)  07/19/16 90.8 kg (200 lb 2.8 oz)  07/21/15 85.7 kg (189 lb)    Physical Exam:  BP 128/68 (BP Location: Left Arm)   Pulse 62   Temp 98.2 F (36.8 C) (Oral)   Resp 18   Ht 5\' 5"  (1.651 m)   Wt 86 kg (189 lb 9.5 oz)   SpO2 99%   BMI 31.55 kg/m  Constitutional: She appears well-developed. NAD. Obese  HENT: Normocephalic and atraumatic.  Eyes: EOM are normal. No discharge. Cardiovascular: RRR. No JVD. Respiratory: Clear bilaterally. Unlabored.  GI: Soft. Bowel sounds are normal.   Musculoskeletal: She exhibits no tenderness. +Mild Edema, right foot.  Neurological: She is alert and oriented x3. Follows full commands.  Motor: LUE: 4+/5 proximal to distal LLE: 4+/5 HF,KE, 4+/5 ADF/PF RUE: 4+/5 proximal to distal RLE: HF 4-/5, KE 3+/5, ADF/PF 4-/5. (stable) Skin: Skin is warm and dry.  Back incision intact Psychiatric: She has a normal mood and affect.   Assessment/Plan: 1. Functional deficits secondary to myelopathy which require 3+ hours per day of interdisciplinary therapy in a comprehensive inpatient rehab setting. Physiatrist is providing close team supervision  and 24 hour management of active medical problems listed below. Physiatrist and rehab team continue to assess barriers to discharge/monitor patient progress toward functional and medical goals.  Function:  Bathing Bathing position Bathing activity did not occur: Refused Position: Wheelchair/chair at sink  Bathing parts Body parts bathed by patient: Right arm, Left arm, Chest, Abdomen Body parts bathed by helper: Buttocks  Bathing assist Assist Level: Set up   Set up : To obtain items  Upper Body Dressing/Undressing Upper body dressing   What is the patient wearing?: Pull over shirt/dress     Pull over shirt/dress - Perfomed by patient: Thread/unthread right sleeve, Thread/unthread left sleeve, Put head through opening, Pull shirt over trunk Pull over shirt/dress - Perfomed by helper: Pull shirt over trunk        Upper body assist Assist Level: Set up   Set up : To obtain clothing/put away  Lower Body Dressing/Undressing Lower body dressing   What is the patient wearing?: Pants, [employer], Socks   Underwear - Performed by helper: Thread/unthread right underwear leg, Thread/unthread left underwear leg, Pull underwear up/down Pants- Performed by patient: Thread/unthread right pants leg, Thread/unthread left pants leg, Pull pants up/down Pants- Performed by helper: Pull pants up/down   Non-skid slipper socks- Performed by helper: Don/doff right sock, Don/doff left sock Socks - Performed by patient: Don/doff right sock, Don/doff left sock Socks - Performed by helper: Don/doff left sock, Don/doff right sock Shoes - Performed by  patient: Don/doff right shoe, Don/doff left shoe Shoes - Performed by helper: Don/doff left shoe       TED Hose - Performed by helper: Don/doff right TED hose, Don/doff left TED hose  Lower body assist Assist for lower body dressing: Touching or steadying assistance (Pt > 75%) Assistive Device Comment: sock aide and reacher    Toileting Toileting  Toileting activity did not occur: No continent bowel/bladder event Toileting steps completed by patient: Adjust clothing prior to toileting, Adjust clothing after toileting (toileting simulation activity) Toileting steps completed by helper: Adjust clothing prior to toileting, Performs perineal hygiene, Adjust clothing after toileting Toileting Assistive Devices:  (RW; BSC)  Toileting assist Assist level: Touching or steadying assistance (Pt.75%)   Transfers Chair/bed transfer Chair/bed transfer activity did not occur: Safety/medical concerns Chair/bed transfer method: Squat pivot Chair/bed transfer assist level: Set up only Chair/bed transfer assistive device: Armrests Mechanical lift: Stedy   Locomotion Ambulation Ambulation activity did not occur: Safety/medical concerns   Max distance: 13' Assist level: 2 helpers (min to mod assist with +2 for w/c follow)   Wheelchair   Type: Manual Max wheelchair distance: 200' Assist Level: Supervision or verbal cues  Cognition Comprehension Comprehension assist level: Understands complex 90% of the time/cues 10% of the time  Expression Expression assist level: Expresses complex 90% of the time/cues < 10% of the time  Social Interaction Social Interaction assist level: Interacts appropriately 90% of the time - Needs monitoring or encouragement for participation or interaction.  Problem Solving Problem solving assist level: Solves complex 90% of the time/cues < 10% of the time  Memory Memory assist level: Recognizes or recalls 90% of the time/requires cueing < 10% of the time    Medical Problem List and Plan: 1.  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 No brace required.   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    Per Neurosurg, repeat scan in 3 months.  2.  DVT Prophylaxis/Anticoagulation  Vascular study showing extensive DVT, including right iliac, but IVC appears to be clean  Continue Xarelto  3. Pain Management: Robaxin and oxycodone as needed.   Added lidoderm patch to right knee with benefit  -ice prn also is helpful 4. Mood: Paxil 20 mg daily, Provide emotional support 5. Neuropsych: This patient is capable of making decisions on her own behalf. 6. Skin/Wound Care: Routine skin checks 7. Fluids/Electrolytes/Nutrition: Routine I&Os  Remeron started 1/29,  Eating baseline, plan to wean as outpt  appreciate RD assistance 8. Hypertension. Norvasc 10 mg daily, Lopressor 100 mg twice a day.   Controlled 2/21 9. 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 10. History hypercoagulable state. INR 1.09. 11. Constipation. Laxative assistive 12. Hypokalemia: Resolved  K+ 4.4 on 2/16 13. Leukocytosis: Resolved  Afebrile 14. ABLA  Hb 10.3 on 2/16   FeSO4 and Vit C started 2/9  Cont to monitor 15. Acute lower UTI, E.Coli  Macrobid completed 2/14-2/20 16. Urinary retention  Trial Bethanechol 10 TID, increased to 25 on 2/21   LOS (Days) 28 A FACE TO FACE EVALUATION WAS PERFORMED  Rayhana Slider Lorie Phenix 08/21/2016 1:27 PM

## 2016-08-21 NOTE — Progress Notes (Signed)
Occupational Therapy Session Note  Patient Details  Name: Katrina Donaldson MRN: [id number] Date of Birth: 1955-10-26  Today's Date: 08/21/2016 OT Individual Time:  9:00- 10:00   60 min   Short Term Goals: Week 3:  OT Short Term Goal 1 (Week 3): STG = LTGs due to remaining LOS  Skilled Therapeutic Interventions/Progress Updates:    Pt seen in bed reporting 4/10 shoulder pain but RN already aware. Pt agreeable to tx. Focus of session on ADL retraining and functional mobility. Pt supine>EOB>w/c with SET UP for w/c placement and safety. Pt bathes UB and grooms at sink with supervision/set up to obtain items and place w/c at a distance to facilitate anterior trunk flexion. Pt doff/dons pull over shirt with set up to obtain items at w/c level. Pt threads BLE into pants with supervision for reacher technique. With sock aide, Pt dons B socks into feet and in seated figure 4 pt dons B shoes with supervision for technique. Pt STS with RW with MOD A for lifting and VC for posture, foot positioning and  anterior weight shifting to advance pants over hips. Pt practices 2x STS with RW from w/c with MIN-MOD A for lifting and balance. Pt left in room with call light in reach and all needs met.   Therapy Documentation Precautions:  Precautions Precautions: Fall, Back Precaution Comments: recalled 2/3 back precautions Restrictions Weight Bearing Restrictions: No General:   Vital Signs: Therapy Vitals Temp: 98.2 F (36.8 C) Temp Source: Oral Pulse Rate: 62 Resp: 18 BP: 128/68 Patient Position (if appropriate): Lying Oxygen Therapy SpO2: 99 % O2 Device: Not Delivered Pain:   ADL: ADL ADL Comments: refer to functional navigator Exercises:   Other Treatments:    See Function Navigator for Current Functional Status.   Therapy/Group: Individual Therapy  Tonny Branch 08/21/2016, 9:16 AM

## 2016-08-21 NOTE — Progress Notes (Signed)
Physical Therapy Session Note  Patient Details  Name: Katrina Donaldson MRN: [id number] Date of Birth: Dec 24, 1955  Today's Date: 08/21/2016 PT Individual Time: 1100-1155 and 1345-1415 PT Individual Time Calculation (min): 55 min and 30 min  Short Term Goals: Week 4:  PT Short Term Goal 1 (Week 4): = LTGS   Skilled Therapeutic Interventions/Progress Updates: Tx1: Pt presented in w/c agreeable to therapy. C/o increased L shoulder pain this am, per pt premedicated and was presently receiving hot pack. Participated in seated LE therex as pt request no squat pivot transfer due to L shoulder pain. Pt performed LAQ,, hip flexion, hip abd 2 x10 with 1/5lb weights. HS pulls with Level 2 resistance band and ball squeezes. Pt also used Kinetron 50cm/sec from w/c x 62mn continuously. Pt required breaks between activities due to fatigue. Pt propelled approx 1076fwith pt stating no significant increase in pain. Pt returned to room and heat pad placed on shoulder. Pt left in w/c with call bell within reach and needs met.   Tx2: Pt presented in w/c minimal decrease in L shoulder pain (5/10). Propelled with LE only 5081f 2 for HS strengthening. Pt required mod cues for pulling through with RLE which pt was able to perform approx 50% of time. Pt returned to room and performed squat pivot transfer with min guard to toilet. Pt let on toilet for timed toileting with NT notified pending I/O cath.      Therapy Documentation Precautions:  Precautions Precautions: Fall, Back Precaution Comments: recalled 2/3 back precautions Restrictions Weight Bearing Restrictions: No General:   Vital Signs:   Pain: Pain Assessment Pain Assessment: 0-10 Pain Score: 5  Pain Location: Shoulder Pain Orientation: Left Pain Descriptors / Indicators: Aching    See Function Navigator for Current Functional Status.   Therapy/Group: Individual Therapy  Duward Allbritton  Yui Mulvaney, PTA  08/21/2016, 12:15 PM

## 2016-08-22 ENCOUNTER — Inpatient Hospital Stay (HOSPITAL_COMMUNITY): Payer: BC Managed Care – PPO | Admitting: Occupational Therapy

## 2016-08-22 ENCOUNTER — Inpatient Hospital Stay (HOSPITAL_COMMUNITY): Payer: BC Managed Care – PPO

## 2016-08-22 MED ORDER — BETHANECHOL CHLORIDE 25 MG PO TABS
50.0000 mg | ORAL_TABLET | Freq: Three times a day (TID) | ORAL | Status: DC
  Administered 2016-08-22 – 2016-08-24 (×7): 50 mg via ORAL
  Filled 2016-08-22 (×7): qty 2

## 2016-08-22 NOTE — Progress Notes (Signed)
Physical Therapy Session Note  Patient Details  Name: Katrina Donaldson MRN: [id number] Date of Birth: January 02, 1956  Today's Date: 08/22/2016 PT Individual Time: 1300-1400 PT Individual Time Calculation (min): 60 min    Skilled Therapeutic Interventions/Progress Updates:    session focused on w/c mobility with focus on efficient technique in long hallways and tight spaces for home environment mobility. Practiced squat pivot transfers to elevated bed in ADL apartment x 2 repetitions with cues for technique and hand placement, as well as assist with w/c parts management and sequencing. Pt required min to mod assist with repetition. Simulated car transfer with squat pivot technique with min assist using grab bar. Pt refused to attempt with slideboard and states her son would be able to help her with lifting assist. Recommended for her to discuss with son to use his wife's car which is a sedan over an SUV.   Therapy Documentation Precautions:  Precautions Precautions: Fall, Back Precaution Comments: recalled 3/3 back precautions Restrictions Weight Bearing Restrictions: No Pain:    See Function Navigator for Current Functional Status.   Therapy/Group: Individual Therapy  Canary Brim Ivory Broad, PT, DPT  08/22/2016, 2:50 PM

## 2016-08-22 NOTE — Plan of Care (Signed)
Problem: RH PAIN MANAGEMENT Goal: RH STG PAIN MANAGED AT OR BELOW PT'S PAIN GOAL Pain < or =3 with min assistance  Outcome: Progressing Heating pad used to help aide in pain management.

## 2016-08-22 NOTE — Progress Notes (Signed)
Katrina Donaldson PHYSICAL MEDICINE & REHABILITATION     PROGRESS NOTE  Subjective/Complaints:  Pt seen laying in bed this AM.  She slept well overnight.  She states she has been communicating with her daughter via text.  Social worker unable to contact daughter.   ROS: +Urinary retention. Denies nausea, vomiting, diarrhea, shortness of breath or chest pain   Objective: Vital Signs: Blood pressure 134/74, pulse 64, temperature 98.4 F (36.9 C), temperature source Oral, resp. rate 18, height 5\' 5"  (1.651 m), weight 86 kg (189 lb 9.5 oz), SpO2 98 %. No results found. No results for input(s): WBC, HGB, HCT, PLT in the last 72 hours. No results for input(s): NA, K, CL, GLUCOSE, BUN, CREATININE, CALCIUM in the last 72 hours.  Invalid input(s): CO CBG (last 3)  No results for input(s): GLUCAP in the last 72 hours.  Wt Readings from Last 3 Encounters:  07/24/16 86 kg (189 lb 9.5 oz)  07/19/16 90.8 kg (200 lb 2.8 oz)  07/21/15 85.7 kg (189 lb)    Physical Exam:  BP 134/74   Pulse 64   Temp 98.4 F (36.9 C) (Oral)   Resp 18   Ht 5\' 5"  (1.651 m)   Wt 86 kg (189 lb 9.5 oz)   SpO2 98%   BMI 31.55 kg/m  Constitutional: She appears well-developed. NAD. Obese  HENT: Normocephalic and atraumatic.  Eyes: EOM are normal. No discharge. Cardiovascular: RRR. No JVD. Respiratory: Clear bilaterally. Unlabored.  GI: Soft. Bowel sounds are normal.   Musculoskeletal: She exhibits no tenderness. +Mild Edema, right foot.  Neurological: She is alert and oriented x3. Follows full commands.  Motor: LUE: 4+/5 proximal to distal LLE: 4+/5 HF,KE, 4+/5 ADF/PF RUE: 4+/5 proximal to distal RLE: HF 4-/5, KE 3+/5, ADF/PF 4-/5. (unchanged) Skin: Skin is warm and dry.  Back incision intact Psychiatric: She has a normal mood and affect.   Assessment/Plan: 1. Functional deficits secondary to myelopathy which require 3+ hours per day of interdisciplinary therapy in a comprehensive inpatient rehab  setting. Physiatrist is providing close team supervision and 24 hour management of active medical problems listed below. Physiatrist and rehab team continue to assess barriers to discharge/monitor patient progress toward functional and medical goals.  Function:  Bathing Bathing position Bathing activity did not occur: Refused Position: Wheelchair/chair at sink  Bathing parts Body parts bathed by patient: Right arm, Left arm, Chest, Abdomen Body parts bathed by helper: Buttocks  Bathing assist Assist Level: Set up   Set up : To obtain items  Upper Body Dressing/Undressing Upper body dressing   What is the patient wearing?: Pull over shirt/dress     Pull over shirt/dress - Perfomed by patient: Thread/unthread right sleeve, Thread/unthread left sleeve, Put head through opening, Pull shirt over trunk Pull over shirt/dress - Perfomed by helper: Pull shirt over trunk        Upper body assist Assist Level: Set up   Set up : To obtain clothing/put away  Lower Body Dressing/Undressing Lower body dressing   What is the patient wearing?: Pants, [employer], Socks   Underwear - Performed by helper: Thread/unthread right underwear leg, Thread/unthread left underwear leg, Pull underwear up/down Pants- Performed by patient: Thread/unthread right pants leg, Thread/unthread left pants leg, Pull pants up/down Pants- Performed by helper: Pull pants up/down   Non-skid slipper socks- Performed by helper: Don/doff right sock, Don/doff left sock Socks - Performed by patient: Don/doff right sock, Don/doff left sock Socks - Performed by helper: Don/doff left sock,  Don/doff right sock Shoes - Performed by patient: Don/doff right shoe, Don/doff left shoe Shoes - Performed by helper: Don/doff left shoe       TED Hose - Performed by helper: Don/doff right TED hose, Don/doff left TED hose  Lower body assist Assist for lower body dressing: Touching or steadying assistance (Pt > 75%) Assistive Device  Comment: sock aide and reacher    Toileting Toileting Toileting activity did not occur: No continent bowel/bladder event Toileting steps completed by patient: Adjust clothing prior to toileting, Adjust clothing after toileting (toileting simulation activity) Toileting steps completed by helper: Adjust clothing prior to toileting Toileting Assistive Devices:  (RW; BSC)  Toileting assist Assist level: Two helpers   Transfers Chair/bed transfer Chair/bed transfer activity did not occur: Safety/medical concerns Chair/bed transfer method: Squat pivot Chair/bed transfer assist level: Moderate assist (Pt 50 - 74%/lift or lower) Chair/bed transfer assistive device: Armrests Mechanical lift: Stedy   Locomotion Ambulation Ambulation activity did not occur: Safety/medical concerns   Max distance: 11' Assist level: 2 helpers (min to mod assist with +2 for w/c follow)   Wheelchair   Type: Manual Max wheelchair distance: 150 ft Assist Level: Supervision or verbal cues  Cognition Comprehension Comprehension assist level: Understands complex 90% of the time/cues 10% of the time  Expression Expression assist level: Expresses complex 90% of the time/cues < 10% of the time  Social Interaction Social Interaction assist level: Interacts appropriately 90% of the time - Needs monitoring or encouragement for participation or interaction.  Problem Solving Problem solving assist level: Solves complex 90% of the time/cues < 10% of the time  Memory Memory assist level: Recognizes or recalls 90% of the time/requires cueing < 10% of the time    Medical Problem List and Plan: 1.  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 No brace required.   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    Per Neurosurg, repeat scan in 3 months.  2.  DVT Prophylaxis/Anticoagulation  Vascular study showing extensive DVT, including right iliac, but IVC appears to be clean  Continue Xarelto  3. Pain Management: Robaxin and oxycodone as needed.   Added lidoderm patch to right knee with benefit  -ice prn also is helpful 4. Mood: Paxil 20 mg daily, Provide emotional support 5. Neuropsych: This patient is capable of making decisions on her own behalf. 6. Skin/Wound Care: Routine skin checks 7. Fluids/Electrolytes/Nutrition: Routine I&Os  Remeron started 1/29,  Eating baseline, plan to wean as outpt  appreciate RD assistance 8. Hypertension. Norvasc 10 mg daily, Lopressor 100 mg twice a day.   Controlled 2/22 9. 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 10. History hypercoagulable state. INR 1.09. 11. Constipation. Laxative assistive 12. Hypokalemia: Resolved  K+ 4.4 on 2/16 13. Leukocytosis: Resolved  Afebrile 14. ABLA  Hb 10.3 on 2/16   FeSO4 and Vit C started 2/9  Cont to monitor 15. Acute lower UTI, E.Coli  Macrobid completed 2/14-2/20 16. Urinary retention  Improving  Trial Bethanechol 10 TID, increased to 25 on 2/21, increased to 50 on 2/22   LOS (Days) 29 A FACE TO FACE EVALUATION WAS PERFORMED  Katrina Donaldson Katrina Donaldson 08/22/2016 12:54 PM

## 2016-08-22 NOTE — Patient Care Conference (Signed)
Inpatient RehabilitationTeam Conference and Plan of Care Update Date: 08/21/2016   Time: 2:30 PM    Patient Name: Katrina Donaldson      Medical Record Number: [id number]  Date of Birth: Feb 13, 1956 Sex: Female         Room/Bed: 4W25C/4W25C-01 Payor Info: Payor: [place] / Plan: [facility] PPO / Product Type: *No Product type* /    Admitting Diagnosis: Spinal Cyst  Admit Date/Time:  07/24/2016  6:34 PM Admission Comments: No comment available   Primary Diagnosis:  <principal problem not specified> Principal Problem: <principal problem not specified>  Patient Active Problem List   Diagnosis Date Noted  . Urinary retention   . Acute pain of right knee   . Acute lower UTI   . Weakness of right lower extremity   . Confusion   . Hydrocephalus   . Poor appetite   . Acute blood loss anemia   . Post-operative pain   . Hypokalemia   . Leukocytosis   . Thrombocytopenia ([place])   . Acute deep vein thrombosis (DVT) of femoral vein of left lower extremity ([place])   . Myelopathy ([place]) 07/24/2016  . Thoracic myelopathy   . Depression   . Benign essential HTN   . History of subarachnoid hemorrhage   . Hypercoagulable state ([place])   . Constipation due to pain medication   . 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 Protein-calorie malnutrition ([place]) 12/21/2014  . Thyroid activity decreased 12/21/2014  . 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 Essential hypertension 12/13/2014  . Anemia, iron deficiency 12/13/2014  . Hypothyroidism 12/13/2014  . Prediabetes 12/13/2014    Expected Discharge Date: Expected Discharge Date: 08/24/16 (SNF)  Team Members Present: Physician leading conference: Dr. 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 LBM 08/19/16. Pt requiring I&O cath q 8hrs to promote emptying  Manage bladder program  Pt up to toilet for voiding trials. Continue PVR's and catherization if needed   Swallow/Nutrition/ Hydration             ADL's   Supervision for bed mobility and supervision-CGA for squat pivot transfer to [facility], MIN-MOD A standing balance, LB self care with AE supervision with A to STS  min A overall, supervision/set up for UB dressing  self care retraining  with AE, STS, balance, functional transfers, pt education, d/c planning   Mobility   maxA sit/stand, modA gait 55ft, modA squat pivot and car transfers  MinA functional mobilty   squat pivot transfer and car transfers, gait, LE stregthening    Communication             Safety/Cognition/ Behavioral Observations            Pain   No c/o pain  <4  Monitor for nonverbal cues of pain   Skin   Incisional site healing appropriately  No new skin breakdown  Assess skin q shift    Rehab Goals Patient on target to meet rehab goals: Yes *See  Care Plan and progress notes for long and short-term goals.  Barriers to Discharge: mobility, DVT, urinary retention    Possible Resolutions to Barriers:  Therapies, DVT treatment, detrusor relaxors    Discharge Planning/Teaching Needs:  Plan is for pt to d/c home with family providing care, however, daughter not responding to any attempts by SW to reach her this week.    TBD   Team Discussion:  meds started for urinary retention but still I/O caths at times.  Supervision w/c level except min/ mod assit to stand.  Squat -pivot to bsc with supervision.  Goals still  supervision with w/c mobility and min/ mod assist gait `20 .  Can still fluctuate and overall goals for min assist.  SW reports that she has made multiple attempts to get daughter to respond with confirmed d/c plan with no success - continue to work on this and will keep team posted.  Revisions to Treatment Plan:  None   Continued Need for Acute Rehabilitation Level of Care: The patient requires daily medical management by a physician with specialized training in physical medicine and rehabilitation for the following conditions: Daily direction of a multidisciplinary physical rehabilitation program to ensure safe treatment while eliciting the highest outcome that is of practical value to the patient.: Yes Daily medical management of patient stability for increased activity during participation in an intensive rehabilitation regime.: Yes Daily analysis of laboratory values and/or radiology reports with any subsequent need for medication adjustment of medical intervention for : Neurological problems;Urological problems  Werner Labella 08/22/2016, 9:22 AM

## 2016-08-22 NOTE — Progress Notes (Signed)
Occupational Therapy Session Note  Patient Details  Name: Shikia Edouard MRN: [id number] Date of Birth: 02/26/1956  Today's Date: 08/22/2016 OT Individual Time: [id number] OT Individual Time Calculation (min): 43 min    Short Term Goals: Week 3:  OT Short Term Goal 1 (Week 3): STG = LTGs due to remaining LOS  Skilled Therapeutic Interventions/Progress Updates:    Treatment session with focus on LB dressing tasks.  Pt able to don TEDS with provision of small plastic bag to decrease friction.  Pt crossed RLE over Lt knee and donned TED stocking with increased time and cues for technique.  Pt donned socks with use of sock aid as well as shoe by crossing leg over opposite knee.  Pt demonstrating decreased ability to cross LLE over Rt knee without increase in pain in back.  Discussed use of AE to increase independence with LB dressing as well as engaged in discussion regarding d/c plan and focus of remaining therapy sessions.  Therapy Documentation Precautions:  Precautions Precautions: Fall, Back Precaution Comments: recalled 2/3 back precautions Restrictions Weight Bearing Restrictions: No Pain:  Pt reports pain 5/10 in lower back with activity, repositioned and pain returned to 0.  See Function Navigator for Current Functional Status.   Therapy/Group: Individual Therapy  Simonne Come 08/22/2016, 3:20 PM

## 2016-08-22 NOTE — Plan of Care (Signed)
Problem: RH Balance Goal: LTG Patient will maintain dynamic standing balance (PT) LTG:  Patient will maintain dynamic standing balance with assistance during mobility activities (PT)  Outcome: Not Applicable Date Met: 27/55/62 D/c due to not consistent for functional tasks. ABG

## 2016-08-22 NOTE — Progress Notes (Signed)
Occupational Therapy Session Note  Patient Details  Name: Katrina Donaldson MRN: [id number] Date of Birth: 08/02/1955  Today's Date: 08/22/2016 OT Individual Time: [id number] OT Individual Time Calculation (min): 58 min    Short Term Goals: Week 3:  OT Short Term Goal 1 (Week 3): STG = LTGs due to remaining LOS  Skilled Therapeutic Interventions/Progress Updates:    Upon entering the room, pt seated on elevated toilet seat having BM with RN present in the room. Pt with no c/o pain this session. Pt utilized reacher to thread clothing onto B LE's. Pt standing with min A for balance to pull pants over B hips. Stedy utilized to return to wheelchair this session. Pt declined UB bathing but donned shirt with set up A. Pt seated in wheelchair for grooming at sink. Pt remained seated in wheelchair and breakfast tray placed in front. Call bell within reach upon exiting the room.   Therapy Documentation Precautions:  Precautions Precautions: Fall, Back Precaution Comments: recalled 2/3 back precautions Restrictions Weight Bearing Restrictions: No General:   Vital Signs:   Pain:   ADL: ADL ADL Comments: refer to functional navigator Exercises:   Other Treatments:    See Function Navigator for Current Functional Status.   Therapy/Group: Individual Therapy  Gypsy Decant 08/22/2016, 8:00 PM

## 2016-08-22 NOTE — Progress Notes (Signed)
Social Work Patient ID: Katrina Donaldson, female   DOB: [birth year], 61 y.o.   MRN: [id number]  Have made daily attempts to reach pt's daughter, Katrina Donaldson, to confirm d/c care/ plan.  Each time leaving messages to call me ASAP.  Have explained to pt that daughter not returning call and that this is crucial as insurance has only covered her CIR stay through 2/24.  Pt surprised and states, "She told me yesterday that she was gonna call you."  I have also left messages for pt's son.  Pt to continue to try and reach daughter today and stress urgency.  Pt then reports, "My daughter is trying to find me an apartment because I just can't get around my place."   ................................................Marland Kitchen  During this time, pt's son, Katrina Donaldson returned call and reported "We've got all that taken care of.  We have an apartment we're moving her into and one of her friends is going to take care of her."  (!!)  Explained to him that they must keep [place] informed of these details as I need to have caregiver come in for education and the only available day now is tomorrow.  I need contact information for caregiver AND I need address of d/c location so I arrange f/u [facility] services.  He states understanding and says that his sister has all the info and he will continue to try to reach her.  Stay tuned!  Ott Zimmerle, LCSW

## 2016-08-22 NOTE — Progress Notes (Signed)
Physical Therapy Session Note  Patient Details  Name: Katrina Donaldson MRN: [id number] Date of Birth: [birth year]  Today's Date: 08/22/2016 PT Individual Time: 1105-1130 PT Individual Time Calculation (min): 25 min   Skilled Therapeutic Interventions/Progress Updates:    Pt attempting to reach her daughter to discuss discharge plans via text. Pt unaware of any plans for her location of discharge and denies any concerns physically about upcoming d/c on Saturday but is questioning how she will be able to be cathed. Discussed with patient the importance of hands on training with unnamed caregiver as pt requires hands on assist fluctuating from min to mod assist. Also discussed DME recommended and also possibly recommending slideboard due to inconsistency as well as unknown level of physical assist this "caregiver" could provide as this allows pt to increase independence with mobility. Pt working on donning shoes from w/c level with figure four technique to maintain back precautions but due to socks, pt required assist and extra time. Planning to practice regular bed and car transfer this PM session to continue to prepare for discharge.   Therapy Documentation Precautions:  Precautions Precautions: Fall, Back Precaution Comments: recalled 3/3 back precautions Restrictions Weight Bearing Restrictions: No   Pain:  No complaints. Reports heating pad is working well for her.    See Function Navigator for Current Functional Status.   Therapy/Group: Individual Therapy  Canary Brim Ivory Broad, PT, DPT  08/22/2016, 12:15 PM

## 2016-08-23 ENCOUNTER — Inpatient Hospital Stay (HOSPITAL_COMMUNITY): Payer: BC Managed Care – PPO

## 2016-08-23 ENCOUNTER — Encounter (HOSPITAL_COMMUNITY): Payer: BC Managed Care – PPO | Admitting: Occupational Therapy

## 2016-08-23 MED ORDER — BETHANECHOL CHLORIDE 25 MG PO TABS: ORAL_TABLET | ORAL | 0 refills | Status: DC

## 2016-08-23 MED ORDER — LIDOCAINE 5 % EX PTCH: 1.0000 | MEDICATED_PATCH | CUTANEOUS | 0 refills | Status: DC

## 2016-08-23 MED ORDER — METOPROLOL TARTRATE 100 MG PO TABS: 100.0000 mg | ORAL_TABLET | Freq: Two times a day (BID) | ORAL | 0 refills | Status: DC

## 2016-08-23 MED ORDER — ADULT MULTIVITAMIN W/MINERALS CH
1.0000 | ORAL_TABLET | Freq: Every day | ORAL | Status: DC
Start: 2016-08-24 — End: 2021-02-13

## 2016-08-23 MED ORDER — RIVAROXABAN 20 MG PO TABS: 20.0000 mg | ORAL_TABLET | Freq: Every day | ORAL | 1 refills | Status: AC

## 2016-08-23 MED ORDER — FERROUS SULFATE 325 (65 FE) MG PO TABS: 325.0000 mg | ORAL_TABLET | Freq: Every day | ORAL | 3 refills | Status: DC

## 2016-08-23 MED ORDER — BETHANECHOL CHLORIDE 50 MG PO TABS: ORAL_TABLET | ORAL | 0 refills | Status: DC

## 2016-08-23 MED ORDER — AMLODIPINE BESYLATE 10 MG PO TABS: 10.0000 mg | ORAL_TABLET | Freq: Every day | ORAL | 1 refills | Status: DC

## 2016-08-23 MED ORDER — METHOCARBAMOL 500 MG PO TABS: 500.0000 mg | ORAL_TABLET | Freq: Four times a day (QID) | ORAL | 0 refills | Status: DC | PRN

## 2016-08-23 MED ORDER — ASCORBIC ACID 250 MG PO TABS: 250.0000 mg | ORAL_TABLET | Freq: Every day | ORAL | Status: DC

## 2016-08-23 MED ORDER — OXYCODONE-ACETAMINOPHEN 5-325 MG PO TABS: 1.0000 | ORAL_TABLET | ORAL | 0 refills | Status: DC | PRN

## 2016-08-23 MED ORDER — MIRTAZAPINE 15 MG PO TABS: 15.0000 mg | ORAL_TABLET | Freq: Every day | ORAL | 1 refills | Status: DC

## 2016-08-23 MED ORDER — PAROXETINE HCL 20 MG PO TABS: 20.0000 mg | ORAL_TABLET | Freq: Every day | ORAL | 0 refills | Status: DC

## 2016-08-23 MED ORDER — OMEPRAZOLE 20 MG PO CPDR: 20.0000 mg | DELAYED_RELEASE_CAPSULE | Freq: Every day | ORAL | 0 refills | Status: DC

## 2016-08-23 NOTE — Progress Notes (Signed)
Occupational Therapy Discharge Summary  Patient Details  Name: Katrina Donaldson MRN: [id number] Date of Birth: May 24, 1956  Today's Date: 08/23/2016 OT Individual Time:  8:00- 9:00   60 min   Patient has met 5 of 8 long term goals due to improved activity tolerance, improved balance, postural control, ability to compensate for deficits, improved attention and improved awareness.  Patient to discharge at [facility] Assist level.  Patient's care partner is independent to provide the necessary physical assistance at discharge. Pt and caregiver educated on transfers, toileting, functional mobility, back precautions, and adaptive techniques. Pt caregiver able to set up/teach back toilet/BSC/bed transfers to/from w/c. Recommend next level of care continue to work on endurance, functional mobility throughout home, assess showering safety, standing balance/tolerance, and caregiver education.  Reasons goals not met: 3/8 goals not met due to fluctuating assistance required to stand in order to meet position required in goals. LB dressing, bathing and hygiene can be completed with lateral leans. Not recommending showering at home d/t safety concerns.  Recommendation:  Patient will benefit from ongoing skilled OT services in home health setting to continue to advance functional skills in the area of BADL and Reduce care partner burden.  Equipment: bariatric drop arm commode and TTB  Reasons for discharge: discharge from hospital and treatment goals met to safely d/c to next LOC  Patient/family agrees with progress made and goals achieved: Yes  OT Discharge Precautions/Restrictions  Precautions Precautions: Fall;Back Precaution Comments: recalled 3/3 precautions; caregiver educated on precautions Restrictions Weight Bearing Restrictions: No General   Pain Pain Assessment Pain Assessment: No/denies pain ADL ADL ADL Comments: refer to functional navigator Vision/Perception  Vision-  History Baseline Vision/History: Wears glasses Wears Glasses: Distance only Patient Visual Report: No change from baseline Vision- Assessment Vision Assessment?: No apparent visual deficits  Cognition Overall Cognitive Status: Within Functional Limits for tasks assessed Arousal/Alertness: Awake/alert Orientation Level: Oriented X4 Sensation Sensation Light Touch: Appears Intact Stereognosis: Appears Intact Hot/Cold: Appears Intact Proprioception: Appears Intact Coordination Fine Motor Movements are Fluid and Coordinated: Yes Coordination and Movement Description: delayed movement in LE Motor  Motor Motor - Skilled Clinical Observations: generalized weakness in LE Mobility  Bed Mobility Bed Mobility: Rolling Left;Scooting to HOB Rolling Right: 5: Supervision Rolling Left: 5: Supervision Scooting to HOB: 4: Min assist Scooting to [facility] Details: Verbal cues for safe use of DME/AE;Verbal cues for technique;Verbal cues for precautions/safety Transfers Transfers: Not assessed (not recommending sit to stands at home)  Trunk/Postural Assessment  Cervical Assessment Cervical Assessment: Within Functional Limits Thoracic Assessment Thoracic Assessment:  (back precautions) Lumbar Assessment Lumbar Assessment:  (back precautions) Postural Control Postural Control: Within Functional Limits  Balance Static Sitting Balance Static Sitting - Level of Assistance: 6: Modified independent (Device/Increase time) Dynamic Sitting Balance Dynamic Sitting - Level of Assistance: 5: Stand by assistance Static Standing Balance Static Standing - Level of Assistance: 3: Mod assist Extremity/Trunk Assessment RUE Assessment RUE Assessment: Within Functional Limits LUE Assessment LUE Assessment: Within Functional Limits   See Function Navigator for Current Functional Status.  Lowella Dell Rennee Coyne 08/23/2016, 9:10 AM

## 2016-08-23 NOTE — Progress Notes (Signed)
[facility] PHYSICAL MEDICINE & REHABILITATION     PROGRESS NOTE  Subjective/Complaints:  Pt seen laying in bed this AM.  She slept well overnight.  She states she spoke to her son, who spoke to her daughter, who will be arriving from [place].  The plan is for pt to go to an apartment tomorrow with a family friend, in [place].    ROS: +Urinary retention, improving. Denies nausea, vomiting, diarrhea, shortness of breath or chest pain   Objective: Vital Signs: Blood pressure 132/76, pulse 64, temperature 98.3 F (36.8 C), temperature source Oral, resp. rate 20, height 5\' 5"  (1.651 m), weight 86 kg (189 lb 9.5 oz), SpO2 97 %. No results found. No results for input(s): WBC, HGB, HCT, PLT in the last 72 hours. No results for input(s): NA, K, CL, GLUCOSE, BUN, CREATININE, CALCIUM in the last 72 hours.  Invalid input(s): CO CBG (last 3)  No results for input(s): GLUCAP in the last 72 hours.  Wt Readings from Last 3 Encounters:  07/24/16 86 kg (189 lb 9.5 oz)  07/19/16 90.8 kg (200 lb 2.8 oz)  07/21/15 85.7 kg (189 lb)    Physical Exam:  BP 132/76 (BP Location: Right Arm)   Pulse 64   Temp 98.3 F (36.8 C) (Oral)   Resp 20   Ht 5\' 5"  (1.651 m)   Wt 86 kg (189 lb 9.5 oz)   SpO2 97%   BMI 31.55 kg/m  Constitutional: She appears well-developed. NAD. Obese  HENT: Normocephalic and atraumatic.  Eyes: EOM are normal. No discharge. Cardiovascular: RRR. No JVD. Respiratory: Clear bilaterally. Unlabored.  GI: Soft. Bowel sounds are normal.   Musculoskeletal: She exhibits no tenderness. +Mild Edema, right foot.  Neurological: She is alert and oriented x3. Follows full commands.  Motor: LUE: 4+/5 proximal to distal LLE: 4+/5 HF,KE, 4+/5 ADF/PF RUE: 4+/5 proximal to distal RLE: HF 4/5, KE 3+/5, ADF/PF 4/5.  Skin: Skin is warm and dry.  Back incision intact Psychiatric: She has a normal mood and affect.   Assessment/Plan: 1. Functional deficits secondary to myelopathy which  require 3+ hours per day of interdisciplinary therapy in a comprehensive inpatient rehab setting. Physiatrist is providing close team supervision and 24 hour management of active medical problems listed below. Physiatrist and rehab team continue to assess barriers to discharge/monitor patient progress toward functional and medical goals.  Function:  Bathing Bathing position Bathing activity did not occur: Refused Position: Wheelchair/chair at sink  Bathing parts Body parts bathed by patient: Right arm, Left arm, Chest, Abdomen Body parts bathed by helper: Buttocks  Bathing assist Assist Level: Set up   Set up : To obtain items  Upper Body Dressing/Undressing Upper body dressing   What is the patient wearing?: Pull over shirt/dress     Pull over shirt/dress - Perfomed by patient: Thread/unthread right sleeve, Thread/unthread left sleeve, Put head through opening, Pull shirt over trunk Pull over shirt/dress - Perfomed by helper: Pull shirt over trunk        Upper body assist Assist Level: Set up   Set up : To obtain clothing/put away  Lower Body Dressing/Undressing Lower body dressing   What is the patient wearing?: Pants   Underwear - Performed by helper: Thread/unthread right underwear leg, Thread/unthread left underwear leg, Pull underwear up/down Pants- Performed by patient: Thread/unthread right pants leg, Thread/unthread left pants leg, Pull pants up/down Pants- Performed by helper: Pull pants up/down   Non-skid slipper socks- Performed by helper: Don/doff right sock,  Don/doff left sock Socks - Performed by patient: Don/doff right sock, Don/doff left sock Socks - Performed by helper: Don/doff left sock, Don/doff right sock Shoes - Performed by patient: Don/doff right shoe, Don/doff left shoe Shoes - Performed by helper: Don/doff left shoe       TED Hose - Performed by helper: Don/doff right TED hose, Don/doff left TED hose  Lower body assist Assist for lower body  dressing: Touching or steadying assistance (Pt > 75%) Assistive Device Comment: Tour manager activity did not occur: No continent bowel/bladder event Toileting steps completed by patient: Adjust clothing prior to toileting, Adjust clothing after toileting (toileting simulation activity) Toileting steps completed by helper: Adjust clothing prior to toileting Toileting Assistive Devices:  (RW; BSC)  Toileting assist Assist level: Two helpers   Transfers Chair/bed transfer Chair/bed transfer activity did not occur: Safety/medical concerns Chair/bed transfer method: Squat pivot Chair/bed transfer assist level: Moderate assist (Pt 50 - 74%/lift or lower) Chair/bed transfer assistive device: Armrests Mechanical lift: Stedy   Locomotion Ambulation Ambulation activity did not occur: Safety/medical concerns   Max distance: 1' Assist level: 2 helpers (min to mod assist with +2 for w/c follow)   Wheelchair   Type: Manual Max wheelchair distance: 150 ft Assist Level: Supervision or verbal cues  Cognition Comprehension Comprehension assist level: Understands complex 90% of the time/cues 10% of the time  Expression Expression assist level: Expresses complex 90% of the time/cues < 10% of the time  Social Interaction Social Interaction assist level: Interacts appropriately 90% of the time - Needs monitoring or encouragement for participation or interaction.  Problem Solving Problem solving assist level: Solves basic 75 - 89% of the time/requires cueing 10 - 24% of the time  Memory Memory assist level: Recognizes or recalls 90% of the time/requires cueing < 10% of the time    Medical Problem List and Plan: 1.  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 No brace required.   Cont CIR, plan for d/c tomorrow.  Will see patient in 1-2 weeks for  transitional care management. Spoke with case manager as well and this appears to be the plan, although it has been difficult to get in contact with daughter, who was supposed to be original caregiver.  Will need to find new caregiver and educate her as well as discuss therapy recs.   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    Per Neurosurg, repeat scan in 3 months.  2.  DVT Prophylaxis/Anticoagulation  Vascular study showing extensive DVT, including right iliac, but IVC appears to be clean  Continue Xarelto  3. Pain Management: Robaxin and oxycodone as needed.   Added lidoderm patch to right knee with benefit  Ice prn also is helpful 4. Mood: Paxil 20 mg daily, Provide emotional support 5. Neuropsych: This patient is capable of making decisions on her own behalf. 6. Skin/Wound Care: Routine skin checks 7. Fluids/Electrolytes/Nutrition: Routine I&Os  Remeron started 1/29,  Eating baseline, plan to wean as outpt  appreciate RD assistance 8. Hypertension. Norvasc 10 mg daily, Lopressor 100 mg twice a day.   Controlled 2/23 9. 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 10. History hypercoagulable state. INR 1.09. 11. Constipation. Laxative assistive 12. Hypokalemia: Resolved  K+ 4.4 on 2/16 13. Leukocytosis: Resolved  Afebrile 14. ABLA  Hb 10.3 on 2/16   FeSO4 and Vit C started 2/9  Cont to monitor 15. Acute lower UTI, E.Coli  Macrobid completed 2/14-2/20 16. Urinary retention  Continues to improve  Trial Bethanechol 10 TID, increased to 25 on 2/21, increased to 50 on 2/22   LOS (Days) 30 A FACE TO FACE EVALUATION WAS PERFORMED  Tayvin Preslar Lorie Phenix 08/23/2016 11:51 AM

## 2016-08-23 NOTE — Progress Notes (Signed)
Social Work Patient ID: Katrina Donaldson, female   DOB: 26-May-1956, 61 y.o.   MRN: [id number]   Pt's caregiver, Vanita Ingles, in this morning and completed education with therapies.  Finally able to connect with pt's daughter to confirm new d/c address as they have secured a ground - floor apartment for pt.  HH, DME and primary MD follow up all arranged.  Have also left a list of private duty agencies for daughter per her request.  Daughter to arrive in [facility] and will be here with mother for d/c home.    Laasya Peyton, LCSW

## 2016-08-23 NOTE — Progress Notes (Addendum)
Physical Therapy Session Note  Patient Details  Name: Kiley Fowler MRN: [id number] Date of Birth: 04-04-1956  Today's Date: 08/23/2016 PT Individual Time: 0900-1000 PT Individual Time Calculation (min): 60 min   Skilled Therapeutic Interventions/Progress Updates:   Session focused on caregiver education for preparation for discharge. Pt's hired caregiver for night time and AM time present for session and educated on and return demonstrated basic transfers (with and without slideboard), simulated car transfer, bed mobility, w/c parts management and mobility, toilet transfers and hygiene/clothing management, and overall education/safety. Recommended using slideboard for uneven surfaces, car, and when pt is more fatigued due to decrease caregiver burden. Both in agreement and caregiver able to demonstrate technique at supervision to min assist with use of slideboard. Feedback and education given on board placement, positioning of w/c for optimal transfers, and body mechanics. End of session caregiver completed toilet transfer to padded tub bench and then back to w/c (scoot pivot and then slideboard technique) successfully. Denies concerns in regards to d/c home tomorrow.   Discussed recommendation with patient and caregiver not to stand/walk at this time due to inconsistent level of assist and safety. Plan just to progress this with HHPT at this time. Both in agreement and understanding.   Therapy Documentation Precautions:  Precautions Precautions: Fall, Back Precaution Comments: recalled 3/3 precautions; caregiver educated on precautions Restrictions Weight Bearing Restrictions: No   Pain: Shoulder pain ok with heat. No intervention needed during session.    See Function Navigator for Current Functional Status.   Therapy/Group: Individual Therapy  Canary Brim Ivory Broad, PT, DPT  08/23/2016, 12:22 PM

## 2016-08-23 NOTE — Discharge Summary (Signed)
NAMEADLIH, PEDROZA              ACCOUNT NO.:  [id number]  MEDICAL RECORD NO.:  [id number]  LOCATION:                                 FACILITY:  PHYSICIAN:  Delice Lesch, MD             DATE OF BIRTH:  DATE OF ADMISSION:  07/24/2016 DATE OF DISCHARGE:                              DISCHARGE SUMMARY   ANTICIPATED DISCHARGE DATE:  August 24, 2016.  DISCHARGE DIAGNOSES: 1. 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 2. Extensive deep vein thrombosis including right iliac, maintained on     Xarelto. 3. Pain management. 4. Depression. 5. Hypertension. 6. 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 7. History of hypercoagulable state. 8. Constipation, resolved. 9. Hypokalemia, resolved. 10.Acute blood loss anemia, stable. 11.E coli urinary tract infection.  HISTORY OF PRESENT ILLNESS:  A 61 year old right-handed female, history of hypertension, hypercoagulable state, SAH requiring surgery in May 2016, in [place], [place].  Independent with a cane prior to admission. She has a sister and brother-in-law.  Presented July 19, 2016 with gait disorder, decreased balance, some intermittent falls.  MRI cervical, thoracic, lumbar spine showed large loculated arachnoid cyst with spinal cord compression extending from the top of the thoracic spine to the upper lumbar spine.  Underwent T11-L1 laminectomy, T4-T7 laminectomy, fenestration of arachnoid cyst, July 19, 2016, per Dr. Kathyrn Sheriff.  Hospital course, pain management, no back brace required. Physical and occupational therapy ongoing.  The patient was admitted for comprehensive rehab program.  PAST MEDICAL HISTORY:  See discharge diagnoses.  SOCIAL HISTORY:  Independent with a cane prior to admission.  Sister and brother-in-law and daughter recently moved to the downstairs of her home.  Functional status upon admission to rehab services,  moderate assist, 10 feet rolling walker; max assist for side lying in the bed; max total assist for activities of daily living.  PHYSICAL EXAMINATION:  VITAL SIGNS:  Blood pressure 119/81, pulse 108, temperature 98, and respirations 18. GENERAL:  This was an alert female, oriented x3.  EOMs intact. NECK:  Supple.  Nontender.  No JVD. CARDIAC:  Regular rate and rhythm.  No murmur. ABDOMEN:  Soft, nontender.  Good bowel sounds. LUNGS:  Clear to auscultation.  No respiratory wheezes. EXTREMITIES:  Left upper extremity 4+/5 proximal to distal, right upper extremity 4-/5 proximal to distal.  Left lower extremity, 4/5 hip flexors, 4+/5 ankle dorsiflexion and plantar flexion.  Right lower extremity, 4- to 4/5.  REHABILITATION HOSPITAL COURSE:  The patient was admitted to inpatient rehab services with therapies initiated on a 3-hour daily basis, consisting of physical therapy, occupational therapy, and rehabilitation nursing.  The following issues were addressed during the patient's rehabilitation stay.  Pertaining to Mrs. Marksberry's myelopathy with paraparesis secondary to large thoracolumbar spinal arachnoid cyst, she had undergone laminectomy, fenestration of cyst, July 19, 2016, would follow up with Dr. Kathyrn Sheriff of Neurosurgery.  No brace required. Routine venous Doppler studies completed showing deep vein thrombosis, common femoral-femoral and profunda femoral popliteal posterior tibial and peroneal veins in the right lower extremity as well as acute superficial DVT, lesser saphenous vein to the  right lower extremity. With these findings, she was cleared to begin Xarelto.  Pain management with the use of Robaxin, oxycodone as well as the addition of a Lidoderm patch.  She remained on Paxil for history of depression as well as Remeron.  She was participating fully with her therapies.  Blood pressures controlled on Norvasc and Lopressor.  No orthostatic changes. Bouts of constipation  resolved with laxative assistance.  She did have a history of hypercoagulable state, followed by Hematology Services and would see Dr. Benay Spice as an outpatient.  Bouts of urinary retention as well as the E coli UTI, her Macrobid had since been discontinued.  She remained afebrile.  She was placed on Urecholine as directed with latest bladder scan of 82. Her Urecholine was decreased to 25 mg 3 times daily as to monitor any risk of hypotension related to her Urecholine.  The patient received weekly collaborative interdisciplinary team conferences to discuss estimated length of stay, family teaching, any barriers to discharge.  Sessions focused on wheelchair mobility, with focus on efficient techniques, practice squat pivot transfers to elevated bed with ADL apartment.  Required min-mod assist for repetition, simulated car transfers, squat pivot technique with minimal assist using grab bar. The patient at times refused to attempt with sliding board and states her son will be able to help her with lifting and assisting, activities of daily living home making.  She demonstrated ability to cross left lower extremity over the right knee without increase in pain in her back.  Needing min-mod assist for lower body activities of daily living. She did remain limited overall but showed progressive gains.  Full family teaching was completed and plan to discharge to home.  DISCHARGE MEDICATIONS:  Included: 1. Norvasc 10 mg p.o. daily. 2. Urecholine 25 mg p.o. t.i.d., taper as directed. 3. Colace 100 mg p.o. b.i.d. 4. Ferrous sulfate 325 mg p.o. daily. 5. Flonase daily each nostril. 6. Lidoderm patch, change daily. 7. Lopressor 100 mg p.o. b.i.d. 8. Remeron 15 mg p.o. at bedtime. 9. Multivitamin daily. 10.Protonix 40 mg p.o. b.i.d. 11.Paxil 20 mg p.o. daily. 12.Xarelto 20 mg p.o. daily. 13.Vitamin C 250 mg p.o. daily. 14.Robaxin 500 mg p.o. every 6 hours as needed for muscle spasms. 15.Oxycodone  1 tablet p.o. every 4 hours as needed for pain.  DIET:  Her diet was regular.  FOLLOWUP:  The patient would follow up with Dr. Delice Lesch at the outpatient rehab service office as directed; Dr. Betsy Coder, call for appointment; Dr. Kathyrn Sheriff, Neurosurgery, 2 weeks, call for appointment; Dr. Nancy Fetter, medical management.     Lauraine Rinne, P.A.   ______________________________ Delice Lesch, MD    DA/MEDQ  D:  08/23/2016  T:  08/23/2016  Job:  [id number]  cc:   Dr. Ladona Horns, M.D. Ladell Pier, M.D.

## 2016-08-23 NOTE — Progress Notes (Signed)
Occupational Therapy Session Note  Patient Details  Name: Katrina Donaldson MRN: [id number] Date of Birth: Oct 02, 1955  Today's Date: 08/23/2016 OT Individual Time: 1331-1459 OT Individual Time Calculation (min): 88 min   Skilled Therapeutic Interventions/Progress Updates: Pt was lying in bed at time of arrival with sister Mikle Bosworth present. Mikle Bosworth reported that she would have no caregiving responsibilities at discharge and left at start of session. No other caregivers were present for family education. Tx focus on ADL retraining, cognitive skills, and functional transfers. Pt completed all slideboard transfers at [facility] A level and self propelled to and from dayroom with supervision for cues to attend to environmental barriers. Pt engaged in lateral leaning/simulated LB dressing activities at EOM while adhering to back precautions in order to increase functional independence with ADLs. Afterwards she engaged in cognitive task (jigsaw puzzle), requiring mod-max instructional and questioning cues in order to complete with extra time. Once she returned to room, pt reported needing to void. She was able to lower pants during toileting on padded tub bench but required assist to lift pants over hips afterwards. She was left in w/c with all needs within reach at time of departure.      Therapy Documentation Precautions:  Precautions Precautions: Fall, Back Precaution Comments: recalled 3/3 precautions; caregiver educated on precautions Restrictions Weight Bearing Restrictions: No General:   Vital Signs: Therapy Vitals Temp: 98.5 F (36.9 C) Temp Source: Oral Pulse Rate: 62 Resp: 19 BP: 113/74 Patient Position (if appropriate): Sitting Oxygen Therapy SpO2: 98 % O2 Device: Not Delivered Pain: No c/o pain during session    ADL: ADL ADL Comments: refer to functional navigator:    See Function Navigator for Current Functional Status.   Therapy/Group: Individual Therapy  Ticara Waner A  Arelly Whittenberg 08/23/2016, 7:43 PM

## 2016-08-23 NOTE — Progress Notes (Signed)
Occupational Therapy Session Note  Patient Details  Name: Katrina Donaldson MRN: [id number] Date of Birth: Jul 31, 1955  Today's Date: 08/23/2016 OT Individual Time: 8:00-9:00  60 min   Short Term Goals: Week 3:  OT Short Term Goal 1 (Week 3): STG = LTGs due to remaining LOS  Skilled Therapeutic Interventions/Progress Updates:    Pt seen supine in bed. Focus of 1:1 session on d/c planning, ADL retraining, and caregiver education. Pt dons pull over shirt, pants, socks, ted hose, and shoes with supervision sitting in bed with HOB elevated. Pt req VC to come to EOB to lean laterally to advance pants past hips. With patient caregiver present, OT educates on safety awareness, transfer training, toileting and toilet transfers. OT does not recommend caregiver assist pt to stand. Caregiver able to teach back and perform bed, toilet and BSC lateral scoot transfers to/from w/c. OT reviewed back precautions with pt and caregiver and pt caregiver demonstrated correct assistance with bed mobility to maintain precautions. Pt left in w/c with caregiver present, call light in reach and all needs met.  Therapy Documentation Precautions:  Precautions Precautions: Fall, Back Precaution Comments: recalled 3/3 precautions; caregiver educated on precautions Restrictions Weight Bearing Restrictions: No Pain:  no c/o pain ADL: ADL ADL Comments: refer to functional navigator See Function Navigator for Current Functional Status.   Therapy/Group: Individual Therapy  Tonny Branch 08/23/2016, 5:56 PM

## 2016-08-23 NOTE — Progress Notes (Signed)
Physical Therapy Discharge Summary  Patient Details  Name: Katrina Donaldson MRN: [id number] Date of Birth: [birth year]   Patient has met 7 of 7 long term goals due to improved activity tolerance, improved balance, improved postural control, increased strength, ability to compensate for deficits, functional use of  right lower extremity and left lower extremity, improved attention, improved awareness and improved coordination.  Patient to discharge at a wheelchair level min to mod assist.   Patient's care partner is independent to provide the necessary physical and cognitive assistance at discharge. Pt continues to require cues for memory, sequencing, and awareness in regards to mobility and recommending hands-on 24/7 supervision. Hired caregiver present for family education today.   Reasons goals not met: n/a - all goals met.  Recommendation:  Patient will benefit from ongoing skilled PT services in home health setting to continue to advance safe functional mobility, address ongoing impairments in strength, balance, postural control, activity tolerance, cognition, gait, functional mobility, and minimize fall risk.  Equipment: 18x18 w/c, RW, and slideboard  Reasons for discharge: treatment goals met and discharge from hospital  Patient/family agrees with progress made and goals achieved: Yes  PT Discharge Precautions/Restrictions Precautions Precautions: Fall;Back Precaution Comments: recalled 3/3 precautions; caregiver educated on precautions Restrictions Weight Bearing Restrictions: No    Cognition Overall Cognitive Status: Impaired/Different from baseline (recommending supervision upon d/c.) Arousal/Alertness: Awake/alert Orientation Level: Oriented X4 Memory: Impaired Memory Impairment: Decreased recall of new information Awareness: Impaired Awareness Impairment: Anticipatory impairment Problem Solving: Impaired Problem Solving Impairment: Functional complex Executive Function:  Sequencing;Self Correcting Sequencing: Impaired (increased time and cues ) Sequencing Impairment: Functional complex Comments: pt overall supervision for cognition. continues to require cues for memory, sequencing, and awareness Sensation Sensation Light Touch: Appears Intact Stereognosis: Appears Intact Hot/Cold: Appears Intact Proprioception: Appears Intact Coordination Gross Motor Movements are Fluid and Coordinated: No Fine Motor Movements are Fluid and Coordinated: Yes Coordination and Movement Description: delayed movement in LE Motor  Motor Motor: Other (comment);Abnormal postural alignment and control (paraparesis) Motor - Skilled Clinical Observations: generalized weakness in LE Motor - Discharge Observations: generalized LE weakness; RLE > LLE     Trunk/Postural Assessment  Cervical Assessment Cervical Assessment: Within Functional Limits Thoracic Assessment Thoracic Assessment:  (back precautions) Lumbar Assessment Lumbar Assessment:  (back precautions) Postural Control Postural Control: Within Functional Limits  Balance Static Sitting Balance Static Sitting - Level of Assistance: 6: Modified independent (Device/Increase time) Dynamic Sitting Balance Dynamic Sitting - Level of Assistance: 5: Stand by assistance Static Standing Balance Static Standing - Level of Assistance: 3: Mod assist Extremity Assessment  RUE Assessment RUE Assessment: Within Functional Limits LUE Assessment LUE Assessment: Within Functional Limits RLE Assessment RLE Assessment: Exceptions to [facility] (3-/5 hip; 3+/5 knee and ankle) LLE Assessment LLE Assessment: Exceptions to [facility] (grossly 3+ to 4-/5)   See Function Navigator for Current Functional Status.  Canary Brim Ivory Broad, PT, DPT  08/23/2016, 2:50 PM

## 2016-08-23 NOTE — Discharge Summary (Signed)
Discharge summary job [phone]

## 2016-08-23 NOTE — Plan of Care (Signed)
Problem: RH Bathing Goal: LTG Patient will bathe with assist, cues/equipment (OT) LTG: Patient will bathe specified number of body parts with assist with/without cues using equipment (position)  (OT)  Outcome: Not Met (add Reason) Goal not met d/t fluctuating assistance needed to perform sit to stand to perform peri/posterior cleaning  Problem: RH Dressing Goal: LTG Patient will perform lower body dressing w/assist (OT) LTG: Patient will perform lower body dressing with assist, with/without cues in positioning using equipment (OT)  Outcome: Not Met (add Reason) Goal not met d/t fluctuating assistance required to sit to stand. Pt can perform lateral leans to advance pants past hips  Problem: RH Tub/Shower Transfers Goal: LTG Patient will perform tub/shower transfers w/assist (OT) LTG: Patient will perform tub/shower transfers with assist, with/without cues using equipment (OT)  Outcome: Not Met (add Reason) Goal not met due to patient requiring more assistance for safety. Not recommending showering at home.

## 2016-08-24 NOTE — Progress Notes (Signed)
[place] PHYSICAL MEDICINE & REHABILITATION     PROGRESS NOTE  Subjective/Complaints:  Patient eager to go home Feels well     Objective: Vital Signs: Blood pressure (!) 151/85, pulse 62, temperature 98.1 F (36.7 C), temperature source Oral, resp. rate 18, height 5\' 5"  (1.651 m), weight 189 lb 9.5 oz (86 kg), SpO2 98 %. Physical Exam:  BP (!) 151/85 (BP Location: Left Arm)   Pulse 62   Temp 98.1 F (36.7 C) (Oral)   Resp 18   Ht 5\' 5"  (1.651 m)   Wt 189 lb 9.5 oz (86 kg)   SpO2 98%   BMI 31.55 kg/m   No acute distress. Chest clear to auscultation Cardiac exam S1 and S2 are regular Abdominal exam active bowel sounds, soft Extremities no edema  Assessment/Plan: 1. Functional deficits secondary to myelopathy   Medical Problem List and Plan: 1.  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 No brace required.    plan for d/c   F/u  in 1-2 weeks for transitional care management. This has been scheduled.   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    Per Neurosurg, repeat scan in 3 months.  2.  DVT Prophylaxis/Anticoagulation  Vascular study showing extensive DVT, including right iliac, but IVC appears to be clean  Continue Xarelto  3. Pain Management: controlled 4. Mood: Paxil 20 mg daily, Provide emotional support 5. Neuropsych: This patient is capable of making decisions on her own behalf. 6. Skin/Wound Care: Routine skin checks 7. Fluids/Electrolytes/Nutrition: Routine I&Os  Remeron started 1/29,  Eating baseline, plan to wean as outpt   8. Hypertension. Norvasc 10 mg daily, Lopressor 100 mg twice a day.  Adequately controlled. 113/74-151/85 9. SAH requiring surgery 11/29/2014 in  [place]. 10. History hypercoagulable state. INR 1.09. 11. Constipation. Laxative assistive 12. Hypokalemia: Resolved   13. Leukocytosis: Resolved  Afebrile 14. ABLA  Hb 10.3 on 2/16   FeSO4 and Vit C started 2/9  Cont to monitor 15. Acute lower UTI, E.Coli  Macrobid completed 2/14-2/20 16. Urinary retention  Continues to improve    LOS (Days) 31 A FACE TO FACE EVALUATION WAS PERFORMED  Katrina Donaldson Katrina Donaldson 08/24/2016 8:58 AM

## 2016-08-24 NOTE — Progress Notes (Signed)
Social Work  Discharge Note  The overall goal for the admission was met for:   Discharge location: Yes - home with family and friend to provide 24/7 assistance.  Length of Stay: Yes - 31 days  Discharge activity level: Yes - min assist overall  Home/community participation: Yes  Services provided included: MD, RD, PT, OT, SLP, RN, TR, Pharmacy, Neuropsych and SW  Financial Services: Private Insurance: BCBS  Follow-up services arranged: Home Health: RN, PT, OT via [place] and DME: 18x18 lightweight w/c, cushion, rolling walker, drop arm commode and 30" transfer board via [facility]  Comments (or additional information):  NEW ADDRESS:  [phone]                                                                                                    [place]  Patient/Family verbalized understanding of follow-up arrangements: Yes  Individual responsible for coordination of the follow-up plan: pt/ daughter, Emeterio Reeve  Confirmed correct DME delivered: Lennart Pall 08/24/2016    Lennart Pall

## 2016-08-24 NOTE — Progress Notes (Signed)
Patient discharged home.  Left floor via wheelchair, escorted by nursing staff and family.  Patient verbalized understanding of discharge instructions as given by Dan Angiulli, PA.  All patient belongings sent with patient, including DME and prescriptions.  Appears to be in no immediate distress at this time.  Shihab States J, RN  

## 2016-08-28 ENCOUNTER — Telehealth: Payer: Self-pay | Admitting: *Deleted

## 2016-08-28 NOTE — Telephone Encounter (Signed)
Holly RN called to report at the request of the patient the [facility] (start of care) for [facility] will be delayed until Friday

## 2016-08-29 ENCOUNTER — Telehealth: Payer: Self-pay | Admitting: *Deleted

## 2016-08-29 NOTE — Telephone Encounter (Signed)
No answer. Left message left on VM with appt date and time and address and ph number (09/05/16 @ 11:20 arrive by 11:00)

## 2016-08-30 ENCOUNTER — Telehealth: Payer: Self-pay | Admitting: Physical Medicine & Rehabilitation

## 2016-08-30 NOTE — Telephone Encounter (Signed)
She has been sitting in chair a lot. I have instructed her to either keep legs elevated in a recliner or lying down in bed puts them on the level with her heart which should help with swelling.  If she has more concerns she should seek care in Urgent Care or ED over the weekend and PCP when she can be seen. She is taking her xarelto.

## 2016-08-30 NOTE — Telephone Encounter (Signed)
She should be on anticoagulation.  If she has worsening of symptoms she needs to see her PCP or urgent care.  Thanks.

## 2016-08-30 NOTE — Telephone Encounter (Signed)
Patient recently discharged from hospital.  Patient was in hospital for blood clot from surgery and her feet and legs are swollen.  She would like to know what to do.  She is very worried.

## 2016-09-05 ENCOUNTER — Encounter: Payer: Self-pay | Admitting: Physical Medicine & Rehabilitation

## 2016-09-05 ENCOUNTER — Telehealth: Payer: Self-pay | Admitting: Oncology

## 2016-09-05 ENCOUNTER — Encounter
Payer: BC Managed Care – PPO | Attending: Physical Medicine & Rehabilitation | Admitting: Physical Medicine & Rehabilitation

## 2016-09-05 VITALS — BP 108/73 | HR 67 | Resp 14

## 2016-09-05 DIAGNOSIS — R630 Anorexia: Secondary | ICD-10-CM | POA: Diagnosis not present

## 2016-09-05 DIAGNOSIS — F32A Depression, unspecified: Secondary | ICD-10-CM

## 2016-09-05 DIAGNOSIS — R339 Retention of urine, unspecified: Secondary | ICD-10-CM

## 2016-09-05 DIAGNOSIS — I10 Essential (primary) hypertension: Secondary | ICD-10-CM | POA: Insufficient documentation

## 2016-09-05 DIAGNOSIS — Z79899 Other long term (current) drug therapy: Secondary | ICD-10-CM | POA: Insufficient documentation

## 2016-09-05 DIAGNOSIS — I82412 Acute embolism and thrombosis of left femoral vein: Secondary | ICD-10-CM

## 2016-09-05 DIAGNOSIS — D6859 Other primary thrombophilia: Secondary | ICD-10-CM | POA: Diagnosis not present

## 2016-09-05 DIAGNOSIS — Z8673 Personal history of transient ischemic attack (TIA), and cerebral infarction without residual deficits: Secondary | ICD-10-CM | POA: Insufficient documentation

## 2016-09-05 DIAGNOSIS — Z7901 Long term (current) use of anticoagulants: Secondary | ICD-10-CM | POA: Diagnosis not present

## 2016-09-05 DIAGNOSIS — G919 Hydrocephalus, unspecified: Secondary | ICD-10-CM | POA: Diagnosis not present

## 2016-09-05 DIAGNOSIS — G9589 Other specified diseases of spinal cord: Secondary | ICD-10-CM | POA: Insufficient documentation

## 2016-09-05 DIAGNOSIS — M4714 Other spondylosis with myelopathy, thoracic region: Secondary | ICD-10-CM

## 2016-09-05 DIAGNOSIS — Z86718 Personal history of other venous thrombosis and embolism: Secondary | ICD-10-CM | POA: Diagnosis not present

## 2016-09-05 DIAGNOSIS — G8220 Paraplegia, unspecified: Secondary | ICD-10-CM | POA: Insufficient documentation

## 2016-09-05 DIAGNOSIS — G959 Disease of spinal cord, unspecified: Secondary | ICD-10-CM

## 2016-09-05 DIAGNOSIS — G8918 Other acute postprocedural pain: Secondary | ICD-10-CM | POA: Diagnosis not present

## 2016-09-05 DIAGNOSIS — F329 Major depressive disorder, single episode, unspecified: Secondary | ICD-10-CM | POA: Diagnosis not present

## 2016-09-05 DIAGNOSIS — Z8679 Personal history of other diseases of the circulatory system: Secondary | ICD-10-CM

## 2016-09-05 DIAGNOSIS — E039 Hypothyroidism, unspecified: Secondary | ICD-10-CM | POA: Insufficient documentation

## 2016-09-05 NOTE — Telephone Encounter (Signed)
Patient needs to schedule a follow up appointment for Dr Benay Spice   [phone] Please call patient with new appointment

## 2016-09-05 NOTE — Progress Notes (Signed)
Subjective:    Patient ID: Katrina Donaldson, female    DOB: July 01, 1956, 61 y.o.   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  Pt is relatively poor historian.  At discharge, she was instructed to follow up with Dr. Benay Spice (Heme/Onc), which she states she has done. She has an appointment to see Neurosurg in a few weeks. She saw her PCP. She continues to take Xarelto.  She states she never received the lidoderm patches. She is taking Remeron.  Her BP is controlled. She is no longer having urinary retention. She denies falls.    Pain Inventory Average Pain 1 Pain Right Now 1 My pain is intermittent  In the last 24 hours, has pain interfered with the following? General activity 1 Relation with others 1 Enjoyment of life 0 What TIME of day is your pain at its worst? night Sleep (in general) NA  Pain is worse with: unsure and rolling wheel chair causes wrist pain Pain improves with: rest Relief from Meds: n/a  Mobility ability to climb steps?  no do you drive?  no use a wheelchair needs help with transfers  Function retired I need assistance with the following:  meal prep, household duties and shopping  Neuro/Psych weakness numbness tingling trouble walking spasms  Prior Studies Any changes since last visit?  no  Physicians involved in your care Any changes since last visit?  no Primary care Dr Nancy Fetter   No family history on file. Social History   Social History  . Marital status: Widowed    Spouse name: N/A  . Number of children: N/A  . Years of education: N/A   Social History Main Topics  . Smoking status: Never Smoker  . Smokeless tobacco: Never Used  . Alcohol use No  . Drug use: No  . Sexual activity: Not Asked   Other Topics Concern  . None   Social History  Narrative  . None   Past Surgical History:  Procedure Laterality Date  . ABDOMINAL HYSTERECTOMY     patient denies  . BRAIN SURGERY     aneurysm  . goiter    . LAMINECTOMY N/A 07/19/2016   Procedure: LAMINECTOMY THORACIC FOUR THORACIC FIVE, THORACIC SIX TO THORACIC EIGHT,THORACIC TEN TO LUMBAR ONE, FENESTRATION OF ARACHNOID CYSTS;  Surgeon: Consuella Lose, MD;  Location: [facility];  Service: Neurosurgery;  Laterality: N/A;  . THYROIDECTOMY     Past Medical History:  Diagnosis Date  . Brain aneurysm   . Depression   . History of DVT (deep vein thrombosis)   . Hypercoagulable state ([place])   . Hypertension   . Hypothyroidism   . Stroke ([place]) 2016   [facility] 11/29/14   BP 108/73   Pulse 67   Resp 14   SpO2 98%   Opioid Risk Score:   Fall Risk Score:  `1  Depression screen PHQ 2/9  Depression screen PHQ 2/9 09/05/2016  Decreased Interest 0  Down, Depressed, Hopeless 0  PHQ - 2 Score 0  Altered sleeping 0  Tired, decreased energy 0  Change in appetite 0  Feeling bad or failure about yourself  0  Trouble concentrating 0  Moving slowly or fidgety/restless 0  Suicidal thoughts 0  PHQ-9 Score 0   Review of Systems  Constitutional: Negative.   HENT: Negative.   Eyes: Negative.   Respiratory: Negative.  Cardiovascular: Positive for leg swelling.  Gastrointestinal: Negative.   Endocrine: Negative.   Genitourinary: Negative.   Musculoskeletal: Positive for arthralgias and gait problem.       Spasms  Skin: Positive for rash.  Allergic/Immunologic: Negative.   Neurological: Positive for numbness.       Tingling  Hematological: Negative.   Psychiatric/Behavioral: Negative.   All other systems reviewed and are negative.      Objective:   Physical Exam  Constitutional: She appears well-developed. NAD. Obese HENT: Normocephalic and atraumatic.  Eyes: EOM are normal. No discharge. Cardiovascular: RRR. No JVD. Respiratory: Clear bilaterally. Unlabored.  GI: Soft. Bowel  sounds are normal.   Musculoskeletal: She exhibits no tenderness. +Mild Edema, right foot.  Neurological: She is alert and oriented x3. Motor: LUE: 5/5 proximal to distal LLE: 4/5 HF,KE, 5/5 ADF/PF RUE: 5/5 proximal to distal RLE: HF 4-/5, KE 4-/5, ADF/PF 4+/5.  Skin: Skin is warm and dry.  Back incision intact Psychiatric: She has a normal mood and affect.     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  1. 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   No brace required.   Follow up Neurosurg  Follow up Heme/Onc  Pt has not started therapy yet. She is in the process of restarting.  Pt is currently staying in an apartment with ?support  2. Right hydrocephalus  After coil embolization of LVA aneurysm suggesting hypdocephalus  Repeat scan in 3 months per Neurosurg  3. DVT  Vascular study showing extensive DVT, including right iliac, but IVC appears to be clean             Continue Xarelto   4. Pain Management  Robaxin and oxycodone as needed.   Cont meds  Controlled at present   5. Mood  Cont Paxil per PCP  6. Fluids/Electrolytes/Nutrition  Remeron weaned to 7.5mg  x1 week, then d/c  7. Urinary retention  Improving   Cont Bethanechol taper and then d/c

## 2016-09-06 ENCOUNTER — Telehealth: Payer: Self-pay | Admitting: *Deleted

## 2016-09-06 NOTE — Telephone Encounter (Signed)
Call placed to patient to verify need for appt with Dr. Benay Spice.  Patient states that Dr. Benay Spice visited patient during her hospital visit in January.  Will f/u with Dr. Benay Spice.

## 2016-09-06 NOTE — Telephone Encounter (Signed)
Kim from [facility] left a message stating that they need a new home health order faxed over.  The previous order from hospital discharge had lapsed and they were unable to establish home care.  A new referral was produced and faxed to [facility] home [facility]

## 2016-09-06 NOTE — Addendum Note (Signed)
Addended by: Geryl Rankins D on: 09/06/2016 11:42 AM   Modules accepted: Orders

## 2016-09-09 NOTE — Telephone Encounter (Signed)
Returned call to pt, Dr. Benay Spice recommends she contact Dr. Azucena Freed office for appt. Contact information given, she voiced understanding.

## 2016-09-11 ENCOUNTER — Telehealth: Payer: Self-pay | Admitting: *Deleted

## 2016-09-11 NOTE — Telephone Encounter (Signed)
Requesting orders for OT 2wk2.  Approval given.

## 2016-10-02 ENCOUNTER — Telehealth: Payer: Self-pay | Admitting: *Deleted

## 2016-10-02 DIAGNOSIS — M4714 Other spondylosis with myelopathy, thoracic region: Secondary | ICD-10-CM

## 2016-10-02 NOTE — Telephone Encounter (Signed)
Physical therapist left a message stating that patient has graduated from home health PT and is ready for outpatient PT. Contacted patient and she seemed okay with neurorehab on [address].   Referral placed and sent to [facility] for processing

## 2016-10-17 ENCOUNTER — Encounter: Payer: Self-pay | Admitting: Physical Medicine & Rehabilitation

## 2016-10-17 ENCOUNTER — Encounter
Payer: BC Managed Care – PPO | Attending: Physical Medicine & Rehabilitation | Admitting: Physical Medicine & Rehabilitation

## 2016-10-17 ENCOUNTER — Ambulatory Visit: Payer: BC Managed Care – PPO | Admitting: Physical Medicine & Rehabilitation

## 2016-10-17 VITALS — BP 131/81 | HR 65

## 2016-10-17 DIAGNOSIS — Z8679 Personal history of other diseases of the circulatory system: Secondary | ICD-10-CM

## 2016-10-17 DIAGNOSIS — G8220 Paraplegia, unspecified: Secondary | ICD-10-CM | POA: Insufficient documentation

## 2016-10-17 DIAGNOSIS — Z86718 Personal history of other venous thrombosis and embolism: Secondary | ICD-10-CM | POA: Insufficient documentation

## 2016-10-17 DIAGNOSIS — G919 Hydrocephalus, unspecified: Secondary | ICD-10-CM | POA: Diagnosis not present

## 2016-10-17 DIAGNOSIS — Z79899 Other long term (current) drug therapy: Secondary | ICD-10-CM | POA: Diagnosis not present

## 2016-10-17 DIAGNOSIS — Z8673 Personal history of transient ischemic attack (TIA), and cerebral infarction without residual deficits: Secondary | ICD-10-CM | POA: Diagnosis not present

## 2016-10-17 DIAGNOSIS — D6859 Other primary thrombophilia: Secondary | ICD-10-CM

## 2016-10-17 DIAGNOSIS — I10 Essential (primary) hypertension: Secondary | ICD-10-CM | POA: Diagnosis not present

## 2016-10-17 DIAGNOSIS — G9589 Other specified diseases of spinal cord: Secondary | ICD-10-CM | POA: Diagnosis not present

## 2016-10-17 DIAGNOSIS — F329 Major depressive disorder, single episode, unspecified: Secondary | ICD-10-CM

## 2016-10-17 DIAGNOSIS — G959 Disease of spinal cord, unspecified: Secondary | ICD-10-CM | POA: Diagnosis not present

## 2016-10-17 DIAGNOSIS — M4714 Other spondylosis with myelopathy, thoracic region: Secondary | ICD-10-CM

## 2016-10-17 DIAGNOSIS — I82412 Acute embolism and thrombosis of left femoral vein: Secondary | ICD-10-CM

## 2016-10-17 DIAGNOSIS — F32A Depression, unspecified: Secondary | ICD-10-CM

## 2016-10-17 DIAGNOSIS — E039 Hypothyroidism, unspecified: Secondary | ICD-10-CM | POA: Insufficient documentation

## 2016-10-17 DIAGNOSIS — Z7901 Long term (current) use of anticoagulants: Secondary | ICD-10-CM | POA: Diagnosis not present

## 2016-10-17 NOTE — Progress Notes (Signed)
Subjective:    Patient ID: Katrina Donaldson, female    DOB: January 02, 1956, 61 y.o.   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  Pt is relatively poor historian.  Last clinic visit 09/05/16.  Since that time, she does not recall seeing Neurologist. She has not seen Heme/Onc. She states she completed HH therapies, but has not given forms to [facility] to be able to attend outpatient therapy. She is now staying independently at an apartment, where someone checks on her daily.  She continues to take Xarelto. She denies pain. She weaned off of Remeron.  She is not sure if she is still taking Bethanechol.  She does her own medication management. Denies falls since last visit.   Pain Inventory Average Pain 1 Pain Right Now 1 My pain is intermittent  In the last 24 hours, has pain interfered with the following? General activity 1 Relation with others 1 Enjoyment of life 0 What TIME of day is your pain at its worst? night Sleep (in general) NA  Pain is worse with: unsure and rolling wheel chair causes wrist pain Pain improves with: rest Relief from Meds: n/a  Mobility ability to climb steps?  no do you drive?  no use a wheelchair needs help with transfers  Function retired I need assistance with the following:  meal prep, household duties and shopping  Neuro/Psych weakness numbness tingling trouble walking spasms  Prior Studies Any changes since last visit?  no  Physicians involved in your care Any changes since last visit?  no Primary care Dr Nancy Fetter   No family history on myelopathy. Social History   Social History  . Marital status: Widowed    Spouse name: N/A  . Number of children: N/A  . Years of education: N/A   Social History Main Topics  . Smoking status: Never Smoker  . Smokeless tobacco:  Never Used  . Alcohol use No  . Drug use: No  . Sexual activity: Not Asked   Other Topics Concern  . None   Social History Narrative  . None   Past Surgical History:  Procedure Laterality Date  . ABDOMINAL HYSTERECTOMY     patient denies  . BRAIN SURGERY     aneurysm  . goiter    . LAMINECTOMY N/A 07/19/2016   Procedure: LAMINECTOMY THORACIC FOUR THORACIC FIVE, THORACIC SIX TO THORACIC EIGHT,THORACIC TEN TO LUMBAR ONE, FENESTRATION OF ARACHNOID CYSTS;  Surgeon: Consuella Lose, MD;  Location: [place] [facility];  Service: Neurosurgery;  Laterality: N/A;  . THYROIDECTOMY     Past Medical History:  Diagnosis Date  . Brain aneurysm   . Depression   . History of DVT (deep vein thrombosis)   . Hypercoagulable state ([place])   . Hypertension   . Hypothyroidism   . Stroke ([place]) 2016   [facility] 11/29/14   BP 131/81   Pulse 65   SpO2 97%   Opioid Risk Score:   Fall Risk Score:  `1  Depression screen PHQ 2/9  Depression screen PHQ 2/9 09/05/2016  Decreased Interest 0  Down, Depressed, Hopeless 0  PHQ - 2 Score 0  Altered sleeping 0  Tired, decreased energy 0  Change in appetite 0  Feeling bad or failure about yourself  0  Trouble concentrating 0  Moving slowly or fidgety/restless 0  Suicidal thoughts 0  PHQ-9 Score 0   Review of  Systems  Constitutional: Negative.   HENT: Negative.   Eyes: Negative.   Respiratory: Negative.   Cardiovascular: Positive for leg swelling.  Gastrointestinal: Negative.   Endocrine: Negative.   Genitourinary: Negative.   Musculoskeletal: Positive for arthralgias and gait problem.       Spasms  Skin: Positive for rash.  Allergic/Immunologic: Negative.   Neurological: Positive for numbness.       Tingling  Hematological: Negative.   Psychiatric/Behavioral: Negative.   All other systems reviewed and are negative.     Objective:   Physical Exam  Constitutional: She appears well-developed. NAD. Obese HENT: Normocephalic and atraumatic.  Eyes: EOM  are normal. No discharge. Cardiovascular: RRR. No JVD. Respiratory: Clear bilaterally. Unlabored.  GI: Soft. Bowel sounds are normal.   Musculoskeletal: She exhibits no tenderness. +RLE edema.  Neurological: She is alert and oriented x3. Motor: LUE: 5/5 proximal to distal LLE: 4+/5 HF,KE, 5/5 ADF/PF RUE: 5/5 proximal to distal RLE: HF 4/5, KE 4/5, ADF/PF 4+/5.  Skin: Skin is warm and dry.  Back incision intact Psychiatric: She has a normal mood and affect.     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  1. 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   No brace required.   Cont follow up Neurosurg  Follow up Heme/Onc (pt has not seen)  Comp HH PT, in the process of applying for [facility] for outpt PT  Pt is currently staying in an apartment independently  2. Right hydrocephalus  After coil embolization of LVA aneurysm suggesting hypdocephalus  Repeat scan in 3 months per Neurosurg (next month) - needs to make appointment  3. DVT  Vascular study showing extensive DVT, including right iliac, but IVC appears to be clean             Continue Xarelto   Encouraged elevation RLE  4. Pain Management  Resolved  5. Mood  Cont Paxil per PCP  6. Hypercoaguable state  Pt needs to make appointment with Heme/Onc

## 2016-10-17 NOTE — Patient Instructions (Signed)
Please make folllow up appointment with Ladell Pier, M.D. - Hematology for hypercoaguable states  Please make folllow up appointment with Dr. Kathyrn Sheriff - Neurosurgery for repeat head CT

## 2016-10-18 ENCOUNTER — Ambulatory Visit
Payer: BC Managed Care – PPO | Attending: Physical Medicine & Rehabilitation | Admitting: Rehabilitative and Restorative Service Providers"

## 2016-10-22 ENCOUNTER — Ambulatory Visit (INDEPENDENT_AMBULATORY_CARE_PROVIDER_SITE_OTHER): Payer: BC Managed Care – PPO | Admitting: Oncology

## 2016-10-22 ENCOUNTER — Encounter: Payer: Self-pay | Admitting: Oncology

## 2016-10-22 VITALS — BP 124/82 | HR 60 | Temp 97.9°F | Ht 65.0 in | Wt 212.3 lb

## 2016-10-22 DIAGNOSIS — Z8249 Family history of ischemic heart disease and other diseases of the circulatory system: Secondary | ICD-10-CM

## 2016-10-22 DIAGNOSIS — K08409 Partial loss of teeth, unspecified cause, unspecified class: Secondary | ICD-10-CM

## 2016-10-22 DIAGNOSIS — I10 Essential (primary) hypertension: Secondary | ICD-10-CM | POA: Diagnosis not present

## 2016-10-22 DIAGNOSIS — Z91013 Allergy to seafood: Secondary | ICD-10-CM

## 2016-10-22 DIAGNOSIS — Z8679 Personal history of other diseases of the circulatory system: Secondary | ICD-10-CM | POA: Diagnosis not present

## 2016-10-22 DIAGNOSIS — D508 Other iron deficiency anemias: Secondary | ICD-10-CM

## 2016-10-22 DIAGNOSIS — Z86718 Personal history of other venous thrombosis and embolism: Secondary | ICD-10-CM | POA: Diagnosis not present

## 2016-10-22 DIAGNOSIS — E890 Postprocedural hypothyroidism: Secondary | ICD-10-CM

## 2016-10-22 DIAGNOSIS — I824Y1 Acute embolism and thrombosis of unspecified deep veins of right proximal lower extremity: Secondary | ICD-10-CM

## 2016-10-22 LAB — D-DIMER, QUANTITATIVE: D-Dimer, Quant: 1.37 ug/mL-FEU — ABNORMAL HIGH (ref 0.00–0.50)

## 2016-10-22 NOTE — Patient Instructions (Signed)
To lab today Return visit 8-10 weeks no lab

## 2016-10-22 NOTE — Progress Notes (Signed)
New Patient Hematology   Katrina Donaldson [id number] [birth year] 61 y.o. 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  She was treated with Coumadin anticoagulation but she does not remember for how long.  Flow sheet from Dr. Lynnda Child office indicates at least through March 2015.  She did not have a subsequent thrombotic event until a routine screening study done on January 25 when she went on the rehab unit following extensive spinal surgery done on January 19 for impending cord compression from a large arachnoid cyst.  Cyst probably developed as a consequence of previous ruptured cerebral aneurysm which occurred on Nov 28, 2014 and required surgical drainage.  She was visiting friends at that time in [place].  She did establish with Dr. Lajuana Ripple when she returned to [facility] so he had a baseline evaluation.  She returned to see him again in January of this year when she developed ataxia and lower extremity weakness.  She underwent extensive surgery with a T4 through T7 and a T11 through L1 laminectomy in addition to fenestration of the arachnoid cyst. She has treated hypertension.  No other cardiac risk factors.  She is a never smoker.  She has no history of lupus, inflammatory arthritis, or other signs or symptoms of a collagen vascular disorder.  Of note her mother had a ruptured aneurysm when the patient was in the sixth grade.  She also required emergency surgery but had a full recovery.  She died at age 90 of unrelated causes. She developed confusion while on inpatient rehab service on January 29.  A CT of the brain was done but did not show any acute hemorrhage.  There was moderate  right hydrocephalus.  Right frontal encephalomalacia secondary to prior intracranial bleed and a remote infarct in the left cerebellum. She was put back on anticoagulation with Xarelto when otherwise stable at current dose 20 mg.  No one else in the family has had problems with blood clots including her father who died at age 44 of an MI or her 105 siblings.  There is history of coronary artery disease in her family and at least one and may be 2 brothers and a sister have had coronary bypass surgeries.  She has a son age 41 living in [place] and a daughter age 35 living in in [place] who are in good health.   PMH: Past Medical History:  Diagnosis Date  . Brain aneurysm   . Depression controlled on paroxetine.   Marland Kitchen History of DVT (deep vein thrombosis)   . Hypercoagulable state ([place])   . Hypertension   . Hypothyroidism   . Stroke [facility]) 2016   [facility] 11/29/14  Although it is recorded that she had a "stroke": This was likely a complication of a ruptured cerebral aneurysm and subarachnoid hemorrhage. No diabetes.  Ulcers.  GERD.  Asthma.  No history of TB, hepatitis, yellow jaundice, mononucleosis, kidney disease, inflammatory arthritis.  Partial thyroidectomy in the past for goiter.  Subsequent development of Graves' disease and she was treated with radioactive iodine in December 2017.  Past Surgical History:  Procedure Laterality Date  . ABDOMINAL HYSTERECTOMY     patient denies  . BRAIN SURGERY  aneurysm  . goiter    . LAMINECTOMY N/A 07/19/2016   Procedure: LAMINECTOMY THORACIC FOUR THORACIC FIVE, THORACIC SIX TO THORACIC EIGHT,THORACIC TEN TO LUMBAR ONE, FENESTRATION OF ARACHNOID CYSTS;  Surgeon: Consuella Lose, MD;  Location: [facility];  Service: Neurosurgery;  Laterality: N/A;  . THYROIDECTOMY      Allergies: Allergies  Allergen Reactions  . Other Other (See Comments)    Allergic to shellfish such as shrimp     Medications:  Current Outpatient Prescriptions:  .   amLODipine (NORVASC) 10 MG tablet, Take 1 tablet (10 mg total) by mouth daily., Disp: 30 tablet, Rfl: 1 .  bethanechol (URECHOLINE) 25 MG tablet, 25 mg 3 times a day 1 week then 25 mg twice a day 1 week then 25 mg daily 1 week and stop, Disp: 42 tablet, Rfl: 0 .  ferrous sulfate 325 (65 FE) MG tablet, Take 1 tablet (325 mg total) by mouth daily with breakfast., Disp: 30 tablet, Rfl: 3 .  fluticasone (FLONASE) 50 MCG/ACT nasal spray, Place 1 spray into both nostrils daily., Disp: , Rfl:  .  lidocaine (LIDODERM) 5 %, Place 1 patch onto the skin daily. Remove & Discard patch within 12 hours or as directed by MD, Disp: 30 patch, Rfl: 0 .  methocarbamol (ROBAXIN) 500 MG tablet, Take 1 tablet (500 mg total) by mouth every 6 (six) hours as needed for muscle spasms., Disp: 60 tablet, Rfl: 0 .  metoprolol (LOPRESSOR) 100 MG tablet, Take 1 tablet (100 mg total) by mouth 2 (two) times daily., Disp: 60 tablet, Rfl: 0 .  Multiple Vitamin (MULTIVITAMIN WITH MINERALS) TABS tablet, Take 1 tablet by mouth daily., Disp: , Rfl:  .  omeprazole (PRILOSEC) 20 MG capsule, Take 1 capsule (20 mg total) by mouth daily., Disp: 30 capsule, Rfl: 0 .  oxyCODONE-acetaminophen (PERCOCET/ROXICET) 5-325 MG tablet, Take 1-2 tablets by mouth every 4 (four) hours as needed for moderate pain., Disp: 30 tablet, Rfl: 0 .  PARoxetine (PAXIL) 20 MG tablet, Take 1 tablet (20 mg total) by mouth daily., Disp: 30 tablet, Rfl: 0 .  rivaroxaban (XARELTO) 20 MG TABS tablet, Take 1 tablet (20 mg total) by mouth daily with supper., Disp: 30 tablet, Rfl: 1 .  vitamin C (VITAMIN C) 250 MG tablet, Take 1 tablet (250 mg total) by mouth daily with breakfast., Disp: , Rfl:   Social History: She is a widow.  2 healthy children.  One grandchild.  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  she has never smoked. she does not drink alcohol or use drugs.  Family History: See HPI  Review of Systems: See  HPI She is currently sedentary.  Spending most of the day in a chair.  Physical therapy got interrupted and she currently does not have reliable transportation and cannot drive. She is not having headaches and has not had problems with headaches in the past.  No change in vision. Appetite is good.  Weight going up.  No dysphagia.  No dyspnea, chest pain, palpitations, change in bowel habit, hematochezia, melena, hematuria.  No vaginal bleeding.  She is having chronic discomfort in her left shoulder. She has not kept up with health maintenance exams and has never had a colonoscopy and cannot remember the last time she had a mammogram. Remaining ROS negative.  Physical Exam: Blood pressure 124/82, pulse 60, temperature 97.9 F (36.6 C), temperature source Oral, height 5\' 5"  (1.651 m), weight 212 lb 4.8 oz (  96.3 kg), SpO2 100 %. Wt Readings from Last 3 Encounters:  10/22/16 212 lb 4.8 oz (96.3 kg)  07/24/16 189 lb 9.5 oz (86 kg)  07/19/16 200 lb 2.8 oz (90.8 kg)     General appearance: Well-nourished African-American woman HENNT: Slight proptosis.  Missing a few teeth.  Pharynx no erythema, exudate, mass, or ulcer. No thyromegaly or thyroid nodules.  Status post thyroidectomy.  Midline cervical scar. Lymph nodes: No cervical, supraclavicular, or axillary lymphadenopathy Breasts: Lungs: Clear to auscultation, resonant to percussion throughout Heart: Regular rhythm, no murmur, no gallop, no rub, no click, no edema Abdomen: Soft, nontender, normal bowel sounds, no mass, no organomegaly Extremities: No edema, no calf tenderness Musculoskeletal: no joint deformities GU: Vascular: Carotid pulses 2+, no bruits, Neurologic: Alert, oriented, PERRLA, optic discs sharp and vessels normal, no hemorrhage or exudate, cranial nerves grossly normal, motor strength 5 over 5, reflexes except trace weakness in extension of the right foot 1+ symmetric, upper body coordination normal, gait not tested Skin: No  rash or ecchymosis    Lab Results: Lab Results  Component Value Date   WBC 3.9 (L) 08/16/2016   HGB 10.3 (L) 08/16/2016   HCT 32.9 (L) 08/16/2016   MCV 87.3 08/16/2016   PLT 260 08/16/2016     Chemistry      Component Value Date/Time   NA 139 08/16/2016 0516   NA 133 (A) 12/14/2014   K 4.4 08/16/2016 0516   CL 106 08/16/2016 0516   CO2 26 08/16/2016 0516   BUN 10 08/16/2016 0516   BUN 8 12/14/2014   CREATININE 0.89 08/16/2016 0516   GLU 94 12/14/2014      Component Value Date/Time   CALCIUM 8.4 (L) 08/16/2016 0516   ALKPHOS 87 07/25/2016 0738   AST 22 07/25/2016 0738   ALT 16 07/25/2016 0738   BILITOT 0.7 07/25/2016 0738         Radiological Studies: See above   Impression: 61 year old woman who has had, in my opinion, 2 provoked DVTs in the right leg, first after a long car ride under cramped conditions and the second after extensive spinal surgery.  There is no evidence to suggest that she has a "hypercoagulable state".  There is no reason to keep her on chronic anticoagulation. What was called a "stroke" were likely neurologic deficits from a ruptured cerebral aneurysm.  With respect to the aneurysm, this sounds like a familial problem which also occurred in her mother.  This being the case, I do not think that she needs to be on chronic aspirin either. I am going to screen her for the presence of antiphospholipid antibodies.  Lupus anticoagulant testing cannot be done while on the Xarelto.  If any of the antiphospholipid antibody levels are elevated, I will wait until she is off Xarelto to check a lupus anticoagulant.   Recommendation: I would continue her full dose anticoagulation at this time until she makes more progress with physical therapy and is ambulatory.  I am going to see her back again in 2 months to reevaluate.  I will either stop her anticoagulation completely at that time or make a dose reduction to 10 mg daily based on results of 2 recent clinical  trials looking at extended anticoagulation in patients either taking apixaban or rivaroxaban where 50% dose reduction after initial full anticoagulation for 6-12 months gave equivalent protection with no excessive bleeding.    Murriel Hopper, MD, [place]  Hematology-Oncology/Internal Medicine  10/22/2016, 5:06 PM

## 2016-10-24 LAB — CARDIOLIPIN ANTIBODIES, IGG, IGM, IGA: Anticardiolipin IgG: <9 GPL U/mL (ref 0–14)

## 2016-10-24 LAB — CBC WITH DIFFERENTIAL/PLATELET
Basophils Absolute: 0 10*3/uL (ref 0.0–0.2)
Basos: 0 %
EOS (ABSOLUTE): 0.1 10*3/uL (ref 0.0–0.4)
Eos: 2 %
Hematocrit: 36 % (ref 34.0–46.6)
Hemoglobin: 11.9 g/dL (ref 11.1–15.9)
IMMATURE GRANULOCYTES: 0 %
Immature Grans (Abs): 0 10*3/uL (ref 0.0–0.1)
Lymphocytes Absolute: 2 10*3/uL (ref 0.7–3.1)
Lymphs: 34 %
MCH: 29 pg (ref 26.6–33.0)
MCHC: 33.1 g/dL (ref 31.5–35.7)
MCV: 88 fL (ref 79–97)
MONOS ABS: 0.6 10*3/uL (ref 0.1–0.9)
Monocytes: 11 %
NEUTROS PCT: 53 %
Neutrophils Absolute: 3 10*3/uL (ref 1.4–7.0)
PLATELETS: 257 10*3/uL (ref 150–379)
RBC: 4.11 x10E6/uL (ref 3.77–5.28)
RDW: 18.1 % — AB (ref 12.3–15.4)
WBC: 5.7 10*3/uL (ref 3.4–10.8)

## 2016-10-24 LAB — BETA-2-GLYCOPROTEIN I ABS, IGG/M/A
Beta-2 Glyco 1 IgA: <9 GPI IgA units (ref 0–25)
Beta-2 Glyco 1 IgM: <9 GPI IgM units (ref 0–32)
Beta-2 Glyco I IgG: <9 GPI IgG units (ref 0–20)

## 2016-10-28 ENCOUNTER — Ambulatory Visit: Payer: BC Managed Care – PPO | Admitting: Rehabilitative and Restorative Service Providers"

## 2016-10-30 ENCOUNTER — Telehealth: Payer: Self-pay | Admitting: *Deleted

## 2016-10-30 NOTE — Telephone Encounter (Signed)
Error

## 2016-10-30 NOTE — Telephone Encounter (Signed)
Contacted patient to verify the need for SCAT application.  She has yet to hear any response. Consulted Dr. Posey Pronto and Zorita Pang. The application was filled out and sent. Awaiting approval from [employer] Patient contacted an notified

## 2016-11-01 ENCOUNTER — Ambulatory Visit: Payer: BC Managed Care – PPO | Attending: Physical Medicine & Rehabilitation

## 2016-11-01 DIAGNOSIS — R208 Other disturbances of skin sensation: Secondary | ICD-10-CM | POA: Insufficient documentation

## 2016-11-01 DIAGNOSIS — R2681 Unsteadiness on feet: Secondary | ICD-10-CM | POA: Diagnosis present

## 2016-11-01 DIAGNOSIS — R2689 Other abnormalities of gait and mobility: Secondary | ICD-10-CM | POA: Diagnosis present

## 2016-11-01 DIAGNOSIS — M6281 Muscle weakness (generalized): Secondary | ICD-10-CM | POA: Insufficient documentation

## 2016-11-01 NOTE — Therapy (Signed)
[facility] [address] [place], [place], [address] Phone: [phone]   Fax:  [phone]  Physical Therapy Evaluation  Patient Details  Name: Katrina Donaldson MRN: [id number] Date of Birth: [birth year] Referring Provider: Dr. Posey Pronto  Encounter Date: 11/01/2016      PT End of Session - 11/01/16 1558    Visit Number 1   Number of Visits 17   Date for PT Re-Evaluation 12/31/16   Authorization Type BCBS State, Medicare? G-CODE AND PROGRESS NOTE EVERY 10TH VISIT.    PT Start Time 1022   PT Stop Time 1054   PT Time Calculation (min) 32 min   Equipment Utilized During Treatment Gait belt   Activity Tolerance Patient tolerated treatment well   Behavior During Therapy WFL for tasks assessed/performed      Past Medical History:  Diagnosis Date  . Brain aneurysm   . Depression   . History of DVT (deep vein thrombosis)   . Hypercoagulable state ([place])   . Hypertension   . Hypothyroidism   . Stroke [facility]) 2016   [facility] 11/29/14    Past Surgical History:  Procedure Laterality Date  . ABDOMINAL HYSTERECTOMY     patient denies  . BRAIN SURGERY     aneurysm  . goiter    . LAMINECTOMY N/A 07/19/2016   Procedure: LAMINECTOMY THORACIC FOUR THORACIC FIVE, THORACIC SIX TO THORACIC EIGHT,THORACIC TEN TO LUMBAR ONE, FENESTRATION OF ARACHNOID CYSTS;  Surgeon: Consuella Lose, MD;  Location: [facility];  Service: Neurosurgery;  Laterality: N/A;  . THYROIDECTOMY      There were no vitals filed for this visit.       Subjective Assessment - 11/01/16 1029    Subjective Pt presenting with thoracic myelopathy s/p surgery for spinal arachnoid cyst (T11-L1) and T4-T7 laminectomy on 07/19/16 and received inpt rehab and HHPT. Pt last amb. with HHPT 3 weeks ago with RW and reports her balance is still not "normal" and she feels weak. Pt is IND with bathing, dressing, cooking and eating. Pt reports no issues with bed mobility.    Pertinent  History SAH, aneurym, HTN, DVT R iliac, hypothyroidism, R hydrocephalus, hypercoagulable state   Patient Stated Goals "I want to be walking again without support, I want to be back to where I was."   Currently in Pain? No/denies            [facility] PT Assessment - 11/01/16 1032      Assessment   Medical Diagnosis Thoracic myelopathy   Referring Provider Dr. Posey Pronto   Onset Date/Surgical Date 07/19/16   Hand Dominance Right   Prior Therapy Inpt and HHPT     Precautions   Precautions Fall     Restrictions   Weight Bearing Restrictions No     Balance Screen   Has the patient fallen in the past 6 months No   Has the patient had a decrease in activity level because of a fear of falling?  Yes   Is the patient reluctant to leave their home because of a fear of falling?  No     Home Social worker Private residence   Living Arrangements Alone   Available Help at Discharge Family;Friend(s)   Type of [place] Access Level entry   [place] One level   Home Equipment Wheelchair - manual;Walker - 2 wheels;Cane - single point;Bedside commode;Tub bench     Prior Function   Level of Independence Independent  Vocation Retired   Counsellor (line dancing), go to music in the park and to movies, get together with family, gardening     Cognition   Overall Cognitive Status Within Functional Limits for tasks assessed     Sensation   Light Touch Impaired by gross assessment   Additional Comments N/T in B toes (constant). Pt reported decr. light touch during testing on RLE (distal to knee).     Coordination   Gross Motor Movements are Fluid and Coordinated Yes   Fine Motor Movements are Fluid and Coordinated No  Finger to thumb and RAMs WNL   Heel Shin Test Decr. range and speed 2/2 B LE weakness     Posture/Postural Control   Posture/Postural Control Postural limitations   Postural Limitations Forward head;Posterior pelvic tilt  while seated in  manual w/cc     Tone   Assessment Location Left Lower Extremity;Right Lower Extremity  pt reports intermittent BLE spasms     ROM / Strength   AROM / PROM / Strength AROM;Strength     AROM   Overall AROM  Deficits   Overall AROM Comments BUE and BLE AROM WFL except for decr. B hip flexion 2/2 weakness     Strength   Overall Strength Deficits   Overall Strength Comments RLE: hip flex: 3+/5, knee ext: 4/5, knee flex: 3+/5, ankle DF: 3+/5. LLE: hip flex: 4/5, knee ext: 4/5, knee flex: 4/5, ankle DF: 3+/5. B hip abd in sitting: 3+/5 and B hip add: in sitting: 4/5.     Transfers   Transfers Sit to Stand;Stand to Sit   Sit to Stand 4: Min assist;4: Min guard;With upper extremity assist;Other (comment)  manual w/c   Sit to Stand Details Tactile cues for sequencing;Tactile cues for placement;Verbal cues for technique;Verbal cues for sequencing   Sit to Stand Details (indicate cue type and reason) Cues to improve ant. weight shifting and for hand placement on RW and w/c.    Stand to Sit 4: Min guard;With upper extremity assist;To chair/3-in-1;Uncontrolled descent     Ambulation/Gait   Ambulation/Gait Yes   Ambulation/Gait Assistance 4: Min guard;4: Min assist   Ambulation/Gait Assistance Details Cues to improve upright posture, sequencing, and toe clearance.  Pt became fatigued quickly and required seated rest break, therefore, gait speed not obtained.    Ambulation Distance (Feet) 10 Feet   Assistive device Rolling walker   Gait Pattern Step-through pattern;Decreased stride length;Decreased dorsiflexion - right;Decreased dorsiflexion - left;Shuffle;Decreased trunk rotation;Trunk flexed  intermittent B knees flexed in stance   Ambulation Surface Level;Indoor     Chief Technology Officer Yes   Wheelchair Assistance 6: Modified independent (Device/Increase time)   Environmental health practitioner Both upper extremities   Wheelchair Parts Management Independent   Distance 100      Balance   Balance Assessed Yes     Corporate treasurer Standing - Balance Support No upper extremity supported   Static Standing - Level of Assistance 4: Min assist   Static Standing - Comment/# of Minutes Pt able to maintain static standing balance with feet apart for 5 seconds prior to requiring 1 UE support on RW and min A 2/2 LOB.     RLE Tone   RLE Tone Within Functional Limits     LLE Tone   LLE Tone Within Functional Limits  PT Education - 11/01/16 1558    Education provided Yes   Education Details PT discussed exam findings, POC, and frequency/duration.    Person(s) Educated Patient   Methods Explanation   Comprehension Verbalized understanding          PT Short Term Goals - 11/01/16 1605      PT SHORT TERM GOAL #1   Title Pt will be IND with HEP to improve strength, endurance, balance, and flexibility. TARGET DATE FOR ALL STGs: 11/29/16   Status New     PT SHORT TERM GOAL #2   Title Pt will perform STS txfs at MOD I level with RW to improve functional mobility.    Status New     PT SHORT TERM GOAL #3   Title Pt will amb. 100' with RW, at MOD I level, over even terrain to safely amb. at home.    Status New     PT SHORT TERM GOAL #4   Title Assess BERG and TUG and write goal once appropriate   Status New           PT Long Term Goals - 11/01/16 1607      PT LONG TERM GOAL #1   Title Pt will amb. 300' over even and paved surfaces, LRAD, at MOD I level, in order to amb. to/from MD appointments.   Status New     PT LONG TERM GOAL #2   Title Pt will perform STS txfs at MOD I level to improve safety during functional mobility.   Status New     PT LONG TERM GOAL #3   Title Pt will perform stand pivot txfs to/from chair to bed with RW at MOD I level to improve functional mobility.    Status New     PT LONG TERM GOAL #4   Title Pt will verbalize fall prevention strategies to reduce falls risk.     Status New               Plan - 11/01/16 1600    Clinical Impression Statement Pt is a pleasant 60y/o female presenting to OPPT neuro with myelopathy s/p surgery for spinal arachnoid cyct on 07/19/16. Pt's PMH significant for the following: SAH, aneurym, HTN, DVT R iliac, hypothyroidism, R hydrocephalus, hypercoagulable state. The following impairment were found during exam: gait deviations, impaired strength, decreased endurance, impaired balance, impaired sensation, decr. coordination, decr. ROM, impaired flexibility and postural dysfunction. Pt unable to obtain gait speed, as pt unable to amb. >10' 2/2 fatigue. PT also unable to perform formal balance test 2/2 decr. endurance. Pt's lives alone, wich adds to complexity along with extensive medical hx. Pt currently uses a slide board for txfs, PT would like to incr. pt's standing tolerance by using RW to perform stand pivot txfs. PT will perform formal outcome measures as pt is able to tolerate. Pt would benefit from skilled PT to improve safety during functional mobility.    Rehab Potential Good   Clinical Impairments Affecting Rehab Potential extensive PMH   PT Frequency 2x / week   PT Duration 8 weeks   PT Treatment/Interventions ADLs/Self Care Home Management;Biofeedback;Canalith Repostioning;Electrical Stimulation;Neuromuscular re-education;Balance training;Therapeutic exercise;Manual techniques;Therapeutic activities;Functional mobility training;Gait training;DME Instruction;Orthotic Fit/Training;Patient/family education;Vestibular;Stair training   PT Next Visit Plan Initiate strength, balance, and flexibility HEP. Provide falls risk handout education   Consulted and Agree with Plan of Care Patient      Patient will benefit from skilled therapeutic intervention in order to improve the following deficits  and impairments:  Abnormal gait, Decreased endurance, Impaired sensation, Decreased strength, Decreased knowledge of use of DME,  Decreased balance, Decreased mobility, Decreased range of motion, Decreased coordination, Impaired flexibility, Postural dysfunction  Visit Diagnosis: Other abnormalities of gait and mobility - Plan: PT plan of care cert/re-cert  Muscle weakness (generalized) - Plan: PT plan of care cert/re-cert  Other disturbances of skin sensation - Plan: PT plan of care cert/re-cert  Unsteadiness on feet - Plan: PT plan of care cert/re-cert      G-Codes - 69/62/95 1611    Functional Assessment Tool Used (Outpatient Only) Min A during STS txfs and 10' of ambulation with RW, feet apart without UE support for 5 seconds.   Functional Limitation Mobility: Walking and moving around   Mobility: Walking and Moving Around Current Status [phone]) At least 60 percent but less than 80 percent impaired, limited or restricted   Mobility: Walking and Moving Around Goal Status [phone]) At least 20 percent but less than 40 percent impaired, limited or restricted       Problem List Patient Active Problem List   Diagnosis Date Noted  . Urinary retention   . Acute pain of right knee   . Acute lower UTI   . Weakness of right lower extremity   . Confusion   . Hydrocephalus   . Poor appetite   . Acute blood loss anemia   . Post-operative pain   . Hypokalemia   . Leukocytosis   . Thrombocytopenia ([place])   . Acute deep vein thrombosis (DVT) of femoral vein of left lower extremity ([place])   . Myelopathy ([place]) 07/24/2016  . Thoracic myelopathy   . Depression   . Benign essential HTN   . History of subarachnoid hemorrhage   . Hypercoagulable state ([place])   . Constipation due to pain medication   . 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 Protein-calorie malnutrition ([place]) 12/21/2014  . Thyroid activity decreased 12/21/2014  . 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 Essential hypertension 12/13/2014  . Anemia, iron deficiency 12/13/2014  . Hypothyroidism 12/13/2014  .  Prediabetes 12/13/2014    Katrina Donaldson 11/01/2016, 4:12 PM  [facility] [address] [place], [place], [address] Phone: [phone]   Fax:  [phone]  Name: Katrina Donaldson MRN: [id number] Date of Birth: 1956/03/12  Geoffry Paradise, PT,DPT 11/01/16 4:13 PM Phone: [phone] Fax: [phone]

## 2016-11-04 ENCOUNTER — Ambulatory Visit: Payer: BC Managed Care – PPO | Admitting: Physical Therapy

## 2016-11-08 ENCOUNTER — Ambulatory Visit: Payer: BC Managed Care – PPO | Admitting: Physical Therapy

## 2016-11-08 ENCOUNTER — Encounter: Payer: Self-pay | Admitting: Physical Therapy

## 2016-11-08 DIAGNOSIS — R2681 Unsteadiness on feet: Secondary | ICD-10-CM

## 2016-11-08 DIAGNOSIS — R208 Other disturbances of skin sensation: Secondary | ICD-10-CM

## 2016-11-08 DIAGNOSIS — R2689 Other abnormalities of gait and mobility: Secondary | ICD-10-CM

## 2016-11-08 DIAGNOSIS — M6281 Muscle weakness (generalized): Secondary | ICD-10-CM

## 2016-11-08 NOTE — Therapy (Signed)
[facility] [address] [place], [place], [address] Phone: [phone]   Fax:  [phone]  Physical Therapy Treatment  Patient Details  Name: Katrina Donaldson MRN: [id number] Date of Birth: 09-Aug-1955 Referring Provider: Dr. Posey Pronto  Encounter Date: 11/08/2016      PT End of Session - 11/08/16 0852    Visit Number 2   Number of Visits 17   Date for PT Re-Evaluation 12/31/16   Authorization Type BCBS State, Medicare? G-CODE AND PROGRESS NOTE EVERY 10TH VISIT.    PT Start Time [phone]   PT Stop Time 0930   PT Time Calculation (min) 41 min   Equipment Utilized During Treatment Gait belt   Activity Tolerance Patient tolerated treatment well   Behavior During Therapy WFL for tasks assessed/performed      Past Medical History:  Diagnosis Date  . Brain aneurysm   . Depression   . History of DVT (deep vein thrombosis)   . Hypercoagulable state ([place])   . Hypertension   . Hypothyroidism   . Stroke [facility]) 2016   [facility] 11/29/14    Past Surgical History:  Procedure Laterality Date  . ABDOMINAL HYSTERECTOMY     patient denies  . BRAIN SURGERY     aneurysm  . goiter    . LAMINECTOMY N/A 07/19/2016   Procedure: LAMINECTOMY THORACIC FOUR THORACIC FIVE, THORACIC SIX TO THORACIC EIGHT,THORACIC TEN TO LUMBAR ONE, FENESTRATION OF ARACHNOID CYSTS;  Surgeon: Consuella Lose, MD;  Location: [facility];  Service: Neurosurgery;  Laterality: N/A;  . THYROIDECTOMY      There were no vitals filed for this visit.      Subjective Assessment - 11/08/16 0852    Subjective No new complaints. No falls or pain to report.    Pertinent History SAH, aneurym, HTN, DVT R iliac, hypothyroidism, R hydrocephalus, hypercoagulable state   Patient Stated Goals "I want to be walking again without support, I want to be back to where I was."   Currently in Pain? No/denies             [facility] Adult PT Treatment/Exercise - 11/08/16 2111      Bed  Mobility   Bed Mobility Sit to Supine;Supine to Sit   Supine to Sit 3: Mod assist   Supine to Sit Details (indicate cue type and reason) cues on sequencing and technique. assistance needed to elevate trunk into seated position and bring legs off edge of mat   Sit to Supine 3: Mod assist   Sit to Supine - Details (indicate cue type and reason) cues on technique. assistance needed to clear mat surface with LE's     Transfers   Transfers Sit to Stand;Stand to Sit;Stand Pivot Transfers   Sit to Stand 4: Min assist;3: Mod assist;With upper extremity assist;From bed;From chair/3-in-1   Sit to Stand Details Tactile cues for sequencing;Tactile cues for placement;Verbal cues for technique;Verbal cues for sequencing   Sit to Stand Details (indicate cue type and reason) cues to scoot to edge of seat surface to stand. increased time needed with all stands. increased assitance needed with standing from mat table vs wheelchair.    Stand to Sit With upper extremity assist;4: Min assist;3: Mod assist;To bed;To chair/3-in-1   Stand to Sit Details (indicate cue type and reason) Verbal cues for sequencing;Verbal cues for technique;Verbal cues for precautions/safety;Verbal cues for safe use of DME/AE;Manual facilitation for placement;Manual facilitation for weight shifting;Tactile cues for weight shifting   Stand to Sit Details uncontrolled  descent to mat table with mod assist to ensure safe sitting; min assist to wheelchair with pt reaching back (after cues) to sit down with more controlled descent.                 added the following to HEP:   Perform these while lying in your bed or sofa: Bridge    Lie back, legs bent. Press down on bed with legs/arms and lift hips up as high as you can. Slowly lower hips back down.  Repeat __10__ times. Do _1-2_ sessions per day.  http://pm.exer.us/55   Copyright  VHI. All rights reserved.    Bent Leg Lift (Hook-Lying)    Tighten stomach and slowly raise right  leg __2-3__ inches from floor. Keep trunk rigid. Slowly lower foot back to bed. Repeat with left leg.  Repeat __10__ times each leg.  Do _1-2_ sessions per day.  http://orth.exer.us/1091   Copyright  VHI. All rights reserved.    Strengthening: Hip Abductor - Resisted    LYING FLAT: With band looped around both legs above knees, push thighs apart. Hold for 5 seconds. Repeat _10_ times per set. Do _1_ sets per session. Do _1-2_ sessions per day.  http://orth.exer.us/688   Copyright  VHI. All rights reserved.   Perform these while seated in wheelchair or other surface with feet on floor:     Sitting Chair Flexion    Bring knee up toward chest. NO WEIGHT. Repeat with other leg, alternating legs. Repeat _10_ times each leg. Do __1-2__ sessions per day.  http://gt2.exer.us/391   Copyright  VHI. All rights reserved.    Knee Extension: Resisted (Sitting)    With band looped around both ankles, straighten one leg out. Keep other leg bent to increase resistance. Repeat _10_ times each leg. Do _1_ sets per session. Do _1-2_ sessions per day.  http://orth.exer.us/691   Copyright  VHI. All rights reserved.   Knee Flexion: Resisted (Sitting)    Sit with band around both ankles, prop one foot on a foot stool.  Pull unsupported leg back, bending knee. Slowly let that leg straighten back out. Repeat _10_ times each  leg.  Do _1-2_ sessions per day.  http://orth.exer.us/695   Copyright  VHI. All rights reserved.          PT Education - 11/08/16 0926    Education provided Yes   Education Details HEP for LE strengthening in supine and sitting   Person(s) Educated Patient   Methods Explanation;Demonstration;Verbal cues;Handout   Comprehension Verbalized understanding;Returned demonstration;Verbal cues required;Need further instruction          PT Short Term Goals - 11/01/16 1605      PT SHORT TERM GOAL #1   Title Pt will be IND with HEP to improve  strength, endurance, balance, and flexibility. TARGET DATE FOR ALL STGs: 11/29/16   Status New     PT SHORT TERM GOAL #2   Title Pt will perform STS txfs at MOD I level with RW to improve functional mobility.    Status New     PT SHORT TERM GOAL #3   Title Pt will amb. 100' with RW, at MOD I level, over even terrain to safely amb. at home.    Status New     PT SHORT TERM GOAL #4   Title Assess BERG and TUG and write goal once appropriate   Status New           PT Long Term Goals - 11/01/16 1607  PT LONG TERM GOAL #1   Title Pt will amb. 300' over even and paved surfaces, LRAD, at MOD I level, in order to amb. to/from MD appointments.   Status New     PT LONG TERM GOAL #2   Title Pt will perform STS txfs at MOD I level to improve safety during functional mobility.   Status New     PT LONG TERM GOAL #3   Title Pt will perform stand pivot txfs to/from chair to bed with RW at MOD I level to improve functional mobility.    Status New     PT LONG TERM GOAL #4   Title Pt will verbalize fall prevention strategies to reduce falls risk.    Status New           Plan - 11/08/16 0853    Clinical Impression Statement today's skilled session continued to work on transfers and issued HEP for strengthening with no issues reported. Pt continues to need increased assistance with standing/transfers due to LE weakness. Pt is progressing towards goals and should benefit from continued PT to progress toward unmet goals.                  Rehab Potential Good   Clinical Impairments Affecting Rehab Potential extensive PMH   PT Frequency 2x / week   PT Duration 8 weeks   PT Treatment/Interventions ADLs/Self Care Home Management;Biofeedback;Canalith Repostioning;Electrical Stimulation;Neuromuscular re-education;Balance training;Therapeutic exercise;Manual techniques;Therapeutic activities;Functional mobility training;Gait training;DME Instruction;Orthotic Fit/Training;Patient/family  education;Vestibular;Stair training   PT Next Visit Plan Initiate balance and flexibility HEP. Provide falls risk handout education   Consulted and Agree with Plan of Care Patient      Patient will benefit from skilled therapeutic intervention in order to improve the following deficits and impairments:  Abnormal gait, Decreased endurance, Impaired sensation, Decreased strength, Decreased knowledge of use of DME, Decreased balance, Decreased mobility, Decreased range of motion, Decreased coordination, Impaired flexibility, Postural dysfunction  Visit Diagnosis: Other abnormalities of gait and mobility  Muscle weakness (generalized)  Other disturbances of skin sensation  Unsteadiness on feet     Problem List Patient Active Problem List   Diagnosis Date Noted  . Urinary retention   . Acute pain of right knee   . Acute lower UTI   . Weakness of right lower extremity   . Confusion   . Hydrocephalus   . Poor appetite   . Acute blood loss anemia   . Post-operative pain   . Hypokalemia   . Leukocytosis   . Thrombocytopenia ([place])   . Acute deep vein thrombosis (DVT) of femoral vein of left lower extremity ([place])   . Myelopathy ([place]) 07/24/2016  . Thoracic myelopathy   . Depression   . Benign essential HTN   . History of subarachnoid hemorrhage   . Hypercoagulable state ([place])   . Constipation due to pain medication   . 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 Protein-calorie malnutrition ([place]) 12/21/2014  . Thyroid activity decreased 12/21/2014  . 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 Essential hypertension 12/13/2014  . Anemia, iron deficiency 12/13/2014  . Hypothyroidism 12/13/2014  . Prediabetes 12/13/2014    Willow Ora, PTA, [facility] [address] [place], [place] [address] [phone] 11/08/16, 9:21 PM   Name: Katrina Donaldson MRN: [id number] Date of Birth: 1955-08-11

## 2016-11-08 NOTE — Patient Instructions (Addendum)
Perform these while lying in your bed or sofa: Bridge    Lie back, legs bent. Press down on bed with legs/arms and lift hips up as high as you can. Slowly lower hips back down.  Repeat __10__ times. Do _1-2_ sessions per day.  http://pm.exer.us/55   Copyright  VHI. All rights reserved.    Bent Leg Lift (Hook-Lying)    Tighten stomach and slowly raise right leg __2-3__ inches from floor. Keep trunk rigid. Slowly lower foot back to bed. Repeat with left leg.  Repeat __10__ times each leg.  Do _1-2_ sessions per day.  http://orth.exer.us/1091   Copyright  VHI. All rights reserved.    Strengthening: Hip Abductor - Resisted    LYING FLAT: With band looped around both legs above knees, push thighs apart. Hold for 5 seconds. Repeat _10_ times per set. Do _1_ sets per session. Do _1-2_ sessions per day.  http://orth.exer.us/688   Copyright  VHI. All rights reserved.   Perform these while seated in wheelchair or other surface with feet on floor:     Sitting Chair Flexion    Bring knee up toward chest. NO WEIGHT. Repeat with other leg, alternating legs. Repeat _10_ times each leg. Do __1-2__ sessions per day.  http://gt2.exer.us/391   Copyright  VHI. All rights reserved.    Knee Extension: Resisted (Sitting)    With band looped around both ankles, straighten one leg out. Keep other leg bent to increase resistance. Repeat _10_ times each leg. Do _1_ sets per session. Do _1-2_ sessions per day.  http://orth.exer.us/691   Copyright  VHI. All rights reserved.   Knee Flexion: Resisted (Sitting)    Sit with band around both ankles, prop one foot on a foot stool.  Pull unsupported leg back, bending knee. Slowly let that leg straighten back out. Repeat _10_ times each  leg.  Do _1-2_ sessions per day.  http://orth.exer.us/695   Copyright  VHI. All rights reserved.

## 2016-11-12 ENCOUNTER — Ambulatory Visit: Payer: BC Managed Care – PPO | Admitting: Physical Therapy

## 2016-11-12 ENCOUNTER — Encounter: Payer: Self-pay | Admitting: Physical Therapy

## 2016-11-12 DIAGNOSIS — R2689 Other abnormalities of gait and mobility: Secondary | ICD-10-CM | POA: Diagnosis not present

## 2016-11-12 DIAGNOSIS — R2681 Unsteadiness on feet: Secondary | ICD-10-CM

## 2016-11-12 DIAGNOSIS — M6281 Muscle weakness (generalized): Secondary | ICD-10-CM

## 2016-11-12 DIAGNOSIS — R208 Other disturbances of skin sensation: Secondary | ICD-10-CM

## 2016-11-12 NOTE — Therapy (Signed)
[facility] [address] [place], [place], [address] Phone: [phone]   Fax:  [phone]  Physical Therapy Treatment  Patient Details  Name: Katrina Donaldson MRN: [id number] Date of Birth: [birth year] Referring Provider: Dr. Posey Pronto  Encounter Date: 11/12/2016      PT End of Session - 11/12/16 0938    Visit Number 3   Number of Visits 17   Date for PT Re-Evaluation 12/31/16   Authorization Type BCBS State, Medicare? G-CODE AND PROGRESS NOTE EVERY 10TH VISIT.    PT Start Time 0935   PT Stop Time 1015   PT Time Calculation (min) 40 min   Equipment Utilized During Treatment Gait belt   Activity Tolerance Patient tolerated treatment well   Behavior During Therapy WFL for tasks assessed/performed      Past Medical History:  Diagnosis Date  . Brain aneurysm   . Depression   . History of DVT (deep vein thrombosis)   . Hypercoagulable state ([place])   . Hypertension   . Hypothyroidism   . Stroke [facility]) 2016   [facility] 11/29/14    Past Surgical History:  Procedure Laterality Date  . ABDOMINAL HYSTERECTOMY     patient denies  . BRAIN SURGERY     aneurysm  . goiter    . LAMINECTOMY N/A 07/19/2016   Procedure: LAMINECTOMY THORACIC FOUR THORACIC FIVE, THORACIC SIX TO THORACIC EIGHT,THORACIC TEN TO LUMBAR ONE, FENESTRATION OF ARACHNOID CYSTS;  Surgeon: Consuella Lose, MD;  Location: [facility];  Service: Neurosurgery;  Laterality: N/A;  . THYROIDECTOMY      There were no vitals filed for this visit.      Subjective Assessment - 11/12/16 0938    Subjective No new complaints. No falls or pain to report. Does feel the ex's are making her sore.   Pertinent History SAH, aneurym, HTN, DVT R iliac, hypothyroidism, R hydrocephalus, hypercoagulable state   Patient Stated Goals "I want to be walking again without support, I want to be back to where I was."   Currently in Pain? No/denies          [facility] Adult PT Treatment/Exercise  - 11/12/16 0939      Transfers   Transfers Sit to Stand;Stand to Sit;Stand Pivot Transfers   Sit to Stand 4: Min assist;With upper extremity assist;From chair/3-in-1;With armrests   Sit to Stand Details Tactile cues for sequencing;Tactile cues for placement;Verbal cues for technique;Verbal cues for sequencing   Sit to Stand Details (indicate cue type and reason) cues to scoot closer to edge of surface prior to standing, along with cues for hand placement and weight shifting to stand.    Stand to Sit 3: Mod assist;With upper extremity assist;To bed   Stand to Sit Details (indicate cue type and reason) Verbal cues for sequencing;Verbal cues for technique;Verbal cues for precautions/safety;Verbal cues for safe use of DME/AE;Manual facilitation for placement;Manual facilitation for weight shifting;Tactile cues for weight shifting   Stand to Sit Details pt needed mod assist to sit safely to mat table with uncontrolled descent and mod assist of 2 for safetly sitting into wheelchair at end of session due to bil knee buckling with transfer                                                 Stand Pivot Transfers 3: Mod assist   Stand  Pivot Transfer Details (indicate cue type and reason) with RW wheelchair<>mat table with cues on sequencing and technique. assist needed for RW mangement with bouts of max assist during transfer due to knee buckling and uncontrolled sitting after each transfer due to knee buckling.     Knee/Hip Exercises: Seated   Hamstring Curl AROM;Strengthening;Both;1 set;10 reps;Limitations   Hamstring Limitations yellow band with right leg, red band with left leg. cues on technique and slow, controlled movements     Knee/Hip Exercises: Supine   Short Arc Quad Sets AROM;Strengthening;Both;1 set;10 reps;Limitations   Short Arc Quad Sets Limitations 2# ankle weight's on bil legs, cues on ex form and technqieu   Bridges AROM;Strengthening;Both;10 reps;Limitations;2 [employer] Limitations  cues on form and to lift hips as high as possible. assist needed for LE stability with ex's   Straight Leg Raises AAROM;Strengthening;Both;1 set;10 reps;Limitations   Straight Leg Raises Limitations cues on ex form and technique, assistance needed to maintain form     Ankle Exercises: Seated   Heel Raises 10 reps;5 seconds;Limitations   Heel Raises Limitations cues for hold times and to lift as high as possible   Toe Raise 10 reps;5 seconds;Limitations   Toe Raise Limitations cues on hold times and to lift as high as possible with each rep.           PT Short Term Goals - 11/01/16 1605      PT SHORT TERM GOAL #1   Title Pt will be IND with HEP to improve strength, endurance, balance, and flexibility. TARGET DATE FOR ALL STGs: 11/29/16   Status New     PT SHORT TERM GOAL #2   Title Pt will perform STS txfs at MOD I level with RW to improve functional mobility.    Status New     PT SHORT TERM GOAL #3   Title Pt will amb. 100' with RW, at MOD I level, over even terrain to safely amb. at home.    Status New     PT SHORT TERM GOAL #4   Title Assess BERG and TUG and write goal once appropriate   Status New           PT Long Term Goals - 11/01/16 1607      PT LONG TERM GOAL #1   Title Pt will amb. 300' over even and paved surfaces, LRAD, at MOD I level, in order to amb. to/from MD appointments.   Status New     PT LONG TERM GOAL #2   Title Pt will perform STS txfs at MOD I level to improve safety during functional mobility.   Status New     PT LONG TERM GOAL #3   Title Pt will perform stand pivot txfs to/from chair to bed with RW at MOD I level to improve functional mobility.    Status New     PT LONG TERM GOAL #4   Title Pt will verbalize fall prevention strategies to reduce falls risk.    Status New           Plan - 11/12/16 0939    Clinical Impression Statement Today's skilled session continued to work on transfers and LE strengthening without any issues  reported. Pt continues to be limited by LE weakness/instability in standing causing buckling of LE's at times. Pt is making progress and should benefit from continued PT to progress toward unmet goals.  Rehab Potential Good   Clinical Impairments Affecting Rehab Potential extensive PMH   PT Frequency 2x / week   PT Duration 8 weeks   PT Treatment/Interventions ADLs/Self Care Home Management;Biofeedback;Canalith Repostioning;Electrical Stimulation;Neuromuscular re-education;Balance training;Therapeutic exercise;Manual techniques;Therapeutic activities;Functional mobility training;Gait training;DME Instruction;Orthotic Fit/Training;Patient/family education;Vestibular;Stair training   PT Next Visit Plan Provide falls risk handout education., continue to work on LE strengthening; trial standing at sink and if stable work on strengthening/balance here.   Consulted and Agree with Plan of Care Patient      Patient will benefit from skilled therapeutic intervention in order to improve the following deficits and impairments:  Abnormal gait, Decreased endurance, Impaired sensation, Decreased strength, Decreased knowledge of use of DME, Decreased balance, Decreased mobility, Decreased range of motion, Decreased coordination, Impaired flexibility, Postural dysfunction  Visit Diagnosis: Other abnormalities of gait and mobility  Muscle weakness (generalized)  Other disturbances of skin sensation  Unsteadiness on feet     Problem List Patient Active Problem List   Diagnosis Date Noted  . Urinary retention   . Acute pain of right knee   . Acute lower UTI   . Weakness of right lower extremity   . Confusion   . Hydrocephalus   . Poor appetite   . Acute blood loss anemia   . Post-operative pain   . Hypokalemia   . Leukocytosis   . Thrombocytopenia ([place])   . Acute deep vein thrombosis (DVT) of femoral vein of left lower extremity ([place])   . Myelopathy ([place]) 07/24/2016  .  Thoracic myelopathy   . Depression   . Benign essential HTN   . History of subarachnoid hemorrhage   . Hypercoagulable state ([place])   . Constipation due to pain medication   . 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 Protein-calorie malnutrition ([place]) 12/21/2014  . Thyroid activity decreased 12/21/2014  . 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 Essential hypertension 12/13/2014  . Anemia, iron deficiency 12/13/2014  . Hypothyroidism 12/13/2014  . Prediabetes 12/13/2014    Willow Ora, PTA, [facility] [address], [place], [place] [address] [phone] 11/12/16, 10:13 PM   Name: Mayzee Reichenbach MRN: [id number] Date of Birth: May 17, 1956

## 2016-11-15 ENCOUNTER — Ambulatory Visit: Payer: BC Managed Care – PPO

## 2016-11-15 DIAGNOSIS — M6281 Muscle weakness (generalized): Secondary | ICD-10-CM

## 2016-11-15 DIAGNOSIS — R2689 Other abnormalities of gait and mobility: Secondary | ICD-10-CM

## 2016-11-15 DIAGNOSIS — R2681 Unsteadiness on feet: Secondary | ICD-10-CM

## 2016-11-15 NOTE — Patient Instructions (Addendum)
Perform at kitchen sink with hands on sink for safety, and chair behind you:   Weight Shift: Anterior / Posterior (Limits of Stability)    Slowly shift weight backward until toes begin to rise off floor. Return to starting position. Shift weight slowly forward until heels begin to rise off floor. Hold each position __2__ seconds. Repeat _10___ times per session. Do __1__ sessions per day.  Copyright  VHI. All rights reserved.   Weight Shift: Lateral (Limits of Stability)    Slowly shift weight to right as far as possible, without taking a step. Return to starting position. Shift to opposite side. Hold each position __2__ seconds. Repeat __10__ times per session. Do __1__ sessions per day.   Copyright  VHI. All rights reserved.   Feet Apart, Varied Arm Positions - Eyes Open    With eyes open, feet shoulder width apart, hands on kitchen sink, look straight ahead at a stationary object. Hold __30__ seconds. Repeat __3__ times per session. Do __1__ sessions per day.  Copyright  VHI. All rights reserved.   Fall Prevention in the Home Falls can cause injuries and can affect people from all age groups. There are many simple things that you can do to make your home safe and to help prevent falls. What can I do on the outside of my home?  Regularly repair the edges of walkways and driveways and fix any cracks.  Remove high doorway thresholds.  Trim any shrubbery on the main path into your home.  Use bright outdoor lighting.  Clear walkways of debris and clutter, including tools and rocks.  Regularly check that handrails are securely fastened and in good repair. Both sides of any steps should have handrails.  Install guardrails along the edges of any raised decks or porches.  Have leaves, snow, and ice cleared regularly.  Use sand or salt on walkways during winter months.  In the garage, clean up any spills right away, including grease or oil spills. What can I do in the  bathroom?  Use night lights.  Install grab bars by the toilet and in the tub and shower. Do not use towel bars as grab bars.  Use non-skid mats or decals on the floor of the tub or shower.  If you need to sit down while you are in the shower, use a plastic, non-slip stool.  Keep the floor dry. Immediately clean up any water that spills on the floor.  Remove soap buildup in the tub or shower on a regular basis.  Attach bath mats securely with double-sided non-slip rug tape.  Remove throw rugs and other tripping hazards from the floor. What can I do in the bedroom?  Use night lights.  Make sure that a bedside light is easy to reach.  Do not use oversized bedding that drapes onto the floor.  Have a firm chair that has side arms to use for getting dressed.  Remove throw rugs and other tripping hazards from the floor. What can I do in the kitchen?  Clean up any spills right away.  Avoid walking on wet floors.  Place frequently used items in easy-to-reach places.  If you need to reach for something above you, use a sturdy step stool that has a grab bar.  Keep electrical cables out of the way.  Do not use floor polish or wax that makes floors slippery. If you have to use wax, make sure that it is non-skid floor wax.  Remove throw rugs and other tripping  hazards from the floor. What can I do in the stairways?  Do not leave any items on the stairs.  Make sure that there are handrails on both sides of the stairs. Fix handrails that are broken or loose. Make sure that handrails are as long as the stairways.  Check any carpeting to make sure that it is firmly attached to the stairs. Fix any carpet that is loose or worn.  Avoid having throw rugs at the top or bottom of stairways, or secure the rugs with carpet tape to prevent them from moving.  Make sure that you have a light switch at the top of the stairs and the bottom of the stairs. If you do not have them, have them  installed. What are some other fall prevention tips?  Wear closed-toe shoes that fit well and support your feet. Wear shoes that have rubber soles or low heels.  When you use a stepladder, make sure that it is completely opened and that the sides are firmly locked. Have someone hold the ladder while you are using it. Do not climb a closed stepladder.  Add color or contrast paint or tape to grab bars and handrails in your home. Place contrasting color strips on the first and last steps.  Use mobility aids as needed, such as canes, walkers, scooters, and crutches.  Turn on lights if it is dark. Replace any light bulbs that burn out.  Set up furniture so that there are clear paths. Keep the furniture in the same spot.  Fix any uneven floor surfaces.  Choose a carpet design that does not hide the edge of steps of a stairway.  Be aware of any and all pets.  Review your medicines with your healthcare provider. Some medicines can cause dizziness or changes in blood pressure, which increase your risk of falling. Talk with your health care provider about other ways that you can decrease your risk of falls. This may include working with a physical therapist or trainer to improve your strength, balance, and endurance. This information is not intended to replace advice given to you by your health care provider. Make sure you discuss any questions you have with your health care provider. Document Released: 06/07/2002 Document Revised: 11/14/2015 Document Reviewed: 07/22/2014 Elsevier Interactive Patient Education  2017 [employer].

## 2016-11-15 NOTE — Therapy (Signed)
[facility] [address] [place], [place], [address] Phone: [phone]   Fax:  [phone]  Physical Therapy Treatment  Patient Details  Name: Katrina Donaldson MRN: [id number] Date of Birth: [birth year] Referring Provider: Dr. Posey Pronto  Encounter Date: 11/15/2016      PT End of Session - 11/15/16 1622    Visit Number 4   Number of Visits 17   Date for PT Re-Evaluation 12/31/16   Authorization Type BCBS State, Medicare? G-CODE AND PROGRESS NOTE EVERY 10TH VISIT.    PT Start Time 9937  pt late   PT Stop Time 1616   PT Time Calculation (min) 38 min   Equipment Utilized During Treatment --  min-mod A during STS txfs and min guard during balance activities   Activity Tolerance Patient tolerated treatment well   Behavior During Therapy WFL for tasks assessed/performed      Past Medical History:  Diagnosis Date  . Brain aneurysm   . Depression   . History of DVT (deep vein thrombosis)   . Hypercoagulable state ([place])   . Hypertension   . Hypothyroidism   . Stroke [facility]) 2016   [facility] 11/29/14    Past Surgical History:  Procedure Laterality Date  . ABDOMINAL HYSTERECTOMY     patient denies  . BRAIN SURGERY     aneurysm  . goiter    . LAMINECTOMY N/A 07/19/2016   Procedure: LAMINECTOMY THORACIC FOUR THORACIC FIVE, THORACIC SIX TO THORACIC EIGHT,THORACIC TEN TO LUMBAR ONE, FENESTRATION OF ARACHNOID CYSTS;  Surgeon: Consuella Lose, MD;  Location: [facility];  Service: Neurosurgery;  Laterality: N/A;  . THYROIDECTOMY      There were no vitals filed for this visit.      Subjective Assessment - 11/15/16 1541    Subjective Pt denied falls or changes since last visit.    Pertinent History SAH, aneurym, HTN, DVT R iliac, hypothyroidism, R hydrocephalus, hypercoagulable state   Patient Stated Goals "I want to be walking again without support, I want to be back to where I was."   Currently in Pain? No/denies                          [facility] Adult PT Treatment/Exercise - 11/15/16 1621      Transfers   Transfers Sit to Stand;Stand to Sit   Sit to Stand 4: Min assist;3: Mod assist;With upper extremity assist;From chair/3-in-1   Sit to Stand Details Tactile cues for sequencing;Tactile cues for placement;Verbal cues for technique;Verbal cues for sequencing   Sit to Stand Details (indicate cue type and reason) Cues to improve ant. weight shifting, scoot closer to edge of chair. Performed x5 reps.   Stand to Sit 4: Min assist;With upper extremity assist;To chair/3-in-1   Stand to Sit Details (indicate cue type and reason) Verbal cues for sequencing;Verbal cues for technique;Verbal cues for precautions/safety;Verbal cues for safe use of DME/AE;Manual facilitation for placement;Manual facilitation for weight shifting;Tactile cues for weight shifting   Stand to Sit Details Cues to improve eccentric control. Performed x5 reps.             Balance Exercises - 11/15/16 1618      Balance Exercises: Standing   Standing Eyes Opened Wide (BOA);Solid surface;3 reps;30 secs  UE support   Marching Limitations Pt performed marching at kitchen sink 4x5reps with BUE support and min guard. Cues and demo to improve posture, and lat. weight shifting.   Other Standing Exercises Pt  performed ant/post/lat weight shifting at kitchen sink with BUE support and cues for technique 3x10/activity. Cues and demo for technique. Pt required frequent rest breaks 2/2 fatigue. Please see pt instructions for HEP details. Marches not added to HEP for safety.      Self Care:     PT Education - 11/15/16 1620    Education provided Yes   Education Details PT provided pt with standing balance HEP. PT provided pt with falls risk handout education to reduce risk of falls.    Person(s) Educated Patient   Methods Explanation;Demonstration;Tactile cues;Verbal cues;Handout   Comprehension Returned demonstration;Verbalized  understanding;Need further instruction          PT Short Term Goals - 11/01/16 1605      PT SHORT TERM GOAL #1   Title Pt will be IND with HEP to improve strength, endurance, balance, and flexibility. TARGET DATE FOR ALL STGs: 11/29/16   Status New     PT SHORT TERM GOAL #2   Title Pt will perform STS txfs at MOD I level with RW to improve functional mobility.    Status New     PT SHORT TERM GOAL #3   Title Pt will amb. 100' with RW, at MOD I level, over even terrain to safely amb. at home.    Status New     PT SHORT TERM GOAL #4   Title Assess BERG and TUG and write goal once appropriate   Status New           PT Long Term Goals - 11/01/16 1607      PT LONG TERM GOAL #1   Title Pt will amb. 300' over even and paved surfaces, LRAD, at MOD I level, in order to amb. to/from MD appointments.   Status New     PT LONG TERM GOAL #2   Title Pt will perform STS txfs at MOD I level to improve safety during functional mobility.   Status New     PT LONG TERM GOAL #3   Title Pt will perform stand pivot txfs to/from chair to bed with RW at MOD I level to improve functional mobility.    Status New     PT LONG TERM GOAL #4   Title Pt will verbalize fall prevention strategies to reduce falls risk.    Status New               Plan - 11/15/16 1623    Clinical Impression Statement Pt progressed from mod A during STS txfs to min A with cues. Pt tolerated standing balance activities well, with B UE support, pt unable to trial standing with 1 UE 2/2 BLE weakness. PT provided cues to improve upright posture vs. trunk flexion, however, when pt activates glutes B knees begin to buckle. Pt would continue to benefit from skilled PT to improve safety during functional mobility.    Rehab Potential Good   Clinical Impairments Affecting Rehab Potential extensive PMH   PT Frequency 2x / week   PT Duration 8 weeks   PT Treatment/Interventions ADLs/Self Care Home  Management;Biofeedback;Canalith Repostioning;Electrical Stimulation;Neuromuscular re-education;Balance training;Therapeutic exercise;Manual techniques;Therapeutic activities;Functional mobility training;Gait training;DME Instruction;Orthotic Fit/Training;Patient/family education;Vestibular;Stair training   PT Next Visit Plan continue to work on LE strengthening and balance.   PT Home Exercise Plan Balance HEP   Consulted and Agree with Plan of Care Patient      Patient will benefit from skilled therapeutic intervention in order to improve the following deficits and impairments:  Abnormal  gait, Decreased endurance, Impaired sensation, Decreased strength, Decreased knowledge of use of DME, Decreased balance, Decreased mobility, Decreased range of motion, Decreased coordination, Impaired flexibility, Postural dysfunction  Visit Diagnosis: Other abnormalities of gait and mobility  Unsteadiness on feet  Muscle weakness (generalized)     Problem List Patient Active Problem List   Diagnosis Date Noted  . Urinary retention   . Acute pain of right knee   . Acute lower UTI   . Weakness of right lower extremity   . Confusion   . Hydrocephalus   . Poor appetite   . Acute blood loss anemia   . Post-operative pain   . Hypokalemia   . Leukocytosis   . Thrombocytopenia ([place])   . Acute deep vein thrombosis (DVT) of femoral vein of left lower extremity ([place])   . Myelopathy ([place]) 07/24/2016  . Thoracic myelopathy   . Depression   . Benign essential HTN   . History of subarachnoid hemorrhage   . Hypercoagulable state ([place])   . Constipation due to pain medication   . 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 Protein-calorie malnutrition ([place]) 12/21/2014  . Thyroid activity decreased 12/21/2014  . 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 Essential hypertension 12/13/2014  . Anemia, iron deficiency 12/13/2014  . Hypothyroidism 12/13/2014  . Prediabetes  12/13/2014    Cary Wilford L 11/15/2016, 4:25 PM  [facility] [address] [place], [place], [address] Phone: [phone]   Fax:  [phone]  Name: Meoshia Billing MRN: [id number] Date of Birth: December 15, 1955  Geoffry Paradise, PT,DPT 11/15/16 4:26 PM Phone: [phone] Fax: [phone]

## 2016-11-20 ENCOUNTER — Ambulatory Visit: Payer: BC Managed Care – PPO

## 2016-11-21 ENCOUNTER — Ambulatory Visit: Payer: BC Managed Care – PPO | Admitting: Physical Therapy

## 2016-11-22 ENCOUNTER — Encounter: Payer: Self-pay | Admitting: Physical Therapy

## 2016-11-22 ENCOUNTER — Ambulatory Visit: Payer: BC Managed Care – PPO | Admitting: Physical Therapy

## 2016-11-22 DIAGNOSIS — R2681 Unsteadiness on feet: Secondary | ICD-10-CM

## 2016-11-22 DIAGNOSIS — R2689 Other abnormalities of gait and mobility: Secondary | ICD-10-CM

## 2016-11-22 DIAGNOSIS — M6281 Muscle weakness (generalized): Secondary | ICD-10-CM

## 2016-11-22 DIAGNOSIS — R208 Other disturbances of skin sensation: Secondary | ICD-10-CM

## 2016-11-22 NOTE — Therapy (Signed)
[facility] [address] [place], [place], [address] Phone: [phone]   Fax:  [phone]  Physical Therapy Treatment  Patient Details  Name: Katrina Donaldson MRN: [id number] Date of Birth: 1956-02-01 Referring Provider: Dr. Posey Pronto  Encounter Date: 11/22/2016      PT End of Session - 11/22/16 1251    Visit Number 5   Number of Visits 17   Date for PT Re-Evaluation 12/31/16   Authorization Type BCBS State, Medicare? G-CODE AND PROGRESS NOTE EVERY 10TH VISIT.    PT Start Time 1020   PT Stop Time 1106   PT Time Calculation (min) 46 min   Activity Tolerance Patient tolerated treatment well   Behavior During Therapy WFL for tasks assessed/performed      Past Medical History:  Diagnosis Date  . Brain aneurysm   . Depression   . History of DVT (deep vein thrombosis)   . Hypercoagulable state ([place])   . Hypertension   . Hypothyroidism   . Stroke [facility]) 2016   [facility] 11/29/14    Past Surgical History:  Procedure Laterality Date  . ABDOMINAL HYSTERECTOMY     patient denies  . BRAIN SURGERY     aneurysm  . goiter    . LAMINECTOMY N/A 07/19/2016   Procedure: LAMINECTOMY THORACIC FOUR THORACIC FIVE, THORACIC SIX TO THORACIC EIGHT,THORACIC TEN TO LUMBAR ONE, FENESTRATION OF ARACHNOID CYSTS;  Surgeon: Consuella Lose, MD;  Location: [facility];  Service: Neurosurgery;  Laterality: N/A;  . THYROIDECTOMY      There were no vitals filed for this visit.      Subjective Assessment - 11/22/16 1024    Subjective Had transportation issues yesterday and was not having a good morning; pt anxious to start walking and having son come walk with her at home.   Pertinent History SAH, aneurym, HTN, DVT R iliac, hypothyroidism, R hydrocephalus, hypercoagulable state   Patient Stated Goals "I want to be walking again without support, I want to be back to where I was."   Currently in Pain? Yes   Pain Score 3    Pain Location Shoulder   Pain Orientation Left   Pain Descriptors / Indicators Aching                         [facility] Adult PT Treatment/Exercise - 11/22/16 1238      Transfers   Transfers Sit to Stand;Stand to Sit   Sit to Stand 3: Mod assist;With upper extremity assist;With armrests   Sit to Stand Details (indicate cue type and reason) verbal cues for hand placement, shoulder depression and UE/LE extension and to maintain upright gaze and trunk during anterior lean to shift COG forwards and promote UE and LE extension vs. pulling/flexion     Ambulation/Gait   Ambulation/Gait Yes   Ambulation/Gait Assistance 1: +2 Total assist  min-mod A on each side from each therapist   Ambulation/Gait Assistance Details gait with platform RW with harness and seat if pt does experience LE buckling; during gait focused on more upright posture to decrease WB through UE, activation of quads and glutes for increased extension in stance, full lateral weight shifting to allow full contralateral LE step length and clearance   Ambulation Distance (Feet) 115 Feet   Assistive device Bilateral platform walker   Gait Pattern Step-through pattern;Decreased step length - right;Decreased step length - left;Decreased stride length;Decreased weight shift to right;Decreased weight shift to left;Left flexed knee in stance;Right genu  recurvatum;Trunk flexed;Narrow base of support;Poor foot clearance - left;Poor foot clearance - right   Ambulation Surface Indoor;Level   Pre-Gait Activities Standing with bilat UE support on RW and then standing with UE support on sink with focus on shoulder depression to decrease tension on neck/shoulders, lateral weight shifting and activation of LE extensors to prevent LE buckling and recurvatum during alternating heel lifts and then alternating open chain hip extension x 5 reps                PT Education - 11/22/16 1250    Education provided Yes   Education Details lowered leg rests on w/c  to promote improved seating positioning and educated pt on shoulder position in chair (decrease elevation) to reduce hip flexor tightness and shoulder/neck pain and tightness   Person(s) Educated Patient   Methods Explanation   Comprehension Verbalized understanding          PT Short Term Goals - 11/01/16 1605      PT SHORT TERM GOAL #1   Title Pt will be IND with HEP to improve strength, endurance, balance, and flexibility. TARGET DATE FOR ALL STGs: 11/29/16   Status New     PT SHORT TERM GOAL #2   Title Pt will perform STS txfs at MOD I level with RW to improve functional mobility.    Status New     PT SHORT TERM GOAL #3   Title Pt will amb. 100' with RW, at MOD I level, over even terrain to safely amb. at home.    Status New     PT SHORT TERM GOAL #4   Title Assess BERG and TUG and write goal once appropriate   Status New           PT Long Term Goals - 11/01/16 1607      PT LONG TERM GOAL #1   Title Pt will amb. 300' over even and paved surfaces, LRAD, at MOD I level, in order to amb. to/from MD appointments.   Status New     PT LONG TERM GOAL #2   Title Pt will perform STS txfs at MOD I level to improve safety during functional mobility.   Status New     PT LONG TERM GOAL #3   Title Pt will perform stand pivot txfs to/from chair to bed with RW at MOD I level to improve functional mobility.    Status New     PT LONG TERM GOAL #4   Title Pt will verbalize fall prevention strategies to reduce falls risk.    Status New               Plan - 11/22/16 1251    Clinical Impression Statement Treatment session today with continued focus on LE strengthening during standing/WB and pre-gait activities with increased attention to shoulder positioning and activation of shoulder depressor and extensor muscles.  Also performed gait training with bilat platform RW with harness and seat in case of LE buckling and to allow therapist to provide increased facilitation.  Pt  continues to demonstrated significantly flexed trunk and rounded shoulders promoting genu recurvatum in standing; will continue to address with stretching, strengthening and pre-gait/gait activities as well as education about seating/positioning in w/c.   Rehab Potential Good   Clinical Impairments Affecting Rehab Potential extensive PMH   PT Treatment/Interventions ADLs/Self Care Home Management;Biofeedback;Canalith Repostioning;Electrical Stimulation;Neuromuscular re-education;Balance training;Therapeutic exercise;Manual techniques;Therapeutic activities;Functional mobility training;Gait training;DME Instruction;Orthotic Fit/Training;Patient/family education;Vestibular;Stair training   PT Next Visit Plan stretching  to promote shoulder extension, depression and ER, stretch hip flexors, promote hip extension, hamstring activation; standing with improved upright posture to take weight off of UE, gait with platform RW with harness, weight shifting and WB through LE   PT Home Exercise Plan Balance HEP   Consulted and Agree with Plan of Care Patient      Patient will benefit from skilled therapeutic intervention in order to improve the following deficits and impairments:  Abnormal gait, Decreased endurance, Impaired sensation, Decreased strength, Decreased knowledge of use of DME, Decreased balance, Decreased mobility, Decreased range of motion, Decreased coordination, Impaired flexibility, Postural dysfunction  Visit Diagnosis: Other abnormalities of gait and mobility  Unsteadiness on feet  Muscle weakness (generalized)  Other disturbances of skin sensation     Problem List Patient Active Problem List   Diagnosis Date Noted  . Urinary retention   . Acute pain of right knee   . Acute lower UTI   . Weakness of right lower extremity   . Confusion   . Hydrocephalus   . Poor appetite   . Acute blood loss anemia   . Post-operative pain   . Hypokalemia   . Leukocytosis   .  Thrombocytopenia ([place])   . Acute deep vein thrombosis (DVT) of femoral vein of left lower extremity ([place])   . Myelopathy ([place]) 07/24/2016  . Thoracic myelopathy   . Depression   . Benign essential HTN   . History of subarachnoid hemorrhage   . Hypercoagulable state ([place])   . Constipation due to pain medication   . 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 Protein-calorie malnutrition ([place]) 12/21/2014  . Thyroid activity decreased 12/21/2014  . 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 Essential hypertension 12/13/2014  . Anemia, iron deficiency 12/13/2014  . Hypothyroidism 12/13/2014  . Prediabetes 12/13/2014   Raylene Everts, PT, DPT 11/22/16    12:59 PM    [facility] [address] [place], [place], [address] Phone: [phone]   Fax:  [phone]  Name: Katrina Donaldson MRN: [id number] Date of Birth: 26-Jan-1956

## 2016-11-26 ENCOUNTER — Ambulatory Visit: Payer: BC Managed Care – PPO

## 2016-11-26 DIAGNOSIS — R2689 Other abnormalities of gait and mobility: Secondary | ICD-10-CM

## 2016-11-26 DIAGNOSIS — R2681 Unsteadiness on feet: Secondary | ICD-10-CM

## 2016-11-26 DIAGNOSIS — M6281 Muscle weakness (generalized): Secondary | ICD-10-CM

## 2016-11-26 NOTE — Patient Instructions (Signed)
Perform these while lying in your bed or sofa: Bridge    Lie back, legs bent. Press down on bed with legs/arms and lift hips up as high as you can. Slowly lower hips back down.  Repeat __10__ times. Do _1-2_ sessions per day.  http://pm.exer.us/55   Copyright  VHI. All rights reserved.    Bent Leg Lift (Hook-Lying)    Tighten stomach and slowly raise right leg __2-3__ inches from floor. Keep trunk rigid. Slowly lower foot back to bed. Repeat with left leg.  Repeat __10__ times each leg.  Do _1_ sessions per day.  http://orth.exer.us/1091   Copyright  VHI. All rights reserved.    Strengthening: Hip Abductor - Resisted    LYING FLAT: With RED band looped around both legs above knees, push thighs apart. Hold for 3-5 seconds. Repeat _10_ times per set. Do _2_ sets per session. Do _3_ sessions per week.  http://orth.exer.us/688   Copyright  VHI. All rights reserved.   Perform these while seated in wheelchair or other surface with feet on floor:     Sitting Chair Flexion    Bring knee up toward chest. NO WEIGHT. Repeat with other leg, alternating legs. Repeat _10_ times each leg. Do __1__ sessions per day.  http://gt2.exer.us/391   Copyright  VHI. All rights reserved.    Knee Extension: Resisted (Sitting)    With yellow band looped around both ankles, straighten one leg out. Keep other leg bent to increase resistance. Repeat _10_ times each leg. Do _2_ sets per session. Do _3_ sessions per week.  http://orth.exer.us/691   Copyright  VHI. All rights reserved.   Knee Flexion: Resisted (Sitting)    STOP THIS ONE AND PERFORM EXERCISE BELOW: Sit with band around both ankles, prop one foot on a foot stool.  Pull unsupported leg back, bending knee. Slowly let that leg straighten back out. Repeat _10_ times each  leg.  Do _1-2_ sessions per day.  http://orth.exer.us/695   Copyright  VHI. All rights reserved.    Bracing With Heel  Slides (Supine)    With neutral spine, tighten pelvic floor and abdominals and hold. Alternating legs, slide heel to bottom. Perform with other leg. Repeat _10__ times. Do _2__ times a day.   Copyright  VHI. All rights reserved.

## 2016-11-26 NOTE — Therapy (Signed)
[facility] [address] [place], [place], [address] Phone: [phone]   Fax:  [phone]  Physical Therapy Treatment  Patient Details  Name: Katrina Donaldson MRN: [id number] Date of Birth: June 13, 1956 Referring Provider: Dr. Posey Pronto  Encounter Date: 11/26/2016      PT End of Session - 11/26/16 1255    Visit Number 6   Number of Visits 17   Date for PT Re-Evaluation 12/31/16   Authorization Type BCBS State, Medicare? G-CODE AND PROGRESS NOTE EVERY 10TH VISIT.    PT Start Time 1101   PT Stop Time 1147   PT Time Calculation (min) 46 min   Equipment Utilized During Treatment --  min guard to min A during txfs and min guard to S during activities to ensure safety   Activity Tolerance Patient tolerated treatment well   Behavior During Therapy [facility] for tasks assessed/performed      Past Medical History:  Diagnosis Date  . Brain aneurysm   . Depression   . History of DVT (deep vein thrombosis)   . Hypercoagulable state ([place])   . Hypertension   . Hypothyroidism   . Stroke [facility]) 2016   [facility] 11/29/14    Past Surgical History:  Procedure Laterality Date  . ABDOMINAL HYSTERECTOMY     patient denies  . BRAIN SURGERY     aneurysm  . goiter    . LAMINECTOMY N/A 07/19/2016   Procedure: LAMINECTOMY THORACIC FOUR THORACIC FIVE, THORACIC SIX TO THORACIC EIGHT,THORACIC TEN TO LUMBAR ONE, FENESTRATION OF ARACHNOID CYSTS;  Surgeon: Consuella Lose, MD;  Location: [facility];  Service: Neurosurgery;  Laterality: N/A;  . THYROIDECTOMY      There were no vitals filed for this visit.      Subjective Assessment - 11/26/16 1104    Subjective Pt denied falls or changes since last visit.    Pertinent History SAH, aneurym, HTN, DVT R iliac, hypothyroidism, R hydrocephalus, hypercoagulable state   Patient Stated Goals "I want to be walking again without support, I want to be back to where I was."   Currently in Pain? No/denies        Therex: Pt performed exercises in seated and supine positions, with S for safety. Cues and demo for technique during heel slides. Please see pt instructions for HEP details. No c/o pain during or after exercises.                  [facility] Adult PT Treatment/Exercise - 11/26/16 1107      Transfers   Transfers Sit to Stand;Stand to [employer] Transfers   Sit to Stand 4: Min assist;4: Min guard;With upper extremity assist   Sit to Stand Details Tactile cues for sequencing;Tactile cues for placement;Verbal cues for technique;Verbal cues for sequencing   Sit to Stand Details (indicate cue type and reason) Pt performed with RW, PT provided assist proximal to knees to prevent knees from buckling.   Stand to Sit 4: Min guard;With upper extremity assist;To chair/3-in-1   Stand to Sit Details (indicate cue type and reason) Verbal cues for sequencing;Verbal cues for technique;Verbal cues for precautions/safety;Verbal cues for safe use of DME/AE;Manual facilitation for placement;Manual facilitation for weight shifting;Tactile cues for weight shifting   Stand to Sit Details Cues to improve eccentric control, pt used RW   Stand Pivot Transfers 4: Min assist   Stand Pivot Transfer Details (indicate cue type and reason) with RW, pt able to sidestep from w/c to mat with min A and  min guard to ensure safety.             Balance Exercises - 11/26/16 1300      Balance Exercises: Standing   Standing Eyes Opened Wide (BOA);Solid surface;3 reps;30 secs  UE support on RW   Other Standing Exercises Pt performed ant/post/lat weight shifting with BUE support on RW and min guard.  Pt demonstrated proper technique.           PT Education - 11/26/16 1254    Education provided Yes   Education Details PT educated pt on progression and modification of strengthening HEP. PT encouraged pt to continue balance HEP at kitchen sink. PT discussed goal progress, and requested pt add more appt's  to schedule for 2x/week for 4 weeks.   Person(s) Educated Patient   Methods Explanation   Comprehension Verbalized understanding          PT Short Term Goals - 11/26/16 1300      PT SHORT TERM GOAL #1   Title Pt will be IND with HEP to improve strength, endurance, balance, and flexibility. TARGET DATE FOR ALL STGs: 11/29/16   Status Achieved     PT SHORT TERM GOAL #2   Title Pt will perform STS txfs at MOD I level with RW to improve functional mobility.    Status Partially Met     PT SHORT TERM GOAL #3   Title Pt will amb. 100' with RW, at MOD I level, over even terrain to safely amb. at home.    Status Not Met     PT SHORT TERM GOAL #4   Title Assess BERG and TUG and write goal once appropriate   Status Deferred           PT Long Term Goals - 11/26/16 1302      PT LONG TERM GOAL #1   Title Pt will amb. 300' over even and paved surfaces, LRAD, at MOD I level, in order to amb. to/from MD appointments. TARGET DATE FOR ALL LTGS: 12/26/16   Status New     PT LONG TERM GOAL #2   Title Pt will perform STS txfs at MOD I level to improve safety during functional mobility.   Status New     PT LONG TERM GOAL #3   Title Pt will perform stand pivot txfs to/from chair to bed with RW at MOD I level to improve functional mobility.    Status New     PT LONG TERM GOAL #4   Title Pt will verbalize fall prevention strategies to reduce falls risk.    Status New               Plan - 11/26/16 1256    Clinical Impression Statement Pt met LTG 1, partially met LTG 2, did not meet LTG 3 and LTG 4 deferred as pt unable to tolerate standing for prolonged periods (without assist) of time. Pt demonstrated progress, as she was able to perform standing txf from w/c to mat with RW, while taking approx. 3-4 steps. Pt demonstrated improve LE strength, as she was able to perform txfs without knees buckling and tolerated progression of LE strengthening HEP. Pt continues to require rest breaks 2/2  fatigue and assist during STS txfs 2/2 LE weakness. Pt continues to require cues to improve upright posture and to reduce weight through UEs on RW during balance and txf activities. Pt would continue to benefit from skilled PT to improve safety during functional mobility.  Rehab Potential Good   Clinical Impairments Affecting Rehab Potential extensive PMH   PT Treatment/Interventions ADLs/Self Care Home Management;Biofeedback;Canalith Repostioning;Electrical Stimulation;Neuromuscular re-education;Balance training;Therapeutic exercise;Manual techniques;Therapeutic activities;Functional mobility training;Gait training;DME Instruction;Orthotic Fit/Training;Patient/family education;Vestibular;Stair training   PT Next Visit Plan stretching to promote shoulder extension, depression and ER, stretch hip flexors, promote hip extension, hamstring activation; standing with improved upright posture to take weight off of UE, gait with platform RW with harness, weight shifting and WB through LE   PT Home Exercise Plan Balance and strengthening HEP   Consulted and Agree with Plan of Care Patient      Patient will benefit from skilled therapeutic intervention in order to improve the following deficits and impairments:  Abnormal gait, Decreased endurance, Impaired sensation, Decreased strength, Decreased knowledge of use of DME, Decreased balance, Decreased mobility, Decreased range of motion, Decreased coordination, Impaired flexibility, Postural dysfunction  Visit Diagnosis: Muscle weakness (generalized)  Other abnormalities of gait and mobility  Unsteadiness on feet     Problem List Patient Active Problem List   Diagnosis Date Noted  . Urinary retention   . Acute pain of right knee   . Acute lower UTI   . Weakness of right lower extremity   . Confusion   . Hydrocephalus   . Poor appetite   . Acute blood loss anemia   . Post-operative pain   . Hypokalemia   . Leukocytosis   . Thrombocytopenia  ([place])   . Acute deep vein thrombosis (DVT) of femoral vein of left lower extremity ([place])   . Myelopathy ([place]) 07/24/2016  . Thoracic myelopathy   . Depression   . Benign essential HTN   . History of subarachnoid hemorrhage   . Hypercoagulable state ([place])   . Constipation due to pain medication   . 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 Protein-calorie malnutrition ([place]) 12/21/2014  . Thyroid activity decreased 12/21/2014  . 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 Essential hypertension 12/13/2014  . Anemia, iron deficiency 12/13/2014  . Hypothyroidism 12/13/2014  . Prediabetes 12/13/2014    Eudora Guevarra L 11/26/2016, 1:03 PM  [facility] [address] [place], [place], [address] Phone: [phone]   Fax:  [phone]  Name: Kayda Allers MRN: [id number] Date of Birth: 02-06-1956  Geoffry Paradise, PT,DPT 11/26/16 1:05 PM Phone: [phone] Fax: [phone]

## 2016-11-28 ENCOUNTER — Encounter
Payer: BC Managed Care – PPO | Attending: Physical Medicine & Rehabilitation | Admitting: Physical Medicine & Rehabilitation

## 2016-11-28 ENCOUNTER — Encounter: Payer: Self-pay | Admitting: Physical Medicine & Rehabilitation

## 2016-11-28 VITALS — BP 124/82 | HR 58

## 2016-11-28 DIAGNOSIS — R609 Edema, unspecified: Secondary | ICD-10-CM

## 2016-11-28 DIAGNOSIS — D6859 Other primary thrombophilia: Secondary | ICD-10-CM

## 2016-11-28 DIAGNOSIS — Z8679 Personal history of other diseases of the circulatory system: Secondary | ICD-10-CM | POA: Diagnosis not present

## 2016-11-28 DIAGNOSIS — G8220 Paraplegia, unspecified: Secondary | ICD-10-CM | POA: Insufficient documentation

## 2016-11-28 DIAGNOSIS — G9589 Other specified diseases of spinal cord: Secondary | ICD-10-CM | POA: Diagnosis present

## 2016-11-28 DIAGNOSIS — Z86718 Personal history of other venous thrombosis and embolism: Secondary | ICD-10-CM | POA: Diagnosis not present

## 2016-11-28 DIAGNOSIS — R600 Localized edema: Secondary | ICD-10-CM

## 2016-11-28 DIAGNOSIS — F32A Depression, unspecified: Secondary | ICD-10-CM

## 2016-11-28 DIAGNOSIS — I10 Essential (primary) hypertension: Secondary | ICD-10-CM | POA: Diagnosis not present

## 2016-11-28 DIAGNOSIS — Z8673 Personal history of transient ischemic attack (TIA), and cerebral infarction without residual deficits: Secondary | ICD-10-CM | POA: Insufficient documentation

## 2016-11-28 DIAGNOSIS — M4714 Other spondylosis with myelopathy, thoracic region: Secondary | ICD-10-CM

## 2016-11-28 DIAGNOSIS — E039 Hypothyroidism, unspecified: Secondary | ICD-10-CM | POA: Insufficient documentation

## 2016-11-28 DIAGNOSIS — Z7901 Long term (current) use of anticoagulants: Secondary | ICD-10-CM | POA: Diagnosis not present

## 2016-11-28 DIAGNOSIS — I82412 Acute embolism and thrombosis of left femoral vein: Secondary | ICD-10-CM

## 2016-11-28 DIAGNOSIS — Z79899 Other long term (current) drug therapy: Secondary | ICD-10-CM | POA: Diagnosis not present

## 2016-11-28 DIAGNOSIS — F329 Major depressive disorder, single episode, unspecified: Secondary | ICD-10-CM | POA: Insufficient documentation

## 2016-11-28 DIAGNOSIS — G919 Hydrocephalus, unspecified: Secondary | ICD-10-CM | POA: Insufficient documentation

## 2016-11-28 NOTE — Patient Instructions (Signed)
Please schedule appointment and follow up with Dr. Kathyrn Sheriff  Address: [address] [place], [place], [place] [address]  Phone: [phone]

## 2016-11-28 NOTE — Progress Notes (Signed)
Subjective:    Patient ID: Katrina Donaldson, female    DOB: [birth year], 61 y.o.   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  Pt is relatively poor historian.  Last clinic visit 10/17/16.  She saw Heme/Onc, and believes she may be taken off anticoag soon.  She still has not seen Neurosurg.  She is now going to therapies after getting her SCAT paperwork taken care of.    Pain Inventory Average Pain 1 Pain Right Now 1 My pain is intermittent  In the last 24 hours, has pain interfered with the following? General activity 1 Relation with others 1 Enjoyment of life 0 What TIME of day is your pain at its worst? night Sleep (in general) NA  Pain is worse with: unsure and rolling wheel chair causes wrist pain Pain improves with: rest Relief from Meds: n/a  Mobility ability to climb steps?  no do you drive?  no use a wheelchair needs help with transfers  Function retired I need assistance with the following:  meal prep, household duties and shopping  Neuro/Psych weakness numbness tingling trouble walking spasms  Prior Studies Any changes since last visit?  no  Physicians involved in your care Any changes since last visit?  no Primary care Dr Nancy Fetter   No family history on myelopathy. Social History   Social History  . Marital status: Widowed    Spouse name: N/A  . Number of children: N/A  . Years of education: N/A   Social History Main Topics  . Smoking status: Never Smoker  . Smokeless tobacco: Never Used  . Alcohol use No  . Drug use: No  . Sexual activity: Not Asked   Other Topics Concern  . None   Social History Narrative  . None   Past Surgical History:  Procedure Laterality Date  . ABDOMINAL HYSTERECTOMY     patient denies  . BRAIN SURGERY     aneurysm  . goiter      . LAMINECTOMY N/A 07/19/2016   Procedure: LAMINECTOMY THORACIC FOUR THORACIC FIVE, THORACIC SIX TO THORACIC EIGHT,THORACIC TEN TO LUMBAR ONE, FENESTRATION OF ARACHNOID CYSTS;  Surgeon: Consuella Lose, MD;  Location: [facility];  Service: Neurosurgery;  Laterality: N/A;  . THYROIDECTOMY     Past Medical History:  Diagnosis Date  . Brain aneurysm   . Depression   . History of DVT (deep vein thrombosis)   . Hypercoagulable state ([place])   . Hypertension   . Hypothyroidism   . Stroke ([place]) 2016   [facility] 11/29/14   BP 124/82   Pulse (!) 58   SpO2 95%   Opioid Risk Score:   Fall Risk Score:  `1  Depression screen PHQ 2/9  Depression screen [facility] 2/9 10/22/2016 09/05/2016  Decreased Interest 0 0  Down, Depressed, Hopeless 1 0  PHQ - 2 Score 1 0  Altered sleeping - 0  Tired, decreased energy - 0  Change in appetite - 0  Feeling bad or failure about yourself  - 0  Trouble concentrating - 0  Moving slowly or fidgety/restless - 0  Suicidal thoughts - 0  PHQ-9 Score - 0   Review of Systems  Constitutional: Positive for chills.  HENT: Negative.   Eyes: Negative.   Respiratory: Negative.   Cardiovascular: Positive for leg swelling.  Gastrointestinal: Negative.   Endocrine: Negative.   Genitourinary: Negative.  Musculoskeletal: Positive for arthralgias and gait problem.       Spasms  Skin: Positive for rash.  Allergic/Immunologic: Negative.   Neurological: Positive for numbness.       Tingling  Hematological: Negative.   Psychiatric/Behavioral: Negative.   All other systems reviewed and are negative.     Objective:   Physical Exam  Constitutional: She appears well-developed. NAD. Obese HENT: Normocephalic and atraumatic.  Eyes: EOM are normal. No discharge. Cardiovascular: RRR. No JVD. Respiratory: Clear bilaterally. Unlabored.  GI: Soft. Bowel sounds are normal.   Musculoskeletal: She exhibits no tenderness. +RLE edema.  Neurological: She is alert and oriented x3. Motor:  LUE: 5/5 proximal to distal LLE: 4+/5 HF,KE, 5/5 ADF/PF RUE: 5/5 proximal to distal RLE: HF 4/5, KE 4+/5, ADF/PF 5/5.  Skin: Skin is warm and dry.  Back incision intact Psychiatric: She has a normal mood and affect.     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  1. 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   No brace required.   Follow up Neurosurg, pt still has not made appointment  Cont follow up Heme/Onc  Comp HH PT, cont outpt therapy  Pt is currently staying in an apartment independently  2. Right hydrocephalus  After coil embolization of LVA aneurysm suggesting hypdocephalus  Repeat scan in 3 months per Neurosurg (due soon, pt still has not made appointment)  3. DVT  Vascular study showing extensive DVT, including right iliac, but IVC appears to be clean             Continue Xarelto   Encouraged elevation RLE  Recs per Heme/Onc  4. Mood  Cont Paxil per PCP  5. Hypercoaguable state  Pt needs to make appointment with Heme/Onc  6. LE edema  Cont recs per PCP

## 2016-11-29 ENCOUNTER — Ambulatory Visit: Payer: BC Managed Care – PPO

## 2016-12-10 ENCOUNTER — Encounter: Payer: Self-pay | Admitting: Oncology

## 2016-12-10 ENCOUNTER — Ambulatory Visit (INDEPENDENT_AMBULATORY_CARE_PROVIDER_SITE_OTHER): Payer: BC Managed Care – PPO | Admitting: Oncology

## 2016-12-10 VITALS — BP 137/75 | HR 56 | Temp 97.9°F | Wt 220.5 lb

## 2016-12-10 DIAGNOSIS — I82412 Acute embolism and thrombosis of left femoral vein: Secondary | ICD-10-CM

## 2016-12-10 DIAGNOSIS — I825Y1 Chronic embolism and thrombosis of unspecified deep veins of right proximal lower extremity: Secondary | ICD-10-CM

## 2016-12-10 DIAGNOSIS — Z91013 Allergy to seafood: Secondary | ICD-10-CM | POA: Diagnosis not present

## 2016-12-10 DIAGNOSIS — Z7901 Long term (current) use of anticoagulants: Secondary | ICD-10-CM

## 2016-12-10 DIAGNOSIS — Z8679 Personal history of other diseases of the circulatory system: Secondary | ICD-10-CM

## 2016-12-10 DIAGNOSIS — D508 Other iron deficiency anemias: Secondary | ICD-10-CM

## 2016-12-10 NOTE — Patient Instructions (Signed)
Lab & visit in 8-10 weeks Stay on blood thinner current dose for now

## 2016-12-10 NOTE — Addendum Note (Signed)
Addended by: Truddie Crumble on: 12/10/2016 04:10 PM   Modules accepted: Orders

## 2016-12-10 NOTE — Progress Notes (Signed)
Hematology and Oncology Follow Up Visit  Katrina Donaldson [id number] January 18, 1956 61 y.o. 12/10/2016 3:41 PM   Principle Diagnosis: Encounter Diagnoses  Name Primary?  . Acute deep vein thrombosis (DVT) of femoral vein of left lower extremity ([employer]) Yes  . History of subarachnoid hemorrhage   . Other iron deficiency anemia   Clinical summary: 61 year old woman I evaluated back in April 2018 for advice on anticoagulation. She had what I felt was a provoked right lower extremity DVT in 2005 following a long car drive. 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 Anticoagulation was resumed with Xarelto. She still had significant handicaps and was not ambulatory at the time of her visit with me. I recommended continuing full dose anticoagulation. Laboratory studies showed a recovering iron deficiency anemia. Anticardiolipin antibodies and antibodies to beta 2 glycoprotein 1 were not detected. I did not see any value in any additional genetic testing given her age and the provoked nature of her clots. A d-dimer was still elevated at 1.37 on 10/22/2016 but some of this could reflect inflammatory changes from her recent spinal surgery.   Interim History:   Her recovery has been slow. She is doing some limited physical therapy. She can walk very short distance with the use of a walker but has been advised against doing this if she has no one at home to help her if she falls. She is still spending most of the day in the bed or a chair. She is not noted any leg swelling or pain. She has noted increasing edema. She is gaining weight. She denied any dyspnea, chest pain, or palpitations. She has had no bleeding problems on the Xarelto and specifically denies epistaxis, gum bleeding,  hematochezia, melena hematuria, or vaginal bleeding.  Medications: reviewed  Allergies:  Allergies  Allergen Reactions  . Other Other (See Comments)    Allergic to shellfish such as shrimp     Review of Systems: See interim history Remaining ROS negative:   Physical Exam: Blood pressure 137/75, pulse (!) 56, temperature 97.9 F (36.6 C), temperature source Oral, weight 220 lb 8 oz (100 kg), SpO2 97 %. Wt Readings from Last 3 Encounters:  12/10/16 220 lb 8 oz (100 kg)  10/22/16 212 lb 4.8 oz (96.3 kg)  07/24/16 189 lb 9.5 oz (86 kg)     General appearance: Well-nourished African-American woman HENNT: Mild proptosis. Pharynx no erythema, exudate, mass, or ulcer. No thyromegaly or thyroid nodules Lymph nodes: No cervical, supraclavicular, or axillary lymphadenopathy Breasts:  Lungs: Clear to auscultation, resonant to percussion throughout Heart: Regular rhythm, no murmur, no gallop, no rub, no click, no edema Abdomen: Soft, nontender, normal bowel sounds, no mass, no organomegaly Extremities: No edema, no calf tenderness Musculoskeletal: no joint deformities GU:  Vascular: Carotid pulses 2+, no bruits, Neurologic: Alert, oriented, PERRLA,  cranial nerves grossly normal, motor strength: Proximal weakness both lower extremities left greater than right 4/5 with good 5 over 5 distal strength. Reflexes absent symmetric at the knees 1+ symmetric at the biceps., upper body coordination normal, gait not tested Skin: No rash or ecchymosis  Lab Results: CBC W/Diff    Component Value Date/Time   WBC 5.7 10/22/2016 1459   WBC 3.9 (L) 08/16/2016 0516   RBC 4.11 10/22/2016 1459   RBC 3.77 (L) 08/16/2016 2353  HGB 11.9 10/22/2016 1459   HCT 36.0 10/22/2016 1459   PLT 257 10/22/2016 1459   MCV 88 10/22/2016 1459   MCH 29.0 10/22/2016 1459   MCH 27.3 08/16/2016 0516   MCHC 33.1 10/22/2016 1459   MCHC 31.3 08/16/2016 0516   RDW 18.1 (H) 10/22/2016 1459   LYMPHSABS 2.0 10/22/2016  1459   MONOABS 0.4 08/16/2016 0516   EOSABS 0.1 10/22/2016 1459   BASOSABS 0.0 10/22/2016 1459     Chemistry      Component Value Date/Time   NA 139 08/16/2016 0516   NA 133 (A) 12/14/2014   K 4.4 08/16/2016 0516   CL 106 08/16/2016 0516   CO2 26 08/16/2016 0516   BUN 10 08/16/2016 0516   BUN 8 12/14/2014   CREATININE 0.89 08/16/2016 0516   GLU 94 12/14/2014      Component Value Date/Time   CALCIUM 8.4 (L) 08/16/2016 0516   ALKPHOS 87 07/25/2016 0738   AST 22 07/25/2016 0738   ALT 16 07/25/2016 0738   BILITOT 0.7 07/25/2016 0738       Radiological Studies: No results found.  Impression:  #1. Recurrent, provoked, proximal, right lower extremity, DVT.  She has now been on anticoagulation for about 4 months. However, due to her limited mobility, I am recommending continuing full dose anticoagulation at this time. I will reevaluate her again in 8-10 weeks. Repeat d-dimer at that time. Assess her mobility and make further decisions on whether she should continue full dose, reduced dose, or discontinue anticoagulation at that time.  #2. Iron deficiency anemia secondary to surgical blood loss significant improvement on oral iron replacement. Check CBC at time of next visit.  CC: Patient Care Team: Donald Prose, MD as PCP - General (Family Medicine)   Murriel Hopper, MD, [facility]  Hematology-Oncology/Internal Medicine     6/12/20183:41 PM

## 2016-12-11 ENCOUNTER — Ambulatory Visit: Payer: BC Managed Care – PPO | Attending: Physical Medicine & Rehabilitation

## 2016-12-11 DIAGNOSIS — M4714 Other spondylosis with myelopathy, thoracic region: Secondary | ICD-10-CM | POA: Insufficient documentation

## 2016-12-11 DIAGNOSIS — R208 Other disturbances of skin sensation: Secondary | ICD-10-CM | POA: Diagnosis present

## 2016-12-11 DIAGNOSIS — R2689 Other abnormalities of gait and mobility: Secondary | ICD-10-CM | POA: Insufficient documentation

## 2016-12-11 DIAGNOSIS — R2681 Unsteadiness on feet: Secondary | ICD-10-CM | POA: Insufficient documentation

## 2016-12-11 DIAGNOSIS — M6281 Muscle weakness (generalized): Secondary | ICD-10-CM | POA: Diagnosis present

## 2016-12-11 NOTE — Patient Instructions (Signed)
Quads / HF, Supine    Lie near edge of bed, one leg bent, foot flat on bed. Other leg hanging over edge, relaxed, thigh resting entirely on bed. Progress to: Bend hanging knee backward keeping thigh in contact with bed. Hold __2-3_ minutes.  Switch and perform with other leg. Place foot on stool as needed for support. Repeat __1_ times per session. Do _2__ sessions per day.  Copyright  VHI. All rights reserved.   Scapular retraction    Tuck elbows at your side, seated upright position. Squeeze shoulder blades together, down and in. 3 x 10 reps. Perform 3 days a week.   Cervical retraction   Perform in seated position. Tuck chin and move head back (imagine pushing your head into my hand). Perform x10 reps every day.

## 2016-12-11 NOTE — Therapy (Signed)
[facility] [address] [place], [place], [address] Phone: [phone]   Fax:  [phone]  Physical Therapy Treatment  Patient Details  Name: Katrina Donaldson MRN: [id number] Date of Birth: [birth year] Referring Provider: Dr. Posey Pronto  Encounter Date: 12/11/2016      PT End of Session - 12/11/16 1440    Visit Number 7   Number of Visits 17   Date for PT Re-Evaluation 12/31/16   Authorization Type BCBS State, Medicare? G-CODE AND PROGRESS NOTE EVERY 10TH VISIT.    PT Start Time 1022   PT Stop Time 1100   PT Time Calculation (min) 38 min   Equipment Utilized During Treatment Gait belt   Activity Tolerance Patient tolerated treatment well   Behavior During Therapy WFL for tasks assessed/performed      Past Medical History:  Diagnosis Date  . Brain aneurysm   . Depression   . History of DVT (deep vein thrombosis)   . Hypercoagulable state ([place])   . Hypertension   . Hypothyroidism   . Stroke [facility]) 2016   [facility] 11/29/14    Past Surgical History:  Procedure Laterality Date  . ABDOMINAL HYSTERECTOMY     patient denies  . BRAIN SURGERY     aneurysm  . goiter    . LAMINECTOMY N/A 07/19/2016   Procedure: LAMINECTOMY THORACIC FOUR THORACIC FIVE, THORACIC SIX TO THORACIC EIGHT,THORACIC TEN TO LUMBAR ONE, FENESTRATION OF ARACHNOID CYSTS;  Surgeon: Consuella Lose, MD;  Location: [facility];  Service: Neurosurgery;  Laterality: N/A;  . THYROIDECTOMY      There were no vitals filed for this visit.      Subjective Assessment - 12/11/16 1025    Subjective Pt denied falls or changes since last visit. Pt saw MD regarding RLE DVT and was told it can take awhile for clot to dissolve. Pt reported L shoulder stiffness but no pain.    Pertinent History SAH, aneurym, HTN, DVT R iliac, hypothyroidism, R hydrocephalus, hypercoagulable state   Patient Stated Goals "I want to be walking again without support, I want to be back to where  I was."   Currently in Pain? No/denies       Therex: Pt performed stretches in supine and strengthening (posture) exercises in seated position. Please see pt instructions for HEP details. No c/o pain during session.                   [facility] Adult PT Treatment/Exercise - 12/11/16 1436      Bed Mobility   Bed Mobility Right Sidelying to Sit;Left Sidelying to Sit;Sit to Sidelying Right;Sit to Sidelying Left   Right Sidelying to Sit 4: Min guard;4: Min assist   Right Sidelying to Sit Details (indicate cue type and reason) Min A to guide one LE off EOB. Cues for hand placement   Left Sidelying to Sit 4: Min guard;4: Min assist   Left Sidelying to Sit Details (indicate cue type and reason) Min A to guide LE off EOB. Cues for placement/sequencing.   Sit to Sidelying Right 4: Min guard;4: Min assist;HOB flat   Sit to Sidelying Right Details (indicate cue type and reason) Min A to guide LE off EOB and cues for sequencing.   Sit to Sidelying Left 4: Min guard;4: Min assist;HOB flat   Sit to Sidelying Left Details (indicate cue type and reason) Min A to guide LE off EOB, cues for sequencing.     Transfers   Transfers Sit to Stand;Stand  to Sit;Stand Pivot Transfers   Sit to Stand 4: Min guard;4: Min assist;With upper extremity assist;From chair/3-in-1   Sit to Stand Details Tactile cues for sequencing;Tactile cues for placement;Verbal cues for technique;Verbal cues for sequencing   Sit to Stand Details (indicate cue type and reason) Cues for sequencing and hand placement, min A to initiate STS txf.    Stand to Sit 4: Min guard;With upper extremity assist;To chair/3-in-1   Stand to Sit Details (indicate cue type and reason) Verbal cues for sequencing;Verbal cues for technique;Verbal cues for precautions/safety;Verbal cues for safe use of DME/AE;Manual facilitation for placement;Manual facilitation for weight shifting;Tactile cues for weight shifting   Stand to Sit Details Cues to control  descent and hand placement.                PT Education - 12/11/16 1440    Education provided Yes   Education Details PT provided pt with new HEP to improve flexibility and posture.   Person(s) Educated Patient   Methods Explanation;Demonstration;Tactile cues;Verbal cues;Handout   Comprehension Returned demonstration;Verbalized understanding          PT Short Term Goals - 11/26/16 1300      PT SHORT TERM GOAL #1   Title Pt will be IND with HEP to improve strength, endurance, balance, and flexibility. TARGET DATE FOR ALL STGs: 11/29/16   Status Achieved     PT SHORT TERM GOAL #2   Title Pt will perform STS txfs at MOD I level with RW to improve functional mobility.    Status Partially Met     PT SHORT TERM GOAL #3   Title Pt will amb. 100' with RW, at MOD I level, over even terrain to safely amb. at home.    Status Not Met     PT SHORT TERM GOAL #4   Title Assess BERG and TUG and write goal once appropriate   Status Deferred           PT Long Term Goals - 11/26/16 1302      PT LONG TERM GOAL #1   Title Pt will amb. 300' over even and paved surfaces, LRAD, at MOD I level, in order to amb. to/from MD appointments. TARGET DATE FOR ALL LTGS: 12/26/16   Status New     PT LONG TERM GOAL #2   Title Pt will perform STS txfs at MOD I level to improve safety during functional mobility.   Status New     PT LONG TERM GOAL #3   Title Pt will perform stand pivot txfs to/from chair to bed with RW at MOD I level to improve functional mobility.    Status New     PT LONG TERM GOAL #4   Title Pt will verbalize fall prevention strategies to reduce falls risk.    Status New               Plan - 12/11/16 1440    Clinical Impression Statement Pt demonstrated progress as she did not require rest breaks during session, only incr. time during all txfs, exercises and bed mobility 2/2 weakness. Pt continues to experience B knee buckling when pt attempts upright posture  (contraction of glute max). Pt tolerated new HEP. Pt would continue to benefit from skilled PT to improve safety during functional mobility.    Rehab Potential Good   Clinical Impairments Affecting Rehab Potential extensive PMH   PT Treatment/Interventions ADLs/Self Care Home Management;Biofeedback;Canalith Repostioning;Electrical Stimulation;Neuromuscular re-education;Balance training;Therapeutic exercise;Manual techniques;Therapeutic activities;Functional mobility training;Gait  training;DME Instruction;Orthotic Fit/Training;Patient/family education;Vestibular;Stair training   PT Next Visit Plan stretching to promote shoulder extension, depression and ER, stretch hip flexors, promote hip extension, hamstring activation; standing with improved upright posture to take weight off of UE, gait with platform RW with harness, weight shifting and WB through LE   PT Home Exercise Plan Balance and strengthening HEP   Consulted and Agree with Plan of Care Patient      Patient will benefit from skilled therapeutic intervention in order to improve the following deficits and impairments:  Abnormal gait, Decreased endurance, Impaired sensation, Decreased strength, Decreased knowledge of use of DME, Decreased balance, Decreased mobility, Decreased range of motion, Decreased coordination, Impaired flexibility, Postural dysfunction  Visit Diagnosis: Other abnormalities of gait and mobility  Muscle weakness (generalized)     Problem List Patient Active Problem List   Diagnosis Date Noted  . Urinary retention   . Acute pain of right knee   . Acute lower UTI   . Weakness of right lower extremity   . Confusion   . Hydrocephalus   . Poor appetite   . Acute blood loss anemia   . Post-operative pain   . Hypokalemia   . Leukocytosis   . Thrombocytopenia ([place])   . Acute deep vein thrombosis (DVT) of femoral vein of left lower extremity ([place])   . Myelopathy ([place]) 07/24/2016  . Thoracic myelopathy   .  Depression   . Benign essential HTN   . History of subarachnoid hemorrhage   . Hypercoagulable state ([place])   . Constipation due to pain medication   . 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 Protein-calorie malnutrition ([place]) 12/21/2014  . Thyroid activity decreased 12/21/2014  . 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 Essential hypertension 12/13/2014  . Anemia, iron deficiency 12/13/2014  . Hypothyroidism 12/13/2014  . Prediabetes 12/13/2014    Miller,Jennifer L 12/11/2016, 2:42 PM  [facility] [address] [place], [place], [address] Phone: [phone]   Fax:  [phone]  Name: Katrina Donaldson MRN: [id number] Date of Birth: 05/18/1956  Geoffry Paradise, PT,DPT 12/11/16 2:43 PM Phone: [phone] Fax: [phone]

## 2016-12-13 ENCOUNTER — Ambulatory Visit: Payer: BC Managed Care – PPO

## 2016-12-13 ENCOUNTER — Telehealth: Payer: Self-pay

## 2016-12-13 DIAGNOSIS — M6281 Muscle weakness (generalized): Secondary | ICD-10-CM

## 2016-12-13 DIAGNOSIS — R2689 Other abnormalities of gait and mobility: Secondary | ICD-10-CM | POA: Diagnosis not present

## 2016-12-13 DIAGNOSIS — M4714 Other spondylosis with myelopathy, thoracic region: Secondary | ICD-10-CM

## 2016-12-13 DIAGNOSIS — R2681 Unsteadiness on feet: Secondary | ICD-10-CM

## 2016-12-13 NOTE — Telephone Encounter (Signed)
Dr. Posey Pronto  I have been seeing Ms. Katrina Donaldson for PT. I feel that she would benefit from an OT evaluation, to address L UE impairments and pain, as this limits her ability to perform txfs, ADLs, and causes postural dysfunction. If you agree, please send [place] an order for an OT eval.  Thank you, Geoffry Paradise, PT,DPT 12/13/16 12:52 PM Phone: [phone] Fax: [phone]

## 2016-12-13 NOTE — Therapy (Signed)
[facility] [address] [place], [place], [address] Phone: [phone]   Fax:  [phone]  Physical Therapy Treatment  Patient Details  Name: Katrina Donaldson MRN: [id number] Date of Birth: Jul 08, 1955 Referring Provider: Dr. Posey Pronto  Encounter Date: 12/13/2016      PT End of Session - 12/13/16 1247    Visit Number 8   Number of Visits 17   Date for PT Re-Evaluation 12/31/16   Authorization Type BCBS State, Medicare? G-CODE AND PROGRESS NOTE EVERY 10TH VISIT.    PT Start Time 1152  pt late 2/2 transportation   PT Stop Time 1230   PT Time Calculation (min) 38 min   Equipment Utilized During Treatment Gait belt   Activity Tolerance Patient tolerated treatment well   Behavior During Therapy WFL for tasks assessed/performed      Past Medical History:  Diagnosis Date  . Brain aneurysm   . Depression   . History of DVT (deep vein thrombosis)   . Hypercoagulable state ([place])   . Hypertension   . Hypothyroidism   . Stroke [facility]) 2016   [facility] 11/29/14    Past Surgical History:  Procedure Laterality Date  . ABDOMINAL HYSTERECTOMY     patient denies  . BRAIN SURGERY     aneurysm  . goiter    . LAMINECTOMY N/A 07/19/2016   Procedure: LAMINECTOMY THORACIC FOUR THORACIC FIVE, THORACIC SIX TO THORACIC EIGHT,THORACIC TEN TO LUMBAR ONE, FENESTRATION OF ARACHNOID CYSTS;  Surgeon: Consuella Lose, MD;  Location: [facility];  Service: Neurosurgery;  Laterality: N/A;  . THYROIDECTOMY      There were no vitals filed for this visit.      Subjective Assessment - 12/13/16 1155    Subjective Pt denied falls or changes since last visit.    Pertinent History SAH, aneurym, HTN, DVT R iliac, hypothyroidism, R hydrocephalus, hypercoagulable state   Patient Stated Goals "I want to be walking again without support, I want to be back to where I was."   Currently in Pain? No/denies                         [facility] Adult PT  Treatment/Exercise - 12/13/16 1206      Transfers   Transfers Sit to Stand;Stand to Sit;Stand Pivot Transfers   Sit to Stand 4: Min guard;4: Min assist;With upper extremity assist;From chair/3-in-1   Sit to Stand Details Tactile cues for placement;Verbal cues for technique;Verbal cues for sequencing;Tactile cues for sequencing   Sit to Stand Details (indicate cue type and reason) Pt performed x 5 reps and then x3 reps. Cues to use ant. weight shifting (rocking momentum) to improve STS txf. Pt require min A during last 2 reps at end of session 2/2 fatigue and L shoulder pain.    Stand to Sit 4: Min guard;With upper extremity assist;To chair/3-in-1   Stand to Sit Details (indicate cue type and reason) Verbal cues for sequencing;Verbal cues for technique;Verbal cues for precautions/safety;Verbal cues for safe use of DME/AE;Manual facilitation for placement;Manual facilitation for weight shifting;Tactile cues for weight shifting   Stand to Sit Details Cues to improve eccentric control. x8 reps total.   Stand Pivot Transfers 4: Min guard   Stand Pivot Transfer Details (indicate cue type and reason) Performed with RW (w/c<>mat), pt demonstrated improved lateral wt. shifting during txf.              Balance Exercises - 12/13/16 1206  Balance Exercises: Standing   Marching Limitations Pt performed with RW and cues to improve upright posture and lateral wt. shifting. x10 reps/LE.      Balance Exercises: Seated   Dynamic Sitting Eyes opened;Eyes closed;Lower extremity support - 2;Upper extremity support - 1;No upper extremity support;Reaching outside base of support;Other (comment)  ball toss   Other Seated Exercises Performed in seated position, no back support with min guard to min A for safety. Pt performed reaching outside BOS with each UE x10-20 reps/UE/activity, pt reached for PT's hand, cones, and performed ball toss (x10 reps to herself and x50 reps in all directions with PT). Pt had  incr. difficulty maintaining balance during ant. reaching and with LUE 2/2 limited L shoulder range and pain. Pt was able to reach across midline to stack and unstack cones. Pt performed B posterior shoulder rolls to decr. shoulder pain during rest breaks. Pt also performed seated cx retraction x10 reps to improve FHP. Cues to decr. B shoulder shrug during reaching.            PT Education - 12/13/16 1247    Education provided Yes   Education Details PT discussed the importance of an OT referral to address LUE limitations.    Person(s) Educated Patient   Methods Explanation   Comprehension Verbalized understanding          PT Short Term Goals - 11/26/16 1300      PT SHORT TERM GOAL #1   Title Pt will be IND with HEP to improve strength, endurance, balance, and flexibility. TARGET DATE FOR ALL STGs: 11/29/16   Status Achieved     PT SHORT TERM GOAL #2   Title Pt will perform STS txfs at MOD I level with RW to improve functional mobility.    Status Partially Met     PT SHORT TERM GOAL #3   Title Pt will amb. 100' with RW, at MOD I level, over even terrain to safely amb. at home.    Status Not Met     PT SHORT TERM GOAL #4   Title Assess BERG and TUG and write goal once appropriate   Status Deferred           PT Long Term Goals - 11/26/16 1302      PT LONG TERM GOAL #1   Title Pt will amb. 300' over even and paved surfaces, LRAD, at MOD I level, in order to amb. to/from MD appointments. TARGET DATE FOR ALL LTGS: 12/26/16   Status New     PT LONG TERM GOAL #2   Title Pt will perform STS txfs at MOD I level to improve safety during functional mobility.   Status New     PT LONG TERM GOAL #3   Title Pt will perform stand pivot txfs to/from chair to bed with RW at MOD I level to improve functional mobility.    Status New     PT LONG TERM GOAL #4   Title Pt will verbalize fall prevention strategies to reduce falls risk.    Status New               Plan -  12/13/16 1247    Clinical Impression Statement Pt demonstrated progress, as she was able to perform x5 reps of STS txfs with min guard, but did require min A duirng last 2 reps of 8 total STS txfs 2/2 fatigue and L shoulder pain. Pt continues to require assist during balance activities 2/2 weakness  and decr. ability to correct balance when reaching in ant. direction. PT recommeneding OT eval to address pt's LUE impairments. Continue with POC.    Rehab Potential Good   Clinical Impairments Affecting Rehab Potential extensive PMH   PT Treatment/Interventions ADLs/Self Care Home Management;Biofeedback;Canalith Repostioning;Electrical Stimulation;Neuromuscular re-education;Balance training;Therapeutic exercise;Manual techniques;Therapeutic activities;Functional mobility training;Gait training;DME Instruction;Orthotic Fit/Training;Patient/family education;Vestibular;Stair training   PT Next Visit Plan stretching to promote shoulder extension, depression and ER, stretch hip flexors, promote hip extension, hamstring activation; standing with improved upright posture to take weight off of UE, gait with platform RW with harness, weight shifting and WB through LE   PT Home Exercise Plan Balance and strengthening HEP   Recommended Other Services OT eval   Consulted and Agree with Plan of Care Patient      Patient will benefit from skilled therapeutic intervention in order to improve the following deficits and impairments:  Abnormal gait, Decreased endurance, Impaired sensation, Decreased strength, Decreased knowledge of use of DME, Decreased balance, Decreased mobility, Decreased range of motion, Decreased coordination, Impaired flexibility, Postural dysfunction  Visit Diagnosis: Unsteadiness on feet  Other abnormalities of gait and mobility  Muscle weakness (generalized)     Problem List Patient Active Problem List   Diagnosis Date Noted  . Urinary retention   . Acute pain of right knee   . Acute  lower UTI   . Weakness of right lower extremity   . Confusion   . Hydrocephalus   . Poor appetite   . Acute blood loss anemia   . Post-operative pain   . Hypokalemia   . Leukocytosis   . Thrombocytopenia ([place])   . Acute deep vein thrombosis (DVT) of femoral vein of left lower extremity ([place])   . Myelopathy ([place]) 07/24/2016  . Thoracic myelopathy   . Depression   . Benign essential HTN   . History of subarachnoid hemorrhage   . Hypercoagulable state ([place])   . Constipation due to pain medication   . 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 Protein-calorie malnutrition ([place]) 12/21/2014  . Thyroid activity decreased 12/21/2014  . 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 Essential hypertension 12/13/2014  . Anemia, iron deficiency 12/13/2014  . Hypothyroidism 12/13/2014  . Prediabetes 12/13/2014    Linell Shawn L 12/13/2016, 12:50 PM  [facility] [address] [place], [place], [address] Phone: [phone]   Fax:  [phone]  Name: Katrina Donaldson MRN: [id number] Date of Birth: Feb 18, 1956  Geoffry Paradise, PT,DPT 12/13/16 12:50 PM Phone: [phone] Fax: [phone]

## 2016-12-17 ENCOUNTER — Ambulatory Visit: Payer: BC Managed Care – PPO

## 2016-12-17 LAB — PROTIME-INR

## 2016-12-19 ENCOUNTER — Ambulatory Visit: Payer: BC Managed Care – PPO | Admitting: Physical Therapy

## 2016-12-19 ENCOUNTER — Encounter: Payer: Self-pay | Admitting: Physical Therapy

## 2016-12-19 DIAGNOSIS — R208 Other disturbances of skin sensation: Secondary | ICD-10-CM

## 2016-12-19 DIAGNOSIS — R2689 Other abnormalities of gait and mobility: Secondary | ICD-10-CM | POA: Diagnosis not present

## 2016-12-19 DIAGNOSIS — R2681 Unsteadiness on feet: Secondary | ICD-10-CM

## 2016-12-19 NOTE — Therapy (Signed)
[facility] [address] [place], [place], [address] Phone: [phone]   Fax:  [phone]  Physical Therapy Treatment  Patient Details  Name: Katrina Donaldson MRN: [id number] Date of Birth: 1956-05-10 Referring Provider: Dr. Posey Pronto  Encounter Date: 12/19/2016      PT End of Session - 12/19/16 1720    Visit Number 9   Number of Visits 17   Date for PT Re-Evaluation 12/31/16   Authorization Type BCBS State, Medicare   PT Start Time 1532   PT Stop Time 1614   PT Time Calculation (min) 42 min   Equipment Utilized During Treatment Gait belt   Activity Tolerance Patient tolerated treatment well   Behavior During Therapy [facility] for tasks assessed/performed      Past Medical History:  Diagnosis Date  . Brain aneurysm   . Depression   . History of DVT (deep vein thrombosis)   . Hypercoagulable state ([place])   . Hypertension   . Hypothyroidism   . Stroke [facility]) 2016   [facility] 11/29/14    Past Surgical History:  Procedure Laterality Date  . ABDOMINAL HYSTERECTOMY     patient denies  . BRAIN SURGERY     aneurysm  . goiter    . LAMINECTOMY N/A 07/19/2016   Procedure: LAMINECTOMY THORACIC FOUR THORACIC FIVE, THORACIC SIX TO THORACIC EIGHT,THORACIC TEN TO LUMBAR ONE, FENESTRATION OF ARACHNOID CYSTS;  Surgeon: Consuella Lose, MD;  Location: [facility];  Service: Neurosurgery;  Laterality: N/A;  . THYROIDECTOMY      There were no vitals filed for this visit.      Subjective Assessment - 12/19/16 1534    Subjective Pt denied falls or changes since last visit.    Pertinent History SAH, aneurym, HTN, DVT R iliac, hypothyroidism, R hydrocephalus, hypercoagulable state   Patient Stated Goals "I want to be walking again without support, I want to be back to where I was."   Currently in Pain? No/denies                         [facility] Adult PT Treatment/Exercise - 12/19/16 0001      Bed Mobility   Bed Mobility  Rolling Right;Right Sidelying to Sit;Sit to Supine   Rolling Right 4: Min assist   Rolling Right Details (indicate cue type and reason) she is accustomed to pulling on arm of w/c and not present   Right Sidelying to Sit 4: Min guard   Right Sidelying to Sit Details (indicate cue type and reason) incr time, effort   Sit to Supine 4: Min assist   Sit to Supine - Details (indicate cue type and reason) assist to raise 2nd leg onto bed     Transfers   Transfers Sit to Stand;Stand to Sit;Stand Pivot Transfers;Lateral/Scoot Transfers   Sit to Stand 4: Min assist;3: Mod assist;From bed;From chair/3-in-1;With armrests;With upper extremity assist   Sit to Stand Details (indicate cue type and reason) from bed mod assist, from W/C min assist progressing to minguard;    Stand to Sit 4: Min assist   Stand to Sit Details vc for proper sequencing and to control descent   Stand Pivot Transfers 4: Min assist;1: +2 Total assist  +2 for safety   Stand Pivot Transfer Details (indicate cue type and reason) with RW   Lateral/Scoot Transfers 6: Modified independent (Device/Increase time)   Comments lateral scoot w/c to mat table (with armrest lifted out of the way)  Ambulation/Gait   Ambulation/Gait Yes   Ambulation/Gait Assistance 4: Min assist  2nd person follow with w/c for safety and maximize distance   Ambulation/Gait Assistance Details incr reliance on UEs, able to relax shoulders with max cues (for 1-2 steps), able to control Rt knee (not hyperextend) when cued ~50% of the time; in // bars with PT on stool at rt knee to assist with rt knee control (2nd person follow with w/c);    Ambulation Distance (Feet) 18 Feet  with rw, 6 x 2 with // bars, 12 RW   Assistive device Rolling walker   Gait Pattern Step-through pattern;Decreased step length - right;Decreased step length - left;Decreased weight shift to right;Decreased weight shift to left;Left flexed knee in stance;Right genu recurvatum;Trunk  flexed;Poor foot clearance - left;Poor foot clearance - right   Ambulation Surface Level     Exercises   Exercises Knee/Hip     Knee/Hip Exercises: Stretches   Passive Hamstring Stretch Both;1 rep;30 seconds   Passive Hamstring Stretch Limitations supine   Hip Flexor Stretch Both;1 rep;60 seconds   Hip Flexor Stretch Limitations lying flat/supine on mat table     Knee/Hip Exercises: Supine   Heel Slides AROM;Strengthening;Both;1 set   Heel Slides Limitations resisted hip/knee extension   Bridges Limitations discussed at end of session; pt reports they cause back pain; encouraged to do mini-bridges                 PT Education - 12/19/16 1719    Education provided Yes   Education Details importance of HEP (she reports she is doing)   Forensic psychologist) Educated Patient   Methods Explanation   Comprehension Verbalized understanding          PT Short Term Goals - 11/26/16 1300      PT SHORT TERM GOAL #1   Title Pt will be IND with HEP to improve strength, endurance, balance, and flexibility. TARGET DATE FOR ALL STGs: 11/29/16   Status Achieved     PT SHORT TERM GOAL #2   Title Pt will perform STS txfs at MOD I level with RW to improve functional mobility.    Status Partially Met     PT SHORT TERM GOAL #3   Title Pt will amb. 100' with RW, at MOD I level, over even terrain to safely amb. at home.    Status Not Met     PT SHORT TERM GOAL #4   Title Assess BERG and TUG and write goal once appropriate   Status Deferred           PT Long Term Goals - 11/26/16 1302      PT LONG TERM GOAL #1   Title Pt will amb. 300' over even and paved surfaces, LRAD, at MOD I level, in order to amb. to/from MD appointments. TARGET DATE FOR ALL LTGS: 12/26/16   Status New     PT LONG TERM GOAL #2   Title Pt will perform STS txfs at MOD I level to improve safety during functional mobility.   Status New     PT LONG TERM GOAL #3   Title Pt will perform stand pivot txfs to/from chair to  bed with RW at MOD I level to improve functional mobility.    Status New     PT LONG TERM GOAL #4   Title Pt will verbalize fall prevention strategies to reduce falls risk.    Status New  Plan - 12/19/16 1722    Clinical Impression Statement Session focused on initial stretching and LE exercises with progression to pre-gait and gait training. Pt did not want to use platform walker with chest pad and seat stating she felt "hunched over and that seat was in the way." Was able to ambulate with RW and second person following closely with W/C) x4 with seated rest breaks between. Pt is eager to continue gait training.    Rehab Potential Good   Clinical Impairments Affecting Rehab Potential extensive PMH   PT Frequency 2x / week   PT Duration 8 weeks   PT Treatment/Interventions ADLs/Self Care Home Management;Biofeedback;Canalith Repostioning;Electrical Stimulation;Neuromuscular re-education;Balance training;Therapeutic exercise;Manual techniques;Therapeutic activities;Functional mobility training;Gait training;DME Instruction;Orthotic Fit/Training;Patient/family education;Vestibular;Stair training   PT Next Visit Plan promote hip extension strength, hamstring activation; standing with improved upright posture to take weight off of UE and practicing unlocking Rt knee, gait with RW and 2nd person follow with w/c; stretching to promote shoulder extension, depression and ER,    PT Home Exercise Plan Balance and strengthening HEP   Consulted and Agree with Plan of Care Patient      Patient will benefit from skilled therapeutic intervention in order to improve the following deficits and impairments:  Abnormal gait, Decreased endurance, Impaired sensation, Decreased strength, Decreased knowledge of use of DME, Decreased balance, Decreased mobility, Decreased range of motion, Decreased coordination, Impaired flexibility, Postural dysfunction  Visit Diagnosis: Unsteadiness on  feet  Other abnormalities of gait and mobility  Other disturbances of skin sensation     Problem List Patient Active Problem List   Diagnosis Date Noted  . Urinary retention   . Acute pain of right knee   . Acute lower UTI   . Weakness of right lower extremity   . Confusion   . Hydrocephalus   . Poor appetite   . Acute blood loss anemia   . Post-operative pain   . Hypokalemia   . Leukocytosis   . Thrombocytopenia ([place])   . Acute deep vein thrombosis (DVT) of femoral vein of left lower extremity ([place])   . Myelopathy ([place]) 07/24/2016  . Thoracic myelopathy   . Depression   . Benign essential HTN   . History of subarachnoid hemorrhage   . Hypercoagulable state ([place])   . Constipation due to pain medication   . 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 Protein-calorie malnutrition ([place]) 12/21/2014  . Thyroid activity decreased 12/21/2014  . 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 Essential hypertension 12/13/2014  . Anemia, iron deficiency 12/13/2014  . Hypothyroidism 12/13/2014  . Prediabetes 12/13/2014    Rexanne Mano, PT 12/19/2016, 5:34 PM  [facility] [address] [place], [place], [address] Phone: [phone]   Fax:  [phone]  Name: Katrina Donaldson MRN: [id number] Date of Birth: 1955/08/20

## 2016-12-23 ENCOUNTER — Ambulatory Visit: Payer: BC Managed Care – PPO | Admitting: Physical Therapy

## 2016-12-23 ENCOUNTER — Encounter: Payer: Self-pay | Admitting: Physical Therapy

## 2016-12-23 DIAGNOSIS — R208 Other disturbances of skin sensation: Secondary | ICD-10-CM

## 2016-12-23 DIAGNOSIS — R2689 Other abnormalities of gait and mobility: Secondary | ICD-10-CM | POA: Diagnosis not present

## 2016-12-23 DIAGNOSIS — M4714 Other spondylosis with myelopathy, thoracic region: Secondary | ICD-10-CM

## 2016-12-23 DIAGNOSIS — M6281 Muscle weakness (generalized): Secondary | ICD-10-CM

## 2016-12-23 DIAGNOSIS — R2681 Unsteadiness on feet: Secondary | ICD-10-CM

## 2016-12-23 NOTE — Therapy (Signed)
[facility] [address] [place], [place], [address] Phone: [phone]   Fax:  [phone]  Physical Therapy Treatment  Patient Details  Name: Katrina Donaldson MRN: [id number] Date of Birth: 1956/04/16 Referring Provider: Dr. Posey Pronto  Encounter Date: 12/23/2016      PT End of Session - 12/23/16 1728    Visit Number 10   Number of Visits 17   Date for PT Re-Evaluation 12/31/16   Authorization Type BCBS State, Medicare   PT Start Time 1320   PT Stop Time 1405   PT Time Calculation (min) 45 min   Equipment Utilized During Treatment Gait belt   Activity Tolerance Patient tolerated treatment well   Behavior During Therapy [facility] for tasks assessed/performed      Past Medical History:  Diagnosis Date  . Brain aneurysm   . Depression   . History of DVT (deep vein thrombosis)   . Hypercoagulable state ([place])   . Hypertension   . Hypothyroidism   . Stroke [facility]) 2016   [facility] 11/29/14    Past Surgical History:  Procedure Laterality Date  . ABDOMINAL HYSTERECTOMY     patient denies  . BRAIN SURGERY     aneurysm  . goiter    . LAMINECTOMY N/A 07/19/2016   Procedure: LAMINECTOMY THORACIC FOUR THORACIC FIVE, THORACIC SIX TO THORACIC EIGHT,THORACIC TEN TO LUMBAR ONE, FENESTRATION OF ARACHNOID CYSTS;  Surgeon: Consuella Lose, MD;  Location: [facility];  Service: Neurosurgery;  Laterality: N/A;  . THYROIDECTOMY      There were no vitals filed for this visit.      Subjective Assessment - 12/23/16 1325    Subjective Pt denied falls or changes since last visit.   Shoulder pain has improved from last visit.  OT referral has been placed, will request OT evaluation appt today.   Pertinent History SAH, aneurym, HTN, DVT R iliac, hypothyroidism, R hydrocephalus, hypercoagulable state   Patient Stated Goals "I want to be walking again without support, I want to be back to where I was."   Currently in Pain? No/denies             [facility] PT Assessment - 12/23/16 1723      Transfers   Transfers Sit to Stand;Stand to Sit;Stand Pivot Transfers   Sit to Stand 3: Mod assist;With upper extremity assist;With armrests   Sit to Stand Details (indicate cue type and reason) continues to require verbal cues for hand placement and assistance to bring COG forwards of BOS and assistance to maintain balance while performing LE and trunk extension to stand fully upright   Stand to Sit 3: Mod assist;With upper extremity assist;With armrests   Stand Pivot Transfers 3: Mod assist   Stand Pivot Transfer Details (indicate cue type and reason) With RW and verbal cues for sequencing and to prevent LE buckling     Ambulation/Gait   Ambulation/Gait Yes   Ambulation/Gait Assistance 3: Mod assist   Ambulation/Gait Assistance Details Performed first gait attempt with R pre-tibial AFO for increased DF assistance and knee control; due to spring action of AFO pt demonstrated decreased motor control and increased scissoring of RLE and increased knee buckling; removed AFO and pt demonstrated improved control, decreased buckling but increased difficulty with foot clearance during swing   Ambulation Distance (Feet) 15 Feet   Assistive device Rolling walker   Gait Pattern Step-through pattern;Decreased step length - right;Decreased step length - left;Decreased stride length;Decreased hip/knee flexion - right;Decreased dorsiflexion - right;Scissoring;Decreased trunk rotation;Trunk  rotated posteriorly on left;Trunk rotated posteriorly on right;Trunk flexed;Poor foot clearance - right;Right genu recurvatum;Left genu recurvatum   Ambulation Surface Level;Indoor     Standardized Balance Assessment   Standardized Balance Assessment Timed Up and Go Test     Timed Up and Go Test   TUG Normal TUG   Normal TUG (seconds) 206   TUG Comments with RW, w/c follow and mod-max A for balance                             PT Education - 12/23/16 1728     Education provided Yes   Education Details discussed possible custom molded AFO R foot   Person(s) Educated Patient   Methods Explanation   Comprehension Need further instruction          PT Short Term Goals - 11/26/16 1300      PT SHORT TERM GOAL #1   Title Pt will be IND with HEP to improve strength, endurance, balance, and flexibility. TARGET DATE FOR ALL STGs: 11/29/16   Status Achieved     PT SHORT TERM GOAL #2   Title Pt will perform STS txfs at MOD I level with RW to improve functional mobility.    Status Partially Met     PT SHORT TERM GOAL #3   Title Pt will amb. 100' with RW, at MOD I level, over even terrain to safely amb. at home.    Status Not Met     PT SHORT TERM GOAL #4   Title Assess BERG and TUG and write goal once appropriate   Status Deferred           PT Long Term Goals - 12/23/16 1731      PT LONG TERM GOAL #1   Title Pt will amb. 300' over even and paved surfaces, LRAD, at MOD I level, in order to amb. to/from MD appointments. TARGET DATE FOR ALL LTGS: 12/31/16   Status On-going     PT LONG TERM GOAL #2   Title Pt will perform STS txfs at MOD I level to improve safety during functional mobility.   Status On-going     PT LONG TERM GOAL #3   Title Pt will perform stand pivot txfs to/from chair to bed with RW at MOD I level to improve functional mobility.    Status On-going     PT LONG TERM GOAL #4   Title Pt will verbalize fall prevention strategies to reduce falls risk.    Status On-going     PT LONG TERM GOAL #5   Title Pt will improve safety with gait as indicated by TUG decreased to <3 minutes with min A.   Baseline 3:26 with mod A with RW   Status New               Plan - 12/23/16 1729    Clinical Impression Statement Continued to focus on gait training with trial of off the shelf AFO; pt demonstrated improved foot clearance but more ataxic gait and knee buckling due to spring action of pre-tibial AFO.  Pt may still benefit  from custom molded AFO to improve foot clearance and knee control but pt may not be able to purchase larger shoes and may not be able to don/doff independently.  Will continue to discuss.  Obtained baseline for objective balance measure-TUG.  Will continue to address.   Rehab Potential Good   Clinical Impairments Affecting  Rehab Potential extensive PMH   PT Frequency 2x / week   PT Duration 8 weeks   PT Treatment/Interventions ADLs/Self Care Home Management;Biofeedback;Canalith Repostioning;Electrical Stimulation;Neuromuscular re-education;Balance training;Therapeutic exercise;Manual techniques;Therapeutic activities;Functional mobility training;Gait training;DME Instruction;Orthotic Fit/Training;Patient/family education;Vestibular;Stair training   PT Next Visit Plan Continue to discuss custom molded AFO; sit<>stand training, promote hip extension strength, hamstring activation; standing with improved upright posture to take weight off of UE and practicing unlocking Rt knee, gait with RW and 2nd person follow with w/c; stretching to promote shoulder extension, depression and ER,    PT Home Exercise Plan Balance and strengthening HEP   Consulted and Agree with Plan of Care Patient      Patient will benefit from skilled therapeutic intervention in order to improve the following deficits and impairments:  Abnormal gait, Decreased endurance, Impaired sensation, Decreased strength, Decreased knowledge of use of DME, Decreased balance, Decreased mobility, Decreased range of motion, Decreased coordination, Impaired flexibility, Postural dysfunction  Visit Diagnosis: Other abnormalities of gait and mobility  Muscle weakness (generalized)  Other disturbances of skin sensation  Unsteadiness on feet  Thoracic spondylosis with myelopathy       G-Codes - Jan 16, 2017 1734    Functional Assessment Tool Used (Outpatient Only) TUG: 3:26 seconds, mod A for transfers and gait 15' with RW   Functional  Limitation Mobility: Walking and moving around   Mobility: Walking and Moving Around Current Status [phone]) At least 60 percent but less than 80 percent impaired, limited or restricted   Mobility: Walking and Moving Around Goal Status [phone]) At least 20 percent but less than 40 percent impaired, limited or restricted      Problem List Patient Active Problem List   Diagnosis Date Noted  . Urinary retention   . Acute pain of right knee   . Acute lower UTI   . Weakness of right lower extremity   . Confusion   . Hydrocephalus   . Poor appetite   . Acute blood loss anemia   . Post-operative pain   . Hypokalemia   . Leukocytosis   . Thrombocytopenia ([place])   . Acute deep vein thrombosis (DVT) of femoral vein of left lower extremity ([place])   . Myelopathy ([place]) 07/24/2016  . Thoracic myelopathy   . Depression   . Benign essential HTN   . History of subarachnoid hemorrhage   . Hypercoagulable state ([place])   . Constipation due to pain medication   . 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 Protein-calorie malnutrition ([place]) 12/21/2014  . Thyroid activity decreased 12/21/2014  . 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 Essential hypertension 12/13/2014  . Anemia, iron deficiency 12/13/2014  . Hypothyroidism 12/13/2014  . Prediabetes 12/13/2014  Physical Therapy Progress Note  Dates of Reporting Period: 11/01/16 to 2017-01-16  Objective Reports of Subjective Statement: See above  Objective Measurements: TUG  Goal Update: See LTG above  Plan: Continue POC  Reason Skilled Services are Required: To address mm weakness, impaired motor planning/control, impaired postural control, balance, gait, transfers, maximize independence and decrease risk for falls.   Raylene Everts, PT, DPT 01/16/2017    5:37 PM    [facility] [address] [place], [place], [address] Phone: [phone]   Fax:   [phone]  Name: Katrina Donaldson MRN: [id number] Date of Birth: [birth year]

## 2016-12-25 ENCOUNTER — Ambulatory Visit: Payer: BC Managed Care – PPO | Admitting: Physical Therapy

## 2016-12-25 ENCOUNTER — Encounter: Payer: Self-pay | Admitting: Physical Therapy

## 2016-12-25 DIAGNOSIS — R2681 Unsteadiness on feet: Secondary | ICD-10-CM

## 2016-12-25 DIAGNOSIS — M6281 Muscle weakness (generalized): Secondary | ICD-10-CM

## 2016-12-25 DIAGNOSIS — R2689 Other abnormalities of gait and mobility: Secondary | ICD-10-CM

## 2016-12-25 DIAGNOSIS — R208 Other disturbances of skin sensation: Secondary | ICD-10-CM

## 2016-12-25 NOTE — Therapy (Signed)
[facility] [address] [place], [place], [address] Phone: [phone]   Fax:  [phone]  Physical Therapy Treatment  Patient Details  Name: Katrina Donaldson MRN: [id number] Date of Birth: [birth year] Referring Provider: Dr. Posey Pronto  Encounter Date: 12/25/2016      PT End of Session - 12/25/16 1112    Visit Number 11   Number of Visits 17   Date for PT Re-Evaluation 12/31/16   Authorization Type BCBS State, Medicare   PT Start Time 1022   PT Stop Time 1103   PT Time Calculation (min) 41 min   Activity Tolerance Patient tolerated treatment well   Behavior During Therapy [facility] for tasks assessed/performed      Past Medical History:  Diagnosis Date  . Brain aneurysm   . Depression   . History of DVT (deep vein thrombosis)   . Hypercoagulable state ([place])   . Hypertension   . Hypothyroidism   . Stroke [facility]) 2016   [facility] 11/29/14    Past Surgical History:  Procedure Laterality Date  . ABDOMINAL HYSTERECTOMY     patient denies  . BRAIN SURGERY     aneurysm  . goiter    . LAMINECTOMY N/A 07/19/2016   Procedure: LAMINECTOMY THORACIC FOUR THORACIC FIVE, THORACIC SIX TO THORACIC EIGHT,THORACIC TEN TO LUMBAR ONE, FENESTRATION OF ARACHNOID CYSTS;  Surgeon: Consuella Lose, MD;  Location: [facility];  Service: Neurosurgery;  Laterality: N/A;  . THYROIDECTOMY      There were no vitals filed for this visit.      Subjective Assessment - 12/25/16 1025    Subjective Was able to get OT evaluation scheduled.  No pain or issues today to report.   Pertinent History SAH, aneurym, HTN, DVT R iliac, hypothyroidism, R hydrocephalus, hypercoagulable state   Patient Stated Goals "I want to be walking again without support, I want to be back to where I was."   Currently in Pain? No/denies                         [facility] Adult PT Treatment/Exercise - 12/25/16 1107      Ambulation/Gait   Ambulation/Gait Yes   Ambulation/Gait Assistance 4: Min assist;3: Mod assist   Ambulation/Gait Assistance Details Performed gait with UPWalker to provide increased UE support to promote more upright posture, shoulder depression.  Resulted in improved foot clearance, step length during advancement and improved hip and knee extensor activation during stance phase with no episodes of buckling today and decreased recurvatum.   Ambulation Distance (Feet) 115 Feet   Assistive device Eva walker  UP Walker   Gait Pattern Step-through pattern;Decreased stride length;Decreased hip/knee flexion - right;Decreased dorsiflexion - right;Trunk flexed;Narrow base of support;Poor foot clearance - right   Ambulation Surface Level;Indoor     Neuro Re-ed    Neuro Re-ed Details  Performed multiple reps sit > sustained squat from elevated mat with focus on maintaining upright trunk, forwards translation of COG over BOS and activation of hip and knee extensors-while maintaining sustained squat performed lateral weight shifting with heel lifts and then foot lifts with min-mod A.       Exercises   Exercises Shoulder     Shoulder Exercises: Seated   Other Seated Exercises Performed seated w/c pushups with shoulder depression and extension to focus on latissimus and tricep strengthening x 5 reps                PT Education - 12/25/16  1112    Education provided Yes   Education Details w/c push ups at home for UE strengthening   Person(s) Educated Patient   Methods Explanation;Demonstration   Comprehension Verbalized understanding;Returned demonstration          PT Short Term Goals - 11/26/16 1300      PT SHORT TERM GOAL #1   Title Pt will be IND with HEP to improve strength, endurance, balance, and flexibility. TARGET DATE FOR ALL STGs: 11/29/16   Status Achieved     PT SHORT TERM GOAL #2   Title Pt will perform STS txfs at MOD I level with RW to improve functional mobility.    Status Partially Met     PT SHORT TERM GOAL  #3   Title Pt will amb. 100' with RW, at MOD I level, over even terrain to safely amb. at home.    Status Not Met     PT SHORT TERM GOAL #4   Title Assess BERG and TUG and write goal once appropriate   Status Deferred           PT Long Term Goals - 12/23/16 1731      PT LONG TERM GOAL #1   Title Pt will amb. 300' over even and paved surfaces, LRAD, at MOD I level, in order to amb. to/from MD appointments. TARGET DATE FOR ALL LTGS: 12/31/16   Status On-going     PT LONG TERM GOAL #2   Title Pt will perform STS txfs at MOD I level to improve safety during functional mobility.   Status On-going     PT LONG TERM GOAL #3   Title Pt will perform stand pivot txfs to/from chair to bed with RW at MOD I level to improve functional mobility.    Status On-going     PT LONG TERM GOAL #4   Title Pt will verbalize fall prevention strategies to reduce falls risk.    Status On-going     PT LONG TERM GOAL #5   Title Pt will improve safety with gait as indicated by TUG decreased to <3 minutes with min A.   Baseline 3:26 with mod A with RW   Status New               Plan - 12/25/16 1113    Clinical Impression Statement Treatment session today with focus on NMR for weight shifting COG over BOS for increased LE extensor activation for improved sit > stand sequence and stance control during gait.  Peformed gait today with UPWalker with improved upright posture and gait sequence, improved endurance and decreased UE pain. Pt tolerated well and demonstrated significant progress today.  Will continue to progress.   Rehab Potential Good   Clinical Impairments Affecting Rehab Potential extensive PMH   PT Frequency 2x / week   PT Duration 8 weeks   PT Treatment/Interventions ADLs/Self Care Home Management;Biofeedback;Canalith Repostioning;Electrical Stimulation;Neuromuscular re-education;Balance training;Therapeutic exercise;Manual techniques;Therapeutic activities;Functional mobility  training;Gait training;DME Instruction;Orthotic Fit/Training;Patient/family education;Vestibular;Stair training   PT Next Visit Plan REASSESS GOALS AND RECERT/ADD VISITS NEXT WEEK!  Continue to discuss custom molded AFO; sit<>stand training, promote hip extension strength, hamstring activation; standing with improved upright posture to take weight off of UE and practicing unlocking Rt knee, gait with UP Walker and 2nd person follow with w/c; stretching to promote hip extension, shoulder extension, depression and ER,    PT Home Exercise Plan Balance and strengthening HEP   Consulted and Agree with Plan of Care Patient  Patient will benefit from skilled therapeutic intervention in order to improve the following deficits and impairments:  Abnormal gait, Decreased endurance, Impaired sensation, Decreased strength, Decreased knowledge of use of DME, Decreased balance, Decreased mobility, Decreased range of motion, Decreased coordination, Impaired flexibility, Postural dysfunction  Visit Diagnosis: Other abnormalities of gait and mobility  Muscle weakness (generalized)  Other disturbances of skin sensation  Unsteadiness on feet     Problem List Patient Active Problem List   Diagnosis Date Noted  . Urinary retention   . Acute pain of right knee   . Acute lower UTI   . Weakness of right lower extremity   . Confusion   . Hydrocephalus   . Poor appetite   . Acute blood loss anemia   . Post-operative pain   . Hypokalemia   . Leukocytosis   . Thrombocytopenia ([place])   . Acute deep vein thrombosis (DVT) of femoral vein of left lower extremity ([place])   . Myelopathy ([place]) 07/24/2016  . Thoracic myelopathy   . Depression   . Benign essential HTN   . History of subarachnoid hemorrhage   . Hypercoagulable state ([place])   . Constipation due to pain medication   . 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 Protein-calorie malnutrition ([place]) 12/21/2014  . Thyroid activity decreased 12/21/2014  .  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 Essential hypertension 12/13/2014  . Anemia, iron deficiency 12/13/2014  . Hypothyroidism 12/13/2014  . Prediabetes 12/13/2014    Raylene Everts, PT, DPT 12/25/16    11:17 AM    [facility] [address] [place], [place], [address] Phone: [phone]   Fax:  [phone]  Name: Katrina Donaldson MRN: [id number] Date of Birth: Apr 07, 1956

## 2016-12-30 ENCOUNTER — Ambulatory Visit: Payer: BC Managed Care – PPO | Attending: Physical Medicine & Rehabilitation | Admitting: Physical Therapy

## 2016-12-30 ENCOUNTER — Encounter: Payer: Self-pay | Admitting: Physical Therapy

## 2016-12-30 DIAGNOSIS — R2689 Other abnormalities of gait and mobility: Secondary | ICD-10-CM | POA: Insufficient documentation

## 2016-12-30 DIAGNOSIS — R208 Other disturbances of skin sensation: Secondary | ICD-10-CM | POA: Insufficient documentation

## 2016-12-30 DIAGNOSIS — R2681 Unsteadiness on feet: Secondary | ICD-10-CM | POA: Insufficient documentation

## 2016-12-30 DIAGNOSIS — M6281 Muscle weakness (generalized): Secondary | ICD-10-CM | POA: Diagnosis present

## 2016-12-30 NOTE — Therapy (Signed)
[facility] [address] [place], [place], [address] Phone: [phone]   Fax:  [phone]  Physical Therapy Treatment  Patient Details  Name: Katrina Donaldson MRN: [id number] Date of Birth: [birth year] Referring Provider: Dr. Posey Pronto  Encounter Date: 12/30/2016      PT End of Session - 12/30/16 1206    Visit Number 12   Number of Visits 17   Date for PT Re-Evaluation 12/31/16   Authorization Type [employer] State, [place] code and PN every 10th visit   PT Start Time 1020   PT Stop Time 1100   PT Time Calculation (min) 40 min   Equipment Utilized During Treatment Gait belt   Activity Tolerance Patient tolerated treatment well   Behavior During Therapy [facility] for tasks assessed/performed      Past Medical History:  Diagnosis Date  . Brain aneurysm   . Depression   . History of DVT (deep vein thrombosis)   . Hypercoagulable state ([place])   . Hypertension   . Hypothyroidism   . Stroke [facility]) 2016   [facility] 11/29/14    Past Surgical History:  Procedure Laterality Date  . ABDOMINAL HYSTERECTOMY     patient denies  . BRAIN SURGERY     aneurysm  . goiter    . LAMINECTOMY N/A 07/19/2016   Procedure: LAMINECTOMY THORACIC FOUR THORACIC FIVE, THORACIC SIX TO THORACIC EIGHT,THORACIC TEN TO LUMBAR ONE, FENESTRATION OF ARACHNOID CYSTS;  Surgeon: Consuella Lose, MD;  Location: [facility] [place];  Service: Neurosurgery;  Laterality: N/A;  . THYROIDECTOMY      There were no vitals filed for this visit.      Subjective Assessment - 12/30/16 1033    Subjective Uneventful weekend, no issues.   Pertinent History SAH, aneurym, HTN, DVT R iliac, hypothyroidism, R hydrocephalus, hypercoagulable state   Patient Stated Goals "I want to be walking again without support, I want to be back to where I was."   Currently in Pain? No/denies                         [facility] Adult PT Treatment/Exercise - 12/30/16 1033      Bed Mobility    Rolling Right 4: Min assist   Right Sidelying to Sit 4: Min assist   Left Sidelying to Sit 4: Min assist   Sit to Supine 4: Min assist;3: Mod assist     Transfers   Transfers Sit to [employer] Transfers   Sit to Stand 3: Mod assist   Stand Pivot Transfers 3: Mod assist   Squat Pivot Transfers 6: Modified independent (Device/Increase time)   Lateral/Scoot Transfers 6: Modified independent (Device/Increase time)     Ambulation/Gait   Ambulation/Gait Yes   Ambulation/Gait Assistance 4: Min assist   Ambulation/Gait Assistance Details Continued gait training with UP Walker following stretching with pt demonstrating improved upright posture and no episodes of LE buckling; continued to require cues for lateral weight shifting and activation in stance phase to allow full step length and foot clearance with contralateral LE.  Reported shoulder fatigue after one lap around track.   Ambulation Distance (Feet) 115 Feet   Assistive device Eva walker  UP Walker   Gait Pattern Step-through pattern;Decreased step length - right;Decreased step length - left;Decreased stride length;Decreased hip/knee flexion - right;Decreased dorsiflexion - right;Decreased weight shift to left;Right genu recurvatum;Trunk flexed;Poor foot clearance - left;Poor foot clearance - right   Ambulation Surface Level;Indoor     Self-Care  Self-Care Other Self-Care Comments   Other Self-Care Comments  Demonstrated and discussed trial of foot up brace on R foot for DF assistance.  Also discussed recertification for 8 more weeks, 2x/week     Knee/Hip Exercises: Stretches   Hip Flexor Stretch Right;Left;1 rep;60 seconds   Hip Flexor Stretch Limitations supine with LE off side of mat     Knee/Hip Exercises: Supine   Bridges Strengthening;Both;10 reps   Bridges Limitations no back pain today                PT Education - 12/30/16 1206    Education provided Yes   Education Details gait, foot up brace,  recertification at next visit   Person(s) Educated Patient   Methods Explanation;Demonstration   Comprehension Verbalized understanding          PT Short Term Goals - 12/30/16 1209      PT SHORT TERM GOAL #1   Title Pt will be IND with HEP to improve strength, endurance, balance, and flexibility. NEW TARGET DATE FOR ALL STGs     PT SHORT TERM GOAL #2   Title Pt will perform sit to stand and stand pivot transfers with RW with min A    Baseline Mod A   Time 4   Period Weeks   Status Revised     PT SHORT TERM GOAL #3   Title Pt will amb. 115' with RW with min A over level, indoor surfaces.   Baseline min-mod with Up Walker, 115'   Time 4   Period Weeks   Status Revised     PT SHORT TERM GOAL #4   Title Improve TUG to <3 minutes with RW and min A   Time 4   Period Weeks   Status Revised           PT Long Term Goals - 12/30/16 1211      PT LONG TERM GOAL #1   Title Pt will amb. 300' over even and paved surfaces, LRAD, at MOD I level, in order to amb. to/from MD appointments. TARGET DATE FOR ALL LTGS: 12/31/16   Status Not Met     PT LONG TERM GOAL #2   Title Pt will perform STS txfs at MOD I level to improve safety during functional mobility.   Status Not Met     PT LONG TERM GOAL #3   Title Pt will perform stand pivot txfs to/from chair to bed with RW at MOD I level to improve functional mobility.    Status Not Met     PT LONG TERM GOAL #4   Title Pt will verbalize fall prevention strategies to reduce falls risk.    Status Not Met     PT LONG TERM GOAL #5   Title Pt will improve safety with gait as indicated by TUG decreased to <2.5 minutes with min A.   Baseline 3:26 with mod A with RW   Status Revised               Plan - 12/30/16 1207    Clinical Impression Statement Continued gait training with upright RW with pt performing hip flexor strengthening and hip extensor activation training on mat prior to gait.  Pt able to perform full lap around gym  without LE buckling but continues to report shoulder fatigue indicating continued increased WB/support through UE.  Will continue to address; plan to recertify next visit for 8 more weeks, 2x/week with addition of OT.  Pt  agreeable.   Rehab Potential Good   Clinical Impairments Affecting Rehab Potential extensive PMH   PT Frequency 2x / week   PT Duration 8 weeks   PT Treatment/Interventions ADLs/Self Care Home Management;Biofeedback;Canalith Repostioning;Electrical Stimulation;Neuromuscular re-education;Balance training;Therapeutic exercise;Manual techniques;Therapeutic activities;Functional mobility training;Gait training;DME Instruction;Orthotic Fit/Training;Patient/family education;Vestibular;Stair training   PT Next Visit Plan REASSESS GOALS AND RECERT!  Trial foot up brace with gait; sit<>stand training, promote hip extension strength, hamstring activation; standing with improved upright posture to take weight off of UE and practicing unlocking Rt knee, gait with UP Walker and 2nd person follow with w/c; stretching to promote hip extension, shoulder extension, depression and ER,    PT Home Exercise Plan Balance and strengthening HEP   Consulted and Agree with Plan of Care Patient      Patient will benefit from skilled therapeutic intervention in order to improve the following deficits and impairments:  Abnormal gait, Decreased endurance, Impaired sensation, Decreased strength, Decreased knowledge of use of DME, Decreased balance, Decreased mobility, Decreased range of motion, Decreased coordination, Impaired flexibility, Postural dysfunction  Visit Diagnosis: Other abnormalities of gait and mobility  Muscle weakness (generalized)  Other disturbances of skin sensation  Unsteadiness on feet     Problem List Patient Active Problem List   Diagnosis Date Noted  . Urinary retention   . Acute pain of right knee   . Acute lower UTI   . Weakness of right lower extremity   . Confusion    . Hydrocephalus   . Poor appetite   . Acute blood loss anemia   . Post-operative pain   . Hypokalemia   . Leukocytosis   . Thrombocytopenia ([place])   . Acute deep vein thrombosis (DVT) of femoral vein of left lower extremity ([place])   . Myelopathy ([place]) 07/24/2016  . Thoracic myelopathy   . Depression   . Benign essential HTN   . History of subarachnoid hemorrhage   . Hypercoagulable state ([place])   . Constipation due to pain medication   . 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 Protein-calorie malnutrition ([place]) 12/21/2014  . Thyroid activity decreased 12/21/2014  . 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 Essential hypertension 12/13/2014  . Anemia, iron deficiency 12/13/2014  . Hypothyroidism 12/13/2014  . Prediabetes 12/13/2014    Raylene Everts, PT, DPT 12/30/16    12:13 PM    [facility] [address] [place], [place], [address] Phone: [phone]   Fax:  [phone]  Name: Katrina Donaldson MRN: [id number] Date of Birth: [birth year]

## 2016-12-31 ENCOUNTER — Ambulatory Visit: Payer: BC Managed Care – PPO | Admitting: Physical Therapy

## 2016-12-31 ENCOUNTER — Ambulatory Visit: Payer: BC Managed Care – PPO | Admitting: Occupational Therapy

## 2016-12-31 ENCOUNTER — Encounter: Payer: Self-pay | Admitting: Physical Therapy

## 2016-12-31 DIAGNOSIS — R2689 Other abnormalities of gait and mobility: Secondary | ICD-10-CM

## 2016-12-31 DIAGNOSIS — M6281 Muscle weakness (generalized): Secondary | ICD-10-CM

## 2016-12-31 DIAGNOSIS — R208 Other disturbances of skin sensation: Secondary | ICD-10-CM

## 2016-12-31 DIAGNOSIS — R2681 Unsteadiness on feet: Secondary | ICD-10-CM

## 2016-12-31 NOTE — Therapy (Signed)
[facility] [address] [place], [place], [address] Phone: [phone]   Fax:  [phone]  Physical Therapy Treatment  Patient Details  Name: Katrina Donaldson MRN: [id number] Date of Birth: 1955-08-02 Referring Provider: Dr. Posey Pronto  Encounter Date: 12/31/2016      PT End of Session - 12/31/16 1114    Visit Number 13   Number of Visits 17   Date for PT Re-Evaluation 59/56/38  recertification today   Authorization Type [place], [place] code and PN every 10th visit   PT Start Time 1031  arrived late due to transportation issues   PT Stop Time 1105   PT Time Calculation (min) 34 min   Activity Tolerance Patient tolerated treatment well   Behavior During Therapy [facility] for tasks assessed/performed      Past Medical History:  Diagnosis Date  . Brain aneurysm   . Depression   . History of DVT (deep vein thrombosis)   . Hypercoagulable state ([place])   . Hypertension   . Hypothyroidism   . Stroke [facility]) 2016   [facility] 11/29/14    Past Surgical History:  Procedure Laterality Date  . ABDOMINAL HYSTERECTOMY     patient denies  . BRAIN SURGERY     aneurysm  . goiter    . LAMINECTOMY N/A 07/19/2016   Procedure: LAMINECTOMY THORACIC FOUR THORACIC FIVE, THORACIC SIX TO THORACIC EIGHT,THORACIC TEN TO LUMBAR ONE, FENESTRATION OF ARACHNOID CYSTS;  Surgeon: Consuella Lose, MD;  Location: [facility];  Service: Neurosurgery;  Laterality: N/A;  . THYROIDECTOMY      There were no vitals filed for this visit.      Subjective Assessment - 12/31/16 1109    Subjective Arrived late due to transportation issues; has OT evaluation next.  Was tired after session yesterday-went home and slept   Pertinent History SAH, aneurym, HTN, DVT R iliac, hypothyroidism, R hydrocephalus, hypercoagulable state   Patient Stated Goals "I want to be walking again without support, I want to be back to where I was."   Currently in Pain? No/denies                          [facility] Adult PT Treatment/Exercise - 12/31/16 1110      Ambulation/Gait   Ambulation/Gait Yes   Ambulation/Gait Assistance 4: Min assist;3: Mod assist   Ambulation/Gait Assistance Details Continued gait training with pt demonstrating improved upright trunk overall until end; as pt fatigued she demonstrated increased trunk flexion, R hip retraction and R recurvatum.  Improved stance on RLE with improved L step length and foot clearance today.  One episode of LE buckling when stepping too far with LLE.  Mod A to correct   Ambulation Distance (Feet) 115 Feet   Assistive device Eva walker  UpWalker   Gait Pattern Step-through pattern;Decreased step length - left;Decreased stance time - right;Decreased stride length;Decreased hip/knee flexion - right;Decreased dorsiflexion - right;Decreased weight shift to right;Right genu recurvatum;Trunk rotated posteriorly on right;Trunk flexed;Poor foot clearance - right   Ambulation Surface Level;Indoor     Knee/Hip Exercises: Standing   Forward Step Up Right;Left;1 set;10 reps;Hand Hold: 2;Step Height: 2"     Shoulder Exercises: Standing   Flexion Right;Left;Other (comment)   Flexion Limitations Alternating L and RUE flexion-releasing // bars x 3 reps each side to focus on increased trunk and LE activation and WB through bilat LE and decreased dependence/WB through UE; focus on more upright trunk posture for standing/gait  PT Education - 12/30/16 1206    Education provided Yes   Education Details gait, foot up brace, recertification at next visit   Person(s) Educated Patient   Methods Explanation;Demonstration   Comprehension Verbalized understanding          PT Short Term Goals - 12/30/16 1209      PT SHORT TERM GOAL #1   Title Pt will be IND with HEP to improve strength, endurance, balance, and flexibility. NEW TARGET DATE FOR ALL STGs     PT SHORT TERM GOAL #2   Title Pt will  perform sit to stand and stand pivot transfers with RW with min A    Baseline Mod A   Time 4   Period Weeks   Status Revised     PT SHORT TERM GOAL #3   Title Pt will amb. 115' with RW with min A over level, indoor surfaces.   Baseline min-mod with Up Walker, 115'   Time 4   Period Weeks   Status Revised     PT SHORT TERM GOAL #4   Title Improve TUG to <3 minutes with RW and min A   Time 4   Period Weeks   Status Revised           PT Long Term Goals - 12/30/16 1211      PT LONG TERM GOAL #1   Title Pt will amb. 300' over even and paved surfaces, LRAD, at MOD I level, in order to amb. to/from MD appointments. TARGET DATE FOR ALL LTGS: 12/31/16   Status Not Met     PT LONG TERM GOAL #2   Title Pt will perform STS txfs at MOD I level to improve safety during functional mobility.   Status Not Met     PT LONG TERM GOAL #3   Title Pt will perform stand pivot txfs to/from chair to bed with RW at MOD I level to improve functional mobility.    Status Not Met     PT LONG TERM GOAL #4   Title Pt will verbalize fall prevention strategies to reduce falls risk.    Status Not Met     PT LONG TERM GOAL #5   Title Pt will improve safety with gait as indicated by TUG decreased to <2.5 minutes with min A.   Baseline 3:26 with mod A with RW   Status Revised               Plan - 12/31/16 1115    Clinical Impression Statement Pt does not wish to trial and work with foot up brace at this time.  Initiated pre-gait activities in // bars for weight shifting, strengthening in WB positions, upright trunk control and postural training and decreasing dependence on UE in upright positions.  Transitioned to gait training with improved upright trunk control and gait sequence initially but as pt fatigued pt returned to flexed posture.  Pt is making slow but steady progress but did not meet any LTG; will continue to address impairments in strength, motor planning/motor control, postural control,  balance, gait, endurance to maximize functional mobility independence and decrease falls risk.   Rehab Potential Good   Clinical Impairments Affecting Rehab Potential extensive PMH   PT Frequency 2x / week   PT Duration 8 weeks   PT Treatment/Interventions ADLs/Self Care Home Management;Biofeedback;Canalith Repostioning;Electrical Stimulation;Neuromuscular re-education;Balance training;Therapeutic exercise;Manual techniques;Therapeutic activities;Functional mobility training;Gait training;DME Instruction;Orthotic Fit/Training;Patient/family education;Vestibular;Stair training;Passive range of motion;Energy conservation   PT Next Visit Plan  Pt recertified for 8 more weeks; continue to focus on upright trunk control/posture in standing-reaching, weight shifting, LE strengthening in WB, pre-gait activities, gait with UP Walker working away from second person w/c follow; work towards regular [place] and Agree with Plan of Care Patient      Patient will benefit from skilled therapeutic intervention in order to improve the following deficits and impairments:  Abnormal gait, Decreased endurance, Impaired sensation, Decreased strength, Decreased knowledge of use of DME, Decreased balance, Decreased mobility, Decreased range of motion, Decreased coordination, Impaired flexibility, Postural dysfunction, Difficulty walking  Visit Diagnosis: Other abnormalities of gait and mobility  Muscle weakness (generalized)  Other disturbances of skin sensation  Unsteadiness on feet     Problem List Patient Active Problem List   Diagnosis Date Noted  . Urinary retention   . Acute pain of right knee   . Acute lower UTI   . Weakness of right lower extremity   . Confusion   . Hydrocephalus   . Poor appetite   . Acute blood loss anemia   . Post-operative pain   . Hypokalemia   . Leukocytosis   . Thrombocytopenia ([place])   . Acute deep vein  thrombosis (DVT) of femoral vein of left lower extremity ([place])   . Myelopathy ([place]) 07/24/2016  . Thoracic myelopathy   . Depression   . Benign essential HTN   . History of subarachnoid hemorrhage   . Hypercoagulable state ([place])   . Constipation due to pain medication   . 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 Protein-calorie malnutrition ([place]) 12/21/2014  . Thyroid activity decreased 12/21/2014  . 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 Essential hypertension 12/13/2014  . Anemia, iron deficiency 12/13/2014  . Hypothyroidism 12/13/2014  . Prediabetes 12/13/2014    Raylene Everts, PT, DPT 12/31/16    11:22 AM    [facility] [address] [place], [place], [address] Phone: [phone]   Fax:  [phone]  Name: Katrina Donaldson MRN: [id number] Date of Birth: 01-17-1956

## 2016-12-31 NOTE — Therapy (Signed)
[facility] [address] [place], [place], [address] Phone: [phone]   Fax:  [phone]  Occupational Therapy Evaluation  Patient Details  Name: Katrina Donaldson MRN: [id number] Date of Birth: [birth year] Referring Provider: Dr. Jamse Arn  Encounter Date: 12/31/2016      OT End of Session - 12/31/16 1221    Visit Number 1   Number of Visits 17   Date for OT Re-Evaluation 03/01/17   Authorization Type [employer] 30% coinsurance, no visit limit/no auth   OT Start Time 1105   OT Stop Time 1145   OT Time Calculation (min) 40 min   Activity Tolerance Patient tolerated treatment well   Behavior During Therapy [facility] for tasks assessed/performed      Past Medical History:  Diagnosis Date  . Brain aneurysm   . Depression   . History of DVT (deep vein thrombosis)   . Hypercoagulable state ([place])   . Hypertension   . Hypothyroidism   . Stroke [facility]) 2016   [facility] 11/29/14    Past Surgical History:  Procedure Laterality Date  . ABDOMINAL HYSTERECTOMY     patient denies  . BRAIN SURGERY     aneurysm  . goiter    . LAMINECTOMY N/A 07/19/2016   Procedure: LAMINECTOMY THORACIC FOUR THORACIC FIVE, THORACIC SIX TO THORACIC EIGHT,THORACIC TEN TO LUMBAR ONE, FENESTRATION OF ARACHNOID CYSTS;  Surgeon: Consuella Lose, MD;  Location: [facility];  Service: Neurosurgery;  Laterality: N/A;  . THYROIDECTOMY      There were no vitals filed for this visit.      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 No/denies           [facility] OT Assessment - 12/31/16 0001      Assessment   Diagnosis throacic spondylosis with  myelopathy  spinal arachroid cyst T11-L1 +T4-T7 laminectomy   Referring Provider Dr. Jamse Arn   Onset Date --  surgery 07/2016 to remove cysts   Prior Therapy current in PT  home health therapies after hospital d/c     Precautions   Precautions Fall     Balance Screen   Has the patient fallen in the past 6 months No     Home  Environment   Family/patient expects to be discharged to: --  1st floor apartment   Adaptive equipment [place]  RW, w/c, drop arm commode   Lives With Alone  friend checks on her daily     Prior Function   Level of Independence Independent  using cane for mobility for approx 3 months prior to surgery   Vocation Retired   Leisure Dance (line dancing), go to music in the park and to movies, get together with family, gardening     ADL   Eating/Feeding Modified independent   Grooming Modified independent   Upper Body Bathing Modified independent   Lower Body Bathing Modified independent   Upper Body Dressing --  mod I   Lower Body Dressing Modified independent  in sitting, standing at sink to pull up pants, d   Lower Body Dressing Patient Percentage --  difficulty pulling up depends in the back   Toilet Transfer Modified independent  using  drop arm commode over toliet   Toileting - Clothing Manipulation Modified independent  difficulty pulling up depends in the back   Tub/Shower Transfer --  distant supervision, mostly washes at the sink   Dance movement psychotherapist     IADL   Prior Level of Function Shopping independent   Shopping --  dtr orders online and it is delivered   Prior Level of Function Light Housekeeping independent   Light Housekeeping --  from w/c level for light tasks   Prior Level of Function Meal Prep independent   Meal Prep --  performing from w/c level   Prior Level of Function Community Mobility independent with driving   Community Mobility Relies on family or friends for transportation  or  [employer], been driving some (w/ assist to get w/c in/out)     Mobility   Mobility Status Comments uses w/c in the home for mobility, uses RW to ambulate some with assistance per pt report; uses sliding board or squat pivot transfer at times  pull up to standing using sink from w/c with mod-max effort     Written Expression   Dominant Hand Right     Vision - History   Baseline Vision Wears glasses for distance only  for driving     Activity Tolerance   Activity Tolerance Comments Pt report that she can only stand for 5 min at counter with UE support      Cognition   Overall Cognitive Status Within Functional Limits for tasks assessed     Sensation   Additional Comments N/T in B toes (constant). Pt reported decr. light touch during testing on RLE (distal to knee).     Coordination   9 Hole Peg Test Right;Left   Right 9 Hole Peg Test 20.41   Left 9 Hole Peg Test 27.91     ROM / Strength   AROM / PROM / Strength AROM;Strength     AROM   Overall AROM  Deficits   Overall AROM Comments BUEs grossly WNL except L shoulder compensation patterns with movement with hx of decr ROM, achiness due to chronic arthritis (has had therapy for it in the past)     Strength   Overall Strength Deficits   Overall Strength Comments R shoulder flex/abduction 3+/5, horizontal abduction/adduction 4-/5 and biceps/triceps 5/5;  LUE shoulder flex/abduction 3/5, horizontal abduction/adduction 3+/5 and biceps/triceps 5/5  see PT notes for details about LE strength     Hand Function   Right Hand Grip (lbs) 50   Left Hand Grip (lbs) 55                         OT Education - 12/31/16 1248    Education provided Yes   Education Details Pt cautioned against driving due to decr LE movement/control/reaction time; discussed eval results and OT POC   Person(s) Educated Patient   Methods Explanation   Comprehension Verbalized understanding          OT Short Term Goals - 12/31/16 1301       OT SHORT TERM GOAL #1   Title Pt will be independent with HEP--check STGs 01/28/17   Time 4   Period Weeks   Status New     OT SHORT TERM GOAL #2   Title Pt will be able to pull up pants/adult brief in standing sufficiently without assist.   Baseline pt reports that she feels that she can't get it pulled  up enough in the back   Time 4   Period Weeks   Status New     OT SHORT TERM GOAL #3   Title Pt will be able to stand at counter for at least 8 min for functional task with 1UE support.   Time 4   Period Weeks   Status New     OT SHORT TERM GOAL #4   Title Pt will perform simple cooking tasks from w/c level demonstrating good safety awareness.   Baseline therapist to further assess safety and make recommendations prn   Time 4   Period Weeks   Status New           OT Long Term Goals - 12/31/16 1259      OT LONG TERM GOAL #1   Title Pt will be independent with adaptive strategies/AE to incr safety/ease with ADLs/IADLs prn.   Time 8   Period Weeks   Status New     OT LONG TERM GOAL #2   Title Pt will be able to stand at counter for at least 12 min for functional task with 1UE support.   Time 8   Period Weeks   Status New     OT LONG TERM GOAL #3   Title Pt will demo good safety awareness for transfers during ADLs (shower, toilet).   Baseline therapist to further assess safety and make recommendations   Time 8   Period Weeks   Status New               Plan - 12/31/16 1244    Clinical Impression Statement Pt presents today with decr strength, decr functional mobility, decr balance, decr activity tolerance, and decr LE sensation.  Pt would benefit from occupational therapy to improve ADL/IADL safety/ease, functional mobility for ADLs/IADLs and incr strength.    Occupational Profile and client history currently impacting functional performance Pt is a 61 y.o. 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  Pt was  independent and living alone in house prior to surgery and walking with a cane.  However, now pt has moved into handicapped assessible apartment and is using w/c for mobility.     Occupational performance deficits (Please refer to evaluation for details): ADL's;IADL's;Social Participation;Leisure   Rehab Potential Fair   Current Impairments/barriers affecting progress: severity of deficits   OT Frequency 2x / week   OT Duration 8 weeks  +eval   OT Treatment/Interventions Self-care/ADL training;Therapeutic exercise;DME and/or AE instruction;Therapist, nutritional;Therapeutic activities;Patient/family education;Balance training;Manual Therapy;Neuromuscular education;Cryotherapy;Moist Heat;Energy conservation;Passive range of motion;Therapeutic exercises   Plan UE HEP, functional transfers, standing with functional task   Clinical Decision Making Several treatment options, min-mod task modification necessary   Consulted and Agree with Plan of Care Patient      Patient will benefit from skilled therapeutic intervention in order to improve the following deficits and impairments:  Decreased activity tolerance, Decreased knowledge of use of DME, Decreased balance, Decreased endurance, Decreased mobility, Difficulty walking, Impaired sensation, Decreased range of motion, Decreased strength  Visit Diagnosis: Muscle weakness (generalized)  Other abnormalities of gait and mobility  Other disturbances of skin sensation  Unsteadiness on feet    Problem List Patient Active Problem List   Diagnosis Date Noted  . Urinary retention   . Acute pain of right knee   . Acute lower UTI   . Weakness of right lower extremity   . Confusion   . Hydrocephalus   .  Poor appetite   . Acute blood loss anemia   . Post-operative pain   . Hypokalemia   . Leukocytosis   . Thrombocytopenia ([place])   . Acute deep vein thrombosis (DVT) of femoral vein of left lower extremity ([place])   . Myelopathy ([place])  07/24/2016  . Thoracic myelopathy   . Depression   . Benign essential HTN   . History of subarachnoid hemorrhage   . Hypercoagulable state ([place])   . Constipation due to pain medication   . 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 Protein-calorie malnutrition ([place]) 12/21/2014  . Thyroid activity decreased 12/21/2014  . 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 Essential hypertension 12/13/2014  . Anemia, iron deficiency 12/13/2014  . Hypothyroidism 12/13/2014  . Prediabetes 12/13/2014    [facility] 12/31/2016, 1:12 PM  [place] [facility] [address] [place], [place], [address] Phone: [phone]   Fax:  [phone]  Name: Katrina Donaldson MRN: [id number] Date of Birth: 02-27-1956   Vianne Bulls, OTR/L [facility] [address] [place], [place]  [address] [phone] phone [phone] 12/31/16 1:12 PM

## 2017-01-10 ENCOUNTER — Ambulatory Visit: Payer: BC Managed Care – PPO

## 2017-01-10 DIAGNOSIS — R2689 Other abnormalities of gait and mobility: Secondary | ICD-10-CM | POA: Diagnosis not present

## 2017-01-10 NOTE — Therapy (Signed)
[facility] [address] [place], [place], [address] Phone: [phone]   Fax:  [phone]  Patient Details  Name: Katrina Donaldson MRN: [id number] Date of Birth: Jul 30, 1955 Referring Provider:  No ref. provider found  Encounter Date: 01/26/17  PHYSICAL THERAPY DISCHARGE SUMMARY  Visits from Start of Care: 13  Current functional level related to goals / functional outcomes:     PT Short Term Goals - 12/31/16 1123      PT SHORT TERM GOAL #1   Title NEW TARGET DATE FOR ALL STGs: 01/30/2017: Pt will be IND with HEP to improve strength, endurance, balance, and flexibility.    Time 4   Period Weeks   Status Revised     PT SHORT TERM GOAL #2   Title Pt will perform sit to stand and stand pivot transfers with RW with min A    Baseline Mod A   Time 4   Period Weeks   Status Revised     PT SHORT TERM GOAL #3   Title Pt will amb. 115' with RW with min A over level, indoor surfaces.   Baseline min-mod with Up [place] Rile, 115'   Time 4   Period Weeks   Status Revised     PT SHORT TERM GOAL #4   Title Improve TUG to <3 minutes with RW and min A   Time 4   Period Weeks   Status Revised         PT Long Term Goals - 12/31/16 1124      PT LONG TERM GOAL #1   Title NEW TARGET DATE FOR ALL LTG 03/01/2017: Pt will ambulate x >250' over level indoor surfaces with RW with supervision   Time 8   Period Weeks   Status Revised     PT LONG TERM GOAL #2   Title Pt will perform sit <> stand and stand pivot transfers Mod I with RW    Time 8   Period Weeks   Status Revised     PT LONG TERM GOAL #3   Title Pt will demonstrate ability to release RW and maintain upright trunk posture to safely reach for items outside BOS with supervision x 8 reps   Time 8   Period Weeks   Status Revised     PT LONG TERM GOAL #4   Title Pt will verbalize fall prevention strategies to reduce falls risk.    Time 8   Period Weeks   Status  On-going     PT LONG TERM GOAL #5   Title Pt will improve safety with gait as indicated by TUG decreased to <2.5 minutes with min A.   Baseline 3:26 with mod A with RW   Time 8   Period Weeks   Status Revised        Remaining deficits: Unknown, as pt did not return as she is now receiving HHPT.   Education / Equipment: HEP  Plan: Patient agrees to discharge.  Patient goals were not met. Patient is being discharged due to a change in medical status.  ?????       Miller,Jennifer L 01/26/2017, 11:29 AM      G-Codes - 01/26/2017 1129    Functional Assessment Tool Used (Outpatient Only) TUG: 3:26 seconds, mod A for transfers and gait 15' with RW (from 10th visit, as pt did not return)   Functional Limitation Mobility: Walking and moving around   Mobility: Walking and Moving Around Goal Status (  [id number]) At least 20 percent but less than 40 percent impaired, limited or restricted   Mobility: Walking and Moving Around Discharge Status [phone]) At least 60 percent but less than 80 percent impaired, limited or restricted       [facility] [address] [place], [place], [address] Phone: [phone]   Fax:  [phone]   Geoffry Paradise, PT,DPT 01/10/17 11:30 AM Phone: [phone] Fax: [phone]

## 2017-01-14 ENCOUNTER — Encounter: Payer: BC Managed Care – PPO | Admitting: Occupational Therapy

## 2017-01-14 ENCOUNTER — Ambulatory Visit: Payer: BC Managed Care – PPO

## 2017-01-15 ENCOUNTER — Encounter: Payer: BC Managed Care – PPO | Admitting: Occupational Therapy

## 2017-01-15 ENCOUNTER — Ambulatory Visit: Payer: BC Managed Care – PPO

## 2017-01-22 ENCOUNTER — Encounter: Payer: BC Managed Care – PPO | Admitting: Occupational Therapy

## 2017-01-22 ENCOUNTER — Ambulatory Visit: Payer: BC Managed Care – PPO | Admitting: Physical Therapy

## 2017-01-24 ENCOUNTER — Encounter: Payer: BC Managed Care – PPO | Admitting: Occupational Therapy

## 2017-01-28 ENCOUNTER — Ambulatory Visit: Payer: BC Managed Care – PPO

## 2017-01-28 ENCOUNTER — Encounter: Payer: BC Managed Care – PPO | Admitting: Occupational Therapy

## 2017-01-30 ENCOUNTER — Encounter: Payer: BC Managed Care – PPO | Admitting: Occupational Therapy

## 2017-01-30 ENCOUNTER — Ambulatory Visit: Payer: BC Managed Care – PPO | Admitting: Physical Therapy

## 2017-02-03 ENCOUNTER — Ambulatory Visit: Payer: BC Managed Care – PPO | Admitting: Physical Therapy

## 2017-02-04 ENCOUNTER — Encounter: Payer: BC Managed Care – PPO | Admitting: Occupational Therapy

## 2017-02-04 ENCOUNTER — Ambulatory Visit: Payer: BC Managed Care – PPO | Admitting: Physical Therapy

## 2017-02-06 ENCOUNTER — Ambulatory Visit: Payer: BC Managed Care – PPO | Attending: Physical Medicine & Rehabilitation | Admitting: Physical Therapy

## 2017-02-06 ENCOUNTER — Encounter: Payer: Self-pay | Admitting: Physical Therapy

## 2017-02-06 DIAGNOSIS — F331 Major depressive disorder, recurrent, moderate: Secondary | ICD-10-CM | POA: Diagnosis present

## 2017-02-06 DIAGNOSIS — R262 Difficulty in walking, not elsewhere classified: Secondary | ICD-10-CM | POA: Diagnosis present

## 2017-02-06 DIAGNOSIS — I609 Nontraumatic subarachnoid hemorrhage, unspecified: Secondary | ICD-10-CM | POA: Diagnosis present

## 2017-02-06 DIAGNOSIS — G959 Disease of spinal cord, unspecified: Secondary | ICD-10-CM | POA: Insufficient documentation

## 2017-02-06 DIAGNOSIS — R2689 Other abnormalities of gait and mobility: Secondary | ICD-10-CM | POA: Insufficient documentation

## 2017-02-06 DIAGNOSIS — M4714 Other spondylosis with myelopathy, thoracic region: Secondary | ICD-10-CM | POA: Insufficient documentation

## 2017-02-06 DIAGNOSIS — F09 Unspecified mental disorder due to known physiological condition: Secondary | ICD-10-CM | POA: Insufficient documentation

## 2017-02-06 DIAGNOSIS — G9619 Other disorders of meninges, not elsewhere classified: Secondary | ICD-10-CM | POA: Diagnosis present

## 2017-02-06 DIAGNOSIS — R2681 Unsteadiness on feet: Secondary | ICD-10-CM

## 2017-02-06 DIAGNOSIS — R208 Other disturbances of skin sensation: Secondary | ICD-10-CM | POA: Insufficient documentation

## 2017-02-06 DIAGNOSIS — R410 Disorientation, unspecified: Secondary | ICD-10-CM | POA: Insufficient documentation

## 2017-02-06 DIAGNOSIS — M6281 Muscle weakness (generalized): Secondary | ICD-10-CM | POA: Diagnosis present

## 2017-02-06 NOTE — Therapy (Signed)
[place], [place], [address] Phone: [phone]   Fax:  [phone]  Physical Therapy Evaluation  Patient Details  Name: Katrina Donaldson MRN: [id number] Date of Birth: Nov 19, 1955 Referring Provider: Dr. Posey Pronto  Encounter Date: 02/06/2017      PT End of Session - 02/06/17 0949    Visit Number 1   Date for PT Re-Evaluation 04/08/17   Authorization Type [employer]BS State, [place] code and PN every 10th visit   PT Start Time 0823   PT Stop Time 0930   PT Time Calculation (min) 67 min   Equipment Utilized During Treatment Gait belt   Activity Tolerance Patient tolerated treatment well   Behavior During Therapy [facility] for tasks assessed/performed      Past Medical History:  Diagnosis Date  . Brain aneurysm   . Depression   . History of DVT (deep vein thrombosis)   . Hypercoagulable state ([place])   . Hypertension   . Hypothyroidism   . Stroke [facility]) 2016   [facility] 11/29/14    Past Surgical History:  Procedure Laterality Date  . ABDOMINAL HYSTERECTOMY     patient denies  . BRAIN SURGERY     aneurysm  . goiter    . LAMINECTOMY N/A 07/19/2016   Procedure: LAMINECTOMY THORACIC FOUR THORACIC FIVE, THORACIC SIX TO THORACIC EIGHT,THORACIC TEN TO LUMBAR ONE, FENESTRATION OF ARACHNOID CYSTS;  Surgeon: Consuella Lose, MD;  Location: [place];  Service: Neurosurgery;  Laterality: N/A;  . THYROIDECTOMY      There were no vitals filed for this visit.       Subjective Assessment - 02/06/17 0833    Subjective Pt presenting with thoracic myelopathy s/p surgery for spinal arachnoid cyst (T11-L1) and T4-T7 laminectomy on 07/19/16 and received inpt rehab and HHPT. Patient reports that she had HHPT then went to outpatient Neuro PT from May to July, she reports htat she was not happy with that center and her ability to walk so she stopped there in July and went to [facility] and they "really walked me, which is what I want to  do" and she feels weak and cannot walk as much as she wants. Pt is IND with bathing, dressing, cooking and eating. Pt reports no issues with bed mobility.   She reports that she is just frustrated with her progress and wants to get better and walk   Pertinent History SAH, aneurym, HTN, DVT R iliac, hypothyroidism, R hydrocephalus, hypercoagulable state   Patient Stated Goals "I want to be walking again without support, I want to be back to where I was."   Currently in Pain? No/denies            [facility] PT Assessment - 02/06/17 0001      Assessment   Medical Diagnosis Thoracic myelopathy   Referring Provider Dr. Posey Pronto   Onset Date/Surgical Date 07/19/16   Hand Dominance Right   Prior Therapy inpatient, HHPT, outpatient neuro and then to [place] again     Precautions   Precautions Fall     Balance Screen   Has the patient fallen in the past 6 months No   Has the patient had a decrease in activity level because of a fear of falling?  Yes   Is the patient reluctant to leave their home because of a fear of falling?  Yes     [place] residence   Living Arrangements Alone   Available  Help at Discharge Family;Friend(s)   Type of Home Apartment   Home Access Level entry   Home Layout One level   Home Equipment Wheelchair - manual;Walker - 2 wheels;Cane - single point;Bedside commode;Tub bench   Additional Comments does her own cooking and cleaning     Prior Function   Level of Independence Independent   Vocation Retired   [employer] reports that prior to surgery she was living in a home with stairs prior to surgery and has had to move secondary to her condition, was having to use a cane for about 3 motnhs prior to surgery   Leisure Dance (line dancing), go to music in the park and to movies, get together with family, gardening     Sensation   Additional Comments N/T in B toes (constant). Pt reported decr. light touch during testing on RLE  (distal to knee).     Posture/Postural Control   Posture Comments fwd head, slouched lumbar posture     ROM / Strength   AROM / PROM / Strength AROM;Strength     AROM   Overall AROM Comments Left shoulder ROM is limited due to "arthritis" IR 10 degrees, flexion 100 degrees,  Hip flexion to 50 degrees in standing, abduction 5 degrees, ER of the hips 5 degrees, c/o tightness and weakness     Strength   Overall Strength Comments shoulders 3+/5, elbows 4+/5   Strength Assessment Site Hip;Knee;Ankle   Right/Left Hip Right;Left   Right Hip Flexion 3+/5   Right Hip Extension 3-/5   Right Hip External Rotation  3-/5   Left Hip Flexion 3+/5   Left Hip Extension 3-/5   Left Hip External Rotation 3-/5   Right/Left Knee Right;Left   Right Knee Flexion 1/5   Right Knee Extension 3+/5   Left Knee Flexion 3-/5   Left Knee Extension 3+/5   Right/Left Ankle Right;Left   Right Ankle Dorsiflexion 3+/5   Left Ankle Dorsiflexion 3+/5     Transfers   Comments a lot of difficulty getting up from sitting, uses the hands to position the right LE, multiple attempts to get up, required mod A, reports uses a sliding board mostly at home     Ambulation/Gait   Ambulation/Gait Yes   Ambulation/Gait Assistance 4: Min assist;3: Mod assist   Ambulation Distance (Feet) 20 Feet   Assistive device Rolling walker   Gait Comments right knee goes into extension with stance phase, very slow, a lot of weight on the UE's     Timed Up and Go Test   TUG Normal TUG   Normal TUG (seconds) 227   TUG Comments RW, min A for balance     RLE Tone   RLE Tone Within Functional Limits     LLE Tone   LLE Tone Within Functional Limits            Objective measurements completed on examination: See above findings.          [facility] Adult PT Treatment/Exercise - 02/06/17 0001      Knee/Hip Exercises: Aerobic   Nustep level 4 x 6 minutes arms and legs and then 3 minutes with legs only     Knee/Hip Exercises:  Seated   Hamstring Curl AROM;Strengthening;Both;1 set;10 reps;Limitations   Hamstring Limitations yellow band with right leg, red band with left leg. cues on technique and slow, controlled movements                  PT Short  Term Goals - 12/31/16 1123      PT SHORT TERM GOAL #1   Title NEW TARGET DATE FOR ALL STGs: 01/30/2017: Pt will be IND with HEP to improve strength, endurance, balance, and flexibility.    Time 4   Period Weeks   Status Revised     PT SHORT TERM GOAL #2   Title Pt will perform sit to stand and stand pivot transfers with RW with min A    Baseline Mod A   Time 4   Period Weeks   Status Revised     PT SHORT TERM GOAL #3   Title Pt will amb. 115' with RW with min A over level, indoor surfaces.   Baseline min-mod with Up [place] Rile, 115'   Time 4   Period Weeks   Status Revised     PT SHORT TERM GOAL #4   Title Improve TUG to <3 minutes with RW and min A   Time 4   Period Weeks   Status Revised           PT Long Term Goals - 02/06/17 1016      PT LONG TERM GOAL #1   Title Pt will ambulate x >250' over level indoor surfaces with RW with supervision   Time 8   Period Weeks   Status New     PT LONG TERM GOAL #2   Title Pt will perform sit <> stand and stand pivot transfers CGA with RW    Time 8   Period Weeks   Status New     PT LONG TERM GOAL #3   Title Pt will demonstrate ability to release RW and maintain upright trunk posture to safely reach for items outside BOS with supervision x 8 reps   Time 8   Period Weeks   Status New     PT LONG TERM GOAL #4   Title Pt will verbalize fall prevention strategies to reduce falls risk.    Time 8   Period Weeks   Status New     PT LONG TERM GOAL #5   Title Pt will improve safety with gait as indicated by TUG decreased to 150 seconds with CGA   Baseline 3:26 with mod A with RW   Time 8   Period Weeks   Status New                Plan - 02/06/17 0953    Clinical Impression  Statement Patient has a long history of recovery since a surgery on her spine due to cyst back in January.  She report that her PLOF was independent walking and living in a home with stairs, since the surgery she had to move to an apartment, all of her household locomotion is with a w/c, the right LE is very weak, she has a great deal of doing a stand pivot transfer, all transfers at home are with a sliding board.  Her TUG time was 227 seconds, right LE is very weak and she tends to extned the knee fully to lock it due to weakness.  Her ultimate goal is to walk and be mobile at home without W/C.   Clinical Presentation Evolving   Clinical Presentation due to: significant deficits and a long history since surgery with issues with recovery   Clinical Decision Making Moderate   Rehab Potential Fair   Clinical Impairments Affecting Rehab Potential extensive PMH   PT Frequency 2x / week   PT Duration  8 weeks   PT Treatment/Interventions ADLs/Self Care Home Management;Biofeedback;Canalith Repostioning;Electrical Stimulation;Neuromuscular re-education;Balance training;Therapeutic exercise;Manual techniques;Therapeutic activities;Functional mobility training;Gait training;DME Instruction;Orthotic Fit/Training;Patient/family education;Vestibular;Stair training;Passive range of motion;Energy conservation   PT Next Visit Plan Get patient moving, see if we can build some LE strength, endurance and balance for her to be functional   Consulted and Agree with Plan of Care Patient      Patient will benefit from skilled therapeutic intervention in order to improve the following deficits and impairments:  Abnormal gait, Decreased endurance, Impaired sensation, Decreased strength, Decreased knowledge of use of DME, Decreased balance, Decreased mobility, Decreased range of motion, Decreased coordination, Impaired flexibility, Postural dysfunction, Difficulty walking  Visit Diagnosis: Muscle weakness (generalized) -  Plan: PT plan of care cert/re-cert  Difficulty in walking, not elsewhere classified - Plan: PT plan of care cert/re-cert  Other abnormalities of gait and mobility - Plan: PT plan of care cert/re-cert  Unsteadiness on feet - Plan: PT plan of care cert/re-cert      G-Codes - 15/83/09 1021    Functional Assessment Tool Used (Outpatient Only) foto 88% limitation   Functional Limitation Mobility: Walking and moving around   Mobility: Walking and Moving Around Current Status ([id number]) At least 80 percent but less than 100 percent impaired, limited or restricted   Mobility: Walking and Moving Around Goal Status [phone]) At least 60 percent but less than 80 percent impaired, limited or restricted       Problem List Patient Active Problem List   Diagnosis Date Noted  . Urinary retention   . Acute pain of right knee   . Acute lower UTI   . Weakness of right lower extremity   . Confusion   . Hydrocephalus   . Poor appetite   . Acute blood loss anemia   . Post-operative pain   . Hypokalemia   . Leukocytosis   . Thrombocytopenia ([place])   . Acute deep vein thrombosis (DVT) of femoral vein of left lower extremity ([place])   . Myelopathy ([place]) 07/24/2016  . Thoracic myelopathy   . Depression   . Benign essential HTN   . History of subarachnoid hemorrhage   . Hypercoagulable state ([place])   . Constipation due to pain medication   . 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 Protein-calorie malnutrition ([place]) 12/21/2014  . Thyroid activity decreased 12/21/2014  . 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 Essential hypertension 12/13/2014  . Anemia, iron deficiency 12/13/2014  . Hypothyroidism 12/13/2014  . Prediabetes 12/13/2014    Sumner Boast., PT 02/06/2017, 10:23 AM  [place] [address] [place], [place], [address] Phone: [phone]   Fax:  [phone]  Name: Shani Fitch MRN: [id number] Date of Birth: 1956-03-01

## 2017-02-07 ENCOUNTER — Encounter: Payer: BC Managed Care – PPO | Admitting: Occupational Therapy

## 2017-02-07 ENCOUNTER — Ambulatory Visit: Payer: BC Managed Care – PPO

## 2017-02-10 ENCOUNTER — Ambulatory Visit: Payer: BC Managed Care – PPO | Admitting: Physical Therapy

## 2017-02-11 ENCOUNTER — Ambulatory Visit: Payer: BC Managed Care – PPO | Admitting: Physical Therapy

## 2017-02-11 ENCOUNTER — Encounter: Payer: Self-pay | Admitting: Physical Therapy

## 2017-02-11 ENCOUNTER — Ambulatory Visit: Payer: BC Managed Care – PPO

## 2017-02-11 ENCOUNTER — Encounter: Payer: BC Managed Care – PPO | Admitting: Occupational Therapy

## 2017-02-11 DIAGNOSIS — M6281 Muscle weakness (generalized): Secondary | ICD-10-CM | POA: Diagnosis not present

## 2017-02-11 DIAGNOSIS — R2681 Unsteadiness on feet: Secondary | ICD-10-CM

## 2017-02-11 DIAGNOSIS — R262 Difficulty in walking, not elsewhere classified: Secondary | ICD-10-CM

## 2017-02-11 NOTE — Therapy (Signed)
[place], [place], [address] Phone: [phone]   Fax:  [phone]  Physical Therapy Treatment  Patient Details  Name: Katrina Donaldson MRN: [id number] Date of Birth: [birth year] Referring Provider: Dr. Posey Pronto  Encounter Date: 02/11/2017      PT End of Session - 02/11/17 1236    Visit Number 2   Number of Visits 30   Date for PT Re-Evaluation 04/08/17   PT Start Time 1240   PT Stop Time 1335   PT Time Calculation (min) 55 min      Past Medical History:  Diagnosis Date  . Brain aneurysm   . Depression   . History of DVT (deep vein thrombosis)   . Hypercoagulable state ([place])   . Hypertension   . Hypothyroidism   . Stroke [facility]) 2016   [facility] 11/29/14    Past Surgical History:  Procedure Laterality Date  . ABDOMINAL HYSTERECTOMY     patient denies  . BRAIN SURGERY     aneurysm  . goiter    . LAMINECTOMY N/A 07/19/2016   Procedure: LAMINECTOMY THORACIC FOUR THORACIC FIVE, THORACIC SIX TO THORACIC EIGHT,THORACIC TEN TO LUMBAR ONE, FENESTRATION OF ARACHNOID CYSTS;  Surgeon: Consuella Lose, MD;  Location: [facility];  Service: Neurosurgery;  Laterality: N/A;  . THYROIDECTOMY      There were no vitals filed for this visit.      Subjective Assessment - 02/11/17 1157    Subjective pain in shld last night. rushing this morning   Currently in Pain? No/denies                         [facility] Adult PT Treatment/Exercise - 02/11/17 0001      Transfers   Comments max A to get off mat     Ambulation/Gait   Gait Comments amb with RW 15 feet, 10 feet 15 feet with RW mod A , RT knee buckles     Knee/Hip Exercises: Aerobic   Nustep level 4 x 6 minutes arms and legs  add'1 2 min legs only     Knee/Hip Exercises: Standing   Forward Step Up Left;5 reps;Hand Hold: 2;Step Height: 2"  unable to due RT     Knee/Hip Exercises: Seated   Ball Squeeze 10   Clamshell with TheraBand  Yellow   Hamstring Curl AROM;Strengthening;Both;1 set;10 reps;Limitations   Hamstring Limitations yellow band. assistance on RT   Sit to Sand 10 reps;with UE support  HHA mod A                  PT Short Term Goals - 12/31/16 1123      PT SHORT TERM GOAL #1   Title NEW TARGET DATE FOR ALL STGs: 01/30/2017: Pt will be IND with HEP to improve strength, endurance, balance, and flexibility.    Time 4   Period Weeks   Status Revised     PT SHORT TERM GOAL #2   Title Pt will perform sit to stand and stand pivot transfers with RW with min A    Baseline Mod A   Time 4   Period Weeks   Status Revised     PT SHORT TERM GOAL #3   Title Pt will amb. 115' with RW with min A over level, indoor surfaces.   Baseline min-mod with Up [place] Rile, 115'   Time 4   Period Weeks   Status Revised  PT SHORT TERM GOAL #4   Title Improve TUG to <3 minutes with RW and min A   Time 4   Period Weeks   Status Revised           PT Long Term Goals - 02/06/17 1016      PT LONG TERM GOAL #1   Title Pt will ambulate x >250' over level indoor surfaces with RW with supervision   Time 8   Period Weeks   Status New     PT LONG TERM GOAL #2   Title Pt will perform sit <> stand and stand pivot transfers CGA with RW    Time 8   Period Weeks   Status New     PT LONG TERM GOAL #3   Title Pt will demonstrate ability to release RW and maintain upright trunk posture to safely reach for items outside BOS with supervision x 8 reps   Time 8   Period Weeks   Status New     PT LONG TERM GOAL #4   Title Pt will verbalize fall prevention strategies to reduce falls risk.    Time 8   Period Weeks   Status New     PT LONG TERM GOAL #5   Title Pt will improve safety with gait as indicated by TUG decreased to 150 seconds with CGA   Baseline 3:26 with mod A with RW   Time 8   Period Weeks   Status New               Plan - 02/11/17 1236    Clinical Impression Statement fatigued at end of  session. mod A with gait and as fatigued decreased RT leg advancemnet and buckling.    PT Next Visit Plan Get patient moving, see if we can build some LE strength, endurance and balance for her to be functional      Patient will benefit from skilled therapeutic intervention in order to improve the following deficits and impairments:  Abnormal gait, Decreased endurance, Impaired sensation, Decreased strength, Decreased knowledge of use of DME, Decreased balance, Decreased mobility, Decreased range of motion, Decreased coordination, Impaired flexibility, Postural dysfunction, Difficulty walking  Visit Diagnosis: Muscle weakness (generalized)  Difficulty in walking, not elsewhere classified  Unsteadiness on feet     Problem List Patient Active Problem List   Diagnosis Date Noted  . Urinary retention   . Acute pain of right knee   . Acute lower UTI   . Weakness of right lower extremity   . Confusion   . Hydrocephalus   . Poor appetite   . Acute blood loss anemia   . Post-operative pain   . Hypokalemia   . Leukocytosis   . Thrombocytopenia ([place])   . Acute deep vein thrombosis (DVT) of femoral vein of left lower extremity ([place])   . Myelopathy ([place]) 07/24/2016  . Thoracic myelopathy   . Depression   . Benign essential HTN   . History of subarachnoid hemorrhage   . Hypercoagulable state ([place])   . Constipation due to pain medication   . 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 Protein-calorie malnutrition ([place]) 12/21/2014  . Thyroid activity decreased 12/21/2014  . 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 Essential hypertension 12/13/2014  . Anemia, iron deficiency 12/13/2014  . Hypothyroidism 12/13/2014  . Prediabetes 12/13/2014    Israella Hubert,ANGIE PTA 02/11/2017, 12:38 PM  [place] [phone]  [place], [place], [address] Phone: [phone]   Fax:  [phone]  Name:  Asenath Balash MRN: [id number] Date of Birth: 1955-09-22

## 2017-02-12 ENCOUNTER — Encounter: Payer: BC Managed Care – PPO | Admitting: Occupational Therapy

## 2017-02-12 ENCOUNTER — Ambulatory Visit: Payer: BC Managed Care – PPO | Admitting: Physical Therapy

## 2017-02-13 ENCOUNTER — Encounter: Payer: Self-pay | Admitting: Physical Therapy

## 2017-02-13 ENCOUNTER — Ambulatory Visit: Payer: BC Managed Care – PPO | Admitting: Physical Therapy

## 2017-02-13 DIAGNOSIS — M6281 Muscle weakness (generalized): Secondary | ICD-10-CM | POA: Diagnosis not present

## 2017-02-13 DIAGNOSIS — R2681 Unsteadiness on feet: Secondary | ICD-10-CM

## 2017-02-13 DIAGNOSIS — R262 Difficulty in walking, not elsewhere classified: Secondary | ICD-10-CM

## 2017-02-13 NOTE — Therapy (Signed)
[place], [place], [address] Phone: [phone]   Fax:  [phone]  Physical Therapy Treatment  Patient Details  Name: Katrina Donaldson MRN: [id number] Date of Birth: 01-14-1956 Referring Provider: Dr. Posey Pronto  Encounter Date: 02/13/2017      PT End of Session - 02/13/17 1152    Visit Number 3   Number of Visits 30   Date for PT Re-Evaluation 04/08/17   PT Start Time 6962   PT Stop Time 1240   PT Time Calculation (min) 55 min      Past Medical History:  Diagnosis Date  . Brain aneurysm   . Depression   . History of DVT (deep vein thrombosis)   . Hypercoagulable state ([place])   . Hypertension   . Hypothyroidism   . Stroke [facility]) 2016   [facility] 11/29/14    Past Surgical History:  Procedure Laterality Date  . ABDOMINAL HYSTERECTOMY     patient denies  . BRAIN SURGERY     aneurysm  . goiter    . LAMINECTOMY N/A 07/19/2016   Procedure: LAMINECTOMY THORACIC FOUR THORACIC FIVE, THORACIC SIX TO THORACIC EIGHT,THORACIC TEN TO LUMBAR ONE, FENESTRATION OF ARACHNOID CYSTS;  Surgeon: Consuella Lose, MD;  Location: [facility];  Service: Neurosurgery;  Laterality: N/A;  . THYROIDECTOMY      There were no vitals filed for this visit.      Subjective Assessment - 02/13/17 1151    Subjective had to take a nap after last session so worn out   Currently in Pain? No/denies                         [facility] Adult PT Treatment/Exercise - 02/13/17 0001      Ambulation/Gait   Gait Comments amb with RW 15 feet, 20 feet 28 feet with RW min A NO buckling     Knee/Hip Exercises: Aerobic   Nustep L 4 8 min LE only  tband wrapped around legs to prevent falling apart     Knee/Hip Exercises: Standing   Hip Flexion Stengthening;Both;5 reps;Knee straight  with RW   Hip Abduction Stengthening;5 reps;Knee straight;Left  with RW     Knee/Hip Exercises: Seated   Ball Squeeze 20   Clamshell with  TheraBand Red   Other Seated Knee/Hip Exercises wt ball trunk ex   Sit to Sand with UE support  mod A 2 sets 7                  PT Short Term Goals - 02/13/17 1153      PT SHORT TERM GOAL #1   Title NEW TARGET DATE FOR ALL STGs: 01/30/2017: Pt will be IND with HEP to improve strength, endurance, balance, and flexibility.    Status Partially Met     PT SHORT TERM GOAL #2   Title Pt will perform sit to stand and stand pivot transfers with RW with min A    Baseline not consistant and depends on surface height   Status Partially Met     PT SHORT TERM GOAL #3   Title Pt will amb. 115' with RW with min A over level, indoor surfaces.   Status On-going     PT SHORT TERM GOAL #4   Title Improve TUG to <3 minutes with RW and min A   Status On-going           PT Long Term Goals -  02/06/17 1016      PT LONG TERM GOAL #1   Title Pt will ambulate x >250' over level indoor surfaces with RW with supervision   Time 8   Period Weeks   Status New     PT LONG TERM GOAL #2   Title Pt will perform sit <> stand and stand pivot transfers CGA with RW    Time 8   Period Weeks   Status New     PT LONG TERM GOAL #3   Title Pt will demonstrate ability to release RW and maintain upright trunk posture to safely reach for items outside BOS with supervision x 8 reps   Time 8   Period Weeks   Status New     PT LONG TERM GOAL #4   Title Pt will verbalize fall prevention strategies to reduce falls risk.    Time 8   Period Weeks   Status New     PT LONG TERM GOAL #5   Title Pt will improve safety with gait as indicated by TUG decreased to 150 seconds with CGA   Baseline 3:26 with mod A with RW   Time 8   Period Weeks   Status New               Plan - 02/13/17 1155    Clinical Impression Statement progressing with STGs. Transfer vary and are inconsistant and gait is limited to 15-20 feet . Improved gait without buckling.    PT Treatment/Interventions ADLs/Self Care Home  Management;Biofeedback;Canalith Repostioning;Electrical Stimulation;Neuromuscular re-education;Balance training;Therapeutic exercise;Manual techniques;Therapeutic activities;Functional mobility training;Gait training;DME Instruction;Orthotic Fit/Training;Patient/family education;Vestibular;Stair training;Passive range of motion;Energy conservation   PT Next Visit Plan LE strength/gait /transfers      Patient will benefit from skilled therapeutic intervention in order to improve the following deficits and impairments:  Abnormal gait, Decreased endurance, Impaired sensation, Decreased strength, Decreased knowledge of use of DME, Decreased balance, Decreased mobility, Decreased range of motion, Decreased coordination, Impaired flexibility, Postural dysfunction, Difficulty walking  Visit Diagnosis: Muscle weakness (generalized)  Difficulty in walking, not elsewhere classified  Unsteadiness on feet     Problem List Patient Active Problem List   Diagnosis Date Noted  . Urinary retention   . Acute pain of right knee   . Acute lower UTI   . Weakness of right lower extremity   . Confusion   . Hydrocephalus   . Poor appetite   . Acute blood loss anemia   . Post-operative pain   . Hypokalemia   . Leukocytosis   . Thrombocytopenia ([place])   . Acute deep vein thrombosis (DVT) of femoral vein of left lower extremity ([place])   . Myelopathy ([place]) 07/24/2016  . Thoracic myelopathy   . Depression   . Benign essential HTN   . History of subarachnoid hemorrhage   . Hypercoagulable state ([place])   . Constipation due to pain medication   . 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 Protein-calorie malnutrition ([place]) 12/21/2014  . Thyroid activity decreased 12/21/2014  . 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 Essential hypertension 12/13/2014  . Anemia, iron deficiency 12/13/2014  . Hypothyroidism 12/13/2014  . Prediabetes 12/13/2014    Kseniya Grunden,ANGIE  PTA 02/13/2017, 2:07 PM  [place] [address] [place], [place], [address] Phone: [phone]   Fax:  [phone]  Name: Katrina Donaldson MRN: [id number] Date of Birth: 1956/03/27

## 2017-02-18 ENCOUNTER — Ambulatory Visit (INDEPENDENT_AMBULATORY_CARE_PROVIDER_SITE_OTHER): Payer: BC Managed Care – PPO | Admitting: Oncology

## 2017-02-18 ENCOUNTER — Encounter: Payer: Self-pay | Admitting: Oncology

## 2017-02-18 VITALS — BP 117/69 | HR 56 | Temp 97.9°F | Ht 65.0 in

## 2017-02-18 DIAGNOSIS — Z8679 Personal history of other diseases of the circulatory system: Secondary | ICD-10-CM | POA: Diagnosis not present

## 2017-02-18 DIAGNOSIS — I82412 Acute embolism and thrombosis of left femoral vein: Secondary | ICD-10-CM

## 2017-02-18 DIAGNOSIS — D6859 Other primary thrombophilia: Secondary | ICD-10-CM

## 2017-02-18 DIAGNOSIS — D508 Other iron deficiency anemias: Secondary | ICD-10-CM

## 2017-02-18 LAB — CBC WITH DIFFERENTIAL/PLATELET
BASOS ABS: 0 10*3/uL (ref 0.0–0.1)
Basophils Relative: 0 %
Eosinophils Absolute: 0.1 10*3/uL (ref 0.0–0.7)
Eosinophils Relative: 2 %
HEMATOCRIT: 37.1 % (ref 36.0–46.0)
HEMOGLOBIN: 12.3 g/dL (ref 12.0–15.0)
LYMPHS PCT: 33 %
Lymphs Abs: 1.9 10*3/uL (ref 0.7–4.0)
MCH: 30.2 pg (ref 26.0–34.0)
MCHC: 33.2 g/dL (ref 30.0–36.0)
MCV: 91.2 fL (ref 78.0–100.0)
Monocytes Absolute: 0.5 10*3/uL (ref 0.1–1.0)
Monocytes Relative: 9 %
NEUTROS ABS: 3.2 10*3/uL (ref 1.7–7.7)
Neutrophils Relative %: 56 %
Platelets: 239 10*3/uL (ref 150–400)
RBC: 4.07 MIL/uL (ref 3.87–5.11)
RDW: 15 % (ref 11.5–15.5)
WBC: 5.7 10*3/uL (ref 4.0–10.5)

## 2017-02-18 LAB — D-DIMER, QUANTITATIVE: D-Dimer, Quant: 0.91 ug/mL-FEU — ABNORMAL HIGH (ref 0.00–0.50)

## 2017-02-18 LAB — FERRITIN: Ferritin: 118 ng/mL (ref 11–307)

## 2017-02-18 NOTE — Patient Instructions (Signed)
To lab today Stay on Xarelto blood thinner Return visit 8-10 weeks; no lab on return

## 2017-02-18 NOTE — Progress Notes (Signed)
Hematology and Oncology Follow Up Visit  Katrina Donaldson [id number] 1956-03-15 61 y.o. 02/18/2017 3:23 PM   Principle Diagnosis: Encounter Diagnoses  Name Primary?  . Hypercoagulable state ([place]) Yes  . History of subarachnoid hemorrhage   . Acute deep vein thrombosis (DVT) of femoral vein of left lower extremity ([place])   . Other iron deficiency anemia   Clinical summary: 61 year old woman I initially evaluated back in April 2018 for advice on anticoagulation. She had what I feel was a provoked right lower extremity DVT in 2005 following a long car drive. 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 Anticoagulation was resumed with Xarelto. She still had significant handicaps and was not ambulatory at the time of her visit with me. I recommended continuing full dose anticoagulation. Laboratory studies showed a recovering iron deficiency anemia. Anticardiolipin antibodies and antibodies to beta 2 glycoprotein 1 were not detected. I did not see any value in any additional genetic testing given her age and the provoked nature of her clots. A d-dimer was still elevated at 1.37 on 10/22/2016 but some of this likely reflected inflammatory changes from her recent spinal surgery.  Interim History:  Her recovery continues to be slow. The only time that she ambulates is when she is with the physical therapist. She spends the rest of her day in bed or in the wheelchair. She denied any dyspnea, chest pain, palpitations, recurrent calf pain or swelling. She is wearing elastic hose. Lab done today shows that she has a continued improvement in her hemoglobin now up to 12.3 g with ferritin 118. D-dimer still elevated but trending down and 0.91 compared with 1.37 in April.  Medications:   Current Outpatient Prescriptions:  .  amLODipine (NORVASC) 10 MG tablet, Take 1 tablet (10 mg total) by mouth daily., Disp: 30 tablet, Rfl: 1 .  ferrous sulfate 325 (65 FE) MG tablet, Take 1 tablet (325 mg total) by mouth daily with breakfast., Disp: 30 tablet, Rfl: 3 .  fluticasone (FLONASE) 50 MCG/ACT nasal spray, Place 1 spray into both nostrils daily., Disp: , Rfl:  .  lidocaine (LIDODERM) 5 %, Place 1 patch onto the skin daily. Remove & Discard patch within 12 hours or as directed by MD, Disp: 30 patch, Rfl: 0 .  methocarbamol (ROBAXIN) 500 MG tablet, Take 1 tablet (500 mg total) by mouth every 6 (six) hours as needed for muscle spasms., Disp: 60 tablet, Rfl: 0 .  metoprolol (LOPRESSOR) 100 MG tablet, Take 1 tablet (100 mg total) by mouth 2 (two) times daily., Disp: 60 tablet, Rfl: 0 .  Multiple Vitamin (MULTIVITAMIN WITH MINERALS) TABS tablet, Take 1 tablet by mouth daily., Disp: , Rfl:  .  omeprazole (PRILOSEC) 20 MG capsule, Take 1 capsule (20 mg total) by mouth daily., Disp: 30 capsule, Rfl: 0 .  oxyCODONE-acetaminophen (PERCOCET/ROXICET) 5-325 MG tablet, Take 1-2 tablets by mouth every 4 (four) hours as needed for moderate pain., Disp: 30 tablet, Rfl: 0 .  PARoxetine (PAXIL) 20 MG tablet, Take 1 tablet (20 mg total) by mouth daily., Disp: 30 tablet, Rfl: 0 .  rivaroxaban (XARELTO) 20 MG TABS tablet, Take 1 tablet (20 mg total) by mouth daily with supper., Disp: 30 tablet, Rfl: 1 .  vitamin C (VITAMIN C) 250 MG tablet, Take 1 tablet (250 mg total) by mouth  daily with breakfast., Disp: , Rfl:   Allergies:  Allergies  Allergen Reactions  . Other Other (See Comments)    Allergic to shellfish such as shrimp     Review of Systems: See interim history  Remaining ROS negative:   Physical Exam: Blood pressure 117/69, pulse (!) 56, temperature 97.9 F (36.6 C), temperature source Oral, height 5\' 5"  (1.651 m), SpO2 99 %. Wt Readings from Last 3 Encounters:  12/10/16 220 lb 8 oz (100  kg)  10/22/16 212 lb 4.8 oz (96.3 kg)  07/24/16 189 lb 9.5 oz (86 kg)     General appearance: Well-nourished African-American woman in a wheelchair HENNT: Pharynx no erythema, exudate, mass, or ulcer. No thyromegaly or thyroid nodules Lymph nodes: No cervical, supraclavicular, or axillary lymphadenopathy Breasts:  Lungs: Clear to auscultation, resonant to percussion throughout Heart: Regular rhythm, no murmur, no gallop, no rub, no click, no edema Abdomen: Soft, nontender, normal bowel sounds, no mass, no organomegaly, incomplete exam Extremities: 2+ ankle edema, no calf tenderness Measurements: 39 cm on the right, 37.5 on the left. Ankle measurements: 30 cm on the right, 29 on the left. Musculoskeletal: no joint deformities GU:  Vascular: Carotid pulses 2+, no bruits,  Neurologic: Alert, oriented, PERRLA, exudate, cranial nerves grossly normal, motor strength 5 over 5, upper extremities. 3+ over 5 strength at the right ankle in both flexion and extension 4/5 in flexion at the right hip. reflexes 1+ symmetric, upper body coordination normal, gait not tested Skin: No rash or ecchymosis  Lab Results: CBC W/Diff    Component Value Date/Time   WBC 5.7 02/18/2017 1202   RBC 4.07 02/18/2017 1202   HGB 12.3 02/18/2017 1202   HGB 11.9 10/22/2016 1459   HCT 37.1 02/18/2017 1202   HCT 36.0 10/22/2016 1459   PLT 239 02/18/2017 1202   PLT 257 10/22/2016 1459   MCV 91.2 02/18/2017 1202   MCV 88 10/22/2016 1459   MCH 30.2 02/18/2017 1202   MCHC 33.2 02/18/2017 1202   RDW 15.0 02/18/2017 1202   RDW 18.1 (H) 10/22/2016 1459   LYMPHSABS 1.9 02/18/2017 1202   LYMPHSABS 2.0 10/22/2016 1459   MONOABS 0.5 02/18/2017 1202   EOSABS 0.1 02/18/2017 1202   EOSABS 0.1 10/22/2016 1459   BASOSABS 0.0 02/18/2017 1202   BASOSABS 0.0 10/22/2016 1459     Chemistry      Component Value Date/Time   NA 139 08/16/2016 0516   NA 133 (A) 12/14/2014   K 4.4 08/16/2016 0516   CL 106 08/16/2016 0516   CO2  26 08/16/2016 0516   BUN 10 08/16/2016 0516   BUN 8 12/14/2014   CREATININE 0.89 08/16/2016 0516   GLU 94 12/14/2014      Component Value Date/Time   CALCIUM 8.4 (L) 08/16/2016 0516   ALKPHOS 87 07/25/2016 0738   AST 22 07/25/2016 0738   ALT 16 07/25/2016 0738   BILITOT 0.7 07/25/2016 0738       Radiological Studies: No results found.  Impression:  #1. Recurrent, provoked, proximal, right lower extremity, DVT. Ongoing risk for rethrombosis in view of immobile state and persistent elevation of d-dimer. I will continue full dose anticoagulation with Xarelto with periodic reevaluation. I will see her again in 8-10 weeks.  #2. Iron deficiency anemia secondary to surgical blood loss resolved with oral iron replacement.   #3. Would appear to be now fixed neurologic deficits of her right lower extremity secondary to impending cord compression from a subsequently resected arachnoid cyst.  CC: Patient Care Team: Donald Prose, MD as PCP - General (Family Medicine)   Murriel Hopper, MD, [place]  Hematology-Oncology/Internal Medicine     8/21/20183:23 PM

## 2017-02-18 NOTE — Addendum Note (Signed)
Addended by: Truddie Crumble on: 02/18/2017 12:02 PM   Modules accepted: Orders

## 2017-02-19 ENCOUNTER — Ambulatory Visit: Payer: BC Managed Care – PPO | Admitting: Physical Therapy

## 2017-02-19 ENCOUNTER — Encounter: Payer: BC Managed Care – PPO | Admitting: Occupational Therapy

## 2017-02-20 ENCOUNTER — Ambulatory Visit: Payer: BC Managed Care – PPO | Admitting: Physical Therapy

## 2017-02-20 ENCOUNTER — Encounter: Payer: Self-pay | Admitting: Physical Therapy

## 2017-02-20 DIAGNOSIS — R262 Difficulty in walking, not elsewhere classified: Secondary | ICD-10-CM

## 2017-02-20 DIAGNOSIS — R2681 Unsteadiness on feet: Secondary | ICD-10-CM

## 2017-02-20 DIAGNOSIS — M6281 Muscle weakness (generalized): Secondary | ICD-10-CM | POA: Diagnosis not present

## 2017-02-20 NOTE — Therapy (Signed)
[place], [place], [address] Phone: [phone]   Fax:  [phone]  Physical Therapy Treatment  Patient Details  Name: Katrina Donaldson MRN: [id number] Date of Birth: 10-Jan-1956 Referring Provider: Dr. Posey Pronto  Encounter Date: 02/20/2017      PT End of Session - 02/20/17 1058    Visit Number 4   Date for PT Re-Evaluation 04/08/17   PT Start Time 1027   PT Stop Time 1058   PT Time Calculation (min) 43 min   Equipment Utilized During Treatment Gait belt   Activity Tolerance Patient tolerated treatment well;Patient limited by fatigue   Behavior During Therapy [facility] for tasks assessed/performed      Past Medical History:  Diagnosis Date  . Brain aneurysm   . Depression   . History of DVT (deep vein thrombosis)   . Hypercoagulable state ([place])   . Hypertension   . Hypothyroidism   . Stroke [facility]) 2016   [facility] 11/29/14    Past Surgical History:  Procedure Laterality Date  . ABDOMINAL HYSTERECTOMY     patient denies  . BRAIN SURGERY     aneurysm  . goiter    . LAMINECTOMY N/A 07/19/2016   Procedure: LAMINECTOMY THORACIC FOUR THORACIC FIVE, THORACIC SIX TO THORACIC EIGHT,THORACIC TEN TO LUMBAR ONE, FENESTRATION OF ARACHNOID CYSTS;  Surgeon: Consuella Lose, MD;  Location: [place];  Service: Neurosurgery;  Laterality: N/A;  . THYROIDECTOMY      There were no vitals filed for this visit.      Subjective Assessment - 02/20/17 1015    Subjective OK   Currently in Pain? No/denies   Pain Score 0-No pain                         [facility] Adult PT Treatment/Exercise - 02/20/17 0001      Knee/Hip Exercises: Standing   Hip Flexion Stengthening;Both;1 set;10 reps;Knee bent  RW     Knee/Hip Exercises: Seated   Long Arc Quad 2 sets;10 reps;Both   Ball Squeeze 20   Clamshell with TheraBand Red   Sit to [employer] with UE support;2 sets;5 reps  RW and min assist      Shoulder  Exercises: Seated   Row Left;20 reps;Theraband   Theraband Level (Shoulder Row) Level 1 (Yellow)   Other Seated Exercises [place] traises with yellow ball x15    Other Seated Exercises trunk rotations yellow ball x10      Shoulder Exercises: ROM/Strengthening   UBE (Upper Arm Bike) L1.5 28fd/2rev                  PT Short Term Goals - 02/13/17 1153      PT SHORT TERM GOAL #1   Title NEW TARGET DATE FOR ALL STGs: 01/30/2017: Pt will be IND with HEP to improve strength, endurance, balance, and flexibility.    Status Partially Met     PT SHORT TERM GOAL #2   Title Pt will perform sit to stand and stand pivot transfers with RW with min A    Baseline not consistant and depends on surface height   Status Partially Met     PT SHORT TERM GOAL #3   Title Pt will amb. 115' with RW with min A over level, indoor surfaces.   Status On-going     PT SHORT TERM GOAL #4   Title Improve TUG to <3 minutes with RW and min A  Status On-going           PT Long Term Goals - 02/06/17 1016      PT LONG TERM GOAL #1   Title Pt will ambulate x >250' over level indoor surfaces with RW with supervision   Time 8   Period Weeks   Status New     PT LONG TERM GOAL #2   Title Pt will perform sit <> stand and stand pivot transfers CGA with RW    Time 8   Period Weeks   Status New     PT LONG TERM GOAL #3   Title Pt will demonstrate ability to release RW and maintain upright trunk posture to safely reach for items outside BOS with supervision x 8 reps   Time 8   Period Weeks   Status New     PT LONG TERM GOAL #4   Title Pt will verbalize fall prevention strategies to reduce falls risk.    Time 8   Period Weeks   Status New     PT LONG TERM GOAL #5   Title Pt will improve safety with gait as indicated by TUG decreased to 150 seconds with CGA   Baseline 3:26 with mod A with RW   Time 8   Period Weeks   Status New               Plan - 02/20/17 1059    Clinical Impression  Statement Transfers vary and are inconsistent. One gait trial ~ 10 feet walking to UBE with RW, min assist needed. All exercises performed in elevated UBE seat. Unable to move RLE to amb post treatment.    Rehab Potential Fair   PT Frequency 2x / week   PT Duration 8 weeks   PT Treatment/Interventions ADLs/Self Care Home Management;Biofeedback;Canalith Repostioning;Electrical Stimulation;Neuromuscular re-education;Balance training;Therapeutic exercise;Manual techniques;Therapeutic activities;Functional mobility training;Gait training;DME Instruction;Orthotic Fit/Training;Patient/family education;Vestibular;Stair training;Passive range of motion;Energy conservation   PT Next Visit Plan LE strength/gait /transfers      Patient will benefit from skilled therapeutic intervention in order to improve the following deficits and impairments:  Abnormal gait, Decreased endurance, Impaired sensation, Decreased strength, Decreased knowledge of use of DME, Decreased balance, Decreased mobility, Decreased range of motion, Decreased coordination, Impaired flexibility, Postural dysfunction, Difficulty walking  Visit Diagnosis: Muscle weakness (generalized)  Difficulty in walking, not elsewhere classified  Unsteadiness on feet     Problem List Patient Active Problem List   Diagnosis Date Noted  . Urinary retention   . Acute pain of right knee   . Acute lower UTI   . Weakness of right lower extremity   . Confusion   . Hydrocephalus   . Poor appetite   . Acute blood loss anemia   . Post-operative pain   . Hypokalemia   . Leukocytosis   . Thrombocytopenia ([place])   . Acute deep vein thrombosis (DVT) of femoral vein of left lower extremity ([place])   . Myelopathy ([place]) 07/24/2016  . Thoracic myelopathy   . Depression   . Benign essential HTN   . History of subarachnoid hemorrhage   . Hypercoagulable state ([place])   . Constipation due to pain medication   . 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  Protein-calorie malnutrition ([place]) 12/21/2014  . Thyroid activity decreased 12/21/2014  . 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 Essential hypertension 12/13/2014  . Anemia, iron deficiency 12/13/2014  . Hypothyroidism 12/13/2014  . Prediabetes 12/13/2014    Scot Jun,  PTA 02/20/2017, 11:01 AM  [place], [place], [address] Phone: [phone]   Fax:  [phone]  Name: Katrina Donaldson MRN: [id number] Date of Birth: [birth year]

## 2017-02-21 ENCOUNTER — Ambulatory Visit: Payer: BC Managed Care – PPO | Admitting: Physical Therapy

## 2017-02-21 ENCOUNTER — Encounter: Payer: BC Managed Care – PPO | Admitting: Occupational Therapy

## 2017-02-21 DIAGNOSIS — M4714 Other spondylosis with myelopathy, thoracic region: Secondary | ICD-10-CM

## 2017-02-21 DIAGNOSIS — G959 Disease of spinal cord, unspecified: Secondary | ICD-10-CM

## 2017-02-21 DIAGNOSIS — R2681 Unsteadiness on feet: Secondary | ICD-10-CM

## 2017-02-21 DIAGNOSIS — F09 Unspecified mental disorder due to known physiological condition: Secondary | ICD-10-CM

## 2017-02-21 DIAGNOSIS — F331 Major depressive disorder, recurrent, moderate: Secondary | ICD-10-CM

## 2017-02-21 DIAGNOSIS — R410 Disorientation, unspecified: Secondary | ICD-10-CM

## 2017-02-21 DIAGNOSIS — G9619 Other disorders of meninges, not elsewhere classified: Secondary | ICD-10-CM

## 2017-02-21 DIAGNOSIS — G96198 Other disorders of meninges, not elsewhere classified: Secondary | ICD-10-CM

## 2017-02-21 DIAGNOSIS — M6281 Muscle weakness (generalized): Secondary | ICD-10-CM | POA: Diagnosis not present

## 2017-02-21 DIAGNOSIS — R262 Difficulty in walking, not elsewhere classified: Secondary | ICD-10-CM

## 2017-02-21 DIAGNOSIS — I609 Nontraumatic subarachnoid hemorrhage, unspecified: Secondary | ICD-10-CM

## 2017-02-21 DIAGNOSIS — R208 Other disturbances of skin sensation: Secondary | ICD-10-CM

## 2017-02-21 DIAGNOSIS — R2689 Other abnormalities of gait and mobility: Secondary | ICD-10-CM

## 2017-02-21 NOTE — Therapy (Signed)
[place], [place], [address] Phone: [phone]   Fax:  [phone]  Physical Therapy Treatment  Patient Details  Name: Katrina Donaldson MRN: [id number] Date of Birth: 1956-06-20 Referring Provider: Dr. Posey Pronto  Encounter Date: 02/21/2017      PT End of Session - 02/21/17 1156    Visit Number 5   Number of Visits 30   Date for PT Re-Evaluation 04/08/17   PT Start Time 1100   PT Stop Time 1145   PT Time Calculation (min) 45 min   Equipment Utilized During Treatment Gait belt   Activity Tolerance Patient tolerated treatment well;Patient limited by fatigue      Past Medical History:  Diagnosis Date   Brain aneurysm    Depression    History of DVT (deep vein thrombosis)    Hypercoagulable state ([place])    Hypertension    Hypothyroidism    Stroke ([place]) 2016   [facility] 11/29/14    Past Surgical History:  Procedure Laterality Date   ABDOMINAL HYSTERECTOMY     patient denies   BRAIN SURGERY     aneurysm   goiter     LAMINECTOMY N/A 07/19/2016   Procedure: LAMINECTOMY THORACIC FOUR THORACIC FIVE, THORACIC SIX TO THORACIC EIGHT,THORACIC TEN TO LUMBAR ONE, FENESTRATION OF ARACHNOID CYSTS;  Surgeon: Consuella Lose, MD;  Location: [place];  Service: Neurosurgery;  Laterality: N/A;   THYROIDECTOMY      There were no vitals filed for this visit.      Subjective Assessment - 02/21/17 1155    Subjective Tired after wheeling myself down the hall                         [facility] Adult PT Treatment/Exercise - 02/21/17 0001      Transfers   Sit to Stand 3: Mod assist   Sit to Stand Details Tactile cues for placement;Verbal cues for technique;Verbal cues for sequencing;Tactile cues for sequencing   Stand to Sit 3: Mod assist;With upper extremity assist;With armrests   Stand to Sit Details (indicate cue type and reason) Verbal cues for sequencing;Verbal cues for  technique;Verbal cues for precautions/safety;Verbal cues for safe use of DME/AE;Manual facilitation for placement;Manual facilitation for weight shifting;Tactile cues for weight shifting     Ambulation/Gait   Ambulation/Gait Yes   Ambulation/Gait Assistance 4: Min assist;3: Mod assist   Ambulation Distance (Feet) 20 Feet   Assistive device Rolling walker   Gait Pattern Step-through pattern;Decreased step length - left;Decreased stance time - right;Decreased stride length;Decreased hip/knee flexion - right;Decreased dorsiflexion - right;Decreased weight shift to right;Right genu recurvatum;Trunk rotated posteriorly on right;Trunk flexed;Poor foot clearance - right   Ambulation Surface Level   Pre-Gait Activities standing weight shift laterally with hands on clinician shoulders and gait belt     Knee/Hip Exercises: Standing   Other Standing Knee Exercises toe taps, 2" 1x5ea b     Knee/Hip Exercises: Seated   Long Arc Quad 2 sets;10 reps;Both   Ball Squeeze 20   Sit to [employer] with UE support;2 sets;5 reps     Shoulder Exercises: Seated   Other Seated Exercises ball raises taps 2 kg 2x8ea                  PT Short Term Goals - 02/13/17 1153      PT SHORT TERM GOAL #1   Title NEW TARGET DATE FOR ALL STGs: 01/30/2017: Pt will  be IND with HEP to improve strength, endurance, balance, and flexibility.    Status Partially Met     PT SHORT TERM GOAL #2   Title Pt will perform sit to stand and stand pivot transfers with RW with min A    Baseline not consistant and depends on surface height   Status Partially Met     PT SHORT TERM GOAL #3   Title Pt will amb. 115' with RW with min A over level, indoor surfaces.   Status On-going     PT SHORT TERM GOAL #4   Title Improve TUG to <3 minutes with RW and min A   Status On-going           PT Long Term Goals - 02/06/17 1016      PT LONG TERM GOAL #1   Title Pt will ambulate x >250' over level indoor surfaces with RW with  supervision   Time 8   Period Weeks   Status New     PT LONG TERM GOAL #2   Title Pt will perform sit <> stand and stand pivot transfers CGA with RW    Time 8   Period Weeks   Status New     PT LONG TERM GOAL #3   Title Pt will demonstrate ability to release RW and maintain upright trunk posture to safely reach for items outside BOS with supervision x 8 reps   Time 8   Period Weeks   Status New     PT LONG TERM GOAL #4   Title Pt will verbalize fall prevention strategies to reduce falls risk.    Time 8   Period Weeks   Status New     PT LONG TERM GOAL #5   Title Pt will improve safety with gait as indicated by TUG decreased to 150 seconds with CGA   Baseline 3:26 with mod A with RW   Time 8   Period Weeks   Status New               Plan - 02/21/17 1156    Clinical Impression Statement [place] decreased ability to advance Right LE with standing/ gait activities.  May benefit from an AFO to improve indepence and improve efficiency.  Variable assist required with transfers and gait.  Increased assist needed with fatigue.  Quick to fatigue   Clinical Impairments Affecting Rehab Potential extensive PMH      Patient will benefit from skilled therapeutic intervention in order to improve the following deficits and impairments:  Abnormal gait, Decreased endurance, Impaired sensation, Decreased strength, Decreased knowledge of use of DME, Decreased balance, Decreased mobility, Decreased range of motion, Decreased coordination, Impaired flexibility, Postural dysfunction, Difficulty walking  Visit Diagnosis: Moderate episode of recurrent major depressive disorder (HCC)  Muscle weakness (generalized)  Difficulty in walking, not elsewhere classified  SAH (subarachnoid hemorrhage) (HCC)  Unsteadiness on feet  Confusion  Myelopathy (HCC)  Other abnormalities of gait and mobility  Thoracic spondylosis with myelopathy  Other disturbances of skin sensation  Spinal  arachnoid cyst  Cognitive disorder     Problem List Patient Active Problem List   Diagnosis Date Noted   Urinary retention    Acute pain of right knee    Acute lower UTI    Weakness of right lower extremity    Confusion    Hydrocephalus    Poor appetite    Acute blood loss anemia    Post-operative pain    Hypokalemia  Leukocytosis    Thrombocytopenia (HCC)    Acute deep vein thrombosis (DVT) of femoral vein of left lower extremity (HCC)    Myelopathy ([place]) 07/24/2016   Thoracic myelopathy    Depression    Benign essential HTN    History of subarachnoid hemorrhage    Hypercoagulable state ([place])    Constipation due to pain medication    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   Protein-calorie malnutrition ([place]) 12/21/2014   Thyroid activity decreased 12/21/2014   SAH (subarachnoid hemorrhage), 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

## 2017-02-24 ENCOUNTER — Encounter: Payer: BC Managed Care – PPO | Admitting: Occupational Therapy

## 2017-02-24 ENCOUNTER — Ambulatory Visit: Payer: BC Managed Care – PPO | Admitting: Physical Therapy

## 2017-02-25 ENCOUNTER — Ambulatory Visit: Payer: BC Managed Care – PPO | Admitting: Physical Therapy

## 2017-02-26 ENCOUNTER — Ambulatory Visit: Payer: BC Managed Care – PPO | Admitting: Physical Therapy

## 2017-02-27 ENCOUNTER — Ambulatory Visit: Payer: BC Managed Care – PPO | Admitting: Physical Therapy

## 2017-02-27 ENCOUNTER — Encounter: Payer: BC Managed Care – PPO | Admitting: Occupational Therapy

## 2017-02-28 ENCOUNTER — Encounter: Payer: BC Managed Care – PPO | Admitting: Physical Therapy

## 2017-02-28 ENCOUNTER — Encounter: Payer: BC Managed Care – PPO | Admitting: Physical Medicine & Rehabilitation

## 2017-03-04 ENCOUNTER — Ambulatory Visit: Payer: BC Managed Care – PPO | Attending: Physical Medicine & Rehabilitation | Admitting: Physical Therapy

## 2017-03-04 ENCOUNTER — Encounter: Payer: Self-pay | Admitting: Physical Therapy

## 2017-03-04 DIAGNOSIS — I609 Nontraumatic subarachnoid hemorrhage, unspecified: Secondary | ICD-10-CM | POA: Insufficient documentation

## 2017-03-04 DIAGNOSIS — R262 Difficulty in walking, not elsewhere classified: Secondary | ICD-10-CM | POA: Diagnosis present

## 2017-03-04 DIAGNOSIS — R2681 Unsteadiness on feet: Secondary | ICD-10-CM | POA: Diagnosis present

## 2017-03-04 DIAGNOSIS — M6281 Muscle weakness (generalized): Secondary | ICD-10-CM | POA: Insufficient documentation

## 2017-03-04 NOTE — Therapy (Signed)
[place], [place], [address] Phone: [phone]   Fax:  [phone]  Physical Therapy Treatment  Patient Details  Name: Katrina Donaldson MRN: [id number] Date of Birth: September 06, 1955 Referring Provider: Dr. Posey Pronto  Encounter Date: 03/04/2017      PT End of Session - 03/04/17 1110    Visit Number 6   Number of Visits 30   Date for PT Re-Evaluation 04/08/17   PT Start Time 3545   PT Stop Time 1122   PT Time Calculation (min) 47 min      Past Medical History:  Diagnosis Date  . Brain aneurysm   . Depression   . History of DVT (deep vein thrombosis)   . Hypercoagulable state ([place])   . Hypertension   . Hypothyroidism   . Stroke [facility]) 2016   [facility] 11/29/14    Past Surgical History:  Procedure Laterality Date  . ABDOMINAL HYSTERECTOMY     patient denies  . BRAIN SURGERY     aneurysm  . goiter    . LAMINECTOMY N/A 07/19/2016   Procedure: LAMINECTOMY THORACIC FOUR THORACIC FIVE, THORACIC SIX TO THORACIC EIGHT,THORACIC TEN TO LUMBAR ONE, FENESTRATION OF ARACHNOID CYSTS;  Surgeon: Consuella Lose, MD;  Location: [place];  Service: Neurosurgery;  Laterality: N/A;  . THYROIDECTOMY      There were no vitals filed for this visit.      Subjective Assessment - 03/04/17 1038    Subjective missed last week d/t SCAT running late. RT leg jumping/spasms   Currently in Pain? Yes   Pain Score 4    Pain Location Shoulder   Pain Orientation Left                         [facility] Adult PT Treatment/Exercise - 03/04/17 0001      Transfers   Transfers Stand Pivot Transfers  max A   Sit to Stand 2: Max assist;With upper extremity assist     Ambulation/Gait   Gait Comments amb with  8 feet 2 times with max A with RW RT leg spams causing LOB     Knee/Hip Exercises: Aerobic   Nustep L 4 6 min LE with minimal UE help  tied legs to help with knees falling out into abd   Other Aerobic L 3 3  fwd/3 back     Knee/Hip Exercises: Standing   Other Standing Knee Exercises stood at sink 3 times less tahn 30 sec each time, working on upright posture and straightening knees, RT knee spasming so hard it limited ability to stand                  PT Short Term Goals - 02/13/17 1153      PT SHORT TERM GOAL #1   Title NEW TARGET DATE FOR ALL STGs: 01/30/2017: Pt will be IND with HEP to improve strength, endurance, balance, and flexibility.    Status Partially Met     PT SHORT TERM GOAL #2   Title Pt will perform sit to stand and stand pivot transfers with RW with min A    Baseline not consistant and depends on surface height   Status Partially Met     PT SHORT TERM GOAL #3   Title Pt will amb. 115' with RW with min A over level, indoor surfaces.   Status On-going     PT SHORT TERM GOAL #4   Title  Improve TUG to <3 minutes with RW and min A   Status On-going           PT Long Term Goals - 02/06/17 1016      PT LONG TERM GOAL #1   Title Pt will ambulate x >250' over level indoor surfaces with RW with supervision   Time 8   Period Weeks   Status New     PT LONG TERM GOAL #2   Title Pt will perform sit <> stand and stand pivot transfers CGA with RW    Time 8   Period Weeks   Status New     PT LONG TERM GOAL #3   Title Pt will demonstrate ability to release RW and maintain upright trunk posture to safely reach for items outside BOS with supervision x 8 reps   Time 8   Period Weeks   Status New     PT LONG TERM GOAL #4   Title Pt will verbalize fall prevention strategies to reduce falls risk.    Time 8   Period Weeks   Status New     PT LONG TERM GOAL #5   Title Pt will improve safety with gait as indicated by TUG decreased to 150 seconds with CGA   Baseline 3:26 with mod A with RW   Time 8   Period Weeks   Status New               Plan - 03/04/17 1110    Clinical Impression Statement decreased func today with increased RT LE spasms and  decreased control, causing limited ability to walk or stand. no goals met this week as missed last week d/t SCAT. asked pt to call MD re: spasms   PT Treatment/Interventions ADLs/Self Care Home Management;Biofeedback;Canalith Repostioning;Electrical Stimulation;Neuromuscular re-education;Balance training;Therapeutic exercise;Manual techniques;Therapeutic activities;Functional mobility training;Gait training;DME Instruction;Orthotic Fit/Training;Patient/family education;Vestibular;Stair training;Passive range of motion;Energy conservation   PT Next Visit Plan assess and progress LE strength/gait /transfers       Patient will benefit from skilled therapeutic intervention in order to improve the following deficits and impairments:  Abnormal gait, Decreased endurance, Impaired sensation, Decreased strength, Decreased knowledge of use of DME, Decreased balance, Decreased mobility, Decreased range of motion, Decreased coordination, Impaired flexibility, Postural dysfunction, Difficulty walking  Visit Diagnosis: Muscle weakness (generalized)  Difficulty in walking, not elsewhere classified  SAH (subarachnoid hemorrhage) ([place])     Problem List Patient Active Problem List   Diagnosis Date Noted  . Urinary retention   . Acute pain of right knee   . Acute lower UTI   . Weakness of right lower extremity   . Confusion   . Hydrocephalus   . Poor appetite   . Acute blood loss anemia   . Post-operative pain   . Hypokalemia   . Leukocytosis   . Thrombocytopenia ([place])   . Acute deep vein thrombosis (DVT) of femoral vein of left lower extremity ([place])   . Myelopathy ([place]) 07/24/2016  . Thoracic myelopathy   . Depression   . Benign essential HTN   . History of subarachnoid hemorrhage   . Hypercoagulable state ([place])   . Constipation due to pain medication   . 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 Protein-calorie malnutrition ([place]) 12/21/2014  . Thyroid activity decreased 12/21/2014  . 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 Essential hypertension 12/13/2014  . Anemia, iron deficiency 12/13/2014  . Hypothyroidism 12/13/2014  . Prediabetes 12/13/2014    Derral Colucci,ANGIE PTA 03/04/2017,  11:23 AM  [place], [place], [address] Phone: [phone]   Fax:  [phone]  Name: Katrina Donaldson MRN: [id number] Date of Birth: [birth year]

## 2017-03-06 ENCOUNTER — Telehealth: Payer: Self-pay | Admitting: Physical Therapy

## 2017-03-06 ENCOUNTER — Ambulatory Visit: Payer: BC Managed Care – PPO | Admitting: Physical Therapy

## 2017-03-06 NOTE — Telephone Encounter (Signed)
03/06/17 pt cxl PT due to transportation. Transportation failed to pick her up

## 2017-03-11 ENCOUNTER — Encounter: Payer: Self-pay | Admitting: Physical Therapy

## 2017-03-11 ENCOUNTER — Ambulatory Visit: Payer: BC Managed Care – PPO | Admitting: Physical Therapy

## 2017-03-11 DIAGNOSIS — R262 Difficulty in walking, not elsewhere classified: Secondary | ICD-10-CM

## 2017-03-11 DIAGNOSIS — M6281 Muscle weakness (generalized): Secondary | ICD-10-CM

## 2017-03-11 DIAGNOSIS — R2681 Unsteadiness on feet: Secondary | ICD-10-CM

## 2017-03-11 NOTE — Therapy (Signed)
[facility] [address] [place], [place], [address] Phone: [phone]   Fax:  [phone]  Physical Therapy Treatment  Patient Details  Name: Katrina Donaldson MRN: [id number] Date of Birth: Dec 18, 1955 Referring Provider: Dr. Allena Katz  Encounter Date: 03/11/2017      PT End of Session - 03/11/17 1137    Visit Number 7   Number of Visits 30   Date for PT Re-Evaluation 04/08/17   PT Start Time 1040   PT Stop Time 1130   PT Time Calculation (min) 50 min      Past Medical History:  Diagnosis Date  . Brain aneurysm   . Depression   . History of DVT (deep vein thrombosis)   . Hypercoagulable state ([facility])   . Hypertension   . Hypothyroidism   . Stroke [facility]) 2016   [facility] 11/29/14    Past Surgical History:  Procedure Laterality Date  . ABDOMINAL HYSTERECTOMY     patient denies  . BRAIN SURGERY     aneurysm  . goiter    . LAMINECTOMY N/A 07/19/2016   Procedure: LAMINECTOMY THORACIC FOUR THORACIC FIVE, THORACIC SIX TO THORACIC EIGHT,THORACIC TEN TO LUMBAR ONE, FENESTRATION OF ARACHNOID CYSTS;  Surgeon: Lisbeth Renshaw, MD;  [facility];  Service: Neurosurgery;  Laterality: N/A;  . THYROIDECTOMY      There were no vitals filed for this visit.      Subjective Assessment - 03/11/17 1033    Subjective spoke with MD and starte dme back on muscle relaxers and it is helping spasms.  only standing when need too and no walking   Currently in Pain? No/denies                         [facility] Adult PT Treatment/Exercise - 03/11/17 0001      Transfers   Transfers Stand Pivot Transfers   Sit to Stand 2: Max assist  +1     Ambulation/Gait   Pre-Gait Activities stood 3 times less tahn 45 sec each time working on upright posture and knee control   Gait Comments amb 1 time 4 stesp with max A of 2. left knee bucklingand assistance to advance RT LE     Knee/Hip Exercises: Aerobic   Nustep L 2 7 min arms  and legs  legs wrapped with tband to keep out of abd     Knee/Hip Exercises: Seated   Long Arc Quad Strengthening;Both;10 reps   Ball Squeeze 15   Knee/Hip Flexion hip flex 15 min A RT    Other Seated Knee/Hip Exercises isometric hip abd  seated core ex with resistance, func reaching and ball ex                PT Education - 03/11/17 1136    Education provided Yes   Education Details STRESSED need to stadn more at home at sink to buidl strength and stim LE   Person(s) Educated Patient   Methods Explanation   Comprehension Verbalized understanding          PT Short Term Goals - 03/11/17 1105      PT SHORT TERM GOAL #2   Title Pt will perform sit to stand and stand pivot transfers with RW with min A    Baseline not consistant and depends on surface height   Status Partially Met     PT SHORT TERM GOAL #3   Title Pt will amb. 115'  with RW with min A over level, indoor surfaces.   Status Not Met     PT SHORT TERM GOAL #4   Title Improve TUG to <3 minutes with RW and min A   Status Not Met           PT Long Term Goals - 03/11/17 1106      PT LONG TERM GOAL #1   Title Pt will ambulate x >250' over level indoor surfaces with RW with supervision   Status Not Met     PT LONG TERM GOAL #2   Title Pt will perform sit <> stand and stand pivot transfers CGA with RW    Status Not Met     PT LONG TERM GOAL #3   Title Pt will demonstrate ability to release RW and maintain upright trunk posture to safely reach for items outside BOS with supervision x 8 reps   Status Not Met     PT LONG TERM GOAL #4   Title Pt will verbalize fall prevention strategies to reduce falls risk.    Status Partially Met               Plan - 03/11/17 1137    Clinical Impression Statement minimal goal progress, stressed importanc eof standing at home. Decreased RT LE mvmt and Left LE support. Discussed regression with pt and need to do more at home. No spams noted   PT  Treatment/Interventions ADLs/Self Care Home Management;Biofeedback;Canalith Repostioning;Electrical Stimulation;Neuromuscular re-education;Balance training;Therapeutic exercise;Manual techniques;Therapeutic activities;Functional mobility training;Gait training;DME Instruction;Orthotic Fit/Training;Patient/family education;Vestibular;Stair training;Passive range of motion;Energy conservation   PT Next Visit Plan  progress LE strength/gait /transfers       Patient will benefit from skilled therapeutic intervention in order to improve the following deficits and impairments:  Abnormal gait, Decreased endurance, Impaired sensation, Decreased strength, Decreased knowledge of use of DME, Decreased balance, Decreased mobility, Decreased range of motion, Decreased coordination, Impaired flexibility, Postural dysfunction, Difficulty walking  Visit Diagnosis: Muscle weakness (generalized)  Difficulty in walking, not elsewhere classified  Unsteadiness on feet     Problem List Patient Active Problem List   Diagnosis Date Noted  . Urinary retention   . Acute pain of right knee   . Acute lower UTI   . Weakness of right lower extremity   . Confusion   . Hydrocephalus   . Poor appetite   . Acute blood loss anemia   . Post-operative pain   . Hypokalemia   . Leukocytosis   . Thrombocytopenia ([place])   . Acute deep vein thrombosis (DVT) of femoral vein of left lower extremity ([place])   . Myelopathy ([place]) 07/24/2016  . Thoracic myelopathy   . Depression   . Benign essential HTN   . History of subarachnoid hemorrhage   . Hypercoagulable state ([place])   . Constipation due to pain medication   . 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 Protein-calorie malnutrition ([place]) 12/21/2014  . Thyroid activity decreased 12/21/2014  . 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 Essential hypertension 12/13/2014  . Anemia, iron deficiency 12/13/2014  . Hypothyroidism 12/13/2014   . Prediabetes 12/13/2014    Wilfrid Hyser,ANGIE PTA 03/11/2017, 11:39 AM  [place], [place], [address] Phone: [phone]   Fax:  [phone]  Name: Laelynn Blizzard MRN: [id number] Date of Birth: February 03, 1956

## 2017-03-13 ENCOUNTER — Ambulatory Visit: Payer: BC Managed Care – PPO | Admitting: Physical Therapy

## 2017-03-13 ENCOUNTER — Encounter: Payer: Self-pay | Admitting: Occupational Therapy

## 2017-03-13 NOTE — Therapy (Signed)
[place] [address] [place], [place], [address] Phone: [phone]   Fax:  [phone]  Patient Details  Name: Katrina Donaldson MRN: [id number] Date of Birth: Jul 22, 1955 Referring Provider:  No ref. provider found  Encounter Date: 03/13/2017   OCCUPATIONAL THERAPY DISCHARGE SUMMARY  Visits from Start of Care: 1 (eval)  Current functional level related to goals / functional outcomes: See eval as pt did not return after eval   Remaining deficits: See eval as pt did not return after eval   Education / Equipment: Not completed as pt did not return  Plan: Patient agrees to discharge.  Patient goals were not met. Patient is being discharged due to not returning since the last visit.  Pt did not return after eval.   ?????         [facility] 03/13/2017, 9:10 AM  [place] [address] [place], [place], [address] Phone: [phone]   Fax:  Kyle, OTR/L [facility] [address] [place], [place]  [address] [phone] phone [phone] 03/13/17 9:11 AM

## 2017-03-18 ENCOUNTER — Ambulatory Visit: Payer: BC Managed Care – PPO | Admitting: Physical Therapy

## 2017-03-20 ENCOUNTER — Ambulatory Visit: Payer: BC Managed Care – PPO | Admitting: Physical Therapy

## 2017-03-20 DIAGNOSIS — R2681 Unsteadiness on feet: Secondary | ICD-10-CM

## 2017-03-20 DIAGNOSIS — M6281 Muscle weakness (generalized): Secondary | ICD-10-CM | POA: Diagnosis not present

## 2017-03-20 DIAGNOSIS — R262 Difficulty in walking, not elsewhere classified: Secondary | ICD-10-CM

## 2017-03-20 NOTE — Therapy (Signed)
[place], [place], [address] Phone: [phone]   Fax:  [phone]  Physical Therapy Treatment  Patient Details  Name: Katrina Donaldson MRN: [id number] Date of Birth: Nov 28, 1955 Referring Provider: Dr. Posey Pronto  Encounter Date: 03/20/2017      PT End of Session - 03/20/17 1557    Number of Visits 30   Date for PT Re-Evaluation 04/08/17   PT Start Time 1525   PT Stop Time 1610   PT Time Calculation (min) 45 min      Past Medical History:  Diagnosis Date  . Brain aneurysm   . Depression   . History of DVT (deep vein thrombosis)   . Hypercoagulable state ([place])   . Hypertension   . Hypothyroidism   . Stroke [facility]) 2016   [facility] 11/29/14    Past Surgical History:  Procedure Laterality Date  . ABDOMINAL HYSTERECTOMY     patient denies  . BRAIN SURGERY     aneurysm  . goiter    . LAMINECTOMY N/A 07/19/2016   Procedure: LAMINECTOMY THORACIC FOUR THORACIC FIVE, THORACIC SIX TO THORACIC EIGHT,THORACIC TEN TO LUMBAR ONE, FENESTRATION OF ARACHNOID CYSTS;  Surgeon: Consuella Lose, MD;  Location: [place];  Service: Neurosurgery;  Laterality: N/A;  . THYROIDECTOMY      There were no vitals filed for this visit.      Subjective Assessment - 03/20/17 1526    Subjective spasms started again in legs, shld hurt so cancel earlier in week from pulling up. standing "some" at home   Currently in Pain? Yes   Pain Score 5                          [facility] Adult PT Treatment/Exercise - 03/20/17 0001      Ambulation/Gait   Pre-Gait Activities amb with RW 10 feet 2 times min A to fascilitate wt shift left, w/c behind and PTA to advance RT LE   Gait Comments stood 2 times 45-60 sec  5 mini squats each time for strength     High Level Balance   High Level Balance Comments standing wt shift and func reaching     Knee/Hip Exercises: Aerobic   Nustep L 3 8 min  legs wrapped to aid with abd                   PT Short Term Goals - 03/11/17 1105      PT SHORT TERM GOAL #2   Title Pt will perform sit to stand and stand pivot transfers with RW with min A    Baseline not consistant and depends on surface height   Status Partially Met     PT SHORT TERM GOAL #3   Title Pt will amb. 115' with RW with min A over level, indoor surfaces.   Status Not Met     PT SHORT TERM GOAL #4   Title Improve TUG to <3 minutes with RW and min A   Status Not Met           PT Long Term Goals - 03/11/17 1106      PT LONG TERM GOAL #1   Title Pt will ambulate x >250' over level indoor surfaces with RW with supervision   Status Not Met     PT LONG TERM GOAL #2   Title Pt will perform sit <> stand and stand pivot transfers CGA  with RW    Status Not Met     PT LONG TERM GOAL #3   Title Pt will demonstrate ability to release RW and maintain upright trunk posture to safely reach for items outside BOS with supervision x 8 reps   Status Not Met     PT LONG TERM GOAL #4   Title Pt will verbalize fall prevention strategies to reduce falls risk.    Status Partially Met               Plan - 03/20/17 1558    Clinical Impression Statement pt with increased ability to stand and transfers with less assistance min- modA. worked on [place] wt shift and func reaching for core and balance with min A and cuing. gait with PTA assist to advance RT LE and PT to fasciliate wt shift left. encourage pt to continue with do more at home. slow goal progression but improved func form last session   PT Treatment/Interventions ADLs/Self Care Home Management;Biofeedback;Canalith Repostioning;Electrical Stimulation;Neuromuscular re-education;Balance training;Therapeutic exercise;Manual techniques;Therapeutic activities;Functional mobility training;Gait training;DME Instruction;Orthotic Fit/Training;Patient/family education;Vestibular;Stair training;Passive range of motion;Energy conservation   PT Next  Visit Plan  progress LE strength/gait /transfers       Patient will benefit from skilled therapeutic intervention in order to improve the following deficits and impairments:  Abnormal gait, Decreased endurance, Impaired sensation, Decreased strength, Decreased knowledge of use of DME, Decreased balance, Decreased mobility, Decreased range of motion, Decreased coordination, Impaired flexibility, Postural dysfunction, Difficulty walking  Visit Diagnosis: Muscle weakness (generalized)  Difficulty in walking, not elsewhere classified  Unsteadiness on feet     Problem List Patient Active Problem List   Diagnosis Date Noted  . Urinary retention   . Acute pain of right knee   . Acute lower UTI   . Weakness of right lower extremity   . Confusion   . Hydrocephalus   . Poor appetite   . Acute blood loss anemia   . Post-operative pain   . Hypokalemia   . Leukocytosis   . Thrombocytopenia ([place])   . Acute deep vein thrombosis (DVT) of femoral vein of left lower extremity ([place])   . Myelopathy ([place]) 07/24/2016  . Thoracic myelopathy   . Depression   . Benign essential HTN   . History of subarachnoid hemorrhage   . Hypercoagulable state ([place])   . Constipation due to pain medication   . 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 Protein-calorie malnutrition ([place]) 12/21/2014  . Thyroid activity decreased 12/21/2014  . 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 Essential hypertension 12/13/2014  . Anemia, iron deficiency 12/13/2014  . Hypothyroidism 12/13/2014  . Prediabetes 12/13/2014    Brinnley Lacap,ANGIE PTA 03/20/2017, 4:00 PM  [place], [place], [address] Phone: [phone]   Fax:  [phone]  Name: Sharese Manrique MRN: [id number] Date of Birth: 1955/07/29

## 2017-03-25 ENCOUNTER — Ambulatory Visit: Payer: BC Managed Care – PPO | Admitting: Physical Therapy

## 2017-03-28 ENCOUNTER — Encounter: Payer: Self-pay | Admitting: Physical Therapy

## 2017-03-28 ENCOUNTER — Ambulatory Visit: Payer: BC Managed Care – PPO | Admitting: Physical Therapy

## 2017-03-28 DIAGNOSIS — M6281 Muscle weakness (generalized): Secondary | ICD-10-CM | POA: Diagnosis not present

## 2017-03-28 DIAGNOSIS — R2681 Unsteadiness on feet: Secondary | ICD-10-CM

## 2017-03-28 DIAGNOSIS — R262 Difficulty in walking, not elsewhere classified: Secondary | ICD-10-CM

## 2017-03-28 DIAGNOSIS — I609 Nontraumatic subarachnoid hemorrhage, unspecified: Secondary | ICD-10-CM

## 2017-03-28 NOTE — Therapy (Signed)
[place], [place], [address] Phone: [phone]   Fax:  [phone]  Physical Therapy Treatment  Patient Details  Name: Katrina Donaldson MRN: [id number] Date of Birth: 01-May-1956 Referring Provider: Dr. Posey Pronto  Encounter Date: 03/28/2017      PT End of Session - 03/28/17 1100    Visit Number 8   Date for PT Re-Evaluation 04/08/17   PT Start Time 1015   PT Stop Time 1100   PT Time Calculation (min) 45 min   Equipment Utilized During Treatment Gait belt   Activity Tolerance Patient tolerated treatment well;Patient limited by fatigue   Behavior During Therapy [facility] for tasks assessed/performed      Past Medical History:  Diagnosis Date  . Brain aneurysm   . Depression   . History of DVT (deep vein thrombosis)   . Hypercoagulable state ([place])   . Hypertension   . Hypothyroidism   . Stroke [facility]) 2016   [facility] 11/29/14    Past Surgical History:  Procedure Laterality Date  . ABDOMINAL HYSTERECTOMY     patient denies  . BRAIN SURGERY     aneurysm  . goiter    . LAMINECTOMY N/A 07/19/2016   Procedure: LAMINECTOMY THORACIC FOUR THORACIC FIVE, THORACIC SIX TO THORACIC EIGHT,THORACIC TEN TO LUMBAR ONE, FENESTRATION OF ARACHNOID CYSTS;  Surgeon: Consuella Lose, MD;  Location: [place];  Service: Neurosurgery;  Laterality: N/A;  . THYROIDECTOMY      There were no vitals filed for this visit.      Subjective Assessment - 03/28/17 1020    Subjective "Im ok"   Currently in Pain? Yes   Pain Score 5    Pain Location --  joints                         [facility] Adult PT Treatment/Exercise - 03/28/17 0001      Knee/Hip Exercises: Seated   Long Arc Quad Strengthening;Both;10 reps;2 sets   [facility] 25   Other Seated Knee/Hip Exercises seated march 2x10 assi needed with RLE; heel and toe raises   Hamstring Curl AROM;Strengthening;Both;10 reps;Limitations;2 sets   Hamstring  Limitations yellow band. assistance     Shoulder Exercises: ROM/Strengthening   UBE (Upper Arm Bike) L2 x 6 min                   PT Short Term Goals - 03/11/17 1105      PT SHORT TERM GOAL #2   Title Pt will perform sit to stand and stand pivot transfers with RW with min A    Baseline not consistant and depends on surface height   Status Partially Met     PT SHORT TERM GOAL #3   Title Pt will amb. 115' with RW with min A over level, indoor surfaces.   Status Not Met     PT SHORT TERM GOAL #4   Title Improve TUG to <3 minutes with RW and min A   Status Not Met           PT Long Term Goals - 03/28/17 1108      PT LONG TERM GOAL #1   Title Pt will ambulate x >250' over level indoor surfaces with RW with supervision   Status Not Met     PT LONG TERM GOAL #2   Title Pt will perform sit <> stand and stand pivot transfers CGA with RW  Status Partially Met               Plan - 03/28/17 1100    Clinical Impression Statement today's treatment session went fair. Assist needed with most intervention on RLE. Mod assist required with sit to stand transfers, once standing pt unable to move RLE to attempt gait trial. Pt reports that she had increased LE spasms last night causing her LE to move randomly, since then moving her LE has been difficult.   Rehab Potential Fair   Clinical Impairments Affecting Rehab Potential extensive PMH   PT Treatment/Interventions ADLs/Self Care Home Management;Biofeedback;Canalith Repostioning;Electrical Stimulation;Neuromuscular re-education;Balance training;Therapeutic exercise;Manual techniques;Therapeutic activities;Functional mobility training;Gait training;DME Instruction;Orthotic Fit/Training;Patient/family education;Vestibular;Stair training;Passive range of motion;Energy conservation   PT Next Visit Plan  progress LE strength/gait /transfers       Patient will benefit from skilled therapeutic intervention in order to improve  the following deficits and impairments:  Abnormal gait, Decreased endurance, Impaired sensation, Decreased strength, Decreased knowledge of use of DME, Decreased balance, Decreased mobility, Decreased range of motion, Decreased coordination, Impaired flexibility, Postural dysfunction, Difficulty walking  Visit Diagnosis: Muscle weakness (generalized)  Difficulty in walking, not elsewhere classified  Unsteadiness on feet  SAH (subarachnoid hemorrhage) ([place])     Problem List Patient Active Problem List   Diagnosis Date Noted  . Urinary retention   . Acute pain of right knee   . Acute lower UTI   . Weakness of right lower extremity   . Confusion   . Hydrocephalus   . Poor appetite   . Acute blood loss anemia   . Post-operative pain   . Hypokalemia   . Leukocytosis   . Thrombocytopenia ([place])   . Acute deep vein thrombosis (DVT) of femoral vein of left lower extremity ([place])   . Myelopathy ([place]) 07/24/2016  . Thoracic myelopathy   . Depression   . Benign essential HTN   . History of subarachnoid hemorrhage   . Hypercoagulable state ([place])   . Constipation due to pain medication   . 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 Protein-calorie malnutrition ([place]) 12/21/2014  . Thyroid activity decreased 12/21/2014  . 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 Essential hypertension 12/13/2014  . Anemia, iron deficiency 12/13/2014  . Hypothyroidism 12/13/2014  . Prediabetes 12/13/2014    Scot Jun, PTA 03/28/2017, 11:09 AM  [place], [place], [address] Phone: [phone]   Fax:  [phone]  Name: Katrina Donaldson MRN: [id number] Date of Birth: 12/24/1955

## 2017-04-08 ENCOUNTER — Ambulatory Visit: Payer: BC Managed Care – PPO | Admitting: Physical Therapy

## 2017-04-10 ENCOUNTER — Ambulatory Visit: Payer: BC Managed Care – PPO | Admitting: Physical Therapy

## 2017-04-15 ENCOUNTER — Ambulatory Visit: Payer: BC Managed Care – PPO | Attending: Physical Medicine & Rehabilitation | Admitting: Physical Therapy

## 2017-04-15 DIAGNOSIS — R2681 Unsteadiness on feet: Secondary | ICD-10-CM | POA: Insufficient documentation

## 2017-04-15 DIAGNOSIS — R262 Difficulty in walking, not elsewhere classified: Secondary | ICD-10-CM | POA: Insufficient documentation

## 2017-04-15 DIAGNOSIS — M6281 Muscle weakness (generalized): Secondary | ICD-10-CM | POA: Insufficient documentation

## 2017-04-22 ENCOUNTER — Encounter: Payer: Self-pay | Admitting: Physical Therapy

## 2017-04-22 ENCOUNTER — Ambulatory Visit: Payer: BC Managed Care – PPO | Admitting: Physical Therapy

## 2017-04-22 ENCOUNTER — Encounter: Payer: BC Managed Care – PPO | Admitting: Physical Therapy

## 2017-04-22 DIAGNOSIS — R2681 Unsteadiness on feet: Secondary | ICD-10-CM | POA: Diagnosis present

## 2017-04-22 DIAGNOSIS — M6281 Muscle weakness (generalized): Secondary | ICD-10-CM

## 2017-04-22 DIAGNOSIS — R262 Difficulty in walking, not elsewhere classified: Secondary | ICD-10-CM | POA: Diagnosis present

## 2017-04-22 NOTE — Therapy (Signed)
[place], [place], [address] Phone: [phone]   Fax:  [phone]  Physical Therapy Treatment  Patient Details  Name: Katrina Donaldson MRN: [id number] Date of Birth: 1955-08-02 Referring Provider: Dr. Posey Pronto  Encounter Date: 04/22/2017      PT End of Session - 04/22/17 1314    Visit Number 9   Number of Visits 30   PT Start Time 1300   PT Stop Time 1400   PT Time Calculation (min) 60 min      Past Medical History:  Diagnosis Date  . Brain aneurysm   . Depression   . History of DVT (deep vein thrombosis)   . Hypercoagulable state ([place])   . Hypertension   . Hypothyroidism   . Stroke [facility]) 2016   [facility] 11/29/14    Past Surgical History:  Procedure Laterality Date  . ABDOMINAL HYSTERECTOMY     patient denies  . BRAIN SURGERY     aneurysm  . goiter    . LAMINECTOMY N/A 07/19/2016   Procedure: LAMINECTOMY THORACIC FOUR THORACIC FIVE, THORACIC SIX TO THORACIC EIGHT,THORACIC TEN TO LUMBAR ONE, FENESTRATION OF ARACHNOID CYSTS;  Surgeon: Consuella Lose, MD;  Location: [place];  Service: Neurosurgery;  Laterality: N/A;  . THYROIDECTOMY      There were no vitals filed for this visit.      Subjective Assessment - 04/22/17 1305    Subjective pt has not been here for 1 month d/t a variety of reasons/excuses. Pt verb huge declien without PT and now can not even get herself out of bed. c/o BIL spasms and RT still not wanting to work   Currently in Pain? Yes   Pain Score 4    Pain Location Back                         [facility] Adult PT Treatment/Exercise - 04/22/17 0001      Transfers   Transfers Squat Pivot Transfers   Sit to Stand 2: Max assist  max 1plus min- mod of 1   Stand to Sit 1: +2 Total assist  2 times less than 10 sec     Knee/Hip Exercises: Aerobic   Nustep L 3 8 min  legs tband to keep out of abd   Other Aerobic UBE 3 fwd/3 back L 3     Knee/Hip  Exercises: Seated   Long Arc Quad Both;2 sets;5 sets   Knee/Hip Flexion AA hip flex 5 times each   Other Seated Knee/Hip Exercises isometric abd/add 10 each                  PT Short Term Goals - 04/22/17 1312      PT SHORT TERM GOAL #2   Title Pt will perform sit to stand and stand pivot transfers with RW with min A    Baseline max A   Status Not Met     PT SHORT TERM GOAL #3   Title Pt will amb. 115' with RW with min A over level, indoor surfaces.   Baseline unable to  amb   Status Not Met     PT SHORT TERM GOAL #4   Title Improve TUG to <3 minutes with RW and min A   Baseline unable to complete test as can not walk 10 feet           PT Long Term Goals - 04/22/17 1313  PT LONG TERM GOAL #1   Title Pt will ambulate x >250' over level indoor surfaces with RW with supervision   Status Not Met     PT LONG TERM GOAL #2   Title Pt will perform sit <> stand and stand pivot transfers CGA with RW    Status Not Met     PT LONG TERM GOAL #3   Title Pt will demonstrate ability to release RW and maintain upright trunk posture to safely reach for items outside BOS with supervision x 8 reps   Status Not Met     PT LONG TERM GOAL #4   Title Pt will verbalize fall prevention strategies to reduce falls risk.    Status Partially Met               Plan - 04/22/17 1314    Clinical Impression Statement regression with all goals as pt has not been her in 1 month. pt is max of 2 to transfer and stand and unable to stand more than 5-10 sec. pt verb she has hired help at home and feel sshe is in a better place to make appts. trace LE strength   PT Treatment/Interventions ADLs/Self Care Home Management;Biofeedback;Canalith Repostioning;Electrical Stimulation;Neuromuscular re-education;Balance training;Therapeutic exercise;Manual techniques;Therapeutic activities;Functional mobility training;Gait training;DME Instruction;Orthotic Fit/Training;Patient/family  education;Vestibular;Stair training;Passive range of motion;Energy conservation   PT Next Visit Plan mat activities sitting and supine. Explained to pt in inconsistancy in attendance occurs again she will be D/C?      Patient will benefit from skilled therapeutic intervention in order to improve the following deficits and impairments:  Abnormal gait, Decreased endurance, Impaired sensation, Decreased strength, Decreased knowledge of use of DME, Decreased balance, Decreased mobility, Decreased range of motion, Decreased coordination, Impaired flexibility, Postural dysfunction, Difficulty walking  Visit Diagnosis: Muscle weakness (generalized)  Difficulty in walking, not elsewhere classified  Unsteadiness on feet     Problem List Patient Active Problem List   Diagnosis Date Noted  . Urinary retention   . Acute pain of right knee   . Acute lower UTI   . Weakness of right lower extremity   . Confusion   . Hydrocephalus   . Poor appetite   . Acute blood loss anemia   . Post-operative pain   . Hypokalemia   . Leukocytosis   . Thrombocytopenia ([place])   . Acute deep vein thrombosis (DVT) of femoral vein of left lower extremity ([place])   . Myelopathy ([place]) 07/24/2016  . Thoracic myelopathy   . Depression   . Benign essential HTN   . History of subarachnoid hemorrhage   . Hypercoagulable state ([place])   . Constipation due to pain medication   . 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 Protein-calorie malnutrition ([place]) 12/21/2014  . Thyroid activity decreased 12/21/2014  . 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 Essential hypertension 12/13/2014  . Anemia, iron deficiency 12/13/2014  . Hypothyroidism 12/13/2014  . Prediabetes 12/13/2014    PAYSEUR,ANGIE PTA 04/22/2017, 1:56 PM  [place] [address] [place] [address] [place], [place], [address] Phone: [phone]   Fax:  [phone]  Name:  Katrina Donaldson MRN: [id number] Date of Birth: 03-23-1956

## 2017-04-29 ENCOUNTER — Encounter: Payer: Self-pay | Admitting: Physical Therapy

## 2017-04-29 ENCOUNTER — Ambulatory Visit: Payer: BC Managed Care – PPO | Admitting: Physical Therapy

## 2017-04-29 DIAGNOSIS — M6281 Muscle weakness (generalized): Secondary | ICD-10-CM | POA: Diagnosis not present

## 2017-04-29 DIAGNOSIS — R262 Difficulty in walking, not elsewhere classified: Secondary | ICD-10-CM

## 2017-04-29 DIAGNOSIS — R2681 Unsteadiness on feet: Secondary | ICD-10-CM

## 2017-04-29 NOTE — Therapy (Signed)
[place], [place], [address] Phone: [phone]   Fax:  [phone]  Physical Therapy Treatment  Patient Details  Name: Katrina Donaldson MRN: [id number] Date of Birth: August 05, 1955 Referring Provider: Dr. Posey Pronto  Encounter Date: 04/29/2017      PT End of Session - 04/29/17 1355    Visit Number 10   Date for PT Re-Evaluation 05/23/17   PT Start Time 1300   PT Stop Time 1400   PT Time Calculation (min) 60 min      Past Medical History:  Diagnosis Date  . Brain aneurysm   . Depression   . History of DVT (deep vein thrombosis)   . Hypercoagulable state ([place])   . Hypertension   . Hypothyroidism   . Stroke [facility]) 2016   [facility] 11/29/14    Past Surgical History:  Procedure Laterality Date  . ABDOMINAL HYSTERECTOMY     patient denies  . BRAIN SURGERY     aneurysm  . goiter    . LAMINECTOMY N/A 07/19/2016   Procedure: LAMINECTOMY THORACIC FOUR THORACIC FIVE, THORACIC SIX TO THORACIC EIGHT,THORACIC TEN TO LUMBAR ONE, FENESTRATION OF ARACHNOID CYSTS;  Surgeon: Consuella Lose, MD;  Location: [place];  Service: Neurosurgery;  Laterality: N/A;  . THYROIDECTOMY      There were no vitals filed for this visit.      Subjective Assessment - 04/29/17 1339    Subjective not sure CG can stay with me or not? I need help with everything   Currently in Pain? No/denies                         [facility] Adult PT Treatment/Exercise - 04/29/17 0001      Bed Mobility   Bed Mobility Sit to Supine;Supine to Sit  max A for LE     Transfers   Transfers Lateral/Scoot Transfers  slide board- min - mod A depending on surface   Sit to Stand 2: Max assist  +2 unble ot bear wt thru LE     Knee/Hip Exercises: Aerobic   Nustep L 4 8 min  green tband to wrap thighs to prevent abd     Knee/Hip Exercises: Seated   Other Seated Knee/Hip Exercises core stab with wt ball, func reaching and ball toss -  min/mod A     Knee/Hip Exercises: Supine   Other Supine Knee/Hip Exercises bridge with and without ball, KTC, obl. hip abd with slide board, heel slide with slide board. ALL with assistance                  PT Short Term Goals - 04/22/17 1312      PT SHORT TERM GOAL #2   Title Pt will perform sit to stand and stand pivot transfers with RW with min A    Baseline max A   Status Not Met     PT SHORT TERM GOAL #3   Title Pt will amb. 115' with RW with min A over level, indoor surfaces.   Baseline unable to  amb   Status Not Met     PT SHORT TERM GOAL #4   Title Improve TUG to <3 minutes with RW and min A   Baseline unable to complete test as can not walk 10 feet           PT Long Term Goals - 04/22/17 1313      PT  LONG TERM GOAL #1   Title Pt will ambulate x >250' over level indoor surfaces with RW with supervision   Status Not Met     PT LONG TERM GOAL #2   Title Pt will perform sit <> stand and stand pivot transfers CGA with RW    Status Not Met     PT LONG TERM GOAL #3   Title Pt will demonstrate ability to release RW and maintain upright trunk posture to safely reach for items outside BOS with supervision x 8 reps   Status Not Met     PT LONG TERM GOAL #4   Title Pt will verbalize fall prevention strategies to reduce falls risk.    Status Partially Met               Plan - 04/29/17 1355    Clinical Impression Statement supine ther ex with assistance, dynamic seated ex and transfers with SB. attempted standning but  unable to bear wt   PT Next Visit Plan mat ex supine nad seated. nustep for strength and endurance. stand as able      Patient will benefit from skilled therapeutic intervention in order to improve the following deficits and impairments:  Abnormal gait, Decreased endurance, Impaired sensation, Decreased strength, Decreased knowledge of use of DME, Decreased balance, Decreased mobility, Decreased range of motion, Decreased coordination,  Impaired flexibility, Postural dysfunction, Difficulty walking  Visit Diagnosis: Muscle weakness (generalized)  Difficulty in walking, not elsewhere classified  Unsteadiness on feet     Problem List Patient Active Problem List   Diagnosis Date Noted  . Urinary retention   . Acute pain of right knee   . Acute lower UTI   . Weakness of right lower extremity   . Confusion   . Hydrocephalus   . Poor appetite   . Acute blood loss anemia   . Post-operative pain   . Hypokalemia   . Leukocytosis   . Thrombocytopenia ([place])   . Acute deep vein thrombosis (DVT) of femoral vein of left lower extremity ([place])   . Myelopathy ([place]) 07/24/2016  . Thoracic myelopathy   . Depression   . Benign essential HTN   . History of subarachnoid hemorrhage   . Hypercoagulable state ([place])   . Constipation due to pain medication   . 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 Protein-calorie malnutrition ([place]) 12/21/2014  . Thyroid activity decreased 12/21/2014  . 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 Essential hypertension 12/13/2014  . Anemia, iron deficiency 12/13/2014  . Hypothyroidism 12/13/2014  . Prediabetes 12/13/2014    Willford Rabideau,ANGIE PTA 04/29/2017, 1:57 PM  [place], [place], [address] Phone: [phone]   Fax:  [phone]  Name: Katrina Donaldson MRN: [id number] Date of Birth: 11/02/1955

## 2017-05-01 ENCOUNTER — Encounter: Payer: Self-pay | Admitting: Physical Therapy

## 2017-05-01 ENCOUNTER — Ambulatory Visit: Payer: BC Managed Care – PPO | Attending: Physical Medicine & Rehabilitation | Admitting: Physical Therapy

## 2017-05-01 DIAGNOSIS — R2681 Unsteadiness on feet: Secondary | ICD-10-CM | POA: Diagnosis present

## 2017-05-01 DIAGNOSIS — M6281 Muscle weakness (generalized): Secondary | ICD-10-CM | POA: Diagnosis present

## 2017-05-01 DIAGNOSIS — R262 Difficulty in walking, not elsewhere classified: Secondary | ICD-10-CM

## 2017-05-01 DIAGNOSIS — I609 Nontraumatic subarachnoid hemorrhage, unspecified: Secondary | ICD-10-CM | POA: Diagnosis present

## 2017-05-01 NOTE — Therapy (Signed)
[place], [place], [address] Phone: [phone]   Fax:  [phone]  Physical Therapy Treatment  Patient Details  Name: Katrina Donaldson MRN: [id number] Date of Birth: [birth year] Referring Provider: Dr. Posey Pronto  Encounter Date: 05/01/2017      PT End of Session - 05/01/17 1352    Visit Number 11   Date for PT Re-Evaluation 05/23/17   Authorization Type [employer] State, [place] code and PN every 10th visit   PT Start Time 1300   PT Stop Time 1342   PT Time Calculation (min) 42 min   Activity Tolerance Patient tolerated treatment well;Patient limited by fatigue   Behavior During Therapy [facility] for tasks assessed/performed      Past Medical History:  Diagnosis Date  . Brain aneurysm   . Depression   . History of DVT (deep vein thrombosis)   . Hypercoagulable state ([place])   . Hypertension   . Hypothyroidism   . Stroke [facility]) 2016   [facility] 11/29/14    Past Surgical History:  Procedure Laterality Date  . ABDOMINAL HYSTERECTOMY     patient denies  . BRAIN SURGERY     aneurysm  . goiter    . LAMINECTOMY N/A 07/19/2016   Procedure: LAMINECTOMY THORACIC FOUR THORACIC FIVE, THORACIC SIX TO THORACIC EIGHT,THORACIC TEN TO LUMBAR ONE, FENESTRATION OF ARACHNOID CYSTS;  Surgeon: Consuella Lose, MD;  Location: [place];  Service: Neurosurgery;  Laterality: N/A;  . THYROIDECTOMY      There were no vitals filed for this visit.      Subjective Assessment - 05/01/17 1302    Subjective "My legs feel like noodles"   Currently in Pain? No/denies   Pain Score 0-No pain                         [facility] Adult PT Treatment/Exercise - 05/01/17 0001      Knee/Hip Exercises: Aerobic   Nustep L 4 8 min     Knee/Hip Exercises: Machines for Strengthening   Other Machine fitter pressed 1 blue     Knee/Hip Exercises: Seated   Other Seated Knee/Hip Exercises core stab with wt ball, func reaching       Shoulder Exercises: Seated   Other Seated Exercises Single arm Row Green tband 2 x10 each, Doublw arm Row                   PT Short Term Goals - 04/22/17 1312      PT SHORT TERM GOAL #2   Title Pt will perform sit to stand and stand pivot transfers with RW with min A    Baseline max A   Status Not Met     PT SHORT TERM GOAL #3   Title Pt will amb. 115' with RW with min A over level, indoor surfaces.   Baseline unable to  amb   Status Not Met     PT SHORT TERM GOAL #4   Title Improve TUG to <3 minutes with RW and min A   Baseline unable to complete test as can not walk 10 feet           PT Long Term Goals - 04/22/17 1313      PT LONG TERM GOAL #1   Title Pt will ambulate x >250' over level indoor surfaces with RW with supervision   Status Not Met     PT LONG TERM  GOAL #2   Title Pt will perform sit <> stand and stand pivot transfers CGA with RW    Status Not Met     PT LONG TERM GOAL #3   Title Pt will demonstrate ability to release RW and maintain upright trunk posture to safely reach for items outside BOS with supervision x 8 reps   Status Not Met     PT LONG TERM GOAL #4   Title Pt will verbalize fall prevention strategies to reduce falls risk.    Status Partially Met               Plan - 05/01/17 1352    Clinical Impression Statement Pt with very little use of her LE, Max assist needed for all transfers. Some trunk instability sitting without back rest.   Rehab Potential Fair   Clinical Impairments Affecting Rehab Potential extensive PMH   PT Duration 8 weeks   PT Treatment/Interventions ADLs/Self Care Home Management;Biofeedback;Canalith Repostioning;Electrical Stimulation;Neuromuscular re-education;Balance training;Therapeutic exercise;Manual techniques;Therapeutic activities;Functional mobility training;Gait training;DME Instruction;Orthotic Fit/Training;Patient/family education;Vestibular;Stair training;Passive range of motion;Energy  conservation   PT Next Visit Plan mat ex supine nad seated. nustep for strength and endurance. stand as able      Patient will benefit from skilled therapeutic intervention in order to improve the following deficits and impairments:  Abnormal gait, Decreased endurance, Impaired sensation, Decreased strength, Decreased knowledge of use of DME, Decreased balance, Decreased mobility, Decreased range of motion, Decreased coordination, Impaired flexibility, Postural dysfunction, Difficulty walking  Visit Diagnosis: Difficulty in walking, not elsewhere classified  Muscle weakness (generalized)  Unsteadiness on feet  SAH (subarachnoid hemorrhage) ([place])     Problem List Patient Active Problem List   Diagnosis Date Noted  . Urinary retention   . Acute pain of right knee   . Acute lower UTI   . Weakness of right lower extremity   . Confusion   . Hydrocephalus   . Poor appetite   . Acute blood loss anemia   . Post-operative pain   . Hypokalemia   . Leukocytosis   . Thrombocytopenia ([place])   . Acute deep vein thrombosis (DVT) of femoral vein of left lower extremity ([place])   . Myelopathy ([place]) 07/24/2016  . Thoracic myelopathy   . Depression   . Benign essential HTN   . History of subarachnoid hemorrhage   . Hypercoagulable state ([place])   . Constipation due to pain medication   . 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 Protein-calorie malnutrition ([place]) 12/21/2014  . Thyroid activity decreased 12/21/2014  . 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 Essential hypertension 12/13/2014  . Anemia, iron deficiency 12/13/2014  . Hypothyroidism 12/13/2014  . Prediabetes 12/13/2014    Scot Jun, PTA 05/01/2017, 1:54 PM  [place] [address] [place], [place], [address] Phone: [phone]   Fax:  [phone]  Name: Katrina Donaldson MRN: [id number] Date of Birth:  06/23/1956

## 2017-05-05 ENCOUNTER — Ambulatory Visit: Payer: BC Managed Care – PPO | Admitting: Oncology

## 2017-05-06 ENCOUNTER — Ambulatory Visit: Payer: BC Managed Care – PPO | Admitting: Physical Therapy

## 2017-05-06 ENCOUNTER — Encounter: Payer: Self-pay | Admitting: Physical Therapy

## 2017-05-06 DIAGNOSIS — R262 Difficulty in walking, not elsewhere classified: Secondary | ICD-10-CM | POA: Diagnosis not present

## 2017-05-06 NOTE — Therapy (Signed)
[place], [place], [address] Phone: [phone]   Fax:  [phone]  Physical Therapy Treatment  Patient Details  Name: Katrina Donaldson MRN: [id number] Date of Birth: 05/27/1956 Referring Provider: Dr. Posey Pronto   Encounter Date: 05/06/2017  PT End of Session - 05/06/17 1429    Visit Number  12    Date for PT Re-Evaluation  05/23/17    PT Start Time  1345    PT Stop Time  1429    PT Time Calculation (min)  44 min    Activity Tolerance  Patient tolerated treatment well;Patient limited by fatigue    Behavior During Therapy  [facility] for tasks assessed/performed       Past Medical History:  Diagnosis Date  . Brain aneurysm   . Depression   . History of DVT (deep vein thrombosis)   . Hypercoagulable state ([place])   . Hypertension   . Hypothyroidism   . Stroke [facility]) 2016   [facility] 11/29/14    Past Surgical History:  Procedure Laterality Date  . ABDOMINAL HYSTERECTOMY     patient denies  . BRAIN SURGERY     aneurysm  . goiter    . THYROIDECTOMY      There were no vitals filed for this visit.  Subjective Assessment - 05/06/17 1355    Subjective  "you worked me a lot last time, I was aching all over"    Currently in Pain?  No/denies    Pain Score  0-No pain                      [facility] Adult PT Treatment/Exercise - 05/06/17 0001      Exercises   Exercises  Wrist;Lumbar      Lumbar Exercises: Supine   Other Supine Lumbar Exercises  LE trunk rotations    Other Supine Lumbar Exercises  UE Trunk rotations with red ball       Knee/Hip Exercises: Seated   Other Seated Knee/Hip Exercises  core stab with wt ball, func reaching       Shoulder Exercises: Seated   Other Seated Exercises  biceps curls 3lb x10, x5    Other Seated Exercises  Single arm Row Green tband 2 x10 each               PT Short Term Goals - 04/22/17 1312      PT SHORT TERM GOAL #2   Title  Pt will  perform sit to stand and stand pivot transfers with RW with min A     Baseline  max A    Status  Not Met      PT SHORT TERM GOAL #3   Title  Pt will amb. 115' with RW with min A over level, indoor surfaces.    Baseline  unable to  amb    Status  Not Met      PT SHORT TERM GOAL #4   Title  Improve TUG to <3 minutes with RW and min A    Baseline  unable to complete test as can not walk 10 feet        PT Long Term Goals - 05/06/17 1431      PT LONG TERM GOAL #2   Title  Pt will perform sit <> stand and stand pivot transfers CGA with RW     Status  Not Met      PT LONG TERM  GOAL #3   Status  Not Met            Plan - 05/06/17 1429    Clinical Impression Statement  Pt becomes very fatigues with sitting balance. Min to mod assist needed with sliding board transfer.     Rehab Potential  Fair    PT Frequency  2x / week    PT Duration  8 weeks    PT Treatment/Interventions  ADLs/Self Care Home Management;Biofeedback;Canalith Repostioning;Electrical Stimulation;Neuromuscular re-education;Balance training;Therapeutic exercise;Manual techniques;Therapeutic activities;Functional mobility training;Gait training;DME Instruction;Orthotic Fit/Training;Patient/family education;Vestibular;Stair training;Passive range of motion;Energy conservation    PT Next Visit Plan  mat ex supine nad seated. nustep for strength and endurance. stand as able       Patient will benefit from skilled therapeutic intervention in order to improve the following deficits and impairments:  Abnormal gait, Decreased endurance, Impaired sensation, Decreased strength, Decreased knowledge of use of DME, Decreased balance, Decreased mobility, Decreased range of motion, Decreased coordination, Impaired flexibility, Postural dysfunction, Difficulty walking  Visit Diagnosis: Difficulty in walking, not elsewhere classified     Problem List Patient Active Problem List   Diagnosis Date Noted  . Urinary retention   .  Acute pain of right knee   . Acute lower UTI   . Weakness of right lower extremity   . Confusion   . Hydrocephalus   . Poor appetite   . Acute blood loss anemia   . Post-operative pain   . Hypokalemia   . Leukocytosis   . Thrombocytopenia ([place])   . Acute deep vein thrombosis (DVT) of femoral vein of left lower extremity ([place])   . Myelopathy ([place]) 07/24/2016  . Thoracic myelopathy   . Depression   . Benign essential HTN   . History of subarachnoid hemorrhage   . Hypercoagulable state ([place])   . Constipation due to pain medication   . 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 Protein-calorie malnutrition ([place]) 12/21/2014  . Thyroid activity decreased 12/21/2014  . 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 Essential hypertension 12/13/2014  . Anemia, iron deficiency 12/13/2014  . Hypothyroidism 12/13/2014  . Prediabetes 12/13/2014    Scot Jun, PTA 05/06/2017, 2:35 PM  [place] [address] [place], [place], [address] Phone: [phone]   Fax:  [phone]  Name: Katrina Donaldson MRN: [id number] Date of Birth: 06-01-1956

## 2017-05-08 ENCOUNTER — Encounter: Payer: Self-pay | Admitting: Physical Therapy

## 2017-05-08 ENCOUNTER — Ambulatory Visit: Payer: BC Managed Care – PPO | Admitting: Physical Therapy

## 2017-05-08 DIAGNOSIS — M6281 Muscle weakness (generalized): Secondary | ICD-10-CM

## 2017-05-08 DIAGNOSIS — R262 Difficulty in walking, not elsewhere classified: Secondary | ICD-10-CM

## 2017-05-08 DIAGNOSIS — R2681 Unsteadiness on feet: Secondary | ICD-10-CM

## 2017-05-08 DIAGNOSIS — I609 Nontraumatic subarachnoid hemorrhage, unspecified: Secondary | ICD-10-CM

## 2017-05-08 NOTE — Therapy (Signed)
[place], [place], [address] Phone: [phone]   Fax:  [phone]  Physical Therapy Treatment  Patient Details  Name: Katrina Donaldson MRN: [id number] Date of Birth: Nov 13, 1955 Referring Provider: Dr. Posey Pronto   Encounter Date: 05/08/2017  PT End of Session - 05/08/17 1514    Visit Number  13    Number of Visits  30    Date for PT Re-Evaluation  05/23/17    PT Start Time  1400    PT Stop Time  1500    PT Time Calculation (min)  60 min       Past Medical History:  Diagnosis Date  . Brain aneurysm   . Depression   . History of DVT (deep vein thrombosis)   . Hypercoagulable state ([place])   . Hypertension   . Hypothyroidism   . Stroke [facility]) 2016   [facility] 11/29/14    Past Surgical History:  Procedure Laterality Date  . ABDOMINAL HYSTERECTOMY     patient denies  . BRAIN SURGERY     aneurysm  . goiter    . THYROIDECTOMY      There were no vitals filed for this visit.  Subjective Assessment - 05/08/17 1508    Subjective  i am seeeing neuro surgeon monday- may have surgery    Currently in Pain?  No/denies                      [facility] Adult PT Treatment/Exercise - 05/08/17 0001      Transfers   Transfers  Lateral/Scoot Transfers slide board- mod A + 1 and min A +1    Sit to Stand  3: Mod assist +2    Sit to Stand Details  Tactile cues for weight beaing    Sit to Stand Details (indicate cue type and reason)  stood with RW 30 sec 3 times       Lumbar Exercises: Aerobic   UBE (Upper Arm Bike)  L 3 3 fwd/3 back      Knee/Hip Exercises: Aerobic   Nustep  L 4 10 min      Knee/Hip Exercises: Seated   Other Seated Knee/Hip Exercises  core stab with func reaching    Other Seated Knee/Hip Exercises  AAROM LE               PT Short Term Goals - 05/08/17 1513      PT SHORT TERM GOAL #2   Title  Pt will perform sit to stand and stand pivot transfers with RW with min A      Status  On-going        PT Long Term Goals - 05/08/17 1513      PT LONG TERM GOAL #1   Title  Pt will ambulate x >250' over level indoor surfaces with RW with supervision    Status  Not Met      PT LONG TERM GOAL #2   Title  Pt will perform sit <> stand and stand pivot transfers CGA with RW       PT LONG TERM GOAL #3   Status  On-going      PT LONG TERM GOAL #4   Title  Pt will verbalize fall prevention strategies to reduce falls risk.     Status  Achieved      PT LONG TERM GOAL #5   Title  Pt will improve safety with gait  as indicated by TUG decreased to 150 seconds with CGA    Status  Not Met            Plan - 05/08/17 1514    Clinical Impression Statement  no goal progress made but pt has been 100% compliant with appoints since renewal and improvement with slide board transfers afre set up and able ot stand today and bear wt on LE with tractile cuing which sh ecould not do 2 weeks ago    PT Treatment/Interventions  ADLs/Self Care Home Management;Biofeedback;Canalith Repostioning;Electrical Stimulation;Neuromuscular re-education;Balance training;Therapeutic exercise;Manual techniques;Therapeutic activities;Functional mobility training;Gait training;DME Instruction;Orthotic Fit/Training;Patient/family education;Vestibular;Stair training;Passive range of motion;Energy conservation    PT Next Visit Plan  pt seein gsurgeon Monday, assess and progress       Patient will benefit from skilled therapeutic intervention in order to improve the following deficits and impairments:  Abnormal gait, Decreased endurance, Impaired sensation, Decreased strength, Decreased knowledge of use of DME, Decreased balance, Decreased mobility, Decreased range of motion, Decreased coordination, Impaired flexibility, Postural dysfunction, Difficulty walking  Visit Diagnosis: Difficulty in walking, not elsewhere classified  Muscle weakness (generalized)  Unsteadiness on feet  SAH  (subarachnoid hemorrhage) ([place])     Problem List Patient Active Problem List   Diagnosis Date Noted  . Urinary retention   . Acute pain of right knee   . Acute lower UTI   . Weakness of right lower extremity   . Confusion   . Hydrocephalus   . Poor appetite   . Acute blood loss anemia   . Post-operative pain   . Hypokalemia   . Leukocytosis   . Thrombocytopenia ([place])   . Acute deep vein thrombosis (DVT) of femoral vein of left lower extremity ([place])   . Myelopathy ([place]) 07/24/2016  . Thoracic myelopathy   . Depression   . Benign essential HTN   . History of subarachnoid hemorrhage   . Hypercoagulable state ([place])   . Constipation due to pain medication   . 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 Protein-calorie malnutrition ([place]) 12/21/2014  . Thyroid activity decreased 12/21/2014  . 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 Essential hypertension 12/13/2014  . Anemia, iron deficiency 12/13/2014  . Hypothyroidism 12/13/2014  . Prediabetes 12/13/2014    Viha Kriegel,ANGIE PTA 05/08/2017, 3:17 PM  [place] [address] [place], [place], [address] Phone: [phone]   Fax:  [phone]  Name: Katrina Donaldson MRN: [id number] Date of Birth: 19-Jan-1956

## 2017-05-13 ENCOUNTER — Encounter: Payer: Self-pay | Admitting: Physical Therapy

## 2017-05-13 ENCOUNTER — Ambulatory Visit: Payer: BC Managed Care – PPO | Admitting: Physical Therapy

## 2017-05-13 DIAGNOSIS — M6281 Muscle weakness (generalized): Secondary | ICD-10-CM

## 2017-05-13 DIAGNOSIS — R262 Difficulty in walking, not elsewhere classified: Secondary | ICD-10-CM | POA: Diagnosis not present

## 2017-05-13 DIAGNOSIS — R2681 Unsteadiness on feet: Secondary | ICD-10-CM

## 2017-05-13 DIAGNOSIS — I609 Nontraumatic subarachnoid hemorrhage, unspecified: Secondary | ICD-10-CM

## 2017-05-13 NOTE — Therapy (Signed)
[place], [place], [address] Phone: [phone]   Fax:  [phone]  Physical Therapy Treatment  Patient Details  Name: Katrina Donaldson MRN: [id number] Date of Birth: 1956/06/19 Referring Provider: Dr. Posey Pronto   Encounter Date: 05/13/2017  PT End of Session - 05/13/17 1540    Visit Number  14    Date for PT Re-Evaluation  05/23/17    PT Start Time  1450    PT Stop Time  1540    PT Time Calculation (min)  50 min    Equipment Utilized During Treatment  Gait belt    Behavior During Therapy  [facility] for tasks assessed/performed       Past Medical History:  Diagnosis Date  . Brain aneurysm   . Depression   . History of DVT (deep vein thrombosis)   . Hypercoagulable state ([place])   . Hypertension   . Hypothyroidism   . Stroke [facility]) 2016   [facility] 11/29/14    Past Surgical History:  Procedure Laterality Date  . ABDOMINAL HYSTERECTOMY     patient denies  . BRAIN SURGERY     aneurysm  . goiter    . THYROIDECTOMY      There were no vitals filed for this visit.  Subjective Assessment - 05/13/17 1444    Subjective  "ok, except thinking about what I should do"    Currently in Pain?  Yes    Pain Score  5     Pain Location  Elbow    Pain Orientation  Left                      [facility] Adult PT Treatment/Exercise - 05/13/17 0001      Transfers   Transfers  Lateral/Scoot Transfers min +2      Lumbar Exercises: Aerobic   UBE (Upper Arm Bike)  L 3 3 fwd/3 back      Lumbar Exercises: Standing   Other Standing Lumbar Exercises  seated single arm rows green 2x15      Knee/Hip Exercises: Aerobic   Nustep  L 4 L7 min      Knee/Hip Exercises: Seated   Long Arc Quad  Both;1 set;AROM 7 reps, assist needed to lift feet from floor    Other Seated Knee/Hip Exercises  core stab with func reaching      Shoulder Exercises: Seated   Other Seated Exercises  shoulder ext red 2x10     Other  Seated Exercises  bicep curls 3lb 2x10                PT Short Term Goals - 05/08/17 1513      PT SHORT TERM GOAL #2   Title  Pt will perform sit to stand and stand pivot transfers with RW with min A     Status  On-going        PT Long Term Goals - 05/08/17 1513      PT LONG TERM GOAL #1   Title  Pt will ambulate x >250' over level indoor surfaces with RW with supervision    Status  Not Met      PT LONG TERM GOAL #2   Title  Pt will perform sit <> stand and stand pivot transfers CGA with RW       PT LONG TERM GOAL #3   Status  On-going      PT LONG TERM GOAL #4  Title  Pt will verbalize fall prevention strategies to reduce falls risk.     Status  Achieved      PT LONG TERM GOAL #5   Title  Pt will improve safety with gait as indicated by TUG decreased to 150 seconds with CGA    Status  Not Met            Plan - 05/13/17 1548    Clinical Impression Statement  Pt has not progressed towards goals. Pt with very little use of LE. Pt with little trunk control with seated rows and functional reaches. Pt reports that's she did not want to stand today's because she has had some spasms at night keeping her up at night.    Clinical Impairments Affecting Rehab Potential  extensive PMH    PT Frequency  2x / week    PT Duration  8 weeks    PT Treatment/Interventions  ADLs/Self Care Home Management;Biofeedback;Canalith Repostioning;Electrical Stimulation;Neuromuscular re-education;Balance training;Therapeutic exercise;Manual techniques;Therapeutic activities;Functional mobility training;Gait training;DME Instruction;Orthotic Fit/Training;Patient/family education;Vestibular;Stair training;Passive range of motion;Energy conservation    PT Next Visit Plan  pt reports that she is still deciding to have surgery, assess and progress       Patient will benefit from skilled therapeutic intervention in order to improve the following deficits and impairments:  Abnormal gait,  Decreased endurance, Impaired sensation, Decreased strength, Decreased knowledge of use of DME, Decreased balance, Decreased mobility, Decreased range of motion, Decreased coordination, Impaired flexibility, Postural dysfunction, Difficulty walking  Visit Diagnosis: Difficulty in walking, not elsewhere classified  Muscle weakness (generalized)  Unsteadiness on feet  SAH (subarachnoid hemorrhage) ([place])     Problem List Patient Active Problem List   Diagnosis Date Noted  . Urinary retention   . Acute pain of right knee   . Acute lower UTI   . Weakness of right lower extremity   . Confusion   . Hydrocephalus   . Poor appetite   . Acute blood loss anemia   . Post-operative pain   . Hypokalemia   . Leukocytosis   . Thrombocytopenia ([place])   . Acute deep vein thrombosis (DVT) of femoral vein of left lower extremity ([place])   . Myelopathy ([place]) 07/24/2016  . Thoracic myelopathy   . Depression   . Benign essential HTN   . History of subarachnoid hemorrhage   . Hypercoagulable state ([place])   . Constipation due to pain medication   . 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 Protein-calorie malnutrition ([place]) 12/21/2014  . Thyroid activity decreased 12/21/2014  . 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 Essential hypertension 12/13/2014  . Anemia, iron deficiency 12/13/2014  . Hypothyroidism 12/13/2014  . Prediabetes 12/13/2014    Scot Jun, PTA 05/13/2017, 3:52 PM  [place] [address] [place], [place], [address] Phone: [phone]   Fax:  [phone]  Name: Katrina Donaldson MRN: [id number] Date of Birth: 01-Mar-1956

## 2017-05-14 ENCOUNTER — Other Ambulatory Visit (HOSPITAL_COMMUNITY): Payer: Self-pay | Admitting: Neurosurgery

## 2017-05-14 DIAGNOSIS — G96198 Other disorders of meninges, not elsewhere classified: Secondary | ICD-10-CM

## 2017-05-14 DIAGNOSIS — G9619 Other disorders of meninges, not elsewhere classified: Principal | ICD-10-CM

## 2017-05-15 ENCOUNTER — Ambulatory Visit: Payer: BC Managed Care – PPO | Admitting: Physical Therapy

## 2017-05-15 ENCOUNTER — Encounter: Payer: Self-pay | Admitting: Physical Therapy

## 2017-05-15 DIAGNOSIS — M6281 Muscle weakness (generalized): Secondary | ICD-10-CM

## 2017-05-15 DIAGNOSIS — R2681 Unsteadiness on feet: Secondary | ICD-10-CM

## 2017-05-15 DIAGNOSIS — R262 Difficulty in walking, not elsewhere classified: Secondary | ICD-10-CM

## 2017-05-15 NOTE — Therapy (Signed)
[place], [place], [address] Phone: [phone]   Fax:  [phone]  Physical Therapy Treatment  Patient Details  Name: Katrina Donaldson MRN: [id number] Date of Birth: 1956-04-30 Referring Provider: Dr. Posey Pronto   Encounter Date: 05/15/2017  PT End of Session - 05/15/17 1553    Visit Number  15    Date for PT Re-Evaluation  05/23/17    Authorization Type  [employer] [place], [place] code and PN every 10th visit    PT Start Time  1515    PT Stop Time  1605    PT Time Calculation (min)  50 min       Past Medical History:  Diagnosis Date  . Brain aneurysm   . Depression   . History of DVT (deep vein thrombosis)   . Hypercoagulable state ([place])   . Hypertension   . Hypothyroidism   . Stroke [facility]) 2016   [facility] 11/29/14    Past Surgical History:  Procedure Laterality Date  . ABDOMINAL HYSTERECTOMY     patient denies  . BRAIN SURGERY     aneurysm  . goiter    . LAMINECTOMY N/A 07/19/2016   Procedure: LAMINECTOMY THORACIC FOUR THORACIC FIVE, THORACIC SIX TO THORACIC EIGHT,THORACIC TEN TO LUMBAR ONE, FENESTRATION OF ARACHNOID CYSTS;  Surgeon: Consuella Lose, MD;  Location: [facility];  Service: Neurosurgery;  Laterality: N/A;  . THYROIDECTOMY      There were no vitals filed for this visit.  Subjective Assessment - 05/15/17 1524    Subjective  shlds are sore and legs are still spasming    Currently in Pain?  Yes    Pain Score  5                       [facility] Adult PT Treatment/Exercise - 05/15/17 0001      Transfers   Transfers  Lateral/Scoot Transfers    Sit to Stand  2: Max assist    Sit to Stand Details (indicate cue type and reason)  stood with RW plus 2 mod A , assistance to stab RT knee. Heavy use of UE for pt 1 min 2 times      Lumbar Exercises: Seated   Other Seated Lumbar Exercises  red tband trunk flex/ext and obl      Knee/Hip Exercises: Aerobic   Nustep  L 4 8 min       Shoulder Exercises: Seated   Other Seated Exercises  2# flex,chest press,abd horz add    Other Seated Exercises  2# func reaching for UE and core               PT Short Term Goals - 05/08/17 1513      PT SHORT TERM GOAL #2   Title  Pt will perform sit to stand and stand pivot transfers with RW with min A     Status  On-going        PT Long Term Goals - 05/08/17 1513      PT LONG TERM GOAL #1   Title  Pt will ambulate x >250' over level indoor surfaces with RW with supervision    Status  Not Met      PT LONG TERM GOAL #2   Title  Pt will perform sit <> stand and stand pivot transfers CGA with RW       PT LONG TERM GOAL #3   Status  On-going  PT LONG TERM GOAL #4   Title  Pt will verbalize fall prevention strategies to reduce falls risk.     Status  Achieved      PT LONG TERM GOAL #5   Title  Pt will improve safety with gait as indicated by TUG decreased to 150 seconds with CGA    Status  Not Met            Plan - 05/15/17 1553    Clinical Impression Statement  focus on transfers, strength of extremities and core. standing but unable to amb. pt verb having surgery possibly as early as 12/5 then hopefully rehab. Pt making slow progress with goals    PT Treatment/Interventions  ADLs/Self Care Home Management;Biofeedback;Canalith Repostioning;Electrical Stimulation;Neuromuscular re-education;Balance training;Therapeutic exercise;Manual techniques;Therapeutic activities;Functional mobility training;Gait training;DME Instruction;Orthotic Fit/Training;Patient/family education;Vestibular;Stair training;Passive range of motion;Energy conservation    PT Next Visit Plan  progress as tolerated, possible 12/5 surgery       Patient will benefit from skilled therapeutic intervention in order to improve the following deficits and impairments:  Abnormal gait, Decreased endurance, Impaired sensation, Decreased strength, Decreased knowledge of use of DME, Decreased  balance, Decreased mobility, Decreased range of motion, Decreased coordination, Impaired flexibility, Postural dysfunction, Difficulty walking  Visit Diagnosis: Difficulty in walking, not elsewhere classified  Muscle weakness (generalized)  Unsteadiness on feet     Problem List Patient Active Problem List   Diagnosis Date Noted  . Urinary retention   . Acute pain of right knee   . Acute lower UTI   . Weakness of right lower extremity   . Confusion   . Hydrocephalus   . Poor appetite   . Acute blood loss anemia   . Post-operative pain   . Hypokalemia   . Leukocytosis   . Thrombocytopenia ([place])   . Acute deep vein thrombosis (DVT) of femoral vein of left lower extremity ([place])   . Myelopathy ([place]) 07/24/2016  . Thoracic myelopathy   . Depression   . Benign essential HTN   . History of subarachnoid hemorrhage   . Hypercoagulable state ([place])   . Constipation due to pain medication   . 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 Protein-calorie malnutrition ([place]) 12/21/2014  . Thyroid activity decreased 12/21/2014  . 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 Essential hypertension 12/13/2014  . Anemia, iron deficiency 12/13/2014  . Hypothyroidism 12/13/2014  . Prediabetes 12/13/2014    PAYSEUR,ANGIE PTA 05/15/2017, 3:55 PM  [place] [address] [place], [place], [address] Phone: [phone]   Fax:  [phone]  Name: Emony Dormer MRN: [id number] Date of Birth: [birth year]

## 2017-05-19 ENCOUNTER — Ambulatory Visit: Payer: BC Managed Care – PPO | Admitting: Physical Therapy

## 2017-05-19 ENCOUNTER — Encounter: Payer: Self-pay | Admitting: Physical Therapy

## 2017-05-19 DIAGNOSIS — R262 Difficulty in walking, not elsewhere classified: Secondary | ICD-10-CM

## 2017-05-19 DIAGNOSIS — M6281 Muscle weakness (generalized): Secondary | ICD-10-CM

## 2017-05-19 NOTE — Therapy (Addendum)
[facility] [place], [place], [address] Phone: [phone]   Fax:  [phone]  Physical Therapy Treatment  Patient Details  Name: Katrina Donaldson MRN: [id number] Date of Birth: 12-22-1955 Referring Provider: Dr. Posey Pronto   Encounter Date: 05/19/2017  PT End of Session - 05/19/17 1515    Visit Number  16    Number of Visits  30    Date for PT Re-Evaluation  05/23/17    PT Start Time  1430    PT Stop Time  1515    PT Time Calculation (min)  45 min       Past Medical History:  Diagnosis Date  . Brain aneurysm   . Depression   . History of DVT (deep vein thrombosis)   . Hypercoagulable state ([place])   . Hypertension   . Hypothyroidism   . Stroke [facility]) 2016   [facility] 11/29/14    Past Surgical History:  Procedure Laterality Date  . ABDOMINAL HYSTERECTOMY     patient denies  . BRAIN SURGERY     aneurysm  . goiter    . LAMINECTOMY THORACIC FOUR THORACIC FIVE, THORACIC SIX TO THORACIC EIGHT,THORACIC TEN TO LUMBAR ONE, FENESTRATION OF ARACHNOID CYSTS N/A 07/19/2016   Performed by Consuella Lose, MD at [place]      There were no vitals filed for this visit.  Subjective Assessment - 05/19/17 1432    Subjective  My body is ache    Currently in Pain?  Yes    Pain Score  5     Pain Location  Shoulder    Pain Orientation  Right;Left                      [facility] Adult PT Treatment/Exercise - 05/19/17 0001      Transfers   Transfers  Lateral/Scoot Transfers    Sit to Stand  3: Mod assist;1: +2 Total assist      Lumbar Exercises: Aerobic   UBE (Upper Arm Bike)  L 3 4 fwd/4 back      Lumbar Exercises: Standing   Other Standing Lumbar Exercises  seated single arm rows green 2x15      Lumbar Exercises: Seated   Other Seated Lumbar Exercises  seated ball reaches 2x10       Shoulder Exercises: Seated   Other Seated Exercises  2# flex,chest press,abd horz add    Other  Seated Exercises  2# func reaching for UE and core               PT Short Term Goals - 05/08/17 1513      PT SHORT TERM GOAL #2   Title  Pt will perform sit to stand and stand pivot transfers with RW with min A     Status  On-going        PT Long Term Goals - 05/08/17 1513      PT LONG TERM GOAL #1   Title  Pt will ambulate x >250' over level indoor surfaces with RW with supervision    Status  Not Met      PT LONG TERM GOAL #2   Title  Pt will perform sit <> stand and stand pivot transfers CGA with RW       PT LONG TERM GOAL #3   Status  On-going      PT LONG TERM GOAL #4   Title  Pt will  verbalize fall prevention strategies to reduce falls risk.     Status  Achieved      PT LONG TERM GOAL #5   Title  Pt will improve safety with gait as indicated by TUG decreased to 150 seconds with CGA    Status  Not Met            Plan - 05/19/17 1517    Clinical Impression Statement  Again focuses on strength in UE and core. Some difficulty with interventions requiring both UE's due to increase core involvement. Slow progress towards goals.      PT Frequency  2x / week    PT Duration  8 weeks    PT Treatment/Interventions  ADLs/Self Care Home Management;Biofeedback;Canalith Repostioning;Electrical Stimulation;Neuromuscular re-education;Balance training;Therapeutic exercise;Manual techniques;Therapeutic activities;Functional mobility training;Gait training;DME Instruction;Orthotic Fit/Training;Patient/family education;Vestibular;Stair training;Passive range of motion;Energy conservation    PT Next Visit Plan  progress as tolerated, possible 12/5 surgery       Patient will benefit from skilled therapeutic intervention in order to improve the following deficits and impairments:  Abnormal gait, Decreased endurance, Impaired sensation, Decreased strength, Decreased knowledge of use of DME, Decreased balance, Decreased mobility, Decreased range of motion, Decreased coordination,  Impaired flexibility, Postural dysfunction, Difficulty walking  Visit Diagnosis: Difficulty in walking, not elsewhere classified  Muscle weakness (generalized)     Problem List Patient Active Problem List   Diagnosis Date Noted  . Urinary retention   . Acute pain of right knee   . Acute lower UTI   . Weakness of right lower extremity   . Confusion   . Hydrocephalus   . Poor appetite   . Acute blood loss anemia   . Post-operative pain   . Hypokalemia   . Leukocytosis   . Thrombocytopenia ([place])   . Acute deep vein thrombosis (DVT) of femoral vein of left lower extremity ([place])   . Myelopathy ([place]) 07/24/2016  . Thoracic myelopathy   . Depression   . Benign essential HTN   . History of subarachnoid hemorrhage   . Hypercoagulable state ([place])   . Constipation due to pain medication   . 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 Protein-calorie malnutrition ([place]) 12/21/2014  . Thyroid activity decreased 12/21/2014  . 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 Essential hypertension 12/13/2014  . Anemia, iron deficiency 12/13/2014  . Hypothyroidism 12/13/2014  . Prediabetes 12/13/2014   PHYSICAL THERAPY DISCHARGE SUMMARY  Visits from Start of Care: 16 Plan: Patient agrees to discharge.  Patient goals were not met. Patient is being discharged due to a change in medical status.  ?????     Scot Jun, PTA 05/19/2017, 3:19 PM  [place] [address] [place], [place], [address] Phone: [phone]   Fax:  [phone]  Name: Katrina Donaldson MRN: [id number] Date of Birth: [birth year]

## 2017-05-21 ENCOUNTER — Ambulatory Visit: Payer: BC Managed Care – PPO | Admitting: Physical Therapy

## 2017-05-26 ENCOUNTER — Other Ambulatory Visit: Payer: Self-pay

## 2017-05-26 ENCOUNTER — Encounter: Payer: Self-pay | Admitting: Oncology

## 2017-05-26 ENCOUNTER — Ambulatory Visit (INDEPENDENT_AMBULATORY_CARE_PROVIDER_SITE_OTHER): Payer: BC Managed Care – PPO | Admitting: Oncology

## 2017-05-26 VITALS — BP 133/83 | HR 68 | Temp 98.0°F

## 2017-05-26 DIAGNOSIS — D6859 Other primary thrombophilia: Secondary | ICD-10-CM

## 2017-05-26 DIAGNOSIS — Z7189 Other specified counseling: Secondary | ICD-10-CM

## 2017-05-26 DIAGNOSIS — Z7901 Long term (current) use of anticoagulants: Secondary | ICD-10-CM | POA: Diagnosis not present

## 2017-05-26 DIAGNOSIS — I82501 Chronic embolism and thrombosis of unspecified deep veins of right lower extremity: Secondary | ICD-10-CM

## 2017-05-26 DIAGNOSIS — I82412 Acute embolism and thrombosis of left femoral vein: Secondary | ICD-10-CM

## 2017-05-26 DIAGNOSIS — Z993 Dependence on wheelchair: Secondary | ICD-10-CM | POA: Diagnosis not present

## 2017-05-26 DIAGNOSIS — R29818 Other symptoms and signs involving the nervous system: Secondary | ICD-10-CM

## 2017-05-26 HISTORY — DX: Long term (current) use of anticoagulants: Z79.01

## 2017-05-26 LAB — CBC WITH DIFFERENTIAL/PLATELET
BASOS PCT: 0 %
Basophils Absolute: 0 10*3/uL (ref 0.0–0.1)
EOS ABS: 0.1 10*3/uL (ref 0.0–0.7)
Eosinophils Relative: 1 %
HCT: 36.2 % (ref 36.0–46.0)
HEMOGLOBIN: 11.9 g/dL — AB (ref 12.0–15.0)
LYMPHS ABS: 1.3 10*3/uL (ref 0.7–4.0)
Lymphocytes Relative: 18 %
MCH: 29.5 pg (ref 26.0–34.0)
MCHC: 32.9 g/dL (ref 30.0–36.0)
MCV: 89.8 fL (ref 78.0–100.0)
MONO ABS: 0.3 10*3/uL (ref 0.1–1.0)
MONOS PCT: 4 %
Neutro Abs: 5.3 10*3/uL (ref 1.7–7.7)
Neutrophils Relative %: 77 %
Platelets: 512 10*3/uL — ABNORMAL HIGH (ref 150–400)
RBC: 4.03 MIL/uL (ref 3.87–5.11)
RDW: 13.8 % (ref 11.5–15.5)
WBC: 6.9 10*3/uL (ref 4.0–10.5)

## 2017-05-26 LAB — PROTIME-INR
INR: 1.51
Prothrombin Time: 18 seconds — ABNORMAL HIGH (ref 11.4–15.2)

## 2017-05-26 LAB — COMPREHENSIVE METABOLIC PANEL
ALBUMIN: 2.8 g/dL — AB (ref 3.5–5.0)
ALK PHOS: 88 U/L (ref 38–126)
ALT: 17 U/L (ref 14–54)
AST: 15 U/L (ref 15–41)
Anion gap: 5 (ref 5–15)
BILIRUBIN TOTAL: 0.5 mg/dL (ref 0.3–1.2)
BUN: 5 mg/dL — AB (ref 6–20)
CALCIUM: 8.8 mg/dL — AB (ref 8.9–10.3)
CO2: 28 mmol/L (ref 22–32)
CREATININE: 0.81 mg/dL (ref 0.44–1.00)
Chloride: 107 mmol/L (ref 101–111)
GFR calc Af Amer: >60 mL/min (ref >60)
GLUCOSE: 134 mg/dL — AB (ref 65–99)
Potassium: 3.9 mmol/L (ref 3.5–5.1)
Sodium: 140 mmol/L (ref 135–145)
TOTAL PROTEIN: 7.4 g/dL (ref 6.5–8.1)

## 2017-05-26 NOTE — Progress Notes (Signed)
Hematology and Oncology Follow Up Visit  Katrina Donaldson [id number] Apr 04, 1956 61 y.o. 05/26/2017 1:46 PM   Principle Diagnosis: Encounter Diagnoses  Name Primary?  . Acute deep vein thrombosis (DVT) of femoral vein of left lower extremity ([place])   . Hypercoagulable state ([place])   . Chronic anticoagulation Yes   Interim History:   Pleasant 61 year old woman initially evaluated in April 2018 for advice on anticoagulation.  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  I felt this was also a provoked event.  She tested negative for anticardiolipin antibodies and antibodies to beta-2 glycoprotein 1.  She had persistent elevation of d-dimer when last assessed at time of an February 18, 2017 visit here.  Due to persistent immobilization, although both of these events were provoked, I felt that she had ongoing risk factors for recurrent thrombosis and advised staying on Xarelto anticoagulation. Since her most recent visit with me on August 21, her neurologic condition has worsened and surgery is planned for next Tuesday, December 4.   Medications: reviewed  Allergies:  Allergies  Allergen Reactions  . Other Other (See Comments)    Allergic to shellfish such as shrimp     Review of Systems: See interim history Remaining ROS negative:   Physical Exam: Blood pressure 133/83, pulse 68, temperature 98 F (36.7 C), temperature source Oral, SpO2 98 %. Wt Readings from Last 3 Encounters:  12/10/16 220 lb 8 oz (100 kg)  10/22/16 212 lb 4.8 oz (96.3 kg)  07/24/16 189 lb 9.5 oz (86 kg)     General appearance: Well-nourished African-American woman who remains wheelchair-bound HENNT: Proptosis.   Pharynx no erythema, exudate, mass, or ulcer. No thyromegaly or thyroid nodules Lymph nodes: No cervical, supraclavicular, or axillary lymphadenopathy Breasts:  Lungs: Clear to auscultation, resonant to percussion throughout Heart: Regular rhythm, no murmur, no gallop, no rub, no click, no edema Abdomen: Soft, nontender, normal bowel sounds, no mass, no organomegaly.  Limited exam patient in wheelchair Extremities: 1+ ankle edema on the left  no calf tenderness Musculoskeletal: no joint deformities GU:  Vascular: Carotid pulses 2+, no bruits, distal pulses: Neurologic: Alert, oriented, PERRLA, optic discs sharp and vessels normal, no hemorrhage or exudate, cranial nerves grossly normal, motor strength 5 over 5 upper extremities but there is now right lower extremity plegia and just trace movement of the left foot in flexion and extension against gravity but not resistance.  Paralysis of all proximal muscles.  Deterioration compared to my prior exam., reflexes 1+ symmetric, upper body coordination normal, gait normal, Skin: No rash or ecchymosis  Lab Results: CBC W/Diff    Component Value Date/Time   WBC 6.9 05/26/2017 1220   RBC 4.03 05/26/2017 1220   HGB 11.9 (L) 05/26/2017 1220   HGB 11.9 10/22/2016 1459   HCT 36.2 05/26/2017 1220   HCT 36.0 10/22/2016 1459   PLT 512 (H) 05/26/2017 1220   PLT 257 10/22/2016 1459   MCV 89.8 05/26/2017 1220   MCV 88 10/22/2016 1459   MCH 29.5 05/26/2017 1220   MCHC 32.9 05/26/2017 1220   RDW 13.8 05/26/2017 1220   RDW 18.1 (H) 10/22/2016 1459   LYMPHSABS 1.3 05/26/2017 1220   LYMPHSABS 2.0 10/22/2016 1459  MONOABS 0.3 05/26/2017 1220   EOSABS 0.1 05/26/2017 1220   EOSABS 0.1 10/22/2016 1459   BASOSABS 0.0 05/26/2017 1220   BASOSABS 0.0 10/22/2016 1459     Chemistry      Component Value Date/Time   NA 139 08/16/2016 0516   NA 133 (A) 12/14/2014   K 4.4 08/16/2016 0516   CL 106 08/16/2016 0516   CO2 26 08/16/2016 0516   BUN 10 08/16/2016  0516   BUN 8 12/14/2014   CREATININE 0.89 08/16/2016 0516   GLU 94 12/14/2014      Component Value Date/Time   CALCIUM 8.4 (L) 08/16/2016 0516   ALKPHOS 87 07/25/2016 0738   AST 22 07/25/2016 0738   ALT 16 07/25/2016 0738   BILITOT 0.7 07/25/2016 0738       Radiological Studies: No results found.  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  Recommendation for perioperative anticoagulation: Renal function checked today and remains normal with BUN 5, creatinine 0.8. She is instructed to stop anticoagulation for 48 hours prior to surgery stopping on Sunday, December 2.  Resuming 3-4 days postop prior neurosurgeon assessment at that time. Xarelto has a approximate half-life of 8 hours.  The drug should be completely out of her system if it is held for 48 hours.    CC: Patient Care Team: Sun, Vyvyan, MD as PCP - General (Family Medicine)   Marcellis Frampton, MD, FACP  Hematology-Oncology/Internal Medicine     11 /26/20181:46 PM

## 2017-05-26 NOTE — Patient Instructions (Signed)
Stop Xarelto (Rivaroxaban) blood thinner on Sunday, do not take Sunday, Monday, or Tuesday.  Surgeon will advise when to resume after surgery - probably not for 3 or 4 days. To lab here today - will will forward results to admitting Return visit with Hematology Dr Darnell Level in 4 months - can reschedule if weather is bad

## 2017-05-29 ENCOUNTER — Inpatient Hospital Stay (HOSPITAL_COMMUNITY): Admission: RE | Admit: 2017-05-29 | Payer: BC Managed Care – PPO | Source: Ambulatory Visit

## 2017-06-02 ENCOUNTER — Encounter (HOSPITAL_COMMUNITY): Payer: Self-pay | Admitting: *Deleted

## 2017-06-02 ENCOUNTER — Other Ambulatory Visit: Payer: Self-pay

## 2017-06-02 ENCOUNTER — Other Ambulatory Visit: Payer: Self-pay | Admitting: Neurosurgery

## 2017-06-02 NOTE — Progress Notes (Signed)
Pt denies SOB, chest pain, and being under the care of a cardiologist. Pt denies having a stress test and echo. Pt denies having a chest x ray within the last year. Pt stated that her last dose of Xarelto was " Thursday or Friday." Pt made aware to stop taking Aspirin, vitamins, fish oil and herbal medications. Do not take any NSAIDs ie: Ibuprofen, Advil, Naproxen (Aleve), Motrin, BC and Goody Powder. Pt verbalized understanding of all pre-op instructions.

## 2017-06-03 ENCOUNTER — Encounter (HOSPITAL_COMMUNITY): Payer: Self-pay

## 2017-06-03 ENCOUNTER — Ambulatory Visit (HOSPITAL_COMMUNITY)
Admission: RE | Admit: 2017-06-03 | Discharge: 2017-06-03 | Disposition: A | Discharge status: Home / Self Care | Payer: BC Managed Care – PPO | Source: Ambulatory Visit | Attending: Neurosurgery | Admitting: Neurosurgery

## 2017-06-03 ENCOUNTER — Inpatient Hospital Stay (HOSPITAL_COMMUNITY): Payer: BC Managed Care – PPO | Admitting: Vascular Surgery

## 2017-06-03 ENCOUNTER — Encounter (HOSPITAL_COMMUNITY)
Admission: RE | Disposition: A | Discharge status: Skilled Nursing Facility | Payer: Self-pay | Source: Ambulatory Visit | Attending: Neurosurgery

## 2017-06-03 ENCOUNTER — Inpatient Hospital Stay (HOSPITAL_COMMUNITY)
Admission: RE | Admit: 2017-06-03 | Discharge: 2017-06-05 | DRG: 029 | Disposition: A | Discharge status: Skilled Nursing Facility | Payer: BC Managed Care – PPO | Source: Ambulatory Visit | Attending: Neurosurgery | Admitting: Neurosurgery

## 2017-06-03 DIAGNOSIS — G9619 Other disorders of meninges, not elsewhere classified: Secondary | ICD-10-CM | POA: Diagnosis present

## 2017-06-03 DIAGNOSIS — G8222 Paraplegia, incomplete: Secondary | ICD-10-CM | POA: Diagnosis present

## 2017-06-03 DIAGNOSIS — Z7901 Long term (current) use of anticoagulants: Secondary | ICD-10-CM | POA: Diagnosis not present

## 2017-06-03 DIAGNOSIS — I10 Essential (primary) hypertension: Secondary | ICD-10-CM | POA: Diagnosis present

## 2017-06-03 DIAGNOSIS — Z86718 Personal history of other venous thrombosis and embolism: Secondary | ICD-10-CM

## 2017-06-03 DIAGNOSIS — E669 Obesity, unspecified: Secondary | ICD-10-CM | POA: Diagnosis present

## 2017-06-03 DIAGNOSIS — Z8679 Personal history of other diseases of the circulatory system: Secondary | ICD-10-CM | POA: Diagnosis not present

## 2017-06-03 DIAGNOSIS — G96198 Other disorders of meninges, not elsewhere classified: Secondary | ICD-10-CM

## 2017-06-03 DIAGNOSIS — Z79890 Hormone replacement therapy (postmenopausal): Secondary | ICD-10-CM | POA: Diagnosis not present

## 2017-06-03 DIAGNOSIS — I739 Peripheral vascular disease, unspecified: Secondary | ICD-10-CM | POA: Diagnosis present

## 2017-06-03 DIAGNOSIS — R200 Anesthesia of skin: Secondary | ICD-10-CM | POA: Diagnosis present

## 2017-06-03 DIAGNOSIS — E039 Hypothyroidism, unspecified: Secondary | ICD-10-CM | POA: Diagnosis present

## 2017-06-03 DIAGNOSIS — G9529 Other cord compression: Secondary | ICD-10-CM | POA: Diagnosis present

## 2017-06-03 DIAGNOSIS — Z6833 Body mass index (BMI) 33.0-33.9, adult: Secondary | ICD-10-CM

## 2017-06-03 DIAGNOSIS — Z993 Dependence on wheelchair: Secondary | ICD-10-CM

## 2017-06-03 DIAGNOSIS — Z8673 Personal history of transient ischemic attack (TIA), and cerebral infarction without residual deficits: Secondary | ICD-10-CM | POA: Diagnosis not present

## 2017-06-03 HISTORY — DX: Cerebral cysts: G93.0

## 2017-06-03 HISTORY — DX: Dependence on wheelchair: Z99.3

## 2017-06-03 HISTORY — PX: LAMINECTOMY: SHX219

## 2017-06-03 LAB — BASIC METABOLIC PANEL
ANION GAP: 11 (ref 5–15)
BUN: 10 mg/dL (ref 6–20)
CALCIUM: 9.3 mg/dL (ref 8.9–10.3)
CO2: 25 mmol/L (ref 22–32)
CREATININE: 0.99 mg/dL (ref 0.44–1.00)
Chloride: 104 mmol/L (ref 101–111)
GFR calc Af Amer: >60 mL/min (ref >60)
GFR, EST NON AFRICAN AMERICAN: 59 mL/min — AB (ref >60)
GLUCOSE: 99 mg/dL (ref 65–99)
Potassium: 3.9 mmol/L (ref 3.5–5.1)
Sodium: 140 mmol/L (ref 135–145)

## 2017-06-03 LAB — SURGICAL PCR SCREEN
MRSA, PCR: NEGATIVE
Staphylococcus aureus: NEGATIVE

## 2017-06-03 LAB — ABO/RH: ABO/RH(D): AB POS

## 2017-06-03 LAB — CBC
HCT: 42.7 % (ref 36.0–46.0)
Hemoglobin: 14.2 g/dL (ref 12.0–15.0)
MCH: 29.5 pg (ref 26.0–34.0)
MCHC: 33.3 g/dL (ref 30.0–36.0)
MCV: 88.8 fL (ref 78.0–100.0)
PLATELETS: 435 10*3/uL — AB (ref 150–400)
RBC: 4.81 MIL/uL (ref 3.87–5.11)
RDW: 14.3 % (ref 11.5–15.5)
WBC: 7.7 10*3/uL (ref 4.0–10.5)

## 2017-06-03 LAB — PROTIME-INR
INR: 1.02
Prothrombin Time: 13.3 seconds (ref 11.4–15.2)

## 2017-06-03 LAB — TYPE AND SCREEN
ABO/RH(D): AB POS
ANTIBODY SCREEN: NEGATIVE

## 2017-06-03 SURGERY — THORACIC LAMINECTOMY FOR TUMOR
Anesthesia: General | Site: Back

## 2017-06-03 MED ORDER — PROPOFOL 10 MG/ML IV BOLUS
INTRAVENOUS | Status: DC | PRN
  Administered 2017-06-03: 160 mg via INTRAVENOUS

## 2017-06-03 MED ORDER — PAROXETINE HCL 20 MG PO TABS
20.0000 mg | ORAL_TABLET | Freq: Every day | ORAL | Status: DC
  Administered 2017-06-04 – 2017-06-05 (×2): 20 mg via ORAL
  Filled 2017-06-03 (×2): qty 1

## 2017-06-03 MED ORDER — ACETAMINOPHEN 650 MG RE SUPP: 650.0000 mg | RECTAL | Status: DC | PRN

## 2017-06-03 MED ORDER — LIDOCAINE HCL (CARDIAC) 20 MG/ML IV SOLN
INTRAVENOUS | Status: DC | PRN
  Administered 2017-06-03: 30 mg via INTRAVENOUS

## 2017-06-03 MED ORDER — SODIUM CHLORIDE 0.9% FLUSH: 3.0000 mL | INTRAVENOUS | Status: DC | PRN

## 2017-06-03 MED ORDER — OXYCODONE HCL 5 MG PO TABS
10.0000 mg | ORAL_TABLET | ORAL | Status: DC | PRN
  Administered 2017-06-03 – 2017-06-05 (×5): 10 mg via ORAL
  Filled 2017-06-03 (×5): qty 2

## 2017-06-03 MED ORDER — LIDOCAINE-EPINEPHRINE 1 %-1:100000 IJ SOLN
INTRAMUSCULAR | Status: AC
  Filled 2017-06-03: qty 1

## 2017-06-03 MED ORDER — CHLORHEXIDINE GLUCONATE CLOTH 2 % EX PADS: 6.0000 | MEDICATED_PAD | Freq: Once | CUTANEOUS | Status: DC

## 2017-06-03 MED ORDER — SUGAMMADEX SODIUM 200 MG/2ML IV SOLN
INTRAVENOUS | Status: DC | PRN
  Administered 2017-06-03: 181.4 mg via INTRAVENOUS

## 2017-06-03 MED ORDER — ONDANSETRON HCL 4 MG PO TABS
4.0000 mg | ORAL_TABLET | Freq: Four times a day (QID) | ORAL | Status: DC | PRN
  Administered 2017-06-03: 4 mg via ORAL
  Filled 2017-06-03: qty 1

## 2017-06-03 MED ORDER — PHENYLEPHRINE HCL 10 MG/ML IJ SOLN
INTRAVENOUS | Status: DC | PRN
  Administered 2017-06-03: 50 ug/min via INTRAVENOUS

## 2017-06-03 MED ORDER — FENTANYL CITRATE (PF) 100 MCG/2ML IJ SOLN
25.0000 ug | INTRAMUSCULAR | Status: DC | PRN
  Administered 2017-06-03 (×2): 50 ug via INTRAVENOUS

## 2017-06-03 MED ORDER — ROCURONIUM BROMIDE 100 MG/10ML IV SOLN
INTRAVENOUS | Status: DC | PRN
  Administered 2017-06-03: 10 mg via INTRAVENOUS
  Administered 2017-06-03: 50 mg via INTRAVENOUS

## 2017-06-03 MED ORDER — FENTANYL CITRATE (PF) 100 MCG/2ML IJ SOLN
INTRAMUSCULAR | Status: AC
  Administered 2017-06-03: 50 ug via INTRAVENOUS
  Filled 2017-06-03: qty 2

## 2017-06-03 MED ORDER — THROMBIN (RECOMBINANT) 5000 UNITS EX SOLR
CUTANEOUS | Status: AC
  Filled 2017-06-03: qty 10000

## 2017-06-03 MED ORDER — BACLOFEN 10 MG PO TABS: 10.0000 mg | ORAL_TABLET | Freq: Three times a day (TID) | ORAL | Status: DC | PRN

## 2017-06-03 MED ORDER — ONDANSETRON HCL 4 MG/2ML IJ SOLN: 4.0000 mg | Freq: Four times a day (QID) | INTRAMUSCULAR | Status: DC | PRN

## 2017-06-03 MED ORDER — PHENYLEPHRINE HCL 10 MG/ML IJ SOLN
INTRAMUSCULAR | Status: DC | PRN
  Administered 2017-06-03: 80 ug via INTRAVENOUS
  Administered 2017-06-03: 40 ug via INTRAVENOUS
  Administered 2017-06-03: 80 ug via INTRAVENOUS

## 2017-06-03 MED ORDER — MORPHINE SULFATE (PF) 2 MG/ML IV SOLN: 2.0000 mg | INTRAVENOUS | Status: DC | PRN

## 2017-06-03 MED ORDER — PHENOL 1.4 % MT LIQD: 1.0000 | OROMUCOSAL | Status: DC | PRN

## 2017-06-03 MED ORDER — MENTHOL 3 MG MT LOZG: 1.0000 | LOZENGE | OROMUCOSAL | Status: DC | PRN

## 2017-06-03 MED ORDER — SENNA 8.6 MG PO TABS
1.0000 | ORAL_TABLET | Freq: Two times a day (BID) | ORAL | Status: DC
  Administered 2017-06-04 – 2017-06-05 (×3): 8.6 mg via ORAL
  Filled 2017-06-03 (×4): qty 1

## 2017-06-03 MED ORDER — ADULT MULTIVITAMIN W/MINERALS CH
1.0000 | ORAL_TABLET | Freq: Every day | ORAL | Status: DC
  Administered 2017-06-04 – 2017-06-05 (×2): 1 via ORAL
  Filled 2017-06-03 (×2): qty 1

## 2017-06-03 MED ORDER — BISACODYL 10 MG RE SUPP: 10.0000 mg | Freq: Every day | RECTAL | Status: DC | PRN

## 2017-06-03 MED ORDER — FLUTICASONE PROPIONATE 50 MCG/ACT NA SUSP
1.0000 | Freq: Every day | NASAL | Status: DC | PRN
  Filled 2017-06-03: qty 16

## 2017-06-03 MED ORDER — LEVOTHYROXINE SODIUM 100 MCG PO TABS
100.0000 ug | ORAL_TABLET | Freq: Every day | ORAL | Status: DC
  Administered 2017-06-04 – 2017-06-05 (×2): 100 ug via ORAL
  Filled 2017-06-03 (×2): qty 1

## 2017-06-03 MED ORDER — BACITRACIN ZINC 500 UNIT/GM EX OINT
TOPICAL_OINTMENT | CUTANEOUS | Status: DC | PRN
  Administered 2017-06-03: 1 via TOPICAL

## 2017-06-03 MED ORDER — SODIUM CHLORIDE 0.9 % IV SOLN: 250.0000 mL | INTRAVENOUS | Status: DC

## 2017-06-03 MED ORDER — FENTANYL CITRATE (PF) 100 MCG/2ML IJ SOLN
INTRAMUSCULAR | Status: AC
  Filled 2017-06-03: qty 4

## 2017-06-03 MED ORDER — DOCUSATE SODIUM 100 MG PO CAPS
100.0000 mg | ORAL_CAPSULE | Freq: Two times a day (BID) | ORAL | Status: DC
  Administered 2017-06-03 – 2017-06-05 (×4): 100 mg via ORAL
  Filled 2017-06-03 (×4): qty 1

## 2017-06-03 MED ORDER — AMLODIPINE BESYLATE 10 MG PO TABS
10.0000 mg | ORAL_TABLET | Freq: Every day | ORAL | Status: DC
  Administered 2017-06-05: 10 mg via ORAL
  Filled 2017-06-03 (×2): qty 1

## 2017-06-03 MED ORDER — FENTANYL CITRATE (PF) 100 MCG/2ML IJ SOLN
INTRAMUSCULAR | Status: DC | PRN
  Administered 2017-06-03: 100 ug via INTRAVENOUS

## 2017-06-03 MED ORDER — OXYCODONE HCL 5 MG PO TABS: 5.0000 mg | ORAL_TABLET | ORAL | Status: DC | PRN

## 2017-06-03 MED ORDER — KETOROLAC TROMETHAMINE 15 MG/ML IJ SOLN
15.0000 mg | Freq: Four times a day (QID) | INTRAMUSCULAR | Status: AC
  Administered 2017-06-04 (×4): 15 mg via INTRAVENOUS
  Filled 2017-06-03 (×3): qty 1

## 2017-06-03 MED ORDER — ONDANSETRON HCL 4 MG/2ML IJ SOLN
INTRAMUSCULAR | Status: DC | PRN
  Administered 2017-06-03: 4 mg via INTRAVENOUS

## 2017-06-03 MED ORDER — THROMBIN (RECOMBINANT) 5000 UNITS EX SOLR
CUTANEOUS | Status: DC | PRN
  Administered 2017-06-03: 5000 [IU] via TOPICAL

## 2017-06-03 MED ORDER — MIDAZOLAM HCL 2 MG/2ML IJ SOLN
INTRAMUSCULAR | Status: AC
  Filled 2017-06-03: qty 2

## 2017-06-03 MED ORDER — ACETAMINOPHEN 325 MG PO TABS: 650.0000 mg | ORAL_TABLET | ORAL | Status: DC | PRN

## 2017-06-03 MED ORDER — SODIUM CHLORIDE 0.9% FLUSH
3.0000 mL | Freq: Two times a day (BID) | INTRAVENOUS | Status: DC
  Administered 2017-06-05: 3 mL via INTRAVENOUS

## 2017-06-03 MED ORDER — SODIUM CHLORIDE 0.9 % IV SOLN
INTRAVENOUS | Status: DC
  Administered 2017-06-03: 21:00:00 via INTRAVENOUS

## 2017-06-03 MED ORDER — METOPROLOL TARTRATE 50 MG PO TABS
100.0000 mg | ORAL_TABLET | Freq: Two times a day (BID) | ORAL | Status: DC
  Administered 2017-06-03 – 2017-06-05 (×3): 100 mg via ORAL
  Filled 2017-06-03 (×4): qty 2

## 2017-06-03 MED ORDER — BACITRACIN 50000 UNITS IM SOLR
INTRAMUSCULAR | Status: DC | PRN
  Administered 2017-06-03: 16:00:00

## 2017-06-03 MED ORDER — LACTATED RINGERS IV SOLN
INTRAVENOUS | Status: DC | PRN
  Administered 2017-06-03 (×2): via INTRAVENOUS

## 2017-06-03 MED ORDER — ALBUMIN HUMAN 5 % IV SOLN
INTRAVENOUS | Status: DC | PRN
  Administered 2017-06-03: 14:00:00 via INTRAVENOUS

## 2017-06-03 MED ORDER — ONDANSETRON HCL 4 MG/2ML IJ SOLN: 4.0000 mg | Freq: Once | INTRAMUSCULAR | Status: DC | PRN

## 2017-06-03 MED ORDER — FENTANYL CITRATE (PF) 250 MCG/5ML IJ SOLN
INTRAMUSCULAR | Status: AC
  Filled 2017-06-03: qty 5

## 2017-06-03 MED ORDER — BACITRACIN ZINC 500 UNIT/GM EX OINT
TOPICAL_OINTMENT | CUTANEOUS | Status: AC
  Filled 2017-06-03: qty 28.35

## 2017-06-03 MED ORDER — PROPOFOL 10 MG/ML IV BOLUS
INTRAVENOUS | Status: AC
  Filled 2017-06-03: qty 20

## 2017-06-03 MED ORDER — EPHEDRINE SULFATE 50 MG/ML IJ SOLN
INTRAMUSCULAR | Status: DC | PRN
  Administered 2017-06-03: 20 mg via INTRAVENOUS

## 2017-06-03 MED ORDER — PANTOPRAZOLE SODIUM 40 MG IV SOLR
40.0000 mg | Freq: Every day | INTRAVENOUS | Status: DC
  Administered 2017-06-03: 40 mg via INTRAVENOUS
  Filled 2017-06-03: qty 40

## 2017-06-03 MED ORDER — PROPOFOL 10 MG/ML IV BOLUS
INTRAVENOUS | Status: AC
  Filled 2017-06-03: qty 40

## 2017-06-03 MED ORDER — 0.9 % SODIUM CHLORIDE (POUR BTL) OPTIME
TOPICAL | Status: DC | PRN
  Administered 2017-06-03: 1000 mL

## 2017-06-03 MED ORDER — HEMOSTATIC AGENTS (NO CHARGE) OPTIME
TOPICAL | Status: DC | PRN
  Administered 2017-06-03 (×2): 1 via TOPICAL

## 2017-06-03 MED ORDER — DEXAMETHASONE SODIUM PHOSPHATE 10 MG/ML IJ SOLN
INTRAMUSCULAR | Status: DC | PRN
  Administered 2017-06-03: 10 mg via INTRAVENOUS

## 2017-06-03 MED ORDER — CEFAZOLIN SODIUM-DEXTROSE 2-4 GM/100ML-% IV SOLN
2.0000 g | Freq: Three times a day (TID) | INTRAVENOUS | Status: AC
  Administered 2017-06-03 – 2017-06-04 (×2): 2 g via INTRAVENOUS
  Filled 2017-06-03 (×2): qty 100

## 2017-06-03 MED ORDER — BUPIVACAINE HCL (PF) 0.25 % IJ SOLN
INTRAMUSCULAR | Status: AC
  Filled 2017-06-03: qty 30

## 2017-06-03 MED ORDER — CEFAZOLIN SODIUM-DEXTROSE 2-4 GM/100ML-% IV SOLN
2.0000 g | INTRAVENOUS | Status: AC
  Administered 2017-06-03: 2 g via INTRAVENOUS
  Filled 2017-06-03: qty 100

## 2017-06-03 SURGICAL SUPPLY — 74 items
BAG DECANTER FOR FLEXI CONT (MISCELLANEOUS) ×3 IMPLANT
BENZOIN TINCTURE PRP APPL 2/3 (GAUZE/BANDAGES/DRESSINGS) IMPLANT
BLADE SURG 11 STRL SS (BLADE) ×3 IMPLANT
BLADE ULTRA TIP 2M (BLADE) IMPLANT
BUR ACRON 5.0MM COATED (BURR) ×3 IMPLANT
BUR MATCHSTICK NEURO 3.0 LAGG (BURR) ×3 IMPLANT
BUR PRECISION FLUTE 5.0 (BURR) ×3 IMPLANT
CANISTER SUCT 3000ML PPV (MISCELLANEOUS) ×3 IMPLANT
CARTRIDGE OIL MAESTRO DRILL (MISCELLANEOUS) ×1 IMPLANT
CLIP VESOCCLUDE MED 6/CT (CLIP) IMPLANT
CLOSURE WOUND 1/4X4 (GAUZE/BANDAGES/DRESSINGS)
COVER MAYO STAND STRL (DRAPES) IMPLANT
DIFFUSER DRILL AIR PNEUMATIC (MISCELLANEOUS) ×3 IMPLANT
DRAPE LAPAROTOMY 100X72 PEDS (DRAPES) IMPLANT
DRAPE LAPAROTOMY 100X72X124 (DRAPES) ×3 IMPLANT
DRAPE MICROSCOPE LEICA (MISCELLANEOUS) ×3 IMPLANT
DRAPE POUCH INSTRU U-SHP 10X18 (DRAPES) IMPLANT
DRSG OPSITE POSTOP 4X8 (GAUZE/BANDAGES/DRESSINGS) ×3 IMPLANT
ELECT REM PT RETURN 9FT ADLT (ELECTROSURGICAL) ×3
ELECTRODE REM PT RTRN 9FT ADLT (ELECTROSURGICAL) ×1 IMPLANT
GAUZE SPONGE 4X4 12PLY STRL (GAUZE/BANDAGES/DRESSINGS) IMPLANT
GAUZE SPONGE 4X4 16PLY XRAY LF (GAUZE/BANDAGES/DRESSINGS) IMPLANT
GLOVE BIO SURGEON STRL SZ 6.5 (GLOVE) ×4 IMPLANT
GLOVE BIO SURGEON STRL SZ7 (GLOVE) IMPLANT
GLOVE BIO SURGEONS STRL SZ 6.5 (GLOVE) ×2
GLOVE BIOGEL PI IND STRL 6.5 (GLOVE) ×1 IMPLANT
GLOVE BIOGEL PI IND STRL 7.0 (GLOVE) IMPLANT
GLOVE BIOGEL PI IND STRL 7.5 (GLOVE) ×1 IMPLANT
GLOVE BIOGEL PI INDICATOR 6.5 (GLOVE) ×2
GLOVE BIOGEL PI INDICATOR 7.0 (GLOVE)
GLOVE BIOGEL PI INDICATOR 7.5 (GLOVE) ×2
GLOVE ECLIPSE 7.0 STRL STRAW (GLOVE) ×3 IMPLANT
GLOVE ECLIPSE 9.0 STRL (GLOVE) ×3 IMPLANT
GLOVE EXAM NITRILE LRG STRL (GLOVE) IMPLANT
GLOVE EXAM NITRILE XL STR (GLOVE) IMPLANT
GLOVE EXAM NITRILE XS STR PU (GLOVE) IMPLANT
GLOVE SURG SS PI 7.0 STRL IVOR (GLOVE) ×9 IMPLANT
GLOVE SURG SS PI 7.5 STRL IVOR (GLOVE) ×6 IMPLANT
GOWN STRL REUS W/ TWL LRG LVL3 (GOWN DISPOSABLE) IMPLANT
GOWN STRL REUS W/ TWL XL LVL3 (GOWN DISPOSABLE) ×4 IMPLANT
GOWN STRL REUS W/TWL 2XL LVL3 (GOWN DISPOSABLE) IMPLANT
GOWN STRL REUS W/TWL LRG LVL3 (GOWN DISPOSABLE)
GOWN STRL REUS W/TWL XL LVL3 (GOWN DISPOSABLE) ×8
HEMOSTAT POWDER KIT SURGIFOAM (HEMOSTASIS) ×3 IMPLANT
HEMOSTAT SURGICEL 2X14 (HEMOSTASIS) IMPLANT
KIT BASIN OR (CUSTOM PROCEDURE TRAY) ×3 IMPLANT
KIT ROOM TURNOVER OR (KITS) ×3 IMPLANT
NEEDLE HYPO 22GX1.5 SAFETY (NEEDLE) ×3 IMPLANT
NEEDLE SPNL 18GX3.5 QUINCKE PK (NEEDLE) ×3 IMPLANT
NS IRRIG 1000ML POUR BTL (IV SOLUTION) ×3 IMPLANT
OIL CARTRIDGE MAESTRO DRILL (MISCELLANEOUS) ×3
PACK LAMINECTOMY NEURO (CUSTOM PROCEDURE TRAY) ×3 IMPLANT
PAD ARMBOARD 7.5X6 YLW CONV (MISCELLANEOUS) ×9 IMPLANT
PATTIES SURGICAL .25X.25 (GAUZE/BANDAGES/DRESSINGS) IMPLANT
PATTIES SURGICAL .5 X3 (DISPOSABLE) IMPLANT
PATTIES SURGICAL 1/4 X 3 (GAUZE/BANDAGES/DRESSINGS) IMPLANT
RUBBERBAND STERILE (MISCELLANEOUS) ×9 IMPLANT
SEALANT ADHERUS EXTEND TIP (MISCELLANEOUS) ×3 IMPLANT
SPECIMEN JAR SMALL (MISCELLANEOUS) IMPLANT
SPONGE LAP 4X18 X RAY DECT (DISPOSABLE) IMPLANT
SPONGE NEURO XRAY DETECT 1X3 (DISPOSABLE) IMPLANT
SPONGE SURGIFOAM ABS GEL 100 (HEMOSTASIS) IMPLANT
STAPLER VISISTAT 35W (STAPLE) ×3 IMPLANT
STRIP CLOSURE SKIN 1/4X4 (GAUZE/BANDAGES/DRESSINGS) IMPLANT
SUT PROLENE 6 0 BV (SUTURE) IMPLANT
SUT VIC AB 0 CT1 18XCR BRD8 (SUTURE) ×3 IMPLANT
SUT VIC AB 0 CT1 8-18 (SUTURE) ×6
SUT VIC AB 2-0 CP2 18 (SUTURE) ×3 IMPLANT
SYR BULB 3OZ (MISCELLANEOUS) ×3 IMPLANT
TOWEL GREEN STERILE (TOWEL DISPOSABLE) ×3 IMPLANT
TOWEL GREEN STERILE FF (TOWEL DISPOSABLE) ×3 IMPLANT
TRAY FOLEY IC TEMP SENS 16FR (CATHETERS) ×3 IMPLANT
TRAY FOLEY W/METER SILVER 16FR (SET/KITS/TRAYS/PACK) ×3 IMPLANT
WATER STERILE IRR 1000ML POUR (IV SOLUTION) ×3 IMPLANT

## 2017-06-03 NOTE — H&P (Signed)
Chief Complaint  Leg weakness, numbness  History of Present Illness  [name] is a 61 year old woman I am seeing in follow-up. Briefly, she has a previous history of subarachnoid hemorrhage at an outside hospital. She began to develop lower extremity weakness and imbalance. MRI demonstrated a fairly extensive loculated arachnoid cyst encompassing the thoracic spine for which she went multilevel laminectomy and fenestration. Although she had some improvement initially after surgery, at our last visit she was complaining of continued inability to walk without the assistance of a rolling walker. Follow-up MRI scan did demonstrate persistence of a portion of the arachnoid cyst at about the T3-T5 level. She comes in with worsening in her condition. She says over the last few weeks she has been unable to walk even with a rolling walker with assistance. She says that she also has lost bladder control and has been wearing Depends. This has occurred over the last 2 months or so. She now says that she has a hard time moving her legs at all. She also has significantly decreased sensation from about the mid chest down.   Past Medical History   Past Medical History:  Diagnosis Date  . Arachnoid cyst   . Brain aneurysm   . Chronic anticoagulation 05/26/2017  . Depression   . History of DVT (deep vein thrombosis)   . Hypercoagulable state ([place])   . Hypertension   . Hypothyroidism   . Stroke [facility]) 2016   [facility] 11/29/14  . Wheelchair bound     Past Surgical History   Past Surgical History:  Procedure Laterality Date  . ABDOMINAL HYSTERECTOMY     patient denies  . BRAIN SURGERY     aneurysm  . goiter    . LAMINECTOMY N/A 07/19/2016   Procedure: LAMINECTOMY THORACIC FOUR THORACIC FIVE, THORACIC SIX TO THORACIC EIGHT,THORACIC TEN TO LUMBAR ONE, FENESTRATION OF ARACHNOID CYSTS;  Surgeon: Consuella Lose, MD;  Location: [place];  Service: Neurosurgery;  Laterality: N/A;  . THYROIDECTOMY    . TUBAL  LIGATION      Social History   Social History   Tobacco Use  . Smoking status: Never Smoker  . Smokeless tobacco: Never Used  Substance Use Topics  . Alcohol use: No    Alcohol/week: 0.0 oz  . Drug use: No    Medications   Prior to Admission medications   Medication Sig Start Date End Date Taking? Authorizing Provider  acetaminophen (TYLENOL) 500 MG tablet Take 500-1,000 mg by mouth every 6 (six) hours as needed for moderate pain or headache.   Yes [provider]  amLODipine (NORVASC) 10 MG tablet Take 1 tablet (10 mg total) by mouth daily. 08/23/16  Yes Angiulli, Lavon Paganini, PA-C  baclofen (LIORESAL) 10 MG tablet Take 10 mg by mouth 3 (three) times daily as needed for muscle spasms.  05/09/17  Yes [provider]  fluticasone (FLONASE) 50 MCG/ACT nasal spray Place 1 spray into both nostrils daily as needed for allergies.    Yes [provider]  levothyroxine (SYNTHROID, LEVOTHROID) 100 MCG tablet Take 100 mcg by mouth daily before breakfast.   Yes [provider]  metoprolol (LOPRESSOR) 100 MG tablet Take 1 tablet (100 mg total) by mouth 2 (two) times daily. 08/23/16  Yes Angiulli, Lavon Paganini, PA-C  Multiple Vitamin (MULTIVITAMIN WITH MINERALS) TABS tablet Take 1 tablet by mouth daily. 08/24/16  Yes Angiulli, Lavon Paganini, PA-C  PARoxetine (PAXIL) 20 MG tablet Take 1 tablet (20 mg total) by mouth daily. 08/23/16  Yes Angiulli, Lavon Paganini, PA-C  rivaroxaban (XARELTO) 20 MG TABS tablet Take 1 tablet (20 mg total) by mouth daily with supper. 08/23/16  Yes Angiulli, Lavon Paganini, PA-C  ferrous sulfate 325 (65 FE) MG tablet Take 1 tablet (325 mg total) by mouth daily with breakfast. Patient not taking: Reported on 05/29/2017 08/24/16   Angiulli, Lavon Paganini, PA-C  lidocaine (LIDODERM) 5 % Place 1 patch onto the skin daily. Remove & Discard patch within 12 hours or as directed by MD Patient not taking: Reported on 05/29/2017 08/24/16   Angiulli, Lavon Paganini, PA-C  methocarbamol  (ROBAXIN) 500 MG tablet Take 1 tablet (500 mg total) by mouth every 6 (six) hours as needed for muscle spasms. Patient not taking: Reported on 05/29/2017 08/23/16   Angiulli, Lavon Paganini, PA-C  omeprazole (PRILOSEC) 20 MG capsule Take 1 capsule (20 mg total) by mouth daily. Patient not taking: Reported on 05/29/2017 08/23/16   Angiulli, Lavon Paganini, PA-C  oxyCODONE-acetaminophen (PERCOCET/ROXICET) 5-325 MG tablet Take 1-2 tablets by mouth every 4 (four) hours as needed for moderate pain. Patient not taking: Reported on 05/29/2017 08/23/16   Angiulli, Lavon Paganini, PA-C  vitamin C (VITAMIN C) 250 MG tablet Take 1 tablet (250 mg total) by mouth daily with breakfast. Patient not taking: Reported on 05/29/2017 08/24/16   Cathlyn Parsons, PA-C    Allergies   Allergies  Allergen Reactions  . Other Other (See Comments)    Allergic to shellfish such as shrimp     Review of Systems  ROS  Neurologic Exam  Awake, alert, oriented Memory and concentration grossly intact Speech fluent, appropriate CN grossly intact Motor exam: Upper Extremities Deltoid Bicep Tricep Grip  Right 5/5 5/5 5/5 5/5  Left 5/5 5/5 5/5 5/5   Lower Extremities IP Quad PF DF EHL  Right 1/5 -->      Left 1/5 -->       Sensory level ~T6  Imaging  MRI of the thoracic spine dated 02/19/2017 was again reviewed. This demonstrates large ventral and slightly right eccentric arachnoid cyst at about the T3-T5 level with significant compression of the thoracic spinal cord at this area. There is good decompression of the thoracic spinal cord inferior to this level, however there is intrinsic T2 signal change within the cord itself.  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  Plan  proceed with repeat laminectomy and fenestration of the upper thoracic arachnoid cyst from T3-T5.   I have previously reviewed the MRI findings  with the patient.  We did also discuss risks of surgery which are the same as the prior surgery. The patient appeared to understand our discussion. All her questions were answered. She indicated willingness to proceed with surgery as above.

## 2017-06-03 NOTE — Transfer of Care (Signed)
Immediate Anesthesia Transfer of Care Note  Patient: Katrina Donaldson  Procedure(s) Performed: THORACIC THREE - THORACIC FIVE LAMINECTOMY, FENESTRATION OF ARACHNOID CYST (N/A Back)  Patient Location: PACU  Anesthesia Type:General  Level of Consciousness: awake, alert  and oriented  Airway & Oxygen Therapy: Patient Spontanous Breathing and Patient connected to nasal cannula oxygen  Post-op Assessment: Report given to RN and Post -op Vital signs reviewed and stable  Post vital signs: Reviewed and stable  Last Vitals:  Vitals:   06/03/17 1212 06/03/17 1655  BP: (!) 150/90 (P) 117/75  Pulse: 60 (P) 80  Resp: 18 (P) 20  Temp: 36.6 C (P) 36.4 C  SpO2: 100% (P) 100%    Last Pain:  Vitals:   06/03/17 1212  TempSrc: Oral      Patients Stated Pain Goal: 0 (72/82/06 0156)  Complications: No apparent anesthesia complications

## 2017-06-03 NOTE — Anesthesia Procedure Notes (Signed)
Procedure Name: Intubation Date/Time: 06/03/2017 1:33 PM Performed by: Eligha Bridegroom, CRNA Pre-anesthesia Checklist: Patient identified, Emergency Drugs available, Suction available, Patient being monitored and Timeout performed Patient Re-evaluated:Patient Re-evaluated prior to induction Oxygen Delivery Method: Circle system utilized Preoxygenation: Pre-oxygenation with 100% oxygen Induction Type: IV induction Ventilation: Mask ventilation without difficulty and Oral airway inserted - appropriate to patient size Laryngoscope Size: Mac and 4 Grade View: Grade I Tube type: Oral Tube size: 7.0 mm Number of attempts: 1 Airway Equipment and Method: Stylet Secured at: 21 cm Tube secured with: Tape Dental Injury: Teeth and Oropharynx as per pre-operative assessment

## 2017-06-03 NOTE — Op Note (Signed)
PREOP DIAGNOSIS:  1.  Thoracic spinal arachnoid cyst 2.  Thoracic myelopathy  POSTOP DIAGNOSIS:  Same  PROCEDURE:  1. T3-T5 laminectomy, Right T4 transpedicular approach 2. Fenestration of ventral arachnoid cyst 3. De-tethering of spinal cord   SURGEON: Dr. Consuella Lose, MD  ASSISTANT: Dr. Janece Canterbury, MD  ANESTHESIA: General Endotracheal  EBL: 70cc  SPECIMENS: None  DRAINS: None  COMPLICATIONS: None immediate  CONDITION: Hemodynamically stable to postanesthesia care unit  HISTORY: Katrina Donaldson is a 61 y.o. female with a history of subarachnoid hemorrhage who initially presented with thoracic myelopathy more than 1 year ago.  Imaging demonstrated very large with multiloculated arachnoid cyst extending from the inferior cervical cord down to the upper lumbar region.  She initially underwent multilevel thoracic laminectomy for fenestration of the cyst nearly 1 year ago.  She remained clinically stable for a period of time, but presented again approximately a month ago with acute worsening, with near complete motor deficit in the lower extremities, sensory level at approximately T6, and loss of bladder function.  Repeat imaging demonstrated persistence of arachnoid cyst at T3-T5, with significant cord edema below that.  There did not appear to be any significant cyst recurrence below the T5 level.  Repeat laminectomy and fenestration was therefore indicated.  The risks and benefits of the surgery were explained in detail to the patient.  After all questions were answered, informed consent was obtained.  PROCEDURE IN DETAIL: After informed consent was obtained and witnessed, the patient was brought to the operating room. After induction of general anesthesia, the patient was positioned on the operative table in the prone position. All pressure points were meticulously padded. Skin incision was then marked out and prepped and draped in the usual sterile fashion.  After timeout was  conducted, incision was opened sharply, and Bovie electrocautery was used to dissected subcu tissue until the thoracodorsal fascia was identified and incised in the midline.  The last remaining spinous process was identified as T3 on the preoperative imaging.  The spinous process was identified, and subperiosteal dissection was carried out along the lamina of T3, to identify the pars, as well as the transverse process.  Scar tissue overlying the dura at the level of the previous laminectomy was then incised.  High-speed drill was then used to complete laminectomy at T3 to identify normal dura.  Dissection was then carried inferiorly to identify a plane between the scar tissue and the dural surface.  Scar tissue was dissected away from the lateral edges of the bony defects extending from T4-T5.  At this point, the high-speed drill was used to drill out the pars at T4, the right T4 pedicle was then skeletonized.  The T4 nerve root was also identified.  The inferior articulating process of T4 as well as the superior articulating process of T5 was then removed.  The pedicle was drilled down, until it was flush with the vertebral body.  I also removed the superior articulating process of T4.  At this point the microscope was draped sterilely and brought into the field, and the remainder of the case was done under the microscope using microdissection.  Hemostasis was initially achieved on the epidural surface with bipolar electrocautery.  The wound was then flooded with irrigation, and intraoperative ultrasound was used to confirm that there was a large ventral arachnoid cyst at the level of exposure, and there did appear to be a adhesion of the lateral portion of the thoracic spinal cord at approximately the level  of T4.  The dura was then opened sharply in a paramedian fashion on the right side.  Tack up stitches were placed.  Arachnoid was then dissected over the dorsal aspect of the dura and the lateral aspect  of the dura.  The exiting T4 nerve root was identified.  Arachnoid medial to the T4 root was incised, and we immediately encountered a large ventral arachnoid cyst.  Thickened arachnoid adhesions were then cut superiorly and inferiorly.  The thoracic spinal cord was also detethered both dorsally and laterally with microscissors and #11 blade scalpel.  At this point the intradural space was irrigated and inspected.  No active bleeding was identified.  Retention stitches were then removed, and the durotomy was closed with a running 4-0 Nurolon stitch.  Valsalva maneuver was performed and no active CSF leakage was seen.  The durotomy was then covered with polyethylene glycol sealant.  Self-retaining retractors were then removed, and the wound was irrigated with copious amounts of antibiotic saline.  The wound was then closed in multiple layers with interrupted 0 Vicryl stitches, and the skin was closed with standard surgical skin staples.  Bacitracin ointment and sterile dressing was applied.  The patient was then transferred to the stretcher, extubated, and taken to the post anesthesia care unit in stable hemodynamic condition.  At the end of the case all sponge, needle, instrument, and cottonoid counts were correct.

## 2017-06-03 NOTE — Anesthesia Preprocedure Evaluation (Addendum)
Anesthesia Evaluation  Patient identified by MRN, date of birth, ID band Patient awake    Reviewed: Allergy & Precautions, NPO status , Patient's Chart, lab work & pertinent test results, reviewed documented beta blocker date and time   Airway Mallampati: I  TM Distance: >3 FB Neck ROM: Full    Dental  (+) Dental Advisory Given   Pulmonary neg pulmonary ROS, neg sleep apnea, neg COPD,    breath sounds clear to auscultation       Cardiovascular hypertension, Pt. on medications and Pt. on home beta blockers + Peripheral Vascular Disease and + DVT  (-) Past MI  Rhythm:Regular Rate:Normal     Neuro/Psych Depression Hx brain aneurysm, [facility] 2016; wheelchair bound; bilateral lower extremity paresthesia and numbness CVA, Residual Symptoms    GI/Hepatic negative GI ROS, Neg liver ROS,   Endo/Other  Hypothyroidism Obesity  Renal/GU negative Renal ROS     Musculoskeletal   Abdominal   Peds  Hematology negative hematology ROS (+)   Anesthesia Other Findings   Reproductive/Obstetrics                            Lab Results  Component Value Date   WBC 7.7 06/03/2017   HGB 14.2 06/03/2017   HCT 42.7 06/03/2017   MCV 88.8 06/03/2017   PLT 435 (H) 06/03/2017   Lab Results  Component Value Date   CREATININE 0.81 05/26/2017   BUN 5 (L) 05/26/2017   NA 140 05/26/2017   K 3.9 05/26/2017   CL 107 05/26/2017   CO2 28 05/26/2017    Anesthesia Physical  Anesthesia Plan  ASA: III  Anesthesia Plan: General   Post-op Pain Management:    Induction: Intravenous  PONV Risk Score and Plan: 4 or greater and Treatment may vary due to age or medical condition, Ondansetron, Dexamethasone, Midazolam and Scopolamine patch - Pre-op  Airway Management Planned: Oral ETT  Additional Equipment: None  Intra-op Plan:   Post-operative Plan: Extubation in OR  Informed Consent: I have reviewed the patients  History and Physical, chart, labs and discussed the procedure including the risks, benefits and alternatives for the proposed anesthesia with the patient or authorized representative who has indicated his/her understanding and acceptance.   Dental advisory given  Plan Discussed with: CRNA  Anesthesia Plan Comments:        Anesthesia Quick Evaluation

## 2017-06-03 NOTE — Anesthesia Postprocedure Evaluation (Signed)
Anesthesia Post Note  Patient: Katrina Donaldson  Procedure(s) Performed: THORACIC THREE - THORACIC FIVE LAMINECTOMY, FENESTRATION OF ARACHNOID CYST (N/A Back)     Patient location during evaluation: PACU Anesthesia Type: General Level of consciousness: awake and alert Pain management: pain level controlled Vital Signs Assessment: post-procedure vital signs reviewed and stable Respiratory status: spontaneous breathing, nonlabored ventilation and respiratory function stable Cardiovascular status: blood pressure returned to baseline and stable Postop Assessment: no apparent nausea or vomiting Anesthetic complications: no    Last Vitals:  Vitals:   06/03/17 2027 06/03/17 2048  BP: 119/88 126/79  Pulse: 78 80  Resp: 16 16  Temp:  36.7 C  SpO2: 96% 93%    Last Pain:  Vitals:   06/03/17 2048  TempSrc: Oral  PainSc:                  Audry Pili

## 2017-06-04 ENCOUNTER — Other Ambulatory Visit: Payer: Self-pay

## 2017-06-04 ENCOUNTER — Encounter (HOSPITAL_COMMUNITY): Payer: Self-pay | Admitting: Neurosurgery

## 2017-06-04 LAB — BASIC METABOLIC PANEL
Anion gap: 10 (ref 5–15)
BUN: 10 mg/dL (ref 6–20)
CO2: 22 mmol/L (ref 22–32)
CREATININE: 0.98 mg/dL (ref 0.44–1.00)
Calcium: 8.4 mg/dL — ABNORMAL LOW (ref 8.9–10.3)
Chloride: 107 mmol/L (ref 101–111)
GFR calc Af Amer: >60 mL/min (ref >60)
GLUCOSE: 132 mg/dL — AB (ref 65–99)
Potassium: 4.7 mmol/L (ref 3.5–5.1)
SODIUM: 139 mmol/L (ref 135–145)

## 2017-06-04 LAB — CBC
HCT: 35.2 % — ABNORMAL LOW (ref 36.0–46.0)
Hemoglobin: 11.3 g/dL — ABNORMAL LOW (ref 12.0–15.0)
MCH: 29 pg (ref 26.0–34.0)
MCHC: 32.1 g/dL (ref 30.0–36.0)
MCV: 90.3 fL (ref 78.0–100.0)
PLATELETS: 361 10*3/uL (ref 150–400)
RBC: 3.9 MIL/uL (ref 3.87–5.11)
RDW: 14.6 % (ref 11.5–15.5)
WBC: 11.5 10*3/uL — ABNORMAL HIGH (ref 4.0–10.5)

## 2017-06-04 MED ORDER — PANTOPRAZOLE SODIUM 40 MG PO TBEC
40.0000 mg | DELAYED_RELEASE_TABLET | Freq: Every day | ORAL | Status: DC
  Administered 2017-06-04: 40 mg via ORAL
  Filled 2017-06-04: qty 1

## 2017-06-04 NOTE — Progress Notes (Signed)
No issues overnight. Pt reports no real back pain. No new complaints.  EXAM:  BP 118/70 (BP Location: Left Wrist)   Pulse (!) 114   Temp 97.8 F (36.6 C) (Oral)   Resp 18   Ht 5\' 5"  (1.651 m)   Wt 90.4 kg (199 lb 4.7 oz)   SpO2 100%   BMI 33.16 kg/m   Awake, alert, oriented  Speech fluent, appropriate  CN grossly intact  0-1/5 BLE  Wound c/d/i, no evidence of CSF leak  IMPRESSION:  61 y.o. female POD#1 T3-5 lami, fenestration of arachnoid cyst. At neurologic baseline (dense parapresis)  PLAN: - Cont supportive care - D/C Foley this pm - SW for SNF placement - pt prefers [facility]

## 2017-06-04 NOTE — NC FL2 (Signed)
[place] LEVEL OF CARE SCREENING TOOL     IDENTIFICATION  Patient Name: Katrina Donaldson Birthdate: 1955/08/28 Sex: female Admission Date (Current Location): 06/03/2017  [facility] and [place] Number:  Herbalist and Address:  [facility] [place], [place] [address] [place], [place] [address]      Provider Number: [id number]  Attending Physician Name and Address:  Consuella Lose, MD  Relative Name and Phone Number:       Current Level of Care: Hospital Recommended Level of Care: [place] Prior Approval Number:    Date Approved/Denied:   PASRR Number: [id number]  Discharge Plan: SNF    Current Diagnoses: Patient Active Problem List   Diagnosis Date Noted  . 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  . Chronic anticoagulation 05/26/2017  . Urinary retention   . Acute pain of right knee   . Acute lower UTI   . Weakness of right lower extremity   . Confusion   . Hydrocephalus   . Poor appetite   . Acute blood loss anemia   . Post-operative pain   . Hypokalemia   . Leukocytosis   . Thrombocytopenia ([place])   . Acute deep vein thrombosis (DVT) of femoral vein of left lower extremity ([place])   . Myelopathy ([place]) 07/24/2016  . Thoracic myelopathy   . Depression   . Benign essential HTN   . History of subarachnoid hemorrhage   . Hypercoagulable state ([place])   . Constipation due to pain medication   . 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 Protein-calorie malnutrition ([place]) 12/21/2014  . Thyroid activity decreased 12/21/2014  . 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 Essential hypertension 12/13/2014  . Anemia, iron deficiency 12/13/2014  . Hypothyroidism 12/13/2014  . Prediabetes 12/13/2014    Orientation RESPIRATION BLADDER Height & Weight     Self, Time, Situation, Place  Normal Continent, External catheter(catheter placed 12/4) Weight: 199 lb 4.7 oz (90.4 kg) Height:  5'  5" (165.1 cm)  BEHAVIORAL SYMPTOMS/MOOD NEUROLOGICAL BOWEL NUTRITION STATUS      Continent    AMBULATORY STATUS COMMUNICATION OF NEEDS Skin   Limited Assist Verbally Surgical wounds(closed back incision, 06/03/17, honeycomb dressing)                       Personal Care Assistance Level of Assistance  Bathing, Feeding, Dressing Bathing Assistance: Limited assistance Feeding assistance: Independent Dressing Assistance: Limited assistance     Functional Limitations Info  Sight, Hearing, Speech Sight Info: Adequate Hearing Info: Adequate Speech Info: Adequate    SPECIAL CARE FACTORS FREQUENCY  PT (By licensed PT), OT (By licensed OT)     PT Frequency: 5x/wk OT Frequency: 5x/wk            Contractures Contractures Info: Not present    Additional Factors Info  Code Status, Allergies, Psychotropic Code Status Info: Full Allergies Info: Shellfish Psychotropic Info: Paxil 20mg          Current Medications (06/04/2017):  This is the current hospital active medication list Current Facility-Administered Medications  Medication Dose Route Frequency Provider Last Rate Last Dose  . 0.9 %  sodium chloride infusion   Intravenous Continuous Consuella Lose, MD 75 mL/hr at 06/03/17 2032    . 0.9 %  sodium chloride infusion  250 mL Intravenous Continuous Consuella Lose, MD      . acetaminophen (TYLENOL) tablet 650 mg  650 mg Oral Q4H PRN Consuella Lose, MD  Or  . acetaminophen (TYLENOL) suppository 650 mg  650 mg Rectal Q4H PRN Consuella Lose, MD      . amLODipine (NORVASC) tablet 10 mg  10 mg Oral Daily Consuella Lose, MD      . baclofen (LIORESAL) tablet 10 mg  10 mg Oral TID PRN Consuella Lose, MD      . bisacodyl (DULCOLAX) suppository 10 mg  10 mg Rectal Daily PRN Consuella Lose, MD      . docusate sodium (COLACE) capsule 100 mg  100 mg Oral BID Consuella Lose, MD   100 mg at 06/04/17 0929  . fluticasone (FLONASE) 50 MCG/ACT nasal spray 1  spray  1 spray Each Nare Daily PRN Consuella Lose, MD      . ketorolac (TORADOL) 15 MG/ML injection 15 mg  15 mg Intravenous Q6H Consuella Lose, MD   15 mg at 06/04/17 0528  . levothyroxine (SYNTHROID, LEVOTHROID) tablet 100 mcg  100 mcg Oral QAC breakfast Consuella Lose, MD   100 mcg at 06/04/17 0730  . menthol-cetylpyridinium (CEPACOL) lozenge 3 mg  1 lozenge Oral PRN Consuella Lose, MD       Or  . phenol (CHLORASEPTIC) mouth spray 1 spray  1 spray Mouth/Throat PRN Consuella Lose, MD      . metoprolol tartrate (LOPRESSOR) tablet 100 mg  100 mg Oral BID Consuella Lose, MD   100 mg at 06/03/17 2254  . morphine 2 MG/ML injection 2 mg  2 mg Intravenous Q2H PRN Consuella Lose, MD      . multivitamin with minerals tablet 1 tablet  1 tablet Oral Daily Consuella Lose, MD   1 tablet at 06/04/17 0929  . ondansetron (ZOFRAN) tablet 4 mg  4 mg Oral Q6H PRN Consuella Lose, MD   4 mg at 06/03/17 2301   Or  . ondansetron (ZOFRAN) injection 4 mg  4 mg Intravenous Q6H PRN Consuella Lose, MD      . oxyCODONE (Oxy IR/ROXICODONE) immediate release tablet 10 mg  10 mg Oral Q3H PRN Consuella Lose, MD   10 mg at 06/03/17 2301  . oxyCODONE (Oxy IR/ROXICODONE) immediate release tablet 5 mg  5 mg Oral Q3H PRN Consuella Lose, MD      . pantoprazole (PROTONIX) EC tablet 40 mg  40 mg Oral QHS Consuella Lose, MD      . PARoxetine (PAXIL) tablet 20 mg  20 mg Oral Daily Consuella Lose, MD   20 mg at 06/04/17 0930  . senna (SENOKOT) tablet 8.6 mg  1 tablet Oral BID Consuella Lose, MD   8.6 mg at 06/04/17 0930  . sodium chloride flush (NS) 0.9 % injection 3 mL  3 mL Intravenous Q12H Nundkumar, Nena Polio, MD      . sodium chloride flush (NS) 0.9 % injection 3 mL  3 mL Intravenous PRN Consuella Lose, MD         Discharge Medications: Please see discharge summary for a list of discharge medications.  Relevant Imaging Results:  Relevant Lab  Results:   Additional Information SS#: [id number]  Geralynn Ochs, LCSW

## 2017-06-04 NOTE — Clinical Social Work Note (Signed)
Clinical Social Work Assessment  Patient Details  Name: Katrina Donaldson MRN: [id number] Date of Birth: 1956/02/06  Date of referral:  06/04/17               Reason for consult:  Facility Placement                Permission sought to share information with:  Facility Art therapist granted to share information::  Yes, Verbal Permission Granted  Name::        Agency::  SNF  Relationship::     Contact Information:     Housing/Transportation Living arrangements for the past 2 months:  Apartment Source of Information:  Patient Patient Interpreter Needed:  None Criminal Activity/Legal Involvement Pertinent to Current Situation/Hospitalization:  No - Comment as needed Significant Relationships:  Siblings, Adult Children Lives with:  Self Do you feel safe going back to the place where you live?  Yes Need for family participation in patient care:  No (Coment)  Care giving concerns:  Patient lives at home alone and will benefit from short term rehab at discharge to improve ability to independently care for self.   Social Worker assessment / plan:  CSW met with patient and confirmed that she wanted [facility] for SNF at discharge. CSW sent referral over to facility and confirmed bed availability. CSW alerted admissions coordinator that patient will need authorization through [facility] prior to admission; facility will initiate authorization request. CSW will continue to follow.  Employment status:  Retired Surveyor, [employer] Care PT Recommendations:  [place] / Referral to community resources:     Patient/Family's Response to care:  Patient agreeable to SNF placement.  Patient/Family's Understanding of and Emotional Response to Diagnosis, Current Treatment, and Prognosis:  Patient acknowledged that she had been to [facility] in the past in 2016, and the experience was ok. Patient discussed that for being in a nursing facility,  it was satisfactory. Patient was appreciative of CSW assistance and looking forward to getting well and going back home soon.  Emotional Assessment Appearance:  Appears stated age Attitude/Demeanor/Rapport:    Affect (typically observed):  Appropriate Orientation:  Oriented to Self, Oriented to Place, Oriented to  Time, Oriented to Situation Alcohol / Substance use:  Not Applicable Psych involvement (Current and /or in the community):  No (Comment)  Discharge Needs  Concerns to be addressed:  Care Coordination Readmission within the last 30 days:  No Current discharge risk:  Dependent with Mobility, Lives alone Barriers to Discharge:  Continued Medical Work up, [place], [place] 06/04/2017, 3:09 PM

## 2017-06-04 NOTE — Evaluation (Signed)
Occupational Therapy Evaluation Patient Details Name: Katrina Donaldson MRN: [id number] DOB: [birth year] Today's Date: 06/04/2017    History of Present Illness Pt is a 61 y.o. female s/p T3-5 Laminectomy, Fenestration of arachnoid cyst. PMHx: Brain aneurysm, Depression, Hx of DVT, HTN, Hypothyroidism, CVA.   Clinical Impression   Pt reports she required assist with ADL from caregiver and family PTA. Currently pt requires max assist +2 for lateral scoot transfer EOB to chair and mod-max assist overall for ADL. Recommending SNF for follow up to maximize independence and safety with ADL and functional mobility prior to return home. Pt would benefit from continued skilled OT to address established goals.    Follow Up Recommendations  SNF;Supervision/Assistance - 24 hour    Equipment Recommendations  Other (comment)(TBD)    Recommendations for Other Services       Precautions / Restrictions Precautions Precautions: Fall Restrictions Weight Bearing Restrictions: No      Mobility Bed Mobility Overal bed mobility: Needs Assistance Bed Mobility: Sidelying to Sit   Sidelying to sit: Max assist;+2 for physical assistance       General bed mobility comments: Pt already in sidelying upon arrival. Max assist to position LEs, bring to EOB, and assist for trunk elevation to sitting. Cues for hand placement and technique.  Transfers Overall transfer level: Needs assistance   Transfers: Lateral/Scoot Transfers          Lateral/Scoot Transfers: Max assist;+2 physical assistance General transfer comment: Max assist +2 with use of bed pad and blocking bil LEs for lateral scoot transfer EOB to chair going toward R side.    Balance Overall balance assessment: Needs assistance Sitting-balance support: Feet supported;Single extremity supported Sitting balance-Leahy Scale: Fair Sitting balance - Comments: Min assist for sitting balance                                   ADL  either performed or assessed with clinical judgement   ADL Overall ADL's : Needs assistance/impaired Eating/Feeding: Minimal assistance;Sitting Eating/Feeding Details (indicate cue type and reason): for sitting balance at EOB while taking morning meds Grooming: Minimal assistance;Sitting   Upper Body Bathing: Moderate assistance;Sitting   Lower Body Bathing: Maximal assistance;Bed level   Upper Body Dressing : Moderate assistance;Sitting   Lower Body Dressing: Maximal assistance;Bed level   Toilet Transfer: Maximal assistance;+2 for physical assistance(lateral scoot transfer) Toilet Transfer Details (indicate cue type and reason): Simulated by transfer EOB to chair going to R side         Functional mobility during ADLs: Maximal assistance;+2 for physical assistance(for lateral scoot with bed pad)       Vision   Vision Assessment?: No apparent visual deficits     Perception     Praxis      Pertinent Vitals/Pain Pain Assessment: 0-10 Pain Score: 7  Pain Location: back Pain Descriptors / Indicators: Discomfort;Sore Pain Intervention(s): Monitored during session;Repositioned     Hand Dominance     Extremity/Trunk Assessment Upper Extremity Assessment Upper Extremity Assessment: Overall WFL for tasks assessed   Lower Extremity Assessment Lower Extremity Assessment: Defer to PT evaluation   Cervical / Trunk Assessment Cervical / Trunk Assessment: Other exceptions Cervical / Trunk Exceptions: s/p spinal sx   Communication Communication Communication: No difficulties   Cognition Arousal/Alertness: Awake/alert Behavior During Therapy: WFL for tasks assessed/performed Overall Cognitive Status: Within Functional Limits for tasks assessed  General Comments       Exercises     Shoulder Instructions      Home Living Family/patient expects to be discharged to:: Skilled nursing facility Living Arrangements:  Alone                               Additional Comments: was living alone with [facility] aide M-F, son would stop in on saturday and sunday.      Prior Functioning/Environment Level of Independence: Needs assistance  Gait / Transfers Assistance Needed: Assist with transfers to w/c and commode via slide board transfer ADL's / Homemaking Assistance Needed: Assist for all ADL            OT Problem List: Decreased strength;Decreased activity tolerance;Impaired balance (sitting and/or standing);Decreased knowledge of use of DME or AE;Decreased knowledge of precautions;Impaired sensation;Impaired tone;Obesity;Pain      OT Treatment/Interventions: Self-care/ADL training;Therapeutic exercise;Energy conservation;DME and/or AE instruction;Therapeutic activities;Patient/family education;Balance training    OT Goals(Current goals can be found in the care plan section) Acute Rehab OT Goals Patient Stated Goal: go to rehab OT Goal Formulation: With patient Time For Goal Achievement: 06/18/17 Potential to Achieve Goals: Good ADL Goals Pt Will Perform Grooming: with supervision;sitting Pt Will Transfer to Toilet: squat pivot transfer;with min assist;bedside commode Pt Will Perform Toileting - Clothing Manipulation and hygiene: with min assist;sitting/lateral leans Additional ADL Goal #1: Pt will perform bed mobility with min guard assist as precursor to ADL.  OT Frequency: Min 2X/week   Barriers to D/C: Decreased caregiver support  pt lives alone       Co-evaluation PT/OT/SLP Co-Evaluation/Treatment: Yes Reason for Co-Treatment: Complexity of the patient's impairments (multi-system involvement);For patient/therapist safety   OT goals addressed during session: ADL's and self-care      AM-PAC PT "6 Clicks" Daily Activity     Outcome Measure Help from another person eating meals?: A Little Help from another person taking care of personal grooming?: A Little Help from another  person toileting, which includes using toliet, bedpan, or urinal?: A Lot Help from another person bathing (including washing, rinsing, drying)?: A Lot Help from another person to put on and taking off regular upper body clothing?: A Lot Help from another person to put on and taking off regular lower body clothing?: A Lot 6 Click Score: 14   End of Session Nurse Communication: Mobility status  Activity Tolerance: Patient tolerated treatment well Patient left: in chair;with call bell/phone within reach;with chair alarm set  OT Visit Diagnosis: Other abnormalities of gait and mobility (R26.89);Pain Pain - part of body: (back)                Time: 1761-6073 OT Time Calculation (min): 30 min Charges:  OT General Charges $OT Visit: 1 Visit OT Evaluation $OT Eval Moderate Complexity: 1 Mod G-Codes:     Matvey Llanas A. Ulice Brilliant, M.S., OTR/L Pager: [place] 06/04/2017, 10:08 AM

## 2017-06-04 NOTE — Evaluation (Signed)
Physical Therapy Evaluation Patient Details Name: Katrina Donaldson MRN: [id number] DOB: October 15, 1955 Today's Date: 06/04/2017   History of Present Illness  Pt is a 61 y.o. female s/p T3-5 Laminectomy, Fenestration of arachnoid cyst. PMHx: Brain aneurysm, Depression, Hx of DVT, HTN, Hypothyroidism, CVA.  Clinical Impression  Pt presents with bilat LE numbness, bilat LE 1/5, impaired sitting balance. Pt requiring maxA for all mobility at this time. Pt very motivated. Pt to benefit from SNF upon d/c for maximal rehab recovery.     Follow Up Recommendations SNF;Supervision/Assistance - 24 hour    Equipment Recommendations  None recommended by PT    Recommendations for Other Services       Precautions / Restrictions Precautions Precautions: Fall Restrictions Weight Bearing Restrictions: No      Mobility  Bed Mobility Overal bed mobility: Needs Assistance Bed Mobility: Sidelying to Sit   Sidelying to sit: Max assist;+2 for physical assistance       General bed mobility comments: Pt already in sidelying upon arrival. Max assist to position LEs, bring to EOB, and assist for trunk elevation to sitting. Cues for hand placement and technique.  Transfers Overall transfer level: Needs assistance Equipment used: Ambulation equipment used(2 person scoot with bed pad) Transfers: Lateral/Scoot Transfers          Lateral/Scoot Transfers: Max assist;+2 physical assistance General transfer comment: pt able to push up with bilat UEs and assist with transfer, maxA to maintain balance  Ambulation/Gait             General Gait Details: unable  Stairs            Wheelchair Mobility    Modified Rankin (Stroke Patients Only)       Balance Overall balance assessment: Needs assistance Sitting-balance support: Feet supported;Single extremity supported Sitting balance-Leahy Scale: Fair Sitting balance - Comments: minA to maintain mindline                                     Pertinent Vitals/Pain Pain Assessment: 0-10 Pain Score: 7  Pain Location: back Pain Descriptors / Indicators: Discomfort;Sore Pain Intervention(s): Monitored during session    Home Living Family/patient expects to be discharged to:: Skilled nursing facility Living Arrangements: Alone               Additional Comments: was living alone with [facility] aide Katrina-F, son would stop in on saturday and sunday.    Prior Function Level of Independence: Needs assistance   Gait / Transfers Assistance Needed: Assist with transfers to w/c and commode via slide board transfer  ADL's / Homemaking Assistance Needed: Assist for all ADL  Comments: drove and cooked and cleaned,  some falls a few months ago     Hand Dominance   Dominant Hand: Right    Extremity/Trunk Assessment   Upper Extremity Assessment Upper Extremity Assessment: Overall WFL for tasks assessed    Lower Extremity Assessment Lower Extremity Assessment: RLE deficits/detail;LLE deficits/detail RLE Deficits / Details: grossly 1/5, requires deep pressure, no light touch sensation RLE Sensation: decreased light touch RLE Coordination: decreased gross motor LLE Deficits / Details: grossly 1/5, requires deep pressure, no light touch sensation LLE Sensation: decreased light touch LLE Coordination: decreased gross motor    Cervical / Trunk Assessment Cervical / Trunk Assessment: Other exceptions(recent back surgery) Cervical / Trunk Exceptions: s/p spinal sx  Communication   Communication: No difficulties  Cognition Arousal/Alertness: Awake/alert Behavior During  Therapy: WFL for tasks assessed/performed Overall Cognitive Status: Within Functional Limits for tasks assessed                                        General Comments      Exercises     Assessment/Plan    PT Assessment Patient needs continued PT services  PT Problem List Decreased strength;Decreased activity tolerance;Decreased  range of motion;Decreased balance;Decreased mobility;Decreased coordination;Decreased cognition;Decreased knowledge of use of DME;Decreased safety awareness       PT Treatment Interventions DME instruction;Gait training;Stair training;Functional mobility training;Therapeutic activities;Therapeutic exercise;Balance training;Neuromuscular re-education    PT Goals (Current goals can be found in the Care Plan section)  Acute Rehab PT Goals Patient Stated Goal: go to rehab PT Goal Formulation: With patient Time For Goal Achievement: 06/18/17 Potential to Achieve Goals: Good    Frequency Min 3X/week   Barriers to discharge Decreased caregiver support lives alone    Co-evaluation PT/OT/SLP Co-Evaluation/Treatment: Yes Reason for Co-Treatment: Complexity of the patient's impairments (multi-system involvement) PT goals addressed during session: Mobility/safety with mobility OT goals addressed during session: ADL's and self-care       AM-PAC PT "6 Clicks" Daily Activity  Outcome Measure Difficulty turning over in bed (including adjusting bedclothes, sheets and blankets)?: Unable Difficulty moving from lying on back to sitting on the side of the bed? : Unable Difficulty sitting down on and standing up from a chair with arms (e.g., wheelchair, bedside commode, etc,.)?: Unable Help needed moving to and from a bed to chair (including a wheelchair)?: Total Help needed walking in hospital room?: Total Help needed climbing 3-5 steps with a railing? : Total 6 Click Score: 6    End of Session Equipment Utilized During Treatment: Gait belt Activity Tolerance: Patient tolerated treatment well Patient left: in chair;with chair alarm set;with call bell/phone within reach Nurse Communication: Mobility status PT Visit Diagnosis: Unsteadiness on feet (R26.81);Muscle weakness (generalized) (M62.81);Difficulty in walking, not elsewhere classified (R26.2)    Time: 0920-0950 PT Time Calculation (min)  (ACUTE ONLY): 30 min   Charges:   PT Evaluation $PT Eval Moderate Complexity: 1 Mod     PT G Codes:        Katrina Donaldson, PT, DPT Pager #: [phone] Office #: [phone]   Katrina Donaldson Katrina Donaldson 06/04/2017, 11:01 AM

## 2017-06-05 MED ORDER — OXYCODONE HCL 10 MG PO TABS: 10.0000 mg | ORAL_TABLET | Freq: Four times a day (QID) | ORAL | 0 refills | Status: DC | PRN

## 2017-06-05 NOTE — Clinical Social Work Placement (Signed)
Nurse to call report to [phone]  Transport set up for 4:00 PM.   CLINICAL SOCIAL WORK PLACEMENT  NOTE  Date:  06/05/2017  Patient Details  Name: Katrina Donaldson MRN: [id number] Date of Birth: Jun 21, 1956  Clinical Social Work is seeking post-discharge placement for this patient at the [place] level of care (*CSW will initial, date and re-position this form in  chart as items are completed):  Yes   Patient/family provided with [place] Work Department's list of facilities offering this level of care within the geographic area requested by the patient (or if unable, by the patient's family).  Yes   Patient/family informed of their freedom to choose among providers that offer the needed level of care, that participate in Medicare, Medicaid or managed care program needed by the patient, have an available bed and are willing to accept the patient.  Yes   Patient/family informed of [place]'s ownership interest in [facility]  and [facility], as well as of the fact that they are under no obligation to receive care at these facilities.  PASRR submitted to EDS on 06/04/17     PASRR number received on       Existing PASRR number confirmed on 06/04/17     FL2 transmitted to all facilities in geographic area requested by pt/family on       FL2 transmitted to all facilities within larger geographic area on 06/04/17     Patient informed that his/her managed care company has contracts with or will negotiate with certain facilities, including the following:        Yes   Patient/family informed of bed offers received.  Patient chooses bed at [facility]     Physician recommends and patient chooses bed at      Patient to be transferred to [facility] on 06/05/17.  Patient to be transferred to facility by PTAR     Patient family notified on   of transfer.  Name of family member notified:        PHYSICIAN       Additional Comment:     _______________________________________________ Geralynn Ochs, LCSW 06/05/2017, 1:06 PM

## 2017-06-05 NOTE — Discharge Instructions (Signed)
Can remove staples 1 week after discharge (between 10 and 14 days from surgery)

## 2017-06-05 NOTE — Care Management Note (Signed)
Case Management Note  Patient Details  Name: Malissia Rabbani MRN: [id number] Date of Birth: 06-11-1956  Subjective/Objective:                    Action/Plan: Pt discharging to [facility] today. No further needs per CM.  Expected Discharge Date:  06/05/17               Expected Discharge Plan:  Skilled Nursing Facility  In-House Referral:  Clinical Social Work  Discharge planning Services     Post Acute Care Choice:    Choice offered to:     DME Arranged:    DME Agency:     HH Arranged:    [place] Agency:     Status of Service:  Completed, signed off  If discussed at [employer], dates discussed:    Additional Comments:  Pollie Friar, RN 06/05/2017, 12:57 PM

## 2017-06-05 NOTE — Progress Notes (Signed)
No issues overnight. Pt reports no real back pain.  EXAM:  BP 112/60 (BP Location: Left Arm)   Pulse 69   Temp 98 F (36.7 C) (Oral)   Resp 18   Ht 5\' 5"  (1.651 m)   Wt 90.4 kg (199 lb 4.7 oz)   SpO2 99%   BMI 33.16 kg/m   Awake, alert, oriented  Speech fluent, appropriate  CN grossly intact  Unchanged dense paraparesis BLE Wound c/d/i  IMPRESSION:  61 y.o. female POD#2 fenestration of thoracic arachnoid cyst  PLAN: - stable for d/c when bed available

## 2017-06-05 NOTE — Progress Notes (Signed)
RN checked on incisional site and applied new dressing at this time. RN spoke to PA at this time and recheck dressing. No drainage noted. MD ok for patient o be discharge to SNF at this time. PTAR called.   Ave Filter, RN

## 2017-06-05 NOTE — Progress Notes (Signed)
Patient found to have large amount of serosanguinous drainage from incision when being turned to transfer to stretcher for discharge.  Dressing saturated and large amount on bedsheet.  Honeycomb dressing saturated and falling off.  Wound edges remain well approximated and no active drainage noted once dressing removed.  New gauze dressing applied to site and Dr. Kathyrn Sheriff was paged.  Transfer held until MD consulted. Pt denies any significant pain at this time.

## 2017-06-05 NOTE — Discharge Summary (Signed)
Physician Discharge Summary  Patient ID: Katrina Donaldson MRN: [id number] DOB/AGE: [birth year] 61 y.o.  Admit date: 06/03/2017 Discharge date: 06/05/2017  Admission Diagnoses:  Thoracic arachnoid cyst Thoracic myelopathy  Discharge Diagnoses:  Same Active Problems:   Arachnoid cyst of spine   Discharged Condition: Stable  Hospital Course:  Katrina Donaldson is a 61 y.o. female admitted after elected thoracic laminectomy for fenestration of arachnoid cyst. She was at baseline postop, with near complete paraplegia. She had minimal back pain. SNF discharge was recommended and she was discharged in stable condition.  Treatments: Surgery - T3-5 laminectomy, fenestration of arachnoid cyst  Discharge Exam: Blood pressure 112/60, pulse 69, temperature 98 F (36.7 C), temperature source Oral, resp. rate 18, height 5\' 5"  (1.651 m), weight 90.4 kg (199 lb 4.7 oz), SpO2 99 %. Awake, alert, oriented Speech fluent, appropriate CN grossly intact 5/5 BUE 0-1/5 BLE Wound c/d/i  Disposition: SNF  Wound Care Instructions: Can remove dressing in 24 hours, nothing needed after removal Can bathe Can remove staples on 06/17/17  Discharge Instructions    Call MD for:  redness, tenderness, or signs of infection (pain, swelling, redness, odor or green/yellow discharge around incision site)   Complete by:  As directed    Call MD for:  temperature >100.4   Complete by:  As directed    Diet - low sodium heart healthy   Complete by:  As directed    Discharge instructions   Complete by:  As directed    Walk at home as much as possible, at least 4 times / day   May shower / Bathe   Complete by:  As directed    48 hours after surgery   No dressing needed   Complete by:  As directed    Remove dressing in 24 hours   Complete by:  As directed      Allergies as of 06/05/2017      Reactions   Other Other (See Comments)   Allergic to shellfish such as shrimp      Medication List    STOP  taking these medications   oxyCODONE-acetaminophen 5-325 MG tablet Commonly known as:  PERCOCET/ROXICET     TAKE these medications   acetaminophen 500 MG tablet Commonly known as:  TYLENOL Take 500-1,000 mg by mouth every 6 (six) hours as needed for moderate pain or headache.   amLODipine 10 MG tablet Commonly known as:  NORVASC Take 1 tablet (10 mg total) by mouth daily.   ascorbic acid 250 MG tablet Commonly known as:  VITAMIN C Take 1 tablet (250 mg total) by mouth daily with breakfast.   baclofen 10 MG tablet Commonly known as:  LIORESAL Take 10 mg by mouth 3 (three) times daily as needed for muscle spasms.   ferrous sulfate 325 (65 FE) MG tablet Take 1 tablet (325 mg total) by mouth daily with breakfast.   fluticasone 50 MCG/ACT nasal spray Commonly known as:  FLONASE Place 1 spray into both nostrils daily as needed for allergies.   levothyroxine 100 MCG tablet Commonly known as:  SYNTHROID, LEVOTHROID Take 100 mcg by mouth daily before breakfast.   lidocaine 5 % Commonly known as:  LIDODERM Place 1 patch onto the skin daily. Remove & Discard patch within 12 hours or as directed by MD   methocarbamol 500 MG tablet Commonly known as:  ROBAXIN Take 1 tablet (500 mg total) by mouth every 6 (six) hours as needed for muscle spasms.   metoprolol tartrate 100  MG tablet Commonly known as:  LOPRESSOR Take 1 tablet (100 mg total) by mouth 2 (two) times daily.   multivitamin with minerals Tabs tablet Take 1 tablet by mouth daily.   omeprazole 20 MG capsule Commonly known as:  PRILOSEC Take 1 capsule (20 mg total) by mouth daily.   Oxycodone HCl 10 MG Tabs Take 1 tablet (10 mg total) by mouth every 6 (six) hours as needed for severe pain.   PARoxetine 20 MG tablet Commonly known as:  PAXIL Take 1 tablet (20 mg total) by mouth daily.   rivaroxaban 20 MG Tabs tablet Commonly known as:  XARELTO Take 1 tablet (20 mg total) by mouth daily with supper.        Contact information for follow-up providers    Consuella Lose, MD Follow up in 1 month(s).   Specialty:  Neurosurgery Contact information: [address] [place] [address] [phone]            Contact information for after-discharge care    Destination    [facility] Follow up.   Service:  Skilled Nursing Contact information: [place] [phone]                  Signed: Consuella Lose, Loletha Grayer 06/05/2017, 10:17 AM

## 2017-06-05 NOTE — Progress Notes (Signed)
I spoke with Mariana Single, PA to clarify that Ms. Hostler could still transfer to [facility].  He is aware that the initial drainage was a large amount of serosanguinous drainage - saturating her dressing and causing an approximately 18 inch round area of drainage on the sheet.  Upon inspection of the incision, there was no obvious ongoing drainage and none expressed with gentle pressure. A new dressing was applied.  It has not had further drainage in 1 hour.  Pt denies significant discomfort.  She had a headache earlier and was medicated several hours ago. I also notified Lucienne Capers at [facility] of the above events and to tell her to monitor the patient closely for further drainage.

## 2017-06-05 NOTE — Progress Notes (Signed)
Physical Therapy Treatment Patient Details Name: Katrina Donaldson MRN: [id number] DOB: 1955/09/25 Today's Date: 06/05/2017    History of Present Illness Pt is a 61 y.o. female s/p T3-5 Laminectomy, Fenestration of arachnoid cyst. PMHx: Brain aneurysm, Depression, Hx of DVT, HTN, Hypothyroidism, CVA.    PT Comments    Pt admitted with above diagnosis. Pt currently with functional limitations due to balance and endurance deficits. Pt was only able to perform exercises today as she needed to use bedpan.  Assisted onto bedpan with max assist and pt wanted privacy.  LEft pt on bedpan with call bell in hand.   Pt will benefit from skilled PT to increase their independence and safety with mobility to allow discharge to the venue listed below.     Follow Up Recommendations  SNF;Supervision/Assistance - 24 hour     Equipment Recommendations  None recommended by PT    Recommendations for Other Services       Precautions / Restrictions Precautions Precautions: Fall Restrictions Weight Bearing Restrictions: No    Mobility  Bed Mobility Overal bed mobility: Needs Assistance Bed Mobility: Rolling Rolling: Max assist;+2 for physical assistance         General bed mobility comments:  Max assist to position LEs for rolling with use of pad.  Pt does better if you give her time to reach for rail as she is sore all over.   Assist for trunk with pad.  Placed bed pan under pt as pt had to urinate.  Pt wanted privacy and had completed exercises therefore left pt on bedpan with call bell in reach.   Transfers                 General transfer comment: had to use bedpan.  Only performed exercises   Ambulation/Gait             General Gait Details: unable   Stairs            Wheelchair Mobility    Modified Rankin (Stroke Patients Only)       Balance                                            Cognition Arousal/Alertness: Awake/alert Behavior During  Therapy: WFL for tasks assessed/performed Overall Cognitive Status: Within Functional Limits for tasks assessed                                        Exercises General Exercises - Upper Extremity Shoulder Flexion: AAROM;Both;5 reps;Supine Shoulder Horizontal ADduction: Both;5 reps;Supine;AAROM General Exercises - Lower Extremity Ankle Circles/Pumps: AROM;Both;5 reps;Supine Quad Sets: AROM;Both;5 reps;Supine Heel Slides: AAROM;Both;5 reps;Supine Hip ABduction/ADduction: AAROM;Both;5 reps;Supine    General Comments General comments (skin integrity, edema, etc.): Tone noted in bil LEs.      Pertinent Vitals/Pain Pain Assessment: 0-10 Pain Score: 9  Pain Location: back, neck, LEs Pain Descriptors / Indicators: Discomfort;Sore Pain Intervention(s): Limited activity within patient's tolerance;Monitored during session;Premedicated before session;Repositioned    Home Living                      Prior Function            PT Goals (current goals can now be found in the care plan section) Progress towards PT goals:  Not progressing toward goals - comment(very weak and exercises only)    Frequency    Min 3X/week      PT Plan Current plan remains appropriate    Co-evaluation              AM-PAC PT "6 Clicks" Daily Activity  Outcome Measure  Difficulty turning over in bed (including adjusting bedclothes, sheets and blankets)?: Unable Difficulty moving from lying on back to sitting on the side of the bed? : Unable Difficulty sitting down on and standing up from a chair with arms (e.g., wheelchair, bedside commode, etc,.)?: Unable Help needed moving to and from a bed to chair (including a wheelchair)?: Total Help needed walking in hospital room?: Total Help needed climbing 3-5 steps with a railing? : Total 6 Click Score: 6    End of Session   Activity Tolerance: Patient limited by fatigue;Patient limited by pain Patient left: with call  bell/phone within reach;in bed;with SCD's reapplied Nurse Communication: Mobility status;Need for lift equipment PT Visit Diagnosis: Unsteadiness on feet (R26.81);Muscle weakness (generalized) (M62.81);Difficulty in walking, not elsewhere classified (R26.2)     Time: 1120-1130 PT Time Calculation (min) (ACUTE ONLY): 10 min  Charges:  $Therapeutic Exercise: 8-22 mins                    G Codes:       Kalin Kyler,PT Acute Rehabilitation [phone] (pager)    Denice Paradise 06/05/2017, 12:00 PM

## 2017-09-05 IMAGING — CT CT HEAD W/O CM
3 series · 15 of 47 positions shown, 18 images · non-contrast
Comparison: 12/07/2015 head CT.

CLINICAL DATA: 60-year-old hypertensive female with confusion.
History of brain aneurysm. Initial encounter.

EXAM:
CT HEAD WITHOUT CONTRAST
TECHNIQUE: Contiguous axial images were obtained from the base of the skull
through the vertex without intravenous contrast.

[Series 2: head 5.0 h30s · axial · 0.43mm/px · z∈[-148,-18]mm · 9 of 32 slices shown, 12 images]
[im 3/32  brain]
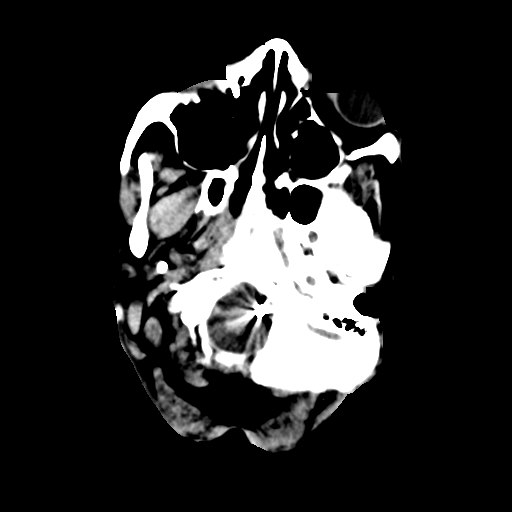
[im 3/32  bone]
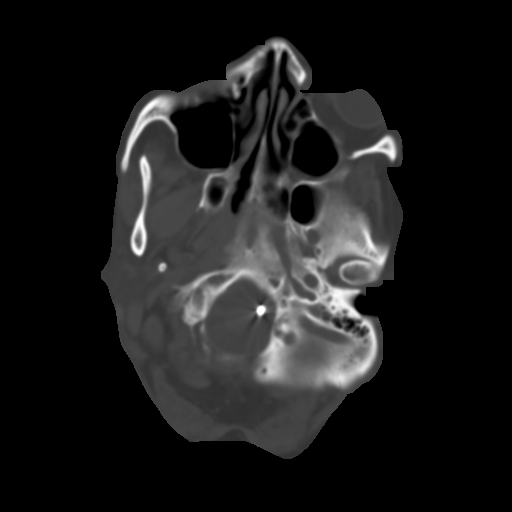
[im 6/32  brain]
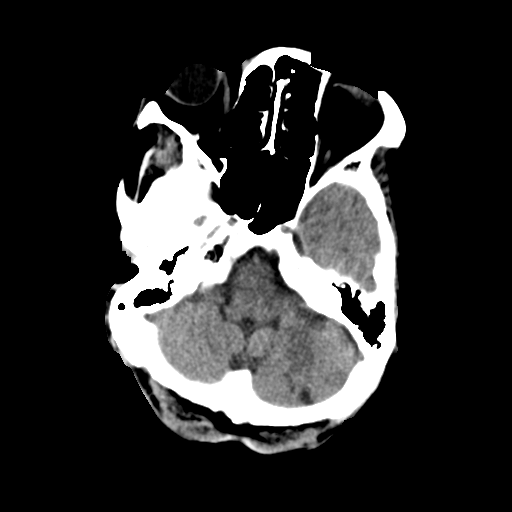
[im 9/32  brain]
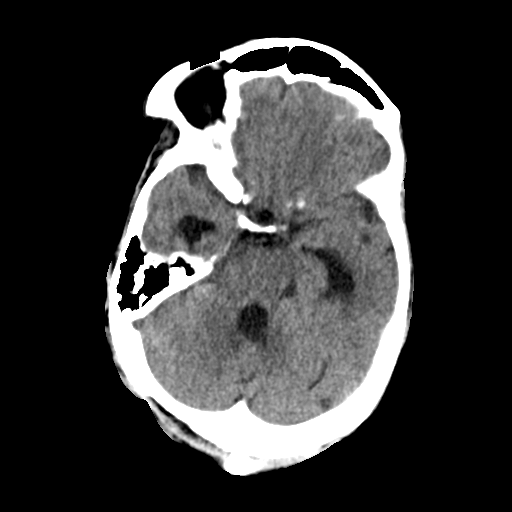
[im 12/32  brain]
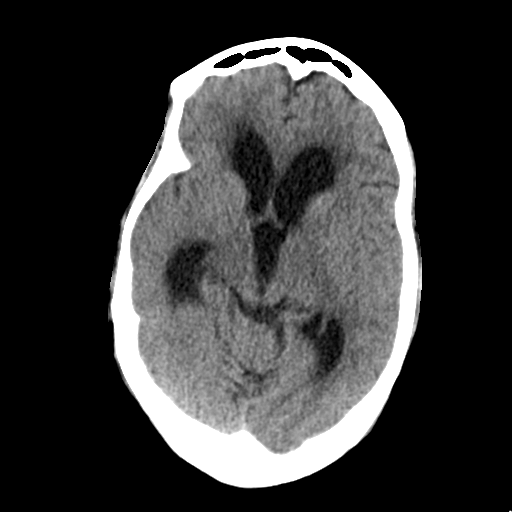
[im 17/32  brain]
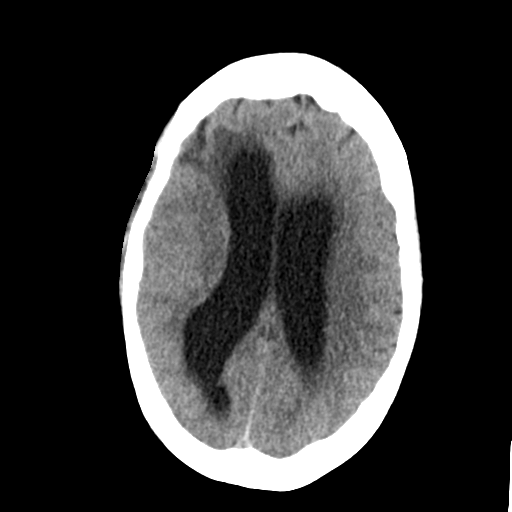
[im 17/32  bone]
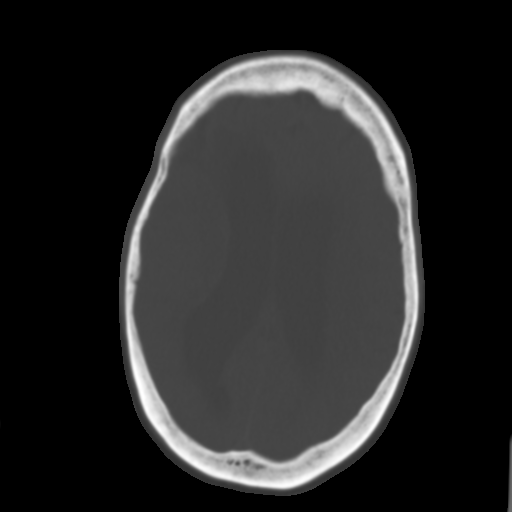
[im 20/32  brain]
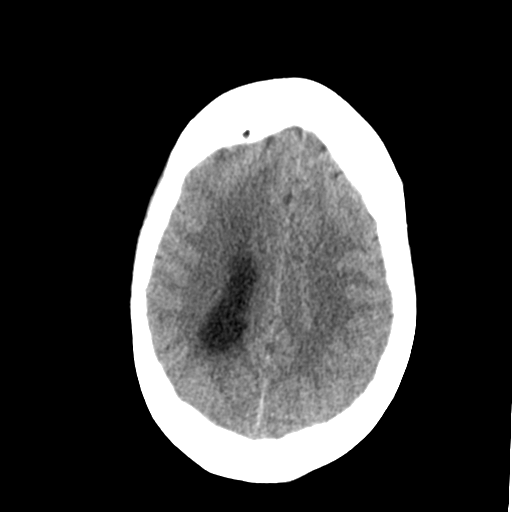
[im 23/32  brain]
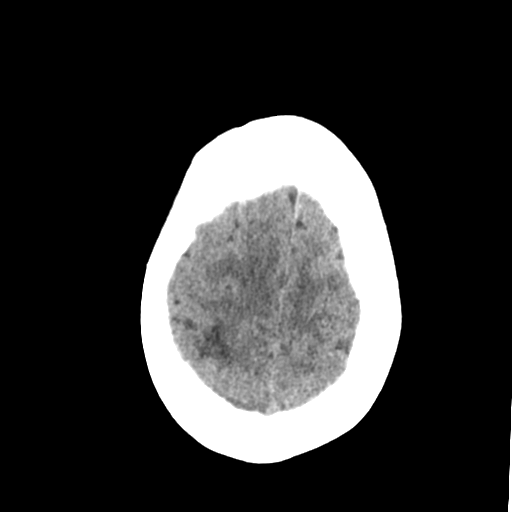
[im 26/32  brain]
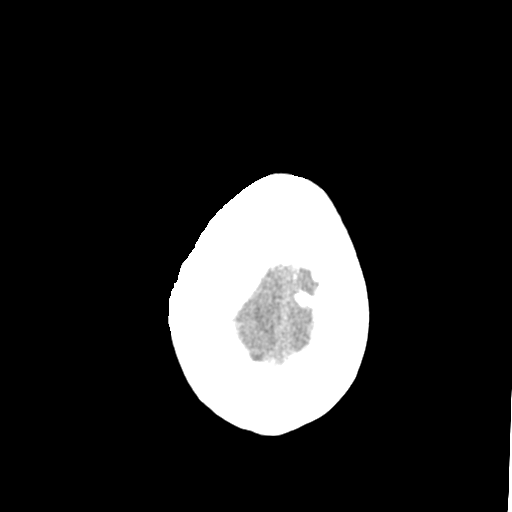
[im 29/32  brain]
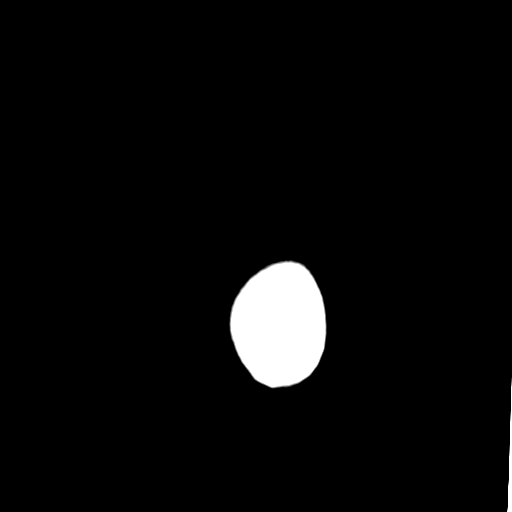
[im 29/32  bone]
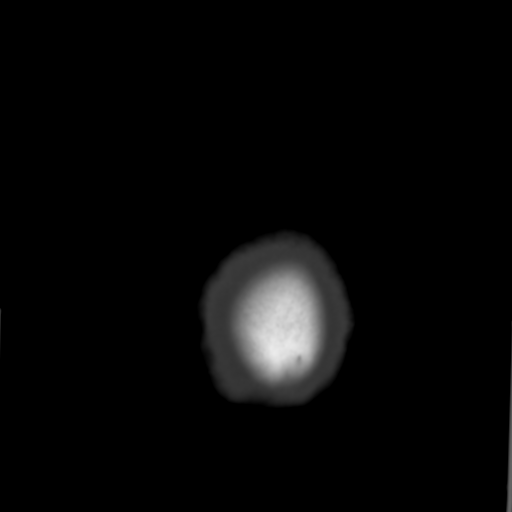

[Series 4: head 3.0 mpr cor · coronal · 0.30mm/px · 3 of 72 slices shown]
[im 24/72  brain]
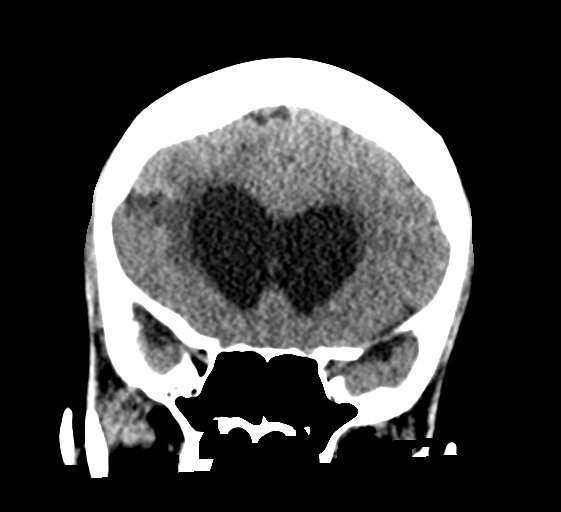
[im 32/72  brain]
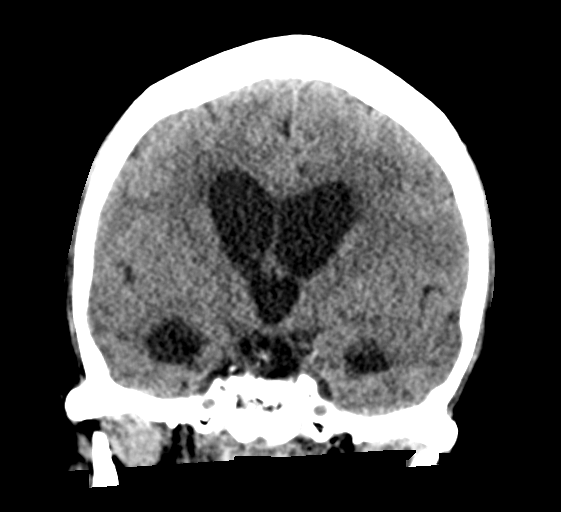
[im 40/72  brain]
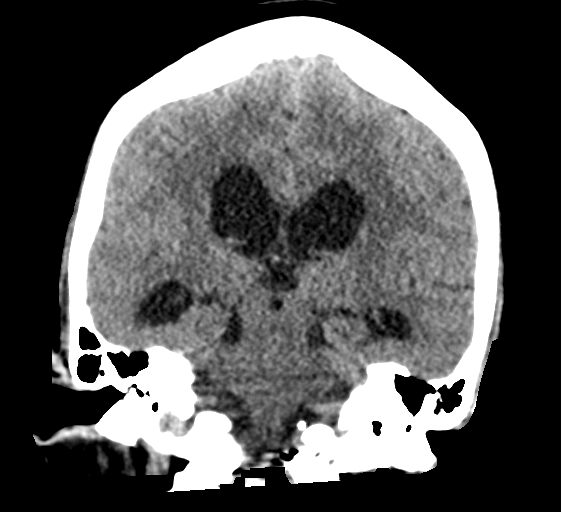

[Series 5: head 3.0 mpr sag · sagittal · 0.30mm/px · 3 of 52 slices shown]
[im 18/52  brain]
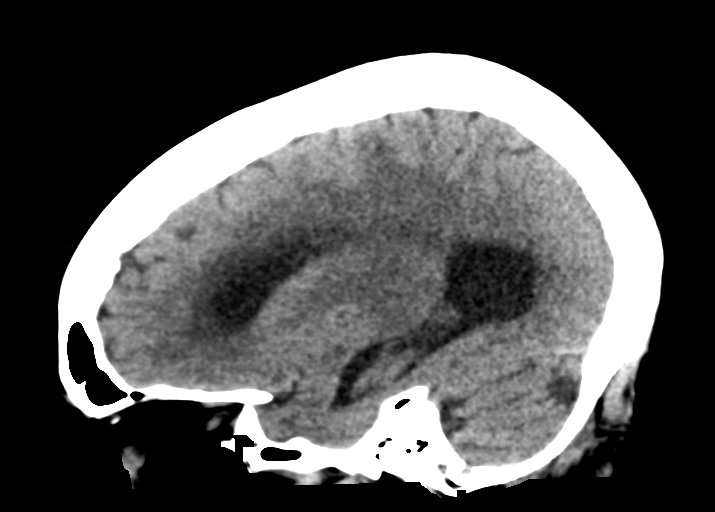
[im 26/52  brain]
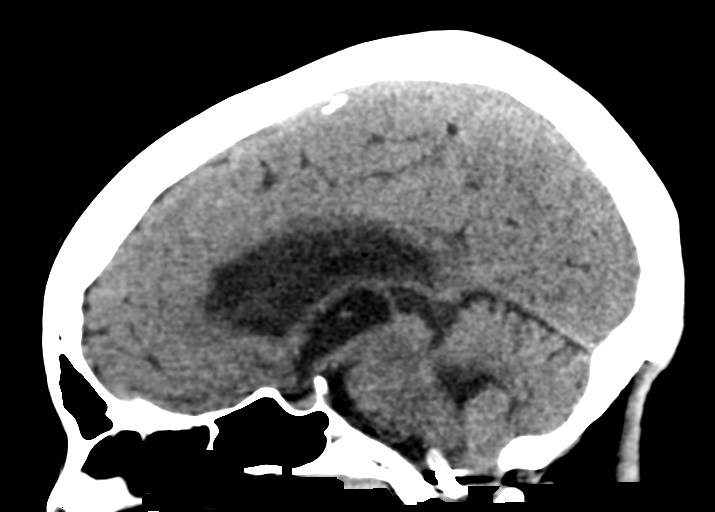
[im 35/52  brain]
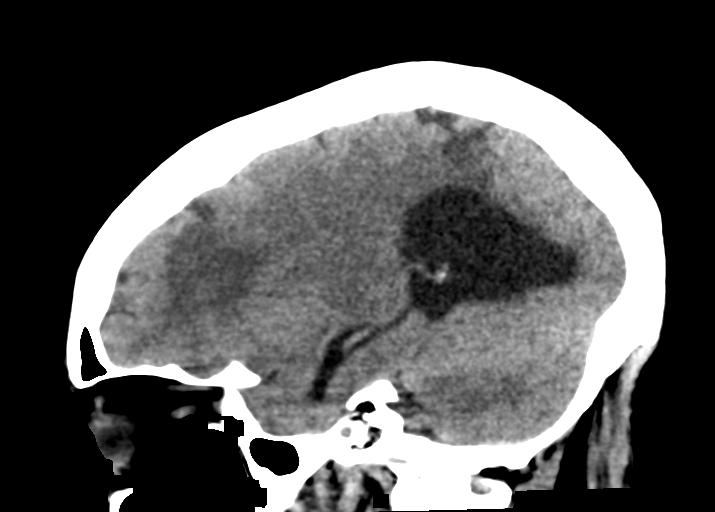

[15 of 47 positions shown; findings below may reference images not displayed]

FINDINGS: Brain: Post placement of right frontal shunt catheter which has been
removed. Interval development of moderate right-sided hydrocephalus.
Aqueduct appears patent.

Encephalomalacia right frontal lobe where patient had a prior
parenchymal hemorrhage.

Remote infarct left cerebellum without CT evidence of large acute
infarct.

No acute intracranial hemorrhage.

No intracranial mass lesion noted on this unenhanced exam.

Vascular: Post stenting and coiling left vertebral artery region
aneurysm with subsequent artifact.

Skull: No acute abnormality.

Sinuses/Orbits: No acute orbital abnormality.  Mild exophthalmos.

Visualized sinuses, mastoid air cells and middle ear cavities are
clear.

Other: Negative.
IMPRESSION: Interval development of moderate right-sided hydrocephalus. Aqueduct
appears patent.

Encephalomalacia right frontal lobe where patient had a prior
parenchymal hemorrhage.

Remote infarct left cerebellum without CT evidence of large acute
infarct.

No acute intracranial hemorrhage.

Post stenting and coiling left vertebral artery region aneurysm with
subsequent artifact.

## 2017-10-06 ENCOUNTER — Ambulatory Visit: Payer: BC Managed Care – PPO | Admitting: Oncology

## 2018-06-30 ENCOUNTER — Other Ambulatory Visit: Payer: Self-pay | Admitting: Oncology

## 2018-06-30 DIAGNOSIS — Z7901 Long term (current) use of anticoagulants: Secondary | ICD-10-CM

## 2018-06-30 DIAGNOSIS — IMO0001 Reserved for inherently not codable concepts without codable children: Secondary | ICD-10-CM

## 2018-06-30 DIAGNOSIS — I82509 Chronic embolism and thrombosis of unspecified deep veins of unspecified lower extremity: Principal | ICD-10-CM

## 2018-08-31 ENCOUNTER — Encounter: Payer: Self-pay | Admitting: *Deleted

## 2018-10-23 NOTE — Addendum Note (Signed)
Addended by: Hulan Fray on: 10/23/2018 04:33 PM   Modules accepted: Orders

## 2021-01-26 ENCOUNTER — Inpatient Hospital Stay (HOSPITAL_COMMUNITY)
Admission: EM | Admit: 2021-01-26 | Discharge: 2021-02-14 | DRG: 329 | Disposition: A | Discharge status: Skilled Nursing Facility | Payer: Medicare PPO | Source: Skilled Nursing Facility | Attending: Internal Medicine | Admitting: Internal Medicine

## 2021-01-26 DIAGNOSIS — I10 Essential (primary) hypertension: Secondary | ICD-10-CM | POA: Diagnosis present

## 2021-01-26 DIAGNOSIS — K5730 Diverticulosis of large intestine without perforation or abscess without bleeding: Secondary | ICD-10-CM | POA: Diagnosis present

## 2021-01-26 DIAGNOSIS — K449 Diaphragmatic hernia without obstruction or gangrene: Secondary | ICD-10-CM | POA: Diagnosis present

## 2021-01-26 DIAGNOSIS — E860 Dehydration: Secondary | ICD-10-CM | POA: Diagnosis present

## 2021-01-26 DIAGNOSIS — K219 Gastro-esophageal reflux disease without esophagitis: Secondary | ICD-10-CM | POA: Diagnosis present

## 2021-01-26 DIAGNOSIS — Z91013 Allergy to seafood: Secondary | ICD-10-CM

## 2021-01-26 DIAGNOSIS — G9589 Other specified diseases of spinal cord: Secondary | ICD-10-CM | POA: Diagnosis present

## 2021-01-26 DIAGNOSIS — Z713 Dietary counseling and surveillance: Secondary | ICD-10-CM

## 2021-01-26 DIAGNOSIS — F32A Depression, unspecified: Secondary | ICD-10-CM | POA: Diagnosis present

## 2021-01-26 DIAGNOSIS — K921 Melena: Secondary | ICD-10-CM | POA: Diagnosis present

## 2021-01-26 DIAGNOSIS — K6289 Other specified diseases of anus and rectum: Secondary | ICD-10-CM | POA: Diagnosis present

## 2021-01-26 DIAGNOSIS — K6389 Other specified diseases of intestine: Secondary | ICD-10-CM

## 2021-01-26 DIAGNOSIS — Z79890 Hormone replacement therapy (postmenopausal): Secondary | ICD-10-CM

## 2021-01-26 DIAGNOSIS — L89152 Pressure ulcer of sacral region, stage 2: Secondary | ICD-10-CM | POA: Diagnosis present

## 2021-01-26 DIAGNOSIS — E669 Obesity, unspecified: Secondary | ICD-10-CM | POA: Diagnosis present

## 2021-01-26 DIAGNOSIS — R935 Abnormal findings on diagnostic imaging of other abdominal regions, including retroperitoneum: Secondary | ICD-10-CM

## 2021-01-26 DIAGNOSIS — R1319 Other dysphagia: Secondary | ICD-10-CM | POA: Diagnosis present

## 2021-01-26 DIAGNOSIS — D120 Benign neoplasm of cecum: Secondary | ICD-10-CM | POA: Diagnosis present

## 2021-01-26 DIAGNOSIS — E890 Postprocedural hypothyroidism: Secondary | ICD-10-CM | POA: Diagnosis present

## 2021-01-26 DIAGNOSIS — R52 Pain, unspecified: Secondary | ICD-10-CM

## 2021-01-26 DIAGNOSIS — J9811 Atelectasis: Secondary | ICD-10-CM | POA: Diagnosis present

## 2021-01-26 DIAGNOSIS — Z20822 Contact with and (suspected) exposure to covid-19: Secondary | ICD-10-CM | POA: Diagnosis present

## 2021-01-26 DIAGNOSIS — L8990 Pressure ulcer of unspecified site, unspecified stage: Secondary | ICD-10-CM | POA: Insufficient documentation

## 2021-01-26 DIAGNOSIS — F05 Delirium due to known physiological condition: Secondary | ICD-10-CM | POA: Diagnosis not present

## 2021-01-26 DIAGNOSIS — D500 Iron deficiency anemia secondary to blood loss (chronic): Secondary | ICD-10-CM | POA: Diagnosis present

## 2021-01-26 DIAGNOSIS — D490 Neoplasm of unspecified behavior of digestive system: Secondary | ICD-10-CM | POA: Diagnosis not present

## 2021-01-26 DIAGNOSIS — D649 Anemia, unspecified: Secondary | ICD-10-CM | POA: Diagnosis not present

## 2021-01-26 DIAGNOSIS — K644 Residual hemorrhoidal skin tags: Secondary | ICD-10-CM | POA: Diagnosis present

## 2021-01-26 DIAGNOSIS — Z8673 Personal history of transient ischemic attack (TIA), and cerebral infarction without residual deficits: Secondary | ICD-10-CM

## 2021-01-26 DIAGNOSIS — Z86718 Personal history of other venous thrombosis and embolism: Secondary | ICD-10-CM

## 2021-01-26 DIAGNOSIS — K922 Gastrointestinal hemorrhage, unspecified: Secondary | ICD-10-CM | POA: Diagnosis present

## 2021-01-26 DIAGNOSIS — U071 COVID-19: Secondary | ICD-10-CM | POA: Diagnosis not present

## 2021-01-26 DIAGNOSIS — I2510 Atherosclerotic heart disease of native coronary artery without angina pectoris: Secondary | ICD-10-CM | POA: Diagnosis present

## 2021-01-26 DIAGNOSIS — Z7901 Long term (current) use of anticoagulants: Secondary | ICD-10-CM

## 2021-01-26 DIAGNOSIS — E876 Hypokalemia: Secondary | ICD-10-CM | POA: Diagnosis present

## 2021-01-26 DIAGNOSIS — D696 Thrombocytopenia, unspecified: Secondary | ICD-10-CM | POA: Diagnosis present

## 2021-01-26 DIAGNOSIS — Z993 Dependence on wheelchair: Secondary | ICD-10-CM

## 2021-01-26 DIAGNOSIS — K5900 Constipation, unspecified: Secondary | ICD-10-CM | POA: Diagnosis present

## 2021-01-26 DIAGNOSIS — E785 Hyperlipidemia, unspecified: Secondary | ICD-10-CM | POA: Diagnosis present

## 2021-01-26 DIAGNOSIS — M62838 Other muscle spasm: Secondary | ICD-10-CM | POA: Diagnosis present

## 2021-01-26 DIAGNOSIS — Z6832 Body mass index (BMI) 32.0-32.9, adult: Secondary | ICD-10-CM

## 2021-01-26 DIAGNOSIS — L239 Allergic contact dermatitis, unspecified cause: Secondary | ICD-10-CM | POA: Diagnosis not present

## 2021-01-26 DIAGNOSIS — D509 Iron deficiency anemia, unspecified: Secondary | ICD-10-CM | POA: Diagnosis not present

## 2021-01-26 DIAGNOSIS — F419 Anxiety disorder, unspecified: Secondary | ICD-10-CM | POA: Diagnosis present

## 2021-01-26 DIAGNOSIS — Z7401 Bed confinement status: Secondary | ICD-10-CM

## 2021-01-26 DIAGNOSIS — Z79899 Other long term (current) drug therapy: Secondary | ICD-10-CM

## 2021-01-26 DIAGNOSIS — Z8249 Family history of ischemic heart disease and other diseases of the circulatory system: Secondary | ICD-10-CM

## 2021-01-26 DIAGNOSIS — Z90710 Acquired absence of both cervix and uterus: Secondary | ICD-10-CM

## 2021-01-26 LAB — COMPREHENSIVE METABOLIC PANEL
ALT: 9 U/L (ref 0–44)
AST: 13 U/L — ABNORMAL LOW (ref 15–41)
Albumin: 3 g/dL — ABNORMAL LOW (ref 3.5–5.0)
Alkaline Phosphatase: 77 U/L (ref 38–126)
Anion gap: 7 (ref 5–15)
BUN: 7 mg/dL — ABNORMAL LOW (ref 8–23)
CO2: 25 mmol/L (ref 22–32)
Calcium: 8.3 mg/dL — ABNORMAL LOW (ref 8.9–10.3)
Chloride: 107 mmol/L (ref 98–111)
Creatinine, Ser: 0.67 mg/dL (ref 0.44–1.00)
GFR, Estimated: >60 mL/min (ref >60)
Glucose, Bld: 105 mg/dL — ABNORMAL HIGH (ref 70–99)
Potassium: 3 mmol/L — ABNORMAL LOW (ref 3.5–5.1)
Sodium: 139 mmol/L (ref 135–145)
Total Bilirubin: 0.3 mg/dL (ref 0.3–1.2)
Total Protein: 7.3 g/dL (ref 6.5–8.1)

## 2021-01-26 LAB — CBC WITH DIFFERENTIAL/PLATELET
Abs Immature Granulocytes: 0.07 10*3/uL (ref 0.00–0.07)
Basophils Absolute: 0 10*3/uL (ref 0.0–0.1)
Basophils Relative: 0 %
Eosinophils Absolute: 0.2 10*3/uL (ref 0.0–0.5)
Eosinophils Relative: 2 %
HCT: 11.6 % — ABNORMAL LOW (ref 36.0–46.0)
Hemoglobin: 3 g/dL — CL (ref 12.0–15.0)
Immature Granulocytes: 1 %
Lymphocytes Relative: 26 %
Lymphs Abs: 1.9 10*3/uL (ref 0.7–4.0)
MCH: 18 pg — ABNORMAL LOW (ref 26.0–34.0)
MCHC: 25.9 g/dL — ABNORMAL LOW (ref 30.0–36.0)
MCV: 69.5 fL — ABNORMAL LOW (ref 80.0–100.0)
Monocytes Absolute: 0.7 10*3/uL (ref 0.1–1.0)
Monocytes Relative: 10 %
Neutro Abs: 4.4 10*3/uL (ref 1.7–7.7)
Neutrophils Relative %: 61 %
Platelets: 275 10*3/uL (ref 150–400)
RBC: 1.67 MIL/uL — ABNORMAL LOW (ref 3.87–5.11)
RDW: 20.2 % — ABNORMAL HIGH (ref 11.5–15.5)
WBC: 7.3 10*3/uL (ref 4.0–10.5)
nRBC: 1 % — ABNORMAL HIGH (ref 0.0–0.2)

## 2021-01-26 LAB — POC OCCULT BLOOD, ED: Fecal Occult Bld: POSITIVE — AB

## 2021-01-26 LAB — PREPARE RBC (CROSSMATCH)

## 2021-01-26 MED ORDER — SODIUM CHLORIDE 0.9 % IV SOLN
10.0000 mL/h | Freq: Once | INTRAVENOUS | Status: AC
  Administered 2021-01-26: 10 mL/h via INTRAVENOUS

## 2021-01-26 MED ORDER — POTASSIUM CHLORIDE CRYS ER 20 MEQ PO TBCR
40.0000 meq | EXTENDED_RELEASE_TABLET | Freq: Two times a day (BID) | ORAL | Status: DC
  Filled 2021-01-26: qty 2

## 2021-01-26 NOTE — ED Provider Notes (Signed)
[facility] EMERGENCY DEPARTMENT Provider Note   CSN: [id number] Arrival date & time: 01/26/21  2030     History Chief Complaint  Patient presents with   Abnormal Labs    Katrina Donaldson is a 65 y.o. female.  Patient is a 65 year old female who has a history of prior subarachnoid hemorrhage, hypertension, chronic anticoagulation on Xarelto.  She had had a large arachnoid cyst that developed following the subarachnoid hemorrhage.  This resulted in progressive lower leg weakness.  Surgery was attempted but she had progressive lower leg weakness and now is chronically nonambulatory.  She presents from a nursing facility with low hemoglobin levels reported to be 3.  She says she does feel slightly more fatigued than normal.  She has not noticed any change in her stools but she does not see her stools.  She denies any abdominal pain.  No nausea or vomiting.  No fevers.  No other recent illnesses.  She does not remember having to get a blood transfusion in the past.      Past Medical History:  Diagnosis Date   Arachnoid cyst    Brain aneurysm    Chronic anticoagulation 05/26/2017   Depression    History of DVT (deep vein thrombosis)    Hypercoagulable state ([place])    Hypertension    Hypothyroidism    Stroke ([place]) 2016   [facility] 11/29/14   Wheelchair bound     Patient Active Problem List   Diagnosis Date Noted   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   Chronic anticoagulation 05/26/2017   Urinary retention    Acute pain of right knee    Acute lower UTI    Weakness of right lower extremity    Confusion    Hydrocephalus (HCC)    Poor appetite    Acute blood loss anemia    Post-operative pain    Hypokalemia    Leukocytosis    Thrombocytopenia (HCC)    Acute deep vein thrombosis (DVT) of femoral vein of left lower extremity (HCC)    Myelopathy ([place]) 07/24/2016   Thoracic myelopathy    Depression    Benign essential HTN    History of subarachnoid hemorrhage     Hypercoagulable state ([place])    Constipation due to pain medication    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   Protein-calorie malnutrition ([place]) 12/21/2014   Thyroid activity decreased 12/21/2014   SAH (subarachnoid hemorrhage), 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   Essential hypertension 12/13/2014   Anemia, iron deficiency 12/13/2014   Hypothyroidism 12/13/2014   Prediabetes 12/13/2014    Past Surgical History:  Procedure Laterality Date   ABDOMINAL HYSTERECTOMY     patient denies   BRAIN SURGERY     aneurysm   goiter     LAMINECTOMY N/A 07/19/2016   Procedure: LAMINECTOMY THORACIC FOUR THORACIC FIVE, THORACIC SIX TO THORACIC EIGHT,THORACIC TEN TO LUMBAR ONE, FENESTRATION OF ARACHNOID CYSTS;  Surgeon: Consuella Lose, MD;  Location: [place] [facility];  Service: Neurosurgery;  Laterality: N/A;   LAMINECTOMY N/A 06/03/2017   Procedure: THORACIC THREE - THORACIC FIVE LAMINECTOMY, FENESTRATION OF ARACHNOID CYST;  Surgeon: Consuella Lose, MD;  Location: [place];  Service: Neurosurgery;  Laterality: N/A;   THYROIDECTOMY     TUBAL LIGATION       OB History   No obstetric history on file.     No family history on file.  Social History   Tobacco Use   Smoking status: Never   Smokeless tobacco: Never  Vaping Use  Vaping Use: Never used  Substance Use Topics   Alcohol use: No    Alcohol/week: 0.0 standard drinks   Drug use: No    Home Medications Prior to Admission medications   Medication Sig Start Date End Date Taking? Authorizing Provider  acetaminophen (TYLENOL) 500 MG tablet Take 500-1,000 mg by mouth every 6 (six) hours as needed for moderate pain or headache.    [provider]  amLODipine (NORVASC) 10 MG tablet Take 1 tablet (10 mg total) by mouth daily. 08/23/16   Angiulli, Lavon Paganini, PA-C  baclofen (LIORESAL) 10 MG tablet Take 10 mg by mouth 3 (three) times daily as needed for muscle spasms.  05/09/17   [provider]  ferrous sulfate 325 (65 FE)  MG tablet Take 1 tablet (325 mg total) by mouth daily with breakfast. Patient not taking: Reported on 05/29/2017 08/24/16   Angiulli, Lavon Paganini, PA-C  fluticasone (FLONASE) 50 MCG/ACT nasal spray Place 1 spray into both nostrils daily as needed for allergies.     [provider]  levothyroxine (SYNTHROID, LEVOTHROID) 100 MCG tablet Take 100 mcg by mouth daily before breakfast.    [provider]  lidocaine (LIDODERM) 5 % Place 1 patch onto the skin daily. Remove & Discard patch within 12 hours or as directed by MD Patient not taking: Reported on 05/29/2017 08/24/16   Angiulli, Lavon Paganini, PA-C  methocarbamol (ROBAXIN) 500 MG tablet Take 1 tablet (500 mg total) by mouth every 6 (six) hours as needed for muscle spasms. Patient not taking: Reported on 05/29/2017 08/23/16   Angiulli, Lavon Paganini, PA-C  metoprolol (LOPRESSOR) 100 MG tablet Take 1 tablet (100 mg total) by mouth 2 (two) times daily. 08/23/16   Angiulli, Lavon Paganini, PA-C  Multiple Vitamin (MULTIVITAMIN WITH MINERALS) TABS tablet Take 1 tablet by mouth daily. 08/24/16   Angiulli, Lavon Paganini, PA-C  omeprazole (PRILOSEC) 20 MG capsule Take 1 capsule (20 mg total) by mouth daily. Patient not taking: Reported on 05/29/2017 08/23/16   Angiulli, Lavon Paganini, PA-C  oxyCODONE 10 MG TABS Take 1 tablet (10 mg total) by mouth every 6 (six) hours as needed for severe pain. 06/05/17   Consuella Lose, MD  PARoxetine (PAXIL) 20 MG tablet Take 1 tablet (20 mg total) by mouth daily. 08/23/16   Angiulli, Lavon Paganini, PA-C  rivaroxaban (XARELTO) 20 MG TABS tablet Take 1 tablet (20 mg total) by mouth daily with supper. 08/23/16   Angiulli, Lavon Paganini, PA-C  vitamin C (VITAMIN C) 250 MG tablet Take 1 tablet (250 mg total) by mouth daily with breakfast. Patient not taking: Reported on 05/29/2017 08/24/16   Angiulli, Lavon Paganini, PA-C    Allergies    Other  Review of Systems   Review of Systems  Constitutional:  Positive for fatigue. Negative for chills, diaphoresis  and fever.  HENT:  Negative for congestion, rhinorrhea and sneezing.   Eyes: Negative.   Respiratory:  Negative for cough, chest tightness and shortness of breath.   Cardiovascular:  Negative for chest pain and leg swelling.  Gastrointestinal:  Negative for abdominal pain, blood in stool, diarrhea, nausea and vomiting.  Genitourinary:  Negative for difficulty urinating, flank pain, frequency and hematuria.  Musculoskeletal:  Negative for arthralgias and back pain.  Skin:  Negative for rash.  Neurological:  Positive for dizziness (Chronic in nature) and weakness (Lower extremity paralysis). Negative for speech difficulty, numbness and headaches.   Physical Exam Updated Vital Signs BP 133/71   Pulse 89   Temp  98.3 F (36.8 C) (Oral)   Resp 19   SpO2 99%   Physical Exam Constitutional:      Appearance: She is well-developed.  HENT:     Head: Normocephalic and atraumatic.  Eyes:     Pupils: Pupils are equal, round, and reactive to light.     Comments: Pale conjunctiva  Cardiovascular:     Rate and Rhythm: Normal rate and regular rhythm.     Heart sounds: Normal heart sounds.  Pulmonary:     Effort: Pulmonary effort is normal. No respiratory distress.     Breath sounds: Normal breath sounds. No wheezing or rales.  Chest:     Chest wall: No tenderness.  Abdominal:     General: Bowel sounds are normal.     Palpations: Abdomen is soft.     Tenderness: There is no abdominal tenderness. There is no guarding or rebound.  Genitourinary:    Comments: Brown/maroon colored stool, pressure sore to her sacrum with dressing in place.  No signs of surrounding cellulitis.  No obvious irritation or bleeding from her peroneum Musculoskeletal:        General: Normal range of motion.     Cervical back: Normal range of motion and neck supple.  Lymphadenopathy:     Cervical: No cervical adenopathy.  Skin:    General: Skin is warm and dry.     Findings: No rash.  Neurological:     Mental  Status: She is alert and oriented to person, place, and time.    ED Results / Procedures / Treatments   Labs (all labs ordered are listed, but only abnormal results are displayed) Labs Reviewed  CBC WITH DIFFERENTIAL/PLATELET - Abnormal; Notable for the following components:      Result Value   RBC 1.67 (*)    Hemoglobin 3.0 (*)    HCT 11.6 (*)    MCV 69.5 (*)    MCH 18.0 (*)    MCHC 25.9 (*)    RDW 20.2 (*)    nRBC 1.0 (*)    All other components within normal limits  COMPREHENSIVE METABOLIC PANEL - Abnormal; Notable for the following components:   Potassium 3.0 (*)    Glucose, Bld 105 (*)    BUN 7 (*)    Calcium 8.3 (*)    Albumin 3.0 (*)    AST 13 (*)    All other components within normal limits  POC OCCULT BLOOD, ED - Abnormal; Notable for the following components:   Fecal Occult Bld POSITIVE (*)    All other components within normal limits  SARS CORONAVIRUS 2 (TAT 6-24 HRS)  TYPE AND SCREEN  PREPARE RBC (CROSSMATCH)    EKG EKG Interpretation  Date/Time:  Friday January 26 2021 21:01:35 EDT Ventricular Rate:  84 PR Interval:  162 QRS Duration: 95 QT Interval:  399 QTC Calculation: 472 R Axis:   30 Text Interpretation: Sinus rhythm Abnormal R-wave progression, early transition Repol abnrm suggests ischemia, anterolateral Confirmed by Malvin Johns [phone]) on 01/26/2021 9:22:38 PM  Radiology No results found.  Procedures Procedures   Medications Ordered in ED Medications  0.9 %  sodium chloride infusion (10 mL/hr Intravenous New Bag/Given 01/26/21 2300)    ED Course  I have reviewed the triage vital signs and the nursing notes.  Pertinent labs & imaging results that were available during my care of the patient were reviewed by me and considered in my medical decision making (see chart for details).    MDM Rules/Calculators/A&P  Patient is a 65 year old female who presents with low hemoglobin.  Her hemoglobin is in fact 3.  Her  blood pressure is stable.  She does not have any associated domino pain.  Her rectal exam does show some maroon-colored stool which is likely the source of the blood loss.  She was typed and crossed for 2 units of packed red cells.  I spoke with Dr. Nevada Crane who will admit the patient for further treatment.  CRITICAL CARE Performed by: Malvin Johns Total critical care time: 60 minutes Critical care time was exclusive of separately billable procedures and treating other patients. Critical care was necessary to treat or prevent imminent or life-threatening deterioration. Critical care was time spent personally by me on the following activities: development of treatment plan with patient and/or surrogate as well as nursing, discussions with consultants, evaluation of patient's response to treatment, examination of patient, obtaining history from patient or surrogate, ordering and performing treatments and interventions, ordering and review of laboratory studies, ordering and review of radiographic studies, pulse oximetry and re-evaluation of patient's condition.  Final Clinical Impression(s) / ED Diagnoses Final diagnoses:  Anemia, unspecified type  Gastrointestinal hemorrhage, unspecified gastrointestinal hemorrhage type    Rx / DC Orders ED Discharge Orders     None        Malvin Johns, MD 01/26/21 2302

## 2021-01-26 NOTE — ED Provider Notes (Signed)
Emergency Medicine Provider Triage Evaluation Note  Katrina Donaldson , a 65 y.o. female  was evaluated in triage.  Pt complains of abnormal labs. Pt noted to have hgb 3 at facility.  Review of Systems  Positive: Abn labs Negative: Abd pain, bloody stool, chest pain, sob  Physical Exam  BP 100/65 (BP Location: Left Arm)   Pulse 86   Temp 98.8 F (37.1 C) (Oral)   Resp 18   SpO2 100%  Gen:   Awake, no distress   Resp:  Normal effort  MSK:   Moves extremities without difficulty   Medical Decision Making  Medically screening exam initiated at 8:39 PM.  Appropriate orders placed.  Katrina Donaldson was informed that the remainder of the evaluation will be completed by another provider, this initial triage assessment does not replace that evaluation, and the importance of remaining in the ED until their evaluation is complete.  Nursing advised pt needs to be prioritized for the next room   Bishop [place] 01/26/21 2040    Blanchie Dessert, MD 01/26/21 2232

## 2021-01-26 NOTE — H&P (Signed)
History and Physical  Katrina Donaldson [id number] DOB: 08-26-1955 DOA: 01/26/2021  Referring physician: Dr. Tamera Punt, [place]. PCP: Donald Prose, MD  Outpatient Specialists: Neurology. Patient coming from: SNF, [facility]  Chief Complaint: Abnormal lab.  HPI: Katrina Donaldson is a 65 y.o. female with medical history significant for thoracic myelopathy, wheelchair dependent, subarachnoid hemorrhage, right lower extremity DVT on Xarelto, hypertension, iron deficiency anemia, hypothyroidism, chronic anxiety/depression, who presented to [facility] ED at the recommendation of her primary care provider due to abnormal lab.  She was found to have a hemoglobin of 3.0.  She endorses generalized fatigue for weeks, she thought it was due to her gabapentin, gradually worsening and associated with dizziness for the past couple of days.  States she vomited x1 on the day prior to presentation.  No abdominal pain.  No use of NSAIDs.  Constipation, last bowel movement was a week ago.  Denies chest pain palpitations or shortness of breath.  History is mainly obtained from the patient who is a poor historian, as well as from [place], and review of medical records.  Has had unintentional weight loss 10 to 15 pounds in less than 6 months.  She presented to the ED for further evaluation.  While in the ED rectal exam showed brown stool mixed with maroon stool.  Hemoglobin of 3.0 was confirmed with positive FOBT.  2 units PRBCs ordered by EDP to be transfused.  Patient admitted to hospitalist service.   ED Course:  Temperature 98.5.  BP 128/76, pulse 87, respiration rate 19, O2 saturation 96% on room air.  Lab studies significant for WBC 7.3, hemoglobin 3.0, MCV 69.5, platelet count 275.  Serum sodium 139, potassium 3.0, serum bicarb 25, glucose 105, BUN 7, creatinine 0.67.  Positive FOBT.  Review of Systems: Review of systems as noted in the HPI. All other systems reviewed and are negative.   Past Medical History:  Diagnosis Date    Arachnoid cyst    Brain aneurysm    Chronic anticoagulation 05/26/2017   Depression    History of DVT (deep vein thrombosis)    Hypercoagulable state ([place])    Hypertension    Hypothyroidism    Stroke [facility]) 2016   [facility] 11/29/14   Wheelchair bound    Past Surgical History:  Procedure Laterality Date   ABDOMINAL HYSTERECTOMY     patient denies   BRAIN SURGERY     aneurysm   goiter     LAMINECTOMY N/A 07/19/2016   Procedure: LAMINECTOMY THORACIC FOUR THORACIC FIVE, THORACIC SIX TO THORACIC EIGHT,THORACIC TEN TO LUMBAR ONE, FENESTRATION OF ARACHNOID CYSTS;  Surgeon: Consuella Lose, MD;  Location: [place];  Service: Neurosurgery;  Laterality: N/A;   LAMINECTOMY N/A 06/03/2017   Procedure: THORACIC THREE - THORACIC FIVE LAMINECTOMY, FENESTRATION OF ARACHNOID CYST;  Surgeon: Consuella Lose, MD;  Location: [place];  Service: Neurosurgery;  Laterality: N/A;   THYROIDECTOMY     TUBAL LIGATION      Social History:  reports that she has never smoked. She has never used smokeless tobacco. She reports that she does not drink alcohol and does not use drugs.   Allergies  Allergen Reactions   Other Other (See Comments)    Allergic to shellfish such as shrimp     Family history: Father with history of MI Mother with history of MI.  Prior to Admission medications   Medication Sig Start Date End Date Taking? Authorizing Provider  acetaminophen (TYLENOL) 500 MG tablet Take 500-1,000 mg by mouth every 6 (six)  hours as needed for moderate pain or headache.    [provider]  amLODipine (NORVASC) 10 MG tablet Take 1 tablet (10 mg total) by mouth daily. 08/23/16   Angiulli, Lavon Paganini, PA-C  baclofen (LIORESAL) 10 MG tablet Take 10 mg by mouth 3 (three) times daily as needed for muscle spasms.  05/09/17   [provider]  ferrous sulfate 325 (65 FE) MG tablet Take 1 tablet (325 mg total) by mouth daily with breakfast. Patient not taking: Reported on 05/29/2017 08/24/16   Angiulli,  Lavon Paganini, PA-C  fluticasone (FLONASE) 50 MCG/ACT nasal spray Place 1 spray into both nostrils daily as needed for allergies.     [provider]  levothyroxine (SYNTHROID, LEVOTHROID) 100 MCG tablet Take 100 mcg by mouth daily before breakfast.    [provider]  lidocaine (LIDODERM) 5 % Place 1 patch onto the skin daily. Remove & Discard patch within 12 hours or as directed by MD Patient not taking: Reported on 05/29/2017 08/24/16   Angiulli, Lavon Paganini, PA-C  methocarbamol (ROBAXIN) 500 MG tablet Take 1 tablet (500 mg total) by mouth every 6 (six) hours as needed for muscle spasms. Patient not taking: Reported on 05/29/2017 08/23/16   Angiulli, Lavon Paganini, PA-C  metoprolol (LOPRESSOR) 100 MG tablet Take 1 tablet (100 mg total) by mouth 2 (two) times daily. 08/23/16   Angiulli, Lavon Paganini, PA-C  Multiple Vitamin (MULTIVITAMIN WITH MINERALS) TABS tablet Take 1 tablet by mouth daily. 08/24/16   Angiulli, Lavon Paganini, PA-C  omeprazole (PRILOSEC) 20 MG capsule Take 1 capsule (20 mg total) by mouth daily. Patient not taking: Reported on 05/29/2017 08/23/16   Angiulli, Lavon Paganini, PA-C  oxyCODONE 10 MG TABS Take 1 tablet (10 mg total) by mouth every 6 (six) hours as needed for severe pain. 06/05/17   Consuella Lose, MD  PARoxetine (PAXIL) 20 MG tablet Take 1 tablet (20 mg total) by mouth daily. 08/23/16   Angiulli, Lavon Paganini, PA-C  rivaroxaban (XARELTO) 20 MG TABS tablet Take 1 tablet (20 mg total) by mouth daily with supper. 08/23/16   Angiulli, Lavon Paganini, PA-C  vitamin C (VITAMIN C) 250 MG tablet Take 1 tablet (250 mg total) by mouth daily with breakfast. Patient not taking: Reported on 05/29/2017 08/24/16   Angiulli, Lavon Paganini, PA-C    Physical Exam: BP 131/65   Pulse 87   Temp 98.5 F (36.9 C) (Oral)   Resp 19   SpO2 99%   General: 65 y.o. year-old female well developed well nourished in no acute distress.  Alert and oriented x3. Cardiovascular: Regular rate and rhythm with no rubs or  gallops.  No thyromegaly or JVD noted.  No lower extremity edema. 2/4 pulses in all 4 extremities. Respiratory: Clear to auscultation with no wheezes or rales. Good inspiratory effort. Abdomen: Soft nontender nondistended with normal bowel sounds x4 quadrants. Muskuloskeletal: No cyanosis, clubbing or edema noted bilaterally Neuro: CN II-XII intact, strength, sensation, reflexes Skin: No ulcerative lesions noted or rashes Psychiatry: Judgement and insight appear normal. Mood is appropriate for condition and setting          Labs on Admission:  Basic Metabolic Panel: Recent Labs  Lab 01/26/21 2045  NA 139  K 3.0*  CL 107  CO2 25  GLUCOSE 105*  BUN 7*  CREATININE 0.67  CALCIUM 8.3*   Liver Function Tests: Recent Labs  Lab 01/26/21 2045  AST 13*  ALT 9  ALKPHOS 77  BILITOT 0.3  PROT 7.3  ALBUMIN 3.0*   No results for input(s): LIPASE, AMYLASE in the last 168 hours. No results for input(s): AMMONIA in the last 168 hours. CBC: Recent Labs  Lab 01/26/21 2045  WBC 7.3  NEUTROABS 4.4  HGB 3.0*  HCT 11.6*  MCV 69.5*  PLT 275   Cardiac Enzymes: No results for input(s): CKTOTAL, CKMB, CKMBINDEX, TROPONINI in the last 168 hours.  BNP (last 3 results) No results for input(s): BNP in the last 8760 hours.  ProBNP (last 3 results) No results for input(s): PROBNP in the last 8760 hours.  CBG: No results for input(s): GLUCAP in the last 168 hours.  Radiological Exams on Admission: No results found.  EKG: I independently viewed the EKG done and my findings are as followed: Sinus rhythm rate of 84, nonspecific ST-T changes.  QTc 472.  Assessment/Plan Present on Admission:  GI bleed  Active Problems:   GI bleed  GI bleed, unspecified/microcytic anemia/suspected chronic blood loss anemia Presented with hemoglobin of 3.0 and positive FOBT Minimally symptomatic, no acute distress on exam, suspect chronic blood loss. 2 units PRBC ordered to be transfused by  EDP Maintain hemoglobin greater than 7.0. No abdominal tenderness on exam No bowel movement for a week, no personal history of colonoscopy or endoscopy. No use of NSAIDs Endorses unintentional weight loss 10 to 15 pounds in less than 6 months. Continue to hold off Xarelto in the setting of suspected GI bleed IV Protonix 40 mg twice daily Serial H&H every 6 hours Obtain CT angio abdomen pelvis with and without contrast. N.p.o. after midnight GI [place] consulted Dr. Rayne Du.  Hypokalemia Serum potassium 3.0 Repleted orally Repeat in the morning. Obtain magnesium level  Essential hypertension Hold off home oral antihypertensive in the setting of suspected GI bleed. Maintain MAP greater than 65. Continue to monitor management  GERD IV PPI as stated above  Chronic anxiety/depression Resume home Paxil  History of right lower extremity DVT Hold off Xarelto  Hypothyroidism Resume home levothyroxine.    DVT prophylaxis: No pharmacological DVT prophylaxis, home Xarelto on hold due to likely GI bleed.  Code Status: Full code  Family Communication: Son at bedside  Disposition Plan: Admitted to progressive unit  Consults called: GI Maryanna Shape  Admission status: Inpatient status.  Patient will require at least 2 midnight for further evaluation and treatment of present condition.   Status is: Inpatient    Dispo:  Patient From: [place]  Planned Disposition: [place] when hemoglobin is stable and GI signs off.  Medically stable for discharge: No         Kayleen Memos MD [facility] Pager [phone]  If 7PM-7AM, please contact night-coverage [email or web address] Password [facility]  01/26/2021, 11:15 PM

## 2021-01-26 NOTE — ED Triage Notes (Signed)
Nursing facility concerns pt vomiting drew labs. Results indicated HGB 3.0. Pt denies NV today. Denies bleeding/bloody stools. Takes Xarelto.

## 2021-01-26 NOTE — ED Notes (Signed)
EDP made aware of pt hemoglobin 3.0

## 2021-01-27 ENCOUNTER — Inpatient Hospital Stay (HOSPITAL_COMMUNITY): Payer: Medicare PPO

## 2021-01-27 DIAGNOSIS — Z7901 Long term (current) use of anticoagulants: Secondary | ICD-10-CM

## 2021-01-27 DIAGNOSIS — D649 Anemia, unspecified: Secondary | ICD-10-CM | POA: Diagnosis not present

## 2021-01-27 DIAGNOSIS — K922 Gastrointestinal hemorrhage, unspecified: Secondary | ICD-10-CM | POA: Diagnosis not present

## 2021-01-27 DIAGNOSIS — L8990 Pressure ulcer of unspecified site, unspecified stage: Secondary | ICD-10-CM | POA: Insufficient documentation

## 2021-01-27 DIAGNOSIS — D500 Iron deficiency anemia secondary to blood loss (chronic): Secondary | ICD-10-CM | POA: Diagnosis not present

## 2021-01-27 LAB — BASIC METABOLIC PANEL
Anion gap: 9 (ref 5–15)
BUN: 6 mg/dL — ABNORMAL LOW (ref 8–23)
CO2: 23 mmol/L (ref 22–32)
Calcium: 8.2 mg/dL — ABNORMAL LOW (ref 8.9–10.3)
Chloride: 107 mmol/L (ref 98–111)
Creatinine, Ser: 0.56 mg/dL (ref 0.44–1.00)
GFR, Estimated: >60 mL/min (ref >60)
Glucose, Bld: 96 mg/dL (ref 70–99)
Potassium: 3.7 mmol/L (ref 3.5–5.1)
Sodium: 139 mmol/L (ref 135–145)

## 2021-01-27 LAB — IRON AND TIBC
Iron: 52 ug/dL (ref 28–170)
Saturation Ratios: 11 % (ref 10.4–31.8)
TIBC: 489 ug/dL — ABNORMAL HIGH (ref 250–450)
UIBC: 437 ug/dL

## 2021-01-27 LAB — CBC
HCT: 37.5 % (ref 36.0–46.0)
Hemoglobin: 11.7 g/dL — ABNORMAL LOW (ref 12.0–15.0)
MCH: 26.3 pg (ref 26.0–34.0)
MCHC: 31.2 g/dL (ref 30.0–36.0)
MCV: 84.3 fL (ref 80.0–100.0)
Platelets: 191 10*3/uL (ref 150–400)
RBC: 4.45 MIL/uL (ref 3.87–5.11)
RDW: 19.9 % — ABNORMAL HIGH (ref 11.5–15.5)
WBC: 8 10*3/uL (ref 4.0–10.5)
nRBC: 1.6 % — ABNORMAL HIGH (ref 0.0–0.2)

## 2021-01-27 LAB — PREPARE RBC (CROSSMATCH)

## 2021-01-27 LAB — MAGNESIUM: Magnesium: 1.9 mg/dL (ref 1.7–2.4)

## 2021-01-27 LAB — HIV ANTIBODY (ROUTINE TESTING W REFLEX): HIV Screen 4th Generation wRfx: NONREACTIVE

## 2021-01-27 LAB — SARS CORONAVIRUS 2 (TAT 6-24 HRS): SARS Coronavirus 2: NEGATIVE

## 2021-01-27 LAB — FERRITIN: Ferritin: 8 ng/mL — ABNORMAL LOW (ref 11–307)

## 2021-01-27 MED ORDER — LEVOTHYROXINE SODIUM 100 MCG PO TABS: 100.0000 ug | ORAL_TABLET | Freq: Every day | ORAL | Status: DC

## 2021-01-27 MED ORDER — PAROXETINE HCL 20 MG PO TABS
20.0000 mg | ORAL_TABLET | Freq: Every day | ORAL | Status: DC
  Administered 2021-01-27 – 2021-02-13 (×17): 20 mg via ORAL
  Filled 2021-01-27 (×17): qty 1

## 2021-01-27 MED ORDER — POLYETHYLENE GLYCOL 3350 17 G PO PACK
17.0000 g | PACK | Freq: Every day | ORAL | Status: DC | PRN
  Filled 2021-01-27: qty 1

## 2021-01-27 MED ORDER — ONDANSETRON HCL 4 MG/2ML IJ SOLN: 4.0000 mg | Freq: Four times a day (QID) | INTRAMUSCULAR | Status: DC | PRN

## 2021-01-27 MED ORDER — BACLOFEN 10 MG PO TABS
10.0000 mg | ORAL_TABLET | Freq: Three times a day (TID) | ORAL | Status: DC | PRN
  Administered 2021-01-27 – 2021-02-13 (×15): 10 mg via ORAL
  Filled 2021-01-27 (×19): qty 1

## 2021-01-27 MED ORDER — FLUTICASONE PROPIONATE 50 MCG/ACT NA SUSP: 1.0000 | Freq: Every day | NASAL | Status: DC | PRN

## 2021-01-27 MED ORDER — FUROSEMIDE 10 MG/ML IJ SOLN
20.0000 mg | Freq: Once | INTRAMUSCULAR | Status: AC
  Administered 2021-01-27: 20 mg via INTRAVENOUS
  Filled 2021-01-27: qty 2

## 2021-01-27 MED ORDER — SODIUM CHLORIDE 0.9% IV SOLUTION: Freq: Once | INTRAVENOUS | Status: AC

## 2021-01-27 MED ORDER — METOPROLOL TARTRATE 25 MG PO TABS
25.0000 mg | ORAL_TABLET | Freq: Two times a day (BID) | ORAL | Status: DC
  Administered 2021-01-28 – 2021-02-13 (×33): 25 mg via ORAL
  Filled 2021-01-27 (×18): qty 1
  Filled 2021-01-27: qty 2
  Filled 2021-01-27 (×16): qty 1

## 2021-01-27 MED ORDER — IOHEXOL 350 MG/ML SOLN
100.0000 mL | Freq: Once | INTRAVENOUS | Status: AC | PRN
  Administered 2021-01-27: 100 mL via INTRAVENOUS

## 2021-01-27 MED ORDER — POTASSIUM CHLORIDE 10 MEQ/100ML IV SOLN
10.0000 meq | INTRAVENOUS | Status: AC
Start: 2021-01-27 — End: 2021-01-27
  Administered 2021-01-27 (×4): 10 meq via INTRAVENOUS
  Filled 2021-01-27 (×4): qty 100

## 2021-01-27 MED ORDER — PANTOPRAZOLE SODIUM 40 MG IV SOLR
40.0000 mg | Freq: Two times a day (BID) | INTRAVENOUS | Status: DC
  Administered 2021-01-27 – 2021-02-06 (×21): 40 mg via INTRAVENOUS
  Filled 2021-01-27 (×21): qty 40

## 2021-01-27 MED ORDER — ACETAMINOPHEN 325 MG PO TABS
650.0000 mg | ORAL_TABLET | Freq: Four times a day (QID) | ORAL | Status: DC | PRN
  Administered 2021-02-07: 325 mg via ORAL
  Administered 2021-02-11 – 2021-02-13 (×3): 650 mg via ORAL
  Filled 2021-01-27 (×6): qty 2

## 2021-01-27 MED ORDER — POTASSIUM CHLORIDE CRYS ER 20 MEQ PO TBCR
40.0000 meq | EXTENDED_RELEASE_TABLET | Freq: Once | ORAL | Status: AC
  Administered 2021-01-27: 40 meq via ORAL

## 2021-01-27 MED ORDER — MELATONIN 3 MG PO TABS: 3.0000 mg | ORAL_TABLET | Freq: Every evening | ORAL | Status: DC | PRN

## 2021-01-27 MED ORDER — LEVOTHYROXINE SODIUM 25 MCG PO TABS
125.0000 ug | ORAL_TABLET | Freq: Every day | ORAL | Status: DC
  Administered 2021-01-27 – 2021-02-13 (×16): 125 ug via ORAL
  Filled 2021-01-27 (×18): qty 1

## 2021-01-27 NOTE — Plan of Care (Signed)
POC initiated and progressing. 

## 2021-01-27 NOTE — Consult Note (Addendum)
Referring Provider:  [facility]         Primary Care Physician:  Donald Prose, MD Primary Gastroenterologist: Althia Forts Reason for Consultation:    Profound anemia, Hemoccult positive               Attending physician's note   I have taken a history, examined the patient and reviewed the chart. I agree with the Advanced Practitioner's note, impression and recommendations.  65 year old female with history of thoracic myelopathy, wheelchair dependent, subarachnoid hemorrhage, lower extremity DVT on Xarelto with history of chronic iron deficiency anemia admitted with severe anemia, hemoglobin 3 from skilled nursing facility  CT showed large hiatal hernia which involves most of the stomach and also splenic flexure of the colon. Her presentation is consistent with subacute to chronic GI blood loss, possible etiology Cameron's erosions, gastroduodenitis or peptic ulcer disease  We will plan to proceed with EGD once hemoglobin reaches >7, she is currently receiving PRBC transfusion  Patient is unlikely to tolerate bowel prep, she is bedbound and with a large hernia with most of the stomach in the chest is at risk for aspiration with bowel prep.  We will plan for colonoscopy only if EGD is unremarkable with no obvious source for GI blood loss  Continue to hold Xarelto, last dose was likely yesterday Patient is currently hemodynamically stable Soft diet as tolerated We will tentatively plan for EGD on Monday, August 1 with anesthesia.   N.p.o. after midnight tomorrow Pantoprazole IV twice daily   The patient was provided an opportunity to ask questions and all were answered. The patient agreed with the plan and demonstrated an understanding of the instructions.  Damaris Hippo , MD [phone]    ASSESSMENT / PLAN   # 65 year old female admitted with profound acute on chronic anemia (hemoglobin 3) on Xarelto. She is microcytic.  Stool on ED provider's rectal exam was  remarkable for brown and maroon stool which was heme positive.  Endorses one episode of vomiting a couple of days ago ( clear and brown liquid). Patient has a very large hiatal hernia so rule out Cameron's lesions as source of anemia.  Also consider intestinal AVMs, neoplasm.  -- Last dose of Xarelto unknown but possibly yesterday morning.  Xarelto currently on hold --Patient will need EGD and colonoscopy when anemia improves.  Colonoscopy may be problematic however since her entire hepatic flexure is contained in the large hiatal hernia.  Will ask Dr. Silverio Decamp to review CT scan to make sure colonoscopy is feasible. The risks and benefits of colonoscopy with possible polypectomy / biopsies were discussed and the patient agrees to proceed.  -- Given that patient is wheelchair-bound it may be easier to insert a Flexi-Seal when bowel prep starts --Hold oral iron for now --Iron studies --Continue BID PPI.  --Blood transfusion in progress.   # Large hiatal hernia containing most of stomach and the entire hepatic flexure.  Left lower lobe atelectasis associated with the hernia. Fortunately she has no related pain or vomiting. She does have intermittent dysphagia   # Chronic GERD, overall sounds like symptoms controlled on daily PPI.   # ? Acute proctitis on CTA >> posterior perirectal inflammatory stranding as seen on series 9, image 192 with mild underlying posterior rectal wall thickening. No discrete mass or hyperenhancement  # Thoracic myelopathy /wheelchair-bound.    HISTORY OF PRESENT ILLNESS  Chief Complaint: Anemia  Katrina Donaldson is a 65 y.o. female with a past medical history significant for hypertension, thyroidectomy , history of DVT on Xarelto, thoracic myelopathy / wheelchair dependent, SAH requiring surgery in 2016, large hiatal hernia, anxiety and  depression.  Patient was admitted last night after being transferred from nursing facility to ED for a hemoglobin of 3.  In the ED her WBC was 7.3, hemoglobin 3.0, MCV 69.5, platelets 275.  Renal function was normal, albumin 3.0, liver chemistries otherwise normal.  Patient does not know if she has been having any blood in her stool.  After bowel movement she is cleaned by someone else though no one has ever mentioned the presence of blood in her stool.  She had an episode of vomiting a couple of days ago.  She recalls emesis being a combination of clear and dark brown liquid.  She has lost about 10 pounds unexpectedly in the last few months.  She has no abdominal pain.  No known history of PUD.  Patient says she takes Tylenol as needed for pain.  To her knowledge she does not take any NSAIDs.  She is on Xarelto.  She is also on oral iron.  Patient gives a longstanding history of reflux but symptoms controlled with daily Prilosec  Patient endorses occasional dysphagia but cannot say for how long this has been going on and she has always attributed it to her thyroid being removed.  No known family history of stomach or colon cancer.  Patient says she has never had a colonoscopy.  SIGNIFICANT DIAGNOTIC STUDIES    01/27/21 CTA for GI bleed IMPRESSION: 1. Negative abdominal aorta.   2. There is presacral inflammation with mild focal wall thickening of the posterior rectum suggesting Acute Proctitis. But no source of active GI bleeding is identified by CTA. Redundant large bowel but minimal diverticula. Large hiatal hernia containing most of the stomach and the entire splenic flexure, but no associated bowel obstruction or other complicating features at this time. Nonspecific fibrofatty proliferation of the cecum.   3.  Calcified uterine fibroid. Left nephrolithiasis. ChronicPostoperative changes to the lower thoracic spine posterior elements. Spondylolisthesis with obliterated disc space and  ankylosis at L4-L5. 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     PREVIOUS ENDOSCOPIC EVALUATIONS   None   Past Medical History:  Diagnosis Date   Arachnoid cyst    Brain aneurysm    Chronic anticoagulation 05/26/2017   Depression    History of DVT (deep vein thrombosis)    Hypercoagulable state ([place])    Hypertension    Hypothyroidism    Stroke [facility]) 2016   [facility] 11/29/14   Wheelchair bound     Past Surgical History:  Procedure Laterality Date   ABDOMINAL HYSTERECTOMY     patient denies   BRAIN SURGERY     aneurysm   goiter     LAMINECTOMY N/A 07/19/2016   Procedure: LAMINECTOMY THORACIC FOUR THORACIC FIVE, THORACIC SIX TO THORACIC EIGHT,THORACIC TEN TO LUMBAR ONE, FENESTRATION OF ARACHNOID CYSTS;  Surgeon: Consuella Lose, MD;  Location: [facility];  Service: Neurosurgery;  Laterality: N/A;   LAMINECTOMY N/A 06/03/2017   Procedure: THORACIC THREE - THORACIC FIVE LAMINECTOMY, FENESTRATION OF ARACHNOID CYST;  Surgeon: Consuella Lose, MD;  Location: [place];  Service: Neurosurgery;  Laterality: N/A;   THYROIDECTOMY     TUBAL LIGATION      Prior to Admission medications   Medication  Sig Start Date End Date Taking? Authorizing Provider  acetaminophen (TYLENOL) 500 MG tablet Take 500-1,000 mg by mouth every 6 (six) hours as needed for moderate pain or headache.    [provider]  amLODipine (NORVASC) 10 MG tablet Take 1 tablet (10 mg total) by mouth daily. 08/23/16   Angiulli, Lavon Paganini, PA-C  baclofen (LIORESAL) 10 MG tablet Take 10 mg by mouth 3 (three) times daily as needed for muscle spasms.  05/09/17   [provider]  ferrous sulfate 325 (65 FE) MG tablet Take 1 tablet (325 mg total) by mouth daily with breakfast. Patient not taking: Reported on 05/29/2017 08/24/16   Angiulli, Lavon Paganini, PA-C  fluticasone (FLONASE) 50 MCG/ACT nasal spray Place 1 spray  into both nostrils daily as needed for allergies.     [provider]  levothyroxine (SYNTHROID, LEVOTHROID) 100 MCG tablet Take 100 mcg by mouth daily before breakfast.    [provider]  lidocaine (LIDODERM) 5 % Place 1 patch onto the skin daily. Remove & Discard patch within 12 hours or as directed by MD Patient not taking: Reported on 05/29/2017 08/24/16   Angiulli, Lavon Paganini, PA-C  methocarbamol (ROBAXIN) 500 MG tablet Take 1 tablet (500 mg total) by mouth every 6 (six) hours as needed for muscle spasms. Patient not taking: Reported on 05/29/2017 08/23/16   Angiulli, Lavon Paganini, PA-C  metoprolol (LOPRESSOR) 100 MG tablet Take 1 tablet (100 mg total) by mouth 2 (two) times daily. 08/23/16   Angiulli, Lavon Paganini, PA-C  Multiple Vitamin (MULTIVITAMIN WITH MINERALS) TABS tablet Take 1 tablet by mouth daily. 08/24/16   Angiulli, Lavon Paganini, PA-C  omeprazole (PRILOSEC) 20 MG capsule Take 1 capsule (20 mg total) by mouth daily. Patient not taking: Reported on 05/29/2017 08/23/16   Angiulli, Lavon Paganini, PA-C  oxyCODONE 10 MG TABS Take 1 tablet (10 mg total) by mouth every 6 (six) hours as needed for severe pain. 06/05/17   Consuella Lose, MD  PARoxetine (PAXIL) 20 MG tablet Take 1 tablet (20 mg total) by mouth daily. 08/23/16   Angiulli, Lavon Paganini, PA-C  rivaroxaban (XARELTO) 20 MG TABS tablet Take 1 tablet (20 mg total) by mouth daily with supper. 08/23/16   Angiulli, Lavon Paganini, PA-C  vitamin C (VITAMIN C) 250 MG tablet Take 1 tablet (250 mg total) by mouth daily with breakfast. Patient not taking: Reported on 05/29/2017 08/24/16   Angiulli, Lavon Paganini, PA-C    Current Facility-Administered Medications  Medication Dose Route Frequency Provider Last Rate Last Admin   acetaminophen (TYLENOL) tablet 650 mg  650 mg Oral Q6H PRN [place] Crane, Carole N, DO       fluticasone (FLONASE) 50 MCG/ACT nasal spray 1 spray  1 spray Each Nare Daily PRN Kayleen Memos, DO       levothyroxine (SYNTHROID) tablet 125 mcg   125 mcg Oral QAC breakfast Kayleen Memos, DO   125 mcg at 01/27/21 0915   melatonin tablet 3 mg  3 mg Oral QHS PRN Irene Pap N, DO       ondansetron [facility]) injection 4 mg  4 mg Intravenous Q6H PRN Irene Pap N, DO       pantoprazole (PROTONIX) injection 40 mg  40 mg Intravenous Q12H Hall, Archie Patten N, DO   40 mg at 01/27/21 0916   PARoxetine (PAXIL) tablet 20 mg  20 mg Oral Daily Irene Pap N, DO   20 mg at 01/27/21 0915   polyethylene glycol (MIRALAX / GLYCOLAX) packet  17 g  17 g Oral Daily PRN Irene Pap N, DO       potassium chloride 10 mEq in 100 mL IVPB  10 mEq Intravenous Q1 Hr x 4 Lavina Hamman, MD        Allergies as of 01/26/2021 - Review Complete 01/26/2021  Allergen Reaction Noted   Other Other (See Comments) 08/16/2016   Family medical history.  No known colon or gastric cancer   Social History   Socioeconomic History   Marital status: Widowed    Spouse name: Not on file   Number of children: Not on file   Years of education: Not on file   Highest education level: Not on file  Occupational History   Not on file  Tobacco Use   Smoking status: Never   Smokeless tobacco: Never  Vaping Use   Vaping Use: Never used  Substance and Sexual Activity   Alcohol use: No    Alcohol/week: 0.0 standard drinks   Drug use: No   Sexual activity: Not Currently  Other Topics Concern   Not on file  Social History Narrative   Not on file   Social Determinants of Health   Financial Resource Strain: Not on file  Food Insecurity: Not on file  Transportation Needs: Not on file  Physical Activity: Not on file  Stress: Not on file  Social Connections: Not on file  Intimate Partner Violence: Not on file    Review of Systems: Positive for fatigue.  All other systems reviewed and negative except where noted in HPI.  OBJECTIVE      Physical Exam: Vital signs in last 24 hours: Temp:  [98.2 F (36.8 C)-99.1 F (37.3 C)] 98.3 F (36.8 C) (07/30 0621) Pulse Rate:   [71-89] 80 (07/30 0621) Resp:  [16-19] 18 (07/30 0326) BP: (100-133)/(61-106) 128/106 (07/30 0621) SpO2:  [92 %-100 %] 98 % (07/30 0621) Weight:  [89.1 kg] 89.1 kg (07/30 0129) Last BM Date: 01/26/21 General:   Alert  female in NAD Psych:  Pleasant, cooperative.  Eyes:  Pupils equal, sclera clear, no icterus.   Ears:  Normal auditory acuity. Nose:  No deformity, discharge,  or lesions. Neck:  Supple; no masses Lungs:  Clear throughout to auscultation.   No wheezes.  Heart:  Regular rate and rhythm;  no lower extremity edema Abdomen:  Soft, non-distended, nontender, BS active, no palp mass   Rectal:  Deferred  Msk:  Symmetrical without gross deformities. . Neurologic:  Alert and  oriented x4;  grossly normal neurologically. Skin:  Intact without significant lesions or rashes.  Filed Weights   01/27/21 0129  Weight: 89.1 kg     Scheduled inpatient medications  levothyroxine  125 mcg Oral QAC breakfast   pantoprazole (PROTONIX) IV  40 mg Intravenous Q12H   PARoxetine  20 mg Oral Daily      Intake/Output from previous day: 07/29 0701 - 07/30 0700 In: 856 [Blood:856] Out: -  Intake/Output this shift: No intake/output data recorded.   Lab Results: Recent Labs    01/26/21 2045  WBC 7.3  HGB 3.0*  HCT 11.6*  PLT 275   BMET Recent Labs    01/26/21 2045  NA 139  K 3.0*  CL 107  CO2 25  GLUCOSE 105*  BUN 7*  CREATININE 0.67  CALCIUM 8.3*   LFT Recent Labs    01/26/21 2045  PROT 7.3  ALBUMIN 3.0*  AST 13*  ALT 9  ALKPHOS 77  BILITOT 0.3  PT/INR No results for input(s): LABPROT, INR in the last 72 hours. Hepatitis Panel No results for input(s): HEPBSAG, HCVAB, HEPAIGM, HEPBIGM in the last 72 hours.   . CBC Latest Ref Rng & Units 01/26/2021 06/04/2017 06/03/2017  WBC 4.0 - 10.5 K/uL 7.3 11.5(H) 7.7  Hemoglobin 12.0 - 15.0 g/dL 3.0(LL) 11.3(L) 14.2  Hematocrit 36.0 - 46.0 % 11.6(L) 35.2(L) 42.7  Platelets 150 - 400 K/uL 275 361 435(H)    . CMP  Latest Ref Rng & Units 01/26/2021 06/04/2017 06/03/2017  Glucose 70 - 99 mg/dL 105(H) 132(H) 99  BUN 8 - 23 mg/dL 7(L) 10 10  Creatinine 0.44 - 1.00 mg/dL 0.67 0.98 0.99  Sodium 135 - 145 mmol/L 139 139 140  Potassium 3.5 - 5.1 mmol/L 3.0(L) 4.7 3.9  Chloride 98 - 111 mmol/L 107 107 104  CO2 22 - 32 mmol/L '25 22 25  '$ Calcium 8.9 - 10.3 mg/dL 8.3(L) 8.4(L) 9.3  Total Protein 6.5 - 8.1 g/dL 7.3 - -  Total Bilirubin 0.3 - 1.2 mg/dL 0.3 - -  Alkaline Phos 38 - 126 U/L 77 - -  AST 15 - 41 U/L 13(L) - -  ALT 0 - 44 U/L 9 - -   Studies/Results: CT ANGIO GI BLEED  Result Date: 01/27/2021 CLINICAL DATA:  65 year old female with profound anemia and maroon stool with positive fecal occult blood test. EXAM: CTA ABDOMEN AND PELVIS WITHOUT AND WITH CONTRAST TECHNIQUE: Multidetector CT imaging of the abdomen and pelvis was performed using the standard protocol during bolus administration of intravenous contrast. Multiplanar reconstructed images and MIPs were obtained and reviewed to evaluate the vascular anatomy. CONTRAST:  155m OMNIPAQUE IOHEXOL 350 MG/ML SOLN COMPARISON:  No prior CT Abdomen and Pelvis portable abdomen radiograph 07/21/2016. FINDINGS: VASCULAR Mildly tortuous descending thoracic aorta. Normal abdominal aorta. Mild to moderate bilateral iliac artery atherosclerosis. Major arterial structures are patent. No abdominal dissection or aneurysm. Central venous structures appear to be patent on the delayed images. Review of the MIP images confirms the above findings. NON-VASCULAR Lower chest: Large hiatal hernia containing most of the stomach and the entire splenic flexure. Cardiac size remains within normal limits. Tortuous descending thoracic aorta. No pericardial or pleural effusion. Confluent enhancing left lower lobe atelectasis associated with the hernia. Negative right lung base. Hepatobiliary: Negative liver and gallbladder. No bile duct enlargement. Portal venous system appears to be patent on  the delayed images. Pancreas: Negative. Spleen: Negative. Adrenals/Urinary Tract: Mild left lower pole nephrolithiasis. Otherwise negative. Normal adrenal glands. Mildly distended urinary bladder. Stomach/Bowel: There is posterior perirectal inflammatory stranding as seen on series 9, image 192 with mild underlying posterior rectal wall thickening. No discrete mass or hyperenhancement. The upstream rectum has a more normal appearance. Redundant sigmoid colon tracks into the epigastrium, but with minimal diverticulosis and no active inflammation. Negative descending colon. Splenic flexure contained within a large left diaphragmatic hernia, within normal limits. Negative hepatic flexure. There is mild nonspecific fibrofatty proliferation of the cecum, but no active inflammation. Normal appendix on coronal series 12, image 44. Negative terminal ileum. No dilated small bowel. Mostly intrathoracic stomach related to hernia. Duodenum appears negative. No free air or free fluid. No gastrointestinal contrast extravasation identified. Lymphatic: No lymphadenopathy. Reproductive: Coarsely calcified exophytic and subserosal 5.3 cm left uterine fibroid. Otherwise negative. Other: Confluent but nonspecific presacral stranding. No pelvic free fluid. Musculoskeletal: Widespread prior lower thoracic posterior laminectomy, decompression. Chronic lower lumbar disc space loss, obliteration with grade 1 anterolisthesis and interbody and posterior element ankylosis.  Left side lumbosacral ankylosis. 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 Associated pseudoarthrosis with the left anterior iliac wing. No acute osseous abnormality identified. IMPRESSION: 1. Negative abdominal aorta. 2. There is presacral inflammation with mild focal wall thickening of the posterior  rectum suggesting Acute Proctitis. But no source of active GI bleeding is identified by CTA. Redundant large bowel but minimal diverticula. Large hiatal hernia containing most of the stomach and the entire splenic flexure, but no associated bowel obstruction or other complicating features at this time. Nonspecific fibrofatty proliferation of the cecum. 3.  Calcified uterine fibroid. Left nephrolithiasis. ChronicPostoperative changes to the lower thoracic spine posterior elements. Spondylolisthesis with obliterated disc space and ankylosis at L4-L5. 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 Electronically Signed   By: Genevie Ann M.D.   On: 01/27/2021 08:09    Active Problems:   GI bleed   Pressure injury of skin    Tye Savoy, NP-C @  01/27/2021, 9:28 AM

## 2021-01-27 NOTE — Progress Notes (Signed)
[facility] Progress Note  Patient: Katrina Donaldson    [id number]  DOA: 01/26/2021     Date of Service: the patient was seen and examined on 01/27/2021  Brief hospital course: Past medical history of SAH, thoracic myelopathy, wheelchair dependent, bedbound, history of DVT on Xarelto, HTN, IDA, hypothyroidism, anxiety, depression.  Presents with complaints of abnormal lab with hemoglobin of 3.0. Suspect upper GI bleed.  GI consulted. Currently plan is continue transfuse and monitor.  Subjective: No acute complaint.  No nausea no vomiting but no fever no chills.  No headache.  Continues to have fatigue and tiredness.  Reports muscle spasm  Assessment and Plan: 1.  GI bleed Symptomatic anemia Suspect acute on chronic blood loss anemia. Hemoglobin 3.0 on admission.  Hemoccult positive. 4 PRBC ordered. Monitor H&H and maintain hemoglobin more than 7. Hold Xarelto. Continue PPI twice daily. CT angio abdomen negative for any acute bleeding episode. GI recommends EGD.  Patient is too high risk for colonoscopy.  2. Hiatal hernia. Placing the patient at high risk for colonoscopy with aspiration. EGD likely Monday.  Will monitor.  3.  Hypokalemia Currently being replaced. Will monitor.  4.  History of anxiety and depression Currently stable.  Continue Paxil.  5.  HTN Blood pressure soft. Home blood pressure medications on hold.  Will monitor and initiate Lopressor at lower dose of 25 mg twice a day  6.  History of DVT In 2018. Patient appears to be high risk for recurrent DVT and therefore lifelong anticoagulation. Monitor for now.  7.  Hypothyroidism. Continue Synthroid.  8.  Obesity Placing the patient at high risk for poor outcome.  9.  Medial coccyx stage II pressure ulcer. Present on admission. Continue home dressing.  Scheduled Meds:  levothyroxine  125 mcg Oral QAC breakfast   [START ON 01/28/2021] metoprolol tartrate  25 mg Oral BID   pantoprazole  (PROTONIX) IV  40 mg Intravenous Q12H   PARoxetine  20 mg Oral Daily   Continuous Infusions: PRN Meds: acetaminophen, baclofen, fluticasone, melatonin, ondansetron (ZOFRAN) IV, polyethylene glycol  Body mass index is 32.69 kg/m.    Pressure Injury 01/27/21 Coccyx Medial Stage 2 -  Partial thickness loss of dermis presenting as a shallow open injury with a red, pink wound bed without slough. (Active)  01/27/21 0311  Location: Coccyx  Location Orientation: Medial  Staging: Stage 2 -  Partial thickness loss of dermis presenting as a shallow open injury with a red, pink wound bed without slough.  Wound Description (Comments):   Present on Admission:      DVT Prophylaxis: scd      Advance goals of care discussion: Pt is Full code.  Family Communication: no family was present at bedside, at the time of interview.   Data Reviewed: I have personally reviewed and interpreted daily labs, tele strips, imaging. Repeat hemoglobin pending.  CT angiogram shows no evidence of acute bleed.  Large hiatal hernia as well as hepatic flexure in hernia sac.  Physical Exam:  General: Appear in mild distress, no Rash; Oral Mucosa Clear, moist. no Abnormal Neck Mass Or lumps, Conjunctiva normal  Cardiovascular: S1 and S2 Present, no Murmur, Respiratory: good respiratory effort, Bilateral Air entry present and CTA, no Crackles, no wheezes Abdomen: Bowel Sound present, Soft and no tenderness Extremities: no Pedal edema Neurology: alert and oriented to time, place, and person affect appropriate. no new focal deficit Gait not checked due to patient safety concerns  Vitals:   01/27/21 1335 01/27/21 1410  01/27/21 1542 01/27/21 1648  BP: 131/81 127/76 (!) 143/83 (!) 142/85  Pulse: 85 87 80 82  Resp:  19  17  Temp: 98 F (36.7 C) 98.1 F (36.7 C) 98 F (36.7 C) (!) 97.5 F (36.4 C)  TempSrc: Oral Oral Oral Oral  SpO2: 95% 100% 98% 98%  Weight:        Disposition:  Status is:  Inpatient  Remains inpatient appropriate because:Ongoing diagnostic testing needed not appropriate for outpatient work up and IV treatments appropriate due to intensity of illness or inability to take PO  Dispo:  Patient From: [facility]  Planned Disposition: [facility]  Medically stable for discharge: No          Time spent: 35 minutes. I reviewed all nursing notes, pharmacy notes, vitals, pertinent old records. I have discussed plan of care as described above with RN.  Author: Berle Mull, MD [facility] 01/27/2021 8:16 PM  To reach On-call, see care teams to locate the attending and reach out via [email or web address]. Between 7PM-7AM, please contact night-coverage If you still have difficulty reaching the attending provider, please page the [facility] (Director on Call) for [facility] on amion for assistance.

## 2021-01-27 NOTE — Progress Notes (Signed)
Pt received two units of blood, CT notified. The plan is to go to CT first before starting the two other units.

## 2021-01-27 NOTE — Progress Notes (Signed)
Pt arrived to the floor via the stretcher alert and oriented and unable to ambulate to the room. With a unit of blood running, Pt transferred to bed, VSS, cardiac monitoring initiated. CHG bathe given, we'll continue to monitor.

## 2021-01-28 DIAGNOSIS — K922 Gastrointestinal hemorrhage, unspecified: Secondary | ICD-10-CM | POA: Diagnosis not present

## 2021-01-28 LAB — CBC
HCT: 33 % — ABNORMAL LOW (ref 36.0–46.0)
Hemoglobin: 11.1 g/dL — ABNORMAL LOW (ref 12.0–15.0)
MCH: 27.1 pg (ref 26.0–34.0)
MCHC: 33.6 g/dL (ref 30.0–36.0)
MCV: 80.5 fL (ref 80.0–100.0)
Platelets: 170 10*3/uL (ref 150–400)
RBC: 4.1 MIL/uL (ref 3.87–5.11)
RDW: 20 % — ABNORMAL HIGH (ref 11.5–15.5)
WBC: 8.6 10*3/uL (ref 4.0–10.5)
nRBC: 2 % — ABNORMAL HIGH (ref 0.0–0.2)

## 2021-01-28 LAB — BPAM RBC
Blood Product Expiration Date: 202208232359
Blood Product Expiration Date: 202208232359
Blood Product Expiration Date: 202208272359
Blood Product Expiration Date: 202209012359
ISSUE DATE / TIME: 202207292232
ISSUE DATE / TIME: 202207300240
ISSUE DATE / TIME: 202207300932
ISSUE DATE / TIME: 202207301347
Unit Type and Rh: 6200
Unit Type and Rh: 6200
Unit Type and Rh: 8400
Unit Type and Rh: 8400

## 2021-01-28 LAB — TYPE AND SCREEN
ABO/RH(D): AB POS
Antibody Screen: NEGATIVE
Unit division: 0
Unit division: 0
Unit division: 0
Unit division: 0

## 2021-01-28 LAB — BASIC METABOLIC PANEL
Anion gap: 9 (ref 5–15)
BUN: <5 mg/dL — ABNORMAL LOW (ref 8–23)
CO2: 25 mmol/L (ref 22–32)
Calcium: 8.1 mg/dL — ABNORMAL LOW (ref 8.9–10.3)
Chloride: 106 mmol/L (ref 98–111)
Creatinine, Ser: 0.56 mg/dL (ref 0.44–1.00)
GFR, Estimated: >60 mL/min (ref >60)
Glucose, Bld: 97 mg/dL (ref 70–99)
Potassium: 3.6 mmol/L (ref 3.5–5.1)
Sodium: 140 mmol/L (ref 135–145)

## 2021-01-28 LAB — HEMOGLOBIN AND HEMATOCRIT, BLOOD
HCT: 34.8 % — ABNORMAL LOW (ref 36.0–46.0)
HCT: 36.6 % (ref 36.0–46.0)
Hemoglobin: 11.7 g/dL — ABNORMAL LOW (ref 12.0–15.0)
Hemoglobin: 11.6 g/dL — ABNORMAL LOW (ref 12.0–15.0)

## 2021-01-28 MED ORDER — HYDROCORTISONE 1 % EX CREA
TOPICAL_CREAM | Freq: Three times a day (TID) | CUTANEOUS | Status: DC
  Administered 2021-01-28 (×2): 1 via TOPICAL
  Filled 2021-01-28 (×3): qty 28

## 2021-01-28 NOTE — Progress Notes (Signed)
[facility] Progress Note  Patient: Katrina Donaldson    [id number]  DOA: 01/26/2021     Date of Service: the patient was seen and examined on 01/28/2021  Brief hospital course: Past medical history of SAH, thoracic myelopathy, wheelchair dependent, bedbound, history of DVT on Xarelto, HTN, IDA, hypothyroidism, anxiety, depression.  Presents with complaints of abnormal lab with hemoglobin of 3.0. Suspect upper GI bleed.  GI consulted. Currently plan is further work-up for bleeding.  Subjective: No abdominal pain no nausea no vomiting.  No fever no chills.  No bleeding.  Had a rash after sponge bath.  Assessment and Plan: 1.  GI bleed Symptomatic anemia Suspect acute on chronic blood loss anemia. Hemoglobin 3.0 on admission.  Hemoccult positive. 4 PRBC ordered. Monitor H&H and maintain hemoglobin more than 7. Hold Xarelto. Continue PPI twice daily. CT angio abdomen negative for any acute bleeding episode. GI recommends EGD.  Patient is too high risk for colonoscopy.   2. Hiatal hernia. Placing the patient at high risk for colonoscopy with aspiration. EGD likely Monday.  Will monitor.   3.  Hypokalemia Currently being replaced. Will monitor.   4.  History of anxiety and depression Currently stable.  Continue Paxil.   5.  HTN Blood pressure soft. Home blood pressure medications on hold.  Will monitor and initiate Lopressor at lower dose of 25 mg twice a day   6.  History of DVT In 2018. Patient appears to be high risk for recurrent DVT and therefore lifelong anticoagulation. Monitor for now.   7.  Hypothyroidism. Continue Synthroid.   8.  Obesity Placing the patient at high risk for poor outcome.   9.  Medial coccyx stage II pressure ulcer. Present on admission. Continue home dressing.  10.  Maculopapular rash. Patient has a diffuse maculopapular rash on torso and upper extremities. Etiology most likely BATH SOAP. Will follow monitor.   Scheduled  Meds:  hydrocortisone cream   Topical TID   levothyroxine  125 mcg Oral QAC breakfast   metoprolol tartrate  25 mg Oral BID   pantoprazole (PROTONIX) IV  40 mg Intravenous Q12H   PARoxetine  20 mg Oral Daily   Continuous Infusions: PRN Meds: acetaminophen, baclofen, fluticasone, melatonin, ondansetron (ZOFRAN) IV, polyethylene glycol  Body mass index is 32.69 kg/m.    Pressure Injury 01/27/21 Coccyx Medial Stage 2 -  Partial thickness loss of dermis presenting as a shallow open injury with a red, pink wound bed without slough. (Active)  01/27/21 0311  Location: Coccyx  Location Orientation: Medial  Staging: Stage 2 -  Partial thickness loss of dermis presenting as a shallow open injury with a red, pink wound bed without slough.  Wound Description (Comments):   Present on Admission:      DVT Prophylaxis: scd      Advance goals of care discussion: Pt is Full code.  Family Communication: no family was present at bedside, at the time of interview.   Data Reviewed: I have personally reviewed and interpreted daily labs, tele strips, imaging. Hemoglobin stable.  BMP stable.  Physical Exam:  General: Appear in mild distress, no Rash; Oral Mucosa Clear, moist. no Abnormal Neck Mass Or lumps, Conjunctiva normal  Cardiovascular: S1 and S2 Present, no Murmur, Respiratory: good respiratory effort, Bilateral Air entry present and CTA, no Crackles, no wheezes Abdomen: Bowel Sound present, Soft and no tenderness Extremities: no Pedal edema Neurology: alert and oriented to time, place, and person affect appropriate. no new focal deficit Gait not  checked due to patient safety concerns   Vitals:   01/28/21 0738 01/28/21 1235 01/28/21 1721 01/28/21 1912  BP: 135/87 (!) 144/79 138/73 125/79  Pulse: 87 82  78  Resp:  '19 17 16  '$ Temp: 98.5 F (36.9 C) 98.8 F (37.1 C) 98.8 F (37.1 C) 98.7 F (37.1 C)  TempSrc: Oral Oral Oral Oral  SpO2: 98% 100%  97%  Weight:        Disposition:   Status is: Inpatient  Remains inpatient appropriate because:Ongoing diagnostic testing needed not appropriate for outpatient work up  Dispo:  Patient From: [place]  Planned Disposition: [place]  Medically stable for discharge: No          Time spent: 35 minutes. I reviewed all nursing notes, pharmacy notes, vitals, pertinent old records. I have discussed plan of care as described above with RN.  Author: Berle Mull, MD [facility] 01/28/2021 8:35 PM  To reach On-call, see care teams to locate the attending and reach out via [email or web address]. Between 7PM-7AM, please contact night-coverage If you still have difficulty reaching the attending provider, please page the [facility] (Director on Call) for [facility] on amion for assistance.

## 2021-01-28 NOTE — Progress Notes (Signed)
Pt noted to have new red, raised rash on bilateral arms, chest and abdomen.  Not present in am. MD aware and to order cortisone cream.  Will continue to monitor.

## 2021-01-28 NOTE — Anesthesia Preprocedure Evaluation (Addendum)
Anesthesia Evaluation  Patient identified by MRN, date of birth, ID band Patient awake    Reviewed: Allergy & Precautions, H&P , NPO status , Patient's Chart, lab work & pertinent test results  Airway Mallampati: III  TM Distance: >3 FB Neck ROM: Full    Dental no notable dental hx. (+) Teeth Intact, Dental Advisory Given   Pulmonary neg pulmonary ROS,    Pulmonary exam normal breath sounds clear to auscultation       Cardiovascular Exercise Tolerance: Good hypertension, Pt. on medications and Pt. on home beta blockers  Rhythm:Regular Rate:Normal     Neuro/Psych Depression CVA, No Residual Symptoms    GI/Hepatic negative GI ROS, Neg liver ROS,   Endo/Other  Hypothyroidism   Renal/GU negative Renal ROS  negative genitourinary   Musculoskeletal   Abdominal   Peds  Hematology  (+) Blood dyscrasia, anemia ,   Anesthesia Other Findings   Reproductive/Obstetrics negative OB ROS                            Anesthesia Physical Anesthesia Plan  ASA: 3  Anesthesia Plan: MAC   Post-op Pain Management:    Induction: Intravenous  PONV Risk Score and Plan: 2 and Propofol infusion and Treatment may vary due to age or medical condition  Airway Management Planned: Nasal Cannula  Additional Equipment:   Intra-op Plan:   Post-operative Plan:   Informed Consent: I have reviewed the patients History and Physical, chart, labs and discussed the procedure including the risks, benefits and alternatives for the proposed anesthesia with the patient or authorized representative who has indicated his/her understanding and acceptance.     Dental advisory given  Plan Discussed with: CRNA  Anesthesia Plan Comments:        Anesthesia Quick Evaluation

## 2021-01-28 NOTE — Progress Notes (Addendum)
Progress Note  Chief Complaint:    profound anemia, Heme + stool     ASSESSMENT AND PLAN   # 65 year old female admitted with profound IDA (acute on chronic anemia on Xarelto.   Stool on ED provider's rectal exam was remarkable for brown and maroon stool.  Patient has a very large hiatal hernia so rule out Cameron's lesions as source of anemia.  Also consider intestinal AVMs, neoplasm. --Hemoglobin improved from 3 >>> 11 post 4 units of PRBCs --Scheduled for EGD tomorrow. The risks and benefits of EGD with possible biopsies were discussed with the patient who agrees to proceed.  --No pursuing colonoscopy since her entire hepatic flexure is contained in the large hiatal hernia.   --Continue BID PPI.  # Large hiatal hernia containing most of stomach and the entire hepatic flexure.  Left lower lobe atelectasis associated with the hernia. Fortunately she has no related pain or vomiting. She does have intermittent dysphagia   # Chronic GERD, overall sounds like symptoms controlled on daily PPI.   # ? Acute proctitis on CTA >> posterior perirectal inflammatory stranding as seen on series 9, image 192 with mild underlying posterior rectal wall thickening. No discrete mass or hyperenhancement  # Bilateral upper extremity / chest rash, new. Cortisone being applied. Etiology not yet determined. Never had this type of rash before   # Thoracic myelopathy /wheelchair-bound.       SUBJECTIVE   Feels okay. Says she hasn't had a BM today so no bleeding.      OBJECTIVE      Scheduled inpatient medications:   hydrocortisone cream   Topical TID   levothyroxine  125 mcg Oral QAC breakfast   metoprolol tartrate  25 mg Oral BID   pantoprazole (PROTONIX) IV  40 mg Intravenous Q12H   PARoxetine  20 mg Oral Daily   Continuous inpatient infusions:  PRN inpatient medications: acetaminophen, baclofen, fluticasone, melatonin, ondansetron (ZOFRAN) IV, polyethylene glycol  Vital signs in last 24  hours: Temp:  [97.5 F (36.4 C)-98.8 F (37.1 C)] 98.8 F (37.1 C) (07/31 1235) Pulse Rate:  [63-87] 82 (07/31 1235) Resp:  [16-19] 19 (07/31 1235) BP: (126-144)/(72-87) 144/79 (07/31 1235) SpO2:  [95 %-100 %] 100 % (07/31 1235) Last BM Date: 01/26/21  Intake/Output Summary (Last 24 hours) at 01/28/2021 1600 Last data filed at 01/27/2021 1800 Gross per 24 hour  Intake 642.28 ml  Output --  Net 642.28 ml     Physical Exam:  General: Alert female in NAD Heart:  Regular rate, 1+ BLE edema.  Pulmonary: Normal respiratory effort Abdomen: Soft, nondistended, nontender. Normal bowel sounds.  Neurologic: Alert and oriented Psych: Pleasant. Cooperative.   Filed Weights   01/27/21 0129  Weight: 89.1 kg    Intake/Output from previous day: 07/30 0701 - 07/31 0700 In: 1090.5 [P.O.:100; Blood:694.2; IV Piggyback:296.3] Out: 900 [Urine:900] Intake/Output this shift: No intake/output data recorded.    Lab Results: Recent Labs    01/26/21 2045 01/27/21 1812 01/28/21 0058 01/28/21 0445  WBC 7.3 8.0  --  8.6  HGB 3.0* 11.6*  11.7* 11.7* 11.1*  HCT 11.6* 36.6  37.5 34.8* 33.0*  PLT 275 191  --  170   BMET Recent Labs    01/26/21 2045 01/27/21 1812 01/28/21 0445  NA 139 139 140  K 3.0* 3.7 3.6  CL 107 107 106  CO2 '25 23 25  '$ GLUCOSE 105* 96 97  BUN 7* 6* <5*  CREATININE 0.67 0.56 0.56  CALCIUM 8.3* 8.2* 8.1*   LFT Recent Labs    01/26/21 2045  PROT 7.3  ALBUMIN 3.0*  AST 13*  ALT 9  ALKPHOS 77  BILITOT 0.3   PT/INR No results for input(s): LABPROT, INR in the last 72 hours. Hepatitis Panel No results for input(s): HEPBSAG, HCVAB, HEPAIGM, HEPBIGM in the last 72 hours.  CT ANGIO GI BLEED  Result Date: 01/27/2021 CLINICAL DATA:  65 year old female with profound anemia and maroon stool with positive fecal occult blood test. EXAM: CTA ABDOMEN AND PELVIS WITHOUT AND WITH CONTRAST TECHNIQUE: Multidetector CT imaging of the abdomen and pelvis was performed  using the standard protocol during bolus administration of intravenous contrast. Multiplanar reconstructed images and MIPs were obtained and reviewed to evaluate the vascular anatomy. CONTRAST:  139m OMNIPAQUE IOHEXOL 350 MG/ML SOLN COMPARISON:  No prior CT Abdomen and Pelvis portable abdomen radiograph 07/21/2016. FINDINGS: VASCULAR Mildly tortuous descending thoracic aorta. Normal abdominal aorta. Mild to moderate bilateral iliac artery atherosclerosis. Major arterial structures are patent. No abdominal dissection or aneurysm. Central venous structures appear to be patent on the delayed images. Review of the MIP images confirms the above findings. NON-VASCULAR Lower chest: Large hiatal hernia containing most of the stomach and the entire splenic flexure. Cardiac size remains within normal limits. Tortuous descending thoracic aorta. No pericardial or pleural effusion. Confluent enhancing left lower lobe atelectasis associated with the hernia. Negative right lung base. Hepatobiliary: Negative liver and gallbladder. No bile duct enlargement. Portal venous system appears to be patent on the delayed images. Pancreas: Negative. Spleen: Negative. Adrenals/Urinary Tract: Mild left lower pole nephrolithiasis. Otherwise negative. Normal adrenal glands. Mildly distended urinary bladder. Stomach/Bowel: There is posterior perirectal inflammatory stranding as seen on series 9, image 192 with mild underlying posterior rectal wall thickening. No discrete mass or hyperenhancement. The upstream rectum has a more normal appearance. Redundant sigmoid colon tracks into the epigastrium, but with minimal diverticulosis and no active inflammation. Negative descending colon. Splenic flexure contained within a large left diaphragmatic hernia, within normal limits. Negative hepatic flexure. There is mild nonspecific fibrofatty proliferation of the cecum, but no active inflammation. Normal appendix on coronal series 12, image 44. Negative  terminal ileum. No dilated small bowel. Mostly intrathoracic stomach related to hernia. Duodenum appears negative. No free air or free fluid. No gastrointestinal contrast extravasation identified. Lymphatic: No lymphadenopathy. Reproductive: Coarsely calcified exophytic and subserosal 5.3 cm left uterine fibroid. Otherwise negative. Other: Confluent but nonspecific presacral stranding. No pelvic free fluid. Musculoskeletal: Widespread prior lower thoracic posterior laminectomy, decompression. Chronic lower lumbar disc space loss, obliteration with grade 1 anterolisthesis and interbody and posterior element ankylosis. Left side lumbosacral ankylosis. 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 Associated pseudoarthrosis with the left anterior iliac wing. No acute osseous abnormality identified. IMPRESSION: 1. Negative abdominal aorta. 2. There is presacral inflammation with mild focal wall thickening of the posterior rectum suggesting Acute Proctitis. But no source of active GI bleeding is identified by CTA. Redundant large bowel but minimal diverticula. Large hiatal hernia containing most of the stomach and the entire splenic flexure, but no associated bowel obstruction or other complicating features at this time. Nonspecific fibrofatty proliferation of the cecum. 3.  Calcified uterine fibroid. Left nephrolithiasis. ChronicPostoperative changes to the lower thoracic spine posterior elements. Spondylolisthesis with obliterated disc space and ankylosis at L4-L5. 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 Electronically Signed   By: Genevie Ann M.D.   On: 01/27/2021 08:09     Active Problems:   GI bleed   Pressure injury of skin     LOS: 2  days   Tye Savoy ,NP 01/28/2021, 4:00 PM   Addendum: Reviewed and agree with assessment and management plan. For EGD today to eval IDA in setting of Xarelto.  AC has been on hold. The nature of the procedure, as well as the risks, benefits, and alternatives were carefully and thoroughly reviewed with the patient. Ample time for discussion and questions allowed. The patient understood, was satisfied, and agreed to proceed.   Imanuel Pruiett, Lajuan Lines, MD

## 2021-01-29 ENCOUNTER — Encounter (HOSPITAL_COMMUNITY): Payer: Self-pay | Admitting: Internal Medicine

## 2021-01-29 ENCOUNTER — Inpatient Hospital Stay (HOSPITAL_COMMUNITY): Payer: Medicare PPO | Admitting: Anesthesiology

## 2021-01-29 ENCOUNTER — Encounter (HOSPITAL_COMMUNITY)
Admission: EM | Disposition: A | Discharge status: Skilled Nursing Facility | Payer: Self-pay | Source: Skilled Nursing Facility | Attending: Internal Medicine

## 2021-01-29 DIAGNOSIS — K922 Gastrointestinal hemorrhage, unspecified: Secondary | ICD-10-CM | POA: Diagnosis not present

## 2021-01-29 HISTORY — PX: ESOPHAGOGASTRODUODENOSCOPY (EGD) WITH PROPOFOL: SHX5813

## 2021-01-29 LAB — BASIC METABOLIC PANEL
Anion gap: 7 (ref 5–15)
BUN: 5 mg/dL — ABNORMAL LOW (ref 8–23)
CO2: 25 mmol/L (ref 22–32)
Calcium: 8.1 mg/dL — ABNORMAL LOW (ref 8.9–10.3)
Chloride: 110 mmol/L (ref 98–111)
Creatinine, Ser: 0.57 mg/dL (ref 0.44–1.00)
GFR, Estimated: >60 mL/min (ref >60)
Glucose, Bld: 93 mg/dL (ref 70–99)
Potassium: 3.3 mmol/L — ABNORMAL LOW (ref 3.5–5.1)
Sodium: 142 mmol/L (ref 135–145)

## 2021-01-29 LAB — CBC WITH DIFFERENTIAL/PLATELET
Abs Immature Granulocytes: 0.09 10*3/uL — ABNORMAL HIGH (ref 0.00–0.07)
Basophils Absolute: 0 10*3/uL (ref 0.0–0.1)
Basophils Relative: 0 %
Eosinophils Absolute: 0.2 10*3/uL (ref 0.0–0.5)
Eosinophils Relative: 3 %
HCT: 32.5 % — ABNORMAL LOW (ref 36.0–46.0)
Hemoglobin: 10.5 g/dL — ABNORMAL LOW (ref 12.0–15.0)
Immature Granulocytes: 1 %
Lymphocytes Relative: 15 %
Lymphs Abs: 1.1 10*3/uL (ref 0.7–4.0)
MCH: 26.5 pg (ref 26.0–34.0)
MCHC: 32.3 g/dL (ref 30.0–36.0)
MCV: 82.1 fL (ref 80.0–100.0)
Monocytes Absolute: 0.7 10*3/uL (ref 0.1–1.0)
Monocytes Relative: 10 %
Neutro Abs: 5.1 10*3/uL (ref 1.7–7.7)
Neutrophils Relative %: 71 %
Platelets: 155 10*3/uL (ref 150–400)
RBC: 3.96 MIL/uL (ref 3.87–5.11)
RDW: 21.2 % — ABNORMAL HIGH (ref 11.5–15.5)
WBC: 7.1 10*3/uL (ref 4.0–10.5)
nRBC: 1.8 % — ABNORMAL HIGH (ref 0.0–0.2)

## 2021-01-29 SURGERY — ESOPHAGOGASTRODUODENOSCOPY (EGD) WITH PROPOFOL
Anesthesia: Monitor Anesthesia Care

## 2021-01-29 MED ORDER — PEG-KCL-NACL-NASULF-NA ASC-C 100 G PO SOLR
0.5000 | Freq: Once | ORAL | Status: AC
  Administered 2021-01-29: 100 g via ORAL
  Filled 2021-01-29: qty 1

## 2021-01-29 MED ORDER — HYDROXYZINE HCL 25 MG PO TABS
25.0000 mg | ORAL_TABLET | Freq: Three times a day (TID) | ORAL | Status: DC | PRN
  Filled 2021-01-29: qty 1

## 2021-01-29 MED ORDER — PROPOFOL 10 MG/ML IV BOLUS
INTRAVENOUS | Status: DC | PRN
  Administered 2021-01-29: 20 mg via INTRAVENOUS

## 2021-01-29 MED ORDER — HYDROCORTISONE 1 % EX CREA
TOPICAL_CREAM | Freq: Four times a day (QID) | CUTANEOUS | Status: DC
  Administered 2021-02-02 – 2021-02-11 (×4): 1 via TOPICAL
  Filled 2021-01-29 (×2): qty 28

## 2021-01-29 MED ORDER — LACTATED RINGERS IV SOLN
INTRAVENOUS | Status: AC | PRN
  Administered 2021-01-29: 1000 mL via INTRAVENOUS

## 2021-01-29 MED ORDER — HYDROXYZINE HCL 25 MG PO TABS
25.0000 mg | ORAL_TABLET | Freq: Three times a day (TID) | ORAL | Status: DC
  Administered 2021-01-29 – 2021-02-13 (×43): 25 mg via ORAL
  Filled 2021-01-29 (×43): qty 1

## 2021-01-29 MED ORDER — FAMOTIDINE 20 MG PO TABS
20.0000 mg | ORAL_TABLET | Freq: Every day | ORAL | Status: DC
  Administered 2021-01-29 – 2021-02-13 (×15): 20 mg via ORAL
  Filled 2021-01-29 (×15): qty 1

## 2021-01-29 MED ORDER — SODIUM CHLORIDE 0.9 % IV SOLN
250.0000 mg | Freq: Every day | INTRAVENOUS | Status: AC
  Administered 2021-01-30 – 2021-01-31 (×2): 250 mg via INTRAVENOUS
  Filled 2021-01-29 (×3): qty 20

## 2021-01-29 MED ORDER — PROPOFOL 500 MG/50ML IV EMUL
INTRAVENOUS | Status: DC | PRN
  Administered 2021-01-29: 100 ug/kg/min via INTRAVENOUS

## 2021-01-29 MED ORDER — NA FERRIC GLUC CPLX IN SUCROSE 12.5 MG/ML IV SOLN
250.0000 mg | Freq: Every day | INTRAVENOUS | Status: DC
Start: 2021-01-29 — End: 2021-01-29
  Administered 2021-01-29: 250 mg via INTRAVENOUS
  Filled 2021-01-29: qty 20

## 2021-01-29 MED ORDER — PEG-KCL-NACL-NASULF-NA ASC-C 100 G PO SOLR: 1.0000 | Freq: Once | ORAL | Status: DC

## 2021-01-29 SURGICAL SUPPLY — 14 items

## 2021-01-29 NOTE — Transfer of Care (Signed)
Immediate Anesthesia Transfer of Care Note  Patient: Katrina Donaldson  Procedure(s) Performed: ESOPHAGOGASTRODUODENOSCOPY (EGD) WITH PROPOFOL  Patient Location: Endoscopy Unit  Anesthesia Type:MAC  Level of Consciousness: awake and drowsy  Airway & Oxygen Therapy: Patient Spontanous Breathing and Patient connected to nasal cannula oxygen  Post-op Assessment: Report given to RN and Post -op Vital signs reviewed and stable  Post vital signs: Reviewed and stable  Last Vitals:  Vitals Value Taken Time  BP 127/73 01/29/21 0842  Temp    Pulse 85 01/29/21 0842  Resp 13 01/29/21 0842  SpO2 97 % 01/29/21 0842  Vitals shown include unvalidated device data.  Last Pain:  Vitals:   01/29/21 0727  TempSrc: Oral  PainSc: 0-No pain         Complications: No notable events documented.

## 2021-01-29 NOTE — Progress Notes (Signed)
[facility] Progress Note  Patient: Katrina Donaldson    ELF:[id number]  DOA: 01/26/2021     Date of Service: the patient was seen and examined on 01/29/2021  Brief hospital course: Past medical history of SAH, thoracic myelopathy, wheelchair dependent, bedbound, history of DVT on Xarelto, HTN, IDA, hypothyroidism, anxiety, depression.  Presents with complaints of abnormal lab with hemoglobin of 3.0. Suspect upper GI bleed.  GI consulted. Currently plan is f colonoscopy tomorrow and monitor for improvement in rash  Subjective: No acute complaint.  No nausea no vomiting.  No fever no chills.  No further bleeding reported.  The rash that was seen yesterday is progressive.  Assessment and Plan: 1.  GI bleed Symptomatic anemia Suspect acute on chronic blood loss anemia. Hemoglobin 3.0 on admission.  Hemoccult positive. 4 PRBC ordered. Monitor H&H and maintain hemoglobin more than 7. Hold Xarelto. Continue PPI twice daily. CT angio abdomen negative for any acute bleeding episode. Underwent EGD which does not show any evidence of acute bleeding. Scheduled for colonoscopy tomorrow.   2. Hiatal hernia. Placing the patient at high risk for colonoscopy with aspiration. Will monitor.   3.  Hypokalemia Replaced. Will monitor.   4.  History of anxiety and depression Currently stable.  Continue Paxil.   5.  HTN Blood pressure soft. Home blood pressure medications on hold.  Will monitor and initiate Lopressor at lower dose of 25 mg twice a day   6.  History of DVT In 2018. Patient appears to be high risk for recurrent DVT and therefore lifelong anticoagulation. Monitor for now.   7.  Hypothyroidism. Continue Synthroid.   8.  Obesity Placing the patient at high risk for poor outcome.   9.  Medial coccyx stage II pressure ulcer. Present on admission. Continue home dressing.  10.  Maculopapular rash. Patient has a diffuse maculopapular rash on torso and upper  extremities. Etiology most likely BATH SOAP. Schedule Atarax and Pepcid.  Topical hydrocortisone cream.  Monitor for improvement.   Scheduled Meds:  famotidine  20 mg Oral Daily   hydrocortisone cream   Topical QID   hydrOXYzine  25 mg Oral TID   levothyroxine  125 mcg Oral QAC breakfast   metoprolol tartrate  25 mg Oral BID   pantoprazole (PROTONIX) IV  40 mg Intravenous Q12H   PARoxetine  20 mg Oral Daily   peg 3350 powder  0.5 kit Oral Once   Continuous Infusions:  [START ON 01/30/2021] ferric gluconate (FERRLECIT) IVPB     PRN Meds: acetaminophen, baclofen, fluticasone, ondansetron (ZOFRAN) IV, polyethylene glycol  Body mass index is 32.19 kg/m.    Pressure Injury 01/27/21 Coccyx Medial Stage 2 -  Partial thickness loss of dermis presenting as a shallow open injury with a red, pink wound bed without slough. (Active)  01/27/21 0311  Location: Coccyx  Location Orientation: Medial  Staging: Stage 2 -  Partial thickness loss of dermis presenting as a shallow open injury with a red, pink wound bed without slough.  Wound Description (Comments):   Present on Admission:      DVT Prophylaxis: scd   Advance goals of care discussion: Pt is Full code.  Family Communication: no family was present at bedside, at the time of interview.   Data Reviewed: I have personally reviewed and interpreted daily labs, tele strips, imaging. Potassium low.  Hemoglobin stable.  Physical Exam:  General: Appear in mild distress, diffuse maculopapular progressive rash; Oral Mucosa Clear, moist. no Abnormal Neck Mass Or lumps,  Conjunctiva normal  Cardiovascular: S1 and S2 Present, no Murmur, Respiratory: good respiratory effort, Bilateral Air entry present and CTA, no Crackles, no wheezes Abdomen: Bowel Sound present, Soft and no tenderness Extremities: Trace pedal edema Neurology: alert and oriented to time, place, and person affect appropriate. no new focal deficit Gait not checked due to  patient safety concerns   Vitals:   01/29/21 0852 01/29/21 0902 01/29/21 0919 01/29/21 1618  BP: 126/72 125/78 123/75 129/83  Pulse: 88 80 69   Resp: 15 14    Temp:   98.3 F (36.8 C) 98.6 F (37 C)  TempSrc:   Oral Oral  SpO2: 99% 96% 98% 95%  Weight:      Height:        Disposition:  Status is: Inpatient  Remains inpatient appropriate because:Ongoing diagnostic testing needed not appropriate for outpatient work up  Dispo:  Patient From: [place]  Planned Disposition: [place]  Medically stable for discharge: No          Time spent: 35 minutes. I reviewed all nursing notes, pharmacy notes, vitals, pertinent old records. I have discussed plan of care as described above with RN.  Author: Berle Mull, MD [facility] 01/29/2021 7:44 PM  To reach On-call, see care teams to locate the attending and reach out via [email or web address]. Between 7PM-7AM, please contact night-coverage If you still have difficulty reaching the attending provider, please page the [facility] (Director on Call) for [facility] on amion for assistance.

## 2021-01-29 NOTE — Anesthesia Procedure Notes (Signed)
Procedure Name: MAC Date/Time: 01/29/2021 8:16 AM Performed by: Inda Coke, CRNA Pre-anesthesia Checklist: Patient identified, Emergency Drugs available, Suction available, Timeout performed and Patient being monitored Patient Re-evaluated:Patient Re-evaluated prior to induction Oxygen Delivery Method: Nasal cannula Induction Type: IV induction Dental Injury: Teeth and Oropharynx as per pre-operative assessment

## 2021-01-29 NOTE — Op Note (Signed)
[facility] Patient Name: Katrina Donaldson Procedure Date : 01/29/2021 MRN: [id number] Attending MD: Jerene Bears , MD Date of Birth: Dec 22, 1955 CSN: [id number] Age: 65 Admit Type: Inpatient Procedure:                Upper GI endoscopy Indications:              Iron deficiency anemia, Heme positive stool,                            Abnormal CT of the GI tract Providers:                Lajuan Lines. Courtez Twaddle, MD, Hinton Dyer, Elmer Ramp. Tilden Dome, RN,                            Rejeana Brock, CRNA Referring MD:             [facility] Medicines:                Monitored Anesthesia Care Complications:            No immediate complications. Estimated Blood Loss:     Estimated blood loss: none. Procedure:                Pre-Anesthesia Assessment:                           - Prior to the procedure, a History and Physical                            was performed, and patient medications and                            allergies were reviewed. The patient's tolerance of                            previous anesthesia was also reviewed. The risks                            and benefits of the procedure and the sedation                            options and risks were discussed with the patient.                            All questions were answered, and informed consent                            was obtained. Prior Anticoagulants: The patient has                            taken Xarelto (rivaroxaban), last dose was 2 days                            prior to procedure. ASA Grade Assessment: III - A  patient with severe systemic disease. After                            reviewing the risks and benefits, the patient was                            deemed in satisfactory condition to undergo the                            procedure.                           After obtaining informed consent, the endoscope was                            passed under direct vision.  Throughout the                            procedure, the patient's blood pressure, pulse, and                            oxygen saturations were monitored continuously. The                            [id number] Olympus endoscope was introduced                            through the mouth, and advanced to the third part                            of duodenum. The upper GI endoscopy was                            accomplished without difficulty. The patient                            tolerated the procedure well. Scope In: Scope Out: Findings:      The lower third of the esophagus was moderately tortuous.      Normal mucosa was found in the entire esophagus.      An 8 cm hiatal hernia was found (though the exact size is difficult to       measure given anatomic distortion due to large hiatal hernia). The       proximal extent of the gastric folds (end of tubular esophagus) was 31       cm from the incisors. The hiatal narrowing was 39 cm from the incisors.      A deformity was found in the gastric body and in the gastric antrum due       to large hiatal hernia.      Normal mucosa was found in the entire examined stomach. No gastritis,       ulcer, or Cameron's lesions found.      The examined duodenum was normal. Impression:               - Tortuous esophagus due to hiatal hernia.                           -  Normal mucosa was found in the entire esophagus.                           - 8 cm hiatal hernia.                           - Deformity in the gastric anatomy due to the fact                            most of the stomach is located within the large                            hiatal hernia.                           - Normal mucosa was found in the entire stomach.                           - Normal examined duodenum.                           - No UGI source found today to explain IDA and heme                            + stools.                           - No specimens  collected. Moderate Sedation:      N/A Recommendation:           - Return patient to hospital ward for ongoing care.                           - Clear liquid diet.                           - Continue present medications.                           - Perform a colonoscopy tomorrow. Complete                            colonoscopy may not be technically possible given                            colon is contained in hiatal hernia, but given IDA,                            abnormal rectum on CTA-abd/pelvis and lack of prior                            colon screening we will attempt colonoscopy                            tomorrow.                           -  Replace iron IV while here.                           - Continue to hold Xarelto for now. Procedure Code(s):        --- Professional ---                           [phone], Esophagogastroduodenoscopy, flexible,                            transoral; diagnostic, including collection of                            specimen(s) by brushing or washing, when performed                            (separate procedure) Diagnosis Code(s):        --- Professional ---                           Q39.9, Congenital malformation of esophagus,                            unspecified                           K44.9, Diaphragmatic hernia without obstruction or                            gangrene                           K31.89, Other diseases of stomach and duodenum                           D50.9, Iron deficiency anemia, unspecified                           R19.5, Other fecal abnormalities                           R93.3, Abnormal findings on diagnostic imaging of                            other parts of digestive tract CPT copyright 2019 American Medical Association. All rights reserved. The codes documented in this report are preliminary and upon coder review may  be revised to meet current compliance requirements. Jerene Bears, MD 01/29/2021 8:57:44  AM This report has been signed electronically. Number of Addenda: 0

## 2021-01-29 NOTE — Anesthesia Postprocedure Evaluation (Signed)
Anesthesia Post Note  Patient: Katrina Donaldson   Procedure(s) Performed: ESOPHAGOGASTRODUODENOSCOPY (EGD) WITH PROPOFOL     Patient location during evaluation: Endoscopy Anesthesia Type: MAC Level of consciousness: awake and alert Pain management: pain level controlled Vital Signs Assessment: post-procedure vital signs reviewed and stable Respiratory status: spontaneous breathing, nonlabored ventilation and respiratory function stable Cardiovascular status: stable and blood pressure returned to baseline Postop Assessment: no apparent nausea or vomiting Anesthetic complications: no   No notable events documented.  Last Vitals:  Vitals:   01/29/21 0852 01/29/21 0902  BP: 126/72 125/78  Pulse: 88 80  Resp: 15 14  Temp:    SpO2: 99% 96%    Last Pain:  Vitals:   01/29/21 0902  TempSrc:   PainSc: 0-No pain                 Bentlee Benningfield,W. EDMOND

## 2021-01-29 NOTE — Procedures (Addendum)
I attempted to reach the patient's daughter, Shirlee Limerick,.  I tried on 2 occasions and reached a message stating her voicemail was not set up.  Therefore I was unable to leave a message.  I then called her son Marcello Moores, and reached his voicemail.  I did leave a message stating that I was calling him at his mother's request regarding upper endoscopy findings and plan.  I did not leave specific details regarding EGD findings.  I will try him again later.

## 2021-01-29 NOTE — Progress Notes (Addendum)
MEDICATION RELATED CONSULT NOTE - INITIAL   Pharmacy Consult for IV iron Indication: iron deficiency  Allergies  Allergen Reactions   Other Other (See Comments)    Allergic to shellfish such as shrimp     Patient Measurements: Height: 5' 5.5" (166.4 cm) Weight: 89.1 kg (196 lb 6.9 oz) IBW/kg (Calculated) : 58.15   Labs: Recent Labs    01/26/21 2045 01/27/21 1812 01/28/21 0058 01/28/21 0445 01/29/21 0442  WBC 7.3 8.0  --  8.6 7.1  HGB 3.0* 11.6*  11.7* 11.7* 11.1* 10.5*  HCT 11.6* 36.6  37.5 34.8* 33.0* 32.5*  PLT 275 191  --  170 155  CREATININE 0.67 0.56  --  0.56 0.57  MG  --  1.9  --   --   --   ALBUMIN 3.0*  --   --   --   --   PROT 7.3  --   --   --   --   AST 13*  --   --   --   --   ALT 9  --   --   --   --   ALKPHOS 77  --   --   --   --   BILITOT 0.3  --   --   --   --    Estimated Creatinine Clearance: 76.1 mL/min (by C-G formula based on SCr of 0.57 mg/dL).   Medical History: Past Medical History:  Diagnosis Date   Arachnoid cyst    Brain aneurysm    Chronic anticoagulation 05/26/2017   Depression    History of DVT (deep vein thrombosis)    Hypercoagulable state ([place])    Hypertension    Hypothyroidism    Stroke [facility]) 2016   [facility] 11/29/14   Wheelchair bound     Assessment: 65 yo female with IDA/chronic anemia.  Pharmacy asked to replace with IV iron.  Desired Hgb = 12, today is up to 10.5 s/p PRBC.  Calculated iron deficiency ~ 930 mg  Goal of Therapy:  Hgb 12  Plan:  Ferrlecit 250 mg IV x 4 doses.  [place] Crane, Roylene Reason, BCCP Clinical Pharmacist  01/29/2021 9:28 AM   [facility] pharmacy phone numbers are listed on [email or web address]

## 2021-01-29 NOTE — TOC Initial Note (Signed)
Transition of Care [facility]) - Initial/Assessment Note    Patient Details  Name: Katrina Donaldson MRN: [id number] Date of Birth: [birth year]  Transition of Care [facility]) CM/SW Contact:    Vinie Sill, LCSW Phone Number: 01/29/2021, 3:55 PM  Clinical Narrative:                  CSW met with patient at bedside. CSW introduced self and explained role. Patient confirmed she is from [facility] and is agreeable to returning once she is medically stable.  CSW will continue to follow and assist with discharge planning.  Thurmond Butts, MSW, LCSW Clinical Social Worker    Expected Discharge Plan: Skilled Nursing Facility Barriers to Discharge: Continued Medical Work up   Patient Goals and CMS Choice        Expected Discharge Plan and Services Expected Discharge Plan: [place] In-house Referral: Clinical Social Work     Living arrangements for the past 2 months: [place]                                      Prior Living Arrangements/Services Living arrangements for the past 2 months: [place] Lives with:: Facility Resident Patient language and need for interpreter reviewed:: No        Need for Family Participation in Patient Care: Yes (Comment) Care giver support system in place?: Yes (comment)   Criminal Activity/Legal Involvement Pertinent to Current Situation/Hospitalization: No - Comment as needed  Activities of Daily Living      Permission Sought/Granted Permission sought to share information with : Family Supports Permission granted to share information with : Yes, Verbal Permission Granted  Share Information with NAME: Boykin Reaper  Permission granted to share info w AGENCY: [place] granted to share info w Relationship: daughter  Permission granted to share info w Contact Information: [phone]  Emotional Assessment Appearance:: Appears stated age Attitude/Demeanor/Rapport:  Engaged Affect (typically observed): Accepting, Pleasant, Appropriate Orientation: : Oriented to Self, Oriented to Place, Oriented to  Time, Oriented to Situation Alcohol / Substance Use: Not Applicable Psych Involvement: No (comment)  Admission diagnosis:  GI bleed [K92.2] Gastrointestinal hemorrhage, unspecified gastrointestinal hemorrhage type [K92.2] Anemia, unspecified type [D64.9] Patient Active Problem List   Diagnosis Date Noted   Pressure injury of skin 01/27/2021   GI bleed 01/26/2021   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   Chronic anticoagulation 05/26/2017   Urinary retention    Acute pain of right knee    Acute lower UTI    Weakness of right lower extremity    Confusion    Hydrocephalus (HCC)    Poor appetite    Acute blood loss anemia    Post-operative pain    Hypokalemia    Leukocytosis    Thrombocytopenia (HCC)    Acute deep vein thrombosis (DVT) of femoral vein of left lower extremity (HCC)    Myelopathy ([place]) 07/24/2016   Thoracic myelopathy    Depression    Benign essential HTN    History of subarachnoid hemorrhage    Hypercoagulable state ([place])    Constipation due to pain medication    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 [place] [address] Phone: [phone] Fax: [phone]     Social Determinants of Health (SDOH) Interventions    Readmission Risk Interventions No flowsheet data found.

## 2021-01-30 ENCOUNTER — Encounter (HOSPITAL_COMMUNITY): Payer: Self-pay | Admitting: Internal Medicine

## 2021-01-30 DIAGNOSIS — R935 Abnormal findings on diagnostic imaging of other abdominal regions, including retroperitoneum: Secondary | ICD-10-CM | POA: Diagnosis not present

## 2021-01-30 DIAGNOSIS — D509 Iron deficiency anemia, unspecified: Secondary | ICD-10-CM

## 2021-01-30 LAB — CBC WITH DIFFERENTIAL/PLATELET
Abs Immature Granulocytes: 0 10*3/uL (ref 0.00–0.07)
Basophils Absolute: 0 10*3/uL (ref 0.0–0.1)
Basophils Relative: 0 %
Eosinophils Absolute: 0.1 10*3/uL (ref 0.0–0.5)
Eosinophils Relative: 1 %
HCT: 32.6 % — ABNORMAL LOW (ref 36.0–46.0)
Hemoglobin: 10.4 g/dL — ABNORMAL LOW (ref 12.0–15.0)
Lymphocytes Relative: 11 %
Lymphs Abs: 0.8 10*3/uL (ref 0.7–4.0)
MCH: 26.5 pg (ref 26.0–34.0)
MCHC: 31.9 g/dL (ref 30.0–36.0)
MCV: 83 fL (ref 80.0–100.0)
Monocytes Absolute: 0.1 10*3/uL (ref 0.1–1.0)
Monocytes Relative: 1 %
Neutro Abs: 6.3 10*3/uL (ref 1.7–7.7)
Neutrophils Relative %: 87 %
Platelets: 153 10*3/uL (ref 150–400)
RBC: 3.93 MIL/uL (ref 3.87–5.11)
RDW: 21.9 % — ABNORMAL HIGH (ref 11.5–15.5)
WBC: 7.2 10*3/uL (ref 4.0–10.5)
nRBC: 3.1 % — ABNORMAL HIGH (ref 0.0–0.2)
nRBC: 6 /100 WBC — ABNORMAL HIGH

## 2021-01-30 MED ORDER — PEG-KCL-NACL-NASULF-NA ASC-C 100 G PO SOLR
0.5000 | Freq: Once | ORAL | Status: AC
  Administered 2021-01-30: 100 g via ORAL
  Filled 2021-01-30: qty 1

## 2021-01-30 MED ORDER — POTASSIUM CHLORIDE 10 MEQ/100ML IV SOLN
10.0000 meq | INTRAVENOUS | Status: AC
  Administered 2021-01-30: 10 meq via INTRAVENOUS
  Filled 2021-01-30 (×2): qty 100

## 2021-01-30 MED ORDER — PEG-KCL-NACL-NASULF-NA ASC-C 100 G PO SOLR: 1.0000 | Freq: Once | ORAL | Status: DC

## 2021-01-30 MED ORDER — POLYVINYL ALCOHOL 1.4 % OP SOLN
1.0000 [drp] | OPHTHALMIC | Status: DC | PRN
  Administered 2021-01-30 – 2021-02-06 (×4): 1 [drp] via OPHTHALMIC
  Filled 2021-01-30: qty 15

## 2021-01-30 MED ORDER — METHYLPREDNISOLONE SODIUM SUCC 125 MG IJ SOLR
60.0000 mg | Freq: Once | INTRAMUSCULAR | Status: AC
  Administered 2021-01-30: 60 mg via INTRAVENOUS
  Filled 2021-01-30: qty 2

## 2021-01-30 MED ORDER — POTASSIUM CHLORIDE 10 MEQ/100ML IV SOLN
10.0000 meq | Freq: Once | INTRAVENOUS | Status: AC
  Administered 2021-01-30: 10 meq via INTRAVENOUS

## 2021-01-30 MED ORDER — CHLORHEXIDINE GLUCONATE 4 % EX LIQD
Freq: Every day | CUTANEOUS | Status: DC | PRN
  Filled 2021-01-30: qty 15

## 2021-01-30 MED ORDER — SODIUM CHLORIDE 0.9 % IV SOLN: INTRAVENOUS | Status: DC

## 2021-01-30 NOTE — Care Management Important Message (Signed)
Important Message  Patient Details  Name: Katrina Donaldson MRN: [id number] Date of Birth: 1956-01-13   Medicare Important Message Given:  Yes     [name] 01/30/2021, 10:02 AM

## 2021-01-30 NOTE — Progress Notes (Signed)
[facility] Progress Note  Patient: Katrina Donaldson    VKP:[id number]  DOA: 01/26/2021     Date of Service: the patient was seen and examined on 01/30/2021  Brief hospital course: Past medical history of SAH, thoracic myelopathy, wheelchair dependent, bedbound, history of DVT on Xarelto, HTN, IDA, hypothyroidism, anxiety, depression.  Presents with complaints of abnormal lab with hemoglobin of 3.0. Suspect upper GI bleed.  GI consulted. Currently plan is f colonoscopy tomorrow and monitor for improvement in rash  Subjective: No acute complaint.  No nausea no vomiting.  No fever no chills.  No further bleeding reported.  The rash that was seen yesterday is progressive.  Assessment and Plan: 1.  GI bleed Symptomatic anemia Suspect acute on chronic blood loss anemia. Hemoglobin 3.0 on admission.  Hemoccult positive. 4 PRBC given. Monitor H&H and maintain hemoglobin more than 7. Hold Xarelto. Continue PPI twice daily. CT angio abdomen negative for any acute bleeding episode. Underwent EGD which does not show any evidence of acute bleeding. Did not finish bowel prep last night.   Scheduled for colonoscopy tomorrow.   2. Maculopapular rash. Patient has a diffuse maculopapular rash on torso and upper extremities.  Gradually spreading now involving right upper palm area as well as bilateral leg upper thigh area.  This spread is despite using Atarax and Pepcid as well as topical hydrocortisone cream. Etiology most likely BATH SOAP versus linens. Will give 1 dose of steroids and monitor response.   3.  Hypokalemia Replaced. Will monitor.   4.  History of anxiety and depression Currently stable.  Continue Paxil.   5.  HTN Blood pressure soft. Home blood pressure medications on hold.  Will monitor and initiate Lopressor at lower dose of 25 mg twice a day   6.  History of DVT In 2018. Patient appears to be high risk for recurrent DVT and therefore lifelong anticoagulation. Monitor  for now.   7.  Hypothyroidism. Continue Synthroid.   8.  Obesity Placing the patient at high risk for poor outcome.   9.  Medial coccyx stage II pressure ulcer. Present on admission. Continue home dressing.   Scheduled Meds:  famotidine  20 mg Oral Daily   hydrocortisone cream   Topical QID   hydrOXYzine  25 mg Oral TID   levothyroxine  125 mcg Oral QAC breakfast   metoprolol tartrate  25 mg Oral BID   pantoprazole (PROTONIX) IV  40 mg Intravenous Q12H   PARoxetine  20 mg Oral Daily   peg 3350 powder  0.5 kit Oral Once   Continuous Infusions:  sodium chloride 20 mL/hr at 01/30/21 0658   ferric gluconate (FERRLECIT) IVPB 250 mg (01/30/21 1105)   potassium chloride 10 mEq (01/30/21 1735)   PRN Meds: acetaminophen, baclofen, fluticasone, ondansetron (ZOFRAN) IV, polyethylene glycol  Body mass index is 32.19 kg/m.    Pressure Injury 01/27/21 Coccyx Medial Stage 2 -  Partial thickness loss of dermis presenting as a shallow open injury with a red, pink wound bed without slough. (Active)  01/27/21 0311  Location: Coccyx  Location Orientation: Medial  Staging: Stage 2 -  Partial thickness loss of dermis presenting as a shallow open injury with a red, pink wound bed without slough.  Wound Description (Comments):   Present on Admission:      DVT Prophylaxis: scd   Advance goals of care discussion: Pt is Full code.  Family Communication: no family was present at bedside, at the time of interview.   Data Reviewed:  I have personally reviewed and interpreted daily labs, tele strips, imaging. Hemoglobin WBC stable.  Physical Exam: General: Appear in mild distress, diffuse maculopapular rash involving palms; Oral Mucosa Clear, moist. no Abnormal Neck Mass Or lumps, Conjunctiva normal  Cardiovascular: S1 and S2 Present, no Murmur, Respiratory: good respiratory effort, Bilateral Air entry present and CTA, no Crackles, no wheezes Abdomen: Bowel Sound present, Soft and no  tenderness Extremities: no Pedal edema Neurology: alert and oriented to time, place, and person affect appropriate. no new focal deficit Gait not checked due to patient safety concerns    Vitals:   01/30/21 0810 01/30/21 1004 01/30/21 1150 01/30/21 1641  BP: 137/80 (!) 144/79 133/78 137/89  Pulse: 70 70 70 73  Resp: '18 17 18 15  ' Temp: 97.9 F (36.6 C) 98.3 F (36.8 C) 98 F (36.7 C) 98.7 F (37.1 C)  TempSrc: Oral Oral Oral Oral  SpO2: 97% 98% 98% 96%  Weight:      Height:        Disposition:  Status is: Inpatient  Remains inpatient appropriate because:Ongoing diagnostic testing needed not appropriate for outpatient work up  Dispo:  Patient From: [place]  Planned Disposition: [facility]  Medically stable for discharge: No          Time spent: 35 minutes. I reviewed all nursing notes, pharmacy notes, vitals, pertinent old records. I have discussed plan of care as described above with RN.  Author: Berle Mull, MD [facility] 01/30/2021 6:17 PM  To reach On-call, see care teams to locate the attending and reach out via [email or web address]. Between 7PM-7AM, please contact night-coverage If you still have difficulty reaching the attending provider, please page the [facility] (Director on Call) for [facility] on amion for assistance.

## 2021-01-30 NOTE — H&P (View-Only) (Signed)
Progress Note   Subjective  Chief Complaint: Profound anemia, heme positive stool  This morning, the patient was unable to drink the bowel prep overnight.  She tells me she really just did not like the taste of it.  She was able to do the first half but just could not get through the second half.  Tells me that she would like to try and do this again today.  Denies any new complaints or concerns.   Objective   Vital signs in last 24 hours: Temp:  [97.9 F (36.6 C)-99 F (37.2 C)] 98.3 F (36.8 C) (08/02 1004) Pulse Rate:  [70-75] 70 (08/02 1004) Resp:  [16-18] 17 (08/02 1004) BP: (129-144)/(79-98) 144/79 (08/02 1004) SpO2:  [92 %-97 %] 97 % (08/02 0810) Last BM Date: 01/29/21 General:    AA female in NAD Heart:  Regular rate and rhythm; no murmurs Lungs: Respirations even and unlabored, lungs CTA bilaterally Abdomen:  Soft, nontender and nondistended. Normal bowel sounds. Psych:  Cooperative. Normal mood and affect.  Intake/Output from previous day: 08/01 0701 - 08/02 0700 In: 200 [I.V.:200] Out: 0    Lab Results: Recent Labs    01/28/21 0445 01/29/21 0442 01/30/21 0217  WBC 8.6 7.1 7.2  HGB 11.1* 10.5* 10.4*  HCT 33.0* 32.5* 32.6*  PLT 170 155 153   BMET Recent Labs    01/27/21 1812 01/28/21 0445 01/29/21 0442  NA 139 140 142  K 3.7 3.6 3.3*  CL 107 106 110  CO2 '23 25 25  '$ GLUCOSE 96 97 93  BUN 6* <5* 5*  CREATININE 0.56 0.56 0.57  CALCIUM 8.2* 8.1* 8.1*    EGD 01/29/2021 - Tortuous esophagus due to hiatal hernia. - Normal mucosa was found in the entire esophagus. - 8 cm hiatal hernia. - Deformity in the gastric anatomy due to the fact most of the stomach is located within the large hiatal hernia. - Normal mucosa was found in the entire stomach. - Normal examined duodenum. - No UGI source found today to explain IDA and heme + stools. - No specimens collected. Impression: N/A Moderate Sedation: - Return patient to hospital ward for ongoing  care. - Clear liquid diet. - Continue present medications. - Perform a colonoscopy tomorrow. Complete colonoscopy may not be technically possible given colon is contained in hiatal hernia, but given IDA, abnormal rectum on CTA-abd/pelvis and lack of prior colon screening we will attempt colonoscopy tomorrow. - Replace iron IV while here. - Continue to hold Xarelto for now.   Assessment / Plan:   Assessment: 1.  IDA: Acute on chronic anemia on Xarelto, stool on ED providers rectal exam was brown and maroon, EGD yesterday with 8 cm hiatal hernia and tortuous esophagus due to hiatal hernia, otherwise normal 2.  Large hiatal hernia: Obtaining most of the stomach in the entire hepatic flexure, visualized yesterday via EGD, no Cameron erosions 3.  Chronic GERD: Controlled on daily PPI 4.  Acute proctitis on CTA: Some perirectal inflammatory stranding with mild underlying posterior rectal wall thickening, no discrete mass 5.  Bilateral upper extremity chest rash 6.  Thoracic myelopathy  Plan: 1.  Plans are for colonoscopy tomorrow.  Hopefully the patient will be successful with bowel prep overnight.  Did allow her to start at noon today with the second dose around 4:00 with plans to be n.p.o. at midnight. 2.  Continue other supportive measures  Thank you for your kind consultation, we will continue to follow.   LOS: 4  days   Levin Erp  01/30/2021, 10:25 AM

## 2021-01-30 NOTE — Progress Notes (Signed)
Unsure of etiology of pt's rash, gown and linens changed to all cotton, tape changed to paper tape, and all soap that is stocked on the floor has been removed from room and not to be used on pt at MD request.  Baby soap not available in materials mgt, hibacleans ordered to be used as needed for hygiene.

## 2021-01-30 NOTE — Progress Notes (Signed)
Progress Note   Subjective  Chief Complaint: Profound anemia, heme positive stool  This morning, the patient was unable to drink the bowel prep overnight.  She tells me she really just did not like the taste of it.  She was able to do the first half but just could not get through the second half.  Tells me that she would like to try and do this again today.  Denies any new complaints or concerns.   Objective   Vital signs in last 24 hours: Temp:  [97.9 F (36.6 C)-99 F (37.2 C)] 98.3 F (36.8 C) (08/02 1004) Pulse Rate:  [70-75] 70 (08/02 1004) Resp:  [16-18] 17 (08/02 1004) BP: (129-144)/(79-98) 144/79 (08/02 1004) SpO2:  [92 %-97 %] 97 % (08/02 0810) Last BM Date: 01/29/21 General:    AA female in NAD Heart:  Regular rate and rhythm; no murmurs Lungs: Respirations even and unlabored, lungs CTA bilaterally Abdomen:  Soft, nontender and nondistended. Normal bowel sounds. Psych:  Cooperative. Normal mood and affect.  Intake/Output from previous day: 08/01 0701 - 08/02 0700 In: 200 [I.V.:200] Out: 0    Lab Results: Recent Labs    01/28/21 0445 01/29/21 0442 01/30/21 0217  WBC 8.6 7.1 7.2  HGB 11.1* 10.5* 10.4*  HCT 33.0* 32.5* 32.6*  PLT 170 155 153   BMET Recent Labs    01/27/21 1812 01/28/21 0445 01/29/21 0442  NA 139 140 142  K 3.7 3.6 3.3*  CL 107 106 110  CO2 '23 25 25  '$ GLUCOSE 96 97 93  BUN 6* <5* 5*  CREATININE 0.56 0.56 0.57  CALCIUM 8.2* 8.1* 8.1*    EGD 01/29/2021 - Tortuous esophagus due to hiatal hernia. - Normal mucosa was found in the entire esophagus. - 8 cm hiatal hernia. - Deformity in the gastric anatomy due to the fact most of the stomach is located within the large hiatal hernia. - Normal mucosa was found in the entire stomach. - Normal examined duodenum. - No UGI source found today to explain IDA and heme + stools. - No specimens collected. Impression: N/A Moderate Sedation: - Return patient to hospital ward for ongoing  care. - Clear liquid diet. - Continue present medications. - Perform a colonoscopy tomorrow. Complete colonoscopy may not be technically possible given colon is contained in hiatal hernia, but given IDA, abnormal rectum on CTA-abd/pelvis and lack of prior colon screening we will attempt colonoscopy tomorrow. - Replace iron IV while here. - Continue to hold Xarelto for now.   Assessment / Plan:   Assessment: 1.  IDA: Acute on chronic anemia on Xarelto, stool on ED providers rectal exam was brown and maroon, EGD yesterday with 8 cm hiatal hernia and tortuous esophagus due to hiatal hernia, otherwise normal 2.  Large hiatal hernia: Obtaining most of the stomach in the entire hepatic flexure, visualized yesterday via EGD, no Cameron erosions 3.  Chronic GERD: Controlled on daily PPI 4.  Acute proctitis on CTA: Some perirectal inflammatory stranding with mild underlying posterior rectal wall thickening, no discrete mass 5.  Bilateral upper extremity chest rash 6.  Thoracic myelopathy  Plan: 1.  Plans are for colonoscopy tomorrow.  Hopefully the patient will be successful with bowel prep overnight.  Did allow her to start at noon today with the second dose around 4:00 with plans to be n.p.o. at midnight. 2.  Continue other supportive measures  Thank you for your kind consultation, we will continue to follow.   LOS: 4  days   Levin Erp  01/30/2021, 10:25 AM

## 2021-01-31 ENCOUNTER — Inpatient Hospital Stay (HOSPITAL_COMMUNITY): Payer: Medicare PPO | Admitting: Registered Nurse

## 2021-01-31 ENCOUNTER — Encounter (HOSPITAL_COMMUNITY): Payer: Self-pay | Admitting: Internal Medicine

## 2021-01-31 ENCOUNTER — Other Ambulatory Visit: Payer: Self-pay

## 2021-01-31 ENCOUNTER — Encounter (HOSPITAL_COMMUNITY)
Admission: EM | Disposition: A | Discharge status: Skilled Nursing Facility | Payer: Self-pay | Source: Skilled Nursing Facility | Attending: Internal Medicine

## 2021-01-31 DIAGNOSIS — R935 Abnormal findings on diagnostic imaging of other abdominal regions, including retroperitoneum: Secondary | ICD-10-CM | POA: Diagnosis not present

## 2021-01-31 DIAGNOSIS — K6389 Other specified diseases of intestine: Secondary | ICD-10-CM

## 2021-01-31 DIAGNOSIS — K922 Gastrointestinal hemorrhage, unspecified: Secondary | ICD-10-CM | POA: Diagnosis not present

## 2021-01-31 DIAGNOSIS — D490 Neoplasm of unspecified behavior of digestive system: Secondary | ICD-10-CM | POA: Diagnosis not present

## 2021-01-31 HISTORY — PX: COLONOSCOPY WITH PROPOFOL: SHX5780

## 2021-01-31 HISTORY — PX: BIOPSY: SHX5522

## 2021-01-31 LAB — CBC WITH DIFFERENTIAL/PLATELET
Abs Immature Granulocytes: 0.2 10*3/uL — ABNORMAL HIGH (ref 0.00–0.07)
Basophils Absolute: 0 10*3/uL (ref 0.0–0.1)
Basophils Relative: 0 %
Eosinophils Absolute: 0 10*3/uL (ref 0.0–0.5)
Eosinophils Relative: 0 %
HCT: 35.5 % — ABNORMAL LOW (ref 36.0–46.0)
Hemoglobin: 11.2 g/dL — ABNORMAL LOW (ref 12.0–15.0)
Lymphocytes Relative: 4 %
Lymphs Abs: 0.3 10*3/uL — ABNORMAL LOW (ref 0.7–4.0)
MCH: 26.4 pg (ref 26.0–34.0)
MCHC: 31.5 g/dL (ref 30.0–36.0)
MCV: 83.5 fL (ref 80.0–100.0)
Metamyelocytes Relative: 1 %
Monocytes Absolute: 0.2 10*3/uL (ref 0.1–1.0)
Monocytes Relative: 3 %
Myelocytes: 1 %
Neutro Abs: 7 10*3/uL (ref 1.7–7.7)
Neutrophils Relative %: 91 %
Platelets: 151 10*3/uL (ref 150–400)
RBC: 4.25 MIL/uL (ref 3.87–5.11)
RDW: 22.5 % — ABNORMAL HIGH (ref 11.5–15.5)
WBC: 7.7 10*3/uL (ref 4.0–10.5)
nRBC: 5.9 % — ABNORMAL HIGH (ref 0.0–0.2)
nRBC: 8 /100 WBC — ABNORMAL HIGH

## 2021-01-31 LAB — PATHOLOGIST SMEAR REVIEW

## 2021-01-31 SURGERY — COLONOSCOPY WITH PROPOFOL
Anesthesia: Monitor Anesthesia Care

## 2021-01-31 MED ORDER — PROPOFOL 500 MG/50ML IV EMUL
INTRAVENOUS | Status: DC | PRN
  Administered 2021-01-31: 100 ug/kg/min via INTRAVENOUS

## 2021-01-31 MED ORDER — SENNOSIDES-DOCUSATE SODIUM 8.6-50 MG PO TABS: 1.0000 | ORAL_TABLET | Freq: Every evening | ORAL | Status: DC | PRN

## 2021-01-31 MED ORDER — METOPROLOL TARTRATE 5 MG/5ML IV SOLN: 5.0000 mg | INTRAVENOUS | Status: DC | PRN

## 2021-01-31 MED ORDER — PHENYLEPHRINE 40 MCG/ML (10ML) SYRINGE FOR IV PUSH (FOR BLOOD PRESSURE SUPPORT)
PREFILLED_SYRINGE | INTRAVENOUS | Status: DC | PRN
  Administered 2021-01-31 (×2): 120 ug via INTRAVENOUS
  Administered 2021-01-31: 80 ug via INTRAVENOUS

## 2021-01-31 MED ORDER — POTASSIUM CHLORIDE 10 MEQ/100ML IV SOLN
10.0000 meq | INTRAVENOUS | Status: AC
  Administered 2021-01-31 (×4): 10 meq via INTRAVENOUS
  Filled 2021-01-31 (×2): qty 100

## 2021-01-31 MED ORDER — LACTATED RINGERS IV SOLN: INTRAVENOUS | Status: DC

## 2021-01-31 MED ORDER — HYDRALAZINE HCL 20 MG/ML IJ SOLN: 10.0000 mg | INTRAMUSCULAR | Status: DC | PRN

## 2021-01-31 MED ORDER — SODIUM CHLORIDE 0.9 % IV SOLN
2.0000 g | INTRAVENOUS | Status: AC
  Administered 2021-02-01: 2 g via INTRAVENOUS
  Filled 2021-01-31 (×2): qty 2

## 2021-01-31 SURGICAL SUPPLY — 22 items

## 2021-01-31 NOTE — Evaluation (Signed)
Physical Therapy Evaluation Patient Details Name: Katrina Donaldson MRN: [id number] DOB: 1956/04/18 Today's Date: 01/31/2021   History of Present Illness  65 yo female presents to [facility] on 7/29 with n/v from [facility], hgb found to be 3.0 and received PRBC. 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 PMH Includes prior [facility] 2016, HTN, chronic anticoagulation on Xarelto, DVT, depression, UTI, brain aneurysm repair, thoracic laminectomy 2018.  Clinical Impression   Pt presents with long standing LE weakness, max difficulty performing bed mobility tasks, poor skin integrity with skin breakdown on buttocks, impaired activity tolerance. Pt to benefit from acute PT to address deficits. Pt required max +2 for bed mobility and EOB sitting, declined further EOB or OOB activity today. PT to progress mobility as tolerated, and will continue to follow acutely.      Follow Up Recommendations SNF;Supervision/Assistance - 24 hour    Equipment Recommendations  None recommended by PT    Recommendations for Other Services       Precautions / Restrictions Precautions Precautions: Fall Restrictions Weight Bearing Restrictions: No      Mobility  Bed Mobility Overal bed mobility: Needs Assistance Bed Mobility: Rolling;Sidelying to Sit;Sit to Supine Rolling: Max assist;+2 for physical assistance Sidelying to sit: Max assist;+2 for physical assistance   Sit to supine: Max assist;+2 for physical assistance   General bed mobility comments: max +2 for rolling bilat for pericare as pt soiled in urine, max +2 for supine<>sit for trunk and LE management as well as boost up in bed upon return to supine.    Transfers                 General transfer comment: pt declined  Ambulation/Gait                Stairs            Wheelchair Mobility     Modified Rankin (Stroke Patients Only)       Balance Overall balance assessment: Needs assistance Sitting-balance support: No upper extremity supported;Feet supported Sitting balance-Leahy Scale: Fair Sitting balance - Comments: fair to poor; periods of sitting EOB unsupported but at times requires up to mod truncal assist Postural control: Posterior lean     Standing balance comment: nt                             Pertinent Vitals/Pain Pain Assessment: Faces Faces Pain Scale: Hurts whole lot Pain Location: back, when moving Pain Descriptors / Indicators: Discomfort;Grimacing;Moaning Pain Intervention(s): Limited activity within patient's tolerance;Monitored during session;Repositioned    Home Living Family/patient expects to be discharged to:: Skilled nursing facility                      Prior Function Level of Independence: Needs assistance   Gait / Transfers Assistance Needed: stnading with stedy with staff for transfers; use of w/c for mobility sometimes with assist/sometimes without  ADL's / Homemaking Assistance Needed: staff assists with bathing and dressinng; grooming, eating by self        Hand Dominance   Dominant Hand: Right    Extremity/Trunk Assessment   Upper Extremity Assessment Upper Extremity Assessment: Defer to OT evaluation    Lower Extremity Assessment Lower Extremity Assessment: Generalized weakness (unable to perform MMT, pt states "my legs are paralyzed")    Cervical /  Trunk Assessment Cervical / Trunk Assessment: Kyphotic  Communication   Communication: No difficulties  Cognition Arousal/Alertness: Awake/alert Behavior During Therapy: Flat affect Overall Cognitive Status: Impaired/Different from baseline Area of Impairment: Attention;Memory;Following commands;Safety/judgement;Problem solving                   Current Attention Level: Sustained Memory: Decreased short-term memory Following Commands:  Follows one step commands with increased time;Follows one step commands inconsistently Safety/Judgement: Decreased awareness of deficits;Decreased awareness of safety   Problem Solving: Slow processing General Comments: pt requiring repeated cues to understand task requested. Pt resistant to mobility at times, requires cues to let go of bedrails multiple times. Pt can be impulsive/unexpected, was sitting EOB with contact guard and suddenly leaned trunk back onto bed to return to supine without warning.      General Comments General comments (skin integrity, edema, etc.): skin breakdown on sacrum, PT applied new sacral dressing. blisters along posterior iliac wing    Exercises     Assessment/Plan    PT Assessment Patient needs continued PT services  PT Problem List Decreased strength;Decreased mobility;Decreased activity tolerance;Decreased balance;Decreased knowledge of use of DME;Pain;Decreased range of motion;Decreased safety awareness;Obesity;Decreased cognition;Decreased skin integrity       PT Treatment Interventions Therapeutic activities;DME instruction;Therapeutic exercise;Patient/family education;Balance training;Functional mobility training;Neuromuscular re-education    PT Goals (Current goals can be found in the Care Plan section)  Acute Rehab PT Goals Patient Stated Goal: back to [facility] PT Goal Formulation: With patient Time For Goal Achievement: 02/14/21 Potential to Achieve Goals: Good    Frequency Min 2X/week   Barriers to discharge        Co-evaluation PT/OT/SLP Co-Evaluation/Treatment: Yes Reason for Co-Treatment: For patient/therapist safety;To address functional/ADL transfers;Complexity of the patient's impairments (multi-system involvement) PT goals addressed during session: Mobility/safety with mobility;Balance         AM-PAC PT "6 Clicks" Mobility  Outcome Measure Help needed turning from your back to your side while in a flat bed without using  bedrails?: Total Help needed moving from lying on your back to sitting on the side of a flat bed without using bedrails?: Total Help needed moving to and from a bed to a chair (including a wheelchair)?: Total Help needed standing up from a chair using your arms (e.g., wheelchair or bedside chair)?: Total Help needed to walk in hospital room?: Total Help needed climbing 3-5 steps with a railing? : Total 6 Click Score: 6    End of Session   Activity Tolerance: Patient tolerated treatment well Patient left: in bed;with call bell/phone within reach;Other (comment) (bed alarm system on bed not working) Nurse Communication: Mobility status;Other (comment) (PT/OT cleaned up pt, soiled in urine) PT Visit Diagnosis: Other abnormalities of gait and mobility (R26.89)    Time: [id number] PT Time Calculation (min) (ACUTE ONLY): 22 min   Charges:   PT Evaluation $PT Eval Low Complexity: 1 Low         Anderson Middlebrooks S, PT DPT Acute Rehabilitation Services Pager [phone]  Office [phone]   Roxine Caddy E Ruffin Pyo 01/31/2021, 4:52 PM

## 2021-01-31 NOTE — Evaluation (Signed)
Occupational Therapy Evaluation Patient Details Name: Katrina Donaldson MRN: [id number] DOB: 01/28/1956 Today's Date: 01/31/2021    History of Present Illness 65 yo female presents to [facility] on 7/29 with n/v from [facility], hgb found to be 3.0 and received PRBC. 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 PMH Includes prior [facility] 2016, HTN, chronic anticoagulation on Xarelto, DVT, depression, UTI, brain aneurysm repair, thoracic laminectomy 2018.   Clinical Impression   Pt PTA: Pt living at [facility] reports working with therapy and appears very particular about routine. Wanting to watch [employer] and then participate in therapy. Pt currently, limited by decreased cognition, decreased activity tolerance, decreased strength and decreased ability to care for self. Pt with very poor awareness of abilities and deficits. Pt 's family appears supportive in room. MaxA +2 for bed mobility and for lateral scoots. Pt declined OOB. Pt would greatly benefit from continued OT skilled services. OT following acutely.    Follow Up Recommendations  SNF (return to SNF with skilled OT)    Equipment Recommendations  None recommended by OT    Recommendations for Other Services       Precautions / Restrictions Precautions Precautions: Fall Restrictions Weight Bearing Restrictions: No      Mobility Bed Mobility Overal bed mobility: Needs Assistance Bed Mobility: Rolling;Sidelying to Sit;Sit to Supine Rolling: Max assist;+2 for physical assistance Sidelying to sit: Max assist;+2 for physical assistance   Sit to supine: Max assist;+2 for physical assistance   General bed mobility comments: pt performed with very little initiation ; roling side to side with BUEs positioned and BLEs guided.    Transfers                 General transfer comment: pt  declined    Balance Overall balance assessment: Needs assistance Sitting-balance support: No upper extremity supported;Feet supported Sitting balance-Leahy Scale: Fair Sitting balance - Comments: fair to poor; periods of sitting EOB unsupported but at times requires up to mod truncal assist Postural control: Posterior lean     Standing balance comment: NT                           ADL either performed or assessed with clinical judgement   ADL Overall ADL's : Needs assistance/impaired Eating/Feeding: Set up;Sitting   Grooming: Minimal assistance;Sitting   Upper Body Bathing: Minimal assistance;Sitting   Lower Body Bathing: Maximal assistance;+2 for physical assistance;+2 for safety/equipment;Bed level   Upper Body Dressing : Minimal assistance;Sitting   Lower Body Dressing: Maximal assistance;+2 for safety/equipment;Bed level   Toilet Transfer: Total assistance Toilet Transfer Details (indicate cue type and reason): unwilling to participate at [place] and Hygiene: Total assistance Toileting - Clothing Manipulation Details (indicate cue type and reason): lying in soiled bed and did not use call bell.when asked why didn't you tell someone, pt stated "they can smell it."     Functional mobility during ADLs: Maximal assistance;+2 for physical assistance (lateral scoot with bed pad) General ADL Comments: Pt limited by decreased cognition, decreased activity tolerance, decreased strength and decreased ability to care for self.     Vision Baseline Vision/History: Wears glasses Wears Glasses: Reading only Patient Visual Report: No change from baseline Vision Assessment?: No apparent visual deficits     Perception     Praxis      Pertinent Vitals/Pain  Pain Assessment: Faces Faces Pain Scale: Hurts whole lot Pain Location: back, when moving Pain Descriptors / Indicators: Discomfort;Grimacing;Moaning Pain Intervention(s): Monitored  during session;Repositioned     Hand Dominance Right   Extremity/Trunk Assessment Upper Extremity Assessment Upper Extremity Assessment: Generalized weakness   Lower Extremity Assessment Lower Extremity Assessment: Generalized weakness   Cervical / Trunk Assessment Cervical / Trunk Assessment: Kyphotic;Other exceptions (large body habitus)   Communication Communication Communication: No difficulties   Cognition Arousal/Alertness: Awake/alert Behavior During Therapy: Flat affect Overall Cognitive Status: Impaired/Different from baseline Area of Impairment: Attention;Memory;Following commands;Safety/judgement;Problem solving                   Current Attention Level: Sustained Memory: Decreased short-term memory Following Commands: Follows one step commands with increased time;Follows one step commands inconsistently Safety/Judgement: Decreased awareness of deficits;Decreased awareness of safety   Problem Solving: Slow processing General Comments: Pt falling back into bed without warning after specifically saying "we're going to stand at EOB now." Pt unable to recall information from PLOF easily. Pt appears distracted by guests in room. even though they were not talking. pt stating "i'm paralyzed" when asked to move RLE, but stands with therapy at SNF??   General Comments  skin breakdown; soiled sacral pad- replacedand alerted RN of changed linens, but pt in soiled bed to start with.    Exercises     Shoulder Instructions      Home Living Family/patient expects to be discharged to:: Skilled nursing facility                                        Prior Functioning/Environment Level of Independence: Needs assistance  Gait / Transfers Assistance Needed: standing with stedy with staff for transfers; use of w/c for mobility sometimes with assist/sometimes without ADL's / Homemaking Assistance Needed: staff assists with bathing and dressing; grooming,  eating by self   Comments: resides at ALF receiving skilled therapy        OT Problem List: Decreased strength;Decreased safety awareness;Pain      OT Treatment/Interventions: Self-care/ADL training;Therapeutic exercise;Neuromuscular education;Energy conservation;DME and/or AE instruction;Therapeutic activities;Cognitive remediation/compensation;Patient/family education;Balance training    OT Goals(Current goals can be found in the care plan section) Acute Rehab OT Goals Patient Stated Goal: back to [facility] OT Goal Formulation: With patient Time For Goal Achievement: 02/14/21 Potential to Achieve Goals: Good ADL Goals Pt Will Transfer to Toilet: with max assist;squat pivot transfer;bedside commode Pt/caregiver will Perform Home Exercise Program: Increased strength;Both right and left upper extremity;With Supervision Additional ADL Goal #1: Pt will increase to minA for bed mobility to EOB as precursor for OOB ADL.  OT Frequency: Min 2X/week   Barriers to D/C:            Co-evaluation PT/OT/SLP Co-Evaluation/Treatment: Yes Reason for Co-Treatment: Complexity of the patient's impairments (multi-system involvement);For patient/therapist safety PT goals addressed during session: Mobility/safety with mobility;Balance OT goals addressed during session: Strengthening/ROM      AM-PAC OT "6 Clicks" Daily Activity     Outcome Measure Help from another person eating meals?: None Help from another person taking care of personal grooming?: A Little Help from another person toileting, which includes using toliet, bedpan, or urinal?: Total Help from another person bathing (including washing, rinsing, drying)?: A Lot Help from another person to put on and taking off regular upper body clothing?: A Little Help from another person to put on  and taking off regular lower body clothing?: A Lot 6 Click Score: 15   End of Session Nurse Communication: Mobility status;Other (comment) (soiled bed  and blisters on bottom)  Activity Tolerance: Patient limited by fatigue;Patient limited by lethargy Patient left: in bed;with call bell/phone within reach;with family/visitor present  OT Visit Diagnosis: Unsteadiness on feet (R26.81);Muscle weakness (generalized) (M62.81);Pain;Other symptoms and signs involving cognitive function Pain - part of body:  (back)                Time: 1532-1600 OT Time Calculation (min): 28 min Charges:  OT General Charges $OT Visit: 1 Visit OT Evaluation $OT Eval Moderate Complexity: 1 Mod Jefferey Pica, OTR/L Acute Rehabilitation Services Pager: [phone] Office: [place] 01/31/2021, 5:16 PM

## 2021-01-31 NOTE — Plan of Care (Signed)
°  Problem: Clinical Measurements: °Goal: Will remain free from infection °Outcome: Progressing °Goal: Respiratory complications will improve °Outcome: Progressing °  °

## 2021-01-31 NOTE — Anesthesia Preprocedure Evaluation (Signed)
Anesthesia Evaluation  Patient identified by MRN, date of birth, ID band Patient awake    Reviewed: Allergy & Precautions, H&P , NPO status , Patient's Chart, lab work & pertinent test results  Airway Mallampati: III  TM Distance: >3 FB Neck ROM: Full    Dental no notable dental hx. (+) Teeth Intact, Dental Advisory Given   Pulmonary neg pulmonary ROS,    Pulmonary exam normal breath sounds clear to auscultation       Cardiovascular Exercise Tolerance: Good hypertension, Pt. on medications and Pt. on home beta blockers  Rhythm:Regular Rate:Normal     Neuro/Psych Depression CVA, No Residual Symptoms    GI/Hepatic negative GI ROS, Neg liver ROS,   Endo/Other  Hypothyroidism   Renal/GU negative Renal ROS  negative genitourinary   Musculoskeletal   Abdominal (+) + obese,   Peds  Hematology  (+) Blood dyscrasia, anemia ,   Anesthesia Other Findings   Reproductive/Obstetrics negative OB ROS                             Anesthesia Physical  Anesthesia Plan  ASA: 3  Anesthesia Plan: MAC   Post-op Pain Management:    Induction: Intravenous  PONV Risk Score and Plan: 2 and Propofol infusion, Treatment may vary due to age or medical condition and Ondansetron  Airway Management Planned: Simple Face Mask  Additional Equipment:   Intra-op Plan:   Post-operative Plan:   Informed Consent: I have reviewed the patients History and Physical, chart, labs and discussed the procedure including the risks, benefits and alternatives for the proposed anesthesia with the patient or authorized representative who has indicated his/her understanding and acceptance.     Dental advisory given  Plan Discussed with: CRNA  Anesthesia Plan Comments:         Anesthesia Quick Evaluation

## 2021-01-31 NOTE — Procedures (Addendum)
I reached the patient's son Marcello Moores by phone.  We discussed the findings at colonoscopy including the cecal mass, plans for surgical consultation, clear liquid diet for now and remaining off of Xarelto for now.  Biopsies are pending.  I explained to him that she would likely need surgery to remove this large polyp but that the surgical consult team would see her today.  Time provided for questions and answers and he thanked me for the call I was not able to reach her daughter Shirlee Limerick by phone as her number goes straight to voicemail.  Marcello Moores stated that he has been unable to reach her as well but he will continue to try and update her as to her mom's current condition and colonoscopy findings.  Surgical consult called

## 2021-01-31 NOTE — Anesthesia Postprocedure Evaluation (Signed)
Anesthesia Post Note  Patient: Katrina Donaldson  Procedure(s) Performed: COLONOSCOPY WITH PROPOFOL BIOPSY     Patient location during evaluation: PACU Anesthesia Type: MAC Level of consciousness: awake and alert Pain management: pain level controlled Vital Signs Assessment: post-procedure vital signs reviewed and stable Respiratory status: spontaneous breathing, nonlabored ventilation and respiratory function stable Cardiovascular status: blood pressure returned to baseline and stable Postop Assessment: no apparent nausea or vomiting Anesthetic complications: no   No notable events documented.  Last Vitals:  Vitals:   01/31/21 1045 01/31/21 1055  BP: (!) 129/114 114/62  Pulse:    Resp:    Temp: 36.4 C   SpO2:      Last Pain:  Vitals:   01/31/21 1105  TempSrc:   PainSc: 0-No pain                 Lynda Rainwater

## 2021-01-31 NOTE — Transfer of Care (Signed)
Immediate Anesthesia Transfer of Care Note  Patient: Katrina Donaldson  Procedure(s) Performed: COLONOSCOPY WITH PROPOFOL BIOPSY  Patient Location: Endoscopy Unit  Anesthesia Type:MAC  Level of Consciousness: awake  Airway & Oxygen Therapy: Patient Spontanous Breathing  Post-op Assessment: Report given to RN  Post vital signs: Reviewed and stable  Last Vitals:  Vitals Value Taken Time  BP 129/114 01/31/21 1044  Temp    Pulse 78 01/31/21 1046  Resp 17 01/31/21 1046  SpO2 96 % 01/31/21 1046  Vitals shown include unvalidated device data.  Last Pain:  Vitals:   01/31/21 0913  TempSrc: Oral  PainSc: 0-No pain         Complications: No notable events documented.

## 2021-01-31 NOTE — Anesthesia Procedure Notes (Signed)
Date/Time: 01/31/2021 10:04 AM Performed by: Barrington Ellison, CRNA Pre-anesthesia Checklist: Patient identified, Emergency Drugs available, Patient being monitored, Timeout performed and Suction available Oxygen Delivery Method: Nasal cannula

## 2021-01-31 NOTE — Op Note (Signed)
[facility] Patient Name: Katrina Donaldson Procedure Date : 01/31/2021 MRN: [id number] Attending MD: Jerene Bears , MD Date of Birth: Mar 23, 1956 CSN: [id number] Age: 66 Admit Type: Inpatient Procedure:                Colonoscopy Indications:              Iron deficiency anemia, chronic admitted with                            profound anemia (Hgb 3); maroon heme + stools on                            presentation, abnormal CTA abd/pelvis with                            presacral inflammation and mild posterior rectal                            wall thickening, large hiatal hernia containing                            splenic flexure without acute source of GI                            bleeding, recent EGD negative for bleeding source,                            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 Hilarie Fredrickson, MD, Dulcy Fanny, Elspeth Cho                            Tech., Technician, Sampson Si, CRNA Referring MD:             [facility] Medicines:                Monitored Anesthesia Care Complications:            No immediate complications. Estimated Blood Loss:     Estimated blood loss was minimal. Procedure:                Pre-Anesthesia Assessment:                           - Prior to the procedure, a History and Physical                            was performed, and patient medications and                            allergies were reviewed. The patient's tolerance of                            previous anesthesia was also reviewed. The risks  and benefits of the procedure and the sedation                            options and risks were discussed with the patient.                            All questions were answered, and informed consent                            was obtained. Prior Anticoagulants: The patient has                            taken Xarelto (rivaroxaban), last dose was  3 days                            prior to procedure. ASA Grade Assessment: III - A                            patient with severe systemic disease. After                            reviewing the risks and benefits, the patient was                            deemed in satisfactory condition to undergo the                            procedure.                           After obtaining informed consent, the colonoscope                            was passed under direct vision. Throughout the                            procedure, the patient's blood pressure, pulse, and                            oxygen saturations were monitored continuously. The                            PCF-190TL [id number] Olympus colonoscope was                            introduced through the anus and advanced to the                            cecum, identified by appendiceal orifice and                            ileocecal valve. The colonoscopy was performed  without difficulty. The patient tolerated the                            procedure well. The quality of the bowel                            preparation was good. The ileocecal valve,                            appendiceal orifice, and rectum were photographed. Scope In: 10:14:42 AM Scope Out: 10:33:58 AM Scope Withdrawal Time: 0 hours 15 minutes 7 seconds  Total Procedure Duration: 0 hours 19 minutes 16 seconds  Findings:      Skin tags were found on perianal exam.      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       In addition, its diameter measured 40-50 mm. The lesion is located       completely in the cecum and does not extend past the IC valve and thus       tattooing was not performed. It does grow close to and in part around       the appendiceal orifice. No bleeding was present. Mucosa was biopsied       with a cold forceps for histology.      A few small-mouthed diverticula were found in the  ascending colon.      The exam was otherwise without abnormality on direct and retroflexion       views. Impression:               - Perianal skin tags found on perianal exam.                           - Rule out malignancy, tumor in the cecum.                            Biopsied. Likely source of IDA in the setting of                            chronic anticoagulation.                           - Diverticulosis in the ascending colon.                            Nonbleeding.                           - The examination was otherwise normal on direct                            and retroflexion views. Moderate Sedation:      N/A Recommendation:           - Return patient to hospital ward for ongoing care.                           - Clear liquid diet.                           -  Continue present medications.                           - Await pathology results.                           - Surgical consultation for resection as this                            lesion is not felt endoscopically resectable,                            anticoagulation is on hold and the colon is                            prepped. Will keep on clear liquids until surgical                            opinion. Procedure Code(s):        --- Professional ---                           [phone], Colonoscopy, flexible; diagnostic, including                            collection of specimen(s) by brushing or washing,                            when performed (separate procedure) Diagnosis Code(s):        --- Professional ---                           D49.0, Neoplasm of unspecified behavior of                            digestive system                           K64.4, Residual hemorrhoidal skin tags                           D50.9, Iron deficiency anemia, unspecified CPT copyright 2019 [employer]. All rights reserved. The codes documented in this report are preliminary and upon coder review may  be  revised to meet current compliance requirements. Jerene Bears, MD 01/31/2021 10:56:24 AM This report has been signed electronically. Number of Addenda: 0

## 2021-01-31 NOTE — Interval H&P Note (Signed)
History and Physical Interval Note: For colonoscopy today with MAC to eval IDA and abnl CTA abd/pelvis Large HH containing colon may make complete colonoscopy difficult or prevent complete colonoscopy which we have discussed. HIGHER THAN BASELINE RISK.The nature of the procedure, as well as the risks, benefits, and alternatives were carefully and thoroughly reviewed with the patient. Ample time for discussion and questions allowed. The patient understood, was satisfied, and agreed to proceed.   CBC Latest Ref Rng & Units 01/31/2021 01/30/2021 01/29/2021  WBC 4.0 - 10.5 K/uL 7.7 7.2 7.1  Hemoglobin 12.0 - 15.0 g/dL 11.2(L) 10.4(L) 10.5(L)  Hematocrit 36.0 - 46.0 % 35.5(L) 32.6(L) 32.5(L)  Platelets 150 - 400 K/uL 151 153 155     01/31/2021 9:49 AM  Katrina Donaldson  has presented today for surgery, with the diagnosis of IDA and heme + stools.  The various methods of treatment have been discussed with the patient and family. After consideration of risks, benefits and other options for treatment, the patient has consented to  Procedure(s): COLONOSCOPY WITH PROPOFOL (N/A) as a surgical intervention.  The patient's history has been reviewed, patient examined, no change in status, stable for surgery.  I have reviewed the patient's chart and labs.  Questions were answered to the patient's satisfaction.     Lajuan Lines Kayann Maj

## 2021-01-31 NOTE — Consult Note (Signed)
Consult Note  Katrina Donaldson 1956/04/01  [id number].    Requesting MD: Dr. Hilarie Fredrickson Chief Complaint/Reason for Consult: cecal mass  HPI:  65 yo female with medical history significant for thoracic myelopathy, wheelchair dependent, prior SAH, right lower extremity DVT on xarelto, hypertension, iron deficiency anemia, hypothyroidism, chronic anxiety/depression who presented to the [facility] ED on 01/26/21 due to abnormal lab findings through PCP. She had generalized fatigue for weeks leading up to her admission and reports an unintentional weight loss of 10-15 pounds in less than 6 months. Additionally, she had dizziness and emesis x1 prior to admission. She is unsure of her bowel habits (diarrhea, hematochezia or melena) prior to admission as she normally needs assistance 2/2 being wheelchair bound. She was admitted to the inpatient medicine team for gastrointestinal bleed with maroon stool on exam and positive FOBT and Hgb of 3.0.  Since admission GI has been consulted and EGD performed on 8/1. She has a large hiatal hernia (8 cm) otherwise EGD negative. Colonoscopy performed 8/3 showing: A frond-like/villous non-obstructing large mass was found in the cecum. In addition, its diameter measured 40-50 mm. It does not extend past IC valve and there was no bleeding at time of colonoscopy. She has never had a colonoscopy before today. She denies family hx of colon cancer. She reports the only cancer in her immediate family was her sister who had breast cancer.  She has received 4 units of pRBCs since admission. Her current hgb is 11.2.   Blood thinners: Xarelto for DVT held since 7/29 Past Surgeries: Tubal Ligation   ROS: Review of Systems  Constitutional:  Positive for malaise/fatigue and weight loss. Negative for chills and fever.  Respiratory:  Negative for cough and shortness of breath.   Cardiovascular:  Negative for chest pain.  Gastrointestinal:  Positive for nausea and vomiting. Negative  for abdominal pain.  Neurological:  Positive for dizziness.  All other systems reviewed and are negative.  History reviewed. No pertinent family history.  Past Medical History:  Diagnosis Date   Arachnoid cyst    Brain aneurysm    Chronic anticoagulation 05/26/2017   Depression    History of DVT (deep vein thrombosis)    Hypercoagulable state ([place])    Hypertension    Hypothyroidism    Stroke [facility]) 2016   [facility] 11/29/14   Wheelchair bound     Past Surgical History:  Procedure Laterality Date   ABDOMINAL HYSTERECTOMY     patient denies   BRAIN SURGERY     aneurysm   ESOPHAGOGASTRODUODENOSCOPY (EGD) WITH PROPOFOL N/A 01/29/2021   Procedure: ESOPHAGOGASTRODUODENOSCOPY (EGD) WITH PROPOFOL;  Surgeon: Jerene Bears, MD;  Location: [facility] ENDOSCOPY;  Service: Gastroenterology;  Laterality: N/A;   goiter     LAMINECTOMY N/A 07/19/2016   Procedure: LAMINECTOMY THORACIC FOUR THORACIC FIVE, THORACIC SIX TO THORACIC EIGHT,THORACIC TEN TO LUMBAR ONE, FENESTRATION OF ARACHNOID CYSTS;  Surgeon: Consuella Lose, MD;  Location: [place];  Service: Neurosurgery;  Laterality: N/A;   LAMINECTOMY N/A 06/03/2017   Procedure: THORACIC THREE - THORACIC FIVE LAMINECTOMY, FENESTRATION OF ARACHNOID CYST;  Surgeon: Consuella Lose, MD;  Location: [place];  Service: Neurosurgery;  Laterality: N/A;   THYROIDECTOMY     TUBAL LIGATION      Social History:  reports that she has never smoked. She has never used smokeless tobacco. She reports that she does not drink alcohol and does not use drugs. Denies tobacco use No alcohol use or illicit drug use Wheelchair bound Lives  in nursing home Not currently employed Her son lives in the area  Allergies:  Allergies  Allergen Reactions   Other Other (See Comments)    Allergic to shellfish such as shrimp    Shellfish-Derived Products     Medications Prior to Admission  Medication Sig Dispense Refill   acetaminophen (TYLENOL) 325 MG tablet Take 650 mg by mouth  daily as needed for mild pain.     amLODipine (NORVASC) 10 MG tablet Take 1 tablet (10 mg total) by mouth daily. 30 tablet 1   antiseptic oral rinse (BIOTENE) LIQD 30 mLs by Mouth Rinse route 3 (three) times daily as needed for dry mouth.     atorvastatin (LIPITOR) 10 MG tablet Take 10 mg by mouth at bedtime.     azelastine (ASTELIN) 0.1 % nasal spray Place 2 sprays into both nostrils 2 (two) times daily.     baclofen (LIORESAL) 10 MG tablet Take 5-10 mg by mouth 3 (three) times daily as needed for muscle spasms. '5mg'$  daily, an additional '10mg'$  in the afternoon as needed, and '5mg'$  in the evening as needed for muscle spasms.     Dextran 70-Hypromellose (ARTIFICIAL TEARS) 0.1-0.3 % SOLN Place 2 drops into both eyes in the morning, at noon, and at bedtime.     Dextran 70-Hypromellose (GENTEAL TEARS) 0.1-0.3 % SOLN Place 2 drops into both eyes at bedtime.     famotidine (PEPCID) 40 MG tablet Take 40 mg by mouth daily.     fluticasone (FLONASE) 50 MCG/ACT nasal spray Place 1 spray into both nostrils daily as needed for allergies.      folic acid (FOLVITE) 1 MG tablet Take 1 mg by mouth daily.     gabapentin (NEURONTIN) 100 MG capsule Take 100 mg by mouth 2 (two) times daily as needed.     gabapentin (NEURONTIN) 400 MG capsule Take 400 mg by mouth at bedtime.     gabapentin (NEURONTIN) 600 MG tablet Take 600 mg by mouth 2 (two) times daily.     Glycerin-Hypromellose-PEG 400 (ARTIFICIAL TEARS) 0.2-0.2-1 % SOLN Place 1 drop into both eyes every 2 (two) hours as needed (dry eyes).     hydrochlorothiazide (MICROZIDE) 12.5 MG capsule Take 12.5 mg by mouth daily.     levothyroxine (SYNTHROID) 125 MCG tablet Take 125 mcg by mouth daily before breakfast.     Menthol, Topical Analgesic, (BIOFREEZE) 4 % GEL Apply 1 application topically in the morning and at bedtime. For shoulder pain     metoprolol succinate (TOPROL-XL) 100 MG 24 hr tablet Take 100 mg by mouth daily.     Multiple Vitamin (MULTIVITAMIN WITH  MINERALS) TABS tablet Take 1 tablet by mouth daily.     Olopatadine HCl 0.2 % SOLN Place 2 drops into both eyes daily.     PARoxetine (PAXIL) 10 MG tablet Take 10 mg by mouth daily.     polyethylene glycol powder (GLYCOLAX/MIRALAX) 17 GM/SCOOP powder Take 17 g by mouth daily as needed for mild constipation.     rivaroxaban (XARELTO) 20 MG TABS tablet Take 1 tablet (20 mg total) by mouth daily with supper. (Patient taking differently: Take 20 mg by mouth at bedtime.) 30 tablet 1   sennosides-docusate sodium (SENOKOT-S) 8.6-50 MG tablet Take 2 tablets by mouth daily.     Simethicone 180 MG CAPS Take 180-360 mg by mouth in the morning, at noon, in the evening, and at bedtime. '180mg'$  three times daily continuously, and '360mg'$  additionally as needed for flatulence.  sodium chloride (OCEAN) 0.65 % SOLN nasal spray Place 2 sprays into both nostrils 3 (three) times daily as needed for congestion.      Blood pressure 114/62, pulse 69, temperature 97.6 F (36.4 C), temperature source Oral, resp. rate 17, height 5' 5.5" (1.664 m), weight 89.1 kg, SpO2 96 %. Physical Exam:  General: pleasant, WD, female who is laying in bed in NAD HEENT: head is normocephalic, atraumatic.  Sclera are noninjected. Exophthalmos noted. PERRL. Ears and nose without any masses or lesions.  Mouth is pink and moist. Dentition fair. Heart: regular, rate, and rhythm.  Normal s1,s2. No obvious murmurs, gallops, or rubs noted.  Palpable radial and pedal pulses bilaterally Lungs: CTAB, no wheezes, rhonchi, or rales noted.  Respiratory effort nonlabored Abd: Soft, NT, ND, +BS, no masses, hernias, or organomegaly MS: all 4 extremities are symmetrical with no cyanosis, clubbing, or edema. Skin: Non-specific rash to her chest wall and upper chest. Skin was otherwise warm and dry with no masses or lesions Neuro: Cranial nerves 2-12 grossly intact, intact sensation to the upper and lower extremities. Moves upper extremities spontaneously.  Wiggles toes on command. Gait not assessed. Psych: A&Ox4 with an appropriate affect. Able to answer questions appropriately.   Results for orders placed or performed during the hospital encounter of 01/26/21 (from the past 48 hour(s))  CBC with Differential/Platelet     Status: Abnormal   Collection Time: 01/30/21  2:17 AM  Result Value Ref Range   WBC 7.2 4.0 - 10.5 K/uL   RBC 3.93 3.87 - 5.11 MIL/uL   Hemoglobin 10.4 (L) 12.0 - 15.0 g/dL   HCT 32.6 (L) 36.0 - 46.0 %   MCV 83.0 80.0 - 100.0 fL   MCH 26.5 26.0 - 34.0 pg   MCHC 31.9 30.0 - 36.0 g/dL   RDW 21.9 (H) 11.5 - 15.5 %   Platelets 153 150 - 400 K/uL    Comment: REPEATED TO VERIFY   nRBC 3.1 (H) 0.0 - 0.2 %   Neutrophils Relative % 87 %   Neutro Abs 6.3 1.7 - 7.7 K/uL   Lymphocytes Relative 11 %   Lymphs Abs 0.8 0.7 - 4.0 K/uL   Monocytes Relative 1 %   Monocytes Absolute 0.1 0.1 - 1.0 K/uL   Eosinophils Relative 1 %   Eosinophils Absolute 0.1 0.0 - 0.5 K/uL   Basophils Relative 0 %   Basophils Absolute 0.0 0.0 - 0.1 K/uL   WBC Morphology MORPHOLOGY UNREMARKABLE    RBC Morphology See Note     Comment: POLYCHROMASIA PRESENT   Smear Review MORPHOLOGY UNREMARKABLE    nRBC 6 (H) 0 /100 WBC   Abs Immature Granulocytes 0.00 0.00 - 0.07 K/uL   Polychromasia PRESENT     Comment: Performed at [facility], [address] [place], [place] [address]  Pathologist smear review     Status: None   Collection Time: 01/30/21  2:17 AM  Result Value Ref Range   Path Review Normocytic anemia     Comment: Increased nRBCs Correlate with clinical history Reviewed by Mark S. Martinique, M.D. 01/30/2021 Performed at [facility], [place] [address] [place], [place] [address]   CBC with Differential/Platelet     Status: Abnormal   Collection Time: 01/31/21  1:15 AM  Result Value Ref Range   WBC 7.7 4.0 - 10.5 K/uL   RBC 4.25 3.87 - 5.11 MIL/uL   Hemoglobin 11.2 (L) 12.0 - 15.0 g/dL   HCT 35.5 (L)  36.0 - 46.0 %   MCV 83.5 80.0  - 100.0 fL   MCH 26.4 26.0 - 34.0 pg   MCHC 31.5 30.0 - 36.0 g/dL   RDW 22.5 (H) 11.5 - 15.5 %   Platelets 151 150 - 400 K/uL    Comment: REPEATED TO VERIFY   nRBC 5.9 (H) 0.0 - 0.2 %   Neutrophils Relative % 91 %   Neutro Abs 7.0 1.7 - 7.7 K/uL   Lymphocytes Relative 4 %   Lymphs Abs 0.3 (L) 0.7 - 4.0 K/uL   Monocytes Relative 3 %   Monocytes Absolute 0.2 0.1 - 1.0 K/uL   Eosinophils Relative 0 %   Eosinophils Absolute 0.0 0.0 - 0.5 K/uL   Basophils Relative 0 %   Basophils Absolute 0.0 0.0 - 0.1 K/uL   nRBC 8 (H) 0 /100 WBC   Metamyelocytes Relative 1 %   Myelocytes 1 %   Abs Immature Granulocytes 0.20 (H) 0.00 - 0.07 K/uL    Comment: Performed at [facility], [address] [place], [place] [address]   No results found.  Anti-infectives (From admission, onward)    None        Assessment/Plan Cecal mass - Will order CT chest/abdomen/pelvis & CEA. Bx from Colonoscopy pending.  - Plan for OR tomorrow for Laparoscopic Assisted partial colectomy with possible ileostomy with Dr. Grandville Silos. The anatomy and physiology of the GI tract was discussed at length with the patient including the use of pictures/diagrams. The planned procedure and risks were discussed with the patient. Risks include but are not limited to anesthesia (MI, CVA, death), pain, bleeding, infection, scarring, need for blood transfusion, damage to surrounding structures (blood vessels/nerves/viscus/organs/ureter), leak from anastomosis, DVT/PE, hernia and risk of worsening of pre-existing medical conditions. We discussed the possibility of an ileostomy. Additionally we discussed typical postoperative expectations and the recovery process.The patient's questions were answered to their satisfaction, they voiced understanding and elected to proceed with surgery.   FEN: Clears, NPO MN for OR ID: Cefotetan periop VTE: SCDs, continue to hold [place]. PA-C [facility] 01/31/2021,  11:19 AM Please see Amion for pager number during day hours 7:00am-4:30pm

## 2021-01-31 NOTE — Progress Notes (Signed)
"Admitted PROGRESS NOTE    Katrina Donaldson  [id number] DOB: 1956/02/11 DOA: 01/26/2021 PCP: Donald Prose, MD   Brief Narrative:  52 Past medical history of SAH, thoracic myelopathy, wheelchair dependent, bedbound, history of DVT on Xarelto, HTN, IDA, hypothyroidism, anxiety, depression.  Presents with complaints of abnormal lab with hemoglobin of 3.0. Suspect upper GI bleed.  GI consulted.   Assessment & Plan:   Active Problems:   GI bleed   Pressure injury of skin   Abnormal computed tomography angiography (CTA) of abdomen and pelvis  GI bleed likely from cecal mass Symptomatic anemia; Iron Deficiency.  Acute proctitis -Admission hemoglobin 3.0.  Hemoccult positive.  Status post 4 unit PRBC transfusion - PPI twice daily.  Xarelto on hold - CTA-negative for acute bleeding - Colonoscopy-showed cecal mass.  Biopsies are - General surgery consulted. May need resection. - Check CEA levels  Maculopapular rash; allergic dermatitis-improved -Torso and upper extremity.  On Pepcid and topical hydrocortisone.  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   Medial coccyx stage II pressure ulcer; POA Continue home dressing.       DVT prophylaxis:   SCDs Code Status: Full Family Communication:  Doenst  me to update any family members today, RN present in the room with me.   Status is: Inpatient  Remains inpatient appropriate because:Inpatient level of care appropriate due to severity of illness  Dispo:  Patient From: [place]  Planned Disposition: [place]  Medically stable for discharge: No         Nutritional status           Body mass index is 32.19  kg/m.  Pressure Injury 01/27/21 Coccyx Medial Stage 2 -  Partial thickness loss of dermis presenting as a shallow open injury with a red, pink wound bed without slough. (Active)  01/27/21 0311  Location: Coccyx  Location Orientation: Medial  Staging: Stage 2 -  Partial thickness loss of dermis presenting as a shallow open injury with a red, pink wound bed without slough.  Wound Description (Comments):   Present on Admission:           Subjective: Tolerated her C scope, doing ok. Tells me her rash is much better on her UE and torso.   Review of Systems Otherwise negative except as per HPI, including: General: Denies fever, chills, night sweats or unintended weight loss. Resp: Denies cough, wheezing, shortness of breath. Cardiac: Denies chest pain, palpitations, orthopnea, paroxysmal nocturnal dyspnea. GI: Denies abdominal pain, nausea, vomiting, diarrhea or constipation GU: Denies dysuria, frequency, hesitancy or incontinence MS: Denies muscle aches, joint pain or swelling Neuro: Denies headache, neurologic deficits (focal weakness, numbness, tingling), abnormal gait Psych: Denies anxiety, depression, SI/HI/AVH Skin: Denies new rashes or lesions ID: Denies sick contacts, exotic exposures, travel  Examination:  General exam: Appears calm and comfortable  Respiratory system: Clear to auscultation. Respiratory effort normal. Cardiovascular system: S1 & S2 heard, RRR. No JVD, murmurs, rubs, gallops or clicks. No pedal edema. Gastrointestinal system: Abdomen is nondistended, soft and nontender. No organomegaly or masses felt. Normal bowel sounds heard. Central nervous system: Alert and oriented. No focal neurological deficits. Extremities: Symmetric 5 x 5 power. Skin: Mild maculopapular rash  over upper extremities shoulders.  Improved Psychiatry: Judgement and insight appear normal. Mood & affect appropriate.     Objective: Vitals:   01/30/21 2013 01/30/21 2356 01/31/21  0400 01/31/21 0804  BP: (!) 141/78 (!) 144/81 (!) 146/86 (!) 147/83  Pulse: 77 78 70 72  Resp: '18 18 18 16  '$ Temp: (!) 96.6 F (35.9 C) 98 F (36.7 C) 98.1 F (36.7 C) 98.1 F (36.7 C)  TempSrc: Oral Oral Oral Oral  SpO2: 97% 96% 96% 99%  Weight:      Height:        Intake/Output Summary (Last 24 hours) at 01/31/2021 0847 Last data filed at 01/31/2021 0420 Gross per 24 hour  Intake 819.83 ml  Output --  Net 819.83 ml   Filed Weights   01/27/21 0129 01/29/21 0727  Weight: 89.1 kg 89.1 kg     Data Reviewed:   CBC: Recent Labs  Lab 01/26/21 2045 01/27/21 1812 01/28/21 0058 01/28/21 0445 01/29/21 0442 01/30/21 0217 01/31/21 0115  WBC 7.3 8.0  --  8.6 7.1 7.2 7.7  NEUTROABS 4.4  --   --   --  5.1 6.3 7.0  HGB 3.0* 11.6*  11.7* 11.7* 11.1* 10.5* 10.4* 11.2*  HCT 11.6* 36.6  37.5 34.8* 33.0* 32.5* 32.6* 35.5*  MCV 69.5* 84.3  --  80.5 82.1 83.0 83.5  PLT 275 191  --  170 155 153 [id number]   Basic Metabolic Panel: Recent Labs  Lab 01/26/21 2045 01/27/21 1812 01/28/21 0445 01/29/21 0442  NA 139 139 140 142  K 3.0* 3.7 3.6 3.3*  CL 107 107 106 110  CO2 '25 23 25 25  '$ GLUCOSE 105* 96 97 93  BUN 7* 6* <5* 5*  CREATININE 0.67 0.56 0.56 0.57  CALCIUM 8.3* 8.2* 8.1* 8.1*  MG  --  1.9  --   --    GFR: Estimated Creatinine Clearance: 76.1 mL/min (by C-G formula based on SCr of 0.57 mg/dL). Liver Function Tests: Recent Labs  Lab 01/26/21 2045  AST 13*  ALT 9  ALKPHOS 77  BILITOT 0.3  PROT 7.3  ALBUMIN 3.0*   No results for input(s): LIPASE, AMYLASE in the last 168 hours. No results for input(s): AMMONIA in the last 168 hours. Coagulation Profile: No results for input(s): INR, PROTIME in the last 168 hours. Cardiac Enzymes: No results for input(s): CKTOTAL, CKMB, CKMBINDEX, TROPONINI in the last 168 hours. BNP (last 3 results) No results for input(s): PROBNP in the last 8760 hours. HbA1C: No results for input(s): HGBA1C in the last 72 hours. CBG: No results  for input(s): GLUCAP in the last 168 hours. Lipid Profile: No results for input(s): CHOL, HDL, LDLCALC, TRIG, CHOLHDL, LDLDIRECT in the last 72 hours. Thyroid Function Tests: No results for input(s): TSH, T4TOTAL, FREET4, T3FREE, THYROIDAB in the last 72 hours. Anemia Panel: No results for input(s): VITAMINB12, FOLATE, FERRITIN, TIBC, IRON, RETICCTPCT in the last 72 hours. Sepsis Labs: No results for input(s): PROCALCITON, LATICACIDVEN in the last 168 hours.  Recent Results (from the past 240 hour(s))  SARS CORONAVIRUS 2 (TAT 6-24 HRS) Nasopharyngeal Nasopharyngeal Swab     Status: None   Collection Time: 01/26/21 11:00 PM   Specimen: Nasopharyngeal Swab  Result Value Ref Range Status   SARS Coronavirus 2 NEGATIVE NEGATIVE Final    Comment: (NOTE) SARS-CoV-2 target nucleic acids are NOT DETECTED.  The SARS-CoV-2 RNA is generally detectable in upper and lower respiratory specimens during the acute phase of infection. Negative results do  not preclude SARS-CoV-2 infection, do not rule out co-infections with other pathogens, and should not be used as the sole basis for treatment or other patient management decisions. Negative results must be combined with clinical observations, patient history, and epidemiological information. The expected result is Negative.  Fact Sheet for Patients: SugarRoll.be  Fact Sheet for Healthcare Providers: https://www.woods-mathews.com/  This test is not yet approved or cleared by the [place] FDA and  has been authorized for detection and/or diagnosis of SARS-CoV-2 by FDA under an Emergency Use Authorization (EUA). This EUA will remain  in effect (meaning this test can be used) for the duration of the COVID-19 declaration under Se ction 564(b)(1) of the Act, 21 U.S.C. section 360bbb-3(b)(1), unless the authorization is terminated or revoked sooner.  Performed at [facility], [place] [address],  [place], [place] [address]          Radiology Studies: No results found.      Scheduled Meds:  famotidine  20 mg Oral Daily   hydrocortisone cream   Topical QID   hydrOXYzine  25 mg Oral TID   levothyroxine  125 mcg Oral QAC breakfast   metoprolol tartrate  25 mg Oral BID   pantoprazole (PROTONIX) IV  40 mg Intravenous Q12H   PARoxetine  20 mg Oral Daily   Continuous Infusions:  sodium chloride 20 mL/hr at 01/31/21 0403   ferric gluconate (FERRLECIT) IVPB 250 mg (01/30/21 1105)     LOS: 5 days   Time spent= 35 mins    Minnetta Sandora Arsenio Loader, MD [facility]  If 7PM-7AM, please contact night-coverage  01/31/2021, 8:47 AM

## 2021-02-01 ENCOUNTER — Inpatient Hospital Stay (HOSPITAL_COMMUNITY): Payer: Medicare PPO

## 2021-02-01 ENCOUNTER — Encounter (HOSPITAL_COMMUNITY): Payer: Self-pay | Admitting: Internal Medicine

## 2021-02-01 ENCOUNTER — Inpatient Hospital Stay (HOSPITAL_COMMUNITY): Payer: Medicare PPO | Admitting: Anesthesiology

## 2021-02-01 ENCOUNTER — Encounter (HOSPITAL_COMMUNITY)
Admission: EM | Disposition: A | Discharge status: Skilled Nursing Facility | Payer: Self-pay | Source: Skilled Nursing Facility | Attending: Internal Medicine

## 2021-02-01 DIAGNOSIS — K922 Gastrointestinal hemorrhage, unspecified: Secondary | ICD-10-CM | POA: Diagnosis not present

## 2021-02-01 DIAGNOSIS — K6389 Other specified diseases of intestine: Secondary | ICD-10-CM | POA: Diagnosis not present

## 2021-02-01 HISTORY — PX: LAPAROSCOPIC PARTIAL COLECTOMY: SHX5907

## 2021-02-01 LAB — BASIC METABOLIC PANEL
Anion gap: 9 (ref 5–15)
BUN: 5 mg/dL — ABNORMAL LOW (ref 8–23)
CO2: 23 mmol/L (ref 22–32)
Calcium: 8.3 mg/dL — ABNORMAL LOW (ref 8.9–10.3)
Chloride: 111 mmol/L (ref 98–111)
Creatinine, Ser: 0.65 mg/dL (ref 0.44–1.00)
GFR, Estimated: >60 mL/min (ref >60)
Glucose, Bld: 83 mg/dL (ref 70–99)
Potassium: 3.2 mmol/L — ABNORMAL LOW (ref 3.5–5.1)
Sodium: 143 mmol/L (ref 135–145)

## 2021-02-01 LAB — CBC
HCT: 33.1 % — ABNORMAL LOW (ref 36.0–46.0)
Hemoglobin: 10.2 g/dL — ABNORMAL LOW (ref 12.0–15.0)
MCH: 26.4 pg (ref 26.0–34.0)
MCHC: 30.8 g/dL (ref 30.0–36.0)
MCV: 85.8 fL (ref 80.0–100.0)
Platelets: 133 10*3/uL — ABNORMAL LOW (ref 150–400)
RBC: 3.86 MIL/uL — ABNORMAL LOW (ref 3.87–5.11)
RDW: 23.6 % — ABNORMAL HIGH (ref 11.5–15.5)
WBC: 8.6 10*3/uL (ref 4.0–10.5)
nRBC: 4.9 % — ABNORMAL HIGH (ref 0.0–0.2)

## 2021-02-01 LAB — GLUCOSE, CAPILLARY: Glucose-Capillary: 87 mg/dL (ref 70–99)

## 2021-02-01 LAB — MAGNESIUM: Magnesium: 1.9 mg/dL (ref 1.7–2.4)

## 2021-02-01 LAB — SURGICAL PATHOLOGY

## 2021-02-01 LAB — CEA: CEA: 1.2 ng/mL (ref 0.0–4.7)

## 2021-02-01 LAB — SURGICAL PCR SCREEN
MRSA, PCR: NEGATIVE
Staphylococcus aureus: NEGATIVE

## 2021-02-01 SURGERY — LAPAROSCOPIC PARTIAL COLECTOMY
Anesthesia: General | Site: Abdomen

## 2021-02-01 MED ORDER — PHENYLEPHRINE HCL-NACL 20-0.9 MG/250ML-% IV SOLN
INTRAVENOUS | Status: DC | PRN
  Administered 2021-02-01: 50 ug/min via INTRAVENOUS

## 2021-02-01 MED ORDER — SODIUM CHLORIDE 0.9 % IR SOLN
Status: DC | PRN
  Administered 2021-02-01: 3000 mL

## 2021-02-01 MED ORDER — CHLORHEXIDINE GLUCONATE 0.12 % MT SOLN: 15.0000 mL | Freq: Once | OROMUCOSAL | Status: AC

## 2021-02-01 MED ORDER — LACTATED RINGERS IV SOLN: INTRAVENOUS | Status: DC | PRN

## 2021-02-01 MED ORDER — IOHEXOL 300 MG/ML  SOLN
100.0000 mL | Freq: Once | INTRAMUSCULAR | Status: AC
  Administered 2021-02-01: 100 mL via INTRAVENOUS

## 2021-02-01 MED ORDER — BUPIVACAINE-EPINEPHRINE 0.25% -1:200000 IJ SOLN
INTRAMUSCULAR | Status: DC | PRN
  Administered 2021-02-01: 13 mL

## 2021-02-01 MED ORDER — FENTANYL CITRATE (PF) 250 MCG/5ML IJ SOLN
INTRAMUSCULAR | Status: DC | PRN
  Administered 2021-02-01: 100 ug via INTRAVENOUS

## 2021-02-01 MED ORDER — MIDAZOLAM HCL 2 MG/2ML IJ SOLN
INTRAMUSCULAR | Status: AC
  Filled 2021-02-01: qty 2

## 2021-02-01 MED ORDER — SUGAMMADEX SODIUM 200 MG/2ML IV SOLN
INTRAVENOUS | Status: DC | PRN
  Administered 2021-02-01: 200 mg via INTRAVENOUS

## 2021-02-01 MED ORDER — ORAL CARE MOUTH RINSE: 15.0000 mL | Freq: Once | OROMUCOSAL | Status: AC

## 2021-02-01 MED ORDER — POTASSIUM CHLORIDE 10 MEQ/100ML IV SOLN
10.0000 meq | INTRAVENOUS | Status: AC
Start: 2021-02-01 — End: 2021-02-01
  Administered 2021-02-01 (×4): 10 meq via INTRAVENOUS
  Filled 2021-02-01: qty 100

## 2021-02-01 MED ORDER — PROPOFOL 10 MG/ML IV BOLUS
INTRAVENOUS | Status: DC | PRN
  Administered 2021-02-01: 130 mg via INTRAVENOUS

## 2021-02-01 MED ORDER — FENTANYL CITRATE (PF) 250 MCG/5ML IJ SOLN
INTRAMUSCULAR | Status: AC
  Filled 2021-02-01: qty 5

## 2021-02-01 MED ORDER — LACTATED RINGERS IV SOLN: INTRAVENOUS | Status: DC

## 2021-02-01 MED ORDER — MENTHOL 3 MG MT LOZG
1.0000 | LOZENGE | OROMUCOSAL | Status: DC | PRN
  Filled 2021-02-01: qty 9

## 2021-02-01 MED ORDER — ROCURONIUM BROMIDE 10 MG/ML (PF) SYRINGE
PREFILLED_SYRINGE | INTRAVENOUS | Status: DC | PRN
Start: 2021-02-01 — End: 2021-02-01
  Administered 2021-02-01: 50 mg via INTRAVENOUS

## 2021-02-01 MED ORDER — CHLORHEXIDINE GLUCONATE CLOTH 2 % EX PADS
6.0000 | MEDICATED_PAD | Freq: Every day | CUTANEOUS | Status: DC
  Administered 2021-02-02 – 2021-02-06 (×5): 6 via TOPICAL

## 2021-02-01 MED ORDER — CHLORHEXIDINE GLUCONATE 0.12 % MT SOLN
OROMUCOSAL | Status: AC
  Administered 2021-02-01: 15 mL via OROMUCOSAL
  Filled 2021-02-01: qty 15

## 2021-02-01 MED ORDER — SUCCINYLCHOLINE CHLORIDE 200 MG/10ML IV SOSY
PREFILLED_SYRINGE | INTRAVENOUS | Status: DC | PRN
  Administered 2021-02-01: 120 mg via INTRAVENOUS

## 2021-02-01 MED ORDER — PROPOFOL 10 MG/ML IV BOLUS
INTRAVENOUS | Status: AC
  Filled 2021-02-01: qty 20

## 2021-02-01 MED ORDER — BUPIVACAINE-EPINEPHRINE (PF) 0.25% -1:200000 IJ SOLN
INTRAMUSCULAR | Status: AC
  Filled 2021-02-01: qty 30

## 2021-02-01 SURGICAL SUPPLY — 74 items
APPLIER CLIP ROT 10 11.4 M/L (STAPLE)
BAG COUNTER SPONGE SURGICOUNT (BAG) ×2 IMPLANT
BLADE CLIPPER SURG (BLADE) IMPLANT
CANISTER SUCT 3000ML PPV (MISCELLANEOUS) ×2 IMPLANT
CELLS DAT CNTRL 66122 CELL SVR (MISCELLANEOUS) ×1 IMPLANT
CHLORAPREP W/TINT 26 (MISCELLANEOUS) ×2 IMPLANT
CLIP APPLIE ROT 10 11.4 M/L (STAPLE) IMPLANT
COVER MAYO STAND STRL (DRAPES) ×2 IMPLANT
COVER SURGICAL LIGHT HANDLE (MISCELLANEOUS) ×4 IMPLANT
DRAPE HALF SHEET 40X57 (DRAPES) IMPLANT
DRAPE UTILITY XL STRL (DRAPES) ×2 IMPLANT
DRAPE WARM FLUID 44X44 (DRAPES) ×2 IMPLANT
DRSG OPSITE POSTOP 4X10 (GAUZE/BANDAGES/DRESSINGS) ×2 IMPLANT
DRSG OPSITE POSTOP 4X8 (GAUZE/BANDAGES/DRESSINGS) IMPLANT
DRSG TEGADERM 4X4.75 (GAUZE/BANDAGES/DRESSINGS) ×2 IMPLANT
ELECT BLADE 6.5 EXT (BLADE) ×2 IMPLANT
ELECT CAUTERY BLADE 6.4 (BLADE) ×2 IMPLANT
ELECT REM PT RETURN 9FT ADLT (ELECTROSURGICAL) ×2
ELECTRODE REM PT RTRN 9FT ADLT (ELECTROSURGICAL) ×1 IMPLANT
GAUZE SPONGE 4X4 16PLY XRAY LF (GAUZE/BANDAGES/DRESSINGS) ×2 IMPLANT
GEL ULTRASOUND 20GR AQUASONIC (MISCELLANEOUS) IMPLANT
GLOVE SRG 8 PF TXTR STRL LF DI (GLOVE) ×2 IMPLANT
GLOVE SURG ENC MOIS LTX SZ8 (GLOVE) ×4 IMPLANT
GLOVE SURG UNDER POLY LF SZ8 (GLOVE) ×4
GOWN STRL REUS W/ TWL LRG LVL3 (GOWN DISPOSABLE) ×6 IMPLANT
GOWN STRL REUS W/ TWL XL LVL3 (GOWN DISPOSABLE) ×2 IMPLANT
GOWN STRL REUS W/TWL LRG LVL3 (GOWN DISPOSABLE) ×12
GOWN STRL REUS W/TWL XL LVL3 (GOWN DISPOSABLE) ×4
HANDLE SUCTION POOLE (INSTRUMENTS) ×1 IMPLANT
IRRIG SUCT STRYKERFLOW 2 WTIP (MISCELLANEOUS) ×2
IRRIGATION SUCT STRKRFLW 2 WTP (MISCELLANEOUS) ×1 IMPLANT
KIT BASIN OR (CUSTOM PROCEDURE TRAY) ×2 IMPLANT
KIT SIGMOIDOSCOPE (SET/KITS/TRAYS/PACK) IMPLANT
LEGGING LITHOTOMY PAIR STRL (DRAPES) IMPLANT
NEEDLE 22X1 1/2 (OR ONLY) (NEEDLE) ×2 IMPLANT
NS IRRIG 1000ML POUR BTL (IV SOLUTION) ×4 IMPLANT
PAD ARMBOARD 7.5X6 YLW CONV (MISCELLANEOUS) ×4 IMPLANT
PENCIL SMOKE EVACUATOR (MISCELLANEOUS) ×4 IMPLANT
RELOAD PROXIMATE 75MM BLUE (ENDOMECHANICALS) ×6 IMPLANT
RTRCTR WOUND ALEXIS 18CM MED (MISCELLANEOUS) ×2
SCISSORS LAP 5X35 DISP (ENDOMECHANICALS) ×2 IMPLANT
SET IRRIG TUBING LAPAROSCOPIC (IRRIGATION / IRRIGATOR) IMPLANT
SET TUBE SMOKE EVAC HIGH FLOW (TUBING) ×2 IMPLANT
SHEARS HARMONIC ACE PLUS 36CM (ENDOMECHANICALS) ×2 IMPLANT
SLEEVE ENDOPATH XCEL 5M (ENDOMECHANICALS) ×2 IMPLANT
SPECIMEN JAR LARGE (MISCELLANEOUS) ×2 IMPLANT
SPONGE T-LAP 18X18 ~~LOC~~+RFID (SPONGE) ×6 IMPLANT
STAPLER GUN LINEAR PROX 60 (STAPLE) ×2 IMPLANT
STAPLER PROXIMATE 75MM BLUE (STAPLE) ×4 IMPLANT
STAPLER VISISTAT 35W (STAPLE) ×4 IMPLANT
SUCTION POOLE HANDLE (INSTRUMENTS) ×2
SURGILUBE 2OZ TUBE FLIPTOP (MISCELLANEOUS) IMPLANT
SUT PDS AB 1 CTX 36 (SUTURE) ×2 IMPLANT
SUT PDS AB 1 TP1 96 (SUTURE) ×4 IMPLANT
SUT PROLENE 2 0 CT2 30 (SUTURE) IMPLANT
SUT PROLENE 2 0 KS (SUTURE) IMPLANT
SUT SILK 2 0 SH CR/8 (SUTURE) ×4 IMPLANT
SUT SILK 2 0 TIES 10X30 (SUTURE) ×4 IMPLANT
SUT SILK 3 0 SH CR/8 (SUTURE) ×2 IMPLANT
SUT SILK 3 0 TIES 10X30 (SUTURE) ×2 IMPLANT
SYR BULB IRRIG 60ML STRL (SYRINGE) ×2 IMPLANT
SYS LAPSCP GELPORT 120MM (MISCELLANEOUS)
SYSTEM LAPSCP GELPORT 120MM (MISCELLANEOUS) IMPLANT
TOWEL GREEN STERILE (TOWEL DISPOSABLE) ×4 IMPLANT
TRAY FOLEY MTR SLVR 16FR STAT (SET/KITS/TRAYS/PACK) ×2 IMPLANT
TRAY LAPAROSCOPIC MC (CUSTOM PROCEDURE TRAY) ×2 IMPLANT
TROCAR XCEL 12X100 BLDLESS (ENDOMECHANICALS) IMPLANT
TROCAR XCEL BLUNT TIP 100MML (ENDOMECHANICALS) ×2 IMPLANT
TROCAR XCEL NON-BLD 11X100MML (ENDOMECHANICALS) IMPLANT
TROCAR XCEL NON-BLD 5MMX100MML (ENDOMECHANICALS) ×2 IMPLANT
TUBE CONNECTING 12X1/4 (SUCTIONS) ×4 IMPLANT
TUBING EVAC SMOKE HEATED PNEUM (TUBING) ×2 IMPLANT
WATER STERILE IRR 1000ML POUR (IV SOLUTION) ×2 IMPLANT
YANKAUER SUCT BULB TIP NO VENT (SUCTIONS) ×4 IMPLANT

## 2021-02-01 NOTE — Anesthesia Procedure Notes (Signed)
Central Venous Catheter Insertion Performed by: [name], MD, anesthesiologist Start/End8/09/2020 9:05 AM, 02/01/2021 9:15 AM Patient location: Pre-op. Preanesthetic checklist: patient identified, IV checked, site marked, risks and benefits discussed, surgical consent, monitors and equipment checked, pre-op evaluation, timeout performed and anesthesia consent Position: Trendelenburg Lidocaine 1% used for infiltration and patient sedated Hand hygiene performed , maximum sterile barriers used  and Seldinger technique used Catheter size: 8 Fr Total catheter length 16. Central line was placed.Double lumen Procedure performed using ultrasound guided technique. Ultrasound Notes:anatomy identified, needle tip was noted to be adjacent to the nerve/plexus identified, no ultrasound evidence of intravascular and/or intraneural injection and image(s) printed for medical record Attempts: 1 Following insertion, dressing applied, line sutured and Biopatch. Post procedure assessment: blood return through all ports  Patient tolerated the procedure well with no immediate complications.

## 2021-02-01 NOTE — Transfer of Care (Signed)
Immediate Anesthesia Transfer of Care Note  Patient: Katrina Donaldson  Procedure(s) Performed: LAPAROSCOPIC ASSISTED PARTIAL COLECTOMY (Abdomen)  Patient Location: PACU  Anesthesia Type:General  Level of Consciousness: awake, alert  and oriented  Airway & Oxygen Therapy: Patient Spontanous Breathing and Patient connected to face mask oxygen  Post-op Assessment: Report given to RN and Post -op Vital signs reviewed and stable  Post vital signs: Reviewed and stable  Last Vitals:  Vitals Value Taken Time  BP 137/90 02/01/21 1125  Temp    Pulse 87 02/01/21 1128  Resp 16 02/01/21 1128  SpO2 99 % 02/01/21 1128  Vitals shown include unvalidated device data.  Last Pain:  Vitals:   02/01/21 0832  TempSrc: Oral  PainSc: 0-No pain      Patients Stated Pain Goal: 4 ([id number])  Complications: No notable events documented.

## 2021-02-01 NOTE — Anesthesia Preprocedure Evaluation (Addendum)
Anesthesia Evaluation  Patient identified by MRN, date of birth, ID band Patient awake    Reviewed: Allergy & Precautions, NPO status , Patient's Chart, lab work & pertinent test results  Airway Mallampati: III  TM Distance: >3 FB Neck ROM: Full    Dental  (+) Teeth Intact, Dental Advisory Given, Missing   Pulmonary    Pulmonary exam normal        Cardiovascular hypertension, Pt. on medications and Pt. on home beta blockers  Rhythm:Regular Rate:Normal     Neuro/Psych PSYCHIATRIC DISORDERS Depression CVA    GI/Hepatic Neg liver ROS, GERD  Medicated,  Endo/Other  Hypothyroidism   Renal/GU negative Renal ROS  negative genitourinary   Musculoskeletal negative musculoskeletal ROS (+)   Abdominal Normal abdominal exam  (+) - obese,   Peds  Hematology   Anesthesia Other Findings   Reproductive/Obstetrics                            Anesthesia Physical Anesthesia Plan  ASA: 3  Anesthesia Plan: General   Post-op Pain Management:    Induction: Intravenous  PONV Risk Score and Plan: 4 or greater and Ondansetron, Dexamethasone, Midazolam and Treatment may vary due to age or medical condition  Airway Management Planned: Oral ETT  Additional Equipment: None  Intra-op Plan:   Post-operative Plan: Extubation in OR  Informed Consent: I have reviewed the patients History and Physical, chart, labs and discussed the procedure including the risks, benefits and alternatives for the proposed anesthesia with the patient or authorized representative who has indicated his/her understanding and acceptance.     Dental advisory given  Plan Discussed with: CRNA  Anesthesia Plan Comments:        Anesthesia Quick Evaluation

## 2021-02-01 NOTE — Progress Notes (Signed)
"Admitted PROGRESS NOTE    Katrina Donaldson  [id number] DOB: 07/04/1955 DOA: 01/26/2021 PCP: Donald Prose, MD   Brief Narrative:  62 Past medical history of SAH, thoracic myelopathy, wheelchair dependent, bedbound, history of DVT on Xarelto, HTN, IDA, hypothyroidism, anxiety, depression.  Presents with complaints of abnormal lab with hemoglobin of 3.0. Suspect upper GI bleed.  GI consulted.  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  General surgery was consulted for resection.  Patient underwent laparoscopic colectomy on 8/4 by surgery.   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  Acute proctitis -Admission hemoglobin 3.0.  Hemoccult positive.  Status post 4 unit PRBC transfusion - PPI twice daily.  Xarelto on hold - CTA-negative for acute bleeding - Colonoscopy-showed cecal mass.  Biopsy sent - General surgery-resection status post laparoscopic colectomy 8/4 - CEA-pending -CT chest abdomen pelvis-cecal mass, large hiatal hernia, chronic passive atelectasis, nonobstructing renal stone  Large hiatal hernia with intrathoracic stomach, severe left hemidiaphragm - PPI.  Otherwise management per surgery  Maculopapular rash; allergic dermatitis-improved -Torso and upper extremity.  On Pepcid and topical hydrocortisone.  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   Medial coccyx stage II pressure ulcer;  POA Continue home dressing.       DVT prophylaxis: Place and maintain sequential compression device Start: 01/31/21 1217  SCDs Code Status: Full Family Communication:  Brother at bedside  Status is: Inpatient  Remains inpatient appropriate because:Inpatient level of care appropriate due to severity of illness  Dispo:  Patient From: [place]  Planned Disposition: [place]  Medically stable for discharge: No         Nutritional status           Body mass index is 32.19 kg/m.  Pressure Injury 01/27/21 Coccyx Medial Stage 2 -  Partial thickness loss of dermis presenting as a shallow open injury with a red, pink wound bed without slough. (Active)  01/27/21 0311  Location: Coccyx  Location Orientation: Medial  Staging: Stage 2 -  Partial thickness loss of dermis presenting as a shallow open injury with a red, pink wound bed without slough.  Wound Description (Comments):   Present on Admission:           Subjective: OR this morning for lap colectomy. Doing ok. No complaints at this time.   Review of Systems Otherwise negative except as per HPI, including: General = no fevers, chills, dizziness,  fatigue HEENT/EYES = negative for loss of vision, double vision, blurred vision,  sore throa Cardiovascular= negative for chest pain, palpitation Respiratory/lungs= negative for shortness of breath, cough, wheezing; hemoptysis,  Gastrointestinal= negative for nausea, vomiting, abdominal pain Genitourinary= negative for Dysuria MSK = Negative for arthralgia, myalgias Neurology= Negative for headache, numbness, tingling  Psychiatry= Negative for suicidal and homocidal ideation Skin= Negative for Rash   Examination:  Constitutional: Not in acute distress Respiratory: Clear to auscultation bilaterally Cardiovascular: Normal  sinus rhythm, no rubs Abdomen: Nontender nondistended good bowel sounds Musculoskeletal: No edema noted Skin:  No rashes seen Neurologic: CN 2-12 grossly intact.  And nonfocal Psychiatric: Normal judgment and insight. Alert and oriented x 3. Normal mood.  Surgical site looks ok.     Objective: Vitals:   01/31/21 1649 01/31/21 1950 01/31/21 2300 02/01/21 0402  BP: (!) 145/69 139/72 132/75 133/67  Pulse:  69 64 66  Resp: '18 18 18 18  '$ Temp: 97.8 F (36.6 C) 98 F (36.7 C) 98.3 F (36.8 C) (!) 96.7 F (35.9 C)  TempSrc: Oral Oral Oral Oral  SpO2: 96% 98% 97% 96%  Weight:      Height:        Intake/Output Summary (Last 24 hours) at 02/01/2021 [id number] Last data filed at 01/31/2021 2200 Gross per 24 hour  Intake 935.17 ml  Output --  Net 935.17 ml   Filed Weights   01/27/21 0129 01/29/21 0727 01/31/21 0913  Weight: 89.1 kg 89.1 kg 89.1 kg     Data Reviewed:   CBC: Recent Labs  Lab 01/26/21 2045 01/27/21 1812 01/28/21 0058 01/28/21 0445 01/29/21 0442 01/30/21 0217 01/31/21 0115  WBC 7.3 8.0  --  8.6 7.1 7.2 7.7  NEUTROABS 4.4  --   --   --  5.1 6.3 7.0  HGB 3.0* 11.6*  11.7* 11.7* 11.1* 10.5* 10.4* 11.2*  HCT 11.6* 36.6  37.5 34.8* 33.0* 32.5* 32.6* 35.5*  MCV 69.5* 84.3  --  80.5 82.1 83.0 83.5  PLT 275 191  --  170 155 153 [id number]   Basic Metabolic Panel: Recent Labs  Lab 01/26/21 2045 01/27/21 1812 01/28/21 0445 01/29/21 0442 02/01/21 0636  NA 139 139 140 142 143  K 3.0* 3.7 3.6 3.3* 3.2*  CL 107 107 106 110 111  CO2 '25 23 25 25 23  '$ GLUCOSE 105* 96 97 93 83  BUN 7* 6* <5* 5* 5*  CREATININE 0.67 0.56 0.56 0.57 0.65  CALCIUM 8.3* 8.2* 8.1* 8.1* 8.3*  MG  --  1.9  --   --  1.9   GFR: Estimated Creatinine Clearance: 76.1 mL/min (by C-G formula based on SCr of 0.65 mg/dL). Liver Function Tests: Recent Labs  Lab 01/26/21 2045  AST 13*  ALT 9  ALKPHOS 77  BILITOT 0.3  PROT 7.3  ALBUMIN 3.0*   No results for input(s): LIPASE, AMYLASE in the last 168 hours. No results for input(s): AMMONIA in the last 168 hours. Coagulation Profile: No results for input(s):  INR, PROTIME in the last 168 hours. Cardiac Enzymes: No results for input(s): CKTOTAL, CKMB, CKMBINDEX, TROPONINI in the last 168 hours. BNP (last 3 results) No results for input(s): PROBNP in the last 8760 hours. HbA1C: No results for input(s): HGBA1C in the last 72 hours. CBG: Recent Labs  Lab 02/01/21 0621  GLUCAP 87   Lipid Profile: No results for input(s): CHOL, HDL, LDLCALC, TRIG, CHOLHDL, LDLDIRECT in the last 72 hours. Thyroid Function Tests: No results for input(s): TSH, T4TOTAL, FREET4, T3FREE, THYROIDAB in the last 72 hours. Anemia Panel: No results for input(s): VITAMINB12, FOLATE, FERRITIN, TIBC, IRON, RETICCTPCT in the last 72 hours. Sepsis Labs: No results for input(s): PROCALCITON, LATICACIDVEN in the last 168 hours.  Recent Results (from the past 240 hour(s))  SARS CORONAVIRUS 2 (TAT 6-24 HRS) Nasopharyngeal Nasopharyngeal Swab     Status: None   Collection Time: 01/26/21 11:00 PM   Specimen: Nasopharyngeal Swab  Result Value Ref Range Status  SARS Coronavirus 2 NEGATIVE NEGATIVE Final    Comment: (NOTE) SARS-CoV-2 target nucleic acids are NOT DETECTED.  The SARS-CoV-2 RNA is generally detectable in upper and lower respiratory specimens during the acute phase of infection. Negative results do not preclude SARS-CoV-2 infection, do not rule out co-infections with other pathogens, and should not be used as the sole basis for treatment or other patient management decisions. Negative results must be combined with clinical observations, patient history, and epidemiological information. The expected result is Negative.  Fact Sheet for Patients: SugarRoll.be  Fact Sheet for Healthcare Providers: https://www.woods-mathews.com/  This test is not yet approved or cleared by the [place] FDA and  has been authorized for detection and/or diagnosis of SARS-CoV-2 by FDA under an Emergency Use Authorization (EUA). This EUA  will remain  in effect (meaning this test can be used) for the duration of the COVID-19 declaration under Se ction 564(b)(1) of the Act, 21 U.S.C. section 360bbb-3(b)(1), unless the authorization is terminated or revoked sooner.  Performed at [facility], [place] [address] [place], [place] [address]   Surgical pcr screen     Status: None   Collection Time: 01/31/21  9:19 PM   Specimen: Nasal Mucosa; Nasal Swab  Result Value Ref Range Status   MRSA, PCR NEGATIVE NEGATIVE Final   Staphylococcus aureus NEGATIVE NEGATIVE Final    Comment: (NOTE) The Xpert SA Assay (FDA approved for NASAL specimens in patients 14 years of age and older), is one component of a comprehensive surveillance program. It is not intended to diagnose infection nor to guide or monitor treatment. Performed at [facility], [place] [address], [place], [place] [address]          Radiology Studies: No results found.      Scheduled Meds:  chlorhexidine  15 mL Mouth/Throat Once   Or   mouth rinse  15 mL Mouth Rinse Once   chlorhexidine       [MAR Hold] famotidine  20 mg Oral Daily   [MAR Hold] hydrocortisone cream   Topical QID   [MAR Hold] hydrOXYzine  25 mg Oral TID   [MAR Hold] levothyroxine  125 mcg Oral QAC breakfast   [MAR Hold] metoprolol tartrate  25 mg Oral BID   [MAR Hold] pantoprazole (PROTONIX) IV  40 mg Intravenous Q12H   [MAR Hold] PARoxetine  20 mg Oral Daily   Continuous Infusions:  cefoTEtan (CEFOTAN) IV     [MAR Hold] ferric gluconate (FERRLECIT) IVPB 250 mg (01/31/21 1202)   lactated ringers 10 mL/hr at 01/31/21 0944   lactated ringers       LOS: 6 days   Time spent= 35 mins    Valla Pacey Arsenio Loader, MD [facility]  If 7PM-7AM, please contact night-coverage  02/01/2021, 8:22 AM

## 2021-02-01 NOTE — OR Nursing (Signed)
Patient sent down to Pre-Op with one wedding ring and telemetry. Both returned to the floor and given in hand to [facility], NT.

## 2021-02-01 NOTE — Anesthesia Procedure Notes (Signed)
Procedure Name: Intubation Date/Time: 02/01/2021 9:42 AM Performed by: Mariea Clonts, CRNA Pre-anesthesia Checklist: Patient identified, Emergency Drugs available, Suction available and Patient being monitored Patient Re-evaluated:Patient Re-evaluated prior to induction Oxygen Delivery Method: Circle System Utilized Preoxygenation: Pre-oxygenation with 100% oxygen Induction Type: IV induction Laryngoscope Size: Miller and 2 Grade View: Grade I Tube type: Oral Tube size: 7.5 mm Number of attempts: 1 Airway Equipment and Method: Stylet and Oral airway Placement Confirmation: ETT inserted through vocal cords under direct vision, positive ETCO2 and breath sounds checked- equal and bilateral Tube secured with: Tape Dental Injury: Teeth and Oropharynx as per pre-operative assessment

## 2021-02-01 NOTE — Anesthesia Postprocedure Evaluation (Signed)
Anesthesia Post Note  Patient: Katrina Donaldson  Procedure(s) Performed: LAPAROSCOPIC ASSISTED PARTIAL COLECTOMY (Abdomen)     Patient location during evaluation: PACU Anesthesia Type: General Level of consciousness: awake and alert Pain management: pain level controlled Vital Signs Assessment: post-procedure vital signs reviewed and stable Respiratory status: spontaneous breathing, nonlabored ventilation, respiratory function stable and patient connected to nasal cannula oxygen Cardiovascular status: blood pressure returned to baseline and stable Postop Assessment: no apparent nausea or vomiting Anesthetic complications: no   No notable events documented.  Last Vitals:  Vitals:   02/01/21 1210 02/01/21 1243  BP: 139/86 (!) 142/81  Pulse: 85 83  Resp: 17 16  Temp: (!) 36.2 C 36.8 C  SpO2: 99% 98%    Last Pain:  Vitals:   02/01/21 1243  TempSrc: Oral  PainSc:                  [name]

## 2021-02-01 NOTE — Progress Notes (Signed)
Pt received from PACU. VSS. Abdominal incisions clean, dry and intact. Call light in reach.  Clyde Canterbury, RN

## 2021-02-01 NOTE — Plan of Care (Signed)

## 2021-02-01 NOTE — Op Note (Signed)
  02/01/2021  11:05 AM  PATIENT:  Katrina Donaldson  65 y.o. female  PRE-OPERATIVE DIAGNOSIS:  Cecal mass with bleeding  POST-OPERATIVE DIAGNOSIS:   Cecal mass with bleeding  PROCEDURE:  Procedure(s): LAPAROSCOPIC ASSISTED RIGHT COLECTOMY  SURGEON:  Surgeon(s): Georganna Skeans, MD  ASSISTANTS: none   ANESTHESIA:   general  EBL:  Total I/O In: 1100 [I.V.:900; IV Piggyback:200] Out: 315 [Urine:165; Blood:150]  BLOOD ADMINISTERED:none  DRAINS: none   SPECIMEN:  Excision  DISPOSITION OF SPECIMEN:  PATHOLOGY  COUNTS:  YES  DICTATION: .Dragon Dictation Findings: Cecal mass  Procedure in detail: Informed consent was obtained.  She was given intravenous antibiotics.  She was brought to the operating room and general endotracheal anesthesia was administered by the anesthesia staff.  Her abdomen was prepped and draped in a sterile fashion.  We did a timeout procedure.The supraumbilical region was infiltrated with local. Supraumbilical incision was made. Subcutaneous tissues were dissected down revealing the anterior fascia. This was divided sharply along the midline. Peritoneal cavity was entered under direct vision without complication. A 0 Vicryl pursestring was placed around the fascial opening. Hassan trocar was inserted into the abdomen. The abdomen was insufflated with carbon dioxide in standard fashion. Under direct vision a 5 mm left lower quadrant and a 5 mm left upper quadrant port were placed.  Local was used at each port site.  Laparoscopic exploration revealed the cecum with some palpable fullness.  I mobilized the right colon using harmonic scalpel along the lateral sidewall attachments.  I continued the mobilization up around the hepatic flexure.  The right colon was quite mobile.  Once I had it moving nicely I made a small upper midline incision by extending the supraumbilical port site.  I placed a small wound protector and then delivered the right colon out through the wound.   I placed multiple towels around the wound protector in accordance with the colon protocol.  I divided the colon proximal to the middle colic vessels using GIA 75 stapler.  I divided the terminal ileum with a GIA 75 stapler.  I then took down the mesentery using the harmonic scalpel.  I placed several stitches in the right colic vessels to get good hemostasis.  The specimen was sent to pathology.  I ensured hemostasis along the mesentery.  I then did a side to side anastomosis between the distal ileum and the transverse colon.  I used a GIA 75 stapler.  The common defect was closed with a TA 60.  I placed 2 apical stitches of 2-0 silk.  I placed multiple 2 oh silks along the staple line for hemostasis.  The anastomosis was pink and viable.  It was widely patent.  I then reinspected the mesentery and there was no bleeding.  The area was copiously irrigated.  We then changed our gowns, gloves, instruments and placed new drapes in accordance with the colon protocol.  The midline fascia was closed with running #1 PDS.  Skin was closed with staples and the port sites were closed with staples.  Sterile dressings were applied all counts were correct.  She tolerated the procedure well without apparent complication and was taken recovery in stable condition.  PATIENT DISPOSITION:  PACU - hemodynamically stable.   Delay start of Pharmacological VTE agent (>24hrs) due to surgical blood loss or risk of bleeding:  no  Georganna Skeans, MD, MPH, FACS Pager: [phone]  8/4/202211:05 AM

## 2021-02-01 NOTE — Progress Notes (Signed)
Patient ID: Elleanor Zywicki, female   DOB: July 04, 1955, 65 y.o.   MRN: [id number] 1 Day Post-Op   Subjective: No new complaints, had some questions about surgery ROS negative except as listed above. Objective: Vital signs in last 24 hours: Temp:  [96.7 F (35.9 C)-98.3 F (36.8 C)] 96.7 F (35.9 C) (08/04 0402) Pulse Rate:  [64-72] 66 (08/04 0402) Resp:  [16-18] 18 (08/04 0402) BP: (114-152)/(62-114) 133/67 (08/04 0402) SpO2:  [95 %-99 %] 96 % (08/04 0402) Weight:  [89.1 kg] 89.1 kg (08/03 0913) Last BM Date: 01/31/21  Intake/Output from previous day: 08/03 0701 - 08/04 0700 In: 935.2 [P.O.:480; I.V.:355.2; IV Piggyback:100] Out: -  Intake/Output this shift: No intake/output data recorded.  General appearance: cooperative Resp: clear to auscultation bilaterally Cardio: regular rate and rhythm GI: soft, NT  Lab Results: CBC  Recent Labs    01/30/21 0217 01/31/21 0115  WBC 7.2 7.7  HGB 10.4* 11.2*  HCT 32.6* 35.5*  PLT 153 151   BMET No results for input(s): NA, K, CL, CO2, GLUCOSE, BUN, CREATININE, CALCIUM in the last 72 hours. PT/INR No results for input(s): LABPROT, INR in the last 72 hours. ABG No results for input(s): PHART, HCO3 in the last 72 hours.  Invalid input(s): PCO2, PO2  Studies/Results: No results found.  Anti-infectives: Anti-infectives (From admission, onward)    Start     Dose/Rate Route Frequency Ordered Stop   02/01/21 0600  cefoTEtan (CEFOTAN) 2 g in sodium chloride 0.9 % 100 mL IVPB        2 g 200 mL/hr over 30 Minutes Intravenous On call to O.R. 01/31/21 1450 02/02/21 0559       Assessment/Plan: Cecal mass - for lap assisted R colectomy today. Procedure, risks, and benefits discussed and she agrees. We discussed the expected post-op course as well.   LOS: 6 days    Georganna Skeans, MD, MPH, FACS Trauma & General Surgery Use [email or web address] to contact on call provider  02/01/2021

## 2021-02-02 ENCOUNTER — Encounter (HOSPITAL_COMMUNITY): Payer: Self-pay | Admitting: General Surgery

## 2021-02-02 DIAGNOSIS — K922 Gastrointestinal hemorrhage, unspecified: Secondary | ICD-10-CM | POA: Diagnosis not present

## 2021-02-02 DIAGNOSIS — K6389 Other specified diseases of intestine: Secondary | ICD-10-CM | POA: Diagnosis not present

## 2021-02-02 DIAGNOSIS — R935 Abnormal findings on diagnostic imaging of other abdominal regions, including retroperitoneum: Secondary | ICD-10-CM | POA: Diagnosis not present

## 2021-02-02 LAB — CBC
HCT: 30.3 % — ABNORMAL LOW (ref 36.0–46.0)
Hemoglobin: 9.6 g/dL — ABNORMAL LOW (ref 12.0–15.0)
MCH: 27 pg (ref 26.0–34.0)
MCHC: 31.7 g/dL (ref 30.0–36.0)
MCV: 85.1 fL (ref 80.0–100.0)
Platelets: 125 10*3/uL — ABNORMAL LOW (ref 150–400)
RBC: 3.56 MIL/uL — ABNORMAL LOW (ref 3.87–5.11)
RDW: 24.4 % — ABNORMAL HIGH (ref 11.5–15.5)
WBC: 12.3 10*3/uL — ABNORMAL HIGH (ref 4.0–10.5)
nRBC: 3.6 % — ABNORMAL HIGH (ref 0.0–0.2)

## 2021-02-02 LAB — BASIC METABOLIC PANEL
Anion gap: 8 (ref 5–15)
BUN: 5 mg/dL — ABNORMAL LOW (ref 8–23)
CO2: 26 mmol/L (ref 22–32)
Calcium: 8.4 mg/dL — ABNORMAL LOW (ref 8.9–10.3)
Chloride: 109 mmol/L (ref 98–111)
Creatinine, Ser: 0.77 mg/dL (ref 0.44–1.00)
GFR, Estimated: >60 mL/min (ref >60)
Glucose, Bld: 116 mg/dL — ABNORMAL HIGH (ref 70–99)
Potassium: 3.3 mmol/L — ABNORMAL LOW (ref 3.5–5.1)
Sodium: 143 mmol/L (ref 135–145)

## 2021-02-02 LAB — TYPE AND SCREEN
ABO/RH(D): AB POS
Antibody Screen: NEGATIVE

## 2021-02-02 LAB — GLUCOSE, CAPILLARY
Glucose-Capillary: 101 mg/dL — ABNORMAL HIGH (ref 70–99)
Glucose-Capillary: 103 mg/dL — ABNORMAL HIGH (ref 70–99)

## 2021-02-02 LAB — MAGNESIUM: Magnesium: 1.8 mg/dL (ref 1.7–2.4)

## 2021-02-02 MED ORDER — POTASSIUM CHLORIDE 10 MEQ/100ML IV SOLN
10.0000 meq | INTRAVENOUS | Status: AC
  Administered 2021-02-02 (×4): 10 meq via INTRAVENOUS
  Filled 2021-02-02 (×4): qty 100

## 2021-02-02 MED ORDER — LACTATED RINGERS IV SOLN: INTRAVENOUS | Status: AC

## 2021-02-02 NOTE — Progress Notes (Signed)
OT Cancellation Note  Patient Details Name: Katrina Donaldson MRN: [id number] DOB: 12/17/1955   Cancelled Treatment:    Reason Eval/Treat Not Completed: Patient declined, no reason specified Attempted OT session this PM though pt declined participating while her stories were on tv. However, pt not noted to attempt locating correct channel for the stories as previously stated while OT in room. Left UE HEP and theraband on bedside table and encouraged pt to look at over the weekend. Pt noted to roll her eyes at this. Will follow up to engage pt in OOB tasks as schedule permits.   Layla Maw 02/02/2021, 12:43 PM

## 2021-02-02 NOTE — Care Management Important Message (Signed)
Important Message  Patient Details  Name: [name] MRN: [id number] Date of Birth: 20-Jul-1955   Medicare Important Message Given:  Yes     [name] 02/02/2021, 8:21 AM

## 2021-02-02 NOTE — Progress Notes (Signed)
Initial Nutrition Assessment  DOCUMENTATION CODES:   Not applicable  INTERVENTION:   If unable to advance diet in the next 48-72 hours, recommend initiation of TPN.   Once diet advanced:  Boost Breeze po TID, each supplement provides 250 kcal and 9 grams of protein MVI with minerals daily   Monitor magnesium, potassium, and phosphorus daily for at least 3 days, MD to replete as needed, as pt is at risk for refeeding syndrome.   NUTRITION DIAGNOSIS:   Increased nutrient needs related to post-op healing as evidenced by estimated needs.  GOAL:   Patient will meet greater than or equal to 90% of their needs  MONITOR:   Diet advancement, Supplement acceptance, Weight trends, PO intake, Labs, I & O's  REASON FOR ASSESSMENT:   NPO/Clear Liquid Diet    ASSESSMENT:   Patient with PMH significant for SAH, thoracic myelopathy, wheel chair dependent, bedbound, DVT, HTN, IDA, large hiatal hernia with intrathoracic stomach, and anxiety/depression. Presents this admission with GI bleed from cecal mass.  8/5- s/p laparoscopic assisted R colectomy   Awaiting bowel movement s/p surgery.   Is day 6 without adequate nutrition given she has been NPO or clear liquid diet. Attempted to obtain nutrition history from patient. Asked patient if she experienced loss in appetite PTA and patient states she has for while. She was unable to elaborate on meal composition.   Patient reports a UBW of 220 lb and is unsure of UBW. Records lack weight documentation over the last year making it difficult to quantify weight loss, if any.   UOP: 540 ml x 24 hrs   Drips: LR @ 75 ml/hr, 10 mEq Kcl x 4 runs  Labs: K 3.3 (L) CBG 87-101  NUTRITION - FOCUSED PHYSICAL EXAM:  Flowsheet Row Most Recent Value  Orbital Region No depletion  Upper Arm Region No depletion  Thoracic and Lumbar Region No depletion  Buccal Region No depletion  Temple Region No depletion  Clavicle Bone Region No depletion  Clavicle  and Acromion Bone Region No depletion  Scapular Bone Region No depletion  Dorsal Hand No depletion  Patellar Region No depletion  Anterior Thigh Region No depletion  Posterior Calf Region No depletion  Edema (RD Assessment) Mild  Hair Reviewed  Eyes Reviewed  Mouth Reviewed  Skin Reviewed  Nails Reviewed      Diet Order:   Diet Order             Diet NPO time specified Except for: Ice Chips, Sips with Meds, Other (See Comments)  Diet effective now                   EDUCATION NEEDS:   Not appropriate for education at this time  Skin:  Skin Assessment: Skin Integrity Issues: Skin Integrity Issues:: Stage II, Incisions Stage II: coccyx Incisions: abdomen  Last BM:  8/3  Height:   Ht Readings from Last 1 Encounters:  01/31/21 5' 5.5" (1.664 m)    Weight:   Wt Readings from Last 1 Encounters:  01/31/21 89.1 kg    BMI:  Body mass index is 32.19 kg/m.  Estimated Nutritional Needs:   Kcal:  1800-2000 kcal  Protein:  100-115 grams  Fluid:  >/= 2 L/day   Mariana Single MS, RD, LDN, CNSC Clinical Nutrition Pager listed in [place]

## 2021-02-02 NOTE — Progress Notes (Signed)
Patient ID: Katrina Donaldson, female   DOB: August 12, 1955, 65 y.o.   MRN: [id number] 1 Day Post-Op   Subjective: Feeling OK, no flatus ROS negative except as listed above. Objective: Vital signs in last 24 hours: Temp:  [97 F (36.1 C)-99.3 F (37.4 C)] 99.1 F (37.3 C) (08/05 0758) Pulse Rate:  [59-95] 85 (08/05 0758) Resp:  [15-18] 18 (08/05 0758) BP: (115-175)/(70-94) 154/84 (08/05 0758) SpO2:  [94 %-100 %] 94 % (08/05 0758) Last BM Date: 01/31/21  Intake/Output from previous day: 08/04 0701 - 08/05 0700 In: 1444.7 [P.O.:250; I.V.:964.7; IV Piggyback:200] Out: 690 [Urine:540; Blood:150] Intake/Output this shift: No intake/output data recorded.  GI: soft, incisions dressed  Lab Results: CBC  Recent Labs    02/01/21 0636 02/02/21 0410  WBC 8.6 12.3*  HGB 10.2* 9.6*  HCT 33.1* 30.3*  PLT 133* 125*   BMET Recent Labs    02/01/21 0636 02/02/21 0410  NA 143 143  K 3.2* 3.3*  CL 111 109  CO2 23 26  GLUCOSE 83 116*  BUN 5* 5*  CREATININE 0.65 0.77  CALCIUM 8.3* 8.4*   PT/INR No results for input(s): LABPROT, INR in the last 72 hours. ABG No results for input(s): PHART, HCO3 in the last 72 hours.  Invalid input(s): PCO2, PO2  Studies/Results:   Anti-infectives: Anti-infectives (From admission, onward)    Start     Dose/Rate Route Frequency Ordered Stop   02/01/21 0600  cefoTEtan (CEFOTAN) 2 g in sodium chloride 0.9 % 100 mL IVPB        2 g 200 mL/hr over 30 Minutes Intravenous On call to O.R. 01/31/21 1450 02/01/21 1013       Assessment/Plan: Cecal mass - S/P laparoscopic assisted R colectomy 8/5 by Dr. Grandville Silos. Sips of clears, await bowel function. CTs did not show any evidence of metastatic disease.  LOS: 7 days    Georganna Skeans, MD, MPH, FACS Trauma & General Surgery Use [email or web address] to contact on call provider  02/02/2021

## 2021-02-02 NOTE — Progress Notes (Signed)
Daily Rounding Note  02/02/2021, 11:36 AM  LOS: 7 days   SUBJECTIVE:   Chief complaint:   Dysplastic tubulovillous adenoma of cecum.  Status post right colectomy.  Blood loss anemia.  Patient doing okay.  Tolerating sips of clears.  Denies abdominal pain or nausea.  OBJECTIVE:         Vital signs in last 24 hours:    Temp:  [97.2 F (36.2 C)-99.3 F (37.4 C)] 99.1 F (37.3 C) (08/05 0758) Pulse Rate:  [81-95] 85 (08/05 0758) Resp:  [15-18] 18 (08/05 0758) BP: (115-154)/(70-94) 154/84 (08/05 0758) SpO2:  [94 %-100 %] 94 % (08/05 0758) Last BM Date: 01/31/21 Filed Weights   01/27/21 0129 01/29/21 0727 01/31/21 0913  Weight: 89.1 kg 89.1 kg 89.1 kg   General: Does not look ill. Heart: RRR Chest: No labored breathing or cough Abdomen: Large.  Bandage and rubber bracing covers the midline lower incision.  No blood evident on bandaging. Extremities: Positive lower extremity edema Neuro/Psych: Fully oriented.  Delayed but clear speech.  Intake/Output from previous day: 08/04 0701 - 08/05 0700 In: 1444.7 [P.O.:250; I.V.:964.7; IV Piggyback:200] Out: 690 [Urine:540; Blood:150]  Intake/Output this shift: No intake/output data recorded.  Lab Results: Recent Labs    01/31/21 0115 02/01/21 0636 02/02/21 0410  WBC 7.7 8.6 12.3*  HGB 11.2* 10.2* 9.6*  HCT 35.5* 33.1* 30.3*  PLT 151 133* 125*   BMET Recent Labs    02/01/21 0636 02/02/21 0410  NA 143 143  K 3.2* 3.3*  CL 111 109  CO2 23 26  GLUCOSE 83 116*  BUN 5* 5*  CREATININE 0.65 0.77  CALCIUM 8.3* 8.4*   LFT No results for input(s): PROT, ALBUMIN, AST, ALT, ALKPHOS, BILITOT, BILIDIR, IBILI in the last 72 hours. PT/INR No results for input(s): LABPROT, INR in the last 72 hours. Hepatitis Panel No results for input(s): HEPBSAG, HCVAB, HEPAIGM, HEPBIGM in the last 72 hours.  Studies/Results: CT CHEST ABDOMEN PELVIS W CONTRAST  Result Date:  02/01/2021 CLINICAL DATA:  65 year old female with history of large cecal mass scheduled for right colectomy. Abdominal pain. EXAM: CT CHEST, ABDOMEN, AND PELVIS WITH CONTRAST TECHNIQUE: Multidetector CT imaging of the chest, abdomen and pelvis was performed following the standard protocol during bolus administration of intravenous contrast. CONTRAST:  137m OMNIPAQUE IOHEXOL 300 MG/ML  SOLN COMPARISON:  CTA of the abdomen and pelvis 01/27/2021. FINDINGS: CT CHEST FINDINGS Cardiovascular: Heart size is normal. There is no significant pericardial fluid, thickening or pericardial calcification. There is aortic atherosclerosis, as well as atherosclerosis of the great vessels of the mediastinum and the coronary arteries, including calcified atherosclerotic plaque in the left anterior descending coronary artery. Mediastinum/Nodes: No pathologically enlarged mediastinal or hilar lymph nodes. Large hiatal hernia. No axillary lymphadenopathy. Lungs/Pleura: Marked elevation of the left hemidiaphragm with extensive passive atelectasis in the left lower lobe. No acute consolidative airspace disease. No pleural effusions. No suspicious appearing pulmonary nodules or masses are noted. Musculoskeletal: There are no aggressive appearing lytic or blastic lesions noted in the visualized portions of the skeleton. CT ABDOMEN PELVIS FINDINGS Hepatobiliary: No suspicious cystic or solid hepatic lesions. No intra or extrahepatic biliary ductal dilatation. Some amorphous high attenuation material lies dependently in the lumen of the gallbladder, compatible with biliary sludge and/or vicarious excretion of contrast material related to prior contrast enhanced CT exam 01/27/2021. Gallbladder is not distended. No pericholecystic fluid or surrounding inflammatory changes. Pancreas: No pancreatic mass. No pancreatic ductal  dilatation. No pancreatic or peripancreatic fluid collections or inflammatory changes. Spleen: Unremarkable.  Adrenals/Urinary Tract: Tiny 2-3 mm nonobstructive calculi are noted within the lower pole collecting system of left kidney. Right kidney and bilateral adrenal glands are normal in appearance. No hydroureteronephrosis. Urinary bladder is normal in appearance. Stomach/Bowel: Stomach is nearly completely intrathoracic within the patient's large hiatal hernia. No pathologic dilatation of small bowel or colon. Soft tissue mass in the region of the cecum (axial image 69 of series 3 and coronal image 61 of series 6) measuring 3.6 x 4.1 x 4.2 cm. Normal appendix. Vascular/Lymphatic: Aortic atherosclerosis, without evidence of aneurysm or dissection in the abdominal or pelvic vasculature. No lymphadenopathy noted in the abdomen or pelvis. Reproductive: Large densely calcified lesion extending off the left side of the uterine fundus, compatible with a large calcified subserosal fibroid. Ovaries are atrophic. Other: No significant volume of ascites. No pneumoperitoneum. Chronic presacral edema, similar to the prior study. Musculoskeletal: There are no aggressive appearing lytic or blastic lesions noted in the visualized portions of the skeleton. IMPRESSION: 1. Soft tissue mass in the region of the cecum estimated measure 3.6 x 4.1 x 4.2 cm. No definite metastatic disease confidently identified in the chest, abdomen or pelvis. 2. Severe elevation of the left hemidiaphragm. Large hiatal hernia with intrathoracic stomach. 3. Probable chronic passive atelectasis in the left lower lobe related to the elevated left hemidiaphragm. 4. Two tiny 2-3 mm nonobstructive calculi in the lower pole collecting system of left kidney. 5. Aortic atherosclerosis, in addition to left anterior descending coronary artery disease. Please note that although the presence of coronary artery calcium documents the presence of coronary artery disease, the severity of this disease and any potential stenosis cannot be assessed on this non-gated CT  examination. Assessment for potential risk factor modification, dietary therapy or pharmacologic therapy may be warranted, if clinically indicated. 6. Additional incidental findings, as above. Electronically Signed   By: Vinnie Langton M.D.   On: 02/01/2021 10:57    Scheduled Meds:  Chlorhexidine Gluconate Cloth  6 each Topical Daily   famotidine  20 mg Oral Daily   hydrocortisone cream   Topical QID   hydrOXYzine  25 mg Oral TID   levothyroxine  125 mcg Oral QAC breakfast   metoprolol tartrate  25 mg Oral BID   pantoprazole (PROTONIX) IV  40 mg Intravenous Q12H   PARoxetine  20 mg Oral Daily   Continuous Infusions:  lactated ringers 10 mL/hr at 02/01/21 1512   potassium chloride 10 mEq (02/02/21 1105)   PRN Meds:.acetaminophen, baclofen, chlorhexidine, fluticasone, hydrALAZINE, menthol-cetylpyridinium, metoprolol tartrate, ondansetron (ZOFRAN) IV, polyethylene glycol, polyvinyl alcohol, senna-docusate  ASSESMENT:   Acute on chronic anemia.  Hgb 3 at admission.  S/p 4 PRBCs.  Hgb up to 11.7, 9.6 today.  Maroon, heme positive stools.  CTAP with angio shows presacral inflammation, posterior rectal wall thickening, large HH splenic flexure intestine. 01/29/2021 EGD.  Tortuous esophagus.  8 cm hiatal hernia without Cameron's lesions.  Normal gastric and examined duodenal mucosa.  No source for bleed or IDA. 01/31/2021 colonoscopy.  Large mass in cecum  is likely source of IDA.  Biopsy confirmed TVA with focal HGD.  This is not resectable via endoscopy.  Nonbleeding right sided ascending diverticulosis.   CEA 1.2. 02/01/2021 CT chest, abdomen pelvis.  Demonstrates a large cecal mass.  Large hiatal hernia (almost all of the stomach is intrathoracic injury hiatal hernia ) with associated severe left hemidiaphragm elevation and associated atelectasis.  No  evidence for metastasis. 02/01/2021 laparoscopic-assisted right colectomy.  Chronic Xarelto, on hold.  History of DVT.  Brain aneurysm, [facility] 2016.   Stroke.  Wheelchair-bound at baseline.  Thrombocytopenia.  Platelets 125K.   PLAN   GI available if needed but will sign off..  Please call prn.    Azucena Freed  02/02/2021, 11:36 AM Phone [phone]

## 2021-02-02 NOTE — Progress Notes (Signed)
"Admitted PROGRESS NOTE    Katrina Donaldson  [id number] DOB: 1956-03-02 DOA: 01/26/2021 PCP: Donald Prose, MD   Brief Narrative:  68 Past medical history of SAH, thoracic myelopathy, wheelchair dependent, bedbound, history of DVT on Xarelto, HTN, IDA, hypothyroidism, anxiety, depression.  Presents with complaints of abnormal lab with hemoglobin of 3.0. Suspect upper GI bleed.  GI consulted.  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  General surgery was consulted for resection.  Patient underwent laparoscopic colectomy on 8/4 by surgery.  Acute tubulovillous Oliclinomel with high-grade dysplasia.   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  Acute proctitis -Admission hemoglobin 3.0.  Hemoccult positive.  Status post 4 unit PRBC transfusion. Awaiting bowel func to return.  - PPI twice daily.  Xarelto on hold - CTA-negative for acute bleeding - Colonoscopy-showed cecal mass.  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  Management per surgery if surgery is indicated  Maculopapular rash; allergic dermatitis-improved -Torso and upper extremity.  On Pepcid and topical hydrocortisone.  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   Medial coccyx stage II pressure ulcer; POA Continue home dressing.   Clinical dehydration, will give IVF x 24 hrs    DVT prophylaxis: Place and maintain sequential compression device Start: 01/31/21 1217  SCDs Code Status: Full Family Communication:   Status is: Inpatient  Remains inpatient appropriate because:Inpatient level of care appropriate due to severity of illness  Dispo:  Patient From: [facility]  Planned Disposition: [facility].  Awaiting return of bowel function and clearance by general surgery.  Medically stable for discharge: No         Nutritional status           Body mass index is 32.19 kg/m.  Pressure Injury 01/27/21 Coccyx Medial Stage 2 -  Partial thickness loss of dermis presenting as a shallow open injury with a red, pink wound bed without slough. (Active)  01/27/21 0311  Location: Coccyx  Location Orientation: Medial  Staging: Stage 2 -  Partial thickness loss of dermis presenting as a shallow open injury with a red, pink wound bed without slough.  Wound Description (Comments):   Present on Admission:           Subjective: Feels ok. Still no gas.   Review of Systems Otherwise negative except as per HPI, including: General = no fevers, chills, dizziness,  fatigue HEENT/EYES = negative for loss of vision, double vision, blurred vision,  sore throa Cardiovascular= negative for chest pain, palpitation Respiratory/lungs= negative for shortness of breath, cough, wheezing; hemoptysis,  Gastrointestinal= negative for nausea, vomiting, abdominal pain Genitourinary= negative for Dysuria MSK = Negative for arthralgia, myalgias Neurology= Negative for headache, numbness, tingling  Psychiatry= Negative for suicidal and homocidal ideation Skin= Negative for Rash   Examination: Constitutional: Not in acute  distress Respiratory: Clear to auscultation bilaterally Cardiovascular: Normal sinus rhythm, no rubs Abdomen: Nontender nondistended + bowel sounds Musculoskeletal: No edema noted Skin: No rashes seen Neurologic: CN 2-12 grossly intact.  And nonfocal Psychiatric: Normal judgment and insight. Alert and oriented x 3. Normal mood.    Surgical site looks ok.     Objective: Vitals:   02/01/21 1600 02/01/21 2014 02/01/21 2354 02/02/21 0414  BP: (!) 141/86 134/85 115/70 (!) 143/85  Pulse: 86 95 95 86  Resp: '18 17 18 17  '$ Temp: 98.7 F (37.1 C) 99.3 F (37.4 C) 98.8 F (37.1 C) 99.1 F (37.3 C)  TempSrc: Oral Oral Oral Oral  SpO2: 96% 95% 96% 95%  Weight:      Height:        Intake/Output Summary (Last 24 hours) at 02/02/2021 0756 Last data filed at 02/02/2021 0419 Gross per 24 hour  Intake 1444.67 ml  Output 690 ml  Net 754.67 ml   Filed Weights   01/27/21 0129 01/29/21 0727 01/31/21 0913  Weight: 89.1 kg 89.1 kg 89.1 kg     Data Reviewed:   CBC: Recent Labs  Lab 01/26/21 2045 01/27/21 1812 01/29/21 0442 01/30/21 0217 01/31/21 0115 02/01/21 0636 02/02/21 0410  WBC 7.3   < > 7.1 7.2 7.7 8.6 12.3*  NEUTROABS 4.4  --  5.1 6.3 7.0  --   --   HGB 3.0*   < > 10.5* 10.4* 11.2* 10.2* 9.6*  HCT 11.6*   < > 32.5* 32.6* 35.5* 33.1* 30.3*  MCV 69.5*   < > 82.1 83.0 83.5 85.8 85.1  PLT 275   < > 155 153 151 133* 125*   < > = values in this interval not displayed.   Basic Metabolic Panel: Recent Labs  Lab 01/27/21 1812 01/28/21 0445 01/29/21 0442 02/01/21 0636 02/02/21 0410  NA 139 140 142 143 143  K 3.7 3.6 3.3* 3.2* 3.3*  CL 107 106 110 111 109  CO2 '23 25 25 23 26  '$ GLUCOSE 96 97 93 83 116*  BUN 6* <5* 5* 5* 5*  CREATININE 0.56 0.56 0.57 0.65 0.77  CALCIUM 8.2* 8.1* 8.1* 8.3* 8.4*  MG 1.9  --   --  1.9 1.8   GFR: Estimated Creatinine Clearance: 76.1 mL/min (by C-G formula based on SCr of 0.77 mg/dL). Liver Function Tests: Recent Labs  Lab 01/26/21 2045   AST 13*  ALT 9  ALKPHOS 77  BILITOT 0.3  PROT 7.3  ALBUMIN 3.0*   No results for input(s): LIPASE, AMYLASE in the last 168 hours. No results for input(s): AMMONIA in the last 168 hours. Coagulation Profile: No results for input(s): INR, PROTIME in the last 168 hours. Cardiac Enzymes: No results for input(s): CKTOTAL, CKMB, CKMBINDEX, TROPONINI in the last 168 hours. BNP (last 3 results) No results for input(s): PROBNP in the last 8760 hours. HbA1C: No results for input(s): HGBA1C in the last 72 hours. CBG: Recent Labs  Lab 02/01/21 0621  GLUCAP 87   Lipid Profile: No results for input(s): CHOL, HDL, LDLCALC, TRIG, CHOLHDL, LDLDIRECT in the last 72 hours. Thyroid Function Tests: No results for input(s): TSH, T4TOTAL, FREET4, T3FREE, THYROIDAB in the last 72 hours. Anemia Panel: No results for input(s): VITAMINB12, FOLATE, FERRITIN, TIBC, IRON, RETICCTPCT in the last 72 hours. Sepsis Labs: No results for input(s): PROCALCITON, LATICACIDVEN in the last 168 hours.  Recent Results (from the past 240 hour(s))  SARS CORONAVIRUS 2 (TAT 6-24 HRS) Nasopharyngeal Nasopharyngeal Swab     Status: None   Collection Time: 01/26/21 11:00 PM   Specimen: Nasopharyngeal Swab  Result Value Ref Range Status   SARS Coronavirus 2 NEGATIVE NEGATIVE Final    Comment: (NOTE) SARS-CoV-2 target nucleic acids are NOT DETECTED.  The SARS-CoV-2 RNA is generally detectable in upper and lower respiratory specimens during the acute phase of infection. Negative results do not preclude SARS-CoV-2 infection, do not rule out co-infections with other pathogens, and should not be used as the sole basis for treatment or other patient management decisions. Negative results must be combined with clinical observations, patient history, and epidemiological information. The expected result is Negative.  Fact Sheet for Patients: SugarRoll.be  Fact Sheet for Healthcare  Providers: https://www.woods-mathews.com/  This test is not yet approved or cleared by the [place] FDA and  has been authorized for detection and/or diagnosis of SARS-CoV-2 by FDA under an Emergency Use Authorization (EUA). This EUA will remain  in effect (meaning this test can be used) for the duration of the COVID-19 declaration under Se ction 564(b)(1) of the Act, 21 U.S.C. section 360bbb-3(b)(1), unless the authorization is terminated or revoked sooner.  Performed at [facility], [place] [address], [place], [place] [address]   Surgical pcr screen     Status: None   Collection Time: 01/31/21  9:19 PM   Specimen: Nasal Mucosa; Nasal Swab  Result Value Ref Range Status   MRSA, PCR NEGATIVE NEGATIVE Final   Staphylococcus aureus NEGATIVE NEGATIVE Final    Comment: (NOTE) The Xpert SA Assay (FDA approved for NASAL specimens in patients 24 years of age and older), is one component of a comprehensive surveillance program. It is not intended to diagnose infection nor to guide or monitor treatment. Performed at [facility], [place] [address], [place], [place] [address]          Radiology Studies: CT CHEST ABDOMEN PELVIS W CONTRAST  Result Date: 02/01/2021 CLINICAL DATA:  65 year old female with history of large cecal mass scheduled for right colectomy. Abdominal pain. EXAM: CT CHEST, ABDOMEN, AND PELVIS WITH CONTRAST TECHNIQUE: Multidetector CT imaging of the chest, abdomen and pelvis was performed following the standard protocol during bolus administration of intravenous contrast. CONTRAST:  148m OMNIPAQUE IOHEXOL 300 MG/ML  SOLN COMPARISON:  CTA of the abdomen and pelvis 01/27/2021. FINDINGS: CT CHEST FINDINGS Cardiovascular: Heart size is normal. There is no significant pericardial fluid, thickening or pericardial calcification. There is aortic atherosclerosis, as well as atherosclerosis of the great vessels of the mediastinum and the coronary arteries,  including calcified atherosclerotic plaque in the left anterior descending coronary artery. Mediastinum/Nodes: No pathologically enlarged mediastinal or hilar lymph nodes. Large hiatal hernia. No axillary lymphadenopathy. Lungs/Pleura: Marked elevation of the left hemidiaphragm with extensive passive atelectasis in the left lower lobe. No acute consolidative airspace disease. No pleural effusions. No suspicious appearing pulmonary nodules or masses are noted. Musculoskeletal: There are no aggressive appearing lytic or blastic lesions noted in the visualized portions of the skeleton. CT ABDOMEN PELVIS FINDINGS Hepatobiliary: No suspicious cystic or solid hepatic lesions. No intra or extrahepatic biliary ductal dilatation. Some amorphous high attenuation material lies dependently in the lumen of the gallbladder, compatible with biliary sludge and/or vicarious excretion of contrast material related to prior contrast enhanced CT exam 01/27/2021. Gallbladder is not distended. No pericholecystic fluid or surrounding inflammatory changes. Pancreas: No pancreatic mass. No pancreatic  ductal dilatation. No pancreatic or peripancreatic fluid collections or inflammatory changes. Spleen: Unremarkable. Adrenals/Urinary Tract: Tiny 2-3 mm nonobstructive calculi are noted within the lower pole collecting system of left kidney. Right kidney and bilateral adrenal glands are normal in appearance. No hydroureteronephrosis. Urinary bladder is normal in appearance. Stomach/Bowel: Stomach is nearly completely intrathoracic within the patient's large hiatal hernia. No pathologic dilatation of small bowel or colon. Soft tissue mass in the region of the cecum (axial image 69 of series 3 and coronal image 61 of series 6) measuring 3.6 x 4.1 x 4.2 cm. Normal appendix. Vascular/Lymphatic: Aortic atherosclerosis, without evidence of aneurysm or dissection in the abdominal or pelvic vasculature. No lymphadenopathy noted in the abdomen or pelvis.  Reproductive: Large densely calcified lesion extending off the left side of the uterine fundus, compatible with a large calcified subserosal fibroid. Ovaries are atrophic. Other: No significant volume of ascites. No pneumoperitoneum. Chronic presacral edema, similar to the prior study. Musculoskeletal: There are no aggressive appearing lytic or blastic lesions noted in the visualized portions of the skeleton. IMPRESSION: 1. Soft tissue mass in the region of the cecum estimated measure 3.6 x 4.1 x 4.2 cm. No definite metastatic disease confidently identified in the chest, abdomen or pelvis. 2. Severe elevation of the left hemidiaphragm. Large hiatal hernia with intrathoracic stomach. 3. Probable chronic passive atelectasis in the left lower lobe related to the elevated left hemidiaphragm. 4. Two tiny 2-3 mm nonobstructive calculi in the lower pole collecting system of left kidney. 5. Aortic atherosclerosis, in addition to left anterior descending coronary artery disease. Please note that although the presence of coronary artery calcium documents the presence of coronary artery disease, the severity of this disease and any potential stenosis cannot be assessed on this non-gated CT examination. Assessment for potential risk factor modification, dietary therapy or pharmacologic therapy may be warranted, if clinically indicated. 6. Additional incidental findings, as above. Electronically Signed   By: Vinnie Langton M.D.   On: 02/01/2021 10:57        Scheduled Meds:  Chlorhexidine Gluconate Cloth  6 each Topical Daily   famotidine  20 mg Oral Daily   hydrocortisone cream   Topical QID   hydrOXYzine  25 mg Oral TID   levothyroxine  125 mcg Oral QAC breakfast   metoprolol tartrate  25 mg Oral BID   pantoprazole (PROTONIX) IV  40 mg Intravenous Q12H   PARoxetine  20 mg Oral Daily   Continuous Infusions:  ferric gluconate (FERRLECIT) IVPB 250 mg (01/31/21 1202)   lactated ringers 10 mL/hr at 02/01/21 1512      LOS: 7 days   Time spent= 35 mins    Raneem Mendolia Arsenio Loader, MD [facility]  If 7PM-7AM, please contact night-coverage  02/02/2021, 7:56 AM

## 2021-02-03 DIAGNOSIS — K6389 Other specified diseases of intestine: Secondary | ICD-10-CM | POA: Diagnosis not present

## 2021-02-03 DIAGNOSIS — K922 Gastrointestinal hemorrhage, unspecified: Secondary | ICD-10-CM | POA: Diagnosis not present

## 2021-02-03 DIAGNOSIS — R935 Abnormal findings on diagnostic imaging of other abdominal regions, including retroperitoneum: Secondary | ICD-10-CM | POA: Diagnosis not present

## 2021-02-03 LAB — BASIC METABOLIC PANEL
Anion gap: 6 (ref 5–15)
BUN: 5 mg/dL — ABNORMAL LOW (ref 8–23)
CO2: 27 mmol/L (ref 22–32)
Calcium: 8.1 mg/dL — ABNORMAL LOW (ref 8.9–10.3)
Chloride: 108 mmol/L (ref 98–111)
Creatinine, Ser: 0.61 mg/dL (ref 0.44–1.00)
GFR, Estimated: >60 mL/min (ref >60)
Glucose, Bld: 93 mg/dL (ref 70–99)
Potassium: 3 mmol/L — ABNORMAL LOW (ref 3.5–5.1)
Sodium: 141 mmol/L (ref 135–145)

## 2021-02-03 LAB — GLUCOSE, CAPILLARY
Glucose-Capillary: 101 mg/dL — ABNORMAL HIGH (ref 70–99)
Glucose-Capillary: 101 mg/dL — ABNORMAL HIGH (ref 70–99)
Glucose-Capillary: 88 mg/dL (ref 70–99)
Glucose-Capillary: 94 mg/dL (ref 70–99)

## 2021-02-03 LAB — CBC
HCT: 29.3 % — ABNORMAL LOW (ref 36.0–46.0)
Hemoglobin: 9.1 g/dL — ABNORMAL LOW (ref 12.0–15.0)
MCH: 26.8 pg (ref 26.0–34.0)
MCHC: 31.1 g/dL (ref 30.0–36.0)
MCV: 86.2 fL (ref 80.0–100.0)
Platelets: 113 10*3/uL — ABNORMAL LOW (ref 150–400)
RBC: 3.4 MIL/uL — ABNORMAL LOW (ref 3.87–5.11)
RDW: 25.2 % — ABNORMAL HIGH (ref 11.5–15.5)
WBC: 9.3 10*3/uL (ref 4.0–10.5)
nRBC: 1.9 % — ABNORMAL HIGH (ref 0.0–0.2)

## 2021-02-03 LAB — MAGNESIUM: Magnesium: 1.6 mg/dL — ABNORMAL LOW (ref 1.7–2.4)

## 2021-02-03 MED ORDER — ENOXAPARIN SODIUM 40 MG/0.4ML IJ SOSY
40.0000 mg | PREFILLED_SYRINGE | INTRAMUSCULAR | Status: DC
  Administered 2021-02-03 – 2021-02-04 (×2): 40 mg via SUBCUTANEOUS
  Filled 2021-02-03 (×2): qty 0.4

## 2021-02-03 MED ORDER — POTASSIUM CHLORIDE 10 MEQ/100ML IV SOLN
10.0000 meq | INTRAVENOUS | Status: AC
  Administered 2021-02-03 (×6): 10 meq via INTRAVENOUS
  Filled 2021-02-03 (×7): qty 100

## 2021-02-03 MED ORDER — MAGNESIUM SULFATE 4 GM/100ML IV SOLN
4.0000 g | Freq: Once | INTRAVENOUS | Status: AC
  Administered 2021-02-03: 4 g via INTRAVENOUS
  Filled 2021-02-03 (×2): qty 100

## 2021-02-03 NOTE — Progress Notes (Signed)
Foley catheter removed per order by general surgery. Patient tolerated procedure well.  Will monitor urinary output.

## 2021-02-03 NOTE — Progress Notes (Addendum)
Patient ID: Katrina Donaldson, female   DOB: 08-04-1955, 65 y.o.   MRN: [id number] 2 Days Post-Op   Subjective: No new complaints.  Some flatus and lots of abdominal sounds.  No nausea with sips of clears  ROS negative except as listed above. Objective: Vital signs in last 24 hours: Temp:  [98.7 F (37.1 C)-99.6 F (37.6 C)] 98.7 F (37.1 C) (08/06 0418) Pulse Rate:  [82-92] 85 (08/06 0418) Resp:  [17-18] 18 (08/06 0418) BP: (126-171)/(77-95) 126/87 (08/06 0418) SpO2:  [93 %-96 %] 96 % (08/06 0418) Last BM Date: 01/31/21  Intake/Output from previous day: 08/05 0701 - 08/06 0700 In: 1886.1 [P.O.:50; I.V.:1436.1; IV Piggyback:400] Out: 1250 [Urine:1250] Intake/Output this shift: Total I/O In: -  Out: 75 [Urine:75]  PE: Abd: soft, appropriately tender, great BS, midline incision is c/d/I with a staples  Lab Results: CBC  Recent Labs    02/02/21 0410 02/03/21 0505  WBC 12.3* 9.3  HGB 9.6* 9.1*  HCT 30.3* 29.3*  PLT 125* 113*   BMET Recent Labs    02/02/21 0410 02/03/21 0505  NA 143 141  K 3.3* 3.0*  CL 109 108  CO2 26 27  GLUCOSE 116* 93  BUN 5* 5*  CREATININE 0.77 0.61  CALCIUM 8.4* 8.1*   PT/INR No results for input(s): LABPROT, INR in the last 72 hours. ABG No results for input(s): PHART, HCO3 in the last 72 hours.  Invalid input(s): PCO2, PO2  Studies/Results:   Anti-infectives: Anti-infectives (From admission, onward)    Start     Dose/Rate Route Frequency Ordered Stop   02/01/21 0600  cefoTEtan (CEFOTAN) 2 g in sodium chloride 0.9 % 100 mL IVPB        2 g 200 mL/hr over 30 Minutes Intravenous On call to O.R. 01/31/21 1450 02/01/21 1013       Assessment/Plan: POD 2, S/P laparoscopic assisted R colectomy 8/5 by Dr. Grandville Silos for Cecal mass -path pending -patient feels well with no nausea, + flatus and has great BS.  Adv to FLD -wheelchair bound, but mobilize as able to chair etc with therapies -ok to DC foley from our standpoint -incentive  spirometer -Discussed patient with primary  FEN - FLD/IVFs per med VTE - SCDs, Lovenox 40 mg today, if hgb stable can give full dose tomorrow ID - none  H/O DVT - xarelto on hold HTN Hypothyroidism Wheelchair bound  LOS: 8 days   Henreitta Cea, PA-C Tra[facility] General Surgery Use [email or web address] to contact on call provider  02/03/2021

## 2021-02-03 NOTE — Progress Notes (Addendum)
"Admitted PROGRESS NOTE    Katrina Donaldson  [id number] DOB: 1955-10-03 DOA: 01/26/2021 PCP: Donald Prose, MD   Brief Narrative:  75 Past medical history of SAH, thoracic myelopathy, wheelchair dependent, bedbound, history of DVT on Xarelto, HTN, IDA, hypothyroidism, anxiety, depression.  Presents with complaints of abnormal lab with hemoglobin of 3.0. Suspect upper GI bleed.  GI consulted.  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  General surgery was consulted for resection.  Patient underwent laparoscopic colectomy on 8/4 by surgery. C scope path- Acute tubulovillous adenoma with high-grade dysplasia.  Awaiting bowel function to return.   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  Acute proctitis -Admission hemoglobin 3.0.  Hemoccult positive.  Status post 4 unit PRBC transfusion.  Advance to full liquid - PPI twice daily.  Xarelto on hold, tentative plans to resume tomorrow - CTA-negative for acute bleeding - Colonoscopy-showed cecal mass.  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 Surgical path- pending.  - CEA-1.2 -CT chest abdomen pelvis-cecal mass, large hiatal hernia, chronic passive atelectasis, nonobstructing renal stone. No signs of metastatic disease  Large hiatal hernia with intrathoracic stomach, severe left hemidiaphragm - PPI.  Management per surgery if surgery is indicated  Maculopapular rash; allergic dermatitis-improved -Torso and upper extremity.  On Pepcid and topical hydrocortisone.  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   Medial coccyx stage II pressure ulcer; POA Continue home dressing.   Remove Foley   DVT prophylaxis: Subcu Lovenox Code Status: Full Family Communication:   Status is: Inpatient  Remains inpatient appropriate because:Inpatient level of care appropriate due to severity of illness  Dispo:  Patient From: [place]  Planned Disposition: [facility].  Awaiting return of bowel function and clearance by general surgery.  Medically stable for discharge: No         Nutritional status  Nutrition Problem: Increased nutrient needs Etiology: post-op healing  Signs/Symptoms: estimated needs  Interventions: Refer to RD note for recommendations  Body mass index is 32.19 kg/m.  Pressure Injury 01/27/21 Coccyx Medial Stage 2 -  Partial thickness loss of dermis presenting as a shallow open injury with a red, pink wound bed without slough. (Active)  01/27/21 0311  Location: Coccyx  Location Orientation: Medial  Staging: Stage 2 -  Partial thickness loss of dermis presenting as a shallow open injury with a red, pink wound bed without slough.  Wound Description (Comments):   Present on Admission:           Subjective: Patient was able to pass some gas overnight, bowel sounds are improving.  Denies any nausea, vomiting.  Review of Systems Otherwise negative except as per HPI, including: General: Denies fever, chills, night sweats or unintended weight loss. Resp: Denies cough, wheezing, shortness of breath. Cardiac: Denies chest pain, palpitations, orthopnea, paroxysmal nocturnal dyspnea. GI: Denies abdominal  pain, nausea, vomiting, diarrhea or constipation GU: Denies dysuria, frequency,  hesitancy or incontinence MS: Denies muscle aches, joint pain or swelling Neuro: Denies headache, neurologic deficits (focal weakness, numbness, tingling), abnormal gait Psych: Denies anxiety, depression, SI/HI/AVH Skin: Denies new rashes or lesions ID: Denies sick contacts, exotic exposures, travel   Examination: Constitutional: Not in acute distress Respiratory: Clear to auscultation bilaterally Cardiovascular: Normal sinus rhythm, no rubs Abdomen: Nontender nondistended good bowel sounds Musculoskeletal: No edema noted Skin: No rashes seen Neurologic: CN 2-12 grossly intact.  And nonfocal Psychiatric: Normal judgment and insight. Alert and oriented x 3. Normal mood.  Surgical site looks ok.     Objective: Vitals:   02/02/21 1654 02/02/21 1955 02/02/21 2325 02/03/21 0418  BP: (!) 171/87 (!) 170/94 (!) 163/95 126/87  Pulse: 85 87 92 85  Resp: '17 18 18 18  '$ Temp: 99.4 F (37.4 C) 99 F (37.2 C) 99.3 F (37.4 C) 98.7 F (37.1 C)  TempSrc: Oral Oral Oral Oral  SpO2: 93% 93% 96% 96%  Weight:      Height:        Intake/Output Summary (Last 24 hours) at 02/03/2021 0816 Last data filed at 02/03/2021 [id number] Gross per 24 hour  Intake 1886.11 ml  Output 1250 ml  Net 636.11 ml   Filed Weights   01/27/21 0129 01/29/21 0727 01/31/21 0913  Weight: 89.1 kg 89.1 kg 89.1 kg     Data Reviewed:   CBC: Recent Labs  Lab 01/29/21 0442 01/30/21 0217 01/31/21 0115 02/01/21 0636 02/02/21 0410  WBC 7.1 7.2 7.7 8.6 12.3*  NEUTROABS 5.1 6.3 7.0  --   --   HGB 10.5* 10.4* 11.2* 10.2* 9.6*  HCT 32.5* 32.6* 35.5* 33.1* 30.3*  MCV 82.1 83.0 83.5 85.8 85.1  PLT 155 153 151 133* 0000000*   Basic Metabolic Panel: Recent Labs  Lab 01/27/21 1812 01/28/21 0445 01/29/21 0442 02/01/21 0636 02/02/21 0410 02/03/21 0505  NA 139 140 142 143 143 141  K 3.7 3.6 3.3* 3.2* 3.3* 3.0*  CL 107 106 110 111 109 108  CO2 '23 25 25 23 26 27  '$ GLUCOSE 96 97 93 83 116* 93  BUN 6* <5* 5* 5* 5* 5*   CREATININE 0.56 0.56 0.57 0.65 0.77 0.61  CALCIUM 8.2* 8.1* 8.1* 8.3* 8.4* 8.1*  MG 1.9  --   --  1.9 1.8 1.6*   GFR: Estimated Creatinine Clearance: 76.1 mL/min (by C-G formula based on SCr of 0.61 mg/dL). Liver Function Tests: No results for input(s): AST, ALT, ALKPHOS, BILITOT, PROT, ALBUMIN in the last 168 hours.  No results for input(s): LIPASE, AMYLASE in the last 168 hours. No results for input(s): AMMONIA in the last 168 hours. Coagulation Profile: No results for input(s): INR, PROTIME in the last 168 hours. Cardiac Enzymes: No results for input(s): CKTOTAL, CKMB, CKMBINDEX, TROPONINI in the last 168 hours. BNP (last 3 results) No results for input(s): PROBNP in the last 8760 hours. HbA1C: No results for input(s): HGBA1C in the last 72 hours. CBG: Recent Labs  Lab 02/01/21 0621 02/02/21 1151 02/02/21 1708 02/03/21 0630  GLUCAP 87 101* 103* 94   Lipid Profile: No results for input(s): CHOL, HDL, LDLCALC, TRIG, CHOLHDL, LDLDIRECT in the last 72 hours. Thyroid Function Tests: No results for input(s): TSH, T4TOTAL, FREET4, T3FREE, THYROIDAB in the last 72 hours. Anemia Panel: No results for input(s): VITAMINB12, FOLATE, FERRITIN, TIBC, IRON, RETICCTPCT in the last 72 hours. Sepsis Labs: No results for input(s): PROCALCITON, LATICACIDVEN in the  last 168 hours.  Recent Results (from the past 240 hour(s))  SARS CORONAVIRUS 2 (TAT 6-24 HRS) Nasopharyngeal Nasopharyngeal Swab     Status: None   Collection Time: 01/26/21 11:00 PM   Specimen: Nasopharyngeal Swab  Result Value Ref Range Status   SARS Coronavirus 2 NEGATIVE NEGATIVE Final    Comment: (NOTE) SARS-CoV-2 target nucleic acids are NOT DETECTED.  The SARS-CoV-2 RNA is generally detectable in upper and lower respiratory specimens during the acute phase of infection. Negative results do not preclude SARS-CoV-2 infection, do not rule out co-infections with other pathogens, and should not be used as the sole  basis for treatment or other patient management decisions. Negative results must be combined with clinical observations, patient history, and epidemiological information. The expected result is Negative.  Fact Sheet for Patients: SugarRoll.be  Fact Sheet for Healthcare Providers: https://www.woods-mathews.com/  This test is not yet approved or cleared by the [place] FDA and  has been authorized for detection and/or diagnosis of SARS-CoV-2 by FDA under an Emergency Use Authorization (EUA). This EUA will remain  in effect (meaning this test can be used) for the duration of the COVID-19 declaration under Se ction 564(b)(1) of the Act, 21 U.S.C. section 360bbb-3(b)(1), unless the authorization is terminated or revoked sooner.  Performed at [facility], [place] [address] [place], [place] [address]   Surgical pcr screen     Status: None   Collection Time: 01/31/21  9:19 PM   Specimen: Nasal Mucosa; Nasal Swab  Result Value Ref Range Status   MRSA, PCR NEGATIVE NEGATIVE Final   Staphylococcus aureus NEGATIVE NEGATIVE Final    Comment: (NOTE) The Xpert SA Assay (FDA approved for NASAL specimens in patients 69 years of age and older), is one component of a comprehensive surveillance program. It is not intended to diagnose infection nor to guide or monitor treatment. Performed at [facility], [place] [address], [place], [place] [address]          Radiology Studies: No results found.      Scheduled Meds:  Chlorhexidine Gluconate Cloth  6 each Topical Daily   famotidine  20 mg Oral Daily   hydrocortisone cream   Topical QID   hydrOXYzine  25 mg Oral TID   levothyroxine  125 mcg Oral QAC breakfast   metoprolol tartrate  25 mg Oral BID   pantoprazole (PROTONIX) IV  40 mg Intravenous Q12H   PARoxetine  20 mg Oral Daily   Continuous Infusions:  lactated ringers 10 mL/hr at 02/01/21 1512   lactated ringers 75 mL/hr at  02/03/21 0416   magnesium sulfate bolus IVPB     potassium chloride       LOS: 8 days   Time spent= 35 mins    Adaria Hole Arsenio Loader, MD [facility]  If 7PM-7AM, please contact night-coverage  02/03/2021, 8:16 AM

## 2021-02-04 DIAGNOSIS — R935 Abnormal findings on diagnostic imaging of other abdominal regions, including retroperitoneum: Secondary | ICD-10-CM | POA: Diagnosis not present

## 2021-02-04 DIAGNOSIS — K922 Gastrointestinal hemorrhage, unspecified: Secondary | ICD-10-CM | POA: Diagnosis not present

## 2021-02-04 DIAGNOSIS — K6389 Other specified diseases of intestine: Secondary | ICD-10-CM | POA: Diagnosis not present

## 2021-02-04 LAB — BASIC METABOLIC PANEL
Anion gap: 9 (ref 5–15)
BUN: 5 mg/dL — ABNORMAL LOW (ref 8–23)
CO2: 22 mmol/L (ref 22–32)
Calcium: 8 mg/dL — ABNORMAL LOW (ref 8.9–10.3)
Chloride: 108 mmol/L (ref 98–111)
Creatinine, Ser: 0.56 mg/dL (ref 0.44–1.00)
GFR, Estimated: >60 mL/min (ref >60)
Glucose, Bld: 85 mg/dL (ref 70–99)
Potassium: 3.8 mmol/L (ref 3.5–5.1)
Sodium: 139 mmol/L (ref 135–145)

## 2021-02-04 LAB — CBC
HCT: 30.3 % — ABNORMAL LOW (ref 36.0–46.0)
Hemoglobin: 9.6 g/dL — ABNORMAL LOW (ref 12.0–15.0)
MCH: 27 pg (ref 26.0–34.0)
MCHC: 31.7 g/dL (ref 30.0–36.0)
MCV: 85.4 fL (ref 80.0–100.0)
Platelets: 103 10*3/uL — ABNORMAL LOW (ref 150–400)
RBC: 3.55 MIL/uL — ABNORMAL LOW (ref 3.87–5.11)
RDW: 26.1 % — ABNORMAL HIGH (ref 11.5–15.5)
WBC: 6.4 10*3/uL (ref 4.0–10.5)
nRBC: 1.1 % — ABNORMAL HIGH (ref 0.0–0.2)

## 2021-02-04 LAB — GLUCOSE, CAPILLARY
Glucose-Capillary: 83 mg/dL (ref 70–99)
Glucose-Capillary: 84 mg/dL (ref 70–99)
Glucose-Capillary: 85 mg/dL (ref 70–99)
Glucose-Capillary: 92 mg/dL (ref 70–99)

## 2021-02-04 LAB — MAGNESIUM: Magnesium: 2 mg/dL (ref 1.7–2.4)

## 2021-02-04 MED ORDER — RIVAROXABAN 20 MG PO TABS
20.0000 mg | ORAL_TABLET | Freq: Every day | ORAL | Status: DC
  Administered 2021-02-04 – 2021-02-13 (×10): 20 mg via ORAL
  Filled 2021-02-04 (×10): qty 1

## 2021-02-04 NOTE — Progress Notes (Signed)
"Admitted PROGRESS NOTE    Katrina Donaldson  [id number] DOB: 1956/02/03 DOA: 01/26/2021 PCP: Donald Prose, MD   Brief Narrative:  60 Past medical history of SAH, thoracic myelopathy, wheelchair dependent, bedbound, history of DVT on Xarelto, HTN, IDA, hypothyroidism, anxiety, depression.  Presents with complaints of abnormal lab with hemoglobin of 3.0. Suspect upper GI bleed.  GI consulted.  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  General surgery was consulted for resection.  Patient underwent laparoscopic colectomy on 8/4 by surgery. C scope path- Acute tubulovillous adenoma with high-grade dysplasia.  Awaiting bowel function to return.   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  Acute proctitis -Admission hemoglobin 3.0.  Hemoccult positive.  Status post 4 unit PRBC transfusion.  Advance diet as tolerated.  Out of bed to chair, mobilize. - PPI twice daily.  Will resume Xarelto when cleared by general surgery - CTA-negative for acute bleeding - Colonoscopy-showed cecal mass.  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 Surgical path- pending.  - CEA-1.2 -CT chest abdomen pelvis-cecal mass, large hiatal hernia, chronic passive atelectasis, nonobstructing renal stone. No signs of metastatic disease  Large hiatal hernia with intrathoracic stomach, severe left hemidiaphragm - PPI.  Management per surgery if surgery is indicated  Maculopapular rash; allergic dermatitis-improved -Torso and upper extremity.  On Pepcid and topical hydrocortisone.  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   Medial coccyx stage II pressure ulcer; POA Continue home dressing.   Remove Foley   DVT prophylaxis: Subcu Lovenox, will start Xarelto if ok with Surgery Code Status: Full Family Communication:   Status is: Inpatient  Remains inpatient appropriate because:Inpatient level of care appropriate due to severity of illness  Dispo:  Patient From: [place]  Planned Disposition: [place].  Awaiting return of bowel function and clearance by general surgery.  Medically stable for discharge: No         Nutritional status  Nutrition Problem: Increased nutrient needs Etiology: post-op healing  Signs/Symptoms: estimated needs  Interventions: Refer to RD note for recommendations  Body mass index is 32.19 kg/m.  Pressure Injury 01/27/21 Coccyx Medial Stage 2 -  Partial thickness loss of dermis presenting as a shallow open injury with a red, pink wound bed without slough. (Active)  01/27/21 0311  Location: Coccyx  Location Orientation: Medial  Staging: Stage 2 -  Partial thickness loss of dermis presenting as a shallow open injury with a red, pink wound bed without slough.  Wound Description (Comments):   Present on Admission:           Subjective: + flatus, but no bowel movement yet.  Denies any abd pain. No signs of bleeding.   Review of Systems Otherwise negative except as per HPI, including: General = no fevers, chills, dizziness,  fatigue HEENT/EYES = negative for loss of vision,  double vision, blurred vision,  sore throa Cardiovascular= negative for chest pain, palpitation Respiratory/lungs= negative for shortness of breath, cough,  wheezing; hemoptysis,  Gastrointestinal= negative for nausea, vomiting, abdominal pain Genitourinary= negative for Dysuria MSK = Negative for arthralgia, myalgias Neurology= Negative for headache, numbness, tingling  Psychiatry= Negative for suicidal and homocidal ideation Skin= Negative for Rash    Examination: Constitutional: Not in acute distress Respiratory: Clear to auscultation bilaterally Cardiovascular: Normal sinus rhythm, no rubs Abdomen: Nontender nondistended good bowel sounds Musculoskeletal: No edema noted Skin: No rashes seen Neurologic: CN 2-12 grossly intact.  And nonfocal Psychiatric: Normal judgment and insight. Alert and oriented x 3. Normal mood.    Surgical site looks ok.     Objective: Vitals:   02/03/21 2136 02/03/21 2350 02/04/21 0505 02/04/21 0802  BP: (!) 144/82 (!) 147/83 (!) 152/82 (!) 151/88  Pulse: 86 87 81 86  Resp: '18 18 18 18  '$ Temp: 98.2 F (36.8 C) 99.2 F (37.3 C) 99 F (37.2 C) 98.2 F (36.8 C)  TempSrc: Oral Oral Oral Oral  SpO2: 93% 96% 94% 96%  Weight:      Height:        Intake/Output Summary (Last 24 hours) at 02/04/2021 1112 Last data filed at 02/04/2021 0700 Gross per 24 hour  Intake 388.13 ml  Output 500 ml  Net -111.87 ml   Filed Weights   01/27/21 0129 01/29/21 0727 01/31/21 0913  Weight: 89.1 kg 89.1 kg 89.1 kg     Data Reviewed:   CBC: Recent Labs  Lab 01/29/21 0442 01/30/21 0217 01/31/21 0115 02/01/21 0636 02/02/21 0410 02/03/21 0505 02/04/21 0620  WBC 7.1 7.2 7.7 8.6 12.3* 9.3 6.4  NEUTROABS 5.1 6.3 7.0  --   --   --   --   HGB 10.5* 10.4* 11.2* 10.2* 9.6* 9.1* 9.6*  HCT 32.5* 32.6* 35.5* 33.1* 30.3* 29.3* 30.3*  MCV 82.1 83.0 83.5 85.8 85.1 86.2 85.4  PLT 155 153 151 133* 125* 113* [id number]*   Basic Metabolic Panel: Recent Labs  Lab 01/29/21 0442 02/01/21 0636 02/02/21 0410 02/03/21 0505 02/04/21 0620  NA 142 143 143 141 139  K 3.3* 3.2* 3.3* 3.0* 3.8  CL 110 111 109 108 108  CO2 '25 23 26  27 22  '$ GLUCOSE 93 83 116* 93 85  BUN 5* 5* 5* 5* 5*  CREATININE 0.57 0.65 0.77 0.61 0.56  CALCIUM 8.1* 8.3* 8.4* 8.1* 8.0*  MG  --  1.9 1.8 1.6* 2.0   GFR: Estimated Creatinine Clearance: 76.1 mL/min (by C-G formula based on SCr of 0.56 mg/dL). Liver Function Tests: No results for input(s): AST, ALT, ALKPHOS, BILITOT, PROT, ALBUMIN in the last 168 hours.  No results for input(s): LIPASE, AMYLASE in the last 168 hours. No results for input(s): AMMONIA in the last 168 hours. Coagulation Profile: No results for input(s): INR, PROTIME in the last 168 hours. Cardiac Enzymes: No results for input(s): CKTOTAL, CKMB, CKMBINDEX, TROPONINI in the last 168 hours. BNP (last 3 results) No results for input(s): PROBNP in the last 8760 hours. HbA1C: No results for input(s): HGBA1C in the last 72 hours. CBG: Recent Labs  Lab 02/03/21 0630 02/03/21 1230 02/03/21 1837 02/03/21 2349 02/04/21 0622  GLUCAP 94 101* 88 101* 85   Lipid Profile: No results for input(s): CHOL, HDL, LDLCALC, TRIG, CHOLHDL, LDLDIRECT in the last 72 hours. Thyroid Function Tests: No results for input(s): TSH, T4TOTAL, FREET4, T3FREE, THYROIDAB in the last 72 hours. Anemia Panel: No results for  input(s): VITAMINB12, FOLATE, FERRITIN, TIBC, IRON, RETICCTPCT in the last 72 hours. Sepsis Labs: No results for input(s): PROCALCITON, LATICACIDVEN in the last 168 hours.  Recent Results (from the past 240 hour(s))  SARS CORONAVIRUS 2 (TAT 6-24 HRS) Nasopharyngeal Nasopharyngeal Swab     Status: None   Collection Time: 01/26/21 11:00 PM   Specimen: Nasopharyngeal Swab  Result Value Ref Range Status   SARS Coronavirus 2 NEGATIVE NEGATIVE Final    Comment: (NOTE) SARS-CoV-2 target nucleic acids are NOT DETECTED.  The SARS-CoV-2 RNA is generally detectable in upper and lower respiratory specimens during the acute phase of infection. Negative results do not preclude SARS-CoV-2 infection, do not rule out co-infections with  other pathogens, and should not be used as the sole basis for treatment or other patient management decisions. Negative results must be combined with clinical observations, patient history, and epidemiological information. The expected result is Negative.  Fact Sheet for Patients: SugarRoll.be  Fact Sheet for Healthcare Providers: https://www.woods-mathews.com/  This test is not yet approved or cleared by the [place] FDA and  has been authorized for detection and/or diagnosis of SARS-CoV-2 by FDA under an Emergency Use Authorization (EUA). This EUA will remain  in effect (meaning this test can be used) for the duration of the COVID-19 declaration under Se ction 564(b)(1) of the Act, 21 U.S.C. section 360bbb-3(b)(1), unless the authorization is terminated or revoked sooner.  Performed at [facility], [place] [address] [place], [place] [address]   Surgical pcr screen     Status: None   Collection Time: 01/31/21  9:19 PM   Specimen: Nasal Mucosa; Nasal Swab  Result Value Ref Range Status   MRSA, PCR NEGATIVE NEGATIVE Final   Staphylococcus aureus NEGATIVE NEGATIVE Final    Comment: (NOTE) The Xpert SA Assay (FDA approved for NASAL specimens in patients 60 years of age and older), is one component of a comprehensive surveillance program. It is not intended to diagnose infection nor to guide or monitor treatment. Performed at [facility], [place] [address] [place],  [address]          Radiology Studies: No results found.      Scheduled Meds:  Chlorhexidine Gluconate Cloth  6 each Topical Daily   enoxaparin (LOVENOX) injection  40 mg Subcutaneous Q24H   famotidine  20 mg Oral Daily   hydrocortisone cream   Topical QID   hydrOXYzine  25 mg Oral TID   levothyroxine  125 mcg Oral QAC breakfast   metoprolol tartrate  25 mg Oral BID   pantoprazole (PROTONIX) IV  40 mg Intravenous Q12H   PARoxetine  20 mg  Oral Daily   Continuous Infusions:  lactated ringers 10 mL/hr at 02/01/21 1512     LOS: 9 days   Time spent= 35 mins    Knut Rondinelli Arsenio Loader, MD [facility]  If 7PM-7AM, please contact night-coverage  02/04/2021, 11:12 AM

## 2021-02-04 NOTE — Progress Notes (Signed)
Patient ID: Katrina Donaldson, female   DOB: June 10, 1956, 65 y.o.   MRN: [id number] 3 Days Post-Op   Subjective: Voiding with foley out.  Taking in some FLD, but unsure how much.  + flatus.  Unsure whether she has had a BM or not.  No stool documented.  ROS negative except as listed above. Objective: Vital signs in last 24 hours: Temp:  [98.2 F (36.8 C)-99.2 F (37.3 C)] 98.2 F (36.8 C) (08/07 0802) Pulse Rate:  [81-87] 86 (08/07 0802) Resp:  [16-20] 18 (08/07 0802) BP: (144-161)/(82-94) 151/88 (08/07 0802) SpO2:  [93 %-96 %] 96 % (08/07 0802) Last BM Date: 01/31/21  Intake/Output from previous day: 08/06 0701 - 08/07 0700 In: 388.1 [P.O.:50; IV Piggyback:338.1] Out: 575 [Urine:575] Intake/Output this shift: No intake/output data recorded.  PE: Abd: soft, appropriately tender, great BS, midline incision is c/d/I with a staples  Lab Results: CBC  Recent Labs    02/03/21 0505 02/04/21 0620  WBC 9.3 6.4  HGB 9.1* 9.6*  HCT 29.3* 30.3*  PLT 113* 103*   BMET Recent Labs    02/03/21 0505 02/04/21 0620  NA 141 139  K 3.0* 3.8  CL 108 108  CO2 27 22  GLUCOSE 93 85  BUN 5* 5*  CREATININE 0.61 0.56  CALCIUM 8.1* 8.0*   PT/INR No results for input(s): LABPROT, INR in the last 72 hours. ABG No results for input(s): PHART, HCO3 in the last 72 hours.  Invalid input(s): PCO2, PO2  Studies/Results:   Anti-infectives: Anti-infectives (From admission, onward)    Start     Dose/Rate Route Frequency Ordered Stop   02/01/21 0600  cefoTEtan (CEFOTAN) 2 g in sodium chloride 0.9 % 100 mL IVPB        2 g 200 mL/hr over 30 Minutes Intravenous On call to O.R. 01/31/21 1450 02/01/21 1013       Assessment/Plan: POD 3, S/P laparoscopic assisted R colectomy 8/5 by Dr. Grandville Silos for Cecal mass -path pending -cont FLD.  Patient doesn't feel ready for solid food.  Unclear how much she actually ate yesterday.  But no nausea -wheelchair bound, but mobilize as able to chair etc  with therapies -incentive spirometer -may resume full-dose anticoagulation  FEN - FLD/IVFs per med VTE - SCDs, Lovenox 40 mg but may adv to full dose anticoagulation.  Hgb stable ID - none Foley - out 8/6  H/O DVT  HTN Hypothyroidism Wheelchair bound  LOS: 9 days   Henreitta Cea, PA-C [facility] Use [email or web address] to contact on call provider  02/04/2021

## 2021-02-05 DIAGNOSIS — K6389 Other specified diseases of intestine: Secondary | ICD-10-CM | POA: Diagnosis not present

## 2021-02-05 DIAGNOSIS — K922 Gastrointestinal hemorrhage, unspecified: Secondary | ICD-10-CM | POA: Diagnosis not present

## 2021-02-05 DIAGNOSIS — R935 Abnormal findings on diagnostic imaging of other abdominal regions, including retroperitoneum: Secondary | ICD-10-CM | POA: Diagnosis not present

## 2021-02-05 LAB — BASIC METABOLIC PANEL
Anion gap: 10 (ref 5–15)
BUN: <5 mg/dL — ABNORMAL LOW (ref 8–23)
CO2: 23 mmol/L (ref 22–32)
Calcium: 8.1 mg/dL — ABNORMAL LOW (ref 8.9–10.3)
Chloride: 105 mmol/L (ref 98–111)
Creatinine, Ser: 0.56 mg/dL (ref 0.44–1.00)
GFR, Estimated: >60 mL/min (ref >60)
Glucose, Bld: 80 mg/dL (ref 70–99)
Potassium: 3.2 mmol/L — ABNORMAL LOW (ref 3.5–5.1)
Sodium: 138 mmol/L (ref 135–145)

## 2021-02-05 LAB — GLUCOSE, CAPILLARY
Glucose-Capillary: 73 mg/dL (ref 70–99)
Glucose-Capillary: 84 mg/dL (ref 70–99)
Glucose-Capillary: 92 mg/dL (ref 70–99)
Glucose-Capillary: 75 mg/dL (ref 70–99)
Glucose-Capillary: 85 mg/dL (ref 70–99)

## 2021-02-05 LAB — CBC
HCT: 31.5 % — ABNORMAL LOW (ref 36.0–46.0)
Hemoglobin: 9.8 g/dL — ABNORMAL LOW (ref 12.0–15.0)
MCH: 26.4 pg (ref 26.0–34.0)
MCHC: 31.1 g/dL (ref 30.0–36.0)
MCV: 84.9 fL (ref 80.0–100.0)
Platelets: 111 10*3/uL — ABNORMAL LOW (ref 150–400)
RBC: 3.71 MIL/uL — ABNORMAL LOW (ref 3.87–5.11)
RDW: 26.3 % — ABNORMAL HIGH (ref 11.5–15.5)
WBC: 7.1 10*3/uL (ref 4.0–10.5)
nRBC: 0.8 % — ABNORMAL HIGH (ref 0.0–0.2)

## 2021-02-05 LAB — MAGNESIUM: Magnesium: 1.8 mg/dL (ref 1.7–2.4)

## 2021-02-05 MED ORDER — POTASSIUM CHLORIDE 10 MEQ/100ML IV SOLN
10.0000 meq | INTRAVENOUS | Status: AC
  Administered 2021-02-05 (×4): 10 meq via INTRAVENOUS
  Filled 2021-02-05 (×4): qty 100

## 2021-02-05 MED ORDER — BOOST / RESOURCE BREEZE PO LIQD CUSTOM
1.0000 | Freq: Three times a day (TID) | ORAL | Status: DC
  Administered 2021-02-05 (×3): 1 via ORAL

## 2021-02-05 MED ORDER — BISACODYL 10 MG RE SUPP
10.0000 mg | Freq: Once | RECTAL | Status: AC
  Administered 2021-02-05: 10 mg via RECTAL
  Filled 2021-02-05: qty 1

## 2021-02-05 NOTE — Progress Notes (Signed)
"Admitted PROGRESS NOTE    Katrina Donaldson  [id number] DOB: 1955-11-28 DOA: 01/26/2021 PCP: Donald Prose, MD   Brief Narrative:  61 Past medical history of SAH, thoracic myelopathy, wheelchair dependent, bedbound, history of DVT on Xarelto, HTN, IDA, hypothyroidism, anxiety, depression.  Presents with complaints of abnormal lab with hemoglobin of 3.0. Suspect upper GI bleed.  GI consulted.  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  General surgery was consulted for resection.  Patient underwent laparoscopic colectomy on 8/4 by surgery. C scope path- Acute tubulovillous adenoma with high-grade dysplasia.  Awaiting bowel function to return.   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  Acute proctitis -Admission hemoglobin 3.0.  Hemoccult positive.  Status post 4 unit PRBC transfusion.  On Soft diet.  Out of bed to chair, mobilize. - PPI twice daily.  Xarelto resumed.  - CTA-negative for acute bleeding - Colonoscopy-showed cecal mass.  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 Surgical path- pending.  - CEA-1.2 -CT chest abdomen pelvis-cecal mass, large hiatal hernia, chronic passive atelectasis, nonobstructing renal stone. No signs of metastatic disease  Large hiatal hernia with intrathoracic stomach, severe left hemidiaphragm - PPI.  Management per surgery if surgery is indicated  Maculopapular rash; allergic dermatitis-almost resolved.  -Torso and upper extremity.  On Pepcid and topical hydrocortisone.  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   Medial coccyx stage II pressure ulcer; POA Continue home dressing.   Remove Foley Discontinue Central line, has Good IV access per RN TOC consulted for SNF  DVT prophylaxis: Xarelto  Code Status: Full Family Communication:   Status is: Inpatient  Remains inpatient appropriate because:Inpatient level of care appropriate due to severity of illness  Dispo:  Patient From: [facility]  Planned Disposition: [facility].  Awaiting return of bowel function and clearance by general surgery.  Medically stable for discharge: No         Nutritional status  Nutrition Problem: Increased nutrient needs Etiology: post-op healing  Signs/Symptoms: estimated needs  Interventions: Refer to RD note for recommendations  Body mass index is 32.19 kg/m.  Pressure Injury 01/27/21 Coccyx Medial Stage 2 -  Partial thickness loss of dermis presenting as a shallow open injury with a red, pink wound bed without slough. (Active)  01/27/21 0311  Location: Coccyx  Location Orientation: Medial  Staging: Stage 2 -  Partial thickness loss of dermis presenting as a shallow open injury with a red, pink wound bed without slough.  Wound Description (Comments):   Present on Admission:           Subjective: Feels ok, no acute events overngiht. Doesn't know if she is passing gas.   Review of Systems Otherwise negative except as per HPI, including: General = no fevers, chills, dizziness,  fatigue HEENT/EYES = negative for loss of vision, double vision,  blurred vision,  sore throa Cardiovascular= negative for chest pain, palpitation Respiratory/lungs= negative for shortness of breath, cough, wheezing;  hemoptysis,  Gastrointestinal= negative for nausea, vomiting, abdominal pain Genitourinary= negative for Dysuria MSK = Negative for arthralgia, myalgias Neurology= Negative for headache, numbness, tingling  Psychiatry= Negative for suicidal and homocidal ideation Skin= Negative for Rash    Examination: Constitutional: Not in acute distress Respiratory: Clear to auscultation bilaterally Cardiovascular: Normal sinus rhythm, no rubs Abdomen: Nontender nondistended good bowel sounds Musculoskeletal: No edema noted Skin: No rashes seen Neurologic: CN 2-12 grossly intact.  And nonfocal Psychiatric: Normal judgment and insight. Alert and oriented x 3. Normal mood.   Surgical site looks ok.     Objective: Vitals:   02/04/21 2337 02/05/21 0342 02/05/21 0824 02/05/21 0829  BP: (!) 155/84 (!) 147/87 (!) 142/95   Pulse: 78 95 (!) 51 (!) 106  Resp: '18 18 19   '$ Temp: 98.1 F (36.7 C) 98.7 F (37.1 C) 98.9 F (37.2 C)   TempSrc: Oral Oral Oral   SpO2: 100% 100% 99%   Weight:      Height:        Intake/Output Summary (Last 24 hours) at 02/05/2021 1127 Last data filed at 02/05/2021 0348 Gross per 24 hour  Intake 100 ml  Output 1000 ml  Net -900 ml   Filed Weights   01/27/21 0129 01/29/21 0727 01/31/21 0913  Weight: 89.1 kg 89.1 kg 89.1 kg     Data Reviewed:   CBC: Recent Labs  Lab 01/30/21 0217 01/31/21 0115 02/01/21 0636 02/02/21 0410 02/03/21 0505 02/04/21 0620 02/05/21 0647  WBC 7.2 7.7 8.6 12.3* 9.3 6.4 7.1  NEUTROABS 6.3 7.0  --   --   --   --   --   HGB 10.4* 11.2* 10.2* 9.6* 9.1* 9.6* 9.8*  HCT 32.6* 35.5* 33.1* 30.3* 29.3* 30.3* 31.5*  MCV 83.0 83.5 85.8 85.1 86.2 85.4 84.9  PLT 153 151 133* 125* 113* 103* [id number]*   Basic Metabolic Panel: Recent Labs  Lab 02/01/21 0636 02/02/21 0410 02/03/21 0505 02/04/21 0620 02/05/21 0647  NA 143 143 141 139 138  K 3.2* 3.3* 3.0* 3.8 3.2*  CL 111 109 108 108 105  CO2 '23 26 27 22 23  '$ GLUCOSE 83 116* 93 85 80  BUN  5* 5* 5* 5* <5*  CREATININE 0.65 0.77 0.61 0.56 0.56  CALCIUM 8.3* 8.4* 8.1* 8.0* 8.1*  MG 1.9 1.8 1.6* 2.0 1.8   GFR: Estimated Creatinine Clearance: 76.1 mL/min (by C-G formula based on SCr of 0.56 mg/dL). Liver Function Tests: No results for input(s): AST, ALT, ALKPHOS, BILITOT, PROT, ALBUMIN in the last 168 hours.  No results for input(s): LIPASE, AMYLASE in the last 168 hours. No results for input(s): AMMONIA in the last 168 hours. Coagulation Profile: No results for input(s): INR, PROTIME in the last 168 hours. Cardiac Enzymes: No results for input(s): CKTOTAL, CKMB, CKMBINDEX, TROPONINI in the last 168 hours. BNP (last 3 results) No results for input(s): PROBNP in the last 8760 hours. HbA1C: No results for input(s): HGBA1C in the last 72 hours. CBG: Recent Labs  Lab 02/04/21 1201 02/04/21 1342 02/04/21 1829 02/04/21 2340 02/05/21 0606  GLUCAP 73 92 83 84 84   Lipid Profile: No results for input(s): CHOL, HDL, LDLCALC, TRIG, CHOLHDL, LDLDIRECT in the last 72 hours. Thyroid Function Tests: No results for input(s): TSH, T4TOTAL, FREET4, T3FREE, THYROIDAB in the last 72 hours. Anemia Panel: No results for input(s): VITAMINB12, FOLATE, FERRITIN, TIBC,  IRON, RETICCTPCT in the last 72 hours. Sepsis Labs: No results for input(s): PROCALCITON, LATICACIDVEN in the last 168 hours.  Recent Results (from the past 240 hour(s))  SARS CORONAVIRUS 2 (TAT 6-24 HRS) Nasopharyngeal Nasopharyngeal Swab     Status: None   Collection Time: 01/26/21 11:00 PM   Specimen: Nasopharyngeal Swab  Result Value Ref Range Status   SARS Coronavirus 2 NEGATIVE NEGATIVE Final    Comment: (NOTE) SARS-CoV-2 target nucleic acids are NOT DETECTED.  The SARS-CoV-2 RNA is generally detectable in upper and lower respiratory specimens during the acute phase of infection. Negative results do not preclude SARS-CoV-2 infection, do not rule out co-infections with other pathogens, and should not be used as  the sole basis for treatment or other patient management decisions. Negative results must be combined with clinical observations, patient history, and epidemiological information. The expected result is Negative.  Fact Sheet for Patients: SugarRoll.be  Fact Sheet for Healthcare Providers: https://www.woods-mathews.com/  This test is not yet approved or cleared by the [place] FDA and  has been authorized for detection and/or diagnosis of SARS-CoV-2 by FDA under an Emergency Use Authorization (EUA). This EUA will remain  in effect (meaning this test can be used) for the duration of the COVID-19 declaration under Se ction 564(b)(1) of the Act, 21 U.S.C. section 360bbb-3(b)(1), unless the authorization is terminated or revoked sooner.  Performed at [facility], [place] [address], [place], [place] [address]   Surgical pcr screen     Status: None   Collection Time: 01/31/21  9:19 PM   Specimen: Nasal Mucosa; Nasal Swab  Result Value Ref Range Status   MRSA, PCR NEGATIVE NEGATIVE Final   Staphylococcus aureus NEGATIVE NEGATIVE Final    Comment: (NOTE) The Xpert SA Assay (FDA approved for NASAL specimens in patients 24 years of age and older), is one component of a comprehensive surveillance program. It is not intended to diagnose infection nor to guide or monitor treatment. Performed at [facility], [place] [address] [place], [place] [address]          Radiology Studies: No results found.      Scheduled Meds:  Chlorhexidine Gluconate Cloth  6 each Topical Daily   famotidine  20 mg Oral Daily   feeding supplement  1 Container Oral TID BM   hydrocortisone cream   Topical QID   hydrOXYzine  25 mg Oral TID   levothyroxine  125 mcg Oral QAC breakfast   metoprolol tartrate  25 mg Oral BID   pantoprazole (PROTONIX) IV  40 mg Intravenous Q12H   PARoxetine  20 mg Oral Daily   rivaroxaban  20 mg Oral Q supper    Continuous Infusions:  lactated ringers 10 mL/hr at 02/01/21 1512     LOS: 10 days   Time spent= 35 mins    Adith Tejada Arsenio Loader, MD [facility]  If 7PM-7AM, please contact night-coverage  02/05/2021, 11:27 AM

## 2021-02-05 NOTE — Progress Notes (Signed)
Physical Therapy Treatment Patient Details Name: Katrina Donaldson MRN: [id number] DOB: 1956/05/22 Today's Date: 02/05/2021    History of Present Illness 65 yo female presents to [facility] on 7/29 with n/v from [facility], hgb found to be 3.0 and received PRBC. 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 PMH Includes prior [facility] 2016, HTN, chronic anticoagulation on Xarelto, DVT, depression, UTI, brain aneurysm repair, thoracic laminectomy 2018.    PT Comments    Patient not willing to participate in PT this session due to her show being on. She states she does not want continued PT. Requires total assist for bed mobility currently. Since patient is not participating and does not want continued care will sign off at this time.       Follow Up Recommendations  SNF     Equipment Recommendations  None recommended by PT    Recommendations for Other Services       Precautions / Restrictions Precautions Precautions: Fall Restrictions Weight Bearing Restrictions: No    Mobility  Bed Mobility Overal bed mobility: Needs Assistance Bed Mobility: Supine to Sit   Sidelying to sit: Max assist       General bed mobility comments: patient is resistant to mobility this session. Wanted me to come back when her show was over, explained that I had tech help now, and could not wait and she stated "oh well, see you tomorrow." At which point I asked her if she wanted [place] to come back and she said "no, not really."    Transfers                    Ambulation/Gait                 Stairs             Wheelchair Mobility    Modified Rankin (Stroke Patients Only)       Balance                                            Cognition Arousal/Alertness: Awake/alert Behavior During Therapy: Flat affect Overall Cognitive  Status: Difficult to assess                                 General Comments: patient does not respond at times when speaking to her and then states that she is watching her show.      Exercises      General Comments        Pertinent Vitals/Pain Pain Assessment: Faces Faces Pain Scale: Hurts a little bit Pain Location: legs with moving Pain Descriptors / Indicators: Discomfort;Grimacing;Moaning Pain Intervention(s): Monitored during session;Repositioned;Limited activity within patient's tolerance    Home Living                      Prior Function            PT Goals (current goals can now be found in the care plan section) Acute Rehab PT Goals Patient Stated Goal: back to [facility] Progress towards PT goals: Not progressing toward goals - comment    Frequency           PT Plan Discharge plan  needs to be updated;Frequency needs to be updated    Co-evaluation              AM-PAC PT "6 Clicks" Mobility   Outcome Measure  Help needed turning from your back to your side while in a flat bed without using bedrails?: Total Help needed moving from lying on your back to sitting on the side of a flat bed without using bedrails?: Total Help needed moving to and from a bed to a chair (including a wheelchair)?: Total Help needed standing up from a chair using your arms (e.g., wheelchair or bedside chair)?: Total Help needed to walk in hospital room?: Total Help needed climbing 3-5 steps with a railing? : Total 6 Click Score: 6    End of Session   Activity Tolerance: Other (comment) (patient is not willing to participate) Patient left: in bed;with call bell/phone within reach Nurse Communication: Mobility status PT Visit Diagnosis: Other abnormalities of gait and mobility (R26.89);Muscle weakness (generalized) (M62.81)     Time: [id number] PT Time Calculation (min) (ACUTE ONLY): 15 min  Charges:  $Therapeutic Activity: 8-22 mins                     Egan Sahlin, PT, GCS 02/05/21,2:14 PM

## 2021-02-05 NOTE — Progress Notes (Signed)
Patient ID: Katrina Donaldson, female   DOB: [birth year], 65 y.o.   MRN: [id number] 4 Days Post-Op   Subjective: Eating some liquids but not a great diet.  Denies nausea.  No BM but still flatus.  ROS negative except as listed above. Objective: Vital signs in last 24 hours: Temp:  [98.1 F (36.7 C)-99.2 F (37.3 C)] 98.9 F (37.2 C) (08/08 0824) Pulse Rate:  [51-106] 106 (08/08 0829) Resp:  [15-19] 19 (08/08 0824) BP: (142-161)/(84-99) 142/95 (08/08 0824) SpO2:  [96 %-100 %] 99 % (08/08 0824) Last BM Date: 01/31/21  Intake/Output from previous day: 08/07 0701 - 08/08 0700 In: 200 [P.O.:200] Out: 1150 [Urine:1150] Intake/Output this shift: No intake/output data recorded.  PE: Abd: soft, but slightly more distended today than yesterday, appropriately tender, great BS, midline incision is c/d/I with a staples  Lab Results: CBC  Recent Labs    02/04/21 0620 02/05/21 0647  WBC 6.4 7.1  HGB 9.6* 9.8*  HCT 30.3* 31.5*  PLT 103* 111*   BMET Recent Labs    02/04/21 0620 02/05/21 0647  NA 139 138  K 3.8 3.2*  CL 108 105  CO2 22 23  GLUCOSE 85 80  BUN 5* <5*  CREATININE 0.56 0.56  CALCIUM 8.0* 8.1*   PT/INR No results for input(s): LABPROT, INR in the last 72 hours. ABG No results for input(s): PHART, HCO3 in the last 72 hours.  Invalid input(s): PCO2, PO2  Studies/Results:   Anti-infectives: Anti-infectives (From admission, onward)    Start     Dose/Rate Route Frequency Ordered Stop   02/01/21 0600  cefoTEtan (CEFOTAN) 2 g in sodium chloride 0.9 % 100 mL IVPB        2 g 200 mL/hr over 30 Minutes Intravenous On call to O.R. 01/31/21 1450 02/01/21 1013       Assessment/Plan: POD 4, S/P laparoscopic assisted R colectomy 8/5 by Dr. Grandville Silos for Cecal mass -path pending -soft diet as she has good BS.  Mild distention.  Will give suppository to help jump start bowel function  -add shakes as she doesn't have a great appetite currently -wheelchair bound, but  mobilize as able to chair etc with therapies -incentive spirometer -may resume full-dose anticoagulation  FEN - soft, ensure/IVFs per med VTE - SCDs, Lovenox 40 mg but may adv to full dose anticoagulation.  Hgb stable ID - none Foley - out 8/6  H/O DVT  HTN Hypothyroidism Wheelchair bound  LOS: 10 days   Henreitta Cea, PA-C Trauma [facility] General Surgery Use [email or web address] to contact on call provider  02/05/2021

## 2021-02-06 DIAGNOSIS — K922 Gastrointestinal hemorrhage, unspecified: Secondary | ICD-10-CM | POA: Diagnosis not present

## 2021-02-06 DIAGNOSIS — K6389 Other specified diseases of intestine: Secondary | ICD-10-CM | POA: Diagnosis not present

## 2021-02-06 LAB — CBC
HCT: 29.9 % — ABNORMAL LOW (ref 36.0–46.0)
Hemoglobin: 9.3 g/dL — ABNORMAL LOW (ref 12.0–15.0)
MCH: 27 pg (ref 26.0–34.0)
MCHC: 31.1 g/dL (ref 30.0–36.0)
MCV: 86.9 fL (ref 80.0–100.0)
Platelets: 127 10*3/uL — ABNORMAL LOW (ref 150–400)
RBC: 3.44 MIL/uL — ABNORMAL LOW (ref 3.87–5.11)
RDW: 26.8 % — ABNORMAL HIGH (ref 11.5–15.5)
WBC: 6.9 10*3/uL (ref 4.0–10.5)
nRBC: 0.3 % — ABNORMAL HIGH (ref 0.0–0.2)

## 2021-02-06 LAB — BASIC METABOLIC PANEL
Anion gap: 9 (ref 5–15)
BUN: <5 mg/dL — ABNORMAL LOW (ref 8–23)
CO2: 22 mmol/L (ref 22–32)
Calcium: 8 mg/dL — ABNORMAL LOW (ref 8.9–10.3)
Chloride: 105 mmol/L (ref 98–111)
Creatinine, Ser: 0.61 mg/dL (ref 0.44–1.00)
GFR, Estimated: >60 mL/min (ref >60)
Glucose, Bld: 77 mg/dL (ref 70–99)
Potassium: 3.6 mmol/L (ref 3.5–5.1)
Sodium: 136 mmol/L (ref 135–145)

## 2021-02-06 LAB — MAGNESIUM: Magnesium: 1.6 mg/dL — ABNORMAL LOW (ref 1.7–2.4)

## 2021-02-06 LAB — GLUCOSE, CAPILLARY
Glucose-Capillary: 77 mg/dL (ref 70–99)
Glucose-Capillary: 89 mg/dL (ref 70–99)
Glucose-Capillary: 90 mg/dL (ref 70–99)

## 2021-02-06 LAB — SURGICAL PATHOLOGY

## 2021-02-06 MED ORDER — POTASSIUM CHLORIDE 10 MEQ/100ML IV SOLN
10.0000 meq | INTRAVENOUS | Status: DC
Start: 2021-02-06 — End: 2021-02-06
  Administered 2021-02-06 (×2): 10 meq via INTRAVENOUS
  Filled 2021-02-06 (×2): qty 100

## 2021-02-06 MED ORDER — POTASSIUM CHLORIDE CRYS ER 20 MEQ PO TBCR
40.0000 meq | EXTENDED_RELEASE_TABLET | Freq: Once | ORAL | Status: AC
  Administered 2021-02-06: 40 meq via ORAL
  Filled 2021-02-06: qty 2

## 2021-02-06 MED ORDER — ENSURE MAX PROTEIN PO LIQD
11.0000 [oz_av] | Freq: Two times a day (BID) | ORAL | Status: DC
  Administered 2021-02-07 (×2): 11 [oz_av] via ORAL
  Filled 2021-02-06 (×4): qty 330

## 2021-02-06 MED ORDER — MAGNESIUM SULFATE 4 GM/100ML IV SOLN
4.0000 g | Freq: Once | INTRAVENOUS | Status: AC
  Administered 2021-02-06: 4 g via INTRAVENOUS
  Filled 2021-02-06: qty 100

## 2021-02-06 MED ORDER — PANTOPRAZOLE SODIUM 40 MG PO TBEC
40.0000 mg | DELAYED_RELEASE_TABLET | Freq: Two times a day (BID) | ORAL | Status: DC
  Administered 2021-02-07 – 2021-02-13 (×15): 40 mg via ORAL
  Filled 2021-02-06 (×15): qty 1

## 2021-02-06 NOTE — Progress Notes (Signed)
"Admitted PROGRESS NOTE    Katrina Donaldson  [id number] DOB: 1956/03/21 DOA: 01/26/2021 PCP: Donald Prose, MD   Brief Narrative:  65 Past medical history of SAH, thoracic myelopathy, wheelchair dependent, bedbound, history of DVT on Xarelto, HTN, IDA, hypothyroidism, anxiety, depression.  Presents with complaints of abnormal lab with hemoglobin of 3.0. Suspect upper GI bleed.  GI consulted.  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  General surgery was consulted for resection.  Patient underwent laparoscopic colectomy on 8/4 by surgery. C scope path- Acute tubulovillous adenoma with high-grade dysplasia.  Awaiting bowel function to return.  TOC consulted for SNF placement.   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  Acute proctitis -Admission hemoglobin 3.0.  Hemoccult positive.  Status post 4 unit PRBC transfusion.  On Soft diet.  Out of bed to chair, mobilize. - PPI twice daily.  Xarelto resumed.  - CTA-negative for acute bleeding - Colonoscopy-showed cecal mass.  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 Surgical path- pending.  - CEA-1.2 -CT chest abdomen pelvis-cecal mass, large hiatal hernia, chronic passive atelectasis, nonobstructing renal stone. No signs of metastatic disease  Large hiatal hernia with intrathoracic stomach, severe left hemidiaphragm - PPI.  Management per surgery if surgery is indicated  Maculopapular rash; allergic dermatitis-almost resolved.  - Improved after receiving topical hydrocortisone, Solu-Medrol.  Can continue as needed Atarax if necessary.  Hypokalemia - Repletion    History  of anxiety and depression -Continue Paxil   Essential hypertension -Lopressor 25 mg twice daily   History of DVT, 2018 - Xarelto resumed   Hypothyroidism -Synthroid   Obesity Placing the patient at high risk for poor outcome.   Medial coccyx stage II pressure ulcer; POA Continue home dressing.   Remove Foley Central line discontinued on 8/9 TOC consulted for SNF  DVT prophylaxis: Xarelto  Code Status: Full Family Communication:   Status is: Inpatient  Remains inpatient appropriate because:Inpatient level of care appropriate due to severity of illness  Dispo:  Patient From: [facility]  Planned Disposition: [facility].  Awaiting bowel function to return, she will be better in next 24 hours.  In the meantime awaiting SNF placement.  TOC consulted.  Medically stable for discharge: No         Nutritional status  Nutrition Problem: Increased nutrient needs Etiology: post-op healing  Signs/Symptoms: estimated needs  Interventions: Refer to RD note for recommendations  Body mass index is 32.19 kg/m.  Pressure Injury 01/27/21 Coccyx Medial Stage 2 -  Partial thickness loss of dermis presenting as a shallow open injury with a red, pink wound bed without slough. (Active)  01/27/21 0311  Location: Coccyx  Location Orientation: Medial  Staging: Stage 2 -  Partial thickness loss of dermis presenting as a shallow open injury with a red, pink wound bed without slough.  Wound Description (Comments):   Present on Admission:           Subjective: Passing gas, no acute events overnight.  Tolerating oral intake slowly  Review of Systems Otherwise negative except as per HPI, including: General: Denies fever, chills, night sweats or unintended weight loss. Resp: Denies cough, wheezing, shortness of breath. Cardiac:  Denies chest pain, palpitations, orthopnea, paroxysmal nocturnal dyspnea. GI: Denies abdominal pain, nausea, vomiting,  diarrhea or constipation GU: Denies dysuria, frequency, hesitancy or incontinence MS: Denies muscle aches, joint pain or swelling Neuro: Denies headache, neurologic deficits (focal weakness, numbness, tingling), abnormal gait Psych: Denies anxiety, depression, SI/HI/AVH Skin: Denies new rashes or lesions ID: Denies sick contacts, exotic exposures, travel    Examination: Constitutional: Not in acute distress Respiratory: Clear to auscultation bilaterally Cardiovascular: Normal sinus rhythm, no rubs Abdomen: Nontender nondistended good bowel sounds Musculoskeletal: No edema noted Skin: No rashes seen Neurologic: CN 2-12 grossly intact.  And nonfocal Psychiatric: Normal judgment and insight. Alert and oriented x 3. Normal mood.  At times she has very flat affect.  Surgical site looks ok.     Objective: Vitals:   02/05/21 2325 02/06/21 0338 02/06/21 0846 02/06/21 1250  BP: (!) 150/88 (!) 142/83 (!) 141/89 (!) 152/92  Pulse: 84 85 91 89  Resp: '20 17 18 15  '$ Temp: 98.7 F (37.1 C) 98.7 F (37.1 C) 98 F (36.7 C) 98.2 F (36.8 C)  TempSrc: Oral Oral Oral Oral  SpO2: 100% 100% 100% 100%  Weight:      Height:        Intake/Output Summary (Last 24 hours) at 02/06/2021 1317 Last data filed at 02/06/2021 0116 Gross per 24 hour  Intake 1103.28 ml  Output 300 ml  Net 803.28 ml   Filed Weights   01/27/21 0129 01/29/21 0727 01/31/21 0913  Weight: 89.1 kg 89.1 kg 89.1 kg     Data Reviewed:   CBC: Recent Labs  Lab 01/31/21 0115 02/01/21 0636 02/02/21 0410 02/03/21 0505 02/04/21 0620 02/05/21 0647 02/06/21 0133  WBC 7.7   < > 12.3* 9.3 6.4 7.1 6.9  NEUTROABS 7.0  --   --   --   --   --   --   HGB 11.2*   < > 9.6* 9.1* 9.6* 9.8* 9.3*  HCT 35.5*   < > 30.3* 29.3* 30.3* 31.5* 29.9*  MCV 83.5   < > 85.1 86.2 85.4 84.9 86.9  PLT 151   < > 125* 113* 103* 111* 127*   < > = values in this interval not displayed.   Basic Metabolic Panel: Recent Labs  Lab 02/02/21 0410  02/03/21 0505 02/04/21 0620 02/05/21 0647 02/06/21 0133  NA 143 141 139 138 136  K 3.3* 3.0* 3.8 3.2* 3.6  CL 109 108 108 105 105  CO2 '26 27 22 23 22  '$ GLUCOSE 116* 93 85 80 77  BUN 5* 5* 5* <5* <5*  CREATININE 0.77 0.61 0.56 0.56 0.61  CALCIUM 8.4* 8.1* 8.0* 8.1* 8.0*  MG 1.8 1.6* 2.0 1.8 1.6*   GFR: Estimated Creatinine Clearance: 76.1 mL/min (by C-G formula based on SCr of 0.61 mg/dL). Liver Function Tests: No results for input(s): AST, ALT, ALKPHOS, BILITOT, PROT, ALBUMIN in the last 168 hours.  No results for input(s): LIPASE, AMYLASE in the last 168 hours. No results for input(s): AMMONIA in the last 168 hours. Coagulation Profile: No results for input(s): INR, PROTIME in the last 168 hours. Cardiac Enzymes: No results for input(s): CKTOTAL, CKMB, CKMBINDEX, TROPONINI in the last 168 hours. BNP (last 3 results) No results for input(s): PROBNP in the last 8760 hours. HbA1C: No results for input(s): HGBA1C in the last 72 hours. CBG: Recent Labs  Lab 02/05/21 0606 02/05/21 1138 02/05/21 1753 02/05/21 2326 02/06/21 0623  GLUCAP 84 92 75 85 77   Lipid Profile:  No results for input(s): CHOL, HDL, LDLCALC, TRIG, CHOLHDL, LDLDIRECT in the last 72 hours. Thyroid Function Tests: No results for input(s): TSH, T4TOTAL, FREET4, T3FREE, THYROIDAB in the last 72 hours. Anemia Panel: No results for input(s): VITAMINB12, FOLATE, FERRITIN, TIBC, IRON, RETICCTPCT in the last 72 hours. Sepsis Labs: No results for input(s): PROCALCITON, LATICACIDVEN in the last 168 hours.  Recent Results (from the past 240 hour(s))  Surgical pcr screen     Status: None   Collection Time: 01/31/21  9:19 PM   Specimen: Nasal Mucosa; Nasal Swab  Result Value Ref Range Status   MRSA, PCR NEGATIVE NEGATIVE Final   Staphylococcus aureus NEGATIVE NEGATIVE Final    Comment: (NOTE) The Xpert SA Assay (FDA approved for NASAL specimens in patients 23 years of age and older), is one component of a  comprehensive surveillance program. It is not intended to diagnose infection nor to guide or monitor treatment. Performed at [facility], [place] [address], [place], [place] [address]          Radiology Studies: No results found.      Scheduled Meds:  Chlorhexidine Gluconate Cloth  6 each Topical Daily   famotidine  20 mg Oral Daily   hydrocortisone cream   Topical QID   hydrOXYzine  25 mg Oral TID   levothyroxine  125 mcg Oral QAC breakfast   metoprolol tartrate  25 mg Oral BID   pantoprazole (PROTONIX) IV  40 mg Intravenous Q12H   PARoxetine  20 mg Oral Daily   Ensure Max Protein  11 oz Oral BID   rivaroxaban  20 mg Oral Q supper   Continuous Infusions:  lactated ringers 10 mL/hr at 02/01/21 1512   magnesium sulfate bolus IVPB 4 g (02/06/21 1248)     LOS: 11 days   Time spent= 35 mins    Alga Southall Arsenio Loader, MD [facility]  If 7PM-7AM, please contact night-coverage  02/06/2021, 1:17 PM

## 2021-02-06 NOTE — Progress Notes (Signed)
Patient ID: Katrina Donaldson, female   DOB: 14-Jan-1956, 65 y.o.   MRN: [id number] 5 Days Post-Op   Subjective: No eating much.  Didn't like the resource breeze so will transition to ensure max today.  No result with suppository but still with flatus.  No nausea.  ROS negative except as listed above. Objective: Vital signs in last 24 hours: Temp:  [98.4 F (36.9 C)-99.1 F (37.3 C)] 98.7 F (37.1 C) (08/09 0338) Pulse Rate:  [72-91] 85 (08/09 0338) Resp:  [17-20] 17 (08/09 0338) BP: (142-152)/(80-97) 142/83 (08/09 0338) SpO2:  [96 %-100 %] 100 % (08/09 0338) Last BM Date: 01/31/21  Intake/Output from previous day: 08/08 0701 - 08/09 0700 In: 1103.3 [I.V.:720; IV Piggyback:383.3] Out: 300 [Urine:300] Intake/Output this shift: No intake/output data recorded.  PE: Abd: softer today and much less bloated.  appropriately tender, great BS, midline incision is c/d/I with a staples  Lab Results: CBC  Recent Labs    02/05/21 0647 02/06/21 0133  WBC 7.1 6.9  HGB 9.8* 9.3*  HCT 31.5* 29.9*  PLT 111* 127*   BMET Recent Labs    02/05/21 0647 02/06/21 0133  NA 138 136  K 3.2* 3.6  CL 105 105  CO2 23 22  GLUCOSE 80 77  BUN <5* <5*  CREATININE 0.56 0.61  CALCIUM 8.1* 8.0*   PT/INR No results for input(s): LABPROT, INR in the last 72 hours. ABG No results for input(s): PHART, HCO3 in the last 72 hours.  Invalid input(s): PCO2, PO2  Studies/Results:   Anti-infectives: Anti-infectives (From admission, onward)    Start     Dose/Rate Route Frequency Ordered Stop   02/01/21 0600  cefoTEtan (CEFOTAN) 2 g in sodium chloride 0.9 % 100 mL IVPB        2 g 200 mL/hr over 30 Minutes Intravenous On call to O.R. 01/31/21 1450 02/01/21 1013       Assessment/Plan: POD 5, S/P laparoscopic assisted R colectomy 8/5 by Dr. Grandville Silos for Cecal mass -path pending still -soft diet  -add shakes as she doesn't have a great appetite currently as somewhat expected -wheelchair bound,  but mobilize as able to chair etc with therapies -incentive spirometer -tolerating full-dose anticoagulation, hgb stable with no bleeding -would like patient to show she can at least drink her calories, but otherwise she is surgically stable and certainly could proceed with looking for placement, etc in regards to dispo  FEN - soft, ensure/IVFs per med VTE - SCDs, xarelto ID - none Foley - out 8/6  H/O DVT  HTN Hypothyroidism Wheelchair bound  LOS: 11 days   Henreitta Cea, PA-C General Surgery Use [email or web address] to contact on call provider  02/06/2021

## 2021-02-06 NOTE — Care Management Important Message (Signed)
Important Message  Patient Details  Name: Katrina Donaldson MRN: [id number] Date of Birth: [birth year]   Medicare Important Message Given:  Yes     [name] 02/06/2021, 9:52 AM

## 2021-02-07 DIAGNOSIS — D649 Anemia, unspecified: Secondary | ICD-10-CM | POA: Diagnosis not present

## 2021-02-07 DIAGNOSIS — K6389 Other specified diseases of intestine: Secondary | ICD-10-CM | POA: Diagnosis not present

## 2021-02-07 LAB — CBC
HCT: 31.2 % — ABNORMAL LOW (ref 36.0–46.0)
Hemoglobin: 9.9 g/dL — ABNORMAL LOW (ref 12.0–15.0)
MCH: 27 pg (ref 26.0–34.0)
MCHC: 31.7 g/dL (ref 30.0–36.0)
MCV: 85 fL (ref 80.0–100.0)
Platelets: 158 10*3/uL (ref 150–400)
RBC: 3.67 MIL/uL — ABNORMAL LOW (ref 3.87–5.11)
RDW: 27.5 % — ABNORMAL HIGH (ref 11.5–15.5)
WBC: 6.8 10*3/uL (ref 4.0–10.5)
nRBC: 0 % (ref 0.0–0.2)

## 2021-02-07 LAB — MAGNESIUM: Magnesium: 2 mg/dL (ref 1.7–2.4)

## 2021-02-07 LAB — BASIC METABOLIC PANEL
Anion gap: 10 (ref 5–15)
BUN: <5 mg/dL — ABNORMAL LOW (ref 8–23)
CO2: 20 mmol/L — ABNORMAL LOW (ref 22–32)
Calcium: 7.9 mg/dL — ABNORMAL LOW (ref 8.9–10.3)
Chloride: 103 mmol/L (ref 98–111)
Creatinine, Ser: 0.56 mg/dL (ref 0.44–1.00)
GFR, Estimated: >60 mL/min (ref >60)
Glucose, Bld: 88 mg/dL (ref 70–99)
Potassium: 3.9 mmol/L (ref 3.5–5.1)
Sodium: 133 mmol/L — ABNORMAL LOW (ref 135–145)

## 2021-02-07 LAB — GLUCOSE, CAPILLARY
Glucose-Capillary: 88 mg/dL (ref 70–99)
Glucose-Capillary: 87 mg/dL (ref 70–99)
Glucose-Capillary: 115 mg/dL — ABNORMAL HIGH (ref 70–99)

## 2021-02-07 MED ORDER — ENSURE ENLIVE PO LIQD
237.0000 mL | Freq: Three times a day (TID) | ORAL | Status: DC
  Administered 2021-02-07 – 2021-02-13 (×9): 237 mL via ORAL

## 2021-02-07 MED ORDER — ADULT MULTIVITAMIN W/MINERALS CH
1.0000 | ORAL_TABLET | Freq: Every day | ORAL | Status: DC
  Administered 2021-02-07 – 2021-02-13 (×7): 1 via ORAL
  Filled 2021-02-07 (×7): qty 1

## 2021-02-07 NOTE — Progress Notes (Signed)
Pt PIV leaking and painful while flushing '@2115'$ . Removed per protocol. Pt kindly refused restart tonight. No scheduled IV medications ordered. Dr. Nevada Crane, on-call for attending service, text-paged regarding pt's refusal '@2120'$ .

## 2021-02-07 NOTE — Progress Notes (Signed)
Nutrition Follow Up  DOCUMENTATION CODES:   Not applicable  INTERVENTION:   Day 7 without bowel movement- recommend scheduled bowel regimen vs PRN  Ensure Enlive po TID, each supplement provides 350 kcal and 20 grams of protein Magic cup TID with meals, each supplement provides 290 kcal and 9 grams of protein MVI with minerals daily   NUTRITION DIAGNOSIS:   Increased nutrient needs related to post-op healing as evidenced by estimated needs.  Ongoing  GOAL:   Patient will meet greater than or equal to 90% of their needs  Not meeting   MONITOR:   Diet advancement, Supplement acceptance, Weight trends, PO intake, Labs, I & O's  REASON FOR ASSESSMENT:   NPO/Clear Liquid Diet    ASSESSMENT:   Patient with PMH significant for SAH, thoracic myelopathy, wheel chair dependent, bedbound, DVT, HTN, IDA, large hiatal hernia with intrathoracic stomach, and anxiety/depression. Presents this admission with GI bleed from cecal mass.  8/5- s/p laparoscopic assisted R colectomy   Diet advanced to Soft. Appetite remains poor. Last meal completion from yesterday documented as 25%. Did not consume breakfast. Having a hard time with supplements. Does not like Boost or Ensure MAX. Transition to complete supplement such as Ensure Enlive as it provides the most kcal/protein.   Admission weight: 89.1 kg  No current weight obtained   UOP: 700 ml x 24 hrs   Medications: PRN miralax and senokot  Labs: Na 133 (L) CBG 77-90  Diet Order:   Diet Order             DIET SOFT Room service appropriate? Yes with Assist; Fluid consistency: Thin  Diet effective now                   EDUCATION NEEDS:   Not appropriate for education at this time  Skin:  Skin Assessment: Skin Integrity Issues: Skin Integrity Issues:: Stage II, Incisions Stage II: coccyx Incisions: abdomen  Last BM:  8/3  Height:   Ht Readings from Last 1 Encounters:  01/31/21 5' 5.5" (1.664 m)    Weight:   Wt  Readings from Last 1 Encounters:  01/31/21 89.1 kg    BMI:  Body mass index is 32.19 kg/m.  Estimated Nutritional Needs:   Kcal:  1800-2000 kcal  Protein:  100-115 grams  Fluid:  >/= 2 L/day  Mariana Single MS, RD, LDN, CNSC Clinical Nutrition Pager listed in [place]

## 2021-02-07 NOTE — Progress Notes (Signed)
"Admitted PROGRESS NOTE    Katrina Donaldson  [id number] DOB: 1956-03-16 DOA: 01/26/2021 PCP: Donald Prose, MD   Brief Narrative:  78 Past medical history of SAH, thoracic myelopathy, wheelchair dependent, bedbound, history of DVT on Xarelto, HTN, IDA, hypothyroidism, anxiety, depression.  Presents with complaints of abnormal lab with hemoglobin of 3.0. Suspect upper GI bleed.  GI consulted.  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  General surgery was consulted for resection.  Patient underwent laparoscopic colectomy on 8/4 by surgery. C scope path- Acute tubulovillous adenoma with high-grade dysplasia.  Awaiting bowel function to return.  TOC consulted for SNF placement.   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  Acute proctitis -Admission hemoglobin 3.0.  Hemoccult positive.  Status post 4 unit PRBC transfusion.  On Soft diet.  Out of bed to chair, mobilize. - PPI twice daily.  Xarelto resumed.  - CTA-negative for acute bleeding - Colonoscopy-showed cecal mass.  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  Path shows a tubular adenoma with high-grade dysplasia, but no malignancy, margins are free of dysplasia - CEA-1.2 -CT chest abdomen pelvis-cecal mass, large hiatal hernia, chronic passive atelectasis, nonobstructing renal stone. No signs of metastatic disease -Diet remains poor, she was encouraged to drink Ensure, no bowel movement yet, but she is passing gas.  Large hiatal hernia with intrathoracic stomach, severe left hemidiaphragm - PPI.  Management per surgery if surgery is indicated  Maculopapular rash;  allergic dermatitis-almost resolved.  - Improved after receiving topical hydrocortisone, Solu-Medrol.  Can continue as needed Atarax if necessary.  Hypokalemia - Repletion    History of anxiety and depression -Continue Paxil   Essential hypertension -Lopressor 25 mg twice daily   History of DVT, 2018 - Xarelto resumed   Hypothyroidism -Synthroid   Obesity Placing the patient at high risk for poor outcome.   Medial coccyx stage II pressure ulcer; POA Continue home dressing.   Remove Foley Central line discontinued on 8/9 TOC consulted for SNF  DVT prophylaxis: Xarelto  Code Status: Full Family Communication:  None at bedside Status is: Inpatient  Remains inpatient appropriate because:Inpatient level of care appropriate due to severity of illness  Dispo:  Patient From: [facility]  Planned Disposition: [facility].  Awaiting bowel function to return, remains with no bowel movement yet, as well remains with poor appetite.    In the meantime awaiting SNF placement.  TOC consulted.  Medically stable for discharge: No         Nutritional status  Nutrition Problem: Increased nutrient needs Etiology: post-op healing  Signs/Symptoms: estimated needs  Interventions: Refer to RD note for recommendations  Body mass index is 32.19 kg/m.  Pressure Injury 01/27/21 Coccyx Medial Stage 2 -  Partial thickness loss of dermis presenting as a shallow open injury with a red, pink wound bed without slough. (Active)  01/27/21 0311  Location: Coccyx  Location Orientation: Medial  Staging: Stage 2 -  Partial thickness loss of dermis presenting as a shallow open injury with a red, pink wound bed without slough.  Wound Description (Comments):   Present on Admission:           Subjective: -Remains  constipated, last bowel movement 8/3, she is passing gas, she refused her MiraLAX this a.m., she remains with poor  appetite.    Examination:  Awake Alert, Oriented X 3, No new F.N deficits, Normal affect Symmetrical Chest wall movement, Good air movement bilaterally, CTAB RRR,No Gallops,Rubs or new Murmurs, No Parasternal Heave +ve B.Sounds, Abd Soft, No tenderness, abdomen surgical wound covered with mesh bandage, no discharge or oozing could be seen through.  No rebound - guarding or rigidity. No Cyanosis, Clubbing or edema, No new Rash or bruise      Objective: Vitals:   02/06/21 1622 02/06/21 2042 02/07/21 0426 02/07/21 0957  BP: (!) 150/90 135/85 (!) 146/100 (!) 145/88  Pulse: 96 (!) 103 95 (!) 102  Resp: '15 15 18 18  '$ Temp: (P) 99.4 F (37.4 C) 98.8 F (37.1 C) 98.9 F (37.2 C) 99.3 F (37.4 C)  TempSrc: Oral Oral Oral Oral  SpO2: 100% 100% 100% 100%  Weight:      Height:        Intake/Output Summary (Last 24 hours) at 02/07/2021 1040 Last data filed at 02/07/2021 0429 Gross per 24 hour  Intake 791.14 ml  Output 700 ml  Net 91.14 ml   Filed Weights   01/27/21 0129 01/29/21 0727 01/31/21 0913  Weight: 89.1 kg 89.1 kg 89.1 kg     Data Reviewed:   CBC: Recent Labs  Lab 02/03/21 0505 02/04/21 0620 02/05/21 0647 02/06/21 0133 02/07/21 0604  WBC 9.3 6.4 7.1 6.9 6.8  HGB 9.1* 9.6* 9.8* 9.3* 9.9*  HCT 29.3* 30.3* 31.5* 29.9* 31.2*  MCV 86.2 85.4 84.9 86.9 85.0  PLT 113* 103* 111* 127* 0000000   Basic Metabolic Panel: Recent Labs  Lab 02/03/21 0505 02/04/21 0620 02/05/21 0647 02/06/21 0133 02/07/21 0604  NA 141 139 138 136 133*  K 3.0* 3.8 3.2* 3.6 3.9  CL 108 108 105 105 103  CO2 '27 22 23 22 '$ 20*  GLUCOSE 93 85 80 77 88  BUN 5* 5* <5* <5* <5*  CREATININE 0.61 0.56 0.56 0.61 0.56  CALCIUM 8.1* 8.0* 8.1* 8.0* 7.9*  MG 1.6* 2.0 1.8 1.6* 2.0   GFR: Estimated Creatinine Clearance: 76.1 mL/min (by C-G formula based on SCr of 0.56 mg/dL). Liver Function Tests: No results for input(s): AST, ALT, ALKPHOS, BILITOT, PROT, ALBUMIN in the last 168 hours.  No results for  input(s): LIPASE, AMYLASE in the last 168 hours. No results for input(s): AMMONIA in the last 168 hours. Coagulation Profile: No results for input(s): INR, PROTIME in the last 168 hours. Cardiac Enzymes: No results for input(s): CKTOTAL, CKMB, CKMBINDEX, TROPONINI in the last 168 hours. BNP (last 3 results) No results for input(s): PROBNP in the last 8760 hours. HbA1C: No results for input(s): HGBA1C in the last 72 hours. CBG: Recent Labs  Lab 02/05/21 2326 02/06/21 0623 02/06/21 1751 02/06/21 2041 02/07/21 0644  GLUCAP 85 77 89 90 88   Lipid Profile: No results for input(s): CHOL, HDL, LDLCALC, TRIG, CHOLHDL, LDLDIRECT in the last 72 hours. Thyroid Function Tests: No results for input(s): TSH, T4TOTAL, FREET4, T3FREE, THYROIDAB in the last 72 hours. Anemia Panel: No results for input(s): VITAMINB12, FOLATE, FERRITIN, TIBC, IRON, RETICCTPCT in the last 72 hours. Sepsis Labs: No results for input(s): PROCALCITON, LATICACIDVEN in the last 168 hours.  Recent Results (from the past 240 hour(s))  Surgical pcr screen     Status: None   Collection Time: 01/31/21  9:19 PM   Specimen: Nasal Mucosa; Nasal Swab  Result Value Ref Range Status   MRSA, PCR NEGATIVE NEGATIVE Final   Staphylococcus aureus NEGATIVE NEGATIVE Final    Comment: (NOTE) The Xpert SA Assay (FDA approved for NASAL specimens in patients 87 years of age and older), is one component of a comprehensive surveillance program. It is not intended to diagnose infection nor to guide or monitor treatment. Performed at [facility], [place] [address], [place], [place] [address]          Radiology Studies: No results found.      Scheduled Meds:  Chlorhexidine Gluconate Cloth  6 each Topical Daily   famotidine  20 mg Oral Daily   hydrocortisone cream   Topical QID   hydrOXYzine  25 mg Oral TID   levothyroxine  125 mcg Oral QAC breakfast   metoprolol tartrate  25 mg Oral BID   pantoprazole  40 mg Oral BID    PARoxetine  20 mg Oral Daily   Ensure Max Protein  11 oz Oral BID   rivaroxaban  20 mg Oral Q supper   Continuous Infusions:  lactated ringers 10 mL/hr at 02/01/21 1512     LOS: 12 days    Phillips Climes, MD [facility]  If 7PM-7AM, please contact night-coverage  02/07/2021, 10:40 AM

## 2021-02-07 NOTE — Progress Notes (Signed)
Occupational Therapy Treatment Patient Details Name: Katrina Donaldson MRN: [id number] DOB: July 02, 1955 Today's Date: 02/07/2021    History of present illness 65 yo female presents to [facility] on 7/29 with n/v from [facility], hgb found to be 3.0 and received PRBC. 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 PMH Includes prior [facility] 2016, HTN, chronic anticoagulation on Xarelto, DVT, depression, UTI, brain aneurysm repair, thoracic laminectomy 2018.   OT comments  Patient initially agreeable to sit edge of bed but refused mid transfer - needing heavy assist for LEs. Patient did exhibit improved ability to roll left and right - needing min assist and use of bed rails. Patient refused to complete transfer or attempt getting to the chair. Therapist enquired about patient's goals and prior level. Though patient reports she was standing with a stedy prior to admission - she would not commit to getting out of bed or improving functional mobility.At times patient would not answer and look past therapist at the tv. Questionable rehab potential due to poor motivation. Will attempt therapy again but may sign off due to poor participation. Recommend return to SNF as she needs 24/7 assistance with bed mobility and ADLs.   Follow Up Recommendations  SNF    Equipment Recommendations  None recommended by OT    Recommendations for Other Services      Precautions / Restrictions Precautions Precautions: Fall Restrictions Weight Bearing Restrictions: No       Mobility Bed Mobility Overal bed mobility: Needs Assistance Bed Mobility: Rolling Rolling: Min assist         General bed mobility comments: initially agreeable to sit edge of bed and performed roling to the right with therapist providing assistance for the legs. Refused to elevate trunk reporting "Can't we do  this later." Patient refused to continue stating "I told you guys this hurts" though she did not report pain to therapist or exhibit pain symptoms. Patient returned to supine. [place] was able to roll left and right with min assist to fix pad underneath her - which shoes some improvement. However despite encouragement from therapist and nurse tech refused to get out of bed.    Transfers                      Balance                                           ADL either performed or assessed with clinical judgement   ADL                                               Vision Patient Visual Report: No change from baseline     Perception     Praxis      Cognition Arousal/Alertness: Awake/alert Behavior During Therapy: Flat affect Overall Cognitive Status: Within Functional Limits for tasks assessed                                 General Comments: patient does not respond at times when speaking to her and loos past therapist at the  tv        Exercises     Shoulder Instructions       General Comments      Pertinent Vitals/ Pain       Pain Assessment: Faces Faces Pain Scale: Hurts a little bit Pain Location: abdomen Pain Intervention(s): Limited activity within patient's tolerance  Home Living                                          Prior Functioning/Environment              Frequency           Progress Toward Goals  OT Goals(current goals can now be found in the care plan section)  Progress towards OT goals: Progressing toward goals  Acute Rehab OT Goals Patient Stated Goal: did not state any goals Time For Goal Achievement: 02/14/21 Potential to Achieve Goals: [place] Discharge plan remains appropriate    Co-evaluation          OT goals addressed during session: Other (comment) (functional mobility)      AM-PAC OT "6 Clicks" Daily Activity     Outcome  Measure   Help from another person eating meals?: None Help from another person taking care of personal grooming?: A Little Help from another person toileting, which includes using toliet, bedpan, or urinal?: Total Help from another person bathing (including washing, rinsing, drying)?: A Lot Help from another person to put on and taking off regular upper body clothing?: A Little Help from another person to put on and taking off regular lower body clothing?: Total 6 Click Score: 14    End of Session    OT Visit Diagnosis: Unsteadiness on feet (R26.81);Muscle weakness (generalized) (M62.81);Pain;Other symptoms and signs involving cognitive function   Activity Tolerance Patient limited by pain   Patient Left in bed;with call bell/phone within reach   Nurse Communication Mobility status        Time: 1101-1120 OT Time Calculation (min): 19 min  Charges: OT General Charges $OT Visit: 1 Visit OT Treatments $Therapeutic Activity: 8-22 mins  Derl Barrow, OTR/L [place]  Office [phone] Pager: [place] 02/07/2021, 11:35 AM

## 2021-02-07 NOTE — Progress Notes (Signed)
Patient ID: Katrina Donaldson, female   DOB: 05/10/1956, 65 y.o.   MRN: [id number] 6 Days Post-Op   Subjective: No acute changes. Passing flatus but no BM.   Objective: Vital signs in last 24 hours: Temp:  [98 F (36.7 C)-99.4 F (37.4 C)] 98.9 F (37.2 C) (08/10 0426) Pulse Rate:  [89-103] 95 (08/10 0426) Resp:  [15-18] 18 (08/10 0426) BP: (135-152)/(85-100) 146/100 (08/10 0426) SpO2:  [100 %] 100 % (08/10 0426) Last BM Date: 01/31/21  Intake/Output from previous day: 08/09 0701 - 08/10 0700 In: 791.1 [P.O.:240; I.V.:240; IV Piggyback:311.1] Out: 700 [Urine:700] Intake/Output this shift: No intake/output data recorded.  PE: Abd: softer, nondistended, nontender. Midline incision is c/d/I with a staples  Lab Results: CBC  Recent Labs    02/06/21 0133 02/07/21 0604  WBC 6.9 6.8  HGB 9.3* 9.9*  HCT 29.9* 31.2*  PLT 127* 158   BMET Recent Labs    02/06/21 0133 02/07/21 0604  NA 136 133*  K 3.6 3.9  CL 105 103  CO2 22 20*  GLUCOSE 77 88  BUN <5* <5*  CREATININE 0.61 0.56  CALCIUM 8.0* 7.9*   PT/INR No results for input(s): LABPROT, INR in the last 72 hours. ABG No results for input(s): PHART, HCO3 in the last 72 hours.  Invalid input(s): PCO2, PO2  Studies/Results:   Anti-infectives: Anti-infectives (From admission, onward)    Start     Dose/Rate Route Frequency Ordered Stop   02/01/21 0600  cefoTEtan (CEFOTAN) 2 g in sodium chloride 0.9 % 100 mL IVPB        2 g 200 mL/hr over 30 Minutes Intravenous On call to O.R. 01/31/21 1450 02/01/21 1013       Assessment/Plan: POD 6, S/P laparoscopic assisted R colectomy 8/5 by Dr. 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

## 2021-02-07 NOTE — Progress Notes (Signed)
Pt refused take miralax. RN educated pt about benefit of take the med and lastest bm was 8/3 but pt kept refusing. MD notified.   Lavenia Atlas, RN

## 2021-02-08 DIAGNOSIS — K6389 Other specified diseases of intestine: Secondary | ICD-10-CM | POA: Diagnosis not present

## 2021-02-08 DIAGNOSIS — K922 Gastrointestinal hemorrhage, unspecified: Secondary | ICD-10-CM | POA: Diagnosis not present

## 2021-02-08 DIAGNOSIS — D649 Anemia, unspecified: Secondary | ICD-10-CM | POA: Diagnosis not present

## 2021-02-08 LAB — CBC
HCT: 30.5 % — ABNORMAL LOW (ref 36.0–46.0)
Hemoglobin: 9.5 g/dL — ABNORMAL LOW (ref 12.0–15.0)
MCH: 26.8 pg (ref 26.0–34.0)
MCHC: 31.1 g/dL (ref 30.0–36.0)
MCV: 86.2 fL (ref 80.0–100.0)
Platelets: 202 10*3/uL (ref 150–400)
RBC: 3.54 MIL/uL — ABNORMAL LOW (ref 3.87–5.11)
RDW: 27.3 % — ABNORMAL HIGH (ref 11.5–15.5)
WBC: 6.1 10*3/uL (ref 4.0–10.5)
nRBC: 0 % (ref 0.0–0.2)

## 2021-02-08 LAB — BASIC METABOLIC PANEL
Anion gap: 7 (ref 5–15)
BUN: <5 mg/dL — ABNORMAL LOW (ref 8–23)
CO2: 24 mmol/L (ref 22–32)
Calcium: 8.1 mg/dL — ABNORMAL LOW (ref 8.9–10.3)
Chloride: 105 mmol/L (ref 98–111)
Creatinine, Ser: 0.58 mg/dL (ref 0.44–1.00)
GFR, Estimated: >60 mL/min (ref >60)
Glucose, Bld: 93 mg/dL (ref 70–99)
Potassium: 3.6 mmol/L (ref 3.5–5.1)
Sodium: 136 mmol/L (ref 135–145)

## 2021-02-08 LAB — GLUCOSE, CAPILLARY
Glucose-Capillary: 103 mg/dL — ABNORMAL HIGH (ref 70–99)
Glucose-Capillary: 86 mg/dL (ref 70–99)
Glucose-Capillary: 102 mg/dL — ABNORMAL HIGH (ref 70–99)

## 2021-02-08 LAB — MAGNESIUM: Magnesium: 1.9 mg/dL (ref 1.7–2.4)

## 2021-02-08 MED ORDER — POLYETHYLENE GLYCOL 3350 17 G PO PACK
17.0000 g | PACK | Freq: Every day | ORAL | Status: DC
  Administered 2021-02-08 – 2021-02-09 (×2): 17 g via ORAL
  Filled 2021-02-08 (×2): qty 1

## 2021-02-08 NOTE — Plan of Care (Signed)
  Problem: Education: Goal: Knowledge of General Education information will improve Description Including pain rating scale, medication(s)/side effects and non-pharmacologic comfort measures Outcome: Progressing   

## 2021-02-08 NOTE — Progress Notes (Signed)
Patient ID: Katrina Donaldson, female   DOB: 1955/09/14, 65 y.o.   MRN: [id number] 7 Days Post-Op   Subjective: No acute changes. Passing flatus but no BM.  Refused miralax yesterday   Objective: Vital signs in last 24 hours: Temp:  [97.9 F (36.6 C)-99.3 F (37.4 C)] 98.2 F (36.8 C) (08/11 0840) Pulse Rate:  [90-102] 98 (08/11 0840) Resp:  [18-20] 20 (08/11 0840) BP: (132-150)/(83-88) 132/87 (08/11 0840) SpO2:  [92 %-100 %] 100 % (08/11 0840) Last BM Date: 01/31/21  Intake/Output from previous day: 08/10 0701 - 08/11 0700 In: 220 [P.O.:220] Out: 350 [Urine:350] Intake/Output this shift: No intake/output data recorded.  PE: Abd: soft, nondistended, nontender. Midline incision is c/d/I with a staples.  Honeycomb dressing removed today.  Lab Results: CBC  Recent Labs    02/07/21 0604 02/08/21 0113  WBC 6.8 6.1  HGB 9.9* 9.5*  HCT 31.2* 30.5*  PLT 158 202   BMET Recent Labs    02/07/21 0604 02/08/21 0113  NA 133* 136  K 3.9 3.6  CL 103 105  CO2 20* 24  GLUCOSE 88 93  BUN <5* <5*  CREATININE 0.56 0.58  CALCIUM 7.9* 8.1*   PT/INR No results for input(s): LABPROT, INR in the last 72 hours. ABG No results for input(s): PHART, HCO3 in the last 72 hours.  Invalid input(s): PCO2, PO2  Studies/Results:   Anti-infectives: Anti-infectives (From admission, onward)    Start     Dose/Rate Route Frequency Ordered Stop   02/01/21 0600  cefoTEtan (CEFOTAN) 2 g in sodium chloride 0.9 % 100 mL IVPB        2 g 200 mL/hr over 30 Minutes Intravenous On call to O.R. 01/31/21 1450 02/01/21 1013       Assessment/Plan: POD 7, S/P laparoscopic assisted R colectomy 8/5 by Dr. 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  Will need GI follow up for repeat c-scope likely in 3 years. Spoke with Dr. Hilarie Fredrickson today and he will put her on their recall list for 3 years. -soft diet, Ensure,  miralax -wheelchair bound, but mobilize as able to chair etc with therapies -incentive spirometer -tolerating full-dose anticoagulation, hgb stable -Continue to encourage PO intake, dispo planning -follow up being arranged, will DC staples on POD 10  FEN - soft, ensure, miralax VTE - SCDs, xarelto ID - none Foley - out 8/6  H/O DVT  HTN Hypothyroidism Wheelchair bound  LOS: 13 days   Henreitta Cea, MD General Surgery Use [email or web address] to contact on call provider  02/08/2021

## 2021-02-08 NOTE — Discharge Instructions (Signed)
[facility]      [facility] [phone]  OPEN ABDOMINAL SURGERY: POST OP INSTRUCTIONS  Always review your discharge instruction sheet given to you by the facility where your surgery was performed.  IF YOU HAVE DISABILITY OR FAMILY LEAVE FORMS, YOU MUST BRING THEM TO THE OFFICE FOR PROCESSING.  PLEASE DO NOT GIVE THEM TO YOUR DOCTOR.  A prescription for pain medication may be given to you upon discharge.  Take your pain medication as prescribed, if needed.  If narcotic pain medicine is not needed, then you may take acetaminophen (Tylenol) or ibuprofen (Advil) as needed. Take your usually prescribed medications unless otherwise directed. If you need a refill on your pain medication, please contact your pharmacy. They will contact our office to request authorization.  Prescriptions will not be filled after 5pm or on week-ends. You should follow a light diet the first few days after arrival home, such as soup and crackers, pudding, etc.unless your doctor has advised otherwise. A high-fiber, low fat diet can be resumed as tolerated.   Be sure to include lots of fluids daily. Most patients will experience some swelling and bruising on the chest and neck area.  Ice packs will help.  Swelling and bruising can take several days to resolve Most patients will experience some swelling and bruising in the area of the incision. Ice pack will help. Swelling and bruising can take several days to resolve..  It is common to experience some constipation if taking pain medication after surgery.  Increasing fluid intake and taking a stool softener will usually help or prevent this problem from occurring.  A mild laxative (Milk of Magnesia or Miralax) should be taken according to package directions if there are no bowel movements after 48 hours.  You may have steri-strips (small skin tapes) in place directly over the incision.  These strips should be left on the skin for 7-10 days.  If your surgeon used skin  glue on the incision, you may shower in 24 hours.  The glue will flake off over the next 2-3 weeks.  Any sutures or staples will be removed at the office during your follow-up visit. You may find that a light gauze bandage over your incision may keep your staples from being rubbed or pulled. You may shower and replace the bandage daily. ACTIVITIES:  You may resume regular (light) daily activities beginning the next day--such as daily self-care, walking, climbing stairs--gradually increasing activities as tolerated.  You may have sexual intercourse when it is comfortable.  Refrain from any heavy lifting or straining until approved by your doctor. You may drive when you no longer are taking prescription pain medication, you can comfortably wear a seatbelt, and you can safely maneuver your car and apply brakes Return to Work: ___________________________________ You should see your doctor in the office for a follow-up appointment approximately two weeks after your surgery.  Make sure that you call for this appointment within a day or two after you arrive home to insure a convenient appointment time. OTHER INSTRUCTIONS:  _____________________________________________________________ _____________________________________________________________  WHEN TO CALL YOUR DOCTOR: Fever over 101.0 Inability to urinate Nausea and/or vomiting Extreme swelling or bruising Continued bleeding from incision. Increased pain, redness, or drainage from the incision. Difficulty swallowing or breathing Muscle cramping or spasms. Numbness or tingling in hands or feet or around lips.  The clinic staff is available to answer your questions during regular business hours.  Please don't hesitate to call and ask to speak to one of   the nurses if you have concerns.  For further questions, please visit [email or web address]  

## 2021-02-08 NOTE — Progress Notes (Addendum)
"Admitted PROGRESS NOTE    Katrina Donaldson  [id number] DOB: 1956/06/14 DOA: 01/26/2021 PCP: Donald Prose, MD   Brief Narrative:   62 Past medical history of SAH, thoracic myelopathy, wheelchair dependent, bedbound, history of DVT on Xarelto, HTN, IDA, hypothyroidism, anxiety, depression.  Presents with complaints of abnormal lab with hemoglobin of 3.0. Suspect upper GI bleed.  GI consulted.  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  General surgery was consulted for resection.  Patient underwent laparoscopic colectomy on 8/4 by surgery. C scope path- Acute tubulovillous adenoma with high-grade dysplasia.  Awaiting bowel function to return.  TOC consulted for SNF placement.   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  Acute proctitis -Admission hemoglobin 3.0.  Hemoccult positive.  Status post 4 unit PRBC transfusion.  On Soft diet.  Out of bed to chair, mobilize. - PPI twice daily.  Xarelto resumed.  - CTA-negative for acute bleeding - Colonoscopy-showed cecal mass.  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  Path shows a tubular adenoma with high-grade dysplasia, but no malignancy, margins are free of dysplasia - CEA-1.2 -CT chest abdomen pelvis-cecal mass, large hiatal hernia, chronic passive atelectasis, nonobstructing renal stone. No signs of metastatic disease -Start remains poor, still no bowel movement after surgery, most recent was 8/3, she received suppository yesterday, will start on scheduled MiraLAX, she was encouraged to set up and ambulate, she was encouraged at least to drink her Ensure and Magic cups.     Large hiatal hernia with intrathoracic stomach, severe left hemidiaphragm - PPI.  Management per surgery if surgery is indicated  Maculopapular rash; allergic dermatitis-almost resolved.  - Improved after receiving topical hydrocortisone, Solu-Medrol.  Can continue as needed Atarax if necessary.  Hypokalemia - Repletion    History of anxiety and depression -Continue Paxil   Essential hypertension -Lopressor 25 mg twice daily   History of DVT, 2018 - Xarelto resumed   Hypothyroidism -Synthroid   Obesity Placing the patient at high risk for poor outcome.   Medial coccyx stage II pressure ulcer; POA Continue home dressing.   Remove Foley Central line discontinued on 8/9 TOC consulted for SNF  DVT prophylaxis: Xarelto  Code Status: Full Family Communication:  None at bedside Status is: Inpatient  Remains inpatient appropriate because:Inpatient level of care appropriate due to severity of illness  Dispo:  Patient From: [place]  Planned Disposition: [place].  Awaiting bowel function to return, remains with no bowel movement yet, as well remains with poor appetite.    In the meantime awaiting SNF placement.  TOC consulted.  Medically stable for discharge: No         Nutritional status  Nutrition Problem: Increased nutrient needs Etiology: post-op healing  Signs/Symptoms: estimated needs  Interventions: Refer to RD note for recommendations  Body mass index is 32.19 kg/m.  Pressure Injury 01/27/21 Coccyx Medial Stage 2 -  Partial thickness loss of dermis presenting as a shallow open injury with a red, pink wound bed without slough. (Active)  01/27/21 0311  Location: Coccyx  Location Orientation: Medial  Staging: Stage 2 -  Partial thickness loss of dermis presenting as a shallow open injury with a  red, pink wound bed without slough.  Wound Description (Comments):   Present on Admission:           Subjective: -No  significant events overnight as discussed with staff, patient still reports poor appetite, no bowel movement yet, but she is passing gas, reports she will take her MiraLAX today.      Examination:   awake Alert, Oriented X 3, No new F.N deficits, Normal affect Symmetrical Chest wall movement, Good air movement bilaterally, CTAB RRR,No Gallops,Rubs or new Murmurs, No Parasternal Heave +ve B.Sounds, Abd Soft, midline incision is C/D/I with a staples No Cyanosis, Clubbing or edema, No new Rash or bruise       Objective: Vitals:   02/07/21 2000 02/07/21 2325 02/08/21 0432 02/08/21 0840  BP: 139/85 132/83 132/85 132/87  Pulse:  97 90 98  Resp: '20 20 20 20  '$ Temp: 99.2 F (37.3 C) 99.2 F (37.3 C) 98.1 F (36.7 C) 98.2 F (36.8 C)  TempSrc: Oral Oral Oral Oral  SpO2: 100% 93% 100% 100%  Weight:      Height:        Intake/Output Summary (Last 24 hours) at 02/08/2021 1114 Last data filed at 02/07/2021 2000 Gross per 24 hour  Intake 220 ml  Output 350 ml  Net -130 ml   Filed Weights   01/27/21 0129 01/29/21 0727 01/31/21 0913  Weight: 89.1 kg 89.1 kg 89.1 kg     Data Reviewed:   CBC: Recent Labs  Lab 02/04/21 0620 02/05/21 0647 02/06/21 0133 02/07/21 0604 02/08/21 0113  WBC 6.4 7.1 6.9 6.8 6.1  HGB 9.6* 9.8* 9.3* 9.9* 9.5*  HCT 30.3* 31.5* 29.9* 31.2* 30.5*  MCV 85.4 84.9 86.9 85.0 86.2  PLT 103* 111* 127* 158 [id number]   Basic Metabolic Panel: Recent Labs  Lab 02/04/21 0620 02/05/21 0647 02/06/21 0133 02/07/21 0604 02/08/21 0113  NA 139 138 136 133* 136  K 3.8 3.2* 3.6 3.9 3.6  CL 108 105 105 103 105  CO2 '22 23 22 '$ 20* 24  GLUCOSE 85 80 77 88 93  BUN 5* <5* <5* <5* <5*  CREATININE 0.56 0.56 0.61 0.56 0.58  CALCIUM 8.0* 8.1* 8.0* 7.9* 8.1*  MG 2.0 1.8 1.6* 2.0 1.9   GFR: Estimated Creatinine Clearance: 76.1 mL/min (by C-G formula based on SCr of 0.58 mg/dL). Liver Function Tests: No results for input(s): AST, ALT, ALKPHOS, BILITOT, PROT, ALBUMIN in the  last 168 hours.  No results for input(s): LIPASE, AMYLASE in the last 168 hours. No results for input(s): AMMONIA in the last 168 hours. Coagulation Profile: No results for input(s): INR, PROTIME in the last 168 hours. Cardiac Enzymes: No results for input(s): CKTOTAL, CKMB, CKMBINDEX, TROPONINI in the last 168 hours. BNP (last 3 results) No results for input(s): PROBNP in the last 8760 hours. HbA1C: No results for input(s): HGBA1C in the last 72 hours. CBG: Recent Labs  Lab 02/06/21 2041 02/07/21 0644 02/07/21 1132 02/07/21 2329 02/08/21 0639  GLUCAP 90 88 87 115* 103*   Lipid Profile: No results for input(s): CHOL, HDL, LDLCALC, TRIG, CHOLHDL, LDLDIRECT in the last 72 hours. Thyroid Function Tests: No results for input(s): TSH, T4TOTAL, FREET4, T3FREE, THYROIDAB in the last 72 hours. Anemia Panel: No results for input(s): VITAMINB12, FOLATE, FERRITIN, TIBC, IRON, RETICCTPCT in the last 72 hours. Sepsis Labs: No results for input(s): PROCALCITON, LATICACIDVEN in the last 168 hours.  Recent Results (from the past 240 hour(s))  Surgical pcr screen     Status:  None   Collection Time: 01/31/21  9:19 PM   Specimen: Nasal Mucosa; Nasal Swab  Result Value Ref Range Status   MRSA, PCR NEGATIVE NEGATIVE Final   Staphylococcus aureus NEGATIVE NEGATIVE Final    Comment: (NOTE) The Xpert SA Assay (FDA approved for NASAL specimens in patients 36 years of age and older), is one component of a comprehensive surveillance program. It is not intended to diagnose infection nor to guide or monitor treatment. Performed at [facility], [place] [address] [place], [place] [address]          Radiology Studies: No results found.      Scheduled Meds:  famotidine  20 mg Oral Daily   feeding supplement  237 mL Oral TID BM   hydrocortisone cream   Topical QID   hydrOXYzine  25 mg Oral TID   levothyroxine  125 mcg Oral QAC breakfast   metoprolol tartrate  25 mg Oral BID    multivitamin with minerals  1 tablet Oral Daily   pantoprazole  40 mg Oral BID   PARoxetine  20 mg Oral Daily   polyethylene glycol  17 g Oral Daily   rivaroxaban  20 mg Oral Q supper   Continuous Infusions:  lactated ringers 10 mL/hr at 02/01/21 1512     LOS: 13 days    Phillips Climes, MD [facility]  If 7PM-7AM, please contact night-coverage  02/08/2021, 11:14 AM

## 2021-02-09 DIAGNOSIS — K922 Gastrointestinal hemorrhage, unspecified: Secondary | ICD-10-CM | POA: Diagnosis not present

## 2021-02-09 DIAGNOSIS — K6389 Other specified diseases of intestine: Secondary | ICD-10-CM | POA: Diagnosis not present

## 2021-02-09 LAB — BASIC METABOLIC PANEL
Anion gap: 7 (ref 5–15)
BUN: <5 mg/dL — ABNORMAL LOW (ref 8–23)
CO2: 25 mmol/L (ref 22–32)
Calcium: 7.9 mg/dL — ABNORMAL LOW (ref 8.9–10.3)
Chloride: 105 mmol/L (ref 98–111)
Creatinine, Ser: 0.59 mg/dL (ref 0.44–1.00)
GFR, Estimated: >60 mL/min (ref >60)
Glucose, Bld: 93 mg/dL (ref 70–99)
Potassium: 3.4 mmol/L — ABNORMAL LOW (ref 3.5–5.1)
Sodium: 137 mmol/L (ref 135–145)

## 2021-02-09 LAB — CBC
HCT: 30.2 % — ABNORMAL LOW (ref 36.0–46.0)
Hemoglobin: 9.2 g/dL — ABNORMAL LOW (ref 12.0–15.0)
MCH: 26.3 pg (ref 26.0–34.0)
MCHC: 30.5 g/dL (ref 30.0–36.0)
MCV: 86.3 fL (ref 80.0–100.0)
Platelets: 238 10*3/uL (ref 150–400)
RBC: 3.5 MIL/uL — ABNORMAL LOW (ref 3.87–5.11)
RDW: 27.3 % — ABNORMAL HIGH (ref 11.5–15.5)
WBC: 6 10*3/uL (ref 4.0–10.5)
nRBC: 0 % (ref 0.0–0.2)

## 2021-02-09 LAB — GLUCOSE, CAPILLARY
Glucose-Capillary: 88 mg/dL (ref 70–99)
Glucose-Capillary: 79 mg/dL (ref 70–99)

## 2021-02-09 LAB — MAGNESIUM: Magnesium: 1.7 mg/dL (ref 1.7–2.4)

## 2021-02-09 MED ORDER — MAGNESIUM OXIDE -MG SUPPLEMENT 400 (240 MG) MG PO TABS
400.0000 mg | ORAL_TABLET | Freq: Two times a day (BID) | ORAL | Status: AC
  Administered 2021-02-09 (×2): 400 mg via ORAL
  Filled 2021-02-09 (×2): qty 1

## 2021-02-09 MED ORDER — POTASSIUM CHLORIDE CRYS ER 20 MEQ PO TBCR
40.0000 meq | EXTENDED_RELEASE_TABLET | Freq: Once | ORAL | Status: AC
  Administered 2021-02-09: 40 meq via ORAL
  Filled 2021-02-09: qty 2

## 2021-02-09 MED ORDER — MAGNESIUM SULFATE IN D5W 1-5 GM/100ML-% IV SOLN
1.0000 g | Freq: Once | INTRAVENOUS | Status: DC
  Filled 2021-02-09: qty 100

## 2021-02-09 MED ORDER — POLYETHYLENE GLYCOL 3350 17 G PO PACK
17.0000 g | PACK | Freq: Two times a day (BID) | ORAL | Status: AC
  Administered 2021-02-09 – 2021-02-11 (×4): 17 g via ORAL
  Filled 2021-02-09 (×4): qty 1

## 2021-02-09 NOTE — Progress Notes (Signed)
"Admitted PROGRESS NOTE    Katrina Donaldson  [id number] DOB: [birth year] DOA: 01/26/2021 PCP: Donald Prose, MD   Brief Narrative:   15 Past medical history of SAH, thoracic myelopathy, wheelchair dependent, bedbound, history of DVT on Xarelto, HTN, IDA, hypothyroidism, anxiety, depression.  Presents with complaints of abnormal lab with hemoglobin of 3.0. Suspect upper GI bleed.  GI consulted.  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  General surgery was consulted for resection.  Patient underwent laparoscopic colectomy on 8/4 by surgery. C scope path- Acute tubulovillous adenoma with high-grade dysplasia.  Awaiting bowel function to return.  TOC consulted for SNF placement.   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  Acute proctitis -Admission hemoglobin 3.0.  Hemoccult positive.  Status post 4 unit PRBC transfusion.  On Soft diet.  Out of bed to chair, mobilize. - PPI twice daily.  Xarelto resumed.  - CTA-negative for acute bleeding - Colonoscopy-showed cecal mass.  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  Path shows a tubular adenoma with high-grade dysplasia, but no malignancy, margins are free of dysplasia - CEA-1.2 -CT chest abdomen pelvis-cecal mass, large hiatal hernia, chronic passive atelectasis, nonobstructing renal stone. No signs of metastatic disease -She remains with no bowel movement since surgery, more recent BM was on 8/3, will increase his MiraLAX to twice daily . -Diet remains poor, she was encouraged to drink Ensure .  Large hiatal hernia with intrathoracic stomach, severe left hemidiaphragm -  PPI.  Management per surgery if surgery is indicated  Maculopapular rash; allergic dermatitis-almost resolved.  - Improved after receiving topical hydrocortisone, Solu-Medrol.  Can continue as needed Atarax if necessary.  Hypokalemia -Low at 3.4 today, repleted.    History of anxiety and depression -Continue Paxil   Essential hypertension -Lopressor 25 mg twice daily   History of DVT, 2018 - Xarelto resumed   Hypothyroidism -Synthroid   Obesity Placing the patient at high risk for poor outcome.   Medial coccyx stage II pressure ulcer; POA Continue home dressing.   Remove Foley Central line discontinued on 8/9 TOC consulted for SNF  DVT prophylaxis: Xarelto  Code Status: Full Family Communication:  None at bedside Status is: Inpatient  Remains inpatient appropriate because:Inpatient level of care appropriate due to severity of illness  Dispo:  Patient From: [place]  Planned Disposition: [place].  Awaiting bowel function to return, remains with no bowel movement yet, as well remains with poor appetite.    In the meantime awaiting SNF placement.  TOC consulted.  Medically stable for discharge: No         Nutritional status  Nutrition Problem: Increased nutrient needs Etiology: post-op healing  Signs/Symptoms: estimated needs  Interventions: Refer to RD note for recommendations  Body mass index is 32.19 kg/m.  Pressure Injury 01/27/21 Coccyx Medial Stage 2 -  Partial thickness loss of dermis presenting as a shallow open injury with a red, pink wound bed without slough. (Active)  01/27/21 0311  Location: Coccyx  Location Orientation: Medial  Staging: Stage 2 -  Partial thickness loss of dermis presenting as a shallow open injury with a red, pink wound bed without slough.  Wound Description (  Comments):   Present on Admission:           Subjective: -No significant event overnight as discussed with staff, she lost  her IV access yesterday and refused new one, he has no bowel movement yet   Examination:  Awake, alert, frail, in no apparent distress  Symmetrical Chest wall movement, Good air movement bilaterally, CTAB RRR,No Gallops,Rubs or new Murmurs, No Parasternal Heave +ve B.Sounds, Abd Soft, Surgical incision with staples C/D/I No Cyanosis, Clubbing or edema, No new Rash or bruise     Objective: Vitals:   02/08/21 1630 02/08/21 1930 02/09/21 0021 02/09/21 0351  BP: (!) 159/91 (!) 174/96 134/88 132/77  Pulse: 90 96 87 89  Resp: '18 18 16 18  '$ Temp: 98.8 F (37.1 C) 98.4 F (36.9 C) 98.2 F (36.8 C) 99.2 F (37.3 C)  TempSrc: Oral Oral Oral Oral  SpO2: 100% 97% 96% 100%  Weight:      Height:        Intake/Output Summary (Last 24 hours) at 02/09/2021 1209 Last data filed at 02/08/2021 1300 Gross per 24 hour  Intake 120 ml  Output --  Net 120 ml   Filed Weights   01/27/21 0129 01/29/21 0727 01/31/21 0913  Weight: 89.1 kg 89.1 kg 89.1 kg     Data Reviewed:   CBC: Recent Labs  Lab 02/05/21 0647 02/06/21 0133 02/07/21 0604 02/08/21 0113 02/09/21 0101  WBC 7.1 6.9 6.8 6.1 6.0  HGB 9.8* 9.3* 9.9* 9.5* 9.2*  HCT 31.5* 29.9* 31.2* 30.5* 30.2*  MCV 84.9 86.9 85.0 86.2 86.3  PLT 111* 127* 158 202 [id number]   Basic Metabolic Panel: Recent Labs  Lab 02/05/21 0647 02/06/21 0133 02/07/21 0604 02/08/21 0113 02/09/21 0101  NA 138 136 133* 136 137  K 3.2* 3.6 3.9 3.6 3.4*  CL 105 105 103 105 105  CO2 23 22 20* 24 25  GLUCOSE 80 77 88 93 93  BUN <5* <5* <5* <5* <5*  CREATININE 0.56 0.61 0.56 0.58 0.59  CALCIUM 8.1* 8.0* 7.9* 8.1* 7.9*  MG 1.8 1.6* 2.0 1.9 1.7   GFR: Estimated Creatinine Clearance: 76.1 mL/min (by C-G formula based on SCr of 0.59 mg/dL). Liver Function Tests: No results for input(s): AST, ALT, ALKPHOS, BILITOT, PROT, ALBUMIN in the last 168 hours.  No results for input(s): LIPASE, AMYLASE in the last 168 hours. No results for input(s): AMMONIA in the last  168 hours. Coagulation Profile: No results for input(s): INR, PROTIME in the last 168 hours. Cardiac Enzymes: No results for input(s): CKTOTAL, CKMB, CKMBINDEX, TROPONINI in the last 168 hours. BNP (last 3 results) No results for input(s): PROBNP in the last 8760 hours. HbA1C: No results for input(s): HGBA1C in the last 72 hours. CBG: Recent Labs  Lab 02/07/21 2329 02/08/21 0639 02/08/21 1226 02/08/21 1627 02/09/21 0546  GLUCAP 115* 103* 86 102* 88   Lipid Profile: No results for input(s): CHOL, HDL, LDLCALC, TRIG, CHOLHDL, LDLDIRECT in the last 72 hours. Thyroid Function Tests: No results for input(s): TSH, T4TOTAL, FREET4, T3FREE, THYROIDAB in the last 72 hours. Anemia Panel: No results for input(s): VITAMINB12, FOLATE, FERRITIN, TIBC, IRON, RETICCTPCT in the last 72 hours. Sepsis Labs: No results for input(s): PROCALCITON, LATICACIDVEN in the last 168 hours.  Recent Results (from the past 240 hour(s))  Surgical pcr screen     Status: None   Collection Time: 01/31/21  9:19 PM   Specimen: Nasal Mucosa; Nasal Swab  Result Value Ref Range Status  MRSA, PCR NEGATIVE NEGATIVE Final   Staphylococcus aureus NEGATIVE NEGATIVE Final    Comment: (NOTE) The Xpert SA Assay (FDA approved for NASAL specimens in patients 64 years of age and older), is one component of a comprehensive surveillance program. It is not intended to diagnose infection nor to guide or monitor treatment. Performed at [facility], [place] [address], [place], [place] [address]          Radiology Studies: No results found.      Scheduled Meds:  famotidine  20 mg Oral Daily   feeding supplement  237 mL Oral TID BM   hydrocortisone cream   Topical QID   hydrOXYzine  25 mg Oral TID   levothyroxine  125 mcg Oral QAC breakfast   magnesium oxide  400 mg Oral BID   metoprolol tartrate  25 mg Oral BID   multivitamin with minerals  1 tablet Oral Daily   pantoprazole  40 mg Oral BID   PARoxetine   20 mg Oral Daily   polyethylene glycol  17 g Oral Daily   rivaroxaban  20 mg Oral Q supper   Continuous Infusions:  lactated ringers 10 mL/hr at 02/01/21 1512   magnesium sulfate bolus IVPB       LOS: 14 days    Phillips Climes, MD [facility]  If 7PM-7AM, please contact night-coverage  02/09/2021, 12:09 PM

## 2021-02-09 NOTE — Plan of Care (Signed)
  Problem: Education: Goal: Knowledge of General Education information will improve Description Including pain rating scale, medication(s)/side effects and non-pharmacologic comfort measures Outcome: Progressing   

## 2021-02-09 NOTE — Progress Notes (Addendum)
8 Days Post-Op   Subjective/Chief Complaint: Doing well, passing flatus   Objective: Vital signs in last 24 hours: Temp:  [98.2 F (36.8 C)-99.2 F (37.3 C)] 99.2 F (37.3 C) (08/12 0351) Pulse Rate:  [87-96] 89 (08/12 0351) Resp:  [16-18] 18 (08/12 0351) BP: (132-174)/(77-96) 132/77 (08/12 0351) SpO2:  [96 %-100 %] 100 % (08/12 0351) Last BM Date: 01/31/21  Intake/Output from previous day: 08/11 0701 - 08/12 0700 In: 240 [P.O.:240] Out: -  Intake/Output this shift: No intake/output data recorded.  Abd: soft, nondistended, nontender. Midline incision is c/d/I with staples.    Lab Results:  Recent Labs    02/08/21 0113 02/09/21 0101  WBC 6.1 6.0  HGB 9.5* 9.2*  HCT 30.5* 30.2*  PLT 202 238   BMET Recent Labs    02/08/21 0113 02/09/21 0101  NA 136 137  K 3.6 3.4*  CL 105 105  CO2 24 25  GLUCOSE 93 93  BUN <5* <5*  CREATININE 0.58 0.59  CALCIUM 8.1* 7.9*   PT/INR No results for input(s): LABPROT, INR in the last 72 hours. ABG No results for input(s): PHART, HCO3 in the last 72 hours.  Invalid input(s): PCO2, PO2  Studies/Results: No results found.  Anti-infectives: Anti-infectives (From admission, onward)    Start     Dose/Rate Route Frequency Ordered Stop   02/01/21 0600  cefoTEtan (CEFOTAN) 2 g in sodium chloride 0.9 % 100 mL IVPB        2 g 200 mL/hr over 30 Minutes Intravenous On call to O.R. 01/31/21 1450 02/01/21 1013       Assessment/Plan: POD 8 S/P laparoscopic assisted R colectomy 8/5 by Dr. 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  Will need GI follow up for repeat c-scope likely in 3 years. Spoke with Dr. Hilarie Fredrickson today and he will put her on their recall list for 3 years. -soft diet, Ensure, miralax -wheelchair bound, but mobilize as able to chair etc with therapies -incentive spirometer -tolerating full-dose anticoagulation, hgb stable -Continue  to encourage PO intake, dispo planning -follow up being arranged, will DC staples on POD 10 FEN - soft, ensure, miralax VTE - SCDs, xarelto ID - none DIspo:stable for dispo whenever ready medically   H/O DVT  HTN Hypothyroidism Wheelchair bound   Rolm Bookbinder 02/09/2021

## 2021-02-09 NOTE — Progress Notes (Signed)
Occupational Therapy Treatment Patient Details Name: Katrina Donaldson MRN: [id number] DOB: [birth year] Today's Date: 02/09/2021    History of present illness 65 yo female presents [facility] on 7/[facility] with n/v from [facility], hgb found to be 3.0 and received PRBC. 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 PMH Includes prior [facility] 2016, HTN, chronic anticoagulation on Xarelto, DVT, depression, UTI, brain aneurysm repair, thoracic laminectomy 2018.   OT comments  Pt supine in bed and agreeable to OT session.  Patient with limited engagement with therapist and poor participation. Setup for handwashing and minimal UE exercises (see below) using level 1 theraband.  Educated on importance of mobilization but pt continues to decline OOB.  Will follow acutely.    Follow Up Recommendations  SNF    Equipment Recommendations  None recommended by OT    Recommendations for Other Services      Precautions / Restrictions Precautions Precautions: Fall       Mobility Bed Mobility               General bed mobility comments: pt declined    Transfers                      Balance                                           ADL either performed or assessed with clinical judgement   ADL Overall ADL's : Needs assistance/impaired     Grooming: Set up;Wash/dry hands Grooming Details (indicate cue type and reason): bed level                               General ADL Comments: pt unwilling to participate in OOB activities     Vision       Perception     Praxis      Cognition Arousal/Alertness: Awake/alert Behavior During Therapy: Flat affect Overall Cognitive Status: No family/caregiver present to determine baseline cognitive functioning Area of Impairment: Awareness;Problem solving                            Awareness: Emergent Problem Solving: Slow processing General Comments: increased time to respond, looks past therapist at times; poor awareness to deficits and self limiting        Exercises Exercises: General Upper Extremity General Exercises - Upper Extremity Shoulder Flexion: Strengthening;10 reps;Both;Supine;Theraband Theraband Level (Shoulder Flexion): Level 1 (Yellow) Shoulder ABduction: Strengthening;Both;10 reps;Supine;Theraband Theraband Level (Shoulder Abduction): Level 1 (Yellow)   Shoulder Instructions       General Comments pt requesting wound care dressing change- RN notified    Pertinent Vitals/ Pain       Pain Assessment: No/denies pain  Home Living                                          Prior Functioning/Environment              Frequency  Min 2X/week        Progress Toward Goals  OT Goals(current goals can now be found  in the care plan section)  Progress towards OT goals: Progressing toward goals  Acute Rehab OT Goals Patient Stated Goal: did not state any goals OT Goal Formulation: With patient  Plan Discharge plan remains appropriate;Frequency remains appropriate    Co-evaluation                 AM-PAC OT "6 Clicks" Daily Activity     Outcome Measure   Help from another person eating meals?: None Help from another person taking care of personal grooming?: A Little Help from another person toileting, which includes using toliet, bedpan, or urinal?: Total Help from another person bathing (including washing, rinsing, drying)?: A Lot Help from another person to put on and taking off regular upper body clothing?: A Little Help from another person to put on and taking off regular lower body clothing?: Total 6 Click Score: 14    End of Session    OT Visit Diagnosis: Unsteadiness on feet (R26.81);Muscle weakness (generalized) (M62.81);Pain;Other symptoms and signs involving cognitive function    Activity Tolerance Other (comment) (pt limited participation)   Patient Left in bed;with call bell/phone within reach;with bed alarm set   Nurse Communication Mobility status;Other (comment) (request for wound dressing change)        Time: [id number] OT Time Calculation (min): 22 min  Charges: OT General Charges $OT Visit: 1 Visit OT Treatments $Therapeutic Exercise: 8-22 mins  Jolaine Artist, OT [place] Pager [phone] Office [phone]    Delight Stare 02/09/2021, 1:21 PM

## 2021-02-10 DIAGNOSIS — K922 Gastrointestinal hemorrhage, unspecified: Secondary | ICD-10-CM | POA: Diagnosis not present

## 2021-02-10 LAB — BASIC METABOLIC PANEL
Anion gap: 8 (ref 5–15)
BUN: <5 mg/dL — ABNORMAL LOW (ref 8–23)
CO2: 24 mmol/L (ref 22–32)
Calcium: 8 mg/dL — ABNORMAL LOW (ref 8.9–10.3)
Chloride: 106 mmol/L (ref 98–111)
Creatinine, Ser: 0.6 mg/dL (ref 0.44–1.00)
GFR, Estimated: >60 mL/min (ref >60)
Glucose, Bld: 84 mg/dL (ref 70–99)
Potassium: 3.5 mmol/L (ref 3.5–5.1)
Sodium: 138 mmol/L (ref 135–145)

## 2021-02-10 LAB — GLUCOSE, CAPILLARY
Glucose-Capillary: 83 mg/dL (ref 70–99)
Glucose-Capillary: 84 mg/dL (ref 70–99)
Glucose-Capillary: 95 mg/dL (ref 70–99)

## 2021-02-10 NOTE — Progress Notes (Signed)
PROGRESS NOTE    Katrina Donaldson  [id number] DOB: 24-Nov-1955 DOA: 01/26/2021 PCP: Donald Prose, MD   Chief Complaint  Patient presents with   Abnormal Labs  Brief Narrative: 65 year old female with SIRS, thoracic myelopathy, WC dependent/bedbound, history of DVT on Xarelto, HTN, HLD, IDA, anxiety/depression, hypothyroidism presents With history of severe anemia hemoglobin 3, suspected upper GI.  Underwent colonoscopy that showed cecal mass concerning for malignancy, EGD showed tortuous esophagus, hernia.  Surgery was consulted and is status post laparoscopic colectomy on 8/4 pathology from colonoscopy showed acute tubulovillous adenoma with high-grade dysplasia.  PT OT following surgery following  Subjective: Seen this am RN at bedside  Still no appetite, BF tray still laying infromt Passing gas, no BM yet No fever overnight on room air blood pressure 120s to 140s  Assessment & Plan:  Cecal mass/Tubulovillous adenoma with high-grade dysplasia Lower GI bleed secondary to cecal mass Severe anemia secondary to GI bleed Hemoglobin stable since transfusion OF 4 units prbc. S/p colonoscopy with biopsy and status post laparoscopic colectomy Patient had CTA negative for active bleeding, status post EGD, CT chest abdomen pelvis negative for metastasis.  Primary surgery continue PPI, awaiting return of bowel function, augment nutrition.  Anemia from GI bleed as above. 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  Seen by surgery   Maculopapular rash/allergic dermatitis-resolved w/ steroid  Hypokalemia resolved  Anxiety/depression: Mood somewhat appears low but has generalized weakness.  Continue her paxil  Essential hypertension: Well-controlled.  Continue Lopressor 25  twice daily  History of DVT in 2018: Continue Xarelto  Hypothyroidism: Continue her Synthroid  Obesity: Will benefit with weight loss, PCP follow-up  Pressure wound as below in coccyX POA  Diet Order             DIET SOFT Room service appropriate? Yes with Assist; Fluid consistency: Thin  Diet effective now                   Nutrition Problem: Increased nutrient needs Etiology: post-op healing Signs/Symptoms: estimated needs Interventions: Refer to RD note for recommendations Patient's Body mass index is 32.19 kg/m.  Pressure Injury 01/27/21 Coccyx Medial Stage 2 -  Partial thickness loss of dermis presenting as a shallow open injury with a red, pink wound bed without slough. (Active)  01/27/21 0311  Location: Coccyx  Location Orientation: Medial  Staging: Stage 2 -  Partial thickness loss of dermis presenting as a shallow open injury with a red, pink wound bed without slough.  Wound Description (Comments):   Present on Admission:    DVT prophylaxis: Place and maintain sequential compression device Start: 01/31/21 1217 Code Status:   Code Status: Full Code  Family Communication: plan of care discussed with patient at bedside. Status is: Inpatient Remains inpatient appropriate because:Unsafe d/c plan Dispo:  Patient From:  SNF  Planned Disposition: [place]  Medically stable for discharge: No  Unresulted Labs (From admission, onward)    None     Medications reviewed:  Scheduled Meds:  famotidine  20 mg Oral Daily   feeding supplement  237 mL Oral TID BM   hydrocortisone cream   Topical QID   hydrOXYzine  25 mg Oral TID   levothyroxine  125 mcg Oral QAC breakfast   metoprolol tartrate  25 mg Oral BID   multivitamin with minerals  1 tablet Oral Daily   pantoprazole  40 mg Oral BID   PARoxetine  20 mg Oral Daily   polyethylene glycol  17 g Oral BID   rivaroxaban  20 mg Oral Q supper   Continuous Infusions:  lactated ringers 10 mL/hr at  02/01/21 1512   magnesium sulfate bolus IVPB     Consultants:see note  Procedures:see note Antimicrobials: Anti-infectives (From admission, onward)    Start     Dose/Rate Route Frequency Ordered Stop   02/01/21 0600  cefoTEtan (CEFOTAN) 2 g in sodium chloride 0.9 % 100 mL IVPB        2 g 200 mL/hr over 30 Minutes Intravenous On call to O.R. 01/31/21 1450 02/01/21 1013      Culture/Microbiology None Other culture-see note  Objective: Vitals: Today's Vitals   02/10/21 0054 02/10/21 0510 02/10/21 0514 02/10/21 0847  BP: 127/79  (!) 148/80 (!) 144/87  Pulse: 81  82 86  Resp: '16  16 15  '$ Temp: 98.1 F (36.7 C)  98 F (36.7 C) 97.9 F (36.6 C)  TempSrc: Oral Oral Oral Oral  SpO2: 99%  98% 97%  Weight:      Height:      PainSc:    0-No pain    Intake/Output Summary (Last 24 hours) at 02/10/2021 0922 Last data filed at 02/09/2021 1401 Gross per 24 hour  Intake 0 ml  Output 300 ml  Net -300 ml   Filed Weights   01/27/21 0129 01/29/21 0727 01/31/21 0913  Weight: 89.1 kg 89.1 kg 89.1 kg   Weight change:   Intake/Output from previous day: 08/12 0701 - 08/13 0700 In: 120 [P.O.:120] Out: 300 [Urine:300] Intake/Output this shift: No intake/output data recorded. Filed Weights   01/27/21 0129 01/29/21 0727 01/31/21 0913  Weight: 89.1 kg 89.1 kg 89.1 kg   Examination: General exam: AAO x3 ,weak obese, lder than stated age, weak appearing. HEENT:Oral mucosa moist, Ear/Nose WNL grossly,dentition normal. Respiratory system: bilaterally diminished,3,  no use of accessory muscle, non tender. Cardiovascular system: S1 & S2 +,No JVD. Gastrointestinal system: Abdomen soft, NT,ND, BS+. Nervous System:Alert, awake, able to wiggle only rt toes, cannot move LE, able ot move UE b/l. Extremities: no edema, distal peripheral pulses palpable.  Skin: No rashes,no icterus. MSK: Normal muscle bulk,tone, power.  Data Reviewed: I have personally reviewed following labs and imaging  studies CBC: Recent Labs  Lab 02/05/21 0647 02/06/21 0133 02/07/21 0604 02/08/21 0113 02/09/21 0101  WBC 7.1 6.9 6.8 6.1 6.0  HGB 9.8* 9.3* 9.9* 9.5* 9.2*  HCT 31.5* 29.9* 31.2* 30.5* 30.2*  MCV 84.9 86.9 85.0 86.2 86.3  PLT 111* 127* 158 202 [id number]   Basic Metabolic Panel: Recent Labs  Lab 02/05/21 0647 02/06/21 0133 02/07/21 0604 02/08/21 0113 02/09/21 0101 02/10/21 0431  NA 138 136 133* 136 137 138  K 3.2* 3.6 3.9 3.6 3.4* 3.5  CL 105 105 103 105 105 106  CO2 23 22 20* '24 25 24  '$ GLUCOSE 80 77 88 93 93 84  BUN <5* <5* <5* <5* <5* <5*  CREATININE 0.56 0.61 0.56 0.58 0.59 0.60  CALCIUM 8.1* 8.0* 7.9* 8.1* 7.9* 8.0*  MG 1.8 1.6* 2.0 1.9 1.7  --    GFR: Estimated Creatinine Clearance: 76.1 mL/min (by C-G formula based on SCr of 0.6 mg/dL). Liver Function Tests: No results for input(s): AST, ALT, ALKPHOS, BILITOT, PROT, ALBUMIN in the last 168 hours. No results  for input(s): LIPASE, AMYLASE in the last 168 hours. No results for input(s): AMMONIA in the last 168 hours. Coagulation Profile: No results for input(s): INR, PROTIME in the last 168 hours. Cardiac Enzymes: No results for input(s): CKTOTAL, CKMB, CKMBINDEX, TROPONINI in the last 168 hours. BNP (last 3 results) No results for input(s): PROBNP in the last 8760 hours. HbA1C: No results for input(s): HGBA1C in the last 72 hours. CBG: Recent Labs  Lab 02/08/21 1627 02/09/21 0546 02/09/21 1728 02/10/21 0053 02/10/21 0650  GLUCAP 102* 88 79 95 84   Lipid Profile: No results for input(s): CHOL, HDL, LDLCALC, TRIG, CHOLHDL, LDLDIRECT in the last 72 hours. Thyroid Function Tests: No results for input(s): TSH, T4TOTAL, FREET4, T3FREE, THYROIDAB in the last 72 hours. Anemia Panel: No results for input(s): VITAMINB12, FOLATE, FERRITIN, TIBC, IRON, RETICCTPCT in the last 72 hours. Sepsis Labs: No results for input(s): PROCALCITON, LATICACIDVEN in the last 168 hours.  Recent Results (from the past 240 hour(s))   Surgical pcr screen     Status: None   Collection Time: 01/31/21  9:19 PM   Specimen: Nasal Mucosa; Nasal Swab  Result Value Ref Range Status   MRSA, PCR NEGATIVE NEGATIVE Final   Staphylococcus aureus NEGATIVE NEGATIVE Final    Comment: (NOTE) The Xpert SA Assay (FDA approved for NASAL specimens in patients 24 years of age and older), is one component of a comprehensive surveillance program. It is not intended to diagnose infection nor to guide or monitor treatment. Performed at [facility], [place] [address] [place], [place] [address]      Radiology Studies: No results found.   LOS: 15 days   Antonieta Pert, MD [facility]  02/10/2021, 9:22 AM

## 2021-02-10 NOTE — Progress Notes (Signed)
Patient ID: Katrina Donaldson, female   DOB: 07-31-1955, 65 y.o.   MRN: [id number] [facility] Progress Note:   9 Days Post-Op  Subjective: Mental status is alert.  Complaints none. Objective: Vital signs in last 24 hours: Temp:  [98 F (36.7 C)-99.3 F (37.4 C)] 98 F (36.7 C) (08/13 0514) Pulse Rate:  [81-90] 82 (08/13 0514) Resp:  [15-18] 16 (08/13 0514) BP: (127-148)/(79-82) 148/80 (08/13 0514) SpO2:  [97 %-100 %] 98 % (08/13 0514)  Intake/Output from previous day: 08/12 0701 - 08/13 0700 In: 120 [P.O.:120] Out: 300 [Urine:300] Intake/Output this shift: No intake/output data recorded.  Physical Exam: Work of breathing is OK.  Incisions with staples and are bland.  Nontender  Lab Results:  Results for orders placed or performed during the hospital encounter of 01/26/21 (from the past 48 hour(s))  Glucose, capillary     Status: None   Collection Time: 02/08/21 12:26 PM  Result Value Ref Range   Glucose-Capillary 86 70 - 99 mg/dL    Comment: Glucose reference range applies only to samples taken after fasting for at least 8 hours.  Glucose, capillary     Status: Abnormal   Collection Time: 02/08/21  4:27 PM  Result Value Ref Range   Glucose-Capillary 102 (H) 70 - 99 mg/dL    Comment: Glucose reference range applies only to samples taken after fasting for at least 8 hours.   Comment 1 Notify RN    Comment 2 Document in Chart   Basic metabolic panel     Status: Abnormal   Collection Time: 02/09/21  1:01 AM  Result Value Ref Range   Sodium 137 135 - 145 mmol/L   Potassium 3.4 (L) 3.5 - 5.1 mmol/L   Chloride 105 98 - 111 mmol/L   CO2 25 22 - 32 mmol/L   Glucose, Bld 93 70 - 99 mg/dL    Comment: Glucose reference range applies only to samples taken after fasting for at least 8 hours.   BUN <5 (L) 8 - 23 mg/dL   Creatinine, Ser 0.59 0.44 - 1.00 mg/dL   Calcium 7.9 (L) 8.9 - 10.3 mg/dL   GFR, Estimated >60 >60 mL/min    Comment: (NOTE) Calculated using the  CKD-EPI Creatinine Equation (2021)    Anion gap 7 5 - 15    Comment: Performed at [facility] [address] [place], [place] [address]  CBC     Status: Abnormal   Collection Time: 02/09/21  1:01 AM  Result Value Ref Range   WBC 6.0 4.0 - 10.5 K/uL   RBC 3.50 (L) 3.87 - 5.11 MIL/uL   Hemoglobin 9.2 (L) 12.0 - 15.0 g/dL   HCT 30.2 (L) 36.0 - 46.0 %   MCV 86.3 80.0 - 100.0 fL   MCH 26.3 26.0 - 34.0 pg   MCHC 30.5 30.0 - 36.0 g/dL   RDW 27.3 (H) 11.5 - 15.5 %   Platelets 238 150 - 400 K/uL   nRBC 0.0 0.0 - 0.2 %    Comment: Performed at [facility], [place] [address], [place], [place] [address]  Magnesium     Status: None   Collection Time: 02/09/21  1:01 AM  Result Value Ref Range   Magnesium 1.7 1.7 - 2.4 mg/dL    Comment: Performed at [facility] [address], [place], [place] [address]  Glucose, capillary     Status: None   Collection Time: 02/09/21  5:46 AM  Result Value Ref Range   Glucose-Capillary 88 70 - 99 mg/dL    Comment: Glucose reference range applies only to samples taken after fasting for at least 8 hours.   Comment 1 Notify RN    Comment 2 Document in Chart   Glucose, capillary     Status: None   Collection Time: 02/09/21  5:28 PM  Result Value Ref Range   Glucose-Capillary 79 70 - 99 mg/dL    Comment: Glucose reference range applies only to samples taken after fasting for at least 8 hours.   Comment 1 Notify RN    Comment 2 Document in Chart   Glucose, capillary     Status: None   Collection Time: 02/10/21 12:53 AM  Result Value Ref Range   Glucose-Capillary 95 70 - 99 mg/dL    Comment: Glucose reference range applies only to samples taken after fasting for at least 8 hours.  Basic metabolic panel     Status: Abnormal   Collection Time: 02/10/21  4:31 AM  Result Value Ref Range   Sodium 138 135 - 145 mmol/L   Potassium 3.5 3.5 - 5.1 mmol/L   Chloride 106 98 - 111 mmol/L   CO2 24 22 - 32 mmol/L   Glucose, Bld 84 70 - 99 mg/dL     Comment: Glucose reference range applies only to samples taken after fasting for at least 8 hours.   BUN <5 (L) 8 - 23 mg/dL   Creatinine, Ser 0.60 0.44 - 1.00 mg/dL   Calcium 8.0 (L) 8.9 - 10.3 mg/dL   GFR, Estimated >60 >60 mL/min    Comment: (NOTE) Calculated using the CKD-EPI Creatinine Equation (2021)    Anion gap 8 5 - 15    Comment: Performed at [facility] [address], [place], [place] [address]  Glucose, capillary     Status: None   Collection Time: 02/10/21  6:50 AM  Result Value Ref Range   Glucose-Capillary 84 70 - 99 mg/dL    Comment: Glucose reference range applies only to samples taken after fasting for at least 8 hours.    Radiology/Results: No results found.  Anti-infectives: Anti-infectives (From admission, onward)    Start     Dose/Rate Route Frequency Ordered Stop   02/01/21 0600  cefoTEtan (CEFOTAN) 2 g in sodium chloride 0.9 % 100 mL IVPB        2 g 200 mL/hr over 30 Minutes Intravenous On call to O.R. 01/31/21 1450 02/01/21 1013       Assessment/Plan: Problem List: Patient Active Problem List   Diagnosis Date Noted   Cecum mass    Abnormal computed tomography angiography (CTA) of abdomen and pelvis    Pressure injury of skin 01/27/2021   GI bleed 01/26/2021   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   Chronic anticoagulation 05/26/2017   Urinary retention    Acute pain of right knee    Acute lower UTI    Weakness of right lower extremity    Confusion    Hydrocephalus (HCC)    Poor appetite    Acute blood loss anemia    Post-operative pain    Hypokalemia    Leukocytosis    Thrombocytopenia (HCC)    Acute deep vein thrombosis (DVT) of femoral vein of left lower extremity (HCC)    Myelopathy ([place]) 07/24/2016   Thoracic myelopathy    Depression    Benign essential HTN    History of subarachnoid hemorrhage    Hypercoagulable  [facility])    Constipation due to pain medication    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 Hassell Done, MD, [facility] [phone] to reach the surgeon on call.    02/10/2021 8:41 AM

## 2021-02-11 DIAGNOSIS — K6389 Other specified diseases of intestine: Secondary | ICD-10-CM | POA: Diagnosis not present

## 2021-02-11 DIAGNOSIS — D649 Anemia, unspecified: Secondary | ICD-10-CM | POA: Diagnosis not present

## 2021-02-11 DIAGNOSIS — K922 Gastrointestinal hemorrhage, unspecified: Secondary | ICD-10-CM | POA: Diagnosis not present

## 2021-02-11 LAB — GLUCOSE, CAPILLARY
Glucose-Capillary: 76 mg/dL (ref 70–99)
Glucose-Capillary: 83 mg/dL (ref 70–99)
Glucose-Capillary: 83 mg/dL (ref 70–99)

## 2021-02-11 MED ORDER — POLYETHYLENE GLYCOL 3350 17 G PO PACK
17.0000 g | PACK | Freq: Two times a day (BID) | ORAL | Status: DC
  Administered 2021-02-13: 17 g via ORAL
  Filled 2021-02-11 (×4): qty 1

## 2021-02-11 MED ORDER — SENNOSIDES-DOCUSATE SODIUM 8.6-50 MG PO TABS
2.0000 | ORAL_TABLET | Freq: Once | ORAL | Status: AC
  Administered 2021-02-11: 2 via ORAL
  Filled 2021-02-11: qty 2

## 2021-02-11 MED ORDER — BISACODYL 5 MG PO TBEC
10.0000 mg | DELAYED_RELEASE_TABLET | Freq: Every day | ORAL | Status: DC | PRN
  Administered 2021-02-11: 10 mg via ORAL
  Filled 2021-02-11: qty 2

## 2021-02-11 MED ORDER — MAGNESIUM HYDROXIDE 400 MG/5ML PO SUSP
30.0000 mL | Freq: Every day | ORAL | Status: AC
  Administered 2021-02-11: 30 mL via ORAL
  Filled 2021-02-11: qty 30

## 2021-02-11 NOTE — Progress Notes (Signed)
PROGRESS NOTE    Katrina Donaldson  [id number] DOB: [birth year] DOA: 01/26/2021 PCP: Donald Prose, MD   Chief Complaint  Patient presents with   Abnormal Labs  Brief Narrative: 65 year old female with SIRS, thoracic myelopathy, WC dependent/bedbound, history of DVT on Xarelto, HTN, HLD, IDA, anxiety/depression, hypothyroidism presents With history of severe anemia hemoglobin 3, suspected upper GI.  Underwent colonoscopy that showed cecal mass concerning for malignancy, EGD showed tortuous esophagus, hernia.  Surgery was consulted and is status post laparoscopic colectomy on 8/4 pathology from colonoscopy showed acute tubulovillous adenoma with high-grade dysplasia.  PT OT following surgery following  Subjective:  Still no bowel movements as discussed with staff, appetite remains poor, patient herself denies any complaints.  Assessment & Plan:  Cecal mass/Tubulovillous adenoma with high-grade dysplasia Lower GI bleed secondary to cecal mass Severe anemia secondary to GI bleed Hemoglobin stable since transfusion OF 4 units prbc. S/p colonoscopy with biopsy and status post laparoscopic colectomy Patient had CTA negative for active bleeding, status post EGD, CT chest abdomen pelvis negative for metastasis.  Primary surgery continue PPI, awaiting return of bowel function, augment nutrition.  Was encouraged to drink her supplements, no bowel movements yet since her surgery despite multiple laxatives, will increase laxative dose today. -DC incisions 10 days from surgery, likely tomorrow.  Anemia from GI bleed as above. 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  Seen by surgery   Maculopapular rash/allergic dermatitis-resolved w/ steroid  Hypokalemia  resolved  Anxiety/depression: Mood somewhat appears low but has generalized weakness.  Continue her paxil  Essential hypertension: Well-controlled.  Continue Lopressor 25 twice daily  History of DVT in 2018: Continue Xarelto  Hypothyroidism: Continue her Synthroid  Obesity: Will benefit with weight loss, PCP follow-up  Pressure wound as below in coccyX POA  Diet Order             DIET SOFT Room service appropriate? Yes with Assist; Fluid consistency: Thin  Diet effective now                   Nutrition Problem: Increased nutrient needs Etiology: post-op healing Signs/Symptoms: estimated needs Interventions: Refer to RD note for recommendations Patient's Body mass index is 32.19 kg/m.  Pressure Injury 01/27/21 Coccyx Medial Stage 2 -  Partial thickness loss of dermis presenting as a shallow open injury with a red, pink wound bed without slough. (Active)  01/27/21 0311  Location: Coccyx  Location Orientation: Medial  Staging: Stage 2 -  Partial thickness loss of dermis presenting as a shallow open injury with a red, pink wound bed without slough.  Wound Description (Comments):   Present on Admission:    DVT prophylaxis: Currently on Xarelto Code Status:   Code Status: Full Code  Family Communication: plan of care discussed with patient at bedside. Status is: Inpatient Remains inpatient appropriate because:Unsafe d/c plan Dispo:  Patient From:  SNF  Planned Disposition: [facility] awaiting  return of bowel function and bowel movements.  Medically stable for discharge: No  Unresulted Labs (From admission, onward)    None     Medications reviewed:  Scheduled Meds:  famotidine  20 mg Oral Daily   feeding supplement  237 mL Oral TID BM   hydrocortisone cream   Topical QID   hydrOXYzine  25 mg Oral TID   levothyroxine  125 mcg Oral QAC breakfast   magnesium hydroxide  30 mL Oral Daily   metoprolol tartrate  25 mg Oral BID   multivitamin with  minerals  1 tablet Oral Daily   pantoprazole  40 mg Oral BID   PARoxetine  20 mg Oral Daily   polyethylene glycol  17 g Oral BID   rivaroxaban  20 mg Oral Q supper   senna-docusate  2 tablet Oral Once   Continuous Infusions:  lactated ringers 10 mL/hr at 02/01/21 1512   magnesium sulfate bolus IVPB     Consultants:see note  Procedures:see note Antimicrobials: Anti-infectives (From admission, onward)    Start     Dose/Rate Route Frequency Ordered Stop   02/01/21 0600  cefoTEtan (CEFOTAN) 2 g in sodium chloride 0.9 % 100 mL IVPB        2 g 200 mL/hr over 30 Minutes Intravenous On call to O.R. 01/31/21 1450 02/01/21 1013      Culture/Microbiology None Other culture-see note  Objective: Vitals: Today's Vitals   02/10/21 2200 02/11/21 0028 02/11/21 0354 02/11/21 0832  BP: (!) 142/85 123/73 (!) 146/86 (!) 144/72  Pulse: 91 78 79 81  Resp: '15 18 18 18  '$ Temp: 98.4 F (36.9 C) 98.2 F (36.8 C) 98 F (36.7 C) 97.6 F (36.4 C)  TempSrc: Oral Oral Oral Oral  SpO2: 96% 98% 97% 99%  Weight:      Height:      PainSc: 0-No pain   0-No pain    Intake/Output Summary (Last 24 hours) at 02/11/2021 1124 Last data filed at 02/10/2021 1200 Gross per 24 hour  Intake 120 ml  Output --  Net 120 ml   Filed Weights   01/27/21 0129 01/29/21 0727 01/31/21 0913  Weight: 89.1 kg 89.1 kg 89.1 kg   Weight change:   Intake/Output from previous day: 08/13 0701 - 08/14 0700 In: 240 [P.O.:240] Out: -  Intake/Output this shift: No intake/output data recorded. Filed Weights   01/27/21 0129 01/29/21 0727 01/31/21 0913  Weight: 89.1 kg 89.1 kg 89.1 kg   Examination:  Awake Alert, Oriented X 3, No new F.N deficits, Normal affect Symmetrical Chest wall movement, Good air movement bilaterally, CTAB RRR,No Gallops,Rubs or new Murmurs, No Parasternal Heave +ve B.Sounds, Abd Soft, surgical incision C/C/I. Chronic B/L lower extremity weakness, no edema    Data Reviewed: I have personally  reviewed following labs and imaging studies CBC: Recent Labs  Lab 02/05/21 0647 02/06/21 0133 02/07/21 0604 02/08/21 0113 02/09/21 0101  WBC 7.1 6.9 6.8 6.1 6.0  HGB 9.8* 9.3* 9.9* 9.5* 9.2*  HCT 31.5* 29.9* 31.2* 30.5* 30.2*  MCV 84.9 86.9 85.0 86.2 86.3  PLT 111* 127* 158 202 [id number]   Basic Metabolic Panel: Recent Labs  Lab 02/05/21 0647 02/06/21 0133 02/07/21 0604 02/08/21 0113 02/09/21 0101 02/10/21 0431  NA 138 136 133* 136 137 138  K 3.2* 3.6 3.9 3.6 3.4* 3.5  CL 105 105 103 105 105 106  CO2 23 22 20* '24 25 24  '$ GLUCOSE 80 77 88 93 93 84  BUN <5* <5* <5* <5* <5* <5*  CREATININE 0.56 0.61 0.56 0.58 0.59 0.60  CALCIUM 8.1* 8.0* 7.9* 8.1* 7.9* 8.0*  MG 1.8 1.6* 2.0 1.9 1.7  --    GFR: Estimated Creatinine Clearance: 76.1 mL/min (by C-G formula based on SCr of 0.6 mg/dL). Liver Function Tests: No results for input(s): AST, ALT, ALKPHOS, BILITOT, PROT, ALBUMIN in  the last 168 hours. No results for input(s): LIPASE, AMYLASE in the last 168 hours. No results for input(s): AMMONIA in the last 168 hours. Coagulation Profile: No results for input(s): INR, PROTIME in the last 168 hours. Cardiac Enzymes: No results for input(s): CKTOTAL, CKMB, CKMBINDEX, TROPONINI in the last 168 hours. BNP (last 3 results) No results for input(s): PROBNP in the last 8760 hours. HbA1C: No results for input(s): HGBA1C in the last 72 hours. CBG: Recent Labs  Lab 02/10/21 0053 02/10/21 0650 02/10/21 1227 02/11/21 0027 02/11/21 0622  GLUCAP 95 84 83 76 83   Lipid Profile: No results for input(s): CHOL, HDL, LDLCALC, TRIG, CHOLHDL, LDLDIRECT in the last 72 hours. Thyroid Function Tests: No results for input(s): TSH, T4TOTAL, FREET4, T3FREE, THYROIDAB in the last 72 hours. Anemia Panel: No results for input(s): VITAMINB12, FOLATE, FERRITIN, TIBC, IRON, RETICCTPCT in the last 72 hours. Sepsis Labs: No results for input(s): PROCALCITON, LATICACIDVEN in the last 168 hours.  No results  found for this or any previous visit (from the past 240 hour(s)).    Radiology Studies: No results found.   LOS: 35 days   Phillips Climes, MD [facility]  02/11/2021, 11:24 AM

## 2021-02-12 DIAGNOSIS — K6389 Other specified diseases of intestine: Secondary | ICD-10-CM | POA: Diagnosis not present

## 2021-02-12 DIAGNOSIS — D649 Anemia, unspecified: Secondary | ICD-10-CM | POA: Diagnosis not present

## 2021-02-12 LAB — CBC
HCT: 32.4 % — ABNORMAL LOW (ref 36.0–46.0)
Hemoglobin: 10.3 g/dL — ABNORMAL LOW (ref 12.0–15.0)
MCH: 27.1 pg (ref 26.0–34.0)
MCHC: 31.8 g/dL (ref 30.0–36.0)
MCV: 85.3 fL (ref 80.0–100.0)
Platelets: 403 10*3/uL — ABNORMAL HIGH (ref 150–400)
RBC: 3.8 MIL/uL — ABNORMAL LOW (ref 3.87–5.11)
RDW: 27.3 % — ABNORMAL HIGH (ref 11.5–15.5)
WBC: 7.9 10*3/uL (ref 4.0–10.5)
nRBC: 0 % (ref 0.0–0.2)

## 2021-02-12 LAB — GLUCOSE, CAPILLARY
Glucose-Capillary: 105 mg/dL — ABNORMAL HIGH (ref 70–99)
Glucose-Capillary: 84 mg/dL (ref 70–99)
Glucose-Capillary: 90 mg/dL (ref 70–99)
Glucose-Capillary: 90 mg/dL (ref 70–99)

## 2021-02-12 LAB — SARS CORONAVIRUS 2 (TAT 6-24 HRS): SARS Coronavirus 2: POSITIVE — AB

## 2021-02-12 MED ORDER — BISACODYL 5 MG PO TBEC: 10.0000 mg | DELAYED_RELEASE_TABLET | Freq: Every day | ORAL | Status: DC | PRN

## 2021-02-12 MED ORDER — POLYETHYLENE GLYCOL 3350 17 G PO PACK
34.0000 g | PACK | Freq: Once | ORAL | Status: AC
  Administered 2021-02-12: 34 g via ORAL
  Filled 2021-02-12: qty 2

## 2021-02-12 MED ORDER — MAGNESIUM HYDROXIDE 400 MG/5ML PO SUSP
30.0000 mL | Freq: Once | ORAL | Status: AC
  Administered 2021-02-12: 30 mL via ORAL
  Filled 2021-02-12: qty 30

## 2021-02-12 MED ORDER — BISACODYL 10 MG RE SUPP
10.0000 mg | Freq: Once | RECTAL | Status: DC
  Filled 2021-02-12: qty 1

## 2021-02-12 NOTE — Progress Notes (Signed)
11 Days Post-Op   Subjective/Chief Complaint: Passing flatus still. No BM.  Drank MoM yesterday.  Eating some, but doesn't have a great appetite.   Objective: Vital signs in last 24 hours: Temp:  [97.8 F (36.6 C)-98.9 F (37.2 C)] 97.8 F (36.6 C) (08/15 0839) Pulse Rate:  [79-92] 84 (08/15 0839) Resp:  [16-19] 16 (08/15 0839) BP: (137-162)/(82-90) 146/90 (08/15 0839) SpO2:  [94 %-100 %] 98 % (08/15 0839) Last BM Date:  (Prior to admission)  Intake/Output from previous day: 08/14 0701 - 08/15 0700 In: 100 [P.O.:100] Out: -  Intake/Output this shift: No intake/output data recorded.  Abd: soft, nondistended, nontender. Midline incision is c/d/I with staples.    Lab Results:  No results for input(s): WBC, HGB, HCT, PLT in the last 72 hours.  BMET Recent Labs    02/10/21 0431  NA 138  K 3.5  CL 106  CO2 24  GLUCOSE 84  BUN <5*  CREATININE 0.60  CALCIUM 8.0*   PT/INR No results for input(s): LABPROT, INR in the last 72 hours. ABG No results for input(s): PHART, HCO3 in the last 72 hours.  Invalid input(s): PCO2, PO2  Studies/Results: No results found.  Anti-infectives: Anti-infectives (From admission, onward)    Start     Dose/Rate Route Frequency Ordered Stop   02/01/21 0600  cefoTEtan (CEFOTAN) 2 g in sodium chloride 0.9 % 100 mL IVPB        2 g 200 mL/hr over 30 Minutes Intravenous On call to O.R. 01/31/21 1450 02/01/21 1013       Assessment/Plan: POD 11, S/P laparoscopic assisted R colectomy 8/5 by Dr. 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  GI has placed on recall list for 3 years -soft diet, Ensure, miralax, dulcolax suppository -wheelchair bound, but mobilize as able to chair etc with therapies -incentive spirometer -tolerating full-dose anticoagulation, hgb stable -Continue to encourage PO intake, dispo planning -DC staples today -surgically stable for DC  to SNF -will follow peripherally right now while awaiting placement.  FEN - soft, ensure, miralax VTE - SCDs, xarelto ID - none DIspo:stable for dispo whenever ready medically   H/O DVT  HTN Hypothyroidism Wheelchair bound   Henreitta Cea 02/12/2021

## 2021-02-12 NOTE — Progress Notes (Signed)
PROGRESS NOTE    Katrina Donaldson  [id number] DOB: 1955/08/28 DOA: 01/26/2021 PCP: Donald Prose, MD   Chief Complaint  Patient presents with   Abnormal Labs   Brief Narrative: 65 year old female with SIRS, thoracic myelopathy, WC dependent/bedbound, history of DVT on Xarelto, HTN, HLD, IDA, anxiety/depression, hypothyroidism presents With history of severe anemia hemoglobin 3, suspected upper GI.  Underwent colonoscopy that showed cecal mass concerning for malignancy, EGD showed tortuous esophagus, hernia.  Surgery was consulted and is status post laparoscopic colectomy on 8/4 pathology from colonoscopy showed acute tubulovillous adenoma with high-grade dysplasia.  PT OT following surgery following  Subjective:  Appetite has mildly improved, she is eating some but not great, he still passing gas, she denies any abdominal pain, no bowel movement yet since her surgery.    Assessment & Plan:  Cecal mass/Tubulovillous adenoma with high-grade dysplasia Lower GI bleed secondary to cecal mass Severe anemia secondary to GI bleed Hemoglobin stable since transfusion OF 4 units prbc. S/p colonoscopy with biopsy and status post laparoscopic colectomy Patient had CTA negative for active bleeding, status post EGD, CT chest abdomen pelvis negative for metastasis.  Primary surgery continue PPI, awaiting return of bowel function, augment nutrition.  Was encouraged to drink her supplements, no bowel movements yet since her surgery despite multiple laxatives, Juliane on top of laxatives yesterday, will give again today, she refused suppository earlier today. . -staples likely will be discontinued today.   Anemia from GI bleed as above. Hh stable Recent Labs  Lab 02/06/21 0133 02/07/21 0604 02/08/21 0113 02/09/21 0101  HGB 9.3* 9.9* 9.5* 9.2*  HCT 29.9* 31.2* 30.5* 30.2*    Large hiatal hernia with intrathoracic stomach Severe left hemidiaphragm Continue PPI.  Seen by surgery   Maculopapular  rash/allergic dermatitis-resolved w/ steroid  Hypokalemia resolved  Anxiety/depression: Mood somewhat appears low but has generalized weakness.  Continue her paxil  Essential hypertension: Well-controlled.  Continue Lopressor 25 twice daily  History of DVT in 2018: Continue Xarelto  Hypothyroidism: Continue her Synthroid  Obesity: Will benefit with weight loss, PCP follow-up  Pressure wound as below in coccyX POA  Diet Order             DIET SOFT Room service appropriate? Yes with Assist; Fluid consistency: Thin  Diet effective now                   Nutrition Problem: Increased nutrient needs Etiology: post-op healing Signs/Symptoms: estimated needs Interventions: Refer to RD note for recommendations Patient's Body mass index is 32.19 kg/m.  Pressure Injury 01/27/21 Coccyx Medial Stage 2 -  Partial thickness loss of dermis presenting as a shallow open injury with a red, pink wound bed without slough. (Active)  01/27/21 0311  Location: Coccyx  Location Orientation: Medial  Staging: Stage 2 -  Partial thickness loss of dermis presenting as a shallow open injury with a red, pink wound bed without slough.  Wound Description (Comments):   Present on Admission:    DVT prophylaxis: Currently on Xarelto Code Status:   Code Status: Full Code  Family Communication: plan of care discussed with patient at bedside.no family at bedside Status is: Inpatient Remains inpatient appropriate because:Unsafe d/c plan Dispo:  Patient From:  SNF  Planned Disposition: [facility] awaiting  return of bowel function and bowel movements.  Medically stable for discharge: No  Unresulted Labs (From admission, onward)    None     Medications reviewed:  Scheduled Meds:  bisacodyl  10 mg  Rectal Once   famotidine  20 mg Oral Daily   feeding supplement  237 mL Oral TID BM   hydrocortisone cream   Topical QID   hydrOXYzine  25 mg Oral TID   levothyroxine  125 mcg Oral QAC  breakfast   magnesium hydroxide  30 mL Oral Once   metoprolol tartrate  25 mg Oral BID   multivitamin with minerals  1 tablet Oral Daily   pantoprazole  40 mg Oral BID   PARoxetine  20 mg Oral Daily   polyethylene glycol  17 g Oral BID   rivaroxaban  20 mg Oral Q supper   Continuous Infusions:  lactated ringers 10 mL/hr at 02/01/21 1512   magnesium sulfate bolus IVPB     Consultants:see note  Procedures:see note Antimicrobials: Anti-infectives (From admission, onward)    Start     Dose/Rate Route Frequency Ordered Stop   02/01/21 0600  cefoTEtan (CEFOTAN) 2 g in sodium chloride 0.9 % 100 mL IVPB        2 g 200 mL/hr over 30 Minutes Intravenous On call to O.R. 01/31/21 1450 02/01/21 1013      Culture/Microbiology None Other culture-see note  Objective: Vitals: Today's Vitals   02/11/21 2204 02/11/21 2307 02/12/21 0338 02/12/21 0839  BP:  (!) 141/82 137/83 (!) 146/90  Pulse:  79 80 84  Resp:  '19 16 16  '$ Temp:  98.4 F (36.9 C) 97.8 F (36.6 C) 97.8 F (36.6 C)  TempSrc:  Oral Oral Oral  SpO2:  94% 97% 98%  Weight:      Height:      PainSc: Asleep       Intake/Output Summary (Last 24 hours) at 02/12/2021 1308 Last data filed at 02/11/2021 2104 Gross per 24 hour  Intake 100 ml  Output --  Net 100 ml   Filed Weights   01/27/21 0129 01/29/21 0727 01/31/21 0913  Weight: 89.1 kg 89.1 kg 89.1 kg   Weight change:   Intake/Output from previous day: 08/14 0701 - 08/15 0700 In: 100 [P.O.:100] Out: -  Intake/Output this shift: No intake/output data recorded. Filed Weights   01/27/21 0129 01/29/21 0727 01/31/21 0913  Weight: 89.1 kg 89.1 kg 89.1 kg   Examination:  Awake Alert, Oriented X 3, No new F.N deficits, Normal affect Symmetrical Chest wall movement, Good air movement bilaterally, CTAB RRR,No Gallops,Rubs or new Murmurs, No Parasternal Heave +ve B.Sounds, Abd Soft, surgical incision C/C/I. Chronic B/L lower extremity weakness, no edema    Data  Reviewed: I have personally reviewed following labs and imaging studies CBC: Recent Labs  Lab 02/06/21 0133 02/07/21 0604 02/08/21 0113 02/09/21 0101  WBC 6.9 6.8 6.1 6.0  HGB 9.3* 9.9* 9.5* 9.2*  HCT 29.9* 31.2* 30.5* 30.2*  MCV 86.9 85.0 86.2 86.3  PLT 127* 158 202 [id number]   Basic Metabolic Panel: Recent Labs  Lab 02/06/21 0133 02/07/21 0604 02/08/21 0113 02/09/21 0101 02/10/21 0431  NA 136 133* 136 137 138  K 3.6 3.9 3.6 3.4* 3.5  CL 105 103 105 105 106  CO2 22 20* '24 25 24  '$ GLUCOSE 77 88 93 93 84  BUN <5* <5* <5* <5* <5*  CREATININE 0.61 0.56 0.58 0.59 0.60  CALCIUM 8.0* 7.9* 8.1* 7.9* 8.0*  MG 1.6* 2.0 1.9 1.7  --    GFR: Estimated Creatinine Clearance: 76.1 mL/min (by C-G formula based on SCr of 0.6 mg/dL). Liver Function Tests: No results for input(s): AST, ALT, ALKPHOS, BILITOT, PROT, ALBUMIN in  the last 168 hours. No results for input(s): LIPASE, AMYLASE in the last 168 hours. No results for input(s): AMMONIA in the last 168 hours. Coagulation Profile: No results for input(s): INR, PROTIME in the last 168 hours. Cardiac Enzymes: No results for input(s): CKTOTAL, CKMB, CKMBINDEX, TROPONINI in the last 168 hours. BNP (last 3 results) No results for input(s): PROBNP in the last 8760 hours. HbA1C: No results for input(s): HGBA1C in the last 72 hours. CBG: Recent Labs  Lab 02/11/21 0622 02/11/21 1202 02/12/21 0023 02/12/21 0617 02/12/21 1147  GLUCAP 83 83 105* 90 90   Lipid Profile: No results for input(s): CHOL, HDL, LDLCALC, TRIG, CHOLHDL, LDLDIRECT in the last 72 hours. Thyroid Function Tests: No results for input(s): TSH, T4TOTAL, FREET4, T3FREE, THYROIDAB in the last 72 hours. Anemia Panel: No results for input(s): VITAMINB12, FOLATE, FERRITIN, TIBC, IRON, RETICCTPCT in the last 72 hours. Sepsis Labs: No results for input(s): PROCALCITON, LATICACIDVEN in the last 168 hours.  No results found for this or any previous visit (from the past 240  hour(s)).    Radiology Studies: No results found.   LOS: 68 days   Phillips Climes, MD [facility]  02/12/2021, 1:08 PM

## 2021-02-12 NOTE — Care Management Important Message (Signed)
Important Message  Patient Details  Name: Lanyia Margulis MRN: [id number] Date of Birth: [birth year]   Medicare Important Message Given:  Yes     [name] 02/12/2021, 9:40 AM

## 2021-02-13 DIAGNOSIS — K922 Gastrointestinal hemorrhage, unspecified: Secondary | ICD-10-CM | POA: Diagnosis not present

## 2021-02-13 DIAGNOSIS — U071 COVID-19: Secondary | ICD-10-CM

## 2021-02-13 DIAGNOSIS — D649 Anemia, unspecified: Secondary | ICD-10-CM | POA: Diagnosis not present

## 2021-02-13 DIAGNOSIS — K6389 Other specified diseases of intestine: Secondary | ICD-10-CM | POA: Diagnosis not present

## 2021-02-13 LAB — RESP PANEL BY RT-PCR (FLU A&B, COVID) ARPGX2
Influenza A by PCR: NEGATIVE
Influenza B by PCR: NEGATIVE
SARS Coronavirus 2 by RT PCR: POSITIVE — AB

## 2021-02-13 LAB — GLUCOSE, CAPILLARY
Glucose-Capillary: 129 mg/dL — ABNORMAL HIGH (ref 70–99)
Glucose-Capillary: 83 mg/dL (ref 70–99)
Glucose-Capillary: 85 mg/dL (ref 70–99)
Glucose-Capillary: 93 mg/dL (ref 70–99)

## 2021-02-13 MED ORDER — PANTOPRAZOLE SODIUM 40 MG PO TBEC: 40.0000 mg | DELAYED_RELEASE_TABLET | Freq: Two times a day (BID) | ORAL | Status: AC

## 2021-02-13 MED ORDER — HYDROXYZINE HCL 25 MG PO TABS: 25.0000 mg | ORAL_TABLET | Freq: Three times a day (TID) | ORAL | 0 refills | Status: DC

## 2021-02-13 MED ORDER — BISACODYL 5 MG PO TBEC: 10.0000 mg | DELAYED_RELEASE_TABLET | Freq: Every day | ORAL | 0 refills | Status: AC | PRN

## 2021-02-13 MED ORDER — BEBTELOVIMAB 175 MG/2 ML IV (EUA)
175.0000 mg | Freq: Once | INTRAMUSCULAR | Status: DC
  Filled 2021-02-13: qty 2

## 2021-02-13 MED ORDER — EPINEPHRINE 0.3 MG/0.3ML IJ SOAJ
0.3000 mg | Freq: Once | INTRAMUSCULAR | Status: DC | PRN
  Filled 2021-02-13: qty 0.6

## 2021-02-13 MED ORDER — FAMOTIDINE IN NACL 20-0.9 MG/50ML-% IV SOLN
20.0000 mg | Freq: Once | INTRAVENOUS | Status: DC | PRN
  Filled 2021-02-13: qty 50

## 2021-02-13 MED ORDER — SODIUM CHLORIDE 0.9 % IV SOLN: INTRAVENOUS | Status: DC | PRN

## 2021-02-13 MED ORDER — ENSURE ENLIVE PO LIQD: 237.0000 mL | Freq: Three times a day (TID) | ORAL | 12 refills | Status: AC

## 2021-02-13 MED ORDER — METHYLPREDNISOLONE SODIUM SUCC 125 MG IJ SOLR: 125.0000 mg | Freq: Once | INTRAMUSCULAR | Status: DC | PRN

## 2021-02-13 MED ORDER — BISACODYL 10 MG RE SUPP: 10.0000 mg | Freq: Once | RECTAL | 0 refills | Status: AC

## 2021-02-13 MED ORDER — ACETAMINOPHEN 325 MG PO TABS: 650.0000 mg | ORAL_TABLET | Freq: Four times a day (QID) | ORAL | Status: DC | PRN

## 2021-02-13 MED ORDER — ALBUTEROL SULFATE HFA 108 (90 BASE) MCG/ACT IN AERS
2.0000 | INHALATION_SPRAY | Freq: Once | RESPIRATORY_TRACT | Status: DC | PRN
  Filled 2021-02-13: qty 6.7

## 2021-02-13 MED ORDER — MAGNESIUM HYDROXIDE 400 MG/5ML PO SUSP
30.0000 mL | Freq: Once | ORAL | Status: DC
  Filled 2021-02-13: qty 30

## 2021-02-13 MED ORDER — METOPROLOL TARTRATE 25 MG PO TABS: 25.0000 mg | ORAL_TABLET | Freq: Two times a day (BID) | ORAL | Status: AC

## 2021-02-13 MED ORDER — DIPHENHYDRAMINE HCL 50 MG/ML IJ SOLN: 50.0000 mg | Freq: Once | INTRAMUSCULAR | Status: DC | PRN

## 2021-02-13 MED ORDER — MAGNESIUM HYDROXIDE 400 MG/5ML PO SUSP: 30.0000 mL | Freq: Once | ORAL | 0 refills | Status: AC

## 2021-02-13 MED ORDER — ADULT MULTIVITAMIN W/MINERALS CH: 1.0000 | ORAL_TABLET | Freq: Every day | ORAL | Status: AC

## 2021-02-13 NOTE — Progress Notes (Signed)
Occupational Therapy Treatment Patient Details Name: Katrina Donaldson MRN: [id number] DOB: [birth year] Today's Date: 02/13/2021    History of present illness 65 yo female presents to [facility] on 7/29 with n/v from [facility], hgb found to be 3.0 and received PRBC. 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 COVID + 8/15. PMH Includes prior [facility] 2016, HTN, chronic anticoagulation on Xarelto, DVT, depression, UTI, brain aneurysm repair, thoracic laminectomy 2018.   OT comments  Patient supine in bed and agreeable to OT with encouragement.  Completing bed mobility with min assist, grooming bed level with setup assist.  Declines OOB.  Discussed acute OT role, and pt voices preference to maintain bed level and declines getting OOB with therapist.  She is able to complete UB ADLs with setup assist but would require increased assist for LB, she can reposition self in bed with min assist.  Will defer further services to SNF, no further acute OT role required and pt agreeable.  Educated on importance of sitting upright in bed, pt voiced understanding.      Follow Up Recommendations  SNF    Equipment Recommendations  None recommended by OT    Recommendations for Other Services      Precautions / Restrictions Precautions Precautions: Fall Restrictions Weight Bearing Restrictions: No       Mobility Bed Mobility Overal bed mobility: Needs Assistance Bed Mobility: Rolling Rolling: Min assist         General bed mobility comments: pt able pull self up with BUEs given min assist to reposition in bed, min assist to roll to straighten out pads    Transfers                 General transfer comment: pt declined    Balance                                           ADL either performed or assessed with clinical judgement   ADL  Overall ADL's : Needs assistance/impaired     Grooming: Wash/dry face;Oral care;Set up;Bed level                     Toilet Transfer Details (indicate cue type and reason): unwilling to participate in OOB activites         Functional mobility during ADLs: Minimal assistance (limited to rolling only) General ADL Comments: pt unwilling to participate in OOB activities     Vision   Vision Assessment?: No apparent visual deficits   Perception     Praxis      Cognition Arousal/Alertness: Awake/alert Behavior During Therapy: Flat affect Overall Cognitive Status: No family/caregiver present to determine baseline cognitive functioning                                 General Comments: pt following commands and engaging with thearpist, requires encouragement.  Pt aware of deficits and increased assist required. requires increased time for processing but able to complete tasks. oriented and aware she now has covid, eager to dc to SNF.        Exercises     Shoulder Instructions       General Comments pt supine in bed, discussed OT  goals and how she is able to complete bed level tasks with min to se tup assist--no acute role for OT at this time, pt agreeable to OT signing off    Pertinent Vitals/ Pain       Pain Assessment: No/denies pain  Home Living                                          Prior Functioning/Environment              Frequency  Min 2X/week        Progress Toward Goals  OT Goals(current goals can now be found in the care plan section)  Progress towards OT goals: Not progressing toward goals - comment (pt discharged)  Acute Rehab OT Goals Patient Stated Goal: back to [facility] OT Goal Formulation: With patient  Plan Other (comment) (goals not met, but adequate for dc due to pt continuing to refuse OOB)    Co-evaluation                 AM-PAC OT "6 Clicks" Daily Activity     Outcome Measure   Help  from another person eating meals?: None Help from another person taking care of personal grooming?: A Little Help from another person toileting, which includes using toliet, bedpan, or urinal?: Total Help from another person bathing (including washing, rinsing, drying)?: A Lot Help from another person to put on and taking off regular upper body clothing?: A Little Help from another person to put on and taking off regular lower body clothing?: Total 6 Click Score: 14    End of Session    OT Visit Diagnosis: Unsteadiness on feet (R26.81);Muscle weakness (generalized) (M62.81);Pain;Other symptoms and signs involving cognitive function   Activity Tolerance Patient tolerated treatment well   Patient Left in bed;with call bell/phone within reach   Nurse Communication Mobility status        Time: 0930-1005 OT Time Calculation (min): 35 min  Charges: OT General Charges $OT Visit: 1 Visit OT Treatments $Self Care/Home Management : 23-37 mins  [place] Pager [phone] Office [phone]    Delight Stare 02/13/2021, 10:29 AM

## 2021-02-13 NOTE — Progress Notes (Signed)
Pt has COVID positive. Test was done 02/12/21 at around 2 pm. Dr. [place] Crane, the on-call provider is notified. Pt is asymptomatic, SPO2 95% on room air, no chills, no fever, no SOB, hemodynamics stable. Airborne and contact precaution is initiated. We will continue to monitor.   Kennyth Lose, RN

## 2021-02-13 NOTE — TOC Transition Note (Signed)
Transition of Care [facility]) - CM/SW Discharge Note   Patient Details  Name: Kalayah Mazzarese MRN: [id number] Date of Birth: Jul 22, 1955  Transition of Care [facility]) CM/SW Contact:  Bethann Berkshire, [place] Phone Number: 02/13/2021, 3:03 PM   Clinical Narrative:     Patient will DC to: [facility] Anticipated DC date: 02/13/21 Family notified: Emeterio Reeve Transport by: Corey Harold   Per MD patient ready for DC to . RN, patient, patient's family, and facility notified of DC. Discharge Summary and FL2 sent to facility. RN to call report prior to discharge [id number] Room [id number]). DC packet on chart. Ambulance transport requested for patient.   CSW will sign off for now as social work intervention is no longer needed. Please consult [place] again if new needs arise.   Final next level of care: Skilled Nursing Facility Barriers to Discharge: No Barriers Identified   Patient Goals and CMS Choice        Discharge Placement              Patient chooses bed at: [facility] Patient to be transferred to facility by: [place] Name of family member notified: Emeterio Reeve daughter Patient and family notified of of transfer: 02/13/21  Discharge Plan and Services In-house Referral: Clinical Social Work                                   Social Determinants of Health ([place]) Interventions     Readmission Risk Interventions No flowsheet data found.

## 2021-02-13 NOTE — NC FL2 (Signed)
[place] LEVEL OF CARE SCREENING TOOL     IDENTIFICATION  Patient Name: Katrina Donaldson Birthdate: [birth year] Sex: female Admission Date (Current Location): 01/26/2021  [facility] and [place] Number:  Herbalist and Address:  [facility]. [facility], [place] [address], [place], [place] [address]      Provider Number: [id number]  Attending Physician Name and Address:  Elgergawy, Silver Huguenin, MD  Relative Name and Phone Number:       Current Level of Care: Hospital Recommended Level of Care: [place] Prior Approval Number: [id number]  Date Approved/Denied:   PASRR Number: [id number]  Discharge Plan: SNF    Current Diagnoses: Patient Active Problem List   Diagnosis Date Noted   Cecum mass    Abnormal computed tomography angiography (CTA) of abdomen and pelvis    Pressure injury of skin 01/27/2021   GI bleed 01/26/2021   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   Chronic anticoagulation 05/26/2017   Urinary retention    Acute pain of right knee    Acute lower UTI    Weakness of right lower extremity    Confusion    Hydrocephalus (HCC)    Poor appetite    Acute blood loss anemia    Post-operative pain    Hypokalemia    Leukocytosis    Thrombocytopenia (HCC)    Acute deep vein thrombosis (DVT) of femoral vein of left lower extremity ([place])    Myelopathy ([place]) 07/24/2016   Thoracic myelopathy    Depression    Benign essential HTN    History of subarachnoid hemorrhage    Hypercoagulable state ([place])    Constipation due to pain medication    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   Protein-calorie malnutrition ([place]) 12/21/2014   Thyroid activity decreased 12/21/2014   SAH (subarachnoid hemorrhage), 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   Essential hypertension 12/13/2014   Anemia, iron deficiency 12/13/2014   Hypothyroidism 12/13/2014   Prediabetes 12/13/2014    Orientation RESPIRATION BLADDER Height & Weight      Self, Time, Situation, Place  Normal Incontinent, External catheter Weight: 196 lb 6.9 oz (89.1 kg) Height:  5' 5.5" (166.4 cm)  BEHAVIORAL SYMPTOMS/MOOD NEUROLOGICAL BOWEL NUTRITION STATUS      Incontinent Diet (See d/c summary)  AMBULATORY STATUS COMMUNICATION OF NEEDS Skin   Extensive Assist Verbally Surgical wounds, PU Stage and Appropriate Care (Incision abdomen; Pressure injury coccyx stage 2;)                       Personal Care Assistance Level of Assistance  Bathing, Feeding, Dressing Bathing Assistance: Maximum assistance   Dressing Assistance: Independent     Functional Limitations Info  Sight, Hearing, Speech Sight Info: Impaired Hearing Info: Impaired Speech Info: Adequate    SPECIAL CARE FACTORS FREQUENCY  PT (By licensed PT), OT (By licensed OT)     PT Frequency: 5x/week OT Frequency: 5x/week            Contractures Contractures Info: Not present    Additional Factors Info  Code Status, Allergies Code Status Info: Full code Allergies Info: Shellfish-derived Products, Allergic to shellfish such as shrimp           Current Medications (02/13/2021):  This is the current hospital active medication list Current Facility-Administered Medications  Medication Dose Route Frequency Provider Last Rate Last Admin   0.9 %  sodium chloride infusion   Intravenous PRN Elgergawy, Silver Huguenin, MD  acetaminophen (TYLENOL) tablet 650 mg  650 mg Oral Q6H PRN Winferd Humphrey, PA-C   650 mg at 02/12/21 1814   albuterol (VENTOLIN HFA) 108 (90 Base) MCG/ACT inhaler 2 puff  2 puff Inhalation Once PRN Elgergawy, Silver Huguenin, MD       baclofen (LIORESAL) tablet 10 mg  10 mg Oral TID PRN Winferd Humphrey, PA-C   10 mg at 02/12/21 1814   bebtelovimab EUA injection SOLN 175 mg  175 mg Intravenous Once Elgergawy, Silver Huguenin, MD       bisacodyl (DULCOLAX) EC tablet 10 mg  10 mg Oral Daily PRN Elgergawy, Silver Huguenin, MD   10 mg at 02/11/21 2106   bisacodyl (DULCOLAX) suppository  10 mg  10 mg Rectal Once Saverio Danker, PA-C       chlorhexidine (HIBICLENS) 4 % liquid   Topical Daily PRN Winferd Humphrey, PA-C       diphenhydrAMINE (BENADRYL) injection 50 mg  50 mg Intravenous Once PRN Elgergawy, Silver Huguenin, MD       EPINEPHrine (EPI-PEN) injection 0.3 mg  0.3 mg Intramuscular Once PRN Elgergawy, Silver Huguenin, MD       famotidine (PEPCID) IVPB 20 mg premix  20 mg Intravenous Once PRN Elgergawy, Silver Huguenin, MD       famotidine (PEPCID) tablet 20 mg  20 mg Oral Daily Winferd Humphrey, PA-C   20 mg at 02/13/21 0820   feeding supplement (ENSURE ENLIVE / ENSURE PLUS) liquid 237 mL  237 mL Oral TID BM Elgergawy, Silver Huguenin, MD   237 mL at 02/13/21 0821   fluticasone (FLONASE) 50 MCG/ACT nasal spray 1 spray  1 spray Each Nare Daily PRN Winferd Humphrey, PA-C       hydrALAZINE (APRESOLINE) injection 10 mg  10 mg Intravenous Q4H PRN Winferd Humphrey, PA-C       hydrocortisone cream 1 %   Topical QID Winferd Humphrey, PA-C   Given at 02/13/21 [id number]   hydrOXYzine (ATARAX/VISTARIL) tablet 25 mg  25 mg Oral TID Winferd Humphrey, PA-C   25 mg at 02/13/21 0820   lactated ringers infusion   Intravenous Continuous Winferd Humphrey, PA-C 10 mL/hr at 02/01/21 1512 New Bag at 02/01/21 1512   levothyroxine (SYNTHROID) tablet 125 mcg  125 mcg Oral QAC breakfast Winferd Humphrey, PA-C   125 mcg at 02/13/21 [id number]   magnesium hydroxide (MILK OF MAGNESIA) suspension 30 mL  30 mL Oral Once Elgergawy, Silver Huguenin, MD       magnesium sulfate IVPB 1 g 100 mL  1 g Intravenous Once Elgergawy, Silver Huguenin, MD       menthol-cetylpyridinium (CEPACOL) lozenge 3 mg  1 lozenge Oral PRN Amin, Ankit Chirag, MD       methylPREDNISolone sodium succinate (SOLU-MEDROL) 125 mg/2 mL injection 125 mg  125 mg Intravenous Once PRN Elgergawy, Silver Huguenin, MD       metoprolol tartrate (LOPRESSOR) injection 5 mg  5 mg Intravenous Q4H PRN Winferd Humphrey, PA-C       metoprolol tartrate (LOPRESSOR) tablet 25 mg  25 mg Oral BID Winferd Humphrey, PA-C   25 mg at 02/13/21 0820   multivitamin with minerals tablet 1 tablet  1 tablet Oral Daily Elgergawy, Silver Huguenin, MD   1 tablet at 02/13/21 0820   ondansetron (ZOFRAN) injection 4 mg  4 mg Intravenous Q6H PRN Winferd Humphrey, PA-C       pantoprazole (PROTONIX) EC tablet 40  mg  40 mg Oral BID Amin, Ankit Chirag, MD   40 mg at 02/13/21 0819   PARoxetine (PAXIL) tablet 20 mg  20 mg Oral Daily Winferd Humphrey, PA-C   20 mg at 02/13/21 0820   polyethylene glycol (MIRALAX / GLYCOLAX) packet 17 g  17 g Oral BID Elgergawy, Silver Huguenin, MD   17 g at 02/13/21 [id number]   polyvinyl alcohol (LIQUIFILM TEARS) 1.4 % ophthalmic solution 1 drop  1 drop Both Eyes PRN Winferd Humphrey, PA-C   1 drop at 02/06/21 [id number]   rivaroxaban (XARELTO) tablet 20 mg  20 mg Oral Q supper Amin, Ankit Chirag, MD   20 mg at 02/12/21 1810   senna-docusate (Senokot-S) tablet 1 tablet  1 tablet Oral QHS PRN Winferd Humphrey, PA-C         Discharge Medications: Please see discharge summary for a list of discharge medications.  Relevant Imaging Results:  Relevant Lab Results:   Additional Information SS#: [id number] Pt is covid positive  Zitlali Primm [employer], LCSW

## 2021-02-13 NOTE — Progress Notes (Signed)
Mid lower abdominal incision has copious purulent drainage oozing out. Dry dressing changed twice. Denies pain. Remains afebrile, hemodynamics stable, NSR on monitor, on room air SPO2 99%. No distress noted.  Pt refused Miralax at bedtime tonight. She had one small dark red bowel movement one time tonight. Hypoactive bowel sound all 4 quadrants. Pt stated she has low appetite. She has not eaten very much since post surgery. She refused Ensure.   Her CBG q 6 hrs: 90->90->84->129 mg/dl. No signs of hypoglycemia. We will continue to monitor.  Kennyth Lose, RN

## 2021-02-13 NOTE — Progress Notes (Signed)
12 Days Post-Op   Subjective/Chief Complaint: Had a small BM yesterday.  Still eating some, but not a ton.  No other complaints.  COVID +   Objective: Vital signs in last 24 hours: Temp:  [98.1 F (36.7 C)-98.4 F (36.9 C)] 98.1 F (36.7 C) (08/16 0800) Pulse Rate:  [77-88] 88 (08/16 0800) Resp:  [18-20] 19 (08/16 0800) BP: (113-162)/(77-93) 146/85 (08/16 0800) SpO2:  [96 %-99 %] 98 % (08/16 0800) Last BM Date:  (Prior this admission)  Intake/Output from previous day: 08/15 0701 - 08/16 0700 In: 500 [P.O.:500] Out: -  Intake/Output this shift: No intake/output data recorded.  Abd: soft, nondistended, nontender. Midline incision with staples removed and some purulent drainage noted so wound opened up more.  This was then packed with a NS WD dressing    Lab Results:  Recent Labs    02/12/21 1604  WBC 7.9  HGB 10.3*  HCT 32.4*  PLT 403*    BMET No results for input(s): NA, K, CL, CO2, GLUCOSE, BUN, CREATININE, CALCIUM in the last 72 hours.  PT/INR No results for input(s): LABPROT, INR in the last 72 hours. ABG No results for input(s): PHART, HCO3 in the last 72 hours.  Invalid input(s): PCO2, PO2  Studies/Results: No results found.  Anti-infectives: Anti-infectives (From admission, onward)    Start     Dose/Rate Route Frequency Ordered Stop   02/01/21 0600  cefoTEtan (CEFOTAN) 2 g in sodium chloride 0.9 % 100 mL IVPB        2 g 200 mL/hr over 30 Minutes Intravenous On call to O.R. 01/31/21 1450 02/01/21 1013       Assessment/Plan: POD 12, S/P laparoscopic assisted R colectomy 8/5 by Dr. 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  GI has placed on recall list for 3 years -soft diet, Ensure, miralax, dulcolax suppository -wheelchair bound, but mobilize as able to chair etc with therapies -incentive spirometer -tolerating full-dose anticoagulation, hgb stable -Continue to  encourage PO intake, dispo planning -DC staples yesterday with unexpected wound infection.  This had been evacuated and will start BID dressing changes.  No abx therapy needed -surgically stable for DC to SNF as able pending COVID + result. -will follow peripherally right now while awaiting placement.  FEN - soft, ensure, miralax VTE - SCDs, xarelto ID - none DIspo:stable for dispo whenever ready medically   H/O DVT  HTN Hypothyroidism Wheelchair bound   Henreitta Cea 02/13/2021

## 2021-02-13 NOTE — Discharge Summary (Addendum)
Physician Discharge Summary  Katrina Donaldson [id number] DOB: 08/29/1955 DOA: 01/26/2021  PCP: Donald Prose, MD  Admit date: 01/26/2021 Discharge date: 02/13/2021  Admitted From: SNF Disposition:  [facility]  Recommendations for Outpatient Follow-up:  To follow-up with SNF physician in 3 days Please obtain BMP/CBC in one week Continue with wound dressing with a dry twice daily Please encourage patient to have oral intake Continue with laxatives as needed to have at least 1 bowel movement every 3 days.   Discharge Condition: Stable CODE STATUS: FULL Diet recommendation: soft with clear liquid  Brief/Interim Summary:  Cecal mass/Tubulovillous adenoma with high-grade dysplasia Lower GI bleed secondary to cecal mass Severe anemia secondary to GI bleed Hemoglobin stable since transfusion OF 4 units prbc. S/p colonoscopy with biopsy and status post laparoscopic colectomy Patient had CTA negative for active bleeding, status post EGD, CT chest abdomen pelvis negative for metastasis.  Primary surgery continue PPI, awaiting return of bowel function, augment nutrition.   -staples likely will be discontinued 8/15, with some wound infection and purulent discharge noted, she was seen by general surgery, wound has been evacuated, and recommendation is for twice daily dressing changes, no antibiotics needed.  - Path shows a tubular adenoma with high-grade dysplasia but no malignancy, margins free of dysplasia.  GI has placed on recall list for 3 years   Anemia from GI bleed as above. 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  Seen by surgery    Maculopapular rash/allergic dermatitis-resolved w/ steroid   Hypokalemia resolved   Anxiety/depression: Mood somewhat appears low but has  generalized weakness.  Continue her paxil   Essential hypertension: Well-controlled.  Continue Lopressor 25 twice daily   History of DVT in 2018: Continue Xarelto   Hypothyroidism: Continue her Synthroid   Obesity: Will benefit with weight loss,    Pressure wound as below in coccyX POA  COVID 19 positive -Screening at time of discharge significant for positive COVID-19, she denies dyspnea, afebrile, she is asymptomatic. She was offered monoclonal antibody, but patient refused IV access for last few days, so she did not have an IV access, and she was adamant about not having new IV access initiated, so she could not receive monoclonal antibody.     Discharge Diagnoses:  Active Problems:   GI bleed   Pressure injury of skin   Abnormal computed tomography angiography (CTA) of abdomen and pelvis   Cecum mass    Discharge Instructions  Discharge Instructions     Diet - low sodium heart healthy   Complete by: As directed    Discharge instructions   Complete by: As directed    Follow with Primary MD Donald Prose, MD/SNF physician  Get CBC, CMP,  checked  by Primary MD next visit.     Disposition SNF  Diet: Soft with thin liquid  For Heart failure patients - Check your Weight same time everyday, if you gain over 2 pounds, or you develop in leg swelling, experience more shortness of breath or chest pain, call your Primary MD immediately. Follow Cardiac Low Salt Diet and 1.5 lit/day fluid restriction.   On your next visit with your primary care physician please Get Medicines reviewed and adjusted.   Please request your Prim.MD to  go over all Hospital Tests and Procedure/Radiological results at the follow up, please get all Hospital records sent to your Prim MD by signing hospital release before you go home.   If you experience worsening of your admission symptoms, develop shortness of breath, life threatening emergency, suicidal or homicidal thoughts you must seek medical  attention immediately by calling 911 or calling your MD immediately  if symptoms less severe.  You Must read complete instructions/literature along with all the possible adverse reactions/side effects for all the Medicines you take and that have been prescribed to you. Take any new Medicines after you have completely understood and accpet all the possible adverse reactions/side effects.   Do not drive, operating heavy machinery, perform activities at heights, swimming or participation in water activities or provide baby sitting services if your were admitted for syncope or siezures until you have seen by Primary MD or a Neurologist and advised to do so again.  Do not drive when taking Pain medications.    Do not take more than prescribed Pain, Sleep and Anxiety Medications  Special Instructions: If you have smoked or chewed Tobacco  in the last 2 yrs please stop smoking, stop any regular Alcohol  and or any Recreational drug use.  Wear Seat belts while driving.   Please note  You were cared for by a hospitalist during your hospital stay. If you have any questions about your discharge medications or the care you received while you were in the hospital after you are discharged, you can call the unit and asked to speak with the hospitalist on call if the hospitalist that took care of you is not available. Once you are discharged, your primary care physician will handle any further medical issues. Please note that NO REFILLS for any discharge medications will be authorized once you are discharged, as it is imperative that you return to your primary care physician (or establish a relationship with a primary care physician if you do not have one) for your aftercare needs so that they can reassess your need for medications and monitor your lab values.   Discharge wound care:   Complete by: As directed    Please have wet-to-dry dressing change of abdominal incision twice daily.   Increase activity  slowly   Complete by: As directed       Allergies as of 02/13/2021       Reactions   Other Other (See Comments)   Allergic to shellfish such as shrimp   Shellfish-derived Products         Medication List     STOP taking these medications    amLODipine 10 MG tablet Commonly known as: NORVASC   famotidine 40 MG tablet Commonly known as: PEPCID   gabapentin 100 MG capsule Commonly known as: NEURONTIN   gabapentin 400 MG capsule Commonly known as: NEURONTIN   gabapentin 600 MG tablet Commonly known as: NEURONTIN   hydrochlorothiazide 12.5 MG capsule Commonly known as: MICROZIDE   metoprolol succinate 100 MG 24 hr tablet Commonly known as: TOPROL-XL       TAKE these medications    acetaminophen 325 MG tablet Commonly known as: TYLENOL Take 2 tablets (650 mg total) by mouth every 6 (six) hours as needed for mild pain, fever or headache. What changed:  when to take this reasons to take this   antiseptic oral rinse Liqd 30 mLs by Mouth Rinse route 3 (three) times daily as needed for dry mouth.   Artificial Tears 0.1-0.3 %  Soln Generic drug: Dextran 70-Hypromellose Place 2 drops into both eyes in the morning, at noon, and at bedtime.   GenTeal Tears 0.1-0.3 % Soln Generic drug: Dextran 70-Hypromellose Place 2 drops into both eyes at bedtime.   Artificial Tears 0.2-0.2-1 % Soln Generic drug: Glycerin-Hypromellose-PEG 400 Place 1 drop into both eyes every 2 (two) hours as needed (dry eyes).   atorvastatin 10 MG tablet Commonly known as: LIPITOR Take 10 mg by mouth at bedtime.   azelastine 0.1 % nasal spray Commonly known as: ASTELIN Place 2 sprays into both nostrils 2 (two) times daily.   baclofen 10 MG tablet Commonly known as: LIORESAL Take 5-10 mg by mouth 3 (three) times daily as needed for muscle spasms. '5mg'$  daily, an additional '10mg'$  in the afternoon as needed, and '5mg'$  in the evening as needed for muscle spasms.   Biofreeze 4 % Gel Generic  drug: Menthol (Topical Analgesic) Apply 1 application topically in the morning and at bedtime. For shoulder pain   bisacodyl 5 MG EC tablet Commonly known as: DULCOLAX Take 2 tablets (10 mg total) by mouth daily as needed for moderate constipation.   bisacodyl 10 MG suppository Commonly known as: DULCOLAX Place 1 suppository (10 mg total) rectally once for 1 dose.   feeding supplement Liqd Take 237 mLs by mouth 3 (three) times daily between meals.   fluticasone 50 MCG/ACT nasal spray Commonly known as: FLONASE Place 1 spray into both nostrils daily as needed for allergies.   folic acid 1 MG tablet Commonly known as: FOLVITE Take 1 mg by mouth daily.   hydrOXYzine 25 MG tablet Commonly known as: ATARAX/VISTARIL Take 1 tablet (25 mg total) by mouth 3 (three) times daily.   levothyroxine 125 MCG tablet Commonly known as: SYNTHROID Take 125 mcg by mouth daily before breakfast.   magnesium hydroxide 400 MG/5ML suspension Commonly known as: MILK OF MAGNESIA Take 30 mLs by mouth once for 1 dose.   metoprolol tartrate 25 MG tablet Commonly known as: LOPRESSOR Take 1 tablet (25 mg total) by mouth 2 (two) times daily. What changed:  medication strength how much to take   multivitamin with minerals Tabs tablet Take 1 tablet by mouth daily.   Olopatadine HCl 0.2 % Soln Place 2 drops into both eyes daily.   pantoprazole 40 MG tablet Commonly known as: PROTONIX Take 1 tablet (40 mg total) by mouth 2 (two) times daily.   PARoxetine 10 MG tablet Commonly known as: PAXIL Take 10 mg by mouth daily.   polyethylene glycol powder 17 GM/SCOOP powder Commonly known as: GLYCOLAX/MIRALAX Take 17 g by mouth daily as needed for mild constipation.   rivaroxaban 20 MG Tabs tablet Commonly known as: XARELTO Take 1 tablet (20 mg total) by mouth daily with supper. What changed: when to take this   sennosides-docusate sodium 8.6-50 MG tablet Commonly known as: SENOKOT-S Take 2  tablets by mouth daily.   Simethicone 180 MG Caps Take 180-360 mg by mouth in the morning, at noon, in the evening, and at bedtime. '180mg'$  three times daily continuously, and '360mg'$  additionally as needed for flatulence.   sodium chloride 0.65 % Soln nasal spray Commonly known as: OCEAN Place 2 sprays into both nostrils 3 (three) times daily as needed for congestion.               Discharge Care Instructions  (From admission, onward)           Start     Ordered   02/13/21  0000  Discharge wound care:       Comments: Please have wet-to-dry dressing change of abdominal incision twice daily.   02/13/21 1328            Follow-up Information     Georganna Skeans, MD Follow up on 03/16/2021.   Specialty: General Surgery Why: 3:40 pm, arrive by 3:10pm for paperwork and check in process Contact information: [place] [address] [phone]                Allergies  Allergen Reactions   Other Other (See Comments)    Allergic to shellfish such as shrimp    Shellfish-Derived Products     Consultations: GI General surgery   Procedures/Studies: CT CHEST ABDOMEN PELVIS W CONTRAST  Result Date: 02/01/2021 CLINICAL DATA:  65 year old female with history of large cecal mass scheduled for right colectomy. Abdominal pain. EXAM: CT CHEST, ABDOMEN, AND PELVIS WITH CONTRAST TECHNIQUE: Multidetector CT imaging of the chest, abdomen and pelvis was performed following the standard protocol during bolus administration of intravenous contrast. CONTRAST:  163m OMNIPAQUE IOHEXOL 300 MG/ML  SOLN COMPARISON:  CTA of the abdomen and pelvis 01/27/2021. FINDINGS: CT CHEST FINDINGS Cardiovascular: Heart size is normal. There is no significant pericardial fluid, thickening or pericardial calcification. There is aortic atherosclerosis, as well as atherosclerosis of the great vessels of the mediastinum and the coronary arteries, including calcified atherosclerotic  plaque in the left anterior descending coronary artery. Mediastinum/Nodes: No pathologically enlarged mediastinal or hilar lymph nodes. Large hiatal hernia. No axillary lymphadenopathy. Lungs/Pleura: Marked elevation of the left hemidiaphragm with extensive passive atelectasis in the left lower lobe. No acute consolidative airspace disease. No pleural effusions. No suspicious appearing pulmonary nodules or masses are noted. Musculoskeletal: There are no aggressive appearing lytic or blastic lesions noted in the visualized portions of the skeleton. CT ABDOMEN PELVIS FINDINGS Hepatobiliary: No suspicious cystic or solid hepatic lesions. No intra or extrahepatic biliary ductal dilatation. Some amorphous high attenuation material lies dependently in the lumen of the gallbladder, compatible with biliary sludge and/or vicarious excretion of contrast material related to prior contrast enhanced CT exam 01/27/2021. Gallbladder is not distended. No pericholecystic fluid or surrounding inflammatory changes. Pancreas: No pancreatic mass. No pancreatic ductal dilatation. No pancreatic or peripancreatic fluid collections or inflammatory changes. Spleen: Unremarkable. Adrenals/Urinary Tract: Tiny 2-3 mm nonobstructive calculi are noted within the lower pole collecting system of left kidney. Right kidney and bilateral adrenal glands are normal in appearance. No hydroureteronephrosis. Urinary bladder is normal in appearance. Stomach/Bowel: Stomach is nearly completely intrathoracic within the patient's large hiatal hernia. No pathologic dilatation of small bowel or colon. Soft tissue mass in the region of the cecum (axial image 69 of series 3 and coronal image 61 of series 6) measuring 3.6 x 4.1 x 4.2 cm. Normal appendix. Vascular/Lymphatic: Aortic atherosclerosis, without evidence of aneurysm or dissection in the abdominal or pelvic vasculature. No lymphadenopathy noted in the abdomen or pelvis. Reproductive: Large densely  calcified lesion extending off the left side of the uterine fundus, compatible with a large calcified subserosal fibroid. Ovaries are atrophic. Other: No significant volume of ascites. No pneumoperitoneum. Chronic presacral edema, similar to the prior study. Musculoskeletal: There are no aggressive appearing lytic or blastic lesions noted in the visualized portions of the skeleton. IMPRESSION: 1. Soft tissue mass in the region of the cecum estimated measure 3.6 x 4.1 x 4.2 cm. No definite metastatic disease confidently identified in the chest, abdomen or pelvis.  2. Severe elevation of the left hemidiaphragm. Large hiatal hernia with intrathoracic stomach. 3. Probable chronic passive atelectasis in the left lower lobe related to the elevated left hemidiaphragm. 4. Two tiny 2-3 mm nonobstructive calculi in the lower pole collecting system of left kidney. 5. Aortic atherosclerosis, in addition to left anterior descending coronary artery disease. Please note that although the presence of coronary artery calcium documents the presence of coronary artery disease, the severity of this disease and any potential stenosis cannot be assessed on this non-gated CT examination. Assessment for potential risk factor modification, dietary therapy or pharmacologic therapy may be warranted, if clinically indicated. 6. Additional incidental findings, as above. Electronically Signed   By: Vinnie Langton M.D.   On: 02/01/2021 10:57   CT ANGIO GI BLEED  Result Date: 01/27/2021 CLINICAL DATA:  65 year old female with profound anemia and maroon stool with positive fecal occult blood test. EXAM: CTA ABDOMEN AND PELVIS WITHOUT AND WITH CONTRAST TECHNIQUE: Multidetector CT imaging of the abdomen and pelvis was performed using the standard protocol during bolus administration of intravenous contrast. Multiplanar reconstructed images and MIPs were obtained and reviewed to evaluate the vascular anatomy. CONTRAST:  17m OMNIPAQUE IOHEXOL  350 MG/ML SOLN COMPARISON:  No prior CT Abdomen and Pelvis portable abdomen radiograph 07/21/2016. FINDINGS: VASCULAR Mildly tortuous descending thoracic aorta. Normal abdominal aorta. Mild to moderate bilateral iliac artery atherosclerosis. Major arterial structures are patent. No abdominal dissection or aneurysm. Central venous structures appear to be patent on the delayed images. Review of the MIP images confirms the above findings. NON-VASCULAR Lower chest: Large hiatal hernia containing most of the stomach and the entire splenic flexure. Cardiac size remains within normal limits. Tortuous descending thoracic aorta. No pericardial or pleural effusion. Confluent enhancing left lower lobe atelectasis associated with the hernia. Negative right lung base. Hepatobiliary: Negative liver and gallbladder. No bile duct enlargement. Portal venous system appears to be patent on the delayed images. Pancreas: Negative. Spleen: Negative. Adrenals/Urinary Tract: Mild left lower pole nephrolithiasis. Otherwise negative. Normal adrenal glands. Mildly distended urinary bladder. Stomach/Bowel: There is posterior perirectal inflammatory stranding as seen on series 9, image 192 with mild underlying posterior rectal wall thickening. No discrete mass or hyperenhancement. The upstream rectum has a more normal appearance. Redundant sigmoid colon tracks into the epigastrium, but with minimal diverticulosis and no active inflammation. Negative descending colon. Splenic flexure contained within a large left diaphragmatic hernia, within normal limits. Negative hepatic flexure. There is mild nonspecific fibrofatty proliferation of the cecum, but no active inflammation. Normal appendix on coronal series 12, image 44. Negative terminal ileum. No dilated small bowel. Mostly intrathoracic stomach related to hernia. Duodenum appears negative. No free air or free fluid. No gastrointestinal contrast extravasation identified. Lymphatic: No  lymphadenopathy. Reproductive: Coarsely calcified exophytic and subserosal 5.3 cm left uterine fibroid. Otherwise negative. Other: Confluent but nonspecific presacral stranding. No pelvic free fluid. Musculoskeletal: Widespread prior lower thoracic posterior laminectomy, decompression. Chronic lower lumbar disc space loss, obliteration with grade 1 anterolisthesis and interbody and posterior element ankylosis. Left side lumbosacral ankylosis. 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 Associated pseudoarthrosis with the left anterior iliac wing. No acute osseous abnormality identified. IMPRESSION: 1. Negative abdominal aorta. 2. There is presacral inflammation with mild focal wall thickening of the posterior rectum suggesting Acute Proctitis. But no source of active GI bleeding is identified by CTA. Redundant large bowel but minimal diverticula.  Large hiatal hernia containing most of the stomach and the entire splenic flexure, but no associated bowel obstruction or other complicating features at this time. Nonspecific fibrofatty proliferation of the cecum. 3.  Calcified uterine fibroid. Left nephrolithiasis. ChronicPostoperative changes to the lower thoracic spine posterior elements. Spondylolisthesis with obliterated disc space and ankylosis at L4-L5. 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 Electronically Signed   By: Genevie Ann M.D.   On: 01/27/2021 08:09      Subjective:  Patient denies any complaints today, no chest pain, no shortness of breath, no fever, no cough, no abdominal pain discharge Exam: Vitals:   02/13/21 0800 02/13/21 1302  BP: (!) 146/85 129/82  Pulse: 88 80  Resp: 19 20  Temp: 98.1 F (36.7 C)   SpO2: 98% 98%    Vitals:   02/12/21 2340 02/13/21 0535 02/13/21 0800 02/13/21 1302  BP: 118/77 (!) 152/93 (!) 146/85 129/82  Pulse: 77 78 88 80  Resp: '18 20 19 20  '$ Temp: 98.4 F (36.9 C) 98.4 F (36.9 C) 98.1 F (36.7 C)   TempSrc: Oral Oral Oral Oral  SpO2: 97% 99% 98% 98%  Weight:      Height:        General: Pt is alert, awake, not in acute distress Cardiovascular: RRR, S1/S2 +, no rubs, no gallops Respiratory: CTA bilaterally, no wheezing, no rhonchi Abdominal: Soft, NT, ND, bowel sounds +, incision wound is packed Extremities: no edema, no cyanosis    The results of significant diagnostics from this hospitalization (including imaging, microbiology, ancillary and laboratory) are listed below for reference.     Microbiology: Recent Results (from the past 240 hour(s))  SARS CORONAVIRUS 2 (TAT 6-24 HRS) Nasopharyngeal Nasopharyngeal Swab     Status: Abnormal   Collection Time: 02/12/21  1:54 PM   Specimen: Nasopharyngeal Swab  Result Value Ref Range Status   SARS Coronavirus 2 POSITIVE (A) NEGATIVE Final    Comment: (NOTE) SARS-CoV-2 target nucleic acids are DETECTED.  The SARS-CoV-2 RNA is generally detectable in upper and lower respiratory specimens during the acute phase of infection. Positive results are indicative of the presence of SARS-CoV-2 RNA. Clinical correlation with patient history and other diagnostic information is  necessary to determine patient infection status. Positive results do not rule out bacterial infection or co-infection with other viruses.  The expected result is Negative.  Fact Sheet for Patients: SugarRoll.be  Fact Sheet for Healthcare Providers: https://www.woods-mathews.com/  This test is not yet approved or cleared by the [place] FDA and  has been authorized for detection and/or diagnosis of SARS-CoV-2 by FDA under an Emergency Use Authorization (EUA). This EUA will remain  in effect (meaning this  test can be used) for the duration of the COVID-19 declaration under Section 564(b)(1) of the Act, 21 U. S.C. section 360bbb-3(b)(1), unless the authorization is terminated or revoked sooner.   Performed at [facility], [place] [address] [place], [place] [address]   Resp Panel by RT-PCR (Flu A&B, Covid) Nasopharyngeal Swab     Status: Abnormal   Collection Time: 02/13/21  9:44 AM   Specimen: Nasopharyngeal Swab; Nasopharyngeal(NP) swabs in vial transport medium  Result Value Ref Range Status   SARS Coronavirus 2 by RT PCR POSITIVE (A) NEGATIVE Final    Comment: RESULT CALLED TO, READ BACK BY AND VERIFIED WITH: JESSICA BEARS RN 02/13/2021 '@1142'$  BY JW (NOTE) SARS-CoV-2 target nucleic acids are DETECTED.  The SARS-CoV-2 RNA is generally detectable in upper respiratory specimens during the acute phase of infection. Positive results are indicative of the presence of the identified virus, but do not rule out bacterial infection or co-infection with other pathogens not detected by the test. Clinical correlation with patient history and other diagnostic information is necessary to determine patient infection status. The expected result is Negative.  Fact Sheet for Patients: EntrepreneurPulse.com.au  Fact Sheet for Healthcare Providers: IncredibleEmployment.be  This test is not yet approved or cleared by the [place] FDA and  has been authorized for detection and/or diagnosis of SARS-CoV-2 by FDA under an Emergency Use Authorization (EUA).  This EUA will remain in effect (meaning this test c an be used) for the duration of  the COVID-19 declaration under Section 564(b)(1) of the Act, 21 U.S.C. section 360bbb-3(b)(1), unless the authorization is terminated or revoked sooner.     Influenza A by PCR NEGATIVE NEGATIVE Final   Influenza B by PCR NEGATIVE NEGATIVE Final    Comment: (NOTE) The Xpert Xpress SARS-CoV-2/FLU/RSV plus assay is  intended as an aid in the diagnosis of influenza from Nasopharyngeal swab specimens and should not be used as a sole basis for treatment. Nasal washings and aspirates are unacceptable for Xpert Xpress SARS-CoV-2/FLU/RSV testing.  Fact Sheet for Patients: EntrepreneurPulse.com.au  Fact Sheet for Healthcare Providers: IncredibleEmployment.be  This test is not yet approved or cleared by the [place] FDA and has been authorized for detection and/or diagnosis of SARS-CoV-2 by FDA under an Emergency Use Authorization (EUA). This EUA will remain in effect (meaning this test can be used) for the duration of the COVID-19 declaration under Section 564(b)(1) of the Act, 21 U.S.C. section 360bbb-3(b)(1), unless the authorization is terminated or revoked.  Performed at [facility], [place] [address], [place], [place] [address]      Labs: BNP (last 3 results) No results for input(s): BNP in the last 8760 hours. Basic Metabolic Panel: Recent Labs  Lab 02/07/21 0604 02/08/21 0113 02/09/21 0101 02/10/21 0431  NA 133* 136 137 138  K 3.9 3.6 3.4* 3.5  CL 103 105 105 106  CO2 20* '24 25 24  '$ GLUCOSE 88 93 93 84  BUN <5* <5* <5* <5*  CREATININE 0.56 0.58 0.59 0.60  CALCIUM 7.9* 8.1* 7.9* 8.0*  MG 2.0 1.9 1.7  --    Liver Function Tests: No results for input(s): AST, ALT, ALKPHOS, BILITOT, PROT, ALBUMIN in the last 168 hours. No results for input(s): LIPASE, AMYLASE in the last 168 hours. No results for input(s): AMMONIA in the last 168 hours. CBC: Recent Labs  Lab 02/07/21 0604 02/08/21 0113 02/09/21 0101 02/12/21 1604  WBC 6.8 6.1 6.0 7.9  HGB 9.9* 9.5* 9.2* 10.3*  HCT 31.2* 30.5* 30.2* 32.4*  MCV 85.0 86.2 86.3 85.3  PLT 158 202 238 403*   Cardiac Enzymes: No results for input(s): CKTOTAL, CKMB, CKMBINDEX, TROPONINI in the last 168 hours. BNP: Invalid input(s): POCBNP CBG: Recent Labs  Lab 02/12/21 0617 02/12/21 1147  02/12/21 2336 02/13/21 0616 02/13/21 1303  GLUCAP 90 90 84 129* 83   D-Dimer No results for input(s): DDIMER in the last 72 hours. Hgb A1c No results for input(s): HGBA1C in the last 72 hours. Lipid Profile No results for input(s): CHOL, HDL, LDLCALC, TRIG, CHOLHDL, LDLDIRECT in the last 72 hours. Thyroid function studies No results for input(s): TSH, T4TOTAL, T3FREE, THYROIDAB in the last 72 hours.  Invalid input(s): FREET3 Anemia work up No results  for input(s): VITAMINB12, FOLATE, FERRITIN, TIBC, IRON, RETICCTPCT in the last 72 hours. Urinalysis    Component Value Date/Time   COLORURINE YELLOW 08/12/2016 0059   APPEARANCEUR CLEAR 08/12/2016 0059   LABSPEC 1.015 08/12/2016 0059   PHURINE 5.0 08/12/2016 0059   GLUCOSEU NEGATIVE 08/12/2016 0059   HGBUR NEGATIVE 08/12/2016 0059   BILIRUBINUR NEGATIVE 08/12/2016 0059   KETONESUR NEGATIVE 08/12/2016 0059   PROTEINUR NEGATIVE 08/12/2016 0059   NITRITE NEGATIVE 08/12/2016 0059   LEUKOCYTESUR NEGATIVE 08/12/2016 0059   Sepsis Labs Invalid input(s): PROCALCITONIN,  WBC,  LACTICIDVEN Microbiology Recent Results (from the past 240 hour(s))  SARS CORONAVIRUS 2 (TAT 6-24 HRS) Nasopharyngeal Nasopharyngeal Swab     Status: Abnormal   Collection Time: 02/12/21  1:54 PM   Specimen: Nasopharyngeal Swab  Result Value Ref Range Status   SARS Coronavirus 2 POSITIVE (A) NEGATIVE Final    Comment: (NOTE) SARS-CoV-2 target nucleic acids are DETECTED.  The SARS-CoV-2 RNA is generally detectable in upper and lower respiratory specimens during the acute phase of infection. Positive results are indicative of the presence of SARS-CoV-2 RNA. Clinical correlation with patient history and other diagnostic information is  necessary to determine patient infection status. Positive results do not rule out bacterial infection or co-infection with other viruses.  The expected result is Negative.  Fact Sheet for  Patients: SugarRoll.be  Fact Sheet for Healthcare Providers: https://www.woods-mathews.com/  This test is not yet approved or cleared by the [place] FDA and  has been authorized for detection and/or diagnosis of SARS-CoV-2 by FDA under an Emergency Use Authorization (EUA). This EUA will remain  in effect (meaning this test can be used) for the duration of the COVID-19 declaration under Section 564(b)(1) of the Act, 21 U. S.C. section 360bbb-3(b)(1), unless the authorization is terminated or revoked sooner.   Performed at [facility], [place] [address] [place], [place] [address]   Resp Panel by RT-PCR (Flu A&B, Covid) Nasopharyngeal Swab     Status: Abnormal   Collection Time: 02/13/21  9:44 AM   Specimen: Nasopharyngeal Swab; Nasopharyngeal(NP) swabs in vial transport medium  Result Value Ref Range Status   SARS Coronavirus 2 by RT PCR POSITIVE (A) NEGATIVE Final    Comment: RESULT CALLED TO, READ BACK BY AND VERIFIED WITH: JESSICA BEARS RN 02/13/2021 '@1142'$  BY JW (NOTE) SARS-CoV-2 target nucleic acids are DETECTED.  The SARS-CoV-2 RNA is generally detectable in upper respiratory specimens during the acute phase of infection. Positive results are indicative of the presence of the identified virus, but do not rule out bacterial infection or co-infection with other pathogens not detected by the test. Clinical correlation with patient history and other diagnostic information is necessary to determine patient infection status. The expected result is Negative.  Fact Sheet for Patients: EntrepreneurPulse.com.au  Fact Sheet for Healthcare Providers: IncredibleEmployment.be  This test is not yet approved or cleared by the [place] FDA and  has been authorized for detection and/or diagnosis of SARS-CoV-2 by FDA under an Emergency Use Authorization (EUA).  This EUA will remain in effect  (meaning this test c an be used) for the duration of  the COVID-19 declaration under Section 564(b)(1) of the Act, 21 U.S.C. section 360bbb-3(b)(1), unless the authorization is terminated or revoked sooner.     Influenza A by PCR NEGATIVE NEGATIVE Final   Influenza B by PCR NEGATIVE NEGATIVE Final    Comment: (NOTE) The Xpert Xpress SARS-CoV-2/FLU/RSV plus assay is intended as an aid in the diagnosis of  influenza from Nasopharyngeal swab specimens and should not be used as a sole basis for treatment. Nasal washings and aspirates are unacceptable for Xpert Xpress SARS-CoV-2/FLU/RSV testing.  Fact Sheet for Patients: EntrepreneurPulse.com.au  Fact Sheet for Healthcare Providers: IncredibleEmployment.be  This test is not yet approved or cleared by the [place] FDA and has been authorized for detection and/or diagnosis of SARS-CoV-2 by FDA under an Emergency Use Authorization (EUA). This EUA will remain in effect (meaning this test can be used) for the duration of the COVID-19 declaration under Section 564(b)(1) of the Act, 21 U.S.C. section 360bbb-3(b)(1), unless the authorization is terminated or revoked.  Performed at [facility], [place] [address], [place], [place] [address]      Time coordinating discharge: Over 30 minutes  SIGNED:   Phillips Climes, MD  [facility] 02/13/2021, 1:28 PM Pager   If 7PM-7AM, please contact night-coverage [email or web address] Password [email or web address]

## 2021-02-14 NOTE — Progress Notes (Signed)
Pt's transferred to [facility] place at 12:30 am. by two staff from [place]. All of Pt's belongings including per personal handbag and her yellow metal with clear-cut stone-head ring gave back to Pt. AVS put in an envelope and gave to a staff from [place]. Report given to [facility] staff from [place] place.   Pt is alert and fully oriented. She is hemodynamically stable, afebrile, no distress noted. Her incision on mid abdominal dressing clean and dry. Dressing changed by RN day shift at 16:00 am.   Kennyth Lose, RN

## 2021-06-07 ENCOUNTER — Telehealth: Payer: Self-pay

## 2021-06-07 NOTE — Telephone Encounter (Signed)
Spoke with nurse at [facility] place to request bone biopsy and culture results. She states they do not have them.  RN called back and left voicemail with patient's social worker at [employer] as she may be able to help locate records. Awaiting return call.   Beryle Flock, RN

## 2021-06-12 ENCOUNTER — Ambulatory Visit: Payer: Medicare PPO | Admitting: Internal Medicine

## 2021-06-13 ENCOUNTER — Ambulatory Visit: Payer: Medicare PPO | Admitting: Internal Medicine

## 2021-06-13 NOTE — Progress Notes (Signed)
Patient ID: Katrina Donaldson, female   DOB: 1956/03/26, 66 y.o.   MRN: [id number] New Referral : [facility] left message for [place] Dec at [phone] ext [phone] that before patients next appointment we need the results of the Bone biopsy and bone Cx done on 05-29-21  along with progress notes, medication  and how long sacral wound has been present

## 2021-06-21 ENCOUNTER — Encounter: Payer: Self-pay | Admitting: Infectious Diseases

## 2021-06-21 ENCOUNTER — Other Ambulatory Visit: Payer: Self-pay

## 2021-06-21 ENCOUNTER — Ambulatory Visit (INDEPENDENT_AMBULATORY_CARE_PROVIDER_SITE_OTHER): Payer: Medicare PPO | Admitting: Infectious Diseases

## 2021-06-21 VITALS — BP 131/83 | HR 90 | Temp 96.7°F

## 2021-06-21 DIAGNOSIS — Z5181 Encounter for therapeutic drug level monitoring: Secondary | ICD-10-CM

## 2021-06-21 DIAGNOSIS — M4628 Osteomyelitis of vertebra, sacral and sacrococcygeal region: Secondary | ICD-10-CM | POA: Diagnosis not present

## 2021-06-21 DIAGNOSIS — M4714 Other spondylosis with myelopathy, thoracic region: Secondary | ICD-10-CM | POA: Diagnosis not present

## 2021-06-21 DIAGNOSIS — L89154 Pressure ulcer of sacral region, stage 4: Secondary | ICD-10-CM | POA: Diagnosis not present

## 2021-06-21 NOTE — Progress Notes (Addendum)
Patient Active Problem List   Diagnosis Date Noted   Cecum mass    Abnormal computed tomography angiography (CTA) of abdomen and pelvis    Pressure injury of skin 01/27/2021   GI bleed 01/26/2021   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   Chronic anticoagulation 05/26/2017   Urinary retention    Acute pain of right knee    Acute lower UTI    Weakness of right lower extremity    Confusion    Hydrocephalus (HCC)    Poor appetite    Acute blood loss anemia    Post-operative pain    Hypokalemia    Leukocytosis    Thrombocytopenia (HCC)    Acute deep vein thrombosis (DVT) of femoral vein of left lower extremity (HCC)    Myelopathy ([place]) 07/24/2016   Thoracic myelopathy    Depression    Benign essential HTN    History of subarachnoid hemorrhage    Hypercoagulable state ([place])    Constipation due to pain medication    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   Protein-calorie malnutrition ([place]) 12/21/2014   Thyroid activity decreased 12/21/2014   SAH (subarachnoid hemorrhage), 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   ANTISEPTIC ORAL RINSE (BIOTENE) LIQD    30 mLs by Mouth Rinse route 3 (three) times daily as needed for dry mouth.   ATORVASTATIN (LIPITOR) 10 MG TABLET    Take 10 mg by mouth at bedtime.   AZELASTINE (ASTELIN) 0.1 % NASAL SPRAY    Place 2 sprays into both nostrils 2 (two) times daily.   BACLOFEN (LIORESAL) 10 MG TABLET    Take 5-10 mg by mouth 3 (three) times daily as needed for muscle spasms. 32m daily, an additional 156min the afternoon as needed, and 87m587mn the evening as needed for muscle  spasms.   BISACODYL (DULCOLAX) 5 MG EC TABLET    Take 2 tablets (10 mg total) by mouth daily as needed for moderate constipation.   DEXTRAN 70-HYPROMELLOSE (ARTIFICIAL TEARS) 0.1-0.3 % SOLN    Place 2 drops into both eyes in the morning, at noon, and at bedtime.   DEXTRAN 70-HYPROMELLOSE (GENTEAL TEARS) 0.1-0.3 % SOLN    Place 2 drops into both eyes at bedtime.   FEEDING SUPPLEMENT (ENSURE ENLIVE / ENSURE PLUS) LIQD    Take 237 mLs by mouth 3 (three) times daily between meals.   FLUTICASONE (FLONASE) 50 MCG/ACT NASAL SPRAY    Place 1 spray into both nostrils daily as needed for allergies.    FOLIC ACID (FOLVITE) 1 MG TABLET    Take 1 mg by mouth daily.   GLYCERIN-HYPROMELLOSE-PEG 400 (ARTIFICIAL TEARS) 0.2-0.2-1 % SOLN    Place 1 drop into both eyes every 2 (two) hours as needed (dry eyes).   HYDROXYZINE (ATARAX/VISTARIL) 25 MG TABLET    Take 1 tablet (25 mg total) by mouth 3 (three) times daily.   LEVOTHYROXINE (SYNTHROID) 125 MCG TABLET    Take 125 mcg by mouth daily before breakfast.   MENTHOL, TOPICAL ANALGESIC, (BIOFREEZE) 4 % GEL    Apply 1 application topically  in the morning and at bedtime. For shoulder pain   METOPROLOL TARTRATE (LOPRESSOR) 25 MG TABLET    Take 1 tablet (25 mg total) by mouth 2 (two) times daily.   MULTIPLE VITAMIN (MULTIVITAMIN WITH MINERALS) TABS TABLET    Take 1 tablet by mouth daily.   OLOPATADINE HCL 0.2 % SOLN    Place 2 drops into both eyes daily.   PANTOPRAZOLE (PROTONIX) 40 MG TABLET    Take 1 tablet (40 mg total) by mouth 2 (two) times daily.   PAROXETINE (PAXIL) 10 MG TABLET    Take 10 mg by mouth daily.   POLYETHYLENE GLYCOL POWDER (GLYCOLAX/MIRALAX) 17 GM/SCOOP POWDER    Take 17 g by mouth daily as needed for mild constipation.   RIVAROXABAN (XARELTO) 20 MG TABS TABLET    Take 1 tablet (20 mg total) by mouth daily with supper.   SENNOSIDES-DOCUSATE SODIUM (SENOKOT-S) 8.6-50 MG TABLET    Take 2 tablets by mouth daily.   SIMETHICONE 180 MG CAPS    Take 180-360  mg by mouth in the morning, at noon, in the evening, and at bedtime. 164m three times daily continuously, and 3638madditionally as needed for flatulence.   SODIUM CHLORIDE (OCEAN) 0.65 % SOLN NASAL SPRAY    Place 2 sprays into both nostrils 3 (three) times daily as needed for congestion.  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 I have reviewed her recent discharge summary on 8/16 where her hospitalization was significant for Lower GI bleed 2/2 cecal mass which required blood transfusion. 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 She also tested positive for COVID 19 during the time of discharge. Also has pressure wound at the coccygeal region during the hospitalization   Patient says sacral wound has been there for at least 2 months. She says it developed during her recent hospitalization in August 2022.  Denies any drainage/pain or tenderness in the sacral area. Denies any fevers, chills and sweats. Denies nausea, vomitimg, abdominal pain and diarrhea. She is getting wound care in the facility. She says she was previously able to transfer herself from bed to chair but has been laying mostly in bed since the development of the sacral ulcer. She is currently on Levofloxacin( unclear start date)  Review of Systems: ROS 10 point ROS done with pertinent positives and negatives as above   Past Medical History:  Diagnosis Date   Arachnoid cyst    Brain aneurysm    Chronic  anticoagulation 05/26/2017   Depression    History of DVT (deep vein thrombosis)    [place]able [place]   Hypertension    Hypothyroidism    [facility]   Wheelchair bound    Past Surgical History:  Procedure Laterality Date   ABDOMINAL HYSTERECTOMY     patient denies   BIOPSY  01/31/2021   Procedure: BIOPSY;  Surgeon: PyJerene BearsMD;  [facility];  Service: Gastroenterology;;   BRAIN SURGERY     aneurysm  COLONOSCOPY WITH PROPOFOL N/A 01/31/2021   Procedure: COLONOSCOPY WITH PROPOFOL;  Surgeon: Jerene Bears, MD;  Location: [place];  Service: Gastroenterology;  Laterality: N/A;   ESOPHAGOGASTRODUODENOSCOPY (EGD) WITH PROPOFOL N/A 01/29/2021   Procedure: ESOPHAGOGASTRODUODENOSCOPY (EGD) WITH PROPOFOL;  Surgeon: Jerene Bears, MD;  Location[facility]COPY;  Service: Gastroenterology;  Laterality: N/A;   goiter     LAMINECTOMY N/A 07/19/2016   Procedure: LAMINECTOMY THORACIC FOUR THORACIC FIVE, THORACIC SIX TO THORACIC EIGHT,THORACIC TEN TO LUMBAR ONE, FENESTRATION OF ARACHNOID CYSTS;  Surgeon: Consuella Lose, MD;  Location: [place];  Service: Neurosurgery;  Laterality: N/A;   LAMINECTOMY N/A 06/03/2017   Procedure: THORACIC THREE - THORACIC FIVE LAMINECTOMY, FENESTRATION OF ARACHNOID CYST;  Surgeon: Consuella Lose, MD;  Location: [place];  Service: Neurosurgery;  Laterality: N/A;   LAPAROSCOPIC PARTIAL COLECTOMY N/A 02/01/2021   Procedure: LAPAROSCOPIC ASSISTED PARTIAL COLECTOMY;  Surgeon: Georganna Skeans, MD;  Location: [place];  Service: General;  Laterality: N/A;   THYROIDECTOMY     TUBAL LIGATION      Social History   Tobacco Use   Smoking status: Never   Smokeless tobacco: Never  Vaping Use   Vaping Use: Never used  Substance Use Topics   Alcohol use: No    Alcohol/week: 0.0 standard drinks   Drug use: No    No family history on file.  Allergies  Allergen Reactions   Other Other (See Comments)    Allergic to shellfish such  as shrimp    Shellfish-Derived Products     Health Maintenance  Topic Date Due   COVID-19 Vaccine (1) Never done   Hepatitis C Screening  Never done   TETANUS/TDAP  Never done   MAMMOGRAM  Never done   Zoster Vaccines- Shingrix (1 of 2) Never done   Pneumonia Vaccine 41+ Years old (1 - PCV) Never done   DEXA SCAN  Never done   INFLUENZA VACCINE  Never done   COLONOSCOPY (Pts 45-33yr Insurance coverage will need to be confirmed)  02/01/2031   HPV VACCINES  Aged Out    Objective: BP 131/83 (BP Location: Right Arm)    Pulse 90    Temp (!) 96.7 F (35.9 C) (Temporal)    SpO2 99%    Physical Exam Constitutional:  lying in the stretcher    Appearance: Normal appearance.  HENT:     Head: Normocephalic and atraumatic.      Mouth: Mucous membranes are moist.  Eyes:    Conjunctiva/sclera: Conjunctivae normal.     Pupils: Pupils are equal, round, and reactive to light.   Cardiovascular:     Rate and Rhythm: Normal rate and regular rhythm.     Heart sounds:   Pulmonary:     Effort: Pulmonary effort is normal.     Breath sounds: Normal breath sounds.   Abdominal:     General:     Palpations: Abdomen is soft. Non tender    Musculoskeletal:        General: not assessed   Skin:    General: Skin is warm and dry.     Comments:    Neurological:     General: Paraplegia     Mental Status: She is awake,  alert and oriented to person, place, and time.   Psychiatric:        Mood and Affect: Mood normal. Calm and cooperative  Lab Results Lab Results  Component Value Date   WBC 7.9 02/12/2021   HGB 10.3 (L) 02/12/2021  HCT 32.4 (L) 02/12/2021   MCV 85.3 02/12/2021   PLT 403 (H) 02/12/2021    Lab Results  Component Value Date   CREATININE 0.60 02/10/2021   BUN <5 (L) 02/10/2021   NA 138 02/10/2021   K 3.5 02/10/2021   CL 106 02/10/2021   CO2 24 02/10/2021    Lab Results  Component Value Date   ALT 9 01/26/2021   AST 13 (L) 01/26/2021   ALKPHOS 77 01/26/2021    BILITOT 0.3 01/26/2021    No results found for: CHOL, HDL, LDLCALC, LDLDIRECT, TRIG, CHOLHDL No results found for: LABRPR, RPRTITER No results found for: HIV1RNAQUANT, HIV1RNAVL, CD4TABS   Problem List Items Addressed This Visit       Nervous and Auditory   Thoracic myelopathy     Musculoskeletal and Integument   Sacral osteomyelitis ([place]) - Primary   Relevant Orders   CBC (Completed)   Comprehensive metabolic panel   C-reactive protein   Sedimentation rate     Other   Medication monitoring encounter   Pressure injury of sacral region, stage 4 (HCC)       Assessment/Plan 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 Per wound care notes, ulcer extends to bone.  11/15 Xray no findings s/o osteomyelitis   Arachnoid Cyst/SAH/Thoracic Myelopathy with ?Paraplegia Wheelchair>> recently bedbound  Will start Daptomycin 53m IV daily ( 7015mdaily),  Ceftriaxone 2g iv daily, Metronidazole 50031mO BID. [facility] JefDellis Filbertd gave recs regarding antibiotics and labs needed to monitor ( CK twice every week, CBC, CMP weekly and ESR and CRP twice weekly)  PICC. Per [facility], they will schedule PICC themselves  Fu in 3 weeks and possible transition to PO abtx at that time Continue wound care, pressure offloading ( air mattress) and adequate nutrition  I have personally spent more than 70 minutes involved in face-to-face and non-face-to-face activities for this patient on the day of the visit. Professional time spent includes the following activities: Preparing to see the patient (review of tests), Obtaining and/or reviewing separately obtained history (admission/discharge  record), Performing a medically appropriate examination and/or evaluation , Ordering medications/tests/procedures, referring and communicating with other health care professionals, Documenting clinical information in the EMR, Independently interpreting results (not separately reported), Communicating results to the patient/family/caregiver, Counseling and educating the patient/family/caregiver and Care coordination (not separately reported).   SabWilber OliphantD [facility] 06/21/2021, 6:22 AM

## 2021-06-22 ENCOUNTER — Telehealth: Payer: Self-pay

## 2021-06-22 LAB — COMPREHENSIVE METABOLIC PANEL
AG Ratio: 0.7 (calc) — ABNORMAL LOW (ref 1.0–2.5)
ALT: 7 U/L (ref 6–29)
AST: 12 U/L (ref 10–35)
Albumin: 2.4 g/dL — ABNORMAL LOW (ref 3.6–5.1)
Alkaline phosphatase (APISO): 75 U/L (ref 37–153)
BUN: 12 mg/dL (ref 7–25)
CO2: 26 mmol/L (ref 20–32)
Calcium: 7.6 mg/dL — ABNORMAL LOW (ref 8.6–10.4)
Chloride: 109 mmol/L (ref 98–110)
Creat: 0.56 mg/dL (ref 0.50–1.05)
Globulin: 3.6 g/dL (calc) (ref 1.9–3.7)
Glucose, Bld: 98 mg/dL (ref 65–99)
Potassium: 3.6 mmol/L (ref 3.5–5.3)
Sodium: 142 mmol/L (ref 135–146)
Total Bilirubin: 0.3 mg/dL (ref 0.2–1.2)
Total Protein: 6 g/dL — ABNORMAL LOW (ref 6.1–8.1)

## 2021-06-22 LAB — CBC
HCT: 31.4 % — ABNORMAL LOW (ref 35.0–45.0)
Hemoglobin: 10.1 g/dL — ABNORMAL LOW (ref 11.7–15.5)
MCH: 27.7 pg (ref 27.0–33.0)
MCHC: 32.2 g/dL (ref 32.0–36.0)
MCV: 86 fL (ref 80.0–100.0)
MPV: 10 fL (ref 7.5–12.5)
Platelets: 218 10*3/uL (ref 140–400)
RBC: 3.65 10*6/uL — ABNORMAL LOW (ref 3.80–5.10)
RDW: 16.3 % — ABNORMAL HIGH (ref 11.0–15.0)
WBC: 4.5 10*3/uL (ref 3.8–10.8)

## 2021-06-22 LAB — SEDIMENTATION RATE: Sed Rate: 56 mm/h — ABNORMAL HIGH (ref 0–30)

## 2021-06-22 LAB — C-REACTIVE PROTEIN: CRP: 32.2 mg/L — ABNORMAL HIGH (ref <8.0)

## 2021-06-22 NOTE — Telephone Encounter (Signed)
NP with [facility] called [phone]) called stating they do not have orders regarding the patient continuing the Levaquin with the IV antibiotics and metronidazole. Patient has been on the [place] since 06/07/21. They also did not receive a stop day, which I advised her that patient is suppose to follow up with our office in 3 weeks, so Dr. West Bali will determine then if patient needs to continue IV antibiotics.

## 2021-06-22 NOTE — Telephone Encounter (Signed)
Levaquin to be stopped  Just to confirm, these are the antibiotics we are starting   Start daptomycin 700mg  IV daily, ceftriaxone 2 g IV daily and Metronidazole 500mg  PO bid for 3 weeks. I had called rehab yesterday evening and spoke with the med-aid regarding antibiotics recs.  Fu in 3 weeks at which time will decide on further duration.

## 2021-06-22 NOTE — Telephone Encounter (Signed)
Thanks Diminique.

## 2021-06-22 NOTE — Telephone Encounter (Signed)
I spoke to Truman Hayward, NP with [facility] and verbal orders given for the patient to stop Levaquin. I also reviewed patient IV antibiotic regimen and oral metronidazole. I advised her further duration of IV antibiotics will be determined at patient's follow up appointment on 07/13/20

## 2021-07-02 DIAGNOSIS — M6281 Muscle weakness (generalized): Secondary | ICD-10-CM | POA: Diagnosis not present

## 2021-07-03 DIAGNOSIS — L89154 Pressure ulcer of sacral region, stage 4: Secondary | ICD-10-CM | POA: Diagnosis not present

## 2021-07-05 DIAGNOSIS — M6281 Muscle weakness (generalized): Secondary | ICD-10-CM | POA: Diagnosis not present

## 2021-07-09 DIAGNOSIS — E119 Type 2 diabetes mellitus without complications: Secondary | ICD-10-CM | POA: Diagnosis not present

## 2021-07-10 DIAGNOSIS — L89154 Pressure ulcer of sacral region, stage 4: Secondary | ICD-10-CM | POA: Diagnosis not present

## 2021-07-12 DIAGNOSIS — I10 Essential (primary) hypertension: Secondary | ICD-10-CM | POA: Diagnosis not present

## 2021-07-13 ENCOUNTER — Ambulatory Visit: Payer: Medicare PPO | Admitting: Infectious Diseases

## 2021-07-17 ENCOUNTER — Ambulatory Visit (INDEPENDENT_AMBULATORY_CARE_PROVIDER_SITE_OTHER): Payer: Medicare HMO | Admitting: Infectious Diseases

## 2021-07-17 ENCOUNTER — Other Ambulatory Visit: Payer: Self-pay

## 2021-07-17 ENCOUNTER — Encounter: Payer: Self-pay | Admitting: Infectious Diseases

## 2021-07-17 ENCOUNTER — Telehealth: Payer: Self-pay

## 2021-07-17 VITALS — BP 174/82 | HR 72 | Temp 98.2°F

## 2021-07-17 DIAGNOSIS — Z5181 Encounter for therapeutic drug level monitoring: Secondary | ICD-10-CM | POA: Diagnosis not present

## 2021-07-17 DIAGNOSIS — L89154 Pressure ulcer of sacral region, stage 4: Secondary | ICD-10-CM

## 2021-07-17 DIAGNOSIS — R531 Weakness: Secondary | ICD-10-CM | POA: Diagnosis not present

## 2021-07-17 DIAGNOSIS — Z743 Need for continuous supervision: Secondary | ICD-10-CM | POA: Diagnosis not present

## 2021-07-17 DIAGNOSIS — M4714 Other spondylosis with myelopathy, thoracic region: Secondary | ICD-10-CM | POA: Diagnosis not present

## 2021-07-17 DIAGNOSIS — M4628 Osteomyelitis of vertebra, sacral and sacrococcygeal region: Secondary | ICD-10-CM | POA: Diagnosis not present

## 2021-07-17 DIAGNOSIS — G8190 Hemiplegia, unspecified affecting unspecified side: Secondary | ICD-10-CM | POA: Diagnosis not present

## 2021-07-17 DIAGNOSIS — I10 Essential (primary) hypertension: Secondary | ICD-10-CM | POA: Diagnosis not present

## 2021-07-17 NOTE — Telephone Encounter (Signed)
Verbal orders given to RN at [facility] pull PICC line after last dose of antibiotics on 08/03/2021. Aware to add CBC and CMP to labs that are to be drawn tomorrow. Confirmed that they received all written orders from today's office visit.     [place], CMA

## 2021-07-17 NOTE — Progress Notes (Signed)
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   Pressure injury of sacral region, stage 4 ([place]) 06/21/2021   Cecum mass    Abnormal computed tomography angiography (CTA) of abdomen and pelvis    Pressure injury of skin 01/27/2021   GI bleed 01/26/2021   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   Chronic anticoagulation 05/26/2017   Urinary retention    Acute pain of right knee    Acute lower UTI    Weakness of right lower extremity    Confusion    Hydrocephalus (HCC)    Poor appetite    Acute blood loss anemia    Post-operative pain    Hypokalemia    Leukocytosis    Thrombocytopenia (HCC)    Acute deep vein thrombosis (DVT) of femoral vein of left lower extremity (HCC)    Myelopathy ([place]) 07/24/2016   Thoracic myelopathy    Depression    Benign essential HTN    History of subarachnoid hemorrhage    Hypercoagulable state ([place])    Constipation due to pain medication    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   ANTISEPTIC ORAL RINSE (BIOTENE) LIQD    30 mLs by Mouth Rinse route 3 (three) times daily as needed for dry mouth.   ATORVASTATIN (LIPITOR) 10 MG TABLET    Take 10 mg by mouth at bedtime.   AZELASTINE (ASTELIN) 0.1 % NASAL SPRAY    Place 2 sprays into both nostrils 2 (two) times daily.   BACLOFEN (LIORESAL) 10 MG TABLET    Take 5-10 mg by mouth 3  (three) times daily as needed for muscle spasms. 5mg  daily, an additional 10mg  in the afternoon as needed, and 5mg  in the evening as needed for muscle spasms.   BISACODYL (DULCOLAX) 5 MG EC TABLET    Take 2 tablets (10 mg total) by mouth daily as needed for moderate constipation.   DEXTRAN 70-HYPROMELLOSE (ARTIFICIAL TEARS) 0.1-0.3 % SOLN    Place 2 drops into both eyes in the morning, at noon, and at bedtime.   DEXTRAN 70-HYPROMELLOSE (GENTEAL TEARS) 0.1-0.3 % SOLN    Place 2 drops into both eyes at bedtime.   FEEDING SUPPLEMENT (ENSURE ENLIVE / ENSURE PLUS) LIQD    Take 237 mLs by mouth 3 (three) times daily between meals.   FLUTICASONE (FLONASE) 50 MCG/ACT NASAL SPRAY    Place 1 spray into both nostrils daily as needed for allergies.    FOLIC ACID (FOLVITE) 1 MG TABLET    Take 1 mg by mouth daily.   GLYCERIN-HYPROMELLOSE-PEG 400 (ARTIFICIAL TEARS) 0.2-0.2-1 % SOLN    Place 1 drop into both eyes every 2 (two) hours as needed (dry eyes).   HYDROXYZINE (ATARAX/VISTARIL) 25 MG TABLET    Take 1 tablet (25 mg total) by mouth 3 (three) times daily.   LEVOTHYROXINE (SYNTHROID) 125 MCG TABLET    Take  125 mcg by mouth daily before breakfast.   MENTHOL, TOPICAL ANALGESIC, (BIOFREEZE) 4 % GEL    Apply 1 application topically in the morning and at bedtime. For shoulder pain   METOPROLOL TARTRATE (LOPRESSOR) 25 MG TABLET    Take 1 tablet (25 mg total) by mouth 2 (two) times daily.   MULTIPLE VITAMIN (MULTIVITAMIN WITH MINERALS) TABS TABLET    Take 1 tablet by mouth daily.   OLOPATADINE HCL 0.2 % SOLN    Place 2 drops into both eyes daily.   PANTOPRAZOLE (PROTONIX) 40 MG TABLET    Take 1 tablet (40 mg total) by mouth 2 (two) times daily.   PAROXETINE (PAXIL) 10 MG TABLET    Take 10 mg by mouth daily.   POLYETHYLENE GLYCOL POWDER (GLYCOLAX/MIRALAX) 17 GM/SCOOP POWDER    Take 17 g by mouth daily as needed for mild constipation.   RIVAROXABAN (XARELTO) 20 MG TABS TABLET    Take 1 tablet (20 mg total) by mouth  daily with supper.   SENNOSIDES-DOCUSATE SODIUM (SENOKOT-S) 8.6-50 MG TABLET    Take 2 tablets by mouth daily.   SIMETHICONE 180 MG CAPS    Take 180-360 mg by mouth in the morning, at noon, in the evening, and at bedtime. 180mg  three times daily continuously, and 360mg  additionally as needed for flatulence.   SODIUM CHLORIDE (OCEAN) 0.65 % SOLN NASAL SPRAY    Place 2 sprays into both nostrils 3 (three) times daily as needed for congestion.  Modified Medications   No medications on file  Discontinued Medications   No medications on file    Subjective: Here for follow-up for decubitus sacral ulcer/sacral osteomyelitis.  Getting IV daptomycin and ceftriaxone through the PICC including p.o. metronidazole.  Denies any issues with the PICC line or IV antibiotics.  She is getting regular wound care at the facility and her wound has clean granulation tissue.  She would like to get PT and OT at the facility as well.  Denies any other complaints at this time.  Review of Systems: ROS denies fever, chills and sweats Denies nausea, vomiting and diarrhea All other systems reviewed and are negative  Past Medical History:  Diagnosis Date   Arachnoid cyst    Brain aneurysm    Chronic anticoagulation 05/26/2017   Depression    History of DVT (deep vein thrombosis)    Hypercoagulable state ([place])    Hypertension    Hypothyroidism    Stroke [facility]) 2016   [facility] 11/29/14   Wheelchair bound    Past Surgical History:  Procedure Laterality Date   ABDOMINAL HYSTERECTOMY     patient denies   BIOPSY  01/31/2021   Procedure: BIOPSY;  Surgeon: Jerene Bears, MD;  Location: [facility] ENDOSCOPY;  Service: Gastroenterology;;   BRAIN SURGERY     aneurysm   COLONOSCOPY WITH PROPOFOL N/A 01/31/2021   Procedure: COLONOSCOPY WITH PROPOFOL;  Surgeon: Jerene Bears, MD;  Location: [place];  Service: Gastroenterology;  Laterality: N/A;   ESOPHAGOGASTRODUODENOSCOPY (EGD) WITH PROPOFOL N/A 01/29/2021   Procedure:  ESOPHAGOGASTRODUODENOSCOPY (EGD) WITH PROPOFOL;  Surgeon: Jerene Bears, MD;  Location: [facility] ENDOSCOPY;  Service: Gastroenterology;  Laterality: N/A;   goiter     LAMINECTOMY N/A 07/19/2016   Procedure: LAMINECTOMY THORACIC FOUR THORACIC FIVE, THORACIC SIX TO THORACIC EIGHT,THORACIC TEN TO LUMBAR ONE, FENESTRATION OF ARACHNOID CYSTS;  Surgeon: Consuella Lose, MD;  Location: [place];  Service: Neurosurgery;  Laterality: N/A;   LAMINECTOMY N/A 06/03/2017   Procedure: THORACIC THREE -  THORACIC FIVE LAMINECTOMY, FENESTRATION OF ARACHNOID CYST;  Surgeon: Consuella Lose, MD;  Location: [place];  Service: Neurosurgery;  Laterality: N/A;   LAPAROSCOPIC PARTIAL COLECTOMY N/A 02/01/2021   Procedure: LAPAROSCOPIC ASSISTED PARTIAL COLECTOMY;  Surgeon: Georganna Skeans, MD;  Location: [facility];  Service: General;  Laterality: N/A;   THYROIDECTOMY     TUBAL LIGATION       Social History   Tobacco Use   Smoking status: Never   Smokeless tobacco: Never  Vaping Use   Vaping Use: Never used  Substance Use Topics   Alcohol use: No    Alcohol/week: 0.0 standard drinks   Drug use: No    No family history on file.  Allergies  Allergen Reactions   Other Other (See Comments)    Allergic to shellfish such as shrimp    Shellfish-Derived Products     Health Maintenance  Topic Date Due   COVID-19 Vaccine (1) Never done   Hepatitis C Screening  Never done   TETANUS/TDAP  Never done   MAMMOGRAM  Never done   Zoster Vaccines- Shingrix (1 of 2) Never done   Pneumonia Vaccine 10+ Years old (1 - PCV) Never done   DEXA SCAN  Never done   INFLUENZA VACCINE  Never done   COLONOSCOPY (Pts 45-58yrs Insurance coverage will need to be confirmed)  02/01/2031   HPV VACCINES  Aged Out    Objective: BP (!) 174/82    Pulse 72    Temp 98.2 F (36.8 C) (Oral)    SpO2 97%    Physical Exam Constitutional:      Appearance: Normal appearance.  Lying in the stretcher HENT:     Head: Normocephalic and atraumatic.       Mouth: Mucous membranes are moist.  Eyes:    Conjunctiva/sclera: Conjunctivae normal.     Pupils: Pupils are equal, round  Cardiovascular:     Rate and Rhythm: Normal rate and regular rhythm.     Heart sounds:   Pulmonary:     Effort: Pulmonary effort is normal.     Breath sounds:   Abdominal:     General:     Palpations: Abdomen is soft.   Musculoskeletal:        General: Not examined  Skin:    General: Skin is warm and dry.     Comments: Sacral wound with clean granulation tissue, no surrounding cellulitis or discharge in the wound bed    Neurological:     General:     Mental Status: She is alert and oriented to person, place, and time.   Psychiatric:        Mood and Affect: Mood normal.   Lab Results Lab Results  Component Value Date   WBC 4.5 06/21/2021   HGB 10.1 (L) 06/21/2021   HCT 31.4 (L) 06/21/2021   MCV 86.0 06/21/2021   PLT 218 06/21/2021    Lab Results  Component Value Date   CREATININE 0.56 06/21/2021   BUN 12 06/21/2021   NA 142 06/21/2021   K 3.6 06/21/2021   CL 109 06/21/2021   CO2 26 06/21/2021    Lab Results  Component Value Date   ALT 7 06/21/2021   AST 12 06/21/2021   ALKPHOS 77 01/26/2021   BILITOT 0.3 06/21/2021    No results found for: CHOL, HDL, LDLCALC, LDLDIRECT, TRIG, CHOLHDL No results found for: LABRPR, RPRTITER No results found for: HIV1RNAQUANT, HIV1RNAVL, CD4TABS  Problem List Items Addressed This Visit  Nervous and Auditory   Thoracic myelopathy     Musculoskeletal and Integument   Sacral osteomyelitis (HCC) - Primary   Relevant Medications   metroNIDAZOLE (FLAGYL) 500 MG tablet     Other   Medication monitoring encounter   Pressure injury of sacral region, stage 4 (HCC)   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 Per wound care notes, ulcer extends to bone.  11/15 Xray no findings s/o osteomyelitis    Arachnoid Cyst/SAH/Thoracic Myelopathy with ?Paraplegia Wheelchair>> recently bedbound  Seems IV daptomycin was started on 12/24, ceftriaxone on 12/23 and Metronidazole on 12/23 per notes from facility Continue IV daptomycin and IV ceftriaxone as is for remainder of 6 weeks treatment course.  End date 08/03/2021 DC metronidazole, has received more than 3 weeks course Request labs from her facility Follow-up with wound care She would also like to get physical therapy and Occupational Therapy in her facilit Follow-up in 2 to 3 weeks  I have personally spent more than 70 minutes involved in face-to-face and non-face-to-face activities for this patient on the day of the visit. Professional time spent includes the following activities: Preparing to see the patient (review of tests), Obtaining and/or reviewing separately obtained history (admission/discharge record), Performing a medically appropriate examination and/or evaluation , Ordering medications/tests/procedures, referring and communicating with other health care professionals, Documenting clinical information in the EMR, Independently interpreting results (not separately reported), Communicating results to the patient/family/caregiver, Counseling and educating the patient/family/caregiver and Care coordination (not separately reported).   Wilber Oliphant, [place] [facility] 07/17/2021, 9:29 AM

## 2021-07-17 NOTE — Telephone Encounter (Signed)
Thank you for the heads up Tiffany!

## 2021-07-18 DIAGNOSIS — I10 Essential (primary) hypertension: Secondary | ICD-10-CM | POA: Diagnosis not present

## 2021-07-18 DIAGNOSIS — E119 Type 2 diabetes mellitus without complications: Secondary | ICD-10-CM | POA: Diagnosis not present

## 2021-07-23 DIAGNOSIS — I10 Essential (primary) hypertension: Secondary | ICD-10-CM | POA: Diagnosis not present

## 2021-07-24 DIAGNOSIS — L89154 Pressure ulcer of sacral region, stage 4: Secondary | ICD-10-CM | POA: Diagnosis not present

## 2021-07-26 DIAGNOSIS — I10 Essential (primary) hypertension: Secondary | ICD-10-CM | POA: Diagnosis not present

## 2021-07-31 DIAGNOSIS — L89154 Pressure ulcer of sacral region, stage 4: Secondary | ICD-10-CM | POA: Diagnosis not present

## 2021-08-02 DIAGNOSIS — I10 Essential (primary) hypertension: Secondary | ICD-10-CM | POA: Diagnosis not present

## 2021-08-03 DIAGNOSIS — L89154 Pressure ulcer of sacral region, stage 4: Secondary | ICD-10-CM | POA: Diagnosis not present

## 2021-08-06 DIAGNOSIS — I10 Essential (primary) hypertension: Secondary | ICD-10-CM | POA: Diagnosis not present

## 2021-08-07 DIAGNOSIS — L89154 Pressure ulcer of sacral region, stage 4: Secondary | ICD-10-CM | POA: Diagnosis not present

## 2021-08-08 DIAGNOSIS — M6281 Muscle weakness (generalized): Secondary | ICD-10-CM | POA: Diagnosis not present

## 2021-08-09 DIAGNOSIS — D649 Anemia, unspecified: Secondary | ICD-10-CM | POA: Diagnosis not present

## 2021-08-09 DIAGNOSIS — M6281 Muscle weakness (generalized): Secondary | ICD-10-CM | POA: Diagnosis not present

## 2021-08-09 DIAGNOSIS — R1310 Dysphagia, unspecified: Secondary | ICD-10-CM | POA: Diagnosis not present

## 2021-08-10 ENCOUNTER — Ambulatory Visit: Payer: Medicare HMO | Admitting: Infectious Diseases

## 2021-08-10 DIAGNOSIS — L89154 Pressure ulcer of sacral region, stage 4: Secondary | ICD-10-CM | POA: Diagnosis not present

## 2021-08-10 DIAGNOSIS — J309 Allergic rhinitis, unspecified: Secondary | ICD-10-CM | POA: Diagnosis not present

## 2021-08-10 DIAGNOSIS — L304 Erythema intertrigo: Secondary | ICD-10-CM | POA: Diagnosis not present

## 2021-08-10 DIAGNOSIS — M6281 Muscle weakness (generalized): Secondary | ICD-10-CM | POA: Diagnosis not present

## 2021-08-10 DIAGNOSIS — M4628 Osteomyelitis of vertebra, sacral and sacrococcygeal region: Secondary | ICD-10-CM | POA: Diagnosis not present

## 2021-08-10 DIAGNOSIS — L509 Urticaria, unspecified: Secondary | ICD-10-CM | POA: Diagnosis not present

## 2021-08-13 DIAGNOSIS — L304 Erythema intertrigo: Secondary | ICD-10-CM | POA: Diagnosis not present

## 2021-08-13 DIAGNOSIS — M6281 Muscle weakness (generalized): Secondary | ICD-10-CM | POA: Diagnosis not present

## 2021-08-13 DIAGNOSIS — L509 Urticaria, unspecified: Secondary | ICD-10-CM | POA: Diagnosis not present

## 2021-08-13 DIAGNOSIS — I10 Essential (primary) hypertension: Secondary | ICD-10-CM | POA: Diagnosis not present

## 2021-08-14 DIAGNOSIS — M6281 Muscle weakness (generalized): Secondary | ICD-10-CM | POA: Diagnosis not present

## 2021-08-14 DIAGNOSIS — L89154 Pressure ulcer of sacral region, stage 4: Secondary | ICD-10-CM | POA: Diagnosis not present

## 2021-08-14 DIAGNOSIS — D519 Vitamin B12 deficiency anemia, unspecified: Secondary | ICD-10-CM | POA: Diagnosis not present

## 2021-08-14 DIAGNOSIS — E119 Type 2 diabetes mellitus without complications: Secondary | ICD-10-CM | POA: Diagnosis not present

## 2021-08-15 DIAGNOSIS — M6281 Muscle weakness (generalized): Secondary | ICD-10-CM | POA: Diagnosis not present

## 2021-08-15 DIAGNOSIS — L509 Urticaria, unspecified: Secondary | ICD-10-CM | POA: Diagnosis not present

## 2021-08-16 DIAGNOSIS — I10 Essential (primary) hypertension: Secondary | ICD-10-CM | POA: Diagnosis not present

## 2021-08-16 DIAGNOSIS — D649 Anemia, unspecified: Secondary | ICD-10-CM | POA: Diagnosis not present

## 2021-08-20 DIAGNOSIS — E039 Hypothyroidism, unspecified: Secondary | ICD-10-CM | POA: Diagnosis not present

## 2021-08-20 DIAGNOSIS — D649 Anemia, unspecified: Secondary | ICD-10-CM | POA: Diagnosis not present

## 2021-08-20 DIAGNOSIS — L89154 Pressure ulcer of sacral region, stage 4: Secondary | ICD-10-CM | POA: Diagnosis not present

## 2021-08-21 DIAGNOSIS — L89154 Pressure ulcer of sacral region, stage 4: Secondary | ICD-10-CM | POA: Diagnosis not present

## 2021-08-27 DIAGNOSIS — F331 Major depressive disorder, recurrent, moderate: Secondary | ICD-10-CM | POA: Diagnosis not present

## 2021-08-27 DIAGNOSIS — F411 Generalized anxiety disorder: Secondary | ICD-10-CM | POA: Diagnosis not present

## 2021-08-28 DIAGNOSIS — L89154 Pressure ulcer of sacral region, stage 4: Secondary | ICD-10-CM | POA: Diagnosis not present

## 2021-08-31 DIAGNOSIS — L89154 Pressure ulcer of sacral region, stage 4: Secondary | ICD-10-CM | POA: Diagnosis not present

## 2021-09-03 DIAGNOSIS — D649 Anemia, unspecified: Secondary | ICD-10-CM | POA: Diagnosis not present

## 2021-09-03 DIAGNOSIS — E039 Hypothyroidism, unspecified: Secondary | ICD-10-CM | POA: Diagnosis not present

## 2021-09-04 DIAGNOSIS — L89154 Pressure ulcer of sacral region, stage 4: Secondary | ICD-10-CM | POA: Diagnosis not present

## 2021-09-04 DIAGNOSIS — L988 Other specified disorders of the skin and subcutaneous tissue: Secondary | ICD-10-CM | POA: Diagnosis not present

## 2021-09-06 DIAGNOSIS — D649 Anemia, unspecified: Secondary | ICD-10-CM | POA: Diagnosis not present

## 2021-09-06 DIAGNOSIS — E039 Hypothyroidism, unspecified: Secondary | ICD-10-CM | POA: Diagnosis not present

## 2021-09-10 DIAGNOSIS — D649 Anemia, unspecified: Secondary | ICD-10-CM | POA: Diagnosis not present

## 2021-09-10 DIAGNOSIS — E039 Hypothyroidism, unspecified: Secondary | ICD-10-CM | POA: Diagnosis not present

## 2021-09-11 ENCOUNTER — Other Ambulatory Visit: Payer: Self-pay

## 2021-09-11 ENCOUNTER — Telehealth: Payer: Self-pay

## 2021-09-11 ENCOUNTER — Ambulatory Visit (INDEPENDENT_AMBULATORY_CARE_PROVIDER_SITE_OTHER): Payer: Medicare HMO | Admitting: Infectious Diseases

## 2021-09-11 ENCOUNTER — Encounter: Payer: Self-pay | Admitting: Infectious Diseases

## 2021-09-11 VITALS — BP 151/98 | HR 60 | Temp 97.1°F | Ht 65.0 in | Wt 170.0 lb

## 2021-09-11 DIAGNOSIS — R531 Weakness: Secondary | ICD-10-CM | POA: Diagnosis not present

## 2021-09-11 DIAGNOSIS — Z5181 Encounter for therapeutic drug level monitoring: Secondary | ICD-10-CM | POA: Diagnosis not present

## 2021-09-11 DIAGNOSIS — Z7401 Bed confinement status: Secondary | ICD-10-CM | POA: Diagnosis not present

## 2021-09-11 DIAGNOSIS — L988 Other specified disorders of the skin and subcutaneous tissue: Secondary | ICD-10-CM | POA: Diagnosis not present

## 2021-09-11 DIAGNOSIS — L89154 Pressure ulcer of sacral region, stage 4: Secondary | ICD-10-CM | POA: Diagnosis not present

## 2021-09-11 DIAGNOSIS — M4628 Osteomyelitis of vertebra, sacral and sacrococcygeal region: Secondary | ICD-10-CM

## 2021-09-11 NOTE — Telephone Encounter (Signed)
Per Dr. West Bali, called [facility] [phone]) to request fax of last wound care note (from 09/04/21) from facility. Wound care note received and given to Dr. West Bali. ? ?Binnie Kand, RN  ?

## 2021-09-11 NOTE — Progress Notes (Signed)
? ? ?Patient Active Problem List  ? Diagnosis Date Noted  ? Medication monitoring encounter 06/21/2021  ? 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 Pressure injury of sacral region, stage 4 ([place]) 06/21/2021  ? Cecum mass   ? Abnormal computed tomography angiography (CTA) of abdomen and pelvis   ? Pressure injury of skin 01/27/2021  ? GI bleed 01/26/2021  ? 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  ? Chronic anticoagulation 05/26/2017  ? Urinary retention   ? Acute pain of right knee   ? Acute lower UTI   ? Weakness of right lower extremity   ? Confusion   ? Hydrocephalus ([place])   ? Poor appetite   ? Acute blood loss anemia   ? Post-operative pain   ? Hypokalemia   ? Leukocytosis   ? Thrombocytopenia ([place])   ? Acute deep vein thrombosis (DVT) of femoral vein of left lower extremity ([place])   ? Myelopathy ([place]) 07/24/2016  ? Thoracic myelopathy   ? Depression   ? Benign essential HTN   ? History of subarachnoid hemorrhage   ? Hypercoagulable state ([place])   ? Constipation due to pain medication   ? 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  ? Protein-calorie malnutrition ([place]) 12/21/2014  ? Thyroid activity decreased 12/21/2014  ? 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 Essential hypertension 12/13/2014  ? Anemia, iron deficiency 12/13/2014  ? Hypothyroidism 12/13/2014  ? Prediabetes 12/13/2014  ? ? ?Patient's Medications  ?New Prescriptions  ? No medications on file  ?Previous Medications  ? ACETAMINOPHEN (TYLENOL) 325 MG TABLET    Take 2 tablets (650 mg total) by mouth every 6 (six) hours as needed for mild pain, fever or headache.  ? ANTISEPTIC ORAL RINSE (BIOTENE) LIQD    30 mLs by Mouth Rinse route 3 (three) times daily as needed for dry mouth.  ? ATORVASTATIN (LIPITOR) 10 MG TABLET    Take 10 mg by mouth at bedtime.  ? AZELASTINE (ASTELIN) 0.1 % NASAL SPRAY    Place 2 sprays into both nostrils 2 (two) times daily.  ? BACLOFEN (LIORESAL) 10 MG TABLET    Take 5-10 mg by mouth 3 (three)  times daily as needed for muscle spasms. '5mg'$  daily, an additional '10mg'$  in the afternoon as needed, and '5mg'$  in the evening as needed for muscle spasms.  ? BISACODYL (DULCOLAX) 5 MG EC TABLET    Take 2 tablets (10 mg total) by mouth daily as needed for moderate constipation.  ? DEXTRAN 70-HYPROMELLOSE (ARTIFICIAL TEARS) 0.1-0.3 % SOLN    Place 2 drops into both eyes in the morning, at noon, and at bedtime.  ? DEXTRAN 70-HYPROMELLOSE (GENTEAL TEARS) 0.1-0.3 % SOLN    Place 2 drops into both eyes at bedtime.  ? FEEDING SUPPLEMENT (ENSURE ENLIVE / ENSURE PLUS) LIQD    Take 237 mLs by mouth 3 (three) times daily between meals.  ? FLUTICASONE (FLONASE) 50 MCG/ACT NASAL SPRAY    Place 1 spray into both nostrils daily as needed for allergies.   ? FOLIC ACID (FOLVITE) 1 MG TABLET    Take 1 mg by mouth daily.  ? GABAPENTIN (NEURONTIN) 300 MG CAPSULE    Take by mouth.  ? GLYCERIN-HYPROMELLOSE-PEG 400 (ARTIFICIAL TEARS) 0.2-0.2-1 % SOLN    Place 1 drop into both eyes every 2 (two) hours as needed (dry eyes).  ? HYDROXYZINE (ATARAX/VISTARIL) 25 MG TABLET    Take 1 tablet (25 mg total) by mouth 3 (three) times daily.  ? LEVOTHYROXINE (SYNTHROID)  125 MCG TABLET    Take 125 mcg by mouth daily before breakfast.  ? MENTHOL, TOPICAL ANALGESIC, (BIOFREEZE) 4 % GEL    Apply 1 application topically in the morning and at bedtime. For shoulder pain  ? METOPROLOL TARTRATE (LOPRESSOR) 25 MG TABLET    Take 1 tablet (25 mg total) by mouth 2 (two) times daily.  ? METRONIDAZOLE (FLAGYL) 500 MG TABLET    Take 500 mg by mouth 2 (two) times daily.  ? MULTIPLE VITAMIN (MULTIVITAMIN WITH MINERALS) TABS TABLET    Take 1 tablet by mouth daily.  ? OLOPATADINE HCL 0.2 % SOLN    Place 2 drops into both eyes daily.  ? PANTOPRAZOLE (PROTONIX) 40 MG TABLET    Take 1 tablet (40 mg total) by mouth 2 (two) times daily.  ? PAROXETINE (PAXIL) 10 MG TABLET    Take 10 mg by mouth daily.  ? POLYETHYLENE GLYCOL POWDER (GLYCOLAX/MIRALAX) 17 GM/SCOOP POWDER    Take 17 g  by mouth daily as needed for mild constipation.  ? RIVAROXABAN (XARELTO) 20 MG TABS TABLET    Take 1 tablet (20 mg total) by mouth daily with supper.  ? SENNOSIDES-DOCUSATE SODIUM (SENOKOT-S) 8.6-50 MG TABLET    Take 2 tablets by mouth daily.  ? SIMETHICONE 180 MG CAPS    Take 180-360 mg by mouth in the morning, at noon, in the evening, and at bedtime. '180mg'$  three times daily continuously, and '360mg'$  additionally as needed for flatulence.  ? SODIUM CHLORIDE (OCEAN) 0.65 % SOLN NASAL SPRAY    Place 2 sprays into both nostrils 3 (three) times daily as needed for congestion.  ?Modified Medications  ? No medications on file  ?Discontinued Medications  ? No medications on file  ? ? ?Subjective: ?Here for follow-up for decubitus sacral ulcer/sacral osteomyelitis.  Has completed IV course of daptomycin and ceftriaxone as instructed. PICC line has been removed. She is getting regular wound care from facility and bandage was changed this morning - wound was told to be looking healthy and non infected. Denies fevers, chills. Denies nausea, vomiting and diarrhea. No concerns otherwise.  ? ?Review of Systems: ?ROS  ?All other systems reviewed and are negative ? ?Past Medical History:  ?Diagnosis Date  ? Arachnoid cyst   ? Brain aneurysm   ? Chronic anticoagulation 05/26/2017  ? Depression   ? History of DVT (deep vein thrombosis)   ? Hypercoagulable state ([place])   ? Hypertension   ? Hypothyroidism   ? Stroke [facility]) 2016  ? [facility] 11/29/14  ? Wheelchair bound   ? ?Past Surgical History:  ?Procedure Laterality Date  ? ABDOMINAL HYSTERECTOMY    ? patient denies  ? BIOPSY  01/31/2021  ? Procedure: BIOPSY;  Surgeon: Jerene Bears, MD;  Location: [place];  Service: Gastroenterology;;  ? BRAIN SURGERY    ? aneurysm  ? COLONOSCOPY WITH PROPOFOL N/A 01/31/2021  ? Procedure: COLONOSCOPY WITH PROPOFOL;  Surgeon: Jerene Bears, MD;  Location: [place];  Service: Gastroenterology;  Laterality: N/A;  ? ESOPHAGOGASTRODUODENOSCOPY (EGD) WITH  PROPOFOL N/A 01/29/2021  ? Procedure: ESOPHAGOGASTRODUODENOSCOPY (EGD) WITH PROPOFOL;  Surgeon: Jerene Bears, MD;  Location: [facility] ENDOSCOPY;  Service: Gastroenterology;  Laterality: N/A;  ? goiter    ? LAMINECTOMY N/A 07/19/2016  ? Procedure: LAMINECTOMY THORACIC FOUR THORACIC FIVE, THORACIC SIX TO THORACIC EIGHT,THORACIC TEN TO LUMBAR ONE, FENESTRATION OF ARACHNOID CYSTS;  Surgeon: Consuella Lose, MD;  Location: [place];  Service: Neurosurgery;  Laterality: N/A;  ? LAMINECTOMY  N/A 06/03/2017  ? Procedure: THORACIC THREE - THORACIC FIVE LAMINECTOMY, FENESTRATION OF ARACHNOID CYST;  Surgeon: Consuella Lose, MD;  Location: [place];  Service: Neurosurgery;  Laterality: N/A;  ? LAPAROSCOPIC PARTIAL COLECTOMY N/A 02/01/2021  ? Procedure: LAPAROSCOPIC ASSISTED PARTIAL COLECTOMY;  Surgeon: Georganna Skeans, MD;  Location: [place];  Service: General;  Laterality: N/A;  ? THYROIDECTOMY    ? TUBAL LIGATION    ? ? ? ?Social History  ? ?Tobacco Use  ? Smoking status: Never  ? Smokeless tobacco: Never  ?Vaping Use  ? Vaping Use: Never used  ?Substance Use Topics  ? Alcohol use: No  ?  Alcohol/week: 0.0 standard drinks  ? Drug use: No  ? ? ?No family history on file. ? ?Allergies  ?Allergen Reactions  ? Other Other (See Comments)  ?  Allergic to shellfish such as shrimp ?  ? Shellfish-Derived Products   ? ? ?Health Maintenance  ?Topic Date Due  ? COVID-19 Vaccine (1) Never done  ? URINE MICROALBUMIN  Never done  ? Hepatitis C Screening  Never done  ? TETANUS/TDAP  Never done  ? MAMMOGRAM  Never done  ? Zoster Vaccines- Shingrix (1 of 2) Never done  ? Pneumonia Vaccine 79+ Years old (1 - PCV) Never done  ? DEXA SCAN  Never done  ? INFLUENZA VACCINE  Never done  ? COLONOSCOPY (Pts 45-53yr Insurance coverage will need to be confirmed)  02/01/2031  ? HPV VACCINES  Aged Out  ? ? ?Objective: ?BP (!) 151/98 Comment: Per facility documents 127 61  Pulse 60 Comment: radial pulse  Temp (!) 97.1 ?F (36.2 ?C) (Oral)   Ht '5\' 5"'$  (1.651 m)   Wt  170 lb (77.1 kg) Comment: stated, as of 09/08/21  BMI 28.29 kg/m?  ? ?Physical Exam ?Constitutional:   ?   Appearance: Normal appearance.  Lying in the stretcher ?HENT:  ?   Head: Normocephalic and atraumatic.

## 2021-09-17 DIAGNOSIS — D649 Anemia, unspecified: Secondary | ICD-10-CM | POA: Diagnosis not present

## 2021-09-18 DIAGNOSIS — L988 Other specified disorders of the skin and subcutaneous tissue: Secondary | ICD-10-CM | POA: Diagnosis not present

## 2021-09-18 DIAGNOSIS — L89154 Pressure ulcer of sacral region, stage 4: Secondary | ICD-10-CM | POA: Diagnosis not present

## 2021-09-20 DIAGNOSIS — E119 Type 2 diabetes mellitus without complications: Secondary | ICD-10-CM | POA: Diagnosis not present

## 2021-09-20 DIAGNOSIS — D519 Vitamin B12 deficiency anemia, unspecified: Secondary | ICD-10-CM | POA: Diagnosis not present

## 2021-09-20 DIAGNOSIS — E039 Hypothyroidism, unspecified: Secondary | ICD-10-CM | POA: Diagnosis not present

## 2021-09-20 DIAGNOSIS — D649 Anemia, unspecified: Secondary | ICD-10-CM | POA: Diagnosis not present

## 2021-09-24 DIAGNOSIS — E119 Type 2 diabetes mellitus without complications: Secondary | ICD-10-CM | POA: Diagnosis not present

## 2021-09-25 DIAGNOSIS — L988 Other specified disorders of the skin and subcutaneous tissue: Secondary | ICD-10-CM | POA: Diagnosis not present

## 2021-09-25 DIAGNOSIS — Z9049 Acquired absence of other specified parts of digestive tract: Secondary | ICD-10-CM | POA: Diagnosis not present

## 2021-09-25 DIAGNOSIS — E119 Type 2 diabetes mellitus without complications: Secondary | ICD-10-CM | POA: Diagnosis not present

## 2021-09-25 DIAGNOSIS — E039 Hypothyroidism, unspecified: Secondary | ICD-10-CM | POA: Diagnosis not present

## 2021-09-25 DIAGNOSIS — M4628 Osteomyelitis of vertebra, sacral and sacrococcygeal region: Secondary | ICD-10-CM | POA: Diagnosis not present

## 2021-09-25 DIAGNOSIS — E785 Hyperlipidemia, unspecified: Secondary | ICD-10-CM | POA: Diagnosis not present

## 2021-09-25 DIAGNOSIS — Z86718 Personal history of other venous thrombosis and embolism: Secondary | ICD-10-CM | POA: Diagnosis not present

## 2021-09-25 DIAGNOSIS — G5793 Unspecified mononeuropathy of bilateral lower limbs: Secondary | ICD-10-CM | POA: Diagnosis not present

## 2021-09-25 DIAGNOSIS — L89154 Pressure ulcer of sacral region, stage 4: Secondary | ICD-10-CM | POA: Diagnosis not present

## 2021-09-27 DIAGNOSIS — L89154 Pressure ulcer of sacral region, stage 4: Secondary | ICD-10-CM | POA: Diagnosis not present

## 2021-10-02 DIAGNOSIS — L89154 Pressure ulcer of sacral region, stage 4: Secondary | ICD-10-CM | POA: Diagnosis not present

## 2021-10-03 DIAGNOSIS — I10 Essential (primary) hypertension: Secondary | ICD-10-CM | POA: Diagnosis not present

## 2021-10-03 DIAGNOSIS — E119 Type 2 diabetes mellitus without complications: Secondary | ICD-10-CM | POA: Diagnosis not present

## 2021-10-03 DIAGNOSIS — D649 Anemia, unspecified: Secondary | ICD-10-CM | POA: Diagnosis not present

## 2021-10-03 DIAGNOSIS — E039 Hypothyroidism, unspecified: Secondary | ICD-10-CM | POA: Diagnosis not present

## 2021-10-03 DIAGNOSIS — E785 Hyperlipidemia, unspecified: Secondary | ICD-10-CM | POA: Diagnosis not present

## 2021-10-04 DIAGNOSIS — I10 Essential (primary) hypertension: Secondary | ICD-10-CM | POA: Diagnosis not present

## 2021-10-04 DIAGNOSIS — M6281 Muscle weakness (generalized): Secondary | ICD-10-CM | POA: Diagnosis not present

## 2021-10-04 DIAGNOSIS — M4714 Other spondylosis with myelopathy, thoracic region: Secondary | ICD-10-CM | POA: Diagnosis not present

## 2021-10-04 DIAGNOSIS — E119 Type 2 diabetes mellitus without complications: Secondary | ICD-10-CM | POA: Diagnosis not present

## 2021-10-16 DIAGNOSIS — L89154 Pressure ulcer of sacral region, stage 4: Secondary | ICD-10-CM | POA: Diagnosis not present

## 2021-10-23 DIAGNOSIS — L89154 Pressure ulcer of sacral region, stage 4: Secondary | ICD-10-CM | POA: Diagnosis not present

## 2021-10-23 DIAGNOSIS — L988 Other specified disorders of the skin and subcutaneous tissue: Secondary | ICD-10-CM | POA: Diagnosis not present

## 2021-10-24 DIAGNOSIS — M6281 Muscle weakness (generalized): Secondary | ICD-10-CM | POA: Diagnosis not present

## 2021-10-24 DIAGNOSIS — R278 Other lack of coordination: Secondary | ICD-10-CM | POA: Diagnosis not present

## 2021-10-24 DIAGNOSIS — S71102D Unspecified open wound, left thigh, subsequent encounter: Secondary | ICD-10-CM | POA: Diagnosis not present

## 2021-10-24 DIAGNOSIS — L89154 Pressure ulcer of sacral region, stage 4: Secondary | ICD-10-CM | POA: Diagnosis not present

## 2021-10-24 DIAGNOSIS — G8194 Hemiplegia, unspecified affecting left nondominant side: Secondary | ICD-10-CM | POA: Diagnosis not present

## 2021-10-26 DIAGNOSIS — G8194 Hemiplegia, unspecified affecting left nondominant side: Secondary | ICD-10-CM | POA: Diagnosis not present

## 2021-10-26 DIAGNOSIS — R278 Other lack of coordination: Secondary | ICD-10-CM | POA: Diagnosis not present

## 2021-10-26 DIAGNOSIS — M6281 Muscle weakness (generalized): Secondary | ICD-10-CM | POA: Diagnosis not present

## 2021-10-28 DIAGNOSIS — M6281 Muscle weakness (generalized): Secondary | ICD-10-CM | POA: Diagnosis not present

## 2021-10-28 DIAGNOSIS — R278 Other lack of coordination: Secondary | ICD-10-CM | POA: Diagnosis not present

## 2021-10-28 DIAGNOSIS — G8194 Hemiplegia, unspecified affecting left nondominant side: Secondary | ICD-10-CM | POA: Diagnosis not present

## 2021-10-29 DIAGNOSIS — M6281 Muscle weakness (generalized): Secondary | ICD-10-CM | POA: Diagnosis not present

## 2021-10-29 DIAGNOSIS — G8194 Hemiplegia, unspecified affecting left nondominant side: Secondary | ICD-10-CM | POA: Diagnosis not present

## 2021-10-29 DIAGNOSIS — R278 Other lack of coordination: Secondary | ICD-10-CM | POA: Diagnosis not present

## 2021-10-30 DIAGNOSIS — M6281 Muscle weakness (generalized): Secondary | ICD-10-CM | POA: Diagnosis not present

## 2021-10-30 DIAGNOSIS — G8194 Hemiplegia, unspecified affecting left nondominant side: Secondary | ICD-10-CM | POA: Diagnosis not present

## 2021-10-30 DIAGNOSIS — R278 Other lack of coordination: Secondary | ICD-10-CM | POA: Diagnosis not present

## 2021-10-30 DIAGNOSIS — L988 Other specified disorders of the skin and subcutaneous tissue: Secondary | ICD-10-CM | POA: Diagnosis not present

## 2021-10-30 DIAGNOSIS — L89154 Pressure ulcer of sacral region, stage 4: Secondary | ICD-10-CM | POA: Diagnosis not present

## 2021-11-01 DIAGNOSIS — M6281 Muscle weakness (generalized): Secondary | ICD-10-CM | POA: Diagnosis not present

## 2021-11-01 DIAGNOSIS — R278 Other lack of coordination: Secondary | ICD-10-CM | POA: Diagnosis not present

## 2021-11-01 DIAGNOSIS — G8194 Hemiplegia, unspecified affecting left nondominant side: Secondary | ICD-10-CM | POA: Diagnosis not present

## 2021-11-02 DIAGNOSIS — G8194 Hemiplegia, unspecified affecting left nondominant side: Secondary | ICD-10-CM | POA: Diagnosis not present

## 2021-11-02 DIAGNOSIS — R278 Other lack of coordination: Secondary | ICD-10-CM | POA: Diagnosis not present

## 2021-11-02 DIAGNOSIS — M6281 Muscle weakness (generalized): Secondary | ICD-10-CM | POA: Diagnosis not present

## 2021-11-03 DIAGNOSIS — G8194 Hemiplegia, unspecified affecting left nondominant side: Secondary | ICD-10-CM | POA: Diagnosis not present

## 2021-11-03 DIAGNOSIS — M6281 Muscle weakness (generalized): Secondary | ICD-10-CM | POA: Diagnosis not present

## 2021-11-03 DIAGNOSIS — R278 Other lack of coordination: Secondary | ICD-10-CM | POA: Diagnosis not present

## 2021-11-05 DIAGNOSIS — R278 Other lack of coordination: Secondary | ICD-10-CM | POA: Diagnosis not present

## 2021-11-05 DIAGNOSIS — M6281 Muscle weakness (generalized): Secondary | ICD-10-CM | POA: Diagnosis not present

## 2021-11-05 DIAGNOSIS — G8194 Hemiplegia, unspecified affecting left nondominant side: Secondary | ICD-10-CM | POA: Diagnosis not present

## 2021-11-06 DIAGNOSIS — T7840XA Allergy, unspecified, initial encounter: Secondary | ICD-10-CM | POA: Diagnosis not present

## 2021-11-06 DIAGNOSIS — R32 Unspecified urinary incontinence: Secondary | ICD-10-CM | POA: Diagnosis not present

## 2021-11-06 DIAGNOSIS — G8194 Hemiplegia, unspecified affecting left nondominant side: Secondary | ICD-10-CM | POA: Diagnosis not present

## 2021-11-06 DIAGNOSIS — K592 Neurogenic bowel, not elsewhere classified: Secondary | ICD-10-CM | POA: Diagnosis not present

## 2021-11-06 DIAGNOSIS — D649 Anemia, unspecified: Secondary | ICD-10-CM | POA: Diagnosis not present

## 2021-11-06 DIAGNOSIS — M6281 Muscle weakness (generalized): Secondary | ICD-10-CM | POA: Diagnosis not present

## 2021-11-06 DIAGNOSIS — E039 Hypothyroidism, unspecified: Secondary | ICD-10-CM | POA: Diagnosis not present

## 2021-11-06 DIAGNOSIS — R278 Other lack of coordination: Secondary | ICD-10-CM | POA: Diagnosis not present

## 2021-11-06 DIAGNOSIS — K449 Diaphragmatic hernia without obstruction or gangrene: Secondary | ICD-10-CM | POA: Diagnosis not present

## 2021-11-06 DIAGNOSIS — F39 Unspecified mood [affective] disorder: Secondary | ICD-10-CM | POA: Diagnosis not present

## 2021-11-06 DIAGNOSIS — L988 Other specified disorders of the skin and subcutaneous tissue: Secondary | ICD-10-CM | POA: Diagnosis not present

## 2021-11-06 DIAGNOSIS — G8220 Paraplegia, unspecified: Secondary | ICD-10-CM | POA: Diagnosis not present

## 2021-11-06 DIAGNOSIS — D6859 Other primary thrombophilia: Secondary | ICD-10-CM | POA: Diagnosis not present

## 2021-11-06 DIAGNOSIS — L89154 Pressure ulcer of sacral region, stage 4: Secondary | ICD-10-CM | POA: Diagnosis not present

## 2021-11-07 DIAGNOSIS — M6281 Muscle weakness (generalized): Secondary | ICD-10-CM | POA: Diagnosis not present

## 2021-11-07 DIAGNOSIS — R278 Other lack of coordination: Secondary | ICD-10-CM | POA: Diagnosis not present

## 2021-11-07 DIAGNOSIS — G8194 Hemiplegia, unspecified affecting left nondominant side: Secondary | ICD-10-CM | POA: Diagnosis not present

## 2021-11-08 DIAGNOSIS — R278 Other lack of coordination: Secondary | ICD-10-CM | POA: Diagnosis not present

## 2021-11-08 DIAGNOSIS — G8194 Hemiplegia, unspecified affecting left nondominant side: Secondary | ICD-10-CM | POA: Diagnosis not present

## 2021-11-08 DIAGNOSIS — M6281 Muscle weakness (generalized): Secondary | ICD-10-CM | POA: Diagnosis not present

## 2021-11-09 DIAGNOSIS — M6281 Muscle weakness (generalized): Secondary | ICD-10-CM | POA: Diagnosis not present

## 2021-11-09 DIAGNOSIS — G8194 Hemiplegia, unspecified affecting left nondominant side: Secondary | ICD-10-CM | POA: Diagnosis not present

## 2021-11-09 DIAGNOSIS — R278 Other lack of coordination: Secondary | ICD-10-CM | POA: Diagnosis not present

## 2021-11-12 DIAGNOSIS — M6281 Muscle weakness (generalized): Secondary | ICD-10-CM | POA: Diagnosis not present

## 2021-11-12 DIAGNOSIS — R278 Other lack of coordination: Secondary | ICD-10-CM | POA: Diagnosis not present

## 2021-11-12 DIAGNOSIS — G8194 Hemiplegia, unspecified affecting left nondominant side: Secondary | ICD-10-CM | POA: Diagnosis not present

## 2021-11-12 DIAGNOSIS — E119 Type 2 diabetes mellitus without complications: Secondary | ICD-10-CM | POA: Diagnosis not present

## 2021-11-12 DIAGNOSIS — L509 Urticaria, unspecified: Secondary | ICD-10-CM | POA: Diagnosis not present

## 2021-11-12 DIAGNOSIS — L299 Pruritus, unspecified: Secondary | ICD-10-CM | POA: Diagnosis not present

## 2021-11-12 DIAGNOSIS — H04129 Dry eye syndrome of unspecified lacrimal gland: Secondary | ICD-10-CM | POA: Diagnosis not present

## 2021-11-13 DIAGNOSIS — M6281 Muscle weakness (generalized): Secondary | ICD-10-CM | POA: Diagnosis not present

## 2021-11-13 DIAGNOSIS — R278 Other lack of coordination: Secondary | ICD-10-CM | POA: Diagnosis not present

## 2021-11-13 DIAGNOSIS — L89154 Pressure ulcer of sacral region, stage 4: Secondary | ICD-10-CM | POA: Diagnosis not present

## 2021-11-13 DIAGNOSIS — L986 Other infiltrative disorders of the skin and subcutaneous tissue: Secondary | ICD-10-CM | POA: Diagnosis not present

## 2021-11-13 DIAGNOSIS — T8189XA Other complications of procedures, not elsewhere classified, initial encounter: Secondary | ICD-10-CM | POA: Diagnosis not present

## 2021-11-13 DIAGNOSIS — L988 Other specified disorders of the skin and subcutaneous tissue: Secondary | ICD-10-CM | POA: Diagnosis not present

## 2021-11-13 DIAGNOSIS — G8194 Hemiplegia, unspecified affecting left nondominant side: Secondary | ICD-10-CM | POA: Diagnosis not present

## 2021-11-14 DIAGNOSIS — M6281 Muscle weakness (generalized): Secondary | ICD-10-CM | POA: Diagnosis not present

## 2021-11-14 DIAGNOSIS — R278 Other lack of coordination: Secondary | ICD-10-CM | POA: Diagnosis not present

## 2021-11-14 DIAGNOSIS — G8194 Hemiplegia, unspecified affecting left nondominant side: Secondary | ICD-10-CM | POA: Diagnosis not present

## 2021-11-15 DIAGNOSIS — M6281 Muscle weakness (generalized): Secondary | ICD-10-CM | POA: Diagnosis not present

## 2021-11-15 DIAGNOSIS — R278 Other lack of coordination: Secondary | ICD-10-CM | POA: Diagnosis not present

## 2021-11-15 DIAGNOSIS — G8194 Hemiplegia, unspecified affecting left nondominant side: Secondary | ICD-10-CM | POA: Diagnosis not present

## 2021-11-16 DIAGNOSIS — G8194 Hemiplegia, unspecified affecting left nondominant side: Secondary | ICD-10-CM | POA: Diagnosis not present

## 2021-11-16 DIAGNOSIS — M6281 Muscle weakness (generalized): Secondary | ICD-10-CM | POA: Diagnosis not present

## 2021-11-16 DIAGNOSIS — R278 Other lack of coordination: Secondary | ICD-10-CM | POA: Diagnosis not present

## 2021-11-20 DIAGNOSIS — T8189XA Other complications of procedures, not elsewhere classified, initial encounter: Secondary | ICD-10-CM | POA: Diagnosis not present

## 2021-11-20 DIAGNOSIS — L89154 Pressure ulcer of sacral region, stage 4: Secondary | ICD-10-CM | POA: Diagnosis not present

## 2021-11-20 DIAGNOSIS — L988 Other specified disorders of the skin and subcutaneous tissue: Secondary | ICD-10-CM | POA: Diagnosis not present

## 2021-11-22 DIAGNOSIS — T8189XD Other complications of procedures, not elsewhere classified, subsequent encounter: Secondary | ICD-10-CM | POA: Diagnosis not present

## 2021-11-22 DIAGNOSIS — S71102D Unspecified open wound, left thigh, subsequent encounter: Secondary | ICD-10-CM | POA: Diagnosis not present

## 2021-11-22 DIAGNOSIS — L89154 Pressure ulcer of sacral region, stage 4: Secondary | ICD-10-CM | POA: Diagnosis not present

## 2021-11-27 DIAGNOSIS — L89154 Pressure ulcer of sacral region, stage 4: Secondary | ICD-10-CM | POA: Diagnosis not present

## 2021-11-27 DIAGNOSIS — T8189XA Other complications of procedures, not elsewhere classified, initial encounter: Secondary | ICD-10-CM | POA: Diagnosis not present

## 2021-11-27 DIAGNOSIS — L988 Other specified disorders of the skin and subcutaneous tissue: Secondary | ICD-10-CM | POA: Diagnosis not present

## 2021-12-04 DIAGNOSIS — L603 Nail dystrophy: Secondary | ICD-10-CM | POA: Diagnosis not present

## 2021-12-04 DIAGNOSIS — L89154 Pressure ulcer of sacral region, stage 4: Secondary | ICD-10-CM | POA: Diagnosis not present

## 2021-12-04 DIAGNOSIS — T8189XA Other complications of procedures, not elsewhere classified, initial encounter: Secondary | ICD-10-CM | POA: Diagnosis not present

## 2021-12-04 DIAGNOSIS — L988 Other specified disorders of the skin and subcutaneous tissue: Secondary | ICD-10-CM | POA: Diagnosis not present

## 2021-12-04 DIAGNOSIS — I739 Peripheral vascular disease, unspecified: Secondary | ICD-10-CM | POA: Diagnosis not present

## 2021-12-11 DIAGNOSIS — T8189XA Other complications of procedures, not elsewhere classified, initial encounter: Secondary | ICD-10-CM | POA: Diagnosis not present

## 2021-12-11 DIAGNOSIS — L89154 Pressure ulcer of sacral region, stage 4: Secondary | ICD-10-CM | POA: Diagnosis not present

## 2021-12-11 DIAGNOSIS — L988 Other specified disorders of the skin and subcutaneous tissue: Secondary | ICD-10-CM | POA: Diagnosis not present

## 2021-12-18 DIAGNOSIS — R112 Nausea with vomiting, unspecified: Secondary | ICD-10-CM | POA: Diagnosis not present

## 2021-12-18 DIAGNOSIS — Z9049 Acquired absence of other specified parts of digestive tract: Secondary | ICD-10-CM | POA: Diagnosis not present

## 2021-12-18 DIAGNOSIS — K5900 Constipation, unspecified: Secondary | ICD-10-CM | POA: Diagnosis not present

## 2021-12-19 DIAGNOSIS — R112 Nausea with vomiting, unspecified: Secondary | ICD-10-CM | POA: Diagnosis not present

## 2021-12-19 DIAGNOSIS — K592 Neurogenic bowel, not elsewhere classified: Secondary | ICD-10-CM | POA: Diagnosis not present

## 2021-12-19 DIAGNOSIS — Z9049 Acquired absence of other specified parts of digestive tract: Secondary | ICD-10-CM | POA: Diagnosis not present

## 2021-12-19 DIAGNOSIS — K5900 Constipation, unspecified: Secondary | ICD-10-CM | POA: Diagnosis not present

## 2021-12-19 DIAGNOSIS — K56609 Unspecified intestinal obstruction, unspecified as to partial versus complete obstruction: Secondary | ICD-10-CM | POA: Diagnosis not present

## 2021-12-19 DIAGNOSIS — I10 Essential (primary) hypertension: Secondary | ICD-10-CM | POA: Diagnosis not present

## 2021-12-20 ENCOUNTER — Inpatient Hospital Stay (HOSPITAL_COMMUNITY)
Admission: EM | Admit: 2021-12-20 | Discharge: 2022-01-10 | DRG: 335 | Disposition: A | Discharge status: Skilled Nursing Facility | Payer: Medicare Other | Source: Skilled Nursing Facility | Attending: Internal Medicine | Admitting: Internal Medicine

## 2021-12-20 ENCOUNTER — Emergency Department (HOSPITAL_COMMUNITY): Payer: Medicare Other

## 2021-12-20 ENCOUNTER — Encounter (HOSPITAL_COMMUNITY): Payer: Self-pay

## 2021-12-20 ENCOUNTER — Inpatient Hospital Stay (HOSPITAL_COMMUNITY): Payer: Medicare Other

## 2021-12-20 ENCOUNTER — Other Ambulatory Visit: Payer: Self-pay

## 2021-12-20 DIAGNOSIS — Z9049 Acquired absence of other specified parts of digestive tract: Secondary | ICD-10-CM

## 2021-12-20 DIAGNOSIS — I429 Cardiomyopathy, unspecified: Secondary | ICD-10-CM | POA: Diagnosis present

## 2021-12-20 DIAGNOSIS — K449 Diaphragmatic hernia without obstruction or gangrene: Secondary | ICD-10-CM | POA: Diagnosis not present

## 2021-12-20 DIAGNOSIS — Z20822 Contact with and (suspected) exposure to covid-19: Secondary | ICD-10-CM | POA: Diagnosis not present

## 2021-12-20 DIAGNOSIS — K5939 Other megacolon: Secondary | ICD-10-CM | POA: Diagnosis not present

## 2021-12-20 DIAGNOSIS — Z7901 Long term (current) use of anticoagulants: Secondary | ICD-10-CM | POA: Diagnosis not present

## 2021-12-20 DIAGNOSIS — N179 Acute kidney failure, unspecified: Secondary | ICD-10-CM | POA: Diagnosis present

## 2021-12-20 DIAGNOSIS — E785 Hyperlipidemia, unspecified: Secondary | ICD-10-CM | POA: Diagnosis present

## 2021-12-20 DIAGNOSIS — E871 Hypo-osmolality and hyponatremia: Secondary | ICD-10-CM | POA: Diagnosis not present

## 2021-12-20 DIAGNOSIS — K219 Gastro-esophageal reflux disease without esophagitis: Secondary | ICD-10-CM | POA: Diagnosis not present

## 2021-12-20 DIAGNOSIS — N3944 Nocturnal enuresis: Secondary | ICD-10-CM | POA: Diagnosis present

## 2021-12-20 DIAGNOSIS — I959 Hypotension, unspecified: Secondary | ICD-10-CM | POA: Diagnosis not present

## 2021-12-20 DIAGNOSIS — Z79890 Hormone replacement therapy (postmenopausal): Secondary | ICD-10-CM

## 2021-12-20 DIAGNOSIS — E875 Hyperkalemia: Secondary | ICD-10-CM | POA: Diagnosis not present

## 2021-12-20 DIAGNOSIS — R079 Chest pain, unspecified: Secondary | ICD-10-CM | POA: Diagnosis not present

## 2021-12-20 DIAGNOSIS — K56609 Unspecified intestinal obstruction, unspecified as to partial versus complete obstruction: Secondary | ICD-10-CM

## 2021-12-20 DIAGNOSIS — E039 Hypothyroidism, unspecified: Secondary | ICD-10-CM

## 2021-12-20 DIAGNOSIS — I10 Essential (primary) hypertension: Secondary | ICD-10-CM

## 2021-12-20 DIAGNOSIS — T8189XD Other complications of procedures, not elsewhere classified, subsequent encounter: Secondary | ICD-10-CM | POA: Diagnosis not present

## 2021-12-20 DIAGNOSIS — E878 Other disorders of electrolyte and fluid balance, not elsewhere classified: Secondary | ICD-10-CM | POA: Diagnosis present

## 2021-12-20 DIAGNOSIS — K440 Diaphragmatic hernia with obstruction, without gangrene: Secondary | ICD-10-CM | POA: Diagnosis not present

## 2021-12-20 DIAGNOSIS — L89153 Pressure ulcer of sacral region, stage 3: Secondary | ICD-10-CM | POA: Diagnosis present

## 2021-12-20 DIAGNOSIS — Z7401 Bed confinement status: Secondary | ICD-10-CM | POA: Diagnosis not present

## 2021-12-20 DIAGNOSIS — I110 Hypertensive heart disease with heart failure: Secondary | ICD-10-CM | POA: Diagnosis not present

## 2021-12-20 DIAGNOSIS — M7989 Other specified soft tissue disorders: Secondary | ICD-10-CM | POA: Diagnosis not present

## 2021-12-20 DIAGNOSIS — D6859 Other primary thrombophilia: Secondary | ICD-10-CM | POA: Diagnosis not present

## 2021-12-20 DIAGNOSIS — E870 Hyperosmolality and hypernatremia: Secondary | ICD-10-CM | POA: Diagnosis not present

## 2021-12-20 DIAGNOSIS — R339 Retention of urine, unspecified: Secondary | ICD-10-CM | POA: Diagnosis present

## 2021-12-20 DIAGNOSIS — I69351 Hemiplegia and hemiparesis following cerebral infarction affecting right dominant side: Secondary | ICD-10-CM

## 2021-12-20 DIAGNOSIS — D638 Anemia in other chronic diseases classified elsewhere: Secondary | ICD-10-CM | POA: Diagnosis present

## 2021-12-20 DIAGNOSIS — M4628 Osteomyelitis of vertebra, sacral and sacrococcygeal region: Secondary | ICD-10-CM | POA: Diagnosis present

## 2021-12-20 DIAGNOSIS — K668 Other specified disorders of peritoneum: Secondary | ICD-10-CM | POA: Diagnosis not present

## 2021-12-20 DIAGNOSIS — R404 Transient alteration of awareness: Secondary | ICD-10-CM | POA: Diagnosis not present

## 2021-12-20 DIAGNOSIS — K5669 Other partial intestinal obstruction: Secondary | ICD-10-CM | POA: Diagnosis not present

## 2021-12-20 DIAGNOSIS — R5381 Other malaise: Secondary | ICD-10-CM | POA: Diagnosis present

## 2021-12-20 DIAGNOSIS — I609 Nontraumatic subarachnoid hemorrhage, unspecified: Secondary | ICD-10-CM

## 2021-12-20 DIAGNOSIS — Z90710 Acquired absence of both cervix and uterus: Secondary | ICD-10-CM

## 2021-12-20 DIAGNOSIS — Z91013 Allergy to seafood: Secondary | ICD-10-CM

## 2021-12-20 DIAGNOSIS — K6389 Other specified diseases of intestine: Secondary | ICD-10-CM

## 2021-12-20 DIAGNOSIS — K5651 Intestinal adhesions [bands], with partial obstruction: Secondary | ICD-10-CM | POA: Diagnosis not present

## 2021-12-20 DIAGNOSIS — I509 Heart failure, unspecified: Secondary | ICD-10-CM | POA: Diagnosis not present

## 2021-12-20 DIAGNOSIS — Z6829 Body mass index (BMI) 29.0-29.9, adult: Secondary | ICD-10-CM

## 2021-12-20 DIAGNOSIS — E876 Hypokalemia: Secondary | ICD-10-CM | POA: Diagnosis not present

## 2021-12-20 DIAGNOSIS — I441 Atrioventricular block, second degree: Secondary | ICD-10-CM | POA: Diagnosis not present

## 2021-12-20 DIAGNOSIS — E890 Postprocedural hypothyroidism: Secondary | ICD-10-CM | POA: Diagnosis present

## 2021-12-20 DIAGNOSIS — I82511 Chronic embolism and thrombosis of right femoral vein: Secondary | ICD-10-CM | POA: Diagnosis not present

## 2021-12-20 DIAGNOSIS — D509 Iron deficiency anemia, unspecified: Secondary | ICD-10-CM

## 2021-12-20 DIAGNOSIS — R1084 Generalized abdominal pain: Secondary | ICD-10-CM | POA: Diagnosis not present

## 2021-12-20 DIAGNOSIS — S71102D Unspecified open wound, left thigh, subsequent encounter: Secondary | ICD-10-CM | POA: Diagnosis not present

## 2021-12-20 DIAGNOSIS — L89154 Pressure ulcer of sacral region, stage 4: Secondary | ICD-10-CM | POA: Diagnosis present

## 2021-12-20 DIAGNOSIS — D72829 Elevated white blood cell count, unspecified: Secondary | ICD-10-CM | POA: Diagnosis not present

## 2021-12-20 DIAGNOSIS — I428 Other cardiomyopathies: Secondary | ICD-10-CM | POA: Diagnosis not present

## 2021-12-20 DIAGNOSIS — R1110 Vomiting, unspecified: Secondary | ICD-10-CM | POA: Diagnosis not present

## 2021-12-20 DIAGNOSIS — Z79899 Other long term (current) drug therapy: Secondary | ICD-10-CM

## 2021-12-20 DIAGNOSIS — J9811 Atelectasis: Secondary | ICD-10-CM | POA: Diagnosis not present

## 2021-12-20 DIAGNOSIS — K567 Ileus, unspecified: Secondary | ICD-10-CM | POA: Diagnosis present

## 2021-12-20 DIAGNOSIS — K565 Intestinal adhesions [bands], unspecified as to partial versus complete obstruction: Secondary | ICD-10-CM | POA: Diagnosis not present

## 2021-12-20 DIAGNOSIS — I5041 Acute combined systolic (congestive) and diastolic (congestive) heart failure: Secondary | ICD-10-CM | POA: Diagnosis present

## 2021-12-20 DIAGNOSIS — R9431 Abnormal electrocardiogram [ECG] [EKG]: Secondary | ICD-10-CM | POA: Diagnosis not present

## 2021-12-20 DIAGNOSIS — R109 Unspecified abdominal pain: Secondary | ICD-10-CM | POA: Diagnosis not present

## 2021-12-20 DIAGNOSIS — R410 Disorientation, unspecified: Secondary | ICD-10-CM | POA: Diagnosis not present

## 2021-12-20 DIAGNOSIS — R509 Fever, unspecified: Secondary | ICD-10-CM | POA: Diagnosis not present

## 2021-12-20 DIAGNOSIS — Z4682 Encounter for fitting and adjustment of non-vascular catheter: Secondary | ICD-10-CM | POA: Diagnosis not present

## 2021-12-20 DIAGNOSIS — Z8679 Personal history of other diseases of the circulatory system: Secondary | ICD-10-CM

## 2021-12-20 DIAGNOSIS — Z993 Dependence on wheelchair: Secondary | ICD-10-CM

## 2021-12-20 DIAGNOSIS — Z743 Need for continuous supervision: Secondary | ICD-10-CM | POA: Diagnosis not present

## 2021-12-20 LAB — RESP PANEL BY RT-PCR (FLU A&B, COVID) ARPGX2
Influenza A by PCR: NEGATIVE
Influenza B by PCR: NEGATIVE
SARS Coronavirus 2 by RT PCR: NEGATIVE

## 2021-12-20 LAB — CBC WITH DIFFERENTIAL/PLATELET
Abs Immature Granulocytes: 0.01 10*3/uL (ref 0.00–0.07)
Basophils Absolute: 0 10*3/uL (ref 0.0–0.1)
Basophils Relative: 0 %
Eosinophils Absolute: 0.1 10*3/uL (ref 0.0–0.5)
Eosinophils Relative: 1 %
HCT: 35.2 % — ABNORMAL LOW (ref 36.0–46.0)
Hemoglobin: 11.3 g/dL — ABNORMAL LOW (ref 12.0–15.0)
Immature Granulocytes: 0 %
Lymphocytes Relative: 16 %
Lymphs Abs: 0.8 10*3/uL (ref 0.7–4.0)
MCH: 26.4 pg (ref 26.0–34.0)
MCHC: 32.1 g/dL (ref 30.0–36.0)
MCV: 82.2 fL (ref 80.0–100.0)
Monocytes Absolute: 0.6 10*3/uL (ref 0.1–1.0)
Monocytes Relative: 11 %
Neutro Abs: 3.7 10*3/uL (ref 1.7–7.7)
Neutrophils Relative %: 72 %
Platelets: 270 10*3/uL (ref 150–400)
RBC: 4.28 MIL/uL (ref 3.87–5.11)
RDW: 19 % — ABNORMAL HIGH (ref 11.5–15.5)
WBC: 5.2 10*3/uL (ref 4.0–10.5)
nRBC: 0 % (ref 0.0–0.2)

## 2021-12-20 LAB — COMPREHENSIVE METABOLIC PANEL
ALT: 11 U/L (ref 0–44)
AST: 15 U/L (ref 15–41)
Albumin: 3.1 g/dL — ABNORMAL LOW (ref 3.5–5.0)
Alkaline Phosphatase: 88 U/L (ref 38–126)
Anion gap: 9 (ref 5–15)
BUN: 29 mg/dL — ABNORMAL HIGH (ref 8–23)
CO2: 24 mmol/L (ref 22–32)
Calcium: 8.1 mg/dL — ABNORMAL LOW (ref 8.9–10.3)
Chloride: 105 mmol/L (ref 98–111)
Creatinine, Ser: 0.85 mg/dL (ref 0.44–1.00)
GFR, Estimated: >60 mL/min (ref >60)
Glucose, Bld: 103 mg/dL — ABNORMAL HIGH (ref 70–99)
Potassium: 3.6 mmol/L (ref 3.5–5.1)
Sodium: 138 mmol/L (ref 135–145)
Total Bilirubin: 0.6 mg/dL (ref 0.3–1.2)
Total Protein: 8.5 g/dL — ABNORMAL HIGH (ref 6.5–8.1)

## 2021-12-20 LAB — LACTIC ACID, PLASMA
Lactic Acid, Venous: 1.2 mmol/L (ref 0.5–1.9)
Lactic Acid, Venous: 1.5 mmol/L (ref 0.5–1.9)

## 2021-12-20 LAB — LIPASE, BLOOD: Lipase: 44 U/L (ref 11–51)

## 2021-12-20 MED ORDER — LEVOTHYROXINE SODIUM 100 MCG/5ML IV SOLN: 75.0000 ug | Freq: Every day | INTRAVENOUS | Status: DC

## 2021-12-20 MED ORDER — ONDANSETRON HCL 4 MG/2ML IJ SOLN
4.0000 mg | Freq: Once | INTRAMUSCULAR | Status: AC
  Administered 2021-12-20: 4 mg via INTRAVENOUS
  Filled 2021-12-20: qty 2

## 2021-12-20 MED ORDER — IOHEXOL 300 MG/ML  SOLN
100.0000 mL | Freq: Once | INTRAMUSCULAR | Status: AC | PRN
  Administered 2021-12-20: 100 mL via INTRAVENOUS

## 2021-12-20 MED ORDER — ACETAMINOPHEN 650 MG RE SUPP: 650.0000 mg | Freq: Four times a day (QID) | RECTAL | Status: DC | PRN

## 2021-12-20 MED ORDER — ONDANSETRON HCL 4 MG/2ML IJ SOLN
4.0000 mg | Freq: Four times a day (QID) | INTRAMUSCULAR | Status: DC | PRN
  Administered 2021-12-20 – 2021-12-24 (×2): 4 mg via INTRAVENOUS
  Filled 2021-12-20 (×2): qty 2

## 2021-12-20 MED ORDER — MORPHINE SULFATE (PF) 2 MG/ML IV SOLN
2.0000 mg | INTRAVENOUS | Status: DC | PRN
  Administered 2021-12-20 – 2021-12-28 (×13): 2 mg via INTRAVENOUS
  Filled 2021-12-20 (×13): qty 1

## 2021-12-20 MED ORDER — SODIUM CHLORIDE 0.9 % IV SOLN: INTRAVENOUS | Status: DC

## 2021-12-20 MED ORDER — SODIUM CHLORIDE (PF) 0.9 % IJ SOLN
INTRAMUSCULAR | Status: AC
  Filled 2021-12-20: qty 50

## 2021-12-20 MED ORDER — PANTOPRAZOLE SODIUM 40 MG IV SOLR
40.0000 mg | Freq: Two times a day (BID) | INTRAVENOUS | Status: DC
  Administered 2021-12-20 – 2022-01-04 (×29): 40 mg via INTRAVENOUS
  Filled 2021-12-20 (×28): qty 10

## 2021-12-20 MED ORDER — ALBUTEROL SULFATE (2.5 MG/3ML) 0.083% IN NEBU: 2.5000 mg | INHALATION_SOLUTION | RESPIRATORY_TRACT | Status: DC | PRN

## 2021-12-20 MED ORDER — HYDRALAZINE HCL 20 MG/ML IJ SOLN
10.0000 mg | Freq: Three times a day (TID) | INTRAMUSCULAR | Status: DC | PRN
  Administered 2021-12-28: 10 mg via INTRAVENOUS
  Filled 2021-12-20: qty 1

## 2021-12-20 MED ORDER — ENOXAPARIN SODIUM 40 MG/0.4ML IJ SOSY
40.0000 mg | PREFILLED_SYRINGE | INTRAMUSCULAR | Status: DC
  Administered 2021-12-20 – 2021-12-25 (×6): 40 mg via SUBCUTANEOUS
  Filled 2021-12-20 (×6): qty 0.4

## 2021-12-20 MED ORDER — LEVOTHYROXINE SODIUM 100 MCG/5ML IV SOLN
100.0000 ug | Freq: Every day | INTRAVENOUS | Status: DC
  Administered 2021-12-22 – 2022-01-04 (×13): 100 ug via INTRAVENOUS
  Filled 2021-12-20 (×14): qty 5

## 2021-12-20 MED ORDER — SODIUM CHLORIDE 0.9 % IV BOLUS
1000.0000 mL | Freq: Once | INTRAVENOUS | Status: AC
  Administered 2021-12-20: 1000 mL via INTRAVENOUS

## 2021-12-20 MED ORDER — HYDROCODONE-ACETAMINOPHEN 5-325 MG PO TABS
1.0000 | ORAL_TABLET | ORAL | Status: DC | PRN
  Administered 2021-12-27: 2 via ORAL
  Filled 2021-12-20: qty 2

## 2021-12-20 MED ORDER — ONDANSETRON HCL 4 MG PO TABS: 4.0000 mg | ORAL_TABLET | Freq: Four times a day (QID) | ORAL | Status: DC | PRN

## 2021-12-20 MED ORDER — ACETAMINOPHEN 325 MG PO TABS: 650.0000 mg | ORAL_TABLET | Freq: Four times a day (QID) | ORAL | Status: DC | PRN

## 2021-12-20 MED ORDER — DIATRIZOATE MEGLUMINE & SODIUM 66-10 % PO SOLN: 90.0000 mL | Freq: Once | ORAL | Status: DC

## 2021-12-20 MED ORDER — POLYETHYLENE GLYCOL 3350 17 G PO PACK: 17.0000 g | PACK | Freq: Every day | ORAL | Status: DC | PRN

## 2021-12-20 NOTE — ED Triage Notes (Signed)
EMS reports from [facility], abdominal pain. SBO last August. States last [facility] Sunday. Abdomen distended per EMS  BP 136/86 HR 102 RR 14 Sp02 95 RA  20 RAC (From Facility)

## 2021-12-20 NOTE — ED Notes (Signed)
Pt repositioned

## 2021-12-20 NOTE — H&P (Signed)
History and Physical    Patient: Katrina Donaldson ZHG:[id number] DOB: 1956/01/31 DOA: 12/20/2021 DOS: the patient was seen and examined on 12/20/2021 PCP: Donald Prose, MD  Patient coming from: SNF  Chief Complaint:  Chief Complaint  Patient presents with   Abdominal Pain   HPI:  Katrina Donaldson is a 66 y.o. 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  Patient reports generalized abdominal pain described as cramping which is intermittent, with nausea, vomiting recently ingested food/medications, has been unable to keep anything down only water with abdominal distention.  Patient denies any bloody vomiting, chest pain, cough, fever/chills, shortness of breath, dysuria.  Patient last BM was about 5 days ago.  Due to the symptoms, patient was brought into the ED.  In the ED, vital signs noted for tachycardia otherwise fairly stable, labs noted for mild anemia, otherwise stable.  CT abdomen/pelvis showed high-grade SBO likely due to adhesions, large hiatal hernia.  EDP consulted general surgery, requested small bowel protocol.  [facility] hospitalist admitted patient for further management.    Review of Systems: As mentioned in the history of present illness. All other systems reviewed and are negative. Past Medical History:  Diagnosis Date   Arachnoid cyst    Brain aneurysm    Chronic anticoagulation 05/26/2017   Depression    History of DVT (deep vein thrombosis)    Hypercoagulable state ([place])    Hypertension    Hypothyroidism    Stroke [facility]) 2016   [facility] 11/29/14   Wheelchair bound    Past Surgical History:  Procedure Laterality Date   ABDOMINAL HYSTERECTOMY     patient denies   BIOPSY  01/31/2021   Procedure:  BIOPSY;  Surgeon: Jerene Bears, MD;  Location: [facility] ENDOSCOPY;  Service: Gastroenterology;;   BRAIN SURGERY     aneurysm   COLONOSCOPY WITH PROPOFOL N/A 01/31/2021   Procedure: COLONOSCOPY WITH PROPOFOL;  Surgeon: Jerene Bears, MD;  Location: [place];  Service: Gastroenterology;  Laterality: N/A;   ESOPHAGOGASTRODUODENOSCOPY (EGD) WITH PROPOFOL N/A 01/29/2021   Procedure: ESOPHAGOGASTRODUODENOSCOPY (EGD) WITH PROPOFOL;  Surgeon: Jerene Bears, MD;  Location: [facility] ENDOSCOPY;  Service: Gastroenterology;  Laterality: N/A;   goiter     LAMINECTOMY N/A 07/19/2016   Procedure: LAMINECTOMY THORACIC FOUR THORACIC FIVE, THORACIC SIX TO THORACIC EIGHT,THORACIC TEN TO LUMBAR ONE, FENESTRATION OF ARACHNOID CYSTS;  Surgeon: Consuella Lose, MD;  Location: [place];  Service: Neurosurgery;  Laterality: N/A;   LAMINECTOMY N/A 06/03/2017   Procedure: THORACIC THREE - THORACIC FIVE LAMINECTOMY, FENESTRATION OF ARACHNOID CYST;  Surgeon: Consuella Lose, MD;  Location: [place];  Service: Neurosurgery;  Laterality: N/A;   LAPAROSCOPIC PARTIAL COLECTOMY N/A 02/01/2021   Procedure: LAPAROSCOPIC ASSISTED PARTIAL COLECTOMY;  Surgeon: Georganna Skeans, MD;  Location: [place];  Service: General;  Laterality: N/A;   THYROIDECTOMY     TUBAL LIGATION     Social History:  reports that she has never smoked. She has never used smokeless tobacco. She reports that she does not drink alcohol and does not use drugs.  Allergies  Allergen Reactions   Shellfish-Derived Products Other (See Comments)    ALLERGIC TO SHRIMP!! Made the tongue "feel strange" Per patient, she has since eaten crab legs and did not have another reaction.     History reviewed. No pertinent family history.  Prior to Admission medications   Medication Sig Start Date End Date Taking? Authorizing Provider  acetaminophen (TYLENOL) 325 MG tablet Take 2 tablets (650 mg total) by mouth every 6 (six) hours as needed for mild pain, fever or headache. Patient taking  differently: Take 650 mg by mouth daily as needed (for pain- cannot exceed 3,000 mg in 24 hours). 02/13/21  Yes Elgergawy, Silver Huguenin, MD  Amino Acids-Protein Hydrolys (PRO-STAT AWC) LIQD Take 30 mLs by mouth 3 (three) times daily.   Yes [provider]  antiseptic oral rinse (BIOTENE) LIQD 30 mLs by Mouth Rinse route daily as needed for dry mouth.   Yes [provider]  ARTIFICIAL TEARS 0.2-0.2-1 % SOLN Place 1 drop into both eyes every 2 (two) hours as needed (for dryness/irritation).   Yes [provider]  atorvastatin (LIPITOR) 10 MG tablet Take 5 mg by mouth at bedtime.   Yes [provider]  azelastine (ASTELIN) 0.1 % nasal spray Place 2 sprays into both nostrils 2 (two) times daily as needed for rhinitis or allergies.   Yes [provider]  baclofen (LIORESAL) 10 MG tablet Take 5-10 mg by mouth See admin instructions. Take 5 mg (1/2 tablet) by mouth in the morning and an additional 10 mg by mouth in the afternoon as needed for muscle spasms 05/09/17  Yes [provider]  Baclofen 5 MG TABS Take 5 mg by mouth See admin instructions. Take 5 mg by mouth in the evening as needed for spasms   Yes [provider]  bisacodyl (DULCOLAX) 5 MG EC tablet Take 2 tablets (10 mg total) by mouth daily as needed for moderate constipation. Patient taking differently: Take 10 mg by mouth daily as needed (for constipation). 02/13/21  Yes Elgergawy, Silver Huguenin, MD  Dextran 70-Hypromellose (GENTEAL TEARS) 0.1-0.3 % SOLN Place 2 drops into both eyes at bedtime.   Yes [provider]  ferrous gluconate (FERGON) 324 MG tablet Take 324 mg by mouth every other day.   Yes [provider]  fluticasone (FLONASE) 50 MCG/ACT nasal spray Place 1 spray into both nostrils 2 (two) times daily.   Yes [provider]  gabapentin (NEURONTIN) 100 MG capsule Take 100 mg by mouth as needed (for neuropathic pain).   Yes [provider]   gabapentin (NEURONTIN) 300 MG capsule Take 300 mg by mouth 2 (two) times daily. 07/09/21  Yes [provider]  gabapentin (NEURONTIN) 400 MG capsule Take 400 mg by mouth daily.   Yes [provider]  Glycerin-Hypromellose-PEG 400 (ARTIFICIAL TEARS) 0.2-0.2-1 % SOLN Place 1 drop into both eyes every 2 (two) hours as needed (for dryness).   Yes [provider]  hydrocortisone cream 1 % Apply 1 Application topically 2 (two) times daily as needed for itching.   Yes [provider]  hydrOXYzine (ATARAX/VISTARIL) 25 MG tablet Take 1 tablet (25 mg total) by mouth 3 (three) times daily. Patient taking differently: Take 25 mg by mouth 2 (two) times daily. 02/13/21  Yes Elgergawy, Silver Huguenin, MD  levothyroxine (SYNTHROID) 125 MCG tablet Take 125 mcg by mouth daily before breakfast.   Yes [provider]  Menthol, Topical Analgesic, (BIOFREEZE) 4 % GEL Apply 1 application  topically See admin instructions. Apply to shoulders and legs 2 times a day   Yes [provider]  metoprolol tartrate (LOPRESSOR) 25 MG tablet Take 1 tablet (25 mg total) by mouth 2 (two) times daily. 02/13/21  Yes Elgergawy, Silver Huguenin, MD  Multiple Vitamin (MULTIVITAMIN WITH MINERALS) TABS tablet Take 1 tablet by mouth daily. Patient taking differently: Take 1 tablet by mouth daily with breakfast. 02/13/21  Yes Elgergawy, Silver Huguenin, MD  pantoprazole (PROTONIX) 40 MG tablet Take 1 tablet (40 mg total) by mouth 2 (two) times daily. 02/13/21  Yes Elgergawy, Silver Huguenin, MD  PARoxetine (PAXIL) 10 MG tablet Take 10 mg by mouth in the morning.   Yes [provider]  polyethylene glycol powder (GLYCOLAX/MIRALAX) 17 GM/SCOOP powder Take 17 g by mouth daily as needed for mild constipation (TO BE MIXED WITH 6 OUNCES OF WATER).   Yes [provider]  rivaroxaban (XARELTO) 20 MG TABS tablet Take 1 tablet (20 mg total) by mouth daily with supper. Patient taking differently: Take 20 mg by mouth  at bedtime. 08/23/16  Yes Angiulli, Lavon Paganini, PA-C  saccharomyces boulardii (FLORASTOR) 250 MG capsule Take 250 mg by mouth every other day.   Yes [provider]  sennosides-docusate sodium (SENOKOT-S) 8.6-50 MG tablet Take 2 tablets by mouth daily.   Yes [provider]  Simethicone 180 MG CAPS Take 360 mg by mouth daily as needed (for gas).   Yes [provider]  sodium chloride (OCEAN) 0.65 % SOLN nasal spray Place 2 sprays into both nostrils 3 (three) times daily as needed for congestion.   Yes [provider]  feeding supplement (ENSURE ENLIVE / ENSURE PLUS) LIQD Take 237 mLs by mouth 3 (three) times daily between meals. Patient not taking: Reported on 12/20/2021 02/13/21   Elgergawy, Silver Huguenin, MD    Physical Exam: Vitals:   12/20/21 1430 12/20/21 1500 12/20/21 1530 12/20/21 1600  BP: (!) 116/93 (!) 143/97 116/85 115/87  Pulse: 99 100 (!) 101 100  Resp: '20 20 18 '$ (!) 22  Temp:      TempSrc:      SpO2: 97% 93% 93% 95%   General: NAD, acutely ill-appearing Cardiovascular: S1, S2 present Respiratory: CTAB, noted some mild wheezing Abdomen: Soft, nontender, +distended, no bowel sounds present Musculoskeletal: No bilateral pedal edema noted Skin: Noted some pressure ulcers Psychiatry: Normal mood   Data Reviewed:  Labs and vital signs fairly stable as mentioned above  Assessment and Plan:  High-grade SBO Likely 2/2 adhesions N.p.o., IV fluids, NG tube, pain management  Further management per general surgery Okay for DVT PPx, hold Xarelto  Hypertension Hold p.o. metoprolol As needed IV hydralazine  Hyperlipidemia Hold statins for now  Hypothyroidism Switch to IV Synthroid  Normocytic anemia/ACD/iron deficiency anemia Hemoglobin at baseline Continue oral iron supplement once able  GERD Switch to IV pantoprazole      Advance Care Planning: Full code  Consults: General surgery  Family Communication: None at  bedside  Severity of Illness: The appropriate patient status for this patient is INPATIENT. Inpatient status is judged to be reasonable and necessary in order to provide the required intensity of service to ensure the patient's safety. The patient's presenting symptoms, physical exam findings, and initial radiographic and laboratory data in the context of their chronic comorbidities is felt to place them at high risk for further clinical deterioration. Furthermore, it is not anticipated that the patient will be medically stable for discharge from the hospital within 2 midnights of admission.   * I certify that at the point of admission it is my clinical judgment that the patient will require inpatient hospital care spanning beyond 2 midnights from the point of admission due to high intensity of service, high risk for further  deterioration and high frequency of surveillance required.*  Author: Alma Friendly, MD 12/20/2021 4:51 PM  For on call review [email or web address].

## 2021-12-20 NOTE — ED Notes (Signed)
Attempted NG tube twice, Pt unable to tolerate insertion. Pulled tube out.

## 2021-12-21 ENCOUNTER — Inpatient Hospital Stay (HOSPITAL_COMMUNITY): Payer: Medicare Other

## 2021-12-21 DIAGNOSIS — Z7901 Long term (current) use of anticoagulants: Secondary | ICD-10-CM | POA: Diagnosis not present

## 2021-12-21 DIAGNOSIS — I10 Essential (primary) hypertension: Secondary | ICD-10-CM | POA: Diagnosis not present

## 2021-12-21 DIAGNOSIS — D509 Iron deficiency anemia, unspecified: Secondary | ICD-10-CM | POA: Diagnosis not present

## 2021-12-21 DIAGNOSIS — K56609 Unspecified intestinal obstruction, unspecified as to partial versus complete obstruction: Secondary | ICD-10-CM | POA: Diagnosis not present

## 2021-12-21 DIAGNOSIS — D6859 Other primary thrombophilia: Secondary | ICD-10-CM

## 2021-12-21 DIAGNOSIS — L89154 Pressure ulcer of sacral region, stage 4: Secondary | ICD-10-CM

## 2021-12-21 LAB — COMPREHENSIVE METABOLIC PANEL
ALT: 11 U/L (ref 0–44)
AST: 14 U/L — ABNORMAL LOW (ref 15–41)
Albumin: 3 g/dL — ABNORMAL LOW (ref 3.5–5.0)
Alkaline Phosphatase: 82 U/L (ref 38–126)
Anion gap: 12 (ref 5–15)
BUN: 37 mg/dL — ABNORMAL HIGH (ref 8–23)
CO2: 23 mmol/L (ref 22–32)
Calcium: 8.3 mg/dL — ABNORMAL LOW (ref 8.9–10.3)
Chloride: 107 mmol/L (ref 98–111)
Creatinine, Ser: 1.22 mg/dL — ABNORMAL HIGH (ref 0.44–1.00)
GFR, Estimated: 48 mL/min — ABNORMAL LOW (ref >60)
Glucose, Bld: 95 mg/dL (ref 70–99)
Potassium: 4.1 mmol/L (ref 3.5–5.1)
Sodium: 142 mmol/L (ref 135–145)
Total Bilirubin: 0.9 mg/dL (ref 0.3–1.2)
Total Protein: 8.1 g/dL (ref 6.5–8.1)

## 2021-12-21 LAB — URINALYSIS, ROUTINE W REFLEX MICROSCOPIC
Bilirubin Urine: NEGATIVE
Glucose, UA: NEGATIVE mg/dL
Hgb urine dipstick: NEGATIVE
Ketones, ur: 5 mg/dL — AB
Nitrite: NEGATIVE
Protein, ur: 30 mg/dL — AB
Specific Gravity, Urine: >1.046 — ABNORMAL HIGH (ref 1.005–1.030)
pH: 5 (ref 5.0–8.0)

## 2021-12-21 LAB — CBC
HCT: 37.4 % (ref 36.0–46.0)
Hemoglobin: 12 g/dL (ref 12.0–15.0)
MCH: 26.4 pg (ref 26.0–34.0)
MCHC: 32.1 g/dL (ref 30.0–36.0)
MCV: 82.2 fL (ref 80.0–100.0)
Platelets: 272 10*3/uL (ref 150–400)
RBC: 4.55 MIL/uL (ref 3.87–5.11)
RDW: 18.6 % — ABNORMAL HIGH (ref 11.5–15.5)
WBC: 4.8 10*3/uL (ref 4.0–10.5)
nRBC: 0 % (ref 0.0–0.2)

## 2021-12-21 LAB — PROTIME-INR
INR: 1.7 — ABNORMAL HIGH (ref 0.8–1.2)
Prothrombin Time: 19.4 seconds — ABNORMAL HIGH (ref 11.4–15.2)

## 2021-12-21 MED ORDER — PHENOL 1.4 % MT LIQD
1.0000 | OROMUCOSAL | Status: DC | PRN
  Administered 2021-12-27: 1 via OROMUCOSAL
  Filled 2021-12-21: qty 177

## 2021-12-21 MED ORDER — METOPROLOL TARTRATE 5 MG/5ML IV SOLN
2.5000 mg | Freq: Three times a day (TID) | INTRAVENOUS | Status: DC | PRN
  Administered 2021-12-21 – 2021-12-29 (×6): 2.5 mg via INTRAVENOUS
  Filled 2021-12-21 (×6): qty 5

## 2021-12-21 NOTE — Progress Notes (Signed)
NP Garner Nash was notified that patient hasn't voided all night and bladder scanner shows >529 mls of urine.

## 2021-12-21 NOTE — Progress Notes (Signed)
Subjective/Chief Complaint: States she had some flatus, no ab pain   Objective: Vital signs in last 24 hours: Temp:  [97.9 F (36.6 C)-100.9 F (38.3 C)] 98 F (36.7 C) (06/23 0545) Pulse Rate:  [99-110] 100 (06/23 0519) Resp:  [17-24] 17 (06/23 0519) BP: (109-149)/(80-100) 122/84 (06/23 0519) SpO2:  [89 %-100 %] 100 % (06/23 0519) Weight:  [79.7 kg] 79.7 kg (06/22 1823) Last BM Date : 12/16/21  Intake/Output from previous day: 06/22 0701 - 06/23 0700 In: 1045.2 [I.V.:1045.2] Out: 2150 [Urine:550; Emesis/NG output:1600] Intake/Output this shift: No intake/output data recorded.  GI: soft nontender today, few bs  Lab Results:  Recent Labs    12/20/21 1214 12/21/21 0507  WBC 5.2 4.8  HGB 11.3* 12.0  HCT 35.2* 37.4  PLT 270 272   BMET Recent Labs    12/20/21 1306 12/21/21 0507  NA 138 142  K 3.6 4.1  CL 105 107  CO2 24 23  GLUCOSE 103* 95  BUN 29* 37*  CREATININE 0.85 1.22*  CALCIUM 8.1* 8.3*   PT/INR Recent Labs    12/21/21 0507  LABPROT 19.4*  INR 1.7*   ABG No results for input(s): "PHART", "HCO3" in the last 72 hours.  Invalid input(s): "PCO2", "PO2"  Studies/Results: DG Abd Portable 1V-Small Bowel Protocol-Position Verification  Result Date: 12/20/2021 CLINICAL DATA:  NG tube placement EXAM: PORTABLE ABDOMEN - 1 VIEW COMPARISON:  CT 12/20/2021 FINDINGS: Elevated left diaphragm with large hiatal hernia and intrathoracic stomach. Esophageal tube tip is below the diaphragm and probably in the region of gastroduodenal junction. Multiple loops of dilated small bowel consistent with an obstruction IMPRESSION: 1. Esophageal tube tip likely over the gastroduodenal junction 2. Elevated left diaphragm with large hiatal hernia and intrathoracic stomach. Subsegmental atelectasis at the left base. 3. Multiple dilated loops of small bowel consistent with an obstruction Electronically Signed   By: Jasmine Pang M.D.   On: 12/20/2021 23:24   CT ABDOMEN PELVIS  W CONTRAST  Result Date: 12/20/2021 CLINICAL DATA:  Abdominal pain, acute, nonlocalized n/v, no BM x5 days, diffuse abd pain lower EXAM: CT ABDOMEN AND PELVIS WITH CONTRAST TECHNIQUE: Multidetector CT imaging of the abdomen and pelvis was performed using the standard protocol following bolus administration of intravenous contrast. RADIATION DOSE REDUCTION: This exam was performed according to the departmental dose-optimization program which includes automated exposure control, adjustment of the mA and/or kV according to patient size and/or use of iterative reconstruction technique. CONTRAST:  OMNIPAQUE IOHEXOL 300 MG/ML  SOLN COMPARISON:  CT 02/01/2021 FINDINGS: Lower chest: Bibasilar atelectasis. Unchanged elevated left hemidiaphragm. Dilated and fluid-filled visualized esophagus. Hepatobiliary: No focal liver abnormality is seen. Mild gallbladder distension. Pancreas: Unremarkable. No pancreatic ductal dilatation or surrounding inflammatory changes. Spleen: Normal in size without focal abnormality. Adrenals/Urinary Tract: Adrenal glands are unremarkable. No hydronephrosis. There are nonobstructing left lower pole renal stones largest measuring up to 4 mm. The bladder is moderately distended. Stomach/Bowel: Large hiatal hernia with dilated partially intrathoracic stomach. Multiple dilated loops of small bowel with transition point in the right upper abdomen (series 2, image 38, series 6, image 29-30). There is mesenteric edema.Prior right hemicolectomy with ileocolic anastomosis. Vascular/Lymphatic: Scattered atherosclerosis. No AAA. No lymphadenopathy. Reproductive: There is a subserosal or pedunculated calcified uterine fibroid which now appears on the right, likely due to rightward mass effect on the uterus by ascites and previously described small bowel obstruction. Other: Small volume abdominopelvic ascites.  No free air. Musculoskeletal: No acute osseous abnormality. Unchanged anterolisthesis at  L4-L5. Moderate bilateral hip osteoarthritis. 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 Chronic changes at the origin of the left rectus femoris. There is diffuse muscle atrophy. IMPRESSION: High-grade small-bowel obstruction with transition point in the right upper abdomen, likely due to adhesions. Large hiatal hernia with dilated partially intrathoracic stomach related to the SBO. Small volume abdominopelvic ascites. Additional chronic findings as described above. Electronically Signed   By: Caprice Renshaw M.D.   On: 12/20/2021 15:27   DG Chest Portable 1 View  Result Date: 12/20/2021 CLINICAL DATA:  Abdominal pain and vomiting. Small-bowel obstruction. EXAM: PORTABLE CHEST 1 VIEW COMPARISON:  CT of the abdomen and pelvis 02/01/2021 FINDINGS: The heart is enlarged. Chronic elevation of left hemidiaphragm present. The stomach is within the hernia, dilated. Additional loops of bowel are present within the hernia. Moderate distension of small bowel noted in the abdomen. IMPRESSION: 1. Moderate distension of small bowel compatible with small bowel obstruction or ileus. Dedicated imaging of the abdomen scratched at dedicated radiographs of the abdomen or CT of the abdomen pelvis would be useful for further evaluation. 2. Chronic elevation of left hemidiaphragm/hernia. 3. Cardiomegaly. Electronically Signed   By: Marin Roberts M.D.   On: 12/20/2021 13:03    Anti-infectives: Anti-infectives (From admission, onward)    None       Assessment/Plan: SBO, likely adhesive -partial given her clinical findings, ng in with bilious output -no indication for surgery now -will follow up results of protocol -do not think fever is coming from her abdominal process -hold DOAC  I reviewed hospitalist notes, last 24 h vitals and pain scores, last 48 h intake and output, last 24 h labs and trends, and last 24 h imaging results.  This care required straight-forward level of medical decision  making.    Emelia Loron 12/21/2021

## 2021-12-21 NOTE — Progress Notes (Signed)
[place] escalated vitals frequency increased, prn medicine given, notified Charge nurse Donavan Burnet, RN. We will continue to monitor.

## 2021-12-21 NOTE — TOC Initial Note (Signed)
Transition of Care [facility]) - Initial/Assessment Note   Patient Details  Name: Katrina Donaldson MRN: [id number] Date of Birth: [birth year]  Transition of [facility]) CM/SW Contact:    Ewing Schlein, LCSW Phone Number: 12/21/2021, 1:40 PM  Clinical Narrative: Patient is from a SNF. CSW confirmed with daughter, Corey Harold, that patient is a resident of [facility] and the plan is for patient to return there. Daughter reported patient does require moderate assistance with ADLs at baseline.  CSW confirmed with Lawerance Cruel in admissions at [facility] that patient is a long-term care resident. FL2 started.                 Expected Discharge Plan: Skilled Nursing Facility Barriers to Discharge: Continued Medical Work up  Patient Goals and CMS Choice Patient states their goals for this hospitalization and ongoing recovery are:: Return to [facility] CMS Medicare.gov Compare Post Acute Care list provided to:: Patient Represenative (must comment) Choice offered to / list presented to : Patient, Adult Children  Expected Discharge Plan and Services Expected Discharge Plan: Skilled Nursing Facility In-house Referral: Clinical Social Work Post Acute Care Choice: Skilled Nursing Facility Living arrangements for the past 2 months: Skilled Nursing Facility             DME Arranged: N/A DME Agency: NA  Prior Living Arrangements/Services Living arrangements for the past 2 months: Skilled Nursing Facility Lives with:: Facility Resident Patient language and need for interpreter reviewed:: Yes Do you feel safe going back to the place where you live?: Yes      Need for Family Participation in Patient Care: Yes (Comment) Care giver support system in place?: Yes (comment) Criminal Activity/Legal Involvement Pertinent to Current Situation/Hospitalization: No - Comment as needed  Activities of Daily Living Home Assistive Devices/Equipment: Wheelchair, Hospital bed, [employer], Dentures (specify type)  (upper and lower partials) ADL Screening (condition at time of admission) Patient's cognitive ability adequate to safely complete daily activities?: Yes Is the patient deaf or have difficulty hearing?: No Does the patient have difficulty seeing, even when wearing glasses/contacts?: No Does the patient have difficulty concentrating, remembering, or making decisions?: No Patient able to express need for assistance with ADLs?: Yes Does the patient have difficulty dressing or bathing?: Yes Independently performs ADLs?: No Communication: Independent Dressing (OT): Needs assistance Is this a change from baseline?: Pre-admission baseline Grooming: Independent Feeding: Independent Bathing: Needs assistance Is this a change from baseline?: Pre-admission baseline Toileting: Needs assistance Is this a change from baseline?: Pre-admission baseline In/Out Bed: Needs assistance Is this a change from baseline?: Pre-admission baseline Walks in Home: Dependent Is this a change from baseline?: Pre-admission baseline Does the patient have difficulty walking or climbing stairs?: Yes Weakness of Legs: Both Weakness of Arms/Hands: Both  Permission Sought/Granted Permission sought to share information with : Facility Industrial/product designer granted to share information with : Yes, Verbal Permission Granted Permission granted to share info w AGENCY: [facility]  Emotional Assessment Orientation: : Oriented to Self, Oriented to Place, Oriented to  Time, Oriented to Situation Alcohol / Substance Use: Not Applicable  Admission diagnosis:  Small bowel obstruction (HCC) [K56.609] SBO (small bowel obstruction) (HCC) [K56.609] Patient Active Problem List   Diagnosis Date Noted   SBO (small bowel obstruction) (HCC) 12/20/2021   Medication monitoring encounter 06/21/2021   Sacral osteomyelitis (HCC) 06/21/2021   Pressure injury of sacral region, stage 4 (HCC) 06/21/2021   Cecum mass    Abnormal  computed tomography angiography (CTA)  of abdomen and pelvis    Pressure injury of skin 01/27/2021   GI bleed 01/26/2021   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   Chronic anticoagulation 05/26/2017   Urinary retention    Acute pain of right knee    Acute lower UTI    Weakness of right lower extremity    Confusion    Hydrocephalus (HCC)    Poor appetite    Acute blood loss anemia    Post-operative pain    Hypokalemia    Leukocytosis    Thrombocytopenia (HCC)    Acute deep vein thrombosis (DVT) of femoral vein of left lower extremity (HCC)    Myelopathy (HCC) 07/24/2016   Thoracic myelopathy    Depression    Benign essential HTN    History of subarachnoid hemorrhage    Hypercoagulable state (HCC)    Constipation due to pain medication    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   Protein-calorie malnutrition (HCC) 12/21/2014   Thyroid activity decreased 12/21/2014   SAH (subarachnoid hemorrhage), 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 [place] [address] Phone: [phone] Fax: [phone]  Readmission Risk Interventions     No data to display

## 2021-12-22 ENCOUNTER — Inpatient Hospital Stay (HOSPITAL_COMMUNITY): Payer: Medicare Other

## 2021-12-22 DIAGNOSIS — M4628 Osteomyelitis of vertebra, sacral and sacrococcygeal region: Secondary | ICD-10-CM

## 2021-12-22 DIAGNOSIS — E039 Hypothyroidism, unspecified: Secondary | ICD-10-CM | POA: Diagnosis not present

## 2021-12-22 DIAGNOSIS — K56609 Unspecified intestinal obstruction, unspecified as to partial versus complete obstruction: Secondary | ICD-10-CM | POA: Diagnosis not present

## 2021-12-22 DIAGNOSIS — I10 Essential (primary) hypertension: Secondary | ICD-10-CM | POA: Diagnosis not present

## 2021-12-22 DIAGNOSIS — L89154 Pressure ulcer of sacral region, stage 4: Secondary | ICD-10-CM | POA: Diagnosis not present

## 2021-12-22 LAB — MAGNESIUM: Magnesium: 2.2 mg/dL (ref 1.7–2.4)

## 2021-12-22 LAB — CBC WITH DIFFERENTIAL/PLATELET
Abs Immature Granulocytes: 0.02 10*3/uL (ref 0.00–0.07)
Basophils Absolute: 0 10*3/uL (ref 0.0–0.1)
Basophils Relative: 0 %
Eosinophils Absolute: 0.1 10*3/uL (ref 0.0–0.5)
Eosinophils Relative: 3 %
HCT: 32.6 % — ABNORMAL LOW (ref 36.0–46.0)
Hemoglobin: 10.4 g/dL — ABNORMAL LOW (ref 12.0–15.0)
Immature Granulocytes: 1 %
Lymphocytes Relative: 23 %
Lymphs Abs: 0.6 10*3/uL — ABNORMAL LOW (ref 0.7–4.0)
MCH: 26.1 pg (ref 26.0–34.0)
MCHC: 31.9 g/dL (ref 30.0–36.0)
MCV: 81.9 fL (ref 80.0–100.0)
Monocytes Absolute: 0.3 10*3/uL (ref 0.1–1.0)
Monocytes Relative: 12 %
Neutro Abs: 1.7 10*3/uL (ref 1.7–7.7)
Neutrophils Relative %: 61 %
Platelets: 252 10*3/uL (ref 150–400)
RBC: 3.98 MIL/uL (ref 3.87–5.11)
RDW: 18.8 % — ABNORMAL HIGH (ref 11.5–15.5)
WBC: 2.8 10*3/uL — ABNORMAL LOW (ref 4.0–10.5)
nRBC: 0 % (ref 0.0–0.2)

## 2021-12-22 LAB — BASIC METABOLIC PANEL WITH GFR
Anion gap: 15 (ref 5–15)
BUN: 33 mg/dL — ABNORMAL HIGH (ref 8–23)
CO2: 19 mmol/L — ABNORMAL LOW (ref 22–32)
Calcium: 8.2 mg/dL — ABNORMAL LOW (ref 8.9–10.3)
Chloride: 111 mmol/L (ref 98–111)
Creatinine, Ser: 1.03 mg/dL — ABNORMAL HIGH (ref 0.44–1.00)
GFR, Estimated: 59 mL/min — ABNORMAL LOW
Glucose, Bld: 84 mg/dL (ref 70–99)
Potassium: 3.4 mmol/L — ABNORMAL LOW (ref 3.5–5.1)
Sodium: 145 mmol/L (ref 135–145)

## 2021-12-22 MED ORDER — POTASSIUM CHLORIDE 10 MEQ/100ML IV SOLN
10.0000 meq | INTRAVENOUS | Status: AC
  Administered 2021-12-22 (×4): 10 meq via INTRAVENOUS
  Filled 2021-12-22 (×4): qty 100

## 2021-12-22 MED ORDER — DIATRIZOATE MEGLUMINE & SODIUM 66-10 % PO SOLN
90.0000 mL | Freq: Once | ORAL | Status: AC
  Administered 2021-12-22: 90 mL via NASOGASTRIC
  Filled 2021-12-22: qty 90

## 2021-12-22 MED ORDER — POLYVINYL ALCOHOL 1.4 % OP SOLN
1.0000 [drp] | OPHTHALMIC | Status: DC | PRN
  Administered 2021-12-22 – 2022-01-03 (×6): 1 [drp] via OPHTHALMIC
  Filled 2021-12-22: qty 15

## 2021-12-22 MED ORDER — LIP MEDEX EX OINT
1.0000 | TOPICAL_OINTMENT | CUTANEOUS | Status: DC | PRN
  Administered 2021-12-22: 1 via TOPICAL
  Filled 2021-12-22: qty 7

## 2021-12-22 NOTE — Progress Notes (Addendum)
Subjective/Chief Complaint: Not sure if she has passed flatus yet   Objective: Vital signs in last 24 hours: Temp:  [98 F (36.7 C)-99.2 F (37.3 C)] 98.4 F (36.9 C) (06/24 1009) Pulse Rate:  [107-120] 107 (06/24 1009) Resp:  [16-18] 16 (06/24 1009) BP: (115-131)/(61-83) 127/73 (06/24 1009) SpO2:  [96 %-100 %] 99 % (06/24 1009) Last BM Date : 12/16/21  Intake/Output from previous day: 06/23 0701 - 06/24 0700 In: 1770.9 [I.V.:1770.9] Out: 1100 [Urine:500; Emesis/NG output:600] Intake/Output this shift: No intake/output data recorded.  Ab mild distended nontender some bs  Lab Results:  Recent Labs    12/21/21 0507 12/22/21 0515  WBC 4.8 2.8*  HGB 12.0 10.4*  HCT 37.4 32.6*  PLT 272 252   BMET Recent Labs    12/21/21 0507 12/22/21 0515  NA 142 145  K 4.1 3.4*  CL 107 111  CO2 23 19*  GLUCOSE 95 84  BUN 37* 33*  CREATININE 1.22* 1.03*  CALCIUM 8.3* 8.2*   PT/INR Recent Labs    12/21/21 0507  LABPROT 19.4*  INR 1.7*   ABG No results for input(s): "PHART", "HCO3" in the last 72 hours.  Invalid input(s): "PCO2", "PO2"  Studies/Results: DG Chest Port 1 View  Result Date: 12/21/2021 CLINICAL DATA:  [id number] fever EXAM: PORTABLE CHEST 1 VIEW COMPARISON:  Chest x-ray from yesterday nausea correlation is made with a CT of the abdomen and pelvis from yesterday FINDINGS: There has been interval insertion of an NG tube with its distal end at the right upper-mid abdomen. There has been interval partial decompression of the dilated stomach at the left lower thorax. Large hiatal hernia containing the stomach extending up to the left mid thorax. Left basilar atelectasis. Cardiomegaly. Right lung remains clear. Moderately severe dilatation of the small bowel loops of small bowel obstruction. IMPRESSION: There has been interval insertion of an NG tube with its distal end of the right upper-mid abdomen. There has been interval partial decompression of the herniated  stomach through the hiatal hernia extending up to the mid left hemithorax. Left basilar atelectasis. Findings of high-grade small-bowel obstruction. Electronically Signed   By: Marjo Bicker M.D.   On: 12/21/2021 08:57   DG Abd Portable 1V-Small Bowel Protocol-Position Verification  Result Date: 12/20/2021 CLINICAL DATA:  NG tube placement EXAM: PORTABLE ABDOMEN - 1 VIEW COMPARISON:  CT 12/20/2021 FINDINGS: Elevated left diaphragm with large hiatal hernia and intrathoracic stomach. Esophageal tube tip is below the diaphragm and probably in the region of gastroduodenal junction. Multiple loops of dilated small bowel consistent with an obstruction IMPRESSION: 1. Esophageal tube tip likely over the gastroduodenal junction 2. Elevated left diaphragm with large hiatal hernia and intrathoracic stomach. Subsegmental atelectasis at the left base. 3. Multiple dilated loops of small bowel consistent with an obstruction Electronically Signed   By: Jasmine Pang M.D.   On: 12/20/2021 23:24   CT ABDOMEN PELVIS W CONTRAST  Result Date: 12/20/2021 CLINICAL DATA:  Abdominal pain, acute, nonlocalized n/v, no BM x5 days, diffuse abd pain lower EXAM: CT ABDOMEN AND PELVIS WITH CONTRAST TECHNIQUE: Multidetector CT imaging of the abdomen and pelvis was performed using the standard protocol following bolus administration of intravenous contrast. RADIATION DOSE REDUCTION: This exam was performed according to the departmental dose-optimization program which includes automated exposure control, adjustment of the mA and/or kV according to patient size and/or use of iterative reconstruction technique. CONTRAST:  OMNIPAQUE IOHEXOL 300 MG/ML  SOLN COMPARISON:  CT 02/01/2021 FINDINGS: Lower chest:  Bibasilar atelectasis. Unchanged elevated left hemidiaphragm. Dilated and fluid-filled visualized esophagus. Hepatobiliary: No focal liver abnormality is seen. Mild gallbladder distension. Pancreas: Unremarkable. No pancreatic ductal  dilatation or surrounding inflammatory changes. Spleen: Normal in size without focal abnormality. Adrenals/Urinary Tract: Adrenal glands are unremarkable. No hydronephrosis. There are nonobstructing left lower pole renal stones largest measuring up to 4 mm. The bladder is moderately distended. Stomach/Bowel: Large hiatal hernia with dilated partially intrathoracic stomach. Multiple dilated loops of small bowel with transition point in the right upper abdomen (series 2, image 38, series 6, image 29-30). There is mesenteric edema.Prior right hemicolectomy with ileocolic anastomosis. Vascular/Lymphatic: Scattered atherosclerosis. No AAA. No lymphadenopathy. Reproductive: There is a subserosal or pedunculated calcified uterine fibroid which now appears on the right, likely due to rightward mass effect on the uterus by ascites and previously described small bowel obstruction. Other: Small volume abdominopelvic ascites.  No free air. Musculoskeletal: No acute osseous abnormality. Unchanged anterolisthesis at L4-L5. Moderate bilateral hip osteoarthritis. 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 Chronic changes at the origin of the left rectus femoris. There is diffuse muscle atrophy. IMPRESSION: High-grade small-bowel obstruction with transition point in the right upper abdomen, likely due to adhesions. Large hiatal hernia with dilated partially intrathoracic stomach related to the SBO. Small volume abdominopelvic ascites. Additional chronic findings as described above. Electronically Signed   By: Caprice Renshaw M.D.   On: 12/20/2021 15:27   DG Chest Portable 1 View  Result Date: 12/20/2021 CLINICAL DATA:  Abdominal pain and vomiting. Small-bowel obstruction. EXAM: PORTABLE CHEST 1 VIEW COMPARISON:  CT of the abdomen and pelvis 02/01/2021 FINDINGS: The heart is enlarged. Chronic elevation of left hemidiaphragm present. The stomach is within the hernia, dilated. Additional loops of bowel are present  within the hernia. Moderate distension of small bowel noted in the abdomen. IMPRESSION: 1. Moderate distension of small bowel compatible with small bowel obstruction or ileus. Dedicated imaging of the abdomen scratched at dedicated radiographs of the abdomen or CT of the abdomen pelvis would be useful for further evaluation. 2. Chronic elevation of left hemidiaphragm/hernia. 3. Cardiomegaly. Electronically Signed   By: Marin Roberts M.D.   On: 12/20/2021 13:03    Anti-infectives: Anti-infectives (From admission, onward)    None       Assessment/Plan: SBO, likely adhesive -partial given her clinical findings, ng in with bilious output -no indication for surgery now -will follow up results of protocol has not gotten xray yet so will do that now, hopefully will resolve without surgery -hold DOAC   I reviewed hospitalist notes, last 24 h vitals and pain scores, last 48 h intake and output, last 24 h labs and trends, and last 24 h imaging results.   This care required straight-forward level of medical decision making.     Emelia Loron 12/22/2021   Addendum: xray with sbo, I am not sure contrast ever given appropriately.  She thinks she has some flatus. I am going to repeat (or do correctly) the protocol today.  She certainly may need surgery if this is not resolving

## 2021-12-22 NOTE — Progress Notes (Signed)
Bladder scanner showed 412 ml , Patient made aware that we need to do an in and out catheter to empty her bladder , questions has been answered and patient was educated. Patient tolerated the whole process , 500 cc of amber colored urine was recorded. We will continue to monitor.

## 2021-12-23 ENCOUNTER — Inpatient Hospital Stay (HOSPITAL_COMMUNITY): Payer: Medicare Other

## 2021-12-23 DIAGNOSIS — L89154 Pressure ulcer of sacral region, stage 4: Secondary | ICD-10-CM | POA: Diagnosis not present

## 2021-12-23 DIAGNOSIS — I10 Essential (primary) hypertension: Secondary | ICD-10-CM | POA: Diagnosis not present

## 2021-12-23 DIAGNOSIS — K56609 Unspecified intestinal obstruction, unspecified as to partial versus complete obstruction: Secondary | ICD-10-CM | POA: Diagnosis not present

## 2021-12-23 DIAGNOSIS — E039 Hypothyroidism, unspecified: Secondary | ICD-10-CM | POA: Diagnosis not present

## 2021-12-23 LAB — CBC WITH DIFFERENTIAL/PLATELET
Abs Immature Granulocytes: 0 10*3/uL (ref 0.00–0.07)
Band Neutrophils: 14 %
Basophils Absolute: 0 10*3/uL (ref 0.0–0.1)
Basophils Relative: 0 %
Eosinophils Absolute: 0 10*3/uL (ref 0.0–0.5)
Eosinophils Relative: 0 %
HCT: 32.4 % — ABNORMAL LOW (ref 36.0–46.0)
Hemoglobin: 10.6 g/dL — ABNORMAL LOW (ref 12.0–15.0)
Lymphocytes Relative: 9 %
Lymphs Abs: 0.4 10*3/uL — ABNORMAL LOW (ref 0.7–4.0)
MCH: 26.5 pg (ref 26.0–34.0)
MCHC: 32.7 g/dL (ref 30.0–36.0)
MCV: 81 fL (ref 80.0–100.0)
Monocytes Absolute: 0.3 10*3/uL (ref 0.1–1.0)
Monocytes Relative: 7 %
Neutro Abs: 3.4 10*3/uL (ref 1.7–7.7)
Neutrophils Relative %: 70 %
Platelets: 241 10*3/uL (ref 150–400)
RBC: 4 MIL/uL (ref 3.87–5.11)
RDW: 19.4 % — ABNORMAL HIGH (ref 11.5–15.5)
WBC: 4 10*3/uL (ref 4.0–10.5)
nRBC: 0 % (ref 0.0–0.2)
nRBC: 2 /100 WBC — ABNORMAL HIGH

## 2021-12-23 LAB — BASIC METABOLIC PANEL
Anion gap: 12 (ref 5–15)
BUN: 23 mg/dL (ref 8–23)
CO2: 21 mmol/L — ABNORMAL LOW (ref 22–32)
Calcium: 8.7 mg/dL — ABNORMAL LOW (ref 8.9–10.3)
Chloride: 117 mmol/L — ABNORMAL HIGH (ref 98–111)
Creatinine, Ser: 0.91 mg/dL (ref 0.44–1.00)
GFR, Estimated: >60 mL/min (ref >60)
Glucose, Bld: 96 mg/dL (ref 70–99)
Potassium: 3.7 mmol/L (ref 3.5–5.1)
Sodium: 150 mmol/L — ABNORMAL HIGH (ref 135–145)

## 2021-12-23 MED ORDER — DEXTROSE 5 % IV SOLN: INTRAVENOUS | Status: DC

## 2021-12-23 NOTE — Plan of Care (Signed)
  Problem: Education: Goal: Knowledge of General Education information will improve Description Including pain rating scale, medication(s)/side effects and non-pharmacologic comfort measures Outcome: Progressing   

## 2021-12-24 ENCOUNTER — Inpatient Hospital Stay (HOSPITAL_COMMUNITY): Payer: Medicare Other

## 2021-12-24 DIAGNOSIS — L89154 Pressure ulcer of sacral region, stage 4: Secondary | ICD-10-CM | POA: Diagnosis not present

## 2021-12-24 DIAGNOSIS — I10 Essential (primary) hypertension: Secondary | ICD-10-CM | POA: Diagnosis not present

## 2021-12-24 DIAGNOSIS — K56609 Unspecified intestinal obstruction, unspecified as to partial versus complete obstruction: Secondary | ICD-10-CM | POA: Diagnosis not present

## 2021-12-24 DIAGNOSIS — E039 Hypothyroidism, unspecified: Secondary | ICD-10-CM | POA: Diagnosis not present

## 2021-12-24 LAB — CBC WITH DIFFERENTIAL/PLATELET
Abs Immature Granulocytes: 0.05 10*3/uL (ref 0.00–0.07)
Basophils Absolute: 0 10*3/uL (ref 0.0–0.1)
Basophils Relative: 1 %
Eosinophils Absolute: 0.1 10*3/uL (ref 0.0–0.5)
Eosinophils Relative: 1 %
HCT: 30 % — ABNORMAL LOW (ref 36.0–46.0)
Hemoglobin: 10.1 g/dL — ABNORMAL LOW (ref 12.0–15.0)
Immature Granulocytes: 1 %
Lymphocytes Relative: 21 %
Lymphs Abs: 0.9 10*3/uL (ref 0.7–4.0)
MCH: 26.2 pg (ref 26.0–34.0)
MCHC: 33.7 g/dL (ref 30.0–36.0)
MCV: 77.9 fL — ABNORMAL LOW (ref 80.0–100.0)
Monocytes Absolute: 0.7 10*3/uL (ref 0.1–1.0)
Monocytes Relative: 16 %
Neutro Abs: 2.5 10*3/uL (ref 1.7–7.7)
Neutrophils Relative %: 60 %
Platelets: 253 10*3/uL (ref 150–400)
RBC: 3.85 MIL/uL — ABNORMAL LOW (ref 3.87–5.11)
RDW: 18.7 % — ABNORMAL HIGH (ref 11.5–15.5)
WBC: 4.2 10*3/uL (ref 4.0–10.5)
nRBC: 0.5 % — ABNORMAL HIGH (ref 0.0–0.2)

## 2021-12-24 LAB — BASIC METABOLIC PANEL
Anion gap: 7 (ref 5–15)
BUN: 19 mg/dL (ref 8–23)
CO2: 27 mmol/L (ref 22–32)
Calcium: 8.7 mg/dL — ABNORMAL LOW (ref 8.9–10.3)
Chloride: 111 mmol/L (ref 98–111)
Creatinine, Ser: 0.74 mg/dL (ref 0.44–1.00)
GFR, Estimated: >60 mL/min (ref >60)
Glucose, Bld: 130 mg/dL — ABNORMAL HIGH (ref 70–99)
Potassium: 3 mmol/L — ABNORMAL LOW (ref 3.5–5.1)
Sodium: 145 mmol/L (ref 135–145)

## 2021-12-24 LAB — MAGNESIUM: Magnesium: 2.2 mg/dL (ref 1.7–2.4)

## 2021-12-24 MED ORDER — POTASSIUM CHLORIDE 10 MEQ/100ML IV SOLN
10.0000 meq | INTRAVENOUS | Status: AC
  Administered 2021-12-24 (×4): 10 meq via INTRAVENOUS
  Filled 2021-12-24 (×4): qty 100

## 2021-12-24 MED ORDER — DEXTROSE-NACL 5-0.45 % IV SOLN: INTRAVENOUS | Status: AC

## 2021-12-24 MED ORDER — DIATRIZOATE MEGLUMINE & SODIUM 66-10 % PO SOLN
90.0000 mL | Freq: Once | ORAL | Status: AC
  Administered 2021-12-24: 90 mL via NASOGASTRIC
  Filled 2021-12-24: qty 90

## 2021-12-24 NOTE — Progress Notes (Signed)
Subjective: Patient not talking much today.  Doesn't think she has had bowel function.  Unclear why SBO protocol didn't get done yesterday as ordered.  ROS: See above, otherwise other systems negative  Objective: Vital signs in last 24 hours: Temp:  [98.2 F (36.8 C)-100 F (37.8 C)] 98.9 F (37.2 C) (06/26 0853) Pulse Rate:  [116-133] 119 (06/26 0853) Resp:  [18-22] 18 (06/26 0853) BP: (113-138)/(70-80) 113/75 (06/26 0853) SpO2:  [92 %-100 %] 97 % (06/26 0853) Last BM Date : 12/20/21  Intake/Output from previous day: 06/25 0701 - 06/26 0700 In: 2173.3 [I.V.:2163.3; NG/GT:10] Out: 2020 [Urine:420; Emesis/NG output:1600] Intake/Output this shift: No intake/output data recorded.  PE: Abd: distended some but obese as well, NGT not working initially and on continuous suction.  Flushed and placed on LIWS and began to work well.  Lab Results:  Recent Labs    12/23/21 0453 12/24/21 0228  WBC 4.0 4.2  HGB 10.6* 10.1*  HCT 32.4* 30.0*  PLT 241 253   BMET Recent Labs    12/23/21 0453 12/24/21 0228  NA 150* 145  K 3.7 3.0*  CL 117* 111  CO2 21* 27  GLUCOSE 96 130*  BUN 23 19  CREATININE 0.91 0.74  CALCIUM 8.7* 8.7*   PT/INR No results for input(s): "LABPROT", "INR" in the last 72 hours. CMP     Component Value Date/Time   NA 145 12/24/2021 0228   NA 133 (A) 12/14/2014 0000   K 3.0 (L) 12/24/2021 0228   CL 111 12/24/2021 0228   CO2 27 12/24/2021 0228   GLUCOSE 130 (H) 12/24/2021 0228   BUN 19 12/24/2021 0228   BUN 8 12/14/2014 0000   CREATININE 0.74 12/24/2021 0228   CREATININE 0.56 06/21/2021 1106   CALCIUM 8.7 (L) 12/24/2021 0228   PROT 8.1 12/21/2021 0507   ALBUMIN 3.0 (L) 12/21/2021 0507   AST 14 (L) 12/21/2021 0507   ALT 11 12/21/2021 0507   ALKPHOS 82 12/21/2021 0507   BILITOT 0.9 12/21/2021 0507   GFRNONAA >60 12/24/2021 0228   GFRAA >60 06/04/2017 0230   Lipase     Component Value Date/Time   LIPASE 44 12/20/2021 1306        Studies/Results: DG Abd Portable 1V  Result Date: 12/23/2021 CLINICAL DATA:  Nasogastric tube placement. Small-bowel obstruction. EXAM: PORTABLE ABDOMEN - 1 VIEW COMPARISON:  Prior today FINDINGS: A nasogastric tube is now seen which appears coiled within a hiatal hernia. Moderately dilated small bowel loops show mild decrease in dilatation since prior exam. IMPRESSION: Nasogastric tube appears coiled within a hiatal hernia. Mildly decreased dilatation of small bowel loops since prior exam. Electronically Signed   By: Danae Orleans M.D.   On: 12/23/2021 16:27   DG Abd Portable 1V-Small Bowel Obstruction Protocol-24 hr delay  Result Date: 12/23/2021 CLINICAL DATA:  Small bowel obstruction protocol, 24 hour delayed image. EXAM: PORTABLE ABDOMEN - 1 VIEW COMPARISON:  Abdominal x-rays 12/22/2021. FINDINGS: Contrast material remains in small bowel loops in the left lower quadrant. No colonic contrast identified. Small bowel loops are air-filled and dilated measuring up to 6.7 cm, similar to the prior study. Calcified uterine fibroid again noted. IMPRESSION: 1. Contrast material remains in small bowel loops in the left lower quadrant. Dilated small bowel is unchanged. Findings are compatible with small-bowel obstruction. Electronically Signed   By: Darliss Cheney M.D.   On: 12/23/2021 15:27   DG Abd Portable 1V-Small Bowel Obstruction Protocol-initial, 8 hr delay  Result Date: 12/22/2021 CLINICAL DATA:  8 hour small-bowel follow up EXAM: PORTABLE ABDOMEN - 1 VIEW COMPARISON:  Film from earlier in the same day. FINDINGS: Multiple loops of dilated small bowel are again identified. Previously administered contrast lies predominately within the mid to distal small bowel. No colonic contrast is noted. 24 hour follow-up is recommended. Calcified uterine fibroid is seen. IMPRESSION: Administered contrast lies within the dilated small bowel. No colonic contrast is seen. 24 hour follow-up is recommended.  Electronically Signed   By: Alcide Clever M.D.   On: 12/22/2021 22:11   DG Abd Portable 1V  Result Date: 12/22/2021 CLINICAL DATA:  Small-bowel obstruction EXAM: PORTABLE ABDOMEN - 1 VIEW COMPARISON:  12/20/2021 FINDINGS: NG tube in the stomach with the tip near the antrum. Dilated small bowel loops similar to the prior study compatible with small bowel obstruction. Colon decompressed. Calcified uterine fibroid in the right pelvis. IMPRESSION: NG tube in the gastric antrum.  Small bowel obstruction unchanged. Electronically Signed   By: Marlan Palau M.D.   On: 12/22/2021 11:29    Anti-infectives: Anti-infectives (From admission, onward)    None        Assessment/Plan SBO -will repeat SBO protocol today -cont NGT -mobilize patient as she is able -hopefully can resolve without needing OR   FEN - NPO/NGT/IVFs VTE - lovenox ID - none currently needed  HTN CVA Wheelchair bound Hypercoagulable state H/O DVT  I reviewed hospitalist notes, last 24 h vitals and pain scores, last 48 h intake and output, last 24 h labs and trends, and last 24 h imaging results.   LOS: 4 days    Letha Cape , [facility] 12/24/2021, 9:58 AM Please see Amion for pager number during day hours 7:00am-4:30pm or 7:00am -11:30am on weekends

## 2021-12-25 ENCOUNTER — Inpatient Hospital Stay (HOSPITAL_COMMUNITY): Payer: Medicare Other

## 2021-12-25 DIAGNOSIS — K56609 Unspecified intestinal obstruction, unspecified as to partial versus complete obstruction: Secondary | ICD-10-CM | POA: Diagnosis not present

## 2021-12-25 DIAGNOSIS — E039 Hypothyroidism, unspecified: Secondary | ICD-10-CM | POA: Diagnosis not present

## 2021-12-25 DIAGNOSIS — L89154 Pressure ulcer of sacral region, stage 4: Secondary | ICD-10-CM | POA: Diagnosis not present

## 2021-12-25 DIAGNOSIS — I10 Essential (primary) hypertension: Secondary | ICD-10-CM | POA: Diagnosis not present

## 2021-12-25 LAB — CBC WITH DIFFERENTIAL/PLATELET
Abs Immature Granulocytes: 0.09 10*3/uL — ABNORMAL HIGH (ref 0.00–0.07)
Basophils Absolute: 0.1 10*3/uL (ref 0.0–0.1)
Basophils Relative: 1 %
Eosinophils Absolute: 0.1 10*3/uL (ref 0.0–0.5)
Eosinophils Relative: 2 %
HCT: 29.6 % — ABNORMAL LOW (ref 36.0–46.0)
Hemoglobin: 10.1 g/dL — ABNORMAL LOW (ref 12.0–15.0)
Immature Granulocytes: 1 %
Lymphocytes Relative: 19 %
Lymphs Abs: 1.2 10*3/uL (ref 0.7–4.0)
MCH: 26.3 pg (ref 26.0–34.0)
MCHC: 34.1 g/dL (ref 30.0–36.0)
MCV: 77.1 fL — ABNORMAL LOW (ref 80.0–100.0)
Monocytes Absolute: 0.8 10*3/uL (ref 0.1–1.0)
Monocytes Relative: 11 %
Neutro Abs: 4.4 10*3/uL (ref 1.7–7.7)
Neutrophils Relative %: 66 %
Platelets: 257 10*3/uL (ref 150–400)
RBC: 3.84 MIL/uL — ABNORMAL LOW (ref 3.87–5.11)
RDW: 18.3 % — ABNORMAL HIGH (ref 11.5–15.5)
WBC: 6.7 10*3/uL (ref 4.0–10.5)
nRBC: 0 % (ref 0.0–0.2)

## 2021-12-25 LAB — BASIC METABOLIC PANEL
Anion gap: 6 (ref 5–15)
BUN: 16 mg/dL (ref 8–23)
CO2: 28 mmol/L (ref 22–32)
Calcium: 8.2 mg/dL — ABNORMAL LOW (ref 8.9–10.3)
Chloride: 108 mmol/L (ref 98–111)
Creatinine, Ser: 0.77 mg/dL (ref 0.44–1.00)
GFR, Estimated: >60 mL/min (ref >60)
Glucose, Bld: 162 mg/dL — ABNORMAL HIGH (ref 70–99)
Potassium: 3.3 mmol/L — ABNORMAL LOW (ref 3.5–5.1)
Sodium: 142 mmol/L (ref 135–145)

## 2021-12-25 LAB — GLUCOSE, CAPILLARY: Glucose-Capillary: 110 mg/dL — ABNORMAL HIGH (ref 70–99)

## 2021-12-25 MED ORDER — POTASSIUM CHLORIDE 10 MEQ/100ML IV SOLN
10.0000 meq | INTRAVENOUS | Status: AC
  Administered 2021-12-25 (×4): 10 meq via INTRAVENOUS
  Filled 2021-12-25 (×4): qty 100

## 2021-12-25 MED ORDER — HEPARIN (PORCINE) 25000 UT/250ML-% IV SOLN
1050.0000 [IU]/h | INTRAVENOUS | Status: AC
  Administered 2021-12-25: 1050 [IU]/h via INTRAVENOUS
  Filled 2021-12-25: qty 250

## 2021-12-25 NOTE — Progress Notes (Signed)
Pharmacy consulted for Heparin drip, got a verbal confirmation to infuse Heparin just until midnight.We will continue to monitor.

## 2021-12-26 ENCOUNTER — Encounter (HOSPITAL_COMMUNITY): Payer: Self-pay | Admitting: Internal Medicine

## 2021-12-26 ENCOUNTER — Inpatient Hospital Stay (HOSPITAL_COMMUNITY): Payer: Medicare Other

## 2021-12-26 ENCOUNTER — Inpatient Hospital Stay: Payer: Self-pay

## 2021-12-26 DIAGNOSIS — K56609 Unspecified intestinal obstruction, unspecified as to partial versus complete obstruction: Secondary | ICD-10-CM | POA: Diagnosis not present

## 2021-12-26 DIAGNOSIS — R9431 Abnormal electrocardiogram [ECG] [EKG]: Secondary | ICD-10-CM

## 2021-12-26 DIAGNOSIS — M7989 Other specified soft tissue disorders: Secondary | ICD-10-CM | POA: Diagnosis not present

## 2021-12-26 DIAGNOSIS — L89153 Pressure ulcer of sacral region, stage 3: Secondary | ICD-10-CM

## 2021-12-26 DIAGNOSIS — N179 Acute kidney failure, unspecified: Secondary | ICD-10-CM

## 2021-12-26 HISTORY — DX: Acute kidney failure, unspecified: N17.9

## 2021-12-26 LAB — CBC WITH DIFFERENTIAL/PLATELET
Abs Immature Granulocytes: 0.08 10*3/uL — ABNORMAL HIGH (ref 0.00–0.07)
Basophils Absolute: 0 10*3/uL (ref 0.0–0.1)
Basophils Relative: 1 %
Eosinophils Absolute: 0.1 10*3/uL (ref 0.0–0.5)
Eosinophils Relative: 2 %
HCT: 30 % — ABNORMAL LOW (ref 36.0–46.0)
Hemoglobin: 10.2 g/dL — ABNORMAL LOW (ref 12.0–15.0)
Immature Granulocytes: 1 %
Lymphocytes Relative: 18 %
Lymphs Abs: 1.2 10*3/uL (ref 0.7–4.0)
MCH: 26.4 pg (ref 26.0–34.0)
MCHC: 34 g/dL (ref 30.0–36.0)
MCV: 77.7 fL — ABNORMAL LOW (ref 80.0–100.0)
Monocytes Absolute: 0.6 10*3/uL (ref 0.1–1.0)
Monocytes Relative: 9 %
Neutro Abs: 4.6 10*3/uL (ref 1.7–7.7)
Neutrophils Relative %: 69 %
Platelets: 248 10*3/uL (ref 150–400)
RBC: 3.86 MIL/uL — ABNORMAL LOW (ref 3.87–5.11)
RDW: 18.6 % — ABNORMAL HIGH (ref 11.5–15.5)
WBC: 6.6 10*3/uL (ref 4.0–10.5)
nRBC: 0 % (ref 0.0–0.2)

## 2021-12-26 LAB — ECHOCARDIOGRAM COMPLETE
AR max vel: 2.08 cm2
AV Area VTI: 2.26 cm2
AV Area mean vel: 2.1 cm2
AV Mean grad: 4 mmHg
AV Peak grad: 6.3 mmHg
Ao pk vel: 1.25 m/s
Area-P 1/2: 5.84 cm2
Height: 66 in
S' Lateral: 3.3 cm
Weight: 2836 oz

## 2021-12-26 LAB — BASIC METABOLIC PANEL
Anion gap: 6 (ref 5–15)
BUN: 13 mg/dL (ref 8–23)
CO2: 26 mmol/L (ref 22–32)
Calcium: 7.9 mg/dL — ABNORMAL LOW (ref 8.9–10.3)
Chloride: 106 mmol/L (ref 98–111)
Creatinine, Ser: 0.63 mg/dL (ref 0.44–1.00)
GFR, Estimated: >60 mL/min (ref >60)
Glucose, Bld: 112 mg/dL — ABNORMAL HIGH (ref 70–99)
Potassium: 3.9 mmol/L (ref 3.5–5.1)
Sodium: 138 mmol/L (ref 135–145)

## 2021-12-26 LAB — CULTURE, BLOOD (ROUTINE X 2)
Culture: NO GROWTH
Culture: NO GROWTH
Special Requests: ADEQUATE
Special Requests: ADEQUATE

## 2021-12-26 LAB — TROPONIN I (HIGH SENSITIVITY): Troponin I (High Sensitivity): 12 ng/L (ref <18)

## 2021-12-26 MED ORDER — AZELASTINE HCL 0.1 % NA SOLN
2.0000 | Freq: Two times a day (BID) | NASAL | Status: DC | PRN
  Filled 2021-12-26: qty 30

## 2021-12-26 MED ORDER — SALINE SPRAY 0.65 % NA SOLN
2.0000 | Freq: Three times a day (TID) | NASAL | Status: DC | PRN
Start: 2021-12-26 — End: 2022-01-10
  Filled 2021-12-26: qty 44

## 2021-12-26 MED ORDER — METOPROLOL TARTRATE 5 MG/5ML IV SOLN
2.5000 mg | Freq: Four times a day (QID) | INTRAVENOUS | Status: DC
  Administered 2021-12-26: 2.5 mg via INTRAVENOUS
  Filled 2021-12-26: qty 5

## 2021-12-26 MED ORDER — HEPARIN (PORCINE) 25000 UT/250ML-% IV SOLN
1100.0000 [IU]/h | INTRAVENOUS | Status: DC
  Administered 2021-12-26: 1100 [IU]/h via INTRAVENOUS
  Filled 2021-12-26: qty 250

## 2021-12-26 MED ORDER — FLUTICASONE PROPIONATE 50 MCG/ACT NA SUSP
1.0000 | Freq: Two times a day (BID) | NASAL | Status: DC
  Administered 2021-12-26 – 2022-01-10 (×18): 1 via NASAL
  Filled 2021-12-26: qty 16

## 2021-12-26 MED ORDER — GLYCERIN-HYPROMELLOSE-PEG 400 0.2-0.2-1 % OP SOLN: 1.0000 [drp] | OPHTHALMIC | Status: DC | PRN

## 2021-12-26 MED ORDER — HYDROCORTISONE 1 % EX LOTN
TOPICAL_LOTION | Freq: Two times a day (BID) | CUTANEOUS | Status: DC | PRN
  Filled 2021-12-26 (×3): qty 118

## 2021-12-26 NOTE — Progress Notes (Signed)
PT Cancellation Note  Patient Details Name: Katrina Donaldson MRN: [id number] DOB: May 16, 1956   Cancelled Treatment:    Reason Eval/Treat Not Completed: PT screened, no needs identified, will sign off, patient is from LTC. [place] Office [phone] Weekend [phone]   [name] 12/26/2021, 9:33 AM

## 2021-12-26 NOTE — Progress Notes (Signed)
At bedside to obtain consent for PICC and place.  Patient declines to have PICC placed tonight and states she wants to do it "tomorrow after a good night's sleep".  Patient made aware that she will need to have a second site started tonight for IV medications that are incompatible.  When asked again if she would allow for PICC to be placed tonight she states that she would still like to wait until tomorrow.

## 2021-12-26 NOTE — Progress Notes (Signed)
Bilateral lower extremity venous duplex has been completed. Preliminary results can be found in CV Proc through chart review.  Results were given to Dr. Maren Beach.  12/26/21 1:20 PM Carlos Levering RVT

## 2021-12-26 NOTE — Progress Notes (Signed)
ANTICOAGULATION CONSULT NOTE - Initial Consult  Pharmacy Consult for heparin Indication: history of DVT on rivaroxaban PTA, hypercoagulable state  Allergies  Allergen Reactions   Shellfish-Derived Products Other (See Comments)    ALLERGIC TO SHRIMP!! Made the tongue "feel strange" Per patient, she has since eaten crab legs and did not have another reaction.     Patient Measurements: Height: '5\' 6"'$  (167.6 cm) Weight: 80.4 kg (177 lb 4 oz) IBW/kg (Calculated) : 59.3 Heparin Dosing Weight: 76 kg  Vital Signs: Temp: 98.3 F (36.8 C) (06/28 0947) Temp Source: Oral (06/28 0947) BP: 154/96 (06/28 0947) Pulse Rate: 110 (06/28 0947)  Labs: Recent Labs    12/24/21 0228 12/25/21 0439 12/26/21 0458  HGB 10.1* 10.1* 10.2*  HCT 30.0* 29.6* 30.0*  PLT 253 257 248  CREATININE 0.74 0.77 0.63    Estimated Creatinine Clearance: 71.9 mL/min (by C-G formula based on SCr of 0.63 mg/dL).   Medical History: Past Medical History:  Diagnosis Date   AKI (acute kidney injury) ([place]) 12/26/2021   Arachnoid cyst    Brain aneurysm    Chronic anticoagulation 05/26/2017   Depression    History of DVT (deep vein thrombosis)    Hypercoagulable state ([place])    Hypertension    Hypothyroidism    Stroke ([place]) 2016   [facility] 11/29/14   Wheelchair bound     Medications: Rivaroxaban 20 mg PO Daily PTA -Last dose: 6/21  Assessment: Pt is a 37 yoF admitted with SBO and initially treated with conservative management. PMH significant for DVT in 2018, hypercoagulable state on Xarelto PTA.   Xarelto held on admission, was on enoxaparin for DVT ppx from 6/22 > 6/27. Heparin drip started on 6/27 but turned off @ 0020 this morning for possible surgery.   Per discussion with [facility] and TRH, ok to resume heparin drip at this time. Surgery likely will take place on 6/29, verbal order to turn off heparin at midnight tonight.   Today, 12/26/21 CBC: Hgb slightly low but stable; Plt WNL No need to check baseline HL  since >72 hours since last dose of Xarelto SCr WNL and stable   Goal of Therapy:  Heparin level 0.3-0.7 units/ml Monitor platelets by anticoagulation protocol: Yes   Plan:  Resume heparin infusion at 1100 units/hr Check 6 hour HL HL, CBC daily Monitor for signs of bleeding Heparin drip to stop @ 0000 on 6/29 prior to surgery  Lenis Noon, PharmD 12/26/2021,12:12 PM

## 2021-12-26 NOTE — Progress Notes (Signed)
OT Cancellation Note  Patient Details Name: Katrina Donaldson MRN: [id number] DOB: [birth year]   Cancelled Treatment:    Reason Eval/Treat Not Completed: OT screened, no needs identified, will sign off Patient is LTC at [facility] and bed bound per chart and case manager report. Patient plans to transition back to SNF at time of d/c from hosptial. No acute OT needs at this time.  Jackelyn Poling OTR/L, [facility] Office# [phone] Pager# [phone]  12/26/2021, 10:02 AM

## 2021-12-26 NOTE — Progress Notes (Signed)
Subjective: CC: Feels about the same. Holding a wet rag up to her mouth because it feels dry and the ngt is irritating her. She says her abdomen isn't hurting her but feels distended. No flatus or bm.   Objective: Vital signs in last 24 hours: Temp:  [97.9 F (36.6 C)-98.4 F (36.9 C)] 98.2 F (36.8 C) (06/28 0610) Pulse Rate:  [110-121] 110 (06/28 0610) Resp:  [16-19] 17 (06/28 0610) BP: (116-127)/(78-86) 126/86 (06/28 0610) SpO2:  [95 %-100 %] 100 % (06/28 0610) Last BM Date : 12/20/21  Intake/Output from previous day: 06/27 0701 - 06/28 0700 In: 2644.1 [I.V.:2360.5; IV Piggyback:283.6] Out: 2350 [Urine:700; Emesis/NG output:1650] Intake/Output this shift: No intake/output data recorded.  PE: Gen:  Alert, NAD, pleasant Abd: Protuberant with some distension, some epigastric ttp, hypoactive bowel sounds but some heard. NGT on LIWS with bilious output, 1.65 L/24 hours.  Lab Results:  Recent Labs    12/25/21 0439 12/26/21 0458  WBC 6.7 6.6  HGB 10.1* 10.2*  HCT 29.6* 30.0*  PLT 257 248   BMET Recent Labs    12/25/21 0439 12/26/21 0458  NA 142 138  K 3.3* 3.9  CL 108 106  CO2 28 26  GLUCOSE 162* 112*  BUN 16 13  CREATININE 0.77 0.63  CALCIUM 8.2* 7.9*   PT/INR No results for input(s): "LABPROT", "INR" in the last 72 hours. CMP     Component Value Date/Time   NA 138 12/26/2021 0458   NA 133 (A) 12/14/2014 0000   K 3.9 12/26/2021 0458   CL 106 12/26/2021 0458   CO2 26 12/26/2021 0458   GLUCOSE 112 (H) 12/26/2021 0458   BUN 13 12/26/2021 0458   BUN 8 12/14/2014 0000   CREATININE 0.63 12/26/2021 0458   CREATININE 0.56 06/21/2021 1106   CALCIUM 7.9 (L) 12/26/2021 0458   PROT 8.1 12/21/2021 0507   ALBUMIN 3.0 (L) 12/21/2021 0507   AST 14 (L) 12/21/2021 0507   ALT 11 12/21/2021 0507   ALKPHOS 82 12/21/2021 0507   BILITOT 0.9 12/21/2021 0507   GFRNONAA >60 12/26/2021 0458   GFRAA >60 06/04/2017 0230   Lipase     Component Value Date/Time    LIPASE 44 12/20/2021 1306    Studies/Results: DG Abd Portable 1V  Result Date: 12/26/2021 CLINICAL DATA:  Small-bowel obstruction EXAM: PORTABLE ABDOMEN - 1 VIEW COMPARISON:  12/25/2021 FINDINGS: Persistent dilated loops of small bowel particularly in the left abdomen. Maximum measurable distension is decreased. Calcified uterine fibroid is again noted. IMPRESSION: Persistent small bowel obstruction. Maximum distension measures decreased. Electronically Signed   By: Macy Mis M.D.   On: 12/26/2021 08:24   DG Abd Portable 1V  Result Date: 12/25/2021 CLINICAL DATA:  Small bowel obstruction. EXAM: PORTABLE ABDOMEN - 1 VIEW COMPARISON:  Abdominal x-ray from yesterday. FINDINGS: Enteric tube incompletely visualized looped in the left chest, presumably within the large hiatal hernia as seen on CT. Multiple dilated loops of small bowel are unchanged. Calcified uterine fibroid again noted. No acute osseous abnormality. IMPRESSION: 1. Unchanged small bowel obstruction. Electronically Signed   By: Titus Dubin M.D.   On: 12/25/2021 10:31   DG Abd 1 View  Result Date: 12/24/2021 CLINICAL DATA:  None hour delayed film.  Small bowel obstruction. EXAM: ABDOMEN - 1 VIEW COMPARISON:  12/23/2021 FINDINGS: Continued small bowel dilatation compatible with small bowel obstruction. Likely not significantly changed. No visible free air. Large calcified fibroid in the right side of the  pelvis. NG tube is not visualized. This likely projects off the superior aspect of the image as the upper abdomen is not included on this single image. IMPRESSION: Continued small bowel obstruction pattern, not significantly changed. Electronically Signed   By: Rolm Baptise M.D.   On: 12/24/2021 23:14    Anti-infectives: Anti-infectives (From admission, onward)    None        Assessment/Plan SBO - Xray this am with persistent sbo like changes. She has now been here for 6d with little progress. I think she will need  exploratory surgery to relieve this as I do not think she will improve with continued conservative management. Will discuss with MD timing of OR. Continue to hold heparin gtt for now. Will consider TPN today if not going to OR. Cont NPO, NGT to LIWS. We will follow. Further recs to follow.    FEN - NPO. NGT to LIWS. IVFs per TRH.  VTE - SCDs, hep gtt on hold ID - None indicated from our standpoint. Febrile 6/23. Afebrile overnight. HR 110's. BP ok   - per TRH -  Tachycardia  HTN Hx CVA Wheelchair bound Hypercoagulable state H/O DVT on Xarelto (held) Hypothyroidism HLD IDA GERD   I reviewed hospitalist notes, last 24 h vitals and pain scores, last 48 h intake and output, last 24 h labs and trends, and last 24 h imaging results.     LOS: 6 days    Jillyn Ledger , [facility] 12/26/2021, 8:34 AM Please see Amion for pager number during day hours 7:00am-4:30pm

## 2021-12-26 NOTE — Progress Notes (Signed)
Pharmacy Brief Note - TPN:  Pharmacy consulted to dose TPN for bowel obstruction.   Patient only has peripheral IV access, order has been placed for PICC. TPN consult received after the noon deadline, TPN will begin on 6/29 @ 1800 per protocol pending PICC placement.   Will place consult to RD and order labs for tomorrow morning.   Lenis Noon, PharmD 12/26/21 12:28 PM

## 2021-12-26 NOTE — Hospital Course (Addendum)
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 In the ED, vital signs noted for tachycardia otherwise fairly stable, labs noted for mild anemia, otherwise stable.  CT abdomen/pelvis showed high-grade SBO likely due to adhesions, large hiatal hernia.  Surgery consulted and patient admitted for further management  Patient has failed to progress despite having NG tube decompression IV fluid hydration. She was noted to have tachycardia abnormal EKG, no chest pain troponins are negative, EKG shows sinus tachycardia. Echocardiogram showed decreased EF 30-35%. Labs with improved/stable renal function, stable hemoglobin at 10.2 g.  Seen by cardiology.  Sbo did not improve subsequently underwent ex laparotomy with lysis of adhesion by Dr Barry Dienes 6/30.patient continued on NG decompression and clamping trial-ng fell off 7/4-keeping it off.  Awaiting return of bowel function

## 2021-12-26 NOTE — Progress Notes (Signed)
Echocardiogram 2D Echocardiogram has been performed.  Joette Catching 12/26/2021, 3:46 PM

## 2021-12-26 NOTE — Progress Notes (Addendum)
PROGRESS NOTE Katrina Donaldson  KKX:[id number] DOB: September 13, 1955 DOA: 12/20/2021 PCP: Donald Prose, MD   Brief Narrative/Hospital Course: 66 y.o.f w/ history significant of laparoscopic partial colectomy in 01/2021 2/2 cecal mass, thyroidectomy, brain aneurysm, chronic anticoagulation, hypertension, hypothyroidism, CVA, sacral osteomyelitis, wheelchair-bound presented from SNF complaining of abdominal pain, nausea, vomiting, poor appetite  x 5 days, last BM 5 days PTA. In the ED, vital signs noted for tachycardia otherwise fairly stable, labs noted for mild anemia, otherwise stable.  CT abdomen/pelvis showed high-grade SBO likely due to adhesions, large hiatal hernia.  Surgery consulted and patient admitted for further management   6/28-overnight afebrile, labs with improved/stable renal function, stable hemoglobin at 10.2 g, No BM charted, NG output 1.6 L, xray abd- 6/27-unchanged SBO, x-ray pending 6/28-still with SBO    Subjective: Seen and examined this morning.  No nausea vomiting or abdominal pain, no BM or flatus. Reports she has been doing to have a discussion about surgery.  Assessment and Plan: Principal Problem:   SBO (small bowel obstruction) (HCC) Active Problems:   Essential hypertension   Anemia, iron deficiency   Hypothyroidism   Hypercoagulable state ([place])   Chronic anticoagulation   Cecum mass   Sacral osteomyelitis (HCC)   AKI (acute kidney injury) ([place])   Pressure ulcer of sacral region, stage 3 (HCC)    SBO in the setting of previous laparoscopic partial colectomy for cecal mass: X-ray with persistent SBO General surgery following likely need operative intervention.  Continue IV fluids with dextrose, continue IV antiemetics pain medication.  Await further surgical discussion W/ patient  Episode of fever resolved . UA unremarkable for UTI blood cultures x2 NGTD, chest x-ray showed possible atelectasis.  Monitor off antibiotics.  Acute urine  retention-resolved  Hypernatremia w/ Hyperchloremia: Sodium chloride normalized.  On D5/0.45NS  Essential hypertension Sinus tachycardia: BP controlled, w/ tachycardia-likely contributed by not getting admitted and 25 twice daily, start Lopressor 2.5 every 6 hours can titrate to 5 mg, repeat EKG today-subtle inferolateral ST-T wave changes similar to 6/27- no repoRt of Chest per per nursing-will check troponin, will request cardiology consult -sent message to [place], ordered TTE.  Anemia, iron deficiency/normocytic anemia/anemia of chronic disease: Chronic.  Stable in 10 g range.  Monitor.  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  Xarelto Was held on admission-heparin was held 6/27 for possible OR- but discussed with surgery PA no plan for surgery today so okay to resume heparin as she is at significant risk for recurrent DVT in the setting of current bowel obstruction decreased mobility  AKI: Mild, resolved.  Monitor and continue hydration GERD continue IV PPI HLD holding statin Stage III pressure ulcer on sacrum POA: Continue wound care and offloading Pressure Injury 12/20/21 Sacrum Posterior Stage 3 -  Full thickness tissue loss. Subcutaneous fat may be visible but bone, tendon or muscle are NOT exposed. Red (Active)  12/20/21 1950  Location:[place]  Location Orientation:Posterior  Staging:Stage 3-Full thickness tissue loss. Subcutaneous fat may be visible but bone, tendon or muscle are NOT exposed.  Wound Description (Comments):Red  Present on Admission:Yes  Dressing Type Foam:Lift dressing to assess site every shift. 12/26/21 1008   DVT prophylaxis: SCDs Start: 12/20/21 1811 Code Status:   Code Status: Full Code Family Communication: plan of care discussed with patient at bedside. I called to update daughter Shirlee Limerick and Son Mr Games but no answer, and then proceeded to call sister and updated  her- all questions were answered. Patient status is: INPATIENT because of FOR SBO Level of care: Telemetry   Dispo: The patient is from: HOME            Anticipated disposition: TBD PENDING SBO resolution- likely needs surgery  Mobility Assessment (last 72 hours)     Mobility Assessment     Row Name 12/26/21 1008 12/25/21 2124 12/25/21 1003 12/24/21 1935 12/24/21 1000   Does patient have an order for bedrest or is patient medically unstable -- Yes- Bedfast (Level 1) - Complete Yes- Bedfast (Level 1) - Complete Yes- Bedfast (Level 1) - Complete Yes- Bedfast (Level 1) - Complete   What is the highest level of mobility based on the progressive mobility assessment? Level 1 (Bedfast) - Unable to balance while sitting on edge of bed Level 1 (Bedfast) - Unable to balance while sitting on edge of bed Level 1 (Bedfast) - Unable to balance while sitting on edge of bed Level 1 (Bedfast) - Unable to balance while sitting on edge of bed Level 1 (Bedfast) - Unable to balance while sitting on edge of bed   Is the above level different from baseline mobility prior to current illness? No - Consider discontinuing PT/OT No - Consider discontinuing PT/OT No - Consider discontinuing PT/OT No - Consider discontinuing PT/OT No - Consider discontinuing PT/OT    Row Name 12/24/21 0943 12/23/21 2201         Does patient have an order for bedrest or is patient medically unstable Yes- Bedfast (Level 1) - Complete Yes- Bedfast (Level 1) - Complete      What is the highest level of mobility based on the progressive mobility assessment? Level 2 ([place]) - Balance while sitting on edge of bed and cannot stand Level 1 (Bedfast) - Unable to balance while sitting on edge of bed      Is the above level different from baseline mobility prior to current illness? No - Consider discontinuing PT/OT No - Consider discontinuing PT/OT              Objective: Vitals last 24 hrs: Vitals:   12/26/21 0206 12/26/21 0610 12/26/21  0947 12/26/21 1328  BP: 127/86 126/86 (!) 154/96 125/82  Pulse: (!) 113 (!) 110 (!) 110 (!) 107  Resp: '17 17 18 18  '$ Temp: 98.3 F (36.8 C) 98.2 F (36.8 C) 98.3 F (36.8 C) 98.9 F (37.2 C)  TempSrc: Oral Oral Oral Oral  SpO2: 100% 100% 97% 97%  Weight:      Height:       Weight change:   Physical Examination: General exam: alert awake,older than stated age, weak appearing. HEENT:Oral mucosa moist, Ear/Nose WNL grossly, dentition normal. Respiratory system: bilaterally diminished BS, no use of accessory muscle Cardiovascular system: S1 & S2 +, No JVD. Gastrointestinal system:  NGT+,Abdomen soft,NT,ND, BS+ Nervous System:Alert, awake, moving extremities and grossly nonfocal Extremities: LE edema neg,distal peripheral pulses palpable.  Skin: No rashes,no icterus. MSK: Normal muscle bulk,tone, power  Medications reviewed:  Scheduled Meds:  diatrizoate meglumine-sodium  90 mL Per NG tube Once   fluticasone  1 spray Each Nare BID   levothyroxine  100 mcg Intravenous Daily   metoprolol tartrate  2.5 mg Intravenous Q6H   pantoprazole (PROTONIX) IV  40 mg Intravenous Q12H   Continuous Infusions:  dextrose 5 % and 0.45% NaCl 100 mL/hr at 12/26/21 0352   heparin      Diet Order  Diet NPO time specified Except for: Ice Chips  Diet effective now                    Nutrition Problem: Inadequate oral intake Etiology: acute illness, nausea, vomiting Signs/Symptoms: NPO status Interventions: Refer to RD note for recommendations   Intake/Output Summary (Last 24 hours) at 12/26/2021 1357 Last data filed at 12/26/2021 1000 Gross per 24 hour  Intake 2244.17 ml  Output 1500 ml  Net 744.17 ml   Net IO Since Admission: 249.04 mL [12/26/21 1357]  Wt Readings from Last 3 Encounters:  12/23/21 80.4 kg  09/11/21 77.1 kg  01/31/21 89.1 kg     Unresulted Labs (From admission, onward)     Start     Ordered   12/27/21 0500  Comprehensive metabolic panel  ([facility] Lab  Panel)  Tomorrow morning,   R        12/26/21 1229   12/27/21 0500  Magnesium  ([facility] Lab Panel)  Tomorrow morning,   R        12/26/21 1229   12/27/21 0500  Phosphorus  ([facility] Lab Panel)  Tomorrow morning,   R        12/26/21 1229   12/27/21 0500  Triglycerides  ([facility] Lab Panel)  Tomorrow morning,   R        12/26/21 1229   12/27/21 0500  Comprehensive metabolic panel  ([facility] Lab Panel)  Every Mon,Thu (0500),   R      12/26/21 1229   12/27/21 0500  Magnesium  ([facility] Lab Panel)  Every Mon,Thu (0500),   R      12/26/21 1229   12/27/21 0500  Phosphorus  ([facility] Lab Panel)  Every Mon,Thu (0500),   R      12/26/21 1229   12/27/21 0500  Triglycerides  ([facility] Lab Panel)  Every Mon,Thu (0500),   R      12/26/21 1229   12/22/21 0500  CBC with Differential/Platelet  Daily,   R      12/21/21 1543          Data Reviewed: I have personally reviewed following labs and imaging studies CBC: Recent Labs  Lab 12/22/21 0515 12/23/21 0453 12/24/21 0228 12/25/21 0439 12/26/21 0458  WBC 2.8* 4.0 4.2 6.7 6.6  NEUTROABS 1.7 3.4 2.5 4.4 4.6  HGB 10.4* 10.6* 10.1* 10.1* 10.2*  HCT 32.6* 32.4* 30.0* 29.6* 30.0*  MCV 81.9 81.0 77.9* 77.1* 77.7*  PLT 252 241 253 257 101   Basic Metabolic Panel: Recent Labs  Lab 12/22/21 0515 12/23/21 0453 12/24/21 0228 12/25/21 0439 12/26/21 0458  NA 145 150* 145 142 138  K 3.4* 3.7 3.0* 3.3* 3.9  CL 111 117* 111 108 106  CO2 19* 21* '27 28 26  '$ GLUCOSE 84 96 130* 162* 112*  BUN 33* '23 19 16 13  '$ CREATININE 1.03* 0.91 0.74 0.77 0.63  CALCIUM 8.2* 8.7* 8.7* 8.2* 7.9*  MG 2.2  --  2.2  --   --    GFR: Estimated Creatinine Clearance: 71.9 mL/min (by C-G formula based on SCr of 0.63 mg/dL). Liver Function Tests: Recent Labs  Lab 12/20/21 1306 12/21/21 0507  AST 15 14*  ALT 11 11  ALKPHOS 88 82  BILITOT 0.6 0.9  PROT 8.5* 8.1  ALBUMIN 3.1* 3.0*   Recent Labs  Lab 12/20/21 1306  LIPASE 44   No results for input(s): "AMMONIA" in the last 168  hours. Coagulation Profile: Recent Labs  Lab 12/21/21  0507  INR 1.7*   BNP (last 3 results) No results for input(s): "PROBNP" in the last 8760 hours. HbA1C: No results for input(s): "HGBA1C" in the last 72 hours. CBG: Recent Labs  Lab 12/25/21 2109  GLUCAP 110*   Lipid Profile: No results for input(s): "CHOL", "HDL", "LDLCALC", "TRIG", "CHOLHDL", "LDLDIRECT" in the last 72 hours. Thyroid Function Tests: No results for input(s): "TSH", "T4TOTAL", "FREET4", "T3FREE", "THYROIDAB" in the last 72 hours. Sepsis Labs: Recent Labs  Lab 12/20/21 1215 12/20/21 1911  LATICACIDVEN 1.2 1.5    Recent Results (from the past 240 hour(s))  Resp Panel by RT-PCR (Flu A&B, Covid) Anterior Nasal Swab     Status: None   Collection Time: 12/20/21 12:40 PM   Specimen: Anterior Nasal Swab  Result Value Ref Range Status   SARS Coronavirus 2 by RT PCR NEGATIVE NEGATIVE Final    Comment: (NOTE) SARS-CoV-2 target nucleic acids are NOT DETECTED.  The SARS-CoV-2 RNA is generally detectable in upper respiratory specimens during the acute phase of infection. The lowest concentration of SARS-CoV-2 viral copies this assay can detect is 138 copies/mL. A negative result does not preclude SARS-Cov-2 infection and should not be used as the sole basis for treatment or other patient management decisions. A negative result may occur with  improper specimen collection/handling, submission of specimen other than nasopharyngeal swab, presence of viral mutation(s) within the areas targeted by this assay, and inadequate number of viral copies(<138 copies/mL). A negative result must be combined with clinical observations, patient history, and epidemiological information. The expected result is Negative.  Fact Sheet for Patients:  EntrepreneurPulse.com.au  Fact Sheet for Healthcare Providers:  IncredibleEmployment.be  This test is no t yet approved or cleared by the [place] FDA and  has been authorized for detection and/or diagnosis of SARS-CoV-2 by FDA under an Emergency Use Authorization (EUA). This EUA will remain  in effect (meaning this test can be used) for the duration of the COVID-19 declaration under Section 564(b)(1) of the Act, 21 U.S.C.section 360bbb-3(b)(1), unless the authorization is terminated  or revoked sooner.       Influenza A by PCR NEGATIVE NEGATIVE Final   Influenza B by PCR NEGATIVE NEGATIVE Final    Comment: (NOTE) The Xpert Xpress SARS-CoV-2/FLU/RSV plus assay is intended as an aid in the diagnosis of influenza from Nasopharyngeal swab specimens and should not be used as a sole basis for treatment. Nasal washings and aspirates are unacceptable for Xpert Xpress SARS-CoV-2/FLU/RSV testing.  Fact Sheet for Patients: EntrepreneurPulse.com.au  Fact Sheet for Healthcare Providers: IncredibleEmployment.be  This test is not yet approved or cleared by the [place] FDA and has been authorized for detection and/or diagnosis of SARS-CoV-2 by FDA under an Emergency Use Authorization (EUA). This EUA will remain in effect (meaning this test can be used) for the duration of the COVID-19 declaration under Section 564(b)(1) of the Act, 21 U.S.C. section 360bbb-3(b)(1), unless the authorization is terminated or revoked.  Performed at [facility], [place] [address] [place], [place] [address]   Culture, blood (Routine X 2) w Reflex to ID Panel     Status: None   Collection Time: 12/21/21  7:54 AM   Specimen: BLOOD  Result Value Ref Range Status   Specimen Description   Final    BLOOD LEFT ANTECUBITAL Performed at [facility] [address], [place], [place] [address]    Special Requests   Final    BOTTLES DRAWN AEROBIC AND ANAEROBIC Blood  Culture adequate volume Performed at [facility] [address] [place], [place] [address]     Culture   Final    NO GROWTH 5 DAYS Performed at [facility], [place] [address] [place], [place] [address]    Report Status 12/26/2021 FINAL  Final  Culture, blood (Routine X 2) w Reflex to ID Panel     Status: None   Collection Time: 12/21/21  8:00 AM   Specimen: BLOOD RIGHT HAND  Result Value Ref Range Status   Specimen Description   Final    BLOOD RIGHT HAND Performed at [facility] [address], [place], [place] [address]    Special Requests   Final    BOTTLES DRAWN AEROBIC ONLY Blood Culture adequate volume Performed at [facility] [address] [place], [place] [address]    Culture   Final    NO GROWTH 5 DAYS Performed at [facility], [place] [address], [place], [place] [address]    Report Status 12/26/2021 FINAL  Final    Antimicrobials: Anti-infectives (From admission, onward)    None      Culture/Microbiology    Component Value Date/Time   SDES  12/21/2021 0800    BLOOD RIGHT HAND Performed at [facility], [place] [address], [place], [place] [address]    SPECREQUEST  12/21/2021 0800    BOTTLES DRAWN AEROBIC ONLY Blood Culture adequate volume Performed at [place] [facility] [address] [place], [place] [address]    CULT  12/21/2021 0800    NO GROWTH 5 DAYS Performed at [facility], [place] [address], [place],  [address]    REPTSTATUS 12/26/2021 FINAL 12/21/2021 0800    Other culture-see note  Radiology Studies: VAS [place] LOWER EXTREMITY VENOUS (DVT)  Result Date: 12/26/2021  Lower Venous DVT Study Patient Name:  MALVINA SCHADLER  Date of Exam:   12/26/2021 Medical Rec #: [id number]        Accession #:    [id number] Date of Birth: [birth year]        Patient Gender: F Patient Age:   39 years Exam Location:  [facility] Procedure:      VAS [place] LOWER EXTREMITY VENOUS (DVT) Referring Phys: [place]  --------------------------------------------------------------------------------  Indications: Swelling.  Risk Factors: DVT. Limitations: Poor ultrasound/tissue interface and patient positioning, patient immobility, patient pain tolerance. Comparison Study: 07/25/2016 - - Findings consistent with acute deep vein                   thrombosis involving the                   right common femoral vein, right profunda femoris vein, right                   femoral vein, right popliteal vein, right posterial tibial                   vein,                   and right peroneal vein.                   - Findings consistent with acute superficial vein thrombosis                   involving the right small saphenous vein.                   -  No evidence of deep vein thrombosis involving the left lower                   extremity.                   - No evidence of Baker&'s cyst on the right or left. Performing Technologist: Oliver Hum RVT  Examination Guidelines: A complete evaluation includes B-mode imaging, spectral Doppler, color Doppler, and power Doppler as needed of all accessible portions of each vessel. Bilateral testing is considered an integral part of a complete examination. Limited examinations for reoccurring indications may be performed as noted. The reflux portion of the exam is performed with the patient in reverse Trendelenburg.  +---------+---------------+---------+-----------+----------+--------------+ RIGHT    CompressibilityPhasicitySpontaneityPropertiesThrombus Aging +---------+---------------+---------+-----------+----------+--------------+ CFV      Partial        Yes      Yes                  Chronic        +---------+---------------+---------+-----------+----------+--------------+ SFJ      Full                                                        +---------+---------------+---------+-----------+----------+--------------+ FV Prox  Full                                                         +---------+---------------+---------+-----------+----------+--------------+ FV Mid                  Yes      Yes                                 +---------+---------------+---------+-----------+----------+--------------+ FV DistalFull                                                        +---------+---------------+---------+-----------+----------+--------------+ PFV      Full                                                        +---------+---------------+---------+-----------+----------+--------------+ POP      Full           Yes      Yes                                 +---------+---------------+---------+-----------+----------+--------------+ PTV      Full                                                        +---------+---------------+---------+-----------+----------+--------------+  PERO     Full                                                        +---------+---------------+---------+-----------+----------+--------------+   +---------+---------------+---------+-----------+----------+-------------------+ LEFT     CompressibilityPhasicitySpontaneityPropertiesThrombus Aging      +---------+---------------+---------+-----------+----------+-------------------+ CFV      Full           Yes      Yes                                      +---------+---------------+---------+-----------+----------+-------------------+ SFJ      Full                                                             +---------+---------------+---------+-----------+----------+-------------------+ FV Prox  Full                                                             +---------+---------------+---------+-----------+----------+-------------------+ FV Mid   Full                                                             +---------+---------------+---------+-----------+----------+-------------------+ FV Distal               Yes      Yes                                       +---------+---------------+---------+-----------+----------+-------------------+ PFV      Full                                                             +---------+---------------+---------+-----------+----------+-------------------+ POP      Full           Yes      Yes                                      +---------+---------------+---------+-----------+----------+-------------------+ PTV      Full                                                             +---------+---------------+---------+-----------+----------+-------------------+ PERO  Not well visualized +---------+---------------+---------+-----------+----------+-------------------+    Summary: RIGHT: - 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 - No cystic structure found in the popliteal fossa.  LEFT: - There is no evidence of deep vein thrombosis in the lower extremity. However, portions of this examination were limited- see technologist comments above.  - No cystic structure found in the popliteal fossa.  *See table(s) above for measurements and observations.    Preliminary    [place] EKG SITE RITE  Result Date: 12/26/2021 If Site Rite image not attached, placement could not be confirmed due to current cardiac rhythm.  DG Abd Portable 1V  Result Date: 12/26/2021 CLINICAL DATA:  Small-bowel obstruction EXAM: PORTABLE ABDOMEN - 1 VIEW COMPARISON:  12/25/2021 FINDINGS: Persistent dilated loops of small bowel particularly in the left abdomen. Maximum measurable distension is decreased. Calcified uterine fibroid is again noted. IMPRESSION: Persistent small bowel obstruction. Maximum distension measures decreased. Electronically Signed   By: Macy Mis M.D.   On: 12/26/2021 08:24   DG Abd Portable 1V  Result Date: 12/25/2021 CLINICAL DATA:  Small bowel obstruction. EXAM: PORTABLE ABDOMEN -  1 VIEW COMPARISON:  Abdominal x-ray from yesterday. FINDINGS: Enteric tube incompletely visualized looped in the left chest, presumably within the large hiatal hernia as seen on CT. Multiple dilated loops of small bowel are unchanged. Calcified uterine fibroid again noted. No acute osseous abnormality. IMPRESSION: 1. Unchanged small bowel obstruction. Electronically Signed   By: Titus Dubin M.D.   On: 12/25/2021 10:31   DG Abd 1 View  Result Date: 12/24/2021 CLINICAL DATA:  None hour delayed film.  Small bowel obstruction. EXAM: ABDOMEN - 1 VIEW COMPARISON:  12/23/2021 FINDINGS: Continued small bowel dilatation compatible with small bowel obstruction. Likely not significantly changed. No visible free air. Large calcified fibroid in the right side of the pelvis. NG tube is not visualized. This likely projects off the superior aspect of the image as the upper abdomen is not included on this single image. IMPRESSION: Continued small bowel obstruction pattern, not significantly changed. Electronically Signed   By: Rolm Baptise M.D.   On: 12/24/2021 23:14     LOS: 6 days   Antonieta Pert, MD [facility]  12/26/2021, 1:57 PM

## 2021-12-27 ENCOUNTER — Encounter: Payer: Self-pay | Admitting: Infectious Diseases

## 2021-12-27 DIAGNOSIS — I5041 Acute combined systolic (congestive) and diastolic (congestive) heart failure: Secondary | ICD-10-CM | POA: Diagnosis not present

## 2021-12-27 DIAGNOSIS — I429 Cardiomyopathy, unspecified: Secondary | ICD-10-CM

## 2021-12-27 DIAGNOSIS — I10 Essential (primary) hypertension: Secondary | ICD-10-CM | POA: Diagnosis not present

## 2021-12-27 DIAGNOSIS — Z7901 Long term (current) use of anticoagulants: Secondary | ICD-10-CM | POA: Diagnosis not present

## 2021-12-27 DIAGNOSIS — K56609 Unspecified intestinal obstruction, unspecified as to partial versus complete obstruction: Secondary | ICD-10-CM | POA: Diagnosis not present

## 2021-12-27 LAB — COMPREHENSIVE METABOLIC PANEL
ALT: 8 U/L (ref 0–44)
AST: 10 U/L — ABNORMAL LOW (ref 15–41)
Albumin: 2.1 g/dL — ABNORMAL LOW (ref 3.5–5.0)
Alkaline Phosphatase: 74 U/L (ref 38–126)
Anion gap: 6 (ref 5–15)
BUN: 9 mg/dL (ref 8–23)
CO2: 27 mmol/L (ref 22–32)
Calcium: 8 mg/dL — ABNORMAL LOW (ref 8.9–10.3)
Chloride: 108 mmol/L (ref 98–111)
Creatinine, Ser: 0.66 mg/dL (ref 0.44–1.00)
GFR, Estimated: >60 mL/min (ref >60)
Glucose, Bld: 108 mg/dL — ABNORMAL HIGH (ref 70–99)
Potassium: 3.3 mmol/L — ABNORMAL LOW (ref 3.5–5.1)
Sodium: 141 mmol/L (ref 135–145)
Total Bilirubin: 0.3 mg/dL (ref 0.3–1.2)
Total Protein: 6.9 g/dL (ref 6.5–8.1)

## 2021-12-27 LAB — HEPARIN LEVEL (UNFRACTIONATED)
Heparin Unfractionated: <0.1 IU/mL — ABNORMAL LOW (ref 0.30–0.70)
Heparin Unfractionated: 0.39 IU/mL (ref 0.30–0.70)

## 2021-12-27 LAB — CBC WITH DIFFERENTIAL/PLATELET
Abs Immature Granulocytes: 0.11 10*3/uL — ABNORMAL HIGH (ref 0.00–0.07)
Basophils Absolute: 0 10*3/uL (ref 0.0–0.1)
Basophils Relative: 0 %
Eosinophils Absolute: 0.1 10*3/uL (ref 0.0–0.5)
Eosinophils Relative: 1 %
HCT: 28.5 % — ABNORMAL LOW (ref 36.0–46.0)
Hemoglobin: 9.8 g/dL — ABNORMAL LOW (ref 12.0–15.0)
Immature Granulocytes: 1 %
Lymphocytes Relative: 13 %
Lymphs Abs: 1.1 10*3/uL (ref 0.7–4.0)
MCH: 26.5 pg (ref 26.0–34.0)
MCHC: 34.4 g/dL (ref 30.0–36.0)
MCV: 77 fL — ABNORMAL LOW (ref 80.0–100.0)
Monocytes Absolute: 0.7 10*3/uL (ref 0.1–1.0)
Monocytes Relative: 8 %
Neutro Abs: 6.4 10*3/uL (ref 1.7–7.7)
Neutrophils Relative %: 77 %
Platelets: 239 10*3/uL (ref 150–400)
RBC: 3.7 MIL/uL — ABNORMAL LOW (ref 3.87–5.11)
RDW: 17.4 % — ABNORMAL HIGH (ref 11.5–15.5)
WBC: 8.4 10*3/uL (ref 4.0–10.5)
nRBC: 0 % (ref 0.0–0.2)

## 2021-12-27 LAB — GLUCOSE, CAPILLARY
Glucose-Capillary: 114 mg/dL — ABNORMAL HIGH (ref 70–99)
Glucose-Capillary: 136 mg/dL — ABNORMAL HIGH (ref 70–99)
Glucose-Capillary: 92 mg/dL (ref 70–99)

## 2021-12-27 LAB — HEMOGLOBIN A1C
Hgb A1c MFr Bld: 5.2 % (ref 4.8–5.6)
Mean Plasma Glucose: 102.54 mg/dL

## 2021-12-27 LAB — MAGNESIUM: Magnesium: 1.8 mg/dL (ref 1.7–2.4)

## 2021-12-27 LAB — PHOSPHORUS: Phosphorus: 2.2 mg/dL — ABNORMAL LOW (ref 2.5–4.6)

## 2021-12-27 LAB — TRIGLYCERIDES: Triglycerides: 76 mg/dL (ref <150)

## 2021-12-27 MED ORDER — HEPARIN (PORCINE) 25000 UT/250ML-% IV SOLN
1400.0000 [IU]/h | INTRAVENOUS | Status: AC
  Administered 2021-12-27: 1400 [IU]/h via INTRAVENOUS
  Filled 2021-12-27: qty 250

## 2021-12-27 MED ORDER — TRAVASOL 10 % IV SOLN: INTRAVENOUS | Status: DC

## 2021-12-27 MED ORDER — POTASSIUM PHOSPHATES 15 MMOLE/5ML IV SOLN
30.0000 mmol | Freq: Once | INTRAVENOUS | Status: AC
  Administered 2021-12-27: 30 mmol via INTRAVENOUS
  Filled 2021-12-27: qty 10

## 2021-12-27 MED ORDER — INSULIN ASPART 100 UNIT/ML IJ SOLN
0.0000 [IU] | Freq: Four times a day (QID) | INTRAMUSCULAR | Status: DC
  Administered 2021-12-28 (×2): 1 [IU] via SUBCUTANEOUS
  Administered 2021-12-28 – 2021-12-29 (×3): 2 [IU] via SUBCUTANEOUS
  Administered 2021-12-29: 1 [IU] via SUBCUTANEOUS
  Administered 2021-12-29: 3 [IU] via SUBCUTANEOUS
  Administered 2021-12-30 (×2): 1 [IU] via SUBCUTANEOUS
  Administered 2021-12-30: 2 [IU] via SUBCUTANEOUS
  Administered 2021-12-30 – 2022-01-03 (×9): 1 [IU] via SUBCUTANEOUS

## 2021-12-27 MED ORDER — DEXTROSE-NACL 5-0.45 % IV SOLN
INTRAVENOUS | Status: DC
Start: 2021-12-27 — End: 2022-01-10

## 2021-12-27 MED ORDER — CHLORHEXIDINE GLUCONATE CLOTH 2 % EX PADS
6.0000 | MEDICATED_PAD | Freq: Every day | CUTANEOUS | Status: DC
  Administered 2021-12-27 – 2022-01-10 (×14): 6 via TOPICAL

## 2021-12-27 MED ORDER — SODIUM CHLORIDE 0.9% FLUSH
10.0000 mL | INTRAVENOUS | Status: DC | PRN
  Administered 2022-01-09 – 2022-01-10 (×2): 10 mL

## 2021-12-27 MED ORDER — TRAVASOL 10 % IV SOLN
INTRAVENOUS | Status: AC
  Filled 2021-12-27: qty 451.2

## 2021-12-27 MED ORDER — SODIUM CHLORIDE 0.9% FLUSH
10.0000 mL | Freq: Two times a day (BID) | INTRAVENOUS | Status: DC
  Administered 2021-12-28 – 2022-01-09 (×15): 10 mL

## 2021-12-27 MED ORDER — MIDAZOLAM HCL 2 MG/2ML IJ SOLN
INTRAMUSCULAR | Status: AC
  Filled 2021-12-27: qty 2

## 2021-12-27 NOTE — Progress Notes (Signed)
Nutrition Follow-up  DOCUMENTATION CODES:   Not applicable  INTERVENTION:  - TPN initiation and management per Pharmacist.  - monitor magnesium, potassium, and phosphorus BID for at least 3 days, MD to replete as needed, as pt is at risk for refeeding syndrome given current hypokalemia and hypophosphatemia and NPO since admission.   - will monitor for ability to advance diet.    NUTRITION DIAGNOSIS:   Inadequate oral intake related to acute illness, nausea, vomiting as evidenced by NPO status. -ongoing  GOAL:   Patient will meet greater than or equal to 90% of their needs -unable to meet at this time.  MONITOR:   Diet advancement, Labs, Weight trends, Skin, I & O's, Other (Comment) (TPN regimen)  REASON FOR ASSESSMENT:   Consult New TPN/TNA  ASSESSMENT:   66 y.o. 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  Patient laying in bed with asleep but awakes easily to name call x1. She is pending PICC placement today. NGT in place since 6/22 with most recent abdominal x-ray mentioning it being on 6/27 when it was noted and thought to be within the hiatal hernia in gastric region. Noted ~150 ml dark green output in canister during RD visit.  Patient denies abdominal pain, pressure, or nausea. She has no questions or concerns surrounding PICC placement and initiation of TPN today.   Order currently in place for custom TPN @ 40 ml/hr to start tonight. Documented goal regimen of 80 ml/hr to provide 2085 kcal and 90 grams protein.   Weight yesterday +10 lb compared to weight on 6/22.  Notes indicate likely need for surgical intervention d/t SBO; Cardiology consulted for recs pre-op.    Labs reviewed;  K: 3.3 mmol/l, Ca: 8 mg/dl, Phos: 2.2 mg/dl, triglycerides: 76 mg/dl.  Medications reviewed; sliding scale novolog, 100 mcg oral synthroid/day, 40 mg IV protonix BID, 30 mmol IV KPhos x1 run 6/29.  IVF; D5-1/2 NS @ 50 ml/hr to decrease to 10 ml/hr with TPN start today at 1800.    NUTRITION - FOCUSED PHYSICAL EXAM:  Flowsheet Row Most Recent Value  Orbital Region No depletion  Upper Arm Region No depletion  Thoracic and Lumbar Region Unable to assess  Buccal Region No depletion  Temple Region No depletion  Clavicle Bone Region No depletion  Clavicle and Acromion Bone Region No depletion  Scapular Bone Region Unable to assess  Dorsal Hand No depletion  Patellar Region No depletion  Anterior Thigh Region No depletion  Posterior Calf Region No depletion  Edema (RD Assessment) Mild  [BLE]  Hair Reviewed  Eyes Reviewed  Mouth Reviewed  Skin Reviewed  Nails Unable to assess  [nail polish]       Diet Order:   Diet Order             Diet NPO time specified Except for: Ice Chips  Diet effective now                   EDUCATION NEEDS:   Not appropriate for education at this time  Skin:  Skin Assessment: Skin Integrity Issues: Skin Integrity Issues:: Stage III Stage III: sacrum  Last BM:  6/22 per flow sheet documentation  Height:   Ht Readings from Last 1 Encounters:  12/20/21 '5\' 6"'$  (1.676 m)    Weight:  Wt Readings from Last 1 Encounters:  12/26/21 84.1 kg     BMI:  Body mass index is 29.93 kg/m.  Estimated Nutritional Needs:  Kcal:  1950-2150 Protein:  85-95g Fluid:  2L/day     Jarome Matin, MS, RD, LDN, CNSC Registered Dietitian II Inpatient Clinical Nutrition RD pager # and on-call/weekend pager # available in [place]

## 2021-12-27 NOTE — Progress Notes (Signed)
PROGRESS NOTE Katrina Donaldson  GMW:[id number] DOB: Apr 03, 1956 DOA: 12/20/2021 PCP: Donald Prose, MD   Brief Narrative/Hospital Course: 66 y.o.f w/ history significant of laparoscopic partial colectomy in 01/2021 2/2 cecal mass, thyroidectomy, brain aneurysm, chronic anticoagulation, hypertension, hypothyroidism, CVA, sacral osteomyelitis, wheelchair-bound presented from SNF complaining of abdominal pain, nausea, vomiting, poor appetite  x 5 days, last BM 5 days PTA. In the ED, vital signs noted for tachycardia otherwise fairly stable, labs noted for mild anemia, otherwise stable.  CT abdomen/pelvis showed high-grade SBO likely due to adhesions, large hiatal hernia.  Surgery consulted and patient admitted for further management  Patient has failed to progress despite having NG tube decompression IV fluid hydration. She was noted to have tachycardia abnormal EKG, no chest pain troponins are negative, EKG shows sinus tachycardia. Echocardiogram showed decreased EF 30-35%. Labs with improved/stable renal function, stable hemoglobin at 10.2 g.    Subjective: Seen and examined this morning.  Denies abdominal pain nausea vomiting, no flatus yet.  NG tube in place on supplemental oxygen. Overnight labs showed stable hemoglobin 9.9 g troponin was negative, phosphorus 2.2, potassium 3.3 Afebrile overnight. NG output 600 cc.  Assessment and Plan: Principal Problem:   SBO (small bowel obstruction) (HCC) Active Problems:   Essential hypertension   Anemia, iron deficiency   Hypothyroidism   Hypercoagulable state ([place])   Hypokalemia   Chronic anticoagulation   Cecum mass   Sacral osteomyelitis (HCC)   AKI (acute kidney injury) ([place])   Pressure ulcer of sacral region, stage 3 (HCC)   Cardiomyopathy (HCC)   SBO  Previous laparoscopic partial colectomy for cecal mass: X-ray with persistent SBO 12/26/21. [facility] following closely.  Still had no flatus or BM and has ongoing persistent SBO. Continue NGT  decompression, IV fluid hydration antiemetics PPI-pharmacy consultED for TPN.  Further recommendation per surgery -likely needs surgical intervention,has new onset systolic dysfunction  in echo although hemodynamically stable no chest pain-and cardiology has been consulted for further recommendations preop.  Episode of fever resolved-no recurrence.UA unremarkable for UTI blood cultures x2 NGTD, chest x-ray showed possible atelectasis.  Monitor off antibiotics.  Acute urine retention-resolved.  External catheter in place  Hypernatremia w/ Hyperchloremia: Sodium chloride normalized.  On D5/0.45NS Hypokalemia adjust TPN  Essential hypertension Sinus tachycardia Cardiomyopathy showing new acute systolic dysfunction with EF 30 to 35%: Patient having sinus tachycardia in low 100.  Normally on '25MG'$  metoprolol twice daily at home on hold due to  systolic dysfunction/npo-Echo with newly reduced EF, normal in 2016.  No chest pain troponin negative.  Euvolemic on exam.  Continue Lopressor 2.5 mg IV PRN. Cardiology has been consulted given new acute systolic dysfunction.  We will monitor intake output, watch for fluid overload- noted some wt changes-if symptomatic add lasix.Net IO Since Admission: 759.13 mL [12/27/21 1034]  Filed Weights   12/20/21 1823 12/23/21 0600 12/26/21 1230  Weight: 79.7 kg 80.4 kg 84.1 kg    Anemia, iron deficiency/normocytic anemia/anemia of chronic disease: Chronic.  Stable in 9- 10 g range.  Monitor.  Hypothyroidism: Continue IV Synthroid  Hypercoagulable state-history of DVT in 2018-on Xarelto PTA.  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  Xarelto Was held on admission-continuing heparin drip for now hold preoperatively  per CCS,   AKI: Mild,- has resolved. GERD continue IV PPI. HLD cont holding statin.  Stage III pressure ulcer on sacrum POA: Continue wound care and offloading Pressure Injury 12/20/21 Sacrum Posterior Stage 3 -  Full thickness tissue loss. Subcutaneous fat may be visible but bone, tendon or muscle are NOT exposed. Red (Active)  12/20/21 1950  Location:S[place]  Location Orientation:Posterior  Staging:Stage 3-Full thickness tissue loss. Subcutaneous fat may be visible but bone, tendon or muscle are NOT exposed.  Wound Description (Comments):Red  Present on Admission:Yes  Dressing Type Foam:Lift dressing to assess site every shift. 12/26/21 1008  DVT prophylaxis: SCDs Start: 12/20/21 1811 Code Status:   Code Status: Full Code Family Communication: plan of care discussed with patient at bedside. I had called to update daughter Shirlee Limerick and Son Mr Caissie but no answer 6/28-  and then proceeded to call sister and updated her- all questions were answered. Patient status is: INPATIENT because of FOR SBO Level of care: Telemetry   Dispo: The patient is from: HOME            Anticipated disposition: TBD PENDING SBO resolution- likely needs surgery  Mobility Assessment (last 72 hours)     Mobility Assessment     Row Name 12/26/21 1910 12/26/21 1008 12/25/21 2124 12/25/21 1003 12/24/21 1935   Does patient have an order for bedrest or is patient medically unstable Yes- Bedfast (Level 1) - Complete -- Yes- Bedfast (Level 1) - Complete Yes- Bedfast (Level 1) - Complete Yes- Bedfast (Level 1) - Complete   What is the highest level of mobility based on the progressive mobility assessment? Level 1 (Bedfast) - Unable to balance while sitting on edge of bed Level 1 (Bedfast) - Unable to balance while sitting on edge of bed Level 1 (Bedfast) - Unable to balance while sitting on edge of bed Level 1 (Bedfast) - Unable to balance while sitting on edge of bed Level 1 (Bedfast) - Unable to balance while sitting on edge of bed   Is the above level different from baseline mobility prior to current illness? No - Consider discontinuing PT/OT No - Consider discontinuing PT/OT No - Consider discontinuing PT/OT No - Consider  discontinuing PT/OT No - Consider discontinuing PT/OT           Objective: Vitals last 24 hrs: Vitals:   12/26/21 1650 12/26/21 2112 12/27/21 0154 12/27/21 0533  BP: 121/82 127/83 137/78 105/82  Pulse: (!) 106 (!) 121 (!) 109 (!) 116  Resp: '18 18 16 18  '$ Temp: 99.8 F (37.7 C) 98.4 F (36.9 C) 99 F (37.2 C) 98 F (36.7 C)  TempSrc: Oral Oral Oral Oral  SpO2: 97% 100% 100% 99%  Weight:      Height:       Weight change:   Physical Examination: General exam: AA0X3, older than stated age, weak appearing.ngt+, [place] o2+ HEENT:Oral mucosa moist, Ear/Nose WNL grossly, dentition normal. Respiratory system: bilaterally diminished, no use of accessory muscle Cardiovascular system: S1 & S2 +, No JVD,. Gastrointestinal system: Abdomen soft, nondistended nontender bowel sound absent  Nervous System:Alert, awake, moving extremities and grossly nonfocal Extremities: LE ankle edema NEG, distal peripheral pulses palpable.  Skin: No rashes,no icterus. MSK: Normal muscle bulk,tone, power.   Medications reviewed:  Scheduled Meds:  diatrizoate meglumine-sodium  90 mL Per NG tube Once   fluticasone  1 spray Each Nare BID   insulin aspart  0-9 Units Subcutaneous Q6H   levothyroxine  100 mcg Intravenous Daily   pantoprazole (PROTONIX) IV  40 mg Intravenous Q12H  Continuous Infusions:  dextrose 5 % and 0.45% NaCl 50 mL/hr at 12/27/21 0333   dextrose 5 % and 0.45% NaCl     potassium PHOSPHATE  IVPB (in mmol) 30 mmol (12/27/21 0934)   TPN ADULT (ION)     Diet Order             Diet NPO time specified Except for: Ice Chips  Diet effective now                 Nutrition Problem: Inadequate oral intake Etiology: acute illness, nausea, vomiting Signs/Symptoms: NPO status Interventions: Refer to RD note for recommendations   Intake/Output Summary (Last 24 hours) at 12/27/2021 1034 Last data filed at 12/27/2021 0507 Gross per 24 hour  Intake 1910.09 ml  Output 1400 ml  Net 510.09 ml    Net IO Since Admission: 759.13 mL [12/27/21 1034]  Wt Readings from Last 3 Encounters:  12/26/21 84.1 kg  09/11/21 77.1 kg  01/31/21 89.1 kg     Unresulted Labs (From admission, onward)     Start     Ordered   12/28/21 2703  Basic metabolic panel  Tomorrow morning,   R        12/27/21 0808   12/28/21 0500  Phosphorus  Tomorrow morning,   R        12/27/21 0808   12/28/21 0500  Magnesium  Tomorrow morning,   R        12/27/21 0827   12/27/21 0500  Comprehensive metabolic panel  (TPN Lab Panel)  Every Mon,Thu (0500),   R      12/26/21 1229   12/27/21 0500  Magnesium  (TPN Lab Panel)  Every Mon,Thu (0500),   R      12/26/21 1229   12/27/21 0500  Phosphorus  (TPN Lab Panel)  Every Mon,Thu (0500),   R      12/26/21 1229   12/27/21 0500  Triglycerides  (TPN Lab Panel)  Every Mon,Thu (0500),   R      12/26/21 1229   12/22/21 0500  CBC with Differential/Platelet  Daily,   R      12/21/21 1543          Data Reviewed: I have personally reviewed following labs and imaging studies CBC: Recent Labs  Lab 12/23/21 0453 12/24/21 0228 12/25/21 0439 12/26/21 0458 12/27/21 0433  WBC 4.0 4.2 6.7 6.6 8.4  NEUTROABS 3.4 2.5 4.4 4.6 6.4  HGB 10.6* 10.1* 10.1* 10.2* 9.8*  HCT 32.4* 30.0* 29.6* 30.0* 28.5*  MCV 81.0 77.9* 77.1* 77.7* 77.0*  PLT 241 253 257 248 500   Basic Metabolic Panel: Recent Labs  Lab 12/22/21 0515 12/23/21 0453 12/24/21 0228 12/25/21 0439 12/26/21 0458 12/27/21 0433  NA 145 150* 145 142 138 141  K 3.4* 3.7 3.0* 3.3* 3.9 3.3*  CL 111 117* 111 108 106 108  CO2 19* 21* '27 28 26 27  '$ GLUCOSE 84 96 130* 162* 112* 108*  BUN 33* '23 19 16 13 9  '$ CREATININE 1.03* 0.91 0.74 0.77 0.63 0.66  CALCIUM 8.2* 8.7* 8.7* 8.2* 7.9* 8.0*  MG 2.2  --  2.2  --   --  1.8  PHOS  --   --   --   --   --  2.2*   GFR: Estimated Creatinine Clearance: 73.5 mL/min (by C-G formula based on SCr of 0.66 mg/dL). Liver Function Tests: Recent Labs  Lab 12/20/21 1306 12/21/21 0507  12/27/21 0433  AST 15 14* 10*  ALT '11 11 8  '$ ALKPHOS 88 82 74  BILITOT 0.6 0.9 0.3  PROT 8.5* 8.1 6.9  ALBUMIN 3.1* 3.0* 2.1*   Recent  Labs  Lab 12/20/21 1306  LIPASE 44   No results for input(s): "AMMONIA" in the last 168 hours. Coagulation Profile: Recent Labs  Lab 12/21/21 0507  INR 1.7*   BNP (last 3 results) No results for input(s): "PROBNP" in the last 8760 hours. HbA1C: Recent Labs    12/27/21 0433  HGBA1C 5.2   CBG: Recent Labs  Lab 12/25/21 2109  GLUCAP 110*   Lipid Profile: Recent Labs    12/27/21 0433  TRIG 76   Thyroid Function Tests: No results for input(s): "TSH", "T4TOTAL", "FREET4", "T3FREE", "THYROIDAB" in the last 72 hours. Sepsis Labs: Recent Labs  Lab 12/20/21 1215 12/20/21 1911  LATICACIDVEN 1.2 1.5    Recent Results (from the past 240 hour(s))  Resp Panel by RT-PCR (Flu A&B, Covid) Anterior Nasal Swab     Status: None   Collection Time: 12/20/21 12:40 PM   Specimen: Anterior Nasal Swab  Result Value Ref Range Status   SARS Coronavirus 2 by RT PCR NEGATIVE NEGATIVE Final    Comment: (NOTE) SARS-CoV-2 target nucleic acids are NOT DETECTED.  The SARS-CoV-2 RNA is generally detectable in upper respiratory specimens during the acute phase of infection. The lowest concentration of SARS-CoV-2 viral copies this assay can detect is 138 copies/mL. A negative result does not preclude SARS-Cov-2 infection and should not be used as the sole basis for treatment or other patient management decisions. A negative result may occur with  improper specimen collection/handling, submission of specimen other than nasopharyngeal swab, presence of viral mutation(s) within the areas targeted by this assay, and inadequate number of viral copies(<138 copies/mL). A negative result must be combined with clinical observations, patient history, and epidemiological information. The expected result is Negative.  Fact Sheet for Patients:   EntrepreneurPulse.com.au  Fact Sheet for Healthcare Providers:  IncredibleEmployment.be  This test is no t yet approved or cleared by the [place] FDA and  has been authorized for detection and/or diagnosis of SARS-CoV-2 by FDA under an Emergency Use Authorization (EUA). This EUA will remain  in effect (meaning this test can be used) for the duration of the COVID-19 declaration under Section 564(b)(1) of the Act, 21 U.S.C.section 360bbb-3(b)(1), unless the authorization is terminated  or revoked sooner.       Influenza A by PCR NEGATIVE NEGATIVE Final   Influenza B by PCR NEGATIVE NEGATIVE Final    Comment: (NOTE) The Xpert Xpress SARS-CoV-2/FLU/RSV plus assay is intended as an aid in the diagnosis of influenza from Nasopharyngeal swab specimens and should not be used as a sole basis for treatment. Nasal washings and aspirates are unacceptable for Xpert Xpress SARS-CoV-2/FLU/RSV testing.  Fact Sheet for Patients: EntrepreneurPulse.com.au  Fact Sheet for Healthcare Providers: IncredibleEmployment.be  This test is not yet approved or cleared by the [place] FDA and has been authorized for detection and/or diagnosis of SARS-CoV-2 by FDA under an Emergency Use Authorization (EUA). This EUA will remain in effect (meaning this test can be used) for the duration of the COVID-19 declaration under Section 564(b)(1) of the Act, 21 U.S.C. section 360bbb-3(b)(1), unless the authorization is terminated or revoked.  Performed at [facility], [place] [address], [place], [place] [address]   Culture, blood (Routine X 2) w Reflex to ID Panel     Status: None   Collection Time: 12/21/21  7:54 AM   Specimen: BLOOD  Result Value Ref Range Status   Specimen Description   Final    BLOOD LEFT ANTECUBITAL Performed at [facility]  [facility], [place] [address] [place], [place] [address]     Special Requests   Final    BOTTLES DRAWN AEROBIC AND ANAEROBIC Blood Culture adequate volume Performed at [place] [address], [place], [place] [address]    Culture   Final    NO GROWTH 5 DAYS Performed at [facility], [place] [address], [place], [place] [address]    Report Status 12/26/2021 FINAL  Final  Culture, blood (Routine X 2) w Reflex to ID Panel     Status: None   Collection Time: 12/21/21  8:00 AM   Specimen: BLOOD RIGHT HAND  Result Value Ref Range Status   Specimen Description   Final    BLOOD RIGHT HAND Performed at [place] [address], [place], [place] [address]    Special Requests   Final    BOTTLES DRAWN AEROBIC ONLY Blood Culture adequate volume Performed at [place] [address], [place], [place] [address]    Culture   Final    NO GROWTH 5 DAYS Performed at [facility], [place] [address], [place], [place] [address]    Report Status 12/26/2021 FINAL  Final    Antimicrobials: Anti-infectives (From admission, onward)    None      Culture/Microbiology    Component Value Date/Time   SDES  12/21/2021 0800    BLOOD RIGHT HAND Performed at [facility], [place] [address], [place], [place] [address]    SPECREQUEST  12/21/2021 0800    BOTTLES DRAWN AEROBIC ONLY Blood Culture adequate volume Performed at [place] [address] [place], [place] [address]    CULT  12/21/2021 0800    NO GROWTH 5 DAYS Performed at [facility], [place] [address], [place],  [address]    REPTSTATUS 12/26/2021 FINAL 12/21/2021 0800    Other culture-see note  Radiology Studies: VAS [place] LOWER EXTREMITY VENOUS (DVT)  Result Date: 12/26/2021  Lower Venous DVT Study Patient Name:  VRINDA HECKSTALL  Date of Exam:   12/26/2021 Medical Rec #: [id number]        Accession #:    [id number] Date of Birth: 1956-06-08        Patient Gender: F Patient Age:    89 years Exam Location:  [facility] Procedure:      VAS [place] LOWER EXTREMITY VENOUS (DVT) Referring Phys: [place] --------------------------------------------------------------------------------  Indications: Swelling.  Risk Factors: DVT. Limitations: Poor ultrasound/tissue interface and patient positioning, patient immobility, patient pain tolerance. Comparison Study: 07/25/2016 - - Findings consistent with acute deep vein                   thrombosis involving the                   right common femoral vein, right profunda femoris vein, right                   femoral vein, right popliteal vein, right posterial tibial                   vein,                   and right peroneal vein.                   - Findings consistent with acute superficial vein thrombosis  involving the right small saphenous vein.                   - No evidence of deep vein thrombosis involving the left lower                   extremity.                   - No evidence of Baker&'s cyst on the right or left. Performing Technologist: Oliver Hum RVT  Examination Guidelines: A complete evaluation includes B-mode imaging, spectral Doppler, color Doppler, and power Doppler as needed of all accessible portions of each vessel. Bilateral testing is considered an integral part of a complete examination. Limited examinations for reoccurring indications may be performed as noted. The reflux portion of the exam is performed with the patient in reverse Trendelenburg.  +---------+---------------+---------+-----------+----------+--------------+ RIGHT    CompressibilityPhasicitySpontaneityPropertiesThrombus Aging +---------+---------------+---------+-----------+----------+--------------+ CFV      Partial        Yes      Yes                  Chronic        +---------+---------------+---------+-----------+----------+--------------+ SFJ      Full                                                         +---------+---------------+---------+-----------+----------+--------------+ FV Prox  Full                                                        +---------+---------------+---------+-----------+----------+--------------+ FV Mid                  Yes      Yes                                 +---------+---------------+---------+-----------+----------+--------------+ FV DistalFull                                                        +---------+---------------+---------+-----------+----------+--------------+ PFV      Full                                                        +---------+---------------+---------+-----------+----------+--------------+ POP      Full           Yes      Yes                                 +---------+---------------+---------+-----------+----------+--------------+ PTV      Full                                                        +---------+---------------+---------+-----------+----------+--------------+  PERO     Full                                                        +---------+---------------+---------+-----------+----------+--------------+   +---------+---------------+---------+-----------+----------+-------------------+ LEFT     CompressibilityPhasicitySpontaneityPropertiesThrombus Aging      +---------+---------------+---------+-----------+----------+-------------------+ CFV      Full           Yes      Yes                                      +---------+---------------+---------+-----------+----------+-------------------+ SFJ      Full                                                             +---------+---------------+---------+-----------+----------+-------------------+ FV Prox  Full                                                             +---------+---------------+---------+-----------+----------+-------------------+ FV Mid   Full                                                              +---------+---------------+---------+-----------+----------+-------------------+ FV Distal               Yes      Yes                                      +---------+---------------+---------+-----------+----------+-------------------+ PFV      Full                                                             +---------+---------------+---------+-----------+----------+-------------------+ POP      Full           Yes      Yes                                      +---------+---------------+---------+-----------+----------+-------------------+ PTV      Full                                                             +---------+---------------+---------+-----------+----------+-------------------+ PERO  Not well visualized +---------+---------------+---------+-----------+----------+-------------------+     Summary: RIGHT: - 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 - No cystic structure found in the popliteal fossa.  LEFT: - There is no evidence of deep vein thrombosis in the lower extremity. However, portions of this examination were limited- see technologist comments above.  - No cystic structure found in the popliteal fossa.  *See table(s) above for measurements and observations. Electronically signed by Orlie Pollen on 12/26/2021 at 5:31:11 PM.    Final    ECHOCARDIOGRAM COMPLETE  Result Date: 12/26/2021    ECHOCARDIOGRAM REPORT   Patient Name:   RAESHAWN TAFOLLA Date of Exam: 12/26/2021 Medical Rec #:  [id number]       Height:       66.0 in Accession #:    [id number]      Weight:       177.2 lb Date of Birth:  [birth year]       BSA:          1.900 m Patient Age:    76 years        BP:           125/82 mmHg Patient Gender: F               HR:           120 bpm. Exam Location:  Inpatient Procedure: 2D Echo, Color Doppler and Cardiac Doppler Indications:    Abnormal ekg  History:         Patient has no prior history of Echocardiogram examinations.  Sonographer:    Joette Catching RCS Referring Phys: [id number] [place]  Sonographer Comments: Technically challenging study due to limited acoustic windows. Supine scan. IMPRESSIONS  1. Left ventricular ejection fraction, by estimation, is 30 to 35%. The left ventricle has moderately decreased function. The left ventricle demonstrates global hypokinesis. Left ventricular diastolic parameters are consistent with Grade I diastolic dysfunction (impaired relaxation).  2. Right ventricular systolic function is normal. The right ventricular size is normal.  3. The mitral valve is normal in structure. No evidence of mitral valve regurgitation. No evidence of mitral stenosis.  4. The aortic valve is calcified. There is mild calcification of the aortic valve. There is mild thickening of the aortic valve. Aortic valve regurgitation is not visualized. Aortic valve sclerosis is present, with no evidence of aortic valve stenosis.  5. The inferior vena cava is normal in size with greater than 50% respiratory variability, suggesting right atrial pressure of 3 mmHg. FINDINGS  Left Ventricle: Left ventricular ejection fraction, by estimation, is 30 to 35%. The left ventricle has moderately decreased function. The left ventricle demonstrates global hypokinesis. The left ventricular internal cavity size was normal in size. There is no left ventricular hypertrophy. Abnormal (paradoxical) septal motion, consistent with left bundle branch block. Left ventricular diastolic parameters are consistent with Grade I diastolic dysfunction (impaired relaxation). Right Ventricle: The right ventricular size is normal. No increase in right ventricular wall thickness. Right ventricular systolic function is normal. Left Atrium: Left atrial size was normal in size. Right Atrium: Right atrial size was normal in size. Pericardium: There is no evidence of pericardial effusion. Mitral Valve:  The mitral valve is normal in structure. No evidence of mitral valve regurgitation. No evidence of mitral valve stenosis. Tricuspid Valve: The tricuspid valve is normal in structure. Tricuspid valve regurgitation is not demonstrated. No evidence of tricuspid stenosis. Aortic Valve: The aortic valve is calcified. There is  mild calcification of the aortic valve. There is mild thickening of the aortic valve. Aortic valve regurgitation is not visualized. Aortic valve sclerosis is present, with no evidence of aortic valve stenosis. Aortic valve mean gradient measures 4.0 mmHg. Aortic valve peak gradient measures 6.2 mmHg. Aortic valve area, by VTI measures 2.26 cm. Pulmonic Valve: The pulmonic valve was normal in structure. Pulmonic valve regurgitation is not visualized. No evidence of pulmonic stenosis. Aorta: The aortic root is normal in size and structure. Venous: The inferior vena cava is normal in size with greater than 50% respiratory variability, suggesting right atrial pressure of 3 mmHg. IAS/Shunts: No atrial level shunt detected by color flow Doppler.  LEFT VENTRICLE PLAX 2D LVIDd:         3.80 cm   Diastology LVIDs:         3.30 cm   LV e' medial:    5.22 cm/s LV PW:         1.00 cm   LV E/e' medial:  10.0 LV IVS:        0.90 cm   LV e' lateral:   5.77 cm/s LVOT diam:     2.30 cm   LV E/e' lateral: 9.1 LV SV:         30 LV SV Index:   16 LVOT Area:     4.15 cm  RIGHT VENTRICLE             IVC RV S prime:     29.70 cm/s  IVC diam: 1.70 cm TAPSE (M-mode): 2.1 cm LEFT ATRIUM             Index LA diam:        2.10 cm 1.11 cm/m LA Vol (A2C):   20.0 ml 10.53 ml/m LA Vol (A4C):   57.6 ml 30.32 ml/m LA Biplane Vol: 35.8 ml 18.84 ml/m  AORTIC VALVE                    PULMONIC VALVE AV Area (Vmax):    2.08 cm     PV Vmax:          1.15 m/s AV Area (Vmean):   2.10 cm     PV Peak grad:     5.3 mmHg AV Area (VTI):     2.26 cm     PR End Diast Vel: 10.76 msec AV Vmax:           125.00 cm/s AV Vmean:          94.000  cm/s AV VTI:            0.134 m AV Peak Grad:      6.2 mmHg AV Mean Grad:      4.0 mmHg LVOT Vmax:         62.70 cm/s LVOT Vmean:        47.600 cm/s LVOT VTI:          0.073 m LVOT/AV VTI ratio: 0.54  AORTA Ao Root diam: 3.40 cm Ao Asc diam:  3.30 cm MITRAL VALVE               TRICUSPID VALVE MV Area (PHT): 5.84 cm    TR Peak grad:   25.0 mmHg MV Decel Time: 130 msec    TR Vmax:        250.00 cm/s MV E velocity: 52.30 cm/s MV A velocity: 78.40 cm/s  SHUNTS MV E/A ratio:  0.67  Systemic VTI:  0.07 m                            Systemic Diam: 2.30 cm Candee Furbish MD Electronically signed by Candee Furbish MD Signature Date/Time: 12/26/2021/5:05:24 PM    Final    [place] EKG SITE RITE  Result Date: 12/26/2021 If Site Rite image not attached, placement could not be confirmed due to current cardiac rhythm.  DG Abd Portable 1V  Result Date: 12/26/2021 CLINICAL DATA:  Small-bowel obstruction EXAM: PORTABLE ABDOMEN - 1 VIEW COMPARISON:  12/25/2021 FINDINGS: Persistent dilated loops of small bowel particularly in the left abdomen. Maximum measurable distension is decreased. Calcified uterine fibroid is again noted. IMPRESSION: Persistent small bowel obstruction. Maximum distension measures decreased. Electronically Signed   By: Macy Mis M.D.   On: 12/26/2021 08:24     LOS: 7 days   Antonieta Pert, MD [facility]  12/27/2021, 10:34 AM

## 2021-12-27 NOTE — Progress Notes (Signed)
[place] for heparin Indication: history of DVT on rivaroxaban PTA, hypercoagulable state  Allergies  Allergen Reactions   Shellfish-Derived Products Other (See Comments)    ALLERGIC TO SHRIMP!! Made the tongue "feel strange" Per patient, she has since eaten crab legs and did not have another reaction.     Patient Measurements: Height: '5\' 6"'$  (167.6 cm) Weight: 84.1 kg (185 lb 6.5 oz) IBW/kg (Calculated) : 59.3 Heparin Dosing Weight: 76 kg  Vital Signs: Temp: 98.4 F (36.9 C) (06/28 2112) Temp Source: Oral (06/28 2112) BP: 127/83 (06/28 2112) Pulse Rate: 121 (06/28 2112)  Labs: Recent Labs    12/24/21 0228 12/25/21 0439 12/26/21 0458 12/26/21 1658 12/26/21 2212  HGB 10.1* 10.1* 10.2*  --   --   HCT 30.0* 29.6* 30.0*  --   --   PLT 253 257 248  --   --   HEPARINUNFRC  --   --   --   --  <0.10*  CREATININE 0.74 0.77 0.63  --   --   TROPONINIHS  --   --   --  12  --      Estimated Creatinine Clearance: 73.5 mL/min (by C-G formula based on SCr of 0.63 mg/dL).   Medical History: Past Medical History:  Diagnosis Date   AKI (acute kidney injury) ([place]) 12/26/2021   Arachnoid cyst    Brain aneurysm    Chronic anticoagulation 05/26/2017   Depression    History of DVT (deep vein thrombosis)    Hypercoagulable state ([place])    Hypertension    Hypothyroidism    Stroke ([place]) 2016   [facility] 11/29/14   Wheelchair bound     Medications: Rivaroxaban 20 mg PO Daily PTA -Last dose: 6/21  Assessment: Pt is a 89 yoF admitted with SBO and initially treated with conservative management. PMH significant for DVT in 2018, hypercoagulable state on Xarelto PTA.   Xarelto held on admission, was on enoxaparin for DVT ppx from 6/22 > 6/27. Heparin drip started on 6/27 but turned off @ 0020 this morning for possible surgery.   Per discussion with CCS and TRH, ok to resume heparin drip at this time. Surgery likely will take place on 6/29, verbal order  to turn off heparin at midnight tonight.   Today, 12/27/21 Heparin level < 0.1 - undetectable Level drawn 6 hours after drip started at 1100 units/hr  Per RN heparin infusing well & no bleeding noted  Goal of Therapy:  Heparin level 0.3-0.7 units/ml Monitor platelets by anticoagulation protocol: Yes   Plan:  increase heparin infusion to 1400 units/hr now Heparin drip to stop @ 0500 on 6/29 prior to surgery HL, CBC daily Monitor for signs of bleeding F/u for post-op anticoag plans  [name], Pharm.D 12/27/2021 1:49 AM

## 2021-12-27 NOTE — Progress Notes (Signed)
[place] for heparin Indication: history of DVT on rivaroxaban PTA, hypercoagulable state  Allergies  Allergen Reactions   Shellfish-Derived Products Other (See Comments)    ALLERGIC TO SHRIMP!! Made the tongue "feel strange" Per patient, she has since eaten crab legs and did not have another reaction.     Patient Measurements: Height: '5\' 6"'$  (167.6 cm) Weight: 84.1 kg (185 lb 6.5 oz) IBW/kg (Calculated) : 59.3 Heparin Dosing Weight: 76 kg  Vital Signs: Temp: 98.7 F (37.1 C) (06/29 2140) Temp Source: Oral (06/29 2140) BP: 143/79 (06/29 2140) Pulse Rate: 110 (06/29 2140)  Labs: Recent Labs    12/25/21 0439 12/26/21 0458 12/26/21 1658 12/26/21 2212 12/27/21 0433 12/27/21 2309  HGB 10.1* 10.2*  --   --  9.8*  --   HCT 29.6* 30.0*  --   --  28.5*  --   PLT 257 248  --   --  239  --   HEPARINUNFRC  --   --   --  <0.10*  --  0.39  CREATININE 0.77 0.63  --   --  0.66  --   TROPONINIHS  --   --  12  --   --   --      Estimated Creatinine Clearance: 73.5 mL/min (by C-G formula based on SCr of 0.66 mg/dL).   Medical History: Past Medical History:  Diagnosis Date   AKI (acute kidney injury) ([place]) 12/26/2021   Arachnoid cyst    Brain aneurysm    Chronic anticoagulation 05/26/2017   Depression    History of DVT (deep vein thrombosis)    Hypercoagulable state ([place])    Hypertension    Hypothyroidism    Stroke ([place]) 2016   [facility] 11/29/14   Wheelchair bound     Medications: Rivaroxaban 20 mg PO Daily PTA -Last dose: 6/21  Assessment: Pt is a 38 yoF admitted with SBO and initially treated with conservative management. PMH significant for DVT in 2018, hypercoagulable state on Xarelto PTA.   Xarelto held on admission, was on enoxaparin for DVT ppx from 6/22 > 6/27. Heparin drip started on 6/27 but turned off @ 0020 this morning for possible surgery.   Per discussion with CCS and TRH, ok to resume heparin drip at this time. Surgery  likely will take place on 6/29, verbal order to turn off heparin at midnight tonight.   Today, 12/27/21 Heparin level 0.39 - therapeutic after heparin drip resumed at 1400 units/hr today @ 1609 Hg & PLT stable No bleeding reported  Goal of Therapy:  Heparin level 0.3-0.7 units/ml Monitor platelets by anticoagulation protocol: Yes   Plan:  Heparin drip to stop now @ midnight prior to surgery on 6/30 @ 1015 HL, CBC daily when on heparin drip Monitor for signs of bleeding F/u for post-op anticoag plans  [name], Pharm.D 12/27/2021 11:58 PM

## 2021-12-27 NOTE — Progress Notes (Signed)
Pharmacy Brief Note:  Pharmacy consulted to dose heparin drip for hx VTE. See full note by Leodis Sias, PharmD from earlier today.   Heparin was turned off @ 0500 this morning in anticipation for surgery. Surgery delayed until 6/30. Verbal order to resume heparin with no bolus and stop at midnight tonight per CCS.   Resume heparin at previous rate of 1400 units/hr. Check 6 hour HL. Heparin to stop on 6/30 @ 0000.   Lenis Noon, PharmD 12/27/21 2:17 PM

## 2021-12-27 NOTE — Plan of Care (Signed)
  Problem: Activity: Goal: Risk for activity intolerance will decrease Outcome: Progressing   

## 2021-12-27 NOTE — H&P (View-Only) (Signed)
Day of Surgery  Subjective: CC: No BMs.  Scant flatus.    Objective: Vital signs in last 24 hours: Temp:  [98 F (36.7 C)-99.8 F (37.7 C)] 98 F (36.7 C) (06/29 0533) Pulse Rate:  [106-121] 116 (06/29 0533) Resp:  [16-18] 18 (06/29 0533) BP: (105-154)/(78-96) 105/82 (06/29 0533) SpO2:  [97 %-100 %] 99 % (06/29 0533) Weight:  [84.1 kg] 84.1 kg (06/28 1230) Last BM Date : 12/20/21  Intake/Output from previous day: 06/28 0701 - 06/29 0700 In: 1910.1 [P.O.:20; I.V.:1890.1] Out: 1500 [Urine:900; Emesis/NG output:600] Intake/Output this shift: No intake/output data recorded.  PE: Gen:  Alert, NAD, pleasant Abd: Soft, distended, minimally tender.   NGT on LIWS with bilious output  Lab Results:  Recent Labs    12/26/21 0458 12/27/21 0433  WBC 6.6 8.4  HGB 10.2* 9.8*  HCT 30.0* 28.5*  PLT 248 239   BMET Recent Labs    12/26/21 0458 12/27/21 0433  NA 138 141  K 3.9 3.3*  CL 106 108  CO2 26 27  GLUCOSE 112* 108*  BUN 13 9  CREATININE 0.63 0.66  CALCIUM 7.9* 8.0*   PT/INR No results for input(s): "LABPROT", "INR" in the last 72 hours. CMP     Component Value Date/Time   NA 141 12/27/2021 0433   NA 133 (A) 12/14/2014 0000   K 3.3 (L) 12/27/2021 0433   CL 108 12/27/2021 0433   CO2 27 12/27/2021 0433   GLUCOSE 108 (H) 12/27/2021 0433   BUN 9 12/27/2021 0433   BUN 8 12/14/2014 0000   CREATININE 0.66 12/27/2021 0433   CREATININE 0.56 06/21/2021 1106   CALCIUM 8.0 (L) 12/27/2021 0433   PROT 6.9 12/27/2021 0433   ALBUMIN 2.1 (L) 12/27/2021 0433   AST 10 (L) 12/27/2021 0433   ALT 8 12/27/2021 0433   ALKPHOS 74 12/27/2021 0433   BILITOT 0.3 12/27/2021 0433   GFRNONAA >60 12/27/2021 0433   GFRAA >60 06/04/2017 0230   Lipase     Component Value Date/Time   LIPASE 44 12/20/2021 1306    Studies/Results: VAS [place] LOWER EXTREMITY VENOUS (DVT)  Result Date: 12/26/2021  Lower Venous DVT Study Patient Name:  DAJUANA PALEN  Date of Exam:   12/26/2021  Medical Rec #: [id number]        Accession #:    [id number] Date of Birth: 27-Oct-1955        Patient Gender: F Patient Age:   25 years Exam Location:  [facility] Procedure:      VAS [place] LOWER EXTREMITY VENOUS (DVT) Referring Phys: [place] --------------------------------------------------------------------------------  Indications: Swelling.  Risk Factors: DVT. Limitations: Poor ultrasound/tissue interface and patient positioning, patient immobility, patient pain tolerance. Comparison Study: 07/25/2016 - - Findings consistent with acute deep vein                   thrombosis involving the                   right common femoral vein, right profunda femoris vein, right                   femoral vein, right popliteal vein, right posterial tibial                   vein,                   and right peroneal vein.                   -  Findings consistent with acute superficial vein thrombosis                   involving the right small saphenous vein.                   - No evidence of deep vein thrombosis involving the left lower                   extremity.                   - No evidence of Baker&'s cyst on the right or left. Performing Technologist: Oliver Hum RVT  Examination Guidelines: A complete evaluation includes B-mode imaging, spectral Doppler, color Doppler, and power Doppler as needed of all accessible portions of each vessel. Bilateral testing is considered an integral part of a complete examination. Limited examinations for reoccurring indications may be performed as noted. The reflux portion of the exam is performed with the patient in reverse Trendelenburg.  +---------+---------------+---------+-----------+----------+--------------+ RIGHT    CompressibilityPhasicitySpontaneityPropertiesThrombus Aging +---------+---------------+---------+-----------+----------+--------------+ CFV      Partial        Yes      Yes                  Chronic         +---------+---------------+---------+-----------+----------+--------------+ SFJ      Full                                                        +---------+---------------+---------+-----------+----------+--------------+ FV Prox  Full                                                        +---------+---------------+---------+-----------+----------+--------------+ FV Mid                  Yes      Yes                                 +---------+---------------+---------+-----------+----------+--------------+ FV DistalFull                                                        +---------+---------------+---------+-----------+----------+--------------+ PFV      Full                                                        +---------+---------------+---------+-----------+----------+--------------+ POP      Full           Yes      Yes                                 +---------+---------------+---------+-----------+----------+--------------+ PTV      Full                                                        +---------+---------------+---------+-----------+----------+--------------+  PERO     Full                                                        +---------+---------------+---------+-----------+----------+--------------+   +---------+---------------+---------+-----------+----------+-------------------+ LEFT     CompressibilityPhasicitySpontaneityPropertiesThrombus Aging      +---------+---------------+---------+-----------+----------+-------------------+ CFV      Full           Yes      Yes                                      +---------+---------------+---------+-----------+----------+-------------------+ SFJ      Full                                                             +---------+---------------+---------+-----------+----------+-------------------+ FV Prox  Full                                                              +---------+---------------+---------+-----------+----------+-------------------+ FV Mid   Full                                                             +---------+---------------+---------+-----------+----------+-------------------+ FV Distal               Yes      Yes                                      +---------+---------------+---------+-----------+----------+-------------------+ PFV      Full                                                             +---------+---------------+---------+-----------+----------+-------------------+ POP      Full           Yes      Yes                                      +---------+---------------+---------+-----------+----------+-------------------+ PTV      Full                                                             +---------+---------------+---------+-----------+----------+-------------------+ PERO  Not well visualized +---------+---------------+---------+-----------+----------+-------------------+     Summary: RIGHT: - 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 - No cystic structure found in the popliteal fossa.  LEFT: - There is no evidence of deep vein thrombosis in the lower extremity. However, portions of this examination were limited- see technologist comments above.  - No cystic structure found in the popliteal fossa.  *See table(s) above for measurements and observations. Electronically signed by Orlie Pollen on 12/26/2021 at 5:31:11 PM.    Final    ECHOCARDIOGRAM COMPLETE  Result Date: 12/26/2021    ECHOCARDIOGRAM REPORT   Patient Name:   MERIA CRILLY Date of Exam: 12/26/2021 Medical Rec #:  [id number]       Height:       66.0 in Accession #:    [id number]      Weight:       177.2 lb Date of Birth:  1955/12/12       BSA:          1.900 m Patient Age:    77 years        BP:           125/82 mmHg Patient Gender: F                HR:           120 bpm. Exam Location:  Inpatient Procedure: 2D Echo, Color Doppler and Cardiac Doppler Indications:    Abnormal ekg  History:        Patient has no prior history of Echocardiogram examinations.  Sonographer:    Joette Catching RCS Referring Phys: [id number] [place]  Sonographer Comments: Technically challenging study due to limited acoustic windows. Supine scan. IMPRESSIONS  1. Left ventricular ejection fraction, by estimation, is 30 to 35%. The left ventricle has moderately decreased function. The left ventricle demonstrates global hypokinesis. Left ventricular diastolic parameters are consistent with Grade I diastolic dysfunction (impaired relaxation).  2. Right ventricular systolic function is normal. The right ventricular size is normal.  3. The mitral valve is normal in structure. No evidence of mitral valve regurgitation. No evidence of mitral stenosis.  4. The aortic valve is calcified. There is mild calcification of the aortic valve. There is mild thickening of the aortic valve. Aortic valve regurgitation is not visualized. Aortic valve sclerosis is present, with no evidence of aortic valve stenosis.  5. The inferior vena cava is normal in size with greater than 50% respiratory variability, suggesting right atrial pressure of 3 mmHg. FINDINGS  Left Ventricle: Left ventricular ejection fraction, by estimation, is 30 to 35%. The left ventricle has moderately decreased function. The left ventricle demonstrates global hypokinesis. The left ventricular internal cavity size was normal in size. There is no left ventricular hypertrophy. Abnormal (paradoxical) septal motion, consistent with left bundle branch block. Left ventricular diastolic parameters are consistent with Grade I diastolic dysfunction (impaired relaxation). Right Ventricle: The right ventricular size is normal. No increase in right ventricular wall thickness. Right ventricular systolic function is normal. Left Atrium:  Left atrial size was normal in size. Right Atrium: Right atrial size was normal in size. Pericardium: There is no evidence of pericardial effusion. Mitral Valve: The mitral valve is normal in structure. No evidence of mitral valve regurgitation. No evidence of mitral valve stenosis. Tricuspid Valve: The tricuspid valve is normal in structure. Tricuspid valve regurgitation is not demonstrated. No evidence of tricuspid stenosis. Aortic Valve: The aortic valve is calcified. There is  mild calcification of the aortic valve. There is mild thickening of the aortic valve. Aortic valve regurgitation is not visualized. Aortic valve sclerosis is present, with no evidence of aortic valve stenosis. Aortic valve mean gradient measures 4.0 mmHg. Aortic valve peak gradient measures 6.2 mmHg. Aortic valve area, by VTI measures 2.26 cm. Pulmonic Valve: The pulmonic valve was normal in structure. Pulmonic valve regurgitation is not visualized. No evidence of pulmonic stenosis. Aorta: The aortic root is normal in size and structure. Venous: The inferior vena cava is normal in size with greater than 50% respiratory variability, suggesting right atrial pressure of 3 mmHg. IAS/Shunts: No atrial level shunt detected by color flow Doppler.  LEFT VENTRICLE PLAX 2D LVIDd:         3.80 cm   Diastology LVIDs:         3.30 cm   LV e' medial:    5.22 cm/s LV PW:         1.00 cm   LV E/e' medial:  10.0 LV IVS:        0.90 cm   LV e' lateral:   5.77 cm/s LVOT diam:     2.30 cm   LV E/e' lateral: 9.1 LV SV:         30 LV SV Index:   16 LVOT Area:     4.15 cm  RIGHT VENTRICLE             IVC RV S prime:     29.70 cm/s  IVC diam: 1.70 cm TAPSE (M-mode): 2.1 cm LEFT ATRIUM             Index LA diam:        2.10 cm 1.11 cm/m LA Vol (A2C):   20.0 ml 10.53 ml/m LA Vol (A4C):   57.6 ml 30.32 ml/m LA Biplane Vol: 35.8 ml 18.84 ml/m  AORTIC VALVE                    PULMONIC VALVE AV Area (Vmax):    2.08 cm     PV Vmax:          1.15 m/s AV Area  (Vmean):   2.10 cm     PV Peak grad:     5.3 mmHg AV Area (VTI):     2.26 cm     PR End Diast Vel: 10.76 msec AV Vmax:           125.00 cm/s AV Vmean:          94.000 cm/s AV VTI:            0.134 m AV Peak Grad:      6.2 mmHg AV Mean Grad:      4.0 mmHg LVOT Vmax:         62.70 cm/s LVOT Vmean:        47.600 cm/s LVOT VTI:          0.073 m LVOT/AV VTI ratio: 0.54  AORTA Ao Root diam: 3.40 cm Ao Asc diam:  3.30 cm MITRAL VALVE               TRICUSPID VALVE MV Area (PHT): 5.84 cm    TR Peak grad:   25.0 mmHg MV Decel Time: 130 msec    TR Vmax:        250.00 cm/s MV E velocity: 52.30 cm/s MV A velocity: 78.40 cm/s  SHUNTS MV E/A ratio:  0.67  Systemic VTI:  0.07 m                            Systemic Diam: 2.30 cm Candee Furbish MD Electronically signed by Candee Furbish MD Signature Date/Time: 12/26/2021/5:05:24 PM    Final    [place] EKG SITE RITE  Result Date: 12/26/2021 If Site Rite image not attached, placement could not be confirmed due to current cardiac rhythm.  DG Abd Portable 1V  Result Date: 12/26/2021 CLINICAL DATA:  Small-bowel obstruction EXAM: PORTABLE ABDOMEN - 1 VIEW COMPARISON:  12/25/2021 FINDINGS: Persistent dilated loops of small bowel particularly in the left abdomen. Maximum measurable distension is decreased. Calcified uterine fibroid is again noted. IMPRESSION: Persistent small bowel obstruction. Maximum distension measures decreased. Electronically Signed   By: Macy Mis M.D.   On: 12/26/2021 08:24   DG Abd Portable 1V  Result Date: 12/25/2021 CLINICAL DATA:  Small bowel obstruction. EXAM: PORTABLE ABDOMEN - 1 VIEW COMPARISON:  Abdominal x-ray from yesterday. FINDINGS: Enteric tube incompletely visualized looped in the left chest, presumably within the large hiatal hernia as seen on CT. Multiple dilated loops of small bowel are unchanged. Calcified uterine fibroid again noted. No acute osseous abnormality. IMPRESSION: 1. Unchanged small bowel obstruction. Electronically Signed    By: Titus Dubin M.D.   On: 12/25/2021 10:31    Anti-infectives: Anti-infectives (From admission, onward)    None        Assessment/Plan SBO - no resolution. Surgery planned today if cardiology OK.  EF decreased on yesterday's echo.  Will hold heparin gtt in anticipation of surgery.    FEN - NPO. NGT to LIWS. IVFs per TRH.  VTE - SCDs, hep gtt on hold ID - prophylactic antibiotic for surgery only.     - per TRH -  Tachycardia  HTN Hx CVA Wheelchair bound Hypercoagulable state H/O DVT on Xarelto (held) Hypothyroidism HLD IDA GERD   I reviewed hospitalist notes, last 24 h vitals and pain scores, last 48 h intake and output, last 24 h labs and trends, and last 24 h imaging results.     LOS: 7 days    Milus Height, MD FACS Surgical Oncology, General Surgery, Trauma and [facility], [place] for weekday/non holidays Check [email or web address] for coverage night/weekend/holidays  Do not use SecureChat as it is not reliable for timely patient care.

## 2021-12-27 NOTE — Progress Notes (Addendum)
PHARMACY - TOTAL PARENTERAL NUTRITION CONSULT NOTE   Indication: Small bowel obstruction  Patient Measurements: Height: '5\' 6"'$  (167.6 cm) Weight: 84.1 kg (185 lb 6.5 oz) IBW/kg (Calculated) : 59.3 TPN AdjBW (KG): 64.4 Body mass index is 29.93 kg/m.  Assessment:  66 yo F presents with abdominal pain, nausea, vomiting, and poor appetite x 5 days. 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 No BM for 5 days as well. NG output was 1.6 L and xray shows SBO. Going for ex lap, lysis of adhesions and possible bowel resection on 6/29. Does report shellfish allergy but not any fish. Reacted to shrimp but not crab and was not an anaphylactic reaction. Pharmacy consulted for TPN.  Glucose / Insulin: No hx of DM. AM CBGs ok. Electrolytes: K and phos low, others WNL. Renal: SCr wnl. Hepatic:  LFTs and Trig ok. Albumin low at 2.1. Intake / Output; MIVF:  GI Imaging: SBO GI Surgeries / Procedures: Ex lap with possible bowel resection 6/29  Central access: PICC planned for 6/29. Patient deferred yesterday TPN start date: 6/29  Nutritional Goals: Goal TPN rate is 80 mL/hr (provides 90 g of protein and 2,085 kcals per day)  RD Assessment: Estimated Needs Total Energy Estimated Needs: 1950-2150 Total Protein Estimated Needs: 85-95g Total Fluid Estimated Needs: 2L/day  Current Nutrition:  NPO  Plan:  Give KPhos 69mol x 1 this am  Start TPN at 461mhr at 1800. Will advance to goal as tolerated. Electrolytes in TPN: Na 5022mL, K 58m17m, Ca 5mEq82m Mg 5mEq/5mand Phos 15mmol53mCl:Ac 1:2 Add standard MVI and trace elements to TPN Initiate Sensitive q6h SSI and adjust as needed  Reduce D51/2NS to KVO at [facility]0 Monitor TPN labs on Mon/Thurs, recheck Bmet and Phos tomorrow F/U plans after surgery today  Elliott Lasecki Elenor QuinonesD, BCPS, BCIDP Clinical Pharmacist 12/27/2021 8:09 AM

## 2021-12-27 NOTE — Consult Note (Signed)
Cardiology Consultation:   Patient ID: Katrina Donaldson MRN: [id number]; DOB: 09-24-1955  Admit date: 12/20/2021 Date of Consult: 12/27/2021  PCP:  Donald Prose, MD   [facility] Providers Cardiologist:  Skeet Latch, MD new  Patient Profile:   Katrina Donaldson is a 66 y.o. 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 Antonieta Pert.  History of Present Illness:   Katrina Donaldson has no prior cardiac history. She is wheelchair bound and resides at a SNF. She was hospitalized 01/2021 with Hb 3.0 found to have a cecal mass and underwent partial colectomy.  She is on xarelto for hx of DVT found in 2018.   Stroke/SAH/aneurysm in 2016 with echocardiogram that showed LVEF 60-65% no RWMA.   She was brought to [facility] with 5 days of anorexia, abdominal pain and N/V. She was admitted 12/20/21 with high grade SBO felt secondary to adhesions from prior abdominal surgery. She is NPO with NG tube. Hypertension treated with 25 mg metoprolol BID on hold. EKG with ST depression inferior leads, TWI throughout. HS troponin negative. Echo ordered.  Echocardiogram obtained and shows newly recognized reduced EF of 30-35%, grade 1 DD, normal RV, and no significant valvular disease.   She states her first DVT was in 2005 and has been "on and off" anticoagulation. She had a stroke in 2016. She has hemiplegia since a back surgery, unclear date.   She denies chest pain, dyspnea, swelling, and orthopnea. Overall, she does not want to participate in an interview.    Past Medical History:  Diagnosis Date   AKI (acute kidney injury) ([place]) 12/26/2021   Arachnoid cyst    Brain aneurysm    Chronic anticoagulation 05/26/2017   Depression    History of DVT (deep vein thrombosis)    Hypercoagulable  state ([place])    Hypertension    Hypothyroidism    Stroke [facility]) 2016   [facility] 11/29/14   Wheelchair bound     Past Surgical History:  Procedure Laterality Date   ABDOMINAL HYSTERECTOMY     patient denies   BIOPSY  01/31/2021   Procedure: BIOPSY;  Surgeon: Jerene Bears, MD;  Location: [facility] ENDOSCOPY;  Service: Gastroenterology;;   BRAIN SURGERY     aneurysm   COLONOSCOPY WITH PROPOFOL N/A 01/31/2021   Procedure: COLONOSCOPY WITH PROPOFOL;  Surgeon: Jerene Bears, MD;  Location: [place];  Service: Gastroenterology;  Laterality: N/A;   ESOPHAGOGASTRODUODENOSCOPY (EGD) WITH PROPOFOL N/A 01/29/2021   Procedure: ESOPHAGOGASTRODUODENOSCOPY (EGD) WITH PROPOFOL;  Surgeon: Jerene Bears, MD;  Location: [facility] ENDOSCOPY;  Service: Gastroenterology;  Laterality: N/A;   goiter     LAMINECTOMY N/A 07/19/2016   Procedure: LAMINECTOMY THORACIC FOUR THORACIC FIVE, THORACIC SIX TO THORACIC EIGHT,THORACIC TEN TO LUMBAR ONE, FENESTRATION OF ARACHNOID CYSTS;  Surgeon: Consuella Lose, MD;  Location: [place];  Service: Neurosurgery;  Laterality: N/A;   LAMINECTOMY N/A 06/03/2017   Procedure: THORACIC THREE - THORACIC FIVE LAMINECTOMY, FENESTRATION OF ARACHNOID CYST;  Surgeon: Consuella Lose, MD;  Location: [place];  Service: Neurosurgery;  Laterality: N/A;   LAPAROSCOPIC PARTIAL COLECTOMY N/A 02/01/2021   Procedure: LAPAROSCOPIC ASSISTED PARTIAL COLECTOMY;  Surgeon: Georganna Skeans, MD;  Location: [place];  Service: General;  Laterality: N/A;   THYROIDECTOMY     TUBAL LIGATION  Home Medications:  Prior to Admission medications   Medication Sig Start Date End Date Taking? Authorizing Provider  acetaminophen (TYLENOL) 325 MG tablet Take 2 tablets (650 mg total) by mouth every 6 (six) hours as needed for mild pain, fever or headache. Patient taking differently: Take 650 mg by mouth daily as needed (for pain- cannot exceed 3,000 mg in 24 hours). 02/13/21  Yes Elgergawy, Silver Huguenin, MD  Amino Acids-Protein Hydrolys  (PRO-STAT AWC) LIQD Take 30 mLs by mouth 3 (three) times daily.   Yes [provider]  antiseptic oral rinse (BIOTENE) LIQD 30 mLs by Mouth Rinse route daily as needed for dry mouth.   Yes [provider]  ARTIFICIAL TEARS 0.2-0.2-1 % SOLN Place 1 drop into both eyes every 2 (two) hours as needed (for dryness/irritation).   Yes [provider]  atorvastatin (LIPITOR) 10 MG tablet Take 5 mg by mouth at bedtime.   Yes [provider]  azelastine (ASTELIN) 0.1 % nasal spray Place 2 sprays into both nostrils 2 (two) times daily as needed for rhinitis or allergies.   Yes [provider]  baclofen (LIORESAL) 10 MG tablet Take 5-10 mg by mouth See admin instructions. Take 5 mg (1/2 tablet) by mouth in the morning and an additional 10 mg by mouth in the afternoon as needed for muscle spasms 05/09/17  Yes [provider]  Baclofen 5 MG TABS Take 5 mg by mouth See admin instructions. Take 5 mg by mouth in the evening as needed for spasms   Yes [provider]  bisacodyl (DULCOLAX) 5 MG EC tablet Take 2 tablets (10 mg total) by mouth daily as needed for moderate constipation. Patient taking differently: Take 10 mg by mouth daily as needed (for constipation). 02/13/21  Yes Elgergawy, Silver Huguenin, MD  Dextran 70-Hypromellose (GENTEAL TEARS) 0.1-0.3 % SOLN Place 2 drops into both eyes at bedtime.   Yes [provider]  ferrous gluconate (FERGON) 324 MG tablet Take 324 mg by mouth every other day.   Yes [provider]  fluticasone (FLONASE) 50 MCG/ACT nasal spray Place 1 spray into both nostrils 2 (two) times daily.   Yes [provider]  gabapentin (NEURONTIN) 100 MG capsule Take 100 mg by mouth as needed (for neuropathic pain).   Yes [provider]  gabapentin (NEURONTIN) 300 MG capsule Take 300 mg by mouth 2 (two) times daily. 07/09/21  Yes [provider]  gabapentin (NEURONTIN) 400 MG capsule Take 400 mg by  mouth daily.   Yes [provider]  Glycerin-Hypromellose-PEG 400 (ARTIFICIAL TEARS) 0.2-0.2-1 % SOLN Place 1 drop into both eyes every 2 (two) hours as needed (for dryness).   Yes [provider]  hydrocortisone cream 1 % Apply 1 Application topically 2 (two) times daily as needed for itching.   Yes [provider]  hydrOXYzine (ATARAX/VISTARIL) 25 MG tablet Take 1 tablet (25 mg total) by mouth 3 (three) times daily. Patient taking differently: Take 25 mg by mouth 2 (two) times daily. 02/13/21  Yes Elgergawy, Silver Huguenin, MD  levothyroxine (SYNTHROID) 125 MCG tablet Take 125 mcg by mouth daily before breakfast.   Yes [provider]  Menthol, Topical Analgesic, (BIOFREEZE) 4 % GEL Apply 1 application  topically See admin instructions. Apply to shoulders and legs 2 times a day   Yes [provider]  metoprolol tartrate (LOPRESSOR) 25 MG tablet Take 1 tablet (25 mg total) by mouth 2 (two) times daily. 02/13/21  Yes Elgergawy, Emeline Gins  S, MD  Multiple Vitamin (MULTIVITAMIN WITH MINERALS) TABS tablet Take 1 tablet by mouth daily. Patient taking differently: Take 1 tablet by mouth daily with breakfast. 02/13/21  Yes Elgergawy, Silver Huguenin, MD  pantoprazole (PROTONIX) 40 MG tablet Take 1 tablet (40 mg total) by mouth 2 (two) times daily. 02/13/21  Yes Elgergawy, Silver Huguenin, MD  PARoxetine (PAXIL) 10 MG tablet Take 10 mg by mouth in the morning.   Yes [provider]  polyethylene glycol powder (GLYCOLAX/MIRALAX) 17 GM/SCOOP powder Take 17 g by mouth daily as needed for mild constipation (TO BE MIXED WITH 6 OUNCES OF WATER).   Yes [provider]  rivaroxaban (XARELTO) 20 MG TABS tablet Take 1 tablet (20 mg total) by mouth daily with supper. Patient taking differently: Take 20 mg by mouth at bedtime. 08/23/16  Yes Angiulli, Lavon Paganini, PA-C  saccharomyces boulardii (FLORASTOR) 250 MG capsule Take 250 mg by mouth every other day.   Yes [provider]   sennosides-docusate sodium (SENOKOT-S) 8.6-50 MG tablet Take 2 tablets by mouth daily.   Yes [provider]  Simethicone 180 MG CAPS Take 360 mg by mouth daily as needed (for gas).   Yes [provider]  sodium chloride (OCEAN) 0.65 % SOLN nasal spray Place 2 sprays into both nostrils 3 (three) times daily as needed for congestion.   Yes [provider]  feeding supplement (ENSURE ENLIVE / ENSURE PLUS) LIQD Take 237 mLs by mouth 3 (three) times daily between meals. Patient not taking: Reported on 12/20/2021 02/13/21   Elgergawy, Silver Huguenin, MD    Inpatient Medications: Scheduled Meds:  diatrizoate meglumine-sodium  90 mL Per NG tube Once   fluticasone  1 spray Each Nare BID   levothyroxine  100 mcg Intravenous Daily   pantoprazole (PROTONIX) IV  40 mg Intravenous Q12H   Continuous Infusions:  dextrose 5 % and 0.45% NaCl 50 mL/hr at 12/27/21 0333   potassium PHOSPHATE IVPB (in mmol)     PRN Meds: acetaminophen **OR** acetaminophen, albuterol, azelastine, hydrALAZINE, HYDROcodone-acetaminophen, hydrocortisone, lip balm, metoprolol tartrate, morphine injection, ondansetron **OR** ondansetron (ZOFRAN) IV, phenol, polyethylene glycol, polyvinyl alcohol, sodium chloride  Allergies:    Allergies  Allergen Reactions   Shellfish-Derived Products Other (See Comments)    ALLERGIC TO SHRIMP!! Made the tongue "feel strange" Per patient, she has since eaten crab legs and did not have another reaction.     Social History:   Social History   Socioeconomic History   Marital status: Widowed    Spouse name: Not on file   Number of children: Not on file   Years of education: Not on file   Highest education level: Not on file  Occupational History   Not on file  Tobacco Use   Smoking status: Never   Smokeless tobacco: Never  Vaping Use   Vaping Use: Never used  Substance and Sexual Activity   Alcohol use: No    Alcohol/week: 0.0 standard drinks of alcohol   Drug  use: No   Sexual activity: Not Currently  Other Topics Concern   Not on file  Social History Narrative   Not on file   Social Determinants of Health   Financial Resource Strain: Not on file  Food Insecurity: Not on file  Transportation Needs: Not on file  Physical Activity: Not on file  Stress: Not on file  Social Connections: Not on file  Intimate Partner Violence: Not on file    Family History:   History reviewed.  No pertinent family history.   ROS:  Please see the history of present illness.   All other ROS reviewed and negative.     Physical Exam/Data:   Vitals:   12/26/21 1650 12/26/21 2112 12/27/21 0154 12/27/21 0533  BP: 121/82 127/83 137/78 105/82  Pulse: (!) 106 (!) 121 (!) 109 (!) 116  Resp: '18 18 16 18  '$ Temp: 99.8 F (37.7 C) 98.4 F (36.9 C) 99 F (37.2 C) 98 F (36.7 C)  TempSrc: Oral Oral Oral Oral  SpO2: 97% 100% 100% 99%  Weight:      Height:        Intake/Output Summary (Last 24 hours) at 12/27/2021 0755 Last data filed at 12/27/2021 0507 Gross per 24 hour  Intake 1910.09 ml  Output 1500 ml  Net 410.09 ml      12/26/2021   12:30 PM 12/23/2021    6:00 AM 12/20/2021    6:23 PM  Last 3 Weights  Weight (lbs) 185 lb 6.5 oz 177 lb 4 oz 175 lb 11.3 oz  Weight (kg) 84.1 kg 80.4 kg 79.7 kg     Body mass index is 29.93 kg/m.  General:  Well nourished, well developed, in no acute distress HEENT: normal Neck: no JVD Vascular: No carotid bruits; Distal pulses 2+ bilaterally Cardiac:  regular rhythm, tachycardic rate Lungs:  respirations unlabored, no crackles on back  Abd: diminished BS, distended Ext: no edema Musculoskeletal:  No deformities, BUE and BLE strength normal and equal Skin: warm and dry  Psych:  Normal affect   EKG:  The EKG was personally reviewed and demonstrates:  sinus tachycardia with HR 121, ST depression inferior leads, TWI throughout Telemetry:  Telemetry was personally reviewed and demonstrates:  sinus tachycardia with  PACs, HR 100-130s  Relevant CV Studies:  Echo 12/26/21:  1. Left ventricular ejection fraction, by estimation, is 30 to 35%. The  left ventricle has moderately decreased function. The left ventricle  demonstrates global hypokinesis. Left ventricular diastolic parameters are  consistent with Grade I diastolic  dysfunction (impaired relaxation).   2. Right ventricular systolic function is normal. The right ventricular  size is normal.   3. The mitral valve is normal in structure. No evidence of mitral valve  regurgitation. No evidence of mitral stenosis.   4. The aortic valve is calcified. There is mild calcification of the  aortic valve. There is mild thickening of the aortic valve. Aortic valve  regurgitation is not visualized. Aortic valve sclerosis is present, with  no evidence of aortic valve stenosis.   5. The inferior vena cava is normal in size with greater than 50%  respiratory variability, suggesting right atrial pressure of 3 mmHg.   Laboratory Data:  High Sensitivity Troponin:   Recent Labs  Lab 12/26/21 1658  TROPONINIHS 12     Chemistry Recent Labs  Lab 12/22/21 0515 12/23/21 0453 12/24/21 0228 12/25/21 0439 12/26/21 0458 12/27/21 0433  NA 145   < > 145 142 138 141  K 3.4*   < > 3.0* 3.3* 3.9 3.3*  CL 111   < > 111 108 106 108  CO2 19*   < > '27 28 26 27  '$ GLUCOSE 84   < > 130* 162* 112* 108*  BUN 33*   < > '19 16 13 9  '$ CREATININE 1.03*   < > 0.74 0.77 0.63 0.66  CALCIUM 8.2*   < > 8.7* 8.2* 7.9* 8.0*  MG 2.2  --  2.2  --   --  1.8  GFRNONAA 59*   < > >60 >60 >60 >60  ANIONGAP 15   < > '7 6 6 6   '$ < > = values in this interval not displayed.    Recent Labs  Lab 12/20/21 1306 12/21/21 0507 12/27/21 0433  PROT 8.5* 8.1 6.9  ALBUMIN 3.1* 3.0* 2.1*  AST 15 14* 10*  ALT '11 11 8  '$ ALKPHOS 88 82 74  BILITOT 0.6 0.9 0.3   Lipids  Recent Labs  Lab 12/27/21 0433  TRIG 76    Hematology Recent Labs  Lab 12/25/21 0439 12/26/21 0458 12/27/21 0433  WBC  6.7 6.6 8.4  RBC 3.84* 3.86* 3.70*  HGB 10.1* 10.2* 9.8*  HCT 29.6* 30.0* 28.5*  MCV 77.1* 77.7* 77.0*  MCH 26.3 26.4 26.5  MCHC 34.1 34.0 34.4  RDW 18.3* 18.6* 17.4*  PLT 257 248 239   Thyroid No results for input(s): "TSH", "FREET4" in the last 168 hours.  BNPNo results for input(s): "BNP", "PROBNP" in the last 168 hours.  DDimer No results for input(s): "DDIMER" in the last 168 hours.   Radiology/Studies:  VAS [place] LOWER EXTREMITY VENOUS (DVT)  Result Date: 12/26/2021  Lower Venous DVT Study Patient Name:  JALAYIA BAGHERI  Date of Exam:   12/26/2021 Medical Rec #: [id number]        Accession #:    [id number] Date of Birth: [birth year]        Patient Gender: F Patient Age:   38 years Exam Location:  [facility] Procedure:      VAS [place] LOWER EXTREMITY VENOUS (DVT) Referring Phys: [place] --------------------------------------------------------------------------------  Indications: Swelling.  Risk Factors: DVT. Limitations: Poor ultrasound/tissue interface and patient positioning, patient immobility, patient pain tolerance. Comparison Study: 07/25/2016 - - Findings consistent with acute deep vein                   thrombosis involving the                   right common femoral vein, right profunda femoris vein, right                   femoral vein, right popliteal vein, right posterial tibial                   vein,                   and right peroneal vein.                   - Findings consistent with acute superficial vein thrombosis                   involving the right small saphenous vein.                   - No evidence of deep vein thrombosis involving the left lower                   extremity.                   - No evidence of Baker&'s cyst on the right or left. Performing Technologist: Oliver Hum RVT  Examination Guidelines: A complete evaluation includes B-mode imaging, spectral Doppler, color Doppler, and power Doppler as needed of all accessible portions of each vessel.  Bilateral testing is considered an integral part of a complete examination. Limited examinations for reoccurring indications may be  performed as noted. The reflux portion of the exam is performed with the patient in reverse Trendelenburg.  +---------+---------------+---------+-----------+----------+--------------+ RIGHT    CompressibilityPhasicitySpontaneityPropertiesThrombus Aging +---------+---------------+---------+-----------+----------+--------------+ CFV      Partial        Yes      Yes                  Chronic        +---------+---------------+---------+-----------+----------+--------------+ SFJ      Full                                                        +---------+---------------+---------+-----------+----------+--------------+ FV Prox  Full                                                        +---------+---------------+---------+-----------+----------+--------------+ FV Mid                  Yes      Yes                                 +---------+---------------+---------+-----------+----------+--------------+ FV DistalFull                                                        +---------+---------------+---------+-----------+----------+--------------+ PFV      Full                                                        +---------+---------------+---------+-----------+----------+--------------+ POP      Full           Yes      Yes                                 +---------+---------------+---------+-----------+----------+--------------+ PTV      Full                                                        +---------+---------------+---------+-----------+----------+--------------+ PERO     Full                                                        +---------+---------------+---------+-----------+----------+--------------+   +---------+---------------+---------+-----------+----------+-------------------+ LEFT      CompressibilityPhasicitySpontaneityPropertiesThrombus Aging      +---------+---------------+---------+-----------+----------+-------------------+ CFV      Full           Yes      Yes                                      +---------+---------------+---------+-----------+----------+-------------------+  SFJ      Full                                                             +---------+---------------+---------+-----------+----------+-------------------+ FV Prox  Full                                                             +---------+---------------+---------+-----------+----------+-------------------+ FV Mid   Full                                                             +---------+---------------+---------+-----------+----------+-------------------+ FV Distal               Yes      Yes                                      +---------+---------------+---------+-----------+----------+-------------------+ PFV      Full                                                             +---------+---------------+---------+-----------+----------+-------------------+ POP      Full           Yes      Yes                                      +---------+---------------+---------+-----------+----------+-------------------+ PTV      Full                                                             +---------+---------------+---------+-----------+----------+-------------------+ PERO                                                  Not well visualized +---------+---------------+---------+-----------+----------+-------------------+     Summary: RIGHT: - 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 - No cystic structure found in the popliteal fossa.  LEFT: - There is no evidence of deep vein thrombosis in the lower extremity. However, portions of this examination were limited- see technologist comments above.  - No  cystic structure found in the popliteal fossa.  *See table(s) above for measurements and observations. Electronically signed by Orlie Pollen on 12/26/2021 at 5:31:11 PM.    Final  ECHOCARDIOGRAM COMPLETE  Result Date: 12/26/2021    ECHOCARDIOGRAM REPORT   Patient Name:   KINZLY PIERRELOUIS Date of Exam: 12/26/2021 Medical Rec #:  [id number]       Height:       66.0 in Accession #:    [id number]      Weight:       177.2 lb Date of Birth:  05-23-1956       BSA:          1.900 m Patient Age:    28 years        BP:           125/82 mmHg Patient Gender: F               HR:           120 bpm. Exam Location:  Inpatient Procedure: 2D Echo, Color Doppler and Cardiac Doppler Indications:    Abnormal ekg  History:        Patient has no prior history of Echocardiogram examinations.  Sonographer:    Joette Catching RCS Referring Phys: [id number] [place]  Sonographer Comments: Technically challenging study due to limited acoustic windows. Supine scan. IMPRESSIONS  1. Left ventricular ejection fraction, by estimation, is 30 to 35%. The left ventricle has moderately decreased function. The left ventricle demonstrates global hypokinesis. Left ventricular diastolic parameters are consistent with Grade I diastolic dysfunction (impaired relaxation).  2. Right ventricular systolic function is normal. The right ventricular size is normal.  3. The mitral valve is normal in structure. No evidence of mitral valve regurgitation. No evidence of mitral stenosis.  4. The aortic valve is calcified. There is mild calcification of the aortic valve. There is mild thickening of the aortic valve. Aortic valve regurgitation is not visualized. Aortic valve sclerosis is present, with no evidence of aortic valve stenosis.  5. The inferior vena cava is normal in size with greater than 50% respiratory variability, suggesting right atrial pressure of 3 mmHg. FINDINGS  Left Ventricle: Left ventricular ejection fraction, by estimation, is 30 to 35%. The left  ventricle has moderately decreased function. The left ventricle demonstrates global hypokinesis. The left ventricular internal cavity size was normal in size. There is no left ventricular hypertrophy. Abnormal (paradoxical) septal motion, consistent with left bundle branch block. Left ventricular diastolic parameters are consistent with Grade I diastolic dysfunction (impaired relaxation). Right Ventricle: The right ventricular size is normal. No increase in right ventricular wall thickness. Right ventricular systolic function is normal. Left Atrium: Left atrial size was normal in size. Right Atrium: Right atrial size was normal in size. Pericardium: There is no evidence of pericardial effusion. Mitral Valve: The mitral valve is normal in structure. No evidence of mitral valve regurgitation. No evidence of mitral valve stenosis. Tricuspid Valve: The tricuspid valve is normal in structure. Tricuspid valve regurgitation is not demonstrated. No evidence of tricuspid stenosis. Aortic Valve: The aortic valve is calcified. There is mild calcification of the aortic valve. There is mild thickening of the aortic valve. Aortic valve regurgitation is not visualized. Aortic valve sclerosis is present, with no evidence of aortic valve stenosis. Aortic valve mean gradient measures 4.0 mmHg. Aortic valve peak gradient measures 6.2 mmHg. Aortic valve area, by VTI measures 2.26 cm. Pulmonic Valve: The pulmonic valve was normal in structure. Pulmonic valve regurgitation is not visualized. No evidence of pulmonic stenosis. Aorta: The aortic root is normal in size and structure. Venous: The inferior vena cava is normal in size with greater  than 50% respiratory variability, suggesting right atrial pressure of 3 mmHg. IAS/Shunts: No atrial level shunt detected by color flow Doppler.  LEFT VENTRICLE PLAX 2D LVIDd:         3.80 cm   Diastology LVIDs:         3.30 cm   LV e' medial:    5.22 cm/s LV PW:         1.00 cm   LV E/e' medial:   10.0 LV IVS:        0.90 cm   LV e' lateral:   5.77 cm/s LVOT diam:     2.30 cm   LV E/e' lateral: 9.1 LV SV:         30 LV SV Index:   16 LVOT Area:     4.15 cm  RIGHT VENTRICLE             IVC RV S prime:     29.70 cm/s  IVC diam: 1.70 cm TAPSE (M-mode): 2.1 cm LEFT ATRIUM             Index LA diam:        2.10 cm 1.11 cm/m LA Vol (A2C):   20.0 ml 10.53 ml/m LA Vol (A4C):   57.6 ml 30.32 ml/m LA Biplane Vol: 35.8 ml 18.84 ml/m  AORTIC VALVE                    PULMONIC VALVE AV Area (Vmax):    2.08 cm     PV Vmax:          1.15 m/s AV Area (Vmean):   2.10 cm     PV Peak grad:     5.3 mmHg AV Area (VTI):     2.26 cm     PR End Diast Vel: 10.76 msec AV Vmax:           125.00 cm/s AV Vmean:          94.000 cm/s AV VTI:            0.134 m AV Peak Grad:      6.2 mmHg AV Mean Grad:      4.0 mmHg LVOT Vmax:         62.70 cm/s LVOT Vmean:        47.600 cm/s LVOT VTI:          0.073 m LVOT/AV VTI ratio: 0.54  AORTA Ao Root diam: 3.40 cm Ao Asc diam:  3.30 cm MITRAL VALVE               TRICUSPID VALVE MV Area (PHT): 5.84 cm    TR Peak grad:   25.0 mmHg MV Decel Time: 130 msec    TR Vmax:        250.00 cm/s MV E velocity: 52.30 cm/s MV A velocity: 78.40 cm/s  SHUNTS MV E/A ratio:  0.67        Systemic VTI:  0.07 m                            Systemic Diam: 2.30 cm Candee Furbish MD Electronically signed by Candee Furbish MD Signature Date/Time: 12/26/2021/5:05:24 PM    Final    [place] EKG SITE RITE  Result Date: 12/26/2021 If Site Rite image not attached, placement could not be confirmed due to current cardiac rhythm.  DG Abd Portable 1V  Result Date: 12/26/2021 CLINICAL DATA:  Small-bowel obstruction  EXAM: PORTABLE ABDOMEN - 1 VIEW COMPARISON:  12/25/2021 FINDINGS: Persistent dilated loops of small bowel particularly in the left abdomen. Maximum measurable distension is decreased. Calcified uterine fibroid is again noted. IMPRESSION: Persistent small bowel obstruction. Maximum distension measures decreased.  Electronically Signed   By: Macy Mis M.D.   On: 12/26/2021 08:24   DG Abd Portable 1V  Result Date: 12/25/2021 CLINICAL DATA:  Small bowel obstruction. EXAM: PORTABLE ABDOMEN - 1 VIEW COMPARISON:  Abdominal x-ray from yesterday. FINDINGS: Enteric tube incompletely visualized looped in the left chest, presumably within the large hiatal hernia as seen on CT. Multiple dilated loops of small bowel are unchanged. Calcified uterine fibroid again noted. No acute osseous abnormality. IMPRESSION: 1. Unchanged small bowel obstruction. Electronically Signed   By: Titus Dubin M.D.   On: 12/25/2021 10:31   DG Abd 1 View  Result Date: 12/24/2021 CLINICAL DATA:  None hour delayed film.  Small bowel obstruction. EXAM: ABDOMEN - 1 VIEW COMPARISON:  12/23/2021 FINDINGS: Continued small bowel dilatation compatible with small bowel obstruction. Likely not significantly changed. No visible free air. Large calcified fibroid in the right side of the pelvis. NG tube is not visualized. This likely projects off the superior aspect of the image as the upper abdomen is not included on this single image. IMPRESSION: Continued small bowel obstruction pattern, not significantly changed. Electronically Signed   By: Rolm Baptise M.D.   On: 12/24/2021 23:14   DG Abd Portable 1V  Result Date: 12/23/2021 CLINICAL DATA:  Nasogastric tube placement. Small-bowel obstruction. EXAM: PORTABLE ABDOMEN - 1 VIEW COMPARISON:  Prior today FINDINGS: A nasogastric tube is now seen which appears coiled within a hiatal hernia. Moderately dilated small bowel loops show mild decrease in dilatation since prior exam. IMPRESSION: Nasogastric tube appears coiled within a hiatal hernia. Mildly decreased dilatation of small bowel loops since prior exam. Electronically Signed   By: Marlaine Hind M.D.   On: 12/23/2021 16:27   DG Abd Portable 1V-Small Bowel Obstruction Protocol-24 hr delay  Result Date: 12/23/2021 CLINICAL DATA:  Small bowel  obstruction protocol, 24 hour delayed image. EXAM: PORTABLE ABDOMEN - 1 VIEW COMPARISON:  Abdominal x-rays 12/22/2021. FINDINGS: Contrast material remains in small bowel loops in the left lower quadrant. No colonic contrast identified. Small bowel loops are air-filled and dilated measuring up to 6.7 cm, similar to the prior study. Calcified uterine fibroid again noted. IMPRESSION: 1. Contrast material remains in small bowel loops in the left lower quadrant. Dilated small bowel is unchanged. Findings are compatible with small-bowel obstruction. Electronically Signed   By: Ronney Asters M.D.   On: 12/23/2021 15:27     Assessment and Plan:   Newly recognized cardiomyopathy - echocardiogram revealed LVEF 30-35%, was normal in 2016 - she denies chest pain and prior cardiac problems, nonsmoker - hs troponin negative - she appears euvolemic on exam - volume status complicated by NPO and NG tube in place for SBO - once recovered from SBO, will attempt to titrate GDMT - may be limited by BP - normal renal function - she does not complain of chest pain - will need to consider her functional status when making decisions regarding appropriateness for heart catheterization with possible PCI   Sinus tachycardia - pt developed fever, blood cultures negative, UA unremarkable - telemetry with what appears to be sinus tachycardia, I do not see evidence of atrial arrhythmia - agree with scheduled IV metoprolol - suspect this will improves as her SBO resolves   Hypokalemia  K 3.3 - replace per primary Mg 1.8 - consider 2 g IV   Hyperlipidemia Restart statin when able    Risk Assessment/Risk Scores:     New York Heart Association (NYHA) Functional Class Unable to evaluate - bed-bound        For questions or updates, please contact [facility] Please consult [email or web address] for contact info under    Signed, Ledora Bottcher, [place]  12/27/2021 7:55 AM

## 2021-12-27 NOTE — Progress Notes (Signed)
Day of Surgery  Subjective: CC: No BMs.  Scant flatus.    Objective: Vital signs in last 24 hours: Temp:  [98 F (36.7 C)-99.8 F (37.7 C)] 98 F (36.7 C) (06/29 0533) Pulse Rate:  [106-121] 116 (06/29 0533) Resp:  [16-18] 18 (06/29 0533) BP: (105-154)/(78-96) 105/82 (06/29 0533) SpO2:  [97 %-100 %] 99 % (06/29 0533) Weight:  [84.1 kg] 84.1 kg (06/28 1230) Last BM Date : 12/20/21  Intake/Output from previous day: 06/28 0701 - 06/29 0700 In: 1910.1 [P.O.:20; I.V.:1890.1] Out: 1500 [Urine:900; Emesis/NG output:600] Intake/Output this shift: No intake/output data recorded.  PE: Gen:  Alert, NAD, pleasant Abd: Soft, distended, minimally tender.   NGT on LIWS with bilious output  Lab Results:  Recent Labs    12/26/21 0458 12/27/21 0433  WBC 6.6 8.4  HGB 10.2* 9.8*  HCT 30.0* 28.5*  PLT 248 239   BMET Recent Labs    12/26/21 0458 12/27/21 0433  NA 138 141  K 3.9 3.3*  CL 106 108  CO2 26 27  GLUCOSE 112* 108*  BUN 13 9  CREATININE 0.63 0.66  CALCIUM 7.9* 8.0*   PT/INR No results for input(s): "LABPROT", "INR" in the last 72 hours. CMP     Component Value Date/Time   NA 141 12/27/2021 0433   NA 133 (A) 12/14/2014 0000   K 3.3 (L) 12/27/2021 0433   CL 108 12/27/2021 0433   CO2 27 12/27/2021 0433   GLUCOSE 108 (H) 12/27/2021 0433   BUN 9 12/27/2021 0433   BUN 8 12/14/2014 0000   CREATININE 0.66 12/27/2021 0433   CREATININE 0.56 06/21/2021 1106   CALCIUM 8.0 (L) 12/27/2021 0433   PROT 6.9 12/27/2021 0433   ALBUMIN 2.1 (L) 12/27/2021 0433   AST 10 (L) 12/27/2021 0433   ALT 8 12/27/2021 0433   ALKPHOS 74 12/27/2021 0433   BILITOT 0.3 12/27/2021 0433   GFRNONAA >60 12/27/2021 0433   GFRAA >60 06/04/2017 0230   Lipase     Component Value Date/Time   LIPASE 44 12/20/2021 1306    Studies/Results: VAS [place] LOWER EXTREMITY VENOUS (DVT)  Result Date: 12/26/2021  Lower Venous DVT Study Patient Name:  Katrina Donaldson  Date of Exam:   12/26/2021  Medical Rec #: [id number]        Accession #:    [id number] Date of Birth: 1955/12/19        Patient Gender: F Patient Age:   66 years Exam Location:  [facility] Procedure:      VAS [place] LOWER EXTREMITY VENOUS (DVT) Referring Phys: [place] --------------------------------------------------------------------------------  Indications: Swelling.  Risk Factors: DVT. Limitations: Poor ultrasound/tissue interface and patient positioning, patient immobility, patient pain tolerance. Comparison Study: 07/25/2016 - - Findings consistent with acute deep vein                   thrombosis involving the                   right common femoral vein, right profunda femoris vein, right                   femoral vein, right popliteal vein, right posterial tibial                   vein,                   and right peroneal vein.                   -  Findings consistent with acute superficial vein thrombosis                   involving the right small saphenous vein.                   - No evidence of deep vein thrombosis involving the left lower                   extremity.                   - No evidence of Baker&'s cyst on the right or left. Performing Technologist: Oliver Hum RVT  Examination Guidelines: A complete evaluation includes B-mode imaging, spectral Doppler, color Doppler, and power Doppler as needed of all accessible portions of each vessel. Bilateral testing is considered an integral part of a complete examination. Limited examinations for reoccurring indications may be performed as noted. The reflux portion of the exam is performed with the patient in reverse Trendelenburg.  +---------+---------------+---------+-----------+----------+--------------+ RIGHT    CompressibilityPhasicitySpontaneityPropertiesThrombus Aging +---------+---------------+---------+-----------+----------+--------------+ CFV      Partial        Yes      Yes                  Chronic         +---------+---------------+---------+-----------+----------+--------------+ SFJ      Full                                                        +---------+---------------+---------+-----------+----------+--------------+ FV Prox  Full                                                        +---------+---------------+---------+-----------+----------+--------------+ FV Mid                  Yes      Yes                                 +---------+---------------+---------+-----------+----------+--------------+ FV DistalFull                                                        +---------+---------------+---------+-----------+----------+--------------+ PFV      Full                                                        +---------+---------------+---------+-----------+----------+--------------+ POP      Full           Yes      Yes                                 +---------+---------------+---------+-----------+----------+--------------+ PTV      Full                                                        +---------+---------------+---------+-----------+----------+--------------+  PERO     Full                                                        +---------+---------------+---------+-----------+----------+--------------+   +---------+---------------+---------+-----------+----------+-------------------+ LEFT     CompressibilityPhasicitySpontaneityPropertiesThrombus Aging      +---------+---------------+---------+-----------+----------+-------------------+ CFV      Full           Yes      Yes                                      +---------+---------------+---------+-----------+----------+-------------------+ SFJ      Full                                                             +---------+---------------+---------+-----------+----------+-------------------+ FV Prox  Full                                                              +---------+---------------+---------+-----------+----------+-------------------+ FV Mid   Full                                                             +---------+---------------+---------+-----------+----------+-------------------+ FV Distal               Yes      Yes                                      +---------+---------------+---------+-----------+----------+-------------------+ PFV      Full                                                             +---------+---------------+---------+-----------+----------+-------------------+ POP      Full           Yes      Yes                                      +---------+---------------+---------+-----------+----------+-------------------+ PTV      Full                                                             +---------+---------------+---------+-----------+----------+-------------------+ PERO  Not well visualized +---------+---------------+---------+-----------+----------+-------------------+     Summary: RIGHT: - 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 - No cystic structure found in the popliteal fossa.  LEFT: - There is no evidence of deep vein thrombosis in the lower extremity. However, portions of this examination were limited- see technologist comments above.  - No cystic structure found in the popliteal fossa.  *See table(s) above for measurements and observations. Electronically signed by Orlie Pollen on 12/26/2021 at 5:31:11 PM.    Final    ECHOCARDIOGRAM COMPLETE  Result Date: 12/26/2021    ECHOCARDIOGRAM REPORT   Patient Name:   Katrina Donaldson Date of Exam: 12/26/2021 Medical Rec #:  [id number]       Height:       66.0 in Accession #:    [id number]      Weight:       177.2 lb Date of Birth:  05-22-1956       BSA:          1.900 m Patient Age:    54 years        BP:           125/82 mmHg Patient Gender: F                HR:           120 bpm. Exam Location:  Inpatient Procedure: 2D Echo, Color Doppler and Cardiac Doppler Indications:    Abnormal ekg  History:        Patient has no prior history of Echocardiogram examinations.  Sonographer:    Joette Catching RCS Referring Phys: [id number] [place]  Sonographer Comments: Technically challenging study due to limited acoustic windows. Supine scan. IMPRESSIONS  1. Left ventricular ejection fraction, by estimation, is 30 to 35%. The left ventricle has moderately decreased function. The left ventricle demonstrates global hypokinesis. Left ventricular diastolic parameters are consistent with Grade I diastolic dysfunction (impaired relaxation).  2. Right ventricular systolic function is normal. The right ventricular size is normal.  3. The mitral valve is normal in structure. No evidence of mitral valve regurgitation. No evidence of mitral stenosis.  4. The aortic valve is calcified. There is mild calcification of the aortic valve. There is mild thickening of the aortic valve. Aortic valve regurgitation is not visualized. Aortic valve sclerosis is present, with no evidence of aortic valve stenosis.  5. The inferior vena cava is normal in size with greater than 50% respiratory variability, suggesting right atrial pressure of 3 mmHg. FINDINGS  Left Ventricle: Left ventricular ejection fraction, by estimation, is 30 to 35%. The left ventricle has moderately decreased function. The left ventricle demonstrates global hypokinesis. The left ventricular internal cavity size was normal in size. There is no left ventricular hypertrophy. Abnormal (paradoxical) septal motion, consistent with left bundle branch block. Left ventricular diastolic parameters are consistent with Grade I diastolic dysfunction (impaired relaxation). Right Ventricle: The right ventricular size is normal. No increase in right ventricular wall thickness. Right ventricular systolic function is normal. Left Atrium:  Left atrial size was normal in size. Right Atrium: Right atrial size was normal in size. Pericardium: There is no evidence of pericardial effusion. Mitral Valve: The mitral valve is normal in structure. No evidence of mitral valve regurgitation. No evidence of mitral valve stenosis. Tricuspid Valve: The tricuspid valve is normal in structure. Tricuspid valve regurgitation is not demonstrated. No evidence of tricuspid stenosis. Aortic Valve: The aortic valve is calcified. There is  mild calcification of the aortic valve. There is mild thickening of the aortic valve. Aortic valve regurgitation is not visualized. Aortic valve sclerosis is present, with no evidence of aortic valve stenosis. Aortic valve mean gradient measures 4.0 mmHg. Aortic valve peak gradient measures 6.2 mmHg. Aortic valve area, by VTI measures 2.26 cm. Pulmonic Valve: The pulmonic valve was normal in structure. Pulmonic valve regurgitation is not visualized. No evidence of pulmonic stenosis. Aorta: The aortic root is normal in size and structure. Venous: The inferior vena cava is normal in size with greater than 50% respiratory variability, suggesting right atrial pressure of 3 mmHg. IAS/Shunts: No atrial level shunt detected by color flow Doppler.  LEFT VENTRICLE PLAX 2D LVIDd:         3.80 cm   Diastology LVIDs:         3.30 cm   LV e' medial:    5.22 cm/s LV PW:         1.00 cm   LV E/e' medial:  10.0 LV IVS:        0.90 cm   LV e' lateral:   5.77 cm/s LVOT diam:     2.30 cm   LV E/e' lateral: 9.1 LV SV:         30 LV SV Index:   16 LVOT Area:     4.15 cm  RIGHT VENTRICLE             IVC RV S prime:     29.70 cm/s  IVC diam: 1.70 cm TAPSE (M-mode): 2.1 cm LEFT ATRIUM             Index LA diam:        2.10 cm 1.11 cm/m LA Vol (A2C):   20.0 ml 10.53 ml/m LA Vol (A4C):   57.6 ml 30.32 ml/m LA Biplane Vol: 35.8 ml 18.84 ml/m  AORTIC VALVE                    PULMONIC VALVE AV Area (Vmax):    2.08 cm     PV Vmax:          1.15 m/s AV Area  (Vmean):   2.10 cm     PV Peak grad:     5.3 mmHg AV Area (VTI):     2.26 cm     PR End Diast Vel: 10.76 msec AV Vmax:           125.00 cm/s AV Vmean:          94.000 cm/s AV VTI:            0.134 m AV Peak Grad:      6.2 mmHg AV Mean Grad:      4.0 mmHg LVOT Vmax:         62.70 cm/s LVOT Vmean:        47.600 cm/s LVOT VTI:          0.073 m LVOT/AV VTI ratio: 0.54  AORTA Ao Root diam: 3.40 cm Ao Asc diam:  3.30 cm MITRAL VALVE               TRICUSPID VALVE MV Area (PHT): 5.84 cm    TR Peak grad:   25.0 mmHg MV Decel Time: 130 msec    TR Vmax:        250.00 cm/s MV E velocity: 52.30 cm/s MV A velocity: 78.40 cm/s  SHUNTS MV E/A ratio:  0.67  Systemic VTI:  0.07 m                            Systemic Diam: 2.30 cm Candee Furbish MD Electronically signed by Candee Furbish MD Signature Date/Time: 12/26/2021/5:05:24 PM    Final    [place] EKG SITE RITE  Result Date: 12/26/2021 If Site Rite image not attached, placement could not be confirmed due to current cardiac rhythm.  DG Abd Portable 1V  Result Date: 12/26/2021 CLINICAL DATA:  Small-bowel obstruction EXAM: PORTABLE ABDOMEN - 1 VIEW COMPARISON:  12/25/2021 FINDINGS: Persistent dilated loops of small bowel particularly in the left abdomen. Maximum measurable distension is decreased. Calcified uterine fibroid is again noted. IMPRESSION: Persistent small bowel obstruction. Maximum distension measures decreased. Electronically Signed   By: Macy Mis M.D.   On: 12/26/2021 08:24   DG Abd Portable 1V  Result Date: 12/25/2021 CLINICAL DATA:  Small bowel obstruction. EXAM: PORTABLE ABDOMEN - 1 VIEW COMPARISON:  Abdominal x-ray from yesterday. FINDINGS: Enteric tube incompletely visualized looped in the left chest, presumably within the large hiatal hernia as seen on CT. Multiple dilated loops of small bowel are unchanged. Calcified uterine fibroid again noted. No acute osseous abnormality. IMPRESSION: 1. Unchanged small bowel obstruction. Electronically Signed    By: Titus Dubin M.D.   On: 12/25/2021 10:31    Anti-infectives: Anti-infectives (From admission, onward)    None        Assessment/Plan SBO - no resolution. Surgery planned today if cardiology OK.  EF decreased on yesterday's echo.  Will hold heparin gtt in anticipation of surgery.    FEN - NPO. NGT to LIWS. IVFs per TRH.  VTE - SCDs, hep gtt on hold ID - prophylactic antibiotic for surgery only.     - per TRH -  Tachycardia  HTN Hx CVA Wheelchair bound Hypercoagulable state H/O DVT on Xarelto (held) Hypothyroidism HLD IDA GERD   I reviewed hospitalist notes, last 24 h vitals and pain scores, last 48 h intake and output, last 24 h labs and trends, and last 24 h imaging results.     LOS: 7 days    Milus Height, MD FACS Surgical Oncology, General Surgery, Trauma and [facility], [place] for weekday/non holidays Check [email or web address] for coverage night/weekend/holidays  Do not use SecureChat as it is not reliable for timely patient care.

## 2021-12-27 NOTE — Progress Notes (Signed)
Peripherally Inserted Central Catheter Placement  The IV Nurse has discussed with the patient and/or persons authorized to consent for the patient, the purpose of this procedure and the potential benefits and risks involved with this procedure.  The benefits include less needle sticks, lab draws from the catheter, and the patient may be discharged home with the catheter. Risks include, but not limited to, infection, bleeding, blood clot (thrombus formation), and puncture of an artery; nerve damage and irregular heartbeat and possibility to perform a PICC exchange if needed/ordered by physician.  Alternatives to this procedure were also discussed.  Bard Power PICC patient education guide, fact sheet on infection prevention and patient information card has been provided to patient /or left at bedside.    PICC Placement Documentation  PICC Double Lumen 12/27/21 Right Brachial 39 cm 2 cm (Active)  Indication for Insertion or Continuance of Line Administration of hyperosmolar/irritating solutions (i.e. TPN, Vancomycin, etc.) 12/27/21 1515  Exposed Catheter (cm) 2 cm 12/27/21 1515  Site Assessment Clean, Dry, Intact 12/27/21 1515  Lumen #1 Status Flushed;Saline locked;Blood return noted 12/27/21 1515  Lumen #2 Status Flushed;Saline locked;Blood return noted 12/27/21 1515  Dressing Type Transparent;Securing device 12/27/21 1515  Dressing Status Antimicrobial disc in place;Clean, Dry, Intact 12/27/21 1515  Dressing Intervention New dressing 12/27/21 1515  Dressing Change Due 01/03/22 12/27/21 [place] 12/27/2021, 3:21 PM

## 2021-12-28 ENCOUNTER — Inpatient Hospital Stay (HOSPITAL_COMMUNITY): Payer: Medicare Other | Admitting: Anesthesiology

## 2021-12-28 ENCOUNTER — Encounter (HOSPITAL_COMMUNITY)
Admission: EM | Disposition: A | Discharge status: Skilled Nursing Facility | Payer: Self-pay | Source: Skilled Nursing Facility | Attending: Internal Medicine

## 2021-12-28 ENCOUNTER — Encounter (HOSPITAL_COMMUNITY): Payer: Self-pay | Admitting: Internal Medicine

## 2021-12-28 DIAGNOSIS — K56609 Unspecified intestinal obstruction, unspecified as to partial versus complete obstruction: Secondary | ICD-10-CM | POA: Diagnosis not present

## 2021-12-28 DIAGNOSIS — E039 Hypothyroidism, unspecified: Secondary | ICD-10-CM

## 2021-12-28 DIAGNOSIS — I509 Heart failure, unspecified: Secondary | ICD-10-CM

## 2021-12-28 DIAGNOSIS — I110 Hypertensive heart disease with heart failure: Secondary | ICD-10-CM

## 2021-12-28 HISTORY — PX: LAPAROTOMY: SHX154

## 2021-12-28 LAB — BASIC METABOLIC PANEL
Anion gap: 6 (ref 5–15)
BUN: 10 mg/dL (ref 8–23)
CO2: 28 mmol/L (ref 22–32)
Calcium: 7.7 mg/dL — ABNORMAL LOW (ref 8.9–10.3)
Chloride: 104 mmol/L (ref 98–111)
Creatinine, Ser: 0.53 mg/dL (ref 0.44–1.00)
GFR, Estimated: >60 mL/min (ref >60)
Glucose, Bld: 112 mg/dL — ABNORMAL HIGH (ref 70–99)
Potassium: 3.4 mmol/L — ABNORMAL LOW (ref 3.5–5.1)
Sodium: 138 mmol/L (ref 135–145)

## 2021-12-28 LAB — PHOSPHORUS: Phosphorus: 3.6 mg/dL (ref 2.5–4.6)

## 2021-12-28 LAB — TYPE AND SCREEN
ABO/RH(D): AB POS
Antibody Screen: NEGATIVE

## 2021-12-28 LAB — CBC WITH DIFFERENTIAL/PLATELET
Abs Immature Granulocytes: 0.17 10*3/uL — ABNORMAL HIGH (ref 0.00–0.07)
Basophils Absolute: 0 10*3/uL (ref 0.0–0.1)
Basophils Relative: 0 %
Eosinophils Absolute: 0.2 10*3/uL (ref 0.0–0.5)
Eosinophils Relative: 2 %
HCT: 25.5 % — ABNORMAL LOW (ref 36.0–46.0)
Hemoglobin: 8.8 g/dL — ABNORMAL LOW (ref 12.0–15.0)
Immature Granulocytes: 2 %
Lymphocytes Relative: 12 %
Lymphs Abs: 1.1 10*3/uL (ref 0.7–4.0)
MCH: 26.3 pg (ref 26.0–34.0)
MCHC: 34.5 g/dL (ref 30.0–36.0)
MCV: 76.3 fL — ABNORMAL LOW (ref 80.0–100.0)
Monocytes Absolute: 0.7 10*3/uL (ref 0.1–1.0)
Monocytes Relative: 8 %
Neutro Abs: 6.9 10*3/uL (ref 1.7–7.7)
Neutrophils Relative %: 76 %
Platelets: 218 10*3/uL (ref 150–400)
RBC: 3.34 MIL/uL — ABNORMAL LOW (ref 3.87–5.11)
RDW: 17.3 % — ABNORMAL HIGH (ref 11.5–15.5)
WBC: 9 10*3/uL (ref 4.0–10.5)
nRBC: 0 % (ref 0.0–0.2)

## 2021-12-28 LAB — GLUCOSE, CAPILLARY
Glucose-Capillary: 126 mg/dL — ABNORMAL HIGH (ref 70–99)
Glucose-Capillary: 179 mg/dL — ABNORMAL HIGH (ref 70–99)
Glucose-Capillary: 232 mg/dL — ABNORMAL HIGH (ref 70–99)

## 2021-12-28 LAB — MAGNESIUM: Magnesium: 1.8 mg/dL (ref 1.7–2.4)

## 2021-12-28 SURGERY — LAPAROTOMY, EXPLORATORY
Anesthesia: General | Site: Abdomen

## 2021-12-28 MED ORDER — OXYCODONE HCL 5 MG/5ML PO SOLN: 5.0000 mg | Freq: Once | ORAL | Status: DC | PRN

## 2021-12-28 MED ORDER — FENTANYL CITRATE (PF) 100 MCG/2ML IJ SOLN
INTRAMUSCULAR | Status: DC | PRN
  Administered 2021-12-28: 50 ug via INTRAVENOUS
  Administered 2021-12-28: 25 ug via INTRAVENOUS
  Administered 2021-12-28: 50 ug via INTRAVENOUS
  Administered 2021-12-28 (×3): 25 ug via INTRAVENOUS

## 2021-12-28 MED ORDER — TRAVASOL 10 % IV SOLN
INTRAVENOUS | Status: AC
  Filled 2021-12-28: qty 902.4

## 2021-12-28 MED ORDER — HYDROMORPHONE HCL 1 MG/ML IJ SOLN: 0.2500 mg | INTRAMUSCULAR | Status: DC | PRN

## 2021-12-28 MED ORDER — SUCCINYLCHOLINE CHLORIDE 200 MG/10ML IV SOSY
PREFILLED_SYRINGE | INTRAVENOUS | Status: DC | PRN
  Administered 2021-12-28: 100 mg via INTRAVENOUS

## 2021-12-28 MED ORDER — CEFAZOLIN SODIUM-DEXTROSE 2-4 GM/100ML-% IV SOLN
INTRAVENOUS | Status: AC
  Filled 2021-12-28: qty 100

## 2021-12-28 MED ORDER — ONDANSETRON HCL 4 MG/2ML IJ SOLN: 4.0000 mg | Freq: Once | INTRAMUSCULAR | Status: DC | PRN

## 2021-12-28 MED ORDER — CEFAZOLIN SODIUM-DEXTROSE 2-4 GM/100ML-% IV SOLN
2.0000 g | Freq: Once | INTRAVENOUS | Status: AC
  Administered 2021-12-28: 2 g via INTRAVENOUS

## 2021-12-28 MED ORDER — VASOPRESSIN 20 UNIT/ML IV SOLN
INTRAVENOUS | Status: AC
  Filled 2021-12-28: qty 1

## 2021-12-28 MED ORDER — 0.9 % SODIUM CHLORIDE (POUR BTL) OPTIME
TOPICAL | Status: DC | PRN
  Administered 2021-12-28: 2000 mL

## 2021-12-28 MED ORDER — ROCURONIUM BROMIDE 10 MG/ML (PF) SYRINGE
PREFILLED_SYRINGE | INTRAVENOUS | Status: AC
  Filled 2021-12-28: qty 10

## 2021-12-28 MED ORDER — PROPOFOL 10 MG/ML IV BOLUS
INTRAVENOUS | Status: AC
  Filled 2021-12-28: qty 20

## 2021-12-28 MED ORDER — ROCURONIUM BROMIDE 10 MG/ML (PF) SYRINGE
PREFILLED_SYRINGE | INTRAVENOUS | Status: DC | PRN
  Administered 2021-12-28: 20 mg via INTRAVENOUS
  Administered 2021-12-28: 50 mg via INTRAVENOUS

## 2021-12-28 MED ORDER — PHENYLEPHRINE HCL-NACL 20-0.9 MG/250ML-% IV SOLN
INTRAVENOUS | Status: AC
  Filled 2021-12-28: qty 250

## 2021-12-28 MED ORDER — ACETAMINOPHEN 10 MG/ML IV SOLN
INTRAVENOUS | Status: DC | PRN
  Administered 2021-12-28: 1000 mg via INTRAVENOUS

## 2021-12-28 MED ORDER — ALBUMIN HUMAN 5 % IV SOLN: INTRAVENOUS | Status: DC | PRN

## 2021-12-28 MED ORDER — OXYCODONE HCL 5 MG PO TABS
5.0000 mg | ORAL_TABLET | ORAL | Status: DC | PRN
  Administered 2021-12-30 (×2): 5 mg via ORAL
  Filled 2021-12-28 (×2): qty 1

## 2021-12-28 MED ORDER — FENTANYL CITRATE (PF) 100 MCG/2ML IJ SOLN
INTRAMUSCULAR | Status: AC
  Filled 2021-12-28: qty 2

## 2021-12-28 MED ORDER — ONDANSETRON HCL 4 MG/2ML IJ SOLN
INTRAMUSCULAR | Status: DC | PRN
  Administered 2021-12-28: 4 mg via INTRAVENOUS

## 2021-12-28 MED ORDER — SUGAMMADEX SODIUM 200 MG/2ML IV SOLN
INTRAVENOUS | Status: DC | PRN
  Administered 2021-12-28: 300 mg via INTRAVENOUS

## 2021-12-28 MED ORDER — OXYCODONE HCL 5 MG PO TABS: 5.0000 mg | ORAL_TABLET | Freq: Once | ORAL | Status: DC | PRN

## 2021-12-28 MED ORDER — PROPOFOL 10 MG/ML IV BOLUS
INTRAVENOUS | Status: DC | PRN
  Administered 2021-12-28: 80 mg via INTRAVENOUS

## 2021-12-28 MED ORDER — LIDOCAINE 2% (20 MG/ML) 5 ML SYRINGE
INTRAMUSCULAR | Status: DC | PRN
  Administered 2021-12-28: 60 mg via INTRAVENOUS

## 2021-12-28 MED ORDER — DEXAMETHASONE SODIUM PHOSPHATE 10 MG/ML IJ SOLN
INTRAMUSCULAR | Status: DC | PRN
  Administered 2021-12-28: 4 mg via INTRAVENOUS

## 2021-12-28 MED ORDER — ACETAMINOPHEN 500 MG PO TABS: 1000.0000 mg | ORAL_TABLET | Freq: Once | ORAL | Status: DC

## 2021-12-28 MED ORDER — ONDANSETRON HCL 4 MG/2ML IJ SOLN
INTRAMUSCULAR | Status: AC
  Filled 2021-12-28: qty 2

## 2021-12-28 MED ORDER — BUPIVACAINE HCL (PF) 0.5 % IJ SOLN
INTRAMUSCULAR | Status: AC
  Filled 2021-12-28: qty 30

## 2021-12-28 MED ORDER — DEXAMETHASONE SODIUM PHOSPHATE 10 MG/ML IJ SOLN
INTRAMUSCULAR | Status: AC
  Filled 2021-12-28: qty 1

## 2021-12-28 MED ORDER — AMISULPRIDE (ANTIEMETIC) 5 MG/2ML IV SOLN: 10.0000 mg | Freq: Once | INTRAVENOUS | Status: DC | PRN

## 2021-12-28 MED ORDER — SUCCINYLCHOLINE CHLORIDE 200 MG/10ML IV SOSY
PREFILLED_SYRINGE | INTRAVENOUS | Status: AC
  Filled 2021-12-28: qty 10

## 2021-12-28 MED ORDER — ALBUMIN HUMAN 5 % IV SOLN
INTRAVENOUS | Status: AC
  Filled 2021-12-28: qty 250

## 2021-12-28 MED ORDER — MIDAZOLAM HCL 5 MG/5ML IJ SOLN
INTRAMUSCULAR | Status: DC | PRN
  Administered 2021-12-28 (×2): 1 mg via INTRAVENOUS

## 2021-12-28 MED ORDER — PHENYLEPHRINE HCL (PRESSORS) 10 MG/ML IV SOLN
INTRAVENOUS | Status: DC | PRN
  Administered 2021-12-28 (×2): 160 ug via INTRAVENOUS
  Administered 2021-12-28: 80 ug via INTRAVENOUS
  Administered 2021-12-28: 160 ug via INTRAVENOUS
  Administered 2021-12-28: 80 ug via INTRAVENOUS
  Administered 2021-12-28: 160 ug via INTRAVENOUS
  Administered 2021-12-28 (×2): 80 ug via INTRAVENOUS

## 2021-12-28 MED ORDER — MIDAZOLAM HCL 2 MG/2ML IJ SOLN
INTRAMUSCULAR | Status: AC
  Filled 2021-12-28: qty 2

## 2021-12-28 MED ORDER — POTASSIUM CHLORIDE 10 MEQ/100ML IV SOLN: 10.0000 meq | INTRAVENOUS | Status: AC

## 2021-12-28 MED ORDER — MORPHINE SULFATE (PF) 2 MG/ML IV SOLN
2.0000 mg | INTRAVENOUS | Status: DC | PRN
  Administered 2021-12-28 – 2022-01-06 (×25): 2 mg via INTRAVENOUS
  Filled 2021-12-28 (×25): qty 1

## 2021-12-28 MED ORDER — LIDOCAINE HCL (PF) 2 % IJ SOLN
INTRAMUSCULAR | Status: AC
  Filled 2021-12-28: qty 5

## 2021-12-28 MED ORDER — FENTANYL CITRATE (PF) 250 MCG/5ML IJ SOLN
INTRAMUSCULAR | Status: AC
  Filled 2021-12-28: qty 5

## 2021-12-28 MED ORDER — PHENYLEPHRINE HCL-NACL 20-0.9 MG/250ML-% IV SOLN
INTRAVENOUS | Status: DC | PRN
  Administered 2021-12-28: 50 ug/min via INTRAVENOUS

## 2021-12-28 MED ORDER — LACTATED RINGERS IV SOLN: INTRAVENOUS | Status: DC | PRN

## 2021-12-28 MED ORDER — ACETAMINOPHEN 10 MG/ML IV SOLN
INTRAVENOUS | Status: AC
  Filled 2021-12-28: qty 100

## 2021-12-28 MED ORDER — HEPARIN (PORCINE) 25000 UT/250ML-% IV SOLN
1400.0000 [IU]/h | INTRAVENOUS | Status: DC
  Administered 2021-12-28: 1400 [IU]/h via INTRAVENOUS
  Filled 2021-12-28: qty 250

## 2021-12-28 SURGICAL SUPPLY — 43 items
APPLICATOR COTTON TIP 6 STRL (MISCELLANEOUS) ×2 IMPLANT
APPLICATOR COTTON TIP 6IN STRL (MISCELLANEOUS) IMPLANT
BAG COUNTER SPONGE SURGICOUNT (BAG) IMPLANT
BLADE EXTENDED COATED 6.5IN (ELECTRODE) IMPLANT
BLADE HEX COATED 2.75 (ELECTRODE) ×2 IMPLANT
BLADE SURG SZ10 CARB STEEL (BLADE) IMPLANT
COVER MAYO STAND STRL (DRAPES) ×2 IMPLANT
COVER SURGICAL LIGHT HANDLE (MISCELLANEOUS) ×2 IMPLANT
DRAIN CHANNEL 19F RND (DRAIN) IMPLANT
DRAPE LAPAROSCOPIC ABDOMINAL (DRAPES) ×2 IMPLANT
DRAPE SHEET LG 3/4 BI-LAMINATE (DRAPES) IMPLANT
DRAPE WARM FLUID 44X44 (DRAPES) ×2 IMPLANT
DRSG OPSITE POSTOP 4X10 (GAUZE/BANDAGES/DRESSINGS) ×1 IMPLANT
DRSG PAD ABDOMINAL 8X10 ST (GAUZE/BANDAGES/DRESSINGS) IMPLANT
ELECT REM PT RETURN 15FT ADLT (MISCELLANEOUS) ×2 IMPLANT
EVACUATOR DRAINAGE 10X20 100CC (DRAIN) IMPLANT
EVACUATOR SILICONE 100CC (DRAIN)
GAUZE SPONGE 4X4 12PLY STRL (GAUZE/BANDAGES/DRESSINGS) ×2 IMPLANT
GLOVE BIO SURGEON STRL SZ 6 (GLOVE) ×6 IMPLANT
GLOVE BIOGEL PI IND STRL 6.5 (GLOVE) ×1 IMPLANT
GLOVE BIOGEL PI IND STRL 7.0 (GLOVE) ×1 IMPLANT
GLOVE BIOGEL PI INDICATOR 6.5 (GLOVE) ×1
GLOVE BIOGEL PI INDICATOR 7.0 (GLOVE) ×1
GLOVE INDICATOR 6.5 STRL GRN (GLOVE) ×4 IMPLANT
GOWN SPEC L3 XXLG W/TWL (GOWN DISPOSABLE) IMPLANT
GOWN STRL REUS W/ TWL XL LVL3 (GOWN DISPOSABLE) ×3 IMPLANT
GOWN STRL REUS W/TWL 2XL LVL3 (GOWN DISPOSABLE) ×2 IMPLANT
GOWN STRL REUS W/TWL XL LVL3 (GOWN DISPOSABLE) ×2
HANDLE SUCTION POOLE (INSTRUMENTS) ×1 IMPLANT
KIT BASIN OR (CUSTOM PROCEDURE TRAY) ×2 IMPLANT
KIT TURNOVER KIT A (KITS) IMPLANT
LEGGING LITHOTOMY PAIR STRL (DRAPES) IMPLANT
NS IRRIG 1000ML POUR BTL (IV SOLUTION) ×4 IMPLANT
PACK GENERAL/GYN (CUSTOM PROCEDURE TRAY) ×2 IMPLANT
STAPLER VISISTAT (STAPLE) ×1 IMPLANT
STAPLER VISISTAT 35W (STAPLE) ×1 IMPLANT
SUCTION POOLE HANDLE (INSTRUMENTS)
SUT PDS AB 0 CTX 60 (SUTURE) ×2 IMPLANT
SUT VIC AB 2-0 SH 18 (SUTURE) ×2 IMPLANT
SUT VIC AB 3-0 SH 18 (SUTURE) ×4 IMPLANT
TOWEL OR 17X26 10 PK STRL BLUE (TOWEL DISPOSABLE) ×3 IMPLANT
TRAY FOLEY MTR SLVR 16FR STAT (SET/KITS/TRAYS/PACK) ×2 IMPLANT
YANKAUER SUCT BULB TIP NO VENT (SUCTIONS) ×2 IMPLANT

## 2021-12-28 NOTE — Progress Notes (Signed)
24 hour chart audit completed 

## 2021-12-28 NOTE — Anesthesia Postprocedure Evaluation (Signed)
Anesthesia Post Note  Patient: Katrina Donaldson  Procedure(s) Performed: EXPLORATORY LAPAROTOMY, LYSIS OF ADHESIONS (Abdomen)     Patient location during evaluation: PACU Anesthesia Type: General Level of consciousness: awake and alert, oriented and patient cooperative Pain management: pain level controlled Vital Signs Assessment: post-procedure vital signs reviewed and stable Respiratory status: spontaneous breathing, nonlabored ventilation and respiratory function stable Cardiovascular status: blood pressure returned to baseline and stable Postop Assessment: no apparent nausea or vomiting Anesthetic complications: no   No notable events documented.  Last Vitals:  Vitals:   12/28/21 1300 12/28/21 1301  BP: (!) 156/89   Pulse: 100   Resp: 12 12  Temp: 36.5 C   SpO2: 98%     Last Pain:  Vitals:   12/28/21 1300  TempSrc:   PainSc: 0-No pain                 Pervis Hocking

## 2021-12-28 NOTE — Anesthesia Procedure Notes (Addendum)
Procedure Name: Intubation Date/Time: 12/28/2021 11:25 AM  Performed by: West Pugh, CRNAPre-anesthesia Checklist: Patient identified, Emergency Drugs available, Suction available, Patient being monitored and Timeout performed Patient Re-evaluated:Patient Re-evaluated prior to induction Oxygen Delivery Method: Circle system utilized Preoxygenation: Pre-oxygenation with 100% oxygen Induction Type: IV induction, Rapid sequence and Cricoid Pressure applied Laryngoscope Size: Mac and 3 Grade View: Grade I Tube type: Oral Tube size: 7.0 mm Number of attempts: 1 Airway Equipment and Method: Stylet Placement Confirmation: ETT inserted through vocal cords under direct vision, positive ETCO2, CO2 detector and breath sounds checked- equal and bilateral Secured at: 22 cm Tube secured with: Tape Dental Injury: Teeth and Oropharynx as per pre-operative assessment

## 2021-12-28 NOTE — Addendum Note (Signed)
Addendum  created 12/28/21 1433 by West Pugh, CRNA   Flowsheet accepted, Intraprocedure Flowsheets edited

## 2021-12-28 NOTE — Progress Notes (Addendum)
[place] for heparin Indication: history of DVT on rivaroxaban PTA, hypercoagulable state  Allergies  Allergen Reactions   Shellfish-Derived Products Other (See Comments)    ALLERGIC TO SHRIMP!! Made the tongue "feel strange" Per patient, she has since eaten crab legs and did not have another reaction.     Patient Measurements: Height: '5\' 6"'$  (167.6 cm) Weight: 84.1 kg (185 lb 6.5 oz) IBW/kg (Calculated) : 59.3 Heparin Dosing Weight: 76 kg  Vital Signs: Temp: 97.7 F (36.5 C) (06/30 1300) Temp Source: Oral (06/30 0949) BP: 158/98 (06/30 1350) Pulse Rate: 103 (06/30 1350)  Labs: Recent Labs    12/26/21 0458 12/26/21 1658 12/26/21 2212 12/27/21 0433 12/27/21 2309 12/28/21 0314  HGB 10.2*  --   --  9.8*  --  8.8*  HCT 30.0*  --   --  28.5*  --  25.5*  PLT 248  --   --  239  --  218  HEPARINUNFRC  --   --  <0.10*  --  0.39  --   CREATININE 0.63  --   --  0.66  --  0.53  TROPONINIHS  --  12  --   --   --   --      Estimated Creatinine Clearance: 73.5 mL/min (by C-G formula based on SCr of 0.53 mg/dL).   Medical History: Past Medical History:  Diagnosis Date   AKI (acute kidney injury) ([place]) 12/26/2021   Arachnoid cyst    Brain aneurysm    Chronic anticoagulation 05/26/2017   Depression    History of DVT (deep vein thrombosis)    Hypercoagulable state ([place])    Hypertension    Hypothyroidism    Stroke ([place]) 2016   [facility] 11/29/14   Wheelchair bound     Medications: Rivaroxaban 20 mg PO Daily PTA -Last dose: 6/21  Assessment: Pt is a 51 yoF admitted with SBO and initially treated with conservative management. PMH significant for DVT in 2018, hypercoagulable state on Xarelto PTA.   Xarelto held on admission, was on enoxaparin for DVT ppx from 6/22 > 6/27. Heparin drip was started on 6/27, but has been intermittently infusing as it was held for possible surgery on 6/28, 6/29, and 6/30 AM.   Patient underwent ex lap and  lysis of adhesions today (6/30). Heparin drip was turned off @ 0005 this morning pre-op.   Today, 12/28/21 CBC: Hgb slightly low and decreased, Plt WNL  Discussed peri-operative anticoagulation timing with CCS. Verbal order to resume heparin infusion with no bolus 8 hours post-op. AET 12:55  Goal of Therapy:  Heparin level 0.3-0.7 units/ml Monitor platelets by anticoagulation protocol: Yes   Plan:  No bolus post-op Resume heparin infusion at previously therapeutic rate of 1400 units/hr @ 2100 this evening (8 hours post-op) Check 6-8 hour HL HL, CBC daily  Monitor for signs of bleeding  Lenis Noon, PharmD 12/28/21 2:09 PM

## 2021-12-28 NOTE — Progress Notes (Signed)
Patient's vital signs turned to yellow for pulse 114. Patient is alert and oriented. Discussed patient's conditions with charge Nurse  RN Cristy. Given medicine for pain. Will continue monitor.

## 2021-12-28 NOTE — Transfer of Care (Signed)
Immediate Anesthesia Transfer of Care Note  Patient: Katrina Donaldson  Procedure(s) Performed: EXPLORATORY LAPAROTOMY, LYSIS OF ADHESIONS (Abdomen)  Patient Location: PACU  Anesthesia Type:General  Level of Consciousness: awake and patient cooperative  Airway & Oxygen Therapy: Patient Spontanous Breathing and Patient connected to face mask oxygen  Post-op Assessment: Report given to RN and Post -op Vital signs reviewed and stable  Post vital signs: Reviewed and stable  Last Vitals:  Vitals Value Taken Time  BP 156/89 12/28/21 1300  Temp    Pulse 100 12/28/21 1300  Resp 12 12/28/21 1301  SpO2 98% 12/28/21 1300  Vitals shown include unvalidated device data.  Last Pain:  Vitals:   12/28/21 0949  TempSrc: Oral  PainSc: 0-No pain      Patients Stated Pain Goal: 1 (43/14/27 6701)  Complications: No notable events documented.

## 2021-12-28 NOTE — Progress Notes (Signed)
PHARMACY - TOTAL PARENTERAL NUTRITION CONSULT NOTE   Indication: Small bowel obstruction  Patient Measurements: Height: '5\' 6"'$  (167.6 cm) Weight: 84.1 kg (185 lb 6.5 oz) IBW/kg (Calculated) : 59.3 TPN AdjBW (KG): 64.4 Body mass index is 29.93 kg/m.  Assessment:  66 yo F presents with abdominal pain, nausea, vomiting, and poor appetite x 5 days. 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 No BM for 5 days as well. NG output was 1.6 L and xray shows SBO. Going for ex lap, lysis of adhesions and possible bowel resection on 6/29. Does report shellfish allergy but not any fish. Reacted to shrimp but not crab and was not an anaphylactic reaction. Pharmacy consulted for TPN.  Glucose / Insulin: No hx of DM. CBGs ok.  Minimal SSI use. Electrolytes: K low, Phos improved, others WNL. Renal: SCr wnl. Hepatic:  LFTs, Tbili, and Trig ok. Albumin low at 2.1. Intake / Output; MIVF: IVF at [facility]; UOP decreased to 400 mL (UOP x1 occurrence), NG output inc to 900 mL  GI Surgeries / Procedures:  Ex lap with possible bowel resection - timing TBD  Central access: PICC placed 6/29 TPN start date: 6/29  Nutritional Goals: Goal TPN rate is 80 mL/hr (provides 90 g of protein and 2,085 kcals per day)  RD Assessment: Estimated Needs Total Energy Estimated Needs: 1950-2150 Total Protein Estimated Needs: 85-95g Total Fluid Estimated Needs: 2L/day  Current Nutrition:  NPO and TPN  Plan:  KCl 10 mEq IV x3 runs  Advance to TPN at 81m/hr at 1800. Electrolytes in TPN: Na 524m/L, K 5043mL, Ca 5mE32m, Mg 5mEq51m and Phos 15mmo74m Cl:Ac 1:1 Add standard MVI and trace elements to TPN Continue Sensitive q6h SSI and adjust as needed  Continue D51[facility]2NS [facility] labs on Mon/Thurs, recheck Bmet and Phos tomorrow F/U nutrition plans after surgery.    ChristGretta ArabD, BCPS Clinical Pharmacist [facility] main pharmacy [phone] 9:03 AM

## 2021-12-28 NOTE — Anesthesia Preprocedure Evaluation (Addendum)
Anesthesia Evaluation  Patient identified by MRN, date of birth, ID band Patient awake    Reviewed: Allergy & Precautions, NPO status , Patient's Chart, lab work & pertinent test results, reviewed documented beta blocker date and time   Airway Mallampati: II  TM Distance: >3 FB Neck ROM: Full    Dental  (+) Missing, Dental Advisory Given,    Pulmonary neg pulmonary ROS,    Pulmonary exam normal breath sounds clear to auscultation       Cardiovascular hypertension, Pt. on medications and Pt. on home beta blockers +CHF (LVEF 96-22%, grade 1 diastolic dysfunction) and + DVT  Normal cardiovascular exam+ Valvular Problems/Murmurs  Rhythm:Regular Rate:Normal  Echo 2023 1. Left ventricular ejection fraction, by estimation, is 30 to 35%. The  left ventricle has moderately decreased function. The left ventricle  demonstrates global hypokinesis. Left ventricular diastolic parameters are  consistent with Grade I diastolic  dysfunction (impaired relaxation).  2. Right ventricular systolic function is normal. The right ventricular  size is normal.  3. The mitral valve is normal in structure. No evidence of mitral valve  regurgitation. No evidence of mitral stenosis.  4. The aortic valve is calcified. There is mild calcification of the  aortic valve. There is mild thickening of the aortic valve. Aortic valve  regurgitation is not visualized. Aortic valve sclerosis is present, with  no evidence of aortic valve stenosis.  5. The inferior vena cava is normal in size with greater than 50%  respiratory variability, suggesting right atrial pressure of 3 mmHg.    Neuro/Psych PSYCHIATRIC DISORDERS Depression CVA (2016)    GI/Hepatic Neg liver ROS, GERD  Medicated and Controlled,Bowel obstruction    Endo/Other  Hypothyroidism   Renal/GU negative Renal ROS  negative genitourinary   Musculoskeletal negative musculoskeletal ROS (+)    Abdominal   Peds  Hematology  (+) Blood dyscrasia, anemia , Hb 8.8 hypercoag state (DVT)- xarelto LD 6/21, has been therapeutic on heparin drip this admission   Anesthesia Other Findings   Reproductive/Obstetrics negative OB ROS                           Anesthesia Physical Anesthesia Plan  ASA: 4  Anesthesia Plan: General   Post-op Pain Management: Tylenol PO (pre-op)*   Induction: Intravenous  PONV Risk Score and Plan: 3 and Ondansetron, Dexamethasone, Midazolam and Treatment may vary due to age or medical condition  Airway Management Planned: Oral ETT  Additional Equipment: None  Intra-op Plan:   Post-operative Plan: Extubation in OR  Informed Consent: I have reviewed the patients History and Physical, chart, labs and discussed the procedure including the risks, benefits and alternatives for the proposed anesthesia with the patient or authorized representative who has indicated his/her understanding and acceptance.     Dental advisory given  Plan Discussed with: CRNA  Anesthesia Plan Comments: (Access: PIV x 2, PICC RUE w/ TPN )     Anesthesia Quick Evaluation

## 2021-12-28 NOTE — Progress Notes (Signed)
PROGRESS NOTE Katrina Donaldson  QIH:[id number] DOB: [birth year] DOA: 12/20/2021 PCP: Donald Prose, MD   Brief Narrative/Hospital Course: 66 y.o.f w/ history significant of laparoscopic partial colectomy in 01/2021 2/2 cecal mass, thyroidectomy, brain aneurysm, chronic anticoagulation, hypertension, hypothyroidism, CVA, sacral osteomyelitis, wheelchair-bound presented from SNF complaining of abdominal pain, nausea, vomiting, poor appetite  x 5 days, last BM 5 days PTA. In the ED, vital signs noted for tachycardia otherwise fairly stable, labs noted for mild anemia, otherwise stable.  CT abdomen/pelvis showed high-grade SBO likely due to adhesions, large hiatal hernia.  Surgery consulted and patient admitted for further management  Patient has failed to progress despite having NG tube decompression IV fluid hydration. She was noted to have tachycardia abnormal EKG, no chest pain troponins are negative, EKG shows sinus tachycardia. Echocardiogram showed decreased EF 30-35%. Labs with improved/stable renal function, stable hemoglobin at 10.2 g.    Subjective: Seen and examined this morning.  Resting comfortably on nasal cannula and NG tube in place. Was on phone with daughter and updated her over the phone Overnight afebrile, labs shows downtrending hemoglobin. NG output 900cc  Assessment and Plan: Principal Problem:   SBO (small bowel obstruction) (HCC) Active Problems:   Essential hypertension   Anemia, iron deficiency   Hypothyroidism   Hypercoagulable state ([place])   Hypokalemia   Chronic anticoagulation   Cecum mass   Sacral osteomyelitis (HCC)   AKI (acute kidney injury) (HCC)   Pressure ulcer of sacral region, stage 3 (HCC)   Cardiomyopathy (HCC)   Acute combined systolic and diastolic heart failure (HCC)   SBO  Previous laparoscopic partial colectomy for cecal mass: Has failed to improve with NG decompression, surgery planning for operative intervention this morning, seen by  cardiology, on TPN-hold for now, continue. IV fluids, NGT, pain control.  Further plan as per surgical team.  Hypernatremia w/ Hyperchloremia: Sodium chloride normalized.  Continue TPN once able Hypokalemia improving, continue TPN and adjust per pharmacy   Cardiomyopathy w/ new acute systolic dysfunction with EF 30 to 35% Essential hypertension Sinus tachycardia: Patient's tachycardia, low EF-new systolic dysfunction likely in the setting of acute illness, appreciate cardiology input, cont prn Lopressor if persistent tachycardia.  Cardiology has advised repeat echo once acute illness resolves and outpatient CHF follow-up, currently compensated.We will monitor intake output, watch for fluid overload- noted some wt changes-if symptomatic add lasix.Net IO Since Admission: 1,645.33 mL [12/28/21 0723]  Filed Weights   12/20/21 1823 12/23/21 0600 12/26/21 1230  Weight: 79.7 kg 80.4 kg 84.1 kg    Normocytic anemia Iron deficiency Anemia of chronic disease:  Chronic. Had been stable in 9- 10 g range.  Slightly downtrending, monitor transfuse if less than 7 g. Recent Labs  Lab 12/24/21 0228 12/25/21 0439 12/26/21 0458 12/27/21 0433 12/28/21 0314  HGB 10.1* 10.1* 10.2* 9.8* 8.8*  HCT 30.0* 29.6* 30.0* 28.5* 25.5*    Fever-several days ago, no recurrence.UA unremarkable , blood cultures x2 NGTD,cxr-possible atelectasis.   Acute urine retention-resolved.  External catheter in place Hypothyroidism: Continue IV Synthroid  Hypercoagulable state-history of DVT in 2018-on Xarelto PTA.  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  Xarelto on hold- On heparin drip - hold per pharmacy/[facility] periop.  AKI: Mild,- has resolved. GERD continue IV PPI. HLD cont holding statin. Hypophosphatemia resolved Goals of care full code, prognosis remains to be seen given patient's acute multiple medical problems including bowel obstruction and acute systolic  dysfunction.  Stage III pressure  ulcer on sacrum POA: Continue wound care and offloading Pressure Injury 12/20/21 Sacrum Posterior Stage 3 -  Full thickness tissue loss. Subcutaneous fat may be visible but bone, tendon or muscle are NOT exposed. Red (Active)  12/20/21 1950  Location:Sacrum  Location Orientation:Posterior  Staging:Stage 3-Full thickness tissue loss. Subcutaneous fat may be visible but bone, tendon or muscle are NOT exposed.  Wound Description (Comments):Red  Present on Admission:Yes  Dressing Type Foam:Lift dressing to assess site every shift. 12/26/21 1008  DVT prophylaxis: SCDs Start: 12/20/21 1811 Code Status:   Code Status: Full Code Family Communication: plan of care discussed with patient at bedside. I had called to update daughter Shirlee Limerick and Son Mr Barletta but no answer 6/28-  and then proceeded to call sister and updated her- all questions were answered. This morning I updated patient's daughter over the phone. Patient status is: INPATIENT because of FOR SBO Level of care: Telemetry   Dispo: The patient is from: HOME            Anticipated disposition: TBD PENDING SBO resolution/Surgery  Mobility Assessment (last 72 hours)     Mobility Assessment     Row Name 12/27/21 2151 12/26/21 1910 12/26/21 1008 12/25/21 2124 12/25/21 1003   Does patient have an order for bedrest or is patient medically unstable Yes- Bedfast (Level 1) - Complete Yes- Bedfast (Level 1) - Complete -- Yes- Bedfast (Level 1) - Complete Yes- Bedfast (Level 1) - Complete   What is the highest level of mobility based on the progressive mobility assessment? Level 1 (Bedfast) - Unable to balance while sitting on edge of bed Level 1 (Bedfast) - Unable to balance while sitting on edge of bed Level 1 (Bedfast) - Unable to balance while sitting on edge of bed Level 1 (Bedfast) - Unable to balance while sitting on edge of bed Level 1 (Bedfast) - Unable to balance while sitting on edge of bed   Is the above  level different from baseline mobility prior to current illness? No - Consider discontinuing PT/OT No - Consider discontinuing PT/OT No - Consider discontinuing PT/OT No - Consider discontinuing PT/OT No - Consider discontinuing PT/OT           Objective: Vitals last 24 hrs: Vitals:   12/27/21 0533 12/27/21 1357 12/27/21 2140 12/28/21 0544  BP: 105/82 119/83 (!) 143/79 123/85  Pulse: (!) 116 (!) 105 (!) 110 (!) 104  Resp: '18 18 18 18  '$ Temp: 98 F (36.7 C) 98.2 F (36.8 C) 98.7 F (37.1 C) 98 F (36.7 C)  TempSrc: Oral Oral Oral Oral  SpO2: 99% 100% 100% 100%  Weight:      Height:       Weight change:   Physical Examination: General exam: AA0x3, ngt+ ,[place]+ older than stated age, weak appearing. HEENT:Oral mucosa moist, Ear/Nose WNL grossly, dentition normal. Respiratory system: bilaterally clear, no use of accessory muscle Cardiovascular system: S1 & S2 +, No JVD,. Gastrointestinal system: Abdomen soft, BS absent Nervous System:Alert, awake, moving extremities and grossly nonfocal Extremities: LE ankle edema neg, distal peripheral pulses palpable.  Skin: No rashes,no icterus. MSK: Normal muscle bulk,tone, power    Medications reviewed:  Scheduled Meds:  Chlorhexidine Gluconate Cloth  6 each Topical Daily   diatrizoate meglumine-sodium  90 mL Per NG tube Once   fluticasone  1 spray Each Nare BID   insulin aspart  0-9 Units Subcutaneous Q6H   levothyroxine  100 mcg Intravenous Daily   pantoprazole (PROTONIX) IV  40  mg Intravenous Q12H   sodium chloride flush  10-40 mL Intracatheter Q12H  Continuous Infusions:  dextrose 5 % and 0.45% NaCl 10 mL/hr at 12/28/21 0644   TPN ADULT (ION) 40 mL/hr at 12/28/21 2956   Diet Order             Diet NPO time specified Except for: Ice Chips  Diet effective now                 Nutrition Problem: Inadequate oral intake Etiology: acute illness, nausea, vomiting Signs/Symptoms: NPO status Interventions: TPN   Intake/Output  Summary (Last 24 hours) at 12/28/2021 0723 Last data filed at 12/28/2021 0644 Gross per 24 hour  Intake 2186.2 ml  Output 1300 ml  Net 886.2 ml    Net IO Since Admission: 1,645.33 mL [12/28/21 0723]  Wt Readings from Last 3 Encounters:  12/26/21 84.1 kg  09/11/21 77.1 kg  01/31/21 89.1 kg     Unresulted Labs (From admission, onward)     Start     Ordered   12/28/21 0627  Type and screen [place],   STAT       Comments: [place]    12/28/21 0626   12/27/21 0500  Comprehensive metabolic panel  (TPN Lab Panel)  Every Mon,Thu (0500),   R      12/26/21 1229   12/27/21 0500  Magnesium  (TPN Lab Panel)  Every Mon,Thu (0500),   R      12/26/21 1229   12/27/21 0500  Phosphorus  (TPN Lab Panel)  Every Mon,Thu (0500),   R      12/26/21 1229   12/27/21 0500  Triglycerides  (TPN Lab Panel)  Every Mon,Thu (0500),   R      12/26/21 1229   12/22/21 0500  CBC with Differential/Platelet  Daily,   R      12/21/21 1543          Data Reviewed: I have personally reviewed following labs and imaging studies CBC: Recent Labs  Lab 12/24/21 0228 12/25/21 0439 12/26/21 0458 12/27/21 0433 12/28/21 0314  WBC 4.2 6.7 6.6 8.4 9.0  NEUTROABS 2.5 4.4 4.6 6.4 6.9  HGB 10.1* 10.1* 10.2* 9.8* 8.8*  HCT 30.0* 29.6* 30.0* 28.5* 25.5*  MCV 77.9* 77.1* 77.7* 77.0* 76.3*  PLT 253 257 248 239 213    Basic Metabolic Panel: Recent Labs  Lab 12/22/21 0515 12/23/21 0453 12/24/21 0228 12/25/21 0439 12/26/21 0458 12/27/21 0433 12/28/21 0314  NA 145   < > 145 142 138 141 138  K 3.4*   < > 3.0* 3.3* 3.9 3.3* 3.4*  CL 111   < > 111 108 106 108 104  CO2 19*   < > '27 28 26 27 28  '$ GLUCOSE 84   < > 130* 162* 112* 108* 112*  BUN 33*   < > '19 16 13 9 10  '$ CREATININE 1.03*   < > 0.74 0.77 0.63 0.66 0.53  CALCIUM 8.2*   < > 8.7* 8.2* 7.9* 8.0* 7.7*  MG 2.2  --  2.2  --   --  1.8 1.8  PHOS  --   --   --   --   --  2.2* 3.6   < > = values in this  interval not displayed.    GFR: Estimated Creatinine Clearance: 73.5 mL/min (by C-G formula based on SCr of 0.53 mg/dL). Liver Function Tests: Recent Labs  Lab  12/27/21 0433  AST 10*  ALT 8  ALKPHOS 74  BILITOT 0.3  PROT 6.9  ALBUMIN 2.1*    No results for input(s): "LIPASE", "AMYLASE" in the last 168 hours.  No results for input(s): "AMMONIA" in the last 168 hours. Coagulation Profile: No results for input(s): "INR", "PROTIME" in the last 168 hours.  BNP (last 3 results) No results for input(s): "PROBNP" in the last 8760 hours. HbA1C: Recent Labs    12/27/21 0433  HGBA1C 5.2    CBG: Recent Labs  Lab 12/25/21 2109 12/27/21 1147 12/27/21 1756 12/27/21 2358 12/28/21 0538  GLUCAP 110* 114* 92 136* 126*    Lipid Profile: Recent Labs    12/27/21 0433  TRIG 76    Thyroid Function Tests: No results for input(s): "TSH", "T4TOTAL", "FREET4", "T3FREE", "THYROIDAB" in the last 72 hours. Sepsis Labs: No results for input(s): "PROCALCITON", "LATICACIDVEN" in the last 168 hours.   Recent Results (from the past 240 hour(s))  Resp Panel by RT-PCR (Flu A&B, Covid) Anterior Nasal Swab     Status: None   Collection Time: 12/20/21 12:40 PM   Specimen: Anterior Nasal Swab  Result Value Ref Range Status   SARS Coronavirus 2 by RT PCR NEGATIVE NEGATIVE Final    Comment: (NOTE) SARS-CoV-2 target nucleic acids are NOT DETECTED.  The SARS-CoV-2 RNA is generally detectable in upper respiratory specimens during the acute phase of infection. The lowest concentration of SARS-CoV-2 viral copies this assay can detect is 138 copies/mL. A negative result does not preclude SARS-Cov-2 infection and should not be used as the sole basis for treatment or other patient management decisions. A negative result may occur with  improper specimen collection/handling, submission of specimen other than nasopharyngeal swab, presence of viral mutation(s) within the areas targeted by this  assay, and inadequate number of viral copies(<138 copies/mL). A negative result must be combined with clinical observations, patient history, and epidemiological information. The expected result is Negative.  Fact Sheet for Patients:  EntrepreneurPulse.com.au  Fact Sheet for Healthcare Providers:  IncredibleEmployment.be  This test is no t yet approved or cleared by the [place] FDA and  has been authorized for detection and/or diagnosis of SARS-CoV-2 by FDA under an Emergency Use Authorization (EUA). This EUA will remain  in effect (meaning this test can be used) for the duration of the COVID-19 declaration under Section 564(b)(1) of the Act, 21 U.S.C.section 360bbb-3(b)(1), unless the authorization is terminated  or revoked sooner.       Influenza A by PCR NEGATIVE NEGATIVE Final   Influenza B by PCR NEGATIVE NEGATIVE Final    Comment: (NOTE) The Xpert Xpress SARS-CoV-2/FLU/RSV plus assay is intended as an aid in the diagnosis of influenza from Nasopharyngeal swab specimens and should not be used as a sole basis for treatment. Nasal washings and aspirates are unacceptable for Xpert Xpress SARS-CoV-2/FLU/RSV testing.  Fact Sheet for Patients: EntrepreneurPulse.com.au  Fact Sheet for Healthcare Providers: IncredibleEmployment.be  This test is not yet approved or cleared by the [place] FDA and has been authorized for detection and/or diagnosis of SARS-CoV-2 by FDA under an Emergency Use Authorization (EUA). This EUA will remain in effect (meaning this test can be used) for the duration of the COVID-19 declaration under Section 564(b)(1) of the Act, 21 U.S.C. section 360bbb-3(b)(1), unless the authorization is terminated or revoked.  Performed at [facility], [place] [address], [place], [place] [address]   Culture, blood (Routine X 2) w Reflex to ID Panel  Status: None    Collection Time: 12/21/21  7:54 AM   Specimen: BLOOD  Result Value Ref Range Status   Specimen Description   Final    BLOOD LEFT ANTECUBITAL Performed at [facility] [address], [place], [place] [address]    Special Requests   Final    BOTTLES DRAWN AEROBIC AND ANAEROBIC Blood Culture adequate volume Performed at [facility] [address], [place], [place] [address]    Culture   Final    NO GROWTH 5 DAYS Performed at [facility], [place] [address], [place], [place] [address]    Report Status 12/26/2021 FINAL  Final  Culture, blood (Routine X 2) w Reflex to ID Panel     Status: None   Collection Time: 12/21/21  8:00 AM   Specimen: BLOOD RIGHT HAND  Result Value Ref Range Status   Specimen Description   Final    BLOOD RIGHT HAND Performed at [facility] [address], [place], [place] [address]    Special Requests   Final    BOTTLES DRAWN AEROBIC ONLY Blood Culture adequate volume Performed at [facility] [address] [place], [place] [address]    Culture   Final    NO GROWTH 5 DAYS Performed at [facility], [place] [address], [place], [place] [address]    Report Status 12/26/2021 FINAL  Final    Antimicrobials: Anti-infectives (From admission, onward)    None      Culture/Microbiology    Component Value Date/Time   SDES  12/21/2021 0800    BLOOD RIGHT HAND Performed at [facility], [place] [address], [place], [place] [address]    SPECREQUEST  12/21/2021 0800    BOTTLES DRAWN AEROBIC ONLY Blood Culture adequate volume Performed at [facility] [address] [place], [place] [address]    CULT  12/21/2021 0800    NO GROWTH 5 DAYS Performed at [facility], [place] [address] [place], [place] [address]    REPTSTATUS 12/26/2021 FINAL 12/21/2021 0800    Other culture-see note  Radiology Studies: VAS [place] LOWER EXTREMITY  VENOUS (DVT)  Result Date: 12/26/2021  Lower Venous DVT Study Patient Name:  LYNORA DYMOND  Date of Exam:   12/26/2021 Medical Rec #: [id number]        Accession #:    [id number] Date of Birth: 02/15/1956        Patient Gender: F Patient Age:   49 years Exam Location:  [facility] Procedure:      VAS [place] LOWER EXTREMITY VENOUS (DVT) Referring Phys: [place] --------------------------------------------------------------------------------  Indications: Swelling.  Risk Factors: DVT. Limitations: Poor ultrasound/tissue interface and patient positioning, patient immobility, patient pain tolerance. Comparison Study: 07/25/2016 - - Findings consistent with acute deep vein                   thrombosis involving the                   right common femoral vein, right profunda femoris vein, right                   femoral vein, right popliteal vein, right posterial tibial                   vein,  and right peroneal vein.                   - Findings consistent with acute superficial vein thrombosis                   involving the right small saphenous vein.                   - No evidence of deep vein thrombosis involving the left lower                   extremity.                   - No evidence of Baker&'s cyst on the right or left. Performing Technologist: Oliver Hum RVT  Examination Guidelines: A complete evaluation includes B-mode imaging, spectral Doppler, color Doppler, and power Doppler as needed of all accessible portions of each vessel. Bilateral testing is considered an integral part of a complete examination. Limited examinations for reoccurring indications may be performed as noted. The reflux portion of the exam is performed with the patient in reverse Trendelenburg.  +---------+---------------+---------+-----------+----------+--------------+ RIGHT    CompressibilityPhasicitySpontaneityPropertiesThrombus Aging  +---------+---------------+---------+-----------+----------+--------------+ CFV      Partial        Yes      Yes                  Chronic        +---------+---------------+---------+-----------+----------+--------------+ SFJ      Full                                                        +---------+---------------+---------+-----------+----------+--------------+ FV Prox  Full                                                        +---------+---------------+---------+-----------+----------+--------------+ FV Mid                  Yes      Yes                                 +---------+---------------+---------+-----------+----------+--------------+ FV DistalFull                                                        +---------+---------------+---------+-----------+----------+--------------+ PFV      Full                                                        +---------+---------------+---------+-----------+----------+--------------+ POP      Full           Yes      Yes                                 +---------+---------------+---------+-----------+----------+--------------+  PTV      Full                                                        +---------+---------------+---------+-----------+----------+--------------+ PERO     Full                                                        +---------+---------------+---------+-----------+----------+--------------+   +---------+---------------+---------+-----------+----------+-------------------+ LEFT     CompressibilityPhasicitySpontaneityPropertiesThrombus Aging      +---------+---------------+---------+-----------+----------+-------------------+ CFV      Full           Yes      Yes                                      +---------+---------------+---------+-----------+----------+-------------------+ SFJ      Full                                                              +---------+---------------+---------+-----------+----------+-------------------+ FV Prox  Full                                                             +---------+---------------+---------+-----------+----------+-------------------+ FV Mid   Full                                                             +---------+---------------+---------+-----------+----------+-------------------+ FV Distal               Yes      Yes                                      +---------+---------------+---------+-----------+----------+-------------------+ PFV      Full                                                             +---------+---------------+---------+-----------+----------+-------------------+ POP      Full           Yes      Yes                                      +---------+---------------+---------+-----------+----------+-------------------+ PTV  Full                                                             +---------+---------------+---------+-----------+----------+-------------------+ PERO                                                  Not well visualized +---------+---------------+---------+-----------+----------+-------------------+     Summary: RIGHT: - 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 - No cystic structure found in the popliteal fossa.  LEFT: - There is no evidence of deep vein thrombosis in the lower extremity. However, portions of this examination were limited- see technologist comments above.  - No cystic structure found in the popliteal fossa.  *See table(s) above for measurements and observations. Electronically signed by Orlie Pollen on 12/26/2021 at 5:31:11 PM.    Final    ECHOCARDIOGRAM COMPLETE  Result Date: 12/26/2021    ECHOCARDIOGRAM REPORT   Patient Name:   TIMOTHEA BODENHEIMER Date of Exam: 12/26/2021 Medical Rec #:  [id number]       Height:       66.0 in Accession #:     [id number]      Weight:       177.2 lb Date of Birth:  25-Sep-1955       BSA:          1.900 m Patient Age:    60 years        BP:           125/82 mmHg Patient Gender: F               HR:           120 bpm. Exam Location:  Inpatient Procedure: 2D Echo, Color Doppler and Cardiac Doppler Indications:    Abnormal ekg  History:        Patient has no prior history of Echocardiogram examinations.  Sonographer:    Joette Catching RCS Referring Phys: [id number] Creola Donna  Sonographer Comments: Technically challenging study due to limited acoustic windows. Supine scan. IMPRESSIONS  1. Left ventricular ejection fraction, by estimation, is 30 to 35%. The left ventricle has moderately decreased function. The left ventricle demonstrates global hypokinesis. Left ventricular diastolic parameters are consistent with Grade I diastolic dysfunction (impaired relaxation).  2. Right ventricular systolic function is normal. The right ventricular size is normal.  3. The mitral valve is normal in structure. No evidence of mitral valve regurgitation. No evidence of mitral stenosis.  4. The aortic valve is calcified. There is mild calcification of the aortic valve. There is mild thickening of the aortic valve. Aortic valve regurgitation is not visualized. Aortic valve sclerosis is present, with no evidence of aortic valve stenosis.  5. The inferior vena cava is normal in size with greater than 50% respiratory variability, suggesting right atrial pressure of 3 mmHg. FINDINGS  Left Ventricle: Left ventricular ejection fraction, by estimation, is 30 to 35%. The left ventricle has moderately decreased function. The left ventricle demonstrates global hypokinesis. The left ventricular internal cavity size was normal in size. There is no left ventricular hypertrophy. Abnormal (paradoxical) septal motion, consistent with left bundle branch  block. Left ventricular diastolic parameters are consistent with Grade I diastolic dysfunction (impaired  relaxation). Right Ventricle: The right ventricular size is normal. No increase in right ventricular wall thickness. Right ventricular systolic function is normal. Left Atrium: Left atrial size was normal in size. Right Atrium: Right atrial size was normal in size. Pericardium: There is no evidence of pericardial effusion. Mitral Valve: The mitral valve is normal in structure. No evidence of mitral valve regurgitation. No evidence of mitral valve stenosis. Tricuspid Valve: The tricuspid valve is normal in structure. Tricuspid valve regurgitation is not demonstrated. No evidence of tricuspid stenosis. Aortic Valve: The aortic valve is calcified. There is mild calcification of the aortic valve. There is mild thickening of the aortic valve. Aortic valve regurgitation is not visualized. Aortic valve sclerosis is present, with no evidence of aortic valve stenosis. Aortic valve mean gradient measures 4.0 mmHg. Aortic valve peak gradient measures 6.2 mmHg. Aortic valve area, by VTI measures 2.26 cm. Pulmonic Valve: The pulmonic valve was normal in structure. Pulmonic valve regurgitation is not visualized. No evidence of pulmonic stenosis. Aorta: The aortic root is normal in size and structure. Venous: The inferior vena cava is normal in size with greater than 50% respiratory variability, suggesting right atrial pressure of 3 mmHg. IAS/Shunts: No atrial level shunt detected by color flow Doppler.  LEFT VENTRICLE PLAX 2D LVIDd:         3.80 cm   Diastology LVIDs:         3.30 cm   LV e' medial:    5.22 cm/s LV PW:         1.00 cm   LV E/e' medial:  10.0 LV IVS:        0.90 cm   LV e' lateral:   5.77 cm/s LVOT diam:     2.30 cm   LV E/e' lateral: 9.1 LV SV:         30 LV SV Index:   16 LVOT Area:     4.15 cm  RIGHT VENTRICLE             IVC RV S prime:     29.70 cm/s  IVC diam: 1.70 cm TAPSE (M-mode): 2.1 cm LEFT ATRIUM             Index LA diam:        2.10 cm 1.11 cm/m LA Vol (A2C):   20.0 ml 10.53 ml/m LA Vol (A4C):    57.6 ml 30.32 ml/m LA Biplane Vol: 35.8 ml 18.84 ml/m  AORTIC VALVE                    PULMONIC VALVE AV Area (Vmax):    2.08 cm     PV Vmax:          1.15 m/s AV Area (Vmean):   2.10 cm     PV Peak grad:     5.3 mmHg AV Area (VTI):     2.26 cm     PR End Diast Vel: 10.76 msec AV Vmax:           125.00 cm/s AV Vmean:          94.000 cm/s AV VTI:            0.134 m AV Peak Grad:      6.2 mmHg AV Mean Grad:      4.0 mmHg LVOT Vmax:         62.70 cm/s LVOT Vmean:  47.600 cm/s LVOT VTI:          0.073 m LVOT/AV VTI ratio: 0.54  AORTA Ao Root diam: 3.40 cm Ao Asc diam:  3.30 cm MITRAL VALVE               TRICUSPID VALVE MV Area (PHT): 5.84 cm    TR Peak grad:   25.0 mmHg MV Decel Time: 130 msec    TR Vmax:        250.00 cm/s MV E velocity: 52.30 cm/s MV A velocity: 78.40 cm/s  SHUNTS MV E/A ratio:  0.67        Systemic VTI:  0.07 m                            Systemic Diam: 2.30 cm Candee Furbish MD Electronically signed by Candee Furbish MD Signature Date/Time: 12/26/2021/5:05:24 PM    Final    [place] EKG SITE RITE  Result Date: 12/26/2021 If Site Rite image not attached, placement could not be confirmed due to current cardiac rhythm.  DG Abd Portable 1V  Result Date: 12/26/2021 CLINICAL DATA:  Small-bowel obstruction EXAM: PORTABLE ABDOMEN - 1 VIEW COMPARISON:  12/25/2021 FINDINGS: Persistent dilated loops of small bowel particularly in the left abdomen. Maximum measurable distension is decreased. Calcified uterine fibroid is again noted. IMPRESSION: Persistent small bowel obstruction. Maximum distension measures decreased. Electronically Signed   By: Macy Mis M.D.   On: 12/26/2021 08:24     LOS: 8 days   Antonieta Pert, MD [facility]  12/28/2021, 7:23 AM

## 2021-12-28 NOTE — Progress Notes (Signed)
PT Cancellation Note  Patient Details Name: Katrina Donaldson MRN: [id number] DOB: [birth year]   Cancelled Treatment:    Reason Eval/Treat Not Completed: PT screened, no needs identified, will sign off  Patient is to return to [facility] per MSW, No skilled PT needs. Patient  requires total assistance for mobility per previous notes. [place] Office [phone] Weekend [phone]    [name] 12/28/2021, 3:45 PM

## 2021-12-28 NOTE — Anesthesia Procedure Notes (Signed)
Arterial Line Insertion Start/End6/30/2023 11:30 AM, 12/28/2021 11:30 AM Performed by: Pervis Hocking, DO, anesthesiologist  Patient location: OR. Preanesthetic checklist: patient identified, IV checked, site marked, risks and benefits discussed, surgical consent, monitors and equipment checked, pre-op evaluation, timeout performed and anesthesia consent Lidocaine 1% used for infiltration Left, radial was placed Catheter size: 20 G Hand hygiene performed  and maximum sterile barriers used   Attempts: 1 Procedure performed without using ultrasound guided technique. Following insertion, dressing applied. Post procedure assessment: normal and unchanged

## 2021-12-28 NOTE — Op Note (Signed)
PRE-OPERATIVE DIAGNOSIS: small bowel obstruction  POST-OPERATIVE DIAGNOSIS:  Same  PROCEDURE:  Procedure(s): Exploratory laparotomy and lysis of adhesions.   SURGEON:  Surgeon(s): Stark Klein, MD  ASSISTANT: Alferd Apa, PA=C  ANESTHESIA:   general  DRAINS: none   LOCAL MEDICATIONS USED:  NONE  SPECIMEN:  Source of Specimen:  mesenteric nodule  DISPOSITION OF SPECIMEN:  PATHOLOGY  COUNTS:  YES  DICTATION: .Dragon Dictation  PLAN OF CARE:  return to floor  PATIENT DISPOSITION:  PACU - hemodynamically stable.  FINDINGS:  two bands that were independently causing obstruction  EBL: min  PROCEDURE:   Patient was identified in the holding area and taken the operating room where she was placed supine on the operating room table.  Sequential compression devices were hooked up.  General anesthesia was induced.  An arterial line was placed as well as a Foley catheter.  Her abdomen was then prepped and draped in sterile fashion.  A timeout was performed according to the surgical safety checklist.  When all was correct, we continued.   The patient had previously had a midline incision above the umbilicus that had healed by secondary intention.  This previous scar was excised with cautery.  The subcutaneous tissues were divided with the cautery.  Once the fascia was encountered it was elevated and opened sharply with a scalpel.  The fascia was divided the length of the skin incision with the cautery.  Immediately encountered was dilated small bowel.  This was grasped and elevated and a band was immediately found to the sigmoid colon.  This was divided.    The bowel was then run both directions and additional site of obstruction was found in the right upper quadrant.  There were also adhesions of the small intestine in the left upper quadrant.  These adhesions were taken down with a combination of gentle blunt dissection and sharp dissection.  Not unexpectedly, 2 serosal defects were  seen.  These were imbricated with 3-0 Vicryl pops.  The bowel was then run again.  Some of the succus was milked forward to go past the obstruction and this passed easily.  NG tube placement was confirmed in the stomach.  The abdomen was then irrigated.  The fascia was closed with running 0 looped PDS sutures x2.  Skin was irrigated and then reapproximated with skin staples.  The patient was allowed to emerge from anesthesia and was taken to the PACU in stable condition.  Needle, sponge, and instrument counts are correct x2.

## 2021-12-28 NOTE — Interval H&P Note (Signed)
History and Physical Interval Note:  12/28/2021 9:54 AM  Katrina Donaldson  has presented today for surgery, with the diagnosis of Bowel Obstruction.  The various methods of treatment have been discussed with the patient and family. After consideration of risks, benefits and other options for treatment, the patient has consented to  Procedure(s): EXPLORATORY LAPAROTOMY, LYSIS OF ADHESIONS, POSSIBLE BOWEL RESECTION (N/A) as a surgical intervention.  The patient's history has been reviewed, patient examined, no change in status, stable for surgery.  I have reviewed the patient's chart and labs.  Questions were answered to the patient's satisfaction.     Stark Klein

## 2021-12-29 ENCOUNTER — Encounter (HOSPITAL_COMMUNITY): Payer: Self-pay | Admitting: General Surgery

## 2021-12-29 DIAGNOSIS — K56609 Unspecified intestinal obstruction, unspecified as to partial versus complete obstruction: Secondary | ICD-10-CM | POA: Diagnosis not present

## 2021-12-29 LAB — CBC WITH DIFFERENTIAL/PLATELET
Abs Immature Granulocytes: 0.27 10*3/uL — ABNORMAL HIGH (ref 0.00–0.07)
Basophils Absolute: 0 10*3/uL (ref 0.0–0.1)
Basophils Relative: 0 %
Eosinophils Absolute: 0 10*3/uL (ref 0.0–0.5)
Eosinophils Relative: 0 %
HCT: 27.3 % — ABNORMAL LOW (ref 36.0–46.0)
Hemoglobin: 9.7 g/dL — ABNORMAL LOW (ref 12.0–15.0)
Immature Granulocytes: 2 %
Lymphocytes Relative: 6 %
Lymphs Abs: 0.9 10*3/uL (ref 0.7–4.0)
MCH: 26.3 pg (ref 26.0–34.0)
MCHC: 35.5 g/dL (ref 30.0–36.0)
MCV: 74 fL — ABNORMAL LOW (ref 80.0–100.0)
Monocytes Absolute: 0.7 10*3/uL (ref 0.1–1.0)
Monocytes Relative: 5 %
Neutro Abs: 12.3 10*3/uL — ABNORMAL HIGH (ref 1.7–7.7)
Neutrophils Relative %: 87 %
Platelets: 208 10*3/uL (ref 150–400)
RBC: 3.69 MIL/uL — ABNORMAL LOW (ref 3.87–5.11)
RDW: 17.2 % — ABNORMAL HIGH (ref 11.5–15.5)
WBC: 14.2 10*3/uL — ABNORMAL HIGH (ref 4.0–10.5)
nRBC: 0 % (ref 0.0–0.2)

## 2021-12-29 LAB — HEPARIN LEVEL (UNFRACTIONATED)
Heparin Unfractionated: 0.3 IU/mL (ref 0.30–0.70)
Heparin Unfractionated: 0.36 IU/mL (ref 0.30–0.70)
Heparin Unfractionated: 0.43 IU/mL (ref 0.30–0.70)

## 2021-12-29 LAB — FERRITIN: Ferritin: 514 ng/mL — ABNORMAL HIGH (ref 11–307)

## 2021-12-29 LAB — BASIC METABOLIC PANEL
Anion gap: 6 (ref 5–15)
BUN: 12 mg/dL (ref 8–23)
CO2: 28 mmol/L (ref 22–32)
Calcium: 8.2 mg/dL — ABNORMAL LOW (ref 8.9–10.3)
Chloride: 107 mmol/L (ref 98–111)
Creatinine, Ser: 0.43 mg/dL — ABNORMAL LOW (ref 0.44–1.00)
GFR, Estimated: >60 mL/min (ref >60)
Glucose, Bld: 168 mg/dL — ABNORMAL HIGH (ref 70–99)
Potassium: 3.5 mmol/L (ref 3.5–5.1)
Sodium: 141 mmol/L (ref 135–145)

## 2021-12-29 LAB — IRON AND TIBC
Iron: 20 ug/dL — ABNORMAL LOW (ref 28–170)
Saturation Ratios: 13 % (ref 10.4–31.8)
TIBC: 148 ug/dL — ABNORMAL LOW (ref 250–450)
UIBC: 128 ug/dL

## 2021-12-29 LAB — PHOSPHORUS: Phosphorus: 1.8 mg/dL — ABNORMAL LOW (ref 2.5–4.6)

## 2021-12-29 LAB — GLUCOSE, CAPILLARY
Glucose-Capillary: 147 mg/dL — ABNORMAL HIGH (ref 70–99)
Glucose-Capillary: 149 mg/dL — ABNORMAL HIGH (ref 70–99)
Glucose-Capillary: 159 mg/dL — ABNORMAL HIGH (ref 70–99)
Glucose-Capillary: 168 mg/dL — ABNORMAL HIGH (ref 70–99)

## 2021-12-29 LAB — MAGNESIUM: Magnesium: 1.8 mg/dL (ref 1.7–2.4)

## 2021-12-29 MED ORDER — TRAVASOL 10 % IV SOLN
INTRAVENOUS | Status: AC
  Filled 2021-12-29: qty 902.4

## 2021-12-29 MED ORDER — METOPROLOL TARTRATE 5 MG/5ML IV SOLN
2.5000 mg | Freq: Four times a day (QID) | INTRAVENOUS | Status: DC | PRN
Start: 2021-12-29 — End: 2021-12-30
  Administered 2021-12-29 – 2021-12-30 (×2): 2.5 mg via INTRAVENOUS
  Filled 2021-12-29 (×2): qty 5

## 2021-12-29 MED ORDER — HEPARIN (PORCINE) 25000 UT/250ML-% IV SOLN
1450.0000 [IU]/h | INTRAVENOUS | Status: DC
Start: 2021-12-29 — End: 2021-12-30
  Administered 2021-12-29 – 2021-12-30 (×2): 1450 [IU]/h via INTRAVENOUS
  Filled 2021-12-29 (×3): qty 250

## 2021-12-29 MED ORDER — DEXTROSE 5 % IV SOLN
30.0000 mmol | Freq: Once | INTRAVENOUS | Status: AC
  Administered 2021-12-29: 30 mmol via INTRAVENOUS
  Filled 2021-12-29: qty 10

## 2021-12-29 NOTE — Progress Notes (Signed)
PHARMACY - TOTAL PARENTERAL NUTRITION CONSULT NOTE   Indication: Small bowel obstruction  Patient Measurements: Height: '5\' 6"'$  (167.6 cm) Weight: 84.1 kg (185 lb 6.5 oz) IBW/kg (Calculated) : 59.3 TPN AdjBW (KG): 65.5 Body mass index is 29.93 kg/m.  Assessment:  66 yo F presents with abdominal pain, nausea, vomiting, and poor appetite x 5 days. 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 No BM for 5 days as well. NG output was 1.6 L and xray shows SBO. Going for ex lap, lysis of adhesions and possible bowel resection on 6/29. Does report shellfish allergy but not any fish. Reacted to shrimp but not crab and was not an anaphylactic reaction. Pharmacy consulted for TPN.  Glucose / Insulin: No hx of DM. CBGs ok. SSI used 7 units / 24 hrs. - 6/30 Dexamethasone '4mg'$   Electrolytes: Phos low, others WNL. Renal: SCr low, BUN wnl. Hepatic:  LFTs, Tbili, and Trig ok. Albumin low at 2.1. Intake / Output; MIVF: IVF at [facility]; UOP only charted 27m (RN reports that documentation may be inaccurate and suspect actual UOP was OK yesterday); currently amber urine per foley.  NG output 400 mL GI Surgeries / Procedures:  6/30 Exploratory laparotomy and lysis of adhesions.   Central access: PICC placed 6/29 TPN start date: 6/29  Nutritional Goals: Goal TPN rate is 80 mL/hr (provides 90 g of protein and 2,085 kcals per day)  RD Assessment: Estimated Needs Total Energy Estimated Needs: 1950-2150 Total Protein Estimated Needs: 85-95g Total Fluid Estimated Needs: 2L/day  Current Nutrition:  NPO and TPN  Plan:  Now:  KPhos 30 mmol IV x1 dose  At 18:00: Continue TPN at goal rate 875mhr Electrolytes in TPN: Na 502mL, K 74m72m, Ca 5mEq47m Mg 7mEq/61mand Phos 20mmol16mCl:Ac 1:1 Add standard MVI and trace elements to TPN Continue Sensitive q6h SSI and adjust as needed  [facility] labs on Mon/Thurs, recheck Bmet, Mag, Phos tomorrow F/U  nutrition plans after surgery.    ChristiGretta Arab, BCPS Clinical Pharmacist [facility] main pharmacy [phone] 9:26 AM

## 2021-12-29 NOTE — Progress Notes (Signed)
PT Cancellation Note  Patient Details Name: Katrina Donaldson MRN: [id number] DOB: [birth year]   Cancelled Treatment:    Reason Eval/Treat Not Completed: PT screened, no needs identified, will sign off. Order received, chart reviewed. Pt has been screened x 2 by PT-please see notes from 6/28 and 6/30. Per chart review, pt is LTC SNF resident-bed/WC bound at baseline. Recommend OOB to chair using lift equipment with nursing. Recommnend return to SNF once medically ready.     [facility]  Office: [phone] Pager: [phone]

## 2021-12-29 NOTE — Plan of Care (Signed)
?  Problem: Clinical Measurements: ?Goal: Will remain free from infection ?Outcome: Progressing ?  ?

## 2021-12-29 NOTE — Progress Notes (Signed)
[place] for heparin Indication: history of DVT on rivaroxaban PTA, hypercoagulable state  Allergies  Allergen Reactions   Shellfish-Derived Products Other (See Comments)    ALLERGIC TO SHRIMP!! Made the tongue "feel strange" Per patient, she has since eaten crab legs and did not have another reaction.     Patient Measurements: Height: '5\' 6"'$  (167.6 cm) Weight: 84.1 kg (185 lb 6.5 oz) IBW/kg (Calculated) : 59.3 Heparin Dosing Weight: 76 kg  Vital Signs: Temp: 98.4 F (36.9 C) (07/01 0411) Temp Source: Oral (07/01 0411) BP: 156/95 (07/01 0411) Pulse Rate: 125 (07/01 0411)  Labs: Recent Labs    12/26/21 0458 12/26/21 1658 12/26/21 2212 12/27/21 0433 12/27/21 2309 12/28/21 0314 12/29/21 0308  HGB 10.2*  --   --  9.8*  --  8.8* 9.7*  HCT 30.0*  --   --  28.5*  --  25.5* 27.3*  PLT 248  --   --  239  --  218 208  HEPARINUNFRC  --   --  <0.10*  --  0.39  --  0.30  CREATININE 0.63  --   --  0.66  --  0.53  --   TROPONINIHS  --  12  --   --   --   --   --      Estimated Creatinine Clearance: 73.5 mL/min (by C-G formula based on SCr of 0.53 mg/dL).   Medical History: Past Medical History:  Diagnosis Date   AKI (acute kidney injury) ([place]) 12/26/2021   Arachnoid cyst    Brain aneurysm    Chronic anticoagulation 05/26/2017   Depression    History of DVT (deep vein thrombosis)    Hypercoagulable state ([place])    Hypertension    Hypothyroidism    Stroke ([place]) 2016   [facility] 11/29/14   Wheelchair bound     Medications: Rivaroxaban 20 mg PO Daily PTA -Last dose: 6/21  Assessment: Pt is a 26 yoF admitted with SBO and initially treated with conservative management. PMH significant for DVT in 2018, hypercoagulable state on Xarelto PTA.   Xarelto held on admission, was on enoxaparin for DVT ppx from 6/22 > 6/27. Heparin drip was started on 6/27, but has been intermittently infusing as it was held for possible surgery on 6/28, 6/29, and  6/30 AM.   Patient underwent ex lap and lysis of adhesions 6/30. Heparin drip was turned off @ 0005 this pre-op.   Discussed peri-operative anticoagulation timing with CCS. Verbal order to resume heparin infusion with no bolus 8 hours post-op. AET 12:55  12/29/2021  First heparin level after resuming drip is at low end of therapeutic range CBC: Hgb low 9.7 but improved, Plts WNL No issues with infusion or bleeding reported per RN  Goal of Therapy:  Heparin level 0.3-0.7 units/ml Monitor platelets by anticoagulation protocol: Yes   Plan:  Increase heparin infusion slightly to 1450 units/hr Check 8 hour heparin level Heparin level, CBC daily  Monitor for signs of bleeding  Peggyann Juba, PharmD, BCPS Pharmacy: [phone] 12/29/21 4:35 AM

## 2021-12-29 NOTE — Progress Notes (Signed)
Progress Note: General Surgery Service   Chief Complaint/Subjective: No major complaints this AM  Objective: Vital signs in last 24 hours: Temp:  [97.3 F (36.3 C)-98.4 F (36.9 C)] 98.4 F (36.9 C) (07/01 0411) Pulse Rate:  [100-125] 125 (07/01 0411) Resp:  [12-22] 18 (07/01 0411) BP: (129-166)/(86-99) 156/95 (07/01 0411) SpO2:  [95 %-100 %] 95 % (07/01 0411) Arterial Line BP: (134-160)/(65-74) 160/74 (06/30 1315) Weight:  [84.1 kg] 84.1 kg (06/30 0949) Last BM Date : 12/16/21  Intake/Output from previous day: 06/30 0701 - 07/01 0700 In: 2378.7 [I.V.:1928.7; IV Piggyback:450] Out: 400 [Urine:350; Emesis/NG output:50] Intake/Output this shift: No intake/output data recorded.  GI: Abd Soft, incision c/d/I w/ dressing  Lab Results: CBC  Recent Labs    12/28/21 0314 12/29/21 0308  WBC 9.0 14.2*  HGB 8.8* 9.7*  HCT 25.5* 27.3*  PLT 218 208   BMET Recent Labs    12/28/21 0314 12/29/21 0308  NA 138 141  K 3.4* 3.5  CL 104 107  CO2 28 28  GLUCOSE 112* 168*  BUN 10 12  CREATININE 0.53 0.43*  CALCIUM 7.7* 8.2*   PT/INR No results for input(s): "LABPROT", "INR" in the last 72 hours. ABG No results for input(s): "PHART", "HCO3" in the last 72 hours.  Invalid input(s): "PCO2", "PO2"  Anti-infectives: Anti-infectives (From admission, onward)    Start     Dose/Rate Route Frequency Ordered Stop   12/28/21 1030  ceFAZolin (ANCEF) IVPB 2g/100 mL premix        2 g 200 mL/hr over 30 Minutes Intravenous  Once 12/28/21 1021 12/28/21 1156   12/28/21 1019  ceFAZolin (ANCEF) 2-4 GM/100ML-% IVPB       Note to Pharmacy: Christell Faith L: cabinet override      12/28/21 1019 12/28/21 1154       Medications: Scheduled Meds:  Chlorhexidine Gluconate Cloth  6 each Topical Daily   fluticasone  1 spray Each Nare BID   insulin aspart  0-9 Units Subcutaneous Q6H   levothyroxine  100 mcg Intravenous Daily   pantoprazole (PROTONIX) IV  40 mg Intravenous Q12H   sodium  chloride flush  10-40 mL Intracatheter Q12H   Continuous Infusions:  dextrose 5 % and 0.45% NaCl 10 mL/hr at 12/29/21 0641   heparin 1,450 Units/hr (12/29/21 0641)   TPN ADULT (ION) 80 mL/hr at 12/29/21 0641   PRN Meds:.acetaminophen **OR** acetaminophen, albuterol, azelastine, hydrALAZINE, hydrocortisone, lip balm, metoprolol tartrate, morphine injection, ondansetron **OR** ondansetron (ZOFRAN) IV, oxyCODONE, phenol, polyvinyl alcohol, sodium chloride, sodium chloride flush  Assessment/Plan: Ms. Spizzirri is a 66 year old female who underwent exploratory laparotomy with lysis of adhesions that were causing an adhesive small bowel obstruction on 12/28/21.    During surgery, a small mesenteric nodule was sent for permanent section - path is pending. NPO, NG, AROBF - no flatus or bowel movement yet IVF Increase activity - unable to walk at baseline, will ask PT/OT to work with patient and aid in recovery Remainder of care per primary team Remove foley   LOS: 9 days    Felicie Morn, MD  [facility] Use [email or web address] to contact on call provider  Daily Billing: [phone] - post op

## 2021-12-29 NOTE — Progress Notes (Signed)
[place] for heparin Indication: history of DVT on rivaroxaban PTA, hypercoagulable state  Allergies  Allergen Reactions   Shellfish-Derived Products Other (See Comments)    ALLERGIC TO SHRIMP!! Made the tongue "feel strange" Per patient, she has since eaten crab legs and did not have another reaction.     Patient Measurements: Height: '5\' 6"'$  (167.6 cm) Weight: 84.1 kg (185 lb 6.5 oz) IBW/kg (Calculated) : 59.3 Heparin Dosing Weight: 76 kg  Vital Signs: Temp: 98.4 F (36.9 C) (07/01 0411) Temp Source: Oral (07/01 0411) BP: 156/95 (07/01 0411) Pulse Rate: 125 (07/01 0411)  Labs: Recent Labs    12/26/21 1658 12/26/21 2212 12/27/21 0433 12/27/21 0433 12/27/21 2309 12/28/21 0314 12/29/21 0308  HGB  --   --  9.8*   < >  --  8.8* 9.7*  HCT  --   --  28.5*  --   --  25.5* 27.3*  PLT  --   --  239  --   --  218 208  HEPARINUNFRC  --  <0.10*  --   --  0.39  --  0.30  CREATININE  --   --  0.66  --   --  0.53 0.43*  TROPONINIHS 12  --   --   --   --   --   --    < > = values in this interval not displayed.     Estimated Creatinine Clearance: 73.5 mL/min (A) (by C-G formula based on SCr of 0.43 mg/dL (L)).   Medications: Rivaroxaban 20 mg PO Daily PTA.  Last dose: 6/21 Infusions:   dextrose 5 % and 0.45% NaCl 10 mL/hr at 12/29/21 0641   heparin 1,450 Units/hr (12/29/21 0641)   potassium PHOSPHATE IVPB (in mmol) 30 mmol (12/29/21 1104)   TPN ADULT (ION) 80 mL/hr at 12/29/21 0641   TPN ADULT (ION)       Assessment: Pt is a 65 yoF admitted with SBO and initially treated with conservative management. PMH significant for DVT in 2018, hypercoagulable state on Xarelto PTA.   Xarelto held on admission, was on enoxaparin for DVT ppx from 6/22 > 6/27. Heparin drip was started on 6/27, but has been intermittently infusing as it was held for possible surgery on 6/28, 6/29, and 6/30 AM.   Patient underwent ex lap and lysis of adhesions 6/30.  Heparin drip was held preop and resumed 8 hours postop.    12/29/2021  Heparin level 0.36, therapeutic on heparin 1450 units/hr CBC: Hgb low 9.7 but improved, Plts WNL No issues with infusion or bleeding reported  Goal of Therapy:  Heparin level 0.3-0.7 units/ml Monitor platelets by anticoagulation protocol: Yes   Plan:  Continue heparin infusion 1450 units/hr Check confirmatory 8 hour heparin level Heparin level, CBC daily    Gretta Arab PharmD, BCPS Clinical Pharmacist [facility] main pharmacy [phone] 12/29/2021 12:16 PM

## 2021-12-29 NOTE — Progress Notes (Addendum)
MD notified of HR up to high 120's. Orders noted, but currently unable to give prn med (can repeat again at 2008). Will cont to monitor.

## 2021-12-29 NOTE — Progress Notes (Signed)
Pharmacy Consult Note - IV heparin follow up  Labs: heparin level 0.43  A/P: heparin level therapeutic x 3 (goal 0.3-0.7) on current IV heparin rate of 1450 units/hr. Continue current rate. Daily Heparin level and CBC. NO reported bleeding or issues  Adrian Saran, PharmD, BCPS Secure Chat if ?s 12/29/2021 9:04 PM

## 2021-12-29 NOTE — Progress Notes (Signed)
Progress Note  Patient Name: Katrina Donaldson Date of Encounter: 12/29/2021  Primary Cardiologist: Skeet Latch, MD     Patient Profile     66 y.o. female seen for sinus tach in setting of acute SBO  Rx surgically 6/30 lysis of adhesions with prior partial colectomy for cecal mass  EF echo 3035% << 60-65% (2016) Morbidly obese; prior CVA with hemiplegia  Hx of DVT on heparin Subjective   Feels rough  No dyspnea or chest pain   Inpatient Medications    Scheduled Meds:  Chlorhexidine Gluconate Cloth  6 each Topical Daily   fluticasone  1 spray Each Nare BID   insulin aspart  0-9 Units Subcutaneous Q6H   levothyroxine  100 mcg Intravenous Daily   pantoprazole (PROTONIX) IV  40 mg Intravenous Q12H   sodium chloride flush  10-40 mL Intracatheter Q12H   Continuous Infusions:  dextrose 5 % and 0.45% NaCl 10 mL/hr at 12/29/21 0641   heparin 1,450 Units/hr (12/29/21 0641)   potassium PHOSPHATE IVPB (in mmol)     TPN ADULT (ION) 80 mL/hr at 12/29/21 0641   TPN ADULT (ION)     PRN Meds: acetaminophen **OR** acetaminophen, albuterol, azelastine, hydrALAZINE, hydrocortisone, lip balm, metoprolol tartrate, morphine injection, ondansetron **OR** ondansetron (ZOFRAN) IV, oxyCODONE, phenol, polyvinyl alcohol, sodium chloride, sodium chloride flush   Vital Signs    Vitals:   12/28/21 1802 12/28/21 2008 12/29/21 0002 12/29/21 0411  BP: (!) 163/91 (!) 137/92 129/87 (!) 156/95  Pulse: (!) 116 (!) 124 (!) 125 (!) 125  Resp: 16 20 (!) 22 18  Temp: (!) 97.3 F (36.3 C) 98.4 F (36.9 C) 98.4 F (36.9 C) 98.4 F (36.9 C)  TempSrc: Oral Oral Oral Oral  SpO2: 96% 96% 98% 95%  Weight:      Height:        Intake/Output Summary (Last 24 hours) at 12/29/2021 0945 Last data filed at 12/29/2021 0641 Gross per 24 hour  Intake 2378.65 ml  Output 400 ml  Net 1978.65 ml   Filed Weights   12/23/21 0600 12/26/21 1230 12/28/21 0949  Weight: 80.4 kg 84.1 kg 84.1 kg    Telemetry     Sinus tach - Personally Reviewed  ECG    6/28 - Personally Reviewed P wave morphology consistent with sinus  Physical Exam    GEN: mild distress.   Neck: JVP 9-10 Cardiac: Rapid and IRRR, no murmurs, rubs, or gallops.  Respiratory: Clear to auscultation laterally. GI: Soft, nontender, non-distended  MS: No edema; No deformity.SCDs in place Neuro:  Nonfocal  Psych: Normal affect   Labs    Chemistry Recent Labs  Lab 12/27/21 0433 12/28/21 0314 12/29/21 0308  NA 141 138 141  K 3.3* 3.4* 3.5  CL 108 104 107  CO2 '27 28 28  '$ GLUCOSE 108* 112* 168*  BUN '9 10 12  '$ CREATININE 0.66 0.53 0.43*  CALCIUM 8.0* 7.7* 8.2*  PROT 6.9  --   --   ALBUMIN 2.1*  --   --   AST 10*  --   --   ALT 8  --   --   ALKPHOS 74  --   --   BILITOT 0.3  --   --   GFRNONAA >60 >60 >60  ANIONGAP '6 6 6     '$ Hematology Recent Labs  Lab 12/27/21 0433 12/28/21 0314 12/29/21 0308  WBC 8.4 9.0 14.2*  RBC 3.70* 3.34* 3.69*  HGB 9.8* 8.8* 9.7*  HCT 28.5* 25.5* 27.3*  MCV 77.0* 76.3* 74.0*  MCH 26.5 26.3 26.3  MCHC 34.4 34.5 35.5  RDW 17.4* 17.3* 17.2*  PLT 239 218 208      Radiology    No results found.  Cardiac Studies   (As above)  Assessment & Plan    Cardiomyopathy ? Cause  Sinus tach  SBO s/p lysis of adhesions  CVA   HTN  Anemia chronic micocytic Ferritin was 8 1 year ago   Hemodynamics 2/2 stress    Continue IV metoprolol  when taking PO can begin therapy for cardiomyopathy   Will repeat iron labs   Continue heparin for DVT hx      For questions or updates, please contact [facility] Please consult [email or web address] for contact info under Cardiology/STEMI.      Signed, Virl Axe, MD  12/29/2021, 9:45 AM

## 2021-12-29 NOTE — Progress Notes (Signed)
OT Cancellation Note  Patient Details Name: Katrina Donaldson MRN: [id number] DOB: 1956-01-29   Cancelled Treatment:    Reason Eval/Treat Not Completed: OT screened, no needs identified, will sign off. Patient is a LTC SNF resident and wc/bed bound at baseline and requires assist with ADLs. Plan is to return to facility at discharge.   Yee Joss L Roger Kettles 12/29/2021, 2:32 PM

## 2021-12-29 NOTE — Progress Notes (Signed)
24 hour chart audit completed 

## 2021-12-29 NOTE — Progress Notes (Signed)
PROGRESS NOTE Talani Brazee  JYN:[id number] DOB: 07-01-1956 DOA: 12/20/2021 PCP: Donald Prose, MD   Brief Narrative/Hospital Course: 66 y.o.f w/ history significant of laparoscopic partial colectomy in 01/2021 2/2 cecal mass, thyroidectomy, brain aneurysm, chronic anticoagulation, hypertension, hypothyroidism, CVA, sacral osteomyelitis, wheelchair-bound presented from SNF complaining of abdominal pain, nausea, vomiting, poor appetite  x 5 days, last BM 5 days PTA. In the ED, vital signs noted for tachycardia otherwise fairly stable, labs noted for mild anemia, otherwise stable.  CT abdomen/pelvis showed high-grade SBO likely due to adhesions, large hiatal hernia.  Surgery consulted and patient admitted for further management  Patient has failed to progress despite having NG tube decompression IV fluid hydration. She was noted to have tachycardia abnormal EKG, no chest pain troponins are negative, EKG shows sinus tachycardia. Echocardiogram showed decreased EF 30-35%. Labs with improved/stable renal function, stable hemoglobin at 10.2 g.  Seen by cardiology.  Sbo did not improve subsequently underwent ex laparotomy with lysis of adhesion by Dr Barry Dienes 6/30    Subjective: Seen examined this morning.  NG tube in place.  Reports she had a bowel movement, pain is controlled Afebrile overnight but tachycardic up to 125 BP stable, saturating well, Labs showed leukocytosis  Assessment and Plan: Principal Problem:   SBO (small bowel obstruction) (HCC) Active Problems:   Essential hypertension   Anemia, iron deficiency   Hypothyroidism   Hypercoagulable state ([place])   Hypokalemia   Chronic anticoagulation   Cecum mass   Sacral osteomyelitis (HCC)   AKI (acute kidney injury) (HCC)   Pressure ulcer of sacral region, stage 3 (HCC)   Cardiomyopathy (HCC)   Acute combined systolic and diastolic heart failure (HCC)   SBO  Previous laparoscopic partial colectomy for cecal mass: Has failed to improve  with NG decompression- underwent ex laparotomy with lysis of adhesion by Dr Barry Dienes 6/30.  Reports having bowel movement this morning.  Continue gentle IV fluids/TPN n.p.o. NGT pain control DVT prophylaxis post op as per surgical service.  PT OT eval and ambulation.  Leukocytosis postop, abdominal likely reactive  post surgery.monitor Hypernatremia w/ Hyperchloremia: Resolved  Hypokalemia resolved - cont TPN  Cardiomyopathy w/ new acute systolic dysfunction with EF 30 to 35% Essential hypertension Sinus tachycardia: Patient's tachycardia, low EF-new systolic dysfunction likely in the setting of acute illness, appreciate cardiology input, cont prn Lopressor if persistent tachycardia.will need repeat echo once acute illness resolves and outpatient CHF follow-up.  CHF is compensated.  Continue to monitor intake output, watch for fluid overload- noted some wt changes-if symptomatic add lasix.Net IO Since Admission: 3,298.98 mL [12/29/21 1046]  Filed Weights   12/23/21 0600 12/26/21 1230 12/28/21 0949  Weight: 80.4 kg 84.1 kg 84.1 kg    Normocytic anemia Iron deficiency Anemia of chronic disease:  Holding stable 9 to 10 g range.  Monitor  Recent Labs  Lab 12/25/21 0439 12/26/21 0458 12/27/21 0433 12/28/21 0314 12/29/21 0308  HGB 10.1* 10.2* 9.8* 8.8* 9.7*  HCT 29.6* 30.0* 28.5* 25.5* 27.3*    GERD continue IV PPI. HLD cont holding statin. Fever-several days ago, no recurrence.UA unremarkable , blood cultures x2 NGTD,cxr-possible atelectasis.   Acute urine retention-resolved.  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  Xarelto on hold- On heparin drip for now per pharmacy  QMV:HQIONGEX. Hypophosphatemia resolved  Goals of care full code, prognosis remains to be seen given patient's acute multiple medical problems including bowel obstruction and acute systolic  dysfunction.  Stage  III pressure ulcer on sacrum POA: Continue wound care and offloading Pressure Injury 12/20/21 Sacrum Posterior Stage 3 -  Full thickness tissue loss. Subcutaneous fat may be visible but bone, tendon or muscle are NOT exposed. Red (Active)  12/20/21 1950  Location:Sacrum  Location Orientation:Posterior  Staging:Stage 3-Full thickness tissue loss. Subcutaneous fat may be visible but bone, tendon or muscle are NOT exposed.  Wound Description (Comments):Red  Present on Admission:Yes  Dressing Type Foam:Lift dressing to assess site every shift. 12/26/21 1008  DVT prophylaxis: SCDs Start: 12/20/21 1811 Code Status:   Code Status: Full Code Family Communication: Discussed with patient's family previously including daughters,   Patient status is: INPATIENT because of FOR SBO Level of care: Telemetry  Dispo: The patient is from: HOME            Anticipated disposition: Remains hospitalized for further postop course   Mobility Assessment (last 72 hours)     Mobility Assessment     Row Name 12/28/21 1916 12/27/21 2151 12/26/21 1910       Does patient have an order for bedrest or is patient medically unstable Yes- Bedfast (Level 1) - Complete Yes- Bedfast (Level 1) - Complete Yes- Bedfast (Level 1) - Complete     What is the highest level of mobility based on the progressive mobility assessment? Level 1 (Bedfast) - Unable to balance while sitting on edge of bed Level 1 (Bedfast) - Unable to balance while sitting on edge of bed Level 1 (Bedfast) - Unable to balance while sitting on edge of bed     Is the above level different from baseline mobility prior to current illness? No - Consider discontinuing PT/OT No - Consider discontinuing PT/OT No - Consider discontinuing PT/OT             Objective: Vitals last 24 hrs: Vitals:   12/28/21 1802 12/28/21 2008 12/29/21 0002 12/29/21 0411  BP: (!) 163/91 (!) 137/92 129/87 (!) 156/95  Pulse: (!) 116 (!) 124 (!) 125 (!) 125  Resp: 16 20 (!) 22 18   Temp: (!) 97.3 F (36.3 C) 98.4 F (36.9 C) 98.4 F (36.9 C) 98.4 F (36.9 C)  TempSrc: Oral Oral Oral Oral  SpO2: 96% 96% 98% 95%  Weight:      Height:       Weight change:   Physical Examination: General exam: AA0x3, ngt+, on RA, older than stated age, weak appearing. HEENT:Oral mucosa moist, Ear/Nose WNL grossly, dentition normal. Respiratory system: bilaterally diminished, no use of accessory muscle Cardiovascular system: S1 & S2 +, No JVD,. Gastrointestinal system: Abdomen soft, surgical site with honeycomb dressing clean dry intact, tenderness at the surgical site,BS+ Nervous System:Alert, awake, moving extremities and grossly nonfocal Extremities: LE ankle edema neg, distal peripheral pulses palpable.  Skin: No rashes,no icterus. MSK: Normal muscle bulk,tone, power   Medications reviewed:  Scheduled Meds:  Chlorhexidine Gluconate Cloth  6 each Topical Daily   fluticasone  1 spray Each Nare BID   insulin aspart  0-9 Units Subcutaneous Q6H   levothyroxine  100 mcg Intravenous Daily   pantoprazole (PROTONIX) IV  40 mg Intravenous Q12H   sodium chloride flush  10-40 mL Intracatheter Q12H  Continuous Infusions:  dextrose 5 % and 0.45% NaCl 10 mL/hr at 12/29/21 0641   heparin 1,450 Units/hr (12/29/21 0641)   potassium PHOSPHATE IVPB (in mmol)     TPN ADULT (ION) 80 mL/hr at 12/29/21 0641   TPN ADULT (ION)  Diet Order             Diet NPO time specified Except for: Ice Chips  Diet effective now                 Nutrition Problem: Inadequate oral intake Etiology: acute illness, nausea, vomiting Signs/Symptoms: NPO status Interventions: TPN   Intake/Output Summary (Last 24 hours) at 12/29/2021 1046 Last data filed at 12/29/2021 1000 Gross per 24 hour  Intake 2378.65 ml  Output 725 ml  Net 1653.65 ml   Net IO Since Admission: 3,298.98 mL [12/29/21 1046]  Wt Readings from Last 3 Encounters:  12/28/21 84.1 kg  09/11/21 77.1 kg  01/31/21 89.1 kg      Unresulted Labs (From admission, onward)     Start     Ordered   12/29/21 1300  Heparin level (unfractionated)  Once-Timed,   TIMED       Question:  Specimen collection method  Answer:  IV Team=IV Team collect   12/29/21 0447   12/29/21 0952  Iron and TIBC  Once,   R       Question:  Specimen collection method  Answer:  IV Team=IV Team collect   12/29/21 0951   12/29/21 0951  Ferritin  Once,   R       Question:  Specimen collection method  Answer:  IV Team=IV Team collect   12/29/21 0951   12/29/21 2440  Basic metabolic panel  Daily,   R     Question:  Specimen collection method  Answer:  IV Team=IV Team collect   12/28/21 0931   12/29/21 0500  Magnesium  Daily,   R     Question:  Specimen collection method  Answer:  IV Team=IV Team collect   12/28/21 0931   12/29/21 0500  Phosphorus  Daily,   R     Question:  Specimen collection method  Answer:  IV Team=IV Team collect   12/28/21 0931   12/27/21 0500  Comprehensive metabolic panel  (TPN Lab Panel)  Every Mon,Thu (0500),   R      12/26/21 1229   12/27/21 0500  Magnesium  (TPN Lab Panel)  Every Mon,Thu (0500),   R      12/26/21 1229   12/27/21 0500  Phosphorus  (TPN Lab Panel)  Every Mon,Thu (0500),   R      12/26/21 1229   12/27/21 0500  Triglycerides  (TPN Lab Panel)  Every Mon,Thu (0500),   R      12/26/21 1229   12/22/21 0500  CBC with Differential/Platelet  Daily,   R      12/21/21 1543          Data Reviewed: I have personally reviewed following labs and imaging studies CBC: Recent Labs  Lab 12/25/21 0439 12/26/21 0458 12/27/21 0433 12/28/21 0314 12/29/21 0308  WBC 6.7 6.6 8.4 9.0 14.2*  NEUTROABS 4.4 4.6 6.4 6.9 12.3*  HGB 10.1* 10.2* 9.8* 8.8* 9.7*  HCT 29.6* 30.0* 28.5* 25.5* 27.3*  MCV 77.1* 77.7* 77.0* 76.3* 74.0*  PLT 257 248 239 218 102   Basic Metabolic Panel: Recent Labs  Lab 12/24/21 0228 12/25/21 0439 12/26/21 0458 12/27/21 0433 12/28/21 0314 12/29/21 0308  NA 145 142 138 141 138 141   K 3.0* 3.3* 3.9 3.3* 3.4* 3.5  CL 111 108 106 108 104 107  CO2 '27 28 26 27 28 28  '$ GLUCOSE 130* 162* 112* 108* 112* 168*  BUN '19 16 13 9 '$ 10  12  CREATININE 0.74 0.77 0.63 0.66 0.53 0.43*  CALCIUM 8.7* 8.2* 7.9* 8.0* 7.7* 8.2*  MG 2.2  --   --  1.8 1.8 1.8  PHOS  --   --   --  2.2* 3.6 1.8*   GFR: Estimated Creatinine Clearance: 73.5 mL/min (A) (by C-G formula based on SCr of 0.43 mg/dL (L)). Liver Function Tests: Recent Labs  Lab 12/27/21 0433  AST 10*  ALT 8  ALKPHOS 74  BILITOT 0.3  PROT 6.9  ALBUMIN 2.1*   No results for input(s): "LIPASE", "AMYLASE" in the last 168 hours.  No results for input(s): "AMMONIA" in the last 168 hours. Coagulation Profile: No results for input(s): "INR", "PROTIME" in the last 168 hours.  BNP (last 3 results) No results for input(s): "PROBNP" in the last 8760 hours. HbA1C: Recent Labs    12/27/21 0433  HGBA1C 5.2   CBG: Recent Labs  Lab 12/27/21 2358 12/28/21 0538 12/28/21 1636 12/28/21 2358 12/29/21 0629  GLUCAP 136* 126* 179* 232* 168*   Lipid Profile: Recent Labs    12/27/21 0433  TRIG 76   Thyroid Function Tests: No results for input(s): "TSH", "T4TOTAL", "FREET4", "T3FREE", "THYROIDAB" in the last 72 hours. Sepsis Labs: No results for input(s): "PROCALCITON", "LATICACIDVEN" in the last 168 hours.   Recent Results (from the past 240 hour(s))  Resp Panel by RT-PCR (Flu A&B, Covid) Anterior Nasal Swab     Status: None   Collection Time: 12/20/21 12:40 PM   Specimen: Anterior Nasal Swab  Result Value Ref Range Status   SARS Coronavirus 2 by RT PCR NEGATIVE NEGATIVE Final    Comment: (NOTE) SARS-CoV-2 target nucleic acids are NOT DETECTED.  The SARS-CoV-2 RNA is generally detectable in upper respiratory specimens during the acute phase of infection. The lowest concentration of SARS-CoV-2 viral copies this assay can detect is 138 copies/mL. A negative result does not preclude SARS-Cov-2 infection and should not be  used as the sole basis for treatment or other patient management decisions. A negative result may occur with  improper specimen collection/handling, submission of specimen other than nasopharyngeal swab, presence of viral mutation(s) within the areas targeted by this assay, and inadequate number of viral copies(<138 copies/mL). A negative result must be combined with clinical observations, patient history, and epidemiological information. The expected result is Negative.  Fact Sheet for Patients:  EntrepreneurPulse.com.au  Fact Sheet for Healthcare Providers:  IncredibleEmployment.be  This test is no t yet approved or cleared by the [place] FDA and  has been authorized for detection and/or diagnosis of SARS-CoV-2 by FDA under an Emergency Use Authorization (EUA). This EUA will remain  in effect (meaning this test can be used) for the duration of the COVID-19 declaration under Section 564(b)(1) of the Act, 21 U.S.C.section 360bbb-3(b)(1), unless the authorization is terminated  or revoked sooner.       Influenza A by PCR NEGATIVE NEGATIVE Final   Influenza B by PCR NEGATIVE NEGATIVE Final    Comment: (NOTE) The Xpert Xpress SARS-CoV-2/FLU/RSV plus assay is intended as an aid in the diagnosis of influenza from Nasopharyngeal swab specimens and should not be used as a sole basis for treatment. Nasal washings and aspirates are unacceptable for Xpert Xpress SARS-CoV-2/FLU/RSV testing.  Fact Sheet for Patients: EntrepreneurPulse.com.au  Fact Sheet for Healthcare Providers: IncredibleEmployment.be  This test is not yet approved or cleared by the [place] FDA and has been authorized for detection and/or diagnosis of SARS-CoV-2 by FDA under an Emergency Use Authorization (  EUA). This EUA will remain in effect (meaning this test can be used) for the duration of the COVID-19 declaration under Section  564(b)(1) of the Act, 21 U.S.C. section 360bbb-3(b)(1), unless the authorization is terminated or revoked.  Performed at [facility], [place] [address], [place], [place] [address]   Culture, blood (Routine X 2) w Reflex to ID Panel     Status: None   Collection Time: 12/21/21  7:54 AM   Specimen: BLOOD  Result Value Ref Range Status   Specimen Description   Final    BLOOD LEFT ANTECUBITAL Performed at [facility] [address] [place], [place] [address]    Special Requests   Final    BOTTLES DRAWN AEROBIC AND ANAEROBIC Blood Culture adequate volume Performed at [facility] [address], [place], [place] [address]    Culture   Final    NO GROWTH 5 DAYS Performed at [facility], [place] [address], [place], [place] [address]    Report Status 12/26/2021 FINAL  Final  Culture, blood (Routine X 2) w Reflex to ID Panel     Status: None   Collection Time: 12/21/21  8:00 AM   Specimen: BLOOD RIGHT HAND  Result Value Ref Range Status   Specimen Description   Final    BLOOD RIGHT HAND Performed at [facility] [address], [place], [place] [address]    Special Requests   Final    BOTTLES DRAWN AEROBIC ONLY Blood Culture adequate volume Performed at [place] [address], [place], [place] [address]    Culture   Final    NO GROWTH 5 DAYS Performed at [facility], [place] [address] [place], [place] [address]    Report Status 12/26/2021 FINAL  Final    Antimicrobials: Anti-infectives (From admission, onward)    Start     Dose/Rate Route Frequency Ordered Stop   12/28/21 1030  ceFAZolin (ANCEF) IVPB 2g/100 mL premix        2 g 200 mL/hr over 30 Minutes Intravenous  Once 12/28/21 1021 12/28/21 1156   12/28/21 1019  ceFAZolin (ANCEF) 2-4 GM/100ML-% IVPB       Note to Pharmacy: [place]: cabinet override      12/28/21 1019 12/28/21 1154       Culture/Microbiology    Component Value Date/Time   SDES  12/21/2021 0800    BLOOD RIGHT HAND Performed at [facility], [place] [address], [place], [place] [address]    SPECREQUEST  12/21/2021 0800    BOTTLES DRAWN AEROBIC ONLY Blood Culture adequate volume Performed at [place] [address], [place], [place] [address]    CULT  12/21/2021 0800    NO GROWTH 5 DAYS Performed at [facility], [place] [address], [place], [place] [address]    REPTSTATUS 12/26/2021 FINAL 12/21/2021 0800    Other culture-see note  Radiology Studies: No results found.   LOS: 9 days   Antonieta Pert, MD [facility]  12/29/2021, 10:46 AM

## 2021-12-30 DIAGNOSIS — K56609 Unspecified intestinal obstruction, unspecified as to partial versus complete obstruction: Secondary | ICD-10-CM | POA: Diagnosis not present

## 2021-12-30 LAB — BASIC METABOLIC PANEL
Anion gap: 6 (ref 5–15)
BUN: 14 mg/dL (ref 8–23)
CO2: 25 mmol/L (ref 22–32)
Calcium: 7.8 mg/dL — ABNORMAL LOW (ref 8.9–10.3)
Chloride: 106 mmol/L (ref 98–111)
Creatinine, Ser: 0.4 mg/dL — ABNORMAL LOW (ref 0.44–1.00)
GFR, Estimated: >60 mL/min (ref >60)
Glucose, Bld: 120 mg/dL — ABNORMAL HIGH (ref 70–99)
Potassium: 4.1 mmol/L (ref 3.5–5.1)
Sodium: 137 mmol/L (ref 135–145)

## 2021-12-30 LAB — GLUCOSE, CAPILLARY
Glucose-Capillary: 136 mg/dL — ABNORMAL HIGH (ref 70–99)
Glucose-Capillary: 152 mg/dL — ABNORMAL HIGH (ref 70–99)
Glucose-Capillary: 135 mg/dL — ABNORMAL HIGH (ref 70–99)

## 2021-12-30 LAB — PHOSPHORUS: Phosphorus: 3.1 mg/dL (ref 2.5–4.6)

## 2021-12-30 LAB — CBC WITH DIFFERENTIAL/PLATELET
Abs Immature Granulocytes: 0.64 10*3/uL — ABNORMAL HIGH (ref 0.00–0.07)
Basophils Absolute: 0.1 10*3/uL (ref 0.0–0.1)
Basophils Relative: 1 %
Eosinophils Absolute: 0.3 10*3/uL (ref 0.0–0.5)
Eosinophils Relative: 2 %
HCT: 24.6 % — ABNORMAL LOW (ref 36.0–46.0)
Hemoglobin: 8.7 g/dL — ABNORMAL LOW (ref 12.0–15.0)
Immature Granulocytes: 4 %
Lymphocytes Relative: 12 %
Lymphs Abs: 1.9 10*3/uL (ref 0.7–4.0)
MCH: 26.6 pg (ref 26.0–34.0)
MCHC: 35.4 g/dL (ref 30.0–36.0)
MCV: 75.2 fL — ABNORMAL LOW (ref 80.0–100.0)
Monocytes Absolute: 1 10*3/uL (ref 0.1–1.0)
Monocytes Relative: 7 %
Neutro Abs: 11.7 10*3/uL — ABNORMAL HIGH (ref 1.7–7.7)
Neutrophils Relative %: 74 %
Platelets: 195 10*3/uL (ref 150–400)
RBC: 3.27 MIL/uL — ABNORMAL LOW (ref 3.87–5.11)
RDW: 16.9 % — ABNORMAL HIGH (ref 11.5–15.5)
WBC: 15.5 10*3/uL — ABNORMAL HIGH (ref 4.0–10.5)
nRBC: 0.2 % (ref 0.0–0.2)

## 2021-12-30 LAB — HEPARIN LEVEL (UNFRACTIONATED)
Heparin Unfractionated: 0.21 IU/mL — ABNORMAL LOW (ref 0.30–0.70)
Heparin Unfractionated: <0.1 IU/mL — ABNORMAL LOW (ref 0.30–0.70)

## 2021-12-30 LAB — MAGNESIUM: Magnesium: 1.8 mg/dL (ref 1.7–2.4)

## 2021-12-30 MED ORDER — HEPARIN (PORCINE) 25000 UT/250ML-% IV SOLN
1550.0000 [IU]/h | INTRAVENOUS | Status: DC
  Administered 2021-12-30 – 2021-12-31 (×2): 1550 [IU]/h via INTRAVENOUS
  Filled 2021-12-30 (×4): qty 250

## 2021-12-30 MED ORDER — METOPROLOL TARTRATE 5 MG/5ML IV SOLN
2.5000 mg | INTRAVENOUS | Status: DC | PRN
Start: 2021-12-30 — End: 2022-01-08
  Administered 2021-12-30: 2.5 mg via INTRAVENOUS
  Filled 2021-12-30: qty 5

## 2021-12-30 MED ORDER — TRAVASOL 10 % IV SOLN
INTRAVENOUS | Status: AC
  Filled 2021-12-30: qty 902.4

## 2021-12-30 NOTE — Progress Notes (Signed)
PROGRESS NOTE Katrina Donaldson  WNI:[id number] DOB: 08-22-1955 DOA: 12/20/2021 PCP: Donald Prose, MD   Brief Narrative/Hospital Course: 66 y.o.f w/ history significant of laparoscopic partial colectomy in 01/2021 2/2 cecal mass, thyroidectomy, brain aneurysm, chronic anticoagulation, hypertension, hypothyroidism, CVA, sacral osteomyelitis, wheelchair-bound presented from SNF complaining of abdominal pain, nausea, vomiting, poor appetite  x 5 days, last BM 5 days PTA. In the ED, vital signs noted for tachycardia otherwise fairly stable, labs noted for mild anemia, otherwise stable.  CT abdomen/pelvis showed high-grade SBO likely due to adhesions, large hiatal hernia.  Surgery consulted and patient admitted for further management  Patient has failed to progress despite having NG tube decompression IV fluid hydration. She was noted to have tachycardia abnormal EKG, no chest pain troponins are negative, EKG shows sinus tachycardia. Echocardiogram showed decreased EF 30-35%. Labs with improved/stable renal function, stable hemoglobin at 10.2 g.  Seen by cardiology.  Sbo did not improve subsequently underwent ex laparotomy with lysis of adhesion by Dr Barry Dienes 6/30    Subjective: Seen examined this am Reports she has smelled flatus No BM Overnight afebrile HR upto130s last night, getting prn Lopressor 2.5 mg Lbas w/ leukocytosis 14>15k NGT output- 500cc, it has put her back a little overnight patient refused advancement CCS- clamping tube today  x 8 hrs Assessment and Plan: Principal Problem:   SBO (small bowel obstruction) ([place]) Active Problems:   Essential hypertension   Anemia, iron deficiency   Hypothyroidism   Hypercoagulable state ([place])   Hypokalemia   Chronic anticoagulation   Cecum mass   Sacral osteomyelitis (HCC)   AKI (acute kidney injury) (HCC)   Pressure ulcer of sacral region, stage 3 (HCC)   Cardiomyopathy (HCC)   Acute combined systolic and diastolic heart failure (HCC)   SBO:  s/p  ex laparotomy with lysis of adhesion by Dr Barry Dienes 6/30, after it did not improve on conservative management.  Continue TPN n.p.o-- CCS- clamping tube today  x 8 hrs Cont pain control DVT prophylaxis post op as per surgical service.  Encourage PT OT and ambulation  Leukocytosis postop: Afebrile, likely reactive  post surgery.monitor Hypernatremia w/ Hyperchloremia: Resolved  Hypokalemia resolved   Cardiomyopathy w/ new acute systolic dysfunction with EF 30 to 35% Essential hypertension Sinus tachycardia: Patient's tachycardia, low EF-new systolic dysfunction likely in the setting of acute illness, appreciate cardiology input.remains tachycardic intermittently, continue prn Lopressor-will need repeat echo once acute illness resolves and outpatient CHF follow-up.  CHF is compensated.  Continue to monitor intake output, watch for fluid overload- noted some wt changes-if symptomatic add lasix, await Cardio recs today if anything needs to be adjusted for tachycardia .Net IO Since Admission: 6,183.04 mL [12/30/21 0922]  Filed Weights   12/23/21 0600 12/26/21 1230 12/28/21 0949  Weight: 80.4 kg 84.1 kg 84.1 kg    Normocytic anemia Iron deficiency Anemia of chronic disease:  Hemoglobin downtrending slightly, transfuse if less than 7 g.  Recent Labs  Lab 12/26/21 0458 12/27/21 0433 12/28/21 0314 12/29/21 0308 12/30/21 0303  HGB 10.2* 9.8* 8.8* 9.7* 8.7*  HCT 30.0* 28.5* 25.5* 27.3* 24.6*  GERD on IV PPI. HLD -hold statin. Fever-several days ago, no recurrence.UA unremarkable , blood cultures x2 NGTD,cxr-possible atelectasis.   Acute urine retention-resolved.  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  Xarelto on hold- On heparin drip for now per pharmacy  FGH:[id number]. Hypophosphatemia resolved  Goals of care full code, prognosis remains to  be seen given patient's acute multiple  medical problems including bowel obstruction and acute systolic dysfunction.  Stage III pressure ulcer on sacrum POA: Continue wound care and offloading Pressure Injury 12/20/21 Sacrum Posterior Stage 3 -  Full thickness tissue loss. Subcutaneous fat may be visible but bone, tendon or muscle are NOT exposed. Red (Active)  12/20/21 1950  Location:Sacrum  Location Orientation:Posterior  Staging:Stage 3-Full thickness tissue loss. Subcutaneous fat may be visible but bone, tendon or muscle are NOT exposed.  Wound Description (Comments):Red  Present on Admission:Yes  Dressing Type Foam:Lift dressing to assess site every shift. 12/26/21 1008  DVT prophylaxis: SCDs Start: 12/20/21 1811 Code Status:   Code Status: Full Code Family Communication: Discussed with patient's family previously including daughters,   Patient status is: INPATIENT because of SBO management. Level of care: Telemetry  Dispo: The patient is from: HOME            Anticipated disposition: Remains hospitalized for further postop course   Mobility Assessment (last 72 hours)     Mobility Assessment     Row Name 12/29/21 1200 12/28/21 1916 12/27/21 2151       Does patient have an order for bedrest or is patient medically unstable Yes- Bedfast (Level 1) - Complete Yes- Bedfast (Level 1) - Complete Yes- Bedfast (Level 1) - Complete     What is the highest level of mobility based on the progressive mobility assessment? Level 1 (Bedfast) - Unable to balance while sitting on edge of bed Level 1 (Bedfast) - Unable to balance while sitting on edge of bed Level 1 (Bedfast) - Unable to balance while sitting on edge of bed     Is the above level different from baseline mobility prior to current illness? Yes - Recommend PT order No - Consider discontinuing PT/OT No - Consider discontinuing PT/OT             Objective: Vitals last 24 hrs: Vitals:   12/29/21 0411 12/29/21 1300 12/29/21 2213 12/30/21 0505  BP: (!) 156/95 (!) 158/89  (!) 170/88 (!) 156/94  Pulse: (!) 125 94 (!) 132 (!) 108  Resp: '18 15 19 16  '$ Temp: 98.4 F (36.9 C) 98.7 F (37.1 C) 99.3 F (37.4 C) 99.1 F (37.3 C)  TempSrc: Oral Oral Oral Oral  SpO2: 95% 96% 94% 95%  Weight:      Height:       Weight change:   Physical Examination: General exam: AAox3, NGT+ older than stated age, weak appearing. HEENT:Oral mucosa moist, Ear/Nose WNL grossly, dentition normal. Respiratory system: bilaterally CLEAR no use of accessory muscle Cardiovascular system: S1 & S2 +, No JVD,. Gastrointestinal system: Abdomen soft, ND cd/I with dressing on surgical site, bs sluggish. Nervous System:Alert, awake, moving extremities and grossly nonfocal Extremities: LE ankle edema neg, distal peripheral pulses palpable.  Skin: No rashes,no icterus. MSK: Normal muscle bulk,tone, power   Medications reviewed:  Scheduled Meds:  Chlorhexidine Gluconate Cloth  6 each Topical Daily   fluticasone  1 spray Each Nare BID   insulin aspart  0-9 Units Subcutaneous Q6H   levothyroxine  100 mcg Intravenous Daily   pantoprazole (PROTONIX) IV  40 mg Intravenous Q12H   sodium chloride flush  10-40 mL Intracatheter Q12H  Continuous Infusions:  dextrose 5 % and 0.45% NaCl 10 mL/hr at 12/29/21 0641   heparin 1,550 Units/hr (12/30/21 0617)   TPN ADULT (ION) 80 mL/hr at 12/29/21 1734   Diet Order  Diet clear liquid Room service appropriate? Yes; Fluid consistency: Thin  Diet effective now                 Nutrition Problem: Inadequate oral intake Etiology: acute illness, nausea, vomiting Signs/Symptoms: NPO status Interventions: TPN   Intake/Output Summary (Last 24 hours) at 12/30/2021 0922 Last data filed at 12/30/2021 0645 Gross per 24 hour  Intake 3509.06 ml  Output 950 ml  Net 2559.06 ml   Net IO Since Admission: 6,183.04 mL [12/30/21 0922]  Wt Readings from Last 3 Encounters:  12/28/21 84.1 kg  09/11/21 77.1 kg  01/31/21 89.1 kg     Unresulted Labs  (From admission, onward)     Start     Ordered   12/30/21 1430  Heparin level (unfractionated)  Once-Timed,   TIMED       Question:  Specimen collection method  Answer:  IV Team=IV Team collect   12/30/21 0635   12/30/21 0500  Heparin level (unfractionated)  Daily,   R     Question:  Specimen collection method  Answer:  IV Team=IV Team collect   12/29/21 1355   12/27/21 0500  Comprehensive metabolic panel  (TPN Lab Panel)  Every Mon,Thu (0500),   R      12/26/21 1229   12/27/21 0500  Magnesium  (TPN Lab Panel)  Every Mon,Thu (0500),   R      12/26/21 1229   12/27/21 0500  Phosphorus  (TPN Lab Panel)  Every Mon,Thu (0500),   R      12/26/21 1229   12/27/21 0500  Triglycerides  (TPN Lab Panel)  Every Mon,Thu (0500),   R      12/26/21 1229   12/22/21 0500  CBC with Differential/Platelet  Daily,   R      12/21/21 1543          Data Reviewed: I have personally reviewed following labs and imaging studies CBC: Recent Labs  Lab 12/26/21 0458 12/27/21 0433 12/28/21 0314 12/29/21 0308 12/30/21 0303  WBC 6.6 8.4 9.0 14.2* 15.5*  NEUTROABS 4.6 6.4 6.9 12.3* 11.7*  HGB 10.2* 9.8* 8.8* 9.7* 8.7*  HCT 30.0* 28.5* 25.5* 27.3* 24.6*  MCV 77.7* 77.0* 76.3* 74.0* 75.2*  PLT 248 239 218 208 678   Basic Metabolic Panel: Recent Labs  Lab 12/24/21 0228 12/25/21 0439 12/26/21 0458 12/27/21 0433 12/28/21 0314 12/29/21 0308 12/30/21 0303  NA 145   < > 138 141 138 141 137  K 3.0*   < > 3.9 3.3* 3.4* 3.5 4.1  CL 111   < > 106 108 104 107 106  CO2 27   < > '26 27 28 28 25  '$ GLUCOSE 130*   < > 112* 108* 112* 168* 120*  BUN 19   < > '13 9 10 12 14  '$ CREATININE 0.74   < > 0.63 0.66 0.53 0.43* 0.40*  CALCIUM 8.7*   < > 7.9* 8.0* 7.7* 8.2* 7.8*  MG 2.2  --   --  1.8 1.8 1.8 1.8  PHOS  --   --   --  2.2* 3.6 1.8* 3.1   < > = values in this interval not displayed.   GFR: Estimated Creatinine Clearance: 73.5 mL/min (A) (by C-G formula based on SCr of 0.4 mg/dL (L)). Liver Function  Tests: Recent Labs  Lab 12/27/21 0433  AST 10*  ALT 8  ALKPHOS 74  BILITOT 0.3  PROT 6.9  ALBUMIN 2.1*   No results for input(s): "  LIPASE", "AMYLASE" in the last 168 hours.  No results for input(s): "AMMONIA" in the last 168 hours. Coagulation Profile: No results for input(s): "INR", "PROTIME" in the last 168 hours.  BNP (last 3 results) No results for input(s): "PROBNP" in the last 8760 hours. HbA1C: No results for input(s): "HGBA1C" in the last 72 hours.  CBG: Recent Labs  Lab 12/29/21 0629 12/29/21 1126 12/29/21 1639 12/29/21 2352 12/30/21 0503  GLUCAP 168* 159* 147* 149* 136*   Lipid Profile: No results for input(s): "CHOL", "HDL", "LDLCALC", "TRIG", "CHOLHDL", "LDLDIRECT" in the last 72 hours.  Thyroid Function Tests: No results for input(s): "TSH", "T4TOTAL", "FREET4", "T3FREE", "THYROIDAB" in the last 72 hours. Sepsis Labs: No results for input(s): "PROCALCITON", "LATICACIDVEN" in the last 168 hours.   Recent Results (from the past 240 hour(s))  Resp Panel by RT-PCR (Flu A&B, Covid) Anterior Nasal Swab     Status: None   Collection Time: 12/20/21 12:40 PM   Specimen: Anterior Nasal Swab  Result Value Ref Range Status   SARS Coronavirus 2 by RT PCR NEGATIVE NEGATIVE Final    Comment: (NOTE) SARS-CoV-2 target nucleic acids are NOT DETECTED.  The SARS-CoV-2 RNA is generally detectable in upper respiratory specimens during the acute phase of infection. The lowest concentration of SARS-CoV-2 viral copies this assay can detect is 138 copies/mL. A negative result does not preclude SARS-Cov-2 infection and should not be used as the sole basis for treatment or other patient management decisions. A negative result may occur with  improper specimen collection/handling, submission of specimen other than nasopharyngeal swab, presence of viral mutation(s) within the areas targeted by this assay, and inadequate number of viral copies(<138 copies/mL). A negative  result must be combined with clinical observations, patient history, and epidemiological information. The expected result is Negative.  Fact Sheet for Patients:  EntrepreneurPulse.com.au  Fact Sheet for Healthcare Providers:  IncredibleEmployment.be  This test is no t yet approved or cleared by the [place] FDA and  has been authorized for detection and/or diagnosis of SARS-CoV-2 by FDA under an Emergency Use Authorization (EUA). This EUA will remain  in effect (meaning this test can be used) for the duration of the COVID-19 declaration under Section 564(b)(1) of the Act, 21 U.S.C.section 360bbb-3(b)(1), unless the authorization is terminated  or revoked sooner.       Influenza A by PCR NEGATIVE NEGATIVE Final   Influenza B by PCR NEGATIVE NEGATIVE Final    Comment: (NOTE) The Xpert Xpress SARS-CoV-2/FLU/RSV plus assay is intended as an aid in the diagnosis of influenza from Nasopharyngeal swab specimens and should not be used as a sole basis for treatment. Nasal washings and aspirates are unacceptable for Xpert Xpress SARS-CoV-2/FLU/RSV testing.  Fact Sheet for Patients: EntrepreneurPulse.com.au  Fact Sheet for Healthcare Providers: IncredibleEmployment.be  This test is not yet approved or cleared by the [place] FDA and has been authorized for detection and/or diagnosis of SARS-CoV-2 by FDA under an Emergency Use Authorization (EUA). This EUA will remain in effect (meaning this test can be used) for the duration of the COVID-19 declaration under Section 564(b)(1) of the Act, 21 U.S.C. section 360bbb-3(b)(1), unless the authorization is terminated or revoked.  Performed at [facility], [place] [address] [place], [place] [address]   Culture, blood (Routine X 2) w Reflex to ID Panel     Status: None   Collection Time: 12/21/21  7:54 AM   Specimen: BLOOD  Result Value Ref  Range Status   Specimen Description  Final    BLOOD LEFT ANTECUBITAL Performed at [facility] [address], [place], [place] [address]    Special Requests   Final    BOTTLES DRAWN AEROBIC AND ANAEROBIC Blood Culture adequate volume Performed at [facility] [address], [place], [place] [address]    Culture   Final    NO GROWTH 5 DAYS Performed at [facility], [place] [address] [place], [place] [address]    Report Status 12/26/2021 FINAL  Final  Culture, blood (Routine X 2) w Reflex to ID Panel     Status: None   Collection Time: 12/21/21  8:00 AM   Specimen: BLOOD RIGHT HAND  Result Value Ref Range Status   Specimen Description   Final    BLOOD RIGHT HAND Performed at [place] [facility] [address] [place], [place] [address]    Special Requests   Final    BOTTLES DRAWN AEROBIC ONLY Blood Culture adequate volume Performed at [facility] [address] [place], [place] [address]    Culture   Final    NO GROWTH 5 DAYS Performed at [facility], [place] [address] [place], [place] [address]    Report Status 12/26/2021 FINAL  Final    Antimicrobials: Anti-infectives (From admission, onward)    Start     Dose/Rate Route Frequency Ordered Stop   12/28/21 1030  ceFAZolin (ANCEF) IVPB 2g/100 mL premix        2 g 200 mL/hr over 30 Minutes Intravenous  Once 12/28/21 1021 12/28/21 1156   12/28/21 1019  ceFAZolin (ANCEF) 2-4 GM/100ML-% IVPB       Note to Pharmacy: [place]: cabinet override      12/28/21 1019 12/28/21 1154      Culture/Microbiology    Component Value Date/Time   SDES  12/21/2021 0800    BLOOD RIGHT HAND Performed at [facility], [place] [address] [place], [place] [address]    SPECREQUEST  12/21/2021 0800    BOTTLES DRAWN AEROBIC ONLY Blood Culture adequate volume Performed at [facility] [address],  [place], [place] [address]    CULT  12/21/2021 0800    NO GROWTH 5 DAYS Performed at [facility], [place] [address], [place], [place] [address]    REPTSTATUS 12/26/2021 FINAL 12/21/2021 0800    Other culture-see note  Radiology Studies: No results found.   LOS: 10 days   Antonieta Pert, MD [facility]  12/30/2021, 9:22 AM

## 2021-12-30 NOTE — Progress Notes (Signed)
[place] for heparin Indication: history of DVT on rivaroxaban PTA, hypercoagulable state  Allergies  Allergen Reactions   Shellfish-Derived Products Other (See Comments)    ALLERGIC TO SHRIMP!! Made the tongue "feel strange" Per patient, she has since eaten crab legs and did not have another reaction.     Patient Measurements: Height: '5\' 6"'$  (167.6 cm) Weight: 84.1 kg (185 lb 6.5 oz) IBW/kg (Calculated) : 59.3 Heparin Dosing Weight: 76 kg  Vital Signs: Temp: 99.1 F (37.3 C) (07/02 0505) Temp Source: Oral (07/02 0505) BP: 156/94 (07/02 0505) Pulse Rate: 108 (07/02 0505)  Labs: Recent Labs    12/28/21 0314 12/29/21 0308 12/29/21 1247 12/29/21 2040 12/30/21 0303  HGB 8.8* 9.7*  --   --  8.7*  HCT 25.5* 27.3*  --   --  24.6*  PLT 218 208  --   --  195  HEPARINUNFRC  --  0.30 0.36 0.43 0.21*  CREATININE 0.53 0.43*  --   --  0.40*     Estimated Creatinine Clearance: 73.5 mL/min (A) (by C-G formula based on SCr of 0.4 mg/dL (L)).   Medications: Rivaroxaban 20 mg PO Daily PTA.  Last dose: 6/21 Infusions:   dextrose 5 % and 0.45% NaCl 10 mL/hr at 12/29/21 0641   heparin 1,450 Units/hr (12/30/21 0500)   TPN ADULT (ION) 80 mL/hr at 12/29/21 1734     Assessment: Pt is a 67 yoF admitted with SBO and initially treated with conservative management. PMH significant for DVT in 2018, hypercoagulable state on Xarelto PTA.   Xarelto held on admission, was on enoxaparin for DVT ppx from 6/22 > 6/27. Heparin drip was started on 6/27, but has been intermittently infusing as it was held for possible surgery on 6/28, 6/29, and 6/30 AM.   Patient underwent ex lap and lysis of adhesions 6/30. Heparin drip was held preop and resumed 8 hours postop.    12/30/2021  Heparin level 0.21 - below therapeutic range on heparin 1450 units/hr- has previously been therapeutic on this rate x 3  CBC: Hgb 8.7. stable, Plts WNL No issues with infusion or  bleeding reported  Goal of Therapy:  Heparin level 0.3-0.7 units/ml Monitor platelets by anticoagulation protocol: Yes   Plan:  increase heparin infusion to 1550 units/hr Check 8 hour heparin level after rate change Heparin level, CBC daily   [name], Pharm.D 12/30/2021 5:52 AM

## 2021-12-30 NOTE — Progress Notes (Signed)
Progress Note: General Surgery Service   Chief Complaint/Subjective: No major complaints this AM.  Thinks she is passing flatus.  Objective: Vital signs in last 24 hours: Temp:  [98.7 F (37.1 C)-99.3 F (37.4 C)] 99.1 F (37.3 C) (07/02 0505) Pulse Rate:  [94-132] 108 (07/02 0505) Resp:  [15-19] 16 (07/02 0505) BP: (156-170)/(88-94) 156/94 (07/02 0505) SpO2:  [94 %-96 %] 95 % (07/02 0505) Last BM Date : 01/15/22  Intake/Output from previous day: 07/01 0701 - 07/02 0700 In: 3509.1 [P.O.:30; I.V.:2344.1; IV Piggyback:510] Out: 950 [Urine:500; Emesis/NG output:450] Intake/Output this shift: No intake/output data recorded.  GI: Abd Soft, incision c/d/I w/ dressing  Lab Results: CBC  Recent Labs    12/29/21 0308 12/30/21 0303  WBC 14.2* 15.5*  HGB 9.7* 8.7*  HCT 27.3* 24.6*  PLT 208 195    BMET Recent Labs    12/29/21 0308 12/30/21 0303  NA 141 137  K 3.5 4.1  CL 107 106  CO2 28 25  GLUCOSE 168* 120*  BUN 12 14  CREATININE 0.43* 0.40*  CALCIUM 8.2* 7.8*    PT/INR No results for input(s): "LABPROT", "INR" in the last 72 hours. ABG No results for input(s): "PHART", "HCO3" in the last 72 hours.  Invalid input(s): "PCO2", "PO2"  Anti-infectives: Anti-infectives (From admission, onward)    Start     Dose/Rate Route Frequency Ordered Stop   12/28/21 1030  ceFAZolin (ANCEF) IVPB 2g/100 mL premix        2 g 200 mL/hr over 30 Minutes Intravenous  Once 12/28/21 1021 12/28/21 1156   12/28/21 1019  ceFAZolin (ANCEF) 2-4 GM/100ML-% IVPB       Note to Pharmacy: Christell Faith L: cabinet override      12/28/21 1019 12/28/21 1154       Medications: Scheduled Meds:  Chlorhexidine Gluconate Cloth  6 each Topical Daily   fluticasone  1 spray Each Nare BID   insulin aspart  0-9 Units Subcutaneous Q6H   levothyroxine  100 mcg Intravenous Daily   pantoprazole (PROTONIX) IV  40 mg Intravenous Q12H   sodium chloride flush  10-40 mL Intracatheter Q12H   Continuous  Infusions:  dextrose 5 % and 0.45% NaCl 10 mL/hr at 12/29/21 0641   heparin 1,550 Units/hr (12/30/21 0617)   TPN ADULT (ION) 80 mL/hr at 12/29/21 1734   PRN Meds:.acetaminophen **OR** acetaminophen, albuterol, azelastine, hydrALAZINE, hydrocortisone, lip balm, metoprolol tartrate, morphine injection, ondansetron **OR** ondansetron (ZOFRAN) IV, oxyCODONE, phenol, polyvinyl alcohol, sodium chloride, sodium chloride flush  Assessment/Plan: Katrina Donaldson is a 66 year old female who underwent exploratory laparotomy with lysis of adhesions that were causing an adhesive small bowel obstruction on 12/28/21.    During surgery, a small mesenteric nodule was sent for permanent section - path is pending. Clamp NG and try clear liquids Increase activity - unable to walk at baseline, will ask PT/OT to work with patient and aid in recovery Remainder of care per primary team Continue foley due to pressure ulcer and wetting bed   LOS: 10 days    Felicie Morn, MD  [facility] Use [email or web address] to contact on call provider  Daily Billing: [phone] - post op

## 2021-12-30 NOTE — Progress Notes (Signed)
Patient NGT has pulled back a little but she's refusing to allow advancement. Notified DR. Shalhoub and awaiting for a response.

## 2021-12-30 NOTE — Progress Notes (Signed)
[place] for heparin Indication: history of DVT on rivaroxaban PTA, hypercoagulable state  Allergies  Allergen Reactions   Shellfish-Derived Products Other (See Comments)    ALLERGIC TO SHRIMP!! Made the tongue "feel strange" Per patient, she has since eaten crab legs and did not have another reaction.     Patient Measurements: Height: '5\' 6"'$  (167.6 cm) Weight: 84.1 kg (185 lb 6.5 oz) IBW/kg (Calculated) : 59.3 Heparin Dosing Weight: 76 kg  Vital Signs: Temp: 99.1 F (37.3 C) (07/02 0505) Temp Source: Oral (07/02 0505) BP: 156/94 (07/02 0505) Pulse Rate: 108 (07/02 0505)  Labs: Recent Labs    12/28/21 0314 12/29/21 0308 12/29/21 1247 12/29/21 2040 12/30/21 0303  HGB 8.8* 9.7*  --   --  8.7*  HCT 25.5* 27.3*  --   --  24.6*  PLT 218 208  --   --  195  HEPARINUNFRC  --  0.30 0.36 0.43 0.21*  CREATININE 0.53 0.43*  --   --  0.40*     Estimated Creatinine Clearance: 73.5 mL/min (A) (by C-G formula based on SCr of 0.4 mg/dL (L)).   Medications: Rivaroxaban 20 mg PO Daily PTA.  Last dose: 6/21 Infusions:   dextrose 5 % and 0.45% NaCl 10 mL/hr at 12/29/21 0641   heparin 1,550 Units/hr (12/30/21 0617)   TPN ADULT (ION) 80 mL/hr at 12/29/21 1734   TPN ADULT (ION)       Assessment: Pt is a 75 yoF admitted with SBO and initially treated with conservative management. PMH significant for DVT in 2018, hypercoagulable state on Xarelto PTA.   Xarelto held on admission, was on enoxaparin for DVT ppx from 6/22 > 6/27. Heparin drip was started on 6/27, but has been intermittently infusing as it was held for possible surgery on 6/28, 6/29, and 6/30 AM.  Patient underwent ex lap and lysis of adhesions 6/30. Heparin drip was held preop and resumed 8 hours postop.    12/30/2021  Heparin level decreased to <0.1, subtherapeutic despite previous increase to heparin 1550 units/hr RN reports that at 7am this morning she noted the IV site was  infiltrated.  The Heparin infusion was moved to a new IV site in left upper arm and has been running for 8 hours in that new site.   RN confirms that the heparin appears to be infusing, but she cannot get blood return and due to arm size, she's having a hard time telling is the site is infiltrated.   At ~16:00 today she moved the heparin infusion to the patient's PICC line  CBC: Hgb down to 8.7, Plts WNL No issues with bleeding reported  Goal of Therapy:  Heparin level 0.3-0.7 units/ml Monitor platelets by anticoagulation protocol: Yes   Plan:  Continue the same heparin infusion 1550 units/hr Re-Check 8 hour heparin level after change in infusion site Heparin level, CBC daily  Follow up ability to tolerate enteral meds and change back to home Xarelto when able.     Gretta Arab PharmD, BCPS Clinical Pharmacist [facility] main pharmacy [phone] 12/30/2021 3:57 PM

## 2021-12-30 NOTE — Progress Notes (Signed)
Progress Note  Patient Name: Katrina Donaldson Date of Encounter: 12/30/2021  Primary Cardiologist: Skeet Latch, MD     Patient Profile     66 y.o. 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  Feels a little bit better than yesterday.  Still in bed.  Denies chest pain shortness of breath or palpitations  Inpatient Medications    Scheduled Meds:  Chlorhexidine Gluconate Cloth  6 each Topical Daily   fluticasone  1 spray Each Nare BID   insulin aspart  0-9 Units Subcutaneous Q6H   levothyroxine  100 mcg Intravenous Daily   pantoprazole (PROTONIX) IV  40 mg Intravenous Q12H   sodium chloride flush  10-40 mL Intracatheter Q12H   Continuous Infusions:  dextrose 5 % and 0.45% NaCl 10 mL/hr at 12/29/21 0641   heparin 1,550 Units/hr (12/30/21 0617)   TPN ADULT (ION) 80 mL/hr at 12/29/21 1734   PRN Meds: acetaminophen **OR** acetaminophen, albuterol, azelastine, hydrALAZINE, hydrocortisone, lip balm, metoprolol tartrate, morphine injection, ondansetron **OR** ondansetron (ZOFRAN) IV, oxyCODONE, phenol, polyvinyl alcohol, sodium chloride, sodium chloride flush   Vital Signs    Vitals:   12/29/21 0411 12/29/21 1300 12/29/21 2213 12/30/21 0505  BP: (!) 156/95 (!) 158/89 (!) 170/88 (!) 156/94  Pulse: (!) 125 94 (!) 132 (!) 108  Resp: '18 15 19 16  '$ Temp: 98.4 F (36.9 C) 98.7 F (37.1 C) 99.3 F (37.4 C) 99.1 F (37.3 C)  TempSrc: Oral Oral Oral Oral  SpO2: 95% 96% 94% 95%  Weight:      Height:        Intake/Output Summary (Last 24 hours) at 12/30/2021 1008 Last data filed at 12/30/2021 0645 Gross per 24 hour  Intake 3162.47 ml  Output 625 ml  Net 2537.47 ml    Filed Weights   12/23/21 0600 12/26/21 1230 12/28/21 0949  Weight: 80.4 kg 84.1 kg 84.1 kg     Telemetry    Sinus tach heart rates 90s-120s- Personally Reviewed  ECG    6/28 - Personally Reviewed P wave morphology consistent with sinus  Physical Exam    Well developed and nourished in no acute distress with an NG tube in place and TPN hanging HENT normal Neck supple  Clear laterally RAPID but regular rate and rhythm, no murmurs or gallops Abd-soft   No Clubbing cyanosis edema Skin-warm and dry A & Oriented  Grossly normal sensory and motor function     Labs    Chemistry Recent Labs  Lab 12/27/21 0433 12/28/21 0314 12/29/21 0308 12/30/21 0303  NA 141 138 141 137  K 3.3* 3.4* 3.5 4.1  CL 108 104 107 106  CO2 '27 28 28 25  '$ GLUCOSE 108* 112* 168* 120*  BUN '9 10 12 14  '$ CREATININE 0.66 0.53 0.43* 0.40*  CALCIUM 8.0* 7.7* 8.2* 7.8*  PROT 6.9  --   --   --   ALBUMIN 2.1*  --   --   --   AST 10*  --   --   --   ALT 8  --   --   --   ALKPHOS 74  --   --   --   BILITOT 0.3  --   --   --   GFRNONAA >60 >60 >  60 >60  ANIONGAP '6 6 6 6      '$ Hematology Recent Labs  Lab 12/28/21 0314 12/29/21 0308 12/30/21 0303  WBC 9.0 14.2* 15.5*  RBC 3.34* 3.69* 3.27*  HGB 8.8* 9.7* 8.7*  HCT 25.5* 27.3* 24.6*  MCV 76.3* 74.0* 75.2*  MCH 26.3 26.3 26.6  MCHC 34.5 35.5 35.4  RDW 17.3* 17.2* 16.9*  PLT 218 208 195       Radiology    No results found.  Cardiac Studies   (As above)  Assessment & Plan    Cardiomyopathy ? Cause EF 30-35% new from 2016  Sinus tach improving   SBO s/p lysis of adhesions  CVA   HTN  Anemia chronic micocytic Ferritin was 8   1 year ago; iron saturation 7/23 13%  Hemodynamics secondary to stress.  With iron deficiency anemia, had iron IV 8/22.  Ferritin is elevated but the context of her stress. Iron saturation is 13%    Continue IV metoprolol as needed for sustained heart rate over 120, will increase frequency from every 8--every 4  Guideline directed medical therapy appropriate following resumption of oral  medications          For questions or updates, please contact [facility] Please consult [email or web address] for contact info under Cardiology/STEMI.      Signed, Virl Axe, MD  12/30/2021, 10:08 AM

## 2021-12-30 NOTE — Progress Notes (Addendum)
PHARMACY - TOTAL PARENTERAL NUTRITION CONSULT NOTE   Indication: Small bowel obstruction  Patient Measurements: Height: '5\' 6"'$  (167.6 cm) Weight: 84.1 kg (185 lb 6.5 oz) IBW/kg (Calculated) : 59.3 TPN AdjBW (KG): 65.5 Body mass index is 29.93 kg/m.  Assessment:  66 yo F presents with abdominal pain, nausea, vomiting, and poor appetite x 5 days. 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 No BM for 5 days as well. NG output was 1.6 L and xray shows SBO. Going for ex lap, lysis of adhesions and possible bowel resection on 6/29. Does report shellfish allergy but not any fish. Reacted to shrimp but not crab and was not an anaphylactic reaction. Pharmacy consulted for TPN.  Glucose / Insulin: No hx of DM. CBGs ok. SSI used 5 units / 24 hrs. - 6/30 Dexamethasone '4mg'$   Electrolytes: WNL. Renal: SCr low, BUN wnl. Hepatic:  LFTs, Tbili, and Trig ok. Albumin low at 2.1. Intake / Output; MIVF: IVF at [facility]; UOP 52m, NG output 450 mL GI Surgeries / Procedures:  6/30 Exploratory laparotomy and lysis of adhesions.   Central access: PICC placed 6/29 TPN start date: 6/29  Nutritional Goals: Goal TPN rate is 80 mL/hr (provides 90 g of protein and 2,085 kcals per day)  RD Assessment: Estimated Needs Total Energy Estimated Needs: 1950-2150 Total Protein Estimated Needs: 85-95g Total Fluid Estimated Needs: 2L/day  Current Nutrition:  Clear liquids and TPN 7/2 Clamp NG and try clear liquids  Plan:  At 18:00: Continue TPN at goal rate 872mhr Electrolytes in TPN: Na 5034mL, K 72m83m, Ca 5mEq6m Mg 7mEq/74mand Phos 20mmol1mCl:Ac 1:1 Add standard MVI and trace elements to TPN Continue Sensitive q6h SSI and adjust as needed  Continue D51/2NS at [facility] labs on Mon/Thurs F/U nutrition plans after surgery.    ChristiGretta Arab, BCPS Clinical Pharmacist [facility] main pharmacy [phone] 8:50 AM

## 2021-12-31 ENCOUNTER — Inpatient Hospital Stay (HOSPITAL_COMMUNITY): Payer: Medicare Other

## 2021-12-31 ENCOUNTER — Other Ambulatory Visit: Payer: Self-pay

## 2021-12-31 DIAGNOSIS — K56609 Unspecified intestinal obstruction, unspecified as to partial versus complete obstruction: Secondary | ICD-10-CM | POA: Diagnosis not present

## 2021-12-31 LAB — COMPREHENSIVE METABOLIC PANEL
ALT: 12 U/L (ref 0–44)
AST: 18 U/L (ref 15–41)
Albumin: 2.1 g/dL — ABNORMAL LOW (ref 3.5–5.0)
Alkaline Phosphatase: 86 U/L (ref 38–126)
Anion gap: 7 (ref 5–15)
BUN: 13 mg/dL (ref 8–23)
CO2: 25 mmol/L (ref 22–32)
Calcium: 8 mg/dL — ABNORMAL LOW (ref 8.9–10.3)
Chloride: 104 mmol/L (ref 98–111)
Creatinine, Ser: 0.45 mg/dL (ref 0.44–1.00)
GFR, Estimated: >60 mL/min (ref >60)
Glucose, Bld: 114 mg/dL — ABNORMAL HIGH (ref 70–99)
Potassium: 4.5 mmol/L (ref 3.5–5.1)
Sodium: 136 mmol/L (ref 135–145)
Total Bilirubin: 0.4 mg/dL (ref 0.3–1.2)
Total Protein: 6.5 g/dL (ref 6.5–8.1)

## 2021-12-31 LAB — CBC WITH DIFFERENTIAL/PLATELET
Abs Immature Granulocytes: 0.7 10*3/uL — ABNORMAL HIGH (ref 0.00–0.07)
Basophils Absolute: 0.1 10*3/uL (ref 0.0–0.1)
Basophils Relative: 0 %
Eosinophils Absolute: 0.3 10*3/uL (ref 0.0–0.5)
Eosinophils Relative: 2 %
HCT: 24.2 % — ABNORMAL LOW (ref 36.0–46.0)
Hemoglobin: 8.5 g/dL — ABNORMAL LOW (ref 12.0–15.0)
Immature Granulocytes: 4 %
Lymphocytes Relative: 10 %
Lymphs Abs: 1.7 10*3/uL (ref 0.7–4.0)
MCH: 26.4 pg (ref 26.0–34.0)
MCHC: 35.1 g/dL (ref 30.0–36.0)
MCV: 75.2 fL — ABNORMAL LOW (ref 80.0–100.0)
Monocytes Absolute: 0.9 10*3/uL (ref 0.1–1.0)
Monocytes Relative: 5 %
Neutro Abs: 13.1 10*3/uL — ABNORMAL HIGH (ref 1.7–7.7)
Neutrophils Relative %: 79 %
Platelets: 227 10*3/uL (ref 150–400)
RBC: 3.22 MIL/uL — ABNORMAL LOW (ref 3.87–5.11)
RDW: 17.4 % — ABNORMAL HIGH (ref 11.5–15.5)
WBC: 16.8 10*3/uL — ABNORMAL HIGH (ref 4.0–10.5)
nRBC: 0.2 % (ref 0.0–0.2)

## 2021-12-31 LAB — TRIGLYCERIDES: Triglycerides: 57 mg/dL (ref <150)

## 2021-12-31 LAB — URINALYSIS, ROUTINE W REFLEX MICROSCOPIC
Bilirubin Urine: NEGATIVE
Glucose, UA: NEGATIVE mg/dL
Hgb urine dipstick: NEGATIVE
Ketones, ur: NEGATIVE mg/dL
Leukocytes,Ua: NEGATIVE
Nitrite: NEGATIVE
Protein, ur: NEGATIVE mg/dL
Specific Gravity, Urine: 1.011 (ref 1.005–1.030)
pH: 6 (ref 5.0–8.0)

## 2021-12-31 LAB — GLUCOSE, CAPILLARY
Glucose-Capillary: 122 mg/dL — ABNORMAL HIGH (ref 70–99)
Glucose-Capillary: 128 mg/dL — ABNORMAL HIGH (ref 70–99)
Glucose-Capillary: 131 mg/dL — ABNORMAL HIGH (ref 70–99)
Glucose-Capillary: 135 mg/dL — ABNORMAL HIGH (ref 70–99)
Glucose-Capillary: 143 mg/dL — ABNORMAL HIGH (ref 70–99)

## 2021-12-31 LAB — HEPARIN LEVEL (UNFRACTIONATED)
Heparin Unfractionated: 0.55 IU/mL (ref 0.30–0.70)
Heparin Unfractionated: 0.6 IU/mL (ref 0.30–0.70)

## 2021-12-31 LAB — MAGNESIUM: Magnesium: 1.9 mg/dL (ref 1.7–2.4)

## 2021-12-31 LAB — PHOSPHORUS: Phosphorus: 4 mg/dL (ref 2.5–4.6)

## 2021-12-31 MED ORDER — TRAVASOL 10 % IV SOLN
INTRAVENOUS | Status: AC
  Filled 2021-12-31: qty 902.4

## 2021-12-31 MED ORDER — METOPROLOL TARTRATE 5 MG/5ML IV SOLN
5.0000 mg | Freq: Four times a day (QID) | INTRAVENOUS | Status: DC
  Administered 2021-12-31 – 2022-01-08 (×24): 5 mg via INTRAVENOUS
  Filled 2021-12-31 (×23): qty 5

## 2021-12-31 NOTE — Progress Notes (Signed)
PHARMACY - TOTAL PARENTERAL NUTRITION CONSULT NOTE   Indication: Small bowel obstruction  Patient Measurements: Height: '5\' 6"'$  (167.6 cm) Weight: 84.1 kg (185 lb 6.5 oz) IBW/kg (Calculated) : 59.3 TPN AdjBW (KG): 65.5 Body mass index is 29.93 kg/m.  Assessment:  66 yo F presents with abdominal pain, nausea, vomiting, and poor appetite x 5 days. 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 No BM for 5 days as well. NG output was 1.6 L and xray shows SBO. Going for ex lap, lysis of adhesions and possible bowel resection on 6/29. Does report shellfish allergy but not any fish. Reacted to shrimp but not crab and was not an anaphylactic reaction. Pharmacy consulted for TPN.  Glucose / Insulin: No hx of DM. CBGs ok. SSI used 5 units / 24 hrs. - 6/30 Dexamethasone '4mg'$   Electrolytes: WNL. Renal: SCr low, BUN wnl. Hepatic:  LFTs and Trig ok. Albumin low at 2.1. Intake / Output; MIVF: IVF at [facility]; UOP 751m, NGT clamped 7/2 GI Surgeries / Procedures:  6/30 Exploratory laparotomy and lysis of adhesions.   Central access: PICC placed 6/29 TPN start date: 6/29  Nutritional Goals: Goal TPN rate is 80 mL/hr (provides 90 g of protein and 2,085 kcals per day)  RD Assessment: Estimated Needs Total Energy Estimated Needs: 1950-2150 Total Protein Estimated Needs: 85-95g Total Fluid Estimated Needs: 2L/day  Current Nutrition:  Clear liquids and TPN 7/2 Clamp NG and try clear liquids  Plan:  At 18:00: Continue TPN at goal rate 857mhr Electrolytes in TPN: Na 5062mL, K 63m22m, Ca 5mEq39m Mg 7mEq/61mand Phos 20mmol53mCl:Ac 1:1 Add standard MVI and trace elements to TPN Continue Sensitive q6h SSI and adjust as needed  Continue D51/2NS at [facility] TPN labs on Mon/Thurs F/U possible advancement of oral diet as tolerated  Randy AClayburn PertD, BCPS [phone]  7:33 AM

## 2021-12-31 NOTE — Progress Notes (Signed)
[place] for heparin Indication: history of DVT on rivaroxaban PTA, hypercoagulable state  Allergies  Allergen Reactions   Shellfish-Derived Products Other (See Comments)    ALLERGIC TO SHRIMP!! Made the tongue "feel strange" Per patient, she has since eaten crab legs and did not have another reaction.     Patient Measurements: Height: '5\' 6"'$  (167.6 cm) Weight: 84.1 kg (185 lb 6.5 oz) IBW/kg (Calculated) : 59.3 Heparin Dosing Weight: 76 kg  Vital Signs: Temp: 98.2 F (36.8 C) (07/02 2247) Temp Source: Oral (07/02 2247) BP: 144/92 (07/02 2247) Pulse Rate: 107 (07/02 2247)  Labs: Recent Labs    12/28/21 0314 12/29/21 0308 12/29/21 1247 12/30/21 0303 12/30/21 1445 12/30/21 2337  HGB 8.8* 9.7*  --  8.7*  --   --   HCT 25.5* 27.3*  --  24.6*  --   --   PLT 218 208  --  195  --   --   HEPARINUNFRC  --  0.30   < > 0.21* <0.10* 0.60  CREATININE 0.53 0.43*  --  0.40*  --   --    < > = values in this interval not displayed.     Estimated Creatinine Clearance: 73.5 mL/min (A) (by C-G formula based on SCr of 0.4 mg/dL (L)).   Medications: Rivaroxaban 20 mg PO Daily PTA.  Last dose: 6/21  Assessment: Pt is a 47 yoF admitted with SBO and initially treated with conservative management. PMH significant for DVT in 2018, hypercoagulable state on Xarelto PTA.   Xarelto held on admission, was on enoxaparin for DVT ppx from 6/22 > 6/27. Heparin drip was started on 6/27, but has been intermittently infusing as it was held for possible surgery on 6/28, 6/29, and 6/30 AM.  Patient underwent ex lap and lysis of adhesions 6/30. Heparin drip was held preop and resumed 8 hours postop.    12/31/2021  Heparin level 0.6 - therapeutic on heparin drip at 1550 units/hr infusing via the patient's PICC line  No issues with bleeding reported  Goal of Therapy:  Heparin level 0.3-0.7 units/ml Monitor platelets by anticoagulation protocol: Yes   Plan:   Continue heparin infusion 1550 units/hr via PICC line confirmatory 8 hour heparin level  Heparin level, CBC daily  Follow up ability to tolerate enteral meds and change back to home Xarelto when able.    Katrina Donaldson, Pharm.D 12/31/2021 1:37 AM

## 2021-12-31 NOTE — Progress Notes (Addendum)
Progress Note: General Surgery Service   Chief Complaint/Subjective: No major complaints this AM.  Thinks she is passing flatus. Denies BM. Tolerated NG clamped x 24h without nausea or vomiting. Did not drink much clears because she states she thought she was thirsty but then just didn't want it. Denies SOB and says she has been using IS "to the middle". States she has been bedbound x 4 years.   Objective: Vital signs in last 24 hours: Temp:  [97.3 F (36.3 C)-98.4 F (36.9 C)] 98.4 F (36.9 C) (07/03 0551) Pulse Rate:  [103-125] 109 (07/03 0551) Resp:  [16-20] 17 (07/03 0551) BP: (144-157)/(82-94) 152/82 (07/03 0551) SpO2:  [95 %-100 %] 100 % (07/03 0551) Last BM Date : 12/30/21  Intake/Output from previous day: 07/02 0701 - 07/03 0700 In: 2530.6 [P.O.:420; I.V.:2110.6] Out: 1525 [Urine:1525] Intake/Output this shift: Total I/O In: 77.7 [I.V.:77.7] Out: -   GI: Abd Soft, incision c/d/I w/ dressing; NG clamped  Lab Results: CBC  Recent Labs    12/30/21 0303 12/31/21 0328  WBC 15.5* 16.8*  HGB 8.7* 8.5*  HCT 24.6* 24.2*  PLT 195 227   BMET Recent Labs    12/30/21 0303 12/31/21 0328  NA 137 136  K 4.1 4.5  CL 106 104  CO2 25 25  GLUCOSE 120* 114*  BUN 14 13  CREATININE 0.40* 0.45  CALCIUM 7.8* 8.0*   PT/INR No results for input(s): "LABPROT", "INR" in the last 72 hours. ABG No results for input(s): "PHART", "HCO3" in the last 72 hours.  Invalid input(s): "PCO2", "PO2"  Anti-infectives: Anti-infectives (From admission, onward)    Start     Dose/Rate Route Frequency Ordered Stop   12/28/21 1030  ceFAZolin (ANCEF) IVPB 2g/100 mL premix        2 g 200 mL/hr over 30 Minutes Intravenous  Once 12/28/21 1021 12/28/21 1156   12/28/21 1019  ceFAZolin (ANCEF) 2-4 GM/100ML-% IVPB       Note to Pharmacy: Christell Faith L: cabinet override      12/28/21 1019 12/28/21 1154       Medications: Scheduled Meds:  Chlorhexidine Gluconate Cloth  6 each Topical Daily    fluticasone  1 spray Each Nare BID   insulin aspart  0-9 Units Subcutaneous Q6H   levothyroxine  100 mcg Intravenous Daily   pantoprazole (PROTONIX) IV  40 mg Intravenous Q12H   sodium chloride flush  10-40 mL Intracatheter Q12H   Continuous Infusions:  dextrose 5 % and 0.45% NaCl 10 mL/hr at 12/29/21 0641   heparin 1,550 Units/hr (12/31/21 0702)   TPN ADULT (ION) 80 mL/hr at 12/30/21 1745   TPN ADULT (ION)     PRN Meds:.acetaminophen **OR** acetaminophen, albuterol, azelastine, hydrALAZINE, hydrocortisone, lip balm, metoprolol tartrate, morphine injection, ondansetron **OR** ondansetron (ZOFRAN) IV, oxyCODONE, phenol, polyvinyl alcohol, sodium chloride, sodium chloride flush  Assessment/Plan: Ms. Kuznicki is a 66 year old female who underwent exploratory laparotomy with lysis of adhesions that were causing an adhesive small bowel obstruction on 12/28/21.    During surgery, a small mesenteric nodule was sent for permanent section - path is pending. Afebrile, HR 109, WBC 14 > 15 > 16 (check CXR, she still has a foley, low suspicion for intra-abd infection at this point) - difficult to gather if patient is truly having any signs of bowel function but her abdomen is soft and non-distended. No N/V. She has a hiatal hernia with some gastric dilatation on her CXR - id like to hook her NG  tube to LIWS to check residuals before we consider removal.sips of clears/with meds ok. PT/OT as able  Remainder of care per primary team Foley has been in place for pressure ulcer/bed wetting. Good UOP. Ok from [place] to D/C foley and place external catheter.     LOS: 11 days    Jill Alexanders, [facility] Use [email or web address] to contact on call provider  Daily Billing: [phone] - post op

## 2021-12-31 NOTE — Care Management Important Message (Signed)
Important Message  Patient Details IM Letter placed in Patients room. Name: Katrina Donaldson MRN: [id number] Date of Birth: [birth year]   Medicare Important Message Given:  Yes     Kerin Salen 12/31/2021, 4:07 PM

## 2021-12-31 NOTE — Progress Notes (Addendum)
Progress Note  Patient Name: Katrina Donaldson Date of Encounter: 12/31/2021  Primary Cardiologist: Skeet Latch, MD     Patient Profile     66 y.o. 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 Subjective   Feels a little better than yesterday, not aware of elevated HR  Inpatient Medications    Scheduled Meds:  Chlorhexidine Gluconate Cloth  6 each Topical Daily   fluticasone  1 spray Each Nare BID   insulin aspart  0-9 Units Subcutaneous Q6H   levothyroxine  100 mcg Intravenous Daily   pantoprazole (PROTONIX) IV  40 mg Intravenous Q12H   sodium chloride flush  10-40 mL Intracatheter Q12H   Continuous Infusions:  dextrose 5 % and 0.45% NaCl 10 mL/hr at 12/29/21 0641   heparin 1,550 Units/hr (12/31/21 0702)   TPN ADULT (ION) 80 mL/hr at 12/30/21 1745   TPN ADULT (ION)     PRN Meds: acetaminophen **OR** acetaminophen, albuterol, azelastine, hydrALAZINE, hydrocortisone, lip balm, metoprolol tartrate, morphine injection, ondansetron **OR** ondansetron (ZOFRAN) IV, oxyCODONE, phenol, polyvinyl alcohol, sodium chloride, sodium chloride flush   Vital Signs    Vitals:   12/30/21 1332 12/30/21 2218 12/30/21 2247 12/31/21 0551  BP: (!) 148/93 (!) 157/94 (!) 144/92 (!) 152/82  Pulse: (!) 103 (!) 125 (!) 107 (!) 109  Resp: '18 16 20 17  '$ Temp: (!) 97.3 F (36.3 C) 98.4 F (36.9 C) 98.2 F (36.8 C) 98.4 F (36.9 C)  TempSrc: Oral Oral Oral Oral  SpO2:  95% 96% 100%  Weight:      Height:        Intake/Output Summary (Last 24 hours) at 12/31/2021 0741 Last data filed at 12/31/2021 0272 Gross per 24 hour  Intake 2608.31 ml  Output 1525 ml  Net 1083.31 ml   Filed Weights   12/23/21 0600 12/26/21 1230 12/28/21 0949  Weight: 80.4 kg 84.1 kg 84.1 kg    Telemetry    ST, HR generally >  100 - Personally Reviewed  ECG    None today- Personally Reviewed  Physical Exam    General: Chronically ill appearing female in no acute distress Head: Eyes PERRLA, Head normocephalic and atraumatic, NG tube in place Lungs: clear anteriorly to auscultation. Heart: HRRR S1 S2, without rub or gallop. No murmur. Upper extremity pulses are 2+ & equal. No JVD. Abdomen: Bowel sounds are absent, abdomen soft and tender without masses or  hernias noted. Msk: Not able to test strength and tone. Extremities: No clubbing, cyanosis or edema.    Skin:  No rashes or lesions noted. Neuro: Alert and oriented X 2. Psych:  Good affect, responds appropriately   Labs    Chemistry Recent Labs  Lab 12/27/21 0433 12/28/21 0314 12/29/21 0308 12/30/21 0303 12/31/21 0328  NA 141   < > 141 137 136  K 3.3*   < > 3.5 4.1 4.5  CL 108   < > 107 106 104  CO2 27   < > '28 25 25  '$ GLUCOSE 108*   < > 168* 120* 114*  BUN 9   < > '12 14 13  '$ CREATININE 0.66   < > 0.43* 0.40* 0.45  CALCIUM 8.0*   < > 8.2* 7.8* 8.0*  PROT 6.9  --   --   --  6.5  ALBUMIN 2.1*  --   --   --  2.1*  AST 10*  --   --   --  18  ALT 8  --   --   --  12  ALKPHOS 74  --   --   --  86  BILITOT 0.3  --   --   --  0.4  GFRNONAA >60   < > >60 >60 >60  ANIONGAP 6   < > '6 6 7   '$ < > = values in this interval not displayed.     Hematology Recent Labs  Lab 12/29/21 0308 12/30/21 0303 12/31/21 0328  WBC 14.2* 15.5* 16.8*  RBC 3.69* 3.27* 3.22*  HGB 9.7* 8.7* 8.5*  HCT 27.3* 24.6* 24.2*  MCV 74.0* 75.2* 75.2*  MCH 26.3 26.6 26.4  MCHC 35.5 35.4 35.1  RDW 17.2* 16.9* 17.4*  PLT 208 195 227     Lab Results  Component Value Date   IRON 20 (L) 12/29/2021   TIBC 148 (L) 12/29/2021   FERRITIN 514 (H) 12/29/2021   No results found for: "TSH"   Radiology    No results found.  Cardiac Studies   ECHO: 12/26/2021  1. Left ventricular ejection fraction, by estimation, is 30 to 35%. The  left ventricle has moderately  decreased function. The left ventricle  demonstrates global hypokinesis. Left ventricular diastolic parameters are  consistent with Grade I diastolic dysfunction (impaired relaxation).   2. Right ventricular systolic function is normal. The right ventricular  size is normal.   3. The mitral valve is normal in structure. No evidence of mitral valve  regurgitation. No evidence of mitral stenosis.   4. The aortic valve is calcified. There is mild calcification of the  aortic valve. There is mild thickening of the aortic valve. Aortic valve  regurgitation is not visualized. Aortic valve sclerosis is present, with  no evidence of aortic valve stenosis.   5. The inferior vena cava is normal in size with greater than 50%  respiratory variability, suggesting right atrial pressure of 3 mmHg.   [place] LOWER EXTREMITY - DVT STUDY 12/26/2021 Comparison Study: 07/25/2016 - - Findings consistent with acute deep vein  thrombosis involving the right common femoral vein, right profunda femoris vein, right femoral vein, right popliteal vein, right posterial  tibial vein, and right peroneal vein.                    - Findings consistent with acute superficial vein thrombosis                    involving the right small saphenous vein.                    - No evidence of deep vein thrombosis involving the left lower                    extremity.                    - No evidence of Baker&'s cyst on the right or left.   Assessment & Plan    Cardiomyopathy ? Cause EF 30-35% new from 2016 - Guideline directed medical therapy appropriate following resumption of oral medications - for now, will schedule IV metoprolol 5 mg q 8 hr, can also have prn if needed - Cr is low, MD advise on starting IV ACE inhibitor such as enalapril 1.25 mg q 6  hr, change to ARB when taking po's  Sinus tach - Continue IV metoprolol 2.5 mg q 4 hr as needed for sustained heart rate over 120 - got 2 doses yesterday - HR still running  generally > 100 but slightly improved -  elevated HR is felt 2nd acute illness - on home dose IV equivalent of synthroid w/ no recent TSH, will order  SBO s/p lysis of adhesions - per CCS, IM  CVA, 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 -  Ferritin is elevated but in the context of her stress. -  Iron saturation is 13%   - mgt per IM     For questions or updates, please contact [facility] Please consult [email or web address] for contact info under Cardiology/STEMI.      Signed, Rosaria Ferries, PA-C  12/31/2021, 7:41 AM    I have seen and examined the patient along with Rosaria Ferries, PA-C .  I have reviewed the chart, notes and new data.  I agree with PA/NP's note.  Key new complaints: Feels well, denies dyspnea at unaware of tachycardia. Key examination changes: No overt signs of hypervolemia. Key new findings / data: Sinus tachycardia on telemetry.  Iron deficiency anemia.  Reviewed.  LVEF is moderately decreased but there are no signs of elevated right or left heart filling pressures.  PLAN: Agree with beta-blockers, but other heart failure medications should wait until she is able to take p.o. medicines.  Sanda Klein, MD, [place] [phone] 12/31/2021, 4:24 PM

## 2021-12-31 NOTE — Progress Notes (Addendum)
[place] for heparin Indication: history of DVT on rivaroxaban PTA, hypercoagulable state  Allergies  Allergen Reactions   Shellfish-Derived Products Other (See Comments)    ALLERGIC TO SHRIMP!! Made the tongue "feel strange" Per patient, she has since eaten crab legs and did not have another reaction.     Patient Measurements: Height: '5\' 6"'$  (167.6 cm) Weight: 84.1 kg (185 lb 6.5 oz) IBW/kg (Calculated) : 59.3 Heparin Dosing Weight: 76 kg  Vital Signs: Temp: 98.4 F (36.9 C) (07/03 0551) Temp Source: Oral (07/03 0551) BP: 152/82 (07/03 0551) Pulse Rate: 109 (07/03 0551)  Labs: Recent Labs    12/29/21 0308 12/29/21 1247 12/30/21 0303 12/30/21 1445 12/30/21 2337 12/31/21 0328 12/31/21 0806  HGB 9.7*  --  8.7*  --   --  8.5*  --   HCT 27.3*  --  24.6*  --   --  24.2*  --   PLT 208  --  195  --   --  227  --   HEPARINUNFRC 0.30   < > 0.21* <0.10* 0.60  --  0.55  CREATININE 0.43*  --  0.40*  --   --  0.45  --    < > = values in this interval not displayed.     Estimated Creatinine Clearance: 73.5 mL/min (by C-G formula based on SCr of 0.45 mg/dL).   Medications: Rivaroxaban 20 mg PO Daily PTA.  Last dose: 6/21  Assessment: Pt is a 59 yoF admitted with SBO and initially treated with conservative management. PMH significant for DVT in 2018, hypercoagulable state on Xarelto PTA.   Xarelto held on admission, was on enoxaparin for DVT ppx from 6/22 > 6/27. Heparin drip was started on 6/27, but has been intermittently infusing as it was held for possible surgery on 6/28, 6/29, and 6/30 AM.  Patient underwent ex lap and lysis of adhesions 6/30. Heparin drip was held preop and resumed 8 hours postop.    12/31/2021  Heparin level 0.55 - therapeutic on heparin drip at 1550 units/hr, was switched to infusion via the PICC line on 7/2 No issues with bleeding reported  Goal of Therapy:  Heparin level 0.3-0.7 units/ml Monitor platelets by  anticoagulation protocol: Yes   Plan:  Continue heparin infusion 1550 units/hr via PICC line Heparin level, CBC daily  Follow up ability to tolerate enteral meds and change back to home Xarelto when able.    Clayburn Pert, PharmD, [place] [phone] 12/31/2021  8:52 AM

## 2021-12-31 NOTE — Progress Notes (Addendum)
PROGRESS NOTE Katrina Donaldson  WUJ:[id number] DOB: 28-Jun-1956 DOA: 12/20/2021 PCP: Donald Prose, MD   Brief Narrative/Hospital Course: 66 y.o.f w/ history significant of laparoscopic partial colectomy in 01/2021 2/2 cecal mass, thyroidectomy, brain aneurysm, chronic anticoagulation, hypertension, hypothyroidism, CVA, sacral osteomyelitis, wheelchair-bound presented from SNF complaining of abdominal pain, nausea, vomiting, poor appetite  x 5 days, last BM 5 days PTA. In the ED, vital signs noted for tachycardia otherwise fairly stable, labs noted for mild anemia, otherwise stable.  CT abdomen/pelvis showed high-grade SBO likely due to adhesions, large hiatal hernia.  Surgery consulted and patient admitted for further management  Patient has failed to progress despite having NG tube decompression IV fluid hydration. She was noted to have tachycardia abnormal EKG, no chest pain troponins are negative, EKG shows sinus tachycardia. Echocardiogram showed decreased EF 30-35%. Labs with improved/stable renal function, stable hemoglobin at 10.2 g.  Seen by cardiology.  Sbo did not improve subsequently underwent ex laparotomy with lysis of adhesion by Dr Barry Dienes 6/30    Subjective: Seen and examined this morning.  Patient reports she is doing okay. Assessment splatters no BM yet. Overnight remains afebrile mildly tachycardic, on room air Labs shows WBC count further uptrending, stable CMP with low albumin 2.1 NGT clamped 7/2-tolerated x24 hours without nausea and vomiting Denies cough or urinary symptoms  Assessment and Plan: Principal Problem:   SBO (small bowel obstruction) (HCC) Active Problems:   Essential hypertension   Anemia, iron deficiency   Hypothyroidism   Hypercoagulable state ([place])   Hypokalemia   Chronic anticoagulation   Cecum mass   Sacral osteomyelitis (HCC)   AKI (acute kidney injury) ([place])   Pressure ulcer of sacral region, stage 3 ([place])   Cardiomyopathy ([place])   SBO:s/p ex  laparotomy with lysis of adhesion by Dr Barry Dienes 6/30, after it did not improve on conservative management.  While n.p.o. continue TPN, surgery managing, NGT clamped 7/2-tolerated clamping x24 hours without nausea or vomiting.  Surgery hooking her NG tube to LIWS to check residuals before considering removal, sips clears with meds okay. Continue pain control DVT prophylaxis and ambulation PT OT.  Leukocytosis postop: WBC count trending up but afebrile, question in the setting of 1, watch for any signs of infection. Checked UA-and unremarkable, encourage IS. Hypernatremia w/ Hyperchloremia: Resolved  Hypokalemia resolved   Cardiomyopathy w/ new acute systolic dysfunction with EF 30 to 35% Essential hypertension Sinus tachycardia: Patient's tachycardia,new systolic dysfunction likely in the setting of acute illness, appreciate cardiology input.remains tachycardic, on Lopressor q4hr prn for hr>120 per cardio discussion. She will need repeat echo once acute illness resolves and outpatient CHF follow-up.  CHF is compensated.  Continue to monitor intake output, watch for fluid overload- noted some wt changes-if symptomatic add lasix, await Cardio recs today if anything needs to be adjusted for tachycardia .Net IO Since Admission: 6,946.35 mL [12/31/21 1127]  Filed Weights   12/23/21 0600 12/26/21 1230 12/28/21 0949  Weight: 80.4 kg 84.1 kg 84.1 kg    Normocytic anemia Iron deficiency Anemia of chronic disease:  Hb  downtrending slowly, transfuse if less than 7 g.  Recent Labs  Lab 12/27/21 0433 12/28/21 0314 12/29/21 0308 12/30/21 0303 12/31/21 0328  HGB 9.8* 8.8* 9.7* 8.7* 8.5*  HCT 28.5* 25.5* 27.3* 24.6* 24.2*  GERD cont IV PPI. HLD -cont to holding statin. Fever-several days ago, no recurrence.UA unremarkable , blood cultures x2 NGTD,cxr-possible atelectasis.   Acute urine retention-resolved.  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  Xarelto on hold- On heparin drip for now per pharmacy  ZJI:[id number]. Hypophosphatemia resolved.monitor.  Goals of care full code, prognosis remains to be seen given patient's acute multiple medical problems including bowel obstruction and acute systolic dysfunction.  Stage III pressure ulcer on sacrum POA: Continue wound care and offloading Pressure Injury 12/20/21 Sacrum Posterior Stage 3 -  Full thickness tissue loss. Subcutaneous fat may be visible but bone, tendon or muscle are NOT exposed. Red (Active)  12/20/21 1950  Location:Sacrum  Location Orientation:Posterior  Staging:Stage 3-Full thickness tissue loss. Subcutaneous fat may be visible but bone, tendon or muscle are NOT exposed.  Wound Description (Comments):Red  Present on Admission:Yes  Dressing Type Foam:Lift dressing to assess site every shift. 12/26/21 1008  DVT prophylaxis: SCDs Start: 12/20/21 1811 Code Status:   Code Status: Full Code Family Communication: Discussed with patient's family previously including daughters,   Patient status is: INPATIENT because of SBO management. Level of care: Telemetry  Dispo: The patient is from: HOME            Anticipated disposition: Remains hospitalized for further postop course   Mobility Assessment (last 72 hours)     Mobility Assessment     Row Name 12/31/21 0945 12/30/21 2128 12/30/21 13:32:35 12/29/21 1200 12/28/21 1916   Does patient have an order for bedrest or is patient medically unstable No - Continue assessment No - Continue assessment No - Continue assessment Yes- Bedfast (Level 1) - Complete Yes- Bedfast (Level 1) - Complete   What is the highest level of mobility based on the progressive mobility assessment? Level 2 (Chairfast) - Balance while sitting on edge of bed and cannot stand Level 1 (Bedfast) - Unable to balance while sitting on edge of bed Level 1 (Bedfast) - Unable to balance while sitting on edge  of bed Level 1 (Bedfast) - Unable to balance while sitting on edge of bed Level 1 (Bedfast) - Unable to balance while sitting on edge of bed   Is the above level different from baseline mobility prior to current illness? -- -- --  pt refuses PT/OT Yes - Recommend PT order No - Consider discontinuing PT/OT           Objective: Vitals last 24 hrs: Vitals:   12/30/21 1332 12/30/21 2218 12/30/21 2247 12/31/21 0551  BP: (!) 148/93 (!) 157/94 (!) 144/92 (!) 152/82  Pulse: (!) 103 (!) 125 (!) 107 (!) 109  Resp: '18 16 20 17  '$ Temp: (!) 97.3 F (36.3 C) 98.4 F (36.9 C) 98.2 F (36.8 C) 98.4 F (36.9 C)  TempSrc: Oral Oral Oral Oral  SpO2:  95% 96% 100%  Weight:      Height:       Weight change:   Physical Examination: General exam: [id number]+, on RA,, older than stated age, weak appearing. HEENT:Oral mucosa moist, Ear/Nose WNL grossly, dentition normal. Respiratory system: bilaterally diminished, no use of accessory muscle Cardiovascular system: S1 & S2 +, No JVD,. Gastrointestinal system: Abdomen soft, tender around surgical site with dressing in place clean dry, bowel sounds sluggish  Nervous System:Alert, awake, moving extremities and grossly nonfocal Extremities: LE ankle edema neg, distal peripheral pulses palpable.  Skin: No rashes,no icterus. MSK: Normal muscle bulk,tone, power   Medications reviewed:  Scheduled Meds:  Chlorhexidine Gluconate Cloth  6 each Topical Daily   fluticasone  1 spray Each Nare BID   insulin aspart  0-9 Units Subcutaneous Q6H  levothyroxine  100 mcg Intravenous Daily   pantoprazole (PROTONIX) IV  40 mg Intravenous Q12H   sodium chloride flush  10-40 mL Intracatheter Q12H  Continuous Infusions:  dextrose 5 % and 0.45% NaCl 10 mL/hr at 12/29/21 0641   heparin 1,550 Units/hr (12/31/21 0702)   TPN ADULT (ION) 80 mL/hr at 12/30/21 1745   TPN ADULT (ION)     Diet Order             Diet clear liquid Room service appropriate? Yes; Fluid  consistency: Thin  Diet effective now                 Nutrition Problem: Inadequate oral intake Etiology: acute illness, nausea, vomiting Signs/Symptoms: NPO status Interventions: TPN   Intake/Output Summary (Last 24 hours) at 12/31/2021 1127 Last data filed at 12/31/2021 1000 Gross per 24 hour  Intake 2111.69 ml  Output 1875 ml  Net 236.69 ml   Net IO Since Admission: 6,946.35 mL [12/31/21 1127]  Wt Readings from Last 3 Encounters:  12/28/21 84.1 kg  09/11/21 77.1 kg  01/31/21 89.1 kg     Unresulted Labs (From admission, onward)     Start     Ordered   01/01/22 0500  Heparin level (unfractionated)  Daily,   R     Question:  Specimen collection method  Answer:  IV Team=IV Team collect   12/31/21 0852   12/27/21 0500  Comprehensive metabolic panel  (TPN Lab Panel)  Every Mon,Thu (0500),   R      12/26/21 1229   12/27/21 0500  Magnesium  (TPN Lab Panel)  Every Mon,Thu (0500),   R      12/26/21 1229   12/27/21 0500  Phosphorus  (TPN Lab Panel)  Every Mon,Thu (0500),   R      12/26/21 1229   12/27/21 0500  Triglycerides  (TPN Lab Panel)  Every Mon,Thu (0500),   R      12/26/21 1229   12/22/21 0500  CBC with Differential/Platelet  Daily,   R      12/21/21 1543          Data Reviewed: I have personally reviewed following labs and imaging studies CBC: Recent Labs  Lab 12/27/21 0433 12/28/21 0314 12/29/21 0308 12/30/21 0303 12/31/21 0328  WBC 8.4 9.0 14.2* 15.5* 16.8*  NEUTROABS 6.4 6.9 12.3* 11.7* 13.1*  HGB 9.8* 8.8* 9.7* 8.7* 8.5*  HCT 28.5* 25.5* 27.3* 24.6* 24.2*  MCV 77.0* 76.3* 74.0* 75.2* 75.2*  PLT 239 218 208 195 841   Basic Metabolic Panel: Recent Labs  Lab 12/27/21 0433 12/28/21 0314 12/29/21 0308 12/30/21 0303 12/31/21 0328  NA 141 138 141 137 136  K 3.3* 3.4* 3.5 4.1 4.5  CL 108 104 107 106 104  CO2 '27 28 28 25 25  '$ GLUCOSE 108* 112* 168* 120* 114*  BUN '9 10 12 14 13  '$ CREATININE 0.66 0.53 0.43* 0.40* 0.45  CALCIUM 8.0* 7.7* 8.2* 7.8*  8.0*  MG 1.8 1.8 1.8 1.8 1.9  PHOS 2.2* 3.6 1.8* 3.1 4.0   GFR: Estimated Creatinine Clearance: 73.5 mL/min (by C-G formula based on SCr of 0.45 mg/dL). Liver Function Tests: Recent Labs  Lab 12/27/21 0433 12/31/21 0328  AST 10* 18  ALT 8 12  ALKPHOS 74 86  BILITOT 0.3 0.4  PROT 6.9 6.5  ALBUMIN 2.1* 2.1*   No results for input(s): "LIPASE", "AMYLASE" in the last 168 hours.  No results for input(s): "AMMONIA" in the last 168  hours. Coagulation Profile: No results for input(s): "INR", "PROTIME" in the last 168 hours.  BNP (last 3 results) No results for input(s): "PROBNP" in the last 8760 hours. HbA1C: No results for input(s): "HGBA1C" in the last 72 hours.  CBG: Recent Labs  Lab 12/30/21 0503 12/30/21 1154 12/30/21 1809 12/31/21 0020 12/31/21 0602  GLUCAP 136* 152* 135* 135* 131*   Lipid Profile: Recent Labs    12/31/21 0328  TRIG 57    Thyroid Function Tests: No results for input(s): "TSH", "T4TOTAL", "FREET4", "T3FREE", "THYROIDAB" in the last 72 hours. Sepsis Labs: No results for input(s): "PROCALCITON", "LATICACIDVEN" in the last 168 hours.   No results found for this or any previous visit (from the past 240 hour(s)).   Antimicrobials: Anti-infectives (From admission, onward)    Start     Dose/Rate Route Frequency Ordered Stop   12/28/21 1030  ceFAZolin (ANCEF) IVPB 2g/100 mL premix        2 g 200 mL/hr over 30 Minutes Intravenous  Once 12/28/21 1021 12/28/21 1156   12/28/21 1019  ceFAZolin (ANCEF) 2-4 GM/100ML-% IVPB       Note to Pharmacy: [place]: cabinet override      12/28/21 1019 12/28/21 1154      Culture/Microbiology    Component Value Date/Time   SDES  12/21/2021 0800    BLOOD RIGHT HAND Performed at [facility], [place] [address], [place], [place] [address]    SPECREQUEST  12/21/2021 0800    BOTTLES DRAWN AEROBIC ONLY Blood Culture adequate volume Performed at [facility]  [address] [place], [place] [address]    CULT  12/21/2021 0800    NO GROWTH 5 DAYS Performed at [facility], [place] [address], [place], [place] [address]    REPTSTATUS 12/26/2021 FINAL 12/21/2021 0800    Other culture-see note  Radiology Studies: DG CHEST PORT 1 VIEW  Result Date: 12/31/2021 CLINICAL DATA:  Leukocytosis EXAM: PORTABLE CHEST 1 VIEW COMPARISON:  12/21/2021 FINDINGS: Patient rotated to the right. Nasogastric tube terminates in the herniated stomach with side port likely just below the gastroesophageal junction. No pleural effusion or pneumothorax. Large hiatal hernia with abdominal organs positioned in the lower left hemithorax. No congestive failure. Volume loss in the left lung base. Clear right lung. Right PICC line terminates at the superior caval/atrial junction. IMPRESSION: Large hiatal hernia. Volume loss within the adjacent left lung base, similar. Nasogastric tube currently positioned within the herniated stomach. Cardiomegaly without congestive failure. Electronically Signed   By: Abigail Miyamoto M.D.   On: 12/31/2021 09:21     LOS: 11 days   Antonieta Pert, MD [facility]  12/31/2021, 11:27 AM

## 2022-01-01 DIAGNOSIS — Z7401 Bed confinement status: Secondary | ICD-10-CM

## 2022-01-01 DIAGNOSIS — K440 Diaphragmatic hernia with obstruction, without gangrene: Secondary | ICD-10-CM

## 2022-01-01 DIAGNOSIS — K56609 Unspecified intestinal obstruction, unspecified as to partial versus complete obstruction: Secondary | ICD-10-CM | POA: Diagnosis not present

## 2022-01-01 LAB — CBC WITH DIFFERENTIAL/PLATELET
Abs Immature Granulocytes: 0.55 10*3/uL — ABNORMAL HIGH (ref 0.00–0.07)
Basophils Absolute: 0 10*3/uL (ref 0.0–0.1)
Basophils Relative: 0 %
Eosinophils Absolute: 0.2 10*3/uL (ref 0.0–0.5)
Eosinophils Relative: 2 %
HCT: 23.9 % — ABNORMAL LOW (ref 36.0–46.0)
Hemoglobin: 8.4 g/dL — ABNORMAL LOW (ref 12.0–15.0)
Immature Granulocytes: 4 %
Lymphocytes Relative: 11 %
Lymphs Abs: 1.5 10*3/uL (ref 0.7–4.0)
MCH: 26 pg (ref 26.0–34.0)
MCHC: 35.1 g/dL (ref 30.0–36.0)
MCV: 74 fL — ABNORMAL LOW (ref 80.0–100.0)
Monocytes Absolute: 0.8 10*3/uL (ref 0.1–1.0)
Monocytes Relative: 6 %
Neutro Abs: 11 10*3/uL — ABNORMAL HIGH (ref 1.7–7.7)
Neutrophils Relative %: 77 %
Platelets: 273 10*3/uL (ref 150–400)
RBC: 3.23 MIL/uL — ABNORMAL LOW (ref 3.87–5.11)
RDW: 17.2 % — ABNORMAL HIGH (ref 11.5–15.5)
WBC: 14.2 10*3/uL — ABNORMAL HIGH (ref 4.0–10.5)
nRBC: 0.5 % — ABNORMAL HIGH (ref 0.0–0.2)

## 2022-01-01 LAB — GLUCOSE, CAPILLARY
Glucose-Capillary: 137 mg/dL — ABNORMAL HIGH (ref 70–99)
Glucose-Capillary: 123 mg/dL — ABNORMAL HIGH (ref 70–99)
Glucose-Capillary: 121 mg/dL — ABNORMAL HIGH (ref 70–99)

## 2022-01-01 LAB — TSH: TSH: 5.848 u[IU]/mL — ABNORMAL HIGH (ref 0.350–4.500)

## 2022-01-01 LAB — HEPARIN LEVEL (UNFRACTIONATED)
Heparin Unfractionated: 0.69 IU/mL (ref 0.30–0.70)
Heparin Unfractionated: >1.1 IU/mL — ABNORMAL HIGH (ref 0.30–0.70)
Heparin Unfractionated: 0.7 IU/mL (ref 0.30–0.70)

## 2022-01-01 MED ORDER — ALUM & MAG HYDROXIDE-SIMETH 200-200-20 MG/5ML PO SUSP: 30.0000 mL | Freq: Four times a day (QID) | ORAL | Status: DC | PRN

## 2022-01-01 MED ORDER — MENTHOL 3 MG MT LOZG: 1.0000 | LOZENGE | OROMUCOSAL | Status: DC | PRN

## 2022-01-01 MED ORDER — TRAVASOL 10 % IV SOLN
INTRAVENOUS | Status: DC
  Filled 2022-01-01: qty 0

## 2022-01-01 MED ORDER — METHOCARBAMOL 1000 MG/10ML IJ SOLN: 1000.0000 mg | Freq: Four times a day (QID) | INTRAVENOUS | Status: DC | PRN

## 2022-01-01 MED ORDER — SIMETHICONE 40 MG/0.6ML PO SUSP
80.0000 mg | Freq: Four times a day (QID) | ORAL | Status: DC | PRN
  Filled 2022-01-01: qty 1.2

## 2022-01-01 MED ORDER — SIMETHICONE 40 MG/0.6ML PO SUSP: 80.0000 mg | Freq: Four times a day (QID) | ORAL | Status: DC | PRN

## 2022-01-01 MED ORDER — HEPARIN (PORCINE) 25000 UT/250ML-% IV SOLN
1500.0000 [IU]/h | INTRAVENOUS | Status: DC
  Administered 2022-01-01: 1500 [IU]/h via INTRAVENOUS
  Filled 2022-01-01 (×3): qty 250

## 2022-01-01 MED ORDER — DIPHENHYDRAMINE HCL 50 MG/ML IJ SOLN
12.5000 mg | Freq: Four times a day (QID) | INTRAMUSCULAR | Status: DC | PRN
  Administered 2022-01-03: 12.5 mg via INTRAVENOUS
  Filled 2022-01-01 (×2): qty 1

## 2022-01-01 MED ORDER — LIP MEDEX EX OINT
TOPICAL_OINTMENT | Freq: Two times a day (BID) | CUTANEOUS | Status: DC
  Administered 2022-01-01 – 2022-01-05 (×5): 75 via TOPICAL
  Administered 2022-01-06: 1 via TOPICAL
  Filled 2022-01-01 (×3): qty 7

## 2022-01-01 MED ORDER — MAGIC MOUTHWASH
15.0000 mL | Freq: Four times a day (QID) | ORAL | Status: DC | PRN
  Filled 2022-01-01: qty 15

## 2022-01-01 MED ORDER — BISACODYL 10 MG RE SUPP
10.0000 mg | Freq: Every day | RECTAL | Status: DC
  Administered 2022-01-01 – 2022-01-10 (×7): 10 mg via RECTAL
  Filled 2022-01-01 (×8): qty 1

## 2022-01-01 MED ORDER — ACETAMINOPHEN 650 MG RE SUPP: 650.0000 mg | Freq: Four times a day (QID) | RECTAL | Status: DC | PRN

## 2022-01-01 MED ORDER — PHENOL 1.4 % MT LIQD
2.0000 | OROMUCOSAL | Status: DC | PRN
  Filled 2022-01-01: qty 177

## 2022-01-01 MED ORDER — METOCLOPRAMIDE HCL 5 MG/ML IJ SOLN
5.0000 mg | Freq: Four times a day (QID) | INTRAMUSCULAR | Status: AC
  Administered 2022-01-01 – 2022-01-03 (×8): 5 mg via INTRAVENOUS
  Filled 2022-01-01 (×8): qty 2

## 2022-01-01 MED ORDER — HEPARIN (PORCINE) 25000 UT/250ML-% IV SOLN
1400.0000 [IU]/h | INTRAVENOUS | Status: DC
  Administered 2022-01-02: 1400 [IU]/h via INTRAVENOUS
  Filled 2022-01-01: qty 250

## 2022-01-01 NOTE — Progress Notes (Signed)
Katrina Donaldson [id number] 29-Aug-1955  CARE TEAM:  PCP: Donald Prose, MD  Outpatient Care Team: Patient Care Team: Donald Prose, MD as PCP - General (Family Medicine) Skeet Latch, MD as PCP - Cardiology (Cardiology)  Inpatient Treatment Team: Treatment Team: Attending Provider: Antonieta Pert, MD; Rounding Team: Edison Pace, Md, MD; Rounding Team: Redmond Baseman, MD; Rounding Team: Lbcardiology, Rounding, MD; Registered Nurse: Marcille Buffy, RN; Utilization Review: Barbara Cower, RN; Social Worker: Lowella Curb, LCSW   Problem List:   Principal Problem:   SBO (small bowel obstruction) [facility]) Active Problems:   Essential hypertension   Anemia, iron deficiency   Hypothyroidism   Hypercoagulable state ([place])   Hypokalemia   Chronic anticoagulation   Cecum mass   Sacral osteomyelitis ([place])   AKI (acute kidney injury) ([place])   Pressure ulcer of sacral region, stage 3 ([place])   Cardiomyopathy ([place])   4 Days Post-Op  12/28/2021  PRE-OPERATIVE DIAGNOSIS: small bowel obstruction   POST-OPERATIVE DIAGNOSIS:  Same   PROCEDURE:   Exploratory laparotomy and lysis of adhesions.  Excisional biopsy of mesenteric nodule   SURGEON:  Stark Klein, MD  FINDINGS:  two bands that were independently causing obstruction  Assessment  Persistent ileus.  [facility] Stay = 12 days)  Plan:   -Awaiting return for bowel function.  Not surprising patient has prolonged ileus given the bedridden state.  Hard to assess by history -Back on suction. -We will try clamping again with scheduled metoclopramide with liquids and do residual checks.  If she tolerates that well with evidence of some bowel movement, DC NG tube and advance tomorrow.  If not, may need to consider CAT scan or more aggressive work-up to rule out other reasons.  Certainly she is high risk for prolonged ileus coming with an obstruction and no mobility. -Follow-up on pathology. a small mesenteric nodule was sent for permanent section  - path is pending. -TPN for full nutrition for now until ileus resolves  Afebrile, HR 109, WBC 14 > 15 > 16 (check CXR, she still has a foley, low suspicion for intra-abd infection at this point) - difficult to gather if patient is truly having any signs of bowel function but her abdomen is soft and non-distended. No N/V. She has a hiatal hernia with some gastric dilatation on her CXR - id like to hook her NG tube to LIWS to check residuals before we consider removal.sips of clears/with meds ok. PT/OT as able  Remainder of care per primary team Urinary diversion given bedridden state with decubiti.  Transition from Foley to external catheter wick.  Defer to primary service.    -Follow-up pathology on excisional biopsy mesenteric nodule    Disposition: Per primary team       I reviewed nursing notes, hospitalist notes, last 24 h vitals and pain scores, last 48 h intake and output, last 24 h labs and trends, and last 24 h imaging results. I have reviewed this patient's available data, including medical history, events of note, test results, etc as part of my evaluation.  A significant portion of that time was spent in counseling.  Care during the described time interval was provided by me.  This care required moderate level of medical decision making.  01/01/2022    Subjective: (Chief complaint)  No major events.  Still feeling bloated and nauseated.  Nasal gastric tube back to suction.  Not drinking much.  Objective:  Vital signs:  Vitals:   12/31/21 2300 01/01/22 0139 01/01/22 0303 01/01/22 0938  BP:   125/86 (!) 135/98  Pulse:  (!) 108 (!) 111 (!) 112  Resp: '18  20 20  '$ Temp:   98.6 F (37 C) 98.4 F (36.9 C)  TempSrc:   Oral Oral  SpO2:   97% 97%  Weight:      Height:        Last BM Date : 12/30/21  Intake/Output   Yesterday:  07/03 0701 - 07/04 0700 In: 2784.1 [P.O.:150; I.V.:2634.1] Out: 2675 [Urine:2625; Emesis/NG output:50] This shift:  Total I/O In:  10 [I.V.:10] Out: -   Bowel function:  Flatus: No  BM:  No  Drain: Nasogastric tube with lightly bile tinged effluent.  I flushed and unplugged with a few 100 mL of return.   Physical Exam:  General: Pt awake/alert in no acute distress Eyes: PERRL, normal EOM.  Sclera clear.  No icterus Neuro: CN II-XII intact w/o focal sensory/motor deficits.  Not particularly interactive. Lymph: No head/neck/groin lymphadenopathy Psych:  No delerium/psychosis/paranoia.  Oriented x 4 HENT: Normocephalic, Mucus membranes moist.  No thrush Neck: Supple, No tracheal deviation.  No obvious thyromegaly Chest: No pain to chest wall compression.  Good respiratory excursion.  No audible wheezing CV:  Pulses intact.  Regular rhythm.  No major extremity edema Abdomen: Soft.  Moderately distended.  Mildly tender at incisions only.  No evidence of peritonitis.  No incarcerated hernias.     Results:   Cultures: Recent Results (from the past 720 hour(s))  Resp Panel by RT-PCR (Flu A&B, Covid) Anterior Nasal Swab     Status: None   Collection Time: 12/20/21 12:40 PM   Specimen: Anterior Nasal Swab  Result Value Ref Range Status   SARS Coronavirus 2 by RT PCR NEGATIVE NEGATIVE Final    Comment: (NOTE) SARS-CoV-2 target nucleic acids are NOT DETECTED.  The SARS-CoV-2 RNA is generally detectable in upper respiratory specimens during the acute phase of infection. The lowest concentration of SARS-CoV-2 viral copies this assay can detect is 138 copies/mL. A negative result does not preclude SARS-Cov-2 infection and should not be used as the sole basis for treatment or other patient management decisions. A negative result may occur with  improper specimen collection/handling, submission of specimen other than nasopharyngeal swab, presence of viral mutation(s) within the areas targeted by this assay, and inadequate number of viral copies(<138 copies/mL). A negative result must be combined with clinical  observations, patient history, and epidemiological information. The expected result is Negative.  Fact Sheet for Patients:  EntrepreneurPulse.com.au  Fact Sheet for Healthcare Providers:  IncredibleEmployment.be  This test is no t yet approved or cleared by the [place] FDA and  has been authorized for detection and/or diagnosis of SARS-CoV-2 by FDA under an Emergency Use Authorization (EUA). This EUA will remain  in effect (meaning this test can be used) for the duration of the COVID-19 declaration under Section 564(b)(1) of the Act, 21 U.S.C.section 360bbb-3(b)(1), unless the authorization is terminated  or revoked sooner.       Influenza A by PCR NEGATIVE NEGATIVE Final   Influenza B by PCR NEGATIVE NEGATIVE Final    Comment: (NOTE) The Xpert Xpress SARS-CoV-2/FLU/RSV plus assay is intended as an aid in the diagnosis of influenza from Nasopharyngeal swab specimens and should not be used as a sole basis for treatment. Nasal washings and aspirates are unacceptable for Xpert Xpress SARS-CoV-2/FLU/RSV testing.  Fact Sheet for Patients: EntrepreneurPulse.com.au  Fact Sheet for Healthcare Providers: IncredibleEmployment.be  This test is not yet approved or cleared  by the [place] and has been authorized for detection and/or diagnosis of SARS-CoV-2 by FDA under an Emergency Use Authorization (EUA). This EUA will remain in effect (meaning this test can be used) for the duration of the COVID-19 declaration under Section 564(b)(1) of the Act, 21 U.S.C. section 360bbb-3(b)(1), unless the authorization is terminated or revoked.  Performed at [facility], [place] [address], [place], [place] [address]   Culture, blood (Routine X 2) w Reflex to ID Panel     Status: None   Collection Time: 12/21/21  7:54 AM   Specimen: BLOOD  Result Value Ref Range Status   Specimen Description    Final    BLOOD LEFT ANTECUBITAL Performed at [place] [address], [place], [place] [address]    Special Requests   Final    BOTTLES DRAWN AEROBIC AND ANAEROBIC Blood Culture adequate volume Performed at [place] [address], [place], [place] [address]    Culture   Final    NO GROWTH 5 DAYS Performed at [facility], [place] [address], [place], [place] [address]    Report Status 12/26/2021 FINAL  Final  Culture, blood (Routine X 2) w Reflex to ID Panel     Status: None   Collection Time: 12/21/21  8:00 AM   Specimen: BLOOD RIGHT HAND  Result Value Ref Range Status   Specimen Description   Final    BLOOD RIGHT HAND Performed at [place] [address], [place], [place] [address]    Special Requests   Final    BOTTLES DRAWN AEROBIC ONLY Blood Culture adequate volume Performed at [facility] [address] [place], [place] [address]    Culture   Final    NO GROWTH 5 DAYS Performed at [facility], [place] [address], [place], [place] [address]    Report Status 12/26/2021 FINAL  Final    Labs: Results for orders placed or performed during the hospital encounter of 12/20/21 (from the past 48 hour(s))  Glucose, capillary     Status: Abnormal   Collection Time: 12/30/21 11:54 AM  Result Value Ref Range   Glucose-Capillary 152 (H) 70 - 99 mg/dL    Comment: Glucose reference range applies only to samples taken after fasting for at least 8 hours.  Heparin level (unfractionated)     Status: Abnormal   Collection Time: 12/30/21  2:45 PM  Result Value Ref Range   Heparin Unfractionated <0.10 (L) 0.30 - 0.70 IU/mL    Comment: (NOTE) The clinical reportable range upper limit is being lowered to >1.10 to align with the FDA approved guidance for the current laboratory assay.  If heparin results are below expected values, and patient dosage has  been confirmed, suggest follow up  testing of antithrombin III levels. Performed at [facility], [place] [address], [place], [place] [address]   Glucose, capillary     Status: Abnormal   Collection Time: 12/30/21  6:09 PM  Result Value Ref Range   Glucose-Capillary 135 (H) 70 - 99 mg/dL    Comment: Glucose reference range applies only to samples taken after fasting for at least 8 hours.  Heparin level (unfractionated)     Status: None   Collection Time: 12/30/21 11:37 PM  Result Value Ref Range   Heparin Unfractionated 0.60 0.30 - 0.70 IU/mL    Comment: (NOTE) The clinical reportable range upper limit is being lowered  to >1.10 to align with the FDA approved guidance for the current laboratory assay.  If heparin results are below expected values, and patient dosage has  been confirmed, suggest follow up testing of antithrombin III levels. Performed at [facility], [place] [address] [place], [place] [address]   Glucose, capillary     Status: Abnormal   Collection Time: 12/31/21 12:20 AM  Result Value Ref Range   Glucose-Capillary 135 (H) 70 - 99 mg/dL    Comment: Glucose reference range applies only to samples taken after fasting for at least 8 hours.  CBC with Differential/Platelet     Status: Abnormal   Collection Time: 12/31/21  3:28 AM  Result Value Ref Range   WBC 16.8 (H) 4.0 - 10.5 K/uL   RBC 3.22 (L) 3.87 - 5.11 MIL/uL   Hemoglobin 8.5 (L) 12.0 - 15.0 g/dL    Comment: Reticulocyte Hemoglobin testing may be clinically indicated, consider ordering this additional test [id number]    HCT 24.2 (L) 36.0 - 46.0 %   MCV 75.2 (L) 80.0 - 100.0 fL   MCH 26.4 26.0 - 34.0 pg   MCHC 35.1 30.0 - 36.0 g/dL   RDW 17.4 (H) 11.5 - 15.5 %   Platelets 227 150 - 400 K/uL   nRBC 0.2 0.0 - 0.2 %   Neutrophils Relative % 79 %   Neutro Abs 13.1 (H) 1.7 - 7.7 K/uL   Lymphocytes Relative 10 %   Lymphs Abs 1.7 0.7 - 4.0 K/uL   Monocytes Relative 5 %   Monocytes Absolute 0.9 0.1 - 1.0 K/uL    Eosinophils Relative 2 %   Eosinophils Absolute 0.3 0.0 - 0.5 K/uL   Basophils Relative 0 %   Basophils Absolute 0.1 0.0 - 0.1 K/uL   Immature Granulocytes 4 %   Abs Immature Granulocytes 0.70 (H) 0.00 - 0.07 K/uL    Comment: Performed at [facility], [place] [address], [place], [place] [address]  Comprehensive metabolic panel     Status: Abnormal   Collection Time: 12/31/21  3:28 AM  Result Value Ref Range   Sodium 136 135 - 145 mmol/L   Potassium 4.5 3.5 - 5.1 mmol/L   Chloride 104 98 - 111 mmol/L   CO2 25 22 - 32 mmol/L   Glucose, Bld 114 (H) 70 - 99 mg/dL    Comment: Glucose reference range applies only to samples taken after fasting for at least 8 hours.   BUN 13 8 - 23 mg/dL   Creatinine, Ser 0.45 0.44 - 1.00 mg/dL   Calcium 8.0 (L) 8.9 - 10.3 mg/dL   Total Protein 6.5 6.5 - 8.1 g/dL   Albumin 2.1 (L) 3.5 - 5.0 g/dL   AST 18 15 - 41 U/L   ALT 12 0 - 44 U/L   Alkaline Phosphatase 86 38 - 126 U/L   Total Bilirubin 0.4 0.3 - 1.2 mg/dL   GFR, Estimated >60 >60 mL/min    Comment: (NOTE) Calculated using the CKD-EPI Creatinine Equation (2021)    Anion gap 7 5 - 15    Comment: Performed at [facility], [place] [address], [place], [place] [address]  Magnesium     Status: None   Collection Time: 12/31/21  3:28 AM  Result Value Ref Range   Magnesium 1.9 1.7 - 2.4 mg/dL    Comment: Performed at [facility], [place] [address], [place], [place] [address]  Phosphorus     Status: None  Collection Time: 12/31/21  3:28 AM  Result Value Ref Range   Phosphorus 4.0 2.5 - 4.6 mg/dL    Comment: Performed at [facility], [place] [address], [place], [place] [address]  Triglycerides     Status: None   Collection Time: 12/31/21  3:28 AM  Result Value Ref Range   Triglycerides 57 <150 mg/dL    Comment: Performed at [facility], [place] [address] [place], [place] [address]  Glucose, capillary     Status:  Abnormal   Collection Time: 12/31/21  6:02 AM  Result Value Ref Range   Glucose-Capillary 131 (H) 70 - 99 mg/dL    Comment: Glucose reference range applies only to samples taken after fasting for at least 8 hours.  Urinalysis, Routine w reflex microscopic Urine, Catheterized     Status: Abnormal   Collection Time: 12/31/21  7:29 AM  Result Value Ref Range   Color, Urine STRAW (A) YELLOW   APPearance CLEAR CLEAR   Specific Gravity, Urine 1.011 1.005 - 1.030   pH 6.0 5.0 - 8.0   Glucose, UA NEGATIVE NEGATIVE mg/dL   Hgb urine dipstick NEGATIVE NEGATIVE   Bilirubin Urine NEGATIVE NEGATIVE   Ketones, ur NEGATIVE NEGATIVE mg/dL   Protein, ur NEGATIVE NEGATIVE mg/dL   Nitrite NEGATIVE NEGATIVE   Leukocytes,Ua NEGATIVE NEGATIVE    Comment: Performed at [place] [address] [place], [place] [address]  Heparin level (unfractionated)     Status: None   Collection Time: 12/31/21  8:06 AM  Result Value Ref Range   Heparin Unfractionated 0.55 0.30 - 0.70 IU/mL    Comment: (NOTE) The clinical reportable range upper limit is being lowered to >1.10 to align with the FDA approved guidance for the current laboratory assay.  If heparin results are below expected values, and patient dosage has  been confirmed, suggest follow up testing of antithrombin III levels. Performed at [facility], [place] [address], [place], [place] [address]   Glucose, capillary     Status: Abnormal   Collection Time: 12/31/21 11:32 AM  Result Value Ref Range   Glucose-Capillary 128 (H) 70 - 99 mg/dL    Comment: Glucose reference range applies only to samples taken after fasting for at least 8 hours.  Glucose, capillary     Status: Abnormal   Collection Time: 12/31/21  5:30 PM  Result Value Ref Range   Glucose-Capillary 122 (H) 70 - 99 mg/dL    Comment: Glucose reference range applies only to samples taken after fasting for at least 8 hours.  Glucose, capillary     Status:  Abnormal   Collection Time: 12/31/21 11:06 PM  Result Value Ref Range   Glucose-Capillary 143 (H) 70 - 99 mg/dL    Comment: Glucose reference range applies only to samples taken after fasting for at least 8 hours.  CBC with Differential/Platelet     Status: Abnormal   Collection Time: 01/01/22  3:35 AM  Result Value Ref Range   WBC 14.2 (H) 4.0 - 10.5 K/uL   RBC 3.23 (L) 3.87 - 5.11 MIL/uL   Hemoglobin 8.4 (L) 12.0 - 15.0 g/dL    Comment: Reticulocyte Hemoglobin testing may be clinically indicated, consider ordering this additional test [id number]    HCT 23.9 (L) 36.0 - 46.0 %   MCV 74.0 (L) 80.0 - 100.0 fL   MCH 26.0 26.0 - 34.0 pg   MCHC 35.1 30.0 - 36.0 g/dL   RDW 17.2 (H)  11.5 - 15.5 %   Platelets 273 150 - 400 K/uL   nRBC 0.5 (H) 0.0 - 0.2 %   Neutrophils Relative % 77 %   Neutro Abs 11.0 (H) 1.7 - 7.7 K/uL   Lymphocytes Relative 11 %   Lymphs Abs 1.5 0.7 - 4.0 K/uL   Monocytes Relative 6 %   Monocytes Absolute 0.8 0.1 - 1.0 K/uL   Eosinophils Relative 2 %   Eosinophils Absolute 0.2 0.0 - 0.5 K/uL   Basophils Relative 0 %   Basophils Absolute 0.0 0.0 - 0.1 K/uL   Immature Granulocytes 4 %   Abs Immature Granulocytes 0.55 (H) 0.00 - 0.07 K/uL    Comment: Performed at [facility], [place] [address], [place], [place] [address]  Heparin level (unfractionated)     Status: None   Collection Time: 01/01/22  3:35 AM  Result Value Ref Range   Heparin Unfractionated 0.69 0.30 - 0.70 IU/mL    Comment: (NOTE) The clinical reportable range upper limit is being lowered to >1.10 to align with the FDA approved guidance for the current laboratory assay.  If heparin results are below expected values, and patient dosage has  been confirmed, suggest follow up testing of antithrombin III levels. Performed at [facility], [place] [address] [place], [place] [address]   TSH     Status: Abnormal   Collection Time: 01/01/22  3:35 AM  Result Value Ref  Range   TSH 5.848 (H) 0.350 - 4.500 uIU/mL    Comment: Performed by a 3rd Generation assay with a functional sensitivity of <=0.01 uIU/mL. Performed at [facility], [place] [address], [place],  [address]   Glucose, capillary     Status: Abnormal   Collection Time: 01/01/22  6:04 AM  Result Value Ref Range   Glucose-Capillary 137 (H) 70 - 99 mg/dL    Comment: Glucose reference range applies only to samples taken after fasting for at least 8 hours.    Imaging / Studies: DG CHEST PORT 1 VIEW  Result Date: 12/31/2021 CLINICAL DATA:  Leukocytosis EXAM: PORTABLE CHEST 1 VIEW COMPARISON:  12/21/2021 FINDINGS: Patient rotated to the right. Nasogastric tube terminates in the herniated stomach with side port likely just below the gastroesophageal junction. No pleural effusion or pneumothorax. Large hiatal hernia with abdominal organs positioned in the lower left hemithorax. No congestive failure. Volume loss in the left lung base. Clear right lung. Right PICC line terminates at the superior caval/atrial junction. IMPRESSION: Large hiatal hernia. Volume loss within the adjacent left lung base, similar. Nasogastric tube currently positioned within the herniated stomach. Cardiomegaly without congestive failure. Electronically Signed   By: Abigail Miyamoto M.D.   On: 12/31/2021 09:21    Medications / Allergies: per chart  Antibiotics: Anti-infectives (From admission, onward)    Start     Dose/Rate Route Frequency Ordered Stop   12/28/21 1030  ceFAZolin (ANCEF) IVPB 2g/100 mL premix        2 g 200 mL/hr over 30 Minutes Intravenous  Once 12/28/21 1021 12/28/21 1156   12/28/21 1019  ceFAZolin (ANCEF) 2-4 GM/100ML-% IVPB       Note to Pharmacy: Christell Faith L: cabinet override      12/28/21 1019 12/28/21 1154         Note: Portions of this report may have been transcribed using voice recognition software. Every effort was made to ensure accuracy; however, inadvertent  computerized transcription errors may be present.   Any  transcriptional errors that result from this process are unintentional.    Adin Hector, MD, FACS, MASCRS Esophageal, Gastrointestinal & Colorectal Surgery Robotic and Minimally Invasive Surgery  [facility], [place]  [place] [address], [place], [place] [address] [phone] Fax [phone] Main  CONTACT INFORMATION:  Weekday (9AM-5PM): Call [facility] main office at [phone]  Weeknight (5PM-9AM) or Weekend/Holiday: Check [email or web address] (password " [email or web address]") for General Surgery [facility] coverage  (Please, do not use SecureChat as it is not reliable communication to operating surgeons for immediate patient care)      01/01/2022  8:48 AM

## 2022-01-01 NOTE — Progress Notes (Signed)
PHARMACY - TOTAL PARENTERAL NUTRITION CONSULT NOTE   Indication: Small bowel obstruction  Patient Measurements: Height: '5\' 6"'$  (167.6 cm) Weight: 84.1 kg (185 lb 6.5 oz) IBW/kg (Calculated) : 59.3 TPN AdjBW (KG): 65.5 Body mass index is 29.93 kg/m.  Assessment:  66 yo F presents with abdominal pain, nausea, vomiting, and poor appetite x 5 days. 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 No BM for 5 days as well. NG output was 1.6 L and xray shows SBO. Going for ex lap, lysis of adhesions and possible bowel resection on 6/29. Does report shellfish allergy but not any fish. Reacted to shrimp but not crab and was not an anaphylactic reaction. Pharmacy consulted for TPN.  Glucose / Insulin: No hx of DM. CBGs ok. SSI used 4 units / 24 hrs. - 6/30 Dexamethasone '4mg'$   Electrolytes: WNL (last on 7/3) Renal: SCr low, BUN wnl. Hepatic:  LFTs and Trig ok. Albumin low at 2.1. Intake / Output; MIVF: IVF at [facility]; UOP 2625 mL, NGT output 53m GI Surgeries / Procedures:  6/30 Exploratory laparotomy and lysis of adhesions.   Central access: PICC placed 6/29 TPN start date: 6/29  Nutritional Goals: Goal TPN rate is 80 mL/hr (provides 90 g of protein and 2,085 kcals per day)  RD Assessment: Estimated Needs Total Energy Estimated Needs: 1950-2150 Total Protein Estimated Needs: 85-95g Total Fluid Estimated Needs: 2L/day  Current Nutrition:  Clear liquids and TPN 7/2 Clamp NG and try clear liquids  Plan:  At 18:00: Continue TPN at goal rate 855mhr Electrolytes in TPN: Na 5035mL, K 26m47m, Ca 5mEq13m Mg 7mEq/77mand Phos 20mmol64mCl:Ac 1:1 Add standard MVI and trace elements to TPN Continue Sensitive q6h SSI and adjust as needed  Continue D51[facility]2NS [facility] T[facility] labs on Mon/Thurs F/U possible advancement of oral diet as tolerated   ChristiGretta Arab, BCPS Clinical Pharmacist [facility] main pharmacy [phone] 7:21 AM

## 2022-01-01 NOTE — Progress Notes (Signed)
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 [name], NP notified, said he'll look in to it. Currently pt is on ST at 112.

## 2022-01-01 NOTE — TOC Progression Note (Signed)
Transition of Care [facility]) - Progression Note    Patient Details  Name: Katrina Donaldson MRN: [id number] Date of Birth: [birth year]  Transition of Care [facility]) CM/SW Contact  Lennart Pall, LCSW Phone Number: 01/01/2022, 12:53 PM  Clinical Narrative:    TOC continuing to follow along for dc planning/ return to [facility] when medically cleared.   Expected Discharge Plan: [place] Barriers to Discharge: Continued Medical Work up  Expected Discharge Plan and Services Expected Discharge Plan: [place] In-house Referral: Clinical Social Work   Post Acute Care Choice: [place] Living arrangements for the past 2 months: [place]                 DME Arranged: N/A DME Agency: NA                   Social Determinants of Health (SDOH) Interventions    Readmission Risk Interventions     No data to display

## 2022-01-01 NOTE — Progress Notes (Signed)
[place] for heparin Indication: history of DVT on rivaroxaban PTA, hypercoagulable state  Allergies  Allergen Reactions   Shellfish-Derived Products Other (See Comments)    ALLERGIC TO SHRIMP!! Made the tongue "feel strange" Per patient, she has since eaten crab legs and did not have another reaction.     Patient Measurements: Height: '5\' 6"'$  (167.6 cm) Weight: 84.1 kg (185 lb 6.5 oz) IBW/kg (Calculated) : 59.3 Heparin Dosing Weight: 76 kg  Vital Signs: Temp: 98.4 F (36.9 C) (07/04 0608) Temp Source: Oral (07/04 0608) BP: 135/98 (07/04 1497) Pulse Rate: 112 (07/04 0608)  Labs: Recent Labs    12/30/21 0303 12/30/21 1445 12/30/21 2337 12/31/21 0328 12/31/21 0806 01/01/22 0335  HGB 8.7*  --   --  8.5*  --  8.4*  HCT 24.6*  --   --  24.2*  --  23.9*  PLT 195  --   --  227  --  273  HEPARINUNFRC 0.21*   < > 0.60  --  0.55 0.69  CREATININE 0.40*  --   --  0.45  --   --    < > = values in this interval not displayed.     Estimated Creatinine Clearance: 73.5 mL/min (by C-G formula based on SCr of 0.45 mg/dL).   Medications: Rivaroxaban 20 mg PO Daily PTA.  Last dose: 6/21  Assessment: Pt is a 30 yoF admitted with SBO and initially treated with conservative management. PMH significant for DVT in 2018, hypercoagulable state on Xarelto PTA.   Xarelto held on admission, was on enoxaparin for DVT ppx from 6/22 > 6/27. Heparin drip was started on 6/27, but has been intermittently infusing as it was held for possible surgery on 6/28, 6/29, and 6/30 AM.  Patient underwent ex lap and lysis of adhesions 6/30. Heparin drip was held preop and resumed 8 hours postop.    01/01/2022  Heparin level 0.69 - remains therapeutic but increased to upper end of goal range on heparin drip at 1550 units/hr,  Infusing through the PICC line since 7/2 CBC:  Hgb remains low at 8.4, Plt WNL No issues with bleeding reported  Goal of Therapy:  Heparin level  0.3-0.7 units/ml Monitor platelets by anticoagulation protocol: Yes   Plan:  Decrease to heparin infusion at 1500 units/hr  Heparin level in 8 hours Heparin level, CBC daily  Follow up ability to tolerate enteral meds and change back to home Xarelto when able.     Gretta Arab PharmD, BCPS Clinical Pharmacist [facility] main pharmacy [phone] 01/01/2022 7:14 AM

## 2022-01-01 NOTE — Progress Notes (Signed)
PROGRESS NOTE Katrina Donaldson  IOX:[id number] DOB: 1955-11-05 DOA: 12/20/2021 PCP: Donald Prose, MD   Brief Narrative/Hospital Course: 66 y.o.f w/ history significant of laparoscopic partial colectomy in 01/2021 2/2 cecal mass, thyroidectomy, brain aneurysm, chronic anticoagulation, hypertension, hypothyroidism, CVA, sacral osteomyelitis, wheelchair-bound presented from SNF complaining of abdominal pain, nausea, vomiting, poor appetite  x 5 days, last BM 5 days PTA. In the ED, vital signs noted for tachycardia otherwise fairly stable, labs noted for mild anemia, otherwise stable.  CT abdomen/pelvis showed high-grade SBO likely due to adhesions, large hiatal hernia.  Surgery consulted and patient admitted for further management  Patient has failed to progress despite having NG tube decompression IV fluid hydration. She was noted to have tachycardia abnormal EKG, no chest pain troponins are negative, EKG shows sinus tachycardia. Echocardiogram showed decreased EF 30-35%. Labs with improved/stable renal function, stable hemoglobin at 10.2 g.  Seen by cardiology.  Sbo did not improve subsequently underwent ex laparotomy with lysis of adhesion by Dr Barry Dienes 6/30    Subjective: Seen and examined.  Not much verbal this morning but states he is listening, denies flatus yet.  Stable Overnight no fever, leukocytosis downtrending NGT clamped 7/2-tolerated x24 hours without nausea and vomiting and to LIS 6/3 Foley out 7/3  Assessment and Plan: Principal Problem:   SBO (small bowel obstruction) ([place]) Active Problems:   Essential hypertension   Anemia, iron deficiency   Hypothyroidism   Hypercoagulable state ([place])   Hypokalemia   Chronic anticoagulation   Cecum mass   Sacral osteomyelitis (HCC)   AKI (acute kidney injury) ([place])   Pressure ulcer of sacral region, stage 3 (HCC)   Cardiomyopathy (HCC)   SBO secondary to adhesions: s/p ex laparotomy with lysis of adhesion by Dr Barry Dienes 6/30, after it did  not improve on conservative management.continue TPN, surgery managing-NGT clamped 7/2-tolerated clamping x24 hours without nausea or vomiting>NG tube to LIWS  7/3> 7/4-surgery planning on clamping NG again w/ scheduled Reglan with liquid do residual check. Continue pain control DVT prophylaxis and ambulation PT OT.  Follow-up pathology on Biopsy from mesenteric nodule.  X-ray 7/3 large hiatal hernia with NG tube in stomach  Large hiatal hernia: Continue PPI twice daily Leukocytosis postop: WBC count now downtrending remains afebrile, no UTI.  Foley off Hypernatremia w/ Hyperchloremia:Resolved  Hypokalemia resolved   Cardiomyopathy w/ new acute systolic dysfunction with EF 30 to 35% Essential hypertension Sinus tachycardia: Patient's tachycardia,new systolic dysfunction likely in the setting of acute illness, appreciate cardiology input.still in sinus tachycardia continue lopressor q4hr prn for hr>120 per cardio discussion.  Likely tachycardia mediated cardiomyopathy, on metoprolol 5 mg scheduled per cardiology.  Monitor telemetry.  She will need repeat echo once acute illness resolves and outpatient CHF follow-up.  CHF is compensated.  Continue to monitor intake output, watch for fluid overload- noted some wt changes-if symptomatic add lasix, await Cardio recs today if anything needs to be adjusted for tachycardia .Net IO Since Admission: 7,297.76 mL [01/01/22 0723]  Filed Weights   12/23/21 0600 12/26/21 1230 12/28/21 0949  Weight: 80.4 kg 84.1 kg 84.1 kg    Normocytic anemia Iron deficiency Anemia of chronic disease:  Hb holding stable in 8s-transfuse if less than 7 g.  Recent Labs  Lab 12/28/21 0314 12/29/21 0308 12/30/21 0303 12/31/21 0328 01/01/22 0335  HGB 8.8* 9.7* 8.7* 8.5* 8.4*  HCT 25.5* 27.3* 24.6* 24.2* 23.9*   GERD:cont IV PPI. QAS:TMHD to holding statin. Fever-several days ago, no recurrence/resolved Acute urine retention-resolved. Off foley 7/3  Hypothyroidism: cont IV  Synthroid TIR:WERXVQMG. Hypophosphatemia resolved.monitor.  History of DVT starting in 2005//Hypercoagulable state Chronic DVT ON [place] 6/28: Xarelto on hold- On heparin drip for now per pharmacy  Goals of care full code, prognosis remains to be seen given patient's acute multiple medical problems including bowel obstruction and acute systolic dysfunction.  Stage III pressure ulcer on sacrum POA: Continue wound care and offloading. Ext foley Pressure Injury 12/20/21 Sacrum Posterior Stage 3 -  Full thickness tissue loss. Subcutaneous fat may be visible but bone, tendon or muscle are NOT exposed. Red (Active)  12/20/21 1950  Location:Sacrum  Location Orientation:Posterior  Staging:Stage 3-Full thickness tissue loss. Subcutaneous fat may be visible but bone, tendon or muscle are NOT exposed.  Wound Description (Comments):Red  Present on Admission:Yes  Dressing Type Foam:Lift dressing to assess site every shift. 12/26/21 1008  DVT prophylaxis: SCDs Start: 12/20/21 1811 Code Status:   Code Status: Full Code Family Communication: Discussed with patient's family previously including daughters,   Patient status is: INPATIENT because of SBO management. Level of care: Telemetry  Dispo: The patient is from: HOME            Anticipated disposition: Remains hospitalized for further postop course   Mobility Assessment (last 72 hours)     Mobility Assessment     Row Name 12/31/21 2030 12/31/21 0945 12/30/21 2128 12/30/21 13:32:35 12/29/21 1200   Does patient have an order for bedrest or is patient medically unstable No - Continue assessment No - Continue assessment No - Continue assessment No - Continue assessment Yes- Bedfast (Level 1) - Complete   What is the highest level of mobility based on the progressive mobility assessment? Level 1 (Bedfast) - Unable to balance while sitting on edge of bed Level 2 (Chairfast) - Balance while sitting on edge of bed and cannot stand Level 1 (Bedfast) - Unable  to balance while sitting on edge of bed Level 1 (Bedfast) - Unable to balance while sitting on edge of bed Level 1 (Bedfast) - Unable to balance while sitting on edge of bed   Is the above level different from baseline mobility prior to current illness? -- -- -- --  pt refuses PT/OT Yes - Recommend PT order           Objective: Vitals last 24 hrs: Vitals:   12/31/21 2300 01/01/22 0139 01/01/22 0303 01/01/22 0608  BP:   125/86 (!) 135/98  Pulse:  (!) 108 (!) 111 (!) 112  Resp: '18  20 20  '$ Temp:   98.6 F (37 C) 98.4 F (36.9 C)  TempSrc:   Oral Oral  SpO2:   97% 97%  Weight:      Height:       Weight change:   Physical Examination: General exam: AA oriented does not seem to be interested in conversation, covering nose and mouth with mask NGT in place HEENT:Oral mucosa moist, Ear/Nose WNL grossly, dentition normal. Respiratory system: bilaterally diminished, no use of accessory muscle Cardiovascular system: S1 & S2 +, No JVD,. Gastrointestinal system: Abdomen soft, mildly tender with surgical site dressing in place bowel sounds sluggish  Nervous System:Alert, awake, moving extremities and grossly nonfocal Extremities: LE ankle edema eng, distal peripheral pulses palpable.  Skin: No rashes,no icterus. MSK: Normal muscle bulk,tone, power   Medications reviewed:  Scheduled Meds:  Chlorhexidine Gluconate Cloth  6 each Topical Daily   fluticasone  1 spray Each Nare BID   insulin aspart  0-9 Units Subcutaneous Q6H  levothyroxine  100 mcg Intravenous Daily   metoprolol tartrate  5 mg Intravenous Q6H   pantoprazole (PROTONIX) IV  40 mg Intravenous Q12H   sodium chloride flush  10-40 mL Intracatheter Q12H  Continuous Infusions:  dextrose 5 % and 0.45% NaCl 10 mL/hr at 12/29/21 0641   heparin     TPN ADULT (ION) 80 mL/hr at 12/31/21 1717   Diet Order             Diet clear liquid Room service appropriate? Yes; Fluid consistency: Thin  Diet effective now                  Nutrition Problem: Inadequate oral intake Etiology: acute illness, nausea, vomiting Signs/Symptoms: NPO status Interventions: TPN   Intake/Output Summary (Last 24 hours) at 01/01/2022 0723 Last data filed at 01/01/2022 0600 Gross per 24 hour  Intake 2706.41 ml  Output 2675 ml  Net 31.41 ml    Net IO Since Admission: 7,297.76 mL [01/01/22 0723]  Wt Readings from Last 3 Encounters:  12/28/21 84.1 kg  09/11/21 77.1 kg  01/31/21 89.1 kg     Unresulted Labs (From admission, onward)     Start     Ordered   01/01/22 1600  Heparin level (unfractionated)  Once-Timed,   TIMED       Question:  Specimen collection method  Answer:  IV Team=IV Team collect   01/01/22 0718   01/01/22 0500  Heparin level (unfractionated)  Daily,   R     Question:  Specimen collection method  Answer:  IV Team=IV Team collect   12/31/21 0852   12/27/21 0500  Comprehensive metabolic panel  (TPN Lab Panel)  Every Mon,Thu (0500),   R      12/26/21 1229   12/27/21 0500  Magnesium  (TPN Lab Panel)  Every Mon,Thu (0500),   R      12/26/21 1229   12/27/21 0500  Phosphorus  (TPN Lab Panel)  Every Mon,Thu (0500),   R      12/26/21 1229   12/27/21 0500  Triglycerides  (TPN Lab Panel)  Every Mon,Thu (0500),   R      12/26/21 1229   12/22/21 0500  CBC with Differential/Platelet  Daily,   R      12/21/21 1543          Data Reviewed: I have personally reviewed following labs and imaging studies CBC: Recent Labs  Lab 12/28/21 0314 12/29/21 0308 12/30/21 0303 12/31/21 0328 01/01/22 0335  WBC 9.0 14.2* 15.5* 16.8* 14.2*  NEUTROABS 6.9 12.3* 11.7* 13.1* 11.0*  HGB 8.8* 9.7* 8.7* 8.5* 8.4*  HCT 25.5* 27.3* 24.6* 24.2* 23.9*  MCV 76.3* 74.0* 75.2* 75.2* 74.0*  PLT 218 208 195 227 882    Basic Metabolic Panel: Recent Labs  Lab 12/27/21 0433 12/28/21 0314 12/29/21 0308 12/30/21 0303 12/31/21 0328  NA 141 138 141 137 136  K 3.3* 3.4* 3.5 4.1 4.5  CL 108 104 107 106 104  CO2 '27 28 28 25 25  '$ GLUCOSE 108*  112* 168* 120* 114*  BUN '9 10 12 14 13  '$ CREATININE 0.66 0.53 0.43* 0.40* 0.45  CALCIUM 8.0* 7.7* 8.2* 7.8* 8.0*  MG 1.8 1.8 1.8 1.8 1.9  PHOS 2.2* 3.6 1.8* 3.1 4.0    GFR: Estimated Creatinine Clearance: 73.5 mL/min (by C-G formula based on SCr of 0.45 mg/dL). Liver Function Tests: Recent Labs  Lab 12/27/21 0433 12/31/21 0328  AST 10* 18  ALT 8 12  ALKPHOS 74 86  BILITOT 0.3 0.4  PROT 6.9 6.5  ALBUMIN 2.1* 2.1*    No results for input(s): "LIPASE", "AMYLASE" in the last 168 hours.  No results for input(s): "AMMONIA" in the last 168 hours. Coagulation Profile: No results for input(s): "INR", "PROTIME" in the last 168 hours.  BNP (last 3 results) No results for input(s): "PROBNP" in the last 8760 hours. HbA1C: No results for input(s): "HGBA1C" in the last 72 hours.  CBG: Recent Labs  Lab 12/31/21 0602 12/31/21 1132 12/31/21 1730 12/31/21 2306 01/01/22 0604  GLUCAP 131* 128* 122* 143* 137*    Lipid Profile: Recent Labs    12/31/21 0328  TRIG 57     Thyroid Function Tests: Recent Labs    01/01/22 0335  TSH 5.848*   Sepsis Labs: No results for input(s): "PROCALCITON", "LATICACIDVEN" in the last 168 hours.   No results found for this or any previous visit (from the past 240 hour(s)).   Antimicrobials: Anti-infectives (From admission, onward)    Start     Dose/Rate Route Frequency Ordered Stop   12/28/21 1030  ceFAZolin (ANCEF) IVPB 2g/100 mL premix        2 g 200 mL/hr over 30 Minutes Intravenous  Once 12/28/21 1021 12/28/21 1156   12/28/21 1019  ceFAZolin (ANCEF) 2-4 GM/100ML-% IVPB       Note to Pharmacy: [place]: cabinet override      12/28/21 1019 12/28/21 1154      Culture/Microbiology    Component Value Date/Time   SDES  12/21/2021 0800    BLOOD RIGHT HAND Performed at [facility], [place] [address], [place], [place] [address]    SPECREQUEST  12/21/2021 0800    BOTTLES DRAWN AEROBIC ONLY Blood Culture  adequate volume Performed at [facility] [address] [place], [place] [address]    CULT  12/21/2021 0800    NO GROWTH 5 DAYS Performed at [facility], [place] [address] [place], [place] [address]    REPTSTATUS 12/26/2021 FINAL 12/21/2021 0800    Other culture-see note  Radiology Studies: DG CHEST PORT 1 VIEW  Result Date: 12/31/2021 CLINICAL DATA:  Leukocytosis EXAM: PORTABLE CHEST 1 VIEW COMPARISON:  12/21/2021 FINDINGS: Patient rotated to the right. Nasogastric tube terminates in the herniated stomach with side port likely just below the gastroesophageal junction. No pleural effusion or pneumothorax. Large hiatal hernia with abdominal organs positioned in the lower left hemithorax. No congestive failure. Volume loss in the left lung base. Clear right lung. Right PICC line terminates at the superior caval/atrial junction. IMPRESSION: Large hiatal hernia. Volume loss within the adjacent left lung base, similar. Nasogastric tube currently positioned within the herniated stomach. Cardiomegaly without congestive failure. Electronically Signed   By: Abigail Miyamoto M.D.   On: 12/31/2021 09:21     LOS: 12 days   Antonieta Pert, MD [facility]  01/01/2022, 7:23 AM

## 2022-01-01 NOTE — Progress Notes (Signed)
Progress Note  Patient Name: Katrina Donaldson Date of Encounter: 01/01/2022  [facility] Cardiologist: Skeet Latch, MD   Subjective   Laying in bed, NG tube in place.  No significant shortness of breath.  Inpatient Medications    Scheduled Meds:  bisacodyl  10 mg Rectal Daily   Chlorhexidine Gluconate Cloth  6 each Topical Daily   fluticasone  1 spray Each Nare BID   insulin aspart  0-9 Units Subcutaneous Q6H   levothyroxine  100 mcg Intravenous Daily   lip balm   Topical BID   metoprolol tartrate  5 mg Intravenous Q6H   pantoprazole (PROTONIX) IV  40 mg Intravenous Q12H   sodium chloride flush  10-40 mL Intracatheter Q12H   Continuous Infusions:  dextrose 5 % and 0.45% NaCl 10 mL/hr at 12/29/21 0641   heparin 1,500 Units/hr (01/01/22 0824)   methocarbamol (ROBAXIN) IV     TPN ADULT (ION) 80 mL/hr at 01/01/22 0837   TPN ADULT (ION)     PRN Meds: albuterol, alum & mag hydroxide-simeth, azelastine, hydrALAZINE, hydrocortisone, lip balm, magic mouthwash, menthol-cetylpyridinium, methocarbamol (ROBAXIN) IV, metoprolol tartrate, morphine injection, ondansetron **OR** ondansetron (ZOFRAN) IV, phenol, polyvinyl alcohol, simethicone, sodium chloride, sodium chloride flush   Vital Signs    Vitals:   12/31/21 2300 01/01/22 0139 01/01/22 0303 01/01/22 0608  BP:   125/86 (!) 135/98  Pulse:  (!) 108 (!) 111 (!) 112  Resp: '18  20 20  '$ Temp:   98.6 F (37 C) 98.4 F (36.9 C)  TempSrc:   Oral Oral  SpO2:   97% 97%  Weight:      Height:        Intake/Output Summary (Last 24 hours) at 01/01/2022 0921 Last data filed at 01/01/2022 0831 Gross per 24 hour  Intake 2716.41 ml  Output 2675 ml  Net 41.41 ml      12/28/2021    9:49 AM 12/26/2021   12:30 PM 12/23/2021    6:00 AM  Last 3 Weights  Weight (lbs) 185 lb 6.5 oz 185 lb 6.5 oz 177 lb 4 oz  Weight (kg) 84.1 kg 84.1 kg 80.4 kg      Telemetry    Sinus tachycardia around 100 bpm.  She did have transient type I [place]  on telemetry-benign- Personally Reviewed  ECG    Sinus tachycardia 116 nonspecific T wave changes- Personally Reviewed  Physical Exam   GEN: No acute distress.   Neck: No JVD Cardiac: Tachycardic regular, no murmurs, rubs, or gallops.  Respiratory: Clear to auscultation bilaterally. GI: Soft, nontender, non-distended  MS: No edema; No deformity. Neuro:  Nonfocal  Psych: Normal affect   Labs    High Sensitivity Troponin:   Recent Labs  Lab 12/26/21 1658  TROPONINIHS 12     Chemistry Recent Labs  Lab 12/27/21 0433 12/28/21 0314 12/29/21 0308 12/30/21 0303 12/31/21 0328  NA 141   < > 141 137 136  K 3.3*   < > 3.5 4.1 4.5  CL 108   < > 107 106 104  CO2 27   < > '28 25 25  '$ GLUCOSE 108*   < > 168* 120* 114*  BUN 9   < > '12 14 13  '$ CREATININE 0.66   < > 0.43* 0.40* 0.45  CALCIUM 8.0*   < > 8.2* 7.8* 8.0*  MG 1.8   < > 1.8 1.8 1.9  PROT 6.9  --   --   --  6.5  ALBUMIN 2.1*  --   --   --  2.1*  AST 10*  --   --   --  18  ALT 8  --   --   --  12  ALKPHOS 74  --   --   --  86  BILITOT 0.3  --   --   --  0.4  GFRNONAA >60   < > >60 >60 >60  ANIONGAP 6   < > '6 6 7   '$ < > = values in this interval not displayed.    Lipids  Recent Labs  Lab 12/31/21 0328  TRIG 57    Hematology Recent Labs  Lab 12/30/21 0303 12/31/21 0328 01/01/22 0335  WBC 15.5* 16.8* 14.2*  RBC 3.27* 3.22* 3.23*  HGB 8.7* 8.5* 8.4*  HCT 24.6* 24.2* 23.9*  MCV 75.2* 75.2* 74.0*  MCH 26.6 26.4 26.0  MCHC 35.4 35.1 35.1  RDW 16.9* 17.4* 17.2*  PLT 195 227 273   Thyroid  Recent Labs  Lab 01/01/22 0335  TSH 5.848*    BNPNo results for input(s): "BNP", "PROBNP" in the last 168 hours.  DDimer No results for input(s): "DDIMER" in the last 168 hours.   Radiology    DG CHEST PORT 1 VIEW  Result Date: 12/31/2021 CLINICAL DATA:  Leukocytosis EXAM: PORTABLE CHEST 1 VIEW COMPARISON:  12/21/2021 FINDINGS: Patient rotated to the right. Nasogastric tube terminates in the herniated stomach with side  port likely just below the gastroesophageal junction. No pleural effusion or pneumothorax. Large hiatal hernia with abdominal organs positioned in the lower left hemithorax. No congestive failure. Volume loss in the left lung base. Clear right lung. Right PICC line terminates at the superior caval/atrial junction. IMPRESSION: Large hiatal hernia. Volume loss within the adjacent left lung base, similar. Nasogastric tube currently positioned within the herniated stomach. Cardiomegaly without congestive failure. Electronically Signed   By: Abigail Miyamoto M.D.   On: 12/31/2021 09:21    Cardiac Studies   ECHO: 12/26/2021  1. Left ventricular ejection fraction, by estimation, is 30 to 35%. The  left ventricle has moderately decreased function. The left ventricle  demonstrates global hypokinesis. Left ventricular diastolic parameters are  consistent with Grade I diastolic dysfunction (impaired relaxation).   2. Right ventricular systolic function is normal. The right ventricular  size is normal.   3. The mitral valve is normal in structure. No evidence of mitral valve  regurgitation. No evidence of mitral stenosis.   4. The aortic valve is calcified. There is mild calcification of the  aortic valve. There is mild thickening of the aortic valve. Aortic valve  regurgitation is not visualized. Aortic valve sclerosis is present, with  no evidence of aortic valve stenosis.   5. The inferior vena cava is normal in size with greater than 50%  respiratory variability, suggesting right atrial pressure of 3 mmHg.    [place] LOWER EXTREMITY - DVT STUDY 12/26/2021 Comparison Study: 07/25/2016 - - Findings consistent with acute deep vein  thrombosis involving the right common femoral vein, right profunda femoris vein, right femoral vein, right popliteal vein, right posterial  tibial vein, and right peroneal vein.   Patient Profile     66 y.o. female post partial colectomy with high-grade small bowel obstruction large  hiatal hernia echocardiogram with a EF of 30%, new, with tachycardia.  Underwent lysis of adhesions on 6/30.  Assessment & Plan    Cardiomyopathy, EF 30% new from 2016 - Possibly secondary to tachycardia. - On metoprolol 5 mg every 8 hours IV - We will wait  on other goal-directed medical therapy when she is able to take p.o. medication.  Sinus tachycardia - Multifactorial from underlying cardiomyopathy as well as current metabolic condition.  Acute illness.  Continue with IV metoprolol as above.  Acute right-sided DVT lower extremity. - Currently on heparin IV. - Transition to p.o. when able.  Hypothyroidism - On Synthroid.  TSH currently 5.8.  Not hyperthyroid.  Small bowel obstruction status post lysis of adhesions - Surgical team on board.  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   For questions or updates, please contact [place] Please consult [email or web address] for contact info under        Signed, Candee Furbish, MD  01/01/2022, 9:21 AM

## 2022-01-01 NOTE — Progress Notes (Signed)
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 Made the tongue "feel strange" Per patient, she has since eaten crab legs and did not have another reaction.     Patient Measurements: Height: '5\' 6"'$  (167.6 cm) Weight: 84.1 kg (185 lb 6.5 oz) IBW/kg (Calculated) : 59.3 Heparin Dosing Weight: 77 kg  Vital Signs: Temp: 98.3 F (36.8 C) (07/04 1334) Temp Source: Oral (07/04 1334) BP: 137/85 (07/04 1757) Pulse Rate: 123 (07/04 1757)  Labs: Recent Labs    12/30/21 0303 12/30/21 1445 12/31/21 0328 12/31/21 0806 01/01/22 0335 01/01/22 1600 01/01/22 2036  HGB 8.7*  --  8.5*  --  8.4*  --   --   HCT 24.6*  --  24.2*  --  23.9*  --   --   PLT 195  --  227  --  273  --   --   HEPARINUNFRC 0.21*   < >  --    < > 0.69 >1.10* 0.70  CREATININE 0.40*  --  0.45  --   --   --   --    < > = values in this interval not displayed.     Estimated Creatinine Clearance: 73.5 mL/min (by C-G formula based on SCr of 0.45 mg/dL).   Medications: Rivaroxaban 20 mg PO Daily PTA.  Last dose: 6/21  Assessment: Pt is a 89 yoF admitted with SBO and initially treated with conservative management. PMH significant for DVT in 2018, hypercoagulable state on Xarelto PTA.   Xarelto held on admission, was on enoxaparin for DVT ppx from 6/22 > 6/27. Heparin drip was started on 6/27, but has been intermittently infusing as it was held for possible surgery on 6/28, 6/29, and 6/30 AM.  Patient underwent ex lap and lysis of adhesions 6/30. Heparin drip was held preop and resumed 8 hours postop.    01/01/2022  1600 heparin level resulted at > 1.1 units/mL. Per IV team nurse, heparin level was drawn from PICC. Heparin infusing through PICC line since 7/2, so this is erroneous result. Repeat heparin level (peripheral stick) = 0.7  units/mL, remains on upper end of goal range on heparin infusion at 1500 units/hr Confirmed with RN that heparin running at specified rate CBC:  Hgb remains low at 8.4, Plt WNL No bleeding or infusion issues noted per nursing   Goal of Therapy:  Heparin level 0.3-0.7 units/ml Monitor platelets by anticoagulation protocol: Yes   Plan:  Decrease heparin infusion to 1400 units/hr  Heparin level, CBC daily  Follow up ability to tolerate enteral meds and change back to home Xarelto when able.     Lindell Spar, PharmD, BCPS Clinical Pharmacist 01/01/2022 9:41 PM

## 2022-01-02 DIAGNOSIS — K56609 Unspecified intestinal obstruction, unspecified as to partial versus complete obstruction: Secondary | ICD-10-CM | POA: Diagnosis not present

## 2022-01-02 LAB — GLUCOSE, CAPILLARY
Glucose-Capillary: 102 mg/dL — ABNORMAL HIGH (ref 70–99)
Glucose-Capillary: 121 mg/dL — ABNORMAL HIGH (ref 70–99)
Glucose-Capillary: 81 mg/dL (ref 70–99)

## 2022-01-02 LAB — CBC WITH DIFFERENTIAL/PLATELET
Abs Immature Granulocytes: 0.34 10*3/uL — ABNORMAL HIGH (ref 0.00–0.07)
Basophils Absolute: 0.1 10*3/uL (ref 0.0–0.1)
Basophils Relative: 1 %
Eosinophils Absolute: 0.2 10*3/uL (ref 0.0–0.5)
Eosinophils Relative: 2 %
HCT: 28.8 % — ABNORMAL LOW (ref 36.0–46.0)
Hemoglobin: 9.8 g/dL — ABNORMAL LOW (ref 12.0–15.0)
Immature Granulocytes: 2 %
Lymphocytes Relative: 9 %
Lymphs Abs: 1.3 10*3/uL (ref 0.7–4.0)
MCH: 26.1 pg (ref 26.0–34.0)
MCHC: 34 g/dL (ref 30.0–36.0)
MCV: 76.6 fL — ABNORMAL LOW (ref 80.0–100.0)
Monocytes Absolute: 0.8 10*3/uL (ref 0.1–1.0)
Monocytes Relative: 6 %
Neutro Abs: 11.3 10*3/uL — ABNORMAL HIGH (ref 1.7–7.7)
Neutrophils Relative %: 80 %
Platelets: 262 10*3/uL (ref 150–400)
RBC: 3.76 MIL/uL — ABNORMAL LOW (ref 3.87–5.11)
RDW: 18 % — ABNORMAL HIGH (ref 11.5–15.5)
WBC: 14.1 10*3/uL — ABNORMAL HIGH (ref 4.0–10.5)
nRBC: 0.4 % — ABNORMAL HIGH (ref 0.0–0.2)

## 2022-01-02 LAB — BASIC METABOLIC PANEL
Anion gap: 10 (ref 5–15)
BUN: 14 mg/dL (ref 8–23)
CO2: 24 mmol/L (ref 22–32)
Calcium: 8.4 mg/dL — ABNORMAL LOW (ref 8.9–10.3)
Chloride: 98 mmol/L (ref 98–111)
Creatinine, Ser: 0.5 mg/dL (ref 0.44–1.00)
GFR, Estimated: >60 mL/min (ref >60)
Glucose, Bld: 104 mg/dL — ABNORMAL HIGH (ref 70–99)
Potassium: 5.7 mmol/L — ABNORMAL HIGH (ref 3.5–5.1)
Sodium: 132 mmol/L — ABNORMAL LOW (ref 135–145)

## 2022-01-02 LAB — PHOSPHORUS: Phosphorus: 4.8 mg/dL — ABNORMAL HIGH (ref 2.5–4.6)

## 2022-01-02 LAB — HEPARIN LEVEL (UNFRACTIONATED)
Heparin Unfractionated: 0.83 IU/mL — ABNORMAL HIGH (ref 0.30–0.70)
Heparin Unfractionated: 0.18 IU/mL — ABNORMAL LOW (ref 0.30–0.70)
Heparin Unfractionated: 0.33 IU/mL (ref 0.30–0.70)

## 2022-01-02 LAB — POTASSIUM: Potassium: 4.8 mmol/L (ref 3.5–5.1)

## 2022-01-02 LAB — MAGNESIUM: Magnesium: 2.1 mg/dL (ref 1.7–2.4)

## 2022-01-02 MED ORDER — TRAVASOL 10 % IV SOLN
INTRAVENOUS | Status: AC
  Filled 2022-01-02: qty 902.4

## 2022-01-02 MED ORDER — ENSURE ENLIVE PO LIQD
237.0000 mL | Freq: Three times a day (TID) | ORAL | Status: DC
  Administered 2022-01-02 – 2022-01-05 (×2): 237 mL via ORAL

## 2022-01-02 MED ORDER — DEXTROSE 10 % IV SOLN
INTRAVENOUS | Status: AC
  Filled 2022-01-02: qty 1000

## 2022-01-02 MED ORDER — HEPARIN (PORCINE) 25000 UT/250ML-% IV SOLN
1300.0000 [IU]/h | INTRAVENOUS | Status: DC
Start: 2022-01-02 — End: 2022-01-04
  Administered 2022-01-02 – 2022-01-03 (×2): 1200 [IU]/h via INTRAVENOUS
  Filled 2022-01-02 (×4): qty 250

## 2022-01-02 NOTE — Progress Notes (Signed)
No overt heart failure.  Lying fully supine in bed without respiratory difficulty. Has been removed and she is trialing clear liquids today. Fair rate control with beta-blockers IV (mostly 100-110).  BP is in desirable range. Waiting to be able to start p.o. medications.

## 2022-01-02 NOTE — Progress Notes (Signed)
Nutrition Follow-up  INTERVENTION:   -Ensure Plus High Protein po TID, each supplement provides 350 kcal and 20 grams of protein.  -TPN management per Pharmacy    NUTRITION DIAGNOSIS:   Inadequate oral intake related to acute illness, nausea, vomiting as evidenced by NPO status.  Ongoing.  GOAL:   Patient will meet greater than or equal to 90% of their needs  Meeting with TPN  MONITOR:   Diet advancement, Labs, Weight trends, Skin, I & O's, Other (Comment) (TPN regimen)  ASSESSMENT:   66 y.o. 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  6/22: admitted 6/29: TPN initiation 6/30: s/p ex lap, LOA  TPN stopped this morning at 8am d/t elevated K labs. Was rechecked and now K WNL. D10 has been hung for kcals.  Per Pharmacy note, no K added to TPN for tonight's bag. Goal rate for TPN is 80 ml/hr, providing 2085 kcals and 90g protein.  Patient now on full liquids, tolerated small amount of clears. Ensure supplements have been ordered, will monitor PO intakes.  Admission weight: 175 lbs. Last weight 6/30: 185 lbs Per nursing documentation, pt with mild BLE edema.  Medications: Dulcolax, Reglan, D10 infusion  Labs reviewed:  CBGs: 102-121 Low Na Elevated Phos (4.8)  Diet Order:   Diet Order             Diet full liquid Room service appropriate? Yes; Fluid consistency: Thin  Diet effective now                   EDUCATION NEEDS:   Not appropriate for education at this time  Skin:  Skin Assessment: Skin Integrity Issues: Skin Integrity Issues:: Incisions Stage III: sacrum Incisions: 6/30: abdomen  Last BM:  7/4 -type 6  Height:   Ht Readings from Last 1 Encounters:   12/28/21 '5\' 6"'$  (1.676 m)    Weight:   Wt Readings from Last 1 Encounters:  12/28/21 84.1 kg    BMI:  Body mass index is 29.93 kg/m.  Estimated Nutritional Needs:   Kcal:  1950-2150  Protein:  85-95g  Fluid:  2L/day  Clayton Bibles, MS, RD, LDN Inpatient Clinical Dietitian Contact information available via Amion

## 2022-01-02 NOTE — Progress Notes (Signed)
PHARMACY - TOTAL PARENTERAL NUTRITION CONSULT NOTE   Indication: Small bowel obstruction  Patient Measurements: Height: '5\' 6"'$  (167.6 cm) Weight: 84.1 kg (185 lb 6.5 oz) IBW/kg (Calculated) : 59.3 TPN AdjBW (KG): 65.5 Body mass index is 29.93 kg/m.  Assessment:  66 yo F presents with abdominal pain, NV and poor appetite x 5 days. 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 No BM for 5 days as well. NG output was 1.6 L and xray shows SBO. Going for ex lap, lysis of adhesions and possible bowel resection on 6/29. Does report shellfish allergy but not any fish. Reacted to shrimp but not crab and was not an anaphylactic reaction. Pharmacy consulted for TPN.  Glucose / Insulin: No hx of DM. CBGs <180 (120's-140s). 1 unit SSI/ 24 hrs. - 6/30 Dexamethasone '4mg'$   Electrolytes: Na 132 low, K 5.7 elevated, CoCa 9.9, Phos 4.8 elevated Renal: SCr 0.5.Excellent UOP Hepatic:  LFTs and Trig ok. Albumin low at 2.1. Intake / Output; MIVF: [facility] at [facility]; UOP 3350 mL, NGT output 15m, Stool x 2 - po Intake 540 on 7/4 (CLD) GI Surgeries / Procedures:  6/30 Exploratory laparotomy and lysis of adhesions.   Central access: PICC placed 6/29 TPN start date: 6/29  Nutritional Goals: Goal TPN rate is 80 mL/hr (provides 90 g of protein and 2,085 kcals per day)  RD Assessment: Estimated Needs Total Energy Estimated Needs: 1950-2150 Total Protein Estimated Needs: 85-95g Total Fluid Estimated Needs: 2L/day  Current Nutrition:  Clear liquids and TPN 7/2 Clamp NG and try clear liquids  Plan:  Hold TPN this AM and hang D10W at 89mhr until next bag. Discussed with RN At 18:00: Continue TPN at goal rate 8069mr Electrolytes in TPN: Na 27m85m, K 0mEq54m Ca 5mEq/71mMg 7mEq/L76mnd Phos 10mmol/76ml:Ac 1:1 Add standard MVI and trace elements to TPN Continue Sensitive q6h SSI and adjust as needed  [facility] labs on Mon/Thurs and PRN F/U possible advancement of  oral diet as tolerated Change meds to po as diet advanced.   Luda Charbonneau S. RobertsoAlford Highland, BCPS Clinical Staff Pharmacist [email or web address] 01/02/2022 7:19 AM

## 2022-01-02 NOTE — Progress Notes (Signed)
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 In the ED, vital signs noted for tachycardia otherwise fairly stable, labs noted for mild anemia, otherwise stable.  CT abdomen/pelvis showed high-grade SBO likely due to adhesions, large hiatal hernia.  Surgery consulted and patient admitted for further management  Patient has failed to progress despite having NG tube decompression IV fluid hydration. She was noted to have tachycardia abnormal EKG, no chest pain troponins are negative, EKG shows sinus tachycardia. Echocardiogram showed decreased EF 30-35%. Labs with improved/stable renal function, stable hemoglobin at 10.2 g.  Seen by cardiology.  Sbo did not improve subsequently underwent ex laparotomy with lysis of adhesion by Dr Barry Dienes 6/30.patient continued on NG decompression and clamping trial-ng fell off 7/4-keeping it off.  Awaiting return of bowel function    Subjective: Seen examined  She is tired thsi am  Had 3 BM, loose yesterday Overnight afebrile Labs with potassium high at 5.7> TPN stopped at 8 am Leukocytosis at 16.8>14.2>14.1 Foley out 7/3  Assessment and Plan: Principal Problem:   SBO (small bowel obstruction) (HCC) Active Problems:   Essential hypertension   Anemia, iron deficiency   Hypothyroidism   Hypercoagulable state ([place])   Hypokalemia   Confusion   Chronic anticoagulation   Cecum mass   Sacral osteomyelitis (HCC)   Pressure injury of sacral region, stage 4 (HCC)   AKI (acute kidney injury) ([place])   Pressure ulcer of sacral region, stage 3 ([place])    Cardiomyopathy ([place])   Bedridden   Incarcerated hiatal hernia   SBO secondary to adhesions: s/p ex laparotomy with lysis of adhesion by Dr Barry Dienes 6/30, after it did not improve on conservative management.continue TPN, surgery managing-NGT clamped 7/2-/4-ng fell off 7/4-keeping it off per ccs. and clear liquid diet,scheduled Reglan. .  Continue plan of care as per surgical service Continue pain control DVT prophylaxis and ambulation PT OT.  Follow-up pathology on Biopsy from mesenteric nodule.  X-ray 7/3 large hiatal hernia with NG tube in stomach  Large hiatal hernia: on PPI twice daily Leukocytosis postop: WBC count now downtrending remains afebrile, no UTI.  Foley off.  Monitor Hypernatremia w/ Hyperchloremia:Resolved  Hypokalemia resolved  Hyperkalemia-2/2 TPN-pharmacy stopped TPN, palced on D10- repeat K at 11, if still high will do lokelma otherwise hold for now given post bowel surgery  Cardiomyopathy w/ new acute systolic dysfunction with EF 30 to 35% Essential hypertension Sinus tachycardia: Likely tachycardia mediated acute systolic dysfunction in the setting of acute illness.  Cardiology following, on metoprolol 5 mg scheduled/prn, monitor in tele.She will need repeat echo once acute illness resolves and outpatient CHF follow-up.  CHF is compensated.  Continue to monitor intake output, watch for fluid overload- noted some wt changes-if symptomatic add lasix, await Cardio recs today if anything needs to be adjusted for tachycardia .Net IO Since Admission: 5,665.8 mL [01/02/22 0905]  Filed Weights   12/23/21 0600 12/26/21 1230 12/28/21 0949  Weight: 80.4 kg 84.1 kg 84.1 kg    Normocytic anemia Iron deficiency Anemia of chronic disease:  Hb trending up slightly.Transfuse if less than 7 g.  Recent Labs  Lab 12/29/21 0308 12/30/21 0303 12/31/21 4854 01/01/22 0335 01/02/22 6270  HGB 9.7* 8.7* 8.5* 8.4* 9.8*  HCT 27.3* 24.6* 24.2* 23.9* 28.8*  GERD:cont IV PPI. MBT:DHRCBU on  hold. Fever-several days ago, no recurrence/resolved Acute urine retention-resolved. Off foley 7/3 Hypothyroidism: cont IV Synthroid LAG:TXMIWOEH. Hypophosphatemia resolved.monitor.  History of DVT starting in 2005//Hypercoagulable state Chronic DVT ON [place] 6/28: Xarelto on hold-remains on heparin drip-dosing/monitoring per pharmacy  Goals of care full code, prognosis remains to be seen given patient's acute multiple medical problems including bowel obstruction and acute systolic dysfunction.  Stage III pressure ulcer on sacrum POA: Continue wound care and offloading. Ext foley Pressure Injury 12/20/21 Sacrum Posterior Stage 3 -  Full thickness tissue loss. Subcutaneous fat may be visible but bone, tendon or muscle are NOT exposed. Red (Active)  12/20/21 1950  Location:Sacrum  Location Orientation:Posterior  Staging:Stage 3-Full thickness tissue loss. Subcutaneous fat may be visible but bone, tendon or muscle are NOT exposed.  Wound Description (Comments):Red  Present on Admission:Yes  Dressing Type Foam:Lift dressing to assess site every shift. 12/26/21 1008  DVT prophylaxis: SCDs Start: 12/20/21 1811 Code Status:   Code Status: Full Code Family Communication: Discussed with patient's family previously including daughter/sister,   Patient status is: INPATIENT because of SBO management, awaiting return of bowel function Level of care: Telemetry  Dispo: The patient is from: HOME            Anticipated disposition: Remains hospitalized, awaiting return of bowel function  Mobility Assessment (last 72 hours)     Mobility Assessment     Row Name 01/02/22 0843 01/01/22 0900 12/31/21 2030 12/31/21 0945 12/30/21 2128   Does patient have an order for bedrest or is patient medically unstable No - Continue assessment No - Continue assessment No - Continue assessment No - Continue assessment No - Continue assessment   What is the highest level of mobility based on the progressive mobility  assessment? Level 1 (Bedfast) - Unable to balance while sitting on edge of bed Level 1 (Bedfast) - Unable to balance while sitting on edge of bed Level 1 (Bedfast) - Unable to balance while sitting on edge of bed Level 2 (Chairfast) - Balance while sitting on edge of bed and cannot stand Level 1 (Bedfast) - Unable to balance while sitting on edge of bed   Is the above level different from baseline mobility prior to current illness? Yes - Recommend PT order Yes - Recommend PT order -- -- --    [place] Name 12/30/21 13:32:35           Does patient have an order for bedrest or is patient medically unstable No - Continue assessment       What is the highest level of mobility based on the progressive mobility assessment? Level 1 (Bedfast) - Unable to balance while sitting on edge of bed       Is the above level different from baseline mobility prior to current illness? --  pt refuses PT/OT               Objective: Vitals last 24 hrs: Vitals:   01/01/22 1334 01/01/22 1757 01/01/22 2100 01/02/22 0627  BP: 136/74 137/85 (!) 145/86 120/69  Pulse: (!) 109 (!) 123 (!) 115 (!) 102  Resp:    16  Temp: 98.3 F (36.8 C)  98.4 F (36.9 C) 98.6 F (37 C)  TempSrc: Oral  Oral Oral  SpO2: 98%  94%   Weight:      Height:       Weight change:  Physical Examination: General exam: AA,appears depressed, HEENT:Oral mucosa moist, Ear/Nose WNL grossly, dentition normal. Respiratory system: bilaterally diminished, no use of accessory muscle Cardiovascular system: S1 & S2 +, No JVD,. Gastrointestinal system: Abdomen soft,NT, surgical site with foam dressing, BS+ Nervous System:Alert, awake, moving extremities and grossly nonfocal Extremities: LE ankle edema neg, distal peripheral pulses palpable.  Skin: No rashes,no icterus. MSK: Normal muscle bulk,tone, power   Medications reviewed:  Scheduled Meds:  bisacodyl  10 mg Rectal Daily   Chlorhexidine Gluconate Cloth  6 each Topical Daily   fluticasone   1 spray Each Nare BID   insulin aspart  0-9 Units Subcutaneous Q6H   levothyroxine  100 mcg Intravenous Daily   lip balm   Topical BID   metoCLOPramide (REGLAN) injection  5 mg Intravenous Q6H   metoprolol tartrate  5 mg Intravenous Q6H   pantoprazole (PROTONIX) IV  40 mg Intravenous Q12H   sodium chloride flush  10-40 mL Intracatheter Q12H  Continuous Infusions:  dextrose 80 mL/hr at 01/02/22 0841   dextrose 5 % and 0.45% NaCl 10 mL/hr at 12/29/21 0641   heparin 1,200 Units/hr (01/02/22 0656)   methocarbamol (ROBAXIN) IV     Diet Order             Diet clear liquid Room service appropriate? Yes; Fluid consistency: Thin  Diet effective now                 Nutrition Problem: Inadequate oral intake Etiology: acute illness, nausea, vomiting Signs/Symptoms: NPO status Interventions: TPN   Intake/Output Summary (Last 24 hours) at 01/02/2022 0905 Last data filed at 01/02/2022 0841 Gross per 24 hour  Intake 1808.04 ml  Output 3450 ml  Net -1641.96 ml   Net IO Since Admission: 5,665.8 mL [01/02/22 0905]  Wt Readings from Last 3 Encounters:  12/28/21 84.1 kg  09/11/21 77.1 kg  01/31/21 89.1 kg     Unresulted Labs (From admission, onward)     Start     Ordered   01/02/22 1500  Heparin level (unfractionated)  Once-Timed,   TIMED       Question:  Specimen collection method  Answer:  IV Team=IV Team collect   01/02/22 0600   01/02/22 1100  Potassium  Once-Timed,   TIMED       Question:  Specimen collection method  Answer:  IV Team=IV Team collect   01/02/22 0732   01/02/22 0500  Heparin level (unfractionated)  Daily,   R     Comments: Please draw heparin level via peripheral stick. Heparin is infusing through PICC line.    01/01/22 1654   12/27/21 0500  Comprehensive metabolic panel  (TPN Lab Panel)  Every Mon,Thu (0500),   R      12/26/21 1229   12/27/21 0500  Magnesium  (TPN Lab Panel)  Every Mon,Thu (0500),   R      12/26/21 1229   12/27/21 0500  Phosphorus  (TPN Lab  Panel)  Every Mon,Thu (0500),   R      12/26/21 1229   12/27/21 0500  Triglycerides  (TPN Lab Panel)  Every Mon,Thu (0500),   R      12/26/21 1229   12/22/21 0500  CBC with Differential/Platelet  Daily,   R      12/21/21 1543          Data Reviewed: I have personally reviewed following labs and imaging studies CBC: Recent Labs  Lab 12/29/21 0308 12/30/21 0303 12/31/21 9449 01/01/22 0335  01/02/22 0458  WBC 14.2* 15.5* 16.8* 14.2* 14.1*  NEUTROABS 12.3* 11.7* 13.1* 11.0* 11.3*  HGB 9.7* 8.7* 8.5* 8.4* 9.8*  HCT 27.3* 24.6* 24.2* 23.9* 28.8*  MCV 74.0* 75.2* 75.2* 74.0* 76.6*  PLT 208 195 227 273 275   Basic Metabolic Panel: Recent Labs  Lab 12/28/21 0314 12/29/21 0308 12/30/21 0303 12/31/21 0328 01/02/22 0458  NA 138 141 137 136 132*  K 3.4* 3.5 4.1 4.5 5.7*  CL 104 107 106 104 98  CO2 '28 28 25 25 24  '$ GLUCOSE 112* 168* 120* 114* 104*  BUN '10 12 14 13 14  '$ CREATININE 0.53 0.43* 0.40* 0.45 0.50  CALCIUM 7.7* 8.2* 7.8* 8.0* 8.4*  MG 1.8 1.8 1.8 1.9 2.1  PHOS 3.6 1.8* 3.1 4.0 4.8*   GFR: Estimated Creatinine Clearance: 73.5 mL/min (by C-G formula based on SCr of 0.5 mg/dL). Liver Function Tests: Recent Labs  Lab 12/27/21 0433 12/31/21 0328  AST 10* 18  ALT 8 12  ALKPHOS 74 86  BILITOT 0.3 0.4  PROT 6.9 6.5  ALBUMIN 2.1* 2.1*   No results for input(s): "LIPASE", "AMYLASE" in the last 168 hours.  No results for input(s): "AMMONIA" in the last 168 hours. Coagulation Profile: No results for input(s): "INR", "PROTIME" in the last 168 hours.  BNP (last 3 results) No results for input(s): "PROBNP" in the last 8760 hours. HbA1C: No results for input(s): "HGBA1C" in the last 72 hours.  CBG: Recent Labs  Lab 01/01/22 0604 01/01/22 1132 01/01/22 1730 01/02/22 0045 01/02/22 0549  GLUCAP 137* 123* 121* 121* 102*   Lipid Profile: Recent Labs    12/31/21 0328  TRIG 57    Thyroid Function Tests: Recent Labs    01/01/22 0335  TSH 5.848*   Sepsis  Labs: No results for input(s): "PROCALCITON", "LATICACIDVEN" in the last 168 hours.   No results found for this or any previous visit (from the past 240 hour(s)).   Antimicrobials: Anti-infectives (From admission, onward)    Start     Dose/Rate Route Frequency Ordered Stop   12/28/21 1030  ceFAZolin (ANCEF) IVPB 2g/100 mL premix        2 g 200 mL/hr over 30 Minutes Intravenous  Once 12/28/21 1021 12/28/21 1156   12/28/21 1019  ceFAZolin (ANCEF) 2-4 GM/100ML-% IVPB       Note to Pharmacy: [place]: cabinet override      12/28/21 1019 12/28/21 1154      Culture/Microbiology    Component Value Date/Time   SDES  12/21/2021 0800    BLOOD RIGHT HAND Performed at [facility], [place] [address], [place], [place] [address]    SPECREQUEST  12/21/2021 0800    BOTTLES DRAWN AEROBIC ONLY Blood Culture adequate volume Performed at [place] [address], [place], [place] [address]    CULT  12/21/2021 0800    NO GROWTH 5 DAYS Performed at [facility], [place] [address], [place],  [address]    REPTSTATUS 12/26/2021 FINAL 12/21/2021 0800    Other culture-see note  Radiology Studies: DG CHEST PORT 1 VIEW  Result Date: 12/31/2021 CLINICAL DATA:  Leukocytosis EXAM: PORTABLE CHEST 1 VIEW COMPARISON:  12/21/2021 FINDINGS: Patient rotated to the right. Nasogastric tube terminates in the herniated stomach with side port likely just below the gastroesophageal junction. No pleural effusion or pneumothorax. Large hiatal hernia with abdominal organs positioned in the lower left hemithorax. No congestive failure. Volume loss in the left lung base.  Clear right lung. Right PICC line terminates at the superior caval/atrial junction. IMPRESSION: Large hiatal hernia. Volume loss within the adjacent left lung base, similar. Nasogastric tube currently positioned within the herniated stomach. Cardiomegaly without congestive failure. Electronically Signed    By: Abigail Miyamoto M.D.   On: 12/31/2021 09:21     LOS: 13 days   Antonieta Pert, MD [facility]  01/02/2022, 9:05 AM

## 2022-01-02 NOTE — Progress Notes (Signed)
Pharmacy called and asked me to stop TPN at @ 8:00 and start D10 due to K+ levels. Orders carried out and K+ recheck to be done at 11.

## 2022-01-02 NOTE — Progress Notes (Signed)
Progress Note: General Surgery Service   Chief Complaint/Subjective: NG out. Patient states she is tolerating CLD but not drinking much because she doesn't want anything. +flatus. Reports a loose liquid stool yesterday.   Objective: Vital signs in last 24 hours: Temp:  [98.3 F (36.8 C)-98.6 F (37 C)] 98.6 F (37 C) (07/05 0627) Pulse Rate:  [102-123] 102 (07/05 0627) Resp:  [16] 16 (07/05 0627) BP: (120-145)/(69-86) 120/69 (07/05 0627) SpO2:  [94 %-98 %] 94 % (07/04 2100) Last BM Date : 01/01/22  Intake/Output from previous day: 07/04 0701 - 07/05 0700 In: 1761.2 [P.O.:540; I.V.:1221.2] Out: 3450 [Urine:3350; Emesis/NG output:100] Intake/Output this shift: Total I/O In: 56.8 [I.V.:56.8] Out: -   GI: Abd Soft, incision c/d/I w/ dressing, nontender.  Lab Results: CBC  Recent Labs    01/01/22 0335 01/02/22 0458  WBC 14.2* 14.1*  HGB 8.4* 9.8*  HCT 23.9* 28.8*  PLT 273 262   BMET Recent Labs    12/31/21 0328 01/02/22 0458  NA 136 132*  K 4.5 5.7*  CL 104 98  CO2 25 24  GLUCOSE 114* 104*  BUN 13 14  CREATININE 0.45 0.50  CALCIUM 8.0* 8.4*   PT/INR No results for input(s): "LABPROT", "INR" in the last 72 hours. ABG No results for input(s): "PHART", "HCO3" in the last 72 hours.  Invalid input(s): "PCO2", "PO2"  Anti-infectives: Anti-infectives (From admission, onward)    Start     Dose/Rate Route Frequency Ordered Stop   12/28/21 1030  ceFAZolin (ANCEF) IVPB 2g/100 mL premix        2 g 200 mL/hr over 30 Minutes Intravenous  Once 12/28/21 1021 12/28/21 1156   12/28/21 1019  ceFAZolin (ANCEF) 2-4 GM/100ML-% IVPB       Note to Pharmacy: Christell Faith L: cabinet override      12/28/21 1019 12/28/21 1154       Medications: Scheduled Meds:  bisacodyl  10 mg Rectal Daily   Chlorhexidine Gluconate Cloth  6 each Topical Daily   feeding supplement  237 mL Oral TID WC   fluticasone  1 spray Each Nare BID   insulin aspart  0-9 Units Subcutaneous Q6H    levothyroxine  100 mcg Intravenous Daily   lip balm   Topical BID   metoCLOPramide (REGLAN) injection  5 mg Intravenous Q6H   metoprolol tartrate  5 mg Intravenous Q6H   pantoprazole (PROTONIX) IV  40 mg Intravenous Q12H   sodium chloride flush  10-40 mL Intracatheter Q12H   Continuous Infusions:  dextrose 80 mL/hr at 01/02/22 0841   dextrose 5 % and 0.45% NaCl 10 mL/hr at 12/29/21 0641   heparin 1,200 Units/hr (01/02/22 0656)   methocarbamol (ROBAXIN) IV     TPN ADULT (ION)     PRN Meds:.acetaminophen, albuterol, alum & mag hydroxide-simeth, azelastine, diphenhydrAMINE, hydrALAZINE, hydrocortisone, lip balm, magic mouthwash, menthol-cetylpyridinium, methocarbamol (ROBAXIN) IV, metoprolol tartrate, morphine injection, ondansetron **OR** ondansetron (ZOFRAN) IV, phenol, polyvinyl alcohol, simethicone, sodium chloride, sodium chloride flush  Assessment/Plan: Ms. Espinal is a 66 year old female who underwent exploratory laparotomy with lysis of adhesions that were causing an adhesive small bowel obstruction on 12/28/21.   - During surgery, a small mesenteric nodule was sent for permanent section - path is pending. - afebrile, VSS, WBC downtrending (14)  - one loose stool reported by patient, BMx2 documented in epic - advance to FLD and continue full support TPN.   FEN: FLD, TPN ID:  VTE: SCD's, hep gtt for hx DVT/hypercoagulable state Foley:  removed 7/3, external cath in place Dispo: med- surg, TRH service.   Hiatal hernia Cardiomyopathy, new acute systolic heart dysfunction - cards following  PMH DVT - acute vs chronic DVT visualized on RLE doppler 12/26/21    LOS: 13 days    Jill Alexanders, [facility] Use [email or web address] to contact on call provider  Daily Billing: [phone] - post op

## 2022-01-02 NOTE — Progress Notes (Signed)
[place] for heparin Indication: history of DVT on rivaroxaban PTA, hypercoagulable state  Allergies  Allergen Reactions   Shellfish-Derived Products Other (See Comments)    ALLERGIC TO SHRIMP!! Made the tongue "feel strange" Per patient, she has since eaten crab legs and did not have another reaction.     Patient Measurements: Height: '5\' 6"'$  (167.6 cm) Weight: 84.1 kg (185 lb 6.5 oz) IBW/kg (Calculated) : 59.3 Heparin Dosing Weight: 77 kg  Vital Signs: Temp: 98.4 F (36.9 C) (07/04 2100) Temp Source: Oral (07/04 2100) BP: 145/86 (07/04 2100) Pulse Rate: 115 (07/04 2100)  Labs: Recent Labs    12/31/21 0328 12/31/21 0806 01/01/22 0335 01/01/22 1600 01/01/22 2036 01/02/22 0458  HGB 8.5*  --  8.4*  --   --  9.8*  HCT 24.2*  --  23.9*  --   --  28.8*  PLT 227  --  273  --   --  262  HEPARINUNFRC  --    < > 0.69 >1.10* 0.70 0.83*  CREATININE 0.45  --   --   --   --  0.50   < > = values in this interval not displayed.     Estimated Creatinine Clearance: 73.5 mL/min (by C-G formula based on SCr of 0.5 mg/dL).   Medications: Rivaroxaban 20 mg PO Daily PTA.  Last dose: 6/21  Assessment: Pt is a 17 yoF admitted with SBO and initially treated with conservative management. PMH significant for DVT in 2018, hypercoagulable state on Xarelto PTA.   Xarelto held on admission, was on enoxaparin for DVT ppx from 6/22 > 6/27. Heparin drip was started on 6/27, but has been intermittently infusing as it was held for possible surgery on 6/28, 6/29, and 6/30 AM.  Patient underwent ex lap and lysis of adhesions 6/30. Heparin drip was held preop and resumed 8 hours postop.    01/02/2022  Heparin level (peripheral stick) = 0.83 units/mL, now above goal range despite decreased rate to 1400 units/hr Confirmed with RN that heparin running at specified rate & lab drawn peripherally/heparin infusing thru central line.  CBC:  Hgb remains low/improved at  9.8, Plt WNL No bleeding or infusion issues reported by RN  Goal of Therapy:  Heparin level 0.3-0.7 units/ml Monitor platelets by anticoagulation protocol: Yes   Plan:  Hold heparin infusion x1 hour then resumed at decreased rate of 1200 units/hr  Recheck heparin level 8h after heparin resumed.   Heparin level, CBC daily  Follow up ability to tolerate enteral meds and change back to home Xarelto when able.    Netta Cedars, PharmD, BCPS Clinical Pharmacist 01/02/2022 5:53 AM

## 2022-01-02 NOTE — Progress Notes (Signed)
[place] for heparin Indication: history of DVT on rivaroxaban PTA, hypercoagulable state  Allergies  Allergen Reactions   Shellfish-Derived Products Other (See Comments)    ALLERGIC TO SHRIMP!! Made the tongue "feel strange" Per patient, she has since eaten crab legs and did not have another reaction.     Patient Measurements: Height: '5\' 6"'$  (167.6 cm) Weight: 84.1 kg (185 lb 6.5 oz) IBW/kg (Calculated) : 59.3 Heparin Dosing Weight: 77 kg  Vital Signs: Temp: 98.4 F (36.9 C) (07/05 1320) Temp Source: Oral (07/05 1320) BP: 112/96 (07/05 1320) Pulse Rate: 109 (07/05 1320)  Labs: Recent Labs    12/31/21 0328 12/31/21 0806 01/01/22 0335 01/01/22 1600 01/02/22 0458 01/02/22 1459 01/02/22 1654  HGB 8.5*  --  8.4*  --  9.8*  --   --   HCT 24.2*  --  23.9*  --  28.8*  --   --   PLT 227  --  273  --  262  --   --   HEPARINUNFRC  --    < > 0.69   < > 0.83* 0.18* 0.33  CREATININE 0.45  --   --   --  0.50  --   --    < > = values in this interval not displayed.     Estimated Creatinine Clearance: 73.5 mL/min (by C-G formula based on SCr of 0.5 mg/dL).   Medications: Rivaroxaban 20 mg PO Daily PTA.  Last dose: 6/21  Assessment: Pt is a 76 yoF admitted with SBO and initially treated with conservative management. PMH significant for DVT in 2018, hypercoagulable state on Xarelto PTA.   Xarelto held on admission, was on enoxaparin for DVT ppx from 6/22 > 6/27. Heparin drip was started on 6/27, but has been intermittently infusing as it was held for possible surgery on 6/28, 6/29, and 6/30 AM.  Patient underwent ex lap and lysis of adhesions 6/30. Heparin drip was held preop and resumed 8 hours postop.   01/02/2022  1500 Heparin level (peripheral stick) now SUBtherapeutic on 1200 units/hr - no infusion issues per RN but lab does note some slight hemolysis; opted to repeat blood draw Repeat heparin level returned at 0.33 with no hemolysis  noted CBC: Hgb remains low/improved at 9.8, Plt WNL No bleeding or infusion issues reported by RN  Goal of Therapy:  Heparin level 0.3-0.7 units/ml Monitor platelets by anticoagulation protocol: Yes   Plan:  Increase heparin slightly to 1250 units/hr Recheck confirmatory level tomorrow AM  Heparin level, CBC daily  Follow up ability to tolerate enteral meds and change back to home Xarelto when able.    Reuel Boom, PharmD, BCPS [phone] 01/02/2022, 5:52 PM

## 2022-01-03 DIAGNOSIS — Z7401 Bed confinement status: Secondary | ICD-10-CM

## 2022-01-03 DIAGNOSIS — E876 Hypokalemia: Secondary | ICD-10-CM

## 2022-01-03 DIAGNOSIS — Z7901 Long term (current) use of anticoagulants: Secondary | ICD-10-CM | POA: Diagnosis not present

## 2022-01-03 DIAGNOSIS — D509 Iron deficiency anemia, unspecified: Secondary | ICD-10-CM | POA: Diagnosis not present

## 2022-01-03 DIAGNOSIS — K56609 Unspecified intestinal obstruction, unspecified as to partial versus complete obstruction: Secondary | ICD-10-CM | POA: Diagnosis not present

## 2022-01-03 DIAGNOSIS — I10 Essential (primary) hypertension: Secondary | ICD-10-CM | POA: Diagnosis not present

## 2022-01-03 DIAGNOSIS — K440 Diaphragmatic hernia with obstruction, without gangrene: Secondary | ICD-10-CM

## 2022-01-03 DIAGNOSIS — N179 Acute kidney failure, unspecified: Secondary | ICD-10-CM

## 2022-01-03 DIAGNOSIS — I255 Ischemic cardiomyopathy: Secondary | ICD-10-CM

## 2022-01-03 DIAGNOSIS — L89153 Pressure ulcer of sacral region, stage 3: Secondary | ICD-10-CM

## 2022-01-03 LAB — CBC WITH DIFFERENTIAL/PLATELET
Abs Immature Granulocytes: 0.22 10*3/uL — ABNORMAL HIGH (ref 0.00–0.07)
Basophils Absolute: 0.1 10*3/uL (ref 0.0–0.1)
Basophils Relative: 0 %
Eosinophils Absolute: 0.3 10*3/uL (ref 0.0–0.5)
Eosinophils Relative: 2 %
HCT: 23.9 % — ABNORMAL LOW (ref 36.0–46.0)
Hemoglobin: 8.3 g/dL — ABNORMAL LOW (ref 12.0–15.0)
Immature Granulocytes: 2 %
Lymphocytes Relative: 9 %
Lymphs Abs: 1.2 10*3/uL (ref 0.7–4.0)
MCH: 26.1 pg (ref 26.0–34.0)
MCHC: 34.7 g/dL (ref 30.0–36.0)
MCV: 75.2 fL — ABNORMAL LOW (ref 80.0–100.0)
Monocytes Absolute: 0.6 10*3/uL (ref 0.1–1.0)
Monocytes Relative: 5 %
Neutro Abs: 11.4 10*3/uL — ABNORMAL HIGH (ref 1.7–7.7)
Neutrophils Relative %: 82 %
Platelets: 300 10*3/uL (ref 150–400)
RBC: 3.18 MIL/uL — ABNORMAL LOW (ref 3.87–5.11)
RDW: 17.3 % — ABNORMAL HIGH (ref 11.5–15.5)
WBC: 13.7 10*3/uL — ABNORMAL HIGH (ref 4.0–10.5)
nRBC: 0 % (ref 0.0–0.2)

## 2022-01-03 LAB — COMPREHENSIVE METABOLIC PANEL
ALT: 39 U/L (ref 0–44)
AST: 45 U/L — ABNORMAL HIGH (ref 15–41)
Albumin: 2.2 g/dL — ABNORMAL LOW (ref 3.5–5.0)
Alkaline Phosphatase: 156 U/L — ABNORMAL HIGH (ref 38–126)
Anion gap: 7 (ref 5–15)
BUN: 14 mg/dL (ref 8–23)
CO2: 27 mmol/L (ref 22–32)
Calcium: 8.2 mg/dL — ABNORMAL LOW (ref 8.9–10.3)
Chloride: 98 mmol/L (ref 98–111)
Creatinine, Ser: 0.45 mg/dL (ref 0.44–1.00)
GFR, Estimated: >60 mL/min (ref >60)
Glucose, Bld: 141 mg/dL — ABNORMAL HIGH (ref 70–99)
Potassium: 4.4 mmol/L (ref 3.5–5.1)
Sodium: 132 mmol/L — ABNORMAL LOW (ref 135–145)
Total Bilirubin: 0.6 mg/dL (ref 0.3–1.2)
Total Protein: 7.3 g/dL (ref 6.5–8.1)

## 2022-01-03 LAB — GLUCOSE, CAPILLARY
Glucose-Capillary: 102 mg/dL — ABNORMAL HIGH (ref 70–99)
Glucose-Capillary: 115 mg/dL — ABNORMAL HIGH (ref 70–99)
Glucose-Capillary: 118 mg/dL — ABNORMAL HIGH (ref 70–99)
Glucose-Capillary: 125 mg/dL — ABNORMAL HIGH (ref 70–99)

## 2022-01-03 LAB — HEPARIN LEVEL (UNFRACTIONATED): Heparin Unfractionated: 0.36 IU/mL (ref 0.30–0.70)

## 2022-01-03 LAB — PHOSPHORUS: Phosphorus: 4.5 mg/dL (ref 2.5–4.6)

## 2022-01-03 LAB — TRIGLYCERIDES: Triglycerides: 65 mg/dL (ref <150)

## 2022-01-03 LAB — MAGNESIUM: Magnesium: 1.9 mg/dL (ref 1.7–2.4)

## 2022-01-03 MED ORDER — TRAVASOL 10 % IV SOLN
INTRAVENOUS | Status: AC
  Filled 2022-01-03: qty 451.2

## 2022-01-03 MED ORDER — ACETAMINOPHEN 500 MG PO TABS
1000.0000 mg | ORAL_TABLET | Freq: Four times a day (QID) | ORAL | Status: DC
  Administered 2022-01-03 – 2022-01-10 (×25): 1000 mg via ORAL
  Filled 2022-01-03 (×28): qty 2

## 2022-01-03 MED ORDER — METHOCARBAMOL 500 MG PO TABS
500.0000 mg | ORAL_TABLET | Freq: Four times a day (QID) | ORAL | Status: DC
  Administered 2022-01-03 – 2022-01-10 (×28): 500 mg via ORAL
  Filled 2022-01-03 (×28): qty 1

## 2022-01-03 MED ORDER — OXYCODONE HCL 5 MG PO TABS
5.0000 mg | ORAL_TABLET | ORAL | Status: DC | PRN
  Administered 2022-01-03 – 2022-01-10 (×16): 5 mg via ORAL
  Filled 2022-01-03 (×16): qty 1

## 2022-01-03 NOTE — Progress Notes (Addendum)
Progress Note  Patient Name: Katrina Donaldson Date of Encounter: 01/03/2022  [facility] Cardiologist: Skeet Latch, MD   Subjective   Starting to take in a little liquids, no CP or SOB  Inpatient Medications    Scheduled Meds:  bisacodyl  10 mg Rectal Daily   Chlorhexidine Gluconate Cloth  6 each Topical Daily   feeding supplement  237 mL Oral TID WC   fluticasone  1 spray Each Nare BID   insulin aspart  0-9 Units Subcutaneous Q6H   levothyroxine  100 mcg Intravenous Daily   lip balm   Topical BID   metoprolol tartrate  5 mg Intravenous Q6H   pantoprazole (PROTONIX) IV  40 mg Intravenous Q12H   sodium chloride flush  10-40 mL Intracatheter Q12H   Continuous Infusions:  dextrose 5 % and 0.45% NaCl 10 mL/hr at 01/03/22 0332   heparin 1,200 Units/hr (01/03/22 0332)   methocarbamol (ROBAXIN) IV     TPN ADULT (ION) 80 mL/hr at 01/03/22 0332   PRN Meds: acetaminophen, albuterol, alum & mag hydroxide-simeth, azelastine, diphenhydrAMINE, hydrALAZINE, hydrocortisone, lip balm, magic mouthwash, menthol-cetylpyridinium, methocarbamol (ROBAXIN) IV, metoprolol tartrate, morphine injection, ondansetron **OR** ondansetron (ZOFRAN) IV, phenol, polyvinyl alcohol, simethicone, sodium chloride, sodium chloride flush   Vital Signs    Vitals:   01/02/22 2015 01/02/22 2020 01/03/22 0538 01/03/22 0540  BP: 104/71  115/73   Pulse: (!) 111  (!) 115   Resp:  20  20  Temp: 98.8 F (37.1 C)  (!) 97.5 F (36.4 C)   TempSrc: Oral  Oral   SpO2: 100%  100% 100%  Weight:      Height:        Intake/Output Summary (Last 24 hours) at 01/03/2022 0757 Last data filed at 01/03/2022 0540 Gross per 24 hour  Intake 1921.19 ml  Output 1825 ml  Net 96.19 ml      12/28/2021    9:49 AM 12/26/2021   12:30 PM 12/23/2021    6:00 AM  Last 3 Weights  Weight (lbs) 185 lb 6.5 oz 185 lb 6.5 oz 177 lb 4 oz  Weight (kg) 84.1 kg 84.1 kg 80.4 kg      Telemetry    Sinus tach, 95-120 - Personally  Reviewed  ECG    Sinus tachycardia 116 nonspecific T wave changes- Personally Reviewed  Physical Exam   General: Well developed, well nourished, female in no acute distress Head: Head normocephalic and atraumatic Lungs: clear bilaterally to auscultation. Heart: HRRR S1 S2, without rub or gallop. No murmur. 4/4 extremity pulses are 2+ & equal. No JVD. Abdomen: Bowel sounds are present, abdomen soft and non-tender without masses or  hernias noted. Msk: strength and tone not tested, has R hemiplegia Extremities: No clubbing, cyanosis or edema.    Skin:  No rashes or lesions noted. Neuro: Alert and oriented X 3. Psych:  Good affect, responds appropriately  Labs    High Sensitivity Troponin:   Recent Labs  Lab 12/26/21 1658  TROPONINIHS 12     Chemistry Recent Labs  Lab 12/31/21 0328 01/02/22 0458 01/02/22 1219 01/03/22 0452  NA 136 132*  --  132*  K 4.5 5.7* 4.8 4.4  CL 104 98  --  98  CO2 25 24  --  27  GLUCOSE 114* 104*  --  141*  BUN 13 14  --  14  CREATININE 0.45 0.50  --  0.45  CALCIUM 8.0* 8.4*  --  8.2*  MG 1.9 2.1  --  1.9  PROT 6.5  --   --  7.3  ALBUMIN 2.1*  --   --  2.2*  AST 18  --   --  45*  ALT 12  --   --  39  ALKPHOS 86  --   --  156*  BILITOT 0.4  --   --  0.6  GFRNONAA >60 >60  --  >60  ANIONGAP 7 10  --  7    Lipids  Recent Labs  Lab 01/03/22 0452  TRIG 65    Hematology Recent Labs  Lab 01/01/22 0335 01/02/22 0458 01/03/22 0452  WBC 14.2* 14.1* 13.7*  RBC 3.23* 3.76* 3.18*  HGB 8.4* 9.8* 8.3*  HCT 23.9* 28.8* 23.9*  MCV 74.0* 76.6* 75.2*  MCH 26.0 26.1 26.1  MCHC 35.1 34.0 34.7  RDW 17.2* 18.0* 17.3*  PLT 273 262 300   Thyroid  Recent Labs  Lab 01/01/22 0335  TSH 5.848*    BNPNo results for input(s): "BNP", "PROBNP" in the last 168 hours.  DDimer No results for input(s): "DDIMER" in the last 168 hours.   Radiology    No results found.  Cardiac Studies   ECHO: 12/26/2021  1. Left ventricular ejection fraction, by  estimation, is 30 to 35%. The  left ventricle has moderately decreased function. The left ventricle  demonstrates global hypokinesis. Left ventricular diastolic parameters are  consistent with Grade I diastolic dysfunction (impaired relaxation).   2. Right ventricular systolic function is normal. The right ventricular  size is normal.   3. The mitral valve is normal in structure. No evidence of mitral valve  regurgitation. No evidence of mitral stenosis.   4. The aortic valve is calcified. There is mild calcification of the  aortic valve. There is mild thickening of the aortic valve. Aortic valve  regurgitation is not visualized. Aortic valve sclerosis is present, with  no evidence of aortic valve stenosis.   5. The inferior vena cava is normal in size with greater than 50%  respiratory variability, suggesting right atrial pressure of 3 mmHg.    [place] LOWER EXTREMITY - DVT STUDY 12/26/2021 Comparison Study: 07/25/2016 - - Findings consistent with acute deep vein  thrombosis involving the right common femoral vein, right profunda femoris vein, right femoral vein, right popliteal vein, right posterial  tibial vein, and right peroneal vein.   Patient Profile     66 y.o. female post partial colectomy with high-grade small bowel obstruction large hiatal hernia echocardiogram with a EF of 30%, new, with tachycardia.  Underwent lysis of adhesions on 6/30.  Assessment & Plan    Cardiomyopathy, EF 30% new from 2016 - Possibly secondary to tachycardia. - On metoprolol 5 mg every 6 hours IV - We will wait on other goal-directed medical therapy when she is able to take p.o. medication.  Sinus tachycardia - Multifactorial from underlying cardiomyopathy as well as current metabolic condition.  Acute illness. -  Continue with IV metoprolol as above.  Acute right-sided DVT lower extremity. - Currently on heparin IV. - Transition to p.o. when able.  Hypothyroidism - On Synthroid.  TSH currently  5.8.  Not hyperthyroid.  Small bowel obstruction status post lysis of adhesions - Surgical team on board.  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   For questions or updates, please contact [place] Please consult [email or web address] for contact info under  Signed, Rosaria Ferries, PA-C  01/03/2022, 7:57 AM   I have seen and examined the patient along with Rosaria Ferries, PA-C .  I have reviewed the chart, notes and new data.  I agree with PA/NP's note.    PLAN: No overt HF. Making some progress with GI issues, but PO intake is not yet reliable.  Sanda Klein, MD, [place] [phone] 01/03/2022, 10:41 AM

## 2022-01-03 NOTE — Progress Notes (Signed)
Progress Note: General Surgery Service   Chief Complaint/Subjective: Denies abd pain. C/o pain over decubitus ulcers. Tolerating PO but not eating much due to poor appetite. Had a BM yesterday. +flatus.  Objective: Vital signs in last 24 hours: Temp:  [97.5 F (36.4 C)-98.8 F (37.1 C)] 97.5 F (36.4 C) (07/06 0538) Pulse Rate:  [109-115] 115 (07/06 0538) Resp:  [16-20] 20 (07/06 0540) BP: (104-115)/(71-96) 115/73 (07/06 0538) SpO2:  [100 %] 100 % (07/06 0540) Last BM Date : 01/01/22  Intake/Output from previous day: 07/05 0701 - 07/06 0700 In: 1921.2 [P.O.:60; I.V.:1861.2] Out: 1825 [Urine:1825] Intake/Output this shift: No intake/output data recorded.  GI: Abd Soft, incision c/d/I w/ dressing, nontender.  Lab Results: CBC  Recent Labs    01/02/22 0458 01/03/22 0452  WBC 14.1* 13.7*  HGB 9.8* 8.3*  HCT 28.8* 23.9*  PLT 262 300   BMET Recent Labs    01/02/22 0458 01/02/22 1219 01/03/22 0452  NA 132*  --  132*  K 5.7* 4.8 4.4  CL 98  --  98  CO2 24  --  27  GLUCOSE 104*  --  141*  BUN 14  --  14  CREATININE 0.50  --  0.45  CALCIUM 8.4*  --  8.2*   PT/INR No results for input(s): "LABPROT", "INR" in the last 72 hours. ABG No results for input(s): "PHART", "HCO3" in the last 72 hours.  Invalid input(s): "PCO2", "PO2"  Anti-infectives: Anti-infectives (From admission, onward)    Start     Dose/Rate Route Frequency Ordered Stop   12/28/21 1030  ceFAZolin (ANCEF) IVPB 2g/100 mL premix        2 g 200 mL/hr over 30 Minutes Intravenous  Once 12/28/21 1021 12/28/21 1156   12/28/21 1019  ceFAZolin (ANCEF) 2-4 GM/100ML-% IVPB       Note to Pharmacy: Christell Faith L: cabinet override      12/28/21 1019 12/28/21 1154       Medications: Scheduled Meds:  acetaminophen  1,000 mg Oral Q6H   bisacodyl  10 mg Rectal Daily   Chlorhexidine Gluconate Cloth  6 each Topical Daily   feeding supplement  237 mL Oral TID WC   fluticasone  1 spray Each Nare BID    insulin aspart  0-9 Units Subcutaneous Q6H   levothyroxine  100 mcg Intravenous Daily   lip balm   Topical BID   methocarbamol  500 mg Oral Q6H   metoprolol tartrate  5 mg Intravenous Q6H   pantoprazole (PROTONIX) IV  40 mg Intravenous Q12H   sodium chloride flush  10-40 mL Intracatheter Q12H   Continuous Infusions:  dextrose 5 % and 0.45% NaCl 10 mL/hr at 01/03/22 8502   heparin 1,200 Units/hr (01/03/22 0332)   TPN ADULT (ION) 80 mL/hr at 01/03/22 0332   PRN Meds:.albuterol, alum & mag hydroxide-simeth, azelastine, diphenhydrAMINE, hydrALAZINE, hydrocortisone, lip balm, magic mouthwash, menthol-cetylpyridinium, metoprolol tartrate, morphine injection, ondansetron **OR** ondansetron (ZOFRAN) IV, oxyCODONE, phenol, polyvinyl alcohol, simethicone, sodium chloride, sodium chloride flush  Assessment/Plan: Katrina Donaldson is a 66 year old female who underwent exploratory laparotomy with lysis of adhesions that were causing an adhesive small bowel obstruction on 12/28/21.   - During surgery, a small mesenteric nodule was sent for permanent section - path is pending. - afebrile, VSS, WBC downtrending (13.7) - advance to soft diet, I would recommend decreasing TPN support tonight. Encourage PO intake. Defer initiation of appetite stimulant to medical service.  FEN: soft, 1/2 TPN per pharmacy. ID:  none VTE: SCD's, hep gtt for hx DVT/hypercoagulable state Foley: removed 7/3, external cath in place Dispo: med- surg, TRH service.   Hiatal hernia Cardiomyopathy, new acute systolic heart dysfunction - cards following  PMH DVT - acute vs chronic DVT visualized on RLE doppler 12/26/21    LOS: 14 days    Jill Alexanders, [facility] Use [email or web address] to contact on call provider  Daily Billing: [phone] - post op

## 2022-01-03 NOTE — Progress Notes (Signed)
[place] for heparin Indication: history of DVT on rivaroxaban PTA, hypercoagulable state  Allergies  Allergen Reactions   Shellfish-Derived Products Other (See Comments)    ALLERGIC TO SHRIMP!! Made the tongue "feel strange" Per patient, she has since eaten crab legs and did not have another reaction.     Patient Measurements: Height: '5\' 6"'$  (167.6 cm) Weight: 84.1 kg (185 lb 6.5 oz) IBW/kg (Calculated) : 59.3 Heparin Dosing Weight: 77 kg  Vital Signs: Temp: 97.5 F (36.4 C) (07/06 0538) Temp Source: Oral (07/06 0538) BP: 115/73 (07/06 0538) Pulse Rate: 115 (07/06 0538)  Labs: Recent Labs    01/01/22 0335 01/01/22 1600 01/02/22 0458 01/02/22 1459 01/02/22 1654 01/03/22 0452  HGB 8.4*  --  9.8*  --   --  8.3*  HCT 23.9*  --  28.8*  --   --  23.9*  PLT 273  --  262  --   --  300  HEPARINUNFRC 0.69   < > 0.83* 0.18* 0.33 0.36  CREATININE  --   --  0.50  --   --  0.45   < > = values in this interval not displayed.     Estimated Creatinine Clearance: 73.5 mL/min (by C-G formula based on SCr of 0.45 mg/dL).   Medications: Rivaroxaban 20 mg PO Daily PTA.  Last dose: 6/21  Assessment: Pt is a 44 yoF admitted with SBO and initially treated with conservative management. PMH significant for DVT in 2018, hypercoagulable state on Xarelto PTA.   Xarelto held on admission, was on enoxaparin for DVT ppx from 6/22 > 6/27. Heparin drip was started on 6/27, but has been intermittently infusing as it was held for possible surgery on 6/28, 6/29, and 6/30 AM.  Patient underwent ex lap and lysis of adhesions 6/30. Heparin drip was held preop and resumed 8 hours postop.   01/03/2022  AM heparin level = 0.36 units/mL, therapeutic Heparin infusing through PICC line at 1200 units/hr per RN report  CBC: Hgb low/decreased to 8.3, Plt WNL No bleeding or infusion related issues reported by nursing  Goal of Therapy:  Heparin level 0.3-0.7  units/ml Monitor platelets by anticoagulation protocol: Yes   Plan:  Continue heparin infusion at 1200 units/hr Heparin level, CBC daily  Follow up ability to tolerate enteral meds and change back to home Xarelto when able.     Lindell Spar, PharmD, BCPS [phone] 01/03/2022, 7:26 AM

## 2022-01-03 NOTE — Progress Notes (Addendum)
PHARMACY - TOTAL PARENTERAL NUTRITION CONSULT NOTE   Indication: Small bowel obstruction  Patient Measurements: Height: '5\' 6"'  (167.6 cm) Weight: 84.1 kg (185 lb 6.5 oz) IBW/kg (Calculated) : 59.3 TPN AdjBW (KG): 65.5 Body mass index is 29.93 kg/m.  Assessment:  66 yo F presents with abdominal pain, n/v, and poor appetite x 5 days. 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 No BM for 5 days as well. NG output was 1.6 L and xray shows SBO. Does report shellfish allergy but not any fish. Reacted to shrimp but not crab and was not an anaphylactic reaction. Pharmacy consulted for TPN.  Glucose / Insulin: No hx of DM. CBGs <180. 0 units SSI/ 24 hrs. - 6/30 Dexamethasone 67m  Electrolytes: Na 132 low, K and Phos improved, others WNL  Renal: SCr WNL.   Hepatic:  AST slightly elevated. ALT WNL. Alk Phos elevated/increased to 156. Tbili WNL. TG WNL (65). Albumin low at 2.2. Intake / Output; MIVF: I/O 1921/1825, Stool x 1 - PO Intake 651m(FLD) - D5 1/2NS at [facility] Procedures:  6/30 Exploratory laparotomy and lysis of adhesions.   Central access: PICC placed 6/29 TPN start date: 6/29  Nutritional Goals: Goal TPN rate is 80 mL/hr (provides 90 g of protein and 2,085 kcals per day)  RD Assessment: Estimated Needs Total Energy Estimated Needs: 1950-2150 Total Protein Estimated Needs: 85-95g Total Fluid Estimated Needs: 2L/day  Current Nutrition:  TPN  Full liquids > soft diet Ensure Enlive ordered TID (accepted 1 on 7/5)  Plan:  At 18:00:  Decrease TPN to half rate of 4079mr per d/w CCS Electrolytes in TPN: Na 100m44m, K 50mE56m Ca 5mEq/27mMg 7mEq/L43mnd Phos 10mmol/88ml:Ac 1:1 Add standard MVI and trace elements to TPN Continue Sensitive q6h SSI and adjust as needed  Continue D51/2NS at [facility] TPN labs on Mon/Thurs CMET, Mg, Phos in AM F/U ability to further wean/discontinue TPN as diet advanced    Artia Singley GaLindell Spar,  BCPS Clinical Pharmacist 01/03/2022 8:14 AM

## 2022-01-03 NOTE — Progress Notes (Signed)
PROGRESS NOTE    Hilary Milks  OQH:[id number] DOB: [birth year] DOA: 12/20/2021 PCP: Donald Prose, MD   Brief Narrative:  66 y.o.f w/ history significant of laparoscopic partial colectomy in 01/2021 2/2 cecal mass, thyroidectomy, brain aneurysm, chronic anticoagulation, hypertension, hypothyroidism, CVA, sacral osteomyelitis, wheelchair-bound presented from SNF complaining of abdominal pain, nausea, vomiting, poor appetite  x 5 days, last BM 5 days PTA. In the ED, vital signs noted for tachycardia otherwise fairly stable, labs noted for mild anemia, otherwise stable.  CT abdomen/pelvis showed high-grade SBO likely due to adhesions, large hiatal hernia.  Surgery consulted and patient admitted for further management  Patient has failed to progress despite having NG tube decompression IV fluid hydration. She was noted to have tachycardia abnormal EKG, no chest pain troponins are negative, EKG shows sinus tachycardia. Echocardiogram showed decreased EF 30-35%. Labs with improved/stable renal function, stable hemoglobin at 10.2 g.  Seen by cardiology.  Sbo did not improve subsequently underwent ex laparotomy with lysis of adhesion by Dr Barry Dienes 6/30.patient continued on NG decompression and clamping trial-ng fell off 7/4-keeping it off.  Awaiting return of bowel function   Assessment & Plan:   Principal Problem:   SBO (small bowel obstruction) (HCC) Active Problems:   Essential hypertension   Anemia, iron deficiency   Hypothyroidism   Hypercoagulable state ([place])   Hypokalemia   Confusion   Chronic anticoagulation   Cecum mass   Sacral osteomyelitis (HCC)   Pressure injury of sacral region, stage 4 (HCC)   AKI (acute kidney injury) ([place])   Pressure ulcer of sacral region, stage 3 ([place])   Cardiomyopathy ([place])   Bedridden   Incarcerated hiatal hernia   SBO secondary to adhesions: s/p ex laparotomy with lysis of adhesion by Dr Barry Dienes 6/30, after it did not improve on conservative  management.continue TPN-although at reduced arte as diet advances Continue plan of care as per surgical service Continue pain control DVT prophylaxis and ambulation PT OT.  Follow-up pathology on Biopsy from mesenteric nodule.     Large hiatal hernia: on PPI twice daily Leukocytosis postop: WBC count now downtrending remains afebrile, no UTI.  Foley off.  Monitor Hypernatremia w/ Hyperchloremia:Resolved  Hypokalemia resolved  Hyperkalemia-resolved 4.4   Cardiomyopathy w/ new acute systolic dysfunction with EF 30 to 35% Essential hypertension Sinus tachycardia: Likely tachycardia mediated acute systolic dysfunction in the setting of acute illness.  Cardiology following, on metoprolol 5 mg scheduled/prn, monitor in tele.She will need repeat echo once acute illness resolves and outpatient CHF follow-up.  CHF is compensated.  Continue to monitor intake output, watch for fluid overload- noted some wt changes-if symptomatic add lasix, f/up Cardio recs today if anything needs to be adjusted for tachycardia .Net IO Since Admission: 5,665.8 mL [01/02/22 0905]       Filed Weights    12/23/21 0600 12/26/21 1230 12/28/21 0949  Weight: 80.4 kg 84.1 kg 84.1 kg    Normocytic anemia Iron deficiency Anemia of chronic disease:  Hb trending up slightly.Transfuse if less than 7 g.  Last Labs          Recent Labs  Lab 12/29/21 0308 12/30/21 0303 12/31/21 0328 01/01/22 0335 01/02/22 0458  HGB 9.7* 8.7* 8.5* 8.4* 9.8*  HCT 27.3* 24.6* 24.2* 23.9* 28.8*    GERD:cont IV PPI. TWS:FKCLEX on hold. Fever-several days ago, no recurrence/resolved Acute urine retention-resolved. Off foley 7/3 Hypothyroidism: cont IV Synthroid NTZ:GYFVCBSW. Hypophosphatemia resolved.monitor.   History of DVT starting in 2005//Hypercoagulable state Chronic DVT ON [place] 6/28: Xarelto  on hold-remains on heparin drip-dosing/monitoring per pharmacy   Goals of care full code, prognosis remains to be seen given patient's acute  multiple medical problems including bowel obstruction and acute systolic dysfunction.   Stage III pressure ulcer on sacrum POA: Continue wound care and offloading. Ext foley     Pressure Injury 12/20/21 Sacrum Posterior Stage 3 -  Full thickness tissue loss. Subcutaneous fat may be visible but bone, tendon or muscle are NOT exposed. Red (Active)  12/20/21 1950  Location:Sacrum  Location Orientation:Posterior  Staging:Stage 3-Full thickness tissue loss. Subcutaneous fat may be visible but bone, tendon or muscle are NOT exposed.  Wound Description (Comments):Red  Present on Admission:Yes  Dressing Type Foam:Lift dressing to assess site every shift. 12/26/21 1008  DVT prophylaxis: SCDs Start: 12/20/21 1811 Code Status:   Code Status: Full Code Family Communication: tried calling daughter, n/a, left voicemail      Code Status Orders  (From admission, onward)           Start     Ordered   12/20/21 1811  Full code  Continuous        12/20/21 1810           Code Status History     Date Active Date Inactive Code Status Order ID Comments User Context   01/26/2021 2309 02/14/2021 0818 Full Code [id number]  Kayleen Memos, DO ED   06/03/2017 2108 06/05/2017 2129 Full Code [id number]  Consuella Lose, MD Inpatient   07/24/2016 1847 08/24/2016 1901 Full Code [id number]  Cathlyn Parsons, PA-C Inpatient   07/24/2016 1847 07/24/2016 1847 Full Code [id number]  Cathlyn Parsons, PA-C Inpatient   07/19/2016 2034 07/24/2016 1835 Full Code [id number]  Consuella Lose, MD Inpatient   07/21/2015 1026 07/22/2015 0326 Full Code [id number]  Consuella Lose, MD HOV      Advance Directive Documentation    Flowsheet Row Most Recent Value  Type of Advance Directive Healthcare Power of [place]  Pre-existing out of facility DNR order (yellow form or pink MOST form) --  "MOST" Form in Place? --      Family Communication: as above  Disposition Plan:    Patient will remain inpatient on TPN not yet  ready to be discharged Consults called: None Admission status: Inpatient   Consultants:  Gen surgery  Procedures:  DG CHEST PORT 1 VIEW  Result Date: 12/31/2021 CLINICAL DATA:  Leukocytosis EXAM: PORTABLE CHEST 1 VIEW COMPARISON:  12/21/2021 FINDINGS: Patient rotated to the right. Nasogastric tube terminates in the herniated stomach with side port likely just below the gastroesophageal junction. No pleural effusion or pneumothorax. Large hiatal hernia with abdominal organs positioned in the lower left hemithorax. No congestive failure. Volume loss in the left lung base. Clear right lung. Right PICC line terminates at the superior caval/atrial junction. IMPRESSION: Large hiatal hernia. Volume loss within the adjacent left lung base, similar. Nasogastric tube currently positioned within the herniated stomach. Cardiomegaly without congestive failure. Electronically Signed   By: Abigail Miyamoto M.D.   On: 12/31/2021 09:21   VAS [place] LOWER EXTREMITY VENOUS (DVT)  Result Date: 12/26/2021  Lower Venous DVT Study Patient Name:  SHEROLYN TRETTIN  Date of Exam:   12/26/2021 Medical Rec #: [id number]        Accession #:    [id number] Date of Birth: 24-Jun-1956        Patient Gender: F Patient Age:   35 years Exam Location:  [facility] Procedure:      VAS  [place] LOWER EXTREMITY VENOUS (DVT) Referring Phys: [place] --------------------------------------------------------------------------------  Indications: Swelling.  Risk Factors: DVT. Limitations: Poor ultrasound/tissue interface and patient positioning, patient immobility, patient pain tolerance. Comparison Study: 07/25/2016 - - Findings consistent with acute deep vein                   thrombosis involving the                   right common femoral vein, right profunda femoris vein, right                   femoral vein, right popliteal vein, right posterial tibial                   vein,                   and right peroneal vein.                   - Findings  consistent with acute superficial vein thrombosis                   involving the right small saphenous vein.                   - No evidence of deep vein thrombosis involving the left lower                   extremity.                   - No evidence of Baker&'s cyst on the right or left. Performing Technologist: Oliver Hum RVT  Examination Guidelines: A complete evaluation includes B-mode imaging, spectral Doppler, color Doppler, and power Doppler as needed of all accessible portions of each vessel. Bilateral testing is considered an integral part of a complete examination. Limited examinations for reoccurring indications may be performed as noted. The reflux portion of the exam is performed with the patient in reverse Trendelenburg.  +---------+---------------+---------+-----------+----------+--------------+ RIGHT    CompressibilityPhasicitySpontaneityPropertiesThrombus Aging +---------+---------------+---------+-----------+----------+--------------+ CFV      Partial        Yes      Yes                  Chronic        +---------+---------------+---------+-----------+----------+--------------+ SFJ      Full                                                        +---------+---------------+---------+-----------+----------+--------------+ FV Prox  Full                                                        +---------+---------------+---------+-----------+----------+--------------+ FV Mid                  Yes      Yes                                 +---------+---------------+---------+-----------+----------+--------------+ FV DistalFull                                                        +---------+---------------+---------+-----------+----------+--------------+  PFV      Full                                                        +---------+---------------+---------+-----------+----------+--------------+ POP      Full           Yes      Yes                                  +---------+---------------+---------+-----------+----------+--------------+ PTV      Full                                                        +---------+---------------+---------+-----------+----------+--------------+ PERO     Full                                                        +---------+---------------+---------+-----------+----------+--------------+   +---------+---------------+---------+-----------+----------+-------------------+ LEFT     CompressibilityPhasicitySpontaneityPropertiesThrombus Aging      +---------+---------------+---------+-----------+----------+-------------------+ CFV      Full           Yes      Yes                                      +---------+---------------+---------+-----------+----------+-------------------+ SFJ      Full                                                             +---------+---------------+---------+-----------+----------+-------------------+ FV Prox  Full                                                             +---------+---------------+---------+-----------+----------+-------------------+ FV Mid   Full                                                             +---------+---------------+---------+-----------+----------+-------------------+ FV Distal               Yes      Yes                                      +---------+---------------+---------+-----------+----------+-------------------+ PFV      Full                                                             +---------+---------------+---------+-----------+----------+-------------------+  POP      Full           Yes      Yes                                      +---------+---------------+---------+-----------+----------+-------------------+ PTV      Full                                                             +---------+---------------+---------+-----------+----------+-------------------+ PERO                                                   Not well visualized +---------+---------------+---------+-----------+----------+-------------------+     Summary: RIGHT: - 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 - No cystic structure found in the popliteal fossa.  LEFT: - There is no evidence of deep vein thrombosis in the lower extremity. However, portions of this examination were limited- see technologist comments above.  - No cystic structure found in the popliteal fossa.  *See table(s) above for measurements and observations. Electronically signed by Orlie Pollen on 12/26/2021 at 5:31:11 PM.    Final    ECHOCARDIOGRAM COMPLETE  Result Date: 12/26/2021    ECHOCARDIOGRAM REPORT   Patient Name:   GAGANDEEP KOSSMAN Date of Exam: 12/26/2021 Medical Rec #:  [id number]       Height:       66.0 in Accession #:    [id number]      Weight:       177.2 lb Date of Birth:  1956/01/11       BSA:          1.900 m Patient Age:    43 years        BP:           125/82 mmHg Patient Gender: F               HR:           120 bpm. Exam Location:  Inpatient Procedure: 2D Echo, Color Doppler and Cardiac Doppler Indications:    Abnormal ekg  History:        Patient has no prior history of Echocardiogram examinations.  Sonographer:    Joette Catching RCS Referring Phys: [id number] [place]  Sonographer Comments: Technically challenging study due to limited acoustic windows. Supine scan. IMPRESSIONS  1. Left ventricular ejection fraction, by estimation, is 30 to 35%. The left ventricle has moderately decreased function. The left ventricle demonstrates global hypokinesis. Left ventricular diastolic parameters are consistent with Grade I diastolic dysfunction (impaired relaxation).  2. Right ventricular systolic function is normal. The right ventricular size is normal.  3. The mitral valve is normal in structure. No evidence of mitral valve regurgitation. No evidence of mitral stenosis.   4. The aortic valve is calcified. There is mild calcification of the aortic valve. There is mild thickening of the aortic valve. Aortic valve regurgitation is not visualized. Aortic valve sclerosis is present, with no evidence of aortic valve stenosis.  5. The inferior vena cava  is normal in size with greater than 50% respiratory variability, suggesting right atrial pressure of 3 mmHg. FINDINGS  Left Ventricle: Left ventricular ejection fraction, by estimation, is 30 to 35%. The left ventricle has moderately decreased function. The left ventricle demonstrates global hypokinesis. The left ventricular internal cavity size was normal in size. There is no left ventricular hypertrophy. Abnormal (paradoxical) septal motion, consistent with left bundle branch block. Left ventricular diastolic parameters are consistent with Grade I diastolic dysfunction (impaired relaxation). Right Ventricle: The right ventricular size is normal. No increase in right ventricular wall thickness. Right ventricular systolic function is normal. Left Atrium: Left atrial size was normal in size. Right Atrium: Right atrial size was normal in size. Pericardium: There is no evidence of pericardial effusion. Mitral Valve: The mitral valve is normal in structure. No evidence of mitral valve regurgitation. No evidence of mitral valve stenosis. Tricuspid Valve: The tricuspid valve is normal in structure. Tricuspid valve regurgitation is not demonstrated. No evidence of tricuspid stenosis. Aortic Valve: The aortic valve is calcified. There is mild calcification of the aortic valve. There is mild thickening of the aortic valve. Aortic valve regurgitation is not visualized. Aortic valve sclerosis is present, with no evidence of aortic valve stenosis. Aortic valve mean gradient measures 4.0 mmHg. Aortic valve peak gradient measures 6.2 mmHg. Aortic valve area, by VTI measures 2.26 cm. Pulmonic Valve: The pulmonic valve was normal in structure. Pulmonic  valve regurgitation is not visualized. No evidence of pulmonic stenosis. Aorta: The aortic root is normal in size and structure. Venous: The inferior vena cava is normal in size with greater than 50% respiratory variability, suggesting right atrial pressure of 3 mmHg. IAS/Shunts: No atrial level shunt detected by color flow Doppler.  LEFT VENTRICLE PLAX 2D LVIDd:         3.80 cm   Diastology LVIDs:         3.30 cm   LV e' medial:    5.22 cm/s LV PW:         1.00 cm   LV E/e' medial:  10.0 LV IVS:        0.90 cm   LV e' lateral:   5.77 cm/s LVOT diam:     2.30 cm   LV E/e' lateral: 9.1 LV SV:         30 LV SV Index:   16 LVOT Area:     4.15 cm  RIGHT VENTRICLE             IVC RV S prime:     29.70 cm/s  IVC diam: 1.70 cm TAPSE (M-mode): 2.1 cm LEFT ATRIUM             Index LA diam:        2.10 cm 1.11 cm/m LA Vol (A2C):   20.0 ml 10.53 ml/m LA Vol (A4C):   57.6 ml 30.32 ml/m LA Biplane Vol: 35.8 ml 18.84 ml/m  AORTIC VALVE                    PULMONIC VALVE AV Area (Vmax):    2.08 cm     PV Vmax:          1.15 m/s AV Area (Vmean):   2.10 cm     PV Peak grad:     5.3 mmHg AV Area (VTI):     2.26 cm     PR End Diast Vel: 10.76 msec AV Vmax:  125.00 cm/s AV Vmean:          94.000 cm/s AV VTI:            0.134 m AV Peak Grad:      6.2 mmHg AV Mean Grad:      4.0 mmHg LVOT Vmax:         62.70 cm/s LVOT Vmean:        47.600 cm/s LVOT VTI:          0.073 m LVOT/AV VTI ratio: 0.54  AORTA Ao Root diam: 3.40 cm Ao Asc diam:  3.30 cm MITRAL VALVE               TRICUSPID VALVE MV Area (PHT): 5.84 cm    TR Peak grad:   25.0 mmHg MV Decel Time: 130 msec    TR Vmax:        250.00 cm/s MV E velocity: 52.30 cm/s MV A velocity: 78.40 cm/s  SHUNTS MV E/A ratio:  0.67        Systemic VTI:  0.07 m                            Systemic Diam: 2.30 cm Candee Furbish MD Electronically signed by Candee Furbish MD Signature Date/Time: 12/26/2021/5:05:24 PM    Final    [place] EKG SITE RITE  Result Date: 12/26/2021 If Site Rite image not  attached, placement could not be confirmed due to current cardiac rhythm.  DG Abd Portable 1V  Result Date: 12/26/2021 CLINICAL DATA:  Small-bowel obstruction EXAM: PORTABLE ABDOMEN - 1 VIEW COMPARISON:  12/25/2021 FINDINGS: Persistent dilated loops of small bowel particularly in the left abdomen. Maximum measurable distension is decreased. Calcified uterine fibroid is again noted. IMPRESSION: Persistent small bowel obstruction. Maximum distension measures decreased. Electronically Signed   By: Macy Mis M.D.   On: 12/26/2021 08:24   DG Abd Portable 1V  Result Date: 12/25/2021 CLINICAL DATA:  Small bowel obstruction. EXAM: PORTABLE ABDOMEN - 1 VIEW COMPARISON:  Abdominal x-ray from yesterday. FINDINGS: Enteric tube incompletely visualized looped in the left chest, presumably within the large hiatal hernia as seen on CT. Multiple dilated loops of small bowel are unchanged. Calcified uterine fibroid again noted. No acute osseous abnormality. IMPRESSION: 1. Unchanged small bowel obstruction. Electronically Signed   By: Titus Dubin M.D.   On: 12/25/2021 10:31   DG Abd 1 View  Result Date: 12/24/2021 CLINICAL DATA:  None hour delayed film.  Small bowel obstruction. EXAM: ABDOMEN - 1 VIEW COMPARISON:  12/23/2021 FINDINGS: Continued small bowel dilatation compatible with small bowel obstruction. Likely not significantly changed. No visible free air. Large calcified fibroid in the right side of the pelvis. NG tube is not visualized. This likely projects off the superior aspect of the image as the upper abdomen is not included on this single image. IMPRESSION: Continued small bowel obstruction pattern, not significantly changed. Electronically Signed   By: Rolm Baptise M.D.   On: 12/24/2021 23:14   DG Abd Portable 1V  Result Date: 12/23/2021 CLINICAL DATA:  Nasogastric tube placement. Small-bowel obstruction. EXAM: PORTABLE ABDOMEN - 1 VIEW COMPARISON:  Prior today FINDINGS: A nasogastric tube is now  seen which appears coiled within a hiatal hernia. Moderately dilated small bowel loops show mild decrease in dilatation since prior exam. IMPRESSION: Nasogastric tube appears coiled within a hiatal hernia. Mildly decreased dilatation of small bowel loops since prior exam. Electronically Signed   By:  Marlaine Hind M.D.   On: 12/23/2021 16:27   DG Abd Portable 1V-Small Bowel Obstruction Protocol-24 hr delay  Result Date: 12/23/2021 CLINICAL DATA:  Small bowel obstruction protocol, 24 hour delayed image. EXAM: PORTABLE ABDOMEN - 1 VIEW COMPARISON:  Abdominal x-rays 12/22/2021. FINDINGS: Contrast material remains in small bowel loops in the left lower quadrant. No colonic contrast identified. Small bowel loops are air-filled and dilated measuring up to 6.7 cm, similar to the prior study. Calcified uterine fibroid again noted. IMPRESSION: 1. Contrast material remains in small bowel loops in the left lower quadrant. Dilated small bowel is unchanged. Findings are compatible with small-bowel obstruction. Electronically Signed   By: Ronney Asters M.D.   On: 12/23/2021 15:27   DG Abd Portable 1V-Small Bowel Obstruction Protocol-initial, 8 hr delay  Result Date: 12/22/2021 CLINICAL DATA:  8 hour small-bowel follow up EXAM: PORTABLE ABDOMEN - 1 VIEW COMPARISON:  Film from earlier in the same day. FINDINGS: Multiple loops of dilated small bowel are again identified. Previously administered contrast lies predominately within the mid to distal small bowel. No colonic contrast is noted. 24 hour follow-up is recommended. Calcified uterine fibroid is seen. IMPRESSION: Administered contrast lies within the dilated small bowel. No colonic contrast is seen. 24 hour follow-up is recommended. Electronically Signed   By: Inez Catalina M.D.   On: 12/22/2021 22:11   DG Abd Portable 1V  Result Date: 12/22/2021 CLINICAL DATA:  Small-bowel obstruction EXAM: PORTABLE ABDOMEN - 1 VIEW COMPARISON:  12/20/2021 FINDINGS: NG tube in the  stomach with the tip near the antrum. Dilated small bowel loops similar to the prior study compatible with small bowel obstruction. Colon decompressed. Calcified uterine fibroid in the right pelvis. IMPRESSION: NG tube in the gastric antrum.  Small bowel obstruction unchanged. Electronically Signed   By: Franchot Gallo M.D.   On: 12/22/2021 11:29   DG Chest Port 1 View  Result Date: 12/21/2021 CLINICAL DATA:  [id number] fever EXAM: PORTABLE CHEST 1 VIEW COMPARISON:  Chest x-ray from yesterday nausea correlation is made with a CT of the abdomen and pelvis from yesterday FINDINGS: There has been interval insertion of an NG tube with its distal end at the right upper-mid abdomen. There has been interval partial decompression of the dilated stomach at the left lower thorax. Large hiatal hernia containing the stomach extending up to the left mid thorax. Left basilar atelectasis. Cardiomegaly. Right lung remains clear. Moderately severe dilatation of the small bowel loops of small bowel obstruction. IMPRESSION: There has been interval insertion of an NG tube with its distal end of the right upper-mid abdomen. There has been interval partial decompression of the herniated stomach through the hiatal hernia extending up to the mid left hemithorax. Left basilar atelectasis. Findings of high-grade small-bowel obstruction. Electronically Signed   By: Frazier Richards M.D.   On: 12/21/2021 08:57   DG Abd Portable 1V-Small Bowel Protocol-Position Verification  Result Date: 12/20/2021 CLINICAL DATA:  NG tube placement EXAM: PORTABLE ABDOMEN - 1 VIEW COMPARISON:  CT 12/20/2021 FINDINGS: Elevated left diaphragm with large hiatal hernia and intrathoracic stomach. Esophageal tube tip is below the diaphragm and probably in the region of gastroduodenal junction. Multiple loops of dilated small bowel consistent with an obstruction IMPRESSION: 1. Esophageal tube tip likely over the gastroduodenal junction 2. Elevated left diaphragm with  large hiatal hernia and intrathoracic stomach. Subsegmental atelectasis at the left base. 3. Multiple dilated loops of small bowel consistent with an obstruction Electronically Signed   By: Maudie Mercury  Francoise Ceo M.D.   On: 12/20/2021 23:24   CT ABDOMEN PELVIS W CONTRAST  Result Date: 12/20/2021 CLINICAL DATA:  Abdominal pain, acute, nonlocalized n/v, no BM x5 days, diffuse abd pain lower EXAM: CT ABDOMEN AND PELVIS WITH CONTRAST TECHNIQUE: Multidetector CT imaging of the abdomen and pelvis was performed using the standard protocol following bolus administration of intravenous contrast. RADIATION DOSE REDUCTION: This exam was performed according to the departmental dose-optimization program which includes automated exposure control, adjustment of the mA and/or kV according to patient size and/or use of iterative reconstruction technique. CONTRAST:  135m OMNIPAQUE IOHEXOL 300 MG/ML  SOLN COMPARISON:  CT 02/01/2021 FINDINGS: Lower chest: Bibasilar atelectasis. Unchanged elevated left hemidiaphragm. Dilated and fluid-filled visualized esophagus. Hepatobiliary: No focal liver abnormality is seen. Mild gallbladder distension. Pancreas: Unremarkable. No pancreatic ductal dilatation or surrounding inflammatory changes. Spleen: Normal in size without focal abnormality. Adrenals/Urinary Tract: Adrenal glands are unremarkable. No hydronephrosis. There are nonobstructing left lower pole renal stones largest measuring up to 4 mm. The bladder is moderately distended. Stomach/Bowel: Large hiatal hernia with dilated partially intrathoracic stomach. Multiple dilated loops of small bowel with transition point in the right upper abdomen (series 2, image 38, series 6, image 29-30). There is mesenteric edema.Prior right hemicolectomy with ileocolic anastomosis. Vascular/Lymphatic: Scattered atherosclerosis. No AAA. No lymphadenopathy. Reproductive: There is a subserosal or pedunculated calcified uterine fibroid which now appears on the  right, likely due to rightward mass effect on the uterus by ascites and previously described small bowel obstruction. Other: Small volume abdominopelvic ascites.  No free air. Musculoskeletal: No acute osseous abnormality. Unchanged anterolisthesis at L4-L5. Moderate bilateral hip osteoarthritis. 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 Chronic changes at the origin of the left rectus femoris. There is diffuse muscle atrophy. IMPRESSION: High-grade small-bowel obstruction with transition point in the right upper abdomen, likely due to adhesions. Large hiatal hernia with dilated partially intrathoracic stomach related to the SBO. Small volume abdominopelvic ascites. Additional chronic findings as described above. Electronically Signed   By: JMaurine SimmeringM.D.   On: 12/20/2021 15:27   DG Chest Portable 1 View  Result Date: 12/20/2021 CLINICAL DATA:  Abdominal pain and vomiting. Small-bowel obstruction. EXAM: PORTABLE CHEST 1 VIEW COMPARISON:  CT of the abdomen and pelvis 02/01/2021 FINDINGS: The heart is enlarged. Chronic elevation of left hemidiaphragm present. The stomach is within the hernia, dilated. Additional loops of bowel are present within the hernia. Moderate distension of small bowel noted in the abdomen. IMPRESSION: 1. Moderate distension of small bowel compatible with small bowel obstruction or ileus. Dedicated imaging of the abdomen scratched at dedicated radiographs of the abdomen or CT of the abdomen pelvis would be useful for further evaluation. 2. Chronic elevation of left hemidiaphragm/hernia. 3. Cardiomegaly. Electronically Signed   By: CSan MorelleM.D.   On: 12/20/2021 13:03       Subjective: Appetite slow to return Reports feeling a little bit better  Objective: Vitals:   01/02/22 2020 01/03/22 0538 01/03/22 0540 01/03/22 1000  BP:  115/73    Pulse:  (!) 115    Resp: 20  20   Temp:  (!) 97.5 F (36.4 C)    TempSrc:  Oral    SpO2:  100% 100% 96%   Weight:      Height:        Intake/Output Summary (Last 24 hours) at 01/03/2022 1329 Last data filed at 01/03/2022 1000 Gross per 24 hour  Intake 1924.37 ml  Output 1725 ml  Net 199.37 ml   Filed Weights   12/23/21 0600 12/26/21 1230 12/28/21 0949  Weight: 80.4 kg 84.1 kg 84.1 kg    Examination:  General exam: Appears calm and comfortable  Respiratory system: Clear to auscultation. Respiratory effort normal. Cardiovascular system: S1 & S2 heard, RRR. No JVD, murmurs, rubs, gallops or clicks. No pedal edema. Gastrointestinal system: Abdomen is nondistended, soft and nontender. No organomegaly or masses felt. Normal bowel sounds heard. Central nervous system: Alert and oriented. No focal neurological deficits. Extremities: wwp,  Skin: No rashes, lesions or ulcers Psychiatry: Judgement and insight appear normal. Flat affect    Data Reviewed: I have personally reviewed following labs and imaging studies  CBC: Recent Labs  Lab 12/30/21 0303 12/31/21 0328 01/01/22 0335 01/02/22 0458 01/03/22 0452  WBC 15.5* 16.8* 14.2* 14.1* 13.7*  NEUTROABS 11.7* 13.1* 11.0* 11.3* 11.4*  HGB 8.7* 8.5* 8.4* 9.8* 8.3*  HCT 24.6* 24.2* 23.9* 28.8* 23.9*  MCV 75.2* 75.2* 74.0* 76.6* 75.2*  PLT 195 227 273 262 024   Basic Metabolic Panel: Recent Labs  Lab 12/29/21 0308 12/30/21 0303 12/31/21 0328 01/02/22 0458 01/02/22 1219 01/03/22 0452  NA 141 137 136 132*  --  132*  K 3.5 4.1 4.5 5.7* 4.8 4.4  CL 107 106 104 98  --  98  CO2 '28 25 25 24  '$ --  27  GLUCOSE 168* 120* 114* 104*  --  141*  BUN '12 14 13 14  '$ --  14  CREATININE 0.43* 0.40* 0.45 0.50  --  0.45  CALCIUM 8.2* 7.8* 8.0* 8.4*  --  8.2*  MG 1.8 1.8 1.9 2.1  --  1.9  PHOS 1.8* 3.1 4.0 4.8*  --  4.5   GFR: Estimated Creatinine Clearance: 73.5 mL/min (by C-G formula based on SCr of 0.45 mg/dL). Liver Function Tests: Recent Labs  Lab 12/31/21 0328 01/03/22 0452  AST 18 45*  ALT 12 39  ALKPHOS 86 156*  BILITOT 0.4 0.6   PROT 6.5 7.3  ALBUMIN 2.1* 2.2*   No results for input(s): "LIPASE", "AMYLASE" in the last 168 hours. No results for input(s): "AMMONIA" in the last 168 hours. Coagulation Profile: No results for input(s): "INR", "PROTIME" in the last 168 hours. Cardiac Enzymes: No results for input(s): "CKTOTAL", "CKMB", "CKMBINDEX", "TROPONINI" in the last 168 hours. BNP (last 3 results) No results for input(s): "PROBNP" in the last 8760 hours. HbA1C: No results for input(s): "HGBA1C" in the last 72 hours. CBG: Recent Labs  Lab 01/02/22 0549 01/02/22 1734 01/03/22 0023 01/03/22 0632 01/03/22 1143  GLUCAP 102* 81 115* 118* 102*   Lipid Profile: Recent Labs    01/03/22 0452  TRIG 65   Thyroid Function Tests: Recent Labs    01/01/22 0335  TSH 5.848*   Anemia Panel: No results for input(s): "VITAMINB12", "FOLATE", "FERRITIN", "TIBC", "IRON", "RETICCTPCT" in the last 72 hours. Sepsis Labs: No results for input(s): "PROCALCITON", "LATICACIDVEN" in the last 168 hours.  No results found for this or any previous visit (from the past 240 hour(s)).       Radiology Studies: No results found.      Scheduled Meds:  acetaminophen  1,000 mg Oral Q6H   bisacodyl  10 mg Rectal Daily   Chlorhexidine Gluconate Cloth  6 each Topical Daily   feeding supplement  237 mL Oral TID WC   fluticasone  1 spray Each Nare BID   insulin aspart  0-9 Units Subcutaneous Q6H   levothyroxine  100 mcg Intravenous  Daily   lip balm   Topical BID   methocarbamol  500 mg Oral Q6H   metoprolol tartrate  5 mg Intravenous Q6H   pantoprazole (PROTONIX) IV  40 mg Intravenous Q12H   sodium chloride flush  10-40 mL Intracatheter Q12H   Continuous Infusions:  dextrose 5 % and 0.45% NaCl 10 mL/hr at 01/03/22 0332   heparin 1,200 Units/hr (01/03/22 0332)   TPN ADULT (ION) 80 mL/hr at 01/03/22 0332   TPN ADULT (ION)       LOS: 14 days    Time spent: 35 min    Nicolette Bang, MD [facility]  If 7PM-7AM, please contact night-coverage  01/03/2022, 1:29 PM

## 2022-01-04 DIAGNOSIS — D509 Iron deficiency anemia, unspecified: Secondary | ICD-10-CM | POA: Diagnosis not present

## 2022-01-04 DIAGNOSIS — K56609 Unspecified intestinal obstruction, unspecified as to partial versus complete obstruction: Secondary | ICD-10-CM | POA: Diagnosis not present

## 2022-01-04 DIAGNOSIS — Z7901 Long term (current) use of anticoagulants: Secondary | ICD-10-CM | POA: Diagnosis not present

## 2022-01-04 DIAGNOSIS — I10 Essential (primary) hypertension: Secondary | ICD-10-CM | POA: Diagnosis not present

## 2022-01-04 LAB — CBC WITH DIFFERENTIAL/PLATELET
Abs Immature Granulocytes: 0.15 10*3/uL — ABNORMAL HIGH (ref 0.00–0.07)
Basophils Absolute: 0 10*3/uL (ref 0.0–0.1)
Basophils Relative: 0 %
Eosinophils Absolute: 0.3 10*3/uL (ref 0.0–0.5)
Eosinophils Relative: 2 %
HCT: 24.6 % — ABNORMAL LOW (ref 36.0–46.0)
Hemoglobin: 8.4 g/dL — ABNORMAL LOW (ref 12.0–15.0)
Immature Granulocytes: 1 %
Lymphocytes Relative: 9 %
Lymphs Abs: 1.1 10*3/uL (ref 0.7–4.0)
MCH: 25.8 pg — ABNORMAL LOW (ref 26.0–34.0)
MCHC: 34.1 g/dL (ref 30.0–36.0)
MCV: 75.5 fL — ABNORMAL LOW (ref 80.0–100.0)
Monocytes Absolute: 0.6 10*3/uL (ref 0.1–1.0)
Monocytes Relative: 5 %
Neutro Abs: 9.7 10*3/uL — ABNORMAL HIGH (ref 1.7–7.7)
Neutrophils Relative %: 83 %
Platelets: 311 10*3/uL (ref 150–400)
RBC: 3.26 MIL/uL — ABNORMAL LOW (ref 3.87–5.11)
RDW: 18 % — ABNORMAL HIGH (ref 11.5–15.5)
WBC: 11.8 10*3/uL — ABNORMAL HIGH (ref 4.0–10.5)
nRBC: 0 % (ref 0.0–0.2)

## 2022-01-04 LAB — COMPREHENSIVE METABOLIC PANEL
ALT: 50 U/L — ABNORMAL HIGH (ref 0–44)
AST: 56 U/L — ABNORMAL HIGH (ref 15–41)
Albumin: 2.2 g/dL — ABNORMAL LOW (ref 3.5–5.0)
Alkaline Phosphatase: 179 U/L — ABNORMAL HIGH (ref 38–126)
Anion gap: 7 (ref 5–15)
BUN: 15 mg/dL (ref 8–23)
CO2: 26 mmol/L (ref 22–32)
Calcium: 8.2 mg/dL — ABNORMAL LOW (ref 8.9–10.3)
Chloride: 102 mmol/L (ref 98–111)
Creatinine, Ser: 0.43 mg/dL — ABNORMAL LOW (ref 0.44–1.00)
GFR, Estimated: >60 mL/min (ref >60)
Glucose, Bld: 119 mg/dL — ABNORMAL HIGH (ref 70–99)
Potassium: 4 mmol/L (ref 3.5–5.1)
Sodium: 135 mmol/L (ref 135–145)
Total Bilirubin: 0.7 mg/dL (ref 0.3–1.2)
Total Protein: 7.4 g/dL (ref 6.5–8.1)

## 2022-01-04 LAB — GLUCOSE, CAPILLARY
Glucose-Capillary: 105 mg/dL — ABNORMAL HIGH (ref 70–99)
Glucose-Capillary: 108 mg/dL — ABNORMAL HIGH (ref 70–99)
Glucose-Capillary: 113 mg/dL — ABNORMAL HIGH (ref 70–99)
Glucose-Capillary: 115 mg/dL — ABNORMAL HIGH (ref 70–99)
Glucose-Capillary: 117 mg/dL — ABNORMAL HIGH (ref 70–99)
Glucose-Capillary: 117 mg/dL — ABNORMAL HIGH (ref 70–99)

## 2022-01-04 LAB — HEPARIN LEVEL (UNFRACTIONATED): Heparin Unfractionated: 0.26 IU/mL — ABNORMAL LOW (ref 0.30–0.70)

## 2022-01-04 LAB — PHOSPHORUS: Phosphorus: 3.9 mg/dL (ref 2.5–4.6)

## 2022-01-04 LAB — MAGNESIUM: Magnesium: 1.9 mg/dL (ref 1.7–2.4)

## 2022-01-04 MED ORDER — PANTOPRAZOLE SODIUM 40 MG PO TBEC
40.0000 mg | DELAYED_RELEASE_TABLET | Freq: Two times a day (BID) | ORAL | Status: DC
  Administered 2022-01-04 – 2022-01-10 (×12): 40 mg via ORAL
  Filled 2022-01-04 (×12): qty 1

## 2022-01-04 MED ORDER — TRAVASOL 10 % IV SOLN
INTRAVENOUS | Status: AC
  Filled 2022-01-04: qty 451.2

## 2022-01-04 MED ORDER — RIVAROXABAN 10 MG PO TABS
20.0000 mg | ORAL_TABLET | Freq: Every day | ORAL | Status: DC
  Administered 2022-01-04 – 2022-01-09 (×6): 20 mg via ORAL
  Filled 2022-01-04 (×6): qty 2

## 2022-01-04 MED ORDER — LEVOTHYROXINE SODIUM 125 MCG PO TABS
125.0000 ug | ORAL_TABLET | Freq: Every day | ORAL | Status: DC
  Administered 2022-01-05 – 2022-01-10 (×6): 125 ug via ORAL
  Filled 2022-01-04 (×6): qty 1

## 2022-01-04 MED ORDER — INSULIN ASPART 100 UNIT/ML IJ SOLN
0.0000 [IU] | Freq: Three times a day (TID) | INTRAMUSCULAR | Status: DC
  Administered 2022-01-05 – 2022-01-06 (×3): 1 [IU] via SUBCUTANEOUS

## 2022-01-04 NOTE — Progress Notes (Signed)
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 Made the tongue "feel strange" Per patient, she has since eaten crab legs and did not have another reaction.    Patient Measurements: Height: '5\' 6"'$  (167.6 cm) Weight: 84.1 kg (185 lb 6.5 oz) IBW/kg (Calculated) : 59.3 Heparin Dosing Weight: 77 kg  Vital Signs: Temp: 98.7 F (37.1 C) (07/07 0519) Temp Source: Oral (07/07 0519) BP: 105/68 (07/07 0519) Pulse Rate: 102 (07/07 0519)  Labs: Recent Labs    01/02/22 0458 01/02/22 1459 01/02/22 1654 01/03/22 0452 01/04/22 0541  HGB 9.8*  --   --  8.3* 8.4*  HCT 28.8*  --   --  23.9* 24.6*  PLT 262  --   --  300 311  HEPARINUNFRC 0.83*   < > 0.33 0.36 0.26*  CREATININE 0.50  --   --  0.45 0.43*   < > = values in this interval not displayed.    Estimated Creatinine Clearance: 73.5 mL/min (A) (by C-G formula based on SCr of 0.43 mg/dL (L)).  Medications: Rivaroxaban 20 mg PO Daily PTA.  Last dose: 6/21  Assessment: Pt is a 86 yoF admitted with SBO and initially treated with conservative management. PMH significant for DVT in 2018, hypercoagulable state on Xarelto PTA.   Xarelto held on admission, was on enoxaparin for DVT ppx from 6/22 > 6/27. Heparin drip was started on 6/27, but has been intermittently infusing as it was held for possible surgery on 6/28, 6/29, and 6/30 AM. Heparin drip was resumed 8 hours postop.   01/04/2022  AM heparin level = 0.26 units/mL, slightly low Heparin infusing through PICC line  No issues reported CBC: Hgb low but stable, plts wnl  Goal of Therapy:  Heparin level 0.3-0.7 units/ml Monitor platelets by anticoagulation protocol: Yes   Plan:  Increase heparin infusion to 1,300 units/hr Check heparin level in 6 hours Heparin level, CBC daily  Follow up ability  to tolerate enteral meds and change back to home Xarelto when able.    Elenor Quinones, PharmD, BCPS, BCIDP Clinical Pharmacist 01/04/2022 7:24 AM

## 2022-01-04 NOTE — Progress Notes (Signed)
PROGRESS NOTE    Katrina Donaldson  ZOX:[id number] DOB: Jun 08, 1956 DOA: 12/20/2021 PCP: Donald Prose, MD   Brief Narrative:  66 y.o.f w/ history significant of laparoscopic partial colectomy in 01/2021 2/2 cecal mass, thyroidectomy, brain aneurysm, chronic anticoagulation, hypertension, hypothyroidism, CVA, sacral osteomyelitis, wheelchair-bound presented from SNF complaining of abdominal pain, nausea, vomiting, poor appetite  x 5 days, last BM 5 days PTA. In the ED, vital signs noted for tachycardia otherwise fairly stable, labs noted for mild anemia, otherwise stable.  CT abdomen/pelvis showed high-grade SBO likely due to adhesions, large hiatal hernia.  Surgery consulted and patient admitted for further management  Patient has failed to progress despite having NG tube decompression IV fluid hydration. She was noted to have tachycardia abnormal EKG, no chest pain troponins are negative, EKG shows sinus tachycardia. Echocardiogram showed decreased EF 30-35%. Labs with improved/stable renal function, stable hemoglobin at 10.2 g.  Seen by cardiology.  Sbo did not improve subsequently underwent ex laparotomy with lysis of adhesion by Dr Barry Dienes 6/30.patient continued on NG decompression and clamping trial-ng fell off 7/4-keeping it off.  Awaiting return of bowel function   Assessment & Plan:   Principal Problem:   SBO (small bowel obstruction) (HCC) Active Problems:   Essential hypertension   Anemia, iron deficiency   Hypothyroidism   Hypercoagulable state ([place])   Hypokalemia   Confusion   Chronic anticoagulation   Cecum mass   Sacral osteomyelitis (HCC)   Pressure injury of sacral region, stage 4 (HCC)   AKI (acute kidney injury) ([place])   Pressure ulcer of sacral region, stage 3 ([place])   Cardiomyopathy ([place])   Bedridden   Incarcerated hiatal hernia   SBO secondary to adhesions: s/p ex laparotomy with lysis of adhesion by Dr Barry Dienes 6/30, after it did not improve on conservative  management.continue TPN-although at reduced rate as diet advances, but will continue for another day as pt has had minimal po intake.  Gen surg did liberlaize diet to encourage po intake Continue remaining plan of care as per surgical service Continue pain control DVT prophylaxis and ambulation PT OT.  Follow-up pathology on Biopsy from mesenteric nodule.     Large hiatal hernia: on PPI twice daily Leukocytosis postop: WBC count now downtrending remains afebrile, no UTI.  Foley off.  Monitor Hypernatremia w/ Hyperchloremia:Resolved  Hypokalemia resolved  Hyperkalemia-resolved 4.4   Cardiomyopathy w/ new acute systolic dysfunction with EF 30 to 35% Essential hypertension Sinus tachycardia: Likely tachycardia mediated acute systolic dysfunction in the setting of acute illness. Cardiology following, on metoprolol 5 mg scheduled/prn, monitor in tele.She will need repeat echo once acute illness resolves and outpatient CHF follow-up.  CHF is compensated.  Continue to monitor intake output, watch for fluid overload- noted some wt changes-if symptomatic add lasix, f/up Cardio recs today if anything needs to be adjusted for tachycardia .Net IO Since Admission: 5,665.8 mL [01/02/22 0905]   Normocytic anemia Iron deficiency Anemia of chronic disease:  Hgb  8.4  Transfuse if less than 7 g.  GERD:cont IV PPI. WJX:BJYNWG on hold. Fever-several days ago, no recurrence/resolved Acute urine retention-resolved. Off foley 7/3 Hypothyroidism: cont IV Synthroid NFA:OZHYQMVH. Hypophosphatemia resolved.monitor.   History of DVT starting in 2005//Hypercoagulable state Chronic DVT ON [place] 6/28: Xarelto on hold-remains on heparin drip-dosing/monitoring per pharmacy   Goals of care full code, prognosis remains to be seen given patient's acute multiple medical problems including bowel obstruction and acute systolic dysfunction.   Stage III pressure ulcer on sacrum POA: Continue wound care  and offloading. Ext  foley        Pressure Injury 12/20/21 Sacrum Posterior Stage 3 -  Full thickness tissue loss. Subcutaneous fat may be visible but bone, tendon or muscle are NOT exposed. Red (Active)  12/20/21 1950  Location:Sacrum  Location Orientation:Posterior  Staging:Stage 3-Full thickness tissue loss. Subcutaneous fat may be visible but bone, tendon or muscle are NOT exposed.  Wound Description (Comments):Red  Present on Admission:Yes  Dressing Type Foam:Lift dressing to assess site every shift.     DVT prophylaxis: SCD/Compression stockings  Code Status: full    Code Status Orders  (From admission, onward)           Start     Ordered   12/20/21 1811  Full code  Continuous        12/20/21 1810           Code Status History     Date Active Date Inactive Code Status Order ID Comments User Context   01/26/2021 2309 02/14/2021 0818 Full Code [id number]  Kayleen Memos, DO ED   06/03/2017 2108 06/05/2017 2129 Full Code [id number]  Consuella Lose, MD Inpatient   07/24/2016 1847 08/24/2016 1901 Full Code [id number]  Cathlyn Parsons, PA-C Inpatient   07/24/2016 1847 07/24/2016 1847 Full Code [id number]  Cathlyn Parsons, PA-C Inpatient   07/19/2016 2034 07/24/2016 Robert Lee Full Code [id number]  Consuella Lose, MD Inpatient   07/21/2015 1026 07/22/2015 0326 Full Code [id number]  Consuella Lose, MD HOV      Advance Directive Documentation    Flowsheet Row Most Recent Value  Type of Advance Directive Healthcare Power of Attorney  Pre-existing out of facility DNR order (yellow form or pink MOST form) --  "MOST" Form in Place? --      Family Communication: discussed with patient  Disposition Plan:    Patient requiring continue TPN, not stable for discharge Consults called: None Admission status: Inpatient   Consultants:  Gen surg  Procedures:  DG CHEST PORT 1 VIEW  Result Date: 12/31/2021 CLINICAL DATA:  Leukocytosis EXAM: PORTABLE CHEST 1 VIEW COMPARISON:  12/21/2021 FINDINGS:  Patient rotated to the right. Nasogastric tube terminates in the herniated stomach with side port likely just below the gastroesophageal junction. No pleural effusion or pneumothorax. Large hiatal hernia with abdominal organs positioned in the lower left hemithorax. No congestive failure. Volume loss in the left lung base. Clear right lung. Right PICC line terminates at the superior caval/atrial junction. IMPRESSION: Large hiatal hernia. Volume loss within the adjacent left lung base, similar. Nasogastric tube currently positioned within the herniated stomach. Cardiomegaly without congestive failure. Electronically Signed   By: Abigail Miyamoto M.D.   On: 12/31/2021 09:21   VAS [place] LOWER EXTREMITY VENOUS (DVT)  Result Date: 12/26/2021  Lower Venous DVT Study Patient Name:  AKIKO SCHEXNIDER  Date of Exam:   12/26/2021 Medical Rec #: [id number]        Accession #:    [id number] Date of Birth: 12-Apr-1956        Patient Gender: F Patient Age:   8 years Exam Location:  [facility] Procedure:      VAS [place] LOWER EXTREMITY VENOUS (DVT) Referring Phys: [place] --------------------------------------------------------------------------------  Indications: Swelling.  Risk Factors: DVT. Limitations: Poor ultrasound/tissue interface and patient positioning, patient immobility, patient pain tolerance. Comparison Study: 07/25/2016 - - Findings consistent with acute deep vein                   thrombosis  involving the                   right common femoral vein, right profunda femoris vein, right                   femoral vein, right popliteal vein, right posterial tibial                   vein,                   and right peroneal vein.                   - Findings consistent with acute superficial vein thrombosis                   involving the right small saphenous vein.                   - No evidence of deep vein thrombosis involving the left lower                   extremity.                   - No evidence of Baker&'s  cyst on the right or left. Performing Technologist: Oliver Hum RVT  Examination Guidelines: A complete evaluation includes B-mode imaging, spectral Doppler, color Doppler, and power Doppler as needed of all accessible portions of each vessel. Bilateral testing is considered an integral part of a complete examination. Limited examinations for reoccurring indications may be performed as noted. The reflux portion of the exam is performed with the patient in reverse Trendelenburg.  +---------+---------------+---------+-----------+----------+--------------+ RIGHT    CompressibilityPhasicitySpontaneityPropertiesThrombus Aging +---------+---------------+---------+-----------+----------+--------------+ CFV      Partial        Yes      Yes                  Chronic        +---------+---------------+---------+-----------+----------+--------------+ SFJ      Full                                                        +---------+---------------+---------+-----------+----------+--------------+ FV Prox  Full                                                        +---------+---------------+---------+-----------+----------+--------------+ FV Mid                  Yes      Yes                                 +---------+---------------+---------+-----------+----------+--------------+ FV DistalFull                                                        +---------+---------------+---------+-----------+----------+--------------+ PFV      Full                                                        +---------+---------------+---------+-----------+----------+--------------+  POP      Full           Yes      Yes                                 +---------+---------------+---------+-----------+----------+--------------+ PTV      Full                                                        +---------+---------------+---------+-----------+----------+--------------+ PERO     Full                                                         +---------+---------------+---------+-----------+----------+--------------+   +---------+---------------+---------+-----------+----------+-------------------+ LEFT     CompressibilityPhasicitySpontaneityPropertiesThrombus Aging      +---------+---------------+---------+-----------+----------+-------------------+ CFV      Full           Yes      Yes                                      +---------+---------------+---------+-----------+----------+-------------------+ SFJ      Full                                                             +---------+---------------+---------+-----------+----------+-------------------+ FV Prox  Full                                                             +---------+---------------+---------+-----------+----------+-------------------+ FV Mid   Full                                                             +---------+---------------+---------+-----------+----------+-------------------+ FV Distal               Yes      Yes                                      +---------+---------------+---------+-----------+----------+-------------------+ PFV      Full                                                             +---------+---------------+---------+-----------+----------+-------------------+ POP      Full  Yes      Yes                                      +---------+---------------+---------+-----------+----------+-------------------+ PTV      Full                                                             +---------+---------------+---------+-----------+----------+-------------------+ PERO                                                  Not well visualized +---------+---------------+---------+-----------+----------+-------------------+     Summary: RIGHT: - Findings consistent with chronic deep vein thrombosis involving the right common femoral  vein. - No cystic structure found in the popliteal fossa.  LEFT: - There is no evidence of deep vein thrombosis in the lower extremity. However, portions of this examination were limited- see technologist comments above.  - No cystic structure found in the popliteal fossa.  *See table(s) above for measurements and observations. Electronically signed by Orlie Pollen on 12/26/2021 at 5:31:11 PM.    Final    ECHOCARDIOGRAM COMPLETE  Result Date: 12/26/2021    ECHOCARDIOGRAM REPORT   Patient Name:   BETTYMAE YOTT Date of Exam: 12/26/2021 Medical Rec #:  [id number]       Height:       66.0 in Accession #:    [id number]      Weight:       177.2 lb Date of Birth:  03-Apr-1956       BSA:          1.900 m Patient Age:    66 years        BP:           125/82 mmHg Patient Gender: F               HR:           120 bpm. Exam Location:  Inpatient Procedure: 2D Echo, Color Doppler and Cardiac Doppler Indications:    Abnormal ekg  History:        Patient has no prior history of Echocardiogram examinations.  Sonographer:    Joette Catching RCS Referring Phys: [id number] [place]  Sonographer Comments: Technically challenging study due to limited acoustic windows. Supine scan. IMPRESSIONS  1. Left ventricular ejection fraction, by estimation, is 30 to 35%. The left ventricle has moderately decreased function. The left ventricle demonstrates global hypokinesis. Left ventricular diastolic parameters are consistent with Grade I diastolic dysfunction (impaired relaxation).  2. Right ventricular systolic function is normal. The right ventricular size is normal.  3. The mitral valve is normal in structure. No evidence of mitral valve regurgitation. No evidence of mitral stenosis.  4. The aortic valve is calcified. There is mild calcification of the aortic valve. There is mild thickening of the aortic valve. Aortic valve regurgitation is not visualized. Aortic valve sclerosis is present, with no evidence of aortic valve stenosis.  5. The  inferior vena cava is normal in size with greater than 50% respiratory variability, suggesting right atrial pressure of 3 mmHg.  FINDINGS  Left Ventricle: Left ventricular ejection fraction, by estimation, is 30 to 35%. The left ventricle has moderately decreased function. The left ventricle demonstrates global hypokinesis. The left ventricular internal cavity size was normal in size. There is no left ventricular hypertrophy. Abnormal (paradoxical) septal motion, consistent with left bundle branch block. Left ventricular diastolic parameters are consistent with Grade I diastolic dysfunction (impaired relaxation). Right Ventricle: The right ventricular size is normal. No increase in right ventricular wall thickness. Right ventricular systolic function is normal. Left Atrium: Left atrial size was normal in size. Right Atrium: Right atrial size was normal in size. Pericardium: There is no evidence of pericardial effusion. Mitral Valve: The mitral valve is normal in structure. No evidence of mitral valve regurgitation. No evidence of mitral valve stenosis. Tricuspid Valve: The tricuspid valve is normal in structure. Tricuspid valve regurgitation is not demonstrated. No evidence of tricuspid stenosis. Aortic Valve: The aortic valve is calcified. There is mild calcification of the aortic valve. There is mild thickening of the aortic valve. Aortic valve regurgitation is not visualized. Aortic valve sclerosis is present, with no evidence of aortic valve stenosis. Aortic valve mean gradient measures 4.0 mmHg. Aortic valve peak gradient measures 6.2 mmHg. Aortic valve area, by VTI measures 2.26 cm. Pulmonic Valve: The pulmonic valve was normal in structure. Pulmonic valve regurgitation is not visualized. No evidence of pulmonic stenosis. Aorta: The aortic root is normal in size and structure. Venous: The inferior vena cava is normal in size with greater than 50% respiratory variability, suggesting right atrial pressure of 3  mmHg. IAS/Shunts: No atrial level shunt detected by color flow Doppler.  LEFT VENTRICLE PLAX 2D LVIDd:         3.80 cm   Diastology LVIDs:         3.30 cm   LV e' medial:    5.22 cm/s LV PW:         1.00 cm   LV E/e' medial:  10.0 LV IVS:        0.90 cm   LV e' lateral:   5.77 cm/s LVOT diam:     2.30 cm   LV E/e' lateral: 9.1 LV SV:         30 LV SV Index:   16 LVOT Area:     4.15 cm  RIGHT VENTRICLE             IVC RV S prime:     29.70 cm/s  IVC diam: 1.70 cm TAPSE (M-mode): 2.1 cm LEFT ATRIUM             Index LA diam:        2.10 cm 1.11 cm/m LA Vol (A2C):   20.0 ml 10.53 ml/m LA Vol (A4C):   57.6 ml 30.32 ml/m LA Biplane Vol: 35.8 ml 18.84 ml/m  AORTIC VALVE                    PULMONIC VALVE AV Area (Vmax):    2.08 cm     PV Vmax:          1.15 m/s AV Area (Vmean):   2.10 cm     PV Peak grad:     5.3 mmHg AV Area (VTI):     2.26 cm     PR End Diast Vel: 10.76 msec AV Vmax:           125.00 cm/s AV Vmean:          94.000 cm/s  AV VTI:            0.134 m AV Peak Grad:      6.2 mmHg AV Mean Grad:      4.0 mmHg LVOT Vmax:         62.70 cm/s LVOT Vmean:        47.600 cm/s LVOT VTI:          0.073 m LVOT/AV VTI ratio: 0.54  AORTA Ao Root diam: 3.40 cm Ao Asc diam:  3.30 cm MITRAL VALVE               TRICUSPID VALVE MV Area (PHT): 5.84 cm    TR Peak grad:   25.0 mmHg MV Decel Time: 130 msec    TR Vmax:        250.00 cm/s MV E velocity: 52.30 cm/s MV A velocity: 78.40 cm/s  SHUNTS MV E/A ratio:  0.67        Systemic VTI:  0.07 m                            Systemic Diam: 2.30 cm Candee Furbish MD Electronically signed by Candee Furbish MD Signature Date/Time: 12/26/2021/5:05:24 PM    Final    [place] EKG SITE RITE  Result Date: 12/26/2021 If Site Rite image not attached, placement could not be confirmed due to current cardiac rhythm.  DG Abd Portable 1V  Result Date: 12/26/2021 CLINICAL DATA:  Small-bowel obstruction EXAM: PORTABLE ABDOMEN - 1 VIEW COMPARISON:  12/25/2021 FINDINGS: Persistent dilated loops of small  bowel particularly in the left abdomen. Maximum measurable distension is decreased. Calcified uterine fibroid is again noted. IMPRESSION: Persistent small bowel obstruction. Maximum distension measures decreased. Electronically Signed   By: Macy Mis M.D.   On: 12/26/2021 08:24   DG Abd Portable 1V  Result Date: 12/25/2021 CLINICAL DATA:  Small bowel obstruction. EXAM: PORTABLE ABDOMEN - 1 VIEW COMPARISON:  Abdominal x-ray from yesterday. FINDINGS: Enteric tube incompletely visualized looped in the left chest, presumably within the large hiatal hernia as seen on CT. Multiple dilated loops of small bowel are unchanged. Calcified uterine fibroid again noted. No acute osseous abnormality. IMPRESSION: 1. Unchanged small bowel obstruction. Electronically Signed   By: Titus Dubin M.D.   On: 12/25/2021 10:31   DG Abd 1 View  Result Date: 12/24/2021 CLINICAL DATA:  None hour delayed film.  Small bowel obstruction. EXAM: ABDOMEN - 1 VIEW COMPARISON:  12/23/2021 FINDINGS: Continued small bowel dilatation compatible with small bowel obstruction. Likely not significantly changed. No visible free air. Large calcified fibroid in the right side of the pelvis. NG tube is not visualized. This likely projects off the superior aspect of the image as the upper abdomen is not included on this single image. IMPRESSION: Continued small bowel obstruction pattern, not significantly changed. Electronically Signed   By: Rolm Baptise M.D.   On: 12/24/2021 23:14   DG Abd Portable 1V  Result Date: 12/23/2021 CLINICAL DATA:  Nasogastric tube placement. Small-bowel obstruction. EXAM: PORTABLE ABDOMEN - 1 VIEW COMPARISON:  Prior today FINDINGS: A nasogastric tube is now seen which appears coiled within a hiatal hernia. Moderately dilated small bowel loops show mild decrease in dilatation since prior exam. IMPRESSION: Nasogastric tube appears coiled within a hiatal hernia. Mildly decreased dilatation of small bowel loops since  prior exam. Electronically Signed   By: Marlaine Hind M.D.   On: 12/23/2021 16:27   DG Abd Portable 1V-Small  Bowel Obstruction Protocol-24 hr delay  Result Date: 12/23/2021 CLINICAL DATA:  Small bowel obstruction protocol, 24 hour delayed image. EXAM: PORTABLE ABDOMEN - 1 VIEW COMPARISON:  Abdominal x-rays 12/22/2021. FINDINGS: Contrast material remains in small bowel loops in the left lower quadrant. No colonic contrast identified. Small bowel loops are air-filled and dilated measuring up to 6.7 cm, similar to the prior study. Calcified uterine fibroid again noted. IMPRESSION: 1. Contrast material remains in small bowel loops in the left lower quadrant. Dilated small bowel is unchanged. Findings are compatible with small-bowel obstruction. Electronically Signed   By: Ronney Asters M.D.   On: 12/23/2021 15:27   DG Abd Portable 1V-Small Bowel Obstruction Protocol-initial, 8 hr delay  Result Date: 12/22/2021 CLINICAL DATA:  8 hour small-bowel follow up EXAM: PORTABLE ABDOMEN - 1 VIEW COMPARISON:  Film from earlier in the same day. FINDINGS: Multiple loops of dilated small bowel are again identified. Previously administered contrast lies predominately within the mid to distal small bowel. No colonic contrast is noted. 24 hour follow-up is recommended. Calcified uterine fibroid is seen. IMPRESSION: Administered contrast lies within the dilated small bowel. No colonic contrast is seen. 24 hour follow-up is recommended. Electronically Signed   By: Inez Catalina M.D.   On: 12/22/2021 22:11   DG Abd Portable 1V  Result Date: 12/22/2021 CLINICAL DATA:  Small-bowel obstruction EXAM: PORTABLE ABDOMEN - 1 VIEW COMPARISON:  12/20/2021 FINDINGS: NG tube in the stomach with the tip near the antrum. Dilated small bowel loops similar to the prior study compatible with small bowel obstruction. Colon decompressed. Calcified uterine fibroid in the right pelvis. IMPRESSION: NG tube in the gastric antrum.  Small bowel  obstruction unchanged. Electronically Signed   By: Franchot Gallo M.D.   On: 12/22/2021 11:29   DG Chest Port 1 View  Result Date: 12/21/2021 CLINICAL DATA:  [id number] fever EXAM: PORTABLE CHEST 1 VIEW COMPARISON:  Chest x-ray from yesterday nausea correlation is made with a CT of the abdomen and pelvis from yesterday FINDINGS: There has been interval insertion of an NG tube with its distal end at the right upper-mid abdomen. There has been interval partial decompression of the dilated stomach at the left lower thorax. Large hiatal hernia containing the stomach extending up to the left mid thorax. Left basilar atelectasis. Cardiomegaly. Right lung remains clear. Moderately severe dilatation of the small bowel loops of small bowel obstruction. IMPRESSION: There has been interval insertion of an NG tube with its distal end of the right upper-mid abdomen. There has been interval partial decompression of the herniated stomach through the hiatal hernia extending up to the mid left hemithorax. Left basilar atelectasis. Findings of high-grade small-bowel obstruction. Electronically Signed   By: Frazier Richards M.D.   On: 12/21/2021 08:57   DG Abd Portable 1V-Small Bowel Protocol-Position Verification  Result Date: 12/20/2021 CLINICAL DATA:  NG tube placement EXAM: PORTABLE ABDOMEN - 1 VIEW COMPARISON:  CT 12/20/2021 FINDINGS: Elevated left diaphragm with large hiatal hernia and intrathoracic stomach. Esophageal tube tip is below the diaphragm and probably in the region of gastroduodenal junction. Multiple loops of dilated small bowel consistent with an obstruction IMPRESSION: 1. Esophageal tube tip likely over the gastroduodenal junction 2. Elevated left diaphragm with large hiatal hernia and intrathoracic stomach. Subsegmental atelectasis at the left base. 3. Multiple dilated loops of small bowel consistent with an obstruction Electronically Signed   By: Donavan Foil M.D.   On: 12/20/2021 23:24   CT ABDOMEN PELVIS W  CONTRAST  Result Date: 12/20/2021 CLINICAL DATA:  Abdominal pain, acute, nonlocalized n/v, no BM x5 days, diffuse abd pain lower EXAM: CT ABDOMEN AND PELVIS WITH CONTRAST TECHNIQUE: Multidetector CT imaging of the abdomen and pelvis was performed using the standard protocol following bolus administration of intravenous contrast. RADIATION DOSE REDUCTION: This exam was performed according to the departmental dose-optimization program which includes automated exposure control, adjustment of the mA and/or kV according to patient size and/or use of iterative reconstruction technique. CONTRAST:  118m OMNIPAQUE IOHEXOL 300 MG/ML  SOLN COMPARISON:  CT 02/01/2021 FINDINGS: Lower chest: Bibasilar atelectasis. Unchanged elevated left hemidiaphragm. Dilated and fluid-filled visualized esophagus. Hepatobiliary: No focal liver abnormality is seen. Mild gallbladder distension. Pancreas: Unremarkable. No pancreatic ductal dilatation or surrounding inflammatory changes. Spleen: Normal in size without focal abnormality. Adrenals/Urinary Tract: Adrenal glands are unremarkable. No hydronephrosis. There are nonobstructing left lower pole renal stones largest measuring up to 4 mm. The bladder is moderately distended. Stomach/Bowel: Large hiatal hernia with dilated partially intrathoracic stomach. Multiple dilated loops of small bowel with transition point in the right upper abdomen (series 2, image 38, series 6, image 29-30). There is mesenteric edema.Prior right hemicolectomy with ileocolic anastomosis. Vascular/Lymphatic: Scattered atherosclerosis. No AAA. No lymphadenopathy. Reproductive: There is a subserosal or pedunculated calcified uterine fibroid which now appears on the right, likely due to rightward mass effect on the uterus by ascites and previously described small bowel obstruction. Other: Small volume abdominopelvic ascites.  No free air. Musculoskeletal: No acute osseous abnormality. Unchanged anterolisthesis at L4-L5.  Moderate bilateral hip osteoarthritis. 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 Chronic changes at the origin of the left rectus femoris. There is diffuse muscle atrophy. IMPRESSION: High-grade small-bowel obstruction with transition point in the right upper abdomen, likely due to adhesions. Large hiatal hernia with dilated partially intrathoracic stomach related to the SBO. Small volume abdominopelvic ascites. Additional chronic findings as described above. Electronically Signed   By: JMaurine SimmeringM.D.   On: 12/20/2021 15:27   DG Chest Portable 1 View  Result Date: 12/20/2021 CLINICAL DATA:  Abdominal pain and vomiting. Small-bowel obstruction. EXAM: PORTABLE CHEST 1 VIEW COMPARISON:  CT of the abdomen and pelvis 02/01/2021 FINDINGS: The heart is enlarged. Chronic elevation of left hemidiaphragm present. The stomach is within the hernia, dilated. Additional loops of bowel are present within the hernia. Moderate distension of small bowel noted in the abdomen. IMPRESSION: 1. Moderate distension of small bowel compatible with small bowel obstruction or ileus. Dedicated imaging of the abdomen scratched at dedicated radiographs of the abdomen or CT of the abdomen pelvis would be useful for further evaluation. 2. Chronic elevation of left hemidiaphragm/hernia. 3. Cardiomegaly. Electronically Signed   By: CSan MorelleM.D.   On: 12/20/2021 13:03       Subjective: Patient reports abdomen feeling moderately better this morning, did report trying to eat applesauce  Objective: Vitals:   01/03/22 1754 01/03/22 2216 01/04/22 0519 01/04/22 1138  BP: 111/65 (!) 99/56 105/68 (!) 123/50  Pulse: 98 98 (!) 102 (!) 102  Resp: '16 17 18 18  '$ Temp:  98.7 F (37.1 C) 98.7 F (37.1 C) 97.9 F (36.6 C)  TempSrc:  Oral Oral Oral  SpO2:  99% 98% 100%  Weight:      Height:        Intake/Output Summary (Last 24 hours) at 01/04/2022 1215 Last data filed at 01/04/2022 1000 Gross per 24 hour   Intake 1187.83 ml  Output 1400 ml  Net -212.17 ml  Filed Weights   12/23/21 0600 12/26/21 1230 12/28/21 0949  Weight: 80.4 kg 84.1 kg 84.1 kg    Examination:  General exam: Appears calm and comfortable  Respiratory system: Clear to auscultation. Respiratory effort normal. Cardiovascular system: S1 & S2 heard, RRR. No JVD, murmurs, rubs, gallops or clicks. No pedal edema. Gastrointestinal system: Abdomen is nondistended, soft and nontender. No organomegaly or masses felt. Normal bowel sounds heard. Central nervous system: Alert and oriented. No focal neurological deficits. Extremities: Symmetric 5 x 5 power. Skin: No rashes, lesions or ulcers Psychiatry: Judgement and insight appear normal. Mood & affect appropriate.     Data Reviewed: I have personally reviewed following labs and imaging studies  CBC: Recent Labs  Lab 12/31/21 0328 01/01/22 0335 01/02/22 0458 01/03/22 0452 01/04/22 0541  WBC 16.8* 14.2* 14.1* 13.7* 11.8*  NEUTROABS 13.1* 11.0* 11.3* 11.4* 9.7*  HGB 8.5* 8.4* 9.8* 8.3* 8.4*  HCT 24.2* 23.9* 28.8* 23.9* 24.6*  MCV 75.2* 74.0* 76.6* 75.2* 75.5*  PLT 227 273 262 300 295   Basic Metabolic Panel: Recent Labs  Lab 12/30/21 0303 12/31/21 0328 01/02/22 0458 01/02/22 1219 01/03/22 0452 01/04/22 0541  NA 137 136 132*  --  132* 135  K 4.1 4.5 5.7* 4.8 4.4 4.0  CL 106 104 98  --  98 102  CO2 '25 25 24  '$ --  27 26  GLUCOSE 120* 114* 104*  --  141* 119*  BUN '14 13 14  '$ --  14 15  CREATININE 0.40* 0.45 0.50  --  0.45 0.43*  CALCIUM 7.8* 8.0* 8.4*  --  8.2* 8.2*  MG 1.8 1.9 2.1  --  1.9 1.9  PHOS 3.1 4.0 4.8*  --  4.5 3.9   GFR: Estimated Creatinine Clearance: 73.5 mL/min (A) (by C-G formula based on SCr of 0.43 mg/dL (L)). Liver Function Tests: Recent Labs  Lab 12/31/21 0328 01/03/22 0452 01/04/22 0541  AST 18 45* 56*  ALT 12 39 50*  ALKPHOS 86 156* 179*  BILITOT 0.4 0.6 0.7  PROT 6.5 7.3 7.4  ALBUMIN 2.1* 2.2* 2.2*   No results for input(s):  "LIPASE", "AMYLASE" in the last 168 hours. No results for input(s): "AMMONIA" in the last 168 hours. Coagulation Profile: No results for input(s): "INR", "PROTIME" in the last 168 hours. Cardiac Enzymes: No results for input(s): "CKTOTAL", "CKMB", "CKMBINDEX", "TROPONINI" in the last 168 hours. BNP (last 3 results) No results for input(s): "PROBNP" in the last 8760 hours. HbA1C: No results for input(s): "HGBA1C" in the last 72 hours. CBG: Recent Labs  Lab 01/03/22 1143 01/03/22 1748 01/04/22 0028 01/04/22 0544 01/04/22 1133  GLUCAP 102* 125* 108* 117* 105*   Lipid Profile: Recent Labs    01/03/22 0452  TRIG 65   Thyroid Function Tests: No results for input(s): "TSH", "T4TOTAL", "FREET4", "T3FREE", "THYROIDAB" in the last 72 hours. Anemia Panel: No results for input(s): "VITAMINB12", "FOLATE", "FERRITIN", "TIBC", "IRON", "RETICCTPCT" in the last 72 hours. Sepsis Labs: No results for input(s): "PROCALCITON", "LATICACIDVEN" in the last 168 hours.  No results found for this or any previous visit (from the past 240 hour(s)).       Radiology Studies: No results found.      Scheduled Meds:  acetaminophen  1,000 mg Oral Q6H   bisacodyl  10 mg Rectal Daily   Chlorhexidine Gluconate Cloth  6 each Topical Daily   feeding supplement  237 mL Oral TID WC   fluticasone  1 spray Each Nare BID   insulin  aspart  0-9 Units Subcutaneous Q8H   [START ON 01/05/2022] levothyroxine  125 mcg Oral Q0600   lip balm   Topical BID   methocarbamol  500 mg Oral Q6H   metoprolol tartrate  5 mg Intravenous Q6H   pantoprazole  40 mg Oral BID   sodium chloride flush  10-40 mL Intracatheter Q12H   Continuous Infusions:  dextrose 5 % and 0.45% NaCl 10 mL/hr at 01/03/22 0332   heparin 1,300 Units/hr (01/04/22 0838)   TPN ADULT (ION) 40 mL/hr at 01/03/22 1803   TPN ADULT (ION)       LOS: 15 days    Time spent: 35 min    Nicolette Bang, MD [facility]  If 7PM-7AM,  please contact night-coverage  01/04/2022, 12:15 PM

## 2022-01-04 NOTE — Progress Notes (Signed)
Xarelto was given to patient at 2056.

## 2022-01-04 NOTE — Progress Notes (Signed)
PHARMACY - TOTAL PARENTERAL NUTRITION CONSULT NOTE   Indication: Small bowel obstruction  Patient Measurements: Height: '5\' 6"'$  (167.6 cm) Weight: 84.1 kg (185 lb 6.5 oz) IBW/kg (Calculated) : 59.3 TPN AdjBW (KG): 65.5 Body mass index is 29.93 kg/m.  Assessment:  66 yo F presents with abdominal pain, n/v, and poor appetite x 5 days. 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 No BM for 5 days as well. NG output was 1.6 L and xray shows SBO. Does report shellfish allergy but not any fish. Reacted to shrimp but not crab and was not an anaphylactic reaction. Pharmacy consulted for TPN.  Glucose / Insulin: No hx of DM. CBGs <180. 1 unit of SSI / 24 hrs. - 6/30 Dexamethasone '4mg'$   Electrolytes: lytes ok. Na borderline low. Renal: SCr WNL.   Hepatic:  LFTs slightly elevated. Tbili WNL. TG wnl. Albumin low at 2.2. Intake / Output; MIVF: UOP ok - PO Intake 39m (FLD) - D5 1/2NS at [place]/ Procedures:  6/30 Exploratory laparotomy and lysis of adhesions.   Central access: PICC placed 6/29 TPN start date: 6/29  Nutritional Goals: Goal TPN rate is 80 mL/hr (provides 90 g of protein and 2,085 kcals per day)  RD Assessment: Estimated Needs Total Energy Estimated Needs: 1950-2150 Total Protein Estimated Needs: 85-95g Total Fluid Estimated Needs: 2L/day  Current Nutrition:  TPN  Soft diet (not interested in eating), changing to regular diet in hopes this will help Ensure Enlive ordered TID (refused all on 7/6)  Plan:  At 18:00:  Continue TPN to half rate of 412mhr. Discussed with primary. Electrolytes in TPN: Na 10061mL, K 67m29m, Ca 5mEq19m Mg 7mEq/11mand Phos 10mmol24mCl:Ac 1:1 Add standard MVI and trace elements to TPN Reduce Sensitive SSI to Q8h and adjust as needed  Continue D51/2NS at [facility] TPN labs on Mon/Thurs F/U if regular diet tolerated to consider weaning off TPN  Laquisha Northcraft Elenor QuinonesD, BCPS, BCIDP Clinical  Pharmacist 01/04/2022 10:00 AM

## 2022-01-04 NOTE — Progress Notes (Addendum)
Progress Note  Patient Name: Katrina Donaldson Date of Encounter: 01/04/2022  [facility] Cardiologist: Skeet Latch, MD   Subjective   Not interested in eating. Feels tired. No chest pain of dyspnea.   Inpatient Medications    Scheduled Meds:  acetaminophen  1,000 mg Oral Q6H   bisacodyl  10 mg Rectal Daily   Chlorhexidine Gluconate Cloth  6 each Topical Daily   feeding supplement  237 mL Oral TID WC   fluticasone  1 spray Each Nare BID   insulin aspart  0-9 Units Subcutaneous Q8H   levothyroxine  100 mcg Intravenous Daily   lip balm   Topical BID   methocarbamol  500 mg Oral Q6H   metoprolol tartrate  5 mg Intravenous Q6H   pantoprazole (PROTONIX) IV  40 mg Intravenous Q12H   sodium chloride flush  10-40 mL Intracatheter Q12H   Continuous Infusions:  dextrose 5 % and 0.45% NaCl 10 mL/hr at 01/03/22 0332   heparin 1,200 Units/hr (01/03/22 2139)   TPN ADULT (ION) 40 mL/hr at 01/03/22 1803   PRN Meds: albuterol, alum & mag hydroxide-simeth, azelastine, diphenhydrAMINE, hydrALAZINE, hydrocortisone, lip balm, magic mouthwash, menthol-cetylpyridinium, metoprolol tartrate, morphine injection, ondansetron **OR** ondansetron (ZOFRAN) IV, oxyCODONE, phenol, polyvinyl alcohol, simethicone, sodium chloride, sodium chloride flush   Vital Signs    Vitals:   01/03/22 1353 01/03/22 1754 01/03/22 2216 01/04/22 0519  BP: (!) 127/93 111/65 (!) 99/56 105/68  Pulse: (!) 115 98 98 (!) 102  Resp: '14 16 17 18  '$ Temp: 98.4 F (36.9 C)  98.7 F (37.1 C) 98.7 F (37.1 C)  TempSrc: Oral  Oral Oral  SpO2: 98%  99% 98%  Weight:      Height:        Intake/Output Summary (Last 24 hours) at 01/04/2022 0824 Last data filed at 01/04/2022 0600 Gross per 24 hour  Intake 1777.11 ml  Output 1600 ml  Net 177.11 ml      12/28/2021    9:49 AM 12/26/2021   12:30 PM 12/23/2021    6:00 AM  Last 3 Weights  Weight (lbs) 185 lb 6.5 oz 185 lb 6.5 oz 177 lb 4 oz  Weight (kg) 84.1 kg 84.1 kg 80.4 kg       Telemetry    Sinus rhythm/tachycardia, HR 90-120s - Personally Reviewed  ECG    N/A  Physical Exam   GEN: No acute distress.   Neck: No JVD Cardiac: RRR, no murmurs, rubs, or gallops.  Respiratory: Clear to auscultation bilaterally. GI: Soft, nontender, non-distended  MS: No edema; No deformity. Neuro:  Nonfocal  Psych: Normal affect   Labs    High Sensitivity Troponin:   Recent Labs  Lab 12/26/21 1658  TROPONINIHS 12     Chemistry Recent Labs  Lab 12/31/21 0328 01/02/22 0458 01/02/22 1219 01/03/22 0452 01/04/22 0541  NA 136 132*  --  132* 135  K 4.5 5.7* 4.8 4.4 4.0  CL 104 98  --  98 102  CO2 25 24  --  27 26  GLUCOSE 114* 104*  --  141* 119*  BUN 13 14  --  14 15  CREATININE 0.45 0.50  --  0.45 0.43*  CALCIUM 8.0* 8.4*  --  8.2* 8.2*  MG 1.9 2.1  --  1.9 1.9  PROT 6.5  --   --  7.3 7.4  ALBUMIN 2.1*  --   --  2.2* 2.2*  AST 18  --   --  45* 56*  ALT 12  --   --  39 50*  ALKPHOS 86  --   --  156* 179*  BILITOT 0.4  --   --  0.6 0.7  GFRNONAA >60 >60  --  >60 >60  ANIONGAP 7 10  --  7 7    Lipids  Recent Labs  Lab 01/03/22 0452  TRIG 65    Hematology Recent Labs  Lab 01/02/22 0458 01/03/22 0452 01/04/22 0541  WBC 14.1* 13.7* 11.8*  RBC 3.76* 3.18* 3.26*  HGB 9.8* 8.3* 8.4*  HCT 28.8* 23.9* 24.6*  MCV 76.6* 75.2* 75.5*  MCH 26.1 26.1 25.8*  MCHC 34.0 34.7 34.1  RDW 18.0* 17.3* 18.0*  PLT 262 300 311   Thyroid  Recent Labs  Lab 01/01/22 0335  TSH 5.848*    BNPNo results for input(s): "BNP", "PROBNP" in the last 168 hours.  DDimer No results for input(s): "DDIMER" in the last 168 hours.   Radiology    No results found.  Cardiac Studies   ECHO: 12/26/2021  1. Left ventricular ejection fraction, by estimation, is 30 to 35%. The  left ventricle has moderately decreased function. The left ventricle  demonstrates global hypokinesis. Left ventricular diastolic parameters are  consistent with Grade I diastolic dysfunction  (impaired relaxation).   2. Right ventricular systolic function is normal. The right ventricular  size is normal.   3. The mitral valve is normal in structure. No evidence of mitral valve  regurgitation. No evidence of mitral stenosis.   4. The aortic valve is calcified. There is mild calcification of the  aortic valve. There is mild thickening of the aortic valve. Aortic valve  regurgitation is not visualized. Aortic valve sclerosis is present, with  no evidence of aortic valve stenosis.   5. The inferior vena cava is normal in size with greater than 50%  respiratory variability, suggesting right atrial pressure of 3 mmHg.    [place] LOWER EXTREMITY - DVT STUDY 12/26/2021 Comparison Study: 07/25/2016 - - Findings consistent with acute deep vein  thrombosis involving the right common femoral vein, right profunda femoris vein, right femoral vein, right popliteal vein, right posterial  tibial vein, and right peroneal vein.   Patient Profile     66 y.o. 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 Cardiology is asked for evaluation of tachycardia and reviewed use LV function.  Assessment & Plan    1.  New systolic heart failure -Echocardiogram this admission with LV function of 30 to 35% and grade 1 diastolic dysfunction.  EF was normal in 2016.  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 -Plan to add guideline directed medical therapy once taking p.o. -Continue IV metoprolol 5 mg every 6 hours  2.  Tachycardia -Symptoms multi factorial from underlying cardiomyopathy and metabolic condition as well as acute illness. -Continue IV metoprolol as above  3.  DVT Patient was on Xarelto PTA for chronic DVT.  Findings of acute DVTs patient admission.  On heparin >> switched to  p.o. when able.   Can follow peripherally over weekend.   For questions or updates, please contact [place] Please consult [email or web address] for contact info under        Signed, Katrina Kail, PA  01/04/2022, 8:24 AM     I have seen and examined the patient  along with Katrina Kail, PA .  I have reviewed the chart, notes and new data.  I agree with PA/NP's note.   PLAN: We have been unable to make any progress with treatment of her cardiomyopathy since she is unable to take p.o. medications.  Note plan for possible core track placement.  Please reconsult cardiology over the weekend if there are new developments, otherwise we will plan to resume follow-up on Monday.  We will order a follow-up limited echo on Monday.  Katrina Klein, MD, [place] [phone] 01/04/2022, 12:41 PM

## 2022-01-04 NOTE — Progress Notes (Signed)
Progress Note: General Surgery Service   Chief Complaint/Subjective: Denies abd pain.  Chief complaint is pain over decubitus ulcers, asking to be repositioned in bed.  Denies nausea or vomiting.  States she is not eating much.  Denies nausea or vomiting  Objective: Vital signs in last 24 hours: Temp:  [98.4 F (36.9 C)-98.7 F (37.1 C)] 98.7 F (37.1 C) (07/07 0519) Pulse Rate:  [98-115] 102 (07/07 0519) Resp:  [14-18] 18 (07/07 0519) BP: (99-127)/(56-93) 105/68 (07/07 0519) SpO2:  [96 %-99 %] 98 % (07/07 0519) Last BM Date : 01/01/22  Intake/Output from previous day: 07/06 0701 - 07/07 0700 In: 1777.1 [P.O.:170; I.V.:1607.1] Out: 1600 [Urine:1600] Intake/Output this shift: No intake/output data recorded.  GI: Abd Soft, incision c/d/I w/ dressing, nontender.  Lab Results: CBC  Recent Labs    01/03/22 0452 01/04/22 0541  WBC 13.7* 11.8*  HGB 8.3* 8.4*  HCT 23.9* 24.6*  PLT 300 311   BMET Recent Labs    01/03/22 0452 01/04/22 0541  NA 132* 135  K 4.4 4.0  CL 98 102  CO2 27 26  GLUCOSE 141* 119*  BUN 14 15  CREATININE 0.45 0.43*  CALCIUM 8.2* 8.2*   PT/INR No results for input(s): "LABPROT", "INR" in the last 72 hours. ABG No results for input(s): "PHART", "HCO3" in the last 72 hours.  Invalid input(s): "PCO2", "PO2"  Anti-infectives: Anti-infectives (From admission, onward)    Start     Dose/Rate Route Frequency Ordered Stop   12/28/21 1030  ceFAZolin (ANCEF) IVPB 2g/100 mL premix        2 g 200 mL/hr over 30 Minutes Intravenous  Once 12/28/21 1021 12/28/21 1156   12/28/21 1019  ceFAZolin (ANCEF) 2-4 GM/100ML-% IVPB       Note to Pharmacy: Christell Faith L: cabinet override      12/28/21 1019 12/28/21 1154       Medications: Scheduled Meds:  acetaminophen  1,000 mg Oral Q6H   bisacodyl  10 mg Rectal Daily   Chlorhexidine Gluconate Cloth  6 each Topical Daily   feeding supplement  237 mL Oral TID WC   fluticasone  1 spray Each Nare BID    insulin aspart  0-9 Units Subcutaneous Q8H   levothyroxine  100 mcg Intravenous Daily   lip balm   Topical BID   methocarbamol  500 mg Oral Q6H   metoprolol tartrate  5 mg Intravenous Q6H   pantoprazole (PROTONIX) IV  40 mg Intravenous Q12H   sodium chloride flush  10-40 mL Intracatheter Q12H   Continuous Infusions:  dextrose 5 % and 0.45% NaCl 10 mL/hr at 01/03/22 0332   heparin 1,300 Units/hr (01/04/22 0838)   TPN ADULT (ION) 40 mL/hr at 01/03/22 1803   PRN Meds:.albuterol, alum & mag hydroxide-simeth, azelastine, diphenhydrAMINE, hydrALAZINE, hydrocortisone, lip balm, magic mouthwash, menthol-cetylpyridinium, metoprolol tartrate, morphine injection, ondansetron **OR** ondansetron (ZOFRAN) IV, oxyCODONE, phenol, polyvinyl alcohol, simethicone, sodium chloride, sodium chloride flush  Assessment/Plan: Katrina Donaldson is a 66 year old female who underwent exploratory laparotomy with lysis of adhesions that were causing an adhesive small bowel obstruction on 12/28/21.   - During surgery, a small mesenteric nodule was sent for permanent section - path is pending. - afebrile, VSS, WBC downtrending (13.7) -Allow regular diet and see if having more dietary choices increases her oral intake.  I asked the patient if she would be open to placement of a cortrak for supplemental tube feeds and she declined.  Okay to discontinue TPN from a  surgical perspective, defer to primary team.   FEN: Regular ID: none VTE: SCD's, hep gtt for hx DVT/hypercoagulable state Foley: removed 7/3, external cath in place Dispo: med- surg, TRH service.   Hiatal hernia Cardiomyopathy, new acute systolic heart dysfunction - cards following  PMH DVT - acute vs chronic DVT visualized on RLE doppler 12/26/21    LOS: 15 days    Jill Alexanders, [facility] Use [email or web address] to contact on call provider  Daily Billing: [phone] - post op

## 2022-01-05 DIAGNOSIS — D509 Iron deficiency anemia, unspecified: Secondary | ICD-10-CM | POA: Diagnosis not present

## 2022-01-05 DIAGNOSIS — I10 Essential (primary) hypertension: Secondary | ICD-10-CM | POA: Diagnosis not present

## 2022-01-05 DIAGNOSIS — I428 Other cardiomyopathies: Secondary | ICD-10-CM

## 2022-01-05 DIAGNOSIS — Z7901 Long term (current) use of anticoagulants: Secondary | ICD-10-CM | POA: Diagnosis not present

## 2022-01-05 DIAGNOSIS — K56609 Unspecified intestinal obstruction, unspecified as to partial versus complete obstruction: Secondary | ICD-10-CM | POA: Diagnosis not present

## 2022-01-05 LAB — CBC WITH DIFFERENTIAL/PLATELET
Abs Immature Granulocytes: 0.12 10*3/uL — ABNORMAL HIGH (ref 0.00–0.07)
Basophils Absolute: 0.1 10*3/uL (ref 0.0–0.1)
Basophils Relative: 1 %
Eosinophils Absolute: 0.3 10*3/uL (ref 0.0–0.5)
Eosinophils Relative: 2 %
HCT: 22.1 % — ABNORMAL LOW (ref 36.0–46.0)
Hemoglobin: 7.8 g/dL — ABNORMAL LOW (ref 12.0–15.0)
Immature Granulocytes: 1 %
Lymphocytes Relative: 9 %
Lymphs Abs: 1.1 10*3/uL (ref 0.7–4.0)
MCH: 26.6 pg (ref 26.0–34.0)
MCHC: 35.3 g/dL (ref 30.0–36.0)
MCV: 75.4 fL — ABNORMAL LOW (ref 80.0–100.0)
Monocytes Absolute: 0.7 10*3/uL (ref 0.1–1.0)
Monocytes Relative: 6 %
Neutro Abs: 9.4 10*3/uL — ABNORMAL HIGH (ref 1.7–7.7)
Neutrophils Relative %: 81 %
Platelets: 318 10*3/uL (ref 150–400)
RBC: 2.93 MIL/uL — ABNORMAL LOW (ref 3.87–5.11)
RDW: 18.2 % — ABNORMAL HIGH (ref 11.5–15.5)
WBC: 11.6 10*3/uL — ABNORMAL HIGH (ref 4.0–10.5)
nRBC: 0 % (ref 0.0–0.2)

## 2022-01-05 LAB — GLUCOSE, CAPILLARY
Glucose-Capillary: 123 mg/dL — ABNORMAL HIGH (ref 70–99)
Glucose-Capillary: 116 mg/dL — ABNORMAL HIGH (ref 70–99)

## 2022-01-05 MED ORDER — TRAVASOL 10 % IV SOLN
INTRAVENOUS | Status: AC
  Filled 2022-01-05: qty 451.2

## 2022-01-05 NOTE — Progress Notes (Signed)
PHARMACY - TOTAL PARENTERAL NUTRITION CONSULT NOTE   Indication: Small bowel obstruction  Patient Measurements: Height: '5\' 6"'$  (167.6 cm) Weight: 84.1 kg (185 lb 6.5 oz) IBW/kg (Calculated) : 59.3 TPN AdjBW (KG): 65.5 Body mass index is 29.93 kg/m.  Assessment:  66 yo F presents with abdominal pain, n/v, and poor appetite x 5 days. 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 No BM for 5 days as well. NG output was 1.6 L and xray shows SBO. Does report shellfish allergy but not any fish. Reacted to shrimp but not crab and was not an anaphylactic reaction. Pharmacy consulted for TPN.  Glucose / Insulin: No hx of DM. CBGs <180. 1 unit of SSI / 24 hrs. - 6/30 Dexamethasone '4mg'$   Electrolytes: No new BMET today.  Lytes ok & Na borderline low (7/7). Renal: SCr WNL.   Hepatic:  LFTs slightly elevated. Tbili WNL. TG wnl. Albumin low at 2.2 (7/7). Intake / Output; MIVF: UOP ok - PO Intake 350 mL - D5 1/2NS at [place] / Procedures:  6/30 Exploratory laparotomy and lysis of adhesions.   Central access: PICC placed 6/29 TPN start date: 6/29  Nutritional Goals: Goal TPN rate is 80 mL/hr (provides 90 g of protein and 2,085 kcals per day)  RD Assessment: Estimated Needs Total Energy Estimated Needs: 1950-2150 Total Protein Estimated Needs: 85-95g Total Fluid Estimated Needs: 2L/day  Current Nutrition:  TPN  Changed to regular diet yesterday but poor appetite Ensure Enlive ordered TID (refused all on 7/6 & 7/7)  Plan:  At 18:00:  Continue TPN at half rate of 87m/hr; Discussed with primary. Electrolytes in TPN: Na 1034m/L, K 4067mL, Ca 5mE28m, Mg 7mEq20m and Phos 10mmo71m Cl:Ac 1:1 Add standard MVI and trace elements to TPN Sensitive SSI to Q8h and adjust as needed  Continue D[facility]NS [facility] on Mon/Thurs, BMET In am F/U if regular diet tolerated to consider weaning off TPN  Thank you for allowing pharmacy to be a part of this  patient's care.  MelissRoyetta AsalmD, BCPS Clinical Pharmacist [place] Please utilize Amion for appropriate phone number to reach the unit pharmacist ([place] [email or web address] 9:42 AM

## 2022-01-05 NOTE — Progress Notes (Signed)
PROGRESS NOTE    Katrina Donaldson  JAS:[id number] DOB: 08/15/1955 DOA: 12/20/2021 PCP: Donald Prose, MD   Brief Narrative:  66 y.o.f w/ history significant of laparoscopic partial colectomy in 01/2021 2/2 cecal mass, thyroidectomy, brain aneurysm, chronic anticoagulation, hypertension, hypothyroidism, CVA, sacral osteomyelitis, wheelchair-bound presented from SNF complaining of abdominal pain, nausea, vomiting, poor appetite  x 5 days, last BM 5 days PTA. In the ED, vital signs noted for tachycardia otherwise fairly stable, labs noted for mild anemia, otherwise stable.  CT abdomen/pelvis showed high-grade SBO likely due to adhesions, large hiatal hernia.  Surgery consulted and patient admitted for further management  Patient has failed to progress despite having NG tube decompression IV fluid hydration. She was noted to have tachycardia abnormal EKG, no chest pain troponins are negative, EKG shows sinus tachycardia. Echocardiogram showed decreased EF 30-35%. Labs with improved/stable renal function, stable hemoglobin at 10.2 g.  Seen by cardiology.  Sbo did not improve subsequently underwent ex laparotomy with lysis of adhesion by Dr Barry Dienes 6/30.patient continued on NG decompression and clamping trial-ng fell off 7/4-keeping it off.  Awaiting return of bowel function   Assessment & Plan:   Principal Problem:   SBO (small bowel obstruction) (HCC) Active Problems:   Essential hypertension   Anemia, iron deficiency   Hypothyroidism   Hypercoagulable state ([place])   Hypokalemia   Confusion   Chronic anticoagulation   Cecum mass   Sacral osteomyelitis (HCC)   Pressure injury of sacral region, stage 4 (HCC)   AKI (acute kidney injury) ([place])   Pressure ulcer of sacral region, stage 3 ([place])   Cardiomyopathy ([place])   Bedridden   Incarcerated hiatal hernia     SBO secondary to adhesions: s/p ex laparotomy with lysis of adhesion by Dr Barry Dienes 6/30, after it did not improve on conservative  management.continue TPN-although at reduced rate as diet advances, but will continue for another day as pt has had minimal po intake.  Gen surg did liberlaize diet to encourage po intake Continue remaining plan of care as per surgical service Continue pain control DVT prophylaxis and ambulation PT OT.  Follow-up pathology on Biopsy from mesenteric nodule.     Large hiatal hernia: on PPI twice daily Leukocytosis postop: WBC count now downtrending remains afebrile, no UTI.  Foley off.  Monitor Hypernatremia w/ Hyperchloremia:Resolved  Hypokalemia resolved  Hyperkalemia-resolved 4.4   Cardiomyopathy w/ new acute systolic dysfunction with EF 30 to 35% Essential hypertension Sinus tachycardia: Likely tachycardia mediated acute systolic dysfunction in the setting of acute illness. Cardiology following, on metoprolol 5 mg scheduled/prn, monitor in tele.She will need repeat echo once acute illness resolves and outpatient CHF follow-up.  CHF is compensated.  Continue to monitor intake output, watch for fluid overload- noted some wt changes-if symptomatic add lasix, f/up Cardio recs today if anything needs to be adjusted for tachycardia .Net IO Since Admission: 5,665.8 mL [01/02/22 0905]    Normocytic anemia Iron deficiency Anemia of chronic disease:  Hgb  8.4  Transfuse if less than 7 g.  GERD:cont IV PPI. ALP:FXTKWI on hold. Fever-several days ago, no recurrence/resolved Acute urine retention-resolved. Off foley 7/3 Hypothyroidism: cont IV Synthroid OXB:[id number]. Hypophosphatemia resolved.monitor.   History of DVT starting in 2005//Hypercoagulable state Chronic DVT ON [place] 6/28: Xarelto on hold-remains on heparin drip-dosing/monitoring per pharmacy   Goals of care full code, prognosis remains to be seen given patient's acute multiple medical problems including bowel obstruction and acute systolic dysfunction.   Stage III pressure ulcer on sacrum POA:  Continue wound care and offloading. Ext  foley        Pressure Injury 12/20/21 Sacrum Posterior Stage 3 -  Full thickness tissue loss. Subcutaneous fat may be visible but bone, tendon or muscle are NOT exposed. Red (Active)  12/20/21 1950  Location:Sacrum  Location Orientation:Posterior  Staging:Stage 3-Full thickness tissue loss. Subcutaneous fat may be visible but bone, tendon or muscle are NOT exposed.  Wound Description (Comments):Red  Present on Admission:Yes  Dressing Type Foam:Lift dressing to assess site every shift.     DVT prophylaxis: VV:OHYWVPX   Code Status: full    Code Status Orders  (From admission, onward)           Start     Ordered   12/20/21 1811  Full code  Continuous        12/20/21 1810           Code Status History     Date Active Date Inactive Code Status Order ID Comments User Context   01/26/2021 2309 02/14/2021 0818 Full Code [id number]  Kayleen Memos, DO ED   06/03/2017 2108 06/05/2017 2129 Full Code [id number]  Consuella Lose, MD Inpatient   07/24/2016 1847 08/24/2016 1901 Full Code [id number]  Cathlyn Parsons, PA-C Inpatient   07/24/2016 1847 07/24/2016 1847 Full Code [id number]  Cathlyn Parsons, PA-C Inpatient   07/19/2016 2034 07/24/2016 1835 Full Code [id number]  Consuella Lose, MD Inpatient   07/21/2015 1026 07/22/2015 0326 Full Code [id number]  Consuella Lose, MD HOV      Advance Directive Documentation    Flowsheet Row Most Recent Value  Type of Advance Directive Healthcare Power of Attorney  Pre-existing out of facility DNR order (yellow form or pink MOST form) --  "MOST" Form in Place? --      Family Communication: Tried calling daughter no answer left message Disposition Plan:    Patient not stable for discharge.  Still requiring IV nutrition with TPN Consults called: None Admission status: Inpatient   Consultants:  Gen surg  Procedures:  DG CHEST PORT 1 VIEW  Result Date: 12/31/2021 CLINICAL DATA:  Leukocytosis EXAM: PORTABLE CHEST 1 VIEW COMPARISON:   12/21/2021 FINDINGS: Patient rotated to the right. Nasogastric tube terminates in the herniated stomach with side port likely just below the gastroesophageal junction. No pleural effusion or pneumothorax. Large hiatal hernia with abdominal organs positioned in the lower left hemithorax. No congestive failure. Volume loss in the left lung base. Clear right lung. Right PICC line terminates at the superior caval/atrial junction. IMPRESSION: Large hiatal hernia. Volume loss within the adjacent left lung base, similar. Nasogastric tube currently positioned within the herniated stomach. Cardiomegaly without congestive failure. Electronically Signed   By: Abigail Miyamoto M.D.   On: 12/31/2021 09:21   VAS [place] LOWER EXTREMITY VENOUS (DVT)  Result Date: 12/26/2021  Lower Venous DVT Study Patient Name:  JULIUS BONIFACE  Date of Exam:   12/26/2021 Medical Rec #: [id number]        Accession #:    [id number] Date of Birth: 1955/12/01        Patient Gender: F Patient Age:   87 years Exam Location:  [facility] Procedure:      VAS [place] LOWER EXTREMITY VENOUS (DVT) Referring Phys: Morton --------------------------------------------------------------------------------  Indications: Swelling.  Risk Factors: DVT. Limitations: Poor ultrasound/tissue interface and patient positioning, patient immobility, patient pain tolerance. Comparison Study: 07/25/2016 - - Findings consistent with acute deep vein  thrombosis involving the                   right common femoral vein, right profunda femoris vein, right                   femoral vein, right popliteal vein, right posterial tibial                   vein,                   and right peroneal vein.                   - Findings consistent with acute superficial vein thrombosis                   involving the right small saphenous vein.                   - No evidence of deep vein thrombosis involving the left lower                   extremity.                   - No  evidence of Baker&'s cyst on the right or left. Performing Technologist: Oliver Hum RVT  Examination Guidelines: A complete evaluation includes B-mode imaging, spectral Doppler, color Doppler, and power Doppler as needed of all accessible portions of each vessel. Bilateral testing is considered an integral part of a complete examination. Limited examinations for reoccurring indications may be performed as noted. The reflux portion of the exam is performed with the patient in reverse Trendelenburg.  +---------+---------------+---------+-----------+----------+--------------+ RIGHT    CompressibilityPhasicitySpontaneityPropertiesThrombus Aging +---------+---------------+---------+-----------+----------+--------------+ CFV      Partial        Yes      Yes                  Chronic        +---------+---------------+---------+-----------+----------+--------------+ SFJ      Full                                                        +---------+---------------+---------+-----------+----------+--------------+ FV Prox  Full                                                        +---------+---------------+---------+-----------+----------+--------------+ FV Mid                  Yes      Yes                                 +---------+---------------+---------+-----------+----------+--------------+ FV DistalFull                                                        +---------+---------------+---------+-----------+----------+--------------+ PFV      Full                                                        +---------+---------------+---------+-----------+----------+--------------+  POP      Full           Yes      Yes                                 +---------+---------------+---------+-----------+----------+--------------+ PTV      Full                                                         +---------+---------------+---------+-----------+----------+--------------+ PERO     Full                                                        +---------+---------------+---------+-----------+----------+--------------+   +---------+---------------+---------+-----------+----------+-------------------+ LEFT     CompressibilityPhasicitySpontaneityPropertiesThrombus Aging      +---------+---------------+---------+-----------+----------+-------------------+ CFV      Full           Yes      Yes                                      +---------+---------------+---------+-----------+----------+-------------------+ SFJ      Full                                                             +---------+---------------+---------+-----------+----------+-------------------+ FV Prox  Full                                                             +---------+---------------+---------+-----------+----------+-------------------+ FV Mid   Full                                                             +---------+---------------+---------+-----------+----------+-------------------+ FV Distal               Yes      Yes                                      +---------+---------------+---------+-----------+----------+-------------------+ PFV      Full                                                             +---------+---------------+---------+-----------+----------+-------------------+ POP      Full  Yes      Yes                                      +---------+---------------+---------+-----------+----------+-------------------+ PTV      Full                                                             +---------+---------------+---------+-----------+----------+-------------------+ PERO                                                  Not well visualized +---------+---------------+---------+-----------+----------+-------------------+     Summary:  RIGHT: - 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 - No cystic structure found in the popliteal fossa.  LEFT: - There is no evidence of deep vein thrombosis in the lower extremity. However, portions of this examination were limited- see technologist comments above.  - No cystic structure found in the popliteal fossa.  *See table(s) above for measurements and observations. Electronically signed by Orlie Pollen on 12/26/2021 at 5:31:11 PM.    Final    ECHOCARDIOGRAM COMPLETE  Result Date: 12/26/2021    ECHOCARDIOGRAM REPORT   Patient Name:   Katrina Donaldson Date of Exam: 12/26/2021 Medical Rec #:  [id number]       Height:       66.0 in Accession #:    [id number]      Weight:       177.2 lb Date of Birth:  [birth year]       BSA:          1.900 m Patient Age:    56 years        BP:           125/82 mmHg Patient Gender: F               HR:           120 bpm. Exam Location:  Inpatient Procedure: 2D Echo, Color Doppler and Cardiac Doppler Indications:    Abnormal ekg  History:        Patient has no prior history of Echocardiogram examinations.  Sonographer:    Joette Catching RCS Referring Phys: [id number] [place]  Sonographer Comments: Technically challenging study due to limited acoustic windows. Supine scan. IMPRESSIONS  1. Left ventricular ejection fraction, by estimation, is 30 to 35%. The left ventricle has moderately decreased function. The left ventricle demonstrates global hypokinesis. Left ventricular diastolic parameters are consistent with Grade I diastolic dysfunction (impaired relaxation).  2. Right ventricular systolic function is normal. The right ventricular size is normal.  3. The mitral valve is normal in structure. No evidence of mitral valve regurgitation. No evidence of mitral stenosis.  4. The aortic valve is calcified. There is mild calcification of the aortic valve. There is mild thickening of the aortic valve. Aortic valve regurgitation is not  visualized. Aortic valve sclerosis is present, with no evidence of aortic valve stenosis.  5. The inferior vena cava is normal in size with greater than 50% respiratory variability, suggesting right atrial pressure of 3 mmHg.  FINDINGS  Left Ventricle: Left ventricular ejection fraction, by estimation, is 30 to 35%. The left ventricle has moderately decreased function. The left ventricle demonstrates global hypokinesis. The left ventricular internal cavity size was normal in size. There is no left ventricular hypertrophy. Abnormal (paradoxical) septal motion, consistent with left bundle branch block. Left ventricular diastolic parameters are consistent with Grade I diastolic dysfunction (impaired relaxation). Right Ventricle: The right ventricular size is normal. No increase in right ventricular wall thickness. Right ventricular systolic function is normal. Left Atrium: Left atrial size was normal in size. Right Atrium: Right atrial size was normal in size. Pericardium: There is no evidence of pericardial effusion. Mitral Valve: The mitral valve is normal in structure. No evidence of mitral valve regurgitation. No evidence of mitral valve stenosis. Tricuspid Valve: The tricuspid valve is normal in structure. Tricuspid valve regurgitation is not demonstrated. No evidence of tricuspid stenosis. Aortic Valve: The aortic valve is calcified. There is mild calcification of the aortic valve. There is mild thickening of the aortic valve. Aortic valve regurgitation is not visualized. Aortic valve sclerosis is present, with no evidence of aortic valve stenosis. Aortic valve mean gradient measures 4.0 mmHg. Aortic valve peak gradient measures 6.2 mmHg. Aortic valve area, by VTI measures 2.26 cm. Pulmonic Valve: The pulmonic valve was normal in structure. Pulmonic valve regurgitation is not visualized. No evidence of pulmonic stenosis. Aorta: The aortic root is normal in size and structure. Venous: The inferior vena cava is  normal in size with greater than 50% respiratory variability, suggesting right atrial pressure of 3 mmHg. IAS/Shunts: No atrial level shunt detected by color flow Doppler.  LEFT VENTRICLE PLAX 2D LVIDd:         3.80 cm   Diastology LVIDs:         3.30 cm   LV e' medial:    5.22 cm/s LV PW:         1.00 cm   LV E/e' medial:  10.0 LV IVS:        0.90 cm   LV e' lateral:   5.77 cm/s LVOT diam:     2.30 cm   LV E/e' lateral: 9.1 LV SV:         30 LV SV Index:   16 LVOT Area:     4.15 cm  RIGHT VENTRICLE             IVC RV S prime:     29.70 cm/s  IVC diam: 1.70 cm TAPSE (M-mode): 2.1 cm LEFT ATRIUM             Index LA diam:        2.10 cm 1.11 cm/m LA Vol (A2C):   20.0 ml 10.53 ml/m LA Vol (A4C):   57.6 ml 30.32 ml/m LA Biplane Vol: 35.8 ml 18.84 ml/m  AORTIC VALVE                    PULMONIC VALVE AV Area (Vmax):    2.08 cm     PV Vmax:          1.15 m/s AV Area (Vmean):   2.10 cm     PV Peak grad:     5.3 mmHg AV Area (VTI):     2.26 cm     PR End Diast Vel: 10.76 msec AV Vmax:           125.00 cm/s AV Vmean:          94.000 cm/s  AV VTI:            0.134 m AV Peak Grad:      6.2 mmHg AV Mean Grad:      4.0 mmHg LVOT Vmax:         62.70 cm/s LVOT Vmean:        47.600 cm/s LVOT VTI:          0.073 m LVOT/AV VTI ratio: 0.54  AORTA Ao Root diam: 3.40 cm Ao Asc diam:  3.30 cm MITRAL VALVE               TRICUSPID VALVE MV Area (PHT): 5.84 cm    TR Peak grad:   25.0 mmHg MV Decel Time: 130 msec    TR Vmax:        250.00 cm/s MV E velocity: 52.30 cm/s MV A velocity: 78.40 cm/s  SHUNTS MV E/A ratio:  0.67        Systemic VTI:  0.07 m                            Systemic Diam: 2.30 cm Candee Furbish MD Electronically signed by Candee Furbish MD Signature Date/Time: 12/26/2021/5:05:24 PM    Final    [place] EKG SITE RITE  Result Date: 12/26/2021 If Site Rite image not attached, placement could not be confirmed due to current cardiac rhythm.  DG Abd Portable 1V  Result Date: 12/26/2021 CLINICAL DATA:  Small-bowel obstruction  EXAM: PORTABLE ABDOMEN - 1 VIEW COMPARISON:  12/25/2021 FINDINGS: Persistent dilated loops of small bowel particularly in the left abdomen. Maximum measurable distension is decreased. Calcified uterine fibroid is again noted. IMPRESSION: Persistent small bowel obstruction. Maximum distension measures decreased. Electronically Signed   By: Macy Mis M.D.   On: 12/26/2021 08:24   DG Abd Portable 1V  Result Date: 12/25/2021 CLINICAL DATA:  Small bowel obstruction. EXAM: PORTABLE ABDOMEN - 1 VIEW COMPARISON:  Abdominal x-ray from yesterday. FINDINGS: Enteric tube incompletely visualized looped in the left chest, presumably within the large hiatal hernia as seen on CT. Multiple dilated loops of small bowel are unchanged. Calcified uterine fibroid again noted. No acute osseous abnormality. IMPRESSION: 1. Unchanged small bowel obstruction. Electronically Signed   By: Titus Dubin M.D.   On: 12/25/2021 10:31   DG Abd 1 View  Result Date: 12/24/2021 CLINICAL DATA:  None hour delayed film.  Small bowel obstruction. EXAM: ABDOMEN - 1 VIEW COMPARISON:  12/23/2021 FINDINGS: Continued small bowel dilatation compatible with small bowel obstruction. Likely not significantly changed. No visible free air. Large calcified fibroid in the right side of the pelvis. NG tube is not visualized. This likely projects off the superior aspect of the image as the upper abdomen is not included on this single image. IMPRESSION: Continued small bowel obstruction pattern, not significantly changed. Electronically Signed   By: Rolm Baptise M.D.   On: 12/24/2021 23:14   DG Abd Portable 1V  Result Date: 12/23/2021 CLINICAL DATA:  Nasogastric tube placement. Small-bowel obstruction. EXAM: PORTABLE ABDOMEN - 1 VIEW COMPARISON:  Prior today FINDINGS: A nasogastric tube is now seen which appears coiled within a hiatal hernia. Moderately dilated small bowel loops show mild decrease in dilatation since prior exam. IMPRESSION: Nasogastric  tube appears coiled within a hiatal hernia. Mildly decreased dilatation of small bowel loops since prior exam. Electronically Signed   By: Marlaine Hind M.D.   On: 12/23/2021 16:27   DG Abd Portable 1V-Small  Bowel Obstruction Protocol-24 hr delay  Result Date: 12/23/2021 CLINICAL DATA:  Small bowel obstruction protocol, 24 hour delayed image. EXAM: PORTABLE ABDOMEN - 1 VIEW COMPARISON:  Abdominal x-rays 12/22/2021. FINDINGS: Contrast material remains in small bowel loops in the left lower quadrant. No colonic contrast identified. Small bowel loops are air-filled and dilated measuring up to 6.7 cm, similar to the prior study. Calcified uterine fibroid again noted. IMPRESSION: 1. Contrast material remains in small bowel loops in the left lower quadrant. Dilated small bowel is unchanged. Findings are compatible with small-bowel obstruction. Electronically Signed   By: Ronney Asters M.D.   On: 12/23/2021 15:27   DG Abd Portable 1V-Small Bowel Obstruction Protocol-initial, 8 hr delay  Result Date: 12/22/2021 CLINICAL DATA:  8 hour small-bowel follow up EXAM: PORTABLE ABDOMEN - 1 VIEW COMPARISON:  Film from earlier in the same day. FINDINGS: Multiple loops of dilated small bowel are again identified. Previously administered contrast lies predominately within the mid to distal small bowel. No colonic contrast is noted. 24 hour follow-up is recommended. Calcified uterine fibroid is seen. IMPRESSION: Administered contrast lies within the dilated small bowel. No colonic contrast is seen. 24 hour follow-up is recommended. Electronically Signed   By: Inez Catalina M.D.   On: 12/22/2021 22:11   DG Abd Portable 1V  Result Date: 12/22/2021 CLINICAL DATA:  Small-bowel obstruction EXAM: PORTABLE ABDOMEN - 1 VIEW COMPARISON:  12/20/2021 FINDINGS: NG tube in the stomach with the tip near the antrum. Dilated small bowel loops similar to the prior study compatible with small bowel obstruction. Colon decompressed. Calcified  uterine fibroid in the right pelvis. IMPRESSION: NG tube in the gastric antrum.  Small bowel obstruction unchanged. Electronically Signed   By: Franchot Gallo M.D.   On: 12/22/2021 11:29   DG Chest Port 1 View  Result Date: 12/21/2021 CLINICAL DATA:  [id number] fever EXAM: PORTABLE CHEST 1 VIEW COMPARISON:  Chest x-ray from yesterday nausea correlation is made with a CT of the abdomen and pelvis from yesterday FINDINGS: There has been interval insertion of an NG tube with its distal end at the right upper-mid abdomen. There has been interval partial decompression of the dilated stomach at the left lower thorax. Large hiatal hernia containing the stomach extending up to the left mid thorax. Left basilar atelectasis. Cardiomegaly. Right lung remains clear. Moderately severe dilatation of the small bowel loops of small bowel obstruction. IMPRESSION: There has been interval insertion of an NG tube with its distal end of the right upper-mid abdomen. There has been interval partial decompression of the herniated stomach through the hiatal hernia extending up to the mid left hemithorax. Left basilar atelectasis. Findings of high-grade small-bowel obstruction. Electronically Signed   By: Frazier Richards M.D.   On: 12/21/2021 08:57   DG Abd Portable 1V-Small Bowel Protocol-Position Verification  Result Date: 12/20/2021 CLINICAL DATA:  NG tube placement EXAM: PORTABLE ABDOMEN - 1 VIEW COMPARISON:  CT 12/20/2021 FINDINGS: Elevated left diaphragm with large hiatal hernia and intrathoracic stomach. Esophageal tube tip is below the diaphragm and probably in the region of gastroduodenal junction. Multiple loops of dilated small bowel consistent with an obstruction IMPRESSION: 1. Esophageal tube tip likely over the gastroduodenal junction 2. Elevated left diaphragm with large hiatal hernia and intrathoracic stomach. Subsegmental atelectasis at the left base. 3. Multiple dilated loops of small bowel consistent with an obstruction  Electronically Signed   By: Donavan Foil M.D.   On: 12/20/2021 23:24   CT ABDOMEN PELVIS W CONTRAST  Result Date: 12/20/2021 CLINICAL DATA:  Abdominal pain, acute, nonlocalized n/v, no BM x5 days, diffuse abd pain lower EXAM: CT ABDOMEN AND PELVIS WITH CONTRAST TECHNIQUE: Multidetector CT imaging of the abdomen and pelvis was performed using the standard protocol following bolus administration of intravenous contrast. RADIATION DOSE REDUCTION: This exam was performed according to the departmental dose-optimization program which includes automated exposure control, adjustment of the mA and/or kV according to patient size and/or use of iterative reconstruction technique. CONTRAST:  128m OMNIPAQUE IOHEXOL 300 MG/ML  SOLN COMPARISON:  CT 02/01/2021 FINDINGS: Lower chest: Bibasilar atelectasis. Unchanged elevated left hemidiaphragm. Dilated and fluid-filled visualized esophagus. Hepatobiliary: No focal liver abnormality is seen. Mild gallbladder distension. Pancreas: Unremarkable. No pancreatic ductal dilatation or surrounding inflammatory changes. Spleen: Normal in size without focal abnormality. Adrenals/Urinary Tract: Adrenal glands are unremarkable. No hydronephrosis. There are nonobstructing left lower pole renal stones largest measuring up to 4 mm. The bladder is moderately distended. Stomach/Bowel: Large hiatal hernia with dilated partially intrathoracic stomach. Multiple dilated loops of small bowel with transition point in the right upper abdomen (series 2, image 38, series 6, image 29-30). There is mesenteric edema.Prior right hemicolectomy with ileocolic anastomosis. Vascular/Lymphatic: Scattered atherosclerosis. No AAA. No lymphadenopathy. Reproductive: There is a subserosal or pedunculated calcified uterine fibroid which now appears on the right, likely due to rightward mass effect on the uterus by ascites and previously described small bowel obstruction. Other: Small volume abdominopelvic ascites.  No  free air. Musculoskeletal: No acute osseous abnormality. Unchanged anterolisthesis at L4-L5. Moderate bilateral hip osteoarthritis. 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 Chronic changes at the origin of the left rectus femoris. There is diffuse muscle atrophy. IMPRESSION: High-grade small-bowel obstruction with transition point in the right upper abdomen, likely due to adhesions. Large hiatal hernia with dilated partially intrathoracic stomach related to the SBO. Small volume abdominopelvic ascites. Additional chronic findings as described above. Electronically Signed   By: JMaurine SimmeringM.D.   On: 12/20/2021 15:27   DG Chest Portable 1 View  Result Date: 12/20/2021 CLINICAL DATA:  Abdominal pain and vomiting. Small-bowel obstruction. EXAM: PORTABLE CHEST 1 VIEW COMPARISON:  CT of the abdomen and pelvis 02/01/2021 FINDINGS: The heart is enlarged. Chronic elevation of left hemidiaphragm present. The stomach is within the hernia, dilated. Additional loops of bowel are present within the hernia. Moderate distension of small bowel noted in the abdomen. IMPRESSION: 1. Moderate distension of small bowel compatible with small bowel obstruction or ileus. Dedicated imaging of the abdomen scratched at dedicated radiographs of the abdomen or CT of the abdomen pelvis would be useful for further evaluation. 2. Chronic elevation of left hemidiaphragm/hernia. 3. Cardiomegaly. Electronically Signed   By: CSan MorelleM.D.   On: 12/20/2021 13:03       Subjective: Diet slowly improving still requiring TPN  Objective: Vitals:   01/04/22 0519 01/04/22 1138 01/04/22 2128 01/05/22 0607  BP: 105/68 (!) 123/50 120/77 124/73  Pulse: (!) 102 (!) 102 98 (!) 101  Resp: '18 18 18 18  '$ Temp: 98.7 F (37.1 C) 97.9 F (36.6 C) 98.4 F (36.9 C) 98.7 F (37.1 C)  TempSrc: Oral Oral Oral Oral  SpO2: 98% 100% 95% 97%  Weight:      Height:        Intake/Output Summary (Last 24 hours) at  01/05/2022 1318 Last data filed at 01/05/2022 1200 Gross per 24 hour  Intake 1891.54 ml  Output 600 ml  Net 1291.54 ml   FAutoliv  12/23/21 0600 12/26/21 1230 12/28/21 0949  Weight: 80.4 kg 84.1 kg 84.1 kg    Examination:  General exam: Appears calm and comfortable  Respiratory system: Clear to auscultation. Respiratory effort normal. Cardiovascular system: S1 & S2 heard, RRR. No JVD, murmurs, rubs, gallops or clicks. No pedal edema. Gastrointestinal system: Abdomen is nondistended, soft and nontender. No organomegaly or masses felt.  Quiet bowel sounds Central nervous system: Alert and oriented. No focal neurological deficits. Extremities: Warm well perfused Skin: No rashes, lesions or ulcers Psychiatry: Flat affect, more interactive today    Data Reviewed: I have personally reviewed following labs and imaging studies  CBC: Recent Labs  Lab 01/01/22 0335 01/02/22 0458 01/03/22 0452 01/04/22 0541 01/05/22 0622  WBC 14.2* 14.1* 13.7* 11.8* 11.6*  NEUTROABS 11.0* 11.3* 11.4* 9.7* 9.4*  HGB 8.4* 9.8* 8.3* 8.4* 7.8*  HCT 23.9* 28.8* 23.9* 24.6* 22.1*  MCV 74.0* 76.6* 75.2* 75.5* 75.4*  PLT 273 262 300 311 962   Basic Metabolic Panel: Recent Labs  Lab 12/30/21 0303 12/31/21 0328 01/02/22 0458 01/02/22 1219 01/03/22 0452 01/04/22 0541  NA 137 136 132*  --  132* 135  K 4.1 4.5 5.7* 4.8 4.4 4.0  CL 106 104 98  --  98 102  CO2 '25 25 24  '$ --  27 26  GLUCOSE 120* 114* 104*  --  141* 119*  BUN '14 13 14  '$ --  14 15  CREATININE 0.40* 0.45 0.50  --  0.45 0.43*  CALCIUM 7.8* 8.0* 8.4*  --  8.2* 8.2*  MG 1.8 1.9 2.1  --  1.9 1.9  PHOS 3.1 4.0 4.8*  --  4.5 3.9   GFR: Estimated Creatinine Clearance: 73.5 mL/min (A) (by C-G formula based on SCr of 0.43 mg/dL (L)). Liver Function Tests: Recent Labs  Lab 12/31/21 0328 01/03/22 0452 01/04/22 0541  AST 18 45* 56*  ALT 12 39 50*  ALKPHOS 86 156* 179*  BILITOT 0.4 0.6 0.7  PROT 6.5 7.3 7.4  ALBUMIN 2.1* 2.2* 2.2*    No results for input(s): "LIPASE", "AMYLASE" in the last 168 hours. No results for input(s): "AMMONIA" in the last 168 hours. Coagulation Profile: No results for input(s): "INR", "PROTIME" in the last 168 hours. Cardiac Enzymes: No results for input(s): "CKTOTAL", "CKMB", "CKMBINDEX", "TROPONINI" in the last 168 hours. BNP (last 3 results) No results for input(s): "PROBNP" in the last 8760 hours. HbA1C: No results for input(s): "HGBA1C" in the last 72 hours. CBG: Recent Labs  Lab 01/04/22 1133 01/04/22 1603 01/04/22 1725 01/04/22 2352 01/05/22 0819  GLUCAP 105* 113* 115* 117* 123*   Lipid Profile: Recent Labs    01/03/22 0452  TRIG 65   Thyroid Function Tests: No results for input(s): "TSH", "T4TOTAL", "FREET4", "T3FREE", "THYROIDAB" in the last 72 hours. Anemia Panel: No results for input(s): "VITAMINB12", "FOLATE", "FERRITIN", "TIBC", "IRON", "RETICCTPCT" in the last 72 hours. Sepsis Labs: No results for input(s): "PROCALCITON", "LATICACIDVEN" in the last 168 hours.  No results found for this or any previous visit (from the past 240 hour(s)).       Radiology Studies: No results found.      Scheduled Meds:  acetaminophen  1,000 mg Oral Q6H   bisacodyl  10 mg Rectal Daily   Chlorhexidine Gluconate Cloth  6 each Topical Daily   feeding supplement  237 mL Oral TID WC   fluticasone  1 spray Each Nare BID   insulin aspart  0-9 Units Subcutaneous Q8H   levothyroxine  125  mcg Oral Q0600   lip balm   Topical BID   methocarbamol  500 mg Oral Q6H   metoprolol tartrate  5 mg Intravenous Q6H   pantoprazole  40 mg Oral BID   rivaroxaban  20 mg Oral Q supper   sodium chloride flush  10-40 mL Intracatheter Q12H   Continuous Infusions:  dextrose 5 % and 0.45% NaCl 10 mL/hr at 01/03/22 0332   TPN ADULT (ION) 40 mL/hr at 01/04/22 1730   TPN ADULT (ION)       LOS: 16 days    Time spent: 35 min    Nicolette Bang, MD [facility]  If 7PM-7AM,  please contact night-coverage  01/05/2022, 1:18 PM

## 2022-01-05 NOTE — Progress Notes (Signed)
Progress Note: General Surgery Service   Chief Complaint/Subjective: Denies abd pain.  States she is not eating much.  Denies nausea or vomiting  Objective: Vital signs in last 24 hours: Temp:  [97.9 F (36.6 C)-98.7 F (37.1 C)] 98.7 F (37.1 C) (07/08 0607) Pulse Rate:  [98-102] 101 (07/08 0607) Resp:  [18] 18 (07/08 0607) BP: (120-124)/(50-77) 124/73 (07/08 0607) SpO2:  [95 %-100 %] 97 % (07/08 0607) Last BM Date : 01/01/22  Intake/Output from previous day: 07/07 0701 - 07/08 0700 In: 1069 [P.O.:440; I.V.:629] Out: 300 [Urine:300] Intake/Output this shift: Total I/O In: 240 [P.O.:240] Out: 400 [Urine:400]  GI: Abd Soft, incision c/d/I w/ dressing, nontender.  Lab Results: CBC  Recent Labs    01/04/22 0541 01/05/22 0622  WBC 11.8* 11.6*  HGB 8.4* 7.8*  HCT 24.6* 22.1*  PLT 311 318    BMET Recent Labs    01/03/22 0452 01/04/22 0541  NA 132* 135  K 4.4 4.0  CL 98 102  CO2 27 26  GLUCOSE 141* 119*  BUN 14 15  CREATININE 0.45 0.43*  CALCIUM 8.2* 8.2*    PT/INR No results for input(s): "LABPROT", "INR" in the last 72 hours. ABG No results for input(s): "PHART", "HCO3" in the last 72 hours.  Invalid input(s): "PCO2", "PO2"  Anti-infectives: Anti-infectives (From admission, onward)    Start     Dose/Rate Route Frequency Ordered Stop   12/28/21 1030  ceFAZolin (ANCEF) IVPB 2g/100 mL premix        2 g 200 mL/hr over 30 Minutes Intravenous  Once 12/28/21 1021 12/28/21 1156   12/28/21 1019  ceFAZolin (ANCEF) 2-4 GM/100ML-% IVPB       Note to Pharmacy: Christell Faith L: cabinet override      12/28/21 1019 12/28/21 1154       Medications: Scheduled Meds:  acetaminophen  1,000 mg Oral Q6H   bisacodyl  10 mg Rectal Daily   Chlorhexidine Gluconate Cloth  6 each Topical Daily   feeding supplement  237 mL Oral TID WC   fluticasone  1 spray Each Nare BID   insulin aspart  0-9 Units Subcutaneous Q8H   levothyroxine  125 mcg Oral Q0600   lip balm    Topical BID   methocarbamol  500 mg Oral Q6H   metoprolol tartrate  5 mg Intravenous Q6H   pantoprazole  40 mg Oral BID   rivaroxaban  20 mg Oral Q supper   sodium chloride flush  10-40 mL Intracatheter Q12H   Continuous Infusions:  dextrose 5 % and 0.45% NaCl 10 mL/hr at 01/03/22 0332   TPN ADULT (ION) 40 mL/hr at 01/04/22 1730   TPN ADULT (ION)     PRN Meds:.albuterol, alum & mag hydroxide-simeth, azelastine, diphenhydrAMINE, hydrALAZINE, hydrocortisone, lip balm, magic mouthwash, menthol-cetylpyridinium, metoprolol tartrate, morphine injection, ondansetron **OR** ondansetron (ZOFRAN) IV, oxyCODONE, phenol, polyvinyl alcohol, simethicone, sodium chloride, sodium chloride flush  Assessment/Plan: Ms. Moffat is a 66 year old female who underwent exploratory laparotomy with lysis of adhesions that were causing an adhesive small bowel obstruction on 12/28/21.   - During surgery, a small mesenteric nodule was sent for permanent section - path is pending. - afebrile, VSS, WBC downtrending (13.7) -Allow regular diet, will await better intake prior to stopping TPN  FEN: Regular ID: none VTE: SCD's, hep gtt for hx DVT/hypercoagulable state Foley: removed 7/3, external cath in place Dispo: med- surg, [place] service.   Hiatal hernia Cardiomyopathy, new acute systolic heart dysfunction - cards following  PMH DVT - acute vs chronic DVT visualized on RLE doppler 12/26/21    LOS: 16 days    Rosario Adie, MD

## 2022-01-05 NOTE — Progress Notes (Signed)
Tele called this RN to report that pt had a period of 13-15 seconds of a drop in heartrate.  Tele was concerned about a possible vagal episode.  Pt denies coughing, bearing down or moving recently.  Pt laying comfortably in the bed at this time.  Pt's HR is back to normal.  This RN reviewed tele strip and does appear that pt was possibly trying to go into a heart block, but came back out of it.  Will continue to monitor and tele aware to contact if this happens again.  Provider notified and aware.   01/05/22 2344  Provider Notification  Provider Name/Title Lorra Hals  Date Provider Notified 01/05/22  Time Provider Notified 2344  Method of Notification Page  Notification Reason Other (Comment) (change in tele)  Provider response No new orders;Other (Comment) (hold midnight IV metoprolol)  Date of Provider Response 01/05/22  Time of Provider Response [phone]

## 2022-01-05 NOTE — TOC Progression Note (Signed)
Transition of Care [facility]) - Progression Note    Patient Details  Name: Katrina Donaldson MRN: [id number] Date of Birth: 02-17-1956  Transition of [facility]) CM/SW Contact  Leeroy Cha, RN Phone Number: 01/05/2022, 8:52 AM  Clinical Narrative:    [phone] chart reviewed.  Following for toc needs.  Plan is to return home with self-care at this time.   Expected Discharge Plan: [place] Barriers to Discharge: Continued Medical Work up  Expected Discharge Plan and Services Expected Discharge Plan: [place] In-house Referral: Clinical Social Work   Post Acute Care Choice: [place] Living arrangements for the past 2 months: [place]                 DME Arranged: N/A DME Agency: NA                   Social Determinants of Health (SDOH) Interventions    Readmission Risk Interventions     No data to display

## 2022-01-06 DIAGNOSIS — I429 Cardiomyopathy, unspecified: Secondary | ICD-10-CM

## 2022-01-06 DIAGNOSIS — D509 Iron deficiency anemia, unspecified: Secondary | ICD-10-CM | POA: Diagnosis not present

## 2022-01-06 DIAGNOSIS — R410 Disorientation, unspecified: Secondary | ICD-10-CM

## 2022-01-06 DIAGNOSIS — N179 Acute kidney failure, unspecified: Secondary | ICD-10-CM | POA: Diagnosis not present

## 2022-01-06 DIAGNOSIS — K56609 Unspecified intestinal obstruction, unspecified as to partial versus complete obstruction: Secondary | ICD-10-CM | POA: Diagnosis not present

## 2022-01-06 DIAGNOSIS — Z7401 Bed confinement status: Secondary | ICD-10-CM | POA: Diagnosis not present

## 2022-01-06 LAB — CBC WITH DIFFERENTIAL/PLATELET
Abs Immature Granulocytes: 0.18 10*3/uL — ABNORMAL HIGH (ref 0.00–0.07)
Basophils Absolute: 0 10*3/uL (ref 0.0–0.1)
Basophils Relative: 0 %
Eosinophils Absolute: 0.3 10*3/uL (ref 0.0–0.5)
Eosinophils Relative: 3 %
HCT: 22.8 % — ABNORMAL LOW (ref 36.0–46.0)
Hemoglobin: 7.9 g/dL — ABNORMAL LOW (ref 12.0–15.0)
Immature Granulocytes: 2 %
Lymphocytes Relative: 10 %
Lymphs Abs: 1 10*3/uL (ref 0.7–4.0)
MCH: 25.9 pg — ABNORMAL LOW (ref 26.0–34.0)
MCHC: 34.6 g/dL (ref 30.0–36.0)
MCV: 74.8 fL — ABNORMAL LOW (ref 80.0–100.0)
Monocytes Absolute: 0.6 10*3/uL (ref 0.1–1.0)
Monocytes Relative: 6 %
Neutro Abs: 8.2 10*3/uL — ABNORMAL HIGH (ref 1.7–7.7)
Neutrophils Relative %: 79 %
Platelets: 316 10*3/uL (ref 150–400)
RBC: 3.05 MIL/uL — ABNORMAL LOW (ref 3.87–5.11)
RDW: 18.3 % — ABNORMAL HIGH (ref 11.5–15.5)
WBC: 10.4 10*3/uL (ref 4.0–10.5)
nRBC: 0.2 % (ref 0.0–0.2)

## 2022-01-06 LAB — BASIC METABOLIC PANEL
Anion gap: 5 (ref 5–15)
BUN: 13 mg/dL (ref 8–23)
CO2: 25 mmol/L (ref 22–32)
Calcium: 8.1 mg/dL — ABNORMAL LOW (ref 8.9–10.3)
Chloride: 109 mmol/L (ref 98–111)
Creatinine, Ser: 0.44 mg/dL (ref 0.44–1.00)
GFR, Estimated: >60 mL/min (ref >60)
Glucose, Bld: 121 mg/dL — ABNORMAL HIGH (ref 70–99)
Potassium: 4.4 mmol/L (ref 3.5–5.1)
Sodium: 139 mmol/L (ref 135–145)

## 2022-01-06 LAB — PHOSPHORUS: Phosphorus: 3.6 mg/dL (ref 2.5–4.6)

## 2022-01-06 LAB — GLUCOSE, CAPILLARY
Glucose-Capillary: 113 mg/dL — ABNORMAL HIGH (ref 70–99)
Glucose-Capillary: 119 mg/dL — ABNORMAL HIGH (ref 70–99)
Glucose-Capillary: 129 mg/dL — ABNORMAL HIGH (ref 70–99)
Glucose-Capillary: 132 mg/dL — ABNORMAL HIGH (ref 70–99)

## 2022-01-06 LAB — MAGNESIUM: Magnesium: 2 mg/dL (ref 1.7–2.4)

## 2022-01-06 MED ORDER — TRAVASOL 10 % IV SOLN
INTRAVENOUS | Status: AC
  Filled 2022-01-06: qty 451.2

## 2022-01-06 NOTE — Progress Notes (Signed)
PHARMACY - TOTAL PARENTERAL NUTRITION CONSULT NOTE   Indication: Small bowel obstruction  Patient Measurements: Height: '5\' 6"'$  (167.6 cm) Weight: 84.1 kg (185 lb 6.5 oz) IBW/kg (Calculated) : 59.3 TPN AdjBW (KG): 65.5 Body mass index is 29.93 kg/m.  Assessment:  66 yo F presents with abdominal pain, n/v, and poor appetite x 5 days. 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 No BM for 5 days as well. NG output was 1.6 L and xray shows SBO. Does report shellfish allergy but not any fish. Reacted to shrimp but not crab and was not an anaphylactic reaction. Pharmacy consulted for TPN.  Glucose / Insulin: No hx of DM. CBGs <180. 1 unit of SSI / 24 hrs. - 6/30 Dexamethasone '4mg'$   Electrolytes: WNL Renal: SCr WNL.   Hepatic:  LFTs slightly elevated. Tbili WNL. TG wnl. Albumin low at 2.2 (7/7). Intake / Output; MIVF: UOP ok - PO Intake 240 mL - D5 1/2NS at [place] / Procedures:  6/30 Exploratory laparotomy and lysis of adhesions.   Central access: PICC placed 6/29 TPN start date: 6/29  Nutritional Goals: Goal TPN rate is 80 mL/hr (provides 90 g of protein and 2,085 kcals per day)  RD Assessment: Estimated Needs Total Energy Estimated Needs: 1950-2150 Total Protein Estimated Needs: 85-95g Total Fluid Estimated Needs: 2L/day  Current Nutrition:  TPN  Changed to regular diet but poor appetite Ensure [place] ordered TID (but patient refusing) -starting to eat a bit more per primary  Plan:  At 18:00:  Continue TPN at half rate of 23m/hr; Discussed with primary. Electrolytes in TPN: Na 1011m/L, K 4022mL, Ca 5mE68m, Mg 7mEq25m and Phos 10mmo62m Cl:Ac 1:1 Add standard MVI and trace elements to TPN Sensitive SSI to Q8h and adjust as needed  [facility] labs on Mon/Thurs F/U if regular diet tolerated to consider weaning off TPN  Thank you for allowing pharmacy to be a part of this patient's care.  MelissRoyetta AsalmD,  BCPS Clinical Pharmacist [place] Please utilize Amion for appropriate phone number to reach the unit pharmacist ([facility] [email or web address] 10:51 AM

## 2022-01-06 NOTE — Progress Notes (Signed)
Ekg done as ordered by Dr Harvie Junior due to abnormal cardiac rhythms during night on previous shift. ST and heart regular at present. Dr. Harvie Junior said he would let Cardiology know.

## 2022-01-06 NOTE — Progress Notes (Addendum)
PROGRESS NOTE    Katrina Donaldson  DXI:[id number] DOB: 01-25-1956 DOA: 12/20/2021 PCP: Donald Prose, MD   Brief Narrative:  66 y.o.f w/ history significant of laparoscopic partial colectomy in 01/2021 2/2 cecal mass, thyroidectomy, brain aneurysm, chronic anticoagulation, hypertension, hypothyroidism, CVA, sacral osteomyelitis, wheelchair-bound presented from SNF complaining of abdominal pain, nausea, vomiting, poor appetite  x 5 days, last BM 5 days PTA. In the ED, vital signs noted for tachycardia otherwise fairly stable, labs noted for mild anemia, otherwise stable.  CT abdomen/pelvis showed high-grade SBO likely due to adhesions, large hiatal hernia.  Surgery consulted and patient admitted for further management  Patient has failed to progress despite having NG tube decompression IV fluid hydration. She was noted to have tachycardia abnormal EKG, no chest pain troponins are negative, EKG shows sinus tachycardia. Echocardiogram showed decreased EF 30-35%. Labs with improved/stable renal function, stable hemoglobin at 10.2 g.  Seen by cardiology.  Sbo did not improve subsequently underwent ex laparotomy with lysis of adhesion by Dr Barry Dienes 6/30.patient continued on NG decompression and clamping trial-ng fell off 7/4-keeping it off.  Awaiting return of bowel function  Pt did have an episode of bradycardia last night, ekg this am shows sinus tach 107, mg/k wnl   Assessment & Plan:   Principal Problem:   SBO (small bowel obstruction) (HCC) Active Problems:   Essential hypertension   Anemia, iron deficiency   Hypothyroidism   Hypercoagulable state ([place])   Hypokalemia   Confusion   Chronic anticoagulation   Cecum mass   Sacral osteomyelitis (HCC)   Pressure injury of sacral region, stage 4 (HCC)   AKI (acute kidney injury) ([place])   Pressure ulcer of sacral region, stage 3 (HCC)   Cardiomyopathy ([place])   Bedridden   Incarcerated hiatal hernia   SBO secondary to adhesions: s/p ex  laparotomy with lysis of adhesion by Dr Barry Dienes 6/30, after it did not improve on conservative management.continue TPN-although at reduced rate as diet advances, but will continue for another day as pt has had minimal po intake.  Gen surg did liberlaize diet to encourage po intake Continue remaining plan of care as per surgical service Continue pain control DVT prophylaxis and ambulation PT OT.  Follow-up pathology on Biopsy from mesenteric nodule.     Large hiatal hernia: on PPI twice daily Leukocytosis postop: WBC count now downtrending remains afebrile, no UTI.  Foley off.  Monitor Hypernatremia w/ Hyperchloremia:Resolved  Hypokalemia resolved  Hyperkalemia-resolved 4.4   Cardiomyopathy w/ new acute systolic dysfunction with EF 30 to 35% Essential hypertension Sinus tachycardia: Likely tachycardia mediated acute systolic dysfunction in the setting of acute illness. Cardiology following, on metoprolol 5 mg scheduled/prn, monitor in tele.She will need repeat echo once acute illness resolves and outpatient CHF follow-up.  CHF is compensated.  Continue to monitor intake output, watch for fluid overload- noted some wt changes-if symptomatic add lasix, f/up Cardio recs today-no charge noted but note pending, if anything needs to be adjusted for tachycardia .   Normocytic anemia Iron deficiency Anemia of chronic disease:  Hgb  7.9  Transfuse if less than 7 g.  GERD:cont IV PPI. JQB:HALPFX on hold. Fever-several days ago, no recurrence/resolved Acute urine retention-resolved. Off foley 7/3 Hypothyroidism: cont IV Synthroid TKW:IOXBDZHG. Hypophosphatemia resolved.monitor.   History of DVT starting in 2005//Hypercoagulable state Chronic DVT ON [place] 6/28: Xarelto restarted as pt refusing heparin labs and no immediate procedure planned   Goals of care full code, prognosis remains to be seen given patient's acute multiple  medical problems including bowel obstruction and acute systolic dysfunction.    Stage III pressure ulcer on sacrum POA: Continue wound care and offloading. Ext foley        Pressure Injury 12/20/21 Sacrum Posterior Stage 3 -  Full thickness tissue loss. Subcutaneous fat may be visible but bone, tendon or muscle are NOT exposed. Red (Active)  12/20/21 1950  Location:Sacrum  Location Orientation:Posterior  Staging:Stage 3-Full thickness tissue loss. Subcutaneous fat may be visible but bone, tendon or muscle are NOT exposed.  Wound Description (Comments):Red  Present on Admission:Yes  Dressing Type Foam:Lift dressing to assess site every shift.     DVT prophylaxis: TK:WIOXBDZ   Code Status: full    Code Status Orders  (From admission, onward)           Start     Ordered   12/20/21 1811  Full code  Continuous        12/20/21 1810           Code Status History     Date Active Date Inactive Code Status Order ID Comments User Context   01/26/2021 2309 02/14/2021 0818 Full Code [id number]  Kayleen Memos, DO ED   06/03/2017 2108 06/05/2017 2129 Full Code [id number]  Consuella Lose, MD Inpatient   07/24/2016 1847 08/24/2016 1901 Full Code [id number]  Cathlyn Parsons, PA-C Inpatient   07/24/2016 1847 07/24/2016 1847 Full Code [id number]  Cathlyn Parsons, PA-C Inpatient   07/19/2016 2034 07/24/2016 1835 Full Code [id number]  Consuella Lose, MD Inpatient   07/21/2015 1026 07/22/2015 0326 Full Code [id number]  Consuella Lose, MD HOV      Advance Directive Documentation    Flowsheet Row Most Recent Value  Type of Advance Directive Healthcare Power of [place]  Pre-existing out of facility DNR order (yellow form or pink MOST form) --  "MOST" Form in Place? --      Family Communication: discussed with pt in room today  Disposition Plan: Patient still requiring TPN not yet stable for discharge  Consults called: None Admission status: Inpatient   Consultants:  Gen surg  Procedures:  DG CHEST PORT 1 VIEW  Result Date: 12/31/2021 CLINICAL DATA:   Leukocytosis EXAM: PORTABLE CHEST 1 VIEW COMPARISON:  12/21/2021 FINDINGS: Patient rotated to the right. Nasogastric tube terminates in the herniated stomach with side port likely just below the gastroesophageal junction. No pleural effusion or pneumothorax. Large hiatal hernia with abdominal organs positioned in the lower left hemithorax. No congestive failure. Volume loss in the left lung base. Clear right lung. Right PICC line terminates at the superior caval/atrial junction. IMPRESSION: Large hiatal hernia. Volume loss within the adjacent left lung base, similar. Nasogastric tube currently positioned within the herniated stomach. Cardiomegaly without congestive failure. Electronically Signed   By: Abigail Miyamoto M.D.   On: 12/31/2021 09:21   VAS [place] LOWER EXTREMITY VENOUS (DVT)  Result Date: 12/26/2021  Lower Venous DVT Study Patient Name:  Katrina Donaldson  Date of Exam:   12/26/2021 Medical Rec #: [id number]        Accession #:    [id number] Date of Birth: [birth year]        Patient Gender: F Patient Age:   50 years Exam Location:  [facility] Procedure:      VAS [place] LOWER EXTREMITY VENOUS (DVT) Referring Phys: [place] --------------------------------------------------------------------------------  Indications: Swelling.  Risk Factors: DVT. Limitations: Poor ultrasound/tissue interface and patient positioning, patient immobility, patient pain tolerance. Comparison Study: 07/25/2016 - - Findings consistent  with acute deep vein                   thrombosis involving the                   right common femoral vein, right profunda femoris vein, right                   femoral vein, right popliteal vein, right posterial tibial                   vein,                   and right peroneal vein.                   - Findings consistent with acute superficial vein thrombosis                   involving the right small saphenous vein.                   - No evidence of deep vein thrombosis involving the left lower                    extremity.                   - No evidence of Baker&'s cyst on the right or left. Performing Technologist: Oliver Hum RVT  Examination Guidelines: A complete evaluation includes B-mode imaging, spectral Doppler, color Doppler, and power Doppler as needed of all accessible portions of each vessel. Bilateral testing is considered an integral part of a complete examination. Limited examinations for reoccurring indications may be performed as noted. The reflux portion of the exam is performed with the patient in reverse Trendelenburg.  +---------+---------------+---------+-----------+----------+--------------+ RIGHT    CompressibilityPhasicitySpontaneityPropertiesThrombus Aging +---------+---------------+---------+-----------+----------+--------------+ CFV      Partial        Yes      Yes                  Chronic        +---------+---------------+---------+-----------+----------+--------------+ SFJ      Full                                                        +---------+---------------+---------+-----------+----------+--------------+ FV Prox  Full                                                        +---------+---------------+---------+-----------+----------+--------------+ FV Mid                  Yes      Yes                                 +---------+---------------+---------+-----------+----------+--------------+ FV DistalFull                                                        +---------+---------------+---------+-----------+----------+--------------+  PFV      Full                                                        +---------+---------------+---------+-----------+----------+--------------+ POP      Full           Yes      Yes                                 +---------+---------------+---------+-----------+----------+--------------+ PTV      Full                                                         +---------+---------------+---------+-----------+----------+--------------+ PERO     Full                                                        +---------+---------------+---------+-----------+----------+--------------+   +---------+---------------+---------+-----------+----------+-------------------+ LEFT     CompressibilityPhasicitySpontaneityPropertiesThrombus Aging      +---------+---------------+---------+-----------+----------+-------------------+ CFV      Full           Yes      Yes                                      +---------+---------------+---------+-----------+----------+-------------------+ SFJ      Full                                                             +---------+---------------+---------+-----------+----------+-------------------+ FV Prox  Full                                                             +---------+---------------+---------+-----------+----------+-------------------+ FV Mid   Full                                                             +---------+---------------+---------+-----------+----------+-------------------+ FV Distal               Yes      Yes                                      +---------+---------------+---------+-----------+----------+-------------------+ PFV      Full                                                             +---------+---------------+---------+-----------+----------+-------------------+  POP      Full           Yes      Yes                                      +---------+---------------+---------+-----------+----------+-------------------+ PTV      Full                                                             +---------+---------------+---------+-----------+----------+-------------------+ PERO                                                  Not well visualized +---------+---------------+---------+-----------+----------+-------------------+     Summary:  RIGHT: - 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 - No cystic structure found in the popliteal fossa.  LEFT: - There is no evidence of deep vein thrombosis in the lower extremity. However, portions of this examination were limited- see technologist comments above.  - No cystic structure found in the popliteal fossa.  *See table(s) above for measurements and observations. Electronically signed by Orlie Pollen on 12/26/2021 at 5:31:11 PM.    Final    ECHOCARDIOGRAM COMPLETE  Result Date: 12/26/2021    ECHOCARDIOGRAM REPORT   Patient Name:   Katrina Donaldson Date of Exam: 12/26/2021 Medical Rec #:  [id number]       Height:       66.0 in Accession #:    [id number]      Weight:       177.2 lb Date of Birth:  01-12-1956       BSA:          1.900 m Patient Age:    89 years        BP:           125/82 mmHg Patient Gender: F               HR:           120 bpm. Exam Location:  Inpatient Procedure: 2D Echo, Color Doppler and Cardiac Doppler Indications:    Abnormal ekg  History:        Patient has no prior history of Echocardiogram examinations.  Sonographer:    Joette Catching RCS Referring Phys: [id number] Gladwin Grano  Sonographer Comments: Technically challenging study due to limited acoustic windows. Supine scan. IMPRESSIONS  1. Left ventricular ejection fraction, by estimation, is 30 to 35%. The left ventricle has moderately decreased function. The left ventricle demonstrates global hypokinesis. Left ventricular diastolic parameters are consistent with Grade I diastolic dysfunction (impaired relaxation).  2. Right ventricular systolic function is normal. The right ventricular size is normal.  3. The mitral valve is normal in structure. No evidence of mitral valve regurgitation. No evidence of mitral stenosis.  4. The aortic valve is calcified. There is mild calcification of the aortic valve. There is mild thickening of the aortic valve. Aortic valve regurgitation is not  visualized. Aortic valve sclerosis is present, with no evidence of aortic valve stenosis.  5. The inferior vena cava  is normal in size with greater than 50% respiratory variability, suggesting right atrial pressure of 3 mmHg. FINDINGS  Left Ventricle: Left ventricular ejection fraction, by estimation, is 30 to 35%. The left ventricle has moderately decreased function. The left ventricle demonstrates global hypokinesis. The left ventricular internal cavity size was normal in size. There is no left ventricular hypertrophy. Abnormal (paradoxical) septal motion, consistent with left bundle branch block. Left ventricular diastolic parameters are consistent with Grade I diastolic dysfunction (impaired relaxation). Right Ventricle: The right ventricular size is normal. No increase in right ventricular wall thickness. Right ventricular systolic function is normal. Left Atrium: Left atrial size was normal in size. Right Atrium: Right atrial size was normal in size. Pericardium: There is no evidence of pericardial effusion. Mitral Valve: The mitral valve is normal in structure. No evidence of mitral valve regurgitation. No evidence of mitral valve stenosis. Tricuspid Valve: The tricuspid valve is normal in structure. Tricuspid valve regurgitation is not demonstrated. No evidence of tricuspid stenosis. Aortic Valve: The aortic valve is calcified. There is mild calcification of the aortic valve. There is mild thickening of the aortic valve. Aortic valve regurgitation is not visualized. Aortic valve sclerosis is present, with no evidence of aortic valve stenosis. Aortic valve mean gradient measures 4.0 mmHg. Aortic valve peak gradient measures 6.2 mmHg. Aortic valve area, by VTI measures 2.26 cm. Pulmonic Valve: The pulmonic valve was normal in structure. Pulmonic valve regurgitation is not visualized. No evidence of pulmonic stenosis. Aorta: The aortic root is normal in size and structure. Venous: The inferior vena cava is  normal in size with greater than 50% respiratory variability, suggesting right atrial pressure of 3 mmHg. IAS/Shunts: No atrial level shunt detected by color flow Doppler.  LEFT VENTRICLE PLAX 2D LVIDd:         3.80 cm   Diastology LVIDs:         3.30 cm   LV e' medial:    5.22 cm/s LV PW:         1.00 cm   LV E/e' medial:  10.0 LV IVS:        0.90 cm   LV e' lateral:   5.77 cm/s LVOT diam:     2.30 cm   LV E/e' lateral: 9.1 LV SV:         30 LV SV Index:   16 LVOT Area:     4.15 cm  RIGHT VENTRICLE             IVC RV S prime:     29.70 cm/s  IVC diam: 1.70 cm TAPSE (M-mode): 2.1 cm LEFT ATRIUM             Index LA diam:        2.10 cm 1.11 cm/m LA Vol (A2C):   20.0 ml 10.53 ml/m LA Vol (A4C):   57.6 ml 30.32 ml/m LA Biplane Vol: 35.8 ml 18.84 ml/m  AORTIC VALVE                    PULMONIC VALVE AV Area (Vmax):    2.08 cm     PV Vmax:          1.15 m/s AV Area (Vmean):   2.10 cm     PV Peak grad:     5.3 mmHg AV Area (VTI):     2.26 cm     PR End Diast Vel: 10.76 msec AV Vmax:  125.00 cm/s AV Vmean:          94.000 cm/s AV VTI:            0.134 m AV Peak Grad:      6.2 mmHg AV Mean Grad:      4.0 mmHg LVOT Vmax:         62.70 cm/s LVOT Vmean:        47.600 cm/s LVOT VTI:          0.073 m LVOT/AV VTI ratio: 0.54  AORTA Ao Root diam: 3.40 cm Ao Asc diam:  3.30 cm MITRAL VALVE               TRICUSPID VALVE MV Area (PHT): 5.84 cm    TR Peak grad:   25.0 mmHg MV Decel Time: 130 msec    TR Vmax:        250.00 cm/s MV E velocity: 52.30 cm/s MV A velocity: 78.40 cm/s  SHUNTS MV E/A ratio:  0.67        Systemic VTI:  0.07 m                            Systemic Diam: 2.30 cm Candee Furbish MD Electronically signed by Candee Furbish MD Signature Date/Time: 12/26/2021/5:05:24 PM    Final    [place] EKG SITE RITE  Result Date: 12/26/2021 If Site Rite image not attached, placement could not be confirmed due to current cardiac rhythm.  DG Abd Portable 1V  Result Date: 12/26/2021 CLINICAL DATA:  Small-bowel obstruction  EXAM: PORTABLE ABDOMEN - 1 VIEW COMPARISON:  12/25/2021 FINDINGS: Persistent dilated loops of small bowel particularly in the left abdomen. Maximum measurable distension is decreased. Calcified uterine fibroid is again noted. IMPRESSION: Persistent small bowel obstruction. Maximum distension measures decreased. Electronically Signed   By: Macy Mis M.D.   On: 12/26/2021 08:24   DG Abd Portable 1V  Result Date: 12/25/2021 CLINICAL DATA:  Small bowel obstruction. EXAM: PORTABLE ABDOMEN - 1 VIEW COMPARISON:  Abdominal x-ray from yesterday. FINDINGS: Enteric tube incompletely visualized looped in the left chest, presumably within the large hiatal hernia as seen on CT. Multiple dilated loops of small bowel are unchanged. Calcified uterine fibroid again noted. No acute osseous abnormality. IMPRESSION: 1. Unchanged small bowel obstruction. Electronically Signed   By: Titus Dubin M.D.   On: 12/25/2021 10:31   DG Abd 1 View  Result Date: 12/24/2021 CLINICAL DATA:  None hour delayed film.  Small bowel obstruction. EXAM: ABDOMEN - 1 VIEW COMPARISON:  12/23/2021 FINDINGS: Continued small bowel dilatation compatible with small bowel obstruction. Likely not significantly changed. No visible free air. Large calcified fibroid in the right side of the pelvis. NG tube is not visualized. This likely projects off the superior aspect of the image as the upper abdomen is not included on this single image. IMPRESSION: Continued small bowel obstruction pattern, not significantly changed. Electronically Signed   By: Rolm Baptise M.D.   On: 12/24/2021 23:14   DG Abd Portable 1V  Result Date: 12/23/2021 CLINICAL DATA:  Nasogastric tube placement. Small-bowel obstruction. EXAM: PORTABLE ABDOMEN - 1 VIEW COMPARISON:  Prior today FINDINGS: A nasogastric tube is now seen which appears coiled within a hiatal hernia. Moderately dilated small bowel loops show mild decrease in dilatation since prior exam. IMPRESSION: Nasogastric  tube appears coiled within a hiatal hernia. Mildly decreased dilatation of small bowel loops since prior exam. Electronically Signed   By:  Marlaine Hind M.D.   On: 12/23/2021 16:27   DG Abd Portable 1V-Small Bowel Obstruction Protocol-24 hr delay  Result Date: 12/23/2021 CLINICAL DATA:  Small bowel obstruction protocol, 24 hour delayed image. EXAM: PORTABLE ABDOMEN - 1 VIEW COMPARISON:  Abdominal x-rays 12/22/2021. FINDINGS: Contrast material remains in small bowel loops in the left lower quadrant. No colonic contrast identified. Small bowel loops are air-filled and dilated measuring up to 6.7 cm, similar to the prior study. Calcified uterine fibroid again noted. IMPRESSION: 1. Contrast material remains in small bowel loops in the left lower quadrant. Dilated small bowel is unchanged. Findings are compatible with small-bowel obstruction. Electronically Signed   By: Ronney Asters M.D.   On: 12/23/2021 15:27   DG Abd Portable 1V-Small Bowel Obstruction Protocol-initial, 8 hr delay  Result Date: 12/22/2021 CLINICAL DATA:  8 hour small-bowel follow up EXAM: PORTABLE ABDOMEN - 1 VIEW COMPARISON:  Film from earlier in the same day. FINDINGS: Multiple loops of dilated small bowel are again identified. Previously administered contrast lies predominately within the mid to distal small bowel. No colonic contrast is noted. 24 hour follow-up is recommended. Calcified uterine fibroid is seen. IMPRESSION: Administered contrast lies within the dilated small bowel. No colonic contrast is seen. 24 hour follow-up is recommended. Electronically Signed   By: Inez Catalina M.D.   On: 12/22/2021 22:11   DG Abd Portable 1V  Result Date: 12/22/2021 CLINICAL DATA:  Small-bowel obstruction EXAM: PORTABLE ABDOMEN - 1 VIEW COMPARISON:  12/20/2021 FINDINGS: NG tube in the stomach with the tip near the antrum. Dilated small bowel loops similar to the prior study compatible with small bowel obstruction. Colon decompressed. Calcified  uterine fibroid in the right pelvis. IMPRESSION: NG tube in the gastric antrum.  Small bowel obstruction unchanged. Electronically Signed   By: Franchot Gallo M.D.   On: 12/22/2021 11:29   DG Chest Port 1 View  Result Date: 12/21/2021 CLINICAL DATA:  [id number] fever EXAM: PORTABLE CHEST 1 VIEW COMPARISON:  Chest x-ray from yesterday nausea correlation is made with a CT of the abdomen and pelvis from yesterday FINDINGS: There has been interval insertion of an NG tube with its distal end at the right upper-mid abdomen. There has been interval partial decompression of the dilated stomach at the left lower thorax. Large hiatal hernia containing the stomach extending up to the left mid thorax. Left basilar atelectasis. Cardiomegaly. Right lung remains clear. Moderately severe dilatation of the small bowel loops of small bowel obstruction. IMPRESSION: There has been interval insertion of an NG tube with its distal end of the right upper-mid abdomen. There has been interval partial decompression of the herniated stomach through the hiatal hernia extending up to the mid left hemithorax. Left basilar atelectasis. Findings of high-grade small-bowel obstruction. Electronically Signed   By: Frazier Richards M.D.   On: 12/21/2021 08:57   DG Abd Portable 1V-Small Bowel Protocol-Position Verification  Result Date: 12/20/2021 CLINICAL DATA:  NG tube placement EXAM: PORTABLE ABDOMEN - 1 VIEW COMPARISON:  CT 12/20/2021 FINDINGS: Elevated left diaphragm with large hiatal hernia and intrathoracic stomach. Esophageal tube tip is below the diaphragm and probably in the region of gastroduodenal junction. Multiple loops of dilated small bowel consistent with an obstruction IMPRESSION: 1. Esophageal tube tip likely over the gastroduodenal junction 2. Elevated left diaphragm with large hiatal hernia and intrathoracic stomach. Subsegmental atelectasis at the left base. 3. Multiple dilated loops of small bowel consistent with an obstruction  Electronically Signed   By: Maudie Mercury  Francoise Ceo M.D.   On: 12/20/2021 23:24   CT ABDOMEN PELVIS W CONTRAST  Result Date: 12/20/2021 CLINICAL DATA:  Abdominal pain, acute, nonlocalized n/v, no BM x5 days, diffuse abd pain lower EXAM: CT ABDOMEN AND PELVIS WITH CONTRAST TECHNIQUE: Multidetector CT imaging of the abdomen and pelvis was performed using the standard protocol following bolus administration of intravenous contrast. RADIATION DOSE REDUCTION: This exam was performed according to the departmental dose-optimization program which includes automated exposure control, adjustment of the mA and/or kV according to patient size and/or use of iterative reconstruction technique. CONTRAST:  186m OMNIPAQUE IOHEXOL 300 MG/ML  SOLN COMPARISON:  CT 02/01/2021 FINDINGS: Lower chest: Bibasilar atelectasis. Unchanged elevated left hemidiaphragm. Dilated and fluid-filled visualized esophagus. Hepatobiliary: No focal liver abnormality is seen. Mild gallbladder distension. Pancreas: Unremarkable. No pancreatic ductal dilatation or surrounding inflammatory changes. Spleen: Normal in size without focal abnormality. Adrenals/Urinary Tract: Adrenal glands are unremarkable. No hydronephrosis. There are nonobstructing left lower pole renal stones largest measuring up to 4 mm. The bladder is moderately distended. Stomach/Bowel: Large hiatal hernia with dilated partially intrathoracic stomach. Multiple dilated loops of small bowel with transition point in the right upper abdomen (series 2, image 38, series 6, image 29-30). There is mesenteric edema.Prior right hemicolectomy with ileocolic anastomosis. Vascular/Lymphatic: Scattered atherosclerosis. No AAA. No lymphadenopathy. Reproductive: There is a subserosal or pedunculated calcified uterine fibroid which now appears on the right, likely due to rightward mass effect on the uterus by ascites and previously described small bowel obstruction. Other: Small volume abdominopelvic ascites.  No  free air. Musculoskeletal: No acute osseous abnormality. Unchanged anterolisthesis at L4-L5. Moderate bilateral hip osteoarthritis. 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 Chronic changes at the origin of the left rectus femoris. There is diffuse muscle atrophy. IMPRESSION: High-grade small-bowel obstruction with transition point in the right upper abdomen, likely due to adhesions. Large hiatal hernia with dilated partially intrathoracic stomach related to the SBO. Small volume abdominopelvic ascites. Additional chronic findings as described above. Electronically Signed   By: JMaurine SimmeringM.D.   On: 12/20/2021 15:27   DG Chest Portable 1 View  Result Date: 12/20/2021 CLINICAL DATA:  Abdominal pain and vomiting. Small-bowel obstruction. EXAM: PORTABLE CHEST 1 VIEW COMPARISON:  CT of the abdomen and pelvis 02/01/2021 FINDINGS: The heart is enlarged. Chronic elevation of left hemidiaphragm present. The stomach is within the hernia, dilated. Additional loops of bowel are present within the hernia. Moderate distension of small bowel noted in the abdomen. IMPRESSION: 1. Moderate distension of small bowel compatible with small bowel obstruction or ileus. Dedicated imaging of the abdomen scratched at dedicated radiographs of the abdomen or CT of the abdomen pelvis would be useful for further evaluation. 2. Chronic elevation of left hemidiaphragm/hernia. 3. Cardiomegaly. Electronically Signed   By: CSan MorelleM.D.   On: 12/20/2021 13:03       Subjective: Pt reports she tried some of the meal last night, appetite is slowly returning  Objective: Vitals:   01/05/22 1414 01/05/22 2034 01/06/22 0517 01/06/22 1141  BP: 116/69 113/70 112/70 119/78  Pulse: 100 96 (!) 110 (!) 106  Resp: '14 16 17 18  '$ Temp: 98.7 F (37.1 C) 98.4 F (36.9 C) 98.3 F (36.8 C) 98.1 F (36.7 C)  TempSrc: Oral Oral Oral Oral  SpO2: 98% 98% 99% 99%  Weight:      Height:        Intake/Output  Summary (Last 24 hours) at 01/06/2022 1204 Last data filed at 01/06/2022 1000  Gross per 24 hour  Intake 669.46 ml  Output 1200 ml  Net -530.54 ml   Filed Weights   12/23/21 0600 12/26/21 1230 12/28/21 0949  Weight: 80.4 kg 84.1 kg 84.1 kg    Examination:  General exam: Appears calm and comfortable , chrnically ill-appearing Respiratory system: Clear to auscultation. Respiratory effort normal. Cardiovascular system: S1 & S2 heard, RRR. No JVD, murmurs, rubs, gallops or clicks. No pedal edema. Gastrointestinal system: Abdomen is nondistended, soft and nontender. Mild bowel sounds Central nervous system: Alert and oriented. No focal neurological deficits. Extremities: wwp, nv intact Skin: No rashes, lesions or ulcers Psychiatry: Judgement and insight appear normal. Mood & affect flat     Data Reviewed: I have personally reviewed following labs and imaging studies  CBC: Recent Labs  Lab 01/02/22 0458 01/03/22 0452 01/04/22 0541 01/05/22 0622 01/06/22 0306  WBC 14.1* 13.7* 11.8* 11.6* 10.4  NEUTROABS 11.3* 11.4* 9.7* 9.4* 8.2*  HGB 9.8* 8.3* 8.4* 7.8* 7.9*  HCT 28.8* 23.9* 24.6* 22.1* 22.8*  MCV 76.6* 75.2* 75.5* 75.4* 74.8*  PLT 262 300 311 318 157   Basic Metabolic Panel: Recent Labs  Lab 12/31/21 0328 01/02/22 0458 01/02/22 1219 01/03/22 0452 01/04/22 0541 01/06/22 0306  NA 136 132*  --  132* 135 139  K 4.5 5.7* 4.8 4.4 4.0 4.4  CL 104 98  --  98 102 109  CO2 25 24  --  '27 26 25  '$ GLUCOSE 114* 104*  --  141* 119* 121*  BUN 13 14  --  '14 15 13  '$ CREATININE 0.45 0.50  --  0.45 0.43* 0.44  CALCIUM 8.0* 8.4*  --  8.2* 8.2* 8.1*  MG 1.9 2.1  --  1.9 1.9 2.0  PHOS 4.0 4.8*  --  4.5 3.9 3.6   GFR: Estimated Creatinine Clearance: 73.5 mL/min (by C-G formula based on SCr of 0.44 mg/dL). Liver Function Tests: Recent Labs  Lab 12/31/21 0328 01/03/22 0452 01/04/22 0541  AST 18 45* 56*  ALT 12 39 50*  ALKPHOS 86 156* 179*  BILITOT 0.4 0.6 0.7  PROT 6.5 7.3 7.4   ALBUMIN 2.1* 2.2* 2.2*   No results for input(s): "LIPASE", "AMYLASE" in the last 168 hours. No results for input(s): "AMMONIA" in the last 168 hours. Coagulation Profile: No results for input(s): "INR", "PROTIME" in the last 168 hours. Cardiac Enzymes: No results for input(s): "CKTOTAL", "CKMB", "CKMBINDEX", "TROPONINI" in the last 168 hours. BNP (last 3 results) No results for input(s): "PROBNP" in the last 8760 hours. HbA1C: No results for input(s): "HGBA1C" in the last 72 hours. CBG: Recent Labs  Lab 01/04/22 2352 01/05/22 0819 01/05/22 1551 01/06/22 0012 01/06/22 0749  GLUCAP 117* 123* 116* 113* 132*   Lipid Profile: No results for input(s): "CHOL", "HDL", "LDLCALC", "TRIG", "CHOLHDL", "LDLDIRECT" in the last 72 hours. Thyroid Function Tests: No results for input(s): "TSH", "T4TOTAL", "FREET4", "T3FREE", "THYROIDAB" in the last 72 hours. Anemia Panel: No results for input(s): "VITAMINB12", "FOLATE", "FERRITIN", "TIBC", "IRON", "RETICCTPCT" in the last 72 hours. Sepsis Labs: No results for input(s): "PROCALCITON", "LATICACIDVEN" in the last 168 hours.  No results found for this or any previous visit (from the past 240 hour(s)).       Radiology Studies: No results found.      Scheduled Meds:  acetaminophen  1,000 mg Oral Q6H   bisacodyl  10 mg Rectal Daily   Chlorhexidine Gluconate Cloth  6 each Topical Daily   feeding supplement  237 mL Oral TID  WC   fluticasone  1 spray Each Nare BID   insulin aspart  0-9 Units Subcutaneous Q8H   levothyroxine  125 mcg Oral Q0600   lip balm   Topical BID   methocarbamol  500 mg Oral Q6H   metoprolol tartrate  5 mg Intravenous Q6H   pantoprazole  40 mg Oral BID   rivaroxaban  20 mg Oral Q supper   sodium chloride flush  10-40 mL Intracatheter Q12H   Continuous Infusions:  dextrose 5 % and 0.45% NaCl 10 mL/hr at 01/03/22 0332   TPN ADULT (ION) 40 mL/hr at 01/05/22 1749   TPN ADULT (ION)       LOS: 17 days     Time spent: 35 min    Nicolette Bang, MD [facility]  If 7PM-7AM, please contact night-coverage  01/06/2022, 12:04 PM

## 2022-01-06 NOTE — Progress Notes (Addendum)
Progress Note: General Surgery Service   Chief Complaint/Subjective: Denies abd pain.  States she is not eating much.  Denies nausea or vomiting  Objective: Vital signs in last 24 hours: Temp:  [98.3 F (36.8 C)-98.7 F (37.1 C)] 98.3 F (36.8 C) (07/09 0517) Pulse Rate:  [96-110] 110 (07/09 0517) Resp:  [14-17] 17 (07/09 0517) BP: (112-116)/(69-70) 112/70 (07/09 0517) SpO2:  [98 %-99 %] 99 % (07/09 0517) Last BM Date : 01/01/22  Intake/Output from previous day: 07/08 0701 - 07/09 0700 In: 1552 [P.O.:240; I.V.:1312] Out: 600 [Urine:600] Intake/Output this shift: Total I/O In: -  Out: 1000 [Urine:1000]  GI: Abd Soft, incision c/d/I w/ dressing, nontender.  Lab Results: CBC  Recent Labs    01/05/22 0622 01/06/22 0306  WBC 11.6* 10.4  HGB 7.8* 7.9*  HCT 22.1* 22.8*  PLT 318 316    BMET Recent Labs    01/04/22 0541 01/06/22 0306  NA 135 139  K 4.0 4.4  CL 102 109  CO2 26 25  GLUCOSE 119* 121*  BUN 15 13  CREATININE 0.43* 0.44  CALCIUM 8.2* 8.1*    PT/INR No results for input(s): "LABPROT", "INR" in the last 72 hours. ABG No results for input(s): "PHART", "HCO3" in the last 72 hours.  Invalid input(s): "PCO2", "PO2"  Anti-infectives: Anti-infectives (From admission, onward)    Start     Dose/Rate Route Frequency Ordered Stop   12/28/21 1030  ceFAZolin (ANCEF) IVPB 2g/100 mL premix        2 g 200 mL/hr over 30 Minutes Intravenous  Once 12/28/21 1021 12/28/21 1156   12/28/21 1019  ceFAZolin (ANCEF) 2-4 GM/100ML-% IVPB       Note to Pharmacy: Christell Faith L: cabinet override      12/28/21 1019 12/28/21 1154       Medications: Scheduled Meds:  acetaminophen  1,000 mg Oral Q6H   bisacodyl  10 mg Rectal Daily   Chlorhexidine Gluconate Cloth  6 each Topical Daily   feeding supplement  237 mL Oral TID WC   fluticasone  1 spray Each Nare BID   insulin aspart  0-9 Units Subcutaneous Q8H   levothyroxine  125 mcg Oral Q0600   lip balm   Topical BID    methocarbamol  500 mg Oral Q6H   metoprolol tartrate  5 mg Intravenous Q6H   pantoprazole  40 mg Oral BID   rivaroxaban  20 mg Oral Q supper   sodium chloride flush  10-40 mL Intracatheter Q12H   Continuous Infusions:  dextrose 5 % and 0.45% NaCl 10 mL/hr at 01/03/22 0332   TPN ADULT (ION) 40 mL/hr at 01/05/22 1749   TPN ADULT (ION)     PRN Meds:.albuterol, alum & mag hydroxide-simeth, azelastine, diphenhydrAMINE, hydrALAZINE, hydrocortisone, lip balm, magic mouthwash, menthol-cetylpyridinium, metoprolol tartrate, morphine injection, ondansetron **OR** ondansetron (ZOFRAN) IV, oxyCODONE, phenol, polyvinyl alcohol, simethicone, sodium chloride, sodium chloride flush  Assessment/Plan: Ms. Nester is a 66 year old female who underwent exploratory laparotomy with lysis of adhesions that were causing an adhesive small bowel obstruction on 12/28/21.    - afebrile, VSS, WBC downtrending and normalizing -Cont regular diet, will await better intake prior to stopping TPN  FEN: Regular ID: none VTE: SCD's, Xeralto for hx DVT/hypercoagulable state Foley: removed 7/3, external cath in place Dispo: med- surg, TRH service.   Hiatal hernia Cardiomyopathy, new acute systolic heart dysfunction - cards following  PMH DVT - acute vs chronic DVT visualized on RLE doppler 12/26/21  LOS: 17 days    Rosario Adie, MD

## 2022-01-07 ENCOUNTER — Inpatient Hospital Stay (HOSPITAL_COMMUNITY): Payer: Medicare Other

## 2022-01-07 DIAGNOSIS — I5041 Acute combined systolic (congestive) and diastolic (congestive) heart failure: Secondary | ICD-10-CM | POA: Diagnosis not present

## 2022-01-07 DIAGNOSIS — Z7401 Bed confinement status: Secondary | ICD-10-CM | POA: Diagnosis not present

## 2022-01-07 DIAGNOSIS — D509 Iron deficiency anemia, unspecified: Secondary | ICD-10-CM | POA: Diagnosis not present

## 2022-01-07 DIAGNOSIS — K56609 Unspecified intestinal obstruction, unspecified as to partial versus complete obstruction: Secondary | ICD-10-CM | POA: Diagnosis not present

## 2022-01-07 DIAGNOSIS — N179 Acute kidney failure, unspecified: Secondary | ICD-10-CM | POA: Diagnosis not present

## 2022-01-07 LAB — CBC WITH DIFFERENTIAL/PLATELET
Abs Immature Granulocytes: 0.09 10*3/uL — ABNORMAL HIGH (ref 0.00–0.07)
Basophils Absolute: 0 10*3/uL (ref 0.0–0.1)
Basophils Relative: 0 %
Eosinophils Absolute: 0.5 10*3/uL (ref 0.0–0.5)
Eosinophils Relative: 4 %
HCT: 23 % — ABNORMAL LOW (ref 36.0–46.0)
Hemoglobin: 8 g/dL — ABNORMAL LOW (ref 12.0–15.0)
Immature Granulocytes: 1 %
Lymphocytes Relative: 11 %
Lymphs Abs: 1.2 10*3/uL (ref 0.7–4.0)
MCH: 26.2 pg (ref 26.0–34.0)
MCHC: 34.8 g/dL (ref 30.0–36.0)
MCV: 75.4 fL — ABNORMAL LOW (ref 80.0–100.0)
Monocytes Absolute: 0.6 10*3/uL (ref 0.1–1.0)
Monocytes Relative: 6 %
Neutro Abs: 8.3 10*3/uL — ABNORMAL HIGH (ref 1.7–7.7)
Neutrophils Relative %: 78 %
Platelets: 324 10*3/uL (ref 150–400)
RBC: 3.05 MIL/uL — ABNORMAL LOW (ref 3.87–5.11)
RDW: 18.6 % — ABNORMAL HIGH (ref 11.5–15.5)
WBC: 10.7 10*3/uL — ABNORMAL HIGH (ref 4.0–10.5)
nRBC: 0.3 % — ABNORMAL HIGH (ref 0.0–0.2)

## 2022-01-07 LAB — ECHOCARDIOGRAM LIMITED
Calc EF: 45.3 %
Height: 66 in
S' Lateral: 3.3 cm
Single Plane A2C EF: 50.6 %
Single Plane A4C EF: 42.7 %
Weight: 2966.51 oz

## 2022-01-07 LAB — COMPREHENSIVE METABOLIC PANEL
ALT: 35 U/L (ref 0–44)
AST: 34 U/L (ref 15–41)
Albumin: 2.1 g/dL — ABNORMAL LOW (ref 3.5–5.0)
Alkaline Phosphatase: 162 U/L — ABNORMAL HIGH (ref 38–126)
Anion gap: 6 (ref 5–15)
BUN: 12 mg/dL (ref 8–23)
CO2: 24 mmol/L (ref 22–32)
Calcium: 8 mg/dL — ABNORMAL LOW (ref 8.9–10.3)
Chloride: 107 mmol/L (ref 98–111)
Creatinine, Ser: 0.52 mg/dL (ref 0.44–1.00)
GFR, Estimated: >60 mL/min (ref >60)
Glucose, Bld: 114 mg/dL — ABNORMAL HIGH (ref 70–99)
Potassium: 4.3 mmol/L (ref 3.5–5.1)
Sodium: 137 mmol/L (ref 135–145)
Total Bilirubin: 0.5 mg/dL (ref 0.3–1.2)
Total Protein: 7.1 g/dL (ref 6.5–8.1)

## 2022-01-07 LAB — PHOSPHORUS: Phosphorus: 3.9 mg/dL (ref 2.5–4.6)

## 2022-01-07 LAB — GLUCOSE, CAPILLARY
Glucose-Capillary: 118 mg/dL — ABNORMAL HIGH (ref 70–99)
Glucose-Capillary: 101 mg/dL — ABNORMAL HIGH (ref 70–99)

## 2022-01-07 LAB — MAGNESIUM: Magnesium: 1.8 mg/dL (ref 1.7–2.4)

## 2022-01-07 LAB — TRIGLYCERIDES: Triglycerides: 120 mg/dL (ref <150)

## 2022-01-07 MED ORDER — PERFLUTREN LIPID MICROSPHERE
1.0000 mL | INTRAVENOUS | Status: AC | PRN
  Administered 2022-01-07: 2 mL via INTRAVENOUS

## 2022-01-07 MED ORDER — JUVEN PO PACK
1.0000 | PACK | Freq: Two times a day (BID) | ORAL | Status: DC
  Filled 2022-01-07: qty 1

## 2022-01-07 MED ORDER — TRAVASOL 10 % IV SOLN
INTRAVENOUS | Status: AC
  Filled 2022-01-07: qty 451.2

## 2022-01-07 MED ORDER — BOOST / RESOURCE BREEZE PO LIQD CUSTOM: 1.0000 | Freq: Three times a day (TID) | ORAL | Status: DC

## 2022-01-07 NOTE — Care Management Important Message (Signed)
Important Message  Patient Details IM Letter placed in Patients room. Name: Katrina Donaldson MRN: [id number] Date of Birth: 1956-04-02   Medicare Important Message Given:  Yes     Kerin Salen 01/07/2022, 3:21 PM

## 2022-01-07 NOTE — Progress Notes (Addendum)
Progress Note: General Surgery Service   Chief Complaint/Subjective: Denies abd pain.  Says there was a small amount of drainage from her midline wound when she was rolled over the weekend.  Ongoing flatus and bowel movements.    Her chief complaint is still pain over her decubitus ulcers.  She does not like Ensure and states she is eating some Jell-O and ginger ale.  When I ask her what food she likes that contains protein she says she does like cottage cheese and fruit. Objective: Vital signs in last 24 hours: Temp:  [97.8 F (36.6 C)-98.4 F (36.9 C)] 97.8 F (36.6 C) (07/10 0611) Pulse Rate:  [106-109] 109 (07/10 0611) Resp:  [16-18] 16 (07/10 0611) BP: (115-122)/(73-78) 115/73 (07/10 0611) SpO2:  [97 %-99 %] 97 % (07/10 0611) Last BM Date : 01/06/22  Intake/Output from previous day: 07/09 0701 - 07/10 0700 In: 1147.4 [P.O.:240; I.V.:907.4] Out: 1800 [Urine:1800] Intake/Output this shift: Total I/O In: 60 [P.O.:60] Out: 100 [Urine:100]  GI: Abd Soft, incision c/d/I -there is a 4 x 4 gauze folded up over the inferior aspect of her incision with some old serosanguineous drainage.  The wound appears clean and without cellulitis.  Lab Results: CBC  Recent Labs    01/06/22 0306 01/07/22 0305  WBC 10.4 10.7*  HGB 7.9* 8.0*  HCT 22.8* 23.0*  PLT 316 324   BMET Recent Labs    01/06/22 0306 01/07/22 0305  NA 139 137  K 4.4 4.3  CL 109 107  CO2 25 24  GLUCOSE 121* 114*  BUN 13 12  CREATININE 0.44 0.52  CALCIUM 8.1* 8.0*   PT/INR No results for input(s): "LABPROT", "INR" in the last 72 hours. ABG No results for input(s): "PHART", "HCO3" in the last 72 hours.  Invalid input(s): "PCO2", "PO2"  Anti-infectives: Anti-infectives (From admission, onward)    Start     Dose/Rate Route Frequency Ordered Stop   12/28/21 1030  ceFAZolin (ANCEF) IVPB 2g/100 mL premix        2 g 200 mL/hr over 30 Minutes Intravenous  Once 12/28/21 1021 12/28/21 1156   12/28/21 1019   ceFAZolin (ANCEF) 2-4 GM/100ML-% IVPB       Note to Pharmacy: Christell Faith L: cabinet override      12/28/21 1019 12/28/21 1154       Medications: Scheduled Meds:  acetaminophen  1,000 mg Oral Q6H   bisacodyl  10 mg Rectal Daily   Chlorhexidine Gluconate Cloth  6 each Topical Daily   feeding supplement  237 mL Oral TID WC   fluticasone  1 spray Each Nare BID   insulin aspart  0-9 Units Subcutaneous Q8H   levothyroxine  125 mcg Oral Q0600   lip balm   Topical BID   methocarbamol  500 mg Oral Q6H   metoprolol tartrate  5 mg Intravenous Q6H   pantoprazole  40 mg Oral BID   rivaroxaban  20 mg Oral Q supper   sodium chloride flush  10-40 mL Intracatheter Q12H   Continuous Infusions:  dextrose 5 % and 0.45% NaCl 10 mL/hr at 01/03/22 0332   TPN ADULT (ION) 40 mL/hr at 01/06/22 1742   PRN Meds:.albuterol, alum & mag hydroxide-simeth, azelastine, diphenhydrAMINE, hydrALAZINE, hydrocortisone, lip balm, magic mouthwash, menthol-cetylpyridinium, metoprolol tartrate, morphine injection, ondansetron **OR** ondansetron (ZOFRAN) IV, oxyCODONE, phenol, polyvinyl alcohol, simethicone, sodium chloride, sodium chloride flush  Assessment/Plan: Ms. Haack is a 66 year old female who underwent exploratory laparotomy with lysis of adhesions that were causing  an adhesive small bowel obstruction on 12/28/21.    - afebrile, VSS, WBC 10.7 -Bowel function returned, abdomen is nondistended, incision appears well-healing -Cont regular diet, I ordered a calorie count and asked the dietitians to come back and speak with her about additional oral supplements for her diet.  Continue TPN for now  FEN: Regular, tpn ID: none VTE: SCD's, Xeralto for hx DVT/hypercoagulable state Foley: removed 7/3, external cath in place Dispo: med- surg, TRH service.   Hiatal hernia Cardiomyopathy, new acute systolic heart dysfunction - cards following  PMH DVT - acute vs chronic DVT visualized on RLE doppler 12/26/21    LOS:  18 days    Jill Alexanders, PA-C

## 2022-01-07 NOTE — Progress Notes (Signed)
   Echo today personally reviewed- there is improvement in LVEF to 40-45%. As she is now able to take oral meds, will re-evaluate heart failure treatment and make adjustments on rounds in the am tomorrow.  Pixie Casino, MD, [facility], [place] Director of the [facility]  [facility] Diplomate of the American Board of Clinical Lipidology Attending Cardiologist  Direct Dial: [phone]  Fax: [phone]  Website:  [email or web address]

## 2022-01-07 NOTE — Progress Notes (Signed)
Patient's vital signs turned to yellow. Discussed patient's conditions with charge nurse Vernal. Patient is alert and oriented. No pain and discomfort noted on assessment. Will continue monitor.

## 2022-01-07 NOTE — Progress Notes (Signed)
PHARMACY - TOTAL PARENTERAL NUTRITION CONSULT NOTE   Indication: Small bowel obstruction  Patient Measurements: Height: '5\' 6"'$  (167.6 cm) Weight: 84.1 kg (185 lb 6.5 oz) IBW/kg (Calculated) : 59.3 TPN AdjBW (KG): 65.5 Body mass index is 29.93 kg/m.  Assessment:  66 yo F presents with abdominal pain, n/v, and poor appetite x 5 days. 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 No BM for 5 days as well. NG output was 1.6 L and xray shows SBO. Does report shellfish allergy but not any fish. Reacted to shrimp but not crab and was not an anaphylactic reaction. Pharmacy consulted for TPN.  Glucose / Insulin: 6/29: A1C 5.2. No hx of DM. CBGs <180. 1 unit of SSI / 24 hrs. - 6/30 Dexamethasone '4mg'$   Electrolytes: CoCa 9.52. Renal: SCr WNL.   Hepatic:  LFTs normalized today. Tbili 65>120. TG wnl. Albumin low at 2.1. Intake / Output; MIVF: UOP good - PO Intake 240 mL again last 24h. Refusing Ensure. - LBM 7/9 - D5 1/2NS at [facility] [place] / Procedures:  6/30 Exploratory laparotomy and lysis of adhesions.   Central access: PICC placed 6/29 TPN start date: 6/29  Nutritional Goals: Goal TPN rate is 80 mL/hr (provides 90 g of protein and 2,085 kcals per day)  RD Assessment: Estimated Needs Total Energy Estimated Needs: 1950-2150 Total Protein Estimated Needs: 85-95g Total Fluid Estimated Needs: 2L/day  Current Nutrition:  TPN  Changed to regular diet but poor appetite Ensure En[place] ordered TID (but patient refusing) -starting to eat a bit more per primary  Plan:  Continue same TPN at half rate of 26m/hr; Electrolytes in TPN: Na 1061m/L, K 4032mL, Ca 5mE75m, Mg 7mEq60m and Phos 10mmo26m Cl:Ac 1:1 Add standard MVI and trace elements to TPN Sensitive SSI to Q8h and adjust as needed  [facility] labs on Mon/Thurs Regular diet, calorie count   Regena Delucchi S. RobertAlford HighlandmD, BCPS Clinical Staff Pharmacist [email or web address] 01/07/2022 7:12  AM

## 2022-01-07 NOTE — Progress Notes (Signed)
PROGRESS NOTE    Katrina Donaldson  PFX:[id number] DOB: 02/05/1956 DOA: 12/20/2021 PCP: Donald Prose, MD   Brief Narrative:  66 y.o.f w/ history significant of laparoscopic partial colectomy in 01/2021 2/2 cecal mass, thyroidectomy, brain aneurysm, chronic anticoagulation, hypertension, hypothyroidism, CVA, sacral osteomyelitis, wheelchair-bound presented from SNF complaining of abdominal pain, nausea, vomiting, poor appetite  x 5 days, last BM 5 days PTA. In the ED, vital signs noted for tachycardia otherwise fairly stable, labs noted for mild anemia, otherwise stable.  CT abdomen/pelvis showed high-grade SBO likely due to adhesions, large hiatal hernia.  Surgery consulted and patient admitted for further management  Patient has failed to progress despite having NG tube decompression IV fluid hydration. She was noted to have tachycardia abnormal EKG, no chest pain troponins are negative, EKG shows sinus tachycardia. Echocardiogram showed decreased EF 30-35%. Labs with improved/stable renal function, stable hemoglobin at 10.2 g.  Seen by cardiology.  Sbo did not improve subsequently underwent ex laparotomy with lysis of adhesion by Dr Barry Dienes 6/30.patient continued on NG decompression and clamping trial-ng fell off 7/4-keeping it off.   Pt is VERY slow to improve, bowel function slwoly returning but pt requiring TPN, surgery is advancing diet and hoping to stop TPN in next couple days   Assessment & Plan:   Principal Problem:   SBO (small bowel obstruction) (HCC) Active Problems:   Essential hypertension   Anemia, iron deficiency   Hypothyroidism   Hypercoagulable state ([place])   Hypokalemia   Confusion   Chronic anticoagulation   Cecum mass   Sacral osteomyelitis (HCC)   Pressure injury of sacral region, stage 4 (HCC)   AKI (acute kidney injury) ([place])   Pressure ulcer of sacral region, stage 3 (HCC)   Cardiomyopathy ([place])   Bedridden   Incarcerated hiatal hernia   SBO secondary to  adhesions: -s/p ex laparotomy with lysis of adhesion by Dr Barry Dienes 6/30, after it did not improve on conservative management. -Bowel function slowly returned -continue TPN-although at reduced rate as diet advances, but will continue for another day as pt has had minimal po intake.   -Gen surg did liberlaize diet to encourage po intake -Continue remaining plan of care as per surgical service  -Continue pain control DVT prophylaxis and ambulation PT OT.   -Follow-up pathology on Biopsy from mesenteric nodule.     Cardiomyopathy w/ new acute systolic dysfunction with EF 30 to 35% Essential hypertension Sinus tachycardia: -Likely tachycardia mediated acute systolic dysfunction in the setting of acute illness.  -Cardiology following, on metoprolol 5 mg scheduled/prn, monitor in tele. -Had repeat echo today, pending read -will need outpatient CHF follow-up.  CHF is compensated.   -Continue to monitor intake output, watch for fluid overload- noted some wt changes-if symptomatic add lasix,  .  Normocytic anemia Iron deficiency Anemia of chronic disease:  -Hgb  8-stable -Transfuse if less than 7 g.   Large hiatal hernia:  -on PPI twice daily  Leukocytosis postop:  WBC count now downtrending remains afebrile, no UTI.  Foley off.  Monitor  Hypernatremia w/ Hyperchloremia: -Resolved   Hypokalemia  -resolved   Hyperkalemia- -resolved 4.4    GERD: -cont IV PPI.  HLD: -statin on hold.  Acute urine retention-r -resolved. Off foley 7/3  Hypothyroidism:  -cont IV Synthroid  AKI: -resolved. -BUN/Cr 12/0.5  Hypophosphatemia  -resolved.monitor.   History of DVT starting in 2005//Hypercoagulable state Chronic DVT ON [place] 6/28: -Xarelto restarted as pt refusing heparin labs and no immediate procedure planned  Goals of care full code, prognosis remains to be seen given patient's acute multiple medical problems including bowel obstruction and acute systolic dysfunction.   Stage  III pressure ulcer on sacrum POA: Continue wound care and offloading. Ext foley        Pressure Injury 12/20/21 Sacrum Posterior Stage 3 -  Full thickness tissue loss. Subcutaneous fat may be visible but bone, tendon or muscle are NOT exposed. Red (Active)  12/20/21 1950  Location:Sacrum  Location Orientation:Posterior  Staging:Stage 3-Full thickness tissue loss. Subcutaneous fat may be visible but bone, tendon or muscle are NOT exposed.  Wound Description (Comments):Red  Present on Admission:Yes  Dressing Type Foam:Lift dressing to assess site every shift.        DVT prophylaxis: IF:[id number]   Code Status: full    Code Status Orders  (From admission, onward)           Start     Ordered   12/20/21 1811  Full code  Continuous        12/20/21 1810           Code Status History     Date Active Date Inactive Code Status Order ID Comments User Context   01/26/2021 2309 02/14/2021 0818 Full Code [id number]  Kayleen Memos, DO ED   06/03/2017 2108 06/05/2017 2129 Full Code [id number]  Consuella Lose, MD Inpatient   07/24/2016 1847 08/24/2016 1901 Full Code [id number]  Cathlyn Parsons, PA-C Inpatient   07/24/2016 1847 07/24/2016 1847 Full Code [id number]  Cathlyn Parsons, PA-C Inpatient   07/19/2016 2034 07/24/2016 1835 Full Code [id number]  Consuella Lose, MD Inpatient   07/21/2015 1026 07/22/2015 0326 Full Code [id number]  Consuella Lose, MD HOV      Advance Directive Documentation    Flowsheet Row Most Recent Value  Type of Advance Directive Healthcare Power of Attorney  Pre-existing out of facility DNR order (yellow form or pink MOST form) --  "MOST" Form in Place? --      Family Communication: discussed with pt in room  Disposition Plan:   Not yet ready fro d/c, on TPN Consults called: None Admission status: Inpatient   Consultants:  Gen surg  Procedures:  ECHOCARDIOGRAM LIMITED  Result Date: 01/07/2022    ECHOCARDIOGRAM LIMITED REPORT   Patient Name:    Katrina Donaldson Date of Exam: 01/07/2022 Medical Rec #:  [id number]       Height:       66.0 in Accession #:    [id number]      Weight:       185.4 lb Date of Birth:  Aug 23, 1955       BSA:          1.936 m Patient Age:    63 years        BP:           115/73 mmHg Patient Gender: F               HR:           104 bpm. Exam Location:  Inpatient Procedure: 2D Echo, Color Doppler, Cardiac Doppler and Intracardiac            Opacification Agent Indications:    I50.40* Unspecified combined systolic (congestive) and diastolic                 (congestive) heart failure  History:        Patient has prior history of Echocardiogram examinations, most  recent 12/26/2021. Cardiomyopathy, Abnormal ECG, Stroke,                 Signs/Symptoms:Altered Mental Status; Risk Factors:Hypertension.  Sonographer:    Roseanna Rainbow RDCS Referring Phys: [place]  Sonographer Comments: Technically difficult study due to poor echo windows. Image acquisition challenging due to patient body habitus. Difficult due to patient position. IMPRESSIONS  1. Left ventricular ejection fraction, by estimation, is 40 to 45%. The left ventricle has mildly decreased function. The left ventricle demonstrates regional wall motion abnormalities (see scoring diagram/findings for description).  2. Right ventricular systolic function is normal. The right ventricular size is normal.  3. The inferior vena cava is normal in size with greater than 50% respiratory variability, suggesting right atrial pressure of 3 mmHg. FINDINGS  Left Ventricle: Left ventricular ejection fraction, by estimation, is 40 to 45%. The left ventricle has mildly decreased function. The left ventricle demonstrates regional wall motion abnormalities. Definity contrast agent was given IV to delineate the left ventricular endocardial borders. The left ventricular internal cavity size was normal in size. There is no left ventricular hypertrophy.  LV Wall Scoring: The anterior  septum, mid inferoseptal segment, and basal inferoseptal segment are akinetic. The inferior wall is hypokinetic. The entire anterior wall, entire lateral wall, and entire apex are normal. Right Ventricle: The right ventricular size is normal. Right ventricular systolic function is normal. Aorta: The aortic root is normal in size and structure. Venous: The inferior vena cava is normal in size with greater than 50% respiratory variability, suggesting right atrial pressure of 3 mmHg. LEFT VENTRICLE PLAX 2D LVIDd:         4.00 cm LVIDs:         3.30 cm LV PW:         0.80 cm LV IVS:        0.80 cm  LV Volumes (MOD) LV vol d, MOD A2C: 78.8 ml LV vol d, MOD A4C: 87.4 ml LV vol s, MOD A2C: 38.9 ml LV vol s, MOD A4C: 50.1 ml LV SV MOD A2C:     39.9 ml LV SV MOD A4C:     87.4 ml LV SV MOD BP:      38.9 ml LEFT ATRIUM         Index LA diam:    2.40 cm 1.24 cm/m   AORTA Ao Root diam: 3.00 cm Oswaldo Milian MD Electronically signed by Oswaldo Milian MD Signature Date/Time: 01/07/2022/11:49:45 AM    Final    DG CHEST PORT 1 VIEW  Result Date: 12/31/2021 CLINICAL DATA:  Leukocytosis EXAM: PORTABLE CHEST 1 VIEW COMPARISON:  12/21/2021 FINDINGS: Patient rotated to the right. Nasogastric tube terminates in the herniated stomach with side port likely just below the gastroesophageal junction. No pleural effusion or pneumothorax. Large hiatal hernia with abdominal organs positioned in the lower left hemithorax. No congestive failure. Volume loss in the left lung base. Clear right lung. Right PICC line terminates at the superior caval/atrial junction. IMPRESSION: Large hiatal hernia. Volume loss within the adjacent left lung base, similar. Nasogastric tube currently positioned within the herniated stomach. Cardiomegaly without congestive failure. Electronically Signed   By: Abigail Miyamoto M.D.   On: 12/31/2021 09:21   VAS [place] LOWER EXTREMITY VENOUS (DVT)  Result Date: 12/26/2021  Lower Venous DVT Study Patient Name:   Katrina Donaldson  Date of Exam:   12/26/2021 Medical Rec #: [id number]        Accession #:    [id number]  Date of Birth: Feb 08, 1956        Patient Gender: F Patient Age:   66 years Exam Location:  [facility] Procedure:      VAS [place] LOWER EXTREMITY VENOUS (DVT) Referring Phys: [place] --------------------------------------------------------------------------------  Indications: Swelling.  Risk Factors: DVT. Limitations: Poor ultrasound/tissue interface and patient positioning, patient immobility, patient pain tolerance. Comparison Study: 07/25/2016 - - Findings consistent with acute deep vein                   thrombosis involving the                   right common femoral vein, right profunda femoris vein, right                   femoral vein, right popliteal vein, right posterial tibial                   vein,                   and right peroneal vein.                   - Findings consistent with acute superficial vein thrombosis                   involving the right small saphenous vein.                   - No evidence of deep vein thrombosis involving the left lower                   extremity.                   - No evidence of Baker&'s cyst on the right or left. Performing Technologist: Oliver Hum RVT  Examination Guidelines: A complete evaluation includes B-mode imaging, spectral Doppler, color Doppler, and power Doppler as needed of all accessible portions of each vessel. Bilateral testing is considered an integral part of a complete examination. Limited examinations for reoccurring indications may be performed as noted. The reflux portion of the exam is performed with the patient in reverse Trendelenburg.  +---------+---------------+---------+-----------+----------+--------------+ RIGHT    CompressibilityPhasicitySpontaneityPropertiesThrombus Aging +---------+---------------+---------+-----------+----------+--------------+ CFV      Partial        Yes      Yes                  Chronic         +---------+---------------+---------+-----------+----------+--------------+ SFJ      Full                                                        +---------+---------------+---------+-----------+----------+--------------+ FV Prox  Full                                                        +---------+---------------+---------+-----------+----------+--------------+ FV Mid                  Yes      Yes                                 +---------+---------------+---------+-----------+----------+--------------+  FV DistalFull                                                        +---------+---------------+---------+-----------+----------+--------------+ PFV      Full                                                        +---------+---------------+---------+-----------+----------+--------------+ POP      Full           Yes      Yes                                 +---------+---------------+---------+-----------+----------+--------------+ PTV      Full                                                        +---------+---------------+---------+-----------+----------+--------------+ PERO     Full                                                        +---------+---------------+---------+-----------+----------+--------------+   +---------+---------------+---------+-----------+----------+-------------------+ LEFT     CompressibilityPhasicitySpontaneityPropertiesThrombus Aging      +---------+---------------+---------+-----------+----------+-------------------+ CFV      Full           Yes      Yes                                      +---------+---------------+---------+-----------+----------+-------------------+ SFJ      Full                                                             +---------+---------------+---------+-----------+----------+-------------------+ FV Prox  Full                                                              +---------+---------------+---------+-----------+----------+-------------------+ FV Mid   Full                                                             +---------+---------------+---------+-----------+----------+-------------------+ FV Distal               Yes  Yes                                      +---------+---------------+---------+-----------+----------+-------------------+ PFV      Full                                                             +---------+---------------+---------+-----------+----------+-------------------+ POP      Full           Yes      Yes                                      +---------+---------------+---------+-----------+----------+-------------------+ PTV      Full                                                             +---------+---------------+---------+-----------+----------+-------------------+ PERO                                                  Not well visualized +---------+---------------+---------+-----------+----------+-------------------+     Summary: RIGHT: - 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 - No cystic structure found in the popliteal fossa.  LEFT: - There is no evidence of deep vein thrombosis in the lower extremity. However, portions of this examination were limited- see technologist comments above.  - No cystic structure found in the popliteal fossa.  *See table(s) above for measurements and observations. Electronically signed by Orlie Pollen on 12/26/2021 at 5:31:11 PM.    Final    ECHOCARDIOGRAM COMPLETE  Result Date: 12/26/2021    ECHOCARDIOGRAM REPORT   Patient Name:   Katrina Donaldson Date of Exam: 12/26/2021 Medical Rec #:  [id number]       Height:       66.0 in Accession #:    [id number]      Weight:       177.2 lb Date of Birth:  Mar 22, 1956       BSA:          1.900 m Patient Age:    71 years        BP:           125/82 mmHg Patient Gender: F                HR:           120 bpm. Exam Location:  Inpatient Procedure: 2D Echo, Color Doppler and Cardiac Doppler Indications:    Abnormal ekg  History:        Patient has no prior history of Echocardiogram examinations.  Sonographer:    Joette Catching RCS Referring Phys: [id number] [place]  Sonographer Comments: Technically challenging study due to limited acoustic windows. Supine scan. IMPRESSIONS  1. Left ventricular ejection fraction, by estimation, is 30 to 35%. The  left ventricle has moderately decreased function. The left ventricle demonstrates global hypokinesis. Left ventricular diastolic parameters are consistent with Grade I diastolic dysfunction (impaired relaxation).  2. Right ventricular systolic function is normal. The right ventricular size is normal.  3. The mitral valve is normal in structure. No evidence of mitral valve regurgitation. No evidence of mitral stenosis.  4. The aortic valve is calcified. There is mild calcification of the aortic valve. There is mild thickening of the aortic valve. Aortic valve regurgitation is not visualized. Aortic valve sclerosis is present, with no evidence of aortic valve stenosis.  5. The inferior vena cava is normal in size with greater than 50% respiratory variability, suggesting right atrial pressure of 3 mmHg. FINDINGS  Left Ventricle: Left ventricular ejection fraction, by estimation, is 30 to 35%. The left ventricle has moderately decreased function. The left ventricle demonstrates global hypokinesis. The left ventricular internal cavity size was normal in size. There is no left ventricular hypertrophy. Abnormal (paradoxical) septal motion, consistent with left bundle branch block. Left ventricular diastolic parameters are consistent with Grade I diastolic dysfunction (impaired relaxation). Right Ventricle: The right ventricular size is normal. No increase in right ventricular wall thickness. Right ventricular systolic function is normal. Left Atrium:  Left atrial size was normal in size. Right Atrium: Right atrial size was normal in size. Pericardium: There is no evidence of pericardial effusion. Mitral Valve: The mitral valve is normal in structure. No evidence of mitral valve regurgitation. No evidence of mitral valve stenosis. Tricuspid Valve: The tricuspid valve is normal in structure. Tricuspid valve regurgitation is not demonstrated. No evidence of tricuspid stenosis. Aortic Valve: The aortic valve is calcified. There is mild calcification of the aortic valve. There is mild thickening of the aortic valve. Aortic valve regurgitation is not visualized. Aortic valve sclerosis is present, with no evidence of aortic valve stenosis. Aortic valve mean gradient measures 4.0 mmHg. Aortic valve peak gradient measures 6.2 mmHg. Aortic valve area, by VTI measures 2.26 cm. Pulmonic Valve: The pulmonic valve was normal in structure. Pulmonic valve regurgitation is not visualized. No evidence of pulmonic stenosis. Aorta: The aortic root is normal in size and structure. Venous: The inferior vena cava is normal in size with greater than 50% respiratory variability, suggesting right atrial pressure of 3 mmHg. IAS/Shunts: No atrial level shunt detected by color flow Doppler.  LEFT VENTRICLE PLAX 2D LVIDd:         3.80 cm   Diastology LVIDs:         3.30 cm   LV e' medial:    5.22 cm/s LV PW:         1.00 cm   LV E/e' medial:  10.0 LV IVS:        0.90 cm   LV e' lateral:   5.77 cm/s LVOT diam:     2.30 cm   LV E/e' lateral: 9.1 LV SV:         30 LV SV Index:   16 LVOT Area:     4.15 cm  RIGHT VENTRICLE             IVC RV S prime:     29.70 cm/s  IVC diam: 1.70 cm TAPSE (M-mode): 2.1 cm LEFT ATRIUM             Index LA diam:        2.10 cm 1.11 cm/m LA Vol (A2C):   20.0 ml 10.53 ml/m LA Vol (A4C):   57.6 ml 30.32  ml/m LA Biplane Vol: 35.8 ml 18.84 ml/m  AORTIC VALVE                    PULMONIC VALVE AV Area (Vmax):    2.08 cm     PV Vmax:          1.15 m/s AV Area  (Vmean):   2.10 cm     PV Peak grad:     5.3 mmHg AV Area (VTI):     2.26 cm     PR End Diast Vel: 10.76 msec AV Vmax:           125.00 cm/s AV Vmean:          94.000 cm/s AV VTI:            0.134 m AV Peak Grad:      6.2 mmHg AV Mean Grad:      4.0 mmHg LVOT Vmax:         62.70 cm/s LVOT Vmean:        47.600 cm/s LVOT VTI:          0.073 m LVOT/AV VTI ratio: 0.54  AORTA Ao Root diam: 3.40 cm Ao Asc diam:  3.30 cm MITRAL VALVE               TRICUSPID VALVE MV Area (PHT): 5.84 cm    TR Peak grad:   25.0 mmHg MV Decel Time: 130 msec    TR Vmax:        250.00 cm/s MV E velocity: 52.30 cm/s MV A velocity: 78.40 cm/s  SHUNTS MV E/A ratio:  0.67        Systemic VTI:  0.07 m                            Systemic Diam: 2.30 cm Candee Furbish MD Electronically signed by Candee Furbish MD Signature Date/Time: 12/26/2021/5:05:24 PM    Final    [place] EKG SITE RITE  Result Date: 12/26/2021 If Site Rite image not attached, placement could not be confirmed due to current cardiac rhythm.  DG Abd Portable 1V  Result Date: 12/26/2021 CLINICAL DATA:  Small-bowel obstruction EXAM: PORTABLE ABDOMEN - 1 VIEW COMPARISON:  12/25/2021 FINDINGS: Persistent dilated loops of small bowel particularly in the left abdomen. Maximum measurable distension is decreased. Calcified uterine fibroid is again noted. IMPRESSION: Persistent small bowel obstruction. Maximum distension measures decreased. Electronically Signed   By: Macy Mis M.D.   On: 12/26/2021 08:24   DG Abd Portable 1V  Result Date: 12/25/2021 CLINICAL DATA:  Small bowel obstruction. EXAM: PORTABLE ABDOMEN - 1 VIEW COMPARISON:  Abdominal x-ray from yesterday. FINDINGS: Enteric tube incompletely visualized looped in the left chest, presumably within the large hiatal hernia as seen on CT. Multiple dilated loops of small bowel are unchanged. Calcified uterine fibroid again noted. No acute osseous abnormality. IMPRESSION: 1. Unchanged small bowel obstruction. Electronically Signed    By: Titus Dubin M.D.   On: 12/25/2021 10:31   DG Abd 1 View  Result Date: 12/24/2021 CLINICAL DATA:  None hour delayed film.  Small bowel obstruction. EXAM: ABDOMEN - 1 VIEW COMPARISON:  12/23/2021 FINDINGS: Continued small bowel dilatation compatible with small bowel obstruction. Likely not significantly changed. No visible free air. Large calcified fibroid in the right side of the pelvis. NG tube is not visualized. This likely projects off the superior aspect of the image as the upper abdomen is  not included on this single image. IMPRESSION: Continued small bowel obstruction pattern, not significantly changed. Electronically Signed   By: Rolm Baptise M.D.   On: 12/24/2021 23:14   DG Abd Portable 1V  Result Date: 12/23/2021 CLINICAL DATA:  Nasogastric tube placement. Small-bowel obstruction. EXAM: PORTABLE ABDOMEN - 1 VIEW COMPARISON:  Prior today FINDINGS: A nasogastric tube is now seen which appears coiled within a hiatal hernia. Moderately dilated small bowel loops show mild decrease in dilatation since prior exam. IMPRESSION: Nasogastric tube appears coiled within a hiatal hernia. Mildly decreased dilatation of small bowel loops since prior exam. Electronically Signed   By: Marlaine Hind M.D.   On: 12/23/2021 16:27   DG Abd Portable 1V-Small Bowel Obstruction Protocol-24 hr delay  Result Date: 12/23/2021 CLINICAL DATA:  Small bowel obstruction protocol, 24 hour delayed image. EXAM: PORTABLE ABDOMEN - 1 VIEW COMPARISON:  Abdominal x-rays 12/22/2021. FINDINGS: Contrast material remains in small bowel loops in the left lower quadrant. No colonic contrast identified. Small bowel loops are air-filled and dilated measuring up to 6.7 cm, similar to the prior study. Calcified uterine fibroid again noted. IMPRESSION: 1. Contrast material remains in small bowel loops in the left lower quadrant. Dilated small bowel is unchanged. Findings are compatible with small-bowel obstruction. Electronically Signed    By: Ronney Asters M.D.   On: 12/23/2021 15:27   DG Abd Portable 1V-Small Bowel Obstruction Protocol-initial, 8 hr delay  Result Date: 12/22/2021 CLINICAL DATA:  8 hour small-bowel follow up EXAM: PORTABLE ABDOMEN - 1 VIEW COMPARISON:  Film from earlier in the same day. FINDINGS: Multiple loops of dilated small bowel are again identified. Previously administered contrast lies predominately within the mid to distal small bowel. No colonic contrast is noted. 24 hour follow-up is recommended. Calcified uterine fibroid is seen. IMPRESSION: Administered contrast lies within the dilated small bowel. No colonic contrast is seen. 24 hour follow-up is recommended. Electronically Signed   By: Inez Catalina M.D.   On: 12/22/2021 22:11   DG Abd Portable 1V  Result Date: 12/22/2021 CLINICAL DATA:  Small-bowel obstruction EXAM: PORTABLE ABDOMEN - 1 VIEW COMPARISON:  12/20/2021 FINDINGS: NG tube in the stomach with the tip near the antrum. Dilated small bowel loops similar to the prior study compatible with small bowel obstruction. Colon decompressed. Calcified uterine fibroid in the right pelvis. IMPRESSION: NG tube in the gastric antrum.  Small bowel obstruction unchanged. Electronically Signed   By: Franchot Gallo M.D.   On: 12/22/2021 11:29   DG Chest Port 1 View  Result Date: 12/21/2021 CLINICAL DATA:  [id number] fever EXAM: PORTABLE CHEST 1 VIEW COMPARISON:  Chest x-ray from yesterday nausea correlation is made with a CT of the abdomen and pelvis from yesterday FINDINGS: There has been interval insertion of an NG tube with its distal end at the right upper-mid abdomen. There has been interval partial decompression of the dilated stomach at the left lower thorax. Large hiatal hernia containing the stomach extending up to the left mid thorax. Left basilar atelectasis. Cardiomegaly. Right lung remains clear. Moderately severe dilatation of the small bowel loops of small bowel obstruction. IMPRESSION: There has been  interval insertion of an NG tube with its distal end of the right upper-mid abdomen. There has been interval partial decompression of the herniated stomach through the hiatal hernia extending up to the mid left hemithorax. Left basilar atelectasis. Findings of high-grade small-bowel obstruction. Electronically Signed   By: Frazier Richards M.D.   On: 12/21/2021 08:57  DG Abd Portable 1V-Small Bowel Protocol-Position Verification  Result Date: 12/20/2021 CLINICAL DATA:  NG tube placement EXAM: PORTABLE ABDOMEN - 1 VIEW COMPARISON:  CT 12/20/2021 FINDINGS: Elevated left diaphragm with large hiatal hernia and intrathoracic stomach. Esophageal tube tip is below the diaphragm and probably in the region of gastroduodenal junction. Multiple loops of dilated small bowel consistent with an obstruction IMPRESSION: 1. Esophageal tube tip likely over the gastroduodenal junction 2. Elevated left diaphragm with large hiatal hernia and intrathoracic stomach. Subsegmental atelectasis at the left base. 3. Multiple dilated loops of small bowel consistent with an obstruction Electronically Signed   By: Donavan Foil M.D.   On: 12/20/2021 23:24   CT ABDOMEN PELVIS W CONTRAST  Result Date: 12/20/2021 CLINICAL DATA:  Abdominal pain, acute, nonlocalized n/v, no BM x5 days, diffuse abd pain lower EXAM: CT ABDOMEN AND PELVIS WITH CONTRAST TECHNIQUE: Multidetector CT imaging of the abdomen and pelvis was performed using the standard protocol following bolus administration of intravenous contrast. RADIATION DOSE REDUCTION: This exam was performed according to the departmental dose-optimization program which includes automated exposure control, adjustment of the mA and/or kV according to patient size and/or use of iterative reconstruction technique. CONTRAST:  16m OMNIPAQUE IOHEXOL 300 MG/ML  SOLN COMPARISON:  CT 02/01/2021 FINDINGS: Lower chest: Bibasilar atelectasis. Unchanged elevated left hemidiaphragm. Dilated and fluid-filled  visualized esophagus. Hepatobiliary: No focal liver abnormality is seen. Mild gallbladder distension. Pancreas: Unremarkable. No pancreatic ductal dilatation or surrounding inflammatory changes. Spleen: Normal in size without focal abnormality. Adrenals/Urinary Tract: Adrenal glands are unremarkable. No hydronephrosis. There are nonobstructing left lower pole renal stones largest measuring up to 4 mm. The bladder is moderately distended. Stomach/Bowel: Large hiatal hernia with dilated partially intrathoracic stomach. Multiple dilated loops of small bowel with transition point in the right upper abdomen (series 2, image 38, series 6, image 29-30). There is mesenteric edema.Prior right hemicolectomy with ileocolic anastomosis. Vascular/Lymphatic: Scattered atherosclerosis. No AAA. No lymphadenopathy. Reproductive: There is a subserosal or pedunculated calcified uterine fibroid which now appears on the right, likely due to rightward mass effect on the uterus by ascites and previously described small bowel obstruction. Other: Small volume abdominopelvic ascites.  No free air. Musculoskeletal: No acute osseous abnormality. Unchanged anterolisthesis at L4-L5. Moderate bilateral hip osteoarthritis. 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 Chronic changes at the origin of the left rectus femoris. There is diffuse muscle atrophy. IMPRESSION: High-grade small-bowel obstruction with transition point in the right upper abdomen, likely due to adhesions. Large hiatal hernia with dilated partially intrathoracic stomach related to the SBO. Small volume abdominopelvic ascites. Additional chronic findings as described above. Electronically Signed   By: JMaurine SimmeringM.D.   On: 12/20/2021 15:27   DG Chest Portable 1 View  Result Date: 12/20/2021 CLINICAL DATA:  Abdominal pain and vomiting. Small-bowel obstruction. EXAM: PORTABLE CHEST 1 VIEW COMPARISON:  CT of the abdomen and pelvis 02/01/2021 FINDINGS: The  heart is enlarged. Chronic elevation of left hemidiaphragm present. The stomach is within the hernia, dilated. Additional loops of bowel are present within the hernia. Moderate distension of small bowel noted in the abdomen. IMPRESSION: 1. Moderate distension of small bowel compatible with small bowel obstruction or ileus. Dedicated imaging of the abdomen scratched at dedicated radiographs of the abdomen or CT of the abdomen pelvis would be useful for further evaluation. 2. Chronic elevation of left hemidiaphragm/hernia. 3. Cardiomegaly. Electronically Signed   By: CSan MorelleM.D.   On: 12/20/2021 13:03  Subjective: Pt continues to improve slowly  Objective: Vitals:   01/06/22 1141 01/06/22 2045 01/07/22 0611 01/07/22 1300  BP: 119/78 122/77 115/73   Pulse: (!) 106 (!) 108 (!) 109   Resp: '18 16 16   '$ Temp: 98.1 F (36.7 C) 98.4 F (36.9 C) 97.8 F (36.6 C) 98.2 F (36.8 C)  TempSrc: Oral Oral Oral   SpO2: 99% 98% 97%   Weight:      Height:        Intake/Output Summary (Last 24 hours) at 01/07/2022 1319 Last data filed at 01/07/2022 1000 Gross per 24 hour  Intake 1207.41 ml  Output 900 ml  Net 307.41 ml   Filed Weights   12/23/21 0600 12/26/21 1230 12/28/21 0949  Weight: 80.4 kg 84.1 kg 84.1 kg    Examination:  General exam: Appears calm and comfortable , chrnically ill-appearing Respiratory system: Clear to auscultation. Respiratory effort normal. Cardiovascular system: S1 & S2 heard, RRR. No JVD, murmurs, rubs, gallops or clicks. No pedal edema. Gastrointestinal system: Abdomen is nondistended, soft and nontender. Mild bowel sounds Central nervous system: Alert and oriented. No new focal neurological deficits. Extremities: wwp, nv intact Skin: No rashes, lesions or ulcers Psychiatry: Judgement and insight appear normal. Mood & affect flat.     Data Reviewed: I have personally reviewed following labs and imaging studies  CBC: Recent Labs  Lab  01/03/22 0452 01/04/22 0541 01/05/22 0622 01/06/22 0306 01/07/22 0305  WBC 13.7* 11.8* 11.6* 10.4 10.7*  NEUTROABS 11.4* 9.7* 9.4* 8.2* 8.3*  HGB 8.3* 8.4* 7.8* 7.9* 8.0*  HCT 23.9* 24.6* 22.1* 22.8* 23.0*  MCV 75.2* 75.5* 75.4* 74.8* 75.4*  PLT 300 311 318 316 676   Basic Metabolic Panel: Recent Labs  Lab 01/02/22 0458 01/02/22 1219 01/03/22 0452 01/04/22 0541 01/06/22 0306 01/07/22 0305  NA 132*  --  132* 135 139 137  K 5.7* 4.8 4.4 4.0 4.4 4.3  CL 98  --  98 102 109 107  CO2 24  --  '27 26 25 24  '$ GLUCOSE 104*  --  141* 119* 121* 114*  BUN 14  --  '14 15 13 12  '$ CREATININE 0.50  --  0.45 0.43* 0.44 0.52  CALCIUM 8.4*  --  8.2* 8.2* 8.1* 8.0*  MG 2.1  --  1.9 1.9 2.0 1.8  PHOS 4.8*  --  4.5 3.9 3.6 3.9   GFR: Estimated Creatinine Clearance: 73.5 mL/min (by C-G formula based on SCr of 0.52 mg/dL). Liver Function Tests: Recent Labs  Lab 01/03/22 0452 01/04/22 0541 01/07/22 0305  AST 45* 56* 34  ALT 39 50* 35  ALKPHOS 156* 179* 162*  BILITOT 0.6 0.7 0.5  PROT 7.3 7.4 7.1  ALBUMIN 2.2* 2.2* 2.1*   No results for input(s): "LIPASE", "AMYLASE" in the last 168 hours. No results for input(s): "AMMONIA" in the last 168 hours. Coagulation Profile: No results for input(s): "INR", "PROTIME" in the last 168 hours. Cardiac Enzymes: No results for input(s): "CKTOTAL", "CKMB", "CKMBINDEX", "TROPONINI" in the last 168 hours. BNP (last 3 results) No results for input(s): "PROBNP" in the last 8760 hours. HbA1C: No results for input(s): "HGBA1C" in the last 72 hours. CBG: Recent Labs  Lab 01/06/22 0012 01/06/22 0749 01/06/22 1538 01/06/22 2310 01/07/22 0729  GLUCAP 113* 132* 129* 119* 118*   Lipid Profile: Recent Labs    01/07/22 0305  TRIG 120   Thyroid Function Tests: No results for input(s): "TSH", "T4TOTAL", "FREET4", "T3FREE", "THYROIDAB" in the last 72 hours.  Anemia Panel: No results for input(s): "VITAMINB12", "FOLATE", "FERRITIN", "TIBC", "IRON",  "RETICCTPCT" in the last 72 hours. Sepsis Labs: No results for input(s): "PROCALCITON", "LATICACIDVEN" in the last 168 hours.  No results found for this or any previous visit (from the past 240 hour(s)).       Radiology Studies: ECHOCARDIOGRAM LIMITED  Result Date: 01/07/2022    ECHOCARDIOGRAM LIMITED REPORT   Patient Name:   Katrina Donaldson Date of Exam: 01/07/2022 Medical Rec #:  [id number]       Height:       66.0 in Accession #:    [id number]      Weight:       185.4 lb Date of Birth:  03-22-1956       BSA:          1.936 m Patient Age:    27 years        BP:           115/73 mmHg Patient Gender: F               HR:           104 bpm. Exam Location:  Inpatient Procedure: 2D Echo, Color Doppler, Cardiac Doppler and Intracardiac            Opacification Agent Indications:    I50.40* Unspecified combined systolic (congestive) and diastolic                 (congestive) heart failure  History:        Patient has prior history of Echocardiogram examinations, most                 recent 12/26/2021. Cardiomyopathy, Abnormal ECG, Stroke,                 Signs/Symptoms:Altered Mental Status; Risk Factors:Hypertension.  Sonographer:    Roseanna Rainbow RDCS Referring Phys: [place]  Sonographer Comments: Technically difficult study due to poor echo windows. Image acquisition challenging due to patient body habitus. Difficult due to patient position. IMPRESSIONS  1. Left ventricular ejection fraction, by estimation, is 40 to 45%. The left ventricle has mildly decreased function. The left ventricle demonstrates regional wall motion abnormalities (see scoring diagram/findings for description).  2. Right ventricular systolic function is normal. The right ventricular size is normal.  3. The inferior vena cava is normal in size with greater than 50% respiratory variability, suggesting right atrial pressure of 3 mmHg. FINDINGS  Left Ventricle: Left ventricular ejection fraction, by estimation, is 40 to 45%. The left  ventricle has mildly decreased function. The left ventricle demonstrates regional wall motion abnormalities. Definity contrast agent was given IV to delineate the left ventricular endocardial borders. The left ventricular internal cavity size was normal in size. There is no left ventricular hypertrophy.  LV Wall Scoring: The anterior septum, mid inferoseptal segment, and basal inferoseptal segment are akinetic. The inferior wall is hypokinetic. The entire anterior wall, entire lateral wall, and entire apex are normal. Right Ventricle: The right ventricular size is normal. Right ventricular systolic function is normal. Aorta: The aortic root is normal in size and structure. Venous: The inferior vena cava is normal in size with greater than 50% respiratory variability, suggesting right atrial pressure of 3 mmHg. LEFT VENTRICLE PLAX 2D LVIDd:         4.00 cm LVIDs:         3.30 cm LV PW:         0.80 cm LV IVS:  0.80 cm  LV Volumes (MOD) LV vol d, MOD A2C: 78.8 ml LV vol d, MOD A4C: 87.4 ml LV vol s, MOD A2C: 38.9 ml LV vol s, MOD A4C: 50.1 ml LV SV MOD A2C:     39.9 ml LV SV MOD A4C:     87.4 ml LV SV MOD BP:      38.9 ml LEFT ATRIUM         Index LA diam:    2.40 cm 1.24 cm/m   AORTA Ao Root diam: 3.00 cm Oswaldo Milian MD Electronically signed by Oswaldo Milian MD Signature Date/Time: 01/07/2022/11:49:45 AM    Final         Scheduled Meds:  acetaminophen  1,000 mg Oral Q6H   bisacodyl  10 mg Rectal Daily   Chlorhexidine Gluconate Cloth  6 each Topical Daily   feeding supplement  1 Container Oral TID BM   fluticasone  1 spray Each Nare BID   insulin aspart  0-9 Units Subcutaneous Q8H   levothyroxine  125 mcg Oral Q0600   lip balm   Topical BID   methocarbamol  500 mg Oral Q6H   metoprolol tartrate  5 mg Intravenous Q6H   nutrition supplement (JUVEN)  1 packet Oral BID BM   pantoprazole  40 mg Oral BID   rivaroxaban  20 mg Oral Q supper   sodium chloride flush  10-40 mL  Intracatheter Q12H   Continuous Infusions:  dextrose 5 % and 0.45% NaCl 10 mL/hr at 01/03/22 0332   TPN ADULT (ION) 40 mL/hr at 01/06/22 1742   TPN ADULT (ION)       LOS: 18 days    Time spent: 35 min    Nicolette Bang, MD [facility]  If 7PM-7AM, please contact night-coverage  01/07/2022, 1:19 PM

## 2022-01-07 NOTE — Progress Notes (Signed)
  Echocardiogram 2D Echocardiogram has been performed.  Katrina Donaldson 01/07/2022, 11:35 AM

## 2022-01-07 NOTE — TOC Progression Note (Signed)
Transition of Care [facility]) - Progression Note   Patient Details  Name: Katrina Donaldson MRN: [id number] Date of Birth: Jun 05, 1956  Transition of Care [facility]) CM/SW [place], LCSW Phone Number: 01/07/2022, 10:22 AM  Clinical Narrative: Patient continues to not be medically stable to for discharge back to [facility]. TOC to follow.  Expected Discharge Plan: [place] Barriers to Discharge: Continued Medical Work up  Expected Discharge Plan and Services Expected Discharge Plan: [place] In-house Referral: Clinical Social Work Post Acute Care Choice: [place] Living arrangements for the past 2 months: [place]           DME Arranged: N/A DME Agency: NA  Readmission Risk Interventions     No data to display

## 2022-01-07 NOTE — Progress Notes (Signed)
Nutrition Follow-up  INTERVENTION:   -Boost Breeze po TID, each supplement provides 250 kcal and 9 grams of protein   -1 packet Juven BID, each packet provides 95 calories, 2.5 grams of protein (collagen), and 9.8 grams of carbohydrate (3 grams sugar); also contains 7 grams of L-arginine and L-glutamine, 300 mg vitamin C, 15 mg vitamin E, 1.2 mcg vitamin B-12, 9.5 mg zinc, 200 mg calcium, and 1.5 g  Calcium Beta-hydroxy-Beta-methylbutyrate to support wound healing   -Daily snack ordered  -TPN management per Pharmacy  -48 hour Bruce from outside hospital available as pt on regular diet  NUTRITION DIAGNOSIS:   Inadequate oral intake related to acute illness, nausea, vomiting as evidenced by NPO status.  Now on regular diet. Poor PO continues.  GOAL:   Patient will meet greater than or equal to 90% of their needs  Meeting ~50% of needs with TPN.  MONITOR:   Diet advancement, Labs, Weight trends, Skin, I & O's, Other (Comment) (TPN regimen), PO intake, supplement acceptance  REASON FOR ASSESSMENT:   Consult Assessment of nutrition requirement/status, Calorie Count  ASSESSMENT:   66 y.o. 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  Patient in room with visitors at bedside. Pt mainly consuming water and juice. Was unable to tell me how many juice containers she has drank. Pt does not like Ensure supplements. Not liking the food provided here at the hospital. Only ordered 1 meal yesterday and did not like it since it had brown gravy added. Pt willing to try Boost Breeze supplements again if provided over ice. D/c Ensure. Have added daily snack of cottage cheese with pears.    Visitors at bedside offering to bring pt dinner tonight from [employer]. Pt really wants fish. States she may want some fish with hushpuppies.   TPN now infusing at half rate of 40 ml/hr, providing 1043 kcals and 45 g protein.   Admission weight: 175 lbs. Current weight: 185 lbs  Medications: Dulcolax  Labs reviewed: CBGs: 118-129  Diet Order:   Diet Order             Diet regular Room service appropriate? Yes; Fluid consistency: Thin  Diet effective now                   EDUCATION NEEDS:   Not appropriate for education at this time  Skin:  Skin Assessment: Skin Integrity Issues: Skin Integrity Issues:: Incisions Stage III: sacrum Incisions: 6/30: abdomen  Last BM:  7/9 -type 6  Height:   Ht Readings from Last 1 Encounters:  12/28/21 '5\' 6"'$  (1.676 m)    Weight:   Wt Readings from Last 1 Encounters:  12/28/21 84.1 kg    BMI:  Body mass index is 29.93 kg/m.  Estimated Nutritional Needs:   Kcal:  1950-2150  Protein:  85-95g  Fluid:  2L/day    Clayton Bibles, MS, RD, LDN Inpatient Clinical Dietitian Contact information available via Amion

## 2022-01-08 DIAGNOSIS — K56609 Unspecified intestinal obstruction, unspecified as to partial versus complete obstruction: Secondary | ICD-10-CM | POA: Diagnosis not present

## 2022-01-08 LAB — CBC WITH DIFFERENTIAL/PLATELET
Abs Immature Granulocytes: 0.06 10*3/uL (ref 0.00–0.07)
Basophils Absolute: 0 10*3/uL (ref 0.0–0.1)
Basophils Relative: 0 %
Eosinophils Absolute: 0.5 10*3/uL (ref 0.0–0.5)
Eosinophils Relative: 6 %
HCT: 23.7 % — ABNORMAL LOW (ref 36.0–46.0)
Hemoglobin: 8.3 g/dL — ABNORMAL LOW (ref 12.0–15.0)
Immature Granulocytes: 1 %
Lymphocytes Relative: 12 %
Lymphs Abs: 1.1 10*3/uL (ref 0.7–4.0)
MCH: 26.6 pg (ref 26.0–34.0)
MCHC: 35 g/dL (ref 30.0–36.0)
MCV: 76 fL — ABNORMAL LOW (ref 80.0–100.0)
Monocytes Absolute: 0.5 10*3/uL (ref 0.1–1.0)
Monocytes Relative: 5 %
Neutro Abs: 6.7 10*3/uL (ref 1.7–7.7)
Neutrophils Relative %: 76 %
Platelets: 356 10*3/uL (ref 150–400)
RBC: 3.12 MIL/uL — ABNORMAL LOW (ref 3.87–5.11)
RDW: 19.1 % — ABNORMAL HIGH (ref 11.5–15.5)
WBC: 8.8 10*3/uL (ref 4.0–10.5)
nRBC: 0.6 % — ABNORMAL HIGH (ref 0.0–0.2)

## 2022-01-08 LAB — SURGICAL PATHOLOGY

## 2022-01-08 LAB — GLUCOSE, CAPILLARY
Glucose-Capillary: 109 mg/dL — ABNORMAL HIGH (ref 70–99)
Glucose-Capillary: 110 mg/dL — ABNORMAL HIGH (ref 70–99)
Glucose-Capillary: 110 mg/dL — ABNORMAL HIGH (ref 70–99)

## 2022-01-08 MED ORDER — SODIUM CHLORIDE 0.9 % IV BOLUS
500.0000 mL | Freq: Once | INTRAVENOUS | Status: AC
  Administered 2022-01-08: 500 mL via INTRAVENOUS

## 2022-01-08 MED ORDER — METOPROLOL TARTRATE 12.5 MG HALF TABLET
12.5000 mg | ORAL_TABLET | Freq: Two times a day (BID) | ORAL | Status: DC
  Administered 2022-01-08 – 2022-01-10 (×5): 12.5 mg via ORAL
  Filled 2022-01-08 (×5): qty 1

## 2022-01-08 MED ORDER — ATORVASTATIN CALCIUM 10 MG PO TABS
5.0000 mg | ORAL_TABLET | Freq: Every day | ORAL | Status: DC
  Administered 2022-01-08 – 2022-01-09 (×2): 5 mg via ORAL
  Filled 2022-01-08 (×2): qty 1

## 2022-01-08 MED ORDER — MELATONIN 3 MG PO TABS
3.0000 mg | ORAL_TABLET | Freq: Every day | ORAL | Status: DC
  Administered 2022-01-08 – 2022-01-09 (×2): 3 mg via ORAL
  Filled 2022-01-08 (×2): qty 1

## 2022-01-08 NOTE — Progress Notes (Signed)
PT Cancellation Note  Patient Details Name: Katrina Donaldson MRN: [id number] DOB: [birth year]   Cancelled Treatment:    Reason Eval/Treat Not Completed: PT screened, no needs identified, will sign off, Patient is bedbound and from LTC at [facility].  [place] Office [phone] Weekend [phone]    [name] 01/08/2022, 7:35 AM

## 2022-01-08 NOTE — Progress Notes (Signed)
Calorie Count Note  48 hour calorie count ordered.  Diet: Regular Supplements:  -Boost Breeze po TID, each supplement provides 250 kcal and 9 grams of protein -Juven BID, each serving provides 95kcal and 2.5g of protein (amino acids glutamine and arginine)   7/10-7/11 Breakfast (7/11): 10% -insignificant amount Lunch: 0% Dinner: Had dinner from [employer] brought but pt ate a bite of fish. Supplements: 0% Pt states she ate cottage cheese and fruit but this was not documented by staff to confirm. An untouched container of cottage cheese was in room at time of visit.   **Patient taking bites of food then saying she doesn't like it or want it. Not interested in anything on hospital menu. RD has offered all supplements we have to order and pt has politely refused.  Pt will likely not meet nutritional needs.  Brother at bedside, encouraged food from outside be brought to patient. Pt states she would try chinese or [employer] chicken.   Nutrition Dx: Inadequate oral intake related to acute illness, nausea, vomiting as evidenced by NPO status.  Goal: Pt to meet >/= 90% of their estimated nutrition needs   Intervention:  -Daily snack ordered -Calorie Count -Encouraged family to bring pt some food from outside -brother at bedside -D/c Boost and Juven as she refuses to take these  Clayton Bibles, MS, RD, LDN Inpatient Clinical Dietitian Contact information available via Amion

## 2022-01-08 NOTE — Progress Notes (Signed)
PROGRESS NOTE    Katrina Donaldson  HFW:[id number] DOB: 1956/06/01 DOA: 12/20/2021 PCP: Donald Prose, MD    Brief Narrative:   Katrina Donaldson is a 66 y.o. female with past medical history significant for history of cecal mass s/p laparoscopic partial colectomy 2022, hypothyroidism, brain aneurysm, HTN, history of CVA, history of sacral osteomyelitis, wheelchair/bedbound at baseline who presented from [facility] on 6/22 with an abdominal pain and associated nausea/vomiting and poor appetite that progressed over the past 5 days.  Also no bowel movements reported over the last 5 days as well.  The, patient was tachycardic, otherwise stable.  Patient with mild anemia and CT abdomen/pelvis with high-grade small bowel obstruction likely secondary to adhesions and large hiatal hernia.  General surgery was consulted and patient was admitted under hospital service for further evaluation and treatment.  Assessment & Plan:   Small bowel obstruction Patient presenting from SNF with 5-day history of progressive abdominal pain associate with nausea/vomiting and no bowel movements.  CT abdomen/pelvis with high-grade small bowel obstruction likely secondary to adhesions and large hiatal hernia.  General surgery was consulted and followed during hospital course.  NG tube was initially placed to allow abdominal decompression but unfortunately patient did not progress appropriately.  Patient underwent exploratory laparotomy with lysis of adhesions by Dr. Barry Dienes on 12/28/2020.  Following surgery, patient with slow progression of return of bowel function and patient was placed on TPN for nutritional support.  Bowel function has slowly progressed and patient's diet was advanced with no further nausea and vomiting, but patient with poor oral intake.  TPN was slowly weaned off and discontinued on 01/08/2022. --General surgery continues to follow, appreciate assistance --Continue encourage increased oral intake,  supplementation; hopefully appetite will continue to improve now off of TPN  Cardiomyopathy with new acute diastolic systolic congestive heart failure, compensated TTE 12/26/2021 with LVEF 30-35%, LV moderately decreased function with global hypokinesis, grade 1 diastolic dysfunction.  Repeat limited TTE 01/07/2022 with improvement of LVEF to 40-45%, LV mildly decreased function, LV with regional wall motion normalities. --Cardiology following, appreciate assistance --Metoprolol tartrate 12.5 mg p.o. twice daily  Anemia of chronic medical disease/iron deficiency Anemia panel with iron 20, TIBC low at 148, ferritin high at 514.  8.3 on 01/08/2022, stable.  Large hiatal hernia --Continue PPI  Hyponatremia Hyperchloremia Resolved  Hypokalemia Repleted, resolved  Hyperkalemia: Resolved --BMP in a.m.  Hypophosphatemia: Resolved  GERD: Protonix 40 mg p.o. twice daily  Hyperlipidemia: Atorvastatin 5 mg p.o. daily  Acute recurrent urinary retention Required Foley catheter, now discontinued as of 12/31/2021  Hypothyroidism: Levothyroxine 125 mg p.o. daily  Acute renal failure Creatinine peaked at admission at 1.22 likely secondary to poor oral intake in the setting of small bowel obstruction.  Supported with IV fluid hydration and now normalized with last creatinine 0.52.  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 --Continue Xarelto  Stage III pressure ulcer sacrum, POA Pressure Injury 12/20/21 Sacrum Posterior Stage 3 -  Full thickness tissue loss. Subcutaneous fat may be visible but bone, tendon or muscle are NOT exposed. Red (Active)  12/20/21 1950  Location: Sacrum  Location Orientation: Posterior  Staging: Stage 3 -  Full thickness tissue loss. Subcutaneous fat may be visible but bone, tendon or muscle are NOT exposed.   Wound Description (Comments): Red  Present on Admission: Yes  -- Evaluated by wound  care, continue offloading, position changes, local wound care  Weakness/debility/deconditioning: Wheelchair/bedbound at baseline, from [facility] long-term care. --Anticipate discharge back to SNF next 1-2 days  DVT prophylaxis: rivaroxaban (XARELTO) tablet 20 mg Start: 01/04/22 1845 SCDs Start: 12/20/21 1811 rivaroxaban (XARELTO) tablet 20 mg    Code Status: Full Code Family Communication:   Disposition Plan:  Level of care: Telemetry Status is: Inpatient Remains inpatient appropriate because: Weaning from TPN today, anticipate able to discharge back to long-term care at [facility] tomorrow versus Thursday    Consultants:  General surgery Cardiology  Procedures:  Exploratory laparotomy with lysis of adhesions 6/30, Dr. Barry Dienes TTE 6/28 Limited TTE 7/10 Vascular duplex ultrasound bilateral lower extremities 6/28  Antimicrobials:  Preoperative cefazolin   Subjective: Patient seen examined bedside, resting comfortably.  No specific complaints this morning.  Remains on half rate TPN.  Discussed with general surgery this morning, both now discontinue as want to encourage increased oral intake as bowel function has returned.  Discussed with patient regarding need for increased oral intake.  No other specific questions or concerns at this time.  Denies headache, no dizziness, no chest pain, no palpitations, no shortness of breath, no abdominal pain, no fever/chills/night sweats, no nausea/vomiting/diarrhea.  No acute events overnight per nursing staff.  Objective: Vitals:   01/08/22 0030 01/08/22 0157 01/08/22 0603 01/08/22 0940  BP: 128/72 120/80 111/76 108/73  Pulse: (!) 109 99 (!) 106 (!) 101  Resp:  '18 18 17  '$ Temp:  98 F (36.7 C) 98.3 F (36.8 C) 98 F (36.7 C)  TempSrc:  Oral Oral Oral  SpO2:  95% 100% 99%  Weight:      Height:        Intake/Output Summary (Last 24 hours) at  01/08/2022 1230 Last data filed at 01/08/2022 1000 Gross per 24 hour  Intake 1245.81 ml  Output 850 ml  Net 395.81 ml   Filed Weights   12/23/21 0600 12/26/21 1230 12/28/21 0949  Weight: 80.4 kg 84.1 kg 84.1 kg    Examination:  Physical Exam: GEN: NAD, alert and oriented x 3, chronically ill in appearance HEENT: NCAT, PERRL, EOMI, sclera clear, MMM PULM: CTAB w/o wheezes/crackles, normal respiratory effort, on room air CV: Tachycardic, regular rhythm w/o M/G/R GI: abd soft, NTND, NABS, no R/G/M MSK: no peripheral edema, moves all extremities independently NEURO: CN II-XII intact, no focal deficits PSYCH: normal mood/affect Integumentary: Stage III sacral wound noted    Data Reviewed: I have personally reviewed following labs and imaging studies  CBC: Recent Labs  Lab 01/04/22 0541 01/05/22 0622 01/06/22 0306 01/07/22 0305 01/08/22 0426  WBC 11.8* 11.6* 10.4 10.7* 8.8  NEUTROABS 9.7* 9.4* 8.2* 8.3* 6.7  HGB 8.4* 7.8* 7.9* 8.0* 8.3*  HCT 24.6* 22.1* 22.8* 23.0* 23.7*  MCV 75.5* 75.4* 74.8* 75.4* 76.0*  PLT 311 318 316 324 379   Basic Metabolic Panel: Recent Labs  Lab 01/02/22 0458 01/02/22 1219 01/03/22 0452 01/04/22 0541 01/06/22 0306 01/07/22 0305  NA 132*  --  132* 135 139 137  K 5.7* 4.8 4.4 4.0 4.4 4.3  CL 98  --  98 102 109 107  CO2 24  --  '27 26 25 24  '$ GLUCOSE 104*  --  141* 119* 121* 114*  BUN 14  --  '14 15 13 12  '$ CREATININE 0.50  --  0.45 0.43* 0.44 0.52  CALCIUM 8.4*  --  8.2* 8.2* 8.1* 8.0*  MG 2.1  --  1.9 1.9 2.0 1.8  PHOS 4.8*  --  4.5 3.9 3.6 3.9   GFR: Estimated Creatinine Clearance: 73.5 mL/min (by C-G formula based on SCr of 0.52 mg/dL). Liver Function Tests: Recent Labs  Lab 01/03/22 0452 01/04/22 0541 01/07/22 0305  AST 45* 56* 34  ALT 39 50* 35  ALKPHOS 156* 179* 162*  BILITOT 0.6 0.7 0.5  PROT 7.3 7.4 7.1  ALBUMIN 2.2* 2.2* 2.1*   No results for input(s): "LIPASE", "AMYLASE" in the last 168 hours. No results for  input(s): "AMMONIA" in the last 168 hours. Coagulation Profile: No results for input(s): "INR", "PROTIME" in the last 168 hours. Cardiac Enzymes: No results for input(s): "CKTOTAL", "CKMB", "CKMBINDEX", "TROPONINI" in the last 168 hours. BNP (last 3 results) No results for input(s): "PROBNP" in the last 8760 hours. HbA1C: No results for input(s): "HGBA1C" in the last 72 hours. CBG: Recent Labs  Lab 01/06/22 2310 01/07/22 0729 01/07/22 1558 01/08/22 0017 01/08/22 0727  GLUCAP 119* 118* 101* 110* 110*   Lipid Profile: Recent Labs    01/07/22 0305  TRIG 120   Thyroid Function Tests: No results for input(s): "TSH", "T4TOTAL", "FREET4", "T3FREE", "THYROIDAB" in the last 72 hours. Anemia Panel: No results for input(s): "VITAMINB12", "FOLATE", "FERRITIN", "TIBC", "IRON", "RETICCTPCT" in the last 72 hours. Sepsis Labs: No results for input(s): "PROCALCITON", "LATICACIDVEN" in the last 168 hours.  No results found for this or any previous visit (from the past 240 hour(s)).       Radiology Studies: ECHOCARDIOGRAM LIMITED  Result Date: 01/07/2022    ECHOCARDIOGRAM LIMITED REPORT   Patient Name:   XOE HOE Date of Exam: 01/07/2022 Medical Rec #:  [id number]       Height:       66.0 in Accession #:    [id number]      Weight:       185.4 lb Date of Birth:  Jan 08, 1956       BSA:          1.936 m Patient Age:    33 years        BP:           115/73 mmHg Patient Gender: F               HR:           104 bpm. Exam Location:  Inpatient Procedure: 2D Echo, Color Doppler, Cardiac Doppler and Intracardiac            Opacification Agent Indications:    I50.40* Unspecified combined systolic (congestive) and diastolic                 (congestive) heart failure  History:        Patient has prior history of Echocardiogram examinations, most                 recent 12/26/2021. Cardiomyopathy, Abnormal ECG, Stroke,                 Signs/Symptoms:Altered Mental Status; Risk Factors:Hypertension.   Sonographer:    Roseanna Rainbow RDCS Referring Phys: [place]  Sonographer Comments: Technically difficult study due to poor echo windows. Image acquisition challenging due to patient body habitus. Difficult due to patient position. IMPRESSIONS  1. Left ventricular ejection fraction, by estimation, is 40 to 45%. The left ventricle has mildly decreased function. The left ventricle demonstrates regional wall motion abnormalities (see scoring diagram/findings for description).  2. Right ventricular systolic function is normal. The right ventricular size is normal.  3. The inferior vena cava is normal in size with greater than 50% respiratory variability, suggesting right atrial pressure of 3 mmHg. FINDINGS  Left Ventricle: Left ventricular ejection fraction, by estimation, is 40 to 45%. The left ventricle has mildly decreased function. The left ventricle demonstrates regional wall motion abnormalities. Definity contrast agent was given IV to delineate the left ventricular endocardial borders. The left ventricular internal cavity size was normal in size. There is no left ventricular hypertrophy.  LV Wall Scoring: The anterior septum, mid inferoseptal segment, and basal inferoseptal segment are akinetic. The inferior wall is hypokinetic. The entire anterior wall, entire lateral wall, and entire apex are normal. Right Ventricle: The right ventricular size is normal. Right ventricular systolic function is normal. Aorta: The aortic root is normal in size and structure. Venous: The inferior vena cava is normal in size with greater than 50% respiratory variability, suggesting right atrial pressure of 3 mmHg. LEFT VENTRICLE PLAX 2D LVIDd:         4.00 cm LVIDs:         3.30 cm LV PW:         0.80 cm LV IVS:        0.80 cm  LV Volumes (MOD) LV vol d, MOD A2C: 78.8 ml LV vol d, MOD A4C: 87.4 ml LV vol s, MOD A2C: 38.9 ml LV vol s, MOD A4C: 50.1 ml LV SV MOD A2C:     39.9 ml LV SV MOD A4C:     87.4 ml LV SV MOD BP:      38.9  ml LEFT ATRIUM         Index LA diam:    2.40 cm 1.24 cm/m   AORTA Ao Root diam: 3.00 cm Oswaldo Milian MD Electronically signed by Oswaldo Milian MD Signature Date/Time: 01/07/2022/11:49:45 AM    Final         Scheduled Meds:  acetaminophen  1,000 mg Oral Q6H   bisacodyl  10 mg Rectal Daily   Chlorhexidine Gluconate Cloth  6 each Topical Daily   feeding supplement  1 Container Oral TID BM   fluticasone  1 spray Each Nare BID   levothyroxine  125 mcg Oral Q0600   lip balm   Topical BID   melatonin  3 mg Oral QHS   methocarbamol  500 mg Oral Q6H   metoprolol tartrate  12.5 mg Oral BID   nutrition supplement (JUVEN)  1 packet Oral BID BM   pantoprazole  40 mg Oral BID   rivaroxaban  20 mg Oral Q supper   sodium chloride flush  10-40 mL Intracatheter Q12H   Continuous Infusions:  dextrose 5 % and 0.45% NaCl 10 mL/hr at 01/03/22 0332   TPN ADULT (ION) 40 mL/hr at 01/07/22 1735     LOS: 19 days    Time spent: 48 minutes spent on chart review, discussion with nursing staff, consultants, updating family and interview/physical exam; more than 50% of that time was spent in counseling and/or coordination of care.    Javarus Dorner J [place] DO [facility] Available via Epic secure chat 7am-7pm After these hours, please refer to coverage provider listed on [email or web address] 01/08/2022, 12:30 PM

## 2022-01-08 NOTE — TOC Progression Note (Signed)
Transition of Care [facility]) - Progression Note   Patient Details  Name: Katrina Donaldson MRN: [id number] Date of Birth: 1956/05/31  [facility] of [facility] CM/SW [place], LCSW Phone Number: 01/08/2022, 12:59 PM  Clinical Narrative: CSW notified patient is expected to discharge back to [place] in 1-2 days. CSW updated Lorenza Chick in admissions and is aware patient will come back under LTC rather than rehab. TOC to follow.  Expected Discharge Plan: [place] Barriers to Discharge: Continued Medical Work up  Expected Discharge Plan and Services Expected Discharge Plan: [place] In-house Referral: Clinical Social Work Post Acute Care Choice: [place] Living arrangements for the past 2 months: [place]             DME Arranged: N/A DME Agency: NA  Readmission Risk Interventions    01/08/2022   12:59 PM  Readmission Risk Prevention Plan  Transportation Screening Complete  HRI or [place] Complete  Social Work Consult for [place] Planning/Counseling Complete  Palliative Care Screening Not Applicable  Medication Review Press photographer) Complete

## 2022-01-08 NOTE — Progress Notes (Signed)
Progress Note  Patient Name: Katrina Donaldson Date of Encounter: 01/08/2022  [facility] Cardiologist: Skeet Latch, MD   Subjective   Possibly slowly improving - did well with echo yesterday, LVEF has improved to 40-45%. Remains tachycardic - did have some oral intake yesterday. Plan to continue TPN. No chest pain or dyspnea.   Inpatient Medications    Scheduled Meds:  acetaminophen  1,000 mg Oral Q6H   bisacodyl  10 mg Rectal Daily   Chlorhexidine Gluconate Cloth  6 each Topical Daily   feeding supplement  1 Container Oral TID BM   fluticasone  1 spray Each Nare BID   levothyroxine  125 mcg Oral Q0600   lip balm   Topical BID   melatonin  3 mg Oral QHS   methocarbamol  500 mg Oral Q6H   metoprolol tartrate  5 mg Intravenous Q6H   nutrition supplement (JUVEN)  1 packet Oral BID BM   pantoprazole  40 mg Oral BID   rivaroxaban  20 mg Oral Q supper   sodium chloride flush  10-40 mL Intracatheter Q12H   Continuous Infusions:  dextrose 5 % and 0.45% NaCl 10 mL/hr at 01/03/22 0332   TPN ADULT (ION) 40 mL/hr at 01/07/22 1735   PRN Meds: albuterol, alum & mag hydroxide-simeth, azelastine, diphenhydrAMINE, hydrALAZINE, hydrocortisone, lip balm, magic mouthwash, menthol-cetylpyridinium, metoprolol tartrate, morphine injection, ondansetron **OR** ondansetron (ZOFRAN) IV, oxyCODONE, phenol, polyvinyl alcohol, simethicone, sodium chloride, sodium chloride flush   Vital Signs    Vitals:   01/08/22 0030 01/08/22 0157 01/08/22 0603 01/08/22 0940  BP: 128/72 120/80 111/76 108/73  Pulse: (!) 109 99 (!) 106 (!) 101  Resp:  '18 18 17  '$ Temp:  98 F (36.7 C) 98.3 F (36.8 C) 98 F (36.7 C)  TempSrc:  Oral Oral Oral  SpO2:  95% 100% 99%  Weight:      Height:        Intake/Output Summary (Last 24 hours) at 01/08/2022 1058 Last data filed at 01/08/2022 1000 Gross per 24 hour  Intake 1245.81 ml  Output 850 ml  Net 395.81 ml      12/28/2021    9:49 AM 12/26/2021   12:30 PM  12/23/2021    6:00 AM  Last 3 Weights  Weight (lbs) 185 lb 6.5 oz 185 lb 6.5 oz 177 lb 4 oz  Weight (kg) 84.1 kg 84.1 kg 80.4 kg      Telemetry    Sinus rhythm/tachycardia, HR 90-120s - Personally Reviewed  ECG    N/A  Physical Exam   GEN: No acute distress.   Neck: No JVD Cardiac: RRR, no murmurs, rubs, or gallops.  Respiratory: Clear to auscultation bilaterally. GI: Soft, nontender, non-distended  MS: No edema; No deformity. Neuro:  Nonfocal  Psych: Normal affect   Labs    High Sensitivity Troponin:   Recent Labs  Lab 12/26/21 1658  TROPONINIHS 12     Chemistry Recent Labs  Lab 01/03/22 0452 01/04/22 0541 01/06/22 0306 01/07/22 0305  NA 132* 135 139 137  K 4.4 4.0 4.4 4.3  CL 98 102 109 107  CO2 '27 26 25 24  '$ GLUCOSE 141* 119* 121* 114*  BUN '14 15 13 12  '$ CREATININE 0.45 0.43* 0.44 0.52  CALCIUM 8.2* 8.2* 8.1* 8.0*  MG 1.9 1.9 2.0 1.8  PROT 7.3 7.4  --  7.1  ALBUMIN 2.2* 2.2*  --  2.1*  AST 45* 56*  --  34  ALT 39 50*  --  35  ALKPHOS 156* 179*  --  162*  BILITOT 0.6 0.7  --  0.5  GFRNONAA >60 >60 >60 >60  ANIONGAP '7 7 5 6    '$ Lipids  Recent Labs  Lab 01/07/22 0305  TRIG 120    Hematology Recent Labs  Lab 01/06/22 0306 01/07/22 0305 01/08/22 0426  WBC 10.4 10.7* 8.8  RBC 3.05* 3.05* 3.12*  HGB 7.9* 8.0* 8.3*  HCT 22.8* 23.0* 23.7*  MCV 74.8* 75.4* 76.0*  MCH 25.9* 26.2 26.6  MCHC 34.6 34.8 35.0  RDW 18.3* 18.6* 19.1*  PLT 316 324 356   Thyroid  No results for input(s): "TSH", "FREET4" in the last 168 hours.   BNPNo results for input(s): "BNP", "PROBNP" in the last 168 hours.  DDimer No results for input(s): "DDIMER" in the last 168 hours.   Radiology    ECHOCARDIOGRAM LIMITED  Result Date: 01/07/2022    ECHOCARDIOGRAM LIMITED REPORT   Patient Name:   Katrina Donaldson Date of Exam: 01/07/2022 Medical Rec #:  [id number]       Height:       66.0 in Accession #:    [id number]      Weight:       185.4 lb Date of Birth:  [birth year]        BSA:          1.936 m Patient Age:    66 years        BP:           115/73 mmHg Patient Gender: F               HR:           104 bpm. Exam Location:  Inpatient Procedure: 2D Echo, Color Doppler, Cardiac Doppler and Intracardiac            Opacification Agent Indications:    I50.40* Unspecified combined systolic (congestive) and diastolic                 (congestive) heart failure  History:        Patient has prior history of Echocardiogram examinations, most                 recent 12/26/2021. Cardiomyopathy, Abnormal ECG, Stroke,                 Signs/Symptoms:Altered Mental Status; Risk Factors:Hypertension.  Sonographer:    Roseanna Rainbow RDCS Referring Phys: [place]  Sonographer Comments: Technically difficult study due to poor echo windows. Image acquisition challenging due to patient body habitus. Difficult due to patient position. IMPRESSIONS  1. Left ventricular ejection fraction, by estimation, is 40 to 45%. The left ventricle has mildly decreased function. The left ventricle demonstrates regional wall motion abnormalities (see scoring diagram/findings for description).  2. Right ventricular systolic function is normal. The right ventricular size is normal.  3. The inferior vena cava is normal in size with greater than 50% respiratory variability, suggesting right atrial pressure of 3 mmHg. FINDINGS  Left Ventricle: Left ventricular ejection fraction, by estimation, is 40 to 45%. The left ventricle has mildly decreased function. The left ventricle demonstrates regional wall motion abnormalities. Definity contrast agent was given IV to delineate the left ventricular endocardial borders. The left ventricular internal cavity size was normal in size. There is no left ventricular hypertrophy.  LV Wall Scoring: The anterior septum, mid inferoseptal segment, and basal inferoseptal segment are akinetic. The inferior wall is hypokinetic. The entire anterior wall, entire  lateral wall, and entire apex are  normal. Right Ventricle: The right ventricular size is normal. Right ventricular systolic function is normal. Aorta: The aortic root is normal in size and structure. Venous: The inferior vena cava is normal in size with greater than 50% respiratory variability, suggesting right atrial pressure of 3 mmHg. LEFT VENTRICLE PLAX 2D LVIDd:         4.00 cm LVIDs:         3.30 cm LV PW:         0.80 cm LV IVS:        0.80 cm  LV Volumes (MOD) LV vol d, MOD A2C: 78.8 ml LV vol d, MOD A4C: 87.4 ml LV vol s, MOD A2C: 38.9 ml LV vol s, MOD A4C: 50.1 ml LV SV MOD A2C:     39.9 ml LV SV MOD A4C:     87.4 ml LV SV MOD BP:      38.9 ml LEFT ATRIUM         Index LA diam:    2.40 cm 1.24 cm/m   AORTA Ao Root diam: 3.00 cm Oswaldo Milian MD Electronically signed by Oswaldo Milian MD Signature Date/Time: 01/07/2022/11:49:45 AM    Final     Cardiac Studies   ECHO: 12/26/2021  1. Left ventricular ejection fraction, by estimation, is 30 to 35%. The  left ventricle has moderately decreased function. The left ventricle  demonstrates global hypokinesis. Left ventricular diastolic parameters are  consistent with Grade I diastolic dysfunction (impaired relaxation).   2. Right ventricular systolic function is normal. The right ventricular  size is normal.   3. The mitral valve is normal in structure. No evidence of mitral valve  regurgitation. No evidence of mitral stenosis.   4. The aortic valve is calcified. There is mild calcification of the  aortic valve. There is mild thickening of the aortic valve. Aortic valve  regurgitation is not visualized. Aortic valve sclerosis is present, with  no evidence of aortic valve stenosis.   5. The inferior vena cava is normal in size with greater than 50%  respiratory variability, suggesting right atrial pressure of 3 mmHg.    [place] LOWER EXTREMITY - DVT STUDY 12/26/2021 Comparison Study: 07/25/2016 - - Findings consistent with acute deep vein  thrombosis involving the right  common femoral vein, right profunda femoris vein, right femoral vein, right popliteal vein, right posterial  tibial vein, and right peroneal vein.   Patient Profile     66 y.o. 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 Cardiology is asked for evaluation of tachycardia and reviewed use LV function.  Assessment & Plan    1.  New systolic heart failure -Echocardiogram this admission with LV function of 30 to 35% and grade 1 diastolic dysfunction.  EF was normal in 2016.  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 Repeat limited echo yesterday shows improved LVEF to 40-45%. -Switch IV lopressor to metoprolol tartrate 12.5 mg BID - Monitor BP to see if it will allow addition of other heart failure medication  2.  Tachycardia -Symptoms multi factorial from underlying cardiomyopathy and metabolic condition as well as acute illness. - plan to start po metoprolol  3.  DVT - Back on Xarelto for chronic DVT  For questions or updates, please contact [place]  Please consult [email or web address] for contact info under   Pixie Casino, MD, FACC, [place] Director of the [facility]  [facility] Diplomate of the American Board of Clinical Lipidology Attending Cardiologist  Direct Dial: [phone]  Fax: [phone]  Website:  [email or web address]  Pixie Casino, MD  01/08/2022, 10:58 AM

## 2022-01-08 NOTE — Progress Notes (Signed)
Progress Note: General Surgery Service   Chief Complaint/Subjective: In better spirits today. Reports some RLE cramping pain/spasms, which has happened to her before. Ate some cottage cheese and peaches yesterday. States she enjoyed getting the echo - she felt relaxed and afterwards had more energy to spend time with her family. Objective: Vital signs in last 24 hours: Temp:  [97.8 F (36.6 C)-98.6 F (37 C)] 98.3 F (36.8 C) (07/11 0603) Pulse Rate:  [99-114] 106 (07/11 0603) Resp:  [18] 18 (07/11 0603) BP: (111-141)/(69-86) 111/76 (07/11 0603) SpO2:  [95 %-100 %] 100 % (07/11 0603) Last BM Date : 01/06/22  Intake/Output from previous day: 07/10 0701 - 07/11 0700 In: 1185.8 [P.O.:290; I.V.:895.8] Out: 850 [Urine:850] Intake/Output this shift: No intake/output data recorded.  GI: Abd Soft, incision c/d/I. The wound appears clean and without cellulitis.  Lab Results: CBC  Recent Labs    01/07/22 0305 01/08/22 0426  WBC 10.7* 8.8  HGB 8.0* 8.3*  HCT 23.0* 23.7*  PLT 324 356   BMET Recent Labs    01/06/22 0306 01/07/22 0305  NA 139 137  K 4.4 4.3  CL 109 107  CO2 25 24  GLUCOSE 121* 114*  BUN 13 12  CREATININE 0.44 0.52  CALCIUM 8.1* 8.0*   PT/INR No results for input(s): "LABPROT", "INR" in the last 72 hours. ABG No results for input(s): "PHART", "HCO3" in the last 72 hours.  Invalid input(s): "PCO2", "PO2"  Anti-infectives: Anti-infectives (From admission, onward)    Start     Dose/Rate Route Frequency Ordered Stop   12/28/21 1030  ceFAZolin (ANCEF) IVPB 2g/100 mL premix        2 g 200 mL/hr over 30 Minutes Intravenous  Once 12/28/21 1021 12/28/21 1156   12/28/21 1019  ceFAZolin (ANCEF) 2-4 GM/100ML-% IVPB       Note to Pharmacy: Christell Faith L: cabinet override      12/28/21 1019 12/28/21 1154       Medications: Scheduled Meds:  acetaminophen  1,000 mg Oral Q6H   bisacodyl  10 mg Rectal Daily   Chlorhexidine Gluconate Cloth  6 each Topical  Daily   feeding supplement  1 Container Oral TID BM   fluticasone  1 spray Each Nare BID   insulin aspart  0-9 Units Subcutaneous Q8H   levothyroxine  125 mcg Oral Q0600   lip balm   Topical BID   methocarbamol  500 mg Oral Q6H   metoprolol tartrate  5 mg Intravenous Q6H   nutrition supplement (JUVEN)  1 packet Oral BID BM   pantoprazole  40 mg Oral BID   rivaroxaban  20 mg Oral Q supper   sodium chloride flush  10-40 mL Intracatheter Q12H   Continuous Infusions:  dextrose 5 % and 0.45% NaCl 10 mL/hr at 01/03/22 0332   TPN ADULT (ION) 40 mL/hr at 01/07/22 1735   PRN Meds:.albuterol, alum & mag hydroxide-simeth, azelastine, diphenhydrAMINE, hydrALAZINE, hydrocortisone, lip balm, magic mouthwash, menthol-cetylpyridinium, metoprolol tartrate, morphine injection, ondansetron **OR** ondansetron (ZOFRAN) IV, oxyCODONE, phenol, polyvinyl alcohol, simethicone, sodium chloride, sodium chloride flush  Assessment/Plan: Katrina Donaldson is a 66 year old female who underwent exploratory laparotomy with lysis of adhesions that were causing an adhesive small bowel obstruction on 12/28/21.    - afebrile, VSS, WBC 8.8 -Bowel function returned, abdomen is nondistended, incision appears well-healing -Cont regular diet, and calorie count. Continue TPN for now appreciate dieticians assistance with PO supplements. - K pad for comfort/muscle pain  FEN: Regular, tpn ID:  none VTE: SCD's, Xeralto for hx DVT/hypercoagulable state Foley: removed 7/3, external cath in place Dispo: med- surg, [facility] service.   Hiatal hernia Cardiomyopathy, new acute systolic heart dysfunction - cards following  PMH DVT - acute vs chronic DVT visualized on RLE doppler 12/26/21    LOS: 19 days    Jill Alexanders, PA-C

## 2022-01-09 DIAGNOSIS — K56609 Unspecified intestinal obstruction, unspecified as to partial versus complete obstruction: Secondary | ICD-10-CM | POA: Diagnosis not present

## 2022-01-09 LAB — BASIC METABOLIC PANEL
Anion gap: 7 (ref 5–15)
BUN: 11 mg/dL (ref 8–23)
CO2: 24 mmol/L (ref 22–32)
Calcium: 7.9 mg/dL — ABNORMAL LOW (ref 8.9–10.3)
Chloride: 107 mmol/L (ref 98–111)
Creatinine, Ser: 0.51 mg/dL (ref 0.44–1.00)
GFR, Estimated: >60 mL/min (ref >60)
Glucose, Bld: 138 mg/dL — ABNORMAL HIGH (ref 70–99)
Potassium: 4.3 mmol/L (ref 3.5–5.1)
Sodium: 138 mmol/L (ref 135–145)

## 2022-01-09 LAB — CBC
HCT: 22.6 % — ABNORMAL LOW (ref 36.0–46.0)
Hemoglobin: 7.6 g/dL — ABNORMAL LOW (ref 12.0–15.0)
MCH: 25.9 pg — ABNORMAL LOW (ref 26.0–34.0)
MCHC: 33.6 g/dL (ref 30.0–36.0)
MCV: 76.9 fL — ABNORMAL LOW (ref 80.0–100.0)
Platelets: 343 10*3/uL (ref 150–400)
RBC: 2.94 MIL/uL — ABNORMAL LOW (ref 3.87–5.11)
RDW: 18.8 % — ABNORMAL HIGH (ref 11.5–15.5)
WBC: 7.7 10*3/uL (ref 4.0–10.5)
nRBC: 0.4 % — ABNORMAL HIGH (ref 0.0–0.2)

## 2022-01-09 LAB — MAGNESIUM: Magnesium: 1.9 mg/dL (ref 1.7–2.4)

## 2022-01-09 LAB — PHOSPHORUS: Phosphorus: 4.1 mg/dL (ref 2.5–4.6)

## 2022-01-09 MED ORDER — LOSARTAN POTASSIUM 25 MG PO TABS
12.5000 mg | ORAL_TABLET | Freq: Every day | ORAL | Status: DC
  Administered 2022-01-09: 12.5 mg via ORAL
  Filled 2022-01-09: qty 1

## 2022-01-09 MED ORDER — ADULT MULTIVITAMIN W/MINERALS CH
1.0000 | ORAL_TABLET | Freq: Every day | ORAL | Status: DC
  Administered 2022-01-09 – 2022-01-10 (×2): 1 via ORAL
  Filled 2022-01-09 (×2): qty 1

## 2022-01-09 NOTE — Progress Notes (Signed)
Nutrition Note   48 hour calorie count ordered. Day 2 below.  Diet: regular Supplements: Pt declines  7/11-7/12: Breakfast 7/12 nothing documented Lunch: 0% Dinner: 0% Supplements: none  Intervention:  -Continue daily snack (cottage cheese & peaches/pears) -Recommend resuming nutrition support, small bore NGT vs addressing goals -Multivitamin with minerals daily -Continue to encourage PO intakes  Clayton Bibles, MS, RD, LDN Inpatient Clinical Dietitian Contact information available via Amion

## 2022-01-09 NOTE — Progress Notes (Signed)
Patient continues to raise bed in highest position even though nursing staff has instructed her not to do so for her safety, she states "Im gonna keep raising it because I like to be high, it makes me feel like I'm close to the clouds."

## 2022-01-09 NOTE — Progress Notes (Signed)
Progress Note: General Surgery Service   Chief Complaint/Subjective: NAEO. No new complaints. Tolerating PO on calorie count Objective: Vital signs in last 24 hours: Temp:  [97.5 F (36.4 C)-97.9 F (36.6 C)] 97.5 F (36.4 C) (07/12 0921) Pulse Rate:  [94-116] 108 (07/12 0921) Resp:  [16-18] 18 (07/12 0921) BP: (107-121)/(69-81) 121/81 (07/12 0921) SpO2:  [99 %-100 %] 100 % (07/12 0921) Last BM Date : 01/06/22  Intake/Output from previous day: 07/11 0701 - 07/12 0700 In: 982 [P.O.:360; I.V.:621.6; IV Piggyback:0.4] Out: 400 [Urine:400] Intake/Output this shift: Total I/O In: 30 [P.O.:30] Out: 0   GI: Abd Soft, incision c/d/I. The wound appears clean and without cellulitis.  Lab Results: CBC  Recent Labs    01/08/22 0426 01/09/22 0354  WBC 8.8 7.7  HGB 8.3* 7.6*  HCT 23.7* 22.6*  PLT 356 343   BMET Recent Labs    01/07/22 0305 01/09/22 0354  NA 137 138  K 4.3 4.3  CL 107 107  CO2 24 24  GLUCOSE 114* 138*  BUN 12 11  CREATININE 0.52 0.51  CALCIUM 8.0* 7.9*   PT/INR No results for input(s): "LABPROT", "INR" in the last 72 hours. ABG No results for input(s): "PHART", "HCO3" in the last 72 hours.  Invalid input(s): "PCO2", "PO2"  Anti-infectives: Anti-infectives (From admission, onward)    Start     Dose/Rate Route Frequency Ordered Stop   12/28/21 1030  ceFAZolin (ANCEF) IVPB 2g/100 mL premix        2 g 200 mL/hr over 30 Minutes Intravenous  Once 12/28/21 1021 12/28/21 1156   12/28/21 1019  ceFAZolin (ANCEF) 2-4 GM/100ML-% IVPB       Note to Pharmacy: Christell Faith L: cabinet override      12/28/21 1019 12/28/21 1154       Medications: Scheduled Meds:  acetaminophen  1,000 mg Oral Q6H   atorvastatin  5 mg Oral QHS   bisacodyl  10 mg Rectal Daily   Chlorhexidine Gluconate Cloth  6 each Topical Daily   fluticasone  1 spray Each Nare BID   levothyroxine  125 mcg Oral Q0600   lip balm   Topical BID   losartan  12.5 mg Oral Daily   melatonin   3 mg Oral QHS   methocarbamol  500 mg Oral Q6H   metoprolol tartrate  12.5 mg Oral BID   pantoprazole  40 mg Oral BID   rivaroxaban  20 mg Oral Q supper   sodium chloride flush  10-40 mL Intracatheter Q12H   Continuous Infusions:  dextrose 5 % and 0.45% NaCl 10 mL/hr at 01/08/22 2233   PRN Meds:.albuterol, alum & mag hydroxide-simeth, azelastine, diphenhydrAMINE, hydrALAZINE, hydrocortisone, lip balm, magic mouthwash, menthol-cetylpyridinium, morphine injection, ondansetron **OR** ondansetron (ZOFRAN) IV, oxyCODONE, phenol, polyvinyl alcohol, simethicone, sodium chloride, sodium chloride flush  Assessment/Plan: Ms. Jentsch is a 66 year old female who underwent exploratory laparotomy with lysis of adhesions that were causing an adhesive small bowel obstruction on 12/28/21.    - afebrile, VSS -Bowel function returned, abdomen is nondistended, incision appears well-healing -Cont regular diet, and calorie count - K pad for comfort/muscle pain  FEN: Regular ID: none VTE: SCD's, Xeralto for hx DVT/hypercoagulable state Foley: removed 7/3, external cath in place Dispo: med- surg, Center service.   Hiatal hernia Cardiomyopathy, new acute systolic heart dysfunction - cards following  PMH DVT - acute vs chronic DVT visualized on RLE doppler 12/26/21    LOS: 20 days    Jill Alexanders, PA-C

## 2022-01-09 NOTE — Progress Notes (Signed)
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  No bowel movement over the last 5 days as well.  Patient was tachycardic.  Patient with mild anemia, CT abdomen and pelvis showed high-grade small bowel obstruction likely secondary to adhesions and large hiatal hernia.  General surgery was consulted and patient was admitted under hospitalist service.  Patient underwent exploratory laparotomy with lysis of adhesions  by Dr. Barry Dienes on 12/28/2020.  Following surgery patient with slow progression of bowel function and patient was placed on TPN.  Currently she has regained bowel function but has poor oral intake.    Assessment & Plan:   Principal Problem:   SBO (small bowel obstruction) (HCC) Active Problems:   Essential hypertension   Anemia, iron deficiency   Hypothyroidism   Hypercoagulable state ([place])   Hypokalemia   Confusion   Chronic anticoagulation   Cecum mass   Sacral osteomyelitis (HCC)   Pressure injury of sacral region, stage 4 (HCC)   AKI (acute kidney injury) ([place])   Pressure ulcer of sacral region, stage 3 ([place])   Cardiomyopathy ([place])   Bedridden   Incarcerated hiatal hernia  1-Small bowel obstruction: -Patient presented with abdominal pain and constipation.  CT abdomen and pelvis showed high-grade small bowel obstruction likely secondary to adhesion and large hiatal hernia. -She was treated initially conservatively, she failed to progress and underwent exploratory laparotomy with lysis of adhesions by Dr. Barry Dienes on  12/28/2020 -Following surgery, patient with a slow progression of return of bowel function and she was placed on TPN support.  Her bowel function has now recovered. -Currently awaiting for improved oral intake.  2-Cardiomyopathy  with new acute diastolic systolic congestive heart failure compensated TTE 12/26/2021 showed ejection fraction 30 to 35%, left ventricular moderate decreased function with global hypokinesis, grade 1 diastolic dysfunction.  Repeated limited echo TTE 7/10 showed improvement of left ventricular ejection fraction to 40 to 45%. Started on ACE and metoprolol Appreciate cardiology assistance  3-anemia of chronic disease,/iron deficiency: Iron 20, TIBC low at 148, ferritin high 514, Monitor hemoglobin  Large hiatal hernia: Continue with PPI GERD; Continue with PPI  Hypothyroidism: Continue with Synthroid Acute recurrent urinary retention: Required Foley catheter, now discontinue as of 12/31/2021 Renal failure: Creatinine peak at 1.2 the setting of poor oral intake.  Resolved with fluids  History of DVT: Vascular duplex ultrasound bilateral lower extremity 6/28 with findings consistent with chronic deep vein thrombosis right common femoral vein, no evidence of DVT left lower extremity. Hyponatremia/hyperchloremia: Resolved Hypokalemia: Resolved Hyperkalemia: Resolved Hypophosphatemia resolved See wound care documentation below.   Pressure Injury 12/20/21 Sacrum Posterior Stage 3 -  Full thickness tissue loss. Subcutaneous fat may be visible but bone, tendon or muscle are NOT exposed. Red (Active)  12/20/21 1950  Location: Sacrum  Location Orientation: Posterior  Staging: Stage 3 -  Full thickness tissue loss. Subcutaneous fat may be visible but bone, tendon or muscle are NOT exposed.  Wound Description (Comments): Red  Present on Admission: Yes  Dressing Type Foam - Lift dressing to assess site every shift;Gauze (Comment) 01/09/22 0730    Weakness/debility/deconditioning: Wheelchair/bedbound  at baseline, from [facility] long-term care. --Anticipate discharge back to SNF next 1-2 days, awaiting increase oral intake.   Nutrition Problem: Inadequate oral intake Etiology: acute illness, nausea, vomiting    Signs/Symptoms: NPO status    Interventions: TPN  Estimated body mass index is 29.93 kg/m as calculated from the following:   Height as of this encounter: '5\' 6"'$  (1.676 m).   Weight as of this encounter: 84.1 kg.   DVT prophylaxis: Xarelto Code Status: Full code Family Communication: Care discussed with patient  Disposition Plan:  Status is: Inpatient Remains inpatient appropriate because: awaiting improvement of oral intake.     Consultants:  Cardiology Surgery   Procedures:  Exploratory laparotomy with lysis of adhesions 6/30, Dr. Barry Dienes TTE 6/28 Limited TTE 7/10 Vascular duplex ultrasound bilateral lower extremities 6/28    Antimicrobials:  Preoperative cefazolin  Subjective: She did not eat breakfast, does not like fluid from the hospital.  She is planning on ordering lunch from outside restaurant  Objective: Vitals:   01/08/22 2338 01/09/22 0139 01/09/22 0921 01/09/22 1332  BP: 112/76 107/69 121/81 98/63  Pulse: (!) 109 94 (!) 108 95  Resp: '16 18 18 18  '$ Temp: 97.9 F (36.6 C) 97.9 F (36.6 C) (!) 97.5 F (36.4 C) 98 F (36.7 C)  TempSrc: Oral     SpO2: 99% 99% 100% 100%  Weight:      Height:        Intake/Output Summary (Last 24 hours) at 01/09/2022 1432 Last data filed at 01/09/2022 1400 Gross per 24 hour  Intake 892.02 ml  Output 400 ml  Net 492.02 ml   Filed Weights   12/23/21 0600 12/26/21 1230 12/28/21 0949  Weight: 80.4 kg 84.1 kg 84.1 kg    Examination:  General exam: Appears calm and comfortable  Respiratory system: Clear to auscultation. Respiratory effort normal. Cardiovascular system: S1 & S2 heard, RRR. No JVD, murmurs, rubs, gallops or clicks. No pedal  edema. Gastrointestinal system: Abdomen is soft, staples and dressing in place.  Central nervous system: Alert and oriented.  Extremities: Symmetric 5 x 5 power.    Data Reviewed: I have personally reviewed following labs and imaging studies  CBC: Recent Labs  Lab 01/04/22 0541 01/05/22 0622 01/06/22 0306 01/07/22 0305 01/08/22 0426 01/09/22 0354  WBC 11.8* 11.6* 10.4 10.7* 8.8 7.7  NEUTROABS 9.7* 9.4* 8.2* 8.3* 6.7  --   HGB 8.4* 7.8* 7.9* 8.0* 8.3* 7.6*  HCT 24.6* 22.1* 22.8* 23.0* 23.7* 22.6*  MCV 75.5* 75.4* 74.8* 75.4* 76.0* 76.9*  PLT 311 318 316 324 356 824   Basic Metabolic Panel: Recent Labs  Lab 01/03/22 0452 01/04/22 0541 01/06/22 0306 01/07/22 0305 01/09/22 0354  NA 132* 135 139 137 138  K 4.4 4.0 4.4 4.3 4.3  CL 98 102 109 107 107  CO2 '27 26 25 24 24  '$ GLUCOSE 141* 119* 121* 114* 138*  BUN '14 15 13 12 11  '$ CREATININE 0.45 0.43* 0.44 0.52 0.51  CALCIUM 8.2* 8.2* 8.1* 8.0* 7.9*  MG 1.9 1.9 2.0 1.8 1.9  PHOS 4.5 3.9 3.6 3.9 4.1   GFR: Estimated Creatinine Clearance: 73.5 mL/min (by C-G formula based on SCr of 0.51 mg/dL). Liver Function Tests: Recent Labs  Lab 01/03/22 0452 01/04/22 0541 01/07/22 0305  AST 45* 56* 34  ALT 39 50* 35  ALKPHOS 156* 179* 162*  BILITOT 0.6 0.7 0.5  PROT 7.3 7.4 7.1  ALBUMIN 2.2* 2.2* 2.1*   No results for input(s): "LIPASE", "AMYLASE"  in the last 168 hours. No results for input(s): "AMMONIA" in the last 168 hours. Coagulation Profile: No results for input(s): "INR", "PROTIME" in the last 168 hours. Cardiac Enzymes: No results for input(s): "CKTOTAL", "CKMB", "CKMBINDEX", "TROPONINI" in the last 168 hours. BNP (last 3 results) No results for input(s): "PROBNP" in the last 8760 hours. HbA1C: No results for input(s): "HGBA1C" in the last 72 hours. CBG: Recent Labs  Lab 01/07/22 0729 01/07/22 1558 01/08/22 0017 01/08/22 0727 01/08/22 1538  GLUCAP 118* 101* 110* 110* 109*   Lipid Profile: Recent Labs     01/07/22 0305  TRIG 120   Thyroid Function Tests: No results for input(s): "TSH", "T4TOTAL", "FREET4", "T3FREE", "THYROIDAB" in the last 72 hours. Anemia Panel: No results for input(s): "VITAMINB12", "FOLATE", "FERRITIN", "TIBC", "IRON", "RETICCTPCT" in the last 72 hours. Sepsis Labs: No results for input(s): "PROCALCITON", "LATICACIDVEN" in the last 168 hours.  No results found for this or any previous visit (from the past 240 hour(s)).       Radiology Studies: No results found.      Scheduled Meds:  acetaminophen  1,000 mg Oral Q6H   atorvastatin  5 mg Oral QHS   bisacodyl  10 mg Rectal Daily   Chlorhexidine Gluconate Cloth  6 each Topical Daily   fluticasone  1 spray Each Nare BID   levothyroxine  125 mcg Oral Q0600   lip balm   Topical BID   losartan  12.5 mg Oral Daily   melatonin  3 mg Oral QHS   methocarbamol  500 mg Oral Q6H   metoprolol tartrate  12.5 mg Oral BID   multivitamin with minerals  1 tablet Oral Daily   pantoprazole  40 mg Oral BID   rivaroxaban  20 mg Oral Q supper   sodium chloride flush  10-40 mL Intracatheter Q12H   Continuous Infusions:  dextrose 5 % and 0.45% NaCl 10 mL/hr at 01/08/22 2233     LOS: 20 days    Time spent: 35 minutes,    Katrina Lafont A Jakayden Cancio, MD [facility]   If 7PM-7AM, please contact night-coverage [email or web address]  01/09/2022, 2:32 PM

## 2022-01-09 NOTE — Progress Notes (Signed)
Progress Note  Patient Name: Katrina Donaldson Date of Encounter: 01/09/2022  [facility] Cardiologist: Skeet Latch, MD   Subjective   BP improved - now 893-810 systolic. Tolerating low dose beta blocker.   Inpatient Medications    Scheduled Meds:  acetaminophen  1,000 mg Oral Q6H   atorvastatin  5 mg Oral QHS   bisacodyl  10 mg Rectal Daily   Chlorhexidine Gluconate Cloth  6 each Topical Daily   fluticasone  1 spray Each Nare BID   levothyroxine  125 mcg Oral Q0600   lip balm   Topical BID   melatonin  3 mg Oral QHS   methocarbamol  500 mg Oral Q6H   metoprolol tartrate  12.5 mg Oral BID   pantoprazole  40 mg Oral BID   rivaroxaban  20 mg Oral Q supper   sodium chloride flush  10-40 mL Intracatheter Q12H   Continuous Infusions:  dextrose 5 % and 0.45% NaCl 10 mL/hr at 01/08/22 2233   PRN Meds: albuterol, alum & mag hydroxide-simeth, azelastine, diphenhydrAMINE, hydrALAZINE, hydrocortisone, lip balm, magic mouthwash, menthol-cetylpyridinium, morphine injection, ondansetron **OR** ondansetron (ZOFRAN) IV, oxyCODONE, phenol, polyvinyl alcohol, simethicone, sodium chloride, sodium chloride flush   Vital Signs    Vitals:   01/08/22 1256 01/08/22 2249 01/08/22 2338 01/09/22 0139  BP: 114/79 112/76 112/76 107/69  Pulse: (!) 110 (!) 116 (!) 109 94  Resp: '17  16 18  '$ Temp: 97.7 F (36.5 C)  97.9 F (36.6 C) 97.9 F (36.6 C)  TempSrc:   Oral   SpO2: 100% 100% 99% 99%  Weight:      Height:        Intake/Output Summary (Last 24 hours) at 01/09/2022 0917 Last data filed at 01/09/2022 0700 Gross per 24 hour  Intake 982.02 ml  Output 400 ml  Net 582.02 ml      12/28/2021    9:49 AM 12/26/2021   12:30 PM 12/23/2021    6:00 AM  Last 3 Weights  Weight (lbs) 185 lb 6.5 oz 185 lb 6.5 oz 177 lb 4 oz  Weight (kg) 84.1 kg 84.1 kg 80.4 kg      Telemetry    Sinus rhythm/tachycardia, HR 90-120s - Personally Reviewed  ECG    N/A  Physical Exam   GEN: No acute  distress.   Neck: No JVD Cardiac: RRR, no murmurs, rubs, or gallops.  Respiratory: Clear to auscultation bilaterally. GI: Soft, nontender, non-distended  MS: No edema; No deformity. Neuro:  Nonfocal  Psych: Normal affect   Labs    High Sensitivity Troponin:   Recent Labs  Lab 12/26/21 1658  TROPONINIHS 12     Chemistry Recent Labs  Lab 01/03/22 0452 01/04/22 0541 01/06/22 0306 01/07/22 0305 01/09/22 0354  NA 132* 135 139 137 138  K 4.4 4.0 4.4 4.3 4.3  CL 98 102 109 107 107  CO2 '27 26 25 24 24  '$ GLUCOSE 141* 119* 121* 114* 138*  BUN '14 15 13 12 11  '$ CREATININE 0.45 0.43* 0.44 0.52 0.51  CALCIUM 8.2* 8.2* 8.1* 8.0* 7.9*  MG 1.9 1.9 2.0 1.8 1.9  PROT 7.3 7.4  --  7.1  --   ALBUMIN 2.2* 2.2*  --  2.1*  --   AST 45* 56*  --  34  --   ALT 39 50*  --  35  --   ALKPHOS 156* 179*  --  162*  --   BILITOT 0.6 0.7  --  0.5  --  GFRNONAA >60 >60 >60 >60 >60  ANIONGAP '7 7 5 6 7    '$ Lipids  Recent Labs  Lab 01/07/22 0305  TRIG 120    Hematology Recent Labs  Lab 01/07/22 0305 01/08/22 0426 01/09/22 0354  WBC 10.7* 8.8 7.7  RBC 3.05* 3.12* 2.94*  HGB 8.0* 8.3* 7.6*  HCT 23.0* 23.7* 22.6*  MCV 75.4* 76.0* 76.9*  MCH 26.2 26.6 25.9*  MCHC 34.8 35.0 33.6  RDW 18.6* 19.1* 18.8*  PLT 324 356 343   Thyroid  No results for input(s): "TSH", "FREET4" in the last 168 hours.   BNPNo results for input(s): "BNP", "PROBNP" in the last 168 hours.  DDimer No results for input(s): "DDIMER" in the last 168 hours.   Radiology    ECHOCARDIOGRAM LIMITED  Result Date: 01/07/2022    ECHOCARDIOGRAM LIMITED REPORT   Patient Name:   Katrina Donaldson Date of Exam: 01/07/2022 Medical Rec #:  [id number]       Height:       66.0 in Accession #:    [id number]      Weight:       185.4 lb Date of Birth:  1955/09/28       BSA:          1.936 m Patient Age:    66 years        BP:           115/73 mmHg Patient Gender: F               HR:           104 bpm. Exam Location:  Inpatient Procedure: 2D  Echo, Color Doppler, Cardiac Doppler and Intracardiac            Opacification Agent Indications:    I50.40* Unspecified combined systolic (congestive) and diastolic                 (congestive) heart failure  History:        Patient has prior history of Echocardiogram examinations, most                 recent 12/26/2021. Cardiomyopathy, Abnormal ECG, Stroke,                 Signs/Symptoms:Altered Mental Status; Risk Factors:Hypertension.  Sonographer:    Roseanna Rainbow RDCS Referring Phys: [place]  Sonographer Comments: Technically difficult study due to poor echo windows. Image acquisition challenging due to patient body habitus. Difficult due to patient position. IMPRESSIONS  1. Left ventricular ejection fraction, by estimation, is 40 to 45%. The left ventricle has mildly decreased function. The left ventricle demonstrates regional wall motion abnormalities (see scoring diagram/findings for description).  2. Right ventricular systolic function is normal. The right ventricular size is normal.  3. The inferior vena cava is normal in size with greater than 50% respiratory variability, suggesting right atrial pressure of 3 mmHg. FINDINGS  Left Ventricle: Left ventricular ejection fraction, by estimation, is 40 to 45%. The left ventricle has mildly decreased function. The left ventricle demonstrates regional wall motion abnormalities. Definity contrast agent was given IV to delineate the left ventricular endocardial borders. The left ventricular internal cavity size was normal in size. There is no left ventricular hypertrophy.  LV Wall Scoring: The anterior septum, mid inferoseptal segment, and basal inferoseptal segment are akinetic. The inferior wall is hypokinetic. The entire anterior wall, entire lateral wall, and entire apex are normal. Right Ventricle: The right ventricular size is normal. Right  ventricular systolic function is normal. Aorta: The aortic root is normal in size and structure. Venous: The  inferior vena cava is normal in size with greater than 50% respiratory variability, suggesting right atrial pressure of 3 mmHg. LEFT VENTRICLE PLAX 2D LVIDd:         4.00 cm LVIDs:         3.30 cm LV PW:         0.80 cm LV IVS:        0.80 cm  LV Volumes (MOD) LV vol d, MOD A2C: 78.8 ml LV vol d, MOD A4C: 87.4 ml LV vol s, MOD A2C: 38.9 ml LV vol s, MOD A4C: 50.1 ml LV SV MOD A2C:     39.9 ml LV SV MOD A4C:     87.4 ml LV SV MOD BP:      38.9 ml LEFT ATRIUM         Index LA diam:    2.40 cm 1.24 cm/m   AORTA Ao Root diam: 3.00 cm Oswaldo Milian MD Electronically signed by Oswaldo Milian MD Signature Date/Time: 01/07/2022/11:49:45 AM    Final     Cardiac Studies   ECHO: 12/26/2021  1. Left ventricular ejection fraction, by estimation, is 30 to 35%. The  left ventricle has moderately decreased function. The left ventricle  demonstrates global hypokinesis. Left ventricular diastolic parameters are  consistent with Grade I diastolic dysfunction (impaired relaxation).   2. Right ventricular systolic function is normal. The right ventricular  size is normal.   3. The mitral valve is normal in structure. No evidence of mitral valve  regurgitation. No evidence of mitral stenosis.   4. The aortic valve is calcified. There is mild calcification of the  aortic valve. There is mild thickening of the aortic valve. Aortic valve  regurgitation is not visualized. Aortic valve sclerosis is present, with  no evidence of aortic valve stenosis.   5. The inferior vena cava is normal in size with greater than 50%  respiratory variability, suggesting right atrial pressure of 3 mmHg.    [place] LOWER EXTREMITY - DVT STUDY 12/26/2021 Comparison Study: 07/25/2016 - - Findings consistent with acute deep vein  thrombosis involving the right common femoral vein, right profunda femoris vein, right femoral vein, right popliteal vein, right posterial  tibial vein, and right peroneal vein.   Patient Profile     66  y.o. 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 Cardiology is asked for evaluation of tachycardia and reviewed use LV function.  Assessment & Plan    1.  New systolic heart failure -Echocardiogram this admission with LV function of 30 to 35% and grade 1 diastolic dysfunction.  EF was normal in 2016.  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 Repeat limited echo yesterday shows improved LVEF to 40-45%. -Continue metoprolol tartrate 12.5 mg BID - Add low dose losartan 12.5 mg daily  2.  Tachycardia -Symptoms multi factorial from underlying cardiomyopathy and metabolic condition as well as acute illness. - plan to start po metoprolol  3.  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

## 2022-01-09 NOTE — Progress Notes (Signed)
    Vitals monitored - she has been more hypotensive today. It does not appear she will tolerate low dose losartan. I have discontinued it. Continue BID metoprolol.  Pixie Casino, MD, [facility], [place] Director of the Advanced Lipid Disorders &  Cardiovascular Risk Reduction Clinic Diplomate of the American Board of Clinical Lipidology Attending Cardiologist  Direct Dial: [phone]  Fax: [phone]  Website:  [email or web address]

## 2022-01-10 DIAGNOSIS — I441 Atrioventricular block, second degree: Secondary | ICD-10-CM

## 2022-01-10 DIAGNOSIS — K56609 Unspecified intestinal obstruction, unspecified as to partial versus complete obstruction: Secondary | ICD-10-CM | POA: Diagnosis not present

## 2022-01-10 LAB — BASIC METABOLIC PANEL
Anion gap: 7 (ref 5–15)
BUN: 11 mg/dL (ref 8–23)
CO2: 23 mmol/L (ref 22–32)
Calcium: 7.7 mg/dL — ABNORMAL LOW (ref 8.9–10.3)
Chloride: 104 mmol/L (ref 98–111)
Creatinine, Ser: 0.45 mg/dL (ref 0.44–1.00)
GFR, Estimated: >60 mL/min (ref >60)
Glucose, Bld: 74 mg/dL (ref 70–99)
Potassium: 4.1 mmol/L (ref 3.5–5.1)
Sodium: 134 mmol/L — ABNORMAL LOW (ref 135–145)

## 2022-01-10 LAB — CBC
HCT: 24.4 % — ABNORMAL LOW (ref 36.0–46.0)
Hemoglobin: 8.2 g/dL — ABNORMAL LOW (ref 12.0–15.0)
MCH: 26.3 pg (ref 26.0–34.0)
MCHC: 33.6 g/dL (ref 30.0–36.0)
MCV: 78.2 fL — ABNORMAL LOW (ref 80.0–100.0)
Platelets: 390 10*3/uL (ref 150–400)
RBC: 3.12 MIL/uL — ABNORMAL LOW (ref 3.87–5.11)
RDW: 19.4 % — ABNORMAL HIGH (ref 11.5–15.5)
WBC: 8 10*3/uL (ref 4.0–10.5)
nRBC: 0.3 % — ABNORMAL HIGH (ref 0.0–0.2)

## 2022-01-10 MED ORDER — ACETAMINOPHEN 500 MG PO TABS: 1000.0000 mg | ORAL_TABLET | Freq: Three times a day (TID) | ORAL | 0 refills | Status: AC

## 2022-01-10 MED ORDER — OXYCODONE HCL 5 MG PO TABS: 5.0000 mg | ORAL_TABLET | ORAL | 0 refills | Status: DC | PRN

## 2022-01-10 MED ORDER — METHOCARBAMOL 500 MG PO TABS: 500.0000 mg | ORAL_TABLET | Freq: Four times a day (QID) | ORAL | 0 refills | Status: DC | PRN

## 2022-01-10 MED ORDER — MELATONIN 3 MG PO TABS: 3.0000 mg | ORAL_TABLET | Freq: Every day | ORAL | 0 refills | Status: AC

## 2022-01-10 MED ORDER — OXYCODONE HCL 5 MG PO TABS
5.0000 mg | ORAL_TABLET | ORAL | 0 refills | Status: AC | PRN
Start: 2022-01-10 — End: ?

## 2022-01-10 MED ORDER — ACETAMINOPHEN 500 MG PO TABS: 1000.0000 mg | ORAL_TABLET | Freq: Three times a day (TID) | ORAL | 0 refills | Status: DC

## 2022-01-10 MED ORDER — METHOCARBAMOL 500 MG PO TABS
500.0000 mg | ORAL_TABLET | Freq: Four times a day (QID) | ORAL | 0 refills | Status: AC | PRN
Start: 2022-01-10 — End: ?

## 2022-01-10 MED ORDER — MELATONIN 3 MG PO TABS: 3.0000 mg | ORAL_TABLET | Freq: Every day | ORAL | 0 refills | Status: DC

## 2022-01-10 NOTE — Progress Notes (Signed)
Patient being transferred to [facility], report given to receiving RN

## 2022-01-10 NOTE — Progress Notes (Signed)
Progress Note: General Surgery Service   Chief Complaint/Subjective: No new complaints, tolerating PO but not eating enough.  Objective: Vital signs in last 24 hours: Temp:  [98 F (36.7 C)-99 F (37.2 C)] 98.6 F (37 C) (07/13 0639) Pulse Rate:  [95-109] 109 (07/13 0639) Resp:  [16-18] 18 (07/13 0639) BP: (98-125)/(63-79) 125/73 (07/13 0639) SpO2:  [90 %-100 %] 99 % (07/13 0639) Last BM Date : 01/06/22  Intake/Output from previous day: 07/12 0701 - 07/13 0700 In: 150 [P.O.:150] Out: 750 [Urine:750] Intake/Output this shift: No intake/output data recorded.  GI: Abd Soft, incision c/d/I. The wound appears clean and without cellulitis.  Lab Results: CBC  Recent Labs    01/09/22 0354 01/10/22 0930  WBC 7.7 8.0  HGB 7.6* 8.2*  HCT 22.6* 24.4*  PLT 343 390   BMET Recent Labs    01/09/22 0354 01/10/22 0347  NA 138 134*  K 4.3 4.1  CL 107 104  CO2 24 23  GLUCOSE 138* 74  BUN 11 11  CREATININE 0.51 0.45  CALCIUM 7.9* 7.7*   PT/INR No results for input(s): "LABPROT", "INR" in the last 72 hours. ABG No results for input(s): "PHART", "HCO3" in the last 72 hours.  Invalid input(s): "PCO2", "PO2"  Anti-infectives: Anti-infectives (From admission, onward)    Start     Dose/Rate Route Frequency Ordered Stop   12/28/21 1030  ceFAZolin (ANCEF) IVPB 2g/100 mL premix        2 g 200 mL/hr over 30 Minutes Intravenous  Once 12/28/21 1021 12/28/21 1156   12/28/21 1019  ceFAZolin (ANCEF) 2-4 GM/100ML-% IVPB       Note to Pharmacy: Christell Faith L: cabinet override      12/28/21 1019 12/28/21 1154       Medications: Scheduled Meds:  acetaminophen  1,000 mg Oral Q6H   atorvastatin  5 mg Oral QHS   bisacodyl  10 mg Rectal Daily   Chlorhexidine Gluconate Cloth  6 each Topical Daily   fluticasone  1 spray Each Nare BID   levothyroxine  125 mcg Oral Q0600   lip balm   Topical BID   melatonin  3 mg Oral QHS   methocarbamol  500 mg Oral Q6H   metoprolol tartrate   12.5 mg Oral BID   multivitamin with minerals  1 tablet Oral Daily   pantoprazole  40 mg Oral BID   rivaroxaban  20 mg Oral Q supper   sodium chloride flush  10-40 mL Intracatheter Q12H   Continuous Infusions:  dextrose 5 % and 0.45% NaCl 10 mL/hr at 01/08/22 2233   PRN Meds:.albuterol, alum & mag hydroxide-simeth, azelastine, diphenhydrAMINE, hydrALAZINE, hydrocortisone, lip balm, magic mouthwash, menthol-cetylpyridinium, morphine injection, ondansetron **OR** ondansetron (ZOFRAN) IV, oxyCODONE, phenol, polyvinyl alcohol, simethicone, sodium chloride, sodium chloride flush  Assessment/Plan: Katrina Donaldson is a 66 year old female who underwent exploratory laparotomy with lysis of adhesions that were causing an adhesive small bowel obstruction on 12/28/21.    - afebrile, VSS -Bowel function returned, abdomen is nondistended, incision appears well-healing -Cont regular diet - recovering appropriately from above operation. D/C staple today. Diet and dispo/placement per primary team. CCS will sign off. Call as needed. I will arrange follow up outpatient.  FEN: Regular ID: none VTE: SCD's, Xeralto for hx DVT/hypercoagulable state Foley: removed 7/3, external cath in place Dispo: med- surg, TRH service.   Hiatal hernia Cardiomyopathy, new acute systolic heart dysfunction - cards following  PMH DVT - acute vs chronic DVT visualized on RLE  doppler 12/26/21    LOS: 21 days    Jill Alexanders, [place]

## 2022-01-10 NOTE — Progress Notes (Signed)
PICC Removal Note: RUE PICC line removed from R upper arm per protocol. PICC catheter tip visualized and intact. Pressure dressing applied with transparent film. No redness, ecchymosis, edema, swelling, or drainage noted at site.Instructions provided on post PICC discharge care, including followup notification instructions.

## 2022-01-10 NOTE — Discharge Summary (Signed)
Physician Discharge Summary   Patient: Katrina Donaldson MRN: [id number] DOB: April 16, 1956  Admit date:     12/20/2021  Discharge date: 01/10/22  Discharge Physician: Elmarie Shiley   PCP: Donald Prose, MD   Recommendations at discharge:    Needs to follow up with cardiology for further adjustment of medication for cardiomyopathy.  Needs to follow up with surgery post op follow up  Discharge Diagnoses: Principal Problem:   SBO (small bowel obstruction) ([place]) Active Problems:   Essential hypertension   Anemia, iron deficiency   Hypothyroidism   Hypercoagulable state ([place])   Hypokalemia   Confusion   Chronic anticoagulation   Cecum mass   Sacral osteomyelitis (HCC)   Pressure injury of sacral region, stage 4 (HCC)   AKI (acute kidney injury) ([place])   Pressure ulcer of sacral region, stage 3 ([place])   Cardiomyopathy ([place])   Bedridden   Incarcerated hiatal hernia  Resolved Problems:   * No resolved hospital problems. 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  No bowel movement over the last 5 days as well.  Patient was tachycardic.  Patient with mild anemia, CT abdomen and pelvis showed high-grade small bowel obstruction likely secondary to adhesions and large hiatal hernia.  General surgery was consulted and patient was admitted under hospitalist service.  Patient underwent exploratory laparotomy with lysis of adhesions  by Dr. Barry Dienes on 12/28/2020.  Following surgery patient with slow progression of bowel function and patient was placed on TPN.  Currently she has regained bowel function. She ate yesterday from outside food.   She has remain stable, surgery and cardiology has clear patient for discharge.    Assessment and Plan:  1-Small bowel obstruction: -Patient presented with abdominal pain and constipation.  CT abdomen and pelvis showed high-grade small bowel obstruction likely secondary to adhesion and large hiatal hernia. -She was treated initially conservatively, she failed to progress and underwent exploratory laparotomy with lysis of adhesions by Dr. Barry Dienes on 12/28/2020 -Following surgery, patient with a slow progression of return of bowel function and she was placed on TPN support.  Her bowel function has now recovered. -she was able to eat yesterday some take out food.  She is stable for discharge. She might eat more at her rehab.    2-Cardiomyopathy  with new acute diastolic systolic congestive heart failure compensated TTE 12/26/2021 showed ejection fraction 30 to 35%, left ventricular moderate decreased function with global hypokinesis, grade 1 diastolic dysfunction.  Repeated limited echo TTE 7/10 showed improvement of left ventricular ejection fraction to 40 to 45%. Started on  metoprolol. ACE discontinue due to soft BP.  Appreciate cardiology assistance/. Ok to discharge  today per cardiology.    3-anemia of chronic disease,/iron deficiency: Iron 20, TIBC low at 148, ferritin high 514, Monitor hemoglobin, stable.    Large hiatal hernia: Continue with PPI GERD; Continue with PPI  Hypothyroidism: Continue with Synthroid Acute recurrent urinary retention: Required Foley catheter, now discontinue as of 12/31/2021 Renal failure: Creatinine peak at 1.2 the setting of poor oral intake.  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 Continue with Xarelto.  Hyponatremia/hyperchloremia: Resolved Hypokalemia:  Resolved Hyperkalemia: Resolved Hypophosphatemia resolved See wound care documentation below.          Consultants: Surgery, cardiology Procedures performed: Exploratory laparotomy with lysis of adhesions 6/30, Dr. Barry Dienes TTE 6/28 Limited TTE 7/10 Vascular duplex ultrasound bilateral lower extremities 6/28     Disposition: Skilled nursing facility Diet recommendation:  Discharge Diet Orders (From admission, onward)     Start     Ordered   01/10/22 0000  Diet - low sodium heart healthy        01/10/22 1227           Cardiac diet DISCHARGE MEDICATION: Allergies as of 01/10/2022       Reactions   Shellfish-derived Products Other (See Comments)   ALLERGIC TO SHRIMP!! Made the tongue "feel strange" Per patient, she has since eaten crab legs and did not have another reaction.        Medication List     STOP taking these medications    baclofen 10 MG tablet Commonly known as: LIORESAL   Baclofen 5 MG Tabs   gabapentin 100 MG capsule Commonly known as: NEURONTIN   gabapentin 300 MG capsule Commonly known as: NEURONTIN   gabapentin 400 MG capsule Commonly known as: NEURONTIN   hydrOXYzine 25 MG tablet Commonly known as: ATARAX   saccharomyces boulardii 250 MG capsule Commonly known as: FLORASTOR   sennosides-docusate sodium 8.6-50 MG tablet Commonly known as: SENOKOT-S       TAKE these medications    acetaminophen 500 MG tablet Commonly known as: TYLENOL Take 2 tablets (1,000 mg total) by mouth 3 (three) times daily. What changed:  medication strength how much to take when to take this reasons to take this   antiseptic oral rinse Liqd 30 mLs by Mouth Rinse route daily as needed for dry mouth.   Artificial Tears 0.2-0.2-1 % Soln Generic drug: Glycerin-Hypromellose-PEG 400 Place 1 drop into both eyes every 2 (two) hours as needed (for dryness).   Artificial Tears 0.2-0.2-1 % Soln Generic drug: Glycerin-Hypromellose-PEG 400 Place 1 drop  into both eyes every 2 (two) hours as needed (for dryness/irritation).   atorvastatin 10 MG tablet Commonly known as: LIPITOR Take 5 mg by mouth at bedtime.   azelastine 0.1 % nasal spray Commonly known as: ASTELIN Place 2 sprays into both nostrils 2 (two) times daily as needed for rhinitis or allergies.   Biofreeze 4 % Gel Generic drug: Menthol (Topical Analgesic) Apply 1 application  topically See admin instructions. Apply to shoulders and legs 2 times a day   bisacodyl 5 MG EC tablet Commonly known as: DULCOLAX Take 2 tablets (10 mg total) by mouth daily as needed for moderate constipation. What changed: reasons to take this   feeding supplement Liqd Take 237 mLs by mouth 3 (three) times daily between meals.   ferrous gluconate 324 MG tablet Commonly known as: FERGON Take 324 mg by mouth every other day.   fluticasone 50 MCG/ACT nasal spray Commonly known as: FLONASE Place 1 spray into both nostrils 2 (two) times daily.   GenTeal Tears 0.1-0.3 % Soln Generic drug: Dextran 70-Hypromellose Place 2 drops into both eyes at bedtime.   hydrocortisone cream 1 % Apply 1 Application topically 2 (two) times daily as needed for itching.   levothyroxine 125 MCG tablet Commonly known as: SYNTHROID Take 125 mcg by mouth daily before breakfast.   melatonin 3 MG Tabs tablet Take  1 tablet (3 mg total) by mouth at bedtime.   methocarbamol 500 MG tablet Commonly known as: ROBAXIN Take 1 tablet (500 mg total) by mouth every 6 (six) hours as needed for muscle spasms.   metoprolol tartrate 25 MG tablet Commonly known as: LOPRESSOR Take 1 tablet (25 mg total) by mouth 2 (two) times daily.   multivitamin with minerals Tabs tablet Take 1 tablet by mouth daily. What changed: when to take this   oxyCODONE 5 MG immediate release tablet Commonly known as: Oxy IR/ROXICODONE Take 1 tablet (5 mg total) by mouth every 4 (four) hours as needed for moderate pain.   pantoprazole 40 MG  tablet Commonly known as: PROTONIX Take 1 tablet (40 mg total) by mouth 2 (two) times daily.   PARoxetine 10 MG tablet Commonly known as: PAXIL Take 10 mg by mouth in the morning.   polyethylene glycol powder 17 GM/SCOOP powder Commonly known as: GLYCOLAX/MIRALAX Take 17 g by mouth daily as needed for mild constipation (TO BE MIXED WITH 6 OUNCES OF WATER).   Pro-Stat AWC Liqd Take 30 mLs by mouth 3 (three) times daily.   rivaroxaban 20 MG Tabs tablet Commonly known as: XARELTO Take 1 tablet (20 mg total) by mouth daily with supper. What changed: when to take this   Simethicone 180 MG Caps Take 360 mg by mouth daily as needed (for gas).   sodium chloride 0.65 % Soln nasal spray Commonly known as: OCEAN Place 2 sprays into both nostrils 3 (three) times daily as needed for congestion.               Discharge Care Instructions  (From admission, onward)           Start     Ordered   01/10/22 0000  Discharge wound care:       Comments: See above   01/10/22 1227            Discharge Exam: Filed Weights   12/23/21 0600 12/26/21 1230 12/28/21 0949  Weight: 80.4 kg 84.1 kg 84.1 kg   Feeling ok, she was able to eat yesterday. Report some abdominal pain, received pain meds.  General; NAD Abdomen; suture in place   Condition at discharge: stable  The results of significant diagnostics from this hospitalization (including imaging, microbiology, ancillary and laboratory) are listed below for reference.   Imaging Studies: ECHOCARDIOGRAM LIMITED  Result Date: 01/07/2022    ECHOCARDIOGRAM LIMITED REPORT   Patient Name:   YEILIN ZWEBER Date of Exam: 01/07/2022 Medical Rec #:  [id number]       Height:       66.0 in Accession #:    [id number]      Weight:       185.4 lb Date of Birth:  [birth year]       BSA:          1.936 m Patient Age:    70 years        BP:           115/73 mmHg Patient Gender: F               HR:           104 bpm. Exam Location:  Inpatient  Procedure: 2D Echo, Color Doppler, Cardiac Doppler and Intracardiac            Opacification Agent Indications:    I50.40* Unspecified combined systolic (congestive) and diastolic                 (  congestive) heart failure  History:        Patient has prior history of Echocardiogram examinations, most                 recent 12/26/2021. Cardiomyopathy, Abnormal ECG, Stroke,                 Signs/Symptoms:Altered Mental Status; Risk Factors:Hypertension.  Sonographer:    Roseanna Rainbow RDCS Referring Phys: [place]  Sonographer Comments: Technically difficult study due to poor echo windows. Image acquisition challenging due to patient body habitus. Difficult due to patient position. IMPRESSIONS  1. Left ventricular ejection fraction, by estimation, is 40 to 45%. The left ventricle has mildly decreased function. The left ventricle demonstrates regional wall motion abnormalities (see scoring diagram/findings for description).  2. Right ventricular systolic function is normal. The right ventricular size is normal.  3. The inferior vena cava is normal in size with greater than 50% respiratory variability, suggesting right atrial pressure of 3 mmHg. FINDINGS  Left Ventricle: Left ventricular ejection fraction, by estimation, is 40 to 45%. The left ventricle has mildly decreased function. The left ventricle demonstrates regional wall motion abnormalities. Definity contrast agent was given IV to delineate the left ventricular endocardial borders. The left ventricular internal cavity size was normal in size. There is no left ventricular hypertrophy.  LV Wall Scoring: The anterior septum, mid inferoseptal segment, and basal inferoseptal segment are akinetic. The inferior wall is hypokinetic. The entire anterior wall, entire lateral wall, and entire apex are normal. Right Ventricle: The right ventricular size is normal. Right ventricular systolic function is normal. Aorta: The aortic root is normal in size and structure.  Venous: The inferior vena cava is normal in size with greater than 50% respiratory variability, suggesting right atrial pressure of 3 mmHg. LEFT VENTRICLE PLAX 2D LVIDd:         4.00 cm LVIDs:         3.30 cm LV PW:         0.80 cm LV IVS:        0.80 cm  LV Volumes (MOD) LV vol d, MOD A2C: 78.8 ml LV vol d, MOD A4C: 87.4 ml LV vol s, MOD A2C: 38.9 ml LV vol s, MOD A4C: 50.1 ml LV SV MOD A2C:     39.9 ml LV SV MOD A4C:     87.4 ml LV SV MOD BP:      38.9 ml LEFT ATRIUM         Index LA diam:    2.40 cm 1.24 cm/m   AORTA Ao Root diam: 3.00 cm Oswaldo Milian MD Electronically signed by Oswaldo Milian MD Signature Date/Time: 01/07/2022/11:49:45 AM    Final    DG CHEST PORT 1 VIEW  Result Date: 12/31/2021 CLINICAL DATA:  Leukocytosis EXAM: PORTABLE CHEST 1 VIEW COMPARISON:  12/21/2021 FINDINGS: Patient rotated to the right. Nasogastric tube terminates in the herniated stomach with side port likely just below the gastroesophageal junction. No pleural effusion or pneumothorax. Large hiatal hernia with abdominal organs positioned in the lower left hemithorax. No congestive failure. Volume loss in the left lung base. Clear right lung. Right PICC line terminates at the superior caval/atrial junction. IMPRESSION: Large hiatal hernia. Volume loss within the adjacent left lung base, similar. Nasogastric tube currently positioned within the herniated stomach. Cardiomegaly without congestive failure. Electronically Signed   By: Abigail Miyamoto M.D.   On: 12/31/2021 09:21   VAS [place] LOWER EXTREMITY VENOUS (DVT)  Result Date:  12/26/2021  Lower Venous DVT Study Patient Name:  LEVI CRASS  Date of Exam:   12/26/2021 Medical Rec #: [id number]        Accession #:    [id number] Date of Birth: August 25, 1955        Patient Gender: F Patient Age:   39 years Exam Location:  [facility] Procedure:      VAS [place] LOWER EXTREMITY VENOUS (DVT) Referring Phys: [place]  --------------------------------------------------------------------------------  Indications: Swelling.  Risk Factors: DVT. Limitations: Poor ultrasound/tissue interface and patient positioning, patient immobility, patient pain tolerance. Comparison Study: 07/25/2016 - - Findings consistent with acute deep vein                   thrombosis involving the                   right common femoral vein, right profunda femoris vein, right                   femoral vein, right popliteal vein, right posterial tibial                   vein,                   and right peroneal vein.                   - Findings consistent with acute superficial vein thrombosis                   involving the right small saphenous vein.                   - No evidence of deep vein thrombosis involving the left lower                   extremity.                   - No evidence of Baker&'s cyst on the right or left. Performing Technologist: Oliver Hum RVT  Examination Guidelines: A complete evaluation includes B-mode imaging, spectral Doppler, color Doppler, and power Doppler as needed of all accessible portions of each vessel. Bilateral testing is considered an integral part of a complete examination. Limited examinations for reoccurring indications may be performed as noted. The reflux portion of the exam is performed with the patient in reverse Trendelenburg.  +---------+---------------+---------+-----------+----------+--------------+ RIGHT    CompressibilityPhasicitySpontaneityPropertiesThrombus Aging +---------+---------------+---------+-----------+----------+--------------+ CFV      Partial        Yes      Yes                  Chronic        +---------+---------------+---------+-----------+----------+--------------+ SFJ      Full                                                        +---------+---------------+---------+-----------+----------+--------------+ FV Prox  Full                                                         +---------+---------------+---------+-----------+----------+--------------+ FV Mid  Yes      Yes                                 +---------+---------------+---------+-----------+----------+--------------+ FV DistalFull                                                        +---------+---------------+---------+-----------+----------+--------------+ PFV      Full                                                        +---------+---------------+---------+-----------+----------+--------------+ POP      Full           Yes      Yes                                 +---------+---------------+---------+-----------+----------+--------------+ PTV      Full                                                        +---------+---------------+---------+-----------+----------+--------------+ PERO     Full                                                        +---------+---------------+---------+-----------+----------+--------------+   +---------+---------------+---------+-----------+----------+-------------------+ LEFT     CompressibilityPhasicitySpontaneityPropertiesThrombus Aging      +---------+---------------+---------+-----------+----------+-------------------+ CFV      Full           Yes      Yes                                      +---------+---------------+---------+-----------+----------+-------------------+ SFJ      Full                                                             +---------+---------------+---------+-----------+----------+-------------------+ FV Prox  Full                                                             +---------+---------------+---------+-----------+----------+-------------------+ FV Mid   Full                                                             +---------+---------------+---------+-----------+----------+-------------------+  FV Distal               Yes      Yes                                       +---------+---------------+---------+-----------+----------+-------------------+ PFV      Full                                                             +---------+---------------+---------+-----------+----------+-------------------+ POP      Full           Yes      Yes                                      +---------+---------------+---------+-----------+----------+-------------------+ PTV      Full                                                             +---------+---------------+---------+-----------+----------+-------------------+ PERO                                                  Not well visualized +---------+---------------+---------+-----------+----------+-------------------+     Summary: RIGHT: - 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 - No cystic structure found in the popliteal fossa.  LEFT: - There is no evidence of deep vein thrombosis in the lower extremity. However, portions of this examination were limited- see technologist comments above.  - No cystic structure found in the popliteal fossa.  *See table(s) above for measurements and observations. Electronically signed by Orlie Pollen on 12/26/2021 at 5:31:11 PM.    Final    ECHOCARDIOGRAM COMPLETE  Result Date: 12/26/2021    ECHOCARDIOGRAM REPORT   Patient Name:   DHANA TOTTON Date of Exam: 12/26/2021 Medical Rec #:  [id number]       Height:       66.0 in Accession #:    [id number]      Weight:       177.2 lb Date of Birth:  [birth year]       BSA:          1.900 m Patient Age:    13 years        BP:           125/82 mmHg Patient Gender: F               HR:           120 bpm. Exam Location:  Inpatient Procedure: 2D Echo, Color Doppler and Cardiac Doppler Indications:    Abnormal ekg  History:        Patient has no prior history of Echocardiogram examinations.  Sonographer:    Joette Catching RCS Referring Phys: [id number] [place]  [place]  Sonographer Comments: Technically  challenging study due to limited acoustic windows. Supine scan. IMPRESSIONS  1. Left ventricular ejection fraction, by estimation, is 30 to 35%. The left ventricle has moderately decreased function. The left ventricle demonstrates global hypokinesis. Left ventricular diastolic parameters are consistent with Grade I diastolic dysfunction (impaired relaxation).  2. Right ventricular systolic function is normal. The right ventricular size is normal.  3. The mitral valve is normal in structure. No evidence of mitral valve regurgitation. No evidence of mitral stenosis.  4. The aortic valve is calcified. There is mild calcification of the aortic valve. There is mild thickening of the aortic valve. Aortic valve regurgitation is not visualized. Aortic valve sclerosis is present, with no evidence of aortic valve stenosis.  5. The inferior vena cava is normal in size with greater than 50% respiratory variability, suggesting right atrial pressure of 3 mmHg. FINDINGS  Left Ventricle: Left ventricular ejection fraction, by estimation, is 30 to 35%. The left ventricle has moderately decreased function. The left ventricle demonstrates global hypokinesis. The left ventricular internal cavity size was normal in size. There is no left ventricular hypertrophy. Abnormal (paradoxical) septal motion, consistent with left bundle branch block. Left ventricular diastolic parameters are consistent with Grade I diastolic dysfunction (impaired relaxation). Right Ventricle: The right ventricular size is normal. No increase in right ventricular wall thickness. Right ventricular systolic function is normal. Left Atrium: Left atrial size was normal in size. Right Atrium: Right atrial size was normal in size. Pericardium: There is no evidence of pericardial effusion. Mitral Valve: The mitral valve is normal in structure. No evidence of mitral valve regurgitation. No evidence of mitral valve stenosis. Tricuspid  Valve: The tricuspid valve is normal in structure. Tricuspid valve regurgitation is not demonstrated. No evidence of tricuspid stenosis. Aortic Valve: The aortic valve is calcified. There is mild calcification of the aortic valve. There is mild thickening of the aortic valve. Aortic valve regurgitation is not visualized. Aortic valve sclerosis is present, with no evidence of aortic valve stenosis. Aortic valve mean gradient measures 4.0 mmHg. Aortic valve peak gradient measures 6.2 mmHg. Aortic valve area, by VTI measures 2.26 cm. Pulmonic Valve: The pulmonic valve was normal in structure. Pulmonic valve regurgitation is not visualized. No evidence of pulmonic stenosis. Aorta: The aortic root is normal in size and structure. Venous: The inferior vena cava is normal in size with greater than 50% respiratory variability, suggesting right atrial pressure of 3 mmHg. IAS/Shunts: No atrial level shunt detected by color flow Doppler.  LEFT VENTRICLE PLAX 2D LVIDd:         3.80 cm   Diastology LVIDs:         3.30 cm   LV e' medial:    5.22 cm/s LV PW:         1.00 cm   LV E/e' medial:  10.0 LV IVS:        0.90 cm   LV e' lateral:   5.77 cm/s LVOT diam:     2.30 cm   LV E/e' lateral: 9.1 LV SV:         30 LV SV Index:   16 LVOT Area:     4.15 cm  RIGHT VENTRICLE             IVC RV S prime:     29.70 cm/s  IVC diam: 1.70 cm TAPSE (M-mode): 2.1 cm LEFT ATRIUM             Index LA diam:  2.10 cm 1.11 cm/m LA Vol (A2C):   20.0 ml 10.53 ml/m LA Vol (A4C):   57.6 ml 30.32 ml/m LA Biplane Vol: 35.8 ml 18.84 ml/m  AORTIC VALVE                    PULMONIC VALVE AV Area (Vmax):    2.08 cm     PV Vmax:          1.15 m/s AV Area (Vmean):   2.10 cm     PV Peak grad:     5.3 mmHg AV Area (VTI):     2.26 cm     PR End Diast Vel: 10.76 msec AV Vmax:           125.00 cm/s AV Vmean:          94.000 cm/s AV VTI:            0.134 m AV Peak Grad:      6.2 mmHg AV Mean Grad:      4.0 mmHg LVOT Vmax:         62.70 cm/s LVOT Vmean:         47.600 cm/s LVOT VTI:          0.073 m LVOT/AV VTI ratio: 0.54  AORTA Ao Root diam: 3.40 cm Ao Asc diam:  3.30 cm MITRAL VALVE               TRICUSPID VALVE MV Area (PHT): 5.84 cm    TR Peak grad:   25.0 mmHg MV Decel Time: 130 msec    TR Vmax:        250.00 cm/s MV E velocity: 52.30 cm/s MV A velocity: 78.40 cm/s  SHUNTS MV E/A ratio:  0.67        Systemic VTI:  0.07 m                            Systemic Diam: 2.30 cm Candee Furbish MD Electronically signed by Candee Furbish MD Signature Date/Time: 12/26/2021/5:05:24 PM    Final    [place] EKG SITE RITE  Result Date: 12/26/2021 If Site Rite image not attached, placement could not be confirmed due to current cardiac rhythm.  DG Abd Portable 1V  Result Date: 12/26/2021 CLINICAL DATA:  Small-bowel obstruction EXAM: PORTABLE ABDOMEN - 1 VIEW COMPARISON:  12/25/2021 FINDINGS: Persistent dilated loops of small bowel particularly in the left abdomen. Maximum measurable distension is decreased. Calcified uterine fibroid is again noted. IMPRESSION: Persistent small bowel obstruction. Maximum distension measures decreased. Electronically Signed   By: Macy Mis M.D.   On: 12/26/2021 08:24   DG Abd Portable 1V  Result Date: 12/25/2021 CLINICAL DATA:  Small bowel obstruction. EXAM: PORTABLE ABDOMEN - 1 VIEW COMPARISON:  Abdominal x-ray from yesterday. FINDINGS: Enteric tube incompletely visualized looped in the left chest, presumably within the large hiatal hernia as seen on CT. Multiple dilated loops of small bowel are unchanged. Calcified uterine fibroid again noted. No acute osseous abnormality. IMPRESSION: 1. Unchanged small bowel obstruction. Electronically Signed   By: Titus Dubin M.D.   On: 12/25/2021 10:31   DG Abd 1 View  Result Date: 12/24/2021 CLINICAL DATA:  None hour delayed film.  Small bowel obstruction. EXAM: ABDOMEN - 1 VIEW COMPARISON:  12/23/2021 FINDINGS: Continued small bowel dilatation compatible with small bowel obstruction. Likely not  significantly changed. No visible free air. Large calcified fibroid in the right side of the  pelvis. NG tube is not visualized. This likely projects off the superior aspect of the image as the upper abdomen is not included on this single image. IMPRESSION: Continued small bowel obstruction pattern, not significantly changed. Electronically Signed   By: Rolm Baptise M.D.   On: 12/24/2021 23:14   DG Abd Portable 1V  Result Date: 12/23/2021 CLINICAL DATA:  Nasogastric tube placement. Small-bowel obstruction. EXAM: PORTABLE ABDOMEN - 1 VIEW COMPARISON:  Prior today FINDINGS: A nasogastric tube is now seen which appears coiled within a hiatal hernia. Moderately dilated small bowel loops show mild decrease in dilatation since prior exam. IMPRESSION: Nasogastric tube appears coiled within a hiatal hernia. Mildly decreased dilatation of small bowel loops since prior exam. Electronically Signed   By: Marlaine Hind M.D.   On: 12/23/2021 16:27   DG Abd Portable 1V-Small Bowel Obstruction Protocol-24 hr delay  Result Date: 12/23/2021 CLINICAL DATA:  Small bowel obstruction protocol, 24 hour delayed image. EXAM: PORTABLE ABDOMEN - 1 VIEW COMPARISON:  Abdominal x-rays 12/22/2021. FINDINGS: Contrast material remains in small bowel loops in the left lower quadrant. No colonic contrast identified. Small bowel loops are air-filled and dilated measuring up to 6.7 cm, similar to the prior study. Calcified uterine fibroid again noted. IMPRESSION: 1. Contrast material remains in small bowel loops in the left lower quadrant. Dilated small bowel is unchanged. Findings are compatible with small-bowel obstruction. Electronically Signed   By: Ronney Asters M.D.   On: 12/23/2021 15:27   DG Abd Portable 1V-Small Bowel Obstruction Protocol-initial, 8 hr delay  Result Date: 12/22/2021 CLINICAL DATA:  8 hour small-bowel follow up EXAM: PORTABLE ABDOMEN - 1 VIEW COMPARISON:  Film from earlier in the same day. FINDINGS: Multiple loops of  dilated small bowel are again identified. Previously administered contrast lies predominately within the mid to distal small bowel. No colonic contrast is noted. 24 hour follow-up is recommended. Calcified uterine fibroid is seen. IMPRESSION: Administered contrast lies within the dilated small bowel. No colonic contrast is seen. 24 hour follow-up is recommended. Electronically Signed   By: Inez Catalina M.D.   On: 12/22/2021 22:11   DG Abd Portable 1V  Result Date: 12/22/2021 CLINICAL DATA:  Small-bowel obstruction EXAM: PORTABLE ABDOMEN - 1 VIEW COMPARISON:  12/20/2021 FINDINGS: NG tube in the stomach with the tip near the antrum. Dilated small bowel loops similar to the prior study compatible with small bowel obstruction. Colon decompressed. Calcified uterine fibroid in the right pelvis. IMPRESSION: NG tube in the gastric antrum.  Small bowel obstruction unchanged. Electronically Signed   By: Franchot Gallo M.D.   On: 12/22/2021 11:29   DG Chest Port 1 View  Result Date: 12/21/2021 CLINICAL DATA:  [id number] fever EXAM: PORTABLE CHEST 1 VIEW COMPARISON:  Chest x-ray from yesterday nausea correlation is made with a CT of the abdomen and pelvis from yesterday FINDINGS: There has been interval insertion of an NG tube with its distal end at the right upper-mid abdomen. There has been interval partial decompression of the dilated stomach at the left lower thorax. Large hiatal hernia containing the stomach extending up to the left mid thorax. Left basilar atelectasis. Cardiomegaly. Right lung remains clear. Moderately severe dilatation of the small bowel loops of small bowel obstruction. IMPRESSION: There has been interval insertion of an NG tube with its distal end of the right upper-mid abdomen. There has been interval partial decompression of the herniated stomach through the hiatal hernia extending up to the mid left hemithorax. Left basilar atelectasis. Findings  of high-grade small-bowel obstruction.  Electronically Signed   By: Frazier Richards M.D.   On: 12/21/2021 08:57   DG Abd Portable 1V-Small Bowel Protocol-Position Verification  Result Date: 12/20/2021 CLINICAL DATA:  NG tube placement EXAM: PORTABLE ABDOMEN - 1 VIEW COMPARISON:  CT 12/20/2021 FINDINGS: Elevated left diaphragm with large hiatal hernia and intrathoracic stomach. Esophageal tube tip is below the diaphragm and probably in the region of gastroduodenal junction. Multiple loops of dilated small bowel consistent with an obstruction IMPRESSION: 1. Esophageal tube tip likely over the gastroduodenal junction 2. Elevated left diaphragm with large hiatal hernia and intrathoracic stomach. Subsegmental atelectasis at the left base. 3. Multiple dilated loops of small bowel consistent with an obstruction Electronically Signed   By: Donavan Foil M.D.   On: 12/20/2021 23:24   CT ABDOMEN PELVIS W CONTRAST  Result Date: 12/20/2021 CLINICAL DATA:  Abdominal pain, acute, nonlocalized n/v, no BM x5 days, diffuse abd pain lower EXAM: CT ABDOMEN AND PELVIS WITH CONTRAST TECHNIQUE: Multidetector CT imaging of the abdomen and pelvis was performed using the standard protocol following bolus administration of intravenous contrast. RADIATION DOSE REDUCTION: This exam was performed according to the departmental dose-optimization program which includes automated exposure control, adjustment of the mA and/or kV according to patient size and/or use of iterative reconstruction technique. CONTRAST:  154m OMNIPAQUE IOHEXOL 300 MG/ML  SOLN COMPARISON:  CT 02/01/2021 FINDINGS: Lower chest: Bibasilar atelectasis. Unchanged elevated left hemidiaphragm. Dilated and fluid-filled visualized esophagus. Hepatobiliary: No focal liver abnormality is seen. Mild gallbladder distension. Pancreas: Unremarkable. No pancreatic ductal dilatation or surrounding inflammatory changes. Spleen: Normal in size without focal abnormality. Adrenals/Urinary Tract: Adrenal glands are  unremarkable. No hydronephrosis. There are nonobstructing left lower pole renal stones largest measuring up to 4 mm. The bladder is moderately distended. Stomach/Bowel: Large hiatal hernia with dilated partially intrathoracic stomach. Multiple dilated loops of small bowel with transition point in the right upper abdomen (series 2, image 38, series 6, image 29-30). There is mesenteric edema.Prior right hemicolectomy with ileocolic anastomosis. Vascular/Lymphatic: Scattered atherosclerosis. No AAA. No lymphadenopathy. Reproductive: There is a subserosal or pedunculated calcified uterine fibroid which now appears on the right, likely due to rightward mass effect on the uterus by ascites and previously described small bowel obstruction. Other: Small volume abdominopelvic ascites.  No free air. Musculoskeletal: No acute osseous abnormality. Unchanged anterolisthesis at L4-L5. Moderate bilateral hip osteoarthritis. 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 Chronic changes at the origin of the left rectus femoris. There is diffuse muscle atrophy. IMPRESSION: High-grade small-bowel obstruction with transition point in the right upper abdomen, likely due to adhesions. Large hiatal hernia with dilated partially intrathoracic stomach related to the SBO. Small volume abdominopelvic ascites. Additional chronic findings as described above. Electronically Signed   By: JMaurine SimmeringM.D.   On: 12/20/2021 15:27   DG Chest Portable 1 View  Result Date: 12/20/2021 CLINICAL DATA:  Abdominal pain and vomiting. Small-bowel obstruction. EXAM: PORTABLE CHEST 1 VIEW COMPARISON:  CT of the abdomen and pelvis 02/01/2021 FINDINGS: The heart is enlarged. Chronic elevation of left hemidiaphragm present. The stomach is within the hernia, dilated. Additional loops of bowel are present within the hernia. Moderate distension of small bowel noted in the abdomen. IMPRESSION: 1. Moderate distension of small bowel compatible with  small bowel obstruction or ileus. Dedicated imaging of the abdomen scratched at dedicated radiographs of the abdomen or CT of the abdomen pelvis would be useful for further evaluation. 2. Chronic elevation of left hemidiaphragm/hernia.  3. Cardiomegaly. Electronically Signed   By: San Morelle M.D.   On: 12/20/2021 13:03    Microbiology: Results for orders placed or performed during the hospital encounter of 12/20/21  Resp Panel by RT-PCR (Flu A&B, Covid) Anterior Nasal Swab     Status: None   Collection Time: 12/20/21 12:40 PM   Specimen: Anterior Nasal Swab  Result Value Ref Range Status   SARS Coronavirus 2 by RT PCR NEGATIVE NEGATIVE Final    Comment: (NOTE) SARS-CoV-2 target nucleic acids are NOT DETECTED.  The SARS-CoV-2 RNA is generally detectable in upper respiratory specimens during the acute phase of infection. The lowest concentration of SARS-CoV-2 viral copies this assay can detect is 138 copies/mL. A negative result does not preclude SARS-Cov-2 infection and should not be used as the sole basis for treatment or other patient management decisions. A negative result may occur with  improper specimen collection/handling, submission of specimen other than nasopharyngeal swab, presence of viral mutation(s) within the areas targeted by this assay, and inadequate number of viral copies(<138 copies/mL). A negative result must be combined with clinical observations, patient history, and epidemiological information. The expected result is Negative.  Fact Sheet for Patients:  EntrepreneurPulse.com.au  Fact Sheet for Healthcare Providers:  IncredibleEmployment.be  This test is no t yet approved or cleared by the [place] FDA and  has been authorized for detection and/or diagnosis of SARS-CoV-2 by FDA under an Emergency Use Authorization (EUA). This EUA will remain  in effect (meaning this test can be used) for the duration of  the COVID-19 declaration under Section 564(b)(1) of the Act, 21 U.S.C.section 360bbb-3(b)(1), unless the authorization is terminated  or revoked sooner.       Influenza A by PCR NEGATIVE NEGATIVE Final   Influenza B by PCR NEGATIVE NEGATIVE Final    Comment: (NOTE) The Xpert Xpress SARS-CoV-2/FLU/RSV plus assay is intended as an aid in the diagnosis of influenza from Nasopharyngeal swab specimens and should not be used as a sole basis for treatment. Nasal washings and aspirates are unacceptable for Xpert Xpress SARS-CoV-2/FLU/RSV testing.  Fact Sheet for Patients: EntrepreneurPulse.com.au  Fact Sheet for Healthcare Providers: IncredibleEmployment.be  This test is not yet approved or cleared by the [place] FDA and has been authorized for detection and/or diagnosis of SARS-CoV-2 by FDA under an Emergency Use Authorization (EUA). This EUA will remain in effect (meaning this test can be used) for the duration of the COVID-19 declaration under Section 564(b)(1) of the Act, 21 U.S.C. section 360bbb-3(b)(1), unless the authorization is terminated or revoked.  Performed at [facility], [place] [address] [place], [place] [address]   Culture, blood (Routine X 2) w Reflex to ID Panel     Status: None   Collection Time: 12/21/21  7:54 AM   Specimen: BLOOD  Result Value Ref Range Status   Specimen Description   Final    BLOOD LEFT ANTECUBITAL Performed at [facility] [address], [place], [place] [address]    Special Requests   Final    BOTTLES DRAWN AEROBIC AND ANAEROBIC Blood Culture adequate volume Performed at [facility] [address], [place], [place] [address]    Culture   Final    NO GROWTH 5 DAYS Performed at [facility], [place] [address] [place], [place] [address]    Report Status 12/26/2021 FINAL  Final  Culture, blood (Routine X 2) w Reflex to ID Panel      Status:  None   Collection Time: 12/21/21  8:00 AM   Specimen: BLOOD RIGHT HAND  Result Value Ref Range Status   Specimen Description   Final    BLOOD RIGHT HAND Performed at [facility] [address] [place], [place] [address]    Special Requests   Final    BOTTLES DRAWN AEROBIC ONLY Blood Culture adequate volume Performed at [facility] [address] [place], [place] [address]    Culture   Final    NO GROWTH 5 DAYS Performed at [facility], [place] [address] [place], [place] [address]    Report Status 12/26/2021 FINAL  Final    Labs: CBC: Recent Labs  Lab 01/04/22 0541 01/05/22 0622 01/06/22 0306 01/07/22 0305 01/08/22 0426 01/09/22 0354 01/10/22 0930  WBC 11.8* 11.6* 10.4 10.7* 8.8 7.7 8.0  NEUTROABS 9.7* 9.4* 8.2* 8.3* 6.7  --   --   HGB 8.4* 7.8* 7.9* 8.0* 8.3* 7.6* 8.2*  HCT 24.6* 22.1* 22.8* 23.0* 23.7* 22.6* 24.4*  MCV 75.5* 75.4* 74.8* 75.4* 76.0* 76.9* 78.2*  PLT 311 318 316 324 356 343 778   Basic Metabolic Panel: Recent Labs  Lab 01/04/22 0541 01/06/22 0306 01/07/22 0305 01/09/22 0354 01/10/22 0347  NA 135 139 137 138 134*  K 4.0 4.4 4.3 4.3 4.1  CL 102 109 107 107 104  CO2 '26 25 24 24 23  '$ GLUCOSE 119* 121* 114* 138* 74  BUN '15 13 12 11 11  '$ CREATININE 0.43* 0.44 0.52 0.51 0.45  CALCIUM 8.2* 8.1* 8.0* 7.9* 7.7*  MG 1.9 2.0 1.8 1.9  --   PHOS 3.9 3.6 3.9 4.1  --    Liver Function Tests: Recent Labs  Lab 01/04/22 0541 01/07/22 0305  AST 56* 34  ALT 50* 35  ALKPHOS 179* 162*  BILITOT 0.7 0.5  PROT 7.4 7.1  ALBUMIN 2.2* 2.1*   CBG: Recent Labs  Lab 01/07/22 0729 01/07/22 1558 01/08/22 0017 01/08/22 0727 01/08/22 1538  GLUCAP 118* 101* 110* 110* 109*    Discharge time spent: greater than 30 minutes.  Signed: Elmarie Shiley, MD [facility] 01/10/2022

## 2022-01-10 NOTE — NC FL2 (Signed)
[place] LEVEL OF CARE SCREENING TOOL     IDENTIFICATION  Patient Name: Katrina Donaldson Birthdate: [birth year] Sex: female Admission Date (Current Location): 12/20/2021  [facility] and [place] Number:  Herbalist and Address:  [facility],  [place], [place]      Provider Number: [id number]  Attending Physician Name and Address:  Elmarie Shiley, MD  Relative Name and Phone Number:  Emeterio Reeve (daughter) Ph: [phone]    Current Level of Care: Hospital Recommended Level of Care: [place] Prior Approval Number:    Date Approved/Denied:   PASRR Number:    Discharge Plan: SNF    Current Diagnoses: Patient Active Problem List   Diagnosis Date Noted   Bedridden 01/01/2022   Incarcerated hiatal hernia 01/01/2022   Cardiomyopathy ([place]) 12/27/2021   Acute combined systolic and diastolic heart failure ([place])    AKI (acute kidney injury) ([place]) 12/26/2021   Pressure ulcer of sacral region, stage 3 ([place]) 12/26/2021   SBO (small bowel obstruction) ([place]) 12/20/2021   Medication monitoring encounter 06/21/2021   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   Pressure injury of sacral region, stage 4 ([place]) 06/21/2021   Cecum mass    Abnormal computed tomography angiography (CTA) of abdomen and pelvis    Pressure injury of skin 01/27/2021   GI bleed 01/26/2021   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   Chronic anticoagulation 05/26/2017   Urinary retention    Acute pain of right knee    Acute lower UTI    Weakness of right lower extremity    Confusion    Hydrocephalus (HCC)    Poor appetite    Acute blood loss anemia    Post-operative pain    Hypokalemia    Leukocytosis    Thrombocytopenia (HCC)    Acute deep vein thrombosis (DVT) of femoral vein of left lower extremity ([place])    Myelopathy ([place]) 07/24/2016   Thoracic myelopathy    Depression    Benign essential HTN    History of subarachnoid hemorrhage     Hypercoagulable state ([place])    Constipation due to pain medication    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   Protein-calorie malnutrition ([place]) 12/21/2014   Thyroid activity decreased 12/21/2014   SAH (subarachnoid hemorrhage), 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 Cover wound with size appropriate piece of silver hydrofiber (Aquacel Ag+ Advantage, [place] # [id number]) top with dry gauze and secure with silicone foam turned so that "tip" is oriented away from anus.)                 Personal Care Assistance Level of Assistance  Bathing, Feeding, Dressing Bathing Assistance: Maximum assistance Feeding assistance: Independent Dressing Assistance: Maximum assistance     Functional Limitations Info  Sight, Hearing, Speech Sight Info: Impaired Hearing Info: Impaired Speech Info: Adequate    SPECIAL CARE FACTORS FREQUENCY  Contractures Contractures Info: Not present    Additional Factors Info  Code Status, Allergies Code Status Info: Full Allergies Info: Shellfish-derived products           Current Medications (01/10/2022):  This is the current hospital active medication list Current Facility-Administered Medications  Medication Dose Route Frequency Provider Last Rate Last Admin   acetaminophen (TYLENOL) tablet 1,000 mg  1,000 mg Oral Q6H Simaan, Darci Current, PA-C   1,000  mg at 01/10/22 0938   albuterol (PROVENTIL) (2.5 MG/3ML) 0.083% nebulizer solution 2.5 mg  2.5 mg Nebulization Q2H PRN Maczis, Barth Kirks, PA-C       alum & mag hydroxide-simeth (MAALOX/MYLANTA) 200-200-20 MG/5ML suspension 30 mL  30 mL Oral Q6H PRN Michael Boston, MD       atorvastatin (LIPITOR) tablet 5 mg  5 mg Oral QHS [place] Donnamarie Poag, DO   5 mg at 01/09/22 2309   azelastine (ASTELIN) 0.1 % nasal spray 2 spray  2 spray Each Nare BID PRN Jillyn Ledger, PA-C       bisacodyl (DULCOLAX) suppository 10 mg  10 mg Rectal Daily Michael Boston, MD   10 mg at 01/10/22 0939   Chlorhexidine Gluconate Cloth 2 % PADS 6 each  6 each Topical Daily Jillyn Ledger, PA-C   6 each at 01/10/22 0939   dextrose 5 %-0.45 % sodium chloride infusion   Intravenous Continuous Jillyn Ledger, PA-C 10 mL/hr at 01/08/22 2233 New Bag at 01/08/22 2233   diphenhydrAMINE (BENADRYL) injection 12.5-25 mg  12.5-25 mg Intravenous Q6H PRN Michael Boston, MD   12.5 mg at 01/03/22 2043   fluticasone (FLONASE) 50 MCG/ACT nasal spray 1 spray  1 spray Each Nare BID Jillyn Ledger, PA-C   1 spray at 01/10/22 5427   hydrALAZINE (APRESOLINE) injection 10 mg  10 mg Intravenous Q8H PRN Jillyn Ledger, PA-C   10 mg at 12/28/21 1840   hydrocortisone 1 % lotion   Topical BID PRN Jillyn Ledger, PA-C       levothyroxine (SYNTHROID) tablet 125 mcg  125 mcg Oral Q0600 Reginia Naas, RPH   125 mcg at 01/10/22 0630   lip balm (CARMEX) ointment 1 Application  1 Application Topical PRN Jillyn Ledger, PA-C   1 Application at 12/22/74 2132   lip balm (CARMEX) ointment   Topical BID Michael Boston, MD   Given at 01/10/22 2831   magic mouthwash  15 mL Oral QID PRN Michael Boston, MD       melatonin tablet 3 mg  3 mg Oral QHS Jill Alexanders, PA-C   3 mg at 01/09/22 2309   menthol-cetylpyridinium (CEPACOL) lozenge 3 mg  1 lozenge Oral PRN Michael Boston, MD       methocarbamol (ROBAXIN) tablet 500 mg  500 mg Oral Q6H Jill Alexanders, PA-C   500 mg at 01/10/22 5176   metoprolol tartrate (LOPRESSOR) tablet 12.5 mg  12.5 mg Oral BID Pixie Casino, MD   12.5 mg at 01/10/22 1607   morphine (PF) 2 MG/ML injection 2 mg  2 mg Intravenous Q3H PRN Jillyn Ledger, PA-C   2 mg at 01/06/22 0537   multivitamin with minerals tablet 1 tablet  1 tablet Oral Daily Regalado, Belkys A, MD   1 tablet at 01/10/22 0938   ondansetron (ZOFRAN) tablet 4 mg  4 mg Oral Q6H PRN Jillyn Ledger, PA-C       Or   ondansetron [facility]) injection 4  mg  4 mg Intravenous Q6H PRN Jillyn Ledger, PA-C   4 mg at 12/24/21 7322   oxyCODONE (Oxy IR/ROXICODONE) immediate release tablet 5 mg  5 mg Oral Q4H PRN Marcell Anger, MD   5 mg at 01/10/22 0254   pantoprazole (PROTONIX) EC tablet 40 mg  40 mg Oral BID Reginia Naas, RPH   40 mg at 01/10/22 2706   phenol (CHLORASEPTIC) mouth spray 2 spray  2 spray Mouth/Throat PRN Michael Boston, MD       polyvinyl alcohol (LIQUIFILM TEARS) 1.4 % ophthalmic solution 1 drop  1 drop Both Eyes PRN Jillyn Ledger, PA-C   1 drop at 01/03/22 2139   rivaroxaban (XARELTO) tablet 20 mg  20 mg Oral Q supper Marcell Anger, MD   20 mg at 01/09/22 1720   simethicone (MYLICON) 40 CB/7.6EG suspension 80 mg  80 mg Oral QID PRN Michael Boston, MD       sodium chloride (OCEAN) 0.65 % nasal spray 2 spray  2 spray Each Nare TID PRN Jillyn Ledger, PA-C       sodium chloride flush (NS) 0.9 % injection 10-40 mL  10-40 mL Intracatheter Q12H Jillyn Ledger, PA-C   10 mL at 01/09/22 2236   sodium chloride flush (NS) 0.9 % injection 10-40 mL  10-40 mL Intracatheter PRN Jillyn Ledger, PA-C   10 mL at 01/10/22 3151     Discharge Medications: Please see discharge summary for a list of discharge medications.  Relevant Imaging Results:  Relevant Lab Results:   Additional Information SSN: [id number]  Sherie Don, LCSW

## 2022-01-10 NOTE — TOC Transition Note (Signed)
Transition of Care [facility]) - CM/SW Discharge Note  Patient Details  Name: Katrina Donaldson MRN: [id number] Date of Birth: [birth year]  [facility]) CM/SW Contact:  Sherie Don, LCSW Phone Number: 01/10/2022, 1:37 PM  Clinical Narrative: Patient is medically ready to discharge back to [facility]. CSW updated Lorenza Chick in admissions at [facility] Discharge summary, discharge orders, SNF transfer report, and FL2 faxed to facility in hub. Patient will go to room 1102A and the number for report is [phone]. Medical necessity form done; PTAR scheduled. Discharge packet completed. RN updated. TOC signing off.    Final next level of care: Skilled Nursing Facility Barriers to Discharge: Barriers Resolved  Patient Goals and CMS Choice Patient states their goals for this hospitalization and ongoing recovery are:: Return to [place] CMS Medicare.gov Compare Post Acute Care list provided to:: Patient Represenative (must comment) Choice offered to / list presented to : Patient, Adult Children  Discharge Placement        Patient chooses bed at: [facility] Patient to be transferred to facility by: PTAR Patient and family notified of of transfer: 01/10/22  Discharge Plan and Services In-house Referral: Clinical Social Work Post Acute Care Choice: [place]          DME Arranged: N/A DME Agency: NA  Readmission Risk Interventions    01/08/2022   12:59 PM  Readmission Risk Prevention Plan  Transportation Screening Complete  HRI or [place] Complete  Social Work Consult for [place] Planning/Counseling Complete  Palliative Care Screening Not Applicable  Medication Review Press photographer) Complete

## 2022-01-10 NOTE — Progress Notes (Signed)
Progress Note  Patient Name: Katrina Donaldson Date of Encounter: 01/10/2022  [facility] Cardiologist: Skeet Latch, MD   Subjective   Hypotensive yesterday. Did not tolerate addition of low dose ARB, so I stopped it. Noted to have a brief episode of 2:1 AV block this morning, ?related to apnea - no symptoms reported.  Inpatient Medications    Scheduled Meds:  acetaminophen  1,000 mg Oral Q6H   atorvastatin  5 mg Oral QHS   bisacodyl  10 mg Rectal Daily   Chlorhexidine Gluconate Cloth  6 each Topical Daily   fluticasone  1 spray Each Nare BID   levothyroxine  125 mcg Oral Q0600   lip balm   Topical BID   melatonin  3 mg Oral QHS   methocarbamol  500 mg Oral Q6H   metoprolol tartrate  12.5 mg Oral BID   multivitamin with minerals  1 tablet Oral Daily   pantoprazole  40 mg Oral BID   rivaroxaban  20 mg Oral Q supper   sodium chloride flush  10-40 mL Intracatheter Q12H   Continuous Infusions:  dextrose 5 % and 0.45% NaCl 10 mL/hr at 01/08/22 2233   PRN Meds: albuterol, alum & mag hydroxide-simeth, azelastine, diphenhydrAMINE, hydrALAZINE, hydrocortisone, lip balm, magic mouthwash, menthol-cetylpyridinium, morphine injection, ondansetron **OR** ondansetron (ZOFRAN) IV, oxyCODONE, phenol, polyvinyl alcohol, simethicone, sodium chloride, sodium chloride flush   Vital Signs    Vitals:   01/09/22 1735 01/09/22 2112 01/10/22 0156 01/10/22 0639  BP: 103/66 109/79 111/71 125/73  Pulse: (!) 108 (!) 108 99 (!) 109  Resp: '18 16 18 18  '$ Temp: 98.2 F (36.8 C) 99 F (37.2 C) 98.6 F (37 C) 98.6 F (37 C)  TempSrc:  Oral Oral Oral  SpO2: 97% 90% 100% 99%  Weight:      Height:        Intake/Output Summary (Last 24 hours) at 01/10/2022 1052 Last data filed at 01/10/2022 1000 Gross per 24 hour  Intake 360 ml  Output 950 ml  Net -590 ml      12/28/2021    9:49 AM 12/26/2021   12:30 PM 12/23/2021    6:00 AM  Last 3 Weights  Weight (lbs) 185 lb 6.5 oz 185 lb 6.5 oz 177  lb 4 oz  Weight (kg) 84.1 kg 84.1 kg 80.4 kg      Telemetry    Sinus tachycardia with 2:1 AV block overnight - Personally Reviewed  ECG    N/A  Physical Exam   GEN: No acute distress.   Neck: No JVD Cardiac: RRR, no murmurs, rubs, or gallops.  Respiratory: Clear to auscultation bilaterally. GI: Soft, nontender, non-distended  MS: No edema; No deformity. Neuro:  Nonfocal  Psych: Normal affect   Labs    High Sensitivity Troponin:   Recent Labs  Lab 12/26/21 1658  TROPONINIHS 12     Chemistry Recent Labs  Lab 01/04/22 0541 01/06/22 0306 01/07/22 0305 01/09/22 0354 01/10/22 0347  NA 135 139 137 138 134*  K 4.0 4.4 4.3 4.3 4.1  CL 102 109 107 107 104  CO2 '26 25 24 24 23  '$ GLUCOSE 119* 121* 114* 138* 74  BUN '15 13 12 11 11  '$ CREATININE 0.43* 0.44 0.52 0.51 0.45  CALCIUM 8.2* 8.1* 8.0* 7.9* 7.7*  MG 1.9 2.0 1.8 1.9  --   PROT 7.4  --  7.1  --   --   ALBUMIN 2.2*  --  2.1*  --   --  AST 56*  --  34  --   --   ALT 50*  --  35  --   --   ALKPHOS 179*  --  162*  --   --   BILITOT 0.7  --  0.5  --   --   GFRNONAA >60 >60 >60 >60 >60  ANIONGAP '7 5 6 7 7    '$ Lipids  Recent Labs  Lab 01/07/22 0305  TRIG 120    Hematology Recent Labs  Lab 01/08/22 0426 01/09/22 0354 01/10/22 0930  WBC 8.8 7.7 8.0  RBC 3.12* 2.94* 3.12*  HGB 8.3* 7.6* 8.2*  HCT 23.7* 22.6* 24.4*  MCV 76.0* 76.9* 78.2*  MCH 26.6 25.9* 26.3  MCHC 35.0 33.6 33.6  RDW 19.1* 18.8* 19.4*  PLT 356 343 390   Thyroid  No results for input(s): "TSH", "FREET4" in the last 168 hours.   BNPNo results for input(s): "BNP", "PROBNP" in the last 168 hours.  DDimer No results for input(s): "DDIMER" in the last 168 hours.   Radiology    No results found.  Cardiac Studies   ECHO: 12/26/2021  1. Left ventricular ejection fraction, by estimation, is 30 to 35%. The  left ventricle has moderately decreased function. The left ventricle  demonstrates global hypokinesis. Left ventricular diastolic  parameters are  consistent with Grade I diastolic dysfunction (impaired relaxation).   2. Right ventricular systolic function is normal. The right ventricular  size is normal.   3. The mitral valve is normal in structure. No evidence of mitral valve  regurgitation. No evidence of mitral stenosis.   4. The aortic valve is calcified. There is mild calcification of the  aortic valve. There is mild thickening of the aortic valve. Aortic valve  regurgitation is not visualized. Aortic valve sclerosis is present, with  no evidence of aortic valve stenosis.   5. The inferior vena cava is normal in size with greater than 50%  respiratory variability, suggesting right atrial pressure of 3 mmHg.    [place] LOWER EXTREMITY - DVT STUDY 12/26/2021 Comparison Study: 07/25/2016 - - Findings consistent with acute deep vein  thrombosis involving the right common femoral vein, right profunda femoris vein, right femoral vein, right popliteal vein, right posterial  tibial vein, and right peroneal vein.   Patient Profile     66 y.o. 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 Cardiology is asked for evaluation of tachycardia and reviewed use LV function.  Assessment & Plan    1.  New systolic heart failure -Echocardiogram this admission with LV function of 30 to 35% and grade 1 diastolic dysfunction.  EF was normal in 2016.  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 Repeat limited echo yesterday shows improved LVEF to 40-45%. -Continue metoprolol tartrate 12.5 mg BID -Did not tolerate addition of ARB d/t hypotension, BP appears better today.  2.  Tachycardia -Symptoms multi factorial from underlying cardiomyopathy and metabolic condition as well as acute illness. - Continue low dose  metoprolol. Had transient 2:1 AV block overnight - ?related to OSA. Not symptomatic -monitor. Ok to continue BB  3.  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

## 2022-01-11 DIAGNOSIS — I82509 Chronic embolism and thrombosis of unspecified deep veins of unspecified lower extremity: Secondary | ICD-10-CM | POA: Diagnosis not present

## 2022-01-11 DIAGNOSIS — E039 Hypothyroidism, unspecified: Secondary | ICD-10-CM | POA: Diagnosis not present

## 2022-01-11 DIAGNOSIS — D649 Anemia, unspecified: Secondary | ICD-10-CM | POA: Diagnosis not present

## 2022-01-11 DIAGNOSIS — K592 Neurogenic bowel, not elsewhere classified: Secondary | ICD-10-CM | POA: Diagnosis not present

## 2022-01-11 DIAGNOSIS — K449 Diaphragmatic hernia without obstruction or gangrene: Secondary | ICD-10-CM | POA: Diagnosis not present

## 2022-01-11 DIAGNOSIS — E46 Unspecified protein-calorie malnutrition: Secondary | ICD-10-CM | POA: Diagnosis not present

## 2022-01-11 DIAGNOSIS — I10 Essential (primary) hypertension: Secondary | ICD-10-CM | POA: Diagnosis not present

## 2022-01-11 DIAGNOSIS — I5040 Unspecified combined systolic (congestive) and diastolic (congestive) heart failure: Secondary | ICD-10-CM | POA: Diagnosis not present

## 2022-01-11 DIAGNOSIS — K56609 Unspecified intestinal obstruction, unspecified as to partial versus complete obstruction: Secondary | ICD-10-CM | POA: Diagnosis not present

## 2022-01-11 DIAGNOSIS — D6859 Other primary thrombophilia: Secondary | ICD-10-CM | POA: Diagnosis not present

## 2022-01-11 DIAGNOSIS — K440 Diaphragmatic hernia with obstruction, without gangrene: Secondary | ICD-10-CM | POA: Diagnosis not present

## 2022-01-11 DIAGNOSIS — N179 Acute kidney failure, unspecified: Secondary | ICD-10-CM | POA: Diagnosis not present

## 2022-01-11 DIAGNOSIS — G8220 Paraplegia, unspecified: Secondary | ICD-10-CM | POA: Diagnosis not present

## 2022-01-11 DIAGNOSIS — R1312 Dysphagia, oropharyngeal phase: Secondary | ICD-10-CM | POA: Diagnosis not present

## 2022-01-14 DIAGNOSIS — K440 Diaphragmatic hernia with obstruction, without gangrene: Secondary | ICD-10-CM | POA: Diagnosis not present

## 2022-01-14 DIAGNOSIS — R1312 Dysphagia, oropharyngeal phase: Secondary | ICD-10-CM | POA: Diagnosis not present

## 2022-01-15 DIAGNOSIS — R1312 Dysphagia, oropharyngeal phase: Secondary | ICD-10-CM | POA: Diagnosis not present

## 2022-01-15 DIAGNOSIS — T8189XA Other complications of procedures, not elsewhere classified, initial encounter: Secondary | ICD-10-CM | POA: Diagnosis not present

## 2022-01-15 DIAGNOSIS — K440 Diaphragmatic hernia with obstruction, without gangrene: Secondary | ICD-10-CM | POA: Diagnosis not present

## 2022-01-15 DIAGNOSIS — L89154 Pressure ulcer of sacral region, stage 4: Secondary | ICD-10-CM | POA: Diagnosis not present

## 2022-01-16 DIAGNOSIS — R1312 Dysphagia, oropharyngeal phase: Secondary | ICD-10-CM | POA: Diagnosis not present

## 2022-01-16 DIAGNOSIS — L89154 Pressure ulcer of sacral region, stage 4: Secondary | ICD-10-CM | POA: Diagnosis not present

## 2022-01-16 DIAGNOSIS — K440 Diaphragmatic hernia with obstruction, without gangrene: Secondary | ICD-10-CM | POA: Diagnosis not present

## 2022-01-16 DIAGNOSIS — T8189XD Other complications of procedures, not elsewhere classified, subsequent encounter: Secondary | ICD-10-CM | POA: Diagnosis not present

## 2022-01-17 DIAGNOSIS — R1312 Dysphagia, oropharyngeal phase: Secondary | ICD-10-CM | POA: Diagnosis not present

## 2022-01-17 DIAGNOSIS — K440 Diaphragmatic hernia with obstruction, without gangrene: Secondary | ICD-10-CM | POA: Diagnosis not present

## 2022-01-18 DIAGNOSIS — R1312 Dysphagia, oropharyngeal phase: Secondary | ICD-10-CM | POA: Diagnosis not present

## 2022-01-18 DIAGNOSIS — G5793 Unspecified mononeuropathy of bilateral lower limbs: Secondary | ICD-10-CM | POA: Diagnosis not present

## 2022-01-18 DIAGNOSIS — F32A Depression, unspecified: Secondary | ICD-10-CM | POA: Diagnosis not present

## 2022-01-18 DIAGNOSIS — L89154 Pressure ulcer of sacral region, stage 4: Secondary | ICD-10-CM | POA: Diagnosis not present

## 2022-01-18 DIAGNOSIS — K440 Diaphragmatic hernia with obstruction, without gangrene: Secondary | ICD-10-CM | POA: Diagnosis not present

## 2022-01-18 DIAGNOSIS — D649 Anemia, unspecified: Secondary | ICD-10-CM | POA: Diagnosis not present

## 2022-01-18 DIAGNOSIS — E039 Hypothyroidism, unspecified: Secondary | ICD-10-CM | POA: Diagnosis not present

## 2022-01-18 DIAGNOSIS — K565 Intestinal adhesions [bands], unspecified as to partial versus complete obstruction: Secondary | ICD-10-CM | POA: Diagnosis not present

## 2022-01-18 DIAGNOSIS — E119 Type 2 diabetes mellitus without complications: Secondary | ICD-10-CM | POA: Diagnosis not present

## 2022-01-18 DIAGNOSIS — I5040 Unspecified combined systolic (congestive) and diastolic (congestive) heart failure: Secondary | ICD-10-CM | POA: Diagnosis not present

## 2022-01-21 DIAGNOSIS — K440 Diaphragmatic hernia with obstruction, without gangrene: Secondary | ICD-10-CM | POA: Diagnosis not present

## 2022-01-21 DIAGNOSIS — R1312 Dysphagia, oropharyngeal phase: Secondary | ICD-10-CM | POA: Diagnosis not present

## 2022-01-22 DIAGNOSIS — L89154 Pressure ulcer of sacral region, stage 4: Secondary | ICD-10-CM | POA: Diagnosis not present

## 2022-01-22 DIAGNOSIS — T8189XA Other complications of procedures, not elsewhere classified, initial encounter: Secondary | ICD-10-CM | POA: Diagnosis not present

## 2022-01-25 ENCOUNTER — Encounter: Payer: Self-pay | Admitting: Internal Medicine

## 2022-01-29 DIAGNOSIS — T8189XA Other complications of procedures, not elsewhere classified, initial encounter: Secondary | ICD-10-CM | POA: Diagnosis not present

## 2022-01-29 DIAGNOSIS — L89154 Pressure ulcer of sacral region, stage 4: Secondary | ICD-10-CM | POA: Diagnosis not present

## 2022-02-04 ENCOUNTER — Ambulatory Visit (HOSPITAL_BASED_OUTPATIENT_CLINIC_OR_DEPARTMENT_OTHER): Payer: Medicare Other | Admitting: Family

## 2022-02-05 DIAGNOSIS — T8189XA Other complications of procedures, not elsewhere classified, initial encounter: Secondary | ICD-10-CM | POA: Diagnosis not present

## 2022-02-05 DIAGNOSIS — L89154 Pressure ulcer of sacral region, stage 4: Secondary | ICD-10-CM | POA: Diagnosis not present

## 2022-02-11 DIAGNOSIS — E559 Vitamin D deficiency, unspecified: Secondary | ICD-10-CM | POA: Diagnosis not present

## 2022-02-11 DIAGNOSIS — E119 Type 2 diabetes mellitus without complications: Secondary | ICD-10-CM | POA: Diagnosis not present

## 2022-02-12 DIAGNOSIS — L89154 Pressure ulcer of sacral region, stage 4: Secondary | ICD-10-CM | POA: Diagnosis not present

## 2022-02-12 DIAGNOSIS — T8189XA Other complications of procedures, not elsewhere classified, initial encounter: Secondary | ICD-10-CM | POA: Diagnosis not present

## 2022-02-14 DIAGNOSIS — L89154 Pressure ulcer of sacral region, stage 4: Secondary | ICD-10-CM | POA: Diagnosis not present

## 2022-02-14 DIAGNOSIS — T8189XD Other complications of procedures, not elsewhere classified, subsequent encounter: Secondary | ICD-10-CM | POA: Diagnosis not present

## 2022-02-19 DIAGNOSIS — L89154 Pressure ulcer of sacral region, stage 4: Secondary | ICD-10-CM | POA: Diagnosis not present

## 2022-02-19 DIAGNOSIS — T8189XA Other complications of procedures, not elsewhere classified, initial encounter: Secondary | ICD-10-CM | POA: Diagnosis not present

## 2022-02-22 DIAGNOSIS — G5793 Unspecified mononeuropathy of bilateral lower limbs: Secondary | ICD-10-CM | POA: Diagnosis not present

## 2022-02-22 DIAGNOSIS — E039 Hypothyroidism, unspecified: Secondary | ICD-10-CM | POA: Diagnosis not present

## 2022-02-22 DIAGNOSIS — E559 Vitamin D deficiency, unspecified: Secondary | ICD-10-CM | POA: Diagnosis not present

## 2022-02-22 DIAGNOSIS — D649 Anemia, unspecified: Secondary | ICD-10-CM | POA: Diagnosis not present

## 2022-02-22 DIAGNOSIS — K219 Gastro-esophageal reflux disease without esophagitis: Secondary | ICD-10-CM | POA: Diagnosis not present

## 2022-02-22 DIAGNOSIS — E119 Type 2 diabetes mellitus without complications: Secondary | ICD-10-CM | POA: Diagnosis not present

## 2022-02-22 DIAGNOSIS — M4628 Osteomyelitis of vertebra, sacral and sacrococcygeal region: Secondary | ICD-10-CM | POA: Diagnosis not present

## 2022-02-22 DIAGNOSIS — E785 Hyperlipidemia, unspecified: Secondary | ICD-10-CM | POA: Diagnosis not present

## 2022-02-22 DIAGNOSIS — I5040 Unspecified combined systolic (congestive) and diastolic (congestive) heart failure: Secondary | ICD-10-CM | POA: Diagnosis not present

## 2022-02-25 DIAGNOSIS — E119 Type 2 diabetes mellitus without complications: Secondary | ICD-10-CM | POA: Diagnosis not present

## 2022-02-26 DIAGNOSIS — L89154 Pressure ulcer of sacral region, stage 4: Secondary | ICD-10-CM | POA: Diagnosis not present

## 2022-02-26 DIAGNOSIS — T8189XA Other complications of procedures, not elsewhere classified, initial encounter: Secondary | ICD-10-CM | POA: Diagnosis not present

## 2022-03-05 DIAGNOSIS — T8189XA Other complications of procedures, not elsewhere classified, initial encounter: Secondary | ICD-10-CM | POA: Diagnosis not present

## 2022-03-05 DIAGNOSIS — L89154 Pressure ulcer of sacral region, stage 4: Secondary | ICD-10-CM | POA: Diagnosis not present

## 2022-03-06 DIAGNOSIS — K5900 Constipation, unspecified: Secondary | ICD-10-CM | POA: Diagnosis not present

## 2022-03-06 DIAGNOSIS — I739 Peripheral vascular disease, unspecified: Secondary | ICD-10-CM | POA: Diagnosis not present

## 2022-03-06 DIAGNOSIS — G5793 Unspecified mononeuropathy of bilateral lower limbs: Secondary | ICD-10-CM | POA: Diagnosis not present

## 2022-03-06 DIAGNOSIS — B351 Tinea unguium: Secondary | ICD-10-CM | POA: Diagnosis not present

## 2022-03-06 DIAGNOSIS — I10 Essential (primary) hypertension: Secondary | ICD-10-CM | POA: Diagnosis not present

## 2022-03-06 DIAGNOSIS — Z8719 Personal history of other diseases of the digestive system: Secondary | ICD-10-CM | POA: Diagnosis not present

## 2022-03-07 ENCOUNTER — Ambulatory Visit (INDEPENDENT_AMBULATORY_CARE_PROVIDER_SITE_OTHER): Payer: Medicare Other | Admitting: Infectious Diseases

## 2022-03-07 ENCOUNTER — Other Ambulatory Visit: Payer: Self-pay

## 2022-03-07 ENCOUNTER — Encounter: Payer: Self-pay | Admitting: Infectious Diseases

## 2022-03-07 ENCOUNTER — Telehealth: Payer: Self-pay

## 2022-03-07 VITALS — BP 134/85 | HR 83 | Temp 98.0°F | Wt 152.0 lb

## 2022-03-07 DIAGNOSIS — L89159 Pressure ulcer of sacral region, unspecified stage: Secondary | ICD-10-CM | POA: Diagnosis not present

## 2022-03-07 DIAGNOSIS — Z743 Need for continuous supervision: Secondary | ICD-10-CM | POA: Diagnosis not present

## 2022-03-07 DIAGNOSIS — I110 Hypertensive heart disease with heart failure: Secondary | ICD-10-CM | POA: Diagnosis not present

## 2022-03-07 DIAGNOSIS — E46 Unspecified protein-calorie malnutrition: Secondary | ICD-10-CM | POA: Diagnosis not present

## 2022-03-07 DIAGNOSIS — E039 Hypothyroidism, unspecified: Secondary | ICD-10-CM | POA: Diagnosis not present

## 2022-03-07 DIAGNOSIS — M4714 Other spondylosis with myelopathy, thoracic region: Secondary | ICD-10-CM | POA: Diagnosis not present

## 2022-03-07 DIAGNOSIS — Z7901 Long term (current) use of anticoagulants: Secondary | ICD-10-CM | POA: Diagnosis not present

## 2022-03-07 DIAGNOSIS — R531 Weakness: Secondary | ICD-10-CM | POA: Diagnosis not present

## 2022-03-07 DIAGNOSIS — K449 Diaphragmatic hernia without obstruction or gangrene: Secondary | ICD-10-CM | POA: Diagnosis not present

## 2022-03-07 DIAGNOSIS — G8220 Paraplegia, unspecified: Secondary | ICD-10-CM | POA: Diagnosis not present

## 2022-03-07 DIAGNOSIS — R194 Change in bowel habit: Secondary | ICD-10-CM | POA: Diagnosis not present

## 2022-03-07 DIAGNOSIS — Z7401 Bed confinement status: Secondary | ICD-10-CM | POA: Diagnosis not present

## 2022-03-07 DIAGNOSIS — I82511 Chronic embolism and thrombosis of right femoral vein: Secondary | ICD-10-CM | POA: Diagnosis not present

## 2022-03-07 DIAGNOSIS — G839 Paralytic syndrome, unspecified: Secondary | ICD-10-CM | POA: Diagnosis not present

## 2022-03-07 DIAGNOSIS — I5040 Unspecified combined systolic (congestive) and diastolic (congestive) heart failure: Secondary | ICD-10-CM | POA: Diagnosis not present

## 2022-03-07 NOTE — Progress Notes (Signed)
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   Pressure injury of sacral region, stage 4 ([place]) 06/21/2021   Cecum mass    Abnormal computed tomography angiography (CTA) of abdomen and pelvis    Pressure injury of skin 01/27/2021   GI bleed 01/26/2021   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   Chronic anticoagulation 05/26/2017   Urinary retention    Acute pain of right knee    Acute lower UTI    Weakness of right lower extremity    Confusion    Hydrocephalus (HCC)    Poor appetite    Acute blood loss anemia    Post-operative pain    Hypokalemia    Leukocytosis    Thrombocytopenia (HCC)    Acute deep vein thrombosis (DVT) of femoral vein of left lower extremity (HCC)    Myelopathy ([place]) 07/24/2016   Thoracic myelopathy    Depression    Benign essential HTN    History of subarachnoid hemorrhage    Hypercoagulable state ([place])    Constipation due to pain medication    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   AMINO ACIDS-PROTEIN HYDROLYS (PRO-STAT AWC) LIQD    Take 30 mLs by mouth 3 (three)  times daily.   ANTISEPTIC ORAL RINSE (BIOTENE) LIQD    30 mLs by Mouth Rinse route daily as needed for dry mouth.   ARTIFICIAL TEARS 0.2-0.2-1 % SOLN    Place 1 drop into both eyes every 2 (two) hours as needed (for dryness/irritation).   ATORVASTATIN (LIPITOR) 10 MG TABLET    Take 5 mg by mouth at bedtime.   AZELASTINE (ASTELIN) 0.1 % NASAL SPRAY    Place 2 sprays into both nostrils 2 (two) times daily as needed for rhinitis or allergies.   BISACODYL (DULCOLAX) 5 MG EC TABLET    Take 2 tablets (10 mg total) by mouth daily as needed for moderate constipation.   DEXTRAN 70-HYPROMELLOSE (GENTEAL TEARS) 0.1-0.3 % SOLN    Place 2 drops into both eyes at bedtime.   FEEDING SUPPLEMENT (ENSURE ENLIVE / ENSURE PLUS) LIQD    Take 237 mLs by mouth 3 (three) times daily between meals.   FERROUS GLUCONATE (FERGON) 324 MG TABLET    Take 324 mg by mouth every other day.   FLUTICASONE (FLONASE) 50 MCG/ACT NASAL SPRAY    Place 1 spray into both nostrils 2 (two) times daily.   GLYCERIN-HYPROMELLOSE-PEG 400 (ARTIFICIAL TEARS) 0.2-0.2-1 % SOLN    Place 1 drop into both eyes every 2 (two) hours as needed (for dryness).   HYDROCORTISONE CREAM 1 %    Apply 1 Application topically 2 (two) times daily  as needed for itching.   LEVOTHYROXINE (SYNTHROID) 125 MCG TABLET    Take 125 mcg by mouth daily before breakfast.   MELATONIN 3 MG TABS TABLET    Take 1 tablet (3 mg total) by mouth at bedtime.   MENTHOL, TOPICAL ANALGESIC, (BIOFREEZE) 4 % GEL    Apply 1 application  topically See admin instructions. Apply to shoulders and legs 2 times a day   METHOCARBAMOL (ROBAXIN) 500 MG TABLET    Take 1 tablet (500 mg total) by mouth every 6 (six) hours as needed for muscle spasms.   METOPROLOL TARTRATE (LOPRESSOR) 25 MG TABLET    Take 1 tablet (25 mg total) by mouth 2 (two) times daily.   MULTIPLE VITAMIN (MULTIVITAMIN WITH MINERALS) TABS TABLET    Take 1 tablet by mouth daily.   OXYCODONE (OXY IR/ROXICODONE) 5 MG IMMEDIATE RELEASE  TABLET    Take 1 tablet (5 mg total) by mouth every 4 (four) hours as needed for moderate pain.   PANTOPRAZOLE (PROTONIX) 40 MG TABLET    Take 1 tablet (40 mg total) by mouth 2 (two) times daily.   PAROXETINE (PAXIL) 10 MG TABLET    Take 10 mg by mouth in the morning.   POLYETHYLENE GLYCOL POWDER (GLYCOLAX/MIRALAX) 17 GM/SCOOP POWDER    Take 17 g by mouth daily as needed for mild constipation (TO BE MIXED WITH 6 OUNCES OF WATER).   RIVAROXABAN (XARELTO) 20 MG TABS TABLET    Take 1 tablet (20 mg total) by mouth daily with supper.   SIMETHICONE 180 MG CAPS    Take 360 mg by mouth daily as needed (for gas).   SODIUM CHLORIDE (OCEAN) 0.65 % SOLN NASAL SPRAY    Place 2 sprays into both nostrils 3 (three) times daily as needed for congestion.  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 Patient admitted 6/22-7/13 for high grade SBO, underwent exploratory laparotomy with lysis of adhesions on 12/28/20. She was also placed on TPN post op briefly before return of bowel function.   03/07/22 Patient accompanied by staff from the facility. She tells me her sacral wound got somewhat worse during her last hospitalization but it is getting better. She denies fevers, chills, sweats. Denies nausea, vomiting and diarrhea. Denies any drainage from the sacral wound. She is getting regular wound care at the facility and per their notes, sacral wound is improving and also decreasing in size. She is not on any antibiotics per MAR.   Review of Systems: All other systems reviewed with pertinent positives and negatives as listed  above  Past Medical History:  Diagnosis Date   AKI (acute kidney injury) ([place]) 12/26/2021   Arachnoid cyst    Brain aneurysm    Chronic anticoagulation 05/26/2017   Depression    History of DVT (deep vein thrombosis)    Hypercoagulable state ([place])    Hypertension    Hypothyroidism    Stroke [facility]) 2016   [facility] 11/29/14   Wheelchair bound    Past Surgical History:  Procedure Laterality Date   ABDOMINAL HYSTERECTOMY     patient denies   BIOPSY  01/31/2021   Procedure:  BIOPSY;  Surgeon: Jerene Bears, MD;  Location: [facility] ENDOSCOPY;  Service: Gastroenterology;;   BRAIN SURGERY     aneurysm   COLONOSCOPY WITH PROPOFOL N/A 01/31/2021   Procedure: COLONOSCOPY WITH PROPOFOL;  Surgeon: Jerene Bears, MD;  Location: [place];  Service: Gastroenterology;  Laterality: N/A;   ESOPHAGOGASTRODUODENOSCOPY (EGD) WITH PROPOFOL N/A 01/29/2021   Procedure: ESOPHAGOGASTRODUODENOSCOPY (EGD) WITH PROPOFOL;  Surgeon: Jerene Bears, MD;  Location: [facility] ENDOSCOPY;  Service: Gastroenterology;  Laterality: N/A;   goiter     LAMINECTOMY N/A 07/19/2016   Procedure: LAMINECTOMY THORACIC FOUR THORACIC FIVE, THORACIC SIX TO THORACIC EIGHT,THORACIC TEN TO LUMBAR ONE, FENESTRATION OF ARACHNOID CYSTS;  Surgeon: Consuella Lose, MD;  Location: [place];  Service: Neurosurgery;  Laterality: N/A;   LAMINECTOMY N/A 06/03/2017   Procedure: THORACIC THREE - THORACIC FIVE LAMINECTOMY, FENESTRATION OF ARACHNOID CYST;  Surgeon: Consuella Lose, MD;  Location: [place];  Service: Neurosurgery;  Laterality: N/A;   LAPAROSCOPIC PARTIAL COLECTOMY N/A 02/01/2021   Procedure: LAPAROSCOPIC ASSISTED PARTIAL COLECTOMY;  Surgeon: Georganna Skeans, MD;  Location: [place];  Service: General;  Laterality: N/A;   LAPAROTOMY N/A 12/28/2021   Procedure: EXPLORATORY LAPAROTOMY, LYSIS OF ADHESIONS;  Surgeon: Stark Klein, MD;  Location: [facility];  Service: General;  Laterality: N/A;   THYROIDECTOMY     TUBAL LIGATION       Social History   Tobacco Use    Smoking status: Never   Smokeless tobacco: Never  Vaping Use   Vaping Use: Never used  Substance Use Topics   Alcohol use: No    Alcohol/week: 0.0 standard drinks of alcohol   Drug use: No    No family history on file.  Allergies  Allergen Reactions   Shellfish-Derived Products Other (See Comments)    ALLERGIC TO SHRIMP!! Made the tongue "feel strange" Per patient, she has since eaten crab legs and did not have another reaction.     Health Maintenance  Topic Date Due   COVID-19 Vaccine (1) Never done   URINE MICROALBUMIN  Never done   Hepatitis C Screening  Never done   TETANUS/TDAP  Never done   MAMMOGRAM  Never done   Zoster Vaccines- Shingrix (1 of 2) Never done   Pneumonia Vaccine 85+ Years old (1 - PCV) Never done   DEXA SCAN  Never done   INFLUENZA VACCINE  Never done   COLONOSCOPY (Pts 45-37yr Insurance coverage will need to be confirmed)  02/01/2031   HPV VACCINES  Aged Out    Objective: BP 134/85   Pulse 83   Temp 98 F (36.7 C) (Temporal)   Wt 152 lb (68.9 kg) Comment: Weight per facility documentation, taken 03/01/22  SpO2 99%   BMI 24.53 kg/m     Physical Exam Constitutional:      Appearance: Normal appearance.  Lying in the stretcher HENT:     Head: Normocephalic and atraumatic.      Mouth: Mucous membranes are moist.  Eyes:    Conjunctiva/sclera: Conjunctivae normal.     Pupils: Pupils are equal, round  Cardiovascular:     Rate and Rhythm: Normal rate and regular rhythm.     Heart sounds:   Pulmonary:     Effort: Pulmonary effort is normal.     Breath sounds:   Abdominal:     General:     Palpations: Abdomen is soft.   Musculoskeletal:        General: Not examined  Skin:    General: Skin is  warm and dry. Abdominal surgical wound had a bandage C/D/I    Comments: Sacral Decubitus Ulcer - looks clean with no active drainage or surrounding cellulitis, fluctuance or crepitus        Neurological:     General:     Mental Status:  She is alert and oriented to person, place, and time.   Psychiatric:        Mood and Affect: Mood normal.   Lab Results Lab Results  Component Value Date   WBC 8.0 01/10/2022   HGB 8.2 (L) 01/10/2022   HCT 24.4 (L) 01/10/2022   MCV 78.2 (L) 01/10/2022   PLT 390 01/10/2022    Lab Results  Component Value Date   CREATININE 0.45 01/10/2022   BUN 11 01/10/2022   NA 134 (L) 01/10/2022   K 4.1 01/10/2022   CL 104 01/10/2022   CO2 23 01/10/2022    Lab Results  Component Value Date   ALT 35 01/07/2022   AST 34 01/07/2022   ALKPHOS 162 (H) 01/07/2022   BILITOT 0.5 01/07/2022    Lab Results  Component Value Date   TRIG 120 01/07/2022   No results found for: "LABRPR", "RPRTITER" No results found for: "HIV1RNAQUANT", "HIV1RNAVL", "CD4TABS"  Assessment/Plan # Sacral Osteomyelitis/Stage 4 Decubitus Sacral Ulcer  Has completed 6 weeks of abtx previously  Wound care notes from facility from 9/5  reviewed where wound is healthy , improving ( see media) Follow-up with wound care Adequate nutrition , off loading  Follow up as needed    # Arachnoid Cyst/SAH/Thoracic Myelopathy with ?Paraplegia Wheelchair>> recently bedbound  I have personally spent 42 minutes involved in face-to-face and non-face-to-face activities for this patient on the day of the visit. Professional time spent includes the following activities: Preparing to see the patient (review of tests), Obtaining and/or reviewing separately obtained history (admission/discharge record), Performing a medically appropriate examination and/or evaluation , Ordering medications/tests/procedures, referring and communicating with other health care professionals, Documenting clinical information in the EMR, Independently interpreting results (not separately reported), Communicating results to the patient/family/caregiver, Counseling and educating the patient/family/caregiver and Care coordination (not separately reported).   Wilber Oliphant, [place] [facility] 03/07/2022, 10:26 AM

## 2022-03-07 NOTE — Telephone Encounter (Signed)
[facility] [phone]) and spoke with patient's RN, Aflasia, to review Report of Consultation from patient's visit with Dr. West Bali.  Per written instructions from Dr. West Bali, continue wound care, offloading, and adequate nutrition. Patient to follow up as needed.  RN stated understanding and had no additional questions.  Binnie Kand, RN

## 2022-03-12 DIAGNOSIS — T8189XA Other complications of procedures, not elsewhere classified, initial encounter: Secondary | ICD-10-CM | POA: Diagnosis not present

## 2022-03-12 DIAGNOSIS — L89154 Pressure ulcer of sacral region, stage 4: Secondary | ICD-10-CM | POA: Diagnosis not present

## 2022-03-14 DIAGNOSIS — G839 Paralytic syndrome, unspecified: Secondary | ICD-10-CM | POA: Diagnosis not present

## 2022-03-14 DIAGNOSIS — K592 Neurogenic bowel, not elsewhere classified: Secondary | ICD-10-CM | POA: Diagnosis not present

## 2022-03-14 DIAGNOSIS — L89154 Pressure ulcer of sacral region, stage 4: Secondary | ICD-10-CM | POA: Diagnosis not present

## 2022-03-19 DIAGNOSIS — L89154 Pressure ulcer of sacral region, stage 4: Secondary | ICD-10-CM | POA: Diagnosis not present

## 2022-03-26 DIAGNOSIS — L89154 Pressure ulcer of sacral region, stage 4: Secondary | ICD-10-CM | POA: Diagnosis not present

## 2022-04-02 DIAGNOSIS — L89154 Pressure ulcer of sacral region, stage 4: Secondary | ICD-10-CM | POA: Diagnosis not present

## 2022-04-04 DIAGNOSIS — E46 Unspecified protein-calorie malnutrition: Secondary | ICD-10-CM | POA: Diagnosis not present

## 2022-04-04 DIAGNOSIS — D539 Nutritional anemia, unspecified: Secondary | ICD-10-CM | POA: Diagnosis not present

## 2022-04-04 DIAGNOSIS — K5900 Constipation, unspecified: Secondary | ICD-10-CM | POA: Diagnosis not present

## 2022-04-04 DIAGNOSIS — G839 Paralytic syndrome, unspecified: Secondary | ICD-10-CM | POA: Diagnosis not present

## 2022-04-04 DIAGNOSIS — K219 Gastro-esophageal reflux disease without esophagitis: Secondary | ICD-10-CM | POA: Diagnosis not present

## 2022-04-04 DIAGNOSIS — E119 Type 2 diabetes mellitus without complications: Secondary | ICD-10-CM | POA: Diagnosis not present

## 2022-04-04 DIAGNOSIS — Z9109 Other allergy status, other than to drugs and biological substances: Secondary | ICD-10-CM | POA: Diagnosis not present

## 2022-04-04 DIAGNOSIS — G5793 Unspecified mononeuropathy of bilateral lower limbs: Secondary | ICD-10-CM | POA: Diagnosis not present

## 2022-04-04 DIAGNOSIS — I110 Hypertensive heart disease with heart failure: Secondary | ICD-10-CM | POA: Diagnosis not present

## 2022-04-04 DIAGNOSIS — G8220 Paraplegia, unspecified: Secondary | ICD-10-CM | POA: Diagnosis not present

## 2022-04-04 DIAGNOSIS — E785 Hyperlipidemia, unspecified: Secondary | ICD-10-CM | POA: Diagnosis not present

## 2022-04-06 DIAGNOSIS — E876 Hypokalemia: Secondary | ICD-10-CM | POA: Diagnosis not present

## 2022-04-06 DIAGNOSIS — E039 Hypothyroidism, unspecified: Secondary | ICD-10-CM | POA: Diagnosis not present

## 2022-04-06 DIAGNOSIS — D649 Anemia, unspecified: Secondary | ICD-10-CM | POA: Diagnosis not present

## 2022-04-08 DIAGNOSIS — K440 Diaphragmatic hernia with obstruction, without gangrene: Secondary | ICD-10-CM | POA: Diagnosis not present

## 2022-04-09 DIAGNOSIS — L89154 Pressure ulcer of sacral region, stage 4: Secondary | ICD-10-CM | POA: Diagnosis not present

## 2022-04-09 DIAGNOSIS — E559 Vitamin D deficiency, unspecified: Secondary | ICD-10-CM | POA: Diagnosis not present

## 2022-04-09 DIAGNOSIS — K440 Diaphragmatic hernia with obstruction, without gangrene: Secondary | ICD-10-CM | POA: Diagnosis not present

## 2022-04-10 DIAGNOSIS — K440 Diaphragmatic hernia with obstruction, without gangrene: Secondary | ICD-10-CM | POA: Diagnosis not present

## 2022-04-11 ENCOUNTER — Telehealth: Payer: Self-pay

## 2022-04-11 DIAGNOSIS — L89154 Pressure ulcer of sacral region, stage 4: Secondary | ICD-10-CM | POA: Diagnosis not present

## 2022-04-11 DIAGNOSIS — K440 Diaphragmatic hernia with obstruction, without gangrene: Secondary | ICD-10-CM | POA: Diagnosis not present

## 2022-04-11 NOTE — Patient Instructions (Signed)
Visit Information  Thank you for taking time to visit with me today. Please don't hesitate to contact me if I can be of assistance to you.   Following are the goals we discussed today:   Goals Addressed             This Visit's Progress    COMPLETED: Patient in SNF       Care Coordination Interventions: Spoke with daughter Shirlee Limerick- patient remains in SNF           If you are experiencing a Mental Health or [place] or need someone to talk to, please call the Suicide and Crisis Lifeline: 988   Patient verbalizes understanding of instructions and care plan provided today and agrees to view in [place]. Active MyChart status and patient understanding of how to access instructions and care plan via MyChart confirmed with patient.     No further follow up required: patient in SNF  Jone Baseman, RN, MSN [place] [facility] Care Management Coordinator Direct Line [phone]

## 2022-04-11 NOTE — Patient Outreach (Signed)
  Care Coordination   Initial Visit Note   04/11/2022 Name: Vianna Venezia MRN: [id number] DOB: 1955-12-07  Ioana Louks is a 66 y.o. year old female who sees No primary care provider on file. for primary care. I  spoke with daughter Emeterio Reeve  What matters to the patients health and wellness today?  Patient remains in SNF    Goals Addressed             This Visit's Progress    COMPLETED: Patient in SNF       Care Coordination Interventions: Spoke with daughter Shirlee Limerick- patient remains in SNF          SDOH assessments and interventions completed:  No     Care Coordination Interventions Activated:  No  Care Coordination Interventions:  No, not indicated   Follow up plan: No further intervention required.   Encounter Outcome:  Pt. Visit Completed   Jone Baseman, RN, MSN [place] [facility] Care Management Coordinator Direct Line [phone]

## 2022-04-12 DIAGNOSIS — K440 Diaphragmatic hernia with obstruction, without gangrene: Secondary | ICD-10-CM | POA: Diagnosis not present

## 2022-04-16 DIAGNOSIS — L89154 Pressure ulcer of sacral region, stage 4: Secondary | ICD-10-CM | POA: Diagnosis not present

## 2022-04-23 DIAGNOSIS — L89154 Pressure ulcer of sacral region, stage 4: Secondary | ICD-10-CM | POA: Diagnosis not present

## 2022-04-30 DIAGNOSIS — L89154 Pressure ulcer of sacral region, stage 4: Secondary | ICD-10-CM | POA: Diagnosis not present

## 2022-05-07 DIAGNOSIS — L89154 Pressure ulcer of sacral region, stage 4: Secondary | ICD-10-CM | POA: Diagnosis not present

## 2022-05-09 DIAGNOSIS — L89154 Pressure ulcer of sacral region, stage 4: Secondary | ICD-10-CM | POA: Diagnosis not present

## 2022-05-14 DIAGNOSIS — L89154 Pressure ulcer of sacral region, stage 4: Secondary | ICD-10-CM | POA: Diagnosis not present

## 2022-05-21 DIAGNOSIS — L89154 Pressure ulcer of sacral region, stage 4: Secondary | ICD-10-CM | POA: Diagnosis not present

## 2022-05-27 DIAGNOSIS — G5793 Unspecified mononeuropathy of bilateral lower limbs: Secondary | ICD-10-CM | POA: Diagnosis not present

## 2022-05-27 DIAGNOSIS — Z8639 Personal history of other endocrine, nutritional and metabolic disease: Secondary | ICD-10-CM | POA: Diagnosis not present

## 2022-05-27 DIAGNOSIS — Z09 Encounter for follow-up examination after completed treatment for conditions other than malignant neoplasm: Secondary | ICD-10-CM | POA: Diagnosis not present

## 2022-05-27 DIAGNOSIS — E559 Vitamin D deficiency, unspecified: Secondary | ICD-10-CM | POA: Diagnosis not present

## 2022-05-27 DIAGNOSIS — G8220 Paraplegia, unspecified: Secondary | ICD-10-CM | POA: Diagnosis not present

## 2022-05-27 DIAGNOSIS — E039 Hypothyroidism, unspecified: Secondary | ICD-10-CM | POA: Diagnosis not present

## 2022-05-28 DIAGNOSIS — L89154 Pressure ulcer of sacral region, stage 4: Secondary | ICD-10-CM | POA: Diagnosis not present

## 2022-06-04 DIAGNOSIS — L89154 Pressure ulcer of sacral region, stage 4: Secondary | ICD-10-CM | POA: Diagnosis not present

## 2022-06-06 DIAGNOSIS — L89154 Pressure ulcer of sacral region, stage 4: Secondary | ICD-10-CM | POA: Diagnosis not present

## 2022-06-07 DIAGNOSIS — Z7901 Long term (current) use of anticoagulants: Secondary | ICD-10-CM | POA: Diagnosis not present

## 2022-06-07 DIAGNOSIS — E039 Hypothyroidism, unspecified: Secondary | ICD-10-CM | POA: Diagnosis not present

## 2022-06-07 DIAGNOSIS — G8220 Paraplegia, unspecified: Secondary | ICD-10-CM | POA: Diagnosis not present

## 2022-06-07 DIAGNOSIS — I5040 Unspecified combined systolic (congestive) and diastolic (congestive) heart failure: Secondary | ICD-10-CM | POA: Diagnosis not present

## 2022-06-07 DIAGNOSIS — L89154 Pressure ulcer of sacral region, stage 4: Secondary | ICD-10-CM | POA: Diagnosis not present

## 2022-06-07 DIAGNOSIS — Z8719 Personal history of other diseases of the digestive system: Secondary | ICD-10-CM | POA: Diagnosis not present

## 2022-06-07 DIAGNOSIS — D6859 Other primary thrombophilia: Secondary | ICD-10-CM | POA: Diagnosis not present

## 2022-06-07 DIAGNOSIS — Z09 Encounter for follow-up examination after completed treatment for conditions other than malignant neoplasm: Secondary | ICD-10-CM | POA: Diagnosis not present

## 2022-06-07 DIAGNOSIS — D539 Nutritional anemia, unspecified: Secondary | ICD-10-CM | POA: Diagnosis not present

## 2022-06-07 DIAGNOSIS — K449 Diaphragmatic hernia without obstruction or gangrene: Secondary | ICD-10-CM | POA: Diagnosis not present

## 2022-06-07 DIAGNOSIS — G839 Paralytic syndrome, unspecified: Secondary | ICD-10-CM | POA: Diagnosis not present

## 2022-06-11 DIAGNOSIS — L988 Other specified disorders of the skin and subcutaneous tissue: Secondary | ICD-10-CM | POA: Diagnosis not present

## 2022-06-11 DIAGNOSIS — L89154 Pressure ulcer of sacral region, stage 4: Secondary | ICD-10-CM | POA: Diagnosis not present

## 2022-06-18 DIAGNOSIS — L988 Other specified disorders of the skin and subcutaneous tissue: Secondary | ICD-10-CM | POA: Diagnosis not present

## 2022-06-18 DIAGNOSIS — L89154 Pressure ulcer of sacral region, stage 4: Secondary | ICD-10-CM | POA: Diagnosis not present

## 2022-06-19 DIAGNOSIS — M6281 Muscle weakness (generalized): Secondary | ICD-10-CM | POA: Diagnosis not present

## 2022-06-19 DIAGNOSIS — G8194 Hemiplegia, unspecified affecting left nondominant side: Secondary | ICD-10-CM | POA: Diagnosis not present

## 2022-06-20 DIAGNOSIS — G8194 Hemiplegia, unspecified affecting left nondominant side: Secondary | ICD-10-CM | POA: Diagnosis not present

## 2022-06-20 DIAGNOSIS — M6281 Muscle weakness (generalized): Secondary | ICD-10-CM | POA: Diagnosis not present

## 2022-06-21 DIAGNOSIS — G8194 Hemiplegia, unspecified affecting left nondominant side: Secondary | ICD-10-CM | POA: Diagnosis not present

## 2022-06-21 DIAGNOSIS — M6281 Muscle weakness (generalized): Secondary | ICD-10-CM | POA: Diagnosis not present

## 2022-06-24 DIAGNOSIS — M6281 Muscle weakness (generalized): Secondary | ICD-10-CM | POA: Diagnosis not present

## 2022-06-24 DIAGNOSIS — G8194 Hemiplegia, unspecified affecting left nondominant side: Secondary | ICD-10-CM | POA: Diagnosis not present

## 2022-06-25 DIAGNOSIS — G8194 Hemiplegia, unspecified affecting left nondominant side: Secondary | ICD-10-CM | POA: Diagnosis not present

## 2022-06-25 DIAGNOSIS — L89154 Pressure ulcer of sacral region, stage 4: Secondary | ICD-10-CM | POA: Diagnosis not present

## 2022-06-25 DIAGNOSIS — M6281 Muscle weakness (generalized): Secondary | ICD-10-CM | POA: Diagnosis not present

## 2022-06-26 DIAGNOSIS — M6281 Muscle weakness (generalized): Secondary | ICD-10-CM | POA: Diagnosis not present

## 2022-06-26 DIAGNOSIS — G8194 Hemiplegia, unspecified affecting left nondominant side: Secondary | ICD-10-CM | POA: Diagnosis not present

## 2022-06-27 DIAGNOSIS — M6281 Muscle weakness (generalized): Secondary | ICD-10-CM | POA: Diagnosis not present

## 2022-06-27 DIAGNOSIS — G8194 Hemiplegia, unspecified affecting left nondominant side: Secondary | ICD-10-CM | POA: Diagnosis not present

## 2022-06-28 DIAGNOSIS — M6281 Muscle weakness (generalized): Secondary | ICD-10-CM | POA: Diagnosis not present

## 2022-06-28 DIAGNOSIS — G8194 Hemiplegia, unspecified affecting left nondominant side: Secondary | ICD-10-CM | POA: Diagnosis not present

## 2022-07-02 DIAGNOSIS — M6281 Muscle weakness (generalized): Secondary | ICD-10-CM | POA: Diagnosis not present

## 2022-07-02 DIAGNOSIS — G8194 Hemiplegia, unspecified affecting left nondominant side: Secondary | ICD-10-CM | POA: Diagnosis not present

## 2022-07-02 DIAGNOSIS — L89154 Pressure ulcer of sacral region, stage 4: Secondary | ICD-10-CM | POA: Diagnosis not present

## 2022-07-03 DIAGNOSIS — G8194 Hemiplegia, unspecified affecting left nondominant side: Secondary | ICD-10-CM | POA: Diagnosis not present

## 2022-07-03 DIAGNOSIS — M6281 Muscle weakness (generalized): Secondary | ICD-10-CM | POA: Diagnosis not present

## 2022-07-04 DIAGNOSIS — M6281 Muscle weakness (generalized): Secondary | ICD-10-CM | POA: Diagnosis not present

## 2022-07-04 DIAGNOSIS — G8194 Hemiplegia, unspecified affecting left nondominant side: Secondary | ICD-10-CM | POA: Diagnosis not present

## 2022-07-05 DIAGNOSIS — L89154 Pressure ulcer of sacral region, stage 4: Secondary | ICD-10-CM | POA: Diagnosis not present

## 2022-07-05 DIAGNOSIS — M6281 Muscle weakness (generalized): Secondary | ICD-10-CM | POA: Diagnosis not present

## 2022-07-05 DIAGNOSIS — G8194 Hemiplegia, unspecified affecting left nondominant side: Secondary | ICD-10-CM | POA: Diagnosis not present

## 2022-07-08 DIAGNOSIS — G8194 Hemiplegia, unspecified affecting left nondominant side: Secondary | ICD-10-CM | POA: Diagnosis not present

## 2022-07-08 DIAGNOSIS — M6281 Muscle weakness (generalized): Secondary | ICD-10-CM | POA: Diagnosis not present

## 2022-07-09 DIAGNOSIS — M6281 Muscle weakness (generalized): Secondary | ICD-10-CM | POA: Diagnosis not present

## 2022-07-09 DIAGNOSIS — L89154 Pressure ulcer of sacral region, stage 4: Secondary | ICD-10-CM | POA: Diagnosis not present

## 2022-07-09 DIAGNOSIS — G8194 Hemiplegia, unspecified affecting left nondominant side: Secondary | ICD-10-CM | POA: Diagnosis not present

## 2022-07-10 DIAGNOSIS — M6281 Muscle weakness (generalized): Secondary | ICD-10-CM | POA: Diagnosis not present

## 2022-07-10 DIAGNOSIS — G8194 Hemiplegia, unspecified affecting left nondominant side: Secondary | ICD-10-CM | POA: Diagnosis not present

## 2022-07-11 DIAGNOSIS — G8194 Hemiplegia, unspecified affecting left nondominant side: Secondary | ICD-10-CM | POA: Diagnosis not present

## 2022-07-11 DIAGNOSIS — M6281 Muscle weakness (generalized): Secondary | ICD-10-CM | POA: Diagnosis not present

## 2022-07-12 DIAGNOSIS — M6281 Muscle weakness (generalized): Secondary | ICD-10-CM | POA: Diagnosis not present

## 2022-07-12 DIAGNOSIS — G8194 Hemiplegia, unspecified affecting left nondominant side: Secondary | ICD-10-CM | POA: Diagnosis not present

## 2022-07-15 DIAGNOSIS — M6281 Muscle weakness (generalized): Secondary | ICD-10-CM | POA: Diagnosis not present

## 2022-07-15 DIAGNOSIS — G8194 Hemiplegia, unspecified affecting left nondominant side: Secondary | ICD-10-CM | POA: Diagnosis not present

## 2022-07-16 DIAGNOSIS — M6281 Muscle weakness (generalized): Secondary | ICD-10-CM | POA: Diagnosis not present

## 2022-07-16 DIAGNOSIS — G8194 Hemiplegia, unspecified affecting left nondominant side: Secondary | ICD-10-CM | POA: Diagnosis not present

## 2022-07-16 DIAGNOSIS — L89154 Pressure ulcer of sacral region, stage 4: Secondary | ICD-10-CM | POA: Diagnosis not present

## 2022-07-17 DIAGNOSIS — G8194 Hemiplegia, unspecified affecting left nondominant side: Secondary | ICD-10-CM | POA: Diagnosis not present

## 2022-07-17 DIAGNOSIS — M6281 Muscle weakness (generalized): Secondary | ICD-10-CM | POA: Diagnosis not present

## 2022-07-19 DIAGNOSIS — M6281 Muscle weakness (generalized): Secondary | ICD-10-CM | POA: Diagnosis not present

## 2022-07-19 DIAGNOSIS — G8194 Hemiplegia, unspecified affecting left nondominant side: Secondary | ICD-10-CM | POA: Diagnosis not present

## 2022-07-23 DIAGNOSIS — L89154 Pressure ulcer of sacral region, stage 4: Secondary | ICD-10-CM | POA: Diagnosis not present

## 2022-07-30 DIAGNOSIS — L89154 Pressure ulcer of sacral region, stage 4: Secondary | ICD-10-CM | POA: Diagnosis not present

## 2022-07-31 DIAGNOSIS — L89154 Pressure ulcer of sacral region, stage 4: Secondary | ICD-10-CM | POA: Diagnosis not present

## 2022-08-06 DIAGNOSIS — L89154 Pressure ulcer of sacral region, stage 4: Secondary | ICD-10-CM | POA: Diagnosis not present

## 2022-08-13 DIAGNOSIS — L89154 Pressure ulcer of sacral region, stage 4: Secondary | ICD-10-CM | POA: Diagnosis not present

## 2022-08-20 DIAGNOSIS — L89154 Pressure ulcer of sacral region, stage 4: Secondary | ICD-10-CM | POA: Diagnosis not present

## 2022-08-21 DIAGNOSIS — M2041 Other hammer toe(s) (acquired), right foot: Secondary | ICD-10-CM | POA: Diagnosis not present

## 2022-08-21 DIAGNOSIS — M2042 Other hammer toe(s) (acquired), left foot: Secondary | ICD-10-CM | POA: Diagnosis not present

## 2022-08-21 DIAGNOSIS — B351 Tinea unguium: Secondary | ICD-10-CM | POA: Diagnosis not present

## 2022-08-21 DIAGNOSIS — L603 Nail dystrophy: Secondary | ICD-10-CM | POA: Diagnosis not present

## 2022-08-21 DIAGNOSIS — I739 Peripheral vascular disease, unspecified: Secondary | ICD-10-CM | POA: Diagnosis not present

## 2022-08-27 DIAGNOSIS — L89154 Pressure ulcer of sacral region, stage 4: Secondary | ICD-10-CM | POA: Diagnosis not present

## 2022-08-28 DIAGNOSIS — G839 Paralytic syndrome, unspecified: Secondary | ICD-10-CM | POA: Diagnosis not present

## 2022-08-28 DIAGNOSIS — G5793 Unspecified mononeuropathy of bilateral lower limbs: Secondary | ICD-10-CM | POA: Diagnosis not present

## 2022-08-28 DIAGNOSIS — I82509 Chronic embolism and thrombosis of unspecified deep veins of unspecified lower extremity: Secondary | ICD-10-CM | POA: Diagnosis not present

## 2022-08-28 DIAGNOSIS — I5040 Unspecified combined systolic (congestive) and diastolic (congestive) heart failure: Secondary | ICD-10-CM | POA: Diagnosis not present

## 2022-08-28 DIAGNOSIS — D6489 Other specified anemias: Secondary | ICD-10-CM | POA: Diagnosis not present

## 2022-08-28 DIAGNOSIS — E039 Hypothyroidism, unspecified: Secondary | ICD-10-CM | POA: Diagnosis not present

## 2022-08-28 DIAGNOSIS — Z7189 Other specified counseling: Secondary | ICD-10-CM | POA: Diagnosis not present

## 2022-08-28 DIAGNOSIS — Z7901 Long term (current) use of anticoagulants: Secondary | ICD-10-CM | POA: Diagnosis not present

## 2022-08-28 DIAGNOSIS — E559 Vitamin D deficiency, unspecified: Secondary | ICD-10-CM | POA: Diagnosis not present

## 2022-08-28 DIAGNOSIS — F32A Depression, unspecified: Secondary | ICD-10-CM | POA: Diagnosis not present

## 2022-08-29 DIAGNOSIS — L89154 Pressure ulcer of sacral region, stage 4: Secondary | ICD-10-CM | POA: Diagnosis not present

## 2022-09-03 DIAGNOSIS — I10 Essential (primary) hypertension: Secondary | ICD-10-CM | POA: Diagnosis not present

## 2022-09-03 DIAGNOSIS — E119 Type 2 diabetes mellitus without complications: Secondary | ICD-10-CM | POA: Diagnosis not present

## 2022-09-03 DIAGNOSIS — L89154 Pressure ulcer of sacral region, stage 4: Secondary | ICD-10-CM | POA: Diagnosis not present

## 2022-09-03 DIAGNOSIS — E559 Vitamin D deficiency, unspecified: Secondary | ICD-10-CM | POA: Diagnosis not present

## 2022-09-04 DIAGNOSIS — E039 Hypothyroidism, unspecified: Secondary | ICD-10-CM | POA: Diagnosis not present

## 2022-09-04 DIAGNOSIS — D638 Anemia in other chronic diseases classified elsewhere: Secondary | ICD-10-CM | POA: Diagnosis not present

## 2022-09-04 DIAGNOSIS — E559 Vitamin D deficiency, unspecified: Secondary | ICD-10-CM | POA: Diagnosis not present

## 2022-09-04 DIAGNOSIS — Z79899 Other long term (current) drug therapy: Secondary | ICD-10-CM | POA: Diagnosis not present

## 2022-09-10 DIAGNOSIS — L89154 Pressure ulcer of sacral region, stage 4: Secondary | ICD-10-CM | POA: Diagnosis not present

## 2022-09-17 DIAGNOSIS — L89154 Pressure ulcer of sacral region, stage 4: Secondary | ICD-10-CM | POA: Diagnosis not present

## 2022-09-18 DIAGNOSIS — G8194 Hemiplegia, unspecified affecting left nondominant side: Secondary | ICD-10-CM | POA: Diagnosis not present

## 2022-09-18 DIAGNOSIS — R293 Abnormal posture: Secondary | ICD-10-CM | POA: Diagnosis not present

## 2022-09-18 DIAGNOSIS — M6281 Muscle weakness (generalized): Secondary | ICD-10-CM | POA: Diagnosis not present

## 2022-09-19 DIAGNOSIS — R293 Abnormal posture: Secondary | ICD-10-CM | POA: Diagnosis not present

## 2022-09-19 DIAGNOSIS — G8194 Hemiplegia, unspecified affecting left nondominant side: Secondary | ICD-10-CM | POA: Diagnosis not present

## 2022-09-19 DIAGNOSIS — M6281 Muscle weakness (generalized): Secondary | ICD-10-CM | POA: Diagnosis not present

## 2022-09-20 DIAGNOSIS — M6281 Muscle weakness (generalized): Secondary | ICD-10-CM | POA: Diagnosis not present

## 2022-09-20 DIAGNOSIS — R293 Abnormal posture: Secondary | ICD-10-CM | POA: Diagnosis not present

## 2022-09-20 DIAGNOSIS — G8194 Hemiplegia, unspecified affecting left nondominant side: Secondary | ICD-10-CM | POA: Diagnosis not present

## 2022-09-23 DIAGNOSIS — R293 Abnormal posture: Secondary | ICD-10-CM | POA: Diagnosis not present

## 2022-09-23 DIAGNOSIS — M6281 Muscle weakness (generalized): Secondary | ICD-10-CM | POA: Diagnosis not present

## 2022-09-23 DIAGNOSIS — G8194 Hemiplegia, unspecified affecting left nondominant side: Secondary | ICD-10-CM | POA: Diagnosis not present

## 2022-09-24 DIAGNOSIS — M6281 Muscle weakness (generalized): Secondary | ICD-10-CM | POA: Diagnosis not present

## 2022-09-24 DIAGNOSIS — R293 Abnormal posture: Secondary | ICD-10-CM | POA: Diagnosis not present

## 2022-09-24 DIAGNOSIS — L89154 Pressure ulcer of sacral region, stage 4: Secondary | ICD-10-CM | POA: Diagnosis not present

## 2022-09-24 DIAGNOSIS — G8194 Hemiplegia, unspecified affecting left nondominant side: Secondary | ICD-10-CM | POA: Diagnosis not present

## 2022-09-25 DIAGNOSIS — R293 Abnormal posture: Secondary | ICD-10-CM | POA: Diagnosis not present

## 2022-09-25 DIAGNOSIS — M6281 Muscle weakness (generalized): Secondary | ICD-10-CM | POA: Diagnosis not present

## 2022-09-25 DIAGNOSIS — G8194 Hemiplegia, unspecified affecting left nondominant side: Secondary | ICD-10-CM | POA: Diagnosis not present

## 2022-09-26 DIAGNOSIS — G8194 Hemiplegia, unspecified affecting left nondominant side: Secondary | ICD-10-CM | POA: Diagnosis not present

## 2022-09-26 DIAGNOSIS — M6281 Muscle weakness (generalized): Secondary | ICD-10-CM | POA: Diagnosis not present

## 2022-09-26 DIAGNOSIS — R293 Abnormal posture: Secondary | ICD-10-CM | POA: Diagnosis not present

## 2022-09-27 DIAGNOSIS — R293 Abnormal posture: Secondary | ICD-10-CM | POA: Diagnosis not present

## 2022-09-27 DIAGNOSIS — G8194 Hemiplegia, unspecified affecting left nondominant side: Secondary | ICD-10-CM | POA: Diagnosis not present

## 2022-09-27 DIAGNOSIS — M6281 Muscle weakness (generalized): Secondary | ICD-10-CM | POA: Diagnosis not present

## 2022-09-27 DIAGNOSIS — L89154 Pressure ulcer of sacral region, stage 4: Secondary | ICD-10-CM | POA: Diagnosis not present

## 2022-10-01 DIAGNOSIS — G8194 Hemiplegia, unspecified affecting left nondominant side: Secondary | ICD-10-CM | POA: Diagnosis not present

## 2022-10-01 DIAGNOSIS — M6281 Muscle weakness (generalized): Secondary | ICD-10-CM | POA: Diagnosis not present

## 2022-10-01 DIAGNOSIS — L89154 Pressure ulcer of sacral region, stage 4: Secondary | ICD-10-CM | POA: Diagnosis not present

## 2022-10-01 DIAGNOSIS — R293 Abnormal posture: Secondary | ICD-10-CM | POA: Diagnosis not present

## 2022-10-02 DIAGNOSIS — R293 Abnormal posture: Secondary | ICD-10-CM | POA: Diagnosis not present

## 2022-10-02 DIAGNOSIS — G8194 Hemiplegia, unspecified affecting left nondominant side: Secondary | ICD-10-CM | POA: Diagnosis not present

## 2022-10-02 DIAGNOSIS — M6281 Muscle weakness (generalized): Secondary | ICD-10-CM | POA: Diagnosis not present

## 2022-10-03 DIAGNOSIS — G8194 Hemiplegia, unspecified affecting left nondominant side: Secondary | ICD-10-CM | POA: Diagnosis not present

## 2022-10-03 DIAGNOSIS — R293 Abnormal posture: Secondary | ICD-10-CM | POA: Diagnosis not present

## 2022-10-03 DIAGNOSIS — M6281 Muscle weakness (generalized): Secondary | ICD-10-CM | POA: Diagnosis not present

## 2022-10-07 DIAGNOSIS — M6281 Muscle weakness (generalized): Secondary | ICD-10-CM | POA: Diagnosis not present

## 2022-10-07 DIAGNOSIS — G8194 Hemiplegia, unspecified affecting left nondominant side: Secondary | ICD-10-CM | POA: Diagnosis not present

## 2022-10-07 DIAGNOSIS — R293 Abnormal posture: Secondary | ICD-10-CM | POA: Diagnosis not present

## 2022-10-08 DIAGNOSIS — G8194 Hemiplegia, unspecified affecting left nondominant side: Secondary | ICD-10-CM | POA: Diagnosis not present

## 2022-10-08 DIAGNOSIS — R293 Abnormal posture: Secondary | ICD-10-CM | POA: Diagnosis not present

## 2022-10-08 DIAGNOSIS — L89154 Pressure ulcer of sacral region, stage 4: Secondary | ICD-10-CM | POA: Diagnosis not present

## 2022-10-08 DIAGNOSIS — M6281 Muscle weakness (generalized): Secondary | ICD-10-CM | POA: Diagnosis not present

## 2022-10-09 DIAGNOSIS — I5040 Unspecified combined systolic (congestive) and diastolic (congestive) heart failure: Secondary | ICD-10-CM | POA: Diagnosis not present

## 2022-10-09 DIAGNOSIS — D6859 Other primary thrombophilia: Secondary | ICD-10-CM | POA: Diagnosis not present

## 2022-10-09 DIAGNOSIS — G8220 Paraplegia, unspecified: Secondary | ICD-10-CM | POA: Diagnosis not present

## 2022-10-09 DIAGNOSIS — L89154 Pressure ulcer of sacral region, stage 4: Secondary | ICD-10-CM | POA: Diagnosis not present

## 2022-10-09 DIAGNOSIS — Z7901 Long term (current) use of anticoagulants: Secondary | ICD-10-CM | POA: Diagnosis not present

## 2022-10-09 DIAGNOSIS — Z8719 Personal history of other diseases of the digestive system: Secondary | ICD-10-CM | POA: Diagnosis not present

## 2022-10-09 DIAGNOSIS — I110 Hypertensive heart disease with heart failure: Secondary | ICD-10-CM | POA: Diagnosis not present

## 2022-10-09 DIAGNOSIS — G8194 Hemiplegia, unspecified affecting left nondominant side: Secondary | ICD-10-CM | POA: Diagnosis not present

## 2022-10-09 DIAGNOSIS — D539 Nutritional anemia, unspecified: Secondary | ICD-10-CM | POA: Diagnosis not present

## 2022-10-09 DIAGNOSIS — R293 Abnormal posture: Secondary | ICD-10-CM | POA: Diagnosis not present

## 2022-10-09 DIAGNOSIS — E039 Hypothyroidism, unspecified: Secondary | ICD-10-CM | POA: Diagnosis not present

## 2022-10-09 DIAGNOSIS — K449 Diaphragmatic hernia without obstruction or gangrene: Secondary | ICD-10-CM | POA: Diagnosis not present

## 2022-10-09 DIAGNOSIS — M6281 Muscle weakness (generalized): Secondary | ICD-10-CM | POA: Diagnosis not present

## 2022-10-09 DIAGNOSIS — G839 Paralytic syndrome, unspecified: Secondary | ICD-10-CM | POA: Diagnosis not present

## 2022-10-14 DIAGNOSIS — R293 Abnormal posture: Secondary | ICD-10-CM | POA: Diagnosis not present

## 2022-10-14 DIAGNOSIS — G8194 Hemiplegia, unspecified affecting left nondominant side: Secondary | ICD-10-CM | POA: Diagnosis not present

## 2022-10-14 DIAGNOSIS — M6281 Muscle weakness (generalized): Secondary | ICD-10-CM | POA: Diagnosis not present

## 2022-10-15 DIAGNOSIS — M6281 Muscle weakness (generalized): Secondary | ICD-10-CM | POA: Diagnosis not present

## 2022-10-15 DIAGNOSIS — G8194 Hemiplegia, unspecified affecting left nondominant side: Secondary | ICD-10-CM | POA: Diagnosis not present

## 2022-10-15 DIAGNOSIS — R293 Abnormal posture: Secondary | ICD-10-CM | POA: Diagnosis not present

## 2022-10-15 DIAGNOSIS — L89154 Pressure ulcer of sacral region, stage 4: Secondary | ICD-10-CM | POA: Diagnosis not present

## 2022-10-16 DIAGNOSIS — M6281 Muscle weakness (generalized): Secondary | ICD-10-CM | POA: Diagnosis not present

## 2022-10-16 DIAGNOSIS — G8194 Hemiplegia, unspecified affecting left nondominant side: Secondary | ICD-10-CM | POA: Diagnosis not present

## 2022-10-16 DIAGNOSIS — R293 Abnormal posture: Secondary | ICD-10-CM | POA: Diagnosis not present

## 2022-10-18 DIAGNOSIS — R293 Abnormal posture: Secondary | ICD-10-CM | POA: Diagnosis not present

## 2022-10-18 DIAGNOSIS — G8194 Hemiplegia, unspecified affecting left nondominant side: Secondary | ICD-10-CM | POA: Diagnosis not present

## 2022-10-18 DIAGNOSIS — M6281 Muscle weakness (generalized): Secondary | ICD-10-CM | POA: Diagnosis not present

## 2022-10-22 DIAGNOSIS — L89154 Pressure ulcer of sacral region, stage 4: Secondary | ICD-10-CM | POA: Diagnosis not present

## 2022-10-24 DIAGNOSIS — L89154 Pressure ulcer of sacral region, stage 4: Secondary | ICD-10-CM | POA: Diagnosis not present

## 2022-10-28 DIAGNOSIS — Z8719 Personal history of other diseases of the digestive system: Secondary | ICD-10-CM | POA: Diagnosis not present

## 2022-10-28 DIAGNOSIS — K449 Diaphragmatic hernia without obstruction or gangrene: Secondary | ICD-10-CM | POA: Diagnosis not present

## 2022-10-28 DIAGNOSIS — G8220 Paraplegia, unspecified: Secondary | ICD-10-CM | POA: Diagnosis not present

## 2022-10-29 DIAGNOSIS — L89154 Pressure ulcer of sacral region, stage 4: Secondary | ICD-10-CM | POA: Diagnosis not present

## 2022-11-04 DIAGNOSIS — E559 Vitamin D deficiency, unspecified: Secondary | ICD-10-CM | POA: Diagnosis not present

## 2022-11-05 DIAGNOSIS — L89154 Pressure ulcer of sacral region, stage 4: Secondary | ICD-10-CM | POA: Diagnosis not present

## 2022-11-12 DIAGNOSIS — L89154 Pressure ulcer of sacral region, stage 4: Secondary | ICD-10-CM | POA: Diagnosis not present

## 2022-11-19 DIAGNOSIS — L89154 Pressure ulcer of sacral region, stage 4: Secondary | ICD-10-CM | POA: Diagnosis not present

## 2022-11-26 DIAGNOSIS — L89154 Pressure ulcer of sacral region, stage 4: Secondary | ICD-10-CM | POA: Diagnosis not present

## 2022-11-27 DIAGNOSIS — L89154 Pressure ulcer of sacral region, stage 4: Secondary | ICD-10-CM | POA: Diagnosis not present

## 2022-12-31 DIAGNOSIS — L89154 Pressure ulcer of sacral region, stage 4: Secondary | ICD-10-CM | POA: Diagnosis not present

## 2023-01-07 DIAGNOSIS — L89154 Pressure ulcer of sacral region, stage 4: Secondary | ICD-10-CM | POA: Diagnosis not present

## 2023-01-14 DIAGNOSIS — G8220 Paraplegia, unspecified: Secondary | ICD-10-CM | POA: Diagnosis not present

## 2023-01-14 DIAGNOSIS — L89154 Pressure ulcer of sacral region, stage 4: Secondary | ICD-10-CM | POA: Diagnosis not present

## 2023-01-14 DIAGNOSIS — E119 Type 2 diabetes mellitus without complications: Secondary | ICD-10-CM | POA: Diagnosis not present

## 2023-01-14 DIAGNOSIS — I10 Essential (primary) hypertension: Secondary | ICD-10-CM | POA: Diagnosis not present

## 2023-01-14 DIAGNOSIS — E785 Hyperlipidemia, unspecified: Secondary | ICD-10-CM | POA: Diagnosis not present

## 2023-01-14 DIAGNOSIS — E039 Hypothyroidism, unspecified: Secondary | ICD-10-CM | POA: Diagnosis not present

## 2023-01-21 DIAGNOSIS — L89154 Pressure ulcer of sacral region, stage 4: Secondary | ICD-10-CM | POA: Diagnosis not present

## 2023-01-28 DIAGNOSIS — L89154 Pressure ulcer of sacral region, stage 4: Secondary | ICD-10-CM | POA: Diagnosis not present

## 2023-02-03 DIAGNOSIS — I5040 Unspecified combined systolic (congestive) and diastolic (congestive) heart failure: Secondary | ICD-10-CM | POA: Diagnosis not present

## 2023-02-03 DIAGNOSIS — K219 Gastro-esophageal reflux disease without esophagitis: Secondary | ICD-10-CM | POA: Diagnosis not present

## 2023-02-03 DIAGNOSIS — G894 Chronic pain syndrome: Secondary | ICD-10-CM | POA: Diagnosis not present

## 2023-02-03 DIAGNOSIS — G839 Paralytic syndrome, unspecified: Secondary | ICD-10-CM | POA: Diagnosis not present

## 2023-02-03 DIAGNOSIS — G5793 Unspecified mononeuropathy of bilateral lower limbs: Secondary | ICD-10-CM | POA: Diagnosis not present

## 2023-02-03 DIAGNOSIS — E559 Vitamin D deficiency, unspecified: Secondary | ICD-10-CM | POA: Diagnosis not present

## 2023-02-03 DIAGNOSIS — G96198 Other disorders of meninges, not elsewhere classified: Secondary | ICD-10-CM | POA: Diagnosis not present

## 2023-02-03 DIAGNOSIS — M4714 Other spondylosis with myelopathy, thoracic region: Secondary | ICD-10-CM | POA: Diagnosis not present

## 2023-02-03 DIAGNOSIS — I82509 Chronic embolism and thrombosis of unspecified deep veins of unspecified lower extremity: Secondary | ICD-10-CM | POA: Diagnosis not present

## 2023-02-03 DIAGNOSIS — H04129 Dry eye syndrome of unspecified lacrimal gland: Secondary | ICD-10-CM | POA: Diagnosis not present

## 2023-02-03 DIAGNOSIS — Z7901 Long term (current) use of anticoagulants: Secondary | ICD-10-CM | POA: Diagnosis not present

## 2023-02-03 DIAGNOSIS — J309 Allergic rhinitis, unspecified: Secondary | ICD-10-CM | POA: Diagnosis not present

## 2023-02-03 DIAGNOSIS — E119 Type 2 diabetes mellitus without complications: Secondary | ICD-10-CM | POA: Diagnosis not present

## 2023-02-03 DIAGNOSIS — I110 Hypertensive heart disease with heart failure: Secondary | ICD-10-CM | POA: Diagnosis not present

## 2023-02-03 DIAGNOSIS — L89154 Pressure ulcer of sacral region, stage 4: Secondary | ICD-10-CM | POA: Diagnosis not present

## 2023-02-03 DIAGNOSIS — Z8719 Personal history of other diseases of the digestive system: Secondary | ICD-10-CM | POA: Diagnosis not present

## 2023-02-03 DIAGNOSIS — E785 Hyperlipidemia, unspecified: Secondary | ICD-10-CM | POA: Diagnosis not present

## 2023-02-03 DIAGNOSIS — E039 Hypothyroidism, unspecified: Secondary | ICD-10-CM | POA: Diagnosis not present

## 2023-02-03 DIAGNOSIS — G8220 Paraplegia, unspecified: Secondary | ICD-10-CM | POA: Diagnosis not present

## 2023-02-03 DIAGNOSIS — D649 Anemia, unspecified: Secondary | ICD-10-CM | POA: Diagnosis not present

## 2023-02-03 DIAGNOSIS — K592 Neurogenic bowel, not elsewhere classified: Secondary | ICD-10-CM | POA: Diagnosis not present

## 2023-02-04 DIAGNOSIS — L89154 Pressure ulcer of sacral region, stage 4: Secondary | ICD-10-CM | POA: Diagnosis not present

## 2023-02-11 DIAGNOSIS — L89154 Pressure ulcer of sacral region, stage 4: Secondary | ICD-10-CM | POA: Diagnosis not present

## 2023-02-18 DIAGNOSIS — L89154 Pressure ulcer of sacral region, stage 4: Secondary | ICD-10-CM | POA: Diagnosis not present

## 2023-02-19 DIAGNOSIS — D649 Anemia, unspecified: Secondary | ICD-10-CM | POA: Diagnosis not present

## 2023-02-19 DIAGNOSIS — I10 Essential (primary) hypertension: Secondary | ICD-10-CM | POA: Diagnosis not present

## 2023-02-19 DIAGNOSIS — E876 Hypokalemia: Secondary | ICD-10-CM | POA: Diagnosis not present

## 2023-02-25 DIAGNOSIS — L89154 Pressure ulcer of sacral region, stage 4: Secondary | ICD-10-CM | POA: Diagnosis not present

## 2023-02-26 DIAGNOSIS — H401134 Primary open-angle glaucoma, bilateral, indeterminate stage: Secondary | ICD-10-CM | POA: Diagnosis not present

## 2023-02-26 DIAGNOSIS — H2513 Age-related nuclear cataract, bilateral: Secondary | ICD-10-CM | POA: Diagnosis not present

## 2023-02-26 DIAGNOSIS — L89154 Pressure ulcer of sacral region, stage 4: Secondary | ICD-10-CM | POA: Diagnosis not present

## 2023-03-04 DIAGNOSIS — L89154 Pressure ulcer of sacral region, stage 4: Secondary | ICD-10-CM | POA: Diagnosis not present

## 2023-03-11 DIAGNOSIS — M6281 Muscle weakness (generalized): Secondary | ICD-10-CM | POA: Diagnosis not present

## 2023-03-11 DIAGNOSIS — L89154 Pressure ulcer of sacral region, stage 4: Secondary | ICD-10-CM | POA: Diagnosis not present

## 2023-03-12 DIAGNOSIS — M6281 Muscle weakness (generalized): Secondary | ICD-10-CM | POA: Diagnosis not present

## 2023-03-13 DIAGNOSIS — M6281 Muscle weakness (generalized): Secondary | ICD-10-CM | POA: Diagnosis not present

## 2023-03-15 DIAGNOSIS — M6281 Muscle weakness (generalized): Secondary | ICD-10-CM | POA: Diagnosis not present

## 2023-03-17 DIAGNOSIS — M6281 Muscle weakness (generalized): Secondary | ICD-10-CM | POA: Diagnosis not present

## 2023-03-18 DIAGNOSIS — M6281 Muscle weakness (generalized): Secondary | ICD-10-CM | POA: Diagnosis not present

## 2023-03-18 DIAGNOSIS — L89154 Pressure ulcer of sacral region, stage 4: Secondary | ICD-10-CM | POA: Diagnosis not present

## 2023-03-19 DIAGNOSIS — M6281 Muscle weakness (generalized): Secondary | ICD-10-CM | POA: Diagnosis not present

## 2023-03-20 DIAGNOSIS — M6281 Muscle weakness (generalized): Secondary | ICD-10-CM | POA: Diagnosis not present

## 2023-03-24 DIAGNOSIS — M6281 Muscle weakness (generalized): Secondary | ICD-10-CM | POA: Diagnosis not present

## 2023-03-25 DIAGNOSIS — L89154 Pressure ulcer of sacral region, stage 4: Secondary | ICD-10-CM | POA: Diagnosis not present

## 2023-03-25 DIAGNOSIS — M6281 Muscle weakness (generalized): Secondary | ICD-10-CM | POA: Diagnosis not present

## 2023-03-26 DIAGNOSIS — M6281 Muscle weakness (generalized): Secondary | ICD-10-CM | POA: Diagnosis not present

## 2023-03-31 DIAGNOSIS — M6281 Muscle weakness (generalized): Secondary | ICD-10-CM | POA: Diagnosis not present

## 2023-03-31 DIAGNOSIS — H4010X0 Unspecified open-angle glaucoma, stage unspecified: Secondary | ICD-10-CM | POA: Diagnosis not present

## 2023-03-31 DIAGNOSIS — H2513 Age-related nuclear cataract, bilateral: Secondary | ICD-10-CM | POA: Diagnosis not present

## 2023-03-31 DIAGNOSIS — E46 Unspecified protein-calorie malnutrition: Secondary | ICD-10-CM | POA: Diagnosis not present

## 2023-04-01 DIAGNOSIS — L89154 Pressure ulcer of sacral region, stage 4: Secondary | ICD-10-CM | POA: Diagnosis not present

## 2023-04-02 DIAGNOSIS — M6281 Muscle weakness (generalized): Secondary | ICD-10-CM | POA: Diagnosis not present

## 2023-04-03 DIAGNOSIS — I509 Heart failure, unspecified: Secondary | ICD-10-CM | POA: Diagnosis not present

## 2023-04-03 DIAGNOSIS — D539 Nutritional anemia, unspecified: Secondary | ICD-10-CM | POA: Diagnosis not present

## 2023-04-03 DIAGNOSIS — G894 Chronic pain syndrome: Secondary | ICD-10-CM | POA: Diagnosis not present

## 2023-04-03 DIAGNOSIS — D649 Anemia, unspecified: Secondary | ICD-10-CM | POA: Diagnosis not present

## 2023-04-03 DIAGNOSIS — E785 Hyperlipidemia, unspecified: Secondary | ICD-10-CM | POA: Diagnosis not present

## 2023-04-03 DIAGNOSIS — G5793 Unspecified mononeuropathy of bilateral lower limbs: Secondary | ICD-10-CM | POA: Diagnosis not present

## 2023-04-03 DIAGNOSIS — E039 Hypothyroidism, unspecified: Secondary | ICD-10-CM | POA: Diagnosis not present

## 2023-04-03 DIAGNOSIS — E119 Type 2 diabetes mellitus without complications: Secondary | ICD-10-CM | POA: Diagnosis not present

## 2023-04-03 DIAGNOSIS — E559 Vitamin D deficiency, unspecified: Secondary | ICD-10-CM | POA: Diagnosis not present

## 2023-04-03 DIAGNOSIS — K592 Neurogenic bowel, not elsewhere classified: Secondary | ICD-10-CM | POA: Diagnosis not present

## 2023-04-03 DIAGNOSIS — K219 Gastro-esophageal reflux disease without esophagitis: Secondary | ICD-10-CM | POA: Diagnosis not present

## 2023-04-03 DIAGNOSIS — I110 Hypertensive heart disease with heart failure: Secondary | ICD-10-CM | POA: Diagnosis not present

## 2023-04-04 DIAGNOSIS — E559 Vitamin D deficiency, unspecified: Secondary | ICD-10-CM | POA: Diagnosis not present

## 2023-04-04 DIAGNOSIS — E119 Type 2 diabetes mellitus without complications: Secondary | ICD-10-CM | POA: Diagnosis not present

## 2023-04-08 DIAGNOSIS — G5793 Unspecified mononeuropathy of bilateral lower limbs: Secondary | ICD-10-CM | POA: Diagnosis not present

## 2023-04-08 DIAGNOSIS — L89154 Pressure ulcer of sacral region, stage 4: Secondary | ICD-10-CM | POA: Diagnosis not present

## 2023-04-08 DIAGNOSIS — E785 Hyperlipidemia, unspecified: Secondary | ICD-10-CM | POA: Diagnosis not present

## 2023-04-08 DIAGNOSIS — E039 Hypothyroidism, unspecified: Secondary | ICD-10-CM | POA: Diagnosis not present

## 2023-04-08 DIAGNOSIS — E119 Type 2 diabetes mellitus without complications: Secondary | ICD-10-CM | POA: Diagnosis not present

## 2023-04-08 DIAGNOSIS — I509 Heart failure, unspecified: Secondary | ICD-10-CM | POA: Diagnosis not present

## 2023-04-08 DIAGNOSIS — D649 Anemia, unspecified: Secondary | ICD-10-CM | POA: Diagnosis not present

## 2023-04-08 DIAGNOSIS — G894 Chronic pain syndrome: Secondary | ICD-10-CM | POA: Diagnosis not present

## 2023-04-08 DIAGNOSIS — M6281 Muscle weakness (generalized): Secondary | ICD-10-CM | POA: Diagnosis not present

## 2023-04-09 DIAGNOSIS — M6281 Muscle weakness (generalized): Secondary | ICD-10-CM | POA: Diagnosis not present

## 2023-04-11 DIAGNOSIS — M6281 Muscle weakness (generalized): Secondary | ICD-10-CM | POA: Diagnosis not present

## 2023-04-15 DIAGNOSIS — L89154 Pressure ulcer of sacral region, stage 4: Secondary | ICD-10-CM | POA: Diagnosis not present

## 2023-04-17 DIAGNOSIS — G894 Chronic pain syndrome: Secondary | ICD-10-CM | POA: Diagnosis not present

## 2023-04-18 DIAGNOSIS — L89154 Pressure ulcer of sacral region, stage 4: Secondary | ICD-10-CM | POA: Diagnosis not present

## 2023-04-22 DIAGNOSIS — H47233 Glaucomatous optic atrophy, bilateral: Secondary | ICD-10-CM | POA: Diagnosis not present

## 2023-04-22 DIAGNOSIS — L89154 Pressure ulcer of sacral region, stage 4: Secondary | ICD-10-CM | POA: Diagnosis not present

## 2023-04-28 DIAGNOSIS — M79604 Pain in right leg: Secondary | ICD-10-CM | POA: Diagnosis not present

## 2023-04-28 DIAGNOSIS — G894 Chronic pain syndrome: Secondary | ICD-10-CM | POA: Diagnosis not present

## 2023-04-28 DIAGNOSIS — M79605 Pain in left leg: Secondary | ICD-10-CM | POA: Diagnosis not present

## 2023-04-28 DIAGNOSIS — M549 Dorsalgia, unspecified: Secondary | ICD-10-CM | POA: Diagnosis not present

## 2023-04-29 DIAGNOSIS — L89154 Pressure ulcer of sacral region, stage 4: Secondary | ICD-10-CM | POA: Diagnosis not present

## 2023-05-06 DIAGNOSIS — M79604 Pain in right leg: Secondary | ICD-10-CM | POA: Diagnosis not present

## 2023-05-06 DIAGNOSIS — G5793 Unspecified mononeuropathy of bilateral lower limbs: Secondary | ICD-10-CM | POA: Diagnosis not present

## 2023-05-06 DIAGNOSIS — G894 Chronic pain syndrome: Secondary | ICD-10-CM | POA: Diagnosis not present

## 2023-05-06 DIAGNOSIS — L89154 Pressure ulcer of sacral region, stage 4: Secondary | ICD-10-CM | POA: Diagnosis not present

## 2023-05-06 DIAGNOSIS — M549 Dorsalgia, unspecified: Secondary | ICD-10-CM | POA: Diagnosis not present

## 2023-05-06 DIAGNOSIS — M79605 Pain in left leg: Secondary | ICD-10-CM | POA: Diagnosis not present

## 2023-05-12 DIAGNOSIS — L89154 Pressure ulcer of sacral region, stage 4: Secondary | ICD-10-CM | POA: Diagnosis not present

## 2023-05-12 DIAGNOSIS — E46 Unspecified protein-calorie malnutrition: Secondary | ICD-10-CM | POA: Diagnosis not present

## 2023-05-12 DIAGNOSIS — G839 Paralytic syndrome, unspecified: Secondary | ICD-10-CM | POA: Diagnosis not present

## 2023-05-12 DIAGNOSIS — G5793 Unspecified mononeuropathy of bilateral lower limbs: Secondary | ICD-10-CM | POA: Diagnosis not present

## 2023-05-13 DIAGNOSIS — L89154 Pressure ulcer of sacral region, stage 4: Secondary | ICD-10-CM | POA: Diagnosis not present

## 2023-05-20 DIAGNOSIS — L89154 Pressure ulcer of sacral region, stage 4: Secondary | ICD-10-CM | POA: Diagnosis not present

## 2023-05-22 DIAGNOSIS — Z4801 Encounter for change or removal of surgical wound dressing: Secondary | ICD-10-CM | POA: Diagnosis not present

## 2023-05-22 DIAGNOSIS — G5793 Unspecified mononeuropathy of bilateral lower limbs: Secondary | ICD-10-CM | POA: Diagnosis not present

## 2023-05-22 DIAGNOSIS — L89154 Pressure ulcer of sacral region, stage 4: Secondary | ICD-10-CM | POA: Diagnosis not present

## 2023-05-22 DIAGNOSIS — G839 Paralytic syndrome, unspecified: Secondary | ICD-10-CM | POA: Diagnosis not present

## 2023-05-27 DIAGNOSIS — L89154 Pressure ulcer of sacral region, stage 4: Secondary | ICD-10-CM | POA: Diagnosis not present

## 2023-06-03 DIAGNOSIS — L89154 Pressure ulcer of sacral region, stage 4: Secondary | ICD-10-CM | POA: Diagnosis not present

## 2023-06-05 DIAGNOSIS — I110 Hypertensive heart disease with heart failure: Secondary | ICD-10-CM | POA: Diagnosis not present

## 2023-06-05 DIAGNOSIS — E039 Hypothyroidism, unspecified: Secondary | ICD-10-CM | POA: Diagnosis not present

## 2023-06-05 DIAGNOSIS — E785 Hyperlipidemia, unspecified: Secondary | ICD-10-CM | POA: Diagnosis not present

## 2023-06-05 DIAGNOSIS — D649 Anemia, unspecified: Secondary | ICD-10-CM | POA: Diagnosis not present

## 2023-06-05 DIAGNOSIS — M6281 Muscle weakness (generalized): Secondary | ICD-10-CM | POA: Diagnosis not present

## 2023-06-05 DIAGNOSIS — K592 Neurogenic bowel, not elsewhere classified: Secondary | ICD-10-CM | POA: Diagnosis not present

## 2023-06-05 DIAGNOSIS — G839 Paralytic syndrome, unspecified: Secondary | ICD-10-CM | POA: Diagnosis not present

## 2023-06-05 DIAGNOSIS — L89154 Pressure ulcer of sacral region, stage 4: Secondary | ICD-10-CM | POA: Diagnosis not present

## 2023-06-05 DIAGNOSIS — R293 Abnormal posture: Secondary | ICD-10-CM | POA: Diagnosis not present

## 2023-06-05 DIAGNOSIS — G5793 Unspecified mononeuropathy of bilateral lower limbs: Secondary | ICD-10-CM | POA: Diagnosis not present

## 2023-06-05 DIAGNOSIS — I5040 Unspecified combined systolic (congestive) and diastolic (congestive) heart failure: Secondary | ICD-10-CM | POA: Diagnosis not present

## 2023-06-05 DIAGNOSIS — G894 Chronic pain syndrome: Secondary | ICD-10-CM | POA: Diagnosis not present

## 2023-06-05 DIAGNOSIS — G8194 Hemiplegia, unspecified affecting left nondominant side: Secondary | ICD-10-CM | POA: Diagnosis not present

## 2023-06-05 DIAGNOSIS — E119 Type 2 diabetes mellitus without complications: Secondary | ICD-10-CM | POA: Diagnosis not present

## 2023-06-05 DIAGNOSIS — R279 Unspecified lack of coordination: Secondary | ICD-10-CM | POA: Diagnosis not present

## 2023-06-05 DIAGNOSIS — K219 Gastro-esophageal reflux disease without esophagitis: Secondary | ICD-10-CM | POA: Diagnosis not present

## 2023-06-07 DIAGNOSIS — R279 Unspecified lack of coordination: Secondary | ICD-10-CM | POA: Diagnosis not present

## 2023-06-07 DIAGNOSIS — M6281 Muscle weakness (generalized): Secondary | ICD-10-CM | POA: Diagnosis not present

## 2023-06-07 DIAGNOSIS — G8194 Hemiplegia, unspecified affecting left nondominant side: Secondary | ICD-10-CM | POA: Diagnosis not present

## 2023-06-07 DIAGNOSIS — R293 Abnormal posture: Secondary | ICD-10-CM | POA: Diagnosis not present

## 2023-06-09 DIAGNOSIS — M6281 Muscle weakness (generalized): Secondary | ICD-10-CM | POA: Diagnosis not present

## 2023-06-09 DIAGNOSIS — R279 Unspecified lack of coordination: Secondary | ICD-10-CM | POA: Diagnosis not present

## 2023-06-09 DIAGNOSIS — R293 Abnormal posture: Secondary | ICD-10-CM | POA: Diagnosis not present

## 2023-06-09 DIAGNOSIS — G8194 Hemiplegia, unspecified affecting left nondominant side: Secondary | ICD-10-CM | POA: Diagnosis not present

## 2023-06-10 DIAGNOSIS — R293 Abnormal posture: Secondary | ICD-10-CM | POA: Diagnosis not present

## 2023-06-10 DIAGNOSIS — M6281 Muscle weakness (generalized): Secondary | ICD-10-CM | POA: Diagnosis not present

## 2023-06-10 DIAGNOSIS — L89154 Pressure ulcer of sacral region, stage 4: Secondary | ICD-10-CM | POA: Diagnosis not present

## 2023-06-10 DIAGNOSIS — R279 Unspecified lack of coordination: Secondary | ICD-10-CM | POA: Diagnosis not present

## 2023-06-10 DIAGNOSIS — G8194 Hemiplegia, unspecified affecting left nondominant side: Secondary | ICD-10-CM | POA: Diagnosis not present

## 2023-06-11 DIAGNOSIS — R293 Abnormal posture: Secondary | ICD-10-CM | POA: Diagnosis not present

## 2023-06-11 DIAGNOSIS — M6281 Muscle weakness (generalized): Secondary | ICD-10-CM | POA: Diagnosis not present

## 2023-06-11 DIAGNOSIS — R279 Unspecified lack of coordination: Secondary | ICD-10-CM | POA: Diagnosis not present

## 2023-06-11 DIAGNOSIS — G8194 Hemiplegia, unspecified affecting left nondominant side: Secondary | ICD-10-CM | POA: Diagnosis not present

## 2023-06-12 DIAGNOSIS — R279 Unspecified lack of coordination: Secondary | ICD-10-CM | POA: Diagnosis not present

## 2023-06-12 DIAGNOSIS — G8194 Hemiplegia, unspecified affecting left nondominant side: Secondary | ICD-10-CM | POA: Diagnosis not present

## 2023-06-12 DIAGNOSIS — R293 Abnormal posture: Secondary | ICD-10-CM | POA: Diagnosis not present

## 2023-06-12 DIAGNOSIS — M6281 Muscle weakness (generalized): Secondary | ICD-10-CM | POA: Diagnosis not present

## 2023-06-13 DIAGNOSIS — G8194 Hemiplegia, unspecified affecting left nondominant side: Secondary | ICD-10-CM | POA: Diagnosis not present

## 2023-06-13 DIAGNOSIS — M6281 Muscle weakness (generalized): Secondary | ICD-10-CM | POA: Diagnosis not present

## 2023-06-13 DIAGNOSIS — R293 Abnormal posture: Secondary | ICD-10-CM | POA: Diagnosis not present

## 2023-06-13 DIAGNOSIS — R279 Unspecified lack of coordination: Secondary | ICD-10-CM | POA: Diagnosis not present

## 2023-06-16 DIAGNOSIS — M6281 Muscle weakness (generalized): Secondary | ICD-10-CM | POA: Diagnosis not present

## 2023-06-16 DIAGNOSIS — G8194 Hemiplegia, unspecified affecting left nondominant side: Secondary | ICD-10-CM | POA: Diagnosis not present

## 2023-06-16 DIAGNOSIS — R293 Abnormal posture: Secondary | ICD-10-CM | POA: Diagnosis not present

## 2023-06-16 DIAGNOSIS — R279 Unspecified lack of coordination: Secondary | ICD-10-CM | POA: Diagnosis not present

## 2023-06-17 DIAGNOSIS — L89154 Pressure ulcer of sacral region, stage 4: Secondary | ICD-10-CM | POA: Diagnosis not present

## 2023-06-17 DIAGNOSIS — R279 Unspecified lack of coordination: Secondary | ICD-10-CM | POA: Diagnosis not present

## 2023-06-17 DIAGNOSIS — M6281 Muscle weakness (generalized): Secondary | ICD-10-CM | POA: Diagnosis not present

## 2023-06-17 DIAGNOSIS — R293 Abnormal posture: Secondary | ICD-10-CM | POA: Diagnosis not present

## 2023-06-17 DIAGNOSIS — G8194 Hemiplegia, unspecified affecting left nondominant side: Secondary | ICD-10-CM | POA: Diagnosis not present

## 2023-06-18 DIAGNOSIS — M6281 Muscle weakness (generalized): Secondary | ICD-10-CM | POA: Diagnosis not present

## 2023-06-18 DIAGNOSIS — R279 Unspecified lack of coordination: Secondary | ICD-10-CM | POA: Diagnosis not present

## 2023-06-18 DIAGNOSIS — G8194 Hemiplegia, unspecified affecting left nondominant side: Secondary | ICD-10-CM | POA: Diagnosis not present

## 2023-06-18 DIAGNOSIS — R293 Abnormal posture: Secondary | ICD-10-CM | POA: Diagnosis not present

## 2023-06-19 DIAGNOSIS — R279 Unspecified lack of coordination: Secondary | ICD-10-CM | POA: Diagnosis not present

## 2023-06-19 DIAGNOSIS — G8194 Hemiplegia, unspecified affecting left nondominant side: Secondary | ICD-10-CM | POA: Diagnosis not present

## 2023-06-19 DIAGNOSIS — R293 Abnormal posture: Secondary | ICD-10-CM | POA: Diagnosis not present

## 2023-06-19 DIAGNOSIS — M6281 Muscle weakness (generalized): Secondary | ICD-10-CM | POA: Diagnosis not present

## 2023-06-23 DIAGNOSIS — L89154 Pressure ulcer of sacral region, stage 4: Secondary | ICD-10-CM | POA: Diagnosis not present

## 2023-06-24 DIAGNOSIS — L89154 Pressure ulcer of sacral region, stage 4: Secondary | ICD-10-CM | POA: Diagnosis not present

## 2023-06-25 DIAGNOSIS — R279 Unspecified lack of coordination: Secondary | ICD-10-CM | POA: Diagnosis not present

## 2023-06-25 DIAGNOSIS — G8194 Hemiplegia, unspecified affecting left nondominant side: Secondary | ICD-10-CM | POA: Diagnosis not present

## 2023-06-25 DIAGNOSIS — R293 Abnormal posture: Secondary | ICD-10-CM | POA: Diagnosis not present

## 2023-06-25 DIAGNOSIS — M6281 Muscle weakness (generalized): Secondary | ICD-10-CM | POA: Diagnosis not present

## 2023-06-26 DIAGNOSIS — D649 Anemia, unspecified: Secondary | ICD-10-CM | POA: Diagnosis not present

## 2023-06-26 DIAGNOSIS — E785 Hyperlipidemia, unspecified: Secondary | ICD-10-CM | POA: Diagnosis not present

## 2023-06-26 DIAGNOSIS — G5793 Unspecified mononeuropathy of bilateral lower limbs: Secondary | ICD-10-CM | POA: Diagnosis not present

## 2023-06-26 DIAGNOSIS — Z9049 Acquired absence of other specified parts of digestive tract: Secondary | ICD-10-CM | POA: Diagnosis not present

## 2023-06-26 DIAGNOSIS — E039 Hypothyroidism, unspecified: Secondary | ICD-10-CM | POA: Diagnosis not present

## 2023-06-26 DIAGNOSIS — K592 Neurogenic bowel, not elsewhere classified: Secondary | ICD-10-CM | POA: Diagnosis not present

## 2023-06-27 DIAGNOSIS — R293 Abnormal posture: Secondary | ICD-10-CM | POA: Diagnosis not present

## 2023-06-27 DIAGNOSIS — G8194 Hemiplegia, unspecified affecting left nondominant side: Secondary | ICD-10-CM | POA: Diagnosis not present

## 2023-06-27 DIAGNOSIS — L84 Corns and callosities: Secondary | ICD-10-CM | POA: Diagnosis not present

## 2023-06-27 DIAGNOSIS — E1151 Type 2 diabetes mellitus with diabetic peripheral angiopathy without gangrene: Secondary | ICD-10-CM | POA: Diagnosis not present

## 2023-06-27 DIAGNOSIS — L603 Nail dystrophy: Secondary | ICD-10-CM | POA: Diagnosis not present

## 2023-06-27 DIAGNOSIS — R279 Unspecified lack of coordination: Secondary | ICD-10-CM | POA: Diagnosis not present

## 2023-06-27 DIAGNOSIS — Z7984 Long term (current) use of oral hypoglycemic drugs: Secondary | ICD-10-CM | POA: Diagnosis not present

## 2023-06-27 DIAGNOSIS — L602 Onychogryphosis: Secondary | ICD-10-CM | POA: Diagnosis not present

## 2023-06-27 DIAGNOSIS — M6281 Muscle weakness (generalized): Secondary | ICD-10-CM | POA: Diagnosis not present

## 2023-06-30 DIAGNOSIS — G8194 Hemiplegia, unspecified affecting left nondominant side: Secondary | ICD-10-CM | POA: Diagnosis not present

## 2023-06-30 DIAGNOSIS — G5793 Unspecified mononeuropathy of bilateral lower limbs: Secondary | ICD-10-CM | POA: Diagnosis not present

## 2023-06-30 DIAGNOSIS — M6281 Muscle weakness (generalized): Secondary | ICD-10-CM | POA: Diagnosis not present

## 2023-06-30 DIAGNOSIS — M4714 Other spondylosis with myelopathy, thoracic region: Secondary | ICD-10-CM | POA: Diagnosis not present

## 2023-06-30 DIAGNOSIS — G894 Chronic pain syndrome: Secondary | ICD-10-CM | POA: Diagnosis not present

## 2023-06-30 DIAGNOSIS — R293 Abnormal posture: Secondary | ICD-10-CM | POA: Diagnosis not present

## 2023-06-30 DIAGNOSIS — R279 Unspecified lack of coordination: Secondary | ICD-10-CM | POA: Diagnosis not present

## 2023-07-01 DIAGNOSIS — L89154 Pressure ulcer of sacral region, stage 4: Secondary | ICD-10-CM | POA: Diagnosis not present

## 2023-07-02 DIAGNOSIS — M6281 Muscle weakness (generalized): Secondary | ICD-10-CM | POA: Diagnosis not present

## 2023-07-02 DIAGNOSIS — G8194 Hemiplegia, unspecified affecting left nondominant side: Secondary | ICD-10-CM | POA: Diagnosis not present

## 2023-07-02 DIAGNOSIS — R293 Abnormal posture: Secondary | ICD-10-CM | POA: Diagnosis not present

## 2023-07-03 DIAGNOSIS — M6281 Muscle weakness (generalized): Secondary | ICD-10-CM | POA: Diagnosis not present

## 2023-07-03 DIAGNOSIS — G8194 Hemiplegia, unspecified affecting left nondominant side: Secondary | ICD-10-CM | POA: Diagnosis not present

## 2023-07-03 DIAGNOSIS — R293 Abnormal posture: Secondary | ICD-10-CM | POA: Diagnosis not present

## 2023-07-04 DIAGNOSIS — G8194 Hemiplegia, unspecified affecting left nondominant side: Secondary | ICD-10-CM | POA: Diagnosis not present

## 2023-07-04 DIAGNOSIS — R293 Abnormal posture: Secondary | ICD-10-CM | POA: Diagnosis not present

## 2023-07-04 DIAGNOSIS — M6281 Muscle weakness (generalized): Secondary | ICD-10-CM | POA: Diagnosis not present

## 2023-07-08 DIAGNOSIS — L89154 Pressure ulcer of sacral region, stage 4: Secondary | ICD-10-CM | POA: Diagnosis not present

## 2023-07-15 DIAGNOSIS — L89154 Pressure ulcer of sacral region, stage 4: Secondary | ICD-10-CM | POA: Diagnosis not present

## 2023-07-17 DIAGNOSIS — D649 Anemia, unspecified: Secondary | ICD-10-CM | POA: Diagnosis not present

## 2023-07-17 DIAGNOSIS — E785 Hyperlipidemia, unspecified: Secondary | ICD-10-CM | POA: Diagnosis not present

## 2023-07-21 DIAGNOSIS — L89154 Pressure ulcer of sacral region, stage 4: Secondary | ICD-10-CM | POA: Diagnosis not present

## 2023-07-22 DIAGNOSIS — L89154 Pressure ulcer of sacral region, stage 4: Secondary | ICD-10-CM | POA: Diagnosis not present

## 2023-07-29 DIAGNOSIS — L89154 Pressure ulcer of sacral region, stage 4: Secondary | ICD-10-CM | POA: Diagnosis not present

## 2023-08-05 DIAGNOSIS — L89154 Pressure ulcer of sacral region, stage 4: Secondary | ICD-10-CM | POA: Diagnosis not present

## 2023-08-12 DIAGNOSIS — L89154 Pressure ulcer of sacral region, stage 4: Secondary | ICD-10-CM | POA: Diagnosis not present

## 2023-08-14 DIAGNOSIS — H47233 Glaucomatous optic atrophy, bilateral: Secondary | ICD-10-CM | POA: Diagnosis not present

## 2023-08-14 DIAGNOSIS — H2513 Age-related nuclear cataract, bilateral: Secondary | ICD-10-CM | POA: Diagnosis not present

## 2023-08-18 DIAGNOSIS — E119 Type 2 diabetes mellitus without complications: Secondary | ICD-10-CM | POA: Diagnosis not present

## 2023-08-18 DIAGNOSIS — E785 Hyperlipidemia, unspecified: Secondary | ICD-10-CM | POA: Diagnosis not present

## 2023-08-18 DIAGNOSIS — G839 Paralytic syndrome, unspecified: Secondary | ICD-10-CM | POA: Diagnosis not present

## 2023-08-18 DIAGNOSIS — G5793 Unspecified mononeuropathy of bilateral lower limbs: Secondary | ICD-10-CM | POA: Diagnosis not present

## 2023-08-18 DIAGNOSIS — G8220 Paraplegia, unspecified: Secondary | ICD-10-CM | POA: Diagnosis not present

## 2023-08-18 DIAGNOSIS — I82509 Chronic embolism and thrombosis of unspecified deep veins of unspecified lower extremity: Secondary | ICD-10-CM | POA: Diagnosis not present

## 2023-08-18 DIAGNOSIS — Z7901 Long term (current) use of anticoagulants: Secondary | ICD-10-CM | POA: Diagnosis not present

## 2023-08-18 DIAGNOSIS — L89154 Pressure ulcer of sacral region, stage 4: Secondary | ICD-10-CM | POA: Diagnosis not present

## 2023-08-18 DIAGNOSIS — I5040 Unspecified combined systolic (congestive) and diastolic (congestive) heart failure: Secondary | ICD-10-CM | POA: Diagnosis not present

## 2023-08-18 DIAGNOSIS — I110 Hypertensive heart disease with heart failure: Secondary | ICD-10-CM | POA: Diagnosis not present

## 2023-08-18 DIAGNOSIS — Z9049 Acquired absence of other specified parts of digestive tract: Secondary | ICD-10-CM | POA: Diagnosis not present

## 2023-08-19 DIAGNOSIS — L89154 Pressure ulcer of sacral region, stage 4: Secondary | ICD-10-CM | POA: Diagnosis not present

## 2023-08-20 DIAGNOSIS — L89154 Pressure ulcer of sacral region, stage 4: Secondary | ICD-10-CM | POA: Diagnosis not present

## 2023-08-22 DIAGNOSIS — I10 Essential (primary) hypertension: Secondary | ICD-10-CM | POA: Diagnosis not present

## 2023-08-22 DIAGNOSIS — E119 Type 2 diabetes mellitus without complications: Secondary | ICD-10-CM | POA: Diagnosis not present

## 2023-08-26 DIAGNOSIS — L89154 Pressure ulcer of sacral region, stage 4: Secondary | ICD-10-CM | POA: Diagnosis not present

## 2023-09-02 DIAGNOSIS — L89154 Pressure ulcer of sacral region, stage 4: Secondary | ICD-10-CM | POA: Diagnosis not present

## 2023-09-04 DIAGNOSIS — D649 Anemia, unspecified: Secondary | ICD-10-CM | POA: Diagnosis not present

## 2023-09-04 DIAGNOSIS — I2510 Atherosclerotic heart disease of native coronary artery without angina pectoris: Secondary | ICD-10-CM | POA: Diagnosis not present

## 2023-09-09 DIAGNOSIS — L89154 Pressure ulcer of sacral region, stage 4: Secondary | ICD-10-CM | POA: Diagnosis not present

## 2023-09-10 DIAGNOSIS — I10 Essential (primary) hypertension: Secondary | ICD-10-CM | POA: Diagnosis not present

## 2023-09-11 DIAGNOSIS — G8194 Hemiplegia, unspecified affecting left nondominant side: Secondary | ICD-10-CM | POA: Diagnosis not present

## 2023-09-11 DIAGNOSIS — R293 Abnormal posture: Secondary | ICD-10-CM | POA: Diagnosis not present

## 2023-09-12 DIAGNOSIS — R293 Abnormal posture: Secondary | ICD-10-CM | POA: Diagnosis not present

## 2023-09-12 DIAGNOSIS — G8194 Hemiplegia, unspecified affecting left nondominant side: Secondary | ICD-10-CM | POA: Diagnosis not present

## 2023-09-13 DIAGNOSIS — R293 Abnormal posture: Secondary | ICD-10-CM | POA: Diagnosis not present

## 2023-09-13 DIAGNOSIS — G8194 Hemiplegia, unspecified affecting left nondominant side: Secondary | ICD-10-CM | POA: Diagnosis not present

## 2023-09-15 DIAGNOSIS — R293 Abnormal posture: Secondary | ICD-10-CM | POA: Diagnosis not present

## 2023-09-15 DIAGNOSIS — G8194 Hemiplegia, unspecified affecting left nondominant side: Secondary | ICD-10-CM | POA: Diagnosis not present

## 2023-09-16 DIAGNOSIS — L89154 Pressure ulcer of sacral region, stage 4: Secondary | ICD-10-CM | POA: Diagnosis not present

## 2023-09-16 DIAGNOSIS — G8194 Hemiplegia, unspecified affecting left nondominant side: Secondary | ICD-10-CM | POA: Diagnosis not present

## 2023-09-16 DIAGNOSIS — G5793 Unspecified mononeuropathy of bilateral lower limbs: Secondary | ICD-10-CM | POA: Diagnosis not present

## 2023-09-16 DIAGNOSIS — B9562 Methicillin resistant Staphylococcus aureus infection as the cause of diseases classified elsewhere: Secondary | ICD-10-CM | POA: Diagnosis not present

## 2023-09-16 DIAGNOSIS — R293 Abnormal posture: Secondary | ICD-10-CM | POA: Diagnosis not present

## 2023-09-22 DIAGNOSIS — L89154 Pressure ulcer of sacral region, stage 4: Secondary | ICD-10-CM | POA: Diagnosis not present

## 2023-09-23 DIAGNOSIS — L89154 Pressure ulcer of sacral region, stage 4: Secondary | ICD-10-CM | POA: Diagnosis not present

## 2023-09-30 DIAGNOSIS — L89154 Pressure ulcer of sacral region, stage 4: Secondary | ICD-10-CM | POA: Diagnosis not present

## 2023-10-02 DIAGNOSIS — G5793 Unspecified mononeuropathy of bilateral lower limbs: Secondary | ICD-10-CM | POA: Diagnosis not present

## 2023-10-02 DIAGNOSIS — I5040 Unspecified combined systolic (congestive) and diastolic (congestive) heart failure: Secondary | ICD-10-CM | POA: Diagnosis not present

## 2023-10-02 DIAGNOSIS — M4714 Other spondylosis with myelopathy, thoracic region: Secondary | ICD-10-CM | POA: Diagnosis not present

## 2023-10-02 DIAGNOSIS — E119 Type 2 diabetes mellitus without complications: Secondary | ICD-10-CM | POA: Diagnosis not present

## 2023-10-02 DIAGNOSIS — I110 Hypertensive heart disease with heart failure: Secondary | ICD-10-CM | POA: Diagnosis not present

## 2023-10-02 DIAGNOSIS — E039 Hypothyroidism, unspecified: Secondary | ICD-10-CM | POA: Diagnosis not present

## 2023-10-02 DIAGNOSIS — G8220 Paraplegia, unspecified: Secondary | ICD-10-CM | POA: Diagnosis not present

## 2023-10-02 DIAGNOSIS — B9562 Methicillin resistant Staphylococcus aureus infection as the cause of diseases classified elsewhere: Secondary | ICD-10-CM | POA: Diagnosis not present

## 2023-10-02 DIAGNOSIS — E785 Hyperlipidemia, unspecified: Secondary | ICD-10-CM | POA: Diagnosis not present

## 2023-10-02 DIAGNOSIS — G894 Chronic pain syndrome: Secondary | ICD-10-CM | POA: Diagnosis not present

## 2023-10-02 DIAGNOSIS — I82509 Chronic embolism and thrombosis of unspecified deep veins of unspecified lower extremity: Secondary | ICD-10-CM | POA: Diagnosis not present

## 2023-10-06 DIAGNOSIS — H47233 Glaucomatous optic atrophy, bilateral: Secondary | ICD-10-CM | POA: Diagnosis not present

## 2023-10-06 DIAGNOSIS — G5793 Unspecified mononeuropathy of bilateral lower limbs: Secondary | ICD-10-CM | POA: Diagnosis not present

## 2023-10-07 DIAGNOSIS — L89154 Pressure ulcer of sacral region, stage 4: Secondary | ICD-10-CM | POA: Diagnosis not present

## 2023-10-14 DIAGNOSIS — L89154 Pressure ulcer of sacral region, stage 4: Secondary | ICD-10-CM | POA: Diagnosis not present

## 2023-10-21 DIAGNOSIS — L89154 Pressure ulcer of sacral region, stage 4: Secondary | ICD-10-CM | POA: Diagnosis not present

## 2023-10-23 DIAGNOSIS — L89154 Pressure ulcer of sacral region, stage 4: Secondary | ICD-10-CM | POA: Diagnosis not present

## 2023-10-27 DIAGNOSIS — R0982 Postnasal drip: Secondary | ICD-10-CM | POA: Diagnosis not present

## 2023-10-27 DIAGNOSIS — G5793 Unspecified mononeuropathy of bilateral lower limbs: Secondary | ICD-10-CM | POA: Diagnosis not present

## 2023-11-04 DIAGNOSIS — L89154 Pressure ulcer of sacral region, stage 4: Secondary | ICD-10-CM | POA: Diagnosis not present

## 2023-11-11 DIAGNOSIS — L89154 Pressure ulcer of sacral region, stage 4: Secondary | ICD-10-CM | POA: Diagnosis not present

## 2023-11-13 DIAGNOSIS — L89154 Pressure ulcer of sacral region, stage 4: Secondary | ICD-10-CM | POA: Diagnosis not present

## 2023-11-13 DIAGNOSIS — E1151 Type 2 diabetes mellitus with diabetic peripheral angiopathy without gangrene: Secondary | ICD-10-CM | POA: Diagnosis not present

## 2023-11-13 DIAGNOSIS — Z7984 Long term (current) use of oral hypoglycemic drugs: Secondary | ICD-10-CM | POA: Diagnosis not present

## 2023-11-13 DIAGNOSIS — L602 Onychogryphosis: Secondary | ICD-10-CM | POA: Diagnosis not present

## 2023-11-13 DIAGNOSIS — L603 Nail dystrophy: Secondary | ICD-10-CM | POA: Diagnosis not present

## 2023-11-18 DIAGNOSIS — L89154 Pressure ulcer of sacral region, stage 4: Secondary | ICD-10-CM | POA: Diagnosis not present

## 2023-11-25 DIAGNOSIS — L89154 Pressure ulcer of sacral region, stage 4: Secondary | ICD-10-CM | POA: Diagnosis not present

## 2023-12-09 DIAGNOSIS — G8194 Hemiplegia, unspecified affecting left nondominant side: Secondary | ICD-10-CM | POA: Diagnosis not present

## 2023-12-09 DIAGNOSIS — M6281 Muscle weakness (generalized): Secondary | ICD-10-CM | POA: Diagnosis not present

## 2023-12-09 DIAGNOSIS — R293 Abnormal posture: Secondary | ICD-10-CM | POA: Diagnosis not present

## 2023-12-10 DIAGNOSIS — G8194 Hemiplegia, unspecified affecting left nondominant side: Secondary | ICD-10-CM | POA: Diagnosis not present

## 2023-12-10 DIAGNOSIS — M6281 Muscle weakness (generalized): Secondary | ICD-10-CM | POA: Diagnosis not present

## 2023-12-10 DIAGNOSIS — R293 Abnormal posture: Secondary | ICD-10-CM | POA: Diagnosis not present

## 2023-12-11 DIAGNOSIS — G8194 Hemiplegia, unspecified affecting left nondominant side: Secondary | ICD-10-CM | POA: Diagnosis not present

## 2023-12-11 DIAGNOSIS — M6281 Muscle weakness (generalized): Secondary | ICD-10-CM | POA: Diagnosis not present

## 2023-12-11 DIAGNOSIS — R293 Abnormal posture: Secondary | ICD-10-CM | POA: Diagnosis not present

## 2023-12-12 DIAGNOSIS — G8194 Hemiplegia, unspecified affecting left nondominant side: Secondary | ICD-10-CM | POA: Diagnosis not present

## 2023-12-12 DIAGNOSIS — R293 Abnormal posture: Secondary | ICD-10-CM | POA: Diagnosis not present

## 2023-12-12 DIAGNOSIS — M6281 Muscle weakness (generalized): Secondary | ICD-10-CM | POA: Diagnosis not present

## 2023-12-15 DIAGNOSIS — R293 Abnormal posture: Secondary | ICD-10-CM | POA: Diagnosis not present

## 2023-12-15 DIAGNOSIS — G8194 Hemiplegia, unspecified affecting left nondominant side: Secondary | ICD-10-CM | POA: Diagnosis not present

## 2023-12-15 DIAGNOSIS — M6281 Muscle weakness (generalized): Secondary | ICD-10-CM | POA: Diagnosis not present

## 2023-12-16 DIAGNOSIS — M6281 Muscle weakness (generalized): Secondary | ICD-10-CM | POA: Diagnosis not present

## 2023-12-16 DIAGNOSIS — R293 Abnormal posture: Secondary | ICD-10-CM | POA: Diagnosis not present

## 2023-12-16 DIAGNOSIS — G8194 Hemiplegia, unspecified affecting left nondominant side: Secondary | ICD-10-CM | POA: Diagnosis not present

## 2023-12-18 DIAGNOSIS — M6281 Muscle weakness (generalized): Secondary | ICD-10-CM | POA: Diagnosis not present

## 2023-12-18 DIAGNOSIS — R293 Abnormal posture: Secondary | ICD-10-CM | POA: Diagnosis not present

## 2023-12-18 DIAGNOSIS — G8194 Hemiplegia, unspecified affecting left nondominant side: Secondary | ICD-10-CM | POA: Diagnosis not present

## 2023-12-19 DIAGNOSIS — R293 Abnormal posture: Secondary | ICD-10-CM | POA: Diagnosis not present

## 2023-12-19 DIAGNOSIS — M6281 Muscle weakness (generalized): Secondary | ICD-10-CM | POA: Diagnosis not present

## 2023-12-19 DIAGNOSIS — G8194 Hemiplegia, unspecified affecting left nondominant side: Secondary | ICD-10-CM | POA: Diagnosis not present

## 2023-12-22 DIAGNOSIS — R293 Abnormal posture: Secondary | ICD-10-CM | POA: Diagnosis not present

## 2023-12-22 DIAGNOSIS — G8194 Hemiplegia, unspecified affecting left nondominant side: Secondary | ICD-10-CM | POA: Diagnosis not present

## 2023-12-22 DIAGNOSIS — M6281 Muscle weakness (generalized): Secondary | ICD-10-CM | POA: Diagnosis not present

## 2023-12-23 DIAGNOSIS — R293 Abnormal posture: Secondary | ICD-10-CM | POA: Diagnosis not present

## 2023-12-23 DIAGNOSIS — M6281 Muscle weakness (generalized): Secondary | ICD-10-CM | POA: Diagnosis not present

## 2023-12-23 DIAGNOSIS — G8194 Hemiplegia, unspecified affecting left nondominant side: Secondary | ICD-10-CM | POA: Diagnosis not present

## 2023-12-25 DIAGNOSIS — G8194 Hemiplegia, unspecified affecting left nondominant side: Secondary | ICD-10-CM | POA: Diagnosis not present

## 2023-12-25 DIAGNOSIS — M6281 Muscle weakness (generalized): Secondary | ICD-10-CM | POA: Diagnosis not present

## 2023-12-25 DIAGNOSIS — R293 Abnormal posture: Secondary | ICD-10-CM | POA: Diagnosis not present

## 2024-01-01 DIAGNOSIS — G8194 Hemiplegia, unspecified affecting left nondominant side: Secondary | ICD-10-CM | POA: Diagnosis not present

## 2024-01-01 DIAGNOSIS — M6281 Muscle weakness (generalized): Secondary | ICD-10-CM | POA: Diagnosis not present

## 2024-01-01 DIAGNOSIS — R293 Abnormal posture: Secondary | ICD-10-CM | POA: Diagnosis not present

## 2024-01-02 DIAGNOSIS — G8194 Hemiplegia, unspecified affecting left nondominant side: Secondary | ICD-10-CM | POA: Diagnosis not present

## 2024-01-02 DIAGNOSIS — R293 Abnormal posture: Secondary | ICD-10-CM | POA: Diagnosis not present

## 2024-01-02 DIAGNOSIS — M6281 Muscle weakness (generalized): Secondary | ICD-10-CM | POA: Diagnosis not present

## 2024-01-05 DIAGNOSIS — M6281 Muscle weakness (generalized): Secondary | ICD-10-CM | POA: Diagnosis not present

## 2024-01-05 DIAGNOSIS — R293 Abnormal posture: Secondary | ICD-10-CM | POA: Diagnosis not present

## 2024-01-05 DIAGNOSIS — G8194 Hemiplegia, unspecified affecting left nondominant side: Secondary | ICD-10-CM | POA: Diagnosis not present

## 2024-01-08 DIAGNOSIS — M6281 Muscle weakness (generalized): Secondary | ICD-10-CM | POA: Diagnosis not present

## 2024-01-08 DIAGNOSIS — R293 Abnormal posture: Secondary | ICD-10-CM | POA: Diagnosis not present

## 2024-01-08 DIAGNOSIS — G8194 Hemiplegia, unspecified affecting left nondominant side: Secondary | ICD-10-CM | POA: Diagnosis not present

## 2024-01-12 DIAGNOSIS — M6281 Muscle weakness (generalized): Secondary | ICD-10-CM | POA: Diagnosis not present

## 2024-01-12 DIAGNOSIS — G8194 Hemiplegia, unspecified affecting left nondominant side: Secondary | ICD-10-CM | POA: Diagnosis not present

## 2024-01-12 DIAGNOSIS — R293 Abnormal posture: Secondary | ICD-10-CM | POA: Diagnosis not present

## 2024-01-13 DIAGNOSIS — M6281 Muscle weakness (generalized): Secondary | ICD-10-CM | POA: Diagnosis not present

## 2024-01-13 DIAGNOSIS — G8194 Hemiplegia, unspecified affecting left nondominant side: Secondary | ICD-10-CM | POA: Diagnosis not present

## 2024-01-13 DIAGNOSIS — R293 Abnormal posture: Secondary | ICD-10-CM | POA: Diagnosis not present

## 2024-01-14 DIAGNOSIS — M792 Neuralgia and neuritis, unspecified: Secondary | ICD-10-CM | POA: Diagnosis not present

## 2024-01-14 DIAGNOSIS — L602 Onychogryphosis: Secondary | ICD-10-CM | POA: Diagnosis not present

## 2024-01-14 DIAGNOSIS — E039 Hypothyroidism, unspecified: Secondary | ICD-10-CM | POA: Diagnosis not present

## 2024-01-14 DIAGNOSIS — G894 Chronic pain syndrome: Secondary | ICD-10-CM | POA: Diagnosis not present

## 2024-01-14 DIAGNOSIS — E119 Type 2 diabetes mellitus without complications: Secondary | ICD-10-CM | POA: Diagnosis not present

## 2024-01-14 DIAGNOSIS — Z7984 Long term (current) use of oral hypoglycemic drugs: Secondary | ICD-10-CM | POA: Diagnosis not present

## 2024-01-14 DIAGNOSIS — L603 Nail dystrophy: Secondary | ICD-10-CM | POA: Diagnosis not present

## 2024-01-14 DIAGNOSIS — G839 Paralytic syndrome, unspecified: Secondary | ICD-10-CM | POA: Diagnosis not present

## 2024-01-14 DIAGNOSIS — G8220 Paraplegia, unspecified: Secondary | ICD-10-CM | POA: Diagnosis not present

## 2024-01-14 DIAGNOSIS — E1151 Type 2 diabetes mellitus with diabetic peripheral angiopathy without gangrene: Secondary | ICD-10-CM | POA: Diagnosis not present

## 2024-01-15 DIAGNOSIS — G8194 Hemiplegia, unspecified affecting left nondominant side: Secondary | ICD-10-CM | POA: Diagnosis not present

## 2024-01-15 DIAGNOSIS — M6281 Muscle weakness (generalized): Secondary | ICD-10-CM | POA: Diagnosis not present

## 2024-01-15 DIAGNOSIS — R293 Abnormal posture: Secondary | ICD-10-CM | POA: Diagnosis not present

## 2024-01-16 DIAGNOSIS — G8194 Hemiplegia, unspecified affecting left nondominant side: Secondary | ICD-10-CM | POA: Diagnosis not present

## 2024-01-16 DIAGNOSIS — R293 Abnormal posture: Secondary | ICD-10-CM | POA: Diagnosis not present

## 2024-01-16 DIAGNOSIS — M6281 Muscle weakness (generalized): Secondary | ICD-10-CM | POA: Diagnosis not present

## 2024-01-19 DIAGNOSIS — G8194 Hemiplegia, unspecified affecting left nondominant side: Secondary | ICD-10-CM | POA: Diagnosis not present

## 2024-01-19 DIAGNOSIS — R293 Abnormal posture: Secondary | ICD-10-CM | POA: Diagnosis not present

## 2024-01-19 DIAGNOSIS — M6281 Muscle weakness (generalized): Secondary | ICD-10-CM | POA: Diagnosis not present

## 2024-01-20 DIAGNOSIS — R293 Abnormal posture: Secondary | ICD-10-CM | POA: Diagnosis not present

## 2024-01-20 DIAGNOSIS — G8194 Hemiplegia, unspecified affecting left nondominant side: Secondary | ICD-10-CM | POA: Diagnosis not present

## 2024-01-20 DIAGNOSIS — M6281 Muscle weakness (generalized): Secondary | ICD-10-CM | POA: Diagnosis not present

## 2024-01-23 DIAGNOSIS — R293 Abnormal posture: Secondary | ICD-10-CM | POA: Diagnosis not present

## 2024-01-23 DIAGNOSIS — M6281 Muscle weakness (generalized): Secondary | ICD-10-CM | POA: Diagnosis not present

## 2024-01-23 DIAGNOSIS — G8194 Hemiplegia, unspecified affecting left nondominant side: Secondary | ICD-10-CM | POA: Diagnosis not present

## 2024-01-26 DIAGNOSIS — G8194 Hemiplegia, unspecified affecting left nondominant side: Secondary | ICD-10-CM | POA: Diagnosis not present

## 2024-01-26 DIAGNOSIS — R293 Abnormal posture: Secondary | ICD-10-CM | POA: Diagnosis not present

## 2024-01-26 DIAGNOSIS — M6281 Muscle weakness (generalized): Secondary | ICD-10-CM | POA: Diagnosis not present

## 2024-01-27 DIAGNOSIS — M6281 Muscle weakness (generalized): Secondary | ICD-10-CM | POA: Diagnosis not present

## 2024-01-27 DIAGNOSIS — R293 Abnormal posture: Secondary | ICD-10-CM | POA: Diagnosis not present

## 2024-01-27 DIAGNOSIS — G8194 Hemiplegia, unspecified affecting left nondominant side: Secondary | ICD-10-CM | POA: Diagnosis not present

## 2024-01-28 DIAGNOSIS — R293 Abnormal posture: Secondary | ICD-10-CM | POA: Diagnosis not present

## 2024-01-28 DIAGNOSIS — M6281 Muscle weakness (generalized): Secondary | ICD-10-CM | POA: Diagnosis not present

## 2024-01-28 DIAGNOSIS — G8194 Hemiplegia, unspecified affecting left nondominant side: Secondary | ICD-10-CM | POA: Diagnosis not present

## 2024-01-29 DIAGNOSIS — G8194 Hemiplegia, unspecified affecting left nondominant side: Secondary | ICD-10-CM | POA: Diagnosis not present

## 2024-01-29 DIAGNOSIS — R293 Abnormal posture: Secondary | ICD-10-CM | POA: Diagnosis not present

## 2024-01-29 DIAGNOSIS — M6281 Muscle weakness (generalized): Secondary | ICD-10-CM | POA: Diagnosis not present

## 2024-01-30 DIAGNOSIS — M6281 Muscle weakness (generalized): Secondary | ICD-10-CM | POA: Diagnosis not present

## 2024-01-30 DIAGNOSIS — G8194 Hemiplegia, unspecified affecting left nondominant side: Secondary | ICD-10-CM | POA: Diagnosis not present

## 2024-01-30 DIAGNOSIS — R293 Abnormal posture: Secondary | ICD-10-CM | POA: Diagnosis not present

## 2024-02-03 DIAGNOSIS — G8194 Hemiplegia, unspecified affecting left nondominant side: Secondary | ICD-10-CM | POA: Diagnosis not present

## 2024-02-03 DIAGNOSIS — R293 Abnormal posture: Secondary | ICD-10-CM | POA: Diagnosis not present

## 2024-02-03 DIAGNOSIS — M6281 Muscle weakness (generalized): Secondary | ICD-10-CM | POA: Diagnosis not present

## 2024-02-05 DIAGNOSIS — G8194 Hemiplegia, unspecified affecting left nondominant side: Secondary | ICD-10-CM | POA: Diagnosis not present

## 2024-02-05 DIAGNOSIS — R293 Abnormal posture: Secondary | ICD-10-CM | POA: Diagnosis not present

## 2024-02-05 DIAGNOSIS — M6281 Muscle weakness (generalized): Secondary | ICD-10-CM | POA: Diagnosis not present

## 2024-02-07 DIAGNOSIS — M6281 Muscle weakness (generalized): Secondary | ICD-10-CM | POA: Diagnosis not present

## 2024-02-07 DIAGNOSIS — R293 Abnormal posture: Secondary | ICD-10-CM | POA: Diagnosis not present

## 2024-02-09 DIAGNOSIS — I110 Hypertensive heart disease with heart failure: Secondary | ICD-10-CM | POA: Diagnosis not present

## 2024-02-09 DIAGNOSIS — E039 Hypothyroidism, unspecified: Secondary | ICD-10-CM | POA: Diagnosis not present

## 2024-02-09 DIAGNOSIS — K219 Gastro-esophageal reflux disease without esophagitis: Secondary | ICD-10-CM | POA: Diagnosis not present

## 2024-02-09 DIAGNOSIS — Z7901 Long term (current) use of anticoagulants: Secondary | ICD-10-CM | POA: Diagnosis not present

## 2024-02-09 DIAGNOSIS — E785 Hyperlipidemia, unspecified: Secondary | ICD-10-CM | POA: Diagnosis not present

## 2024-02-09 DIAGNOSIS — K592 Neurogenic bowel, not elsewhere classified: Secondary | ICD-10-CM | POA: Diagnosis not present

## 2024-02-09 DIAGNOSIS — G839 Paralytic syndrome, unspecified: Secondary | ICD-10-CM | POA: Diagnosis not present

## 2024-02-09 DIAGNOSIS — E119 Type 2 diabetes mellitus without complications: Secondary | ICD-10-CM | POA: Diagnosis not present

## 2024-02-09 DIAGNOSIS — G8220 Paraplegia, unspecified: Secondary | ICD-10-CM | POA: Diagnosis not present

## 2024-02-09 DIAGNOSIS — M792 Neuralgia and neuritis, unspecified: Secondary | ICD-10-CM | POA: Diagnosis not present

## 2024-02-09 DIAGNOSIS — I82509 Chronic embolism and thrombosis of unspecified deep veins of unspecified lower extremity: Secondary | ICD-10-CM | POA: Diagnosis not present

## 2024-02-12 DIAGNOSIS — Z139 Encounter for screening, unspecified: Secondary | ICD-10-CM | POA: Diagnosis not present

## 2024-02-16 DIAGNOSIS — E119 Type 2 diabetes mellitus without complications: Secondary | ICD-10-CM | POA: Diagnosis not present

## 2024-02-16 DIAGNOSIS — D649 Anemia, unspecified: Secondary | ICD-10-CM | POA: Diagnosis not present

## 2024-03-11 DIAGNOSIS — G8194 Hemiplegia, unspecified affecting left nondominant side: Secondary | ICD-10-CM | POA: Diagnosis not present

## 2024-03-11 DIAGNOSIS — R293 Abnormal posture: Secondary | ICD-10-CM | POA: Diagnosis not present

## 2024-03-11 DIAGNOSIS — M6281 Muscle weakness (generalized): Secondary | ICD-10-CM | POA: Diagnosis not present

## 2024-03-12 DIAGNOSIS — R293 Abnormal posture: Secondary | ICD-10-CM | POA: Diagnosis not present

## 2024-03-12 DIAGNOSIS — M6281 Muscle weakness (generalized): Secondary | ICD-10-CM | POA: Diagnosis not present

## 2024-03-12 DIAGNOSIS — G8194 Hemiplegia, unspecified affecting left nondominant side: Secondary | ICD-10-CM | POA: Diagnosis not present

## 2024-03-13 DIAGNOSIS — R293 Abnormal posture: Secondary | ICD-10-CM | POA: Diagnosis not present

## 2024-03-13 DIAGNOSIS — M6281 Muscle weakness (generalized): Secondary | ICD-10-CM | POA: Diagnosis not present

## 2024-03-15 DIAGNOSIS — M6281 Muscle weakness (generalized): Secondary | ICD-10-CM | POA: Diagnosis not present

## 2024-03-15 DIAGNOSIS — R293 Abnormal posture: Secondary | ICD-10-CM | POA: Diagnosis not present

## 2024-03-16 DIAGNOSIS — R293 Abnormal posture: Secondary | ICD-10-CM | POA: Diagnosis not present

## 2024-03-16 DIAGNOSIS — M6281 Muscle weakness (generalized): Secondary | ICD-10-CM | POA: Diagnosis not present

## 2024-03-16 DIAGNOSIS — G8194 Hemiplegia, unspecified affecting left nondominant side: Secondary | ICD-10-CM | POA: Diagnosis not present

## 2024-03-24 DIAGNOSIS — R293 Abnormal posture: Secondary | ICD-10-CM | POA: Diagnosis not present

## 2024-03-24 DIAGNOSIS — M6281 Muscle weakness (generalized): Secondary | ICD-10-CM | POA: Diagnosis not present

## 2024-03-24 DIAGNOSIS — G8194 Hemiplegia, unspecified affecting left nondominant side: Secondary | ICD-10-CM | POA: Diagnosis not present

## 2024-03-25 DIAGNOSIS — M6281 Muscle weakness (generalized): Secondary | ICD-10-CM | POA: Diagnosis not present

## 2024-03-25 DIAGNOSIS — R293 Abnormal posture: Secondary | ICD-10-CM | POA: Diagnosis not present

## 2024-03-25 DIAGNOSIS — G8194 Hemiplegia, unspecified affecting left nondominant side: Secondary | ICD-10-CM | POA: Diagnosis not present

## 2024-03-26 DIAGNOSIS — R293 Abnormal posture: Secondary | ICD-10-CM | POA: Diagnosis not present

## 2024-03-26 DIAGNOSIS — M6281 Muscle weakness (generalized): Secondary | ICD-10-CM | POA: Diagnosis not present

## 2024-03-29 DIAGNOSIS — R293 Abnormal posture: Secondary | ICD-10-CM | POA: Diagnosis not present

## 2024-03-29 DIAGNOSIS — G8194 Hemiplegia, unspecified affecting left nondominant side: Secondary | ICD-10-CM | POA: Diagnosis not present

## 2024-03-29 DIAGNOSIS — M6281 Muscle weakness (generalized): Secondary | ICD-10-CM | POA: Diagnosis not present

## 2024-03-30 DIAGNOSIS — R293 Abnormal posture: Secondary | ICD-10-CM | POA: Diagnosis not present

## 2024-03-30 DIAGNOSIS — M6281 Muscle weakness (generalized): Secondary | ICD-10-CM | POA: Diagnosis not present

## 2024-03-30 DIAGNOSIS — G8194 Hemiplegia, unspecified affecting left nondominant side: Secondary | ICD-10-CM | POA: Diagnosis not present

## 2024-04-21 DIAGNOSIS — E1169 Type 2 diabetes mellitus with other specified complication: Secondary | ICD-10-CM | POA: Diagnosis not present

## 2024-04-21 DIAGNOSIS — E785 Hyperlipidemia, unspecified: Secondary | ICD-10-CM | POA: Diagnosis not present

## 2024-04-21 DIAGNOSIS — I5040 Unspecified combined systolic (congestive) and diastolic (congestive) heart failure: Secondary | ICD-10-CM | POA: Diagnosis not present

## 2024-04-21 DIAGNOSIS — Z7901 Long term (current) use of anticoagulants: Secondary | ICD-10-CM | POA: Diagnosis not present

## 2024-04-21 DIAGNOSIS — E559 Vitamin D deficiency, unspecified: Secondary | ICD-10-CM | POA: Diagnosis not present

## 2024-04-21 DIAGNOSIS — I82511 Chronic embolism and thrombosis of right femoral vein: Secondary | ICD-10-CM | POA: Diagnosis not present

## 2024-04-21 DIAGNOSIS — E039 Hypothyroidism, unspecified: Secondary | ICD-10-CM | POA: Diagnosis not present

## 2024-04-21 DIAGNOSIS — D649 Anemia, unspecified: Secondary | ICD-10-CM | POA: Diagnosis not present
